# Patient Record
Sex: Female | Born: 1950
Health system: Southern US, Community
[De-identification: ages and names within clinical notes are randomized; demographics above are authoritative.]

## PROBLEM LIST (undated history)

## (undated) DIAGNOSIS — E785 Hyperlipidemia, unspecified: Secondary | ICD-10-CM

## (undated) DIAGNOSIS — E119 Type 2 diabetes mellitus without complications: Secondary | ICD-10-CM

## (undated) DIAGNOSIS — I639 Cerebral infarction, unspecified: Secondary | ICD-10-CM

## (undated) DIAGNOSIS — I8290 Acute embolism and thrombosis of unspecified vein: Secondary | ICD-10-CM

## (undated) DIAGNOSIS — D649 Anemia, unspecified: Secondary | ICD-10-CM

## (undated) DIAGNOSIS — N179 Acute kidney failure, unspecified: Secondary | ICD-10-CM

## (undated) DIAGNOSIS — I10 Essential (primary) hypertension: Secondary | ICD-10-CM

## (undated) DIAGNOSIS — Z905 Acquired absence of kidney: Secondary | ICD-10-CM

## (undated) DIAGNOSIS — J449 Chronic obstructive pulmonary disease, unspecified: Secondary | ICD-10-CM

## (undated) DIAGNOSIS — M1990 Unspecified osteoarthritis, unspecified site: Secondary | ICD-10-CM

## (undated) DIAGNOSIS — Z720 Tobacco use: Secondary | ICD-10-CM

## (undated) DIAGNOSIS — J189 Pneumonia, unspecified organism: Secondary | ICD-10-CM

## (undated) DIAGNOSIS — N289 Disorder of kidney and ureter, unspecified: Secondary | ICD-10-CM

## (undated) DIAGNOSIS — K219 Gastro-esophageal reflux disease without esophagitis: Secondary | ICD-10-CM

## (undated) HISTORY — PX: OTHER SURGICAL HISTORY: SHX169

## (undated) HISTORY — PX: CHOLECYSTECTOMY: SHX55

## (undated) HISTORY — DX: Hyperlipidemia, unspecified: E78.5

## (undated) HISTORY — PX: NEPHRECTOMY RECIPIENT: SUR879

## (undated) HISTORY — PX: ABDOMINAL HYSTERECTOMY: SHX81

## (undated) HISTORY — PX: COLONOSCOPY: SHX174

---

## 1999-09-05 ENCOUNTER — Encounter: Payer: Self-pay | Admitting: Emergency Medicine

## 1999-09-05 ENCOUNTER — Emergency Department (HOSPITAL_COMMUNITY): Admission: EM | Admit: 1999-09-05 | Discharge: 1999-09-05 | Payer: Self-pay | Admitting: Emergency Medicine

## 1999-09-09 ENCOUNTER — Other Ambulatory Visit: Admission: RE | Admit: 1999-09-09 | Discharge: 1999-09-09 | Payer: Self-pay | Admitting: Obstetrics

## 1999-09-10 ENCOUNTER — Other Ambulatory Visit: Admission: RE | Admit: 1999-09-10 | Discharge: 1999-09-10 | Payer: Self-pay | Admitting: Obstetrics

## 1999-10-19 ENCOUNTER — Inpatient Hospital Stay (HOSPITAL_COMMUNITY): Admission: RE | Admit: 1999-10-19 | Discharge: 1999-10-21 | Payer: Self-pay | Admitting: Obstetrics

## 1999-10-25 ENCOUNTER — Inpatient Hospital Stay (HOSPITAL_COMMUNITY): Admission: AD | Admit: 1999-10-25 | Discharge: 1999-10-25 | Payer: Self-pay | Admitting: Obstetrics

## 1999-10-25 ENCOUNTER — Encounter: Payer: Self-pay | Admitting: Obstetrics

## 2002-08-27 ENCOUNTER — Encounter: Admission: RE | Admit: 2002-08-27 | Discharge: 2002-11-25 | Payer: Self-pay | Admitting: Internal Medicine

## 2003-05-29 ENCOUNTER — Ambulatory Visit (HOSPITAL_COMMUNITY): Admission: RE | Admit: 2003-05-29 | Discharge: 2003-05-29 | Payer: Self-pay | Admitting: Orthopedic Surgery

## 2003-05-29 ENCOUNTER — Ambulatory Visit (HOSPITAL_BASED_OUTPATIENT_CLINIC_OR_DEPARTMENT_OTHER): Admission: RE | Admit: 2003-05-29 | Discharge: 2003-05-29 | Payer: Self-pay | Admitting: Orthopedic Surgery

## 2003-07-31 ENCOUNTER — Ambulatory Visit (HOSPITAL_COMMUNITY): Admission: RE | Admit: 2003-07-31 | Discharge: 2003-07-31 | Payer: Self-pay | Admitting: Orthopedic Surgery

## 2003-07-31 ENCOUNTER — Ambulatory Visit (HOSPITAL_BASED_OUTPATIENT_CLINIC_OR_DEPARTMENT_OTHER): Admission: RE | Admit: 2003-07-31 | Discharge: 2003-07-31 | Payer: Self-pay | Admitting: Orthopedic Surgery

## 2003-11-05 ENCOUNTER — Other Ambulatory Visit: Admission: RE | Admit: 2003-11-05 | Discharge: 2003-11-05 | Payer: Self-pay | Admitting: Family Medicine

## 2004-01-16 ENCOUNTER — Encounter: Admission: RE | Admit: 2004-01-16 | Discharge: 2004-01-16 | Payer: Self-pay | Admitting: Gastroenterology

## 2004-04-02 ENCOUNTER — Ambulatory Visit (HOSPITAL_COMMUNITY): Admission: RE | Admit: 2004-04-02 | Discharge: 2004-04-03 | Payer: Self-pay | Admitting: General Surgery

## 2014-03-10 ENCOUNTER — Emergency Department (HOSPITAL_COMMUNITY)
Admission: EM | Admit: 2014-03-10 | Discharge: 2014-03-10 | Disposition: A | Discharge status: Home / Self Care | Payer: Medicare Other | Attending: Emergency Medicine | Admitting: Emergency Medicine

## 2014-03-10 ENCOUNTER — Encounter (HOSPITAL_COMMUNITY): Payer: Self-pay | Admitting: Emergency Medicine

## 2014-03-10 DIAGNOSIS — R944 Abnormal results of kidney function studies: Secondary | ICD-10-CM | POA: Diagnosis not present

## 2014-03-10 DIAGNOSIS — Z862 Personal history of diseases of the blood and blood-forming organs and certain disorders involving the immune mechanism: Secondary | ICD-10-CM | POA: Diagnosis not present

## 2014-03-10 DIAGNOSIS — Z905 Acquired absence of kidney: Secondary | ICD-10-CM | POA: Insufficient documentation

## 2014-03-10 DIAGNOSIS — I129 Hypertensive chronic kidney disease with stage 1 through stage 4 chronic kidney disease, or unspecified chronic kidney disease: Secondary | ICD-10-CM | POA: Insufficient documentation

## 2014-03-10 DIAGNOSIS — R899 Unspecified abnormal finding in specimens from other organs, systems and tissues: Secondary | ICD-10-CM

## 2014-03-10 DIAGNOSIS — Z720 Tobacco use: Secondary | ICD-10-CM | POA: Insufficient documentation

## 2014-03-10 DIAGNOSIS — N189 Chronic kidney disease, unspecified: Secondary | ICD-10-CM | POA: Insufficient documentation

## 2014-03-10 DIAGNOSIS — E119 Type 2 diabetes mellitus without complications: Secondary | ICD-10-CM | POA: Diagnosis not present

## 2014-03-10 DIAGNOSIS — Z8739 Personal history of other diseases of the musculoskeletal system and connective tissue: Secondary | ICD-10-CM | POA: Insufficient documentation

## 2014-03-10 HISTORY — DX: Disorder of kidney and ureter, unspecified: N28.9

## 2014-03-10 HISTORY — DX: Essential (primary) hypertension: I10

## 2014-03-10 HISTORY — DX: Unspecified osteoarthritis, unspecified site: M19.90

## 2014-03-10 HISTORY — DX: Acute kidney failure, unspecified: N17.9

## 2014-03-10 HISTORY — DX: Acute embolism and thrombosis of unspecified vein: I82.90

## 2014-03-10 HISTORY — DX: Acquired absence of kidney: Z90.5

## 2014-03-10 HISTORY — DX: Type 2 diabetes mellitus without complications: E11.9

## 2014-03-10 HISTORY — DX: Hypercalcemia: E83.52

## 2014-03-10 LAB — CBC WITH DIFFERENTIAL/PLATELET
Basophils Absolute: 0 10*3/uL (ref 0.0–0.1)
Basophils Relative: 0 % (ref 0–1)
Eosinophils Absolute: 0.2 10*3/uL (ref 0.0–0.7)
Eosinophils Relative: 2 % (ref 0–5)
HCT: 30.3 % — ABNORMAL LOW (ref 36.0–46.0)
Hemoglobin: 9 g/dL — ABNORMAL LOW (ref 12.0–15.0)
Lymphocytes Relative: 9 % — ABNORMAL LOW (ref 12–46)
Lymphs Abs: 1.1 10*3/uL (ref 0.7–4.0)
MCH: 24.2 pg — ABNORMAL LOW (ref 26.0–34.0)
MCHC: 29.7 g/dL — ABNORMAL LOW (ref 30.0–36.0)
MCV: 81.5 fL (ref 78.0–100.0)
Monocytes Absolute: 0.8 10*3/uL (ref 0.1–1.0)
Monocytes Relative: 7 % (ref 3–12)
Neutro Abs: 10 10*3/uL — ABNORMAL HIGH (ref 1.7–7.7)
Neutrophils Relative %: 82 % — ABNORMAL HIGH (ref 43–77)
Platelets: 326 10*3/uL (ref 150–400)
RBC: 3.72 MIL/uL — ABNORMAL LOW (ref 3.87–5.11)
RDW: 18.4 % — ABNORMAL HIGH (ref 11.5–15.5)
WBC: 12.1 10*3/uL — ABNORMAL HIGH (ref 4.0–10.5)

## 2014-03-10 LAB — BASIC METABOLIC PANEL
Anion gap: 16 — ABNORMAL HIGH (ref 5–15)
BUN: 17 mg/dL (ref 6–23)
CO2: 20 mEq/L (ref 19–32)
Calcium: 10.1 mg/dL (ref 8.4–10.5)
Chloride: 104 mEq/L (ref 96–112)
Creatinine, Ser: 1.11 mg/dL — ABNORMAL HIGH (ref 0.50–1.10)
GFR calc Af Amer: 59 mL/min — ABNORMAL LOW (ref >90)
GFR calc non Af Amer: 51 mL/min — ABNORMAL LOW (ref >90)
Glucose, Bld: 170 mg/dL — ABNORMAL HIGH (ref 70–99)
Potassium: 4.6 mEq/L (ref 3.7–5.3)
Sodium: 140 mEq/L (ref 137–147)

## 2014-03-10 MED ORDER — SODIUM CHLORIDE 0.9 % IV SOLN
INTRAVENOUS | Status: DC
  Administered 2014-03-10: 10 mL/h via INTRAVENOUS

## 2014-03-10 MED ORDER — HYDROCODONE-ACETAMINOPHEN 5-325 MG PO TABS
1.0000 | ORAL_TABLET | Freq: Once | ORAL | Status: AC
  Administered 2014-03-10: 1 via ORAL
  Filled 2014-03-10: qty 1

## 2014-03-10 NOTE — ED Notes (Signed)
Pt's sister called doctor's office and found that potassium was 6.9 and Ionized calcium was 1.38

## 2014-03-10 NOTE — ED Notes (Signed)
Pt states that she is from [place].  Has one kidney, CKD, and htn.  States that her doctor in [place] called Friday and left a message, told her that she needed to go immediately to the ER d/t hyperkalemia.  Pt states that she just got the message this morning.

## 2014-03-10 NOTE — ED Provider Notes (Signed)
CSN: [id number]     Arrival date & time 03/10/14  1026 History   First MD Initiated Contact with Patient 03/10/14 1104     Chief Complaint  Patient presents with  . Abnormal Lab     (Consider location/radiation/quality/duration/timing/severity/associated sxs/prior Treatment) HPI Comments: Patient here complaining of having an abnormal laboratory tests. Was noted to have an elevated potassium at 6.9 according to her. She does have a history of chronic kidney disease. He denies any dyspnea. No chest pain or cough or shortness of breath. Denies any chest pain or abdominal pain. Feels at her baseline at this time.  The history is provided by a relative and the patient.    Past Medical History  Diagnosis Date  . Hypertension   . Renal disorder   . Arthritis   . Single kidney   . Hereditary protein C deficiency   . Hypercalcemia   . Acute renal failure   . Diabetes mellitus without complication   . Thrombosis    Past Surgical History  Procedure Laterality Date  . Nephrectomy recipient    . Abdominal hysterectomy    . Cholecystectomy     History reviewed. No pertinent family history. History  Substance Use Topics  . Smoking status: Current Every Day Smoker  . Smokeless tobacco: Not on file  . Alcohol Use: No   OB History   Grav Para Term Preterm Abortions TAB SAB Ect Mult Living                 Review of Systems  All other systems reviewed and are negative.     Allergies  Review of patient's allergies indicates no known allergies.  Home Medications   Prior to Admission medications   Not on File   BP 186/70  Pulse 81  Temp(Src) 99 F (37.2 C) (Oral)  Resp 18  SpO2 100% Physical Exam  Nursing note and vitals reviewed. Constitutional: She is oriented to person, place, and time. She appears well-developed and well-nourished.  Non-toxic appearance. No distress.  HENT:  Head: Normocephalic and atraumatic.  Eyes: Conjunctivae, EOM and lids are normal. Pupils  are equal, round, and reactive to light.  Neck: Normal range of motion. Neck supple. No tracheal deviation present. No mass present.  Cardiovascular: Normal rate, regular rhythm and normal heart sounds.  Exam reveals no gallop.   No murmur heard. Pulmonary/Chest: Effort normal and breath sounds normal. No stridor. No respiratory distress. She has no decreased breath sounds. She has no wheezes. She has no rhonchi. She has no rales.  Abdominal: Soft. Normal appearance and bowel sounds are normal. She exhibits no distension. There is no tenderness. There is no rebound and no CVA tenderness.  Musculoskeletal: Normal range of motion. She exhibits no edema and no tenderness.  Neurological: She is alert and oriented to person, place, and time. She has normal strength. No cranial nerve deficit or sensory deficit. GCS eye subscore is 4. GCS verbal subscore is 5. GCS motor subscore is 6.  Skin: Skin is warm and dry. No abrasion and no rash noted.  Psychiatric: She has a normal mood and affect. Her speech is normal and behavior is normal.    ED Course  Procedures (including critical care time) Labs Review Labs Reviewed  CBC WITH DIFFERENTIAL  BASIC METABOLIC PANEL    Imaging Review No results found.   EKG Interpretation   Date/Time:  Monday March 10 2014 11:24:21 EDT Ventricular Rate:  75 PR Interval:  114 QRS Duration: 82  QT Interval:  418 QTC Calculation: 467 R Axis:   49 Text Interpretation:  Sinus rhythm Borderline short PR interval Probable  anteroseptal infarct, recent No significant change since last tracing  Confirmed by Arney Mayabb  MD, Zosia Lucchese ([id number]) on 03/10/2014 11:35:46 AM      MDM   Final diagnoses:  None   Patient's potassium here is normal. Creatinine is slightly elevated at 1.11. She is asymptomatic at this time. Stable for discharge     Leota Jacobsen, MD 03/10/14 1418

## 2014-03-10 NOTE — Discharge Instructions (Signed)
Followup with your doctor as needed

## 2015-06-02 DIAGNOSIS — M461 Sacroiliitis, not elsewhere classified: Secondary | ICD-10-CM | POA: Diagnosis not present

## 2015-06-02 DIAGNOSIS — Z79891 Long term (current) use of opiate analgesic: Secondary | ICD-10-CM | POA: Diagnosis not present

## 2015-06-02 DIAGNOSIS — E7800 Pure hypercholesterolemia, unspecified: Secondary | ICD-10-CM | POA: Diagnosis not present

## 2015-06-02 DIAGNOSIS — Z8679 Personal history of other diseases of the circulatory system: Secondary | ICD-10-CM | POA: Diagnosis not present

## 2015-06-02 DIAGNOSIS — I5189 Other ill-defined heart diseases: Secondary | ICD-10-CM | POA: Diagnosis not present

## 2015-06-03 DIAGNOSIS — Z87891 Personal history of nicotine dependence: Secondary | ICD-10-CM | POA: Diagnosis not present

## 2015-06-03 DIAGNOSIS — D509 Iron deficiency anemia, unspecified: Secondary | ICD-10-CM | POA: Diagnosis not present

## 2015-06-03 DIAGNOSIS — D6859 Other primary thrombophilia: Secondary | ICD-10-CM | POA: Diagnosis not present

## 2015-06-05 DIAGNOSIS — D6859 Other primary thrombophilia: Secondary | ICD-10-CM | POA: Diagnosis not present

## 2015-06-05 DIAGNOSIS — N183 Chronic kidney disease, stage 3 (moderate): Secondary | ICD-10-CM | POA: Diagnosis not present

## 2015-06-05 DIAGNOSIS — D649 Anemia, unspecified: Secondary | ICD-10-CM | POA: Diagnosis not present

## 2015-06-05 DIAGNOSIS — I129 Hypertensive chronic kidney disease with stage 1 through stage 4 chronic kidney disease, or unspecified chronic kidney disease: Secondary | ICD-10-CM | POA: Diagnosis not present

## 2015-06-05 DIAGNOSIS — Z86718 Personal history of other venous thrombosis and embolism: Secondary | ICD-10-CM | POA: Diagnosis not present

## 2015-06-05 DIAGNOSIS — E1129 Type 2 diabetes mellitus with other diabetic kidney complication: Secondary | ICD-10-CM | POA: Diagnosis not present

## 2015-06-19 DIAGNOSIS — E119 Type 2 diabetes mellitus without complications: Secondary | ICD-10-CM | POA: Diagnosis not present

## 2015-06-19 DIAGNOSIS — Z7984 Long term (current) use of oral hypoglycemic drugs: Secondary | ICD-10-CM | POA: Diagnosis not present

## 2015-06-19 DIAGNOSIS — H26493 Other secondary cataract, bilateral: Secondary | ICD-10-CM | POA: Diagnosis not present

## 2015-06-19 DIAGNOSIS — Z961 Presence of intraocular lens: Secondary | ICD-10-CM | POA: Diagnosis not present

## 2015-07-01 DIAGNOSIS — E0789 Other specified disorders of thyroid: Secondary | ICD-10-CM | POA: Diagnosis not present

## 2015-07-01 DIAGNOSIS — E7800 Pure hypercholesterolemia, unspecified: Secondary | ICD-10-CM | POA: Diagnosis not present

## 2015-07-01 DIAGNOSIS — I5189 Other ill-defined heart diseases: Secondary | ICD-10-CM | POA: Diagnosis not present

## 2015-07-01 DIAGNOSIS — Z79891 Long term (current) use of opiate analgesic: Secondary | ICD-10-CM | POA: Diagnosis not present

## 2015-07-01 DIAGNOSIS — M461 Sacroiliitis, not elsewhere classified: Secondary | ICD-10-CM | POA: Diagnosis not present

## 2015-07-22 DIAGNOSIS — I70213 Atherosclerosis of native arteries of extremities with intermittent claudication, bilateral legs: Secondary | ICD-10-CM | POA: Diagnosis not present

## 2015-07-22 DIAGNOSIS — I70209 Unspecified atherosclerosis of native arteries of extremities, unspecified extremity: Secondary | ICD-10-CM | POA: Diagnosis not present

## 2015-07-22 DIAGNOSIS — I70203 Unspecified atherosclerosis of native arteries of extremities, bilateral legs: Secondary | ICD-10-CM | POA: Diagnosis not present

## 2015-07-29 DIAGNOSIS — M545 Low back pain: Secondary | ICD-10-CM | POA: Diagnosis not present

## 2015-07-29 DIAGNOSIS — M47816 Spondylosis without myelopathy or radiculopathy, lumbar region: Secondary | ICD-10-CM | POA: Diagnosis not present

## 2015-07-29 DIAGNOSIS — Z79891 Long term (current) use of opiate analgesic: Secondary | ICD-10-CM | POA: Diagnosis not present

## 2015-08-05 DIAGNOSIS — E1149 Type 2 diabetes mellitus with other diabetic neurological complication: Secondary | ICD-10-CM | POA: Diagnosis not present

## 2015-08-05 DIAGNOSIS — Z713 Dietary counseling and surveillance: Secondary | ICD-10-CM | POA: Diagnosis not present

## 2015-08-05 DIAGNOSIS — E782 Mixed hyperlipidemia: Secondary | ICD-10-CM | POA: Diagnosis not present

## 2015-08-05 DIAGNOSIS — Z6831 Body mass index (BMI) 31.0-31.9, adult: Secondary | ICD-10-CM | POA: Diagnosis not present

## 2015-08-26 DIAGNOSIS — M47817 Spondylosis without myelopathy or radiculopathy, lumbosacral region: Secondary | ICD-10-CM | POA: Diagnosis not present

## 2015-08-26 DIAGNOSIS — I5189 Other ill-defined heart diseases: Secondary | ICD-10-CM | POA: Diagnosis not present

## 2015-08-26 DIAGNOSIS — E0789 Other specified disorders of thyroid: Secondary | ICD-10-CM | POA: Diagnosis not present

## 2015-08-26 DIAGNOSIS — E118 Type 2 diabetes mellitus with unspecified complications: Secondary | ICD-10-CM | POA: Diagnosis not present

## 2015-08-26 DIAGNOSIS — M545 Low back pain: Secondary | ICD-10-CM | POA: Diagnosis not present

## 2015-08-26 DIAGNOSIS — M47816 Spondylosis without myelopathy or radiculopathy, lumbar region: Secondary | ICD-10-CM | POA: Diagnosis not present

## 2015-08-26 DIAGNOSIS — E7800 Pure hypercholesterolemia, unspecified: Secondary | ICD-10-CM | POA: Diagnosis not present

## 2015-10-28 DIAGNOSIS — Z79891 Long term (current) use of opiate analgesic: Secondary | ICD-10-CM | POA: Diagnosis not present

## 2015-10-28 DIAGNOSIS — M545 Low back pain: Secondary | ICD-10-CM | POA: Diagnosis not present

## 2015-10-28 DIAGNOSIS — M47897 Other spondylosis, lumbosacral region: Secondary | ICD-10-CM | POA: Diagnosis not present

## 2015-10-28 DIAGNOSIS — M461 Sacroiliitis, not elsewhere classified: Secondary | ICD-10-CM | POA: Diagnosis not present

## 2015-10-29 DIAGNOSIS — I701 Atherosclerosis of renal artery: Secondary | ICD-10-CM | POA: Diagnosis not present

## 2015-10-29 DIAGNOSIS — N280 Ischemia and infarction of kidney: Secondary | ICD-10-CM | POA: Diagnosis not present

## 2015-10-29 DIAGNOSIS — I70203 Unspecified atherosclerosis of native arteries of extremities, bilateral legs: Secondary | ICD-10-CM | POA: Diagnosis not present

## 2015-10-30 DIAGNOSIS — E1122 Type 2 diabetes mellitus with diabetic chronic kidney disease: Secondary | ICD-10-CM | POA: Diagnosis present

## 2015-10-30 DIAGNOSIS — R0789 Other chest pain: Secondary | ICD-10-CM | POA: Diagnosis not present

## 2015-10-30 DIAGNOSIS — Z716 Tobacco abuse counseling: Secondary | ICD-10-CM | POA: Diagnosis not present

## 2015-10-30 DIAGNOSIS — F17210 Nicotine dependence, cigarettes, uncomplicated: Secondary | ICD-10-CM | POA: Diagnosis present

## 2015-10-30 DIAGNOSIS — R209 Unspecified disturbances of skin sensation: Secondary | ICD-10-CM | POA: Diagnosis not present

## 2015-10-30 DIAGNOSIS — D6859 Other primary thrombophilia: Secondary | ICD-10-CM | POA: Diagnosis present

## 2015-10-30 DIAGNOSIS — R200 Anesthesia of skin: Secondary | ICD-10-CM | POA: Diagnosis present

## 2015-10-30 DIAGNOSIS — E782 Mixed hyperlipidemia: Secondary | ICD-10-CM | POA: Diagnosis present

## 2015-10-30 DIAGNOSIS — N183 Chronic kidney disease, stage 3 (moderate): Secondary | ICD-10-CM | POA: Diagnosis present

## 2015-10-30 DIAGNOSIS — Z794 Long term (current) use of insulin: Secondary | ICD-10-CM | POA: Diagnosis not present

## 2015-10-30 DIAGNOSIS — R51 Headache: Secondary | ICD-10-CM | POA: Diagnosis not present

## 2015-10-30 DIAGNOSIS — R9431 Abnormal electrocardiogram [ECG] [EKG]: Secondary | ICD-10-CM | POA: Diagnosis not present

## 2015-10-30 DIAGNOSIS — I361 Nonrheumatic tricuspid (valve) insufficiency: Secondary | ICD-10-CM | POA: Diagnosis not present

## 2015-10-30 DIAGNOSIS — M79602 Pain in left arm: Secondary | ICD-10-CM | POA: Diagnosis present

## 2015-10-30 DIAGNOSIS — I10 Essential (primary) hypertension: Secondary | ICD-10-CM | POA: Diagnosis not present

## 2015-10-30 DIAGNOSIS — M4802 Spinal stenosis, cervical region: Secondary | ICD-10-CM | POA: Diagnosis not present

## 2015-10-30 DIAGNOSIS — R079 Chest pain, unspecified: Secondary | ICD-10-CM | POA: Diagnosis not present

## 2015-10-30 DIAGNOSIS — M545 Low back pain: Secondary | ICD-10-CM | POA: Diagnosis present

## 2015-10-30 DIAGNOSIS — M50321 Other cervical disc degeneration at C4-C5 level: Secondary | ICD-10-CM | POA: Diagnosis not present

## 2015-10-30 DIAGNOSIS — F329 Major depressive disorder, single episode, unspecified: Secondary | ICD-10-CM | POA: Diagnosis present

## 2015-10-30 DIAGNOSIS — Z0000 Encounter for general adult medical examination without abnormal findings: Secondary | ICD-10-CM | POA: Diagnosis not present

## 2015-10-30 DIAGNOSIS — G8929 Other chronic pain: Secondary | ICD-10-CM | POA: Diagnosis present

## 2015-10-30 DIAGNOSIS — Z79899 Other long term (current) drug therapy: Secondary | ICD-10-CM | POA: Diagnosis not present

## 2015-10-30 DIAGNOSIS — E1140 Type 2 diabetes mellitus with diabetic neuropathy, unspecified: Secondary | ICD-10-CM | POA: Diagnosis not present

## 2015-10-30 DIAGNOSIS — I129 Hypertensive chronic kidney disease with stage 1 through stage 4 chronic kidney disease, or unspecified chronic kidney disease: Secondary | ICD-10-CM | POA: Diagnosis present

## 2015-10-31 DIAGNOSIS — R51 Headache: Secondary | ICD-10-CM | POA: Diagnosis not present

## 2015-10-31 DIAGNOSIS — F329 Major depressive disorder, single episode, unspecified: Secondary | ICD-10-CM | POA: Diagnosis not present

## 2015-10-31 DIAGNOSIS — E1122 Type 2 diabetes mellitus with diabetic chronic kidney disease: Secondary | ICD-10-CM | POA: Diagnosis not present

## 2015-10-31 DIAGNOSIS — R0789 Other chest pain: Secondary | ICD-10-CM | POA: Diagnosis not present

## 2015-10-31 DIAGNOSIS — E1140 Type 2 diabetes mellitus with diabetic neuropathy, unspecified: Secondary | ICD-10-CM | POA: Diagnosis not present

## 2015-10-31 DIAGNOSIS — N183 Chronic kidney disease, stage 3 (moderate): Secondary | ICD-10-CM | POA: Diagnosis not present

## 2015-10-31 DIAGNOSIS — D6859 Other primary thrombophilia: Secondary | ICD-10-CM | POA: Diagnosis not present

## 2015-11-04 DIAGNOSIS — D649 Anemia, unspecified: Secondary | ICD-10-CM | POA: Diagnosis not present

## 2015-11-04 DIAGNOSIS — D500 Iron deficiency anemia secondary to blood loss (chronic): Secondary | ICD-10-CM | POA: Diagnosis not present

## 2015-11-04 DIAGNOSIS — I129 Hypertensive chronic kidney disease with stage 1 through stage 4 chronic kidney disease, or unspecified chronic kidney disease: Secondary | ICD-10-CM | POA: Diagnosis not present

## 2015-11-04 DIAGNOSIS — D6859 Other primary thrombophilia: Secondary | ICD-10-CM | POA: Diagnosis not present

## 2015-11-04 DIAGNOSIS — R200 Anesthesia of skin: Secondary | ICD-10-CM | POA: Diagnosis not present

## 2015-11-04 DIAGNOSIS — N183 Chronic kidney disease, stage 3 (moderate): Secondary | ICD-10-CM | POA: Diagnosis not present

## 2015-11-04 DIAGNOSIS — I10 Essential (primary) hypertension: Secondary | ICD-10-CM | POA: Diagnosis not present

## 2015-11-04 DIAGNOSIS — R0789 Other chest pain: Secondary | ICD-10-CM | POA: Diagnosis not present

## 2015-11-04 DIAGNOSIS — E1129 Type 2 diabetes mellitus with other diabetic kidney complication: Secondary | ICD-10-CM | POA: Diagnosis not present

## 2015-11-04 DIAGNOSIS — R079 Chest pain, unspecified: Secondary | ICD-10-CM | POA: Diagnosis not present

## 2015-11-04 DIAGNOSIS — Z1231 Encounter for screening mammogram for malignant neoplasm of breast: Secondary | ICD-10-CM | POA: Diagnosis not present

## 2015-11-04 DIAGNOSIS — Z86718 Personal history of other venous thrombosis and embolism: Secondary | ICD-10-CM | POA: Diagnosis not present

## 2015-11-12 DIAGNOSIS — K2961 Other gastritis with bleeding: Secondary | ICD-10-CM | POA: Diagnosis not present

## 2015-11-12 DIAGNOSIS — I890 Lymphedema, not elsewhere classified: Secondary | ICD-10-CM | POA: Diagnosis not present

## 2015-11-12 DIAGNOSIS — D1330 Benign neoplasm of unspecified part of small intestine: Secondary | ICD-10-CM | POA: Diagnosis not present

## 2015-11-12 DIAGNOSIS — Q2733 Arteriovenous malformation of digestive system vessel: Secondary | ICD-10-CM | POA: Diagnosis not present

## 2015-11-12 DIAGNOSIS — D500 Iron deficiency anemia secondary to blood loss (chronic): Secondary | ICD-10-CM | POA: Diagnosis not present

## 2015-11-12 DIAGNOSIS — R300 Dysuria: Secondary | ICD-10-CM | POA: Diagnosis not present

## 2015-11-18 DIAGNOSIS — Z1231 Encounter for screening mammogram for malignant neoplasm of breast: Secondary | ICD-10-CM | POA: Diagnosis not present

## 2015-11-18 DIAGNOSIS — Z87891 Personal history of nicotine dependence: Secondary | ICD-10-CM | POA: Diagnosis not present

## 2015-11-18 DIAGNOSIS — D509 Iron deficiency anemia, unspecified: Secondary | ICD-10-CM | POA: Diagnosis not present

## 2015-11-18 DIAGNOSIS — D6859 Other primary thrombophilia: Secondary | ICD-10-CM | POA: Diagnosis not present

## 2015-11-25 DIAGNOSIS — D509 Iron deficiency anemia, unspecified: Secondary | ICD-10-CM | POA: Diagnosis not present

## 2015-11-26 DIAGNOSIS — K6389 Other specified diseases of intestine: Secondary | ICD-10-CM | POA: Diagnosis not present

## 2015-11-26 DIAGNOSIS — D500 Iron deficiency anemia secondary to blood loss (chronic): Secondary | ICD-10-CM | POA: Diagnosis not present

## 2015-12-09 DIAGNOSIS — E1149 Type 2 diabetes mellitus with other diabetic neurological complication: Secondary | ICD-10-CM | POA: Diagnosis not present

## 2015-12-09 DIAGNOSIS — E782 Mixed hyperlipidemia: Secondary | ICD-10-CM | POA: Diagnosis not present

## 2015-12-09 DIAGNOSIS — Z713 Dietary counseling and surveillance: Secondary | ICD-10-CM | POA: Diagnosis not present

## 2016-01-04 DIAGNOSIS — M461 Sacroiliitis, not elsewhere classified: Secondary | ICD-10-CM | POA: Diagnosis not present

## 2016-01-04 DIAGNOSIS — M47897 Other spondylosis, lumbosacral region: Secondary | ICD-10-CM | POA: Diagnosis not present

## 2016-01-04 DIAGNOSIS — M5416 Radiculopathy, lumbar region: Secondary | ICD-10-CM | POA: Diagnosis not present

## 2016-01-04 DIAGNOSIS — Z79891 Long term (current) use of opiate analgesic: Secondary | ICD-10-CM | POA: Diagnosis not present

## 2016-01-14 DIAGNOSIS — Z23 Encounter for immunization: Secondary | ICD-10-CM | POA: Diagnosis not present

## 2016-01-14 DIAGNOSIS — M5116 Intervertebral disc disorders with radiculopathy, lumbar region: Secondary | ICD-10-CM | POA: Diagnosis not present

## 2016-01-14 DIAGNOSIS — M4726 Other spondylosis with radiculopathy, lumbar region: Secondary | ICD-10-CM | POA: Diagnosis not present

## 2016-01-14 DIAGNOSIS — M5416 Radiculopathy, lumbar region: Secondary | ICD-10-CM | POA: Diagnosis not present

## 2016-01-26 DIAGNOSIS — N183 Chronic kidney disease, stage 3 (moderate): Secondary | ICD-10-CM | POA: Diagnosis not present

## 2016-01-27 DIAGNOSIS — M5136 Other intervertebral disc degeneration, lumbar region: Secondary | ICD-10-CM | POA: Diagnosis not present

## 2016-01-27 DIAGNOSIS — M4806 Spinal stenosis, lumbar region: Secondary | ICD-10-CM | POA: Diagnosis not present

## 2016-01-27 DIAGNOSIS — I70213 Atherosclerosis of native arteries of extremities with intermittent claudication, bilateral legs: Secondary | ICD-10-CM | POA: Diagnosis not present

## 2016-01-27 DIAGNOSIS — M5416 Radiculopathy, lumbar region: Secondary | ICD-10-CM | POA: Diagnosis not present

## 2016-01-27 DIAGNOSIS — Z79891 Long term (current) use of opiate analgesic: Secondary | ICD-10-CM | POA: Diagnosis not present

## 2016-01-29 DIAGNOSIS — N183 Chronic kidney disease, stage 3 (moderate): Secondary | ICD-10-CM | POA: Diagnosis not present

## 2016-01-29 DIAGNOSIS — I129 Hypertensive chronic kidney disease with stage 1 through stage 4 chronic kidney disease, or unspecified chronic kidney disease: Secondary | ICD-10-CM | POA: Diagnosis not present

## 2016-01-29 DIAGNOSIS — D6859 Other primary thrombophilia: Secondary | ICD-10-CM | POA: Diagnosis not present

## 2016-01-29 DIAGNOSIS — E1129 Type 2 diabetes mellitus with other diabetic kidney complication: Secondary | ICD-10-CM | POA: Diagnosis not present

## 2016-01-29 DIAGNOSIS — D649 Anemia, unspecified: Secondary | ICD-10-CM | POA: Diagnosis not present

## 2016-01-29 DIAGNOSIS — Z86718 Personal history of other venous thrombosis and embolism: Secondary | ICD-10-CM | POA: Diagnosis not present

## 2016-02-09 DIAGNOSIS — K219 Gastro-esophageal reflux disease without esophagitis: Secondary | ICD-10-CM | POA: Diagnosis not present

## 2016-02-09 DIAGNOSIS — Z905 Acquired absence of kidney: Secondary | ICD-10-CM | POA: Diagnosis not present

## 2016-02-09 DIAGNOSIS — G8929 Other chronic pain: Secondary | ICD-10-CM | POA: Diagnosis not present

## 2016-02-09 DIAGNOSIS — Z79891 Long term (current) use of opiate analgesic: Secondary | ICD-10-CM | POA: Diagnosis not present

## 2016-02-09 DIAGNOSIS — K31819 Angiodysplasia of stomach and duodenum without bleeding: Secondary | ICD-10-CM | POA: Diagnosis not present

## 2016-02-09 DIAGNOSIS — I129 Hypertensive chronic kidney disease with stage 1 through stage 4 chronic kidney disease, or unspecified chronic kidney disease: Secondary | ICD-10-CM | POA: Diagnosis not present

## 2016-02-09 DIAGNOSIS — Z79899 Other long term (current) drug therapy: Secondary | ICD-10-CM | POA: Diagnosis not present

## 2016-02-09 DIAGNOSIS — Z6831 Body mass index (BMI) 31.0-31.9, adult: Secondary | ICD-10-CM | POA: Diagnosis not present

## 2016-02-09 DIAGNOSIS — E1142 Type 2 diabetes mellitus with diabetic polyneuropathy: Secondary | ICD-10-CM | POA: Diagnosis not present

## 2016-02-09 DIAGNOSIS — N189 Chronic kidney disease, unspecified: Secondary | ICD-10-CM | POA: Diagnosis not present

## 2016-02-09 DIAGNOSIS — Z794 Long term (current) use of insulin: Secondary | ICD-10-CM | POA: Diagnosis not present

## 2016-02-09 DIAGNOSIS — Z91041 Radiographic dye allergy status: Secondary | ICD-10-CM | POA: Diagnosis not present

## 2016-02-09 DIAGNOSIS — Q2733 Arteriovenous malformation of digestive system vessel: Secondary | ICD-10-CM | POA: Diagnosis not present

## 2016-02-09 DIAGNOSIS — Z8679 Personal history of other diseases of the circulatory system: Secondary | ICD-10-CM | POA: Diagnosis not present

## 2016-02-09 DIAGNOSIS — D649 Anemia, unspecified: Secondary | ICD-10-CM | POA: Diagnosis not present

## 2016-02-09 DIAGNOSIS — I739 Peripheral vascular disease, unspecified: Secondary | ICD-10-CM | POA: Diagnosis not present

## 2016-02-09 DIAGNOSIS — M4800 Spinal stenosis, site unspecified: Secondary | ICD-10-CM | POA: Diagnosis not present

## 2016-02-09 DIAGNOSIS — E1122 Type 2 diabetes mellitus with diabetic chronic kidney disease: Secondary | ICD-10-CM | POA: Diagnosis not present

## 2016-02-09 DIAGNOSIS — D509 Iron deficiency anemia, unspecified: Secondary | ICD-10-CM | POA: Diagnosis not present

## 2016-02-09 DIAGNOSIS — D6859 Other primary thrombophilia: Secondary | ICD-10-CM | POA: Diagnosis not present

## 2016-03-22 DIAGNOSIS — M545 Low back pain: Secondary | ICD-10-CM | POA: Diagnosis not present

## 2016-03-22 DIAGNOSIS — M5416 Radiculopathy, lumbar region: Secondary | ICD-10-CM | POA: Diagnosis not present

## 2016-03-22 DIAGNOSIS — Z79891 Long term (current) use of opiate analgesic: Secondary | ICD-10-CM | POA: Diagnosis not present

## 2016-03-22 DIAGNOSIS — M5417 Radiculopathy, lumbosacral region: Secondary | ICD-10-CM | POA: Diagnosis not present

## 2016-04-26 DIAGNOSIS — I70213 Atherosclerosis of native arteries of extremities with intermittent claudication, bilateral legs: Secondary | ICD-10-CM | POA: Diagnosis not present

## 2016-05-04 DIAGNOSIS — D649 Anemia, unspecified: Secondary | ICD-10-CM | POA: Diagnosis not present

## 2016-05-04 DIAGNOSIS — Z87891 Personal history of nicotine dependence: Secondary | ICD-10-CM | POA: Diagnosis not present

## 2016-05-04 DIAGNOSIS — D6859 Other primary thrombophilia: Secondary | ICD-10-CM | POA: Diagnosis not present

## 2016-05-04 DIAGNOSIS — D509 Iron deficiency anemia, unspecified: Secondary | ICD-10-CM | POA: Diagnosis not present

## 2016-05-13 DIAGNOSIS — M545 Low back pain: Secondary | ICD-10-CM | POA: Diagnosis not present

## 2016-05-13 DIAGNOSIS — Z79891 Long term (current) use of opiate analgesic: Secondary | ICD-10-CM | POA: Diagnosis not present

## 2016-05-13 DIAGNOSIS — M5417 Radiculopathy, lumbosacral region: Secondary | ICD-10-CM | POA: Diagnosis not present

## 2016-05-19 DIAGNOSIS — D509 Iron deficiency anemia, unspecified: Secondary | ICD-10-CM | POA: Diagnosis not present

## 2016-06-07 DIAGNOSIS — N183 Chronic kidney disease, stage 3 (moderate): Secondary | ICD-10-CM | POA: Diagnosis not present

## 2016-06-10 DIAGNOSIS — I129 Hypertensive chronic kidney disease with stage 1 through stage 4 chronic kidney disease, or unspecified chronic kidney disease: Secondary | ICD-10-CM | POA: Diagnosis not present

## 2016-06-10 DIAGNOSIS — N183 Chronic kidney disease, stage 3 (moderate): Secondary | ICD-10-CM | POA: Diagnosis not present

## 2016-06-10 DIAGNOSIS — D649 Anemia, unspecified: Secondary | ICD-10-CM | POA: Diagnosis not present

## 2016-06-10 DIAGNOSIS — E1129 Type 2 diabetes mellitus with other diabetic kidney complication: Secondary | ICD-10-CM | POA: Diagnosis not present

## 2016-06-10 DIAGNOSIS — D6859 Other primary thrombophilia: Secondary | ICD-10-CM | POA: Diagnosis not present

## 2016-06-13 DIAGNOSIS — Z79891 Long term (current) use of opiate analgesic: Secondary | ICD-10-CM | POA: Diagnosis not present

## 2016-06-13 DIAGNOSIS — M5417 Radiculopathy, lumbosacral region: Secondary | ICD-10-CM | POA: Diagnosis not present

## 2016-06-13 DIAGNOSIS — M47897 Other spondylosis, lumbosacral region: Secondary | ICD-10-CM | POA: Diagnosis not present

## 2016-06-30 DIAGNOSIS — D6859 Other primary thrombophilia: Secondary | ICD-10-CM | POA: Diagnosis not present

## 2016-06-30 DIAGNOSIS — D649 Anemia, unspecified: Secondary | ICD-10-CM | POA: Diagnosis not present

## 2016-06-30 DIAGNOSIS — Z87891 Personal history of nicotine dependence: Secondary | ICD-10-CM | POA: Diagnosis not present

## 2016-06-30 DIAGNOSIS — D509 Iron deficiency anemia, unspecified: Secondary | ICD-10-CM | POA: Diagnosis not present

## 2016-07-11 DIAGNOSIS — M47897 Other spondylosis, lumbosacral region: Secondary | ICD-10-CM | POA: Diagnosis not present

## 2016-07-11 DIAGNOSIS — M5417 Radiculopathy, lumbosacral region: Secondary | ICD-10-CM | POA: Diagnosis not present

## 2016-07-11 DIAGNOSIS — Z79891 Long term (current) use of opiate analgesic: Secondary | ICD-10-CM | POA: Diagnosis not present

## 2016-08-02 DIAGNOSIS — N183 Chronic kidney disease, stage 3 (moderate): Secondary | ICD-10-CM | POA: Diagnosis not present

## 2016-08-05 DIAGNOSIS — D649 Anemia, unspecified: Secondary | ICD-10-CM | POA: Diagnosis not present

## 2016-08-05 DIAGNOSIS — N179 Acute kidney failure, unspecified: Secondary | ICD-10-CM | POA: Diagnosis not present

## 2016-08-05 DIAGNOSIS — E1129 Type 2 diabetes mellitus with other diabetic kidney complication: Secondary | ICD-10-CM | POA: Diagnosis not present

## 2016-08-05 DIAGNOSIS — I129 Hypertensive chronic kidney disease with stage 1 through stage 4 chronic kidney disease, or unspecified chronic kidney disease: Secondary | ICD-10-CM | POA: Diagnosis not present

## 2016-08-05 DIAGNOSIS — D6859 Other primary thrombophilia: Secondary | ICD-10-CM | POA: Diagnosis not present

## 2016-08-05 DIAGNOSIS — N183 Chronic kidney disease, stage 3 (moderate): Secondary | ICD-10-CM | POA: Diagnosis not present

## 2016-08-08 DIAGNOSIS — Z79891 Long term (current) use of opiate analgesic: Secondary | ICD-10-CM | POA: Diagnosis not present

## 2016-08-08 DIAGNOSIS — M47897 Other spondylosis, lumbosacral region: Secondary | ICD-10-CM | POA: Diagnosis not present

## 2016-08-17 DIAGNOSIS — Z713 Dietary counseling and surveillance: Secondary | ICD-10-CM | POA: Diagnosis not present

## 2016-08-17 DIAGNOSIS — E782 Mixed hyperlipidemia: Secondary | ICD-10-CM | POA: Diagnosis not present

## 2016-08-17 DIAGNOSIS — E1149 Type 2 diabetes mellitus with other diabetic neurological complication: Secondary | ICD-10-CM | POA: Diagnosis not present

## 2016-09-19 DIAGNOSIS — E1140 Type 2 diabetes mellitus with diabetic neuropathy, unspecified: Secondary | ICD-10-CM | POA: Diagnosis not present

## 2016-09-19 DIAGNOSIS — M47897 Other spondylosis, lumbosacral region: Secondary | ICD-10-CM | POA: Diagnosis not present

## 2016-09-28 DIAGNOSIS — Z79891 Long term (current) use of opiate analgesic: Secondary | ICD-10-CM | POA: Diagnosis not present

## 2016-09-28 DIAGNOSIS — M47816 Spondylosis without myelopathy or radiculopathy, lumbar region: Secondary | ICD-10-CM | POA: Diagnosis not present

## 2016-09-28 DIAGNOSIS — F17218 Nicotine dependence, cigarettes, with other nicotine-induced disorders: Secondary | ICD-10-CM | POA: Diagnosis not present

## 2016-09-28 DIAGNOSIS — M5136 Other intervertebral disc degeneration, lumbar region: Secondary | ICD-10-CM | POA: Diagnosis not present

## 2016-09-28 DIAGNOSIS — M47817 Spondylosis without myelopathy or radiculopathy, lumbosacral region: Secondary | ICD-10-CM | POA: Diagnosis not present

## 2016-09-28 DIAGNOSIS — E118 Type 2 diabetes mellitus with unspecified complications: Secondary | ICD-10-CM | POA: Diagnosis not present

## 2016-09-28 DIAGNOSIS — M47897 Other spondylosis, lumbosacral region: Secondary | ICD-10-CM | POA: Diagnosis not present

## 2016-09-28 DIAGNOSIS — I10 Essential (primary) hypertension: Secondary | ICD-10-CM | POA: Diagnosis not present

## 2016-09-28 DIAGNOSIS — K219 Gastro-esophageal reflux disease without esophagitis: Secondary | ICD-10-CM | POA: Diagnosis not present

## 2016-10-05 DIAGNOSIS — D508 Other iron deficiency anemias: Secondary | ICD-10-CM | POA: Diagnosis not present

## 2016-10-05 DIAGNOSIS — Z86010 Personal history of colonic polyps: Secondary | ICD-10-CM

## 2016-10-05 DIAGNOSIS — E1149 Type 2 diabetes mellitus with other diabetic neurological complication: Secondary | ICD-10-CM | POA: Diagnosis not present

## 2016-10-05 DIAGNOSIS — Z0000 Encounter for general adult medical examination without abnormal findings: Secondary | ICD-10-CM | POA: Diagnosis not present

## 2016-10-05 DIAGNOSIS — Z1231 Encounter for screening mammogram for malignant neoplasm of breast: Secondary | ICD-10-CM | POA: Diagnosis not present

## 2016-10-05 DIAGNOSIS — I10 Essential (primary) hypertension: Secondary | ICD-10-CM | POA: Diagnosis not present

## 2016-10-05 DIAGNOSIS — N183 Chronic kidney disease, stage 3 (moderate): Secondary | ICD-10-CM | POA: Diagnosis not present

## 2016-10-05 DIAGNOSIS — E782 Mixed hyperlipidemia: Secondary | ICD-10-CM | POA: Diagnosis not present

## 2016-10-19 DIAGNOSIS — M47817 Spondylosis without myelopathy or radiculopathy, lumbosacral region: Secondary | ICD-10-CM | POA: Diagnosis not present

## 2016-10-19 DIAGNOSIS — M47816 Spondylosis without myelopathy or radiculopathy, lumbar region: Secondary | ICD-10-CM | POA: Diagnosis not present

## 2016-10-31 DIAGNOSIS — D509 Iron deficiency anemia, unspecified: Secondary | ICD-10-CM | POA: Diagnosis not present

## 2016-10-31 DIAGNOSIS — D6859 Other primary thrombophilia: Secondary | ICD-10-CM | POA: Diagnosis not present

## 2016-11-03 DIAGNOSIS — R946 Abnormal results of thyroid function studies: Secondary | ICD-10-CM | POA: Diagnosis not present

## 2016-11-18 DIAGNOSIS — H26493 Other secondary cataract, bilateral: Secondary | ICD-10-CM | POA: Diagnosis not present

## 2016-11-18 DIAGNOSIS — Z7984 Long term (current) use of oral hypoglycemic drugs: Secondary | ICD-10-CM | POA: Diagnosis not present

## 2016-11-18 DIAGNOSIS — E119 Type 2 diabetes mellitus without complications: Secondary | ICD-10-CM | POA: Diagnosis not present

## 2016-11-18 DIAGNOSIS — Z961 Presence of intraocular lens: Secondary | ICD-10-CM | POA: Diagnosis not present

## 2016-11-18 DIAGNOSIS — H524 Presbyopia: Secondary | ICD-10-CM | POA: Diagnosis not present

## 2016-11-21 DIAGNOSIS — E042 Nontoxic multinodular goiter: Secondary | ICD-10-CM | POA: Diagnosis not present

## 2016-11-21 DIAGNOSIS — Z1231 Encounter for screening mammogram for malignant neoplasm of breast: Secondary | ICD-10-CM | POA: Diagnosis not present

## 2016-11-21 DIAGNOSIS — E039 Hypothyroidism, unspecified: Secondary | ICD-10-CM | POA: Diagnosis not present

## 2016-11-22 DIAGNOSIS — D509 Iron deficiency anemia, unspecified: Secondary | ICD-10-CM | POA: Diagnosis not present

## 2016-11-23 DIAGNOSIS — M47817 Spondylosis without myelopathy or radiculopathy, lumbosacral region: Secondary | ICD-10-CM | POA: Diagnosis not present

## 2016-11-23 DIAGNOSIS — Z79891 Long term (current) use of opiate analgesic: Secondary | ICD-10-CM | POA: Diagnosis not present

## 2016-11-23 DIAGNOSIS — M47816 Spondylosis without myelopathy or radiculopathy, lumbar region: Secondary | ICD-10-CM | POA: Diagnosis not present

## 2016-11-29 DIAGNOSIS — D509 Iron deficiency anemia, unspecified: Secondary | ICD-10-CM | POA: Diagnosis not present

## 2016-12-02 DIAGNOSIS — H26491 Other secondary cataract, right eye: Secondary | ICD-10-CM | POA: Diagnosis not present

## 2016-12-02 DIAGNOSIS — Z961 Presence of intraocular lens: Secondary | ICD-10-CM | POA: Diagnosis not present

## 2016-12-05 DIAGNOSIS — Z87891 Personal history of nicotine dependence: Secondary | ICD-10-CM | POA: Diagnosis not present

## 2016-12-05 DIAGNOSIS — D6859 Other primary thrombophilia: Secondary | ICD-10-CM | POA: Diagnosis not present

## 2016-12-05 DIAGNOSIS — D509 Iron deficiency anemia, unspecified: Secondary | ICD-10-CM | POA: Diagnosis not present

## 2016-12-05 DIAGNOSIS — D649 Anemia, unspecified: Secondary | ICD-10-CM | POA: Diagnosis not present

## 2016-12-07 DIAGNOSIS — E039 Hypothyroidism, unspecified: Secondary | ICD-10-CM | POA: Diagnosis not present

## 2016-12-07 DIAGNOSIS — E042 Nontoxic multinodular goiter: Secondary | ICD-10-CM | POA: Diagnosis not present

## 2016-12-09 DIAGNOSIS — Z961 Presence of intraocular lens: Secondary | ICD-10-CM | POA: Diagnosis not present

## 2016-12-09 DIAGNOSIS — H26492 Other secondary cataract, left eye: Secondary | ICD-10-CM | POA: Diagnosis not present

## 2016-12-19 DIAGNOSIS — M545 Low back pain: Secondary | ICD-10-CM | POA: Diagnosis not present

## 2016-12-19 DIAGNOSIS — Z79891 Long term (current) use of opiate analgesic: Secondary | ICD-10-CM | POA: Diagnosis not present

## 2017-03-01 DIAGNOSIS — Z794 Long term (current) use of insulin: Secondary | ICD-10-CM | POA: Diagnosis not present

## 2017-03-01 DIAGNOSIS — I739 Peripheral vascular disease, unspecified: Secondary | ICD-10-CM | POA: Diagnosis not present

## 2017-03-01 DIAGNOSIS — E039 Hypothyroidism, unspecified: Secondary | ICD-10-CM | POA: Diagnosis not present

## 2017-03-01 DIAGNOSIS — F17210 Nicotine dependence, cigarettes, uncomplicated: Secondary | ICD-10-CM | POA: Diagnosis not present

## 2017-03-01 DIAGNOSIS — I8290 Acute embolism and thrombosis of unspecified vein: Secondary | ICD-10-CM | POA: Diagnosis not present

## 2017-03-01 DIAGNOSIS — Q2733 Arteriovenous malformation of digestive system vessel: Secondary | ICD-10-CM | POA: Diagnosis not present

## 2017-03-01 DIAGNOSIS — Z23 Encounter for immunization: Secondary | ICD-10-CM | POA: Diagnosis not present

## 2017-03-01 DIAGNOSIS — I10 Essential (primary) hypertension: Secondary | ICD-10-CM | POA: Diagnosis not present

## 2017-03-01 DIAGNOSIS — E1140 Type 2 diabetes mellitus with diabetic neuropathy, unspecified: Secondary | ICD-10-CM | POA: Diagnosis not present

## 2017-03-01 DIAGNOSIS — G8929 Other chronic pain: Secondary | ICD-10-CM | POA: Diagnosis not present

## 2017-03-01 DIAGNOSIS — Z1389 Encounter for screening for other disorder: Secondary | ICD-10-CM | POA: Diagnosis not present

## 2017-03-01 DIAGNOSIS — D649 Anemia, unspecified: Secondary | ICD-10-CM | POA: Diagnosis not present

## 2017-03-01 DIAGNOSIS — K219 Gastro-esophageal reflux disease without esophagitis: Secondary | ICD-10-CM | POA: Diagnosis not present

## 2017-03-03 ENCOUNTER — Telehealth: Payer: Self-pay | Admitting: Hematology and Oncology

## 2017-03-03 NOTE — Telephone Encounter (Signed)
Appt has been scheduled for the pt to see Dr. Lebron Conners on 10/23 at 2pm. Pt aware to arrive 30 minutes early.

## 2017-03-07 ENCOUNTER — Ambulatory Visit (HOSPITAL_BASED_OUTPATIENT_CLINIC_OR_DEPARTMENT_OTHER): Payer: Medicare Other

## 2017-03-07 ENCOUNTER — Ambulatory Visit (HOSPITAL_BASED_OUTPATIENT_CLINIC_OR_DEPARTMENT_OTHER): Payer: Medicare Other | Admitting: Hematology and Oncology

## 2017-03-07 ENCOUNTER — Telehealth: Payer: Self-pay | Admitting: Hematology and Oncology

## 2017-03-07 VITALS — BP 164/63 | HR 84 | Temp 98.9°F | Resp 18 | Ht 65.5 in | Wt 192.3 lb

## 2017-03-07 DIAGNOSIS — Q2733 Arteriovenous malformation of digestive system vessel: Secondary | ICD-10-CM

## 2017-03-07 DIAGNOSIS — D500 Iron deficiency anemia secondary to blood loss (chronic): Secondary | ICD-10-CM | POA: Diagnosis not present

## 2017-03-07 DIAGNOSIS — K5520 Angiodysplasia of colon without hemorrhage: Secondary | ICD-10-CM | POA: Insufficient documentation

## 2017-03-07 DIAGNOSIS — D688 Other specified coagulation defects: Secondary | ICD-10-CM

## 2017-03-07 DIAGNOSIS — D6851 Activated protein C resistance: Secondary | ICD-10-CM

## 2017-03-07 LAB — CBC & DIFF AND RETIC
BASO%: 0.4 % (ref 0.0–2.0)
Basophils Absolute: 0.1 10*3/uL (ref 0.0–0.1)
EOS%: 1.9 % (ref 0.0–7.0)
Eosinophils Absolute: 0.2 10*3/uL (ref 0.0–0.5)
HCT: 29 % — ABNORMAL LOW (ref 34.8–46.6)
HGB: 8.2 g/dL — ABNORMAL LOW (ref 11.6–15.9)
Immature Retic Fract: 30.8 % — ABNORMAL HIGH (ref 1.60–10.00)
LYMPH%: 9.6 % — ABNORMAL LOW (ref 14.0–49.7)
MCH: 21 pg — ABNORMAL LOW (ref 25.1–34.0)
MCHC: 28.3 g/dL — ABNORMAL LOW (ref 31.5–36.0)
MCV: 74.4 fL — ABNORMAL LOW (ref 79.5–101.0)
MONO#: 0.8 10*3/uL (ref 0.1–0.9)
MONO%: 6.6 % (ref 0.0–14.0)
NEUT#: 9.7 10*3/uL — ABNORMAL HIGH (ref 1.5–6.5)
NEUT%: 81.5 % — ABNORMAL HIGH (ref 38.4–76.8)
Platelets: 254 10*3/uL (ref 145–400)
RBC: 3.9 10*6/uL (ref 3.70–5.45)
RDW: 17.4 % — ABNORMAL HIGH (ref 11.2–14.5)
Retic %: 3.59 % — ABNORMAL HIGH (ref 0.70–2.10)
Retic Ct Abs: 140.01 10*3/uL — ABNORMAL HIGH (ref 33.70–90.70)
WBC: 11.9 10*3/uL — ABNORMAL HIGH (ref 3.9–10.3)
lymph#: 1.1 10*3/uL (ref 0.9–3.3)

## 2017-03-07 LAB — MORPHOLOGY: PLT EST: 240

## 2017-03-07 LAB — COMPREHENSIVE METABOLIC PANEL
ALT: 12 U/L (ref 0–55)
AST: 13 U/L (ref 5–34)
Albumin: 3.8 g/dL (ref 3.5–5.0)
Alkaline Phosphatase: 146 U/L (ref 40–150)
Anion Gap: 12 mEq/L — ABNORMAL HIGH (ref 3–11)
BUN: 16.7 mg/dL (ref 7.0–26.0)
CO2: 23 mEq/L (ref 22–29)
Calcium: 10 mg/dL (ref 8.4–10.4)
Chloride: 104 mEq/L (ref 98–109)
Creatinine: 1.3 mg/dL — ABNORMAL HIGH (ref 0.6–1.1)
EGFR: 48 mL/min/{1.73_m2} — ABNORMAL LOW (ref >60)
Glucose: 218 mg/dl — ABNORMAL HIGH (ref 70–140)
Potassium: 3.9 mEq/L (ref 3.5–5.1)
Sodium: 139 mEq/L (ref 136–145)
Total Bilirubin: 0.25 mg/dL (ref 0.20–1.20)
Total Protein: 7.3 g/dL (ref 6.4–8.3)

## 2017-03-07 NOTE — Telephone Encounter (Signed)
Gave avs and calendar for October  °

## 2017-03-08 LAB — LUPUS ANTICOAGULANT PANEL
PTT-LA: 27.6 s (ref 0.0–51.9)
dRVVT: 32.8 s (ref 0.0–47.0)

## 2017-03-08 LAB — IRON AND TIBC
%SAT: 4 % — ABNORMAL LOW (ref 21–57)
Iron: 18 ug/dL — ABNORMAL LOW (ref 41–142)
TIBC: 453 ug/dL — ABNORMAL HIGH (ref 236–444)
UIBC: 435 ug/dL — ABNORMAL HIGH (ref 120–384)

## 2017-03-08 LAB — FERRITIN: Ferritin: 15 ng/ml (ref 9–269)

## 2017-03-08 LAB — ANTITHROMBIN III: Antithrombin Activity: 127 % (ref 75–135)

## 2017-03-08 LAB — PROTEIN S, TOTAL: Protein S, Total: 92 % (ref 60–150)

## 2017-03-08 LAB — PROTEIN S ACTIVITY: Protein S-Functional: 101 % (ref 63–140)

## 2017-03-08 LAB — PROTEIN S, ANTIGEN, FREE: Protein S, Free: 129 % (ref 57–157)

## 2017-03-08 LAB — VITAMIN B12: Vitamin B12: 783 pg/mL (ref 232–1245)

## 2017-03-08 LAB — PROTEIN C ACTIVITY: Protein C-Functional: 119 % (ref 73–180)

## 2017-03-08 LAB — ANTITHROMBIN III ANTIGEN: AT III AG PPP IMM-ACNC: 106 % (ref 72–124)

## 2017-03-08 LAB — LACTATE DEHYDROGENASE: LDH: 147 U/L (ref 125–245)

## 2017-03-08 LAB — FOLATE: Folate: 9.3 ng/mL (ref >3.0)

## 2017-03-09 LAB — BETA-2-GLYCOPROTEIN I ABS, IGG/M/A
Beta-2 Glyco 1 IgA: <9 GPI IgA units (ref 0–25)
Beta-2 Glyco 1 IgM: <9 GPI IgM units (ref 0–32)
Beta-2 Glycoprotein I Ab, IgG: <9 GPI IgG units (ref 0–20)

## 2017-03-09 LAB — CARDIOLIPIN ANTIBODIES, IGG, IGM, IGA
Anticardiolipin Ab,IgA,Qn: <9 APL U/mL (ref 0–11)
Anticardiolipin Ab,IgG,Qn: <9 GPL U/mL (ref 0–14)
Anticardiolipin Ab,IgM,Qn: <9 MPL U/mL (ref 0–12)

## 2017-03-09 LAB — PROTEIN C, TOTAL: Protein C Antigen: 99 % (ref 60–150)

## 2017-03-13 ENCOUNTER — Encounter (HOSPITAL_COMMUNITY): Payer: Self-pay | Admitting: Emergency Medicine

## 2017-03-13 ENCOUNTER — Inpatient Hospital Stay (HOSPITAL_COMMUNITY)
Admission: EM | Admit: 2017-03-13 | Discharge: 2017-03-17 | DRG: 193 | Disposition: A | Discharge status: Home / Self Care | Payer: Medicare Other | Attending: Internal Medicine | Admitting: Internal Medicine

## 2017-03-13 ENCOUNTER — Emergency Department (HOSPITAL_COMMUNITY): Payer: Medicare Other

## 2017-03-13 ENCOUNTER — Telehealth: Payer: Self-pay

## 2017-03-13 ENCOUNTER — Encounter: Payer: Self-pay | Admitting: Hematology and Oncology

## 2017-03-13 DIAGNOSIS — Z833 Family history of diabetes mellitus: Secondary | ICD-10-CM | POA: Diagnosis not present

## 2017-03-13 DIAGNOSIS — J189 Pneumonia, unspecified organism: Principal | ICD-10-CM

## 2017-03-13 DIAGNOSIS — F329 Major depressive disorder, single episode, unspecified: Secondary | ICD-10-CM | POA: Diagnosis present

## 2017-03-13 DIAGNOSIS — I10 Essential (primary) hypertension: Secondary | ICD-10-CM | POA: Diagnosis present

## 2017-03-13 DIAGNOSIS — J441 Chronic obstructive pulmonary disease with (acute) exacerbation: Secondary | ICD-10-CM | POA: Diagnosis not present

## 2017-03-13 DIAGNOSIS — Z8249 Family history of ischemic heart disease and other diseases of the circulatory system: Secondary | ICD-10-CM | POA: Diagnosis not present

## 2017-03-13 DIAGNOSIS — Z79890 Hormone replacement therapy (postmenopausal): Secondary | ICD-10-CM

## 2017-03-13 DIAGNOSIS — E1149 Type 2 diabetes mellitus with other diabetic neurological complication: Secondary | ICD-10-CM | POA: Diagnosis present

## 2017-03-13 DIAGNOSIS — E782 Mixed hyperlipidemia: Secondary | ICD-10-CM | POA: Diagnosis present

## 2017-03-13 DIAGNOSIS — E1122 Type 2 diabetes mellitus with diabetic chronic kidney disease: Secondary | ICD-10-CM | POA: Diagnosis present

## 2017-03-13 DIAGNOSIS — Z79899 Other long term (current) drug therapy: Secondary | ICD-10-CM | POA: Diagnosis not present

## 2017-03-13 DIAGNOSIS — J449 Chronic obstructive pulmonary disease, unspecified: Secondary | ICD-10-CM

## 2017-03-13 DIAGNOSIS — Q2733 Arteriovenous malformation of digestive system vessel: Secondary | ICD-10-CM | POA: Diagnosis not present

## 2017-03-13 DIAGNOSIS — E039 Hypothyroidism, unspecified: Secondary | ICD-10-CM | POA: Diagnosis present

## 2017-03-13 DIAGNOSIS — Z90710 Acquired absence of both cervix and uterus: Secondary | ICD-10-CM | POA: Diagnosis not present

## 2017-03-13 DIAGNOSIS — Z905 Acquired absence of kidney: Secondary | ICD-10-CM

## 2017-03-13 DIAGNOSIS — G8929 Other chronic pain: Secondary | ICD-10-CM | POA: Diagnosis present

## 2017-03-13 DIAGNOSIS — D500 Iron deficiency anemia secondary to blood loss (chronic): Secondary | ICD-10-CM | POA: Diagnosis present

## 2017-03-13 DIAGNOSIS — D6859 Other primary thrombophilia: Secondary | ICD-10-CM | POA: Diagnosis present

## 2017-03-13 DIAGNOSIS — K5520 Angiodysplasia of colon without hemorrhage: Secondary | ICD-10-CM | POA: Diagnosis present

## 2017-03-13 DIAGNOSIS — R519 Headache, unspecified: Secondary | ICD-10-CM | POA: Diagnosis present

## 2017-03-13 DIAGNOSIS — Z86718 Personal history of other venous thrombosis and embolism: Secondary | ICD-10-CM | POA: Diagnosis not present

## 2017-03-13 DIAGNOSIS — D649 Anemia, unspecified: Secondary | ICD-10-CM

## 2017-03-13 DIAGNOSIS — Z9049 Acquired absence of other specified parts of digestive tract: Secondary | ICD-10-CM

## 2017-03-13 DIAGNOSIS — R05 Cough: Secondary | ICD-10-CM | POA: Diagnosis not present

## 2017-03-13 DIAGNOSIS — M7989 Other specified soft tissue disorders: Secondary | ICD-10-CM | POA: Diagnosis not present

## 2017-03-13 DIAGNOSIS — E118 Type 2 diabetes mellitus with unspecified complications: Secondary | ICD-10-CM | POA: Diagnosis not present

## 2017-03-13 DIAGNOSIS — F17200 Nicotine dependence, unspecified, uncomplicated: Secondary | ICD-10-CM | POA: Diagnosis present

## 2017-03-13 DIAGNOSIS — D72829 Elevated white blood cell count, unspecified: Secondary | ICD-10-CM | POA: Diagnosis not present

## 2017-03-13 DIAGNOSIS — Z794 Long term (current) use of insulin: Secondary | ICD-10-CM

## 2017-03-13 DIAGNOSIS — R0602 Shortness of breath: Secondary | ICD-10-CM | POA: Diagnosis not present

## 2017-03-13 DIAGNOSIS — I129 Hypertensive chronic kidney disease with stage 1 through stage 4 chronic kidney disease, or unspecified chronic kidney disease: Secondary | ICD-10-CM | POA: Diagnosis present

## 2017-03-13 DIAGNOSIS — J9601 Acute respiratory failure with hypoxia: Secondary | ICD-10-CM | POA: Diagnosis present

## 2017-03-13 DIAGNOSIS — J440 Chronic obstructive pulmonary disease with acute lower respiratory infection: Secondary | ICD-10-CM | POA: Diagnosis present

## 2017-03-13 DIAGNOSIS — K219 Gastro-esophageal reflux disease without esophagitis: Secondary | ICD-10-CM | POA: Diagnosis present

## 2017-03-13 DIAGNOSIS — Z91041 Radiographic dye allergy status: Secondary | ICD-10-CM

## 2017-03-13 DIAGNOSIS — N183 Chronic kidney disease, stage 3 unspecified: Secondary | ICD-10-CM | POA: Diagnosis present

## 2017-03-13 DIAGNOSIS — Z86010 Personal history of colonic polyps: Secondary | ICD-10-CM

## 2017-03-13 DIAGNOSIS — R51 Headache: Secondary | ICD-10-CM

## 2017-03-13 DIAGNOSIS — J208 Acute bronchitis due to other specified organisms: Secondary | ICD-10-CM | POA: Diagnosis not present

## 2017-03-13 DIAGNOSIS — T380X5A Adverse effect of glucocorticoids and synthetic analogues, initial encounter: Secondary | ICD-10-CM | POA: Diagnosis not present

## 2017-03-13 DIAGNOSIS — E669 Obesity, unspecified: Secondary | ICD-10-CM | POA: Diagnosis present

## 2017-03-13 DIAGNOSIS — Z6831 Body mass index (BMI) 31.0-31.9, adult: Secondary | ICD-10-CM

## 2017-03-13 DIAGNOSIS — F32A Depression, unspecified: Secondary | ICD-10-CM | POA: Diagnosis present

## 2017-03-13 LAB — COMPREHENSIVE METABOLIC PANEL
ALT: 17 U/L (ref 14–54)
AST: 22 U/L (ref 15–41)
Albumin: 3.4 g/dL — ABNORMAL LOW (ref 3.5–5.0)
Alkaline Phosphatase: 122 U/L (ref 38–126)
Anion gap: 12 (ref 5–15)
BUN: 12 mg/dL (ref 6–20)
CO2: 23 mmol/L (ref 22–32)
Calcium: 9.5 mg/dL (ref 8.9–10.3)
Chloride: 102 mmol/L (ref 101–111)
Creatinine, Ser: 1 mg/dL (ref 0.44–1.00)
GFR calc Af Amer: >60 mL/min (ref >60)
GFR calc non Af Amer: 57 mL/min — ABNORMAL LOW (ref >60)
Glucose, Bld: 179 mg/dL — ABNORMAL HIGH (ref 65–99)
Potassium: 4.1 mmol/L (ref 3.5–5.1)
Sodium: 137 mmol/L (ref 135–145)
Total Bilirubin: 0.4 mg/dL (ref 0.3–1.2)
Total Protein: 6.9 g/dL (ref 6.5–8.1)

## 2017-03-13 LAB — CBC
HCT: 22.9 % — ABNORMAL LOW (ref 36.0–46.0)
Hemoglobin: 6.5 g/dL — CL (ref 12.0–15.0)
MCH: 20.3 pg — ABNORMAL LOW (ref 26.0–34.0)
MCHC: 28.4 g/dL — ABNORMAL LOW (ref 30.0–36.0)
MCV: 71.6 fL — ABNORMAL LOW (ref 78.0–100.0)
Platelets: 279 10*3/uL (ref 150–400)
RBC: 3.2 MIL/uL — ABNORMAL LOW (ref 3.87–5.11)
RDW: 17.8 % — ABNORMAL HIGH (ref 11.5–15.5)
WBC: 9.9 10*3/uL (ref 4.0–10.5)

## 2017-03-13 LAB — CBC WITH DIFFERENTIAL/PLATELET
Basophils Absolute: 0 10*3/uL (ref 0.0–0.1)
Basophils Relative: 0 %
Eosinophils Absolute: 0.4 10*3/uL (ref 0.0–0.7)
Eosinophils Relative: 3 %
HCT: 25.1 % — ABNORMAL LOW (ref 36.0–46.0)
Hemoglobin: 7 g/dL — ABNORMAL LOW (ref 12.0–15.0)
Lymphocytes Relative: 9 %
Lymphs Abs: 1.1 10*3/uL (ref 0.7–4.0)
MCH: 20.1 pg — ABNORMAL LOW (ref 26.0–34.0)
MCHC: 27.9 g/dL — ABNORMAL LOW (ref 30.0–36.0)
MCV: 71.9 fL — ABNORMAL LOW (ref 78.0–100.0)
Monocytes Absolute: 0.9 10*3/uL (ref 0.1–1.0)
Monocytes Relative: 8 %
Neutro Abs: 9.3 10*3/uL — ABNORMAL HIGH (ref 1.7–7.7)
Neutrophils Relative %: 80 %
Platelets: 265 10*3/uL (ref 150–400)
RBC: 3.49 MIL/uL — ABNORMAL LOW (ref 3.87–5.11)
RDW: 17.2 % — ABNORMAL HIGH (ref 11.5–15.5)
WBC: 11.7 10*3/uL — ABNORMAL HIGH (ref 4.0–10.5)

## 2017-03-13 LAB — HEMOGLOBIN A1C
Hgb A1c MFr Bld: 6.6 % — ABNORMAL HIGH (ref 4.8–5.6)
Mean Plasma Glucose: 142.72 mg/dL

## 2017-03-13 LAB — EXPECTORATED SPUTUM ASSESSMENT W GRAM STAIN, RFLX TO RESP C

## 2017-03-13 LAB — CBG MONITORING, ED: Glucose-Capillary: 204 mg/dL — ABNORMAL HIGH (ref 65–99)

## 2017-03-13 LAB — ABO/RH: ABO/RH(D): B POS

## 2017-03-13 LAB — PROCALCITONIN: Procalcitonin: 0.1 ng/mL

## 2017-03-13 LAB — GLUCOSE, CAPILLARY: Glucose-Capillary: 337 mg/dL — ABNORMAL HIGH (ref 65–99)

## 2017-03-13 LAB — TROPONIN I: Troponin I: <0.03 ng/mL (ref <0.03)

## 2017-03-13 LAB — STREP PNEUMONIAE URINARY ANTIGEN: Strep Pneumo Urinary Antigen: NEGATIVE

## 2017-03-13 LAB — LACTIC ACID, PLASMA: Lactic Acid, Venous: 1.7 mmol/L (ref 0.5–1.9)

## 2017-03-13 LAB — PREPARE RBC (CROSSMATCH)

## 2017-03-13 LAB — BRAIN NATRIURETIC PEPTIDE: B Natriuretic Peptide: 211.5 pg/mL — ABNORMAL HIGH (ref 0.0–100.0)

## 2017-03-13 LAB — INFLUENZA PANEL BY PCR (TYPE A & B)
Influenza A By PCR: NEGATIVE
Influenza B By PCR: NEGATIVE

## 2017-03-13 LAB — TSH: TSH: 2.25 u[IU]/mL (ref 0.350–4.500)

## 2017-03-13 LAB — D-DIMER, QUANTITATIVE: D-Dimer, Quant: 0.59 ug/mL-FEU — ABNORMAL HIGH (ref 0.00–0.50)

## 2017-03-13 MED ORDER — INSULIN ASPART 100 UNIT/ML ~~LOC~~ SOLN
0.0000 [IU] | Freq: Every day | SUBCUTANEOUS | Status: DC
  Administered 2017-03-13: 5 [IU] via SUBCUTANEOUS

## 2017-03-13 MED ORDER — INSULIN GLARGINE 100 UNIT/ML ~~LOC~~ SOLN
54.0000 [IU] | Freq: Every day | SUBCUTANEOUS | Status: DC
  Administered 2017-03-13 – 2017-03-14 (×2): 54 [IU] via SUBCUTANEOUS
  Filled 2017-03-13 (×3): qty 0.54

## 2017-03-13 MED ORDER — ALBUTEROL (5 MG/ML) CONTINUOUS INHALATION SOLN
10.0000 mg/h | INHALATION_SOLUTION | Freq: Once | RESPIRATORY_TRACT | Status: AC
  Administered 2017-03-13: 10 mg/h via RESPIRATORY_TRACT
  Filled 2017-03-13: qty 0.5

## 2017-03-13 MED ORDER — BISACODYL 10 MG RE SUPP: 10.0000 mg | Freq: Every day | RECTAL | Status: DC | PRN

## 2017-03-13 MED ORDER — PANTOPRAZOLE SODIUM 40 MG PO TBEC
40.0000 mg | DELAYED_RELEASE_TABLET | Freq: Every day | ORAL | Status: DC
  Administered 2017-03-13 – 2017-03-17 (×5): 40 mg via ORAL
  Filled 2017-03-13 (×5): qty 1

## 2017-03-13 MED ORDER — ONDANSETRON HCL 4 MG PO TABS: 4.0000 mg | ORAL_TABLET | Freq: Four times a day (QID) | ORAL | Status: DC | PRN

## 2017-03-13 MED ORDER — SODIUM CHLORIDE 0.9 % IV SOLN: 10.0000 mL/h | Freq: Once | INTRAVENOUS | Status: DC

## 2017-03-13 MED ORDER — ONDANSETRON HCL 4 MG/2ML IJ SOLN: 4.0000 mg | Freq: Four times a day (QID) | INTRAMUSCULAR | Status: DC | PRN

## 2017-03-13 MED ORDER — DEXTROSE 5 % IV SOLN
1.0000 g | INTRAVENOUS | Status: DC
  Administered 2017-03-13 – 2017-03-16 (×4): 1 g via INTRAVENOUS
  Filled 2017-03-13 (×4): qty 10

## 2017-03-13 MED ORDER — ACETAMINOPHEN 325 MG PO TABS: 650.0000 mg | ORAL_TABLET | Freq: Four times a day (QID) | ORAL | Status: DC | PRN

## 2017-03-13 MED ORDER — HYDROCOD POLST-CPM POLST ER 10-8 MG/5ML PO SUER
5.0000 mL | Freq: Once | ORAL | Status: AC
  Administered 2017-03-13: 5 mL via ORAL
  Filled 2017-03-13: qty 5

## 2017-03-13 MED ORDER — IPRATROPIUM-ALBUTEROL 0.5-2.5 (3) MG/3ML IN SOLN
3.0000 mL | Freq: Two times a day (BID) | RESPIRATORY_TRACT | Status: DC
Start: 2017-03-14 — End: 2017-03-14
  Administered 2017-03-14: 3 mL via RESPIRATORY_TRACT
  Filled 2017-03-13: qty 3

## 2017-03-13 MED ORDER — METHYLPREDNISOLONE SODIUM SUCC 125 MG IJ SOLR
125.0000 mg | Freq: Once | INTRAMUSCULAR | Status: AC
  Administered 2017-03-13: 125 mg via INTRAVENOUS
  Filled 2017-03-13: qty 2

## 2017-03-13 MED ORDER — ACETAMINOPHEN 650 MG RE SUPP: 650.0000 mg | Freq: Four times a day (QID) | RECTAL | Status: DC | PRN

## 2017-03-13 MED ORDER — IPRATROPIUM BROMIDE 0.02 % IN SOLN
0.5000 mg | Freq: Once | RESPIRATORY_TRACT | Status: AC
  Administered 2017-03-13: 0.5 mg via RESPIRATORY_TRACT
  Filled 2017-03-13: qty 2.5

## 2017-03-13 MED ORDER — PREDNISONE 50 MG PO TABS
60.0000 mg | ORAL_TABLET | Freq: Every day | ORAL | Status: DC
  Administered 2017-03-14: 60 mg via ORAL
  Filled 2017-03-13: qty 1

## 2017-03-13 MED ORDER — AZITHROMYCIN 250 MG PO TABS
500.0000 mg | ORAL_TABLET | Freq: Once | ORAL | Status: AC
  Administered 2017-03-13: 500 mg via ORAL
  Filled 2017-03-13: qty 2

## 2017-03-13 MED ORDER — CYCLOBENZAPRINE HCL 10 MG PO TABS
10.0000 mg | ORAL_TABLET | Freq: Three times a day (TID) | ORAL | Status: DC
  Administered 2017-03-13 – 2017-03-17 (×11): 10 mg via ORAL
  Filled 2017-03-13 (×11): qty 1

## 2017-03-13 MED ORDER — LEVOTHYROXINE SODIUM 25 MCG PO TABS
12.5000 ug | ORAL_TABLET | Freq: Every day | ORAL | Status: DC
  Administered 2017-03-14 – 2017-03-17 (×4): 12.5 ug via ORAL
  Filled 2017-03-13 (×4): qty 1

## 2017-03-13 MED ORDER — ENOXAPARIN SODIUM 40 MG/0.4ML ~~LOC~~ SOLN: 40.0000 mg | SUBCUTANEOUS | Status: DC

## 2017-03-13 MED ORDER — HYDROCODONE-ACETAMINOPHEN 10-325 MG PO TABS
1.0000 | ORAL_TABLET | Freq: Four times a day (QID) | ORAL | Status: DC | PRN
  Administered 2017-03-13 – 2017-03-17 (×10): 1 via ORAL
  Filled 2017-03-13 (×10): qty 1

## 2017-03-13 MED ORDER — ALBUTEROL SULFATE (2.5 MG/3ML) 0.083% IN NEBU: 5.0000 mg | INHALATION_SOLUTION | RESPIRATORY_TRACT | Status: DC | PRN

## 2017-03-13 MED ORDER — DEXTROSE 5 % IV SOLN
500.0000 mg | INTRAVENOUS | Status: DC
  Administered 2017-03-14: 500 mg via INTRAVENOUS
  Filled 2017-03-13 (×2): qty 500

## 2017-03-13 MED ORDER — HYDRALAZINE HCL 25 MG PO TABS
25.0000 mg | ORAL_TABLET | Freq: Three times a day (TID) | ORAL | Status: DC
  Administered 2017-03-13 – 2017-03-17 (×12): 25 mg via ORAL
  Filled 2017-03-13 (×12): qty 1

## 2017-03-13 MED ORDER — METOPROLOL TARTRATE 50 MG PO TABS
50.0000 mg | ORAL_TABLET | Freq: Two times a day (BID) | ORAL | Status: DC
  Administered 2017-03-13 – 2017-03-17 (×8): 50 mg via ORAL
  Filled 2017-03-13 (×8): qty 1

## 2017-03-13 MED ORDER — SENNOSIDES-DOCUSATE SODIUM 8.6-50 MG PO TABS: 1.0000 | ORAL_TABLET | Freq: Every evening | ORAL | Status: DC | PRN

## 2017-03-13 MED ORDER — GUAIFENESIN ER 600 MG PO TB12: 600.0000 mg | ORAL_TABLET | Freq: Two times a day (BID) | ORAL | Status: DC | PRN

## 2017-03-13 MED ORDER — IPRATROPIUM-ALBUTEROL 0.5-2.5 (3) MG/3ML IN SOLN
3.0000 mL | Freq: Once | RESPIRATORY_TRACT | Status: DC
Start: 2017-03-13 — End: 2017-03-13

## 2017-03-13 MED ORDER — ALBUTEROL SULFATE (2.5 MG/3ML) 0.083% IN NEBU
INHALATION_SOLUTION | RESPIRATORY_TRACT | Status: AC
  Filled 2017-03-13: qty 6

## 2017-03-13 MED ORDER — GABAPENTIN 600 MG PO TABS
600.0000 mg | ORAL_TABLET | Freq: Two times a day (BID) | ORAL | Status: DC
  Administered 2017-03-13 – 2017-03-17 (×8): 600 mg via ORAL
  Filled 2017-03-13 (×8): qty 1

## 2017-03-13 MED ORDER — VITAMIN D 1000 UNITS PO TABS
1000.0000 [IU] | ORAL_TABLET | Freq: Every day | ORAL | Status: DC
  Administered 2017-03-14 – 2017-03-17 (×4): 1000 [IU] via ORAL
  Filled 2017-03-13 (×4): qty 1

## 2017-03-13 MED ORDER — ALBUTEROL SULFATE (2.5 MG/3ML) 0.083% IN NEBU
5.0000 mg | INHALATION_SOLUTION | Freq: Once | RESPIRATORY_TRACT | Status: AC
  Administered 2017-03-13: 5 mg via RESPIRATORY_TRACT

## 2017-03-13 MED ORDER — IPRATROPIUM-ALBUTEROL 0.5-2.5 (3) MG/3ML IN SOLN
3.0000 mL | RESPIRATORY_TRACT | Status: DC
  Administered 2017-03-13: 3 mL via RESPIRATORY_TRACT
  Filled 2017-03-13: qty 3

## 2017-03-13 MED ORDER — INSULIN ASPART 100 UNIT/ML ~~LOC~~ SOLN
0.0000 [IU] | Freq: Three times a day (TID) | SUBCUTANEOUS | Status: DC
  Administered 2017-03-14: 7 [IU] via SUBCUTANEOUS

## 2017-03-13 MED ORDER — DEXTROSE 5 % IV SOLN
1.0000 g | Freq: Once | INTRAVENOUS | Status: DC
  Filled 2017-03-13: qty 10

## 2017-03-13 MED ORDER — NICOTINE POLACRILEX 2 MG MT GUM
2.0000 mg | CHEWING_GUM | OROMUCOSAL | Status: DC | PRN
  Filled 2017-03-13: qty 1

## 2017-03-13 NOTE — Assessment & Plan Note (Addendum)
66 y.o. female with intestinal AVM history with associated chronic GI blood loss resulting in iron deficiency and blood loss anemia. Patient has been managed with parenteral iron supplementation blood transfusions as the AVMs were to locate. History has been complicated by arterial thrombosis event for which patient does not currently receiving therapeutic anticoagulation due to history of bleeding.  Plan: --Will update lab work with blood counts, iron panel, B12 and folate on account of likely increased demand for those vitamins due to higher demand on erythropoiesis. --Blood support plan:  --QWk labs for now  --If Hgb <= 8.0 (based on patient becoming symptomatic from her anemia) -- transfuse 2 units pRBC and re-check Hgb. If not rising appropriately, admit to the hospital for additional transfusions  --If Hgb 8.1-9.0 -- transfuse 1 unit of pRBC and re-check Hgb  --If Hgb =>9.1 -- schedule monthly Venofer infusions to keep Ferritin >100 (CKD, chronic inflammatory state likely elevate the baseline).

## 2017-03-13 NOTE — Progress Notes (Signed)
[name] New Visit:  Assessment: Iron deficiency anemia secondary to blood loss (chronic) 66 y.o. female with intestinal AVM history with associated chronic GI blood loss resulting in iron deficiency and blood loss anemia. Patient has been managed with parenteral iron supplementation blood transfusions as the AVMs were to locate. History has been complicated by arterial thrombosis event for which patient does not currently receiving therapeutic anticoagulation due to history of bleeding.  Plan: --Will update lab work with blood counts, iron panel, B12 and folate on account of likely increased demand for those vitamins due to higher demand on erythropoiesis. --Blood support plan:  --QWk labs for now  --If Hgb <= 8.0 (based on patient becoming symptomatic from her anemia) -- transfuse 2 units pRBC and re-check Hgb. If not rising appropriately, admit to the hospital for additional transfusions  --If Hgb 8.1-9.0 -- transfuse 1 unit of pRBC and re-check Hgb  --If Hgb =>9.1 -- schedule monthly Venofer infusions to keep Ferritin >100 (CKD, chronic inflammatory state likely elevate the baseline).   Hereditary protein C deficiency ([place]) Diagnosis was made shortly after patient presented with thrombotic event and protein C was likely depleted due to the thrombosis. In this situation, I questions a diagnosis. I believe repeating thrombophilia panel including protein C, protein S, antithrombin III, antiphospholipid antibody syndrome labs is indicated  Plan: --Labs as above --Review and return to clinic  Return to clinic in 1 week you lab work and possibly undergo blood transfusions  Voice recognition software was used and creation of this note. Despite my best effort at editing the text, some misspelling/errors may have occurred.  Orders Placed This Encounter  Procedures  . CBC & Diff and Retic    Standing Status:   Future    Number of Occurrences:   1    Standing  Expiration Date:   03/07/2018  . Morphology    Standing Status:   Future    Number of Occurrences:   1    Standing Expiration Date:   03/07/2018  . Lactate dehydrogenase (LDH)    Standing Status:   Future    Number of Occurrences:   1    Standing Expiration Date:   03/07/2018  . Comprehensive metabolic panel    Standing Status:   Future    Number of Occurrences:   1    Standing Expiration Date:   03/07/2018  . Ferritin    Standing Status:   Future    Number of Occurrences:   1    Standing Expiration Date:   03/07/2018  . Iron and TIBC    Standing Status:   Future    Number of Occurrences:   1    Standing Expiration Date:   03/07/2018  . Folate, Serum    Standing Status:   Future    Number of Occurrences:   1    Standing Expiration Date:   03/07/2018  . Vitamin B12    Standing Status:   Future    Number of Occurrences:   1    Standing Expiration Date:   03/07/2018  . Antithrombin III*    Standing Status:   Future    Number of Occurrences:   1    Standing Expiration Date:   03/07/2018  . Antithrombin III antigen    Standing Status:   Future    Number of Occurrences:   1    Standing Expiration Date:   03/07/2018  . Beta-2-glycoprotein i abs, IgG/M/A  Standing Status:   Future    Number of Occurrences:   1    Standing Expiration Date:   03/07/2018  . Cardiolipin antibodies, IgG, IgM, IgA*    Standing Status:   Future    Number of Occurrences:   1    Standing Expiration Date:   03/07/2018  . Lupus anticoagulant panel*    Standing Status:   Future    Number of Occurrences:   1    Standing Expiration Date:   03/07/2018  . Protein C activity*    Standing Status:   Future    Number of Occurrences:   1    Standing Expiration Date:   03/07/2018  . Protein C, total*    Standing Status:   Future    Number of Occurrences:   1    Standing Expiration Date:   03/07/2018  . Protein S activity*    Standing Status:   Future    Number of Occurrences:   1    Standing  Expiration Date:   03/07/2018  . Protein S, Antigen, Free*    Standing Status:   Future    Number of Occurrences:   1    Standing Expiration Date:   03/07/2018  . Protein S, total    Standing Status:   Future    Number of Occurrences:   1    Standing Expiration Date:   03/07/2018    All questions were answered.  . The patient knows to call the clinic with any problems, questions or concerns.  This note was electronically signed.    History of Presenting Illness Bethany [name] 66 y.o. [facility] in management of chronic anemia due to running blood loss via AVMs in the GI tract. 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 Patient is not on therapeutic anticoagulation due to recurrent gastrointestinal bleeding. Additional past medical history includes insulin-dependent diabetes mellitus, hypertension, GERD, hypothyroidism, coronary artery disease. Patient also was diagnosed with protein C deficiency in the context of arterial clots.  At the present time, patient continues to smoke 0.5 packs per day which she has done over the past 40 years. She does not consume alcohol. Patient reports low energy, generalized slowing of the activities of daily living, easy fatigability. Also reports cramping in the lower extremities with ambulation. Denies fevers, chills, night sweats. No active chest pain or shortness of breath at rest. No significant abdominal pain, diarrhea, or constipation. No recurrent melena.  Patient has been receiving support with parenteral iron supplementation and blood transfusions for management of her chronic anemia.  Oncological/hematological History: --Labs, 03/01/17: Hgb 8.8, MCV 73.3, MCH 22.0, RDW 18.4, Plt 318; tProt 7.1, Alb 4.2, Ca 10.4, Cr 1.2, AP 125; TSH 4.74  Medical History: Past Medical History:  Diagnosis Date  . Acute renal failure (HCC)   . Arthritis   . Diabetes mellitus  without complication (HCC)   . Hereditary protein C deficiency (HCC)   . Hypercalcemia   . Hypertension   . Renal disorder   . Single kidney   . Thrombosis     Surgical History: Past Surgical History:  Procedure Laterality Date  . ABDOMINAL HYSTERECTOMY    . CHOLECYSTECTOMY    . NEPHRECTOMY RECIPIENT      Family History: Family History  Problem Relation Age of Onset  . Anemia Mother   . Diabetes Father   . Hypertension Brother       Social History: Social History   Social History  . Marital status: Single    Spouse name: N/A  . Number of children: N/A  . Years of education: N/A   Occupational History  . retired     Social History Main Topics  . Smoking status: Current Every Day Smoker    Types: Cigarettes  . Smokeless tobacco: Never Used  . Alcohol use No  . Drug use: No  . Sexual activity: Not on file   Other Topics Concern  . Not on file   Social History Narrative   Lives with daughter    Moved from [place]     Allergies: Allergies  Allergen Reactions  . Contrast Media [Iodinated Diagnostic Agents]     Renal hypertension per physician    Medications:  No current facility-administered medications for this visit.    Current Outpatient Prescriptions  Medication Sig Dispense Refill  . hydrALAZINE (APRESOLINE) 25 MG tablet Take 25 mg by mouth 3 (three) times daily.    . levothyroxine (SYNTHROID, LEVOTHROID) 25 MCG tablet Take 12.5 mcg by mouth daily before breakfast.    . metoprolol (LOPRESSOR) 50 MG tablet Take 50 mg by mouth 2 (two) times daily.     . Multiple Vitamins-Minerals (EQ MULTIVITAMINS ADULT GUMMY) CHEW Chew 1 tablet by mouth daily.    . pantoprazole (PROTONIX) 40 MG tablet Take 40 mg by mouth daily.    . tiZANidine (ZANAFLEX) 2 MG tablet Take 2 mg by mouth at bedtime.    . Cholecalciferol (VITAMIN D PO) Take 4,000 capsules by mouth daily.    . cyclobenzaprine (FLEXERIL) 10 MG tablet Take 10 mg by mouth 3 (three) times daily.    . Evolocumab  (REPATHA SURECLICK) 140 MG/ML SOAJ Inject 75 mg into the skin every 14 (fourteen) days.    . Gabapentin, Once-Daily, 600 MG TABS Take 600 mg by mouth 2 (two) times daily.    . HYDROcodone-acetaminophen (NORCO) 10-325 MG tablet Take 1 tablet by mouth every 6 (six) hours as needed for moderate pain.    . Insulin Glargine (TOUJEO SOLOSTAR) 300 UNIT/ML SOPN Inject 54 Units into the skin daily.    . insulin lispro (HUMALOG KWIKPEN) 100 UNIT/ML KiwkPen Inject 20 Units into the skin 3 (three) times daily. Inject 20 units with breakfast, 24 units with lunch, 20 units with dinner     Facility-Administered Medications Ordered in Other Visits  Medication Dose Route Frequency Provider Last Rate Last Dose  . 0.9 %  sodium chloride infusion  10 mL/hr Intravenous Once Kirichenko, Tatyana, PA-C      . acetaminophen (TYLENOL) tablet 650 mg  650 mg Oral Q6H PRN Wertman, Sara E, PA-C       Or  . acetaminophen (TYLENOL) suppository 650 mg  650 mg Rectal Q6H PRN Wertman, Sara E, PA-C      . albuterol (PROVENTIL) (2.5 MG/3ML) 0.083% nebulizer solution 5 mg  5 mg Nebulization Q2H PRN Wertman, Sara E, PA-C      . [START ON 03/14/2017] azithromycin (ZITHROMAX) 500 mg in dextrose 5 % 250 mL IVPB  500 mg Intravenous Q24H Wertman, Sara E, PA-C      . bisacodyl (DULCOLAX) suppository 10 mg  10 mg Rectal Daily PRN Wertman, Sara E, PA-C      . cefTRIAXone (ROCEPHIN) 1 g in dextrose 5 % 50 mL IVPB  1 g Intravenous Q24H Wertman, Sara E, PA-C 100 mL/hr at 03/13/17 1712 1 g at 03/13/17 1712  . [START ON 03/14/2017] cholecalciferol (  VITAMIN D) tablet 1,000 Units  1,000 Units Oral Daily Wertman, Sara E, PA-C      . cyclobenzaprine (FLEXERIL) tablet 10 mg  10 mg Oral TID Wertman, Sara E, PA-C      . Gabapentin (Once-Daily) TABS 600 mg  600 mg Oral BID Wertman, Sara E, PA-C      . guaiFENesin (MUCINEX) 12 hr tablet 600 mg  600 mg Oral BID PRN Wertman, Sara E, PA-C      . hydrALAZINE (APRESOLINE) tablet 25 mg  25 mg Oral TID Wertman,  Sara E, PA-C      . HYDROcodone-acetaminophen (NORCO) 10-325 MG per tablet 1 tablet  1 tablet Oral Q6H PRN Wertman, Sara E, PA-C      . Insulin Glargine SOPN 54 Units  54 Units Subcutaneous Daily Wertman, Sara E, PA-C      . ipratropium-albuterol (DUONEB) 0.5-2.5 (3) MG/3ML nebulizer solution 3 mL  3 mL Nebulization Q4H Wertman, Sara E, PA-C      . [START ON 03/14/2017] levothyroxine (SYNTHROID, LEVOTHROID) tablet 12.5 mcg  12.5 mcg Oral QAC breakfast Wertman, Sara E, PA-C      . metoprolol tartrate (LOPRESSOR) tablet 50 mg  50 mg Oral BID Wertman, Sara E, PA-C      . nicotine polacrilex (NICORETTE) gum 2 mg  2 mg Oral PRN Wertman, Sara E, PA-C      . ondansetron (ZOFRAN) tablet 4 mg  4 mg Oral Q6H PRN Wertman, Sara E, PA-C       Or  . ondansetron (ZOFRAN) injection 4 mg  4 mg Intravenous Q6H PRN Wertman, Sara E, PA-C      . pantoprazole (PROTONIX) EC tablet 40 mg  40 mg Oral Daily Wertman, Sara E, PA-C      . [START ON 03/14/2017] predniSONE (DELTASONE) tablet 60 mg  60 mg Oral Q breakfast Wertman, Sara E, PA-C        Review of Systems: Review of Systems  Constitutional: Positive for fatigue.  Musculoskeletal: Positive for myalgias.  All other systems reviewed and are negative.    PHYSICAL EXAMINATION Blood pressure (!) 164/63, pulse 84, temperature 98.9 F (37.2 C), temperature source Oral, resp. rate 18, height 5' 5.5" (1.664 m), weight 192 lb 4.8 oz (87.2 kg), SpO2 100 %.  ECOG PERFORMANCE STATUS: 2 - Symptomatic, <50% confined to bed  Physical Exam  Constitutional: She is oriented to person, place, and time and well-developed, well-nourished, and in no distress. No distress.  HENT:  Head: Normocephalic and atraumatic.  Mouth/Throat: Oropharynx is clear and moist. No oropharyngeal exudate.  Eyes: Pupils are equal, round, and reactive to light. Conjunctivae and EOM are normal. No scleral icterus.  Neck: No thyromegaly present.  Cardiovascular: Normal rate, regular rhythm, normal  heart sounds and intact distal pulses.  Exam reveals no gallop.   No murmur heard. Pulmonary/Chest: Effort normal and breath sounds normal. No respiratory distress. She has no wheezes. She has no rales. She exhibits no tenderness.  Abdominal: Soft. Bowel sounds are normal. She exhibits no distension and no mass. There is no tenderness. There is no rebound and no guarding.  Musculoskeletal: Normal range of motion. She exhibits no edema.  Lymphadenopathy:    She has no cervical adenopathy.  Neurological: She is alert and oriented to person, place, and time. She has normal reflexes. No cranial nerve deficit. Coordination normal.  Skin: No rash noted. She is not diaphoretic. No erythema. There is pallor.     LABORATORY DATA: I have personally   reviewed the data as listed: Appointment on 03/07/2017  Component Date Value Ref Range Status  . WBC 03/07/2017 11.9* 3.9 - 10.3 10e3/uL Final  . NEUT# 03/07/2017 9.7* 1.5 - 6.5 10e3/uL Final  . HGB 03/07/2017 8.2* 11.6 - 15.9 g/dL Final  . HCT 03/07/2017 29.0* 34.8 - 46.6 % Final  . Platelets 03/07/2017 254  145 - 400 10e3/uL Final  . MCV 03/07/2017 74.4* 79.5 - 101.0 fL Final  . MCH 03/07/2017 21.0* 25.1 - 34.0 pg Final  . MCHC 03/07/2017 28.3* 31.5 - 36.0 g/dL Final  . RBC 03/07/2017 3.90  3.70 - 5.45 10e6/uL Final  . RDW 03/07/2017 17.4* 11.2 - 14.5 % Final  . lymph# 03/07/2017 1.1  0.9 - 3.3 10e3/uL Final  . MONO# 03/07/2017 0.8  0.1 - 0.9 10e3/uL Final  . Eosinophils Absolute 03/07/2017 0.2  0.0 - 0.5 10e3/uL Final  . Basophils Absolute 03/07/2017 0.1  0.0 - 0.1 10e3/uL Final  . NEUT% 03/07/2017 81.5* 38.4 - 76.8 % Final  . LYMPH% 03/07/2017 9.6* 14.0 - 49.7 % Final  . MONO% 03/07/2017 6.6  0.0 - 14.0 % Final  . EOS% 03/07/2017 1.9  0.0 - 7.0 % Final  . BASO% 03/07/2017 0.4  0.0 - 2.0 % Final  . Retic % 03/07/2017 3.59* 0.70 - 2.10 % Final  . Retic Ct Abs 03/07/2017 140.01* 33.70 - 90.70 10e3/uL Final  . Immature Retic Fract 03/07/2017  30.80* 1.60 - 10.00 % Final  . Polychromasia 03/07/2017 occ  Slight Final  . Tear Drop Cells 03/07/2017 Few  Negative Final  . Ovalocytes 03/07/2017 occ  Negative Final  . Target Cells 03/07/2017 occ  Negative Final  . White Cell Comments 03/07/2017 C/W auto diff   Final  . PLT EST 03/07/2017 240  Adequate Final  . Platelet Morphology 03/07/2017  few Large Platelets  Within Normal Limits Final  . LDH 03/07/2017 147  125 - 245 U/L Final  . Sodium 03/07/2017 139  136 - 145 mEq/L Final  . Potassium 03/07/2017 3.9  3.5 - 5.1 mEq/L Final  . Chloride 03/07/2017 104  98 - 109 mEq/L Final  . CO2 03/07/2017 23  22 - 29 mEq/L Final  . Glucose 03/07/2017 218* 70 - 140 mg/dl Final   Glucose reference range is for nonfasting patients. Fasting glucose reference range is 70- 100.  Marland Kitchen BUN 03/07/2017 16.7  7.0 - 26.0 mg/dL Final  . Creatinine 03/07/2017 1.3* 0.6 - 1.1 mg/dL Final  . Total Bilirubin 03/07/2017 0.25  0.20 - 1.20 mg/dL Final  . Alkaline Phosphatase 03/07/2017 146  40 - 150 U/L Final  . AST 03/07/2017 13  5 - 34 U/L Final  . ALT 03/07/2017 12  0 - 55 U/L Final  . Total Protein 03/07/2017 7.3  6.4 - 8.3 g/dL Final  . Albumin 03/07/2017 3.8  3.5 - 5.0 g/dL Final  . Calcium 03/07/2017 10.0  8.4 - 10.4 mg/dL Final  . Anion Gap 03/07/2017 12* 3 - 11 mEq/L Final  . EGFR 03/07/2017 48* >60 ml/min/1.73 m2 Final   eGFR is calculated using the CKD-EPI Creatinine Equation (2009)  . Ferritin 03/07/2017 15  9 - 269 ng/ml Final  . Iron 03/07/2017 18* 41 - 142 ug/dL Final  . TIBC 03/07/2017 453* 236 - 444 ug/dL Final  . UIBC 03/07/2017 435* 120 - 384 ug/dL Final  . %SAT 03/07/2017 4* 21 - 57 % Final  . Folate 03/07/2017 9.3  >3.0 ng/mL Final   Comment: A serum  folate concentration of less than 3.1 ng/mL is considered to represent clinical deficiency.   . Vitamin B12 03/07/2017 783  232 - 1,245 pg/mL Final  . Antithrombin Activity 03/07/2017 127  75 - 135 % Final   Comment: Direct Xa inhibitor  anticoagulants such as rivaroxaban, apixaban and edoxaban will lead to spuriously elevated antithrombin activity levels possibly masking a deficiency.   . AT III AG PPP IMM-ACNC 03/07/2017 106  72 - 124 % Final   Comment: This test was developed and its performance characteristics determined by [facility]. It has not been cleared or approved by the Food and Drug Administration.   . Beta-2 Glycoprotein I Ab, IgG 03/07/2017 <9  0 - 20 GPI IgG units Final   Comment: The reference interval reflects a 3SD or 99th percentile interval, which is thought to represent a potentially clinically significant result in accordance with the International Consensus Statement on the classification criteria for definitive antiphospholipid syndrome (APS). J Thromb Haem 2006;4:295-306.   . Beta-2 Glyco 1 IgA 03/07/2017 <9  0 - 25 GPI IgA units Final   Comment: The reference interval reflects a 3SD or 99th percentile interval, which is thought to represent a potentially clinically significant result in accordance with the International Consensus Statement on the classification criteria for definitive antiphospholipid syndrome (APS). J Thromb Haem 2006;4:295-306.   . Beta-2 Glyco 1 IgM 03/07/2017 <9  0 - 32 GPI IgM units Final   Comment: The reference interval reflects a 3SD or 99th percentile interval, which is thought to represent a potentially clinically significant result in accordance with the International Consensus Statement on the classification criteria for definitive antiphospholipid syndrome (APS). J Thromb Haem 2006;4:295-306.   Marland Kitchen Anticardiolipin Ab,IgG,Qn 03/07/2017 <9  0 - 14 GPL U/mL Final   Comment:                                          Negative:              <15                                          Indeterminate:     15 - 20                                          Low-Med Positive: >20 - 80                                          High Positive:         >80   . Anticardiolipin  Ab,IgM,Qn 03/07/2017 <9  0 - 12 MPL U/mL Final   Comment:                                          Negative:              <13  Indeterminate:     13 - 20                                          Low-Med Positive: >20 - 80                                          High Positive:         >80   . Anticardiolipin Ab,IgA,Qn 03/07/2017 <9  0 - 11 APL U/mL Final   Comment:                                          Negative:              <12                                          Indeterminate:     12 - 20                                          Low-Med Positive: >20 - 80                                          High Positive:         >80   . PTT-LA 03/07/2017 27.6  0.0 - 51.9 sec Final  . dRVVT 03/07/2017 32.8  0.0 - 47.0 sec Final  . Lupus Reflex Interpretation 03/07/2017 Comment:   Final   No lupus anticoagulant was detected.  . Protein C-Functional 03/07/2017 119  73 - 180 % Final  . Protein C Antigen 03/07/2017 99  60 - 150 % Final  . Protein S-Functional 03/07/2017 101  63 - 140 % Final   Comment: Protein S activity may be falsely increased (masking an abnormal, low result) in patients receiving direct Xa inhibitor (e.g., rivaroxaban, apixaban, edoxaban) or a direct thrombin inhibitor (e.g., dabigatran) anticoagulant treatment due to assay interference by these drugs.   . Protein S, Free 03/07/2017 129  57 - 157 % Final   Comment: This test was developed and its performance characteristics determined by [facility]. It has not been cleared or approved by the Food and Drug Administration.   . Protein S, Total 03/07/2017 92  60 - 150 % Final   Comment: This test was developed and its performance characteristics determined by [facility]. It has not been cleared or approved by the Food and Drug Administration.          Ardath Sax, MD

## 2017-03-13 NOTE — Assessment & Plan Note (Signed)
Diagnosis was made shortly after patient presented with thrombotic event and protein C was likely depleted due to the thrombosis. In this situation, I questions a diagnosis. I believe repeating thrombophilia panel including protein C, protein S, antithrombin III, antiphospholipid antibody syndrome labs is indicated  Plan: --Labs as above --Review and return to clinic

## 2017-03-13 NOTE — ED Notes (Signed)
Patient given bag meal, CBG checked.

## 2017-03-13 NOTE — ED Triage Notes (Signed)
Pt to ER for 4 days of progressively worsening productive cough with shortness of breath. States sputum is green. Patient presents with audibile wheezing. Able to speak in complete sentences. VSS.

## 2017-03-13 NOTE — ED Notes (Signed)
Patients oxygen dropped from 98% RA to 92% RA when ambulated.

## 2017-03-13 NOTE — ED Provider Notes (Signed)
[facility] EMERGENCY DEPARTMENT Provider Note   CSN: [id number] Arrival date & time: 03/13/17  9381     History   Chief Complaint Chief Complaint  Patient presents with  . Shortness of Breath  . Cough    HPI Bethany [name] is a 66 y.o. female.  HPI Bethany [name] is a 66 y.o. female with hx of DM, HTN, AVMs in GI tract, protein deficiency, presents to ED with 4 days of cough, SOB. Pt reports productive cough with green sputum. Tried neb treatments with no improvements. Denies nausea, vomiting, diarrhea. Denies abdominal pain. No chest pain.  Patient is from [place], recently relocated.  Reports history of chronic anemia which is thought to be due to GI AVMs as well as iron deficiency.  Patient used to have to get iron infusion and blood transfusion periodically.  She is currently established care with Dr. Lebron Conners who she is supposed to see in 2 days for blood transfusion.     Past Medical History:  Diagnosis Date  . Acute renal failure ([place])   . Arthritis   . Diabetes mellitus without complication ([place])   . Hereditary protein C deficiency ([place])   . Hypercalcemia   . Hypertension   . Renal disorder   . Single kidney   . Thrombosis     Patient Active Problem List   Diagnosis Date Noted  . Iron deficiency anemia secondary to blood loss (chronic) 03/07/2017  . GI AVM (gastrointestinal arteriovenous vascular malformation) 03/07/2017  . Hereditary protein C deficiency ([place]) 03/07/2017    Past Surgical History:  Procedure Laterality Date  . ABDOMINAL HYSTERECTOMY    . CHOLECYSTECTOMY    . NEPHRECTOMY RECIPIENT      OB History    No data available       Home Medications    Prior to Admission medications   Medication Sig Start Date End Date Taking? Authorizing Provider  calcium-vitamin D (OSCAL WITH D) 500-200 MG-UNIT tablet Take 1 tablet by mouth.    [provider]  gabapentin (NEURONTIN) 300 MG capsule Take 600 mg by mouth 3 (three)  times daily.  01/27/14   [provider]  hydrALAZINE (APRESOLINE) 25 MG tablet Take 25 mg by mouth 3 (three) times daily.    [provider]  HYDROcodone-acetaminophen (NORCO) 7.5-325 MG per tablet Take 1 tablet by mouth 3 (three) times daily as needed for moderate pain.  02/26/14   [provider]  insulin glargine (LANTUS) 100 UNIT/ML injection Inject 52 Units into the skin at bedtime.    [provider]  insulin glulisine (APIDRA) 100 UNIT/ML injection Inject 24 Units into the skin 3 (three) times daily before meals.    [provider]  levothyroxine (SYNTHROID, LEVOTHROID) 25 MCG tablet Take 12.5 mcg by mouth daily before breakfast.    [provider]  metoprolol (LOPRESSOR) 50 MG tablet Take 50 mg by mouth 2 (two) times daily.  03/06/14   [provider]  Multiple Vitamins-Minerals (EQ MULTIVITAMINS ADULT GUMMY) CHEW Chew 1 tablet by mouth daily.    [provider]  pantoprazole (PROTONIX) 40 MG tablet Take 40 mg by mouth daily.    [provider]  pravastatin (PRAVACHOL) 40 MG tablet Take 40 mg by mouth daily. 01/27/14   [provider]  tiZANidine (ZANAFLEX) 2 MG tablet Take 2 mg by mouth at bedtime.    [provider]    Family History History reviewed. No pertinent family history.  Social  History Social History  Substance Use Topics  . Smoking status: Current Every Day Smoker  . Smokeless tobacco: Never Used  . Alcohol use No     Allergies   Patient has no known allergies.   Review of Systems Review of Systems  Constitutional: Negative for chills and fever.  HENT: Positive for congestion.   Respiratory: Positive for cough, shortness of breath and wheezing. Negative for chest tightness.   Cardiovascular: Negative for chest pain, palpitations and leg swelling.  Gastrointestinal: Negative for abdominal pain, diarrhea, nausea and vomiting.       Black stools  Genitourinary:  Negative for dysuria, flank pain and pelvic pain.  Musculoskeletal: Negative for arthralgias, myalgias, neck pain and neck stiffness.  Skin: Negative for rash.  Neurological: Negative for dizziness, weakness and headaches.  All other systems reviewed and are negative.    Physical Exam Updated Vital Signs BP (!) 153/61 (BP Location: Left Arm)   Pulse 64   Temp 98.4 F (36.9 C) (Oral)   Resp (!) 22   SpO2 96%   Physical Exam  Constitutional: She appears well-developed and well-nourished. No distress.  HENT:  Head: Normocephalic.  Eyes: Conjunctivae are normal.  Neck: Neck supple.  Cardiovascular: Normal rate, regular rhythm and normal heart sounds.   Pulmonary/Chest: Effort normal. She has wheezes. She has no rales.  Inspiratory and expiratory wheezes bilaterally, decreased air movement.  Patient is coughing constantly.  Abdominal: Soft. Bowel sounds are normal. She exhibits no distension. There is no tenderness. There is no rebound.  Musculoskeletal: She exhibits no edema.  Neurological: She is alert.  Skin: Skin is warm and dry.  Psychiatric: She has a normal mood and affect. Her behavior is normal.  Nursing note and vitals reviewed.    ED Treatments / Results  Labs (all labs ordered are listed, but only abnormal results are displayed) Labs Reviewed  COMPREHENSIVE METABOLIC PANEL - Abnormal; Notable for the following:       Result Value   Glucose, Bld 179 (*)    Albumin 3.4 (*)    GFR calc non Af Amer 57 (*)    All other components within normal limits  CBC WITH DIFFERENTIAL/PLATELET - Abnormal; Notable for the following:    WBC 11.7 (*)    RBC 3.49 (*)    Hemoglobin 7.0 (*)    HCT 25.1 (*)    MCV 71.9 (*)    MCH 20.1 (*)    MCHC 27.9 (*)    RDW 17.2 (*)    Neutro Abs 9.3 (*)    All other components within normal limits    EKG  EKG Interpretation None       Radiology Dg Chest 2 View  Result Date: 03/13/2017 CLINICAL DATA:  Productive cough, fever,  left side chest tightness EXAM: CHEST  2 VIEW COMPARISON:  04/01/2004 FINDINGS: Mild cardiomegaly. Peribronchial thickening and interstitial prominence in the lungs. No confluent opacities or effusions. No acute bony abnormality. IMPRESSION: Cardiomegaly. Peribronchial thickening and interstitial prominence. Differential considerations would include early interstitial edema, bronchitis, or atypical pneumonia. Electronically Signed   By: Rolm Baptise M.D.   On: 03/13/2017 11:32    Procedures Procedures (including critical care time)  Medications Ordered in ED Medications  albuterol (PROVENTIL) (2.5 MG/3ML) 0.083% nebulizer solution 5 mg (5 mg Nebulization Given 03/13/17 0956)     Initial Impression / Assessment and Plan / ED Course  I have reviewed the triage vital signs and the nursing notes.  Pertinent labs & imaging  results that were available during my care of the patient were reviewed by me and considered in my medical decision making (see chart for details).    Patient with wheezing, cough, URI symptoms.  Will try breathing treatment, reassess.  Chest x-ray labs ordered.   Hemoglobin today 7, this is a significant drop just from 2 days ago of over 1 g.  Patient is supposed to get transfusion in 2 days.  Patient has not improved after breathing treatment, lungs continue to sound wheezy.  Will get an hour-long treatment done.   Patient has not improved after lateral lung treatment.  Patient also contacted her hematologist Dr. Lebron Conners who believes the patient may need a transfusion at this time.  Recommended admitting patient.  Given no improvement in breathing, also ambulated with dropping oxygen saturation to about 92% and significant discomfort or shortness of breath, will admit patient.  Ordered type and screen and transfusion of 2 units of packed red blood cells.  Spoke with hospitalist, they will admit.  Final Clinical Impressions(s) / ED Diagnoses   Final diagnoses:  COPD  exacerbation ([place])  Anemia, unspecified type    New Prescriptions New Prescriptions   No medications on file     Jeannett Senior, Hershal Coria 03/14/17 [place], Kevin, MD 03/15/17 [phone]

## 2017-03-13 NOTE — Telephone Encounter (Signed)
Pt is in [facility] ER, she has URI, she has infiltrate in lung. They put her on zithromycin. Her Hg is 7 today, ER is not planning on giving her a transfusion today b/c they know she has appt on Wednesday. ER will send her home so long as her o2 sats hold up. She is due for iron on Wednesday.   Please call sister Cleda Clarks [phone]  S/w Dr Lebron Conners and instructed Rosalio Loud to have ER transfuse blood before they let her go home. Have ER MD call Dr Lebron Conners is necessary.

## 2017-03-13 NOTE — ED Notes (Signed)
Pt. Ambulated in hallway at 15:14. O2 was observed to drop from 98% to 92%, but fluctuated between 94% and 92%. Pt breathed heavily and coughed up a lot of phlegm while ambulating.

## 2017-03-13 NOTE — H&P (Signed)
History and Physical    JASMA SEEVERS DTO:[id number] DOB: 01-29-1951 DOA: 03/13/2017   PCP: Wenda Low, MD   Patient coming from:  Home    Chief Complaint: Shortness of breath and cough   HPI: Bethany [name] is a 66 y.o. female with medical history significant for diabetes, hypertension, history of  protein C deficiency, tobacco abuse, COPD, CKD stage III,h/o chronic AVM, chronic iron deficiency anemia, requiring transfusion every 4 months, last in July of this year, recently moving to [place] to be with her sister, and trying to establish care with different physicians, including hematologists, GI and primary. The patient noted to have a 4 day history of progressive productive green sputum with cough, increasing shortness of breath, wheezing, for which she presented to the emergency department for further evaluation. The patient reports sick contacting her daughter who had similar symptoms. Of note, she had been instructed to present to the ED as well for evaluation of severe anemia. On 03/07/2017 her hemoglobin was 8.2, and today was 7, for which hematology is recommended that she be seen at the ED. In view of these 2 main issues, she sought medical attention. She denies any fever, has intermittent chills. She denies any night sweats. She has not tried any over-the-counter products for the treatment of her symptoms. She denies any cardiac complaints. She does complain of chest wall pain area on the left. She denies any nausea or vomiting. No diarrhea. NO changes in her diet but admits to eat excessive amount of ice chips, pica. She reports right lower extremity swelling, with some tenderness in her calf. This has been going on for about 1 week as well. She denies any recent travel rather than when moving 4 months ago. She does not take aspirin daily. She is on no anticoagulants due to her history of AVM. She continues to smoke. She denies any alcohol or recreational drug use. She is not on  hormonal products. She is very sedentary. She has been vaccinated against pneumonia, but she was due on November 18 to receive flu vaccine.   ED Course:  BP (!) 164/52   Pulse 75   Temp 98.4 F (36.9 C) (Oral)   Resp (!) 23   SpO2 98%    CXR today suggests atypical pneumonia  WBC normal  Lactic acid pending   Bicarb 23 received nebs, Solu-Medrol 125 mg IV 1, and was placed on Zithromax and Rocephin. Cultures were drawn prior to the initiation of antibiotics.    Hemoglobin on admission was 7, with MCV of 71.9. One week prior, the patient's hemoglobin was 8. Hemoccult was not done, as the patient has chronic AVM. Repeat CBC is pending. Likely, the patient will be receiving 2 units of blood here. Last iron was 18, with ferritin of 15.  WBC 11.7, Cr 1 Glu 179  Review of Systems:  As per HPI otherwise all other systems reviewed and are negative  Past Medical History:  Diagnosis Date  . Acute renal failure ([place])   . Arthritis   . Diabetes mellitus without complication ([place])   . Hereditary protein C deficiency ([place])   . Hypercalcemia   . Hypertension   . Renal disorder   . Single kidney   . Thrombosis     Past Surgical History:  Procedure Laterality Date  . ABDOMINAL HYSTERECTOMY    . CHOLECYSTECTOMY    . NEPHRECTOMY RECIPIENT      Social History Social History   Social History  . Marital  status: Single    Spouse name: N/A  . Number of children: N/A  . Years of education: N/A   Occupational History  . retired     Social History Main Topics  . Smoking status: Current Every Day Smoker    Types: Cigarettes  . Smokeless tobacco: Never Used  . Alcohol use No  . Drug use: No  . Sexual activity: Not on file   Other Topics Concern  . Not on file   Social History Narrative   Lives with daughter    Dorie Rank from [place]      Allergies  Allergen Reactions  . Contrast Media [Iodinated Diagnostic Agents]     Renal hypertension per physician    Family History  Problem  Relation Age of Onset  . Anemia Mother   . Diabetes Father   . Hypertension Brother       Prior to Admission medications   Medication Sig Start Date End Date Taking? Authorizing Provider  Cholecalciferol (VITAMIN D PO) Take 4,000 capsules by mouth daily.   Yes [provider]  cyclobenzaprine (FLEXERIL) 10 MG tablet Take 10 mg by mouth 3 (three) times daily.   Yes [provider]  Evolocumab (REPATHA SURECLICK) 454 MG/ML SOAJ Inject 75 mg into the skin every 14 (fourteen) days.   Yes [provider]  Gabapentin, Once-Daily, 600 MG TABS Take 600 mg by mouth 2 (two) times daily.   Yes [provider]  hydrALAZINE (APRESOLINE) 25 MG tablet Take 25 mg by mouth 3 (three) times daily.   Yes [provider]  HYDROcodone-acetaminophen (NORCO) 10-325 MG tablet Take 1 tablet by mouth every 6 (six) hours as needed for moderate pain.   Yes [provider]  Insulin Glargine (TOUJEO SOLOSTAR) 300 UNIT/ML SOPN Inject 54 Units into the skin daily.   Yes [provider]  insulin lispro (HUMALOG KWIKPEN) 100 UNIT/ML KiwkPen Inject 20 Units into the skin 3 (three) times daily. Inject 20 units with breakfast, 24 units with lunch, 20 units with dinner 09/19/16  Yes [provider]  levothyroxine (SYNTHROID, LEVOTHROID) 25 MCG tablet Take 12.5 mcg by mouth daily before breakfast.   Yes [provider]  metoprolol (LOPRESSOR) 50 MG tablet Take 50 mg by mouth 2 (two) times daily.  03/06/14  Yes [provider]  Multiple Vitamins-Minerals (EQ MULTIVITAMINS ADULT GUMMY) CHEW Chew 1 tablet by mouth daily.   Yes [provider]  pantoprazole (PROTONIX) 40 MG tablet Take 40 mg by mouth daily.   Yes [provider]  tiZANidine (ZANAFLEX) 2 MG tablet Take 2 mg by mouth at bedtime.   Yes [provider]    Physical Exam:  Vitals:   03/13/17 1225 03/13/17 1300 03/13/17 1309 03/13/17 1400  BP: (!) 153/61 (!)  157/64  (!) 164/52  Pulse: 64 66  75  Resp: (!) 22 (!) 28  (!) 23  Temp:      TempSrc:      SpO2: 96% 97% 96% 98%   Constitutional: NAD, calm, uncomfortable due to cough  Eyes: PERRL, lids and conjunctivae normal ENMT: Mucous membranes are moist, without exudate or lesions  Neck: normal, supple, no masses, no thyromegaly Respiratory:  Diffuse  no wheezing and rhonchi, no ralesSome respiratory effort noted  Cardiovascular: Regular rate and rhythm,  murmur, rubs or gallops. No extremity edema. 2+ pedal pulses. No carotid bruits.  Abdomen: Soft, obese, non tender, No hepatosplenomegaly. Bowel sounds positive.  Musculoskeletal: no clubbing / cyanosis. Moves all  extremities Skin: no jaundice, No lesions.  Neurologic: Sensation intact  Strength equal in all extremities Psychiatric:   Alert and oriented x 3. Normal mood.     Labs on Admission: I have personally reviewed following labs and imaging studies  CBC:  Recent Labs Lab 03/07/17 1533 03/13/17 0955  WBC 11.9* 11.7*  NEUTROABS 9.7* 9.3*  HGB 8.2* 7.0*  HCT 29.0* 25.1*  MCV 74.4* 71.9*  PLT 254 417    Basic Metabolic Panel:  Recent Labs Lab 03/07/17 1533 03/13/17 0955  NA 139 137  K 3.9 4.1  CL  --  102  CO2 23 23  GLUCOSE 218* 179*  BUN 16.7 12  CREATININE 1.3* 1.00  CALCIUM 10.0 9.5    GFR: Estimated Creatinine Clearance: 60.2 mL/min (by C-G formula based on SCr of 1 mg/dL).  Liver Function Tests:  Recent Labs Lab 03/07/17 1533 03/13/17 0955  AST 13 22  ALT 12 17  ALKPHOS 146 122  BILITOT 0.25 0.4  PROT 7.3 6.9  ALBUMIN 3.8 3.4*   No results for input(s): LIPASE, AMYLASE in the last 168 hours. No results for input(s): AMMONIA in the last 168 hours.  Coagulation Profile: No results for input(s): INR, PROTIME in the last 168 hours.  Cardiac Enzymes: No results for input(s): CKTOTAL, CKMB, CKMBINDEX, TROPONINI in the last 168 hours.  BNP (last 3 results) No results for input(s): PROBNP in the  last 8760 hours.  HbA1C: No results for input(s): HGBA1C in the last 72 hours.  CBG:  Recent Labs Lab 03/13/17 1527  GLUCAP 204*    Lipid Profile: No results for input(s): CHOL, HDL, LDLCALC, TRIG, CHOLHDL, LDLDIRECT in the last 72 hours.  Thyroid Function Tests: No results for input(s): TSH, T4TOTAL, FREET4, T3FREE, THYROIDAB in the last 72 hours.  Anemia Panel: No results for input(s): VITAMINB12, FOLATE, FERRITIN, TIBC, IRON, RETICCTPCT in the last 72 hours.  Urine analysis: No results found for: COLORURINE, APPEARANCEUR, LABSPEC, PHURINE, GLUCOSEU, HGBUR, BILIRUBINUR, KETONESUR, PROTEINUR, UROBILINOGEN, NITRITE, LEUKOCYTESUR  Sepsis Labs: @LABRCNTIP (procalcitonin:4,lacticidven:4) )No results found for this or any previous visit (from the past 240 hour(s)).   Radiological Exams on Admission: Dg Chest 2 View  Result Date: 03/13/2017 CLINICAL DATA:  Productive cough, fever, left side chest tightness EXAM: CHEST  2 VIEW COMPARISON:  04/01/2004 FINDINGS: Mild cardiomegaly. Peribronchial thickening and interstitial prominence in the lungs. No confluent opacities or effusions. No acute bony abnormality. IMPRESSION: Cardiomegaly. Peribronchial thickening and interstitial prominence. Differential considerations would include early interstitial edema, bronchitis, or atypical pneumonia. Electronically Signed   By: Rolm Baptise M.D.   On: 03/13/2017 11:32    EKG: Independently reviewed.  Assessment/Plan Active Problems:   Iron deficiency anemia secondary to blood loss (chronic)   Community acquired pneumonia   COPD exacerbation (HCC)   GI AVM (gastrointestinal arteriovenous vascular malformation)   Hereditary protein C deficiency (HCC)   Chronic kidney disease (CKD), stage III (moderate) (HCC)   Chronic headaches   Depression   Diabetes mellitus type 2 with neurological manifestations (HCC)   History of thrombosis   Hyperlipemia, mixed   History of colonic polyps    Pneumonia   Acute Respiratory distress likely due to  CAP and underlying, undiagnosed COPD exacerbation and to certain extent symptomatic anemia( see below).Presenting now with ongoing / progressive symptoms including non  productive cough, shortness of breath  CXR today suggests atypical pneumonia  WBC normal  Lactic acid pending   Bicarb 23 received nebs, to send next, Solu-Medrol 125 mg  IV 1, and was placed on Zithromax and Rocephin. Cultures were drawn prior to the initiation of antibiotics. Continues to be symptomatic, with significant wheezing and rhonchi. Of note, the patient continues to smoke. Admit to Tele IP  Oxygen   sputum cultures  IV antibiotics with per protocol with Zithromax and Rocephin. Procalcitonin,Strep pneumo urine antigen, Legionella urine antigen Nebulizers as needed, with Duoneb q 4 and Albuterol q 2 h prn Prednisone 60 mg daily  Influenza by PCR Lactic acid 1 Mucinex prn  Repeat CBC in am CXR in am  Nicotine gum prn. Tobacco cessation counseled   Cardiomegaly per CXR, no prior diagnosis of CHF. EKG NSR . Tn 0.4 in view O2 demand due to above  Check BNP  CXR  Serial Tn  Right leg swelling, patient has noticed right calf pain. Patient has a history of thrombosis. Not on Anticoagulation due to AVM history O2 sats are normal. She is not requiring oxygen at this time. History of protein C deficiency. Check DDmer and  Lower extremity doppler   Symptomatic Anemia in a patient  with a history of vitamin deficiency anemia, and AVM. The patient has been followed in [place], with last transfusion last July. She usually receives 2 units of blood every 4 months. She has been seen by a hematologist here, who has noted that the patient dropped 1 g of hemoglobin in less than 1 week.  Hemoglobin on admission was 7, with MCV of 71.9. One week prior, the patient's hemoglobin was 8. Hemoccult was not done, as the patient has chronic AVM. Repeat CBC is pending. Likely, the  patient will be receiving 2 units of blood here. Last iron was 18, with ferritin of 15. Repeat CBC in am  Will likely receive transfusion as the patient is symptomatic. 2 units are in process, pending on the results of the CBC. Check smear. If counts continue to drop will obtain consultation with GI (patient is to see Dr. Cristina Gong) and Hematology to help in the management of anemia, including Iron infusion   Leukocytosis, likely reactive. WBC 11.7, Cx pending . Afebrile   Repeat CBC in AM Await for cultures    Chronic kidney disease stage 3    baseline creatinine 1   Current Cr 1.Single kidney after thrombotic event in renal artery  Lab Results  Component Value Date   CREATININE 1.00 03/13/2017   CREATININE 1.3 (H) 03/07/2017   CREATININE 1.11 (H) 03/10/2014  Repeat CMET in am    Type II Diabetes Current blood sugar level is 179 No results found for: HGBA1C Hgb A1C Hold home oral diabetic medications.  Continue Glargine, SSI Continue Neurontin    Hypertension BP   164/52   Pulse 75  Continue home anti-hypertensive medications with oral hydralazine   Hypothyroidism: Continue home Synthroid   GERD, no acute symptoms Continue PPI  Hyperlipidemia Continue Repatha q 14 d as OP     DVT prophylaxis:  SCDs due to chronic AVM. Code Status:   Full code  Family Communication:  Discussed with patient Disposition Plan: Expect patient to be discharged to home after condition improves Consults called:    None Admission status: Inpatient telemetry   [place], PA-C [facility]   03/13/2017, 4:54 PM

## 2017-03-14 ENCOUNTER — Inpatient Hospital Stay (HOSPITAL_COMMUNITY): Payer: Medicare Other

## 2017-03-14 DIAGNOSIS — M7989 Other specified soft tissue disorders: Secondary | ICD-10-CM

## 2017-03-14 DIAGNOSIS — D649 Anemia, unspecified: Secondary | ICD-10-CM

## 2017-03-14 LAB — CBC
HCT: 29.4 % — ABNORMAL LOW (ref 36.0–46.0)
Hemoglobin: 9.1 g/dL — ABNORMAL LOW (ref 12.0–15.0)
MCH: 23 pg — ABNORMAL LOW (ref 26.0–34.0)
MCHC: 31 g/dL (ref 30.0–36.0)
MCV: 74.4 fL — ABNORMAL LOW (ref 78.0–100.0)
Platelets: 272 10*3/uL (ref 150–400)
RBC: 3.95 MIL/uL (ref 3.87–5.11)
RDW: 18.3 % — ABNORMAL HIGH (ref 11.5–15.5)
WBC: 14.2 10*3/uL — ABNORMAL HIGH (ref 4.0–10.5)

## 2017-03-14 LAB — RESPIRATORY PANEL BY PCR

## 2017-03-14 LAB — PROTIME-INR
INR: 1
Prothrombin Time: 13.1 seconds (ref 11.4–15.2)

## 2017-03-14 LAB — HIV ANTIBODY (ROUTINE TESTING W REFLEX): HIV Screen 4th Generation wRfx: NONREACTIVE

## 2017-03-14 LAB — GLUCOSE, CAPILLARY
Glucose-Capillary: 310 mg/dL — ABNORMAL HIGH (ref 65–99)
Glucose-Capillary: 341 mg/dL — ABNORMAL HIGH (ref 65–99)
Glucose-Capillary: 385 mg/dL — ABNORMAL HIGH (ref 65–99)
Glucose-Capillary: 327 mg/dL — ABNORMAL HIGH (ref 65–99)

## 2017-03-14 LAB — TROPONIN I: Troponin I: <0.03 ng/mL (ref <0.03)

## 2017-03-14 MED ORDER — IPRATROPIUM-ALBUTEROL 0.5-2.5 (3) MG/3ML IN SOLN
3.0000 mL | Freq: Four times a day (QID) | RESPIRATORY_TRACT | Status: DC
  Administered 2017-03-14 – 2017-03-17 (×12): 3 mL via RESPIRATORY_TRACT
  Filled 2017-03-14 (×13): qty 3

## 2017-03-14 MED ORDER — METHYLPREDNISOLONE SODIUM SUCC 40 MG IJ SOLR
40.0000 mg | Freq: Four times a day (QID) | INTRAMUSCULAR | Status: DC
  Administered 2017-03-14 – 2017-03-15 (×4): 40 mg via INTRAVENOUS
  Filled 2017-03-14 (×5): qty 1

## 2017-03-14 MED ORDER — GUAIFENESIN-DM 100-10 MG/5ML PO SYRP
5.0000 mL | ORAL_SOLUTION | ORAL | Status: DC | PRN
  Administered 2017-03-14: 5 mL via ORAL
  Filled 2017-03-14: qty 5

## 2017-03-14 MED ORDER — INSULIN ASPART 100 UNIT/ML ~~LOC~~ SOLN
0.0000 [IU] | Freq: Every day | SUBCUTANEOUS | Status: DC
Start: 2017-03-14 — End: 2017-03-17
  Administered 2017-03-15: 2 [IU] via SUBCUTANEOUS
  Administered 2017-03-15 – 2017-03-16 (×2): 4 [IU] via SUBCUTANEOUS

## 2017-03-14 MED ORDER — SODIUM CHLORIDE 0.9 % IV SOLN
510.0000 mg | Freq: Once | INTRAVENOUS | Status: AC
  Administered 2017-03-14: 510 mg via INTRAVENOUS
  Filled 2017-03-14: qty 17

## 2017-03-14 MED ORDER — INSULIN ASPART 100 UNIT/ML ~~LOC~~ SOLN
0.0000 [IU] | Freq: Three times a day (TID) | SUBCUTANEOUS | Status: DC
  Administered 2017-03-14: 20 [IU] via SUBCUTANEOUS
  Administered 2017-03-14 – 2017-03-15 (×2): 15 [IU] via SUBCUTANEOUS
  Administered 2017-03-15: 7 [IU] via SUBCUTANEOUS
  Administered 2017-03-15: 20 [IU] via SUBCUTANEOUS
  Administered 2017-03-16: 11 [IU] via SUBCUTANEOUS
  Administered 2017-03-16: 15 [IU] via SUBCUTANEOUS
  Administered 2017-03-16 – 2017-03-17 (×2): 7 [IU] via SUBCUTANEOUS
  Administered 2017-03-17: 11 [IU] via SUBCUTANEOUS

## 2017-03-14 MED ORDER — NICOTINE 21 MG/24HR TD PT24
21.0000 mg | MEDICATED_PATCH | Freq: Every day | TRANSDERMAL | Status: DC
  Administered 2017-03-14 – 2017-03-17 (×4): 21 mg via TRANSDERMAL
  Filled 2017-03-14 (×4): qty 1

## 2017-03-14 MED ORDER — GUAIFENESIN ER 600 MG PO TB12
600.0000 mg | ORAL_TABLET | Freq: Two times a day (BID) | ORAL | Status: DC
  Administered 2017-03-14 – 2017-03-17 (×7): 600 mg via ORAL
  Filled 2017-03-14 (×7): qty 1

## 2017-03-14 MED ORDER — AMLODIPINE BESYLATE 5 MG PO TABS
5.0000 mg | ORAL_TABLET | Freq: Every day | ORAL | Status: DC
  Administered 2017-03-14 – 2017-03-17 (×4): 5 mg via ORAL
  Filled 2017-03-14 (×4): qty 1

## 2017-03-14 NOTE — Progress Notes (Signed)
PROGRESS NOTE    Bethany [name]  DGL:[id number] DOB: 01-Sep-1950 DOA: 03/13/2017 PCP: Wenda Low, MD    Brief Narrative:  Bethany [name] is a 66 y.o. female with medical history significant for diabetes, hypertension, history of  protein C deficiency, tobacco abuse, COPD, CKD stage III,h/o chronic AVM, chronic iron deficiency anemia, requiring transfusion every 4 months, last in July of this year, recently moving to [place] to be with her sister, and trying to establish care with different physicians, including hematologists, GI and primary. The patient noted to have a 4 day history of progressive productive green sputum with cough, increasing shortness of breath, wheezing, for which she presented to the emergency department for further evaluation. The patient reports sick contacting her daughter who had similar symptoms. Of note, she had been instructed to present to the ED as well for evaluation of severe anemia. On 03/07/2017 her hemoglobin was 8.2, and today was 7, for which hematology is recommended that she be seen at the ED. In view of these 2 main issues, she sought medical attention.  She does complain of chest wall pain area on the left.  She reports right lower extremity swelling, with some tenderness in her calf. This has been going on for about 1 week as well. She denies any recent travel rather than when moving 4 months ago. She does not take aspirin daily. She is on no anticoagulants due to her history of AVM.   ED Course:  BP (!) 164/52   Pulse 75   Temp 98.4 F (36.9 C) (Oral)   Resp (!) 23   SpO2 98%    CXR today suggests atypical pneumonia  WBC normal  Lactic acid pending   Bicarb 23 received nebs, Solu-Medrol 125 mg IV 1, and was placed on Zithromax and Rocephin. Cultures were drawn prior to the initiation of antibiotics.    Hemoglobin on admission was 7, with MCV of 71.9. One week prior, the patient's hemoglobin was 8. Hemoccult was not done, as the patient has chronic  AVM. Repeat CBC is pending. Likely, the patient will be receiving 2 units of blood here. Last iron was 18, with ferritin of 15.  Assessment & Plan:   Active Problems:   Iron deficiency anemia secondary to blood loss (chronic)   GI AVM (gastrointestinal arteriovenous vascular malformation)   Hereditary protein C deficiency (HCC)   Chronic kidney disease (CKD), stage III (moderate) (HCC)   Chronic headaches   Depression   Diabetes mellitus type 2 with neurological manifestations (HCC)   History of thrombosis   Hyperlipemia, mixed   History of colonic polyps   Community acquired pneumonia   COPD exacerbation (HCC)   Pneumonia  1-Acute COPD exacerbation; (undiagniosed but  35 pack year smoker) Continue with nebulizer. Start IV solumedrol.  Treating PNA.  Schedule nebulizer Q 6 hours.  Smoking cessation counseling   2-PNA; Continue with Ceftriaxone and hydralazine.   3-Anemia; history of AVM, iron deficiency;  S/P 2 units PRBC>  Iron 18, ferritin 15.  Will give IV iron.    4-Mildly elevated D dimer; Lower extremity edema; Doppler negative.   5-DM;  Continue with Lantus and SSI.   History Hereditary protein C deficiency. 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   HTN; Continue with hydralazine, coreg.  Will start Norvasc.   Hypothyroidism;  Continue with synthroid.  GERD; Protonix.   Hyperlipidemia Continue Repatha q 14 d as OP     DVT prophylaxis: SCD Code Status: Full code.  Family Communication: care discussed with patient.  Disposition Plan: home at time discharge.   Consultants:      Procedures:      Antimicrobials:   Ceftriaxone and azithromycin.    Subjective: She is still having SOB. She got upset when I was talking to her about smoking and COPD>    Objective: Vitals:   03/14/17 0200 03/14/17 0344  03/14/17 0608 03/14/17 0725  BP: (!) 167/67 (!) 188/65 (!) 166/87 (!) 176/70  Pulse: 75 74 87 83  Resp: (!) 22 20 18    Temp: 98.6 F (37 C) 98.8 F (37.1 C) 98 F (36.7 C)   TempSrc: Oral Oral Oral   SpO2: 93% 93% 96% 96%  Weight:   86.7 kg (191 lb 3.2 oz)     Intake/Output Summary (Last 24 hours) at 03/14/17 0734 Last data filed at 03/14/17 0346  Gross per 24 hour  Intake              940 ml  Output             1500 ml  Net             -560 ml   Filed Weights   03/14/17 0630  Weight: 86.7 kg (191 lb 3.2 oz)    Examination:  General exam: Appears calm and comfortable  Respiratory system: Bilateral expiratory wheezing.  Cardiovascular system: S1 & S2 heard, RRR. No JVD, murmurs, rubs, gallops or clicks. No pedal edema. Gastrointestinal system: Abdomen is nondistended, soft and nontender. No organomegaly or masses felt. Normal bowel sounds heard. Central nervous system: Alert and oriented. No focal neurological deficits. Extremities: Symmetric 5 x 5 power. Skin: No rashes, lesions or ulcers Psychiatry: Judgement and insight appear normal. Mood & affect appropriate.     Data Reviewed: I have personally reviewed following labs and imaging studies  CBC:  Recent Labs Lab 03/07/17 1533 03/13/17 0955 03/13/17 1555 03/14/17 0544  WBC 11.9* 11.7* 9.9 14.2*  NEUTROABS 9.7* 9.3*  --   --   HGB 8.2* 7.0* 6.5* 9.1*  HCT 29.0* 25.1* 22.9* 29.4*  MCV 74.4* 71.9* 71.6* 74.4*  PLT 254 265 279 160   Basic Metabolic Panel:  Recent Labs Lab 03/07/17 1533 03/13/17 0955  NA 139 137  K 3.9 4.1  CL  --  102  CO2 23 23  GLUCOSE 218* 179*  BUN 16.7 12  CREATININE 1.3* 1.00  CALCIUM 10.0 9.5   GFR: Estimated Creatinine Clearance: 60 mL/min (by C-G formula based on SCr of 1 mg/dL). Liver Function Tests:  Recent Labs Lab 03/07/17 1533 03/13/17 0955  AST 13 22  ALT 12 17  ALKPHOS 146 122  BILITOT 0.25 0.4  PROT 7.3 6.9  ALBUMIN 3.8 3.4*   No results for input(s):  LIPASE, AMYLASE in the last 168 hours. No results for input(s): AMMONIA in the last 168 hours. Coagulation Profile:  Recent Labs Lab 03/14/17 0544  INR 1.00   Cardiac Enzymes:  Recent Labs Lab 03/13/17 1849 03/14/17 0544  TROPONINI <0.03 <0.03   BNP (last 3 results) No results for input(s): PROBNP in the last 8760 hours. HbA1C:  Recent Labs  03/13/17 1859  HGBA1C 6.6*   CBG:  Recent Labs Lab 03/13/17 1527 03/13/17 2114 03/14/17 0724  GLUCAP 204* 337* 310*   Lipid Profile: No results for input(s): CHOL, HDL,  LDLCALC, TRIG, CHOLHDL, LDLDIRECT in the last 72 hours. Thyroid Function Tests:  Recent Labs  03/13/17 1658  TSH 2.250   Anemia Panel: No results for input(s): VITAMINB12, FOLATE, FERRITIN, TIBC, IRON, RETICCTPCT in the last 72 hours. Sepsis Labs:  Recent Labs Lab 03/13/17 1658 03/13/17 1858  PROCALCITON 0.10  --   LATICACIDVEN  --  1.7    Recent Results (from the past 240 hour(s))  Culture, sputum-assessment     Status: None   Collection Time: 03/13/17  6:19 PM  Result Value Ref Range Status   Specimen Description EXPECTORATED SPUTUM  Final   Special Requests NONE  Final   Sputum evaluation THIS SPECIMEN IS ACCEPTABLE FOR SPUTUM CULTURE  Final   Report Status 03/13/2017 FINAL  Final  Culture, respiratory (NON-Expectorated)     Status: None (Preliminary result)   Collection Time: 03/13/17  6:19 PM  Result Value Ref Range Status   Specimen Description EXPECTORATED SPUTUM  Final   Special Requests NONE Reflexed from [id number]  Final   Gram Stain   Final    ABUNDANT WBC PRESENT,BOTH PMN AND MONONUCLEAR FEW SQUAMOUS EPITHELIAL CELLS PRESENT FEW GRAM POSITIVE COCCI FEW GRAM POSITIVE RODS RARE YEAST RARE GRAM NEGATIVE RODS    Culture PENDING  Incomplete   Report Status PENDING  Incomplete         Radiology Studies: Dg Chest 2 View  Result Date: 03/13/2017 CLINICAL DATA:  Productive cough, fever, left side chest tightness EXAM: CHEST   2 VIEW COMPARISON:  04/01/2004 FINDINGS: Mild cardiomegaly. Peribronchial thickening and interstitial prominence in the lungs. No confluent opacities or effusions. No acute bony abnormality. IMPRESSION: Cardiomegaly. Peribronchial thickening and interstitial prominence. Differential considerations would include early interstitial edema, bronchitis, or atypical pneumonia. Electronically Signed   By: Rolm Baptise M.D.   On: 03/13/2017 11:32        Scheduled Meds: . cholecalciferol  1,000 Units Oral Daily  . cyclobenzaprine  10 mg Oral TID  . gabapentin  600 mg Oral BID  . hydrALAZINE  25 mg Oral TID  . insulin aspart  0-5 Units Subcutaneous QHS  . insulin aspart  0-9 Units Subcutaneous TID WC  . insulin glargine  54 Units Subcutaneous Daily  . ipratropium-albuterol  3 mL Nebulization BID  . levothyroxine  12.5 mcg Oral QAC breakfast  . metoprolol tartrate  50 mg Oral BID  . pantoprazole  40 mg Oral Daily  . predniSONE  60 mg Oral Q breakfast   Continuous Infusions: . sodium chloride    . azithromycin    . cefTRIAXone (ROCEPHIN)  IV Stopped (03/13/17 1803)     LOS: 1 day    Time spent: 35 minutes.     Elmarie Shiley, MD [facility] Pager [phone]  If 7PM-7AM, please contact night-coverage [email or web address] Password [email or web address] 03/14/2017, 7:34 AM

## 2017-03-14 NOTE — Progress Notes (Signed)
Inpatient Diabetes Program Recommendations  AACE/ADA: New Consensus Statement on Inpatient Glycemic Control (2015)  Target Ranges:  Prepandial:   less than 140 mg/dL      Peak postprandial:   less than 180 mg/dL (1-2 hours)      Critically ill patients:  140 - 180 mg/dL   Lab Results  Component Value Date   GLUCAP 310 (H) 03/14/2017   HGBA1C 6.6 (H) 03/13/2017   Review of Glycemic Control  Diabetes history: DM 2 Outpatient Diabetes medications: Toujeo 54 units, Humalog 20 units tid Current orders for Inpatient glycemic control: Lantus 54 untis, Nov Resistant 0-20 units tid  Inpatient Diabetes Program Recommendations:   Noted Novolog increased to Resistant Correction scale tid due to steroid hyperglycemia. Patient takes a large amount of meal coverage at home. Please also consider Novolog 5 units tid meal coverage in addition to correction scale if patient consumes at least 50% of meals.  A1c 6.6% on 10/29    Thanks,  Tama Headings RN, MSN, [facility] Inpatient Diabetes Coordinator Team Pager [phone] (8a-5p)

## 2017-03-14 NOTE — Progress Notes (Signed)
LE venous duplex has been completed. No DVT.  Preliminary results can be found: Chart review -> CV Proc  Ebunoluwa Gernert Eunice, RDMS, RVT   

## 2017-03-15 ENCOUNTER — Other Ambulatory Visit: Payer: Medicare Other

## 2017-03-15 ENCOUNTER — Ambulatory Visit: Payer: Medicare Other

## 2017-03-15 ENCOUNTER — Telehealth: Payer: Self-pay | Admitting: *Deleted

## 2017-03-15 ENCOUNTER — Ambulatory Visit: Payer: Medicare Other | Admitting: Hematology and Oncology

## 2017-03-15 DIAGNOSIS — J189 Pneumonia, unspecified organism: Principal | ICD-10-CM

## 2017-03-15 DIAGNOSIS — J441 Chronic obstructive pulmonary disease with (acute) exacerbation: Secondary | ICD-10-CM

## 2017-03-15 DIAGNOSIS — D500 Iron deficiency anemia secondary to blood loss (chronic): Secondary | ICD-10-CM

## 2017-03-15 LAB — GLUCOSE, CAPILLARY
Glucose-Capillary: 381 mg/dL — ABNORMAL HIGH (ref 65–99)
Glucose-Capillary: 341 mg/dL — ABNORMAL HIGH (ref 65–99)
Glucose-Capillary: 238 mg/dL — ABNORMAL HIGH (ref 65–99)
Glucose-Capillary: 246 mg/dL — ABNORMAL HIGH (ref 65–99)

## 2017-03-15 LAB — BASIC METABOLIC PANEL
Anion gap: 9 (ref 5–15)
BUN: 19 mg/dL (ref 6–20)
CO2: 26 mmol/L (ref 22–32)
Calcium: 10 mg/dL (ref 8.9–10.3)
Chloride: 98 mmol/L — ABNORMAL LOW (ref 101–111)
Creatinine, Ser: 1.15 mg/dL — ABNORMAL HIGH (ref 0.44–1.00)
GFR calc Af Amer: 56 mL/min — ABNORMAL LOW (ref >60)
GFR calc non Af Amer: 48 mL/min — ABNORMAL LOW (ref >60)
Glucose, Bld: 409 mg/dL — ABNORMAL HIGH (ref 65–99)
Potassium: 4.9 mmol/L (ref 3.5–5.1)
Sodium: 133 mmol/L — ABNORMAL LOW (ref 135–145)

## 2017-03-15 LAB — TYPE AND SCREEN
ABO/RH(D): B POS
Antibody Screen: NEGATIVE
Unit division: 0
Unit division: 0

## 2017-03-15 LAB — CBC
HCT: 27.9 % — ABNORMAL LOW (ref 36.0–46.0)
Hemoglobin: 8.2 g/dL — ABNORMAL LOW (ref 12.0–15.0)
MCH: 21.6 pg — ABNORMAL LOW (ref 26.0–34.0)
MCHC: 29.4 g/dL — ABNORMAL LOW (ref 30.0–36.0)
MCV: 73.4 fL — ABNORMAL LOW (ref 78.0–100.0)
Platelets: 305 10*3/uL (ref 150–400)
RBC: 3.8 MIL/uL — ABNORMAL LOW (ref 3.87–5.11)
RDW: 18.1 % — ABNORMAL HIGH (ref 11.5–15.5)
WBC: 23 10*3/uL — ABNORMAL HIGH (ref 4.0–10.5)

## 2017-03-15 LAB — CULTURE, RESPIRATORY W GRAM STAIN: Culture: NORMAL

## 2017-03-15 LAB — BPAM RBC
Blood Product Expiration Date: 201811042359
Blood Product Expiration Date: 201811082359
ISSUE DATE / TIME: 201810292011
ISSUE DATE / TIME: 201810300127
Unit Type and Rh: 7300
Unit Type and Rh: 7300

## 2017-03-15 LAB — LEGIONELLA PNEUMOPHILA SEROGP 1 UR AG: L. pneumophila Serogp 1 Ur Ag: NEGATIVE

## 2017-03-15 MED ORDER — ALBUTEROL SULFATE (2.5 MG/3ML) 0.083% IN NEBU: 2.5000 mg | INHALATION_SOLUTION | RESPIRATORY_TRACT | Status: DC | PRN

## 2017-03-15 MED ORDER — SENNA 8.6 MG PO TABS
2.0000 | ORAL_TABLET | Freq: Every day | ORAL | Status: DC
  Administered 2017-03-15 – 2017-03-17 (×3): 17.2 mg via ORAL
  Filled 2017-03-15 (×3): qty 2

## 2017-03-15 MED ORDER — INSULIN GLARGINE 100 UNIT/ML ~~LOC~~ SOLN: 60.0000 [IU] | Freq: Every day | SUBCUTANEOUS | Status: DC

## 2017-03-15 MED ORDER — INSULIN ASPART 100 UNIT/ML ~~LOC~~ SOLN
10.0000 [IU] | Freq: Three times a day (TID) | SUBCUTANEOUS | Status: DC
  Administered 2017-03-15 – 2017-03-16 (×3): 10 [IU] via SUBCUTANEOUS

## 2017-03-15 MED ORDER — INSULIN GLARGINE 100 UNIT/ML ~~LOC~~ SOLN: 10.0000 [IU] | Freq: Once | SUBCUTANEOUS | Status: DC

## 2017-03-15 MED ORDER — AZITHROMYCIN 500 MG PO TABS
500.0000 mg | ORAL_TABLET | Freq: Every day | ORAL | Status: AC
  Administered 2017-03-15 – 2017-03-17 (×3): 500 mg via ORAL
  Filled 2017-03-15 (×3): qty 1

## 2017-03-15 MED ORDER — POLYETHYLENE GLYCOL 3350 17 G PO PACK
17.0000 g | PACK | Freq: Every day | ORAL | Status: DC
  Administered 2017-03-15 – 2017-03-17 (×3): 17 g via ORAL
  Filled 2017-03-15 (×3): qty 1

## 2017-03-15 MED ORDER — METHYLPREDNISOLONE SODIUM SUCC 40 MG IJ SOLR
40.0000 mg | Freq: Two times a day (BID) | INTRAMUSCULAR | Status: DC
  Administered 2017-03-15 – 2017-03-16 (×3): 40 mg via INTRAVENOUS
  Filled 2017-03-15 (×3): qty 1

## 2017-03-15 MED ORDER — INSULIN GLARGINE 100 UNIT/ML ~~LOC~~ SOLN
64.0000 [IU] | Freq: Every day | SUBCUTANEOUS | Status: DC
  Administered 2017-03-15 – 2017-03-17 (×3): 64 [IU] via SUBCUTANEOUS
  Filled 2017-03-15 (×3): qty 0.64

## 2017-03-15 NOTE — Progress Notes (Signed)
Inpatient Diabetes Program Recommendations  AACE/ADA: New Consensus Statement on Inpatient Glycemic Control (2015)  Target Ranges:  Prepandial:   less than 140 mg/dL      Peak postprandial:   less than 180 mg/dL (1-2 hours)      Critically ill patients:  140 - 180 mg/dL   Results for [name], [name] (MRN [id number]) as of 03/15/2017 08:38  Ref. Range 03/14/2017 07:24 03/14/2017 11:39 03/14/2017 16:28 03/14/2017 21:24  Glucose-Capillary Latest Ref Range: 65 - 99 mg/dL 310 (H) 341 (H) 385 (H) 327 (H)   Results for Bethany, [name] (MRN [id number]) as of 03/15/2017 08:38  Ref. Range 03/15/2017 07:35  Glucose-Capillary Latest Ref Range: 65 - 99 mg/dL 381 (H)    Home DM Meds: Toujeo 54 units daily       Humalog 20 units Breakfast/ 24 units Lunch/ 20 units Dinner   Current Insulin Orders: Lantus 54 units daily      Novolog Resistant Correction Scale/ SSI (0-20 units) TID AC + HS       MD- Note patient getting Solumedrol 40 mg Q6 hours.  CBGs elevated >300 mg/dl.  Please consider the following in-hospital insulin adjustments:  1.  Increase Lantus to 65 units daily (20% increase)  2. Start Novolog Meal Coverage: Novolog 10 units TID with meals (50% home dose)     --Will follow patient during hospitalization--  Wyn Quaker RN, MSN, CDE Diabetes Coordinator Inpatient Glycemic Control Team Team Pager: [phone] (8a-5p)

## 2017-03-15 NOTE — Progress Notes (Signed)
Paged MD regarding pt pain medication. Pt states that "pain medication here is not being given like at home. At home I take it scheduled. Here no one is giving it to me." This RN informed pt that her pain medication was scheduled as  PRN medication and that she must ask for the medication, that it will not be given to her unless she requests it.

## 2017-03-15 NOTE — Progress Notes (Signed)
PROGRESS NOTE   Bethany [name]  XBM:[id number]    DOB: 25-Nov-1950    DOA: 03/13/2017  PCP: Wenda Low, MD   I have briefly reviewed patients previous medical records in [facility].  Brief Narrative:  66 year old female with PMH of DM 2/IDDM, HTN, protein C deficiency, ongoing tobacco abuse, denies COPD/asthma diagnosis (reports negative testing end of 2017), chronic iron deficiency anemia secondary to chronic AVM, requires blood transfusions every 4 months, recently moved from [place] to [place], follows with hematology (Dr. Beatriz Chancellor) and has an upcoming appointment with [facility] GI, presented with productive cough, worsening dyspnea and wheezing. She also had symptomatic anemia with hemoglobin down to 7. Admitted for presumed COPD exacerbation and symptomatic anemia. Improving.   Assessment & Plan:   Active Problems:   Iron deficiency anemia secondary to blood loss (chronic)   GI AVM (gastrointestinal arteriovenous vascular malformation)   Hereditary protein C deficiency (HCC)   Chronic kidney disease (CKD), stage III (moderate) (HCC)   Chronic headaches   Depression   Diabetes mellitus type 2 with neurological manifestations (HCC)   History of thrombosis   Hyperlipemia, mixed   History of colonic polyps   Community acquired pneumonia   COPD exacerbation (HCC)   Pneumonia   1. Presumed COPD exacerbation/acute viral bronchitis (rhinovirus +): Patient reports that she was tested for COPD/asthma and of 2017 and told that she did not have them. She however has long-standing history of tobacco abuse.? Asthmatic bronchitis. Had sick contact at home. Treated with IV Solu-Medrol, bronchodilator nebulizations and antibiotics. Improving. Reduced IV Solu-Medrol especially given marked hyperglycemia. Sputum culture showed normal OP flora. 2. Acute respiratory failure with hypoxia: Secondary to COPD exacerbation and pneumonia. Management of underlying etiology and wean oxygen as  possible. 3. Community-acquired pneumonia: Continue ceftriaxone and azithromycin. Follow-up chest x-ray in 4-6 weeks to ensure resolution of pneumonia findings. 4. Tobacco abuse: Cessation counseled. 5. Chronic iron deficiency anemia: Reportedly due to chronic blood loss related to AVM. Status post 2 units PRBC. Hemoglobin had gone up from 6.5-9.1 but is down to 8.2. Reports chronic intermittent dark stools. Not on iron supplements at home. Received IV iron. Ferritin was 15. Follow CBC in a.m. and transfuse if hemoglobin less than 8 g per DL. 6. Mildly elevated d-dimer: Unclear significance. Lower extremity venous Doppler negative. 7. Uncontrolled type II DM/IDDM: Now complicated by steroid use. Diabetes coordinator input appreciated. Increased Lantus dose to 64 units daily, added mealtime NovoLog 10 units and continue SSI. Should start to improve with tapering dose of steroids. 8. Protein C deficiency: Follows with outpatient hematology. 9. Stage II chronic kidney disease: Reports left nephrectomy after thrombotic event from protein C deficiency. Creatinine 1.15. 10. Essential hypertension: Mildly uncontrolled. Continue hydralazine and metoprolol. Amlodipine added. 11. Hypothyroid: Continue Synthroid. 12. GERD: PPI 13. Hyperlipidemia: Continue home medications. 14. Chronic pain: Follows with outpatient pain management. Judicious use of pain medications.   DVT prophylaxis: SCDs Code Status: Full Family Communication: Discussed in detail with patient's sister at bedside. Disposition: DC home when medically improved, possibly in 2-3 days.   Consultants:  None   Procedures:  None  Antimicrobials:  Ceftriaxone and azithromycin    Subjective: Patient reports that she is feeling better. Improved cough, dyspnea and wheezing. Some anterior chest wall pain on coughing. Cough is still mildly productive. No fever or chills. Chronic low back pain.   ROS: As above  Objective:  Vitals:    03/15/17 1049 03/15/17 1051 03/15/17 1100 03/15/17 1446  BP:   Marland Kitchen)  176/70   Pulse:   78   Resp:   20   Temp:   97.9 F (36.6 C)   TempSrc:   Oral   SpO2: (!) 87% 94% 95% 99%  Weight:        Examination:  General exam: Pleasant middle-aged female, moderately built and overweight, lying comfortably propped up in bed without distress. Respiratory system: Slightly harsh breath sounds bilaterally but no wheezing, rhonchi. Occasional basal crackles. Respiratory effort normal. Cardiovascular system: S1 & S2 heard, RRR. No JVD, murmurs, rubs, gallops or clicks. No pedal edema. Telemetry:  sinus rhythm. Gastrointestinal system: Abdomen is nondistended, soft and nontender. No organomegaly or masses felt. Normal bowel sounds heard. Central nervous system: Alert and oriented. No focal neurological deficits. Extremities: Symmetric 5 x 5 power. Skin: No rashes, lesions or ulcers Psychiatry: Judgement and insight appear normal. Mood & affect appropriate.     Data Reviewed: I have personally reviewed following labs and imaging studies  CBC:  Recent Labs Lab 03/13/17 0955 03/13/17 1555 03/14/17 0544 03/15/17 0405  WBC 11.7* 9.9 14.2* 23.0*  NEUTROABS 9.3*  --   --   --   HGB 7.0* 6.5* 9.1* 8.2*  HCT 25.1* 22.9* 29.4* 27.9*  MCV 71.9* 71.6* 74.4* 73.4*  PLT 265 279 272 956   Basic Metabolic Panel:  Recent Labs Lab 03/13/17 0955 03/15/17 0405  NA 137 133*  K 4.1 4.9  CL 102 98*  CO2 23 26  GLUCOSE 179* 409*  BUN 12 19  CREATININE 1.00 1.15*  CALCIUM 9.5 10.0   Liver Function Tests:  Recent Labs Lab 03/13/17 0955  AST 22  ALT 17  ALKPHOS 122  BILITOT 0.4  PROT 6.9  ALBUMIN 3.4*   Coagulation Profile:  Recent Labs Lab 03/14/17 0544  INR 1.00   Cardiac Enzymes:  Recent Labs Lab 03/13/17 1849 03/14/17 0544  TROPONINI <0.03 <0.03   HbA1C:  Recent Labs  03/13/17 1859  HGBA1C 6.6*   CBG:  Recent Labs Lab 03/14/17 1628 03/14/17 2124 03/15/17 0735  03/15/17 1141 03/15/17 1704  GLUCAP 385* 327* 381* 341* 238*    Recent Results (from the past 240 hour(s))  Respiratory Panel by PCR     Status: Abnormal   Collection Time: 03/13/17  1:21 PM  Result Value Ref Range Status   Adenovirus NOT DETECTED NOT DETECTED Final   Coronavirus 229E NOT DETECTED NOT DETECTED Final   Coronavirus HKU1 NOT DETECTED NOT DETECTED Final   Coronavirus NL63 NOT DETECTED NOT DETECTED Final   Coronavirus OC43 NOT DETECTED NOT DETECTED Final   Metapneumovirus NOT DETECTED NOT DETECTED Final   Rhinovirus / Enterovirus DETECTED (A) NOT DETECTED Final   Influenza A NOT DETECTED NOT DETECTED Final   Influenza B NOT DETECTED NOT DETECTED Final   Parainfluenza Virus 1 NOT DETECTED NOT DETECTED Final   Parainfluenza Virus 2 NOT DETECTED NOT DETECTED Final   Parainfluenza Virus 3 NOT DETECTED NOT DETECTED Final   Parainfluenza Virus 4 NOT DETECTED NOT DETECTED Final   Respiratory Syncytial Virus NOT DETECTED NOT DETECTED Final   Bordetella pertussis NOT DETECTED NOT DETECTED Final   Chlamydophila pneumoniae NOT DETECTED NOT DETECTED Final   Mycoplasma pneumoniae NOT DETECTED NOT DETECTED Final  Blood culture (routine x 2)     Status: None (Preliminary result)   Collection Time: 03/13/17  4:23 PM  Result Value Ref Range Status   Specimen Description BLOOD LEFT HAND  Final   Special Requests   Final  BOTTLES DRAWN AEROBIC ONLY Blood Culture adequate volume   Culture NO GROWTH 2 DAYS  Final   Report Status PENDING  Incomplete  Blood culture (routine x 2)     Status: None (Preliminary result)   Collection Time: 03/13/17  4:46 PM  Result Value Ref Range Status   Specimen Description BLOOD LEFT ANTECUBITAL  Final   Special Requests   Final    BOTTLES DRAWN AEROBIC AND ANAEROBIC Blood Culture adequate volume   Culture NO GROWTH 2 DAYS  Final   Report Status PENDING  Incomplete  Culture, sputum-assessment     Status: None   Collection Time: 03/13/17  6:19 PM   Result Value Ref Range Status   Specimen Description EXPECTORATED SPUTUM  Final   Special Requests NONE  Final   Sputum evaluation THIS SPECIMEN IS ACCEPTABLE FOR SPUTUM CULTURE  Final   Report Status 03/13/2017 FINAL  Final  Culture, respiratory (NON-Expectorated)     Status: None   Collection Time: 03/13/17  6:19 PM  Result Value Ref Range Status   Specimen Description EXPECTORATED SPUTUM  Final   Special Requests NONE Reflexed from [id number]  Final   Gram Stain   Final    ABUNDANT WBC PRESENT,BOTH PMN AND MONONUCLEAR FEW SQUAMOUS EPITHELIAL CELLS PRESENT FEW GRAM POSITIVE COCCI FEW GRAM POSITIVE RODS RARE YEAST RARE GRAM NEGATIVE RODS    Culture Consistent with normal respiratory flora.  Final   Report Status 03/15/2017 FINAL  Final         Radiology Studies: No results found.      Scheduled Meds: . amLODipine  5 mg Oral Daily  . azithromycin  500 mg Oral Daily  . cholecalciferol  1,000 Units Oral Daily  . cyclobenzaprine  10 mg Oral TID  . gabapentin  600 mg Oral BID  . guaiFENesin  600 mg Oral BID  . hydrALAZINE  25 mg Oral TID  . insulin aspart  0-20 Units Subcutaneous TID WC  . insulin aspart  0-5 Units Subcutaneous QHS  . insulin aspart  10 Units Subcutaneous TID WC  . insulin glargine  64 Units Subcutaneous Daily  . ipratropium-albuterol  3 mL Nebulization Q6H  . levothyroxine  12.5 mcg Oral QAC breakfast  . methylPREDNISolone (SOLU-MEDROL) injection  40 mg Intravenous Q12H  . metoprolol tartrate  50 mg Oral BID  . nicotine  21 mg Transdermal Daily  . pantoprazole  40 mg Oral Daily  . polyethylene glycol  17 g Oral Daily  . senna  2 tablet Oral Daily   Continuous Infusions: . sodium chloride    . cefTRIAXone (ROCEPHIN)  IV Stopped (03/15/17 1730)     LOS: 2 days     HONGALGI,ANAND, MD, FACP, FHM. [facility] Pager [phone]  If 7PM-7AM, please contact night-coverage [email or web address] Password [facility] 03/15/2017, 6:49 PM

## 2017-03-15 NOTE — Telephone Encounter (Signed)
Pt's sister, Cyndra Numbers called stating pt is still hospitalized, unable to make 10/31 apts.  Reviewed with Dr. Lebron Conners. Pt to be rescheduled in approximately 1 week for lab/MD/possible feraheme infusion.  Scheduling message sent to contact pt's sister to set up apts.

## 2017-03-16 ENCOUNTER — Inpatient Hospital Stay (HOSPITAL_COMMUNITY): Payer: Medicare Other

## 2017-03-16 DIAGNOSIS — J9601 Acute respiratory failure with hypoxia: Secondary | ICD-10-CM

## 2017-03-16 DIAGNOSIS — J208 Acute bronchitis due to other specified organisms: Secondary | ICD-10-CM

## 2017-03-16 LAB — GLUCOSE, CAPILLARY
Glucose-Capillary: 249 mg/dL — ABNORMAL HIGH (ref 65–99)
Glucose-Capillary: 314 mg/dL — ABNORMAL HIGH (ref 65–99)
Glucose-Capillary: 274 mg/dL — ABNORMAL HIGH (ref 65–99)
Glucose-Capillary: 318 mg/dL — ABNORMAL HIGH (ref 65–99)

## 2017-03-16 LAB — CBC
HCT: 30.4 % — ABNORMAL LOW (ref 36.0–46.0)
Hemoglobin: 8.7 g/dL — ABNORMAL LOW (ref 12.0–15.0)
MCH: 21.6 pg — ABNORMAL LOW (ref 26.0–34.0)
MCHC: 28.6 g/dL — ABNORMAL LOW (ref 30.0–36.0)
MCV: 75.4 fL — ABNORMAL LOW (ref 78.0–100.0)
Platelets: 321 10*3/uL (ref 150–400)
RBC: 4.03 MIL/uL (ref 3.87–5.11)
RDW: 18.6 % — ABNORMAL HIGH (ref 11.5–15.5)
WBC: 25.5 10*3/uL — ABNORMAL HIGH (ref 4.0–10.5)

## 2017-03-16 LAB — BASIC METABOLIC PANEL
Anion gap: 9 (ref 5–15)
BUN: 22 mg/dL — ABNORMAL HIGH (ref 6–20)
CO2: 27 mmol/L (ref 22–32)
Calcium: 10.1 mg/dL (ref 8.9–10.3)
Chloride: 99 mmol/L — ABNORMAL LOW (ref 101–111)
Creatinine, Ser: 1.14 mg/dL — ABNORMAL HIGH (ref 0.44–1.00)
GFR calc Af Amer: 56 mL/min — ABNORMAL LOW (ref >60)
GFR calc non Af Amer: 49 mL/min — ABNORMAL LOW (ref >60)
Glucose, Bld: 230 mg/dL — ABNORMAL HIGH (ref 65–99)
Potassium: 5.1 mmol/L (ref 3.5–5.1)
Sodium: 135 mmol/L (ref 135–145)

## 2017-03-16 MED ORDER — INSULIN ASPART 100 UNIT/ML ~~LOC~~ SOLN
14.0000 [IU] | Freq: Three times a day (TID) | SUBCUTANEOUS | Status: DC
  Administered 2017-03-16 – 2017-03-17 (×4): 14 [IU] via SUBCUTANEOUS

## 2017-03-16 MED ORDER — BENZONATATE 100 MG PO CAPS
200.0000 mg | ORAL_CAPSULE | Freq: Three times a day (TID) | ORAL | Status: DC
  Administered 2017-03-16 – 2017-03-17 (×4): 200 mg via ORAL
  Filled 2017-03-16 (×4): qty 2

## 2017-03-16 NOTE — Progress Notes (Signed)
PROGRESS NOTE   Bethany [name]  HYI:[id number]    DOB: [birth year]    DOA: 03/13/2017  PCP: Wenda Low, MD   I have briefly reviewed patients previous medical records in [facility].  Brief Narrative:  66 year old female with PMH of DM 2/IDDM, HTN, protein C deficiency, ongoing tobacco abuse, denies COPD/asthma diagnosis (reports negative testing end of 2017), chronic iron deficiency anemia secondary to chronic AVM, requires blood transfusions every 4 months, recently moved from [place] to [place], follows with hematology (Dr. Beatriz Chancellor) and has an upcoming appointment with [facility], presented with productive cough, worsening dyspnea and wheezing. She also had symptomatic anemia with hemoglobin down to 7. Admitted for presumed COPD exacerbation and symptomatic anemia. Improving.   Assessment & Plan:   Active Problems:   Iron deficiency anemia secondary to blood loss (chronic)   GI AVM (gastrointestinal arteriovenous vascular malformation)   Hereditary protein C deficiency (HCC)   Chronic kidney disease (CKD), stage III (moderate) (HCC)   Chronic headaches   Depression   Diabetes mellitus type 2 with neurological manifestations (HCC)   History of thrombosis   Hyperlipemia, mixed   History of colonic polyps   Community acquired pneumonia   COPD exacerbation (HCC)   Pneumonia   1. Presumed COPD exacerbation/acute viral bronchitis (rhinovirus +): Patient reports that she was tested for COPD/asthma end of 2017 and told that she did not have them. She however has long-standing history of tobacco abuse.? Asthmatic bronchitis. Had sick contact at home. Treated with IV Solu-Medrol, bronchodilator nebulizations and antibiotics. Improving. Reduced IV Solu-Medrol especially given marked hyperglycemia. Sputum culture showed normal OP flora.Continue IV steroids through today and then transitioned to oral prednisone taper at discharge possibly in the morning. Reviewed repeat chest x-ray:  Improving peribronchial thickening and interstitial prominence. She had not yet gotten the flutter valve, discussed with patient and RN. Advised patient and family that due to the viral illness, she may have gradual resolution of cough over several days or even a couple of weeks. Added Tessalon. 2. Acute respiratory failure with hypoxia: Secondary to COPD exacerbation and pneumonia. Management of underlying etiology and wean oxygen as possible. Saturating normally on room air at rest. Reassess prior to discharge regarding home oxygen requirement. 3. Community-acquired pneumonia: Continue ceftriaxone and azithromycin. Follow-up chest x-ray in 4-6 weeks to ensure resolution of pneumonia findings. Repeat chest x-ray shows no pneumonia findings. 4. Tobacco abuse: Cessation counseled. 5. Chronic iron deficiency anemia: Reportedly due to chronic blood loss related to AVM. Status post 2 units PRBC. Hemoglobin had gone up from 6.5-9.1 but is down to 8.2. Reports chronic intermittent dark stools. Not on iron supplements at home. Received IV iron. Ferritin was 15. Follow CBC in a.m. and transfuse if hemoglobin less than 8 g per DL. Hemoglobin up to 8.7 today. 6. Mildly elevated d-dimer: Unclear significance. Lower extremity venous Doppler negative. 7. Uncontrolled type II DM/IDDM: Now complicated by steroid use. Diabetes coordinator input appreciated. Increased Lantus dose to 64 units daily, added mealtime NovoLog 10 units and continue SSI. Better but still elevated. Increased mealtime NovoLog to 14 units. Showed improve after transitioning to oral prednisone taper. 8. Protein C deficiency: Follows with outpatient hematology. 9. Stage II chronic kidney disease: Reports left nephrectomy after thrombotic event from protein C deficiency. Creatinine 1.15. 10. Essential hypertension: Mildly uncontrolled. Continue hydralazine and metoprolol. Amlodipine added. 11. Hypothyroid: Continue Synthroid. 12. GERD:  PPI 13. Hyperlipidemia: Continue home medications. 14. Chronic pain: Follows with outpatient pain management. Judicious use  of pain medications. 15. Leukocytosis: Secondary to steroids.   DVT prophylaxis: SCDs Code Status: Full Family Communication: Discussed in detail with patient's sister at bedside. Disposition: DC home when medically improved, possibly 11/2   Consultants:  None   Procedures:  None  Antimicrobials:  Ceftriaxone and azithromycin    Subjective: Overall feels better. Ongoing mostly dry cough and unable to bring up sputum. Constipation but does not wish to try suppository. Dyspnea improving slowly.  ROS: As above  Objective:  Vitals:   03/16/17 0930 03/16/17 1100 03/16/17 1200 03/16/17 1507  BP: (!) 162/56  (!) 154/57   Pulse: 77  76   Resp:   18   Temp:   98.4 F (36.9 C)   TempSrc:   Oral   SpO2:  98% 98% 99%  Weight:        Examination:  General exam: Pleasant middle-aged female, moderately built and overweight, sitting up comfortably in bed this morning. Not on oxygen. Looks much improved compared to yesterday which is affirmed by her sister at bedside. Respiratory system: Improved breath sounds. Still slightly harsh. Occasional rhonchi posteriorly. No wheezing or stridor. No crackles. No increased work of breathing. Cardiovascular system: S1 & S2 heard, RRR. No JVD, murmurs, rubs, gallops or clicks. No pedal edema. Telemetry personally reviewed: Sinus rhythm. Gastrointestinal system: Abdomen is nondistended, soft and nontender. No organomegaly or masses felt. Normal bowel sounds heard. Stable. Central nervous system: Alert and oriented. No focal neurological deficits. Extremities: Symmetric 5 x 5 power. Skin: No rashes, lesions or ulcers Psychiatry: Judgement and insight appear normal. Mood & affect appropriate.     Data Reviewed: I have personally reviewed following labs and imaging studies  CBC:  Recent Labs Lab 03/13/17 0955  03/13/17 1555 03/14/17 0544 03/15/17 0405 03/16/17 0516  WBC 11.7* 9.9 14.2* 23.0* 25.5*  NEUTROABS 9.3*  --   --   --   --   HGB 7.0* 6.5* 9.1* 8.2* 8.7*  HCT 25.1* 22.9* 29.4* 27.9* 30.4*  MCV 71.9* 71.6* 74.4* 73.4* 75.4*  PLT 265 279 272 305 599   Basic Metabolic Panel:  Recent Labs Lab 03/13/17 0955 03/15/17 0405 03/16/17 0516  NA 137 133* 135  K 4.1 4.9 5.1  CL 102 98* 99*  CO2 23 26 27   GLUCOSE 179* 409* 230*  BUN 12 19 22*  CREATININE 1.00 1.15* 1.14*  CALCIUM 9.5 10.0 10.1   Liver Function Tests:  Recent Labs Lab 03/13/17 0955  AST 22  ALT 17  ALKPHOS 122  BILITOT 0.4  PROT 6.9  ALBUMIN 3.4*   Coagulation Profile:  Recent Labs Lab 03/14/17 0544  INR 1.00   Cardiac Enzymes:  Recent Labs Lab 03/13/17 1849 03/14/17 0544  TROPONINI <0.03 <0.03   HbA1C:  Recent Labs  03/13/17 1859  HGBA1C 6.6*   CBG:  Recent Labs Lab 03/15/17 1704 03/15/17 2204 03/16/17 0745 03/16/17 1110 03/16/17 1651  GLUCAP 238* 246* 249* 314* 274*    Recent Results (from the past 240 hour(s))  Respiratory Panel by PCR     Status: Abnormal   Collection Time: 03/13/17  1:21 PM  Result Value Ref Range Status   Adenovirus NOT DETECTED NOT DETECTED Final   Coronavirus 229E NOT DETECTED NOT DETECTED Final   Coronavirus HKU1 NOT DETECTED NOT DETECTED Final   Coronavirus NL63 NOT DETECTED NOT DETECTED Final   Coronavirus OC43 NOT DETECTED NOT DETECTED Final   Metapneumovirus NOT DETECTED NOT DETECTED Final   Rhinovirus / Enterovirus DETECTED (A)  NOT DETECTED Final   Influenza A NOT DETECTED NOT DETECTED Final   Influenza B NOT DETECTED NOT DETECTED Final   Parainfluenza Virus 1 NOT DETECTED NOT DETECTED Final   Parainfluenza Virus 2 NOT DETECTED NOT DETECTED Final   Parainfluenza Virus 3 NOT DETECTED NOT DETECTED Final   Parainfluenza Virus 4 NOT DETECTED NOT DETECTED Final   Respiratory Syncytial Virus NOT DETECTED NOT DETECTED Final   Bordetella pertussis  NOT DETECTED NOT DETECTED Final   Chlamydophila pneumoniae NOT DETECTED NOT DETECTED Final   Mycoplasma pneumoniae NOT DETECTED NOT DETECTED Final  Blood culture (routine x 2)     Status: None (Preliminary result)   Collection Time: 03/13/17  4:23 PM  Result Value Ref Range Status   Specimen Description BLOOD LEFT HAND  Final   Special Requests   Final    BOTTLES DRAWN AEROBIC ONLY Blood Culture adequate volume   Culture NO GROWTH 3 DAYS  Final   Report Status PENDING  Incomplete  Blood culture (routine x 2)     Status: None (Preliminary result)   Collection Time: 03/13/17  4:46 PM  Result Value Ref Range Status   Specimen Description BLOOD LEFT ANTECUBITAL  Final   Special Requests   Final    BOTTLES DRAWN AEROBIC AND ANAEROBIC Blood Culture adequate volume   Culture NO GROWTH 3 DAYS  Final   Report Status PENDING  Incomplete  Culture, sputum-assessment     Status: None   Collection Time: 03/13/17  6:19 PM  Result Value Ref Range Status   Specimen Description EXPECTORATED SPUTUM  Final   Special Requests NONE  Final   Sputum evaluation THIS SPECIMEN IS ACCEPTABLE FOR SPUTUM CULTURE  Final   Report Status 03/13/2017 FINAL  Final  Culture, respiratory (NON-Expectorated)     Status: None   Collection Time: 03/13/17  6:19 PM  Result Value Ref Range Status   Specimen Description EXPECTORATED SPUTUM  Final   Special Requests NONE Reflexed from [id number]  Final   Gram Stain   Final    ABUNDANT WBC PRESENT,BOTH PMN AND MONONUCLEAR FEW SQUAMOUS EPITHELIAL CELLS PRESENT FEW GRAM POSITIVE COCCI FEW GRAM POSITIVE RODS RARE YEAST RARE GRAM NEGATIVE RODS    Culture Consistent with normal respiratory flora.  Final   Report Status 03/15/2017 FINAL  Final         Radiology Studies: Dg Chest 2 View  Result Date: 03/16/2017 CLINICAL DATA:  Productive cough for 5 months. Shortness of breath, fever, chills EXAM: CHEST  2 VIEW COMPARISON:  03/13/2017 FINDINGS: Cardiomegaly. Improving  peribronchial thickening and interstitial prominence. No confluent opacities or effusions. No acute bony abnormality. IMPRESSION: Improving peribronchial thickening and interstitial prominence. Stable cardiomegaly. Electronically Signed   By: Rolm Baptise M.D.   On: 03/16/2017 12:29        Scheduled Meds: . amLODipine  5 mg Oral Daily  . azithromycin  500 mg Oral Daily  . benzonatate  200 mg Oral TID  . cholecalciferol  1,000 Units Oral Daily  . cyclobenzaprine  10 mg Oral TID  . gabapentin  600 mg Oral BID  . guaiFENesin  600 mg Oral BID  . hydrALAZINE  25 mg Oral TID  . insulin aspart  0-20 Units Subcutaneous TID WC  . insulin aspart  0-5 Units Subcutaneous QHS  . insulin aspart  14 Units Subcutaneous TID WC  . insulin glargine  64 Units Subcutaneous Daily  . ipratropium-albuterol  3 mL Nebulization Q6H  . levothyroxine  12.5  mcg Oral QAC breakfast  . methylPREDNISolone (SOLU-MEDROL) injection  40 mg Intravenous Q12H  . metoprolol tartrate  50 mg Oral BID  . nicotine  21 mg Transdermal Daily  . pantoprazole  40 mg Oral Daily  . polyethylene glycol  17 g Oral Daily  . senna  2 tablet Oral Daily   Continuous Infusions: . sodium chloride    . cefTRIAXone (ROCEPHIN)  IV Stopped (03/15/17 1730)     LOS: 3 days     Garnet Chatmon, MD, FACP, FHM. [facility] Pager [phone]  If 7PM-7AM, please contact night-coverage [email or web address] Password [email or web address] 03/16/2017, 5:01 PM

## 2017-03-16 NOTE — Progress Notes (Signed)
Inpatient Diabetes Program Recommendations  AACE/ADA: New Consensus Statement on Inpatient Glycemic Control (2015)  Target Ranges:  Prepandial:   less than 140 mg/dL      Peak postprandial:   less than 180 mg/dL (1-2 hours)      Critically ill patients:  140 - 180 mg/dL   Results for Bethany [name], Bethany [name] (MRN [id number]) as of 03/16/2017 07:53  Ref. Range 03/15/2017 07:35 03/15/2017 11:41 03/15/2017 17:04 03/15/2017 22:04  Glucose-Capillary Latest Ref Range: 65 - 99 mg/dL 381 (H) 341 (H) 238 (H) 246 (H)   Results for Bethany [name], Bethany [name] (MRN [id number]) as of 03/16/2017 07:53  Ref. Range 03/16/2017 07:45  Glucose-Capillary Latest Ref Range: 65 - 99 mg/dL 249 (H)   Home DM Meds: Toujeo 54 units daily                             Humalog 20 units Breakfast/ 24 units Lunch/ 20 units Dinner   Current Insulin Orders: Lantus 64 units daily                                       Novolog Resistant Correction Scale/ SSI (0-20 units) TID AC + HS        Novolog 10 units TID      MD- Note Lantus increased yesterday AM and Novolog 10 units TID started yesterday at 12pm.  Note that Solumedrol reduced to 40 mg BID last PM as well.  If CBGs continue to run high and IV steroids continue, may consider the following adjustments:  1. Increase Lantus slightly to 70 units daily  2. Increase Novolog Meal Coverage to: Novolog 12 units TID with meals (hold if pt eats <50% of meal)      --Will follow patient during hospitalization--  Wyn Quaker RN, MSN, CDE Diabetes Coordinator Inpatient Glycemic Control Team Team Pager: [phone] (8a-5p)

## 2017-03-17 LAB — CBC
HCT: 30.1 % — ABNORMAL LOW (ref 36.0–46.0)
Hemoglobin: 8.5 g/dL — ABNORMAL LOW (ref 12.0–15.0)
MCH: 21.3 pg — ABNORMAL LOW (ref 26.0–34.0)
MCHC: 28.2 g/dL — ABNORMAL LOW (ref 30.0–36.0)
MCV: 75.4 fL — ABNORMAL LOW (ref 78.0–100.0)
Platelets: 369 10*3/uL (ref 150–400)
RBC: 3.99 MIL/uL (ref 3.87–5.11)
RDW: 19.2 % — ABNORMAL HIGH (ref 11.5–15.5)
WBC: 25.6 10*3/uL — ABNORMAL HIGH (ref 4.0–10.5)

## 2017-03-17 LAB — GLUCOSE, CAPILLARY
Glucose-Capillary: 240 mg/dL — ABNORMAL HIGH (ref 65–99)
Glucose-Capillary: 225 mg/dL — ABNORMAL HIGH (ref 65–99)
Glucose-Capillary: 270 mg/dL — ABNORMAL HIGH (ref 65–99)

## 2017-03-17 MED ORDER — GUAIFENESIN-DM 100-10 MG/5ML PO SYRP: 5.0000 mL | ORAL_SOLUTION | ORAL | 0 refills | Status: DC | PRN

## 2017-03-17 MED ORDER — VITAMIN D 1000 UNITS PO TABS: 1000.0000 [IU] | ORAL_TABLET | ORAL | Status: DC

## 2017-03-17 MED ORDER — AMLODIPINE BESYLATE 5 MG PO TABS
5.0000 mg | ORAL_TABLET | Freq: Every day | ORAL | 0 refills | Status: DC
Start: 2017-03-18 — End: 2019-11-20

## 2017-03-17 MED ORDER — IPRATROPIUM-ALBUTEROL 20-100 MCG/ACT IN AERS: 1.0000 | INHALATION_SPRAY | Freq: Four times a day (QID) | RESPIRATORY_TRACT | 0 refills | Status: AC | PRN

## 2017-03-17 MED ORDER — POLYETHYLENE GLYCOL 3350 17 G PO PACK: 17.0000 g | PACK | Freq: Every day | ORAL | 0 refills | Status: DC

## 2017-03-17 MED ORDER — SENNA 8.6 MG PO TABS: 2.0000 | ORAL_TABLET | Freq: Every day | ORAL | 0 refills | Status: DC

## 2017-03-17 MED ORDER — GUAIFENESIN ER 600 MG PO TB12: 600.0000 mg | ORAL_TABLET | Freq: Two times a day (BID) | ORAL | 0 refills | Status: DC

## 2017-03-17 MED ORDER — PREDNISONE 10 MG PO TABS: ORAL_TABLET | ORAL | 0 refills | Status: DC

## 2017-03-17 MED ORDER — NICOTINE 21 MG/24HR TD PT24: 21.0000 mg | MEDICATED_PATCH | Freq: Every day | TRANSDERMAL | 0 refills | Status: DC

## 2017-03-17 MED ORDER — PREDNISONE 20 MG PO TABS
40.0000 mg | ORAL_TABLET | Freq: Every day | ORAL | Status: DC
  Administered 2017-03-17: 40 mg via ORAL
  Filled 2017-03-17: qty 2

## 2017-03-17 MED ORDER — BENZONATATE 200 MG PO CAPS: 200.0000 mg | ORAL_CAPSULE | Freq: Three times a day (TID) | ORAL | 0 refills | Status: DC

## 2017-03-17 NOTE — Discharge Instructions (Signed)

## 2017-03-17 NOTE — Progress Notes (Signed)
Was called by patient in the room after discharge instructions has been done.Patient was upset because apparently she is taking Gabapentin  600mg  3x a day ,(patient showed  the medication bottle stated the same)   and we are only giving it  2x a day. Verbalized "you are giving it to me wrong this is an incompetent hospital,". This Rn checked the home medication  reconcillation by pharmacy from admission and it is only 2x a day. I was trying to explain it to her but patient  Was very upset.

## 2017-03-17 NOTE — Progress Notes (Signed)
SATURATION QUALIFICATIONS: (This note is used to comply with regulatory documentation for home oxygen)  Patient Saturations on Room Air at Rest = 97%  Patient Saturations on Room Air while Ambulating = 89%   

## 2017-03-17 NOTE — Progress Notes (Signed)
Gave Discharge instruction to patient.  Discussed follow up appointments and medication.  Patient was upset due to difference in some of the medication not being correct and not being able to call in other medication.    Explained to patient the hospital is unable to prescribe long term pain medication.  Would have to talk to PCP.  Also patient need refill of medication from oncologist- would need to contact them.  Patient was not happy.  Discharge home with sister.

## 2017-03-17 NOTE — Discharge Summary (Signed)
Physician Discharge Summary  Bethany [name] OZD:[id number] DOB: 1951/04/18  PCP: Wenda Low, MD  Admit date: 03/13/2017 Discharge date: 03/17/2017  Recommendations for Outpatient Follow-up:  1. Dr. Wenda Low, PCP on 03/24/2017 at 11.45 am with repeat labs (CBC & BMP). 2. Dr. Grace Isaac, Hematology on 03/21/2017 at 9:40 am.  Home Health: None Equipment/Devices: None    Discharge Condition: Improved and stable  CODE STATUS: Full  Diet recommendation: Heart healthy & diabetic diet.  Discharge Diagnoses:  Active Problems:   Iron deficiency anemia secondary to blood loss (chronic)   GI AVM (gastrointestinal arteriovenous vascular malformation)   Hereditary protein C deficiency (HCC)   Chronic kidney disease (CKD), stage III (moderate) (HCC)   Chronic headaches   Depression   Diabetes mellitus type 2 with neurological manifestations (HCC)   History of thrombosis   Hyperlipemia, mixed   History of colonic polyps   Community acquired pneumonia   COPD exacerbation ([place])   Pneumonia   Brief Summary: 66 year old female with PMH of DM 2/IDDM, HTN, protein C deficiency, ongoing tobacco abuse, denies COPD/asthma diagnosis (reports negative testing end of 2017), chronic iron deficiency anemia secondary to chronic AVM, requires blood transfusions every 4 months, recently moved from [place] to [place], follows with Hematology (Dr. Beatriz Chancellor) and has an upcoming appointment with [facility], who presented with productive cough, worsening dyspnea and wheezing. She also had symptomatic anemia with hemoglobin down to 7. Admitted for presumed COPD exacerbation and symptomatic anemia.    Assessment & Plan:   1. Presumed COPD exacerbation/acute viral bronchitis (rhinovirus +): Patient reports that she was tested for COPD/asthma end of 2017 and told that she did not have them. She however has long-standing history of tobacco abuse.? Asthmatic bronchitis. Had sick contact at home. Treated with  IV Solu-Medrol, bronchodilator nebulizations and antibiotics. Sputum culture showed normal OP flora. Reviewed repeat chest x-ray: Improving peribronchial thickening and interstitial prominence. She gradually improved. IV steroids were tapered. Flutter valve provided. On day of discharge, patient was not hypoxic even with activity. She completed course of antibiotics. She was transitioned to oral prednisone taper at discharge and when necessary Combivent inhaler. Advised patient and family that due to the viral illness, she may have gradual resolution of cough over several days or even a couple of weeks. Added Tessalon which helped with the cough. She and family were instructed that she should see a pulmonologist as an outpatient after her current acute illness has completely resolved for further evaluation regarding asthma/COPD. They verbalized understanding. 2. Acute respiratory failure with hypoxia: Secondary to COPD exacerbation and pneumonia. Resolved. 3. Community-acquired pneumonia: Treated with IV ceftriaxone and oral azithromycin. She completed 5 days course of antibiotics and no antibiotics at discharge. Follow-up chest x-ray in 4-6 weeks to ensure resolution of pneumonia findings. Repeat chest x-ray shows no pneumonia findings. Blood cultures 2: Negative, final report. 4. Tobacco abuse: Cessation counseled. Continue nicotine patch. 5. Chronic iron deficiency anemia: Reportedly due to chronic blood loss related to AVM. Status post 2 units PRBC. Reports chronic intermittent dark stools. Not on iron supplements at home. Received IV iron. Ferritin was 15. Hemoglobin remained stable in the mid 8 g range. She has upcoming follow-up appointment with Hematology as well as [facility]. 6. Mildly elevated d-dimer: Unclear significance. Lower extremity venous Doppler negative. 7. Uncontrolled type II DM/IDDM: Now complicated by steroid use. Diabetes coordinator input appreciated. Insulin's including Lantus and  NovoLog were adjusted in the hospital with improved but not optimal control. She will  continue prior home dose of insulin's and with rapid tapering and discontinuation of her prednisone, her glycemic control should improve. This was discussed with patient and she verbalized understanding. 8. Protein C deficiency: Follows with outpatient hematology. 9. Stage II chronic kidney disease: Reports left nephrectomy after thrombotic event from protein C deficiency. Creatinine 1.15. Periodically follow BMP as outpatient. 10. Essential hypertension: Mildly uncontrolled. Continue hydralazine and metoprolol. Amlodipine added. 11. Hypothyroid: Continue Synthroid. 12. GERD: PPI 13. Hyperlipidemia: Continue home medications. 14. Chronic pain: Follows with outpatient pain management. Judicious use of pain medications. 15. Leukocytosis: Secondary to steroids. Follow CBCs as outpatient. 16. Obesity/Body mass index is 31.05 kg/m.     Consultants:  None   Procedures:  None    Discharge Instructions  Discharge Instructions    Call MD for:  difficulty breathing, headache or visual disturbances    Complete by:  As directed    Call MD for:  extreme fatigue    Complete by:  As directed    Call MD for:  persistant dizziness or light-headedness    Complete by:  As directed    Call MD for:  severe uncontrolled pain    Complete by:  As directed    Call MD for:  temperature >100.4    Complete by:  As directed    Diet - low sodium heart healthy    Complete by:  As directed    Diet Carb Modified    Complete by:  As directed    Increase activity slowly    Complete by:  As directed        Medication List    STOP taking these medications   cyclobenzaprine 10 MG tablet Commonly known as:  FLEXERIL     TAKE these medications   amLODipine 5 MG tablet Commonly known as:  NORVASC Take 1 tablet (5 mg total) by mouth daily.   benzonatate 200 MG capsule Commonly known as:  TESSALON Take 1 capsule  (200 mg total) by mouth 3 (three) times daily.   cholecalciferol 1000 units tablet Commonly known as:  VITAMIN D Take 1 tablet (1,000 Units total) by mouth once a week. What changed:  medication strength  how much to take  when to take this   EQ MULTIVITAMINS ADULT GUMMY Chew Chew 1 tablet by mouth daily.   Gabapentin (Once-Daily) 600 MG Tabs Take 600 mg by mouth 2 (two) times daily.   guaiFENesin 600 MG 12 hr tablet Commonly known as:  MUCINEX Take 1 tablet (600 mg total) by mouth 2 (two) times daily.   guaiFENesin-dextromethorphan 100-10 MG/5ML syrup Commonly known as:  ROBITUSSIN DM Take 5 mLs by mouth every 4 (four) hours as needed for cough.   HUMALOG KWIKPEN 100 UNIT/ML KiwkPen Generic drug:  insulin lispro Inject 20 Units into the skin 3 (three) times daily. Inject 20 units with breakfast, 24 units with lunch, 20 units with dinner   hydrALAZINE 25 MG tablet Commonly known as:  APRESOLINE Take 25 mg by mouth 3 (three) times daily.   HYDROcodone-acetaminophen 10-325 MG tablet Commonly known as:  NORCO Take 1 tablet by mouth every 6 (six) hours as needed for moderate pain.   Ipratropium-Albuterol 20-100 MCG/ACT Aers respimat Commonly known as:  COMBIVENT Inhale 1 puff into the lungs every 6 (six) hours as needed for wheezing or shortness of breath.   levothyroxine 25 MCG tablet Commonly known as:  SYNTHROID, LEVOTHROID Take 12.5 mcg by mouth daily before breakfast.   metoprolol tartrate 50 MG  tablet Commonly known as:  LOPRESSOR Take 50 mg by mouth 2 (two) times daily.   nicotine 21 mg/24hr patch Commonly known as:  NICODERM CQ - dosed in mg/24 hours Place 1 patch (21 mg total) onto the skin daily.   pantoprazole 40 MG tablet Commonly known as:  PROTONIX Take 40 mg by mouth daily.   polyethylene glycol packet Commonly known as:  MIRALAX / GLYCOLAX Take 17 g by mouth daily.   predniSONE 10 MG tablet Commonly known as:  DELTASONE Start 03/18/17: 4  tabs daily for 2 days, then 3 tabs daily for 2 days, then 2 tabs daily for 2 days, then 1 tab daily for 2 days, then stop.   REPATHA SURECLICK 169 MG/ML Soaj Generic drug:  Evolocumab Inject 75 mg into the skin every 14 (fourteen) days.   senna 8.6 MG Tabs tablet Commonly known as:  SENOKOT Take 2 tablets (17.2 mg total) by mouth daily.   tiZANidine 2 MG tablet Commonly known as:  ZANAFLEX Take 2 mg by mouth at bedtime.   TOUJEO SOLOSTAR 300 UNIT/ML Sopn Generic drug:  Insulin Glargine Inject 54 Units into the skin daily.      Follow-up Information    Ardath Sax, MD Follow up.   Specialty:  Hematology and Oncology Contact information: [place] [address] [phone]        Wenda Low, MD. Schedule an appointment as soon as possible for a visit in 5 day(s).   Specialty:  Internal Medicine Why:  To be seen with repeat labs (CBC & BMP). Contact information: [address] [place] [address] [phone]          Allergies  Allergen Reactions  . Contrast Media [Iodinated Diagnostic Agents]     Renal hypertension per physician      Procedures/Studies: Dg Chest 2 View  Result Date: 03/16/2017 CLINICAL DATA:  Productive cough for 5 months. Shortness of breath, fever, chills EXAM: CHEST  2 VIEW COMPARISON:  03/13/2017 FINDINGS: Cardiomegaly. Improving peribronchial thickening and interstitial prominence. No confluent opacities or effusions. No acute bony abnormality. IMPRESSION: Improving peribronchial thickening and interstitial prominence. Stable cardiomegaly. Electronically Signed   By: Rolm Baptise M.D.   On: 03/16/2017 12:29   Dg Chest 2 View  Result Date: 03/13/2017 CLINICAL DATA:  Productive cough, fever, left side chest tightness EXAM: CHEST  2 VIEW COMPARISON:  04/01/2004 FINDINGS: Mild cardiomegaly. Peribronchial thickening and interstitial prominence in the lungs. No confluent opacities or effusions. No acute  bony abnormality. IMPRESSION: Cardiomegaly. Peribronchial thickening and interstitial prominence. Differential considerations would include early interstitial edema, bronchitis, or atypical pneumonia. Electronically Signed   By: Rolm Baptise M.D.   On: 03/13/2017 11:32      Subjective: Patient continues to feel better. Cough improved after addition of Tessalon. Dyspnea significantly improved. Able to ambulate in the hall without oxygen and oxygen saturation stayed 89% and above. No chest pain reported. Had BM. Feels comfortable going home.  Discharge Exam:  Vitals:   03/17/17 0141 03/17/17 0622 03/17/17 0823 03/17/17 1027  BP:  (!) 167/65  (!) 179/66  Pulse: 76 62  71  Resp: 16 18    Temp:  98 F (36.7 C)    TempSrc:  Oral    SpO2: 92% 98% 98%   Weight:  86 kg (189 lb 8 oz)      General exam: Pleasant middle-aged female, moderately built and overweight, sitting up comfortably in bed this morning. Does  not appear in any distress. Respiratory system:  overall much improved compared to a couple days ago. Good breath sounds bilaterally. Occasional rhonchi posteriorly. No crackles. No increased work of breathing. Able to speak in full sentences without difficulty. Cardiovascular system: S1 & S2 heard, RRR. No JVD, murmurs, rubs, gallops or clicks. No pedal edema. Telemetry personally reviewed: Sinus rhythm. Gastrointestinal system: Abdomen is nondistended, soft and nontender. No organomegaly or masses felt. Normal bowel sounds heard.  Central nervous system: Alert and oriented. No focal neurological deficits. Extremities: Symmetric 5 x 5 power. Skin: No rashes, lesions or ulcers Psychiatry: Judgement and insight appear normal. Mood & affect appropriate.       The results of significant diagnostics from this hospitalization (including imaging, microbiology, ancillary and laboratory) are listed below for reference.     Microbiology: Recent Results (from the past 240 hour(s))   Respiratory Panel by PCR     Status: Abnormal   Collection Time: 03/13/17  1:21 PM  Result Value Ref Range Status   Adenovirus NOT DETECTED NOT DETECTED Final   Coronavirus 229E NOT DETECTED NOT DETECTED Final   Coronavirus HKU1 NOT DETECTED NOT DETECTED Final   Coronavirus NL63 NOT DETECTED NOT DETECTED Final   Coronavirus OC43 NOT DETECTED NOT DETECTED Final   Metapneumovirus NOT DETECTED NOT DETECTED Final   Rhinovirus / Enterovirus DETECTED (A) NOT DETECTED Final   Influenza A NOT DETECTED NOT DETECTED Final   Influenza B NOT DETECTED NOT DETECTED Final   Parainfluenza Virus 1 NOT DETECTED NOT DETECTED Final   Parainfluenza Virus 2 NOT DETECTED NOT DETECTED Final   Parainfluenza Virus 3 NOT DETECTED NOT DETECTED Final   Parainfluenza Virus 4 NOT DETECTED NOT DETECTED Final   Respiratory Syncytial Virus NOT DETECTED NOT DETECTED Final   Bordetella pertussis NOT DETECTED NOT DETECTED Final   Chlamydophila pneumoniae NOT DETECTED NOT DETECTED Final   Mycoplasma pneumoniae NOT DETECTED NOT DETECTED Final  Blood culture (routine x 2)     Status: None (Preliminary result)   Collection Time: 03/13/17  4:23 PM  Result Value Ref Range Status   Specimen Description BLOOD LEFT HAND  Final   Special Requests   Final    BOTTLES DRAWN AEROBIC ONLY Blood Culture adequate volume   Culture NO GROWTH 4 DAYS  Final   Report Status PENDING  Incomplete  Blood culture (routine x 2)     Status: None (Preliminary result)   Collection Time: 03/13/17  4:46 PM  Result Value Ref Range Status   Specimen Description BLOOD LEFT ANTECUBITAL  Final   Special Requests   Final    BOTTLES DRAWN AEROBIC AND ANAEROBIC Blood Culture adequate volume   Culture NO GROWTH 4 DAYS  Final   Report Status PENDING  Incomplete  Culture, sputum-assessment     Status: None   Collection Time: 03/13/17  6:19 PM  Result Value Ref Range Status   Specimen Description EXPECTORATED SPUTUM  Final   Special Requests NONE  Final    Sputum evaluation THIS SPECIMEN IS ACCEPTABLE FOR SPUTUM CULTURE  Final   Report Status 03/13/2017 FINAL  Final  Culture, respiratory (NON-Expectorated)     Status: None   Collection Time: 03/13/17  6:19 PM  Result Value Ref Range Status   Specimen Description EXPECTORATED SPUTUM  Final   Special Requests NONE Reflexed from [id number]  Final   Gram Stain   Final    ABUNDANT WBC PRESENT,BOTH PMN AND MONONUCLEAR FEW SQUAMOUS EPITHELIAL CELLS PRESENT FEW GRAM POSITIVE  COCCI FEW GRAM POSITIVE RODS RARE YEAST RARE GRAM NEGATIVE RODS    Culture Consistent with normal respiratory flora.  Final   Report Status 03/15/2017 FINAL  Final     Labs: CBC:  Recent Labs Lab 03/13/17 0955 03/13/17 1555 03/14/17 0544 03/15/17 0405 03/16/17 0516 03/17/17 0325  WBC 11.7* 9.9 14.2* 23.0* 25.5* 25.6*  NEUTROABS 9.3*  --   --   --   --   --   HGB 7.0* 6.5* 9.1* 8.2* 8.7* 8.5*  HCT 25.1* 22.9* 29.4* 27.9* 30.4* 30.1*  MCV 71.9* 71.6* 74.4* 73.4* 75.4* 75.4*  PLT 265 279 272 305 321 676   Basic Metabolic Panel:  Recent Labs Lab 03/13/17 0955 03/15/17 0405 03/16/17 0516  NA 137 133* 135  K 4.1 4.9 5.1  CL 102 98* 99*  CO2 23 26 27   GLUCOSE 179* 409* 230*  BUN 12 19 22*  CREATININE 1.00 1.15* 1.14*  CALCIUM 9.5 10.0 10.1   Liver Function Tests:  Recent Labs Lab 03/13/17 0955  AST 22  ALT 17  ALKPHOS 122  BILITOT 0.4  PROT 6.9  ALBUMIN 3.4*   BNP (last 3 results)  Recent Labs  03/13/17 1658  BNP 211.5*   Cardiac Enzymes:  Recent Labs Lab 03/13/17 1849 03/14/17 0544  TROPONINI <0.03 <0.03   CBG:  Recent Labs Lab 03/16/17 1651 03/16/17 2057 03/17/17 0638 03/17/17 0747 03/17/17 1131  GLUCAP 274* 318* 240* 225* 270*      Time coordinating discharge: Over 30 minutes  SIGNED:  Vernell Leep, MD, FACP, FHM. [facility] Pager [phone]  If 7PM-7AM, please contact night-coverage [email or web address] Password [email or web address] 03/17/2017, 2:25 PM

## 2017-03-17 NOTE — Care Management Important Message (Signed)
Important Message  Patient Details  Name: Bethany [name] MRN: [id number] Date of Birth: 12/01/1950   Medicare Important Message Given:  Yes    Orbie Pyo 03/17/2017, 11:37 AM

## 2017-03-18 LAB — CULTURE, BLOOD (ROUTINE X 2)
Culture: NO GROWTH
Culture: NO GROWTH
Special Requests: ADEQUATE
Special Requests: ADEQUATE

## 2017-03-20 ENCOUNTER — Telehealth: Payer: Self-pay | Admitting: Hematology and Oncology

## 2017-03-20 NOTE — Telephone Encounter (Signed)
Called patient regarding appointments

## 2017-03-21 ENCOUNTER — Ambulatory Visit (HOSPITAL_COMMUNITY)
Admission: RE | Admit: 2017-03-21 | Discharge: 2017-03-21 | Disposition: A | Discharge status: Home / Self Care | Payer: Medicare Other | Source: Ambulatory Visit | Attending: Hematology and Oncology | Admitting: Hematology and Oncology

## 2017-03-21 ENCOUNTER — Other Ambulatory Visit (HOSPITAL_BASED_OUTPATIENT_CLINIC_OR_DEPARTMENT_OTHER): Payer: Medicare Other

## 2017-03-21 ENCOUNTER — Other Ambulatory Visit: Payer: Self-pay

## 2017-03-21 ENCOUNTER — Telehealth: Payer: Self-pay

## 2017-03-21 ENCOUNTER — Encounter: Payer: Self-pay | Admitting: Hematology and Oncology

## 2017-03-21 ENCOUNTER — Ambulatory Visit (HOSPITAL_BASED_OUTPATIENT_CLINIC_OR_DEPARTMENT_OTHER): Payer: Medicare Other | Admitting: Hematology and Oncology

## 2017-03-21 VITALS — BP 158/78 | HR 76 | Temp 99.2°F | Resp 20 | Ht 65.5 in | Wt 190.6 lb

## 2017-03-21 DIAGNOSIS — D500 Iron deficiency anemia secondary to blood loss (chronic): Secondary | ICD-10-CM | POA: Diagnosis not present

## 2017-03-21 DIAGNOSIS — Q2733 Arteriovenous malformation of digestive system vessel: Secondary | ICD-10-CM

## 2017-03-21 DIAGNOSIS — K5520 Angiodysplasia of colon without hemorrhage: Secondary | ICD-10-CM

## 2017-03-21 LAB — CBC WITH DIFFERENTIAL/PLATELET
BASO%: 0.3 % (ref 0.0–2.0)
Basophils Absolute: 0.1 10*3/uL (ref 0.0–0.1)
EOS%: 0.3 % (ref 0.0–7.0)
Eosinophils Absolute: 0.1 10*3/uL (ref 0.0–0.5)
HCT: 31.7 % — ABNORMAL LOW (ref 34.8–46.6)
HGB: 9.5 g/dL — ABNORMAL LOW (ref 11.6–15.9)
LYMPH%: 5.1 % — ABNORMAL LOW (ref 14.0–49.7)
MCH: 22.8 pg — ABNORMAL LOW (ref 25.1–34.0)
MCHC: 30 g/dL — ABNORMAL LOW (ref 31.5–36.0)
MCV: 76.1 fL — ABNORMAL LOW (ref 79.5–101.0)
MONO#: 0.5 10*3/uL (ref 0.1–0.9)
MONO%: 2.4 % (ref 0.0–14.0)
NEUT#: 18.4 10*3/uL — ABNORMAL HIGH (ref 1.5–6.5)
NEUT%: 91.9 % — ABNORMAL HIGH (ref 38.4–76.8)
Platelets: 399 10*3/uL (ref 145–400)
RBC: 4.17 10*6/uL (ref 3.70–5.45)
RDW: 24.3 % — ABNORMAL HIGH (ref 11.2–14.5)
WBC: 19.9 10*3/uL — ABNORMAL HIGH (ref 3.9–10.3)
lymph#: 1 10*3/uL (ref 0.9–3.3)

## 2017-03-21 LAB — TECHNOLOGIST REVIEW: Technologist Review: 1

## 2017-03-21 NOTE — Telephone Encounter (Signed)
Printed avs and calender for upcoming appointment. Per 11/6 los 

## 2017-03-22 ENCOUNTER — Telehealth: Payer: Self-pay

## 2017-03-22 ENCOUNTER — Encounter (HOSPITAL_COMMUNITY): Payer: Medicare Other

## 2017-03-22 NOTE — Telephone Encounter (Signed)
Called and spoke with patient concerning her next upcoming appointnment was 11/19 and not the 28th. Per 11/7 sch message

## 2017-03-24 DIAGNOSIS — J441 Chronic obstructive pulmonary disease with (acute) exacerbation: Secondary | ICD-10-CM | POA: Diagnosis not present

## 2017-03-24 DIAGNOSIS — G8929 Other chronic pain: Secondary | ICD-10-CM | POA: Diagnosis not present

## 2017-03-24 DIAGNOSIS — J189 Pneumonia, unspecified organism: Secondary | ICD-10-CM | POA: Diagnosis not present

## 2017-03-24 DIAGNOSIS — I10 Essential (primary) hypertension: Secondary | ICD-10-CM | POA: Diagnosis not present

## 2017-03-24 DIAGNOSIS — E039 Hypothyroidism, unspecified: Secondary | ICD-10-CM | POA: Diagnosis not present

## 2017-03-24 DIAGNOSIS — Q2733 Arteriovenous malformation of digestive system vessel: Secondary | ICD-10-CM | POA: Diagnosis not present

## 2017-03-30 ENCOUNTER — Encounter: Payer: Self-pay | Admitting: Vascular Surgery

## 2017-03-30 ENCOUNTER — Other Ambulatory Visit: Payer: Self-pay | Admitting: Gastroenterology

## 2017-03-30 ENCOUNTER — Ambulatory Visit (INDEPENDENT_AMBULATORY_CARE_PROVIDER_SITE_OTHER): Payer: Medicare Other | Admitting: Vascular Surgery

## 2017-03-30 ENCOUNTER — Other Ambulatory Visit: Payer: Self-pay

## 2017-03-30 VITALS — BP 149/71 | HR 77 | Temp 99.1°F | Resp 16 | Ht 65.0 in | Wt 193.0 lb

## 2017-03-30 DIAGNOSIS — K922 Gastrointestinal hemorrhage, unspecified: Secondary | ICD-10-CM | POA: Diagnosis not present

## 2017-03-30 DIAGNOSIS — I700 Atherosclerosis of aorta: Secondary | ICD-10-CM | POA: Diagnosis not present

## 2017-03-30 DIAGNOSIS — I739 Peripheral vascular disease, unspecified: Secondary | ICD-10-CM | POA: Diagnosis not present

## 2017-03-30 DIAGNOSIS — D509 Iron deficiency anemia, unspecified: Secondary | ICD-10-CM | POA: Diagnosis not present

## 2017-03-30 DIAGNOSIS — Q2733 Arteriovenous malformation of digestive system vessel: Secondary | ICD-10-CM | POA: Diagnosis not present

## 2017-03-30 NOTE — Progress Notes (Signed)
[facility] Follow-up Visit:  Assessment: Iron deficiency anemia secondary to blood loss (chronic) 66 y.o. female with intestinal AVM history with associated chronic GI blood loss resulting in iron deficiency and blood loss anemia. Patient has been managed with parenteral iron supplementation blood transfusions as the AVMs were to locate. History has been complicated by arterial thrombosis event for which patient does not currently receiving therapeutic anticoagulation due to history of bleeding.  Shortly after our visit in the clinic, patient was admitted to the hospital with exacerbation of her anemia likely due to concurrent gastrointestinal bleeding and possible community-acquired pneumonia.  Patient was treated at the hospital and recovered after 2 units of packed red blood cells transfusion.  At this time, patient is back to her baseline and will return to all previous planned strategy.  "protein C deficiency": At the previous to the clinic, I had suspicion that the documented protein C deficiency in reality was the depletion of the protein C when the level was checked shortly after extensive thrombotic event.  Indeed, when rechecked, protein C values are completely normal both in the antigen quantity and and function.  The protein C deficiency problem removed from history and problem list.  Plan: --Based on the previous value, patient needs Venofer injection. --Blood support plan:  --QWk labs for now  --If Hgb <= 8.0 (based on patient becoming symptomatic from her anemia) -- transfuse 2 units pRBC and re-check Hgb. If not rising appropriately, admit to the hospital for additional transfusions  --If Hgb 8.1-9.0 -- transfuse 1 unit of pRBC and re-check Hgb  --If Hgb =>9.1 -- schedule monthly Venofer infusions to keep Ferritin >100 (CKD, chronic inflammatory state likely elevate the baseline). --R return to my clinic in 2 weeks with labs 2-3 weeks prior to assess iron levels  subsequently will check iron at monthly intervals until a more stable level is established  Voice recognition software was used and creation of this note. Despite my best effort at editing the text, some misspelling/errors may have occurred.  Orders Placed This Encounter  Procedures  . CBC with Differential    Standing Status:   Future    Standing Expiration Date:   03/21/2018  . Comprehensive metabolic panel    Standing Status:   Future    Standing Expiration Date:   03/21/2018  . Ferritin    Standing Status:   Future    Standing Expiration Date:   03/21/2018  . Iron and TIBC    Standing Status:   Future    Standing Expiration Date:   03/21/2018    Cancer Staging No matching staging information was found for the patient.  All questions were answered.  . The patient knows to call the clinic with any problems, questions or concerns.  This note was electronically signed.    History of Presenting Illness Bethany [name] is a 66 y.o. female followed in the [place] for assistance in management of chronic anemia due to running blood loss via AVMs in the GI tract. 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 Patient is not on therapeutic anticoagulation due to recurrent gastrointestinal bleeding. Additional past medical history includes insulin-dependent diabetes mellitus, hypertension, GERD, hypothyroidism, coronary artery disease. Patient also was diagnosed with protein C deficiency in the context of arterial clots.  At the present time, patient continues to smoke 0.5 packs per day which she has done over the past 40 years. She does not consume  alcohol. Patient reports low energy, generalized slowing of the activities of daily living, easy fatigability. Also reports cramping in the lower extremities with ambulation. Denies fevers, chills, night sweats. No active chest pain or shortness of breath at rest. No significant abdominal pain,  diarrhea, or constipation. No recurrent melena. Patient has been receiving support with parenteral iron supplementation and blood transfusions for management of her chronic anemia.  Following the last visit to the clinic, patient has presented with progressive anemia and was admitted to the hospital from 03/13/17 to 03/17/17 for transfusion support.  Present time, patient is feeling significantly better.  Chest discomfort and shortness of breath that were some of the presenting symptoms have resolved completely.  Oncological/hematological History: --Labs, 03/01/17: Hgb 8.8, MCV 73.3, MCH 22.0, RDW 18.4, Plt 318; tProt 7.1, Alb 4.2, Ca 10.4, Cr 1.2, AP 125; TSH 4.74 --Labs, 03/07/17: Hgb 8.2, MCV 74.4, MCH 21.0, RWD 17.4, Plt 254; Fe 18, FeSat 4%, TIBC 453, Ferritin 15, Folate 9.3, Vit B12 783 --Admission, 10/29-11/02/18: Discharge diagnoses  --community-acquired pneumonia, COPD exacerbation, anemia due to acute blood loss and chronic anemia due to chronic gastrointestinal bleeding.    --Labs:     Hgb 6.5, MCV 71.6, MCH 20.3, RDW 17.8, Plt 279;    --Treatment: 2U pRBC, prednisone   --Labs, 03/21/17: Hgb 9.5, MCV 76.1, MCH 22.8, RDW 24.3, Plt 399;    Medical History: Past Medical History:  Diagnosis Date  . Acute renal failure ([place])   . Arthritis   . Diabetes mellitus without complication ([place])   . Hypercalcemia   . Hypertension   . Renal disorder   . Single kidney   . Thrombosis     Surgical History: Past Surgical History:  Procedure Laterality Date  . ABDOMINAL HYSTERECTOMY    . CHOLECYSTECTOMY    . NEPHRECTOMY RECIPIENT      Family History: Family History  Problem Relation Age of Onset  . Anemia Mother   . Diabetes Father   . Hypertension Brother     Social History: Social History   Socioeconomic History  . Marital status: Single    Spouse name: Not on file  . Number of children: Not on file  . Years of education: Not on file  . Highest education level: Not on file   Social Needs  . Financial resource strain: Not on file  . Food insecurity - worry: Not on file  . Food insecurity - inability: Not on file  . Transportation needs - medical: Not on file  . Transportation needs - non-medical: Not on file  Occupational History  . Occupation: retired   Tobacco Use  . Smoking status: Current Every Day Smoker    Types: Cigarettes  . Smokeless tobacco: Never Used  Substance and Sexual Activity  . Alcohol use: No  . Drug use: No  . Sexual activity: Not on file  Other Topics Concern  . Not on file  Social History Narrative   Lives with daughter    Dorie Rank from [place]     Allergies: Allergies  Allergen Reactions  . Contrast Media [Iodinated Diagnostic Agents]     Renal hypertension per physician    Medications:  Current Outpatient Medications  Medication Sig Dispense Refill  . amLODipine (NORVASC) 5 MG tablet Take 1 tablet (5 mg total) by mouth daily. 30 tablet 0  . benzonatate (TESSALON) 200 MG capsule Take 1 capsule (200 mg total) by mouth 3 (three) times daily. 20 capsule 0  . cholecalciferol (VITAMIN D) 1000 units  tablet Take 1 tablet (1,000 Units total) by mouth once a week.    . Evolocumab (REPATHA SURECLICK) 510 MG/ML SOAJ Inject 75 mg into the skin every 14 (fourteen) days.    . Gabapentin, Once-Daily, 600 MG TABS Take 600 mg by mouth 2 (two) times daily.    Marland Kitchen guaiFENesin (MUCINEX) 600 MG 12 hr tablet Take 1 tablet (600 mg total) by mouth 2 (two) times daily. 15 tablet 0  . guaiFENesin-dextromethorphan (ROBITUSSIN DM) 100-10 MG/5ML syrup Take 5 mLs by mouth every 4 (four) hours as needed for cough. 118 mL 0  . hydrALAZINE (APRESOLINE) 25 MG tablet Take 25 mg by mouth 3 (three) times daily.    Marland Kitchen HYDROcodone-acetaminophen (NORCO) 10-325 MG tablet Take 1 tablet by mouth every 6 (six) hours as needed for moderate pain.    . Insulin Glargine (TOUJEO SOLOSTAR) 300 UNIT/ML SOPN Inject 54 Units into the skin daily.    . insulin lispro (HUMALOG KWIKPEN)  100 UNIT/ML KiwkPen Inject 20 Units into the skin 3 (three) times daily. Inject 20 units with breakfast, 24 units with lunch, 20 units with dinner    . Ipratropium-Albuterol (COMBIVENT) 20-100 MCG/ACT AERS respimat Inhale 1 puff into the lungs every 6 (six) hours as needed for wheezing or shortness of breath. 1 Inhaler 0  . levothyroxine (SYNTHROID, LEVOTHROID) 25 MCG tablet Take 12.5 mcg by mouth daily before breakfast.    . metoprolol (LOPRESSOR) 50 MG tablet Take 50 mg by mouth 2 (two) times daily.     . Multiple Vitamins-Minerals (EQ MULTIVITAMINS ADULT GUMMY) CHEW Chew 1 tablet by mouth daily.    . nicotine (NICODERM CQ - DOSED IN MG/24 HOURS) 21 mg/24hr patch Place 1 patch (21 mg total) onto the skin daily. 28 patch 0  . pantoprazole (PROTONIX) 40 MG tablet Take 40 mg by mouth daily.    . polyethylene glycol (MIRALAX / GLYCOLAX) packet Take 17 g by mouth daily. 14 each 0  . predniSONE (DELTASONE) 10 MG tablet Start 03/18/17: 4 tabs daily for 2 days, then 3 tabs daily for 2 days, then 2 tabs daily for 2 days, then 1 tab daily for 2 days, then stop. 20 tablet 0  . senna (SENOKOT) 8.6 MG TABS tablet Take 2 tablets (17.2 mg total) by mouth daily. 30 each 0  . tiZANidine (ZANAFLEX) 2 MG tablet Take 2 mg by mouth at bedtime.     No current facility-administered medications for this visit.     Review of Systems: Review of Systems  Constitutional: Positive for fatigue.  Musculoskeletal: Positive for myalgias.  All other systems reviewed and are negative.    PHYSICAL EXAMINATION Blood pressure (!) 158/78, pulse 76, temperature 99.2 F (37.3 C), temperature source Oral, resp. rate 20, height 5' 5.5" (1.664 m), weight 190 lb 9.6 oz (86.5 kg), SpO2 99 %.  ECOG PERFORMANCE STATUS: 2 - Symptomatic, <50% confined to bed  Physical Exam  Constitutional: She is oriented to person, place, and time and well-developed, well-nourished, and in no distress. No distress.  HENT:  Head: Normocephalic and  atraumatic.  Mouth/Throat: Oropharynx is clear and moist. No oropharyngeal exudate.  Eyes: Conjunctivae and EOM are normal. Pupils are equal, round, and reactive to light. No scleral icterus.  Neck: No thyromegaly present.  Cardiovascular: Normal rate, regular rhythm, normal heart sounds and intact distal pulses. Exam reveals no gallop.  No murmur heard. Pulmonary/Chest: Effort normal and breath sounds normal. No respiratory distress. She has no wheezes. She has no rales. She  exhibits no tenderness.  Abdominal: Soft. Bowel sounds are normal. She exhibits no distension and no mass. There is no tenderness. There is no rebound and no guarding.  Musculoskeletal: Normal range of motion. She exhibits no edema.  Lymphadenopathy:    She has no cervical adenopathy.  Neurological: She is alert and oriented to person, place, and time. She has normal reflexes. No cranial nerve deficit. Coordination normal.  Skin: No rash noted. She is not diaphoretic. No erythema. There is pallor.     LABORATORY DATA: I have personally reviewed the data as listed: Appointment on 03/21/2017  Component Date Value Ref Range Status  . WBC 03/21/2017 19.9* 3.9 - 10.3 10e3/uL Final  . NEUT# 03/21/2017 18.4* 1.5 - 6.5 10e3/uL Final  . HGB 03/21/2017 9.5* 11.6 - 15.9 g/dL Final  . HCT 03/21/2017 31.7* 34.8 - 46.6 % Final  . Platelets 03/21/2017 399  145 - 400 10e3/uL Final  . MCV 03/21/2017 76.1* 79.5 - 101.0 fL Final  . MCH 03/21/2017 22.8* 25.1 - 34.0 pg Final  . MCHC 03/21/2017 30.0* 31.5 - 36.0 g/dL Final  . RBC 03/21/2017 4.17  3.70 - 5.45 10e6/uL Final  . RDW 03/21/2017 24.3* 11.2 - 14.5 % Final  . lymph# 03/21/2017 1.0  0.9 - 3.3 10e3/uL Final  . MONO# 03/21/2017 0.5  0.1 - 0.9 10e3/uL Final  . Eosinophils Absolute 03/21/2017 0.1  0.0 - 0.5 10e3/uL Final  . Basophils Absolute 03/21/2017 0.1  0.0 - 0.1 10e3/uL Final  . NEUT% 03/21/2017 91.9* 38.4 - 76.8 % Final  . LYMPH% 03/21/2017 5.1* 14.0 - 49.7 % Final  .  MONO% 03/21/2017 2.4  0.0 - 14.0 % Final  . EOS% 03/21/2017 0.3  0.0 - 7.0 % Final  . BASO% 03/21/2017 0.3  0.0 - 2.0 % Final  . Technologist Review 03/21/2017 1 nrbc seen   Final  Admission on 03/13/2017, Discharged on 03/17/2017  No results displayed because visit has over 200 results.         Ardath Sax, MD

## 2017-03-30 NOTE — Progress Notes (Signed)
Patient name: Bethany [name] MRN: [id number] DOB: May 17, 1950 Sex: female   REASON FOR CONSULT:    To establish care locally.  The consult is requested by Dr. Lysle Rubens.   HPI:   Bethany [name] is a pleasant 66 y.o. female, who just moved here from [place].  Apparently, 2013 she was found to have a clot in her descending thoracic aorta which embolized to her left kidney and her spleen.  She required a left nephrectomy.  I do not have any of these reports or x-rays.  She last had a scan in March of this year in [place].  Again we do not have these records.  Her only complaint is some pain in both calves when she walks and this is more significant on the right side.  It is she does not have any thigh or hip claudication.  She does have some pain in the right hip which sounds arthritic as this occurs with standing.  She does smoke half a pack per day of cigarettes and is been smoking for 40 years.  She is not on aspirin or any other blood thinners as she has a history of an AVM.  She has a history of some mild chronic kidney disease.   I have reviewed the records that were sent with the patient.  The patient was seen by Dr. Deforest Hoyles on 03/01/2017.  The patient recently moved back to [place] from [place] and has multiple medical issues.  The patient reportedly has a history of "thrombosis" and therefore was in need of establishing vascular follow-up.  Past Medical History:  Diagnosis Date  . Acute renal failure ([place])   . Arthritis   . Diabetes mellitus without complication ([place])   . Hypercalcemia   . Hypertension   . Renal disorder   . Single kidney   . Thrombosis     Family History  Problem Relation Age of Onset  . Anemia Mother   . Diabetes Father   . Hypertension Brother     SOCIAL HISTORY: Social History   Socioeconomic History  . Marital status: Single    Spouse name: Not on file  . Number of children: Not on file  . Years of education: Not on file  . Highest education  level: Not on file  Social Needs  . Financial resource strain: Not on file  . Food insecurity - worry: Not on file  . Food insecurity - inability: Not on file  . Transportation needs - medical: Not on file  . Transportation needs - non-medical: Not on file  Occupational History  . Occupation: retired   Tobacco Use  . Smoking status: Current Every Day Smoker    Types: Cigarettes  . Smokeless tobacco: Never Used  Substance and Sexual Activity  . Alcohol use: No  . Drug use: No  . Sexual activity: Not on file  Other Topics Concern  . Not on file  Social History Narrative   Lives with daughter    Dorie Rank from [place]     Allergies  Allergen Reactions  . Contrast Media [Iodinated Diagnostic Agents]     Renal hypertension per physician    Current Outpatient Medications  Medication Sig Dispense Refill  . amLODipine (NORVASC) 5 MG tablet Take 1 tablet (5 mg total) by mouth daily. 30 tablet 0  . benzonatate (TESSALON) 200 MG capsule Take 1 capsule (200 mg total) by mouth 3 (three) times daily. 20 capsule 0  . cholecalciferol (VITAMIN D) 1000 units tablet  Take 1 tablet (1,000 Units total) by mouth once a week.    . Evolocumab (REPATHA SURECLICK) 161 MG/ML SOAJ Inject 75 mg into the skin every 14 (fourteen) days.    . Gabapentin, Once-Daily, 600 MG TABS Take 600 mg by mouth 2 (two) times daily.    Marland Kitchen guaiFENesin (MUCINEX) 600 MG 12 hr tablet Take 1 tablet (600 mg total) by mouth 2 (two) times daily. 15 tablet 0  . guaiFENesin-dextromethorphan (ROBITUSSIN DM) 100-10 MG/5ML syrup Take 5 mLs by mouth every 4 (four) hours as needed for cough. 118 mL 0  . hydrALAZINE (APRESOLINE) 25 MG tablet Take 25 mg by mouth 3 (three) times daily.    Marland Kitchen HYDROcodone-acetaminophen (NORCO) 10-325 MG tablet Take 1 tablet by mouth every 6 (six) hours as needed for moderate pain.    . Insulin Glargine (TOUJEO SOLOSTAR) 300 UNIT/ML SOPN Inject 54 Units into the skin daily.    . insulin lispro (HUMALOG KWIKPEN) 100  UNIT/ML KiwkPen Inject 20 Units into the skin 3 (three) times daily. Inject 20 units with breakfast, 24 units with lunch, 20 units with dinner    . Ipratropium-Albuterol (COMBIVENT) 20-100 MCG/ACT AERS respimat Inhale 1 puff into the lungs every 6 (six) hours as needed for wheezing or shortness of breath. 1 Inhaler 0  . levothyroxine (SYNTHROID, LEVOTHROID) 25 MCG tablet Take 12.5 mcg by mouth daily before breakfast.    . metoprolol (LOPRESSOR) 50 MG tablet Take 50 mg by mouth 2 (two) times daily.     . Multiple Vitamins-Minerals (EQ MULTIVITAMINS ADULT GUMMY) CHEW Chew 1 tablet by mouth daily.    . nicotine (NICODERM CQ - DOSED IN MG/24 HOURS) 21 mg/24hr patch Place 1 patch (21 mg total) onto the skin daily. 28 patch 0  . pantoprazole (PROTONIX) 40 MG tablet Take 40 mg by mouth daily.    . polyethylene glycol (MIRALAX / GLYCOLAX) packet Take 17 g by mouth daily. 14 each 0  . predniSONE (DELTASONE) 10 MG tablet Start 03/18/17: 4 tabs daily for 2 days, then 3 tabs daily for 2 days, then 2 tabs daily for 2 days, then 1 tab daily for 2 days, then stop. 20 tablet 0  . senna (SENOKOT) 8.6 MG TABS tablet Take 2 tablets (17.2 mg total) by mouth daily. 30 each 0  . tiZANidine (ZANAFLEX) 2 MG tablet Take 2 mg by mouth at bedtime.     No current facility-administered medications for this visit.     REVIEW OF SYSTEMS:  [X]  denotes positive finding, [ ]  denotes negative finding Cardiac  Comments:  Chest pain or chest pressure:    Shortness of breath upon exertion:    Short of breath when lying flat:    Irregular heart rhythm:        Vascular    Pain in calf, thigh, or hip brought on by ambulation: X   Pain in feet at night that wakes you up from your sleep:  X   Blood clot in your veins:    Leg swelling:         Pulmonary    Oxygen at home:    Productive cough:     Wheezing:         Neurologic    Sudden weakness in arms or legs:     Sudden numbness in arms or legs:     Sudden onset of  difficulty speaking or slurred speech:    Temporary loss of vision in one eye:     Problems  with dizziness:         Gastrointestinal    Blood in stool:  X   Vomited blood:         Genitourinary    Burning when urinating:     Blood in urine:        Psychiatric    Major depression:         Hematologic    Bleeding problems:    Problems with blood clotting too easily:        Skin    Rashes or ulcers:        Constitutional    Fever or chills:     PHYSICAL EXAM:   Vitals:   03/30/17 1401 03/30/17 1405  BP: (!) 157/71 (!) 149/71  Pulse: 79 77  Resp: 16   Temp: 99.1 F (37.3 C)   TempSrc: Oral   SpO2: 98%   Weight: 193 lb (87.5 kg)   Height: 5\' 5"  (1.651 m)     GENERAL: The patient is a well-nourished female, in no acute distress. The vital signs are documented above. CARDIAC: There is a regular rate and rhythm.  VASCULAR: I do not detect carotid bruits. On the right side she has a palpable femoral, popliteal pulse.  She has a weakly palpable dorsalis pedis and posterior tibial pulse.  She has a biphasic posterior tibial signal with the Doppler and a monophasic dorsalis pedis signal on the right with the Doppler. On the left side, she has a palpable femoral pulse.  I cannot palpate the popliteal or pedal pulses. She has no significant lower extremity swelling. PULMONARY: There is good air exchange bilaterally without wheezing or rales. ABDOMEN: Soft and non-tender with normal pitched bowel sounds.  I do not palpate an abdominal aortic aneurysm. MUSCULOSKELETAL: There are no major deformities or cyanosis. NEUROLOGIC: No focal weakness or paresthesias are detected. SKIN: I do not see any evidence of atheroembolic disease. PSYCHIATRIC: The patient has a normal affect.  DATA:    VENOUS DUPLEX: I did review the venous duplex scan that was done on 03/14/2017.  There was no evidence of DVT in either lower extremity.  There was a cystic structure noted in the popliteal fossa on  the right which is likely a Baker's cyst.  LABS: I reviewed the labs that were obtained on 03/02/2017.  Patient has a GFR of 53.  Her cholesterol is 246 with an LDL of 158.  MEDICAL ISSUES:   HISTORY OF ATHEROSCLEROSIS OF THE DESCENDING THORACIC AORTA: I do not have any her of her previous x-rays to review.  I have recommended that we proceed with a CT scan of the chest abdomen and pelvis with IV contrast to evaluate her atherosclerosis of the descending thoracic aorta and also her infrarenal aorta and iliac arteries.  She does have evidence of some infrainguinal arterial occlusive disease bilaterally but I am not convinced that her symptoms are related to this.  Her symptoms are worse on the right side yet her circulation is better on the right.  She does have some back issues and certainly some of this pain could be related to degenerative disc disease of her back.  She is a smoker and we have discussed the importance of tobacco cessation.  When she returns I will obtain baseline ABIs.  Once we have this information we can decide on the most appropriate interval for follow-up.  Deitra Mayo [facility] [employer] [phone]

## 2017-03-30 NOTE — Assessment & Plan Note (Addendum)
66 y.o. female with intestinal AVM history with associated chronic GI blood loss resulting in iron deficiency and blood loss anemia. Patient has been managed with parenteral iron supplementation blood transfusions as the AVMs were to locate. History has been complicated by arterial thrombosis event for which patient does not currently receiving therapeutic anticoagulation due to history of bleeding.  Shortly after our visit in the clinic, patient was admitted to the hospital with exacerbation of her anemia likely due to concurrent gastrointestinal bleeding and possible community-acquired pneumonia.  Patient was treated at the hospital and recovered after 2 units of packed red blood cells transfusion.  At this time, patient is back to her baseline and will return to all previous planned strategy.  "protein C deficiency": At the previous to the clinic, I had suspicion that the documented protein C deficiency in reality was the depletion of the protein C when the level was checked shortly after extensive thrombotic event.  Indeed, when rechecked, protein C values are completely normal both in the antigen quantity and and function.  The protein C deficiency problem removed from history and problem list.  Plan: --Based on the previous value, patient needs Venofer injection. --Blood support plan:  --QWk labs for now  --If Hgb <= 8.0 (based on patient becoming symptomatic from her anemia) -- transfuse 2 units pRBC and re-check Hgb. If not rising appropriately, admit to the hospital for additional transfusions  --If Hgb 8.1-9.0 -- transfuse 1 unit of pRBC and re-check Hgb  --If Hgb =>9.1 -- schedule monthly Venofer infusions to keep Ferritin >100 (CKD, chronic inflammatory state likely elevate the baseline). --R return to my clinic in 2 weeks with labs 2-3 weeks prior to assess iron levels subsequently will check iron at monthly intervals until a more stable level is established

## 2017-03-31 ENCOUNTER — Other Ambulatory Visit: Payer: Self-pay

## 2017-03-31 ENCOUNTER — Encounter (HOSPITAL_COMMUNITY): Payer: Self-pay | Admitting: *Deleted

## 2017-04-03 ENCOUNTER — Other Ambulatory Visit (HOSPITAL_BASED_OUTPATIENT_CLINIC_OR_DEPARTMENT_OTHER): Payer: Medicare Other

## 2017-04-03 DIAGNOSIS — K5520 Angiodysplasia of colon without hemorrhage: Secondary | ICD-10-CM

## 2017-04-03 DIAGNOSIS — Q2733 Arteriovenous malformation of digestive system vessel: Secondary | ICD-10-CM | POA: Diagnosis present

## 2017-04-03 DIAGNOSIS — D500 Iron deficiency anemia secondary to blood loss (chronic): Secondary | ICD-10-CM | POA: Diagnosis not present

## 2017-04-03 LAB — CBC WITH DIFFERENTIAL/PLATELET
BASO%: 0.9 % (ref 0.0–2.0)
Basophils Absolute: 0.1 10*3/uL (ref 0.0–0.1)
EOS%: 3.1 % (ref 0.0–7.0)
Eosinophils Absolute: 0.3 10*3/uL (ref 0.0–0.5)
HCT: 30.6 % — ABNORMAL LOW (ref 34.8–46.6)
HGB: 9.2 g/dL — ABNORMAL LOW (ref 11.6–15.9)
LYMPH%: 8 % — ABNORMAL LOW (ref 14.0–49.7)
MCH: 23.1 pg — ABNORMAL LOW (ref 25.1–34.0)
MCHC: 30 g/dL — ABNORMAL LOW (ref 31.5–36.0)
MCV: 77.2 fL — ABNORMAL LOW (ref 79.5–101.0)
MONO#: 0.6 10*3/uL (ref 0.1–0.9)
MONO%: 6 % (ref 0.0–14.0)
NEUT#: 8.1 10*3/uL — ABNORMAL HIGH (ref 1.5–6.5)
NEUT%: 82 % — ABNORMAL HIGH (ref 38.4–76.8)
Platelets: 476 10*3/uL — ABNORMAL HIGH (ref 145–400)
RBC: 3.96 10*6/uL (ref 3.70–5.45)
RDW: 26.1 % — ABNORMAL HIGH (ref 11.2–14.5)
WBC: 9.9 10*3/uL (ref 3.9–10.3)
lymph#: 0.8 10*3/uL — ABNORMAL LOW (ref 0.9–3.3)

## 2017-04-03 LAB — COMPREHENSIVE METABOLIC PANEL
ALT: 87 U/L — ABNORMAL HIGH (ref 0–55)
AST: 44 U/L — ABNORMAL HIGH (ref 5–34)
Albumin: 3.5 g/dL (ref 3.5–5.0)
Alkaline Phosphatase: 210 U/L — ABNORMAL HIGH (ref 40–150)
Anion Gap: 11 mEq/L (ref 3–11)
BUN: 19.8 mg/dL (ref 7.0–26.0)
CO2: 23 mEq/L (ref 22–29)
Calcium: 10.1 mg/dL (ref 8.4–10.4)
Chloride: 103 mEq/L (ref 98–109)
Creatinine: 1.5 mg/dL — ABNORMAL HIGH (ref 0.6–1.1)
EGFR: 42 mL/min/{1.73_m2} — ABNORMAL LOW (ref >60)
Glucose: 254 mg/dl — ABNORMAL HIGH (ref 70–140)
Potassium: 4.8 mEq/L (ref 3.5–5.1)
Sodium: 137 mEq/L (ref 136–145)
Total Bilirubin: 0.23 mg/dL (ref 0.20–1.20)
Total Protein: 7.1 g/dL (ref 6.4–8.3)

## 2017-04-03 LAB — IRON AND TIBC
%SAT: 11 % — ABNORMAL LOW (ref 21–57)
Iron: 39 ug/dL — ABNORMAL LOW (ref 41–142)
TIBC: 349 ug/dL (ref 236–444)
UIBC: 309 ug/dL (ref 120–384)

## 2017-04-03 LAB — FERRITIN: Ferritin: 79 ng/ml (ref 9–269)

## 2017-04-03 NOTE — Addendum Note (Signed)
Addended by: Lianne Cure A on: 04/03/2017 10:13 AM   Modules accepted: Orders

## 2017-04-04 ENCOUNTER — Telehealth: Payer: Self-pay | Admitting: Hematology and Oncology

## 2017-04-04 ENCOUNTER — Ambulatory Visit (HOSPITAL_BASED_OUTPATIENT_CLINIC_OR_DEPARTMENT_OTHER): Payer: Medicare Other | Admitting: Hematology and Oncology

## 2017-04-04 ENCOUNTER — Encounter: Payer: Self-pay | Admitting: Hematology and Oncology

## 2017-04-04 VITALS — BP 143/52 | HR 66 | Temp 99.1°F | Resp 18 | Ht 65.0 in | Wt 189.1 lb

## 2017-04-04 DIAGNOSIS — D500 Iron deficiency anemia secondary to blood loss (chronic): Secondary | ICD-10-CM

## 2017-04-04 DIAGNOSIS — Q2733 Arteriovenous malformation of digestive system vessel: Secondary | ICD-10-CM

## 2017-04-04 DIAGNOSIS — K5520 Angiodysplasia of colon without hemorrhage: Secondary | ICD-10-CM

## 2017-04-04 NOTE — Telephone Encounter (Signed)
Gave avs and calendar for December  °

## 2017-04-11 ENCOUNTER — Other Ambulatory Visit: Payer: Medicare Other

## 2017-04-12 ENCOUNTER — Ambulatory Visit: Payer: Medicare Other

## 2017-04-12 ENCOUNTER — Ambulatory Visit: Payer: Medicare Other | Admitting: Hematology and Oncology

## 2017-04-13 ENCOUNTER — Other Ambulatory Visit: Payer: Self-pay | Admitting: Gastroenterology

## 2017-04-17 ENCOUNTER — Ambulatory Visit (HOSPITAL_COMMUNITY): Payer: Medicare Other | Admitting: Registered Nurse

## 2017-04-17 ENCOUNTER — Other Ambulatory Visit: Payer: Self-pay

## 2017-04-17 ENCOUNTER — Ambulatory Visit (HOSPITAL_COMMUNITY)
Admission: RE | Admit: 2017-04-17 | Discharge: 2017-04-17 | Disposition: A | Discharge status: Home / Self Care | Payer: Medicare Other | Source: Ambulatory Visit | Attending: Hematology and Oncology | Admitting: Hematology and Oncology

## 2017-04-17 ENCOUNTER — Encounter (HOSPITAL_COMMUNITY): Payer: Self-pay

## 2017-04-17 ENCOUNTER — Encounter (HOSPITAL_COMMUNITY)
Admission: RE | Disposition: A | Discharge status: Home / Self Care | Payer: Self-pay | Source: Ambulatory Visit | Attending: Gastroenterology

## 2017-04-17 ENCOUNTER — Ambulatory Visit (HOSPITAL_COMMUNITY)
Admission: RE | Admit: 2017-04-17 | Discharge: 2017-04-17 | Disposition: A | Discharge status: Home / Self Care | Payer: Medicare Other | Source: Ambulatory Visit | Attending: Gastroenterology | Admitting: Gastroenterology

## 2017-04-17 DIAGNOSIS — D120 Benign neoplasm of cecum: Secondary | ICD-10-CM | POA: Diagnosis not present

## 2017-04-17 DIAGNOSIS — K219 Gastro-esophageal reflux disease without esophagitis: Secondary | ICD-10-CM | POA: Diagnosis not present

## 2017-04-17 DIAGNOSIS — D509 Iron deficiency anemia, unspecified: Secondary | ICD-10-CM | POA: Diagnosis not present

## 2017-04-17 DIAGNOSIS — Z91041 Radiographic dye allergy status: Secondary | ICD-10-CM | POA: Insufficient documentation

## 2017-04-17 DIAGNOSIS — I2510 Atherosclerotic heart disease of native coronary artery without angina pectoris: Secondary | ICD-10-CM | POA: Diagnosis not present

## 2017-04-17 DIAGNOSIS — M1990 Unspecified osteoarthritis, unspecified site: Secondary | ICD-10-CM | POA: Diagnosis not present

## 2017-04-17 DIAGNOSIS — E1122 Type 2 diabetes mellitus with diabetic chronic kidney disease: Secondary | ICD-10-CM | POA: Diagnosis not present

## 2017-04-17 DIAGNOSIS — N183 Chronic kidney disease, stage 3 (moderate): Secondary | ICD-10-CM | POA: Insufficient documentation

## 2017-04-17 DIAGNOSIS — E669 Obesity, unspecified: Secondary | ICD-10-CM | POA: Insufficient documentation

## 2017-04-17 DIAGNOSIS — Z6831 Body mass index (BMI) 31.0-31.9, adult: Secondary | ICD-10-CM | POA: Diagnosis not present

## 2017-04-17 DIAGNOSIS — E1140 Type 2 diabetes mellitus with diabetic neuropathy, unspecified: Secondary | ICD-10-CM | POA: Diagnosis not present

## 2017-04-17 DIAGNOSIS — I129 Hypertensive chronic kidney disease with stage 1 through stage 4 chronic kidney disease, or unspecified chronic kidney disease: Secondary | ICD-10-CM | POA: Insufficient documentation

## 2017-04-17 DIAGNOSIS — E7800 Pure hypercholesterolemia, unspecified: Secondary | ICD-10-CM | POA: Insufficient documentation

## 2017-04-17 DIAGNOSIS — E039 Hypothyroidism, unspecified: Secondary | ICD-10-CM | POA: Insufficient documentation

## 2017-04-17 DIAGNOSIS — J449 Chronic obstructive pulmonary disease, unspecified: Secondary | ICD-10-CM | POA: Insufficient documentation

## 2017-04-17 DIAGNOSIS — I781 Nevus, non-neoplastic: Secondary | ICD-10-CM | POA: Diagnosis not present

## 2017-04-17 DIAGNOSIS — K5730 Diverticulosis of large intestine without perforation or abscess without bleeding: Secondary | ICD-10-CM | POA: Diagnosis not present

## 2017-04-17 DIAGNOSIS — K2970 Gastritis, unspecified, without bleeding: Secondary | ICD-10-CM | POA: Diagnosis not present

## 2017-04-17 DIAGNOSIS — F329 Major depressive disorder, single episode, unspecified: Secondary | ICD-10-CM | POA: Diagnosis not present

## 2017-04-17 DIAGNOSIS — K640 First degree hemorrhoids: Secondary | ICD-10-CM | POA: Insufficient documentation

## 2017-04-17 HISTORY — PX: ESOPHAGOGASTRODUODENOSCOPY (EGD) WITH PROPOFOL: SHX5813

## 2017-04-17 HISTORY — PX: COLONOSCOPY WITH PROPOFOL: SHX5780

## 2017-04-17 LAB — GLUCOSE, CAPILLARY: Glucose-Capillary: 163 mg/dL — ABNORMAL HIGH (ref 65–99)

## 2017-04-17 SURGERY — COLONOSCOPY WITH PROPOFOL
Anesthesia: Monitor Anesthesia Care

## 2017-04-17 MED ORDER — PROPOFOL 10 MG/ML IV BOLUS
INTRAVENOUS | Status: AC
  Filled 2017-04-17: qty 20

## 2017-04-17 MED ORDER — SODIUM CHLORIDE 0.9 % IV SOLN
INTRAVENOUS | Status: DC
  Administered 2017-04-17: 09:00:00 via INTRAVENOUS

## 2017-04-17 MED ORDER — PROPOFOL 500 MG/50ML IV EMUL
INTRAVENOUS | Status: DC | PRN
  Administered 2017-04-17: 140 ug/kg/min via INTRAVENOUS

## 2017-04-17 MED ORDER — LIDOCAINE 2% (20 MG/ML) 5 ML SYRINGE
INTRAMUSCULAR | Status: DC | PRN
  Administered 2017-04-17: 100 mg via INTRAVENOUS

## 2017-04-17 MED ORDER — PROPOFOL 10 MG/ML IV BOLUS
INTRAVENOUS | Status: DC | PRN
  Administered 2017-04-17 (×7): 20 mg via INTRAVENOUS

## 2017-04-17 MED ORDER — PROPOFOL 10 MG/ML IV BOLUS
INTRAVENOUS | Status: AC
  Filled 2017-04-17: qty 40

## 2017-04-17 MED ORDER — SODIUM CHLORIDE 0.9 % IV SOLN: INTRAVENOUS | Status: DC

## 2017-04-17 SURGICAL SUPPLY — 25 items

## 2017-04-17 NOTE — Op Note (Signed)
[facility] Patient Name: Bethany [name] Procedure Date: 04/17/2017 MRN: [id number] Attending MD: Lear Ng , MD Date of Birth: 04/17/1951 CSN: [id number] Age: 66 Admit Type: Outpatient Procedure:                Upper GI endoscopy Indications:              Iron deficiency anemia Providers:                Lear Ng, MD, Laverta Baltimore RN, RN,                            William Dalton, Technician Referring MD:              Medicines:                Propofol per Anesthesia, Monitored Anesthesia Care Complications:            No immediate complications. Estimated Blood Loss:     Estimated blood loss was minimal. Procedure:                Pre-Anesthesia Assessment:                           - Prior to the procedure, a History and Physical                            was performed, and patient medications and                            allergies were reviewed. [name] tolerance of                            previous anesthesia was also reviewed. The risks                            and benefits of the procedure and the sedation                            options and risks were discussed with the patient.                            All questions were answered, and informed consent                            was obtained. Prior Anticoagulants: The patient has                            taken no previous anticoagulant or antiplatelet                            agents. ASA Grade Assessment: III - A patient with                            severe systemic disease. After reviewing the risks  and benefits, the patient was deemed in                            satisfactory condition to undergo the procedure.                           After obtaining informed consent, the endoscope was                            passed under direct vision. Throughout the                            procedure, the patient's blood pressure, pulse, and                          oxygen saturations were monitored continuously. The                            [id number] ([id number]) scope was introduced through the                            mouth, and advanced to the second part of duodenum.                            The upper GI endoscopy was accomplished without                            difficulty. The patient tolerated the procedure                            well. Scope In: Scope Out: Findings:      The examined esophagus was normal.      The Z-line was regular and was found 42 cm from the incisors.      Patchy mild inflammation characterized by congestion (edema) and       erythema was found in the prepyloric region of the stomach.      The cardia and gastric fundus were normal on retroflexion.      The examined duodenum was normal. Biopsies for histology were taken with       a cold forceps for evaluation of celiac disease. Estimated blood loss       was minimal. Impression:               - Normal esophagus.                           - Z-line regular, 42 cm from the incisors.                           - Gastritis.                           - Normal examined duodenum. Biopsied. Moderate Sedation:      N/A- Per Anesthesia Care Recommendation:           - Patient has a contact number available for  emergencies. The signs and symptoms of potential                            delayed complications were discussed with the                            patient. Return to normal activities tomorrow.                            Written discharge instructions were provided to the                            patient.                           - Resume previous diet.                           - Await pathology results.                           - Continue present medications. Procedure Code(s):        --- Professional ---                           [phone], Esophagogastroduodenoscopy, flexible,                             transoral; with biopsy, single or multiple Diagnosis Code(s):        --- Professional ---                           D50.9, Iron deficiency anemia, unspecified                           K29.70, Gastritis, unspecified, without bleeding CPT copyright 2016 [employer]. All rights reserved. The codes documented in this report are preliminary and upon coder review may  be revised to meet current compliance requirements. Lear Ng, MD 04/17/2017 10:29:12 AM This report has been signed electronically. Number of Addenda: 0

## 2017-04-17 NOTE — Interval H&P Note (Signed)
History and Physical Interval Note:  04/17/2017 9:42 AM  Bethany [name]  has presented today for surgery, with the diagnosis of Iron deficiency anemia  The various methods of treatment have been discussed with the patient and family. After consideration of risks, benefits and other options for treatment, the patient has consented to  Procedure(s): COLONOSCOPY WITH PROPOFOL (N/A) ESOPHAGOGASTRODUODENOSCOPY (EGD) WITH PROPOFOL (N/A) as a surgical intervention .  [name] history has been reviewed, patient examined, no change in status, stable for surgery.  I have reviewed the patient's chart and labs.  Questions were answered to the patient's satisfaction.     Benton C.

## 2017-04-17 NOTE — Anesthesia Postprocedure Evaluation (Signed)
Anesthesia Post Note  Patient: Bethany [name]  Procedure(s) Performed: COLONOSCOPY WITH PROPOFOL (N/A ) ESOPHAGOGASTRODUODENOSCOPY (EGD) WITH PROPOFOL (N/A )     Patient location during evaluation: PACU Anesthesia Type: MAC Level of consciousness: awake and alert Pain management: pain level controlled Vital Signs Assessment: post-procedure vital signs reviewed and stable Respiratory status: spontaneous breathing, nonlabored ventilation, respiratory function stable and patient connected to nasal cannula oxygen Cardiovascular status: stable and blood pressure returned to baseline Postop Assessment: no apparent nausea or vomiting Anesthetic complications: no    Last Vitals:  Vitals:   04/17/17 1040 04/17/17 1050  BP: (!) 180/67 (!) 181/112  Pulse: 80 84  Resp: 15 18  Temp:    SpO2: 100% 100%    Last Pain:  Vitals:   04/17/17 1031  TempSrc: Oral                 [name] S

## 2017-04-17 NOTE — Transfer of Care (Signed)
Immediate Anesthesia Transfer of Care Note  Patient: Bethany [name]  Procedure(s) Performed: COLONOSCOPY WITH PROPOFOL (N/A ) ESOPHAGOGASTRODUODENOSCOPY (EGD) WITH PROPOFOL (N/A )  Patient Location: PACU and Endoscopy Unit  Anesthesia Type:MAC  Level of Consciousness: awake, alert , oriented and patient cooperative  Airway & Oxygen Therapy: Patient Spontanous Breathing and Patient connected to nasal cannula oxygen  Post-op Assessment: Report given to RN, Post -op Vital signs reviewed and stable and Patient moving all extremities  Post vital signs: Reviewed and stable  Last Vitals:  Vitals:   04/17/17 0905  BP: (!) 174/66  Pulse: 93  Resp: 14  Temp: 36.7 C  SpO2: 98%    Last Pain:  Vitals:   04/17/17 0905  TempSrc: Oral         Complications: No apparent anesthesia complications

## 2017-04-17 NOTE — H&P (Signed)
Date of Initial H&P: 03/30/17  History reviewed, patient examined, no change in status, stable for surgery.

## 2017-04-17 NOTE — Anesthesia Preprocedure Evaluation (Signed)
Anesthesia Evaluation  Patient identified by MRN, date of birth, ID band Patient awake    Reviewed: Allergy & Precautions, NPO status , Patient's Chart, lab work & pertinent test results, reviewed documented beta blocker date and time   Airway Mallampati: II  TM Distance: >3 FB Neck ROM: Full    Dental  (+) Edentulous Upper, Edentulous Lower   Pulmonary COPD, Current Smoker,    breath sounds clear to auscultation       Cardiovascular hypertension, Pt. on medications and Pt. on home beta blockers  Rhythm:Regular Rate:Normal     Neuro/Psych  Headaches, PSYCHIATRIC DISORDERS Depression    GI/Hepatic Neg liver ROS, GERD  Medicated and Controlled,  Endo/Other  diabetes, Type 2, Insulin DependentHypothyroidism   Renal/GU Renal disease     Musculoskeletal  (+) Arthritis ,   Abdominal (+) + obese,   Peds  Hematology   Anesthesia Other Findings   Reproductive/Obstetrics negative OB ROS                             Lab Results  Component Value Date   WBC 9.9 04/03/2017   HGB 9.2 (L) 04/03/2017   HCT 30.6 (L) 04/03/2017   MCV 77.2 (L) 04/03/2017   PLT 476 (H) 04/03/2017   EKG: normal sinus rhythm.   Anesthesia Physical Anesthesia Plan  ASA: III  Anesthesia Plan: MAC   Post-op Pain Management:    Induction: Intravenous  PONV Risk Score and Plan: 0 and Propofol infusion  Airway Management Planned: Natural Airway and Nasal Cannula  Additional Equipment:   Intra-op Plan:   Post-operative Plan:   Informed Consent: I have reviewed the patients History and Physical, chart, labs and discussed the procedure including the risks, benefits and alternatives for the proposed anesthesia with the patient or authorized representative who has indicated his/her understanding and acceptance.     Plan Discussed with: CRNA  Anesthesia Plan Comments:         Anesthesia Quick Evaluation

## 2017-04-17 NOTE — Discharge Instructions (Signed)

## 2017-04-17 NOTE — Op Note (Signed)
[facility] Patient Name: Bethany [name] Procedure Date: 04/17/2017 MRN: [id number] Attending MD: Lear Ng , MD Date of Birth: [birth year] CSN: [id number] Age: 66 Admit Type: Outpatient Procedure:                Colonoscopy Indications:              Last colonoscopy: 2016, Iron deficiency anemia Providers:                Lear Ng, MD, Laverta Baltimore RN, RN,                            William Dalton, Technician Referring MD:              Medicines:                Propofol per Anesthesia, Monitored Anesthesia Care Complications:            No immediate complications. Estimated Blood Loss:     Estimated blood loss: none. Procedure:                Pre-Anesthesia Assessment:                           - Prior to the procedure, a History and Physical                            was performed, and patient medications and                            allergies were reviewed. [name] tolerance of                            previous anesthesia was also reviewed. The risks                            and benefits of the procedure and the sedation                            options and risks were discussed with the patient.                            All questions were answered, and informed consent                            was obtained. Prior Anticoagulants: The patient has                            taken no previous anticoagulant or antiplatelet                            agents. ASA Grade Assessment: III - A patient with                            severe systemic disease. After reviewing the risks  and benefits, the patient was deemed in                            satisfactory condition to undergo the procedure.                           - Prior to the procedure, a History and Physical                            was performed, and patient medications and                            allergies were reviewed. [name] tolerance  of                            previous anesthesia was also reviewed. The risks                            and benefits of the procedure and the sedation                            options and risks were discussed with the patient.                            All questions were answered, and informed consent                            was obtained. Prior Anticoagulants: The patient has                            taken no previous anticoagulant or antiplatelet                            agents. ASA Grade Assessment: III - A patient with                            severe systemic disease. After reviewing the risks                            and benefits, the patient was deemed in                            satisfactory condition to undergo the procedure.                           After obtaining informed consent, the colonoscope                            was passed under direct vision. Throughout the                            procedure, the patient's blood pressure, pulse, and  oxygen saturations were monitored continuously. The                            [id number] ([id number]) scope was introduced through                            the anus and advanced to the the cecum, identified                            by appendiceal orifice and ileocecal valve. The                            colonoscopy was somewhat difficult due to a                            tortuous colon. Successful completion of the                            procedure was aided by straightening and shortening                            the scope to obtain bowel loop reduction. The                            patient tolerated the procedure well. The quality                            of the bowel preparation was adequate and good. The                            terminal ileum, ileocecal valve, appendiceal                            orifice, and rectum were photographed. Scope In: 10:07:26 AM Scope Out:  10:23:05 AM Scope Withdrawal Time: 0 hours 8 minutes 35 seconds  Total Procedure Duration: 0 hours 15 minutes 39 seconds  Findings:      The perianal and digital rectal examinations were normal.      A 6 mm polyp was found in the cecum. The polyp was sessile. The polyp       was removed with a hot snare. Resection and retrieval were complete.       Estimated blood loss: none.      Multiple small and large-mouthed diverticula were found in the entire       colon.      Internal hemorrhoids were found during retroflexion. The hemorrhoids       were medium-sized and Grade I (internal hemorrhoids that do not       prolapse).      A nonbleeding telangiectasia was found in the rectum. Impression:               - One 6 mm polyp in the cecum, removed with a hot                            snare. Resected and retrieved.                           -  Diverticulosis in the entire examined colon.                           - Internal hemorrhoids.                           - Telangiectasia mucosa in the rectum. Moderate Sedation:      N/A- Per Anesthesia Care Recommendation:           - Patient has a contact number available for                            emergencies. The signs and symptoms of potential                            delayed complications were discussed with the                            patient. Return to normal activities tomorrow.                            Written discharge instructions were provided to the                            patient.                           - High fiber diet.                           - Await pathology results.                           - Repeat colonoscopy for surveillance based on                            pathology results.                           - No aspirin, ibuprofen, naproxen, or other                            non-steroidal anti-inflammatory drugs for 1 week. Procedure Code(s):        --- Professional ---                           [phone],  Colonoscopy, flexible; with removal of                            tumor(s), polyp(s), or other lesion(s) by snare                            technique Diagnosis Code(s):        --- Professional ---                           D50.9, Iron deficiency anemia, unspecified  D12.0, Benign neoplasm of cecum                           K64.0, First degree hemorrhoids                           K57.30, Diverticulosis of large intestine without                            perforation or abscess without bleeding CPT copyright 2016 American Medical Association. All rights reserved. The codes documented in this report are preliminary and upon coder review may  be revised to meet current compliance requirements. Lear Ng, MD 04/17/2017 10:39:10 AM This report has been signed electronically. Number of Addenda: 0

## 2017-04-18 ENCOUNTER — Encounter (HOSPITAL_COMMUNITY): Payer: Self-pay | Admitting: Gastroenterology

## 2017-04-19 ENCOUNTER — Ambulatory Visit (HOSPITAL_BASED_OUTPATIENT_CLINIC_OR_DEPARTMENT_OTHER): Payer: Medicare Other | Admitting: Hematology and Oncology

## 2017-04-19 ENCOUNTER — Encounter: Payer: Self-pay | Admitting: Hematology and Oncology

## 2017-04-19 ENCOUNTER — Other Ambulatory Visit (HOSPITAL_BASED_OUTPATIENT_CLINIC_OR_DEPARTMENT_OTHER): Payer: Medicare Other

## 2017-04-19 VITALS — BP 142/56 | HR 71 | Temp 98.2°F | Resp 18 | Ht 65.0 in | Wt 191.4 lb

## 2017-04-19 DIAGNOSIS — Q2733 Arteriovenous malformation of digestive system vessel: Secondary | ICD-10-CM | POA: Diagnosis not present

## 2017-04-19 DIAGNOSIS — K5520 Angiodysplasia of colon without hemorrhage: Secondary | ICD-10-CM

## 2017-04-19 DIAGNOSIS — D500 Iron deficiency anemia secondary to blood loss (chronic): Secondary | ICD-10-CM

## 2017-04-19 LAB — CBC WITH DIFFERENTIAL/PLATELET
BASO%: 1 % (ref 0.0–2.0)
Basophils Absolute: 0.1 10*3/uL (ref 0.0–0.1)
EOS%: 4.5 % (ref 0.0–7.0)
Eosinophils Absolute: 0.3 10*3/uL (ref 0.0–0.5)
HCT: 34 % — ABNORMAL LOW (ref 34.8–46.6)
HGB: 10.1 g/dL — ABNORMAL LOW (ref 11.6–15.9)
LYMPH%: 15 % (ref 14.0–49.7)
MCH: 21.9 pg — ABNORMAL LOW (ref 25.1–34.0)
MCHC: 29.6 g/dL — ABNORMAL LOW (ref 31.5–36.0)
MCV: 73.9 fL — ABNORMAL LOW (ref 79.5–101.0)
MONO#: 0.4 10*3/uL (ref 0.1–0.9)
MONO%: 5.7 % (ref 0.0–14.0)
NEUT#: 5.2 10*3/uL (ref 1.5–6.5)
NEUT%: 73.8 % (ref 38.4–76.8)
Platelets: 288 10*3/uL (ref 145–400)
RBC: 4.6 10*6/uL (ref 3.70–5.45)
RDW: 24.3 % — ABNORMAL HIGH (ref 11.2–14.5)
WBC: 7 10*3/uL (ref 3.9–10.3)
lymph#: 1 10*3/uL (ref 0.9–3.3)

## 2017-04-22 NOTE — Assessment & Plan Note (Signed)
66 y.o. female with intestinal AVM history with associated chronic GI blood loss resulting in iron deficiency and blood loss anemia. Patient has been managed with parenteral iron supplementation blood transfusions as the AVMs were to locate. History has been complicated by arterial thrombosis event for which patient does not currently receiving therapeutic anticoagulation due to history of bleeding. Shortly after our initial visit in the clinic, patient was admitted to the hospital with exacerbation of her anemia likely due to concurrent gastrointestinal bleeding and possible community-acquired pneumonia.  Patient was treated at the hospital and recovered after 2 units of packed red blood cells transfusion.    Recurrent bleeding since last visit to the clinic.  Hemoglobin remains reasonably stable.  Iron level improved likely due to transfusions that the patient has received during her last hospitalization.  Plan: --Based on the previous value, patient needs Venofer injection. --Blood support plan:  --QWk labs for now  --If Hgb <= 8.0 (based on patient becoming symptomatic from her anemia) -- transfuse 2 units pRBC and re-check Hgb. If not rising appropriately, admit to the hospital for additional transfusions  --If Hgb 8.1-9.0 -- transfuse 1 unit of pRBC and re-check Hgb  --If Hgb =>9.1 -- schedule monthly Venofer infusions to keep Ferritin >100 (CKD, chronic inflammatory state likely elevate the baseline). --RTC in 2 weeks with labs & possible transfusion

## 2017-04-22 NOTE — Progress Notes (Signed)
[facility] Follow-up Visit:  Assessment: No problem-specific Assessment & Plan notes found for this encounter. Voice recognition software was used and creation of this note. Despite my best effort at editing the text, some misspelling/errors may have occurred.  Orders Placed This Encounter  Procedures  . CBC with Differential    Standing Status:   Future    Number of Occurrences:   1    Standing Expiration Date:   04/04/2018  . Hold Tube, Blood Bank    Standing Status:   Future    Number of Occurrences:   1    Standing Expiration Date:   04/04/2018   All questions were answered.  . The patient knows to call the clinic with any problems, questions or concerns.  This note was electronically signed.    History of Presenting Illness Bethany [name] is a 66 y.o. female followed in the [facility] for assistance in management of chronic anemia due to running blood loss via AVMs in the GI tract. 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 Patient is not on therapeutic anticoagulation due to recurrent gastrointestinal bleeding. Additional past medical history includes insulin-dependent diabetes mellitus, hypertension, GERD, hypothyroidism, coronary artery disease. Patient also was diagnosed with protein C deficiency in the context of arterial clots.  At the present time, patient continues to smoke 0.5 packs per day which she has done over the past 40 years. She does not consume alcohol. Patient reports low energy, generalized slowing of the activities of daily living, easy fatigability. Also reports cramping in the lower extremities with ambulation. Denies fevers, chills, night sweats. No active chest pain or shortness of breath at rest. No significant abdominal pain, diarrhea, or constipation. No recurrent melena. Patient has been receiving support with parenteral iron supplementation and blood transfusions for management of her  chronic anemia.  Returns to the clinic for continued monitoring of her anemia.  Patient has had no new episodes of symptomatic anemia since last visit to the clinic.  No recurrent GI bleeding.  Patient is scheduled for colonoscopy on 04/17/17.  She does acknowledge having some alcohol over the past weekend.  Denies any other active symptoms.  Oncological/hematological History: --Labs, 03/01/17: Hgb 8.8, MCV 73.3, MCH 22.0, RDW 18.4, Plt 318; tProt 7.1, Alb 4.2, Ca 10.4, Cr 1.2, AP 125; TSH 4.74 --Labs, 03/07/17: Hgb 8.2, MCV 74.4, MCH 21.0, RWD 17.4, Plt 254; Fe 18, FeSat   4%, TIBC 453, Ferritin 15, Folate 9.3, Vit B12 783 --Admission, 10/29-11/02/18: Discharge diagnoses  --community-acquired pneumonia, COPD exacerbation, anemia due to acute blood loss and chronic anemia due to chronic gastrointestinal bleeding.    --Labs:     Hgb 6.5, MCV 71.6, MCH 20.3, RDW 17.8, Plt 279;    --Treatment: 2U pRBC, prednisone   --Labs, 04/03/17: Hgb 9.2, MCV 77.2, MCH 23.1, RDW 26.1, Plt 476; Fe 39, FeSat 11%, TIBC 349, Ferritin 79; AP 210, AST 44, ALT 87   Medical History: Past Medical History:  Diagnosis Date  . Acute renal failure ([place])   . Arthritis   . Diabetes mellitus without complication ([place])    type II   . Hypercalcemia   . Hypertension   . Renal disorder   . Single kidney   . Thrombosis     Surgical History: Past Surgical History:  Procedure Laterality Date  . ABDOMINAL HYSTERECTOMY    . blood clots removed from descending aorta     . CHOLECYSTECTOMY    .  COLONOSCOPY    . COLONOSCOPY WITH PROPOFOL N/A 04/17/2017   Procedure: COLONOSCOPY WITH PROPOFOL;  Surgeon: Wilford Corner, MD;  Location: [facility];  Service: Endoscopy;  Laterality: N/A;  . ESOPHAGOGASTRODUODENOSCOPY (EGD) WITH PROPOFOL N/A 04/17/2017   Procedure: ESOPHAGOGASTRODUODENOSCOPY (EGD) WITH PROPOFOL;  Surgeon: Wilford Corner, MD;  Location: [facility];  Service: Endoscopy;  Laterality: N/A;  . NEPHRECTOMY  RECIPIENT      Family History: Family History  Problem Relation Age of Onset  . Anemia Mother   . Diabetes Father   . Hypertension Brother     Social History: Social History   Socioeconomic History  . Marital status: Single    Spouse name: Not on file  . Number of children: Not on file  . Years of education: Not on file  . Highest education level: Not on file  Social Needs  . Financial resource strain: Not on file  . Food insecurity - worry: Not on file  . Food insecurity - inability: Not on file  . Transportation needs - medical: Not on file  . Transportation needs - non-medical: Not on file  Occupational History  . Occupation: retired   Tobacco Use  . Smoking status: Current Every Day Smoker    Types: Cigarettes  . Smokeless tobacco: Never Used  Substance and Sexual Activity  . Alcohol use: No  . Drug use: No  . Sexual activity: Not on file  Other Topics Concern  . Not on file  Social History Narrative   Lives with daughter    Dorie Rank from [place]     Allergies: Allergies  Allergen Reactions  . Contrast Media [Iodinated Diagnostic Agents]     Renal hypertension per physician    Medications:  Current Outpatient Medications  Medication Sig Dispense Refill  . amLODipine (NORVASC) 5 MG tablet Take 1 tablet (5 mg total) by mouth daily. 30 tablet 0  . cholecalciferol (VITAMIN D) 1000 units tablet Take 1 tablet (1,000 Units total) by mouth once a week.    . hydrALAZINE (APRESOLINE) 25 MG tablet Take 25 mg by mouth 3 (three) times daily.    Marland Kitchen HYDROcodone-acetaminophen (NORCO) 10-325 MG tablet Take 1 tablet by mouth every 6 (six) hours as needed for moderate pain.    . Insulin Glargine (TOUJEO SOLOSTAR) 300 UNIT/ML SOPN Inject 54 Units into the skin at bedtime.     . insulin lispro (HUMALOG KWIKPEN) 100 UNIT/ML KiwkPen Inject 22-24 Units into the skin See admin instructions. Inject 24 units with breakfast, 22 units with lunch, and 24 units with dinner    .  Ipratropium-Albuterol (COMBIVENT) 20-100 MCG/ACT AERS respimat Inhale 1 puff into the lungs every 6 (six) hours as needed for wheezing or shortness of breath. 1 Inhaler 0  . levothyroxine (SYNTHROID, LEVOTHROID) 25 MCG tablet Take 12.5 mcg by mouth daily before breakfast.    . metoprolol (LOPRESSOR) 50 MG tablet Take 50 mg by mouth 2 (two) times daily.     . Multiple Vitamins-Minerals (EQ MULTIVITAMINS ADULT GUMMY) CHEW Chew 1 tablet by mouth daily.    . nicotine (NICODERM CQ - DOSED IN MG/24 HOURS) 21 mg/24hr patch Place 1 patch (21 mg total) onto the skin daily. 28 patch 0  . pantoprazole (PROTONIX) 40 MG tablet Take 40 mg by mouth daily.    . polyethylene glycol (MIRALAX / GLYCOLAX) packet Take 17 g by mouth daily. (Patient not taking: Reported on 04/19/2017) 14 each 0  . tiZANidine (ZANAFLEX) 2 MG tablet Take 2 mg by mouth at  bedtime as needed for muscle spasms.     . B Complex Vitamins (B COMPLEX PO) Take 1 capsule by mouth daily.    Marland Kitchen gabapentin (NEURONTIN) 600 MG tablet Take 600 mg by mouth 3 (three) times daily.     Marland Kitchen PRALUENT 75 MG/ML SOPN Inject 75 mg as directed every 14 (fourteen) days.  4   No current facility-administered medications for this visit.     Review of Systems: Review of Systems  Constitutional: Positive for fatigue.  Musculoskeletal: Positive for myalgias.  All other systems reviewed and are negative.    PHYSICAL EXAMINATION Blood pressure (!) 143/52, pulse 66, temperature 99.1 F (37.3 C), temperature source Oral, resp. rate 18, height '5\' 5"'  (1.651 m), weight 189 lb 1.6 oz (85.8 kg), SpO2 98 %.  ECOG PERFORMANCE STATUS: 2 - Symptomatic, <50% confined to bed  Physical Exam  Constitutional: She is oriented to person, place, and time and well-developed, well-nourished, and in no distress. No distress.  HENT:  Head: Normocephalic and atraumatic.  Mouth/Throat: Oropharynx is clear and moist. No oropharyngeal exudate.  Eyes: Conjunctivae and EOM are normal.  Pupils are equal, round, and reactive to light. No scleral icterus.  Neck: No thyromegaly present.  Cardiovascular: Normal rate, regular rhythm, normal heart sounds and intact distal pulses. Exam reveals no gallop.  No murmur heard. Pulmonary/Chest: Effort normal and breath sounds normal. No respiratory distress. She has no wheezes. She has no rales. She exhibits no tenderness.  Abdominal: Soft. Bowel sounds are normal. She exhibits no distension and no mass. There is no tenderness. There is no rebound and no guarding.  Musculoskeletal: Normal range of motion. She exhibits no edema.  Lymphadenopathy:    She has no cervical adenopathy.  Neurological: She is alert and oriented to person, place, and time. She has normal reflexes. No cranial nerve deficit. Coordination normal.  Skin: No rash noted. She is not diaphoretic. No erythema. There is pallor.     LABORATORY DATA: I have personally reviewed the data as listed: Appointment on 04/03/2017  Component Date Value Ref Range Status  . WBC 04/03/2017 9.9  3.9 - 10.3 10e3/uL Final  . NEUT# 04/03/2017 8.1* 1.5 - 6.5 10e3/uL Final  . HGB 04/03/2017 9.2* 11.6 - 15.9 g/dL Final  . HCT 04/03/2017 30.6* 34.8 - 46.6 % Final  . Platelets 04/03/2017 476* 145 - 400 10e3/uL Final  . MCV 04/03/2017 77.2* 79.5 - 101.0 fL Final  . MCH 04/03/2017 23.1* 25.1 - 34.0 pg Final  . MCHC 04/03/2017 30.0* 31.5 - 36.0 g/dL Final  . RBC 04/03/2017 3.96  3.70 - 5.45 10e6/uL Final  . RDW 04/03/2017 26.1* 11.2 - 14.5 % Final  . lymph# 04/03/2017 0.8* 0.9 - 3.3 10e3/uL Final  . MONO# 04/03/2017 0.6  0.1 - 0.9 10e3/uL Final  . Eosinophils Absolute 04/03/2017 0.3  0.0 - 0.5 10e3/uL Final  . Basophils Absolute 04/03/2017 0.1  0.0 - 0.1 10e3/uL Final  . NEUT% 04/03/2017 82.0* 38.4 - 76.8 % Final  . LYMPH% 04/03/2017 8.0* 14.0 - 49.7 % Final  . MONO% 04/03/2017 6.0  0.0 - 14.0 % Final  . EOS% 04/03/2017 3.1  0.0 - 7.0 % Final  . BASO% 04/03/2017 0.9  0.0 - 2.0 % Final   . Sodium 04/03/2017 137  136 - 145 mEq/L Final  . Potassium 04/03/2017 4.8  3.5 - 5.1 mEq/L Final  . Chloride 04/03/2017 103  98 - 109 mEq/L Final  . CO2 04/03/2017 23  22 - 29  mEq/L Final  . Glucose 04/03/2017 254* 70 - 140 mg/dl Final   Glucose reference range is for nonfasting patients. Fasting glucose reference range is 70- 100.  Marland Kitchen BUN 04/03/2017 19.8  7.0 - 26.0 mg/dL Final  . Creatinine 04/03/2017 1.5* 0.6 - 1.1 mg/dL Final  . Total Bilirubin 04/03/2017 0.23  0.20 - 1.20 mg/dL Final  . Alkaline Phosphatase 04/03/2017 210* 40 - 150 U/L Final  . AST 04/03/2017 44* 5 - 34 U/L Final  . ALT 04/03/2017 87* 0 - 55 U/L Final  . Total Protein 04/03/2017 7.1  6.4 - 8.3 g/dL Final  . Albumin 04/03/2017 3.5  3.5 - 5.0 g/dL Final  . Calcium 04/03/2017 10.1  8.4 - 10.4 mg/dL Final  . Anion Gap 04/03/2017 11  3 - 11 mEq/L Final  . EGFR 04/03/2017 42* >60 ml/min/1.73 m2 Final   eGFR is calculated using the CKD-EPI Creatinine Equation (2009)  . Ferritin 04/03/2017 79  9 - 269 ng/ml Final  . Iron 04/03/2017 39* 41 - 142 ug/dL Final  . TIBC 04/03/2017 349  236 - 444 ug/dL Final  . UIBC 04/03/2017 309  120 - 384 ug/dL Final  . %SAT 04/03/2017 11* 21 - 57 % Final       Ardath Sax, MD

## 2017-04-26 ENCOUNTER — Ambulatory Visit (HOSPITAL_BASED_OUTPATIENT_CLINIC_OR_DEPARTMENT_OTHER): Payer: Medicare Other

## 2017-04-26 VITALS — BP 159/58 | HR 68 | Temp 98.4°F | Resp 18

## 2017-04-26 DIAGNOSIS — D500 Iron deficiency anemia secondary to blood loss (chronic): Secondary | ICD-10-CM | POA: Diagnosis not present

## 2017-04-26 DIAGNOSIS — Q2733 Arteriovenous malformation of digestive system vessel: Secondary | ICD-10-CM | POA: Diagnosis present

## 2017-04-26 DIAGNOSIS — K5520 Angiodysplasia of colon without hemorrhage: Secondary | ICD-10-CM

## 2017-04-26 MED ORDER — SODIUM CHLORIDE 0.9 % IV SOLN
Freq: Once | INTRAVENOUS | Status: AC
Start: 2017-04-26 — End: 2017-04-26
  Administered 2017-04-26: 11:00:00 via INTRAVENOUS

## 2017-04-26 MED ORDER — SODIUM CHLORIDE 0.9 % IV SOLN
200.0000 mg | Freq: Once | INTRAVENOUS | Status: AC
  Administered 2017-04-26: 200 mg via INTRAVENOUS
  Filled 2017-04-26: qty 10

## 2017-04-26 NOTE — Patient Instructions (Signed)
Iron Sucrose injection What is this medicine? IRON SUCROSE (AHY ern SOO krohs) is an iron complex. Iron is used to make healthy red blood cells, which carry oxygen and nutrients throughout the body. This medicine is used to treat iron deficiency anemia in people with chronic kidney disease. This medicine may be used for other purposes; ask your health care provider or pharmacist if you have questions. COMMON BRAND NAME(S): Venofer What should I tell my health care provider before I take this medicine? They need to know if you have any of these conditions: -anemia not caused by low iron levels -heart disease -high levels of iron in the blood -kidney disease -liver disease -an unusual or allergic reaction to iron, other medicines, foods, dyes, or preservatives -pregnant or trying to get pregnant -breast-feeding How should I use this medicine? This medicine is for infusion into a vein. It is given by a health care professional in a hospital or clinic setting. Talk to your pediatrician regarding the use of this medicine in children. While this drug may be prescribed for children as young as 2 years for selected conditions, precautions do apply. Overdosage: If you think you have taken too much of this medicine contact a poison control center or emergency room at once. NOTE: This medicine is only for you. Do not share this medicine with others. What if I miss a dose? It is important not to miss your dose. Call your doctor or health care professional if you are unable to keep an appointment. What may interact with this medicine? Do not take this medicine with any of the following medications: -deferoxamine -dimercaprol -other iron products This medicine may also interact with the following medications: -chloramphenicol -deferasirox This list may not describe all possible interactions. Give your health care provider a list of all the medicines, herbs, non-prescription drugs, or dietary  supplements you use. Also tell them if you smoke, drink alcohol, or use illegal drugs. Some items may interact with your medicine. What should I watch for while using this medicine? Visit your doctor or healthcare professional regularly. Tell your doctor or healthcare professional if your symptoms do not start to get better or if they get worse. You may need blood work done while you are taking this medicine. You may need to follow a special diet. Talk to your doctor. Foods that contain iron include: whole grains/cereals, dried fruits, beans, or peas, leafy green vegetables, and organ meats (liver, kidney). What side effects may I notice from receiving this medicine? Side effects that you should report to your doctor or health care professional as soon as possible: -allergic reactions like skin rash, itching or hives, swelling of the face, lips, or tongue -breathing problems -changes in blood pressure -cough -fast, irregular heartbeat -feeling faint or lightheaded, falls -fever or chills -flushing, sweating, or hot feelings -joint or muscle aches/pains -seizures -swelling of the ankles or feet -unusually weak or tired Side effects that usually do not require medical attention (report to your doctor or health care professional if they continue or are bothersome): -diarrhea -feeling achy -headache -irritation at site where injected -nausea, vomiting -stomach upset -tiredness This list may not describe all possible side effects. Call your doctor for medical advice about side effects. You may report side effects to FDA at 1-800-FDA-1088. Where should I keep my medicine? This drug is given in a hospital or clinic and will not be stored at home. NOTE: This sheet is a summary. It may not cover all possible information. If   you have questions about this medicine, talk to your doctor, pharmacist, or health care provider.  2018 Elsevier/Gold Standard (2011-02-10 17:14:35)  

## 2017-05-01 ENCOUNTER — Ambulatory Visit (HOSPITAL_COMMUNITY)
Admission: RE | Admit: 2017-05-01 | Discharge: 2017-05-01 | Disposition: A | Discharge status: Home / Self Care | Payer: Medicare Other | Source: Ambulatory Visit | Attending: Surgery | Admitting: Surgery

## 2017-05-01 ENCOUNTER — Ambulatory Visit
Admission: RE | Admit: 2017-05-01 | Discharge: 2017-05-01 | Disposition: A | Discharge status: Home / Self Care | Payer: Medicare Other | Source: Ambulatory Visit | Attending: Vascular Surgery | Admitting: Vascular Surgery

## 2017-05-01 DIAGNOSIS — I712 Thoracic aortic aneurysm, without rupture: Secondary | ICD-10-CM | POA: Diagnosis not present

## 2017-05-01 DIAGNOSIS — I714 Abdominal aortic aneurysm, without rupture: Secondary | ICD-10-CM | POA: Diagnosis not present

## 2017-05-01 DIAGNOSIS — I739 Peripheral vascular disease, unspecified: Secondary | ICD-10-CM | POA: Diagnosis not present

## 2017-05-01 DIAGNOSIS — I700 Atherosclerosis of aorta: Secondary | ICD-10-CM

## 2017-05-01 MED ORDER — IOPAMIDOL (ISOVUE-370) INJECTION 76%
75.0000 mL | Freq: Once | INTRAVENOUS | Status: AC | PRN
  Administered 2017-05-01: 75 mL via INTRAVENOUS

## 2017-05-03 ENCOUNTER — Other Ambulatory Visit (HOSPITAL_BASED_OUTPATIENT_CLINIC_OR_DEPARTMENT_OTHER): Payer: Medicare Other

## 2017-05-03 ENCOUNTER — Ambulatory Visit: Payer: Medicare Other

## 2017-05-03 ENCOUNTER — Encounter: Payer: Self-pay | Admitting: Vascular Surgery

## 2017-05-03 ENCOUNTER — Other Ambulatory Visit: Payer: Self-pay

## 2017-05-03 ENCOUNTER — Ambulatory Visit (INDEPENDENT_AMBULATORY_CARE_PROVIDER_SITE_OTHER): Payer: Medicare Other | Admitting: Vascular Surgery

## 2017-05-03 VITALS — BP 146/68 | HR 63 | Temp 98.9°F | Resp 16 | Ht 65.0 in | Wt 189.0 lb

## 2017-05-03 DIAGNOSIS — I739 Peripheral vascular disease, unspecified: Secondary | ICD-10-CM | POA: Diagnosis not present

## 2017-05-03 DIAGNOSIS — Q2733 Arteriovenous malformation of digestive system vessel: Secondary | ICD-10-CM

## 2017-05-03 DIAGNOSIS — D500 Iron deficiency anemia secondary to blood loss (chronic): Secondary | ICD-10-CM

## 2017-05-03 DIAGNOSIS — K5520 Angiodysplasia of colon without hemorrhage: Secondary | ICD-10-CM

## 2017-05-03 LAB — CBC WITH DIFFERENTIAL/PLATELET
BASO%: 0.7 % (ref 0.0–2.0)
Basophils Absolute: 0.1 10*3/uL (ref 0.0–0.1)
EOS%: 2.2 % (ref 0.0–7.0)
Eosinophils Absolute: 0.2 10*3/uL (ref 0.0–0.5)
HCT: 32.6 % — ABNORMAL LOW (ref 34.8–46.6)
HGB: 9.8 g/dL — ABNORMAL LOW (ref 11.6–15.9)
LYMPH%: 11.6 % — ABNORMAL LOW (ref 14.0–49.7)
MCH: 21.9 pg — ABNORMAL LOW (ref 25.1–34.0)
MCHC: 30 g/dL — ABNORMAL LOW (ref 31.5–36.0)
MCV: 73.1 fL — ABNORMAL LOW (ref 79.5–101.0)
MONO#: 0.5 10*3/uL (ref 0.1–0.9)
MONO%: 5.2 % (ref 0.0–14.0)
NEUT#: 8 10*3/uL — ABNORMAL HIGH (ref 1.5–6.5)
NEUT%: 80.3 % — ABNORMAL HIGH (ref 38.4–76.8)
Platelets: 431 10*3/uL — ABNORMAL HIGH (ref 145–400)
RBC: 4.47 10*6/uL (ref 3.70–5.45)
RDW: 25.5 % — ABNORMAL HIGH (ref 11.2–14.5)
WBC: 10 10*3/uL (ref 3.9–10.3)
lymph#: 1.2 10*3/uL (ref 0.9–3.3)

## 2017-05-03 NOTE — Progress Notes (Signed)
Patient name: Bethany [name] MRN: [id number] DOB: [birth year] Sex: female  REASON FOR VISIT:   Follow-up after CT angiogram.  HPI:   Bethany [name] is a pleasant 66 y.o. female who I saw in consultation on 03/30/2017 to establish local care.  The patient had moved from [place].  He has a complicated history in that she had a clot in her descending thoracic aorta in 2013 which embolized to her left kidney and spleen.  She required a left nephrectomy.  In order to establish a baseline, I recommended that we proceed with a CT scan of the chest abdomen and pelvis to evaluate her atherosclerosis of the descending thoracic aorta and also the infrarenal aorta.  This would then help [place] determine the best interval for follow-up.  She comes in to discuss the results of her CT angiogram.  She has no new complaints.  Current Outpatient Medications  Medication Sig Dispense Refill  . amLODipine (NORVASC) 5 MG tablet Take 1 tablet (5 mg total) by mouth daily. 30 tablet 0  . B Complex Vitamins (B COMPLEX PO) Take 1 capsule by mouth daily.    . cholecalciferol (VITAMIN D) 1000 units tablet Take 1 tablet (1,000 Units total) by mouth once a week.    . gabapentin (NEURONTIN) 600 MG tablet Take 600 mg by mouth 3 (three) times daily.     . hydrALAZINE (APRESOLINE) 25 MG tablet Take 25 mg by mouth 3 (three) times daily.    Marland Kitchen HYDROcodone-acetaminophen (NORCO) 10-325 MG tablet Take 1 tablet by mouth every 6 (six) hours as needed for moderate pain.    . Insulin Glargine (TOUJEO SOLOSTAR) 300 UNIT/ML SOPN Inject 54 Units into the skin at bedtime.     . insulin lispro (HUMALOG KWIKPEN) 100 UNIT/ML KiwkPen Inject 22-24 Units into the skin See admin instructions. Inject 24 units with breakfast, 22 units with lunch, and 24 units with dinner    . Ipratropium-Albuterol (COMBIVENT) 20-100 MCG/ACT AERS respimat Inhale 1 puff into the lungs every 6 (six) hours as needed for wheezing or shortness of breath. 1 Inhaler 0  .  levothyroxine (SYNTHROID, LEVOTHROID) 25 MCG tablet Take 12.5 mcg by mouth daily before breakfast.    . metoprolol (LOPRESSOR) 50 MG tablet Take 50 mg by mouth 2 (two) times daily.     . Multiple Vitamins-Minerals (EQ MULTIVITAMINS ADULT GUMMY) CHEW Chew 1 tablet by mouth daily.    . nicotine (NICODERM CQ - DOSED IN MG/24 HOURS) 21 mg/24hr patch Place 1 patch (21 mg total) onto the skin daily. 28 patch 0  . pantoprazole (PROTONIX) 40 MG tablet Take 40 mg by mouth daily.    . polyethylene glycol (MIRALAX / GLYCOLAX) packet Take 17 g by mouth daily. 14 each 0  . PRALUENT 75 MG/ML SOPN Inject 75 mg as directed every 14 (fourteen) days.  4  . tiZANidine (ZANAFLEX) 2 MG tablet Take 2 mg by mouth at bedtime as needed for muscle spasms.      No current facility-administered medications for this visit.     REVIEW OF SYSTEMS:  [X]  denotes positive finding, [ ]  denotes negative finding Cardiac  Comments:  Chest pain or chest pressure:    Shortness of breath upon exertion:    Short of breath when lying flat:    Irregular heart rhythm:    Constitutional    Fever or chills:     PHYSICAL EXAM:   Vitals:   05/03/17 1003  BP: (!) 146/68  Pulse: 63  Resp: 16  Temp: 98.9 F (37.2 C)  TempSrc: Oral  SpO2: 99%  Weight: 189 lb (85.7 kg)  Height: 5\' 5"  (1.651 m)    GENERAL: The patient is a well-nourished female, in no acute distress. The vital signs are documented above. CARDIOVASCULAR: There is a regular rate and rhythm. PULMONARY: There is good air exchange bilaterally without wheezing or rales. VASCULAR: The patient has diminished but palpable pedal pulses on the right.  I cannot palpate pedal pulses on the left.  DATA:   CT ANGIOGRAM CHEST ABDOMEN PELVIS: The patient does have atherosclerosis of the thoracic and abdominal aorta but no evidence of aneurysm or dissection.   An incidental finding was a 2.8 cm solid density in the posterior right breast.  There was also a 15 mm nodular right  breast nodule.  In addition there was a 13 mm enhancing abnormality seen in the right hepatic lobe with a 3.3 cm enhancing abnormality in the left hepatic lobe.  MRI was recommended to rule out neoplasm.  MEDICAL ISSUES:   PERIPHERAL VASCULAR DISEASE: Her CT angiogram is fairly unremarkable from a vascular standpoint.  I think we can continue her vascular follow-up with yearly ABIs.  I have scheduled this in 1 year and we will see her back at that time.  She knows to call sooner if she has problems.  NEW RIGHT BREAST MASS AND LIVER MASSES: She was incidentally found to have a right breast mass and 2 liver masses.  She is scheduled to see her oncologist today who was following her anemia.  I will defer further workup Dr. Lebron Conners.   Bethany [name] [facility] [phone]

## 2017-05-03 NOTE — Progress Notes (Signed)
Pt's Hgb 9.8--per Dr. Clydene Laming note pt does not need to be transfused. Explained to pt who verbalized understanding and agreement. Pt D/C'd without issue

## 2017-05-17 ENCOUNTER — Other Ambulatory Visit (HOSPITAL_BASED_OUTPATIENT_CLINIC_OR_DEPARTMENT_OTHER): Payer: Medicare Other

## 2017-05-17 ENCOUNTER — Ambulatory Visit (HOSPITAL_COMMUNITY)
Admission: RE | Admit: 2017-05-17 | Discharge: 2017-05-17 | Disposition: A | Discharge status: Home / Self Care | Payer: Medicare Other | Source: Ambulatory Visit | Attending: Hematology and Oncology | Admitting: Hematology and Oncology

## 2017-05-17 ENCOUNTER — Ambulatory Visit (HOSPITAL_BASED_OUTPATIENT_CLINIC_OR_DEPARTMENT_OTHER): Payer: Self-pay

## 2017-05-17 DIAGNOSIS — D500 Iron deficiency anemia secondary to blood loss (chronic): Secondary | ICD-10-CM

## 2017-05-17 DIAGNOSIS — K5520 Angiodysplasia of colon without hemorrhage: Secondary | ICD-10-CM

## 2017-05-17 DIAGNOSIS — Q2733 Arteriovenous malformation of digestive system vessel: Secondary | ICD-10-CM

## 2017-05-17 LAB — IRON AND TIBC
%SAT: 8 % — ABNORMAL LOW (ref 21–57)
Iron: 31 ug/dL — ABNORMAL LOW (ref 41–142)
TIBC: 389 ug/dL (ref 236–444)
UIBC: 358 ug/dL (ref 120–384)

## 2017-05-17 LAB — COMPREHENSIVE METABOLIC PANEL
ALT: 16 U/L (ref 0–55)
AST: 15 U/L (ref 5–34)
Albumin: 3.6 g/dL (ref 3.5–5.0)
Alkaline Phosphatase: 121 U/L (ref 40–150)
Anion Gap: 10 mEq/L (ref 3–11)
BUN: 18.9 mg/dL (ref 7.0–26.0)
CO2: 24 mEq/L (ref 22–29)
Calcium: 10 mg/dL (ref 8.4–10.4)
Chloride: 105 mEq/L (ref 98–109)
Creatinine: 1.2 mg/dL — ABNORMAL HIGH (ref 0.6–1.1)
EGFR: 54 mL/min/{1.73_m2} — ABNORMAL LOW (ref >60)
Glucose: 153 mg/dl — ABNORMAL HIGH (ref 70–140)
Potassium: 4 mEq/L (ref 3.5–5.1)
Sodium: 140 mEq/L (ref 136–145)
Total Bilirubin: <0.22 mg/dL (ref 0.20–1.20)
Total Protein: 6.9 g/dL (ref 6.4–8.3)

## 2017-05-17 LAB — CBC & DIFF AND RETIC
BASO%: 1.8 % (ref 0.0–2.0)
Basophils Absolute: 0.1 10*3/uL (ref 0.0–0.1)
EOS%: 3.5 % (ref 0.0–7.0)
Eosinophils Absolute: 0.3 10*3/uL (ref 0.0–0.5)
HCT: 32.8 % — ABNORMAL LOW (ref 34.8–46.6)
HGB: 9.9 g/dL — ABNORMAL LOW (ref 11.6–15.9)
Immature Retic Fract: 27 % — ABNORMAL HIGH (ref 1.60–10.00)
LYMPH%: 16.3 % (ref 14.0–49.7)
MCH: 21.3 pg — ABNORMAL LOW (ref 25.1–34.0)
MCHC: 30.2 g/dL — ABNORMAL LOW (ref 31.5–36.0)
MCV: 70.5 fL — ABNORMAL LOW (ref 79.5–101.0)
MONO#: 0.5 10*3/uL (ref 0.1–0.9)
MONO%: 6.5 % (ref 0.0–14.0)
NEUT#: 5.2 10*3/uL (ref 1.5–6.5)
NEUT%: 71.9 % (ref 38.4–76.8)
Platelets: 357 10*3/uL (ref 145–400)
RBC: 4.64 10*6/uL (ref 3.70–5.45)
RDW: 24.3 % — ABNORMAL HIGH (ref 11.2–14.5)
Retic %: 1.96 % (ref 0.70–2.10)
Retic Ct Abs: 90.94 10*3/uL — ABNORMAL HIGH (ref 33.70–90.70)
WBC: 7.3 10*3/uL (ref 3.9–10.3)
lymph#: 1.2 10*3/uL (ref 0.9–3.3)

## 2017-05-17 LAB — LACTATE DEHYDROGENASE: LDH: 149 U/L (ref 125–245)

## 2017-05-17 LAB — FERRITIN: Ferritin: 29 ng/ml (ref 9–269)

## 2017-05-17 NOTE — Progress Notes (Signed)
Based on treatment parameters per MD Perlov's notes the pt will not receive any blood or iron today.  Pt is asymptomatic, A&Oxx4, and ambulatory.

## 2017-05-18 ENCOUNTER — Telehealth: Payer: Self-pay | Admitting: Hematology and Oncology

## 2017-05-18 ENCOUNTER — Telehealth: Payer: Self-pay

## 2017-05-18 NOTE — Telephone Encounter (Signed)
A high priority message was sent to get patient an appointment sooner than 06/01/2017 per patient request and Dr. Lebron Conners approval.

## 2017-05-18 NOTE — Telephone Encounter (Signed)
Scheduled appointment per 1/3 sch msg. Spoke with patient regarding appts.

## 2017-05-20 NOTE — Progress Notes (Signed)
[facility] Follow-up Visit:  Assessment: Iron deficiency anemia secondary to blood loss (chronic) 67 y.o. female with intestinal AVM history with associated chronic GI blood loss resulting in iron deficiency and blood loss anemia. Patient has been managed with parenteral iron supplementation blood transfusions as the AVMs were to locate. History has been complicated by arterial thrombosis event for which patient does not currently receiving therapeutic anticoagulation due to history of bleeding. Shortly after our initial visit in the clinic, patient was admitted to the hospital with exacerbation of her anemia likely due to concurrent gastrointestinal bleeding and possible community-acquired pneumonia.  Patient was treated at the hospital and recovered after 2 units of packed red blood cells transfusion.    Patient recently had repeat endoscopies and no active bleeding is detected.  Hemoglobin is gradually rising.  Plan: --Based on the previous value, patient needs Venofer injection -we will plan on delivering want her next week- . --Blood support plan:  --QWk labs for now  --If Hgb <= 8.0 (based on patient becoming symptomatic from her anemia) -- transfuse 2 units pRBC and re-check Hgb. If not rising appropriately, admit to the hospital for additional transfusions  --If Hgb 8.1-9.0 -- transfuse 1 unit of pRBC and re-check Hgb --RTC in 6 weeks with labs & possible transfusion/iron infusion depending on the lab work results  Voice recognition software was used and creation of this note. Despite my best effort at editing the text, some misspelling/errors may have occurred.  Orders Placed This Encounter  Procedures  . CBC & Diff and Retic    Standing Status:   Future    Number of Occurrences:   1    Standing Expiration Date:   04/19/2018  . Comprehensive metabolic panel    Standing Status:   Future    Number of Occurrences:   1    Standing Expiration Date:   04/19/2018  .  Lactate dehydrogenase (LDH)    Standing Status:   Future    Number of Occurrences:   1    Standing Expiration Date:   04/19/2018  . Iron and TIBC    Standing Status:   Future    Number of Occurrences:   1    Standing Expiration Date:   04/19/2018  . Ferritin    Standing Status:   Future    Number of Occurrences:   1    Standing Expiration Date:   04/19/2018  . CBC with Differential    Standing Status:   Standing    Number of Occurrences:   10    Standing Expiration Date:   04/19/2018  . Hold Tube, Blood Bank    Standing Status:   Standing    Number of Occurrences:   10    Standing Expiration Date:   04/19/2018   All questions were answered.  . The patient knows to call the clinic with any problems, questions or concerns.  This note was electronically signed.    History of Presenting Illness Bethany [name] is a 67 y.o. female followed in the [facility] for assistance in management of chronic anemia due to running blood loss via AVMs in the GI tract. 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 Patient is not on therapeutic anticoagulation due to recurrent gastrointestinal bleeding. Additional past medical history includes insulin-dependent diabetes mellitus, hypertension, GERD, hypothyroidism, coronary artery disease. Patient also was diagnosed with protein C deficiency in the context of arterial clots.  At the present time, patient continues to smoke 0.5 packs per day which she has done over the past 40 years. She does not consume alcohol. Patient reports low energy, generalized slowing of the activities of daily living, easy fatigability. Also reports cramping in the lower extremities with ambulation. Denies fevers, chills, night sweats. No active chest pain or shortness of breath at rest. No significant abdominal pain, diarrhea, or constipation. No recurrent melena. Patient has been receiving support with parenteral iron supplementation and  blood transfusions for management of her chronic anemia.  Returns to the clinic for continued monitoring of her anemia.  Patient has had no new episodes of symptomatic anemia since last visit to the clinic.  No recurrent GI bleeding.  Patient is scheduled for colonoscopy on 04/17/17.  She does acknowledge having some alcohol over the past weekend.  Denies any other active symptoms.  Oncological/hematological History: --Labs, 03/01/17: Hgb   8.8, MCV 73.3, MCH 22.0, RDW 18.4, Plt 318; tProt 7.1, Alb 4.2, Ca 10.4, Cr 1.2, AP 125; TSH 4.74 --Labs, 03/07/17: Hgb   8.2, MCV 74.4, MCH 21.0, RWD 17.4, Plt 254; Fe 18, FeSat   4%, TIBC 453, Ferritin 15, Folate 9.3, Vit B12 783 --Admission, 10/29-11/02/18: Discharge diagnoses  --community-acquired pneumonia, COPD exacerbation, anemia due to acute blood loss and chronic anemia due to chronic gastrointestinal bleeding.    --Labs:     Hgb   6.5, MCV 71.6, MCH 20.3, RDW 17.8, Plt 279;    --Treatment: 2U pRBC, prednisone   --Labs, 04/03/17: Hgb   9.2, MCV 77.2, MCH 23.1, RDW 26.1, Plt 476; Fe 39, FeSat 11%, TIBC 349, Ferritin 79; AP 210, AST 44, ALT 87 --EGD/Colonoscopy, 04/17/17: Endoscopy demonstrates prepyloric patchy mild inflammatory changes.  No ulcers, no malignancy, no source of bleeding.  Colonoscopy significant for 0.6 cm cecal polyp, diverticulosis coli, grade 1 internal hemorrhoids and nonbleeding telangiectasia. --Labs, 04/19/17: Hgb 10.1, MCV 73.9, MCH 21.9, RDW 21.3, Plt 288;   Medical History: Past Medical History:  Diagnosis Date  . Acute renal failure ([place])   . Arthritis   . Diabetes mellitus without complication ([place])    type II   . Hypercalcemia   . Hypertension   . Renal disorder   . Single kidney   . Thrombosis     Surgical History: Past Surgical History:  Procedure Laterality Date  . ABDOMINAL HYSTERECTOMY    . blood clots removed from descending aorta     . CHOLECYSTECTOMY    . COLONOSCOPY    . COLONOSCOPY WITH PROPOFOL  N/A 04/17/2017   Procedure: COLONOSCOPY WITH PROPOFOL;  Surgeon: Wilford Corner, MD;  Location: [facility];  Service: Endoscopy;  Laterality: N/A;  . ESOPHAGOGASTRODUODENOSCOPY (EGD) WITH PROPOFOL N/A 04/17/2017   Procedure: ESOPHAGOGASTRODUODENOSCOPY (EGD) WITH PROPOFOL;  Surgeon: Wilford Corner, MD;  Location: [facility];  Service: Endoscopy;  Laterality: N/A;  . NEPHRECTOMY RECIPIENT      Family History: Family History  Problem Relation Age of Onset  . Anemia Mother   . Diabetes Father   . Hypertension Brother     Social History: Social History   Socioeconomic History  . Marital status: Single    Spouse name: Not on file  . Number of children: Not on file  . Years of education: Not on file  . Highest education level: Not on file  Social Needs  . Financial resource strain: Not on file  . Food insecurity - worry: Not on file  . Food insecurity - inability: Not on file  .  Transportation needs - medical: Not on file  . Transportation needs - non-medical: Not on file  Occupational History  . Occupation: retired   Tobacco Use  . Smoking status: Current Every Day Smoker    Types: Cigarettes  . Smokeless tobacco: Never Used  Substance and Sexual Activity  . Alcohol use: No  . Drug use: No  . Sexual activity: Not on file  Other Topics Concern  . Not on file  Social History Narrative   Lives with daughter    Dorie Rank from [place]     Allergies: Allergies  Allergen Reactions  . Contrast Media [Iodinated Diagnostic Agents]     Renal hypertension per physician    Medications:  Current Outpatient Medications  Medication Sig Dispense Refill  . amLODipine (NORVASC) 5 MG tablet Take 1 tablet (5 mg total) by mouth daily. 30 tablet 0  . B Complex Vitamins (B COMPLEX PO) Take 1 capsule by mouth daily.    . cholecalciferol (VITAMIN D) 1000 units tablet Take 1 tablet (1,000 Units total) by mouth once a week.    . gabapentin (NEURONTIN) 600 MG tablet Take 600 mg by mouth 3  (three) times daily.     . hydrALAZINE (APRESOLINE) 25 MG tablet Take 25 mg by mouth 3 (three) times daily.    Marland Kitchen HYDROcodone-acetaminophen (NORCO) 10-325 MG tablet Take 1 tablet by mouth every 6 (six) hours as needed for moderate pain.    . Insulin Glargine (TOUJEO SOLOSTAR) 300 UNIT/ML SOPN Inject 54 Units into the skin at bedtime.     . insulin lispro (HUMALOG KWIKPEN) 100 UNIT/ML KiwkPen Inject 22-24 Units into the skin See admin instructions. Inject 24 units with breakfast, 22 units with lunch, and 24 units with dinner    . Ipratropium-Albuterol (COMBIVENT) 20-100 MCG/ACT AERS respimat Inhale 1 puff into the lungs every 6 (six) hours as needed for wheezing or shortness of breath. 1 Inhaler 0  . levothyroxine (SYNTHROID, LEVOTHROID) 25 MCG tablet Take 12.5 mcg by mouth daily before breakfast.    . metoprolol (LOPRESSOR) 50 MG tablet Take 50 mg by mouth 2 (two) times daily.     . Multiple Vitamins-Minerals (EQ MULTIVITAMINS ADULT GUMMY) CHEW Chew 1 tablet by mouth daily.    . nicotine (NICODERM CQ - DOSED IN MG/24 HOURS) 21 mg/24hr patch Place 1 patch (21 mg total) onto the skin daily. 28 patch 0  . pantoprazole (PROTONIX) 40 MG tablet Take 40 mg by mouth daily.    Marland Kitchen PRALUENT 75 MG/ML SOPN Inject 75 mg as directed every 14 (fourteen) days.  4  . tiZANidine (ZANAFLEX) 2 MG tablet Take 2 mg by mouth at bedtime as needed for muscle spasms.     . polyethylene glycol (MIRALAX / GLYCOLAX) packet Take 17 g by mouth daily. 14 each 0   No current facility-administered medications for this visit.     Review of Systems: Review of Systems  Constitutional: Positive for fatigue.  Musculoskeletal: Positive for myalgias.  All other systems reviewed and are negative.    PHYSICAL EXAMINATION Blood pressure (!) 142/56, pulse 71, temperature 98.2 F (36.8 C), temperature source Oral, resp. rate 18, height 5\' 5"  (1.651 m), weight 191 lb 6.4 oz (86.8 kg), SpO2 100 %.  ECOG PERFORMANCE STATUS: 2 -  Symptomatic, <50% confined to bed  Physical Exam  Constitutional: She is oriented to person, place, and time and well-developed, well-nourished, and in no distress. No distress.  HENT:  Head: Normocephalic and atraumatic.  Mouth/Throat: Oropharynx is clear  and moist. No oropharyngeal exudate.  Eyes: Conjunctivae and EOM are normal. Pupils are equal, round, and reactive to light. No scleral icterus.  Neck: No thyromegaly present.  Cardiovascular: Normal rate, regular rhythm, normal heart sounds and intact distal pulses. Exam reveals no gallop.  No murmur heard. Pulmonary/Chest: Effort normal and breath sounds normal. No respiratory distress. She has no wheezes. She has no rales. She exhibits no tenderness.  Abdominal: Soft. Bowel sounds are normal. She exhibits no distension and no mass. There is no tenderness. There is no rebound and no guarding.  Musculoskeletal: Normal range of motion. She exhibits no edema.  Lymphadenopathy:    She has no cervical adenopathy.  Neurological: She is alert and oriented to person, place, and time. She has normal reflexes. No cranial nerve deficit. Coordination normal.  Skin: No rash noted. She is not diaphoretic. No erythema. There is pallor.     LABORATORY DATA: I have personally reviewed the data as listed: Appointment on 04/19/2017  Component Date Value Ref Range Status  . WBC 04/19/2017 7.0  3.9 - 10.3 10e3/uL Final  . NEUT# 04/19/2017 5.2  1.5 - 6.5 10e3/uL Final  . HGB 04/19/2017 10.1* 11.6 - 15.9 g/dL Final  . HCT 04/19/2017 34.0* 34.8 - 46.6 % Final  . Platelets 04/19/2017 288  145 - 400 10e3/uL Final  . MCV 04/19/2017 73.9* 79.5 - 101.0 fL Final  . MCH 04/19/2017 21.9* 25.1 - 34.0 pg Final  . MCHC 04/19/2017 29.6* 31.5 - 36.0 g/dL Final  . RBC 04/19/2017 4.60  3.70 - 5.45 10e6/uL Final  . RDW 04/19/2017 24.3* 11.2 - 14.5 % Final  . lymph# 04/19/2017 1.0  0.9 - 3.3 10e3/uL Final  . MONO# 04/19/2017 0.4  0.1 - 0.9 10e3/uL Final  .  Eosinophils Absolute 04/19/2017 0.3  0.0 - 0.5 10e3/uL Final  . Basophils Absolute 04/19/2017 0.1  0.0 - 0.1 10e3/uL Final  . NEUT% 04/19/2017 73.8  38.4 - 76.8 % Final  . LYMPH% 04/19/2017 15.0  14.0 - 49.7 % Final  . MONO% 04/19/2017 5.7  0.0 - 14.0 % Final  . EOS% 04/19/2017 4.5  0.0 - 7.0 % Final  . BASO% 04/19/2017 1.0  0.0 - 2.0 % Final  Admission on 04/17/2017, Discharged on 04/17/2017  Component Date Value Ref Range Status  . Glucose-Capillary 04/17/2017 163* 65 - 99 mg/dL Final       Ardath Sax, MD

## 2017-05-20 NOTE — Assessment & Plan Note (Signed)
67 y.o. female with intestinal AVM history with associated chronic GI blood loss resulting in iron deficiency and blood loss anemia. Patient has been managed with parenteral iron supplementation blood transfusions as the AVMs were to locate. History has been complicated by arterial thrombosis event for which patient does not currently receiving therapeutic anticoagulation due to history of bleeding. Shortly after our initial visit in the clinic, patient was admitted to the hospital with exacerbation of her anemia likely due to concurrent gastrointestinal bleeding and possible community-acquired pneumonia.  Patient was treated at the hospital and recovered after 2 units of packed red blood cells transfusion.    Patient recently had repeat endoscopies and no active bleeding is detected.  Hemoglobin is gradually rising.  Plan: --Based on the previous value, patient needs Venofer injection -we will plan on delivering want her next week- . --Blood support plan:  --QWk labs for now  --If Hgb <= 8.0 (based on patient becoming symptomatic from her anemia) -- transfuse 2 units pRBC and re-check Hgb. If not rising appropriately, admit to the hospital for additional transfusions  --If Hgb 8.1-9.0 -- transfuse 1 unit of pRBC and re-check Hgb --RTC in 6 weeks with labs & possible transfusion/iron infusion depending on the lab work results

## 2017-05-25 ENCOUNTER — Other Ambulatory Visit: Payer: Self-pay | Admitting: Hematology and Oncology

## 2017-05-25 ENCOUNTER — Telehealth: Payer: Self-pay

## 2017-05-25 ENCOUNTER — Encounter: Payer: Self-pay | Admitting: Hematology and Oncology

## 2017-05-25 ENCOUNTER — Telehealth: Payer: Self-pay | Admitting: Hematology and Oncology

## 2017-05-25 ENCOUNTER — Inpatient Hospital Stay: Payer: Medicare Other | Attending: Hematology and Oncology | Admitting: Hematology and Oncology

## 2017-05-25 VITALS — BP 144/52 | HR 71 | Temp 99.0°F | Resp 17 | Wt 192.8 lb

## 2017-05-25 DIAGNOSIS — Q2733 Arteriovenous malformation of digestive system vessel: Secondary | ICD-10-CM | POA: Diagnosis not present

## 2017-05-25 DIAGNOSIS — N6310 Unspecified lump in the right breast, unspecified quadrant: Secondary | ICD-10-CM

## 2017-05-25 DIAGNOSIS — E039 Hypothyroidism, unspecified: Secondary | ICD-10-CM

## 2017-05-25 DIAGNOSIS — K219 Gastro-esophageal reflux disease without esophagitis: Secondary | ICD-10-CM | POA: Diagnosis not present

## 2017-05-25 DIAGNOSIS — I2510 Atherosclerotic heart disease of native coronary artery without angina pectoris: Secondary | ICD-10-CM

## 2017-05-25 DIAGNOSIS — F17210 Nicotine dependence, cigarettes, uncomplicated: Secondary | ICD-10-CM

## 2017-05-25 DIAGNOSIS — D500 Iron deficiency anemia secondary to blood loss (chronic): Secondary | ICD-10-CM

## 2017-05-25 DIAGNOSIS — K769 Liver disease, unspecified: Secondary | ICD-10-CM

## 2017-05-25 DIAGNOSIS — I10 Essential (primary) hypertension: Secondary | ICD-10-CM

## 2017-05-25 DIAGNOSIS — R160 Hepatomegaly, not elsewhere classified: Secondary | ICD-10-CM

## 2017-05-25 DIAGNOSIS — E119 Type 2 diabetes mellitus without complications: Secondary | ICD-10-CM

## 2017-05-25 NOTE — Telephone Encounter (Signed)
Patient aware of 10:30 appointment on June 13, 2017 at [facility]. Patient aware to arrive at 10:15. Mammography release form signed by patient and faxed to [phone]- [facility]. Per Kenney Houseman at the [facility] patient instructed to call the [facility] and check to see if records have been received and she may be able to get an appointment sooner.

## 2017-05-25 NOTE — Telephone Encounter (Signed)
Gave avs and calendar for January  °

## 2017-05-30 ENCOUNTER — Other Ambulatory Visit: Payer: Medicare Other

## 2017-05-31 ENCOUNTER — Ambulatory Visit (HOSPITAL_COMMUNITY)
Admission: RE | Admit: 2017-05-31 | Discharge: 2017-05-31 | Disposition: A | Discharge status: Home / Self Care | Payer: Medicare Other | Source: Ambulatory Visit | Attending: Hematology and Oncology | Admitting: Hematology and Oncology

## 2017-05-31 DIAGNOSIS — N6313 Unspecified lump in the right breast, lower outer quadrant: Secondary | ICD-10-CM | POA: Insufficient documentation

## 2017-05-31 DIAGNOSIS — D3502 Benign neoplasm of left adrenal gland: Secondary | ICD-10-CM | POA: Diagnosis not present

## 2017-05-31 DIAGNOSIS — K769 Liver disease, unspecified: Secondary | ICD-10-CM

## 2017-05-31 MED ORDER — GADOBENATE DIMEGLUMINE 529 MG/ML IV SOLN
20.0000 mL | Freq: Once | INTRAVENOUS | Status: AC | PRN
  Administered 2017-05-31: 19 mL via INTRAVENOUS

## 2017-06-01 ENCOUNTER — Inpatient Hospital Stay: Payer: Medicare Other

## 2017-06-01 ENCOUNTER — Inpatient Hospital Stay (HOSPITAL_BASED_OUTPATIENT_CLINIC_OR_DEPARTMENT_OTHER): Payer: Medicare Other | Admitting: Hematology and Oncology

## 2017-06-01 ENCOUNTER — Ambulatory Visit: Payer: Medicare Other | Admitting: Hematology and Oncology

## 2017-06-01 ENCOUNTER — Telehealth: Payer: Self-pay | Admitting: *Deleted

## 2017-06-01 ENCOUNTER — Other Ambulatory Visit: Payer: Self-pay | Admitting: Hematology and Oncology

## 2017-06-01 DIAGNOSIS — Q2733 Arteriovenous malformation of digestive system vessel: Secondary | ICD-10-CM | POA: Diagnosis not present

## 2017-06-01 DIAGNOSIS — K5520 Angiodysplasia of colon without hemorrhage: Secondary | ICD-10-CM

## 2017-06-01 DIAGNOSIS — N6310 Unspecified lump in the right breast, unspecified quadrant: Secondary | ICD-10-CM | POA: Diagnosis not present

## 2017-06-01 DIAGNOSIS — D500 Iron deficiency anemia secondary to blood loss (chronic): Secondary | ICD-10-CM | POA: Diagnosis not present

## 2017-06-01 DIAGNOSIS — K769 Liver disease, unspecified: Secondary | ICD-10-CM

## 2017-06-01 DIAGNOSIS — R160 Hepatomegaly, not elsewhere classified: Secondary | ICD-10-CM

## 2017-06-01 LAB — SAMPLE TO BLOOD BANK

## 2017-06-01 LAB — HEMOGLOBIN AND HEMATOCRIT (CANCER CENTER ONLY)
HCT: 35.8 % (ref 34.8–46.6)
Hemoglobin: 10.4 g/dL — ABNORMAL LOW (ref 11.6–15.9)

## 2017-06-01 NOTE — Telephone Encounter (Signed)
"  Sieria calling to confirm patient test is in [facility] for MRI liver and to make sure he's aware of impressions."  Verified impression read by [place].  Routing call information to collaborative nurse and provider for review.  Further patient communication through collaborative nurse.

## 2017-06-02 ENCOUNTER — Telehealth: Payer: Self-pay | Admitting: Hematology and Oncology

## 2017-06-02 NOTE — Telephone Encounter (Signed)
Scheduled appt per 1/17 sch msg - spoke with patient regarding appts.

## 2017-06-05 ENCOUNTER — Inpatient Hospital Stay: Payer: Medicare Other

## 2017-06-05 VITALS — BP 157/50 | HR 53 | Temp 97.7°F | Resp 16

## 2017-06-05 DIAGNOSIS — Q2733 Arteriovenous malformation of digestive system vessel: Secondary | ICD-10-CM | POA: Diagnosis not present

## 2017-06-05 DIAGNOSIS — D500 Iron deficiency anemia secondary to blood loss (chronic): Secondary | ICD-10-CM

## 2017-06-05 DIAGNOSIS — K5520 Angiodysplasia of colon without hemorrhage: Secondary | ICD-10-CM

## 2017-06-05 MED ORDER — SODIUM CHLORIDE 0.9 % IV SOLN
200.0000 mg | Freq: Once | INTRAVENOUS | Status: AC
  Administered 2017-06-05: 200 mg via INTRAVENOUS
  Filled 2017-06-05: qty 10

## 2017-06-05 MED ORDER — SODIUM CHLORIDE 0.9 % IV SOLN
Freq: Once | INTRAVENOUS | Status: AC
  Administered 2017-06-05: 15:00:00 via INTRAVENOUS

## 2017-06-05 NOTE — Patient Instructions (Addendum)
Iron Sucrose injection What is this medicine? IRON SUCROSE (AHY ern SOO krohs) is an iron complex. Iron is used to make healthy red blood cells, which carry oxygen and nutrients throughout the body. This medicine is used to treat iron deficiency anemia in people with chronic kidney disease. This medicine may be used for other purposes; ask your health care provider or pharmacist if you have questions. COMMON BRAND NAME(S): Venofer What should I tell my health care provider before I take this medicine? They need to know if you have any of these conditions: -anemia not caused by low iron levels -heart disease -high levels of iron in the blood -kidney disease -liver disease -an unusual or allergic reaction to iron, other medicines, foods, dyes, or preservatives -pregnant or trying to get pregnant -breast-feeding How should I use this medicine? This medicine is for infusion into a vein. It is given by a health care professional in a hospital or clinic setting. Talk to your pediatrician regarding the use of this medicine in children. While this drug may be prescribed for children as young as 2 years for selected conditions, precautions do apply. Overdosage: If you think you have taken too much of this medicine contact a poison control center or emergency room at once. NOTE: This medicine is only for you. Do not share this medicine with others. What if I miss a dose? It is important not to miss your dose. Call your doctor or health care professional if you are unable to keep an appointment. What may interact with this medicine? Do not take this medicine with any of the following medications: -deferoxamine -dimercaprol -other iron products This medicine may also interact with the following medications: -chloramphenicol -deferasirox This list may not describe all possible interactions. Give your health care provider a list of all the medicines, herbs, non-prescription drugs, or dietary  supplements you use. Also tell them if you smoke, drink alcohol, or use illegal drugs. Some items may interact with your medicine. What should I watch for while using this medicine? Visit your doctor or healthcare professional regularly. Tell your doctor or healthcare professional if your symptoms do not start to get better or if they get worse. You may need blood work done while you are taking this medicine. You may need to follow a special diet. Talk to your doctor. Foods that contain iron include: whole grains/cereals, dried fruits, beans, or peas, leafy green vegetables, and organ meats (liver, kidney). What side effects may I notice from receiving this medicine? Side effects that you should report to your doctor or health care professional as soon as possible: -allergic reactions like skin rash, itching or hives, swelling of the face, lips, or tongue -breathing problems -changes in blood pressure -cough -fast, irregular heartbeat -feeling faint or lightheaded, falls -fever or chills -flushing, sweating, or hot feelings -joint or muscle aches/pains -seizures -swelling of the ankles or feet -unusually weak or tired Side effects that usually do not require medical attention (report to your doctor or health care professional if they continue or are bothersome): -diarrhea -feeling achy -headache -irritation at site where injected -nausea, vomiting -stomach upset -tiredness This list may not describe all possible side effects. Call your doctor for medical advice about side effects. You may report side effects to FDA at 1-800-FDA-1088. Where should I keep my medicine? This drug is given in a hospital or clinic and will not be stored at home. NOTE: This sheet is a summary. It may not cover all possible information. If   you have questions about this medicine, talk to your doctor, pharmacist, or health care provider.  2018 Elsevier/Gold Standard (2011-02-10 17:14:35)   Anemia Anemia is a  condition in which you do not have enough red blood cells or hemoglobin. Hemoglobin is a substance in red blood cells that carries oxygen. When you do not have enough red blood cells or hemoglobin (are anemic), your body cannot get enough oxygen and your organs may not work properly. As a result, you may feel very tired or have other problems. What are the causes? Common causes of anemia include:  Excessive bleeding. Anemia can be caused by excessive bleeding inside or outside the body, including bleeding from the intestine or from periods in women.  Poor nutrition.  Long-lasting (chronic) kidney, thyroid, and liver disease.  Bone marrow disorders.  Cancer and treatments for cancer.  HIV (human immunodeficiency virus) and AIDS (acquired immunodeficiency syndrome).  Treatments for HIV and AIDS.  Spleen problems.  Blood disorders.  Infections, medicines, and autoimmune disorders that destroy red blood cells.  What are the signs or symptoms? Symptoms of this condition include:  Minor weakness.  Dizziness.  Headache.  Feeling heartbeats that are irregular or faster than normal (palpitations).  Shortness of breath, especially with exercise.  Paleness.  Cold sensitivity.  Indigestion.  Nausea.  Difficulty sleeping.  Difficulty concentrating.  Symptoms may occur suddenly or develop slowly. If your anemia is mild, you may not have symptoms. How is this diagnosed? This condition is diagnosed based on:  Blood tests.  Your medical history.  A physical exam.  Bone marrow biopsy.  Your health care provider may also check your stool (feces) for blood and may do additional testing to look for the cause of your bleeding. You may also have other tests, including:  Imaging tests, such as a CT scan or MRI.  Endoscopy.  Colonoscopy.  How is this treated? Treatment for this condition depends on the cause. If you continue to lose a lot of blood, you may need to be  treated at a hospital. Treatment may include:  Taking supplements of iron, vitamin F63, or folic acid.  Taking a hormone medicine (erythropoietin) that can help to stimulate red blood cell growth.  Having a blood transfusion. This may be needed if you lose a lot of blood.  Making changes to your diet.  Having surgery to remove your spleen.  Follow these instructions at home:  Take over-the-counter and prescription medicines only as told by your health care provider.  Take supplements only as told by your health care provider.  Follow any diet instructions that you were given.  Keep all follow-up visits as told by your health care provider. This is important. Contact a health care provider if:  You develop new bleeding anywhere in the body. Get help right away if:  You are very weak.  You are short of breath.  You have pain in your abdomen or chest.  You are dizzy or feel faint.  You have trouble concentrating.  You have bloody or black, tarry stools.  You vomit repeatedly or you vomit up blood. Summary  Anemia is a condition in which you do not have enough red blood cells or enough of a substance in your red blood cells that carries oxygen (hemoglobin).  Symptoms may occur suddenly or develop slowly.  If your anemia is mild, you may not have symptoms.  This condition is diagnosed with blood tests as well as a medical history and physical exam. Other  tests may be needed.  Treatment for this condition depends on the cause of the anemia. This information is not intended to replace advice given to you by your health care provider. Make sure you discuss any questions you have with your health care provider. Document Released: 06/09/2004 Document Revised: 06/03/2016 Document Reviewed: 06/03/2016 Elsevier Interactive Patient Education  [employer].

## 2017-06-06 DIAGNOSIS — Z79891 Long term (current) use of opiate analgesic: Secondary | ICD-10-CM | POA: Diagnosis not present

## 2017-06-06 DIAGNOSIS — G894 Chronic pain syndrome: Secondary | ICD-10-CM | POA: Diagnosis not present

## 2017-06-08 DIAGNOSIS — N6310 Unspecified lump in the right breast, unspecified quadrant: Secondary | ICD-10-CM | POA: Insufficient documentation

## 2017-06-08 DIAGNOSIS — R160 Hepatomegaly, not elsewhere classified: Secondary | ICD-10-CM | POA: Insufficient documentation

## 2017-06-08 NOTE — Progress Notes (Signed)
[facility] Follow-up Visit:  Assessment: Iron deficiency anemia secondary to blood loss (chronic) 67 y.o. female with intestinal AVM history with associated chronic GI blood loss resulting in iron deficiency and blood loss anemia. Patient has been managed with parenteral iron supplementation blood transfusions as the AVMs were to locate. History has been complicated by arterial thrombosis event for which patient does not currently receiving therapeutic anticoagulation due to history of bleeding.   Interval drop in hemoglobin level with increasing microcytosis and hypochromia consistent with ongoing blood loss and iron depletion supported by the results of the iron panel from today.  Plan: --Based on the previous value, patient needs Venofer injection --Blood support plan:  --If Hgb <= 8.0 (based on patient becoming symptomatic from her anemia) -- transfuse 2 units pRBC and re-check Hgb. If not rising appropriately, admit to the hospital for additional transfusions  --If Hgb 8.1-9.0 -- transfuse 1 unit of pRBC and re-check Hgb  --If Hgb >9.0 --start iron pending on the day.  Ferritin levels.  Goal is to keep ferritin over 50 at all times and possibly even at 100 if anemia still persists at the lower value.   Breast mass, right Patient has previous history of breast mass that was discovered in [place] and reportedly biopsied with benign results.  Our recent imaging demonstrates persistent sizable breast mass with a satellite lesion.  Additional evaluation is clearly indicated.  Plan: -Obtain records from [place] -Bilateral diagnostic mammograms -Right breast ultrasound and ultrasound-guided biopsy if indicated will refer to breast cancer center for assessment  Liver mass 2 enhancing lesions in the liver based on CT scan which is a suboptimal study to assess there etiology.  In context of breast mass and this is a concerning finding, although patient could also have AVMs  in the liver considering her previous history of gastrointestinal malformations.  Plan: -MRI of the liver with and without contrast to further assess the lesions  Return to clinic in 1 month with lab work to assess for anemia and iron state or sooner if significant abnormalities are found on our studies.  Voice recognition software was used and creation of this note. Despite my best effort at editing the text, some misspelling/errors may have occurred.  Orders Placed This Encounter  Procedures  . MR LIVER W WO CONTRAST    Standing Status:   Future    Number of Occurrences:   1    Standing Expiration Date:   07/24/2018    Order Specific Question:   If indicated for the ordered procedure, I authorize the administration of contrast media per Radiology protocol    Answer:   Yes    Order Specific Question:   What is the patient's sedation requirement?    Answer:   No Sedation    Order Specific Question:   Does the patient have a pacemaker or implanted devices?    Answer:   No    Order Specific Question:   Radiology Contrast Protocol - do NOT remove file path    Answer:   [email or web address]    Order Specific Question:   Preferred imaging location?    Answer:   [facility] (table limit-350 lbs)  . MM DIAG BREAST TOMO BILATERAL    MC/PF 3 YRS AGO [facility], [place], NATALIE @ OFFICE IS HAVING PT FILL OUT RELEASE AND WILL FAX TO MED REC/NO PERSONAL HX, PREV BX OF RT BREAST, NO IMPLANTS/NO NEEDS/TA/NATALIE W/EPIC ORDER COSIGN REQUESTED  Standing Status:   Future    Standing Expiration Date:   07/24/2018    Order Specific Question:   Reason for Exam (SYMPTOM  OR DIAGNOSIS REQUIRED)    Answer:   2 mass lesions in the right breast by recent CT, please evaluate    Order Specific Question:   Preferred imaging location?    Answer:   [facility]  . US BREAST LTD UNI RIGHT INC AXILLA    MC/PF 3 YRS AGO [facility] [place], [place]ALIE @ OFFICE IS HAVING PT FILL OUT RELEASE AND WILL FAX TO MED REC/NO PERSONAL HX, PREV BX OF RT BREAST, NO IMPLANTS/NO NEEDS/TA/NATALIE W/EPIC ORDER COSIGN REQUESTED **POSSIBLE BX PER OFFICE    Standing Status:   Future    Standing Expiration Date:   07/24/2018    Order Specific Question:   Reason for Exam (SYMPTOM  OR DIAGNOSIS REQUIRED)    Answer:   2 mass lesions in the right breast by recent CT, please evaluate    Order Specific Question:   Preferred imaging location?    Answer:   [facility]  . [place] RT BREAST BX W LOC DEV 1ST LESION IMG BX SPEC US GUIDE    MC/PF 3 YRS AGO [facility] [place], [place]ALIE @ OFFICE IS HAVING PT FILL OUT RELEASE AND WILL FAX TO MED REC/NO PERSONAL HX, PREV BX OF RT BREAST, NO IMPLANTS/NO NEEDS/TA/NATALIE W/EPIC ORDER COSIGN REQUESTED **POSSIBLE BX PER OFFICE    Standing Status:   Future    Standing Expiration Date:   07/24/2018    Order Specific Question:   Reason for Exam (SYMPTOM  OR DIAGNOSIS REQUIRED)    Answer:   2 mass lesions in the right breast by recent CT, please evaluate    Order Specific Question:   Preferred imaging location?    Answer:   [facility]   All questions were answered.  . The patient knows to call the clinic with any problems, questions or concerns.  This note was electronically signed.    History of Presenting Illness Bethany [name] is a 67 y.o. female followed in the [place] for assistance in management of chronic anemia due to running blood loss via AVMs in the GI tract. 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 Patient is not on therapeutic anticoagulation due to recurrent gastrointestinal bleeding. Additional past medical history includes insulin-dependent diabetes mellitus, hypertension, GERD, hypothyroidism, coronary artery disease. Patient also was diagnosed with protein C deficiency in the context of arterial clots.  At the present  time, patient continues to smoke 0.5 packs per day which she has done over the past 40 years. She does not consume alcohol. Patient reports low energy, generalized slowing of the activities of daily living, easy fatigability. Also reports cramping in the lower extremities with ambulation. Denies fevers, chills, night sweats. No active chest pain or shortness of breath at rest. No significant abdominal pain, diarrhea, or constipation. No recurrent melena. Patient has been receiving support with parenteral iron supplementation and blood transfusions for management of her chronic anemia.  Returns to the clinic for continued monitoring of her anemia as well as to review new findings of breast and liver masses on imaging.  Report will be reviewed in the history below.  Of note, patient does have a history of irregularity in the breast and had a biopsy in [place] about 3 years ago.  Patient reports occasional nosebleeds, but  otherwise feeling reasonably well without any new complaints.   Oncological/hematological History: --Labs, 03/01/17: Hgb   8.8, MCV 73.3, MCH 22.0, Plt 318; tProt 7.1, Alb 4.2, Ca 10.4, Cr 1.2, AP 125; TSH 4.74 --Labs, 03/07/17: Hgb   8.2, MCV 74.4, MCH 21.0, Plt 254; Fe 18, FeSat   4%, TIBC 453, Ferritin 15, Folate 9.3, Vit B12 783 --Admission, 10/29-11/02/18: Discharge diagnoses  --community-acquired pneumonia, COPD exacerbation, anemia due to acute blood loss and chronic anemia due to chronic gastrointestinal bleeding.    --Labs:     Hgb   6.5, MCV 71.6, MCH 20.3, Plt 279;    --Treatment: 2U pRBC, prednisone   --Labs, 04/03/17: Hgb   9.2, MCV 77.2, MCH 23.1, Plt 476; Fe 39, FeSat 11%, TIBC 349, Ferritin 79; AP 210, AST 44, ALT 87 --EGD/Colonoscopy, 04/17/17: Endoscopy demonstrates prepyloric patchy mild inflammatory changes.  No ulcers, no malignancy, no source of bleeding.  Colonoscopy significant for 0.6 cm cecal polyp, diverticulosis coli, grade 1 internal hemorrhoids and nonbleeding  telangiectasia. --Labs, 04/19/17: Hgb 10.1, MCV 73.9, MCH 21.9, Plt 288; --CT C/A/P, 05/01/17: 2.8 cm density in the posterior right breast as well as a 1.5 cm additional nodule in the same breast.  1.3 cm enhancing abnormality in the right hepatic lobe as well as a 3.3 cm abnormality in the left lobe of the liver. --Labs, 05/17/17: Hgb   9.9, MCV 70.5, MCH 21.3, Plt 357; Fe 31, FeSat   8%, TIBC 389, Ferritin 29;  Medical History: Past Medical History:  Diagnosis Date  . Acute renal failure ([place])   . Arthritis   . Diabetes mellitus without complication ([place])    type II   . Hypercalcemia   . Hypertension   . Renal disorder   . Single kidney   . Thrombosis     Surgical History: Past Surgical History:  Procedure Laterality Date  . ABDOMINAL HYSTERECTOMY    . blood clots removed from descending aorta     . CHOLECYSTECTOMY    . COLONOSCOPY    . COLONOSCOPY WITH PROPOFOL N/A 04/17/2017   Procedure: COLONOSCOPY WITH PROPOFOL;  Surgeon: Wilford Corner, MD;  Location: [facility];  Service: Endoscopy;  Laterality: N/A;  . ESOPHAGOGASTRODUODENOSCOPY (EGD) WITH PROPOFOL N/A 04/17/2017   Procedure: ESOPHAGOGASTRODUODENOSCOPY (EGD) WITH PROPOFOL;  Surgeon: Wilford Corner, MD;  Location: [facility];  Service: Endoscopy;  Laterality: N/A;  . NEPHRECTOMY RECIPIENT      Family History: Family History  Problem Relation Age of Onset  . Anemia Mother   . Diabetes Father   . Hypertension Brother     Social History: Social History   Socioeconomic History  . Marital status: Single    Spouse name: Not on file  . Number of children: Not on file  . Years of education: Not on file  . Highest education level: Not on file  Social Needs  . Financial resource strain: Not on file  . Food insecurity - worry: Not on file  . Food insecurity - inability: Not on file  . Transportation needs - medical: Not on file  . Transportation needs - non-medical: Not on file  Occupational History  .  Occupation: retired   Tobacco Use  . Smoking status: Current Every Day Smoker    Types: Cigarettes  . Smokeless tobacco: Never Used  Substance and Sexual Activity  . Alcohol use: No  . Drug use: No  . Sexual activity: Not on file  Other Topics Concern  . Not on file  Social History  Narrative   Lives with daughter    Dorie Rank from [place]     Allergies: Allergies  Allergen Reactions  . Contrast Media [Iodinated Diagnostic Agents]     Renal hypertension per physician    Medications:  Current Outpatient Medications  Medication Sig Dispense Refill  . amLODipine (NORVASC) 5 MG tablet Take 1 tablet (5 mg total) by mouth daily. 30 tablet 0  . B Complex Vitamins (B COMPLEX PO) Take 1 capsule by mouth daily.    . cholecalciferol (VITAMIN D) 1000 units tablet Take 1 tablet (1,000 Units total) by mouth once a week.    . gabapentin (NEURONTIN) 600 MG tablet Take 600 mg by mouth 3 (three) times daily.     . hydrALAZINE (APRESOLINE) 25 MG tablet Take 25 mg by mouth 3 (three) times daily.    Marland Kitchen HYDROcodone-acetaminophen (NORCO) 10-325 MG tablet Take 1 tablet by mouth every 6 (six) hours as needed for moderate pain.    . Insulin Glargine (TOUJEO SOLOSTAR) 300 UNIT/ML SOPN Inject 54 Units into the skin at bedtime.     . insulin lispro (HUMALOG KWIKPEN) 100 UNIT/ML KiwkPen Inject 22-24 Units into the skin See admin instructions. Inject 24 units with breakfast, 22 units with lunch, and 24 units with dinner    . Ipratropium-Albuterol (COMBIVENT) 20-100 MCG/ACT AERS respimat Inhale 1 puff into the lungs every 6 (six) hours as needed for wheezing or shortness of breath. 1 Inhaler 0  . levothyroxine (SYNTHROID, LEVOTHROID) 25 MCG tablet Take 12.5 mcg by mouth daily before breakfast.    . metoprolol (LOPRESSOR) 50 MG tablet Take 50 mg by mouth 2 (two) times daily.     . Multiple Vitamins-Minerals (EQ MULTIVITAMINS ADULT GUMMY) CHEW Chew 1 tablet by mouth daily.    . nicotine (NICODERM CQ - DOSED IN MG/24 HOURS)  21 mg/24hr patch Place 1 patch (21 mg total) onto the skin daily. 28 patch 0  . pantoprazole (PROTONIX) 40 MG tablet Take 40 mg by mouth daily.    . polyethylene glycol (MIRALAX / GLYCOLAX) packet Take 17 g by mouth daily. 14 each 0  . PRALUENT 75 MG/ML SOPN Inject 75 mg as directed every 14 (fourteen) days.  4  . tiZANidine (ZANAFLEX) 2 MG tablet Take 2 mg by mouth at bedtime as needed for muscle spasms.      No current facility-administered medications for this visit.     Review of Systems: Review of Systems  Constitutional: Positive for fatigue.  Musculoskeletal: Positive for myalgias.  All other systems reviewed and are negative.    PHYSICAL EXAMINATION Blood pressure (!) 144/52, pulse 71, temperature 99 F (37.2 C), temperature source Oral, resp. rate 17, weight 192 lb 12.8 oz (87.5 kg), SpO2 96 %.  ECOG PERFORMANCE STATUS: 2 - Symptomatic, <50% confined to bed  Physical Exam  Constitutional: She is oriented to person, place, and time and well-developed, well-nourished, and in no distress. No distress.  HENT:  Head: Normocephalic and atraumatic.  Mouth/Throat: Oropharynx is clear and moist. No oropharyngeal exudate.  Eyes: Conjunctivae and EOM are normal. Pupils are equal, round, and reactive to light. No scleral icterus.  Neck: No thyromegaly present.  Cardiovascular: Normal rate, regular rhythm, normal heart sounds and intact distal pulses. Exam reveals no gallop.  No murmur heard. Pulmonary/Chest: Effort normal and breath sounds normal. No respiratory distress. She has no wheezes. She has no rales. She exhibits no tenderness.  Abdominal: Soft. Bowel sounds are normal. She exhibits no distension and no mass. There  is no tenderness. There is no rebound and no guarding.  Musculoskeletal: Normal range of motion. She exhibits no edema.  Lymphadenopathy:    She has no cervical adenopathy.  Neurological: She is alert and oriented to person, place, and time. She has normal  reflexes. No cranial nerve deficit. Coordination normal.  Skin: No rash noted. She is not diaphoretic. No erythema. There is pallor.     LABORATORY DATA: I have personally reviewed the data as listed: No visits with results within 1 Week(s) from this visit.  Latest known visit with results is:  Appointment on 05/17/2017  Component Date Value Ref Range Status  . WBC 05/17/2017 7.3  3.9 - 10.3 10e3/uL Final  . NEUT# 05/17/2017 5.2  1.5 - 6.5 10e3/uL Final  . HGB 05/17/2017 9.9* 11.6 - 15.9 g/dL Final  . HCT 05/17/2017 32.8* 34.8 - 46.6 % Final  . Platelets 05/17/2017 357  145 - 400 10e3/uL Final  . MCV 05/17/2017 70.5* 79.5 - 101.0 fL Final  . MCH 05/17/2017 21.3* 25.1 - 34.0 pg Final  . MCHC 05/17/2017 30.2* 31.5 - 36.0 g/dL Final  . RBC 05/17/2017 4.64  3.70 - 5.45 10e6/uL Final  . RDW 05/17/2017 24.3* 11.2 - 14.5 % Final  . lymph# 05/17/2017 1.2  0.9 - 3.3 10e3/uL Final  . MONO# 05/17/2017 0.5  0.1 - 0.9 10e3/uL Final  . Eosinophils Absolute 05/17/2017 0.3  0.0 - 0.5 10e3/uL Final  . Basophils Absolute 05/17/2017 0.1  0.0 - 0.1 10e3/uL Final  . NEUT% 05/17/2017 71.9  38.4 - 76.8 % Final  . LYMPH% 05/17/2017 16.3  14.0 - 49.7 % Final  . MONO% 05/17/2017 6.5  0.0 - 14.0 % Final  . EOS% 05/17/2017 3.5  0.0 - 7.0 % Final  . BASO% 05/17/2017 1.8  0.0 - 2.0 % Final  . Retic % 05/17/2017 1.96  0.70 - 2.10 % Final  . Retic Ct Abs 05/17/2017 90.94* 33.70 - 90.70 10e3/uL Final  . Immature Retic Fract 05/17/2017 27.00* 1.60 - 10.00 % Final  . Sodium 05/17/2017 140  136 - 145 mEq/L Final  . Potassium 05/17/2017 4.0  3.5 - 5.1 mEq/L Final  . Chloride 05/17/2017 105  98 - 109 mEq/L Final  . CO2 05/17/2017 24  22 - 29 mEq/L Final  . Glucose 05/17/2017 153* 70 - 140 mg/dl Final   Glucose reference range is for nonfasting patients. Fasting glucose reference range is 70- 100.  Marland Kitchen BUN 05/17/2017 18.9  7.0 - 26.0 mg/dL Final  . Creatinine 05/17/2017 1.2* 0.6 - 1.1 mg/dL Final  . Total Bilirubin  05/17/2017 <0.22  0.20 - 1.20 mg/dL Final  . Alkaline Phosphatase 05/17/2017 121  40 - 150 U/L Final  . AST 05/17/2017 15  5 - 34 U/L Final  . ALT 05/17/2017 16  0 - 55 U/L Final  . Total Protein 05/17/2017 6.9  6.4 - 8.3 g/dL Final  . Albumin 05/17/2017 3.6  3.5 - 5.0 g/dL Final  . Calcium 05/17/2017 10.0  8.4 - 10.4 mg/dL Final  . Anion Gap 05/17/2017 10  3 - 11 mEq/L Final  . EGFR 05/17/2017 54* >60 ml/min/1.73 m2 Final   eGFR is calculated using the CKD-EPI Creatinine Equation (2009)  . LDH 05/17/2017 149  125 - 245 U/L Final  . Iron 05/17/2017 31* 41 - 142 ug/dL Final  . TIBC 05/17/2017 389  236 - 444 ug/dL Final  . UIBC 05/17/2017 358  120 - 384 ug/dL Final  . %SAT  05/17/2017 8* 21 - 57 % Final  . Ferritin 05/17/2017 29  9 - 269 ng/ml Final       Ardath Sax, MD

## 2017-06-08 NOTE — Assessment & Plan Note (Addendum)
Patient has previous history of breast mass that was discovered in [place] and reportedly biopsied with benign results.  Our recent imaging demonstrates persistent sizable breast mass with a satellite lesion.  Additional evaluation is clearly indicated.  Plan: -Obtain records from [place] -Bilateral diagnostic mammograms -Right breast ultrasound and ultrasound-guided biopsy if indicated will refer to breast cancer center for assessment

## 2017-06-08 NOTE — Assessment & Plan Note (Signed)
67 y.o. female with intestinal AVM history with associated chronic GI blood loss resulting in iron deficiency and blood loss anemia. Patient has been managed with parenteral iron supplementation blood transfusions as the AVMs were to locate. History has been complicated by arterial thrombosis event for which patient does not currently receiving therapeutic anticoagulation due to history of bleeding.   Interval drop in hemoglobin level with increasing microcytosis and hypochromia consistent with ongoing blood loss and iron depletion supported by the results of the iron panel from today.  Plan: --Based on the previous value, patient needs Venofer injection --Blood support plan:  --If Hgb <= 8.0 (based on patient becoming symptomatic from her anemia) -- transfuse 2 units pRBC and re-check Hgb. If not rising appropriately, admit to the hospital for additional transfusions  --If Hgb 8.1-9.0 -- transfuse 1 unit of pRBC and re-check Hgb  --If Hgb >9.0 --start iron pending on the day.  Ferritin levels.  Goal is to keep ferritin over 50 at all times and possibly even at 100 if anemia still persists at the lower value.

## 2017-06-08 NOTE — Assessment & Plan Note (Signed)
2 enhancing lesions in the liver based on CT scan which is a suboptimal study to assess there etiology.  In context of breast mass and this is a concerning finding, although patient could also have AVMs in the liver considering her previous history of gastrointestinal malformations.  Plan: -MRI of the liver with and without contrast to further assess the lesions

## 2017-06-13 ENCOUNTER — Other Ambulatory Visit: Payer: Self-pay | Admitting: Hematology and Oncology

## 2017-06-13 ENCOUNTER — Ambulatory Visit
Admission: RE | Admit: 2017-06-13 | Discharge: 2017-06-13 | Disposition: A | Discharge status: Home / Self Care | Payer: Medicare Other | Source: Ambulatory Visit | Attending: Hematology and Oncology | Admitting: Hematology and Oncology

## 2017-06-13 ENCOUNTER — Ambulatory Visit: Admission: RE | Admit: 2017-06-13 | Payer: Medicare Other | Source: Ambulatory Visit

## 2017-06-13 DIAGNOSIS — N6310 Unspecified lump in the right breast, unspecified quadrant: Secondary | ICD-10-CM

## 2017-06-20 ENCOUNTER — Inpatient Hospital Stay: Payer: Medicare Other

## 2017-06-20 ENCOUNTER — Inpatient Hospital Stay: Payer: Medicare Other | Attending: Hematology and Oncology | Admitting: Hematology and Oncology

## 2017-06-20 ENCOUNTER — Telehealth: Payer: Self-pay | Admitting: Hematology and Oncology

## 2017-06-20 ENCOUNTER — Other Ambulatory Visit: Payer: Self-pay

## 2017-06-20 ENCOUNTER — Encounter: Payer: Self-pay | Admitting: Hematology and Oncology

## 2017-06-20 VITALS — BP 151/56 | HR 66 | Temp 98.4°F | Resp 16 | Ht 65.0 in | Wt 194.3 lb

## 2017-06-20 DIAGNOSIS — Q2733 Arteriovenous malformation of digestive system vessel: Secondary | ICD-10-CM | POA: Diagnosis not present

## 2017-06-20 DIAGNOSIS — K922 Gastrointestinal hemorrhage, unspecified: Secondary | ICD-10-CM

## 2017-06-20 DIAGNOSIS — I2510 Atherosclerotic heart disease of native coronary artery without angina pectoris: Secondary | ICD-10-CM

## 2017-06-20 DIAGNOSIS — R160 Hepatomegaly, not elsewhere classified: Secondary | ICD-10-CM

## 2017-06-20 DIAGNOSIS — E039 Hypothyroidism, unspecified: Secondary | ICD-10-CM

## 2017-06-20 DIAGNOSIS — D500 Iron deficiency anemia secondary to blood loss (chronic): Secondary | ICD-10-CM

## 2017-06-20 DIAGNOSIS — K5520 Angiodysplasia of colon without hemorrhage: Secondary | ICD-10-CM

## 2017-06-20 DIAGNOSIS — K219 Gastro-esophageal reflux disease without esophagitis: Secondary | ICD-10-CM | POA: Diagnosis not present

## 2017-06-20 DIAGNOSIS — N6310 Unspecified lump in the right breast, unspecified quadrant: Secondary | ICD-10-CM

## 2017-06-20 LAB — CBC WITH DIFFERENTIAL (CANCER CENTER ONLY)
Basophils Absolute: 0.1 10*3/uL (ref 0.0–0.1)
Basophils Relative: 2 %
Eosinophils Absolute: 0.2 10*3/uL (ref 0.0–0.5)
Eosinophils Relative: 3 %
HCT: 31 % — ABNORMAL LOW (ref 34.8–46.6)
Hemoglobin: 9.2 g/dL — ABNORMAL LOW (ref 11.6–15.9)
Lymphocytes Relative: 15 %
Lymphs Abs: 1 10*3/uL (ref 0.9–3.3)
MCH: 20.7 pg — ABNORMAL LOW (ref 25.1–34.0)
MCHC: 29.8 g/dL — ABNORMAL LOW (ref 31.5–36.0)
MCV: 69.4 fL — ABNORMAL LOW (ref 79.5–101.0)
Monocytes Absolute: 0.5 10*3/uL (ref 0.1–0.9)
Monocytes Relative: 7 %
Neutro Abs: 4.8 10*3/uL (ref 1.5–6.5)
Neutrophils Relative %: 73 %
Platelet Count: 395 10*3/uL (ref 145–400)
RBC: 4.47 MIL/uL (ref 3.70–5.45)
RDW: 23.2 % — ABNORMAL HIGH (ref 11.2–14.5)
WBC Count: 6.5 10*3/uL (ref 3.9–10.3)

## 2017-06-20 LAB — FERRITIN: Ferritin: 33 ng/mL (ref 9–269)

## 2017-06-20 LAB — CMP (CANCER CENTER ONLY)
ALT: 18 U/L (ref 0–55)
AST: 12 U/L (ref 5–34)
Albumin: 3.6 g/dL (ref 3.5–5.0)
Alkaline Phosphatase: 140 U/L (ref 40–150)
Anion gap: 11 (ref 3–11)
BUN: 17 mg/dL (ref 7–26)
CO2: 24 mmol/L (ref 22–29)
Calcium: 10.2 mg/dL (ref 8.4–10.4)
Chloride: 105 mmol/L (ref 98–109)
Creatinine: 1.25 mg/dL — ABNORMAL HIGH (ref 0.60–1.10)
GFR, Est AFR Am: 50 mL/min — ABNORMAL LOW (ref >60)
GFR, Estimated: 43 mL/min — ABNORMAL LOW (ref >60)
Glucose, Bld: 191 mg/dL — ABNORMAL HIGH (ref 70–140)
Potassium: 3.7 mmol/L (ref 3.5–5.1)
Sodium: 140 mmol/L (ref 136–145)
Total Bilirubin: <0.2 mg/dL — ABNORMAL LOW (ref 0.2–1.2)
Total Protein: 6.8 g/dL (ref 6.4–8.3)

## 2017-06-20 LAB — SAMPLE TO BLOOD BANK

## 2017-06-20 LAB — IRON AND TIBC
Iron: 21 ug/dL — ABNORMAL LOW (ref 41–142)
Saturation Ratios: 6 % — ABNORMAL LOW (ref 21–57)
TIBC: 376 ug/dL (ref 236–444)
UIBC: 355 ug/dL

## 2017-06-20 NOTE — Assessment & Plan Note (Signed)
Patient has previous history of breast mass that was discovered in [place] and reportedly biopsied with benign results.  Our recent imaging demonstrates persistent sizable breast mass with a satellite lesion.  Additional evaluation is clearly indicated.  Plan: -Obtain records from [place] -Bilateral diagnostic mammograms -Right breast ultrasound and ultrasound-guided biopsy if indicated will refer to breast cancer center for assessment

## 2017-06-20 NOTE — Assessment & Plan Note (Signed)
2 enhancing lesions in the liver based on CT scan which is a suboptimal study to assess there etiology.  Appearance by MRI is consistent with nodular hyperplasia, but continued follow-up is recommended considering metastatic disease is not fully excluded.  Plan: -Repeat MRI of the liver in 2-3 months.

## 2017-06-20 NOTE — Progress Notes (Signed)
[facility] Follow-up Visit:  Assessment: Iron deficiency anemia secondary to blood loss (chronic) 67 y.o. female with intestinal AVM history with associated chronic GI blood loss resulting in iron deficiency and blood loss anemia. Patient has been managed with parenteral iron supplementation blood transfusions as the AVMs were to locate. History has been complicated by arterial thrombosis event for which patient does not currently receiving therapeutic anticoagulation due to history of bleeding. Shortly after our initial visit in the clinic, patient was admitted to the hospital with exacerbation of her anemia likely due to concurrent gastrointestinal bleeding and possible community-acquired pneumonia.  Patient was treated at the hospital and recovered after 2 units of packed red blood cells transfusion.    Patient recently had repeat endoscopies and no active bleeding is detected.  Hemoglobin is gradually rising.  No need for blood transfusion today.  Plan: --Based on the previous value, patient needs Venofer injection - we will plan on delivering want her next week --Blood support plan:  --If Hgb <= 8.0 (based on patient becoming symptomatic from her anemia) -- transfuse 2 units pRBC and re-check Hgb. If not rising appropriately, admit to the hospital for additional transfusions  --If Hgb 8.1-9.0 -- transfuse 1 unit of pRBC and re-check Hgb   Liver mass 2 enhancing lesions in the liver based on CT scan which is a suboptimal study to assess there etiology.  Appearance by MRI is consistent with nodular hyperplasia, but continued follow-up is recommended considering metastatic disease is not fully excluded.  Plan: -Repeat MRI of the liver in 2-3 months.  Breast mass, right Patient has previous history of breast mass that was discovered in [place] and reportedly biopsied with benign results.  Our recent imaging demonstrates persistent sizable breast mass with a satellite  lesion.  Additional evaluation is clearly indicated.  Plan: -Obtain records from [place] -Bilateral diagnostic mammograms -Right breast ultrasound and ultrasound-guided biopsy if indicated will refer to breast cancer center for assessment  Return to clinic after undergoing breast evaluation scheduled for 29 January.  Voice recognition software was used and creation of this note. Despite my best effort at editing the text, some misspelling/errors may have occurred.  Orders Placed This Encounter  Procedures  . CBC with Differential (Cancer Center Only)    Standing Status:   Future    Number of Occurrences:   1    Standing Expiration Date:   06/01/2018  . CMP ([facility] only)    Standing Status:   Future    Number of Occurrences:   1    Standing Expiration Date:   06/01/2018  . AFP tumor marker    Standing Status:   Future    Number of Occurrences:   1    Standing Expiration Date:   06/01/2018  . CA 19.9    Standing Status:   Future    Number of Occurrences:   1    Standing Expiration Date:   06/01/2018  . Iron and TIBC    Standing Status:   Future    Number of Occurrences:   1    Standing Expiration Date:   06/01/2018  . Ferritin    Standing Status:   Future    Number of Occurrences:   1    Standing Expiration Date:   06/01/2018   All questions were answered.  . The patient knows to call the clinic with any problems, questions or concerns.  This note was electronically signed.    History  of Presenting Illness Bethany [name] is a 67 y.o. female followed in the [facility] for assistance in management of chronic anemia due to running blood loss via AVMs in the GI tract. 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 Patient is not on therapeutic anticoagulation due to recurrent gastrointestinal bleeding. Additional past medical history includes insulin-dependent diabetes mellitus, hypertension, GERD, hypothyroidism, coronary artery  disease. Patient also was diagnosed with protein C deficiency in the context of arterial clots.  At the present time, patient continues to smoke 0.5 packs per day which she has done over the past 40 years. She does not consume alcohol. Patient reports low energy, generalized slowing of the activities of daily living, easy fatigability. Also reports cramping in the lower extremities with ambulation. Denies fevers, chills, night sweats. No active chest pain or shortness of breath at rest. No significant abdominal pain, diarrhea, or constipation. No recurrent melena. Patient has been receiving support with parenteral iron supplementation and blood transfusions for management of her chronic anemia.  Returns to the clinic for continued monitoring of her anemia as well as to review new findings of breast and liver masses on imaging.  Reports some episodes of black tarry stools.  Denies any new symptoms.  Oncological/hematological History: --Labs, 03/01/17: Hgb   8.8, MCV 73.3, MCH 22.0, Plt 318; tProt 7.1, Alb 4.2, Ca 10.4, Cr 1.2, AP 125; TSH 4.74 --Labs, 03/07/17: Hgb   8.2, MCV 74.4, MCH 21.0, Plt 254; Fe 18, FeSat   4%, TIBC 453, Ferritin 15, Folate 9.3, Vit B12 783 --Admission, 10/29-11/02/18: Discharge diagnoses  --community-acquired pneumonia, COPD exacerbation, anemia due to acute blood loss and chronic anemia due to chronic gastrointestinal bleeding.    --Labs:     Hgb   6.5, MCV 71.6, MCH 20.3, Plt 279;    --Treatment: 2U pRBC, prednisone   --Labs, 04/03/17: Hgb   9.2, MCV 77.2, MCH 23.1, Plt 476; Fe 39, FeSat 11%, TIBC 349, Ferritin 79; AP 210, AST 44, ALT 87 --EGD/Colonoscopy, 04/17/17: Endoscopy demonstrates prepyloric patchy mild inflammatory changes.  No ulcers, no malignancy, no source of bleeding.  Colonoscopy significant for 0.6 cm cecal polyp, diverticulosis coli, grade 1 internal hemorrhoids and nonbleeding telangiectasia. --Labs, 04/19/17: Hgb 10.1, MCV 73.9, MCH 21.9, Plt 288; --CT  C/A/P, 05/01/17: 2.8 cm density in the posterior right breast as well as a 1.5 cm additional nodule in the same breast.  1.3 cm enhancing abnormality in the right hepatic lobe as well as a 3.3 cm abnormality in the left lobe of the liver. --Labs, 05/17/17: Hgb   9.9, MCV 70.5, MCH 21.3, Plt 357; Fe 31, FeSat   8%, TIBC 389, Ferritin 29; --MRI Liver, 05/31/17: Focal nodular hyperplasia versus metastatic disease, hepatocellular carcinoma less likely.  Medical History: Past Medical History:  Diagnosis Date  . Acute renal failure ([facility])   . Arthritis   . Diabetes mellitus without complication ([place])    type II   . Hypercalcemia   . Hypertension   . Renal disorder   . Single kidney   . Thrombosis     Surgical History: Past Surgical History:  Procedure Laterality Date  . ABDOMINAL HYSTERECTOMY    . blood clots removed from descending aorta     . CHOLECYSTECTOMY    . COLONOSCOPY    . COLONOSCOPY WITH PROPOFOL N/A 04/17/2017   Procedure: COLONOSCOPY WITH PROPOFOL;  Surgeon: Wilford Corner, MD;  Location: [facility] ENDOSCOPY;  Service: Endoscopy;  Laterality: N/A;  .  ESOPHAGOGASTRODUODENOSCOPY (EGD) WITH PROPOFOL N/A 04/17/2017   Procedure: ESOPHAGOGASTRODUODENOSCOPY (EGD) WITH PROPOFOL;  Surgeon: Wilford Corner, MD;  Location: [facility] ENDOSCOPY;  Service: Endoscopy;  Laterality: N/A;  . NEPHRECTOMY RECIPIENT      Family History: Family History  Problem Relation Age of Onset  . Anemia Mother   . Diabetes Father   . Hypertension Brother   . Breast cancer Maternal Aunt        60s    Social History: Social History   Socioeconomic History  . Marital status: Single    Spouse name: Not on file  . Number of children: Not on file  . Years of education: Not on file  . Highest education level: Not on file  Social Needs  . Financial resource strain: Not on file  . Food insecurity - worry: Not on file  . Food insecurity - inability: Not on file  . Transportation needs - medical: Not on file   . Transportation needs - non-medical: Not on file  Occupational History  . Occupation: retired   Tobacco Use  . Smoking status: Current Every Day Smoker    Types: Cigarettes  . Smokeless tobacco: Never Used  Substance and Sexual Activity  . Alcohol use: No  . Drug use: No  . Sexual activity: Not on file  Other Topics Concern  . Not on file  Social History Narrative   Lives with daughter    Dorie Rank from [place]     Allergies: Allergies  Allergen Reactions  . Contrast Media [Iodinated Diagnostic Agents]     Renal hypertension per physician    Medications:  Current Outpatient Medications  Medication Sig Dispense Refill  . amLODipine (NORVASC) 5 MG tablet Take 1 tablet (5 mg total) by mouth daily. 30 tablet 0  . B Complex Vitamins (B COMPLEX PO) Take 1 capsule by mouth daily.    . cholecalciferol (VITAMIN D) 1000 units tablet Take 1 tablet (1,000 Units total) by mouth once a week.    . gabapentin (NEURONTIN) 600 MG tablet Take 600 mg by mouth 3 (three) times daily.     . hydrALAZINE (APRESOLINE) 25 MG tablet Take 25 mg by mouth 3 (three) times daily.    Marland Kitchen HYDROcodone-acetaminophen (NORCO) 10-325 MG tablet Take 1 tablet by mouth every 6 (six) hours as needed for moderate pain.    . Insulin Glargine (TOUJEO SOLOSTAR) 300 UNIT/ML SOPN Inject 54 Units into the skin at bedtime.     . insulin lispro (HUMALOG KWIKPEN) 100 UNIT/ML KiwkPen Inject 22-24 Units into the skin See admin instructions. Inject 24 units with breakfast, 22 units with lunch, and 24 units with dinner    . Ipratropium-Albuterol (COMBIVENT) 20-100 MCG/ACT AERS respimat Inhale 1 puff into the lungs every 6 (six) hours as needed for wheezing or shortness of breath. 1 Inhaler 0  . levothyroxine (SYNTHROID, LEVOTHROID) 25 MCG tablet Take 12.5 mcg by mouth daily before breakfast.    . metoprolol (LOPRESSOR) 50 MG tablet Take 50 mg by mouth 2 (two) times daily.     . Multiple Vitamins-Minerals (EQ MULTIVITAMINS ADULT GUMMY) CHEW  Chew 1 tablet by mouth daily.    . nicotine (NICODERM CQ - DOSED IN MG/24 HOURS) 21 mg/24hr patch Place 1 patch (21 mg total) onto the skin daily. 28 patch 0  . pantoprazole (PROTONIX) 40 MG tablet Take 40 mg by mouth daily.    . polyethylene glycol (MIRALAX / GLYCOLAX) packet Take 17 g by mouth daily. 14 each 0  . Bath  75 MG/ML SOPN Inject 75 mg as directed every 14 (fourteen) days.  4  . tiZANidine (ZANAFLEX) 2 MG tablet Take 2 mg by mouth at bedtime as needed for muscle spasms.      No current facility-administered medications for this visit.     Review of Systems: Review of Systems  Constitutional: Positive for fatigue.  Musculoskeletal: Positive for myalgias.  All other systems reviewed and are negative.    PHYSICAL EXAMINATION There were no vitals taken for this visit.  ECOG PERFORMANCE STATUS: 2 - Symptomatic, <50% confined to bed  Physical Exam  Constitutional: She is oriented to person, place, and time and well-developed, well-nourished, and in no distress. No distress.  HENT:  Head: Normocephalic and atraumatic.  Mouth/Throat: Oropharynx is clear and moist. No oropharyngeal exudate.  Eyes: Conjunctivae and EOM are normal. Pupils are equal, round, and reactive to light. No scleral icterus.  Neck: No thyromegaly present.  Cardiovascular: Normal rate, regular rhythm, normal heart sounds and intact distal pulses. Exam reveals no gallop.  No murmur heard. Pulmonary/Chest: Effort normal and breath sounds normal. No respiratory distress. She has no wheezes. She has no rales. She exhibits no tenderness.  Abdominal: Soft. Bowel sounds are normal. She exhibits no distension and no mass. There is no tenderness. There is no rebound and no guarding.  Musculoskeletal: Normal range of motion. She exhibits no edema.  Lymphadenopathy:    She has no cervical adenopathy.  Neurological: She is alert and oriented to person, place, and time. She has normal reflexes. No cranial nerve  deficit. Coordination normal.  Skin: No rash noted. She is not diaphoretic. No erythema. There is pallor.     LABORATORY DATA: I have personally reviewed the data as listed: Appointment on 06/01/2017  Component Date Value Ref Range Status  . Blood Bank Specimen 06/01/2017 SAMPLE AVAILABLE FOR TESTING   Final  . Sample Expiration 06/01/2017    Final                   Value:06/04/2017 Performed at [facility], [place] [address], [place], [place] [address]   . Hemoglobin 06/01/2017 10.4* 11.6 - 15.9 g/dL Final  . HCT 06/01/2017 35.8  34.8 - 46.6 % Final   Performed at [facility], [place] [address] [place], [place] [address]       Ardath Sax, MD

## 2017-06-20 NOTE — Assessment & Plan Note (Signed)
67 y.o. female with intestinal AVM history with associated chronic GI blood loss resulting in iron deficiency and blood loss anemia. Patient has been managed with parenteral iron supplementation blood transfusions as the AVMs were to locate. History has been complicated by arterial thrombosis event for which patient does not currently receiving therapeutic anticoagulation due to history of bleeding. Shortly after our initial visit in the clinic, patient was admitted to the hospital with exacerbation of her anemia likely due to concurrent gastrointestinal bleeding and possible community-acquired pneumonia.  Patient was treated at the hospital and recovered after 2 units of packed red blood cells transfusion.    Patient recently had repeat endoscopies and no active bleeding is detected.  Hemoglobin is gradually rising.  No need for blood transfusion today.  Plan: --Based on the previous value, patient needs Venofer injection - we will plan on delivering want her next week --Blood support plan:  --If Hgb <= 8.0 (based on patient becoming symptomatic from her anemia) -- transfuse 2 units pRBC and re-check Hgb. If not rising appropriately, admit to the hospital for additional transfusions  --If Hgb 8.1-9.0 -- transfuse 1 unit of pRBC and re-check Hgb

## 2017-06-20 NOTE — Telephone Encounter (Signed)
Gave avs and calendar for February and march °

## 2017-06-21 ENCOUNTER — Other Ambulatory Visit (HOSPITAL_BASED_OUTPATIENT_CLINIC_OR_DEPARTMENT_OTHER): Payer: Self-pay

## 2017-06-21 DIAGNOSIS — R5383 Other fatigue: Secondary | ICD-10-CM

## 2017-06-21 DIAGNOSIS — G4710 Hypersomnia, unspecified: Secondary | ICD-10-CM

## 2017-06-21 DIAGNOSIS — R0683 Snoring: Secondary | ICD-10-CM

## 2017-06-21 LAB — AFP TUMOR MARKER: AFP, Serum, Tumor Marker: 2.9 ng/mL (ref 0.0–8.3)

## 2017-06-21 LAB — CANCER ANTIGEN 19-9: CA 19-9: 18 U/mL (ref 0–35)

## 2017-06-27 ENCOUNTER — Inpatient Hospital Stay: Payer: Medicare Other

## 2017-06-27 VITALS — BP 157/63 | HR 64 | Temp 98.7°F | Resp 18

## 2017-06-27 DIAGNOSIS — D500 Iron deficiency anemia secondary to blood loss (chronic): Secondary | ICD-10-CM

## 2017-06-27 DIAGNOSIS — K5520 Angiodysplasia of colon without hemorrhage: Secondary | ICD-10-CM

## 2017-06-27 DIAGNOSIS — Q2733 Arteriovenous malformation of digestive system vessel: Secondary | ICD-10-CM | POA: Diagnosis not present

## 2017-06-27 MED ORDER — SODIUM CHLORIDE 0.9 % IV SOLN
125.0000 mg | Freq: Once | INTRAVENOUS | Status: AC
  Administered 2017-06-27: 125 mg via INTRAVENOUS
  Filled 2017-06-27: qty 10

## 2017-06-27 MED ORDER — SODIUM CHLORIDE 0.9 % IV SOLN
Freq: Once | INTRAVENOUS | Status: AC
  Administered 2017-06-27: 09:00:00 via INTRAVENOUS

## 2017-06-27 NOTE — Patient Instructions (Signed)
Sodium Ferric Gluconate Complex injection What is this medicine? SODIUM FERRIC GLUCONATE COMPLEX (SOE dee um FER ik GLOO koe nate KOM pleks) is an iron replacement. It is used with epoetin therapy to treat low iron levels in patients who are receiving hemodialysis. This medicine may be used for other purposes; ask your health care provider or pharmacist if you have questions. COMMON BRAND NAME(S): Ferrlecit, Nulecit What should I tell my health care provider before I take this medicine? They need to know if you have any of the following conditions: -anemia that is not from iron deficiency -high levels of iron in the body -an unusual or allergic reaction to iron, benzyl alcohol, other medicines, foods, dyes, or preservatives -pregnant or are trying to become pregnant -breast-feeding How should I use this medicine? This medicine is for infusion into a vein. It is given by a health care professional in a hospital or clinic setting. Talk to your pediatrician regarding the use of this medicine in children. While this drug may be prescribed for children as young as 6 years old for selected conditions, precautions do apply. Overdosage: If you think you have taken too much of this medicine contact a poison control center or emergency room at once. NOTE: This medicine is only for you. Do not share this medicine with others. What if I miss a dose? It is important not to miss your dose. Call your doctor or health care professional if you are unable to keep an appointment. What may interact with this medicine? Do not take this medicine with any of the following medications: -deferoxamine -dimercaprol -other iron products This medicine may also interact with the following medications: -chloramphenicol -deferasirox -medicine for blood pressure like enalapril This list may not describe all possible interactions. Give your health care provider a list of all the medicines, herbs, non-prescription drugs,  or dietary supplements you use. Also tell them if you smoke, drink alcohol, or use illegal drugs. Some items may interact with your medicine. What should I watch for while using this medicine? Your condition will be monitored carefully while you are receiving this medicine. Visit your doctor for check-ups as directed. What side effects may I notice from receiving this medicine? Side effects that you should report to your doctor or health care professional as soon as possible: -allergic reactions like skin rash, itching or hives, swelling of the face, lips, or tongue -breathing problems -changes in hearing -changes in vision -chills, flushing, or sweating -fast, irregular heartbeat -feeling faint or lightheaded, falls -fever, flu-like symptoms -high or low blood pressure -pain, tingling, numbness in the hands or feet -severe pain in the chest, back, flanks, or groin -swelling of the ankles, feet, hands -trouble passing urine or change in the amount of urine -unusually weak or tired Side effects that usually do not require medical attention (report to your doctor or health care professional if they continue or are bothersome): -cramps -dark colored stools -diarrhea -headache -nausea, vomiting -stomach upset This list may not describe all possible side effects. Call your doctor for medical advice about side effects. You may report side effects to FDA at 1-800-FDA-1088. Where should I keep my medicine? This drug is given in a hospital or clinic and will not be stored at home. NOTE: This sheet is a summary. It may not cover all possible information. If you have questions about this medicine, talk to your doctor, pharmacist, or health care provider.  2018 Elsevier/Gold Standard (2008-01-02 15:58:57)  

## 2017-07-03 ENCOUNTER — Ambulatory Visit (HOSPITAL_BASED_OUTPATIENT_CLINIC_OR_DEPARTMENT_OTHER): Payer: Medicare Other

## 2017-07-03 ENCOUNTER — Telehealth: Payer: Self-pay | Admitting: Medical Oncology

## 2017-07-03 NOTE — Telephone Encounter (Signed)
Confirmed appt for tomorrow.

## 2017-07-04 ENCOUNTER — Inpatient Hospital Stay: Payer: Medicare Other

## 2017-07-04 VITALS — BP 152/67 | HR 65 | Temp 98.6°F | Resp 18

## 2017-07-04 DIAGNOSIS — D500 Iron deficiency anemia secondary to blood loss (chronic): Secondary | ICD-10-CM

## 2017-07-04 DIAGNOSIS — Q2733 Arteriovenous malformation of digestive system vessel: Secondary | ICD-10-CM | POA: Diagnosis not present

## 2017-07-04 DIAGNOSIS — K5520 Angiodysplasia of colon without hemorrhage: Secondary | ICD-10-CM

## 2017-07-04 MED ORDER — NA FERRIC GLUC CPLX IN SUCROSE 12.5 MG/ML IV SOLN
125.0000 mg | Freq: Once | INTRAVENOUS | Status: AC
  Administered 2017-07-04: 125 mg via INTRAVENOUS
  Filled 2017-07-04: qty 10

## 2017-07-04 MED ORDER — SODIUM CHLORIDE 0.9 % IV SOLN
Freq: Once | INTRAVENOUS | Status: AC
  Administered 2017-07-04: 13:00:00 via INTRAVENOUS

## 2017-07-04 NOTE — Patient Instructions (Signed)
Sodium Ferric Gluconate Complex injection What is this medicine? SODIUM FERRIC GLUCONATE COMPLEX (SOE dee um FER ik GLOO koe nate KOM pleks) is an iron replacement. It is used with epoetin therapy to treat low iron levels in patients who are receiving hemodialysis. This medicine may be used for other purposes; ask your health care provider or pharmacist if you have questions. COMMON BRAND NAME(S): Ferrlecit, Nulecit What should I tell my health care provider before I take this medicine? They need to know if you have any of the following conditions: -anemia that is not from iron deficiency -high levels of iron in the body -an unusual or allergic reaction to iron, benzyl alcohol, other medicines, foods, dyes, or preservatives -pregnant or are trying to become pregnant -breast-feeding How should I use this medicine? This medicine is for infusion into a vein. It is given by a health care professional in a hospital or clinic setting. Talk to your pediatrician regarding the use of this medicine in children. While this drug may be prescribed for children as young as 6 years old for selected conditions, precautions do apply. Overdosage: If you think you have taken too much of this medicine contact a poison control center or emergency room at once. NOTE: This medicine is only for you. Do not share this medicine with others. What if I miss a dose? It is important not to miss your dose. Call your doctor or health care professional if you are unable to keep an appointment. What may interact with this medicine? Do not take this medicine with any of the following medications: -deferoxamine -dimercaprol -other iron products This medicine may also interact with the following medications: -chloramphenicol -deferasirox -medicine for blood pressure like enalapril This list may not describe all possible interactions. Give your health care provider a list of all the medicines, herbs, non-prescription drugs,  or dietary supplements you use. Also tell them if you smoke, drink alcohol, or use illegal drugs. Some items may interact with your medicine. What should I watch for while using this medicine? Your condition will be monitored carefully while you are receiving this medicine. Visit your doctor for check-ups as directed. What side effects may I notice from receiving this medicine? Side effects that you should report to your doctor or health care professional as soon as possible: -allergic reactions like skin rash, itching or hives, swelling of the face, lips, or tongue -breathing problems -changes in hearing -changes in vision -chills, flushing, or sweating -fast, irregular heartbeat -feeling faint or lightheaded, falls -fever, flu-like symptoms -high or low blood pressure -pain, tingling, numbness in the hands or feet -severe pain in the chest, back, flanks, or groin -swelling of the ankles, feet, hands -trouble passing urine or change in the amount of urine -unusually weak or tired Side effects that usually do not require medical attention (report to your doctor or health care professional if they continue or are bothersome): -cramps -dark colored stools -diarrhea -headache -nausea, vomiting -stomach upset This list may not describe all possible side effects. Call your doctor for medical advice about side effects. You may report side effects to FDA at 1-800-FDA-1088. Where should I keep my medicine? This drug is given in a hospital or clinic and will not be stored at home. NOTE: This sheet is a summary. It may not cover all possible information. If you have questions about this medicine, talk to your doctor, pharmacist, or health care provider.  2018 Elsevier/Gold Standard (2008-01-02 15:58:57)  

## 2017-07-11 ENCOUNTER — Inpatient Hospital Stay: Payer: Medicare Other

## 2017-07-11 VITALS — BP 133/49 | HR 64 | Temp 98.3°F | Resp 18

## 2017-07-11 DIAGNOSIS — K5520 Angiodysplasia of colon without hemorrhage: Secondary | ICD-10-CM

## 2017-07-11 DIAGNOSIS — Q2733 Arteriovenous malformation of digestive system vessel: Secondary | ICD-10-CM | POA: Diagnosis not present

## 2017-07-11 DIAGNOSIS — D500 Iron deficiency anemia secondary to blood loss (chronic): Secondary | ICD-10-CM

## 2017-07-11 MED ORDER — SODIUM CHLORIDE 0.9 % IV SOLN
125.0000 mg | Freq: Once | INTRAVENOUS | Status: AC
  Administered 2017-07-11: 125 mg via INTRAVENOUS
  Filled 2017-07-11: qty 10

## 2017-07-11 MED ORDER — SODIUM CHLORIDE 0.9 % IV SOLN
Freq: Once | INTRAVENOUS | Status: AC
  Administered 2017-07-11: 09:00:00 via INTRAVENOUS

## 2017-07-11 NOTE — Progress Notes (Signed)
PT tolerated iron infusion well and stayed entire 30 min post infusion observation period.  VS stable.  A&Ox4 and ambulatory.

## 2017-07-11 NOTE — Patient Instructions (Signed)
Sodium Ferric Gluconate Complex injection What is this medicine? SODIUM FERRIC GLUCONATE COMPLEX (SOE dee um FER ik GLOO koe nate KOM pleks) is an iron replacement. It is used with epoetin therapy to treat low iron levels in patients who are receiving hemodialysis. This medicine may be used for other purposes; ask your health care provider or pharmacist if you have questions. COMMON BRAND NAME(S): Ferrlecit, Nulecit What should I tell my health care provider before I take this medicine? They need to know if you have any of the following conditions: -anemia that is not from iron deficiency -high levels of iron in the body -an unusual or allergic reaction to iron, benzyl alcohol, other medicines, foods, dyes, or preservatives -pregnant or are trying to become pregnant -breast-feeding How should I use this medicine? This medicine is for infusion into a vein. It is given by a health care professional in a hospital or clinic setting. Talk to your pediatrician regarding the use of this medicine in children. While this drug may be prescribed for children as young as 6 years old for selected conditions, precautions do apply. Overdosage: If you think you have taken too much of this medicine contact a poison control center or emergency room at once. NOTE: This medicine is only for you. Do not share this medicine with others. What if I miss a dose? It is important not to miss your dose. Call your doctor or health care professional if you are unable to keep an appointment. What may interact with this medicine? Do not take this medicine with any of the following medications: -deferoxamine -dimercaprol -other iron products This medicine may also interact with the following medications: -chloramphenicol -deferasirox -medicine for blood pressure like enalapril This list may not describe all possible interactions. Give your health care provider a list of all the medicines, herbs, non-prescription drugs,  or dietary supplements you use. Also tell them if you smoke, drink alcohol, or use illegal drugs. Some items may interact with your medicine. What should I watch for while using this medicine? Your condition will be monitored carefully while you are receiving this medicine. Visit your doctor for check-ups as directed. What side effects may I notice from receiving this medicine? Side effects that you should report to your doctor or health care professional as soon as possible: -allergic reactions like skin rash, itching or hives, swelling of the face, lips, or tongue -breathing problems -changes in hearing -changes in vision -chills, flushing, or sweating -fast, irregular heartbeat -feeling faint or lightheaded, falls -fever, flu-like symptoms -high or low blood pressure -pain, tingling, numbness in the hands or feet -severe pain in the chest, back, flanks, or groin -swelling of the ankles, feet, hands -trouble passing urine or change in the amount of urine -unusually weak or tired Side effects that usually do not require medical attention (report to your doctor or health care professional if they continue or are bothersome): -cramps -dark colored stools -diarrhea -headache -nausea, vomiting -stomach upset This list may not describe all possible side effects. Call your doctor for medical advice about side effects. You may report side effects to FDA at 1-800-FDA-1088. Where should I keep my medicine? This drug is given in a hospital or clinic and will not be stored at home. NOTE: This sheet is a summary. It may not cover all possible information. If you have questions about this medicine, talk to your doctor, pharmacist, or health care provider.  2018 Elsevier/Gold Standard (2008-01-02 15:58:57)  

## 2017-07-14 ENCOUNTER — Inpatient Hospital Stay: Payer: Medicare Other | Attending: Hematology and Oncology

## 2017-07-14 DIAGNOSIS — D500 Iron deficiency anemia secondary to blood loss (chronic): Secondary | ICD-10-CM | POA: Diagnosis present

## 2017-07-14 DIAGNOSIS — E039 Hypothyroidism, unspecified: Secondary | ICD-10-CM | POA: Diagnosis not present

## 2017-07-14 DIAGNOSIS — K5520 Angiodysplasia of colon without hemorrhage: Secondary | ICD-10-CM

## 2017-07-14 DIAGNOSIS — I10 Essential (primary) hypertension: Secondary | ICD-10-CM | POA: Insufficient documentation

## 2017-07-14 DIAGNOSIS — Q2733 Arteriovenous malformation of digestive system vessel: Secondary | ICD-10-CM | POA: Diagnosis present

## 2017-07-14 DIAGNOSIS — E119 Type 2 diabetes mellitus without complications: Secondary | ICD-10-CM | POA: Diagnosis not present

## 2017-07-14 DIAGNOSIS — K219 Gastro-esophageal reflux disease without esophagitis: Secondary | ICD-10-CM | POA: Insufficient documentation

## 2017-07-14 DIAGNOSIS — I2510 Atherosclerotic heart disease of native coronary artery without angina pectoris: Secondary | ICD-10-CM | POA: Insufficient documentation

## 2017-07-14 LAB — CBC WITH DIFFERENTIAL (CANCER CENTER ONLY)
Basophils Absolute: 0.2 10*3/uL — ABNORMAL HIGH (ref 0.0–0.1)
Basophils Relative: 2 %
Eosinophils Absolute: 0.1 10*3/uL (ref 0.0–0.5)
Eosinophils Relative: 2 %
HCT: 31 % — ABNORMAL LOW (ref 34.8–46.6)
Hemoglobin: 9.2 g/dL — ABNORMAL LOW (ref 11.6–15.9)
Lymphocytes Relative: 19 %
Lymphs Abs: 1.6 10*3/uL (ref 0.9–3.3)
MCH: 20.7 pg — ABNORMAL LOW (ref 25.1–34.0)
MCHC: 29.5 g/dL — ABNORMAL LOW (ref 31.5–36.0)
MCV: 70.1 fL — ABNORMAL LOW (ref 79.5–101.0)
Monocytes Absolute: 0.5 10*3/uL (ref 0.1–0.9)
Monocytes Relative: 5 %
Neutro Abs: 6 10*3/uL (ref 1.5–6.5)
Neutrophils Relative %: 72 %
Platelet Count: 274 10*3/uL (ref 145–400)
RBC: 4.43 MIL/uL (ref 3.70–5.45)
RDW: 24.4 % — ABNORMAL HIGH (ref 11.2–14.5)
WBC Count: 8.4 10*3/uL (ref 3.9–10.3)

## 2017-07-14 LAB — IRON AND TIBC
Iron: 31 ug/dL — ABNORMAL LOW (ref 41–142)
Saturation Ratios: 8 % — ABNORMAL LOW (ref 21–57)
TIBC: 371 ug/dL (ref 236–444)
UIBC: 340 ug/dL

## 2017-07-14 LAB — CMP (CANCER CENTER ONLY)
ALT: 27 U/L (ref 0–55)
AST: 20 U/L (ref 5–34)
Albumin: 3.7 g/dL (ref 3.5–5.0)
Alkaline Phosphatase: 150 U/L (ref 40–150)
Anion gap: 10 (ref 3–11)
BUN: 20 mg/dL (ref 7–26)
CO2: 25 mmol/L (ref 22–29)
Calcium: 10.5 mg/dL — ABNORMAL HIGH (ref 8.4–10.4)
Chloride: 106 mmol/L (ref 98–109)
Creatinine: 1.33 mg/dL — ABNORMAL HIGH (ref 0.60–1.10)
GFR, Est AFR Am: 47 mL/min — ABNORMAL LOW (ref >60)
GFR, Estimated: 40 mL/min — ABNORMAL LOW (ref >60)
Glucose, Bld: 148 mg/dL — ABNORMAL HIGH (ref 70–140)
Potassium: 4.1 mmol/L (ref 3.5–5.1)
Sodium: 141 mmol/L (ref 136–145)
Total Bilirubin: 0.2 mg/dL (ref 0.2–1.2)
Total Protein: 7 g/dL (ref 6.4–8.3)

## 2017-07-14 LAB — SAMPLE TO BLOOD BANK

## 2017-07-14 LAB — FERRITIN: Ferritin: 106 ng/mL (ref 9–269)

## 2017-07-16 NOTE — Assessment & Plan Note (Signed)
2 enhancing lesions in the liver based on CT scan which is a suboptimal study to assess there etiology.  Appearance by MRI is consistent with nodular hyperplasia, but continued follow-up is recommended considering metastatic disease is not fully excluded.  Plan: -Repeat MRI of the liver in 2 months.

## 2017-07-16 NOTE — Assessment & Plan Note (Signed)
Patient has previous history of breast mass that was discovered in [place] and reportedly biopsied with benign results.  Our recent imaging demonstrates persistent sizable breast mass with a satellite lesion.  Additional evaluation with imaging demonstrated findings consistent with hamartoma and fibroadenoma respectively.  Both lesions appear to be benign based on imaging.  Plan: -Repeat mammogram in 1 year.

## 2017-07-16 NOTE — Assessment & Plan Note (Signed)
67 y.o. female with intestinal AVM history with associated chronic GI blood loss resulting in iron deficiency and blood loss anemia. Patient has been managed with parenteral iron supplementation blood transfusions as the AVMs were to locate. History has been complicated by arterial thrombosis event for which patient does not currently receiving therapeutic anticoagulation due to history of bleeding. Shortly after our initial visit in the clinic, patient was admitted to the hospital with exacerbation of her anemia likely due to concurrent gastrointestinal bleeding and possible community-acquired pneumonia.  Patient was treated at the hospital and recovered after 2 units of packed red blood cells transfusion.    Patient recently had repeat endoscopies and no active bleeding is detected.  Hemoglobin is gradually rising.  No need for blood transfusion today.  Plan: --Based on the previous value, patient needs Venofer injection - we will plan on delivering 4 treatments 1 week apart. -Repeat lab work 2-3 days prior to return to the clinic. --Blood support plan:  --If Hgb <= 8.0 (based on patient becoming symptomatic from her anemia) -- transfuse 2 units pRBC and re-check Hgb. If not rising appropriately, admit to the hospital for additional transfusions  --If Hgb 8.1-9.0 -- transfuse 1 unit of pRBC and re-check Hgb

## 2017-07-16 NOTE — Progress Notes (Signed)
[facility] Follow-up Visit:  Assessment: Iron deficiency anemia secondary to blood loss (chronic) 67 y.o. female with intestinal AVM history with associated chronic GI blood loss resulting in iron deficiency and blood loss anemia. Patient has been managed with parenteral iron supplementation blood transfusions as the AVMs were to locate. History has been complicated by arterial thrombosis event for which patient does not currently receiving therapeutic anticoagulation due to history of bleeding. Shortly after our initial visit in the clinic, patient was admitted to the hospital with exacerbation of her anemia likely due to concurrent gastrointestinal bleeding and possible community-acquired pneumonia.  Patient was treated at the hospital and recovered after 2 units of packed red blood cells transfusion.    Patient recently had repeat endoscopies and no active bleeding is detected.  Hemoglobin is gradually rising.  No need for blood transfusion today.  Plan: --Based on the previous value, patient needs Venofer injection - we will plan on delivering 4 treatments 1 week apart. -Repeat lab work 2-3 days prior to return to the clinic. --Blood support plan:  --If Hgb <= 8.0 (based on patient becoming symptomatic from her anemia) -- transfuse 2 units pRBC and re-check Hgb. If not rising appropriately, admit to the hospital for additional transfusions  --If Hgb 8.1-9.0 -- transfuse 1 unit of pRBC and re-check Hgb   Breast mass, right Patient has previous history of breast mass that was discovered in [place] and reportedly biopsied with benign results.  Our recent imaging demonstrates persistent sizable breast mass with a satellite lesion.  Additional evaluation with imaging demonstrated findings consistent with hamartoma and fibroadenoma respectively.  Both lesions appear to be benign based on imaging.  Plan: -Repeat mammogram in 1 year.  Liver mass 2 enhancing lesions in the  liver based on CT scan which is a suboptimal study to assess there etiology.  Appearance by MRI is consistent with nodular hyperplasia, but continued follow-up is recommended considering metastatic disease is not fully excluded.  Plan: -Repeat MRI of the liver in 2 months.  Return to clinic in 1 month with clinic visit, and possible intravenous iron administration.  Voice recognition software was used and creation of this note. Despite my best effort at editing the text, some misspelling/errors may have occurred.  Orders Placed This Encounter  Procedures  . CBC with Differential (Cancer Center Only)    Standing Status:   Future    Number of Occurrences:   1    Standing Expiration Date:   06/20/2018  . CMP ([facility] only)    Standing Status:   Future    Number of Occurrences:   1    Standing Expiration Date:   06/20/2018  . Iron and TIBC    Standing Status:   Future    Number of Occurrences:   1    Standing Expiration Date:   06/20/2018  . Ferritin    Standing Status:   Future    Number of Occurrences:   1    Standing Expiration Date:   06/20/2018   All questions were answered.  . The patient knows to call the clinic with any problems, questions or concerns.  This note was electronically signed.    History of Presenting Illness Bethany [name] is a 67 y.o. female followed in the [facility] for assistance in management of chronic anemia due to running blood loss via AVMs in the GI tract. 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 Patient is not on therapeutic anticoagulation due to recurrent gastrointestinal bleeding. Additional past medical history includes insulin-dependent diabetes mellitus, hypertension, GERD, hypothyroidism, coronary artery disease. Patient also was diagnosed with protein C deficiency in the context of arterial clots.  At the present time, patient continues to smoke 0.5 packs per day which she has done over the  past 40 years. She does not consume alcohol. Patient reports low energy, generalized slowing of the activities of daily living, easy fatigability. Also reports cramping in the lower extremities with ambulation. Denies fevers, chills, night sweats. No active chest pain or shortness of breath at rest. No significant abdominal pain, diarrhea, or constipation. No recurrent melena. Patient has been receiving support with parenteral iron supplementation and blood transfusions for management of her chronic anemia.  Returns to the clinic for continued monitoring of her anemia as well as to review new findings of breast and liver masses on imaging.  No interval episodes of melena.  Evaluation of previously noted breast abnormalities were consistent with benign hamartoma and fibroadenoma..  Oncological/hematological History: --Labs, 03/01/17: Hgb   8.8, MCV 73.3, MCH 22.0, Plt 318; tProt 7.1, Alb 4.2, Ca 10.4, Cr 1.2, AP 125; TSH 4.74 --Labs, 03/07/17: Hgb   8.2, MCV 74.4, MCH 21.0, Plt 254; Fe 18, FeSat   4%, TIBC 453, Ferritin 15, Folate 9.3, Vit B12 783 --Admission, 10/29-11/02/18: Discharge diagnoses  --community-acquired pneumonia, COPD exacerbation, anemia due to acute blood loss and chronic anemia due to chronic gastrointestinal bleeding.    --Labs:     Hgb   6.5, MCV 71.6, MCH 20.3, Plt 279;    --Treatment: 2U pRBC, prednisone   --Labs, 04/03/17: Hgb   9.2, MCV 77.2, MCH 23.1, Plt 476; Fe 39, FeSat 11%, TIBC 349, Ferritin 79; AP 210, AST 44, ALT 87 --EGD/Colonoscopy, 04/17/17: Endoscopy demonstrates prepyloric patchy mild inflammatory changes.  No ulcers, no malignancy, no source of bleeding.  Colonoscopy significant for 0.6 cm cecal polyp, diverticulosis coli, grade 1 internal hemorrhoids and nonbleeding telangiectasia. --Labs, 04/19/17: Hgb 10.1, MCV 73.9, MCH 21.9, Plt 288; --CT C/A/P, 05/01/17: 2.8 cm density in the posterior right breast as well as a 1.5 cm additional nodule in the same breast.  1.3  cm enhancing abnormality in the right hepatic lobe as well as a 3.3 cm abnormality in the left lobe of the liver. --Labs, 05/17/17: Hgb   9.9, MCV 70.5, MCH 21.3, Plt 357; Fe 31, FeSat   8%, TIBC 389, Ferritin 29; --MRI Liver, 05/31/17: Focal nodular hyperplasia versus metastatic disease, hepatocellular carcinoma less likely.  Medical History: Past Medical History:  Diagnosis Date  . Acute renal failure ([place])   . Arthritis   . Diabetes mellitus without complication ([place])    type II   . Hypercalcemia   . Hypertension   . Renal disorder   . Single kidney   . Thrombosis     Surgical History: Past Surgical History:  Procedure Laterality Date  . ABDOMINAL HYSTERECTOMY    . blood clots removed from descending aorta     . CHOLECYSTECTOMY    . COLONOSCOPY    . COLONOSCOPY WITH PROPOFOL N/A 04/17/2017   Procedure: COLONOSCOPY WITH PROPOFOL;  Surgeon: Wilford Corner, MD;  Location: [facility];  Service: Endoscopy;  Laterality: N/A;  . ESOPHAGOGASTRODUODENOSCOPY (EGD) WITH PROPOFOL N/A 04/17/2017   Procedure: ESOPHAGOGASTRODUODENOSCOPY (EGD) WITH PROPOFOL;  Surgeon: Wilford Corner, MD;  Location: [facility];  Service: Endoscopy;  Laterality: N/A;  . NEPHRECTOMY RECIPIENT      Family History:  Family History  Problem Relation Age of Onset  . Anemia Mother   . Diabetes Father   . Hypertension Brother   . Breast cancer Maternal Aunt        60s    Social History: Social History   Socioeconomic History  . Marital status: Single    Spouse name: Not on file  . Number of children: Not on file  . Years of education: Not on file  . Highest education level: Not on file  Social Needs  . Financial resource strain: Not on file  . Food insecurity - worry: Not on file  . Food insecurity - inability: Not on file  . Transportation needs - medical: Not on file  . Transportation needs - non-medical: Not on file  Occupational History  . Occupation: retired   Tobacco Use  . Smoking  status: Current Every Day Smoker    Types: Cigarettes  . Smokeless tobacco: Never Used  Substance and Sexual Activity  . Alcohol use: No  . Drug use: No  . Sexual activity: Not on file  Other Topics Concern  . Not on file  Social History Narrative   Lives with daughter    Dorie Rank from [place]     Allergies: Allergies  Allergen Reactions  . Contrast Media [Iodinated Diagnostic Agents]     Renal hypertension per physician    Medications:  Current Outpatient Medications  Medication Sig Dispense Refill  . amLODipine (NORVASC) 5 MG tablet Take 1 tablet (5 mg total) by mouth daily. 30 tablet 0  . B Complex Vitamins (B COMPLEX PO) Take 1 capsule by mouth daily.    . cholecalciferol (VITAMIN D) 1000 units tablet Take 1 tablet (1,000 Units total) by mouth once a week.    . gabapentin (NEURONTIN) 600 MG tablet Take 600 mg by mouth 3 (three) times daily.     . hydrALAZINE (APRESOLINE) 25 MG tablet Take 25 mg by mouth 3 (three) times daily.    Marland Kitchen HYDROcodone-acetaminophen (NORCO) 10-325 MG tablet Take 1 tablet by mouth every 6 (six) hours as needed for moderate pain.    . Insulin Glargine (TOUJEO SOLOSTAR) 300 UNIT/ML SOPN Inject 54 Units into the skin at bedtime.     . insulin lispro (HUMALOG KWIKPEN) 100 UNIT/ML KiwkPen Inject 22-24 Units into the skin See admin instructions. Inject 24 units with breakfast, 22 units with lunch, and 24 units with dinner    . Ipratropium-Albuterol (COMBIVENT) 20-100 MCG/ACT AERS respimat Inhale 1 puff into the lungs every 6 (six) hours as needed for wheezing or shortness of breath. 1 Inhaler 0  . levothyroxine (SYNTHROID, LEVOTHROID) 25 MCG tablet Take 12.5 mcg by mouth daily before breakfast.    . metoprolol (LOPRESSOR) 50 MG tablet Take 50 mg by mouth 2 (two) times daily.     . Multiple Vitamins-Minerals (EQ MULTIVITAMINS ADULT GUMMY) CHEW Chew 1 tablet by mouth daily.    . nicotine (NICODERM CQ - DOSED IN MG/24 HOURS) 21 mg/24hr patch Place 1 patch (21 mg total)  onto the skin daily. 28 patch 0  . pantoprazole (PROTONIX) 40 MG tablet Take 40 mg by mouth daily.    . polyethylene glycol (MIRALAX / GLYCOLAX) packet Take 17 g by mouth daily. 14 each 0  . PRALUENT 75 MG/ML SOPN Inject 75 mg as directed every 14 (fourteen) days.  4  . tiZANidine (ZANAFLEX) 2 MG tablet Take 2 mg by mouth at bedtime as needed for muscle spasms.      No  current facility-administered medications for this visit.     Review of Systems: Review of Systems  Constitutional: Positive for fatigue.  Musculoskeletal: Positive for myalgias.  All other systems reviewed and are negative.    PHYSICAL EXAMINATION Blood pressure (!) 151/56, pulse 66, temperature 98.4 F (36.9 C), temperature source Oral, resp. rate 16, height '5\' 5"'  (1.651 m), weight 194 lb 4.8 oz (88.1 kg), SpO2 98 %.  ECOG PERFORMANCE STATUS: 2 - Symptomatic, <50% confined to bed  Physical Exam  Constitutional: She is oriented to person, place, and time and well-developed, well-nourished, and in no distress. No distress.  HENT:  Head: Normocephalic and atraumatic.  Mouth/Throat: Oropharynx is clear and moist. No oropharyngeal exudate.  Eyes: Conjunctivae and EOM are normal. Pupils are equal, round, and reactive to light. No scleral icterus.  Neck: No thyromegaly present.  Cardiovascular: Normal rate, regular rhythm, normal heart sounds and intact distal pulses. Exam reveals no gallop.  No murmur heard. Pulmonary/Chest: Effort normal and breath sounds normal. No respiratory distress. She has no wheezes. She has no rales. She exhibits no tenderness.  Abdominal: Soft. Bowel sounds are normal. She exhibits no distension and no mass. There is no tenderness. There is no rebound and no guarding.  Musculoskeletal: Normal range of motion. She exhibits no edema.  Lymphadenopathy:    She has no cervical adenopathy.  Neurological: She is alert and oriented to person, place, and time. She has normal reflexes. No cranial nerve  deficit. Coordination normal.  Skin: No rash noted. She is not diaphoretic. No erythema. There is pallor.     LABORATORY DATA: I have personally reviewed the data as listed: Appointment on 06/20/2017  Component Date Value Ref Range Status  . Ferritin 06/20/2017 33  9 - 269 ng/mL Final   Performed at [facility], [place] [address], [place], [place] [address]  . Iron 06/20/2017 21* 41 - 142 ug/dL Final  . TIBC 06/20/2017 376  236 - 444 ug/dL Final  . Saturation Ratios 06/20/2017 6* 21 - 57 % Final  . UIBC 06/20/2017 355  ug/dL Final   Performed at [facility], [place] [address] [place], [place] [address]  . CA 19-9 06/20/2017 18  0 - 35 U/mL Final   Comment: (NOTE) Roche Diagnostics Electrochemiluminescence Immunoassay (ECLIA) Values obtained with different assay methods or kits cannot be used interchangeably.  Results cannot be interpreted as absolute evidence of the presence or absence of malignant disease. Performed At: [facility] [place], [place] [id number] Rush Farmer MD [id number] Performed at [facility], [place] [address] [place], [place] [address]   . AFP, Serum, Tumor Marker 06/20/2017 2.9  0.0 - 8.3 ng/mL Final   Comment: (NOTE) Roche Diagnostics Electrochemiluminescence Immunoassay (ECLIA) Values obtained with different assay methods or kits cannot be used interchangeably.  Results cannot be interpreted as absolute evidence of the presence or absence of malignant disease. This test is not interpretable in pregnant females. Performed At: [facility] [place], [place] [id number] Rush Farmer MD XY:[id number] Performed at [facility], [place] [address], [place], [place] [address]   . Sodium 06/20/2017 140  136 - 145 mmol/L Final  . Potassium 06/20/2017 3.7  3.5 - 5.1 mmol/L Final  . Chloride 06/20/2017 105  98 - 109  mmol/L Final  . CO2 06/20/2017 24  22 - 29 mmol/L Final  . Glucose, Bld 06/20/2017 191* 70 - 140 mg/dL Final  .  BUN 06/20/2017 17  7 - 26 mg/dL Final  . Creatinine 06/20/2017 1.25* 0.60 - 1.10 mg/dL Final  . Calcium 06/20/2017 10.2  8.4 - 10.4 mg/dL Final  . Total Protein 06/20/2017 6.8  6.4 - 8.3 g/dL Final  . Albumin 06/20/2017 3.6  3.5 - 5.0 g/dL Final  . AST 06/20/2017 12  5 - 34 U/L Final  . ALT 06/20/2017 18  0 - 55 U/L Final  . Alkaline Phosphatase 06/20/2017 140  40 - 150 U/L Final  . Total Bilirubin 06/20/2017 <0.2* 0.2 - 1.2 mg/dL Final  . GFR, Est Non Af Am 06/20/2017 43* >60 mL/min Final  . GFR, Est AFR Am 06/20/2017 50* >60 mL/min Final   Comment: (NOTE) The eGFR has been calculated using the CKD EPI equation. This calculation has not been validated in all clinical situations. eGFR's persistently <60 mL/min signify possible Chronic Kidney Disease.   Georgiann Hahn gap 06/20/2017 11  3 - 11 Final   Performed at [facility], [place] [address], [place], [place] [address]  . WBC Count 06/20/2017 6.5  3.9 - 10.3 K/uL Final  . RBC 06/20/2017 4.47  3.70 - 5.45 MIL/uL Final  . Hemoglobin 06/20/2017 9.2* 11.6 - 15.9 g/dL Final  . HCT 06/20/2017 31.0* 34.8 - 46.6 % Final  . MCV 06/20/2017 69.4* 79.5 - 101.0 fL Final  . MCH 06/20/2017 20.7* 25.1 - 34.0 pg Final  . MCHC 06/20/2017 29.8* 31.5 - 36.0 g/dL Final  . RDW 06/20/2017 23.2* 11.2 - 14.5 % Final  . Platelet Count 06/20/2017 395  145 - 400 K/uL Final  . Neutrophils Relative % 06/20/2017 73  % Final  . Neutro Abs 06/20/2017 4.8  1.5 - 6.5 K/uL Final  . Lymphocytes Relative 06/20/2017 15  % Final  . Lymphs Abs 06/20/2017 1.0  0.9 - 3.3 K/uL Final  . Monocytes Relative 06/20/2017 7  % Final  . Monocytes Absolute 06/20/2017 0.5  0.1 - 0.9 K/uL Final  . Eosinophils Relative 06/20/2017 3  % Final  . Eosinophils Absolute 06/20/2017 0.2  0.0 - 0.5 K/uL Final  . Basophils Relative 06/20/2017 2  % Final  .  Basophils Absolute 06/20/2017 0.1  0.0 - 0.1 K/uL Final   Performed at [facility], [place] [address] [place], [place] [address]  . Blood Bank Specimen 06/20/2017 SAMPLE AVAILABLE FOR TESTING   Final  . Sample Expiration 06/20/2017    Final                   Value:06/23/2017 Performed at [facility], [place] [address] [place], [place] [address]        Ardath Sax, MD

## 2017-07-18 ENCOUNTER — Inpatient Hospital Stay (HOSPITAL_BASED_OUTPATIENT_CLINIC_OR_DEPARTMENT_OTHER): Payer: Medicare Other | Admitting: Hematology and Oncology

## 2017-07-18 ENCOUNTER — Telehealth: Payer: Self-pay

## 2017-07-18 ENCOUNTER — Encounter: Payer: Self-pay | Admitting: Hematology and Oncology

## 2017-07-18 ENCOUNTER — Inpatient Hospital Stay: Payer: Medicare Other

## 2017-07-18 VITALS — BP 157/56 | HR 82 | Temp 99.0°F | Resp 20 | Ht 65.0 in | Wt 198.5 lb

## 2017-07-18 VITALS — BP 160/50 | HR 72

## 2017-07-18 DIAGNOSIS — D500 Iron deficiency anemia secondary to blood loss (chronic): Secondary | ICD-10-CM

## 2017-07-18 DIAGNOSIS — R160 Hepatomegaly, not elsewhere classified: Secondary | ICD-10-CM

## 2017-07-18 DIAGNOSIS — K5520 Angiodysplasia of colon without hemorrhage: Secondary | ICD-10-CM

## 2017-07-18 DIAGNOSIS — K219 Gastro-esophageal reflux disease without esophagitis: Secondary | ICD-10-CM

## 2017-07-18 DIAGNOSIS — I10 Essential (primary) hypertension: Secondary | ICD-10-CM | POA: Diagnosis not present

## 2017-07-18 DIAGNOSIS — E039 Hypothyroidism, unspecified: Secondary | ICD-10-CM

## 2017-07-18 DIAGNOSIS — E119 Type 2 diabetes mellitus without complications: Secondary | ICD-10-CM

## 2017-07-18 DIAGNOSIS — I2510 Atherosclerotic heart disease of native coronary artery without angina pectoris: Secondary | ICD-10-CM | POA: Diagnosis not present

## 2017-07-18 DIAGNOSIS — Q2733 Arteriovenous malformation of digestive system vessel: Secondary | ICD-10-CM | POA: Diagnosis not present

## 2017-07-18 MED ORDER — SODIUM CHLORIDE 0.9 % IV SOLN
125.0000 mg | Freq: Once | INTRAVENOUS | Status: AC
  Administered 2017-07-18: 125 mg via INTRAVENOUS
  Filled 2017-07-18: qty 10

## 2017-07-18 MED ORDER — SODIUM CHLORIDE 0.9 % IV SOLN
Freq: Once | INTRAVENOUS | Status: AC
  Administered 2017-07-18: 12:00:00 via INTRAVENOUS

## 2017-07-18 NOTE — Progress Notes (Signed)
Pt stayed 30 min post admin period.  Tolerated well.  VS stable.

## 2017-07-18 NOTE — Patient Instructions (Signed)
Sodium Ferric Gluconate Complex injection What is this medicine? SODIUM FERRIC GLUCONATE COMPLEX (SOE dee um FER ik GLOO koe nate KOM pleks) is an iron replacement. It is used with epoetin therapy to treat low iron levels in patients who are receiving hemodialysis. This medicine may be used for other purposes; ask your health care provider or pharmacist if you have questions. COMMON BRAND NAME(S): Ferrlecit, Nulecit What should I tell my health care provider before I take this medicine? They need to know if you have any of the following conditions: -anemia that is not from iron deficiency -high levels of iron in the body -an unusual or allergic reaction to iron, benzyl alcohol, other medicines, foods, dyes, or preservatives -pregnant or are trying to become pregnant -breast-feeding How should I use this medicine? This medicine is for infusion into a vein. It is given by a health care professional in a hospital or clinic setting. Talk to your pediatrician regarding the use of this medicine in children. While this drug may be prescribed for children as young as 6 years old for selected conditions, precautions do apply. Overdosage: If you think you have taken too much of this medicine contact a poison control center or emergency room at once. NOTE: This medicine is only for you. Do not share this medicine with others. What if I miss a dose? It is important not to miss your dose. Call your doctor or health care professional if you are unable to keep an appointment. What may interact with this medicine? Do not take this medicine with any of the following medications: -deferoxamine -dimercaprol -other iron products This medicine may also interact with the following medications: -chloramphenicol -deferasirox -medicine for blood pressure like enalapril This list may not describe all possible interactions. Give your health care provider a list of all the medicines, herbs, non-prescription drugs,  or dietary supplements you use. Also tell them if you smoke, drink alcohol, or use illegal drugs. Some items may interact with your medicine. What should I watch for while using this medicine? Your condition will be monitored carefully while you are receiving this medicine. Visit your doctor for check-ups as directed. What side effects may I notice from receiving this medicine? Side effects that you should report to your doctor or health care professional as soon as possible: -allergic reactions like skin rash, itching or hives, swelling of the face, lips, or tongue -breathing problems -changes in hearing -changes in vision -chills, flushing, or sweating -fast, irregular heartbeat -feeling faint or lightheaded, falls -fever, flu-like symptoms -high or low blood pressure -pain, tingling, numbness in the hands or feet -severe pain in the chest, back, flanks, or groin -swelling of the ankles, feet, hands -trouble passing urine or change in the amount of urine -unusually weak or tired Side effects that usually do not require medical attention (report to your doctor or health care professional if they continue or are bothersome): -cramps -dark colored stools -diarrhea -headache -nausea, vomiting -stomach upset This list may not describe all possible side effects. Call your doctor for medical advice about side effects. You may report side effects to FDA at 1-800-FDA-1088. Where should I keep my medicine? This drug is given in a hospital or clinic and will not be stored at home. NOTE: This sheet is a summary. It may not cover all possible information. If you have questions about this medicine, talk to your doctor, pharmacist, or health care provider.  2018 Elsevier/Gold Standard (2008-01-02 15:58:57)  

## 2017-07-18 NOTE — Assessment & Plan Note (Signed)
67 y.o. female with intestinal AVM history with associated chronic GI blood loss resulting in iron deficiency and blood loss anemia. Patient has been managed with parenteral iron supplementation blood transfusions as the AVMs were to locate. History has been complicated by arterial thrombosis event for which patient does not currently receiving therapeutic anticoagulation due to history of bleeding. Shortly after our initial visit in the clinic, patient was admitted to the hospital with exacerbation of her anemia likely due to concurrent gastrointestinal bleeding and possible community-acquired pneumonia.  Patient was treated at the hospital and recovered after 2 units of packed red blood cells transfusion.    Patient recently had repeat endoscopies and no active bleeding is detected.  Hemoglobin is stable to slightly lower than the previous visit despite supplemental iron.  Symptomatically, patient reports dramatic improvement in her well-being attributable to the iron infusions.  No need for blood transfusion today.  Plan: --Patient is scheduled to receive her fourth and final loading dose of Venofer today. -Repeat lab work 2-3 days prior to return to the clinic. - Repeat RI of the liver to assess status of the hepatic lesions prior to return to the clinic -Return to clinic in 1 month for continued hematological monitoring. --Blood support plan:  --If Hgb <= 8.0 (based on patient becoming symptomatic from her anemia) -- transfuse 2 units pRBC and re-check Hgb. If not rising appropriately, admit to the hospital for additional transfusions  --If Hgb 8.1-9.0 -- transfuse 1 unit of pRBC and re-check Hgb

## 2017-07-18 NOTE — Progress Notes (Signed)
[facility] Follow-up Visit:  Assessment: Iron deficiency anemia secondary to blood loss (chronic) 67 y.o. female with intestinal AVM history with associated chronic GI blood loss resulting in iron deficiency and blood loss anemia. Patient has been managed with parenteral iron supplementation blood transfusions as the AVMs were to locate. History has been complicated by arterial thrombosis event for which patient does not currently receiving therapeutic anticoagulation due to history of bleeding. Shortly after our initial visit in the clinic, patient was admitted to the hospital with exacerbation of her anemia likely due to concurrent gastrointestinal bleeding and possible community-acquired pneumonia.  Patient was treated at the hospital and recovered after 2 units of packed red blood cells transfusion.    Patient recently had repeat endoscopies and no active bleeding is detected.  Hemoglobin is stable to slightly lower than the previous visit despite supplemental iron.  Symptomatically, patient reports dramatic improvement in her well-being attributable to the iron infusions.  No need for blood transfusion today.  Plan: --Patient is scheduled to receive her fourth and final loading dose of Venofer today. -Repeat lab work 2-3 days prior to return to the clinic. - Repeat RI of the liver to assess status of the hepatic lesions prior to return to the clinic -Return to clinic in 1 month for continued hematological monitoring. --Blood support plan:  --If Hgb <= 8.0 (based on patient becoming symptomatic from her anemia) -- transfuse 2 units pRBC and re-check Hgb. If not rising appropriately, admit to the hospital for additional transfusions  --If Hgb 8.1-9.0 -- transfuse 1 unit of pRBC and re-check Hgb   Voice recognition software was used and creation of this note. Despite my best effort at editing the text, some misspelling/errors may have occurred.  Orders Placed This  Encounter  Procedures  . MR Abdomen W Wo Contrast    Standing Status:   Future    Standing Expiration Date:   09/18/2018    Order Specific Question:   If indicated for the ordered procedure, I authorize the administration of contrast media per Radiology protocol    Answer:   Yes    Order Specific Question:   What is the patient's sedation requirement?    Answer:   No Sedation    Order Specific Question:   Does the patient have a pacemaker or implanted devices?    Answer:   No    Order Specific Question:   Radiology Contrast Protocol - do NOT remove file path    Answer:   [email or web address]    Order Specific Question:   Reason for Exam additional comments    Answer:   Previously noted abnormal enhancement areas on the liver -- please eval interval change    Order Specific Question:   Preferred imaging location?    Answer:   [facility] (table limit-350 lbs)  . CBC with Differential (Cancer Center Only)    Standing Status:   Future    Standing Expiration Date:   07/19/2018  . CMP ([place] only)    Standing Status:   Future    Standing Expiration Date:   07/19/2018  . Iron and TIBC    Standing Status:   Future    Standing Expiration Date:   07/19/2018  . Ferritin    Standing Status:   Future    Standing Expiration Date:   07/19/2018   All questions were answered.  . The patient knows to call the clinic with any problems, questions or concerns.  This note was electronically signed.    History of Presenting Illness Bethany [name] is a 67 y.o. female followed in the [place] for assistance in management of chronic anemia due to running blood loss via AVMs in the GI tract. 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 Patient is not on therapeutic anticoagulation due to recurrent gastrointestinal bleeding. Additional past medical history includes insulin-dependent diabetes mellitus, hypertension, GERD,  hypothyroidism, coronary artery disease. Patient also was diagnosed with protein C deficiency in the context of arterial clots.  At the present time, patient continues to smoke 0.5 packs per day which she has done over the past 40 years. She does not consume alcohol. Patient reports low energy, generalized slowing of the activities of daily living, easy fatigability. Also reports cramping in the lower extremities with ambulation. Denies fevers, chills, night sweats. No active chest pain or shortness of breath at rest. No significant abdominal pain, diarrhea, or constipation. No recurrent melena. Patient has been receiving support with parenteral iron supplementation and blood transfusions for management of her chronic anemia.  Returns to the clinic for continued monitoring of her anemia as well as to review new findings of breast and liver masses on imaging.  No interval episodes of melena.  Patient reports significant symptomatic improvement since initiating parenteral iron.  Reports improved energy, improved activities of daily living, no recurrent melena.  Oncological/hematological History: --Labs, 03/01/17: Hgb   8.8, MCV 73.3, MCH 22.0, Plt 318; tProt 7.1, Alb 4.2, Ca 10.4, Cr 1.2, AP 125; TSH 4.74 --Labs, 03/07/17: Hgb   8.2, MCV 74.4, MCH 21.0, Plt 254; Fe 18, FeSat   4%, TIBC 453, Ferritin 15, Folate 9.3, Vit B12 783 --Admission, 10/29-11/02/18: Discharge diagnoses  --community-acquired pneumonia, COPD exacerbation, anemia due to acute blood loss and chronic anemia due to chronic gastrointestinal bleeding.    --Labs:     Hgb   6.5, MCV 71.6, MCH 20.3, Plt 279;    --Treatment: 2U pRBC, prednisone   --Labs, 04/03/17: Hgb   9.2, MCV 77.2, MCH 23.1,      Plt 476; Fe 39, FeSat 11%, TIBC 349, Ferritin   79; AP 210, AST 44, ALT 87 --EGD/Colonoscopy, 04/17/17: Endoscopy demonstrates prepyloric patchy mild inflammatory changes.  No ulcers, no malignancy, no source of bleeding.  Colonoscopy significant  for 0.6 cm cecal polyp, diverticulosis coli, grade 1 internal hemorrhoids and nonbleeding telangiectasia. --Labs, 04/19/17: Hgb 10.1, MCV 73.9, MCH 21.9, Plt 288; --CT C/A/P, 05/01/17: 2.8 cm density in the posterior right breast as well as a 1.5 cm additional nodule in the same breast.  1.3 cm enhancing abnormality in the right hepatic lobe as well as a 3.3 cm abnormality in the left lobe of the liver. --Labs, 05/17/17: Hgb   9.9, MCV 70.5, MCH 21.3,      Plt 357; Fe 31, FeSat   8%, TIBC 389, Ferritin   29; --MRI Liver, 05/31/17: Focal nodular hyperplasia versus metastatic disease, hepatocellular carcinoma less likely.    --Treatment: Venofer x4 -- 02/02, 02/19, 02/26, 07/18/17 --Labs, 07/14/17: Hgb   9.2, MCV 70.1, MCH 20.7, MCHC 29.5, RDW 24.4, Plt 274; Fe 31, FeSat   8%, TIBC 371, Ferritin 106;  Medical History: Past Medical History:  Diagnosis Date  . Acute renal failure ([place])   . Arthritis   . Diabetes mellitus without complication ([place])    type II   . Hypercalcemia   . Hypertension   . Renal disorder   .  Single kidney   . Thrombosis     Surgical History: Past Surgical History:  Procedure Laterality Date  . ABDOMINAL HYSTERECTOMY    . blood clots removed from descending aorta     . CHOLECYSTECTOMY    . COLONOSCOPY    . COLONOSCOPY WITH PROPOFOL N/A 04/17/2017   Procedure: COLONOSCOPY WITH PROPOFOL;  Surgeon: Wilford Corner, MD;  Location: [facility]COPY;  Service: Endoscopy;  Laterality: N/A;  . ESOPHAGOGASTRODUODENOSCOPY (EGD) WITH PROPOFOL N/A 04/17/2017   Procedure: ESOPHAGOGASTRODUODENOSCOPY (EGD) WITH PROPOFOL;  Surgeon: Wilford Corner, MD;  Location: [facility] END[facility]COPY;  Service: Endoscopy;  Laterality: N/A;  . NEPHRECTOMY RECIPIENT      Family History: Family History  Problem Relation Age of Onset  . Anemia Mother   . Diabetes Father   . Hypertension Brother   . Breast cancer Maternal Aunt        60s    Social History: Social History   Socioeconomic History   . Marital status: Single    Spouse name: Not on file  . Number of children: Not on file  . Years of education: Not on file  . Highest education level: Not on file  Social Needs  . Financial resource strain: Not on file  . Food insecurity - worry: Not on file  . Food insecurity - inability: Not on file  . Transportation needs - medical: Not on file  . Transportation needs - non-medical: Not on file  Occupational History  . Occupation: retired   Tobacco Use  . Smoking status: Current Every Day Smoker    Types: Cigarettes  . Smokeless tobacco: Never Used  Substance and Sexual Activity  . Alcohol use: No  . Drug use: No  . Sexual activity: Not on file  Other Topics Concern  . Not on file  Social History Narrative   Lives with daughter    Dorie Rank from [place]     Allergies: Allergies  Allergen Reactions  . Contrast Media [Iodinated Diagnostic Agents]     Renal hypertension per physician    Medications:  Current Outpatient Medications  Medication Sig Dispense Refill  . amLODipine (NORVASC) 5 MG tablet Take 1 tablet (5 mg total) by mouth daily. 30 tablet 0  . B Complex Vitamins (B COMPLEX PO) Take 1 capsule by mouth daily.    . cholecalciferol (VITAMIN D) 1000 units tablet Take 1 tablet (1,000 Units total) by mouth once a week.    . gabapentin (NEURONTIN) 600 MG tablet Take 600 mg by mouth 3 (three) times daily.     . hydrALAZINE (APRESOLINE) 25 MG tablet Take 25 mg by mouth 3 (three) times daily.    Marland Kitchen HYDROcodone-acetaminophen (NORCO) 10-325 MG tablet Take 1 tablet by mouth every 6 (six) hours as needed for moderate pain.    . Insulin Glargine (TOUJEO SOLOSTAR) 300 UNIT/ML SOPN Inject 54 Units into the skin at bedtime.     . insulin lispro (HUMALOG KWIKPEN) 100 UNIT/ML KiwkPen Inject 22-24 Units into the skin See admin instructions. Inject 24 units with breakfast, 22 units with lunch, and 24 units with dinner    . Ipratropium-Albuterol (COMBIVENT) 20-100 MCG/ACT AERS respimat  Inhale 1 puff into the lungs every 6 (six) hours as needed for wheezing or shortness of breath. 1 Inhaler 0  . levothyroxine (SYNTHROID, LEVOTHROID) 25 MCG tablet Take 12.5 mcg by mouth daily before breakfast.    . metoprolol (LOPRESSOR) 50 MG tablet Take 50 mg by mouth 2 (two) times daily.     Marland Kitchen  Multiple Vitamins-Minerals (EQ MULTIVITAMINS ADULT GUMMY) CHEW Chew 1 tablet by mouth daily.    . nicotine (NICODERM CQ - DOSED IN MG/24 HOURS) 21 mg/24hr patch Place 1 patch (21 mg total) onto the skin daily. 28 patch 0  . pantoprazole (PROTONIX) 40 MG tablet Take 40 mg by mouth daily.    . polyethylene glycol (MIRALAX / GLYCOLAX) packet Take 17 g by mouth daily. 14 each 0  . PRALUENT 75 MG/ML SOPN Inject 75 mg as directed every 14 (fourteen) days.  4  . tiZANidine (ZANAFLEX) 2 MG tablet Take 2 mg by mouth at bedtime as needed for muscle spasms.      No current facility-administered medications for this visit.     Review of Systems: Review of Systems  Constitutional: Positive for fatigue.  Musculoskeletal: Positive for myalgias.  All other systems reviewed and are negative.    PHYSICAL EXAMINATION Blood pressure (!) 157/56, pulse 82, temperature 99 F (37.2 C), temperature source Oral, resp. rate 20, height '5\' 5"'  (1.651 m), weight 198 lb 8 oz (90 kg), SpO2 100 %.  ECOG PERFORMANCE STATUS: 2 - Symptomatic, <50% confined to bed  Physical Exam  Constitutional: She is oriented to person, place, and time and well-developed, well-nourished, and in no distress. No distress.  HENT:  Head: Normocephalic and atraumatic.  Mouth/Throat: Oropharynx is clear and moist. No oropharyngeal exudate.  Eyes: Conjunctivae and EOM are normal. Pupils are equal, round, and reactive to light. No scleral icterus.  Neck: No thyromegaly present.  Cardiovascular: Normal rate, regular rhythm, normal heart sounds and intact distal pulses. Exam reveals no gallop.  No murmur heard. Pulmonary/Chest: Effort normal and  breath sounds normal. No respiratory distress. She has no wheezes. She has no rales. She exhibits no tenderness.  Abdominal: Soft. Bowel sounds are normal. She exhibits no distension and no mass. There is no tenderness. There is no rebound and no guarding.  Musculoskeletal: Normal range of motion. She exhibits no edema.  Lymphadenopathy:    She has no cervical adenopathy.  Neurological: She is alert and oriented to person, place, and time. She has normal reflexes. No cranial nerve deficit. Coordination normal.  Skin: No rash noted. She is not diaphoretic. No erythema. There is pallor.     LABORATORY DATA: I have personally reviewed the data as listed: Appointment on 07/14/2017  Component Date Value Ref Range Status  . Ferritin 07/14/2017 106  9 - 269 ng/mL Final   Performed at [facility], [place] [address] [place], [place] [address]  . Iron 07/14/2017 31* 41 - 142 ug/dL Final  . TIBC 07/14/2017 371  236 - 444 ug/dL Final  . Saturation Ratios 07/14/2017 8* 21 - 57 % Final  . UIBC 07/14/2017 340  ug/dL Final   Performed at [facility], [place] [address], [place], [place] [address]  . Sodium 07/14/2017 141  136 - 145 mmol/L Final  . Potassium 07/14/2017 4.1  3.5 - 5.1 mmol/L Final  . Chloride 07/14/2017 106  98 - 109 mmol/L Final  . CO2 07/14/2017 25  22 - 29 mmol/L Final  . Glucose, Bld 07/14/2017 148* 70 - 140 mg/dL Final  . BUN 07/14/2017 20  7 - 26 mg/dL Final  . Creatinine 07/14/2017 1.33* 0.60 - 1.10 mg/dL Final  . Calcium 07/14/2017 10.5* 8.4 - 10.4 mg/dL Final  . Total Protein 07/14/2017 7.0  6.4 - 8.3 g/dL Final  . Albumin 07/14/2017 3.7  3.5 - 5.0 g/dL Final  .  AST 07/14/2017 20  5 - 34 U/L Final  . ALT 07/14/2017 27  0 - 55 U/L Final  . Alkaline Phosphatase 07/14/2017 150  40 - 150 U/L Final  . Total Bilirubin 07/14/2017 0.2  0.2 - 1.2 mg/dL Final  . GFR, Est Non Af Am 07/14/2017 40* >60 mL/min Final  . GFR, Est AFR Am  07/14/2017 47* >60 mL/min Final   Comment: (NOTE) The eGFR has been calculated using the CKD EPI equation. This calculation has not been validated in all clinical situations. eGFR's persistently <60 mL/min signify possible Chronic Kidney Disease.   Georgiann Hahn gap 07/14/2017 10  3 - 11 Final   Performed at [facility], [place] [address] [place], [place] [address]  . WBC Count 07/14/2017 8.4  3.9 - 10.3 K/uL Final  . RBC 07/14/2017 4.43  3.70 - 5.45 MIL/uL Final  . Hemoglobin 07/14/2017 9.2* 11.6 - 15.9 g/dL Final  . HCT 07/14/2017 31.0* 34.8 - 46.6 % Final  . MCV 07/14/2017 70.1* 79.5 - 101.0 fL Final  . MCH 07/14/2017 20.7* 25.1 - 34.0 pg Final  . MCHC 07/14/2017 29.5* 31.5 - 36.0 g/dL Final  . RDW 07/14/2017 24.4* 11.2 - 14.5 % Final  . Platelet Count 07/14/2017 274  145 - 400 K/uL Final  . Neutrophils Relative % 07/14/2017 72  % Final  . Neutro Abs 07/14/2017 6.0  1.5 - 6.5 K/uL Final  . Lymphocytes Relative 07/14/2017 19  % Final  . Lymphs Abs 07/14/2017 1.6  0.9 - 3.3 K/uL Final  . Monocytes Relative 07/14/2017 5  % Final  . Monocytes Absolute 07/14/2017 0.5  0.1 - 0.9 K/uL Final  . Eosinophils Relative 07/14/2017 2  % Final  . Eosinophils Absolute 07/14/2017 0.1  0.0 - 0.5 K/uL Final  . Basophils Relative 07/14/2017 2  % Final  . Basophils Absolute 07/14/2017 0.2* 0.0 - 0.1 K/uL Final   Performed at [facility], [place] [address], [place], [place] [address]  . Blood Bank Specimen 07/14/2017 SAMPLE AVAILABLE FOR TESTING   Final  . Sample Expiration 07/14/2017    Final                   Value:07/17/2017 Performed at [facility], [place] [address], [place], [place] [address]        Ardath Sax, MD

## 2017-07-18 NOTE — Telephone Encounter (Signed)
Patient made aware of appointment scheduled for MRI on 07/24/17 at 10:00 am. Patient aware to arrive at 9:45 am and nothing to eat or drink 4 hours prior to MRI. Patient verbalized understanding.

## 2017-07-18 NOTE — Telephone Encounter (Signed)
Printed avs and calender of upcoming appointment.  Per 3/5 los 

## 2017-07-24 ENCOUNTER — Ambulatory Visit (HOSPITAL_COMMUNITY)
Admission: RE | Admit: 2017-07-24 | Discharge: 2017-07-24 | Disposition: A | Discharge status: Home / Self Care | Payer: Medicare Other | Source: Ambulatory Visit | Attending: Hematology and Oncology | Admitting: Hematology and Oncology

## 2017-07-24 DIAGNOSIS — R160 Hepatomegaly, not elsewhere classified: Secondary | ICD-10-CM

## 2017-07-24 MED ORDER — GADOBENATE DIMEGLUMINE 529 MG/ML IV SOLN: 20.0000 mL | Freq: Once | INTRAVENOUS | Status: DC | PRN

## 2017-07-27 ENCOUNTER — Ambulatory Visit (HOSPITAL_COMMUNITY)
Admission: RE | Admit: 2017-07-27 | Discharge: 2017-07-27 | Disposition: A | Discharge status: Home / Self Care | Payer: Medicare Other | Source: Ambulatory Visit | Attending: Hematology and Oncology | Admitting: Hematology and Oncology

## 2017-07-27 DIAGNOSIS — R160 Hepatomegaly, not elsewhere classified: Secondary | ICD-10-CM | POA: Insufficient documentation

## 2017-07-27 DIAGNOSIS — D3502 Benign neoplasm of left adrenal gland: Secondary | ICD-10-CM | POA: Insufficient documentation

## 2017-07-27 MED ORDER — GADOXETATE DISODIUM 0.25 MMOL/ML IV SOLN
10.0000 mL | Freq: Once | INTRAVENOUS | Status: AC | PRN
  Administered 2017-07-27: 10 mL via INTRAVENOUS

## 2017-08-11 ENCOUNTER — Inpatient Hospital Stay: Payer: Medicare Other

## 2017-08-11 DIAGNOSIS — K5520 Angiodysplasia of colon without hemorrhage: Secondary | ICD-10-CM

## 2017-08-11 DIAGNOSIS — Q2733 Arteriovenous malformation of digestive system vessel: Secondary | ICD-10-CM | POA: Diagnosis not present

## 2017-08-11 DIAGNOSIS — D500 Iron deficiency anemia secondary to blood loss (chronic): Secondary | ICD-10-CM

## 2017-08-11 LAB — CBC WITH DIFFERENTIAL (CANCER CENTER ONLY)
Basophils Absolute: 0.1 10*3/uL (ref 0.0–0.1)
Basophils Relative: 1 %
Eosinophils Absolute: 0.2 10*3/uL (ref 0.0–0.5)
Eosinophils Relative: 3 %
HCT: 30.1 % — ABNORMAL LOW (ref 34.8–46.6)
Hemoglobin: 8.8 g/dL — ABNORMAL LOW (ref 11.6–15.9)
Lymphocytes Relative: 12 %
Lymphs Abs: 1 10*3/uL (ref 0.9–3.3)
MCH: 20.3 pg — ABNORMAL LOW (ref 25.1–34.0)
MCHC: 29.1 g/dL — ABNORMAL LOW (ref 31.5–36.0)
MCV: 69.8 fL — ABNORMAL LOW (ref 79.5–101.0)
Monocytes Absolute: 0.5 10*3/uL (ref 0.1–0.9)
Monocytes Relative: 6 %
Neutro Abs: 6.4 10*3/uL (ref 1.5–6.5)
Neutrophils Relative %: 78 %
Platelet Count: 229 10*3/uL (ref 145–400)
RBC: 4.31 MIL/uL (ref 3.70–5.45)
RDW: 22.5 % — ABNORMAL HIGH (ref 11.2–14.5)
WBC Count: 8.1 10*3/uL (ref 3.9–10.3)

## 2017-08-11 LAB — CMP (CANCER CENTER ONLY)
ALT: 11 U/L (ref 0–55)
AST: 13 U/L (ref 5–34)
Albumin: 3.4 g/dL — ABNORMAL LOW (ref 3.5–5.0)
Alkaline Phosphatase: 129 U/L (ref 40–150)
Anion gap: 8 (ref 3–11)
BUN: 15 mg/dL (ref 7–26)
CO2: 28 mmol/L (ref 22–29)
Calcium: 10.2 mg/dL (ref 8.4–10.4)
Chloride: 104 mmol/L (ref 98–109)
Creatinine: 1.34 mg/dL — ABNORMAL HIGH (ref 0.60–1.10)
GFR, Est AFR Am: 46 mL/min — ABNORMAL LOW (ref >60)
GFR, Estimated: 40 mL/min — ABNORMAL LOW (ref >60)
Glucose, Bld: 152 mg/dL — ABNORMAL HIGH (ref 70–140)
Potassium: 4.7 mmol/L (ref 3.5–5.1)
Sodium: 140 mmol/L (ref 136–145)
Total Bilirubin: 0.3 mg/dL (ref 0.2–1.2)
Total Protein: 6.7 g/dL (ref 6.4–8.3)

## 2017-08-11 LAB — IRON AND TIBC
Iron: 19 ug/dL — ABNORMAL LOW (ref 41–142)
Saturation Ratios: 5 % — ABNORMAL LOW (ref 21–57)
TIBC: 386 ug/dL (ref 236–444)
UIBC: 367 ug/dL

## 2017-08-11 LAB — FERRITIN: Ferritin: 25 ng/mL (ref 9–269)

## 2017-08-11 LAB — SAMPLE TO BLOOD BANK

## 2017-08-14 ENCOUNTER — Telehealth: Payer: Self-pay | Admitting: Podiatry

## 2017-08-14 ENCOUNTER — Telehealth: Payer: Self-pay | Admitting: *Deleted

## 2017-08-14 NOTE — Telephone Encounter (Signed)
"  I am scheduled to have an appointment tomorrow.  I have a toenail that needs to be removed.  Will I schedule another appointment to have the procedure done?"  If you have a toenail that needs to be removed it can be done the day of your appointment.  "Will I be able to drive?"  Yes, you will be able to drive.  They will only numb up the toe that has the problem.  "Will I be able to feel it when it is taken off?"  Your toe will be numbed up.  "So, I will be able to drive myself after I have it done?"  Yes, you will be able to drive.

## 2017-08-14 NOTE — Telephone Encounter (Signed)
I'm calling to see if I'm having a procedure done tomorrow, Tuesday 02 April at 3:30 pm. I need to know if I need to have a driver. Please give me a call back at [phone]. Thank you.

## 2017-08-14 NOTE — Telephone Encounter (Signed)
Left message informing pt that she would not need a driver after the procedure, but may benefit from a looser fitting shoe to the appt.

## 2017-08-15 ENCOUNTER — Encounter: Payer: Self-pay | Admitting: Hematology and Oncology

## 2017-08-15 ENCOUNTER — Encounter: Payer: Self-pay | Admitting: Podiatry

## 2017-08-15 ENCOUNTER — Ambulatory Visit (INDEPENDENT_AMBULATORY_CARE_PROVIDER_SITE_OTHER): Payer: Medicare Other

## 2017-08-15 ENCOUNTER — Telehealth: Payer: Self-pay | Admitting: Hematology and Oncology

## 2017-08-15 ENCOUNTER — Inpatient Hospital Stay: Payer: Medicare Other

## 2017-08-15 ENCOUNTER — Ambulatory Visit: Payer: Medicare Other | Admitting: Podiatry

## 2017-08-15 ENCOUNTER — Inpatient Hospital Stay: Payer: Medicare Other | Attending: Hematology and Oncology | Admitting: Hematology and Oncology

## 2017-08-15 VITALS — BP 145/57 | HR 73 | Temp 98.0°F | Resp 17

## 2017-08-15 VITALS — BP 140/55 | HR 66 | Temp 99.1°F | Resp 20 | Ht 65.0 in | Wt 195.7 lb

## 2017-08-15 DIAGNOSIS — K5520 Angiodysplasia of colon without hemorrhage: Secondary | ICD-10-CM

## 2017-08-15 DIAGNOSIS — M79674 Pain in right toe(s): Secondary | ICD-10-CM

## 2017-08-15 DIAGNOSIS — Q2733 Arteriovenous malformation of digestive system vessel: Secondary | ICD-10-CM | POA: Diagnosis not present

## 2017-08-15 DIAGNOSIS — R609 Edema, unspecified: Secondary | ICD-10-CM | POA: Diagnosis not present

## 2017-08-15 DIAGNOSIS — B351 Tinea unguium: Secondary | ICD-10-CM

## 2017-08-15 DIAGNOSIS — I10 Essential (primary) hypertension: Secondary | ICD-10-CM | POA: Diagnosis not present

## 2017-08-15 DIAGNOSIS — M79675 Pain in left toe(s): Secondary | ICD-10-CM

## 2017-08-15 DIAGNOSIS — M84374A Stress fracture, right foot, initial encounter for fracture: Secondary | ICD-10-CM

## 2017-08-15 DIAGNOSIS — E039 Hypothyroidism, unspecified: Secondary | ICD-10-CM | POA: Insufficient documentation

## 2017-08-15 DIAGNOSIS — E119 Type 2 diabetes mellitus without complications: Secondary | ICD-10-CM | POA: Diagnosis not present

## 2017-08-15 DIAGNOSIS — D500 Iron deficiency anemia secondary to blood loss (chronic): Secondary | ICD-10-CM | POA: Diagnosis not present

## 2017-08-15 MED ORDER — FERUMOXYTOL INJECTION 510 MG/17 ML
510.0000 mg | Freq: Once | INTRAVENOUS | Status: AC
  Administered 2017-08-15: 510 mg via INTRAVENOUS
  Filled 2017-08-15: qty 17

## 2017-08-15 MED ORDER — SODIUM CHLORIDE 0.9 % IV SOLN
Freq: Once | INTRAVENOUS | Status: AC
  Administered 2017-08-15: 12:00:00 via INTRAVENOUS

## 2017-08-15 NOTE — Telephone Encounter (Signed)
Scheduled appt per 4/2 los - Gave patient AVS and calender per los.  

## 2017-08-15 NOTE — Patient Instructions (Signed)

## 2017-08-16 ENCOUNTER — Telehealth: Payer: Self-pay | Admitting: *Deleted

## 2017-08-16 MED ORDER — CICLOPIROX 8 % EX SOLN: Freq: Every day | CUTANEOUS | 11 refills | Status: DC

## 2017-08-16 NOTE — Telephone Encounter (Signed)
Penlac for the nail fungus. Can you please fax this over?? Thanks.

## 2017-08-16 NOTE — Telephone Encounter (Signed)
Pt states she was to get a cream for her toe and she wanted to make sure it was called to her pharmacy so she could pick it up.

## 2017-08-16 NOTE — Progress Notes (Signed)
Subjective:   Patient ID: Bethany [name], female   DOB: 67 y.o.   MRN: [id number]   HPI Ms. Dwight presents the office today for concerns of her nails becoming thick and discolored most notably her right big toenail.  She says occasionally gets uncomfortable denies any drainage or pus.  Her other concern is that she is been having some swelling to the right foot which is been ongoing for the last couple of weeks and she states that it hurts and burns to the top of her foot and she has numbness into her toes.  She does have neuropathy.  She is currently taking gabapentin for this.  She denies any claudication symptoms.  She has no other concerns today.   Review of Systems  All other systems reviewed and are negative.  Past Medical History:  Diagnosis Date  . Acute renal failure ([place])   . Arthritis   . Diabetes mellitus without complication ([place])    type II   . Hypercalcemia   . Hypertension   . Renal disorder   . Single kidney   . Thrombosis     Past Surgical History:  Procedure Laterality Date  . ABDOMINAL HYSTERECTOMY    . blood clots removed from descending aorta     . CHOLECYSTECTOMY    . COLONOSCOPY    . COLONOSCOPY WITH PROPOFOL N/A 04/17/2017   Procedure: COLONOSCOPY WITH PROPOFOL;  Surgeon: Wilford Corner, MD;  Location: [facility];  Service: Endoscopy;  Laterality: N/A;  . ESOPHAGOGASTRODUODENOSCOPY (EGD) WITH PROPOFOL N/A 04/17/2017   Procedure: ESOPHAGOGASTRODUODENOSCOPY (EGD) WITH PROPOFOL;  Surgeon: Wilford Corner, MD;  Location: [facility];  Service: Endoscopy;  Laterality: N/A;  . NEPHRECTOMY RECIPIENT       Current Outpatient Medications:  .  amLODipine (NORVASC) 5 MG tablet, Take 1 tablet (5 mg total) by mouth daily., Disp: 30 tablet, Rfl: 0 .  cholecalciferol (VITAMIN D) 1000 units tablet, Take 1 tablet (1,000 Units total) by mouth once a week., Disp: , Rfl:  .  Dulaglutide (TRULICITY [place]), Inject into the skin., Disp: , Rfl:  .  gabapentin (NEURONTIN)  600 MG tablet, Take 600 mg by mouth 4 (four) times daily. , Disp: , Rfl:  .  hydrALAZINE (APRESOLINE) 25 MG tablet, Take 25 mg by mouth 3 (three) times daily., Disp: , Rfl:  .  HYDROcodone-acetaminophen (NORCO) 10-325 MG tablet, Take 1 tablet by mouth every 6 (six) hours as needed for moderate pain., Disp: , Rfl:  .  Insulin Glargine (TOUJEO SOLOSTAR) 300 UNIT/ML SOPN, Inject 54 Units into the skin at bedtime. , Disp: , Rfl:  .  insulin lispro (HUMALOG KWIKPEN) 100 UNIT/ML KiwkPen, Inject 22-24 Units into the skin See admin instructions. Inject 24 units with breakfast, 22 units with lunch, and 24 units with dinner, Disp: , Rfl:  .  Ipratropium-Albuterol (COMBIVENT) 20-100 MCG/ACT AERS respimat, Inhale 1 puff into the lungs every 6 (six) hours as needed for wheezing or shortness of breath., Disp: 1 Inhaler, Rfl: 0 .  levothyroxine (SYNTHROID, LEVOTHROID) 25 MCG tablet, Take 12.5 mcg by mouth daily before breakfast., Disp: , Rfl:  .  metoprolol (LOPRESSOR) 50 MG tablet, Take 50 mg by mouth 2 (two) times daily. , Disp: , Rfl:  .  Multiple Vitamins-Minerals (EQ MULTIVITAMINS ADULT GUMMY) CHEW, Chew 1 tablet by mouth daily., Disp: , Rfl:  .  nicotine (NICODERM CQ - DOSED IN MG/24 HOURS) 21 mg/24hr patch, Place 1 patch (21 mg total) onto the skin daily., Disp: 28 patch,  Rfl: 0 .  pantoprazole (PROTONIX) 40 MG tablet, Take 40 mg by mouth daily., Disp: , Rfl:  .  polyethylene glycol (MIRALAX / GLYCOLAX) packet, Take 17 g by mouth daily., Disp: 14 each, Rfl: 0 .  PRALUENT 75 MG/ML SOPN, Inject 75 mg as directed every 14 (fourteen) days., Disp: , Rfl: 4  Allergies  Allergen Reactions  . Contrast Media [Iodinated Diagnostic Agents]     Renal hypertension per physician    Social History   Socioeconomic History  . Marital status: Single    Spouse name: Not on file  . Number of children: Not on file  . Years of education: Not on file  . Highest education level: Not on file  Occupational History  .  Occupation: retired   Scientific laboratory technician  . Financial resource strain: Not on file  . Food insecurity:    Worry: Not on file    Inability: Not on file  . Transportation needs:    Medical: Not on file    Non-medical: Not on file  Tobacco Use  . Smoking status: Current Every Day Smoker    Types: Cigarettes  . Smokeless tobacco: Never Used  Substance and Sexual Activity  . Alcohol use: No  . Drug use: No  . Sexual activity: Not on file  Lifestyle  . Physical activity:    Days per week: Not on file    Minutes per session: Not on file  . Stress: Not on file  Relationships  . Social connections:    Talks on phone: Not on file    Gets together: Not on file    Attends religious service: Not on file    Active member of club or organization: Not on file    Attends meetings of clubs or organizations: Not on file    Relationship status: Not on file  . Intimate partner violence:    Fear of current or ex partner: Not on file    Emotionally abused: Not on file    Physically abused: Not on file    Forced sexual activity: Not on file  Other Topics Concern  . Not on file  Social History Narrative   Lives with daughter    Dorie Rank from [place]          Objective:  Physical Exam  General: AAO x3, NAD  Dermatological: Nails appear to be hypertrophic, dystrophic, discolored with yellow-brown discoloration with some of the bilateral hallux toenails.  The right hallux continues to worse.  Upon debridement there is no drainage or pus.  There is no surrounding erythema, ascending cellulitis.  No fluctuation or crepitation.  No malodor.  No open lesions.  Vascular: Dorsalis Pedis artery and Posterior Tibial artery pedal pulses are 2/4 bilateral with immedate capillary fill time. P There is no pain with calf compression, swelling, warmth, erythema.   Neruologic: Sensation decreased with Derrel Nip monofilament  Musculoskeletal: There is swelling to the right foot there is no erythema or increase in  warmth.  There is tenderness palpation of the third and fourth metatarsal diaphysis significant point tenderness to this area.  There is no other area of tenderness identified.  Muscular strength 5/5 in all groups tested bilateral.  Gait: Unassisted, Nonantalgic.       Assessment:   Onychomycosis bilaterally; concern for stress fracture given pinpoint pain and swelling     Plan:  -Treatment options discussed including all alternatives, risks, and complications -Etiology of symptoms were discussed -X-rays were obtained and reviewed with the  patient.  No definitive evidence of acute fracture identified. -Given the pinpoint pain and swelling concern for possible stress fracture of the right foot.  Because of this I mobilize him in surgical shoe.  Continue elevation. -I discussed the toenail removal.  We also discussed debridement and conservative treatment.  She wishes to go and proceed with conservative debridement.  I was able to debride the nail so that any complications or bleeding.  Discussed treatment options for nail fungus in the range of [place] for fungus discussed success rates of this. -Follow-up in 2 weeks or sooner if needed.  Trula Slade DPM

## 2017-08-16 NOTE — Telephone Encounter (Signed)
I informed pt the nail medication Dr. Jacqualyn Posey was prescribing could be called to the pharmacy of her choice. Pt requested rx to be sent to [facility]. Faxed to [place].

## 2017-08-25 ENCOUNTER — Other Ambulatory Visit: Payer: Self-pay | Admitting: Hematology and Oncology

## 2017-08-25 ENCOUNTER — Inpatient Hospital Stay: Payer: Medicare Other

## 2017-08-25 VITALS — BP 159/58 | HR 80 | Temp 98.2°F | Resp 16

## 2017-08-25 DIAGNOSIS — K5520 Angiodysplasia of colon without hemorrhage: Secondary | ICD-10-CM

## 2017-08-25 DIAGNOSIS — D500 Iron deficiency anemia secondary to blood loss (chronic): Secondary | ICD-10-CM

## 2017-08-25 DIAGNOSIS — Q2733 Arteriovenous malformation of digestive system vessel: Secondary | ICD-10-CM | POA: Diagnosis not present

## 2017-08-25 LAB — CBC WITH DIFFERENTIAL/PLATELET
Basophils Absolute: 0 10*3/uL (ref 0.0–0.1)
Basophils Relative: 0 %
Eosinophils Absolute: 0.3 10*3/uL (ref 0.0–0.5)
Eosinophils Relative: 3 %
HCT: 34.9 % (ref 34.8–46.6)
Hemoglobin: 9.9 g/dL — ABNORMAL LOW (ref 11.6–15.9)
Lymphocytes Relative: 20 %
Lymphs Abs: 1.8 10*3/uL (ref 0.9–3.3)
MCH: 21.8 pg — ABNORMAL LOW (ref 25.1–34.0)
MCHC: 28.4 g/dL — ABNORMAL LOW (ref 31.5–36.0)
MCV: 76.9 fL — ABNORMAL LOW (ref 79.5–101.0)
Monocytes Absolute: 0.5 10*3/uL (ref 0.1–0.9)
Monocytes Relative: 5 %
Neutro Abs: 6.8 10*3/uL — ABNORMAL HIGH (ref 1.5–6.5)
Neutrophils Relative %: 72 %
Platelets: 357 10*3/uL (ref 145–400)
RBC: 4.54 MIL/uL (ref 3.70–5.45)
RDW: 25.3 % — ABNORMAL HIGH (ref 11.2–14.5)
WBC: 9.3 10*3/uL (ref 3.9–10.3)
nRBC: 1 /100 WBC — ABNORMAL HIGH

## 2017-08-25 LAB — SAMPLE TO BLOOD BANK

## 2017-08-25 MED ORDER — SODIUM CHLORIDE 0.9 % IV SOLN
Freq: Once | INTRAVENOUS | Status: AC
  Administered 2017-08-25: 11:00:00 via INTRAVENOUS

## 2017-08-25 MED ORDER — SODIUM CHLORIDE 0.9 % IV SOLN
510.0000 mg | Freq: Once | INTRAVENOUS | Status: AC
  Administered 2017-08-25: 510 mg via INTRAVENOUS
  Filled 2017-08-25: qty 17

## 2017-08-25 NOTE — Patient Instructions (Signed)

## 2017-08-25 NOTE — Progress Notes (Signed)
Attempted IV sticks x 2 unsuccessful.  Catheters d/c intact, sites unremarkable.  Pt voiced no complaints.  Second nurse D. Bell to attempt for IV stick.

## 2017-08-28 NOTE — Progress Notes (Signed)
[facility] Follow-up Visit:  Assessment: Iron deficiency anemia secondary to blood loss (chronic) 67 y.o. female with intestinal AVM history with associated chronic GI blood loss resulting in iron deficiency and blood loss anemia. Patient has been managed with parenteral iron supplementation blood transfusions as the AVMs were to locate. History has been complicated by arterial thrombosis event for which patient does not currently receiving therapeutic anticoagulation due to history of bleeding. Patient recently had repeat endoscopies and no active bleeding is detected.    Patient underwent informed of plan of care between February and March 2019.  Initially, had stabilization of the hematological profile and improvement in the iron values, but in the interim had further drop in hemoglobin and progressive iron depletion consistent with ongoing iron loss with bleeding which now is also contributed to by recurrent epistaxis.  Intensification of iron replacement is likely an order. Additionally, patient was found to have abnormalities of her liver which turned out to be hypervascular hepatic nodules consistent with focal nodular hyperplasia without significant progression over 3 months between 2 MRI studies.  Plan: -Return replacement therapy to [facility] and administer 2 doses of treatment. -Return to clinic in 4 weeks with labs obtained 2-3 days prior and possible IV iron administration -No need to repeat MRI of the liver at this time unless patient develops new laboratory abnormalities or symptoms. --Blood support plan:  --If Hgb <= 8.0 (based on patient becoming symptomatic from her anemia) -- transfuse 2 units pRBC and re-check Hgb. If not rising appropriately, admit to the hospital for additional transfusions  --If Hgb 8.1-9.0 -- transfuse 1 unit of pRBC and re-check Hgb   Voice recognition software was used and creation of this note. Despite my best effort at editing the  text, some misspelling/errors may have occurred.  Orders Placed This Encounter  Procedures  . CBC with Differential (Cancer Center Only)    Standing Status:   Future    Standing Expiration Date:   08/16/2018  . CMP ([facility] only)    Standing Status:   Future    Standing Expiration Date:   08/16/2018  . Iron and TIBC    Standing Status:   Future    Standing Expiration Date:   08/16/2018  . Ferritin    Standing Status:   Future    Standing Expiration Date:   08/16/2018   All questions were answered.  . The patient knows to call the clinic with any problems, questions or concerns.  This note was electronically signed.    History of Presenting Illness Bethany [name] is a 67 y.o. female followed in the [facility] for assistance in management of chronic anemia due to running blood loss via AVMs in the GI tract. 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 Patient is not on therapeutic anticoagulation due to recurrent gastrointestinal bleeding. Additional past medical history includes insulin-dependent diabetes mellitus, hypertension, GERD, hypothyroidism, coronary artery disease. Patient also was diagnosed with protein C deficiency in the context of arterial clots.  At the present time, patient continues to smoke 0.5 packs per day which she has done over the past 40 years. She does not consume alcohol. Patient reports low energy, generalized slowing of the activities of daily living, easy fatigability. Also reports cramping in the lower extremities with ambulation. Denies fevers, chills, night sweats. No active chest pain or shortness of breath at rest. No significant abdominal pain, diarrhea, or constipation. No recurrent melena.  Patient has been receiving support with parenteral iron supplementation and blood transfusions for management of her chronic anemia.  Patient returns to the clinic for continued hematological monitoring.  In the interim,  patient reports increased fatigue, recurrent and frequent self-contained nosebleeds.  She also reports initiating new medication -- dulaglutide (Trulicity) and having developed bandlike pressure-like headaches since the medication was started.  No interval nausea, vomiting, melena, or hematochezia.  Oncological/hematological History: --Labs, 03/01/17: Hgb   8.8, MCV 73.3, MCH 22.0, Plt 318; tProt 7.1, Alb 4.2, Ca 10.4, Cr 1.2, AP 125; TSH 4.74 --Labs, 03/07/17: Hgb   8.2, MCV 74.4, MCH 21.0, Plt 254; Fe 18, FeSat   4%, TIBC 453, Ferritin 15, Folate 9.3, Vit B12 783 --Admission, 10/29-11/02/18: Discharge diagnoses  --community-acquired pneumonia, COPD exacerbation, anemia due to acute blood loss and chronic anemia due to chronic gastrointestinal bleeding.    --Labs:     Hgb   6.5, MCV 71.6, MCH 20.3, Plt 279;    --Treatment: 2U pRBC, prednisone   --Labs, 04/03/17: Hgb   9.2, MCV 77.2, MCH 23.1,      Plt 476; Fe 39, FeSat 11%, TIBC 349, Ferritin   79; AP 210, AST 44, ALT 87 --EGD/Colonoscopy, 04/17/17: Endoscopy demonstrates prepyloric patchy mild inflammatory changes.  No ulcers, no malignancy, no source of bleeding.  Colonoscopy significant for 0.6 cm cecal polyp, diverticulosis coli, grade 1 internal hemorrhoids and nonbleeding telangiectasia. --Labs, 04/19/17: Hgb 10.1, MCV 73.9, MCH 21.9, Plt 288; --CT C/A/P, 05/01/17: 2.8 cm density in the posterior right breast as well as a 1.5 cm additional nodule in the same breast.  1.3 cm enhancing abnormality in the right hepatic lobe as well as a 3.3 cm abnormality in the left lobe of the liver. --Labs, 05/17/17: Hgb   9.9, MCV 70.5, MCH 21.3,      Plt 357; Fe 31, FeSat   8%, TIBC 389, Ferritin   29; --MRI Liver, 05/31/17: Focal nodular hyperplasia versus metastatic disease, hepatocellular carcinoma less likely.    --Treatment: Venofer x4 -- 02/02, 02/19, 02/26, 07/18/17 --Labs, 07/14/17: Hgb   9.2, MCV 70.1, MCH 20.7, MCHC 29.5, RDW 24.4, Plt 274; Fe 31,  FeSat   8%, TIBC 371, Ferritin 106; --MRI Abdomen, 07/27/17: Two small hypervascular hepatic masses remain stable, with characteristics consistent with benign focal nodular hyperplasia --Labs, 08/11/17: Hgb   8.8, MCV 69.8, MCH 20.3, MCHC 29.1, RDW 22.5, Plt 229; Fe 19, FeSat   5%, TIBC 386, Ferritin   25;  Medical History: Past Medical History:  Diagnosis Date  . Acute renal failure ([place])   . Arthritis   . Diabetes mellitus without complication ([place])    type II   . Hypercalcemia   . Hypertension   . Renal disorder   . Single kidney   . Thrombosis     Surgical History: Past Surgical History:  Procedure Laterality Date  . ABDOMINAL HYSTERECTOMY    . blood clots removed from descending aorta     . CHOLECYSTECTOMY    . COLONOSCOPY    . COLONOSCOPY WITH PROPOFOL N/A 04/17/2017   Procedure: COLONOSCOPY WITH PROPOFOL;  Surgeon: Wilford Corner, MD;  [facility];  Service: Endoscopy;  Laterality: N/A;  . ESOPHAGOGASTRODUODENOSCOPY (EGD) WITH PROPOFOL N/A 04/17/2017   Procedure: ESOPHAGOGASTRODUODENOSCOPY (EGD) WITH PROPOFOL;  Surgeon: Wilford Corner, MD;  Location[facility];  Service: Endoscopy;  Laterality: N/A;  . NEPHRECTOMY RECIPIENT      Family History: Family History  Problem Relation Age of Onset  . Anemia  Mother   . Diabetes Father   . Hypertension Brother   . Breast cancer Maternal Aunt        60s    Social History: Social History   Socioeconomic History  . Marital status: Single    Spouse name: Not on file  . Number of children: Not on file  . Years of education: Not on file  . Highest education level: Not on file  Occupational History  . Occupation: retired   Scientific laboratory technician  . Financial resource strain: Not on file  . Food insecurity:    Worry: Not on file    Inability: Not on file  . Transportation needs:    Medical: Not on file    Non-medical: Not on file  Tobacco Use  . Smoking status: Current Every Day Smoker    Types: Cigarettes   . Smokeless tobacco: Never Used  Substance and Sexual Activity  . Alcohol use: No  . Drug use: No  . Sexual activity: Not on file  Lifestyle  . Physical activity:    Days per week: Not on file    Minutes per session: Not on file  . Stress: Not on file  Relationships  . Social connections:    Talks on phone: Not on file    Gets together: Not on file    Attends religious service: Not on file    Active member of club or organization: Not on file    Attends meetings of clubs or organizations: Not on file    Relationship status: Not on file  . Intimate partner violence:    Fear of current or ex partner: Not on file    Emotionally abused: Not on file    Physically abused: Not on file    Forced sexual activity: Not on file  Other Topics Concern  . Not on file  Social History Narrative   Lives with daughter    Dorie Rank from [place]     Allergies: Allergies  Allergen Reactions  . Contrast Media [Iodinated Diagnostic Agents]     Renal hypertension per physician    Medications:  Current Outpatient Medications  Medication Sig Dispense Refill  . Dulaglutide (TRULICITY ) Inject into the skin.    Marland Kitchen amLODipine (NORVASC) 5 MG tablet Take 1 tablet (5 mg total) by mouth daily. 30 tablet 0  . cholecalciferol (VITAMIN D) 1000 units tablet Take 1 tablet (1,000 Units total) by mouth once a week.    . ciclopirox (PENLAC) 8 % solution Apply topically at bedtime. Apply over nail and surrounding skin. Apply daily over previous coat. After seven (7) days, may remove with alcohol and continue cycle. 6.6 mL 11  . gabapentin (NEURONTIN) 600 MG tablet Take 600 mg by mouth 4 (four) times daily.     . hydrALAZINE (APRESOLINE) 25 MG tablet Take 25 mg by mouth 3 (three) times daily.    Marland Kitchen HYDROcodone-acetaminophen (NORCO) 10-325 MG tablet Take 1 tablet by mouth every 6 (six) hours as needed for moderate pain.    . Insulin Glargine (TOUJEO SOLOSTAR) 300 UNIT/ML SOPN Inject 54 Units into the skin at bedtime.      . insulin lispro (HUMALOG KWIKPEN) 100 UNIT/ML KiwkPen Inject 22-24 Units into the skin See admin instructions. Inject 24 units with breakfast, 22 units with lunch, and 24 units with dinner    . Ipratropium-Albuterol (COMBIVENT) 20-100 MCG/ACT AERS respimat Inhale 1 puff into the lungs every 6 (six) hours as needed for wheezing or shortness of breath. 1  Inhaler 0  . levothyroxine (SYNTHROID, LEVOTHROID) 25 MCG tablet Take 12.5 mcg by mouth daily before breakfast.    . metoprolol (LOPRESSOR) 50 MG tablet Take 50 mg by mouth 2 (two) times daily.     . Multiple Vitamins-Minerals (EQ MULTIVITAMINS ADULT GUMMY) CHEW Chew 1 tablet by mouth daily.    . nicotine (NICODERM CQ - DOSED IN MG/24 HOURS) 21 mg/24hr patch Place 1 patch (21 mg total) onto the skin daily. 28 patch 0  . pantoprazole (PROTONIX) 40 MG tablet Take 40 mg by mouth daily.    . polyethylene glycol (MIRALAX / GLYCOLAX) packet Take 17 g by mouth daily. 14 each 0  . PRALUENT 75 MG/ML SOPN Inject 75 mg as directed every 14 (fourteen) days.  4   No current facility-administered medications for this visit.     Review of Systems: Review of Systems  Constitutional: Positive for fatigue.  Musculoskeletal: Positive for myalgias.  All other systems reviewed and are negative.    PHYSICAL EXAMINATION Blood pressure (!) 140/55, pulse 66, temperature 99.1 F (37.3 C), temperature source Oral, resp. rate 20, height '5\' 5"'$  (1.651 m), weight 195 lb 11.2 oz (88.8 kg), SpO2 97 %.  ECOG PERFORMANCE STATUS: 2 - Symptomatic, <50% confined to bed  Physical Exam  Constitutional: She is oriented to person, place, and time and well-developed, well-nourished, and in no distress. No distress.  HENT:  Head: Normocephalic and atraumatic.  Mouth/Throat: Oropharynx is clear and moist. No oropharyngeal exudate.  Eyes: Pupils are equal, round, and reactive to light. Conjunctivae and EOM are normal. No scleral icterus.  Neck: No thyromegaly present.   Cardiovascular: Normal rate, regular rhythm, normal heart sounds and intact distal pulses. Exam reveals no gallop.  No murmur heard. Pulmonary/Chest: Effort normal and breath sounds normal. No respiratory distress. She has no wheezes. She has no rales. She exhibits no tenderness.  Abdominal: Soft. Bowel sounds are normal. She exhibits no distension and no mass. There is no tenderness. There is no rebound and no guarding.  Musculoskeletal: Normal range of motion. She exhibits no edema.  Lymphadenopathy:    She has no cervical adenopathy.  Neurological: She is alert and oriented to person, place, and time. She has normal reflexes. No cranial nerve deficit. Coordination normal.  Skin: No rash noted. She is not diaphoretic. No erythema. There is pallor.     LABORATORY DATA: I have personally reviewed the data as listed: Appointment on 08/11/2017  Component Date Value Ref Range Status  . Ferritin 08/11/2017 25  9 - 269 ng/mL Final   Performed at [facility], [place] [address] [place], [place] [address]  . Iron 08/11/2017 19* 41 - 142 ug/dL Final  . TIBC 08/11/2017 386  236 - 444 ug/dL Final  . Saturation Ratios 08/11/2017 5* 21 - 57 % Final  . UIBC 08/11/2017 367  ug/dL Final   Performed at [facility], [place] [address] [place], [place] [address]  . Sodium 08/11/2017 140  136 - 145 mmol/L Final  . Potassium 08/11/2017 4.7  3.5 - 5.1 mmol/L Final  . Chloride 08/11/2017 104  98 - 109 mmol/L Final  . CO2 08/11/2017 28  22 - 29 mmol/L Final  . Glucose, Bld 08/11/2017 152* 70 - 140 mg/dL Final  . BUN 08/11/2017 15  7 - 26 mg/dL Final  . Creatinine 08/11/2017 1.34* 0.60 - 1.10 mg/dL Final  . Calcium 08/11/2017 10.2  8.4 - 10.4 mg/dL Final  . Total  Protein 08/11/2017 6.7  6.4 - 8.3 g/dL Final  . Albumin 08/11/2017 3.4* 3.5 - 5.0 g/dL Final  . AST 08/11/2017 13  5 - 34 U/L Final  . ALT 08/11/2017 11  0 - 55 U/L Final  . Alkaline Phosphatase  08/11/2017 129  40 - 150 U/L Final  . Total Bilirubin 08/11/2017 0.3  0.2 - 1.2 mg/dL Final  . GFR, Est Non Af Am 08/11/2017 40* >60 mL/min Final  . GFR, Est AFR Am 08/11/2017 46* >60 mL/min Final   Comment: (NOTE) The eGFR has been calculated using the CKD EPI equation. This calculation has not been validated in all clinical situations. eGFR's persistently <60 mL/min signify possible Chronic Kidney Disease.   Georgiann Hahn gap 08/11/2017 8  3 - 11 Final   Performed at [facility], [place] [address], [place], [place] [address]  . WBC Count 08/11/2017 8.1  3.9 - 10.3 K/uL Final  . RBC 08/11/2017 4.31  3.70 - 5.45 MIL/uL Final  . Hemoglobin 08/11/2017 8.8* 11.6 - 15.9 g/dL Final  . HCT 08/11/2017 30.1* 34.8 - 46.6 % Final  . MCV 08/11/2017 69.8* 79.5 - 101.0 fL Final  . MCH 08/11/2017 20.3* 25.1 - 34.0 pg Final  . MCHC 08/11/2017 29.1* 31.5 - 36.0 g/dL Final  . RDW 08/11/2017 22.5* 11.2 - 14.5 % Final  . Platelet Count 08/11/2017 229  145 - 400 K/uL Final  . Neutrophils Relative % 08/11/2017 78  % Final  . Neutro Abs 08/11/2017 6.4  1.5 - 6.5 K/uL Final  . Lymphocytes Relative 08/11/2017 12  % Final  . Lymphs Abs 08/11/2017 1.0  0.9 - 3.3 K/uL Final  . Monocytes Relative 08/11/2017 6  % Final  . Monocytes Absolute 08/11/2017 0.5  0.1 - 0.9 K/uL Final  . Eosinophils Relative 08/11/2017 3  % Final  . Eosinophils Absolute 08/11/2017 0.2  0.0 - 0.5 K/uL Final  . Basophils Relative 08/11/2017 1  % Final  . Basophils Absolute 08/11/2017 0.1  0.0 - 0.1 K/uL Final   Performed at [facility], [place] [address], [place], [place] [address]  . Blood Bank Specimen 08/11/2017 SAMPLE AVAILABLE FOR TESTING   Final  . Sample Expiration 08/11/2017    Final                   Value:08/14/2017 Performed at [facility], [place] [address], [place], [place] [address]        Ardath Sax, MD

## 2017-08-28 NOTE — Assessment & Plan Note (Signed)
67 y.o. female with intestinal AVM history with associated chronic GI blood loss resulting in iron deficiency and blood loss anemia. Patient has been managed with parenteral iron supplementation blood transfusions as the AVMs were to locate. History has been complicated by arterial thrombosis event for which patient does not currently receiving therapeutic anticoagulation due to history of bleeding. Patient recently had repeat endoscopies and no active bleeding is detected.    Patient underwent informed of plan of care between February and March 2019.  Initially, had stabilization of the hematological profile and improvement in the iron values, but in the interim had further drop in hemoglobin and progressive iron depletion consistent with ongoing iron loss with bleeding which now is also contributed to by recurrent epistaxis.  Intensification of iron replacement is likely an order. Additionally, patient was found to have abnormalities of her liver which turned out to be hypervascular hepatic nodules consistent with focal nodular hyperplasia without significant progression over 3 months between 2 MRI studies.  Plan: -Return replacement therapy to [facility] and administer 2 doses of treatment. -Return to clinic in 4 weeks with labs obtained 2-3 days prior and possible IV iron administration -No need to repeat MRI of the liver at this time unless patient develops new laboratory abnormalities or symptoms. --Blood support plan:  --If Hgb <= 8.0 (based on patient becoming symptomatic from her anemia) -- transfuse 2 units pRBC and re-check Hgb. If not rising appropriately, admit to the hospital for additional transfusions  --If Hgb 8.1-9.0 -- transfuse 1 unit of pRBC and re-check Hgb

## 2017-08-29 ENCOUNTER — Encounter: Payer: Self-pay | Admitting: Podiatry

## 2017-08-29 ENCOUNTER — Ambulatory Visit: Payer: Medicare Other | Admitting: Podiatry

## 2017-08-29 ENCOUNTER — Ambulatory Visit (INDEPENDENT_AMBULATORY_CARE_PROVIDER_SITE_OTHER): Payer: Medicare Other

## 2017-08-29 DIAGNOSIS — M84374D Stress fracture, right foot, subsequent encounter for fracture with routine healing: Secondary | ICD-10-CM | POA: Diagnosis not present

## 2017-08-29 DIAGNOSIS — M84374A Stress fracture, right foot, initial encounter for fracture: Secondary | ICD-10-CM

## 2017-08-29 DIAGNOSIS — R609 Edema, unspecified: Secondary | ICD-10-CM

## 2017-08-30 NOTE — Progress Notes (Signed)
Subjective: 67 year old female presents the office today for follow-up evaluation of the right foot pain, stress fracture.  She states that overall she is doing much better.  She has been occasional swelling however this is much improved.  She is having no pain that she reports.  No recent injury or changes since the last saw her.  She has no other concerns. Denies any systemic complaints such as fevers, chills, nausea, vomiting. No acute changes since last appointment, and no other complaints at this time.   Objective: AAO x3, NAD DP/PT pulses palpable bilaterally, CRT less than 3 seconds At this time there is no area pinpoint bony tenderness or pain to vibratory sensation on the right foot.  There is very minimal edema and overall this appears to be much improved.  On clinical exam her symptoms have mostly resolved.  There is no other area of tenderness identified bilaterally.   No open lesions or pre-ulcerative lesions.  No pain with calf compression, swelling, warmth, erythema  Assessment: 67 year old female with improved right foot pain, concern for stress fracture  Plan: -All treatment options discussed with the patient including all alternatives, risks, complications.  -Repeat x-rays were obtained and reviewed.  No definitive evidence of acute fracture or stress fracture identified today.  -Given that her symptoms have resolved and is probably no pain and there is very minimal swelling at this point she can slowly transition back into regular shoe.  Discussed with her that if she has any recurrence of pain she is to go back into the surgical shoe and to let me know.  She agrees with this plan. -Follow-up in 9 weeks for routine care or sooner if any issues are to arise. -Patient encouraged to call the office with any questions, concerns, change in symptoms.   Trula Slade DPM

## 2017-09-01 ENCOUNTER — Other Ambulatory Visit: Payer: Self-pay | Admitting: Hematology and Oncology

## 2017-09-01 ENCOUNTER — Inpatient Hospital Stay: Payer: Medicare Other

## 2017-09-01 VITALS — BP 130/47 | HR 62 | Temp 99.0°F | Resp 16

## 2017-09-01 DIAGNOSIS — K5520 Angiodysplasia of colon without hemorrhage: Secondary | ICD-10-CM

## 2017-09-01 DIAGNOSIS — Q2733 Arteriovenous malformation of digestive system vessel: Secondary | ICD-10-CM | POA: Diagnosis not present

## 2017-09-01 DIAGNOSIS — D500 Iron deficiency anemia secondary to blood loss (chronic): Secondary | ICD-10-CM

## 2017-09-01 LAB — CBC WITH DIFFERENTIAL/PLATELET
Basophils Absolute: 0.1 10*3/uL (ref 0.0–0.1)
Basophils Relative: 2 %
Eosinophils Absolute: 0.2 10*3/uL (ref 0.0–0.5)
Eosinophils Relative: 2 %
HCT: 36.3 % (ref 34.8–46.6)
Hemoglobin: 10.8 g/dL — ABNORMAL LOW (ref 11.6–15.9)
Lymphocytes Relative: 12 %
Lymphs Abs: 0.9 10*3/uL (ref 0.9–3.3)
MCH: 22.6 pg — ABNORMAL LOW (ref 25.1–34.0)
MCHC: 29.7 g/dL — ABNORMAL LOW (ref 31.5–36.0)
MCV: 75.9 fL — ABNORMAL LOW (ref 79.5–101.0)
Monocytes Absolute: 0.5 10*3/uL (ref 0.1–0.9)
Monocytes Relative: 7 %
Neutro Abs: 5.4 10*3/uL (ref 1.5–6.5)
Neutrophils Relative %: 77 %
Platelets: 279 10*3/uL (ref 145–400)
RBC: 4.78 MIL/uL (ref 3.70–5.45)
RDW: 28.8 % — ABNORMAL HIGH (ref 11.2–14.5)
WBC: 7 10*3/uL (ref 3.9–10.3)

## 2017-09-01 LAB — SAMPLE TO BLOOD BANK

## 2017-09-01 MED ORDER — SODIUM CHLORIDE 0.9 % IV SOLN
510.0000 mg | Freq: Once | INTRAVENOUS | Status: AC
  Administered 2017-09-01: 510 mg via INTRAVENOUS
  Filled 2017-09-01: qty 17

## 2017-09-01 MED ORDER — SODIUM CHLORIDE 0.9 % IV SOLN
Freq: Once | INTRAVENOUS | Status: AC
  Administered 2017-09-01: 11:00:00 via INTRAVENOUS

## 2017-09-01 NOTE — Progress Notes (Signed)
Verbal orders from Dr. Lebron Conners to continue iron therapy for 09/01/2017.

## 2017-09-01 NOTE — Patient Instructions (Signed)

## 2017-09-01 NOTE — Progress Notes (Signed)
Patient declined 30 minute post observation for iron infusion.  Patient is no distress upon discharge.

## 2017-09-08 ENCOUNTER — Inpatient Hospital Stay: Payer: Medicare Other

## 2017-09-08 ENCOUNTER — Other Ambulatory Visit: Payer: Self-pay | Admitting: Hematology and Oncology

## 2017-09-08 DIAGNOSIS — D500 Iron deficiency anemia secondary to blood loss (chronic): Secondary | ICD-10-CM

## 2017-09-08 DIAGNOSIS — Q2733 Arteriovenous malformation of digestive system vessel: Secondary | ICD-10-CM | POA: Diagnosis not present

## 2017-09-08 LAB — CMP (CANCER CENTER ONLY)
ALT: 19 U/L (ref 0–55)
AST: 13 U/L (ref 5–34)
Albumin: 3.9 g/dL (ref 3.5–5.0)
Alkaline Phosphatase: 125 U/L (ref 40–150)
Anion gap: 9 (ref 3–11)
BUN: 29 mg/dL — ABNORMAL HIGH (ref 7–26)
CO2: 23 mmol/L (ref 22–29)
Calcium: 11 mg/dL — ABNORMAL HIGH (ref 8.4–10.4)
Chloride: 104 mmol/L (ref 98–109)
Creatinine: 1.39 mg/dL — ABNORMAL HIGH (ref 0.60–1.10)
GFR, Est AFR Am: 44 mL/min — ABNORMAL LOW (ref >60)
GFR, Estimated: 38 mL/min — ABNORMAL LOW (ref >60)
Glucose, Bld: 334 mg/dL — ABNORMAL HIGH (ref 70–140)
Potassium: 4.9 mmol/L (ref 3.5–5.1)
Sodium: 136 mmol/L (ref 136–145)
Total Bilirubin: <0.2 mg/dL — ABNORMAL LOW (ref 0.2–1.2)
Total Protein: 7.3 g/dL (ref 6.4–8.3)

## 2017-09-08 LAB — IRON AND TIBC
Iron: 66 ug/dL (ref 41–142)
Saturation Ratios: 20 % — ABNORMAL LOW (ref 21–57)
TIBC: 334 ug/dL (ref 236–444)
UIBC: 268 ug/dL

## 2017-09-08 LAB — CBC WITH DIFFERENTIAL (CANCER CENTER ONLY)
Basophils Absolute: 0 10*3/uL (ref 0.0–0.1)
Basophils Relative: 0 %
Eosinophils Absolute: 0 10*3/uL (ref 0.0–0.5)
Eosinophils Relative: 0 %
HCT: 36.3 % (ref 34.8–46.6)
Hemoglobin: 10.8 g/dL — ABNORMAL LOW (ref 11.6–15.9)
Lymphocytes Relative: 6 %
Lymphs Abs: 0.7 10*3/uL — ABNORMAL LOW (ref 0.9–3.3)
MCH: 22.9 pg — ABNORMAL LOW (ref 25.1–34.0)
MCHC: 29.7 g/dL — ABNORMAL LOW (ref 31.5–36.0)
MCV: 77.2 fL — ABNORMAL LOW (ref 79.5–101.0)
Monocytes Absolute: 0.3 10*3/uL (ref 0.1–0.9)
Monocytes Relative: 3 %
Neutro Abs: 10.8 10*3/uL — ABNORMAL HIGH (ref 1.5–6.5)
Neutrophils Relative %: 91 %
Platelet Count: 304 10*3/uL (ref 145–400)
RBC: 4.71 MIL/uL (ref 3.70–5.45)
RDW: 26.4 % — ABNORMAL HIGH (ref 11.2–14.5)
WBC Count: 11.8 10*3/uL — ABNORMAL HIGH (ref 3.9–10.3)

## 2017-09-08 LAB — FERRITIN: Ferritin: 482 ng/mL — ABNORMAL HIGH (ref 9–269)

## 2017-09-08 LAB — SAMPLE TO BLOOD BANK

## 2017-09-13 ENCOUNTER — Other Ambulatory Visit: Payer: Medicare Other

## 2017-09-15 ENCOUNTER — Inpatient Hospital Stay: Payer: Medicare Other | Attending: Hematology and Oncology | Admitting: Hematology and Oncology

## 2017-09-15 ENCOUNTER — Telehealth: Payer: Self-pay | Admitting: Hematology and Oncology

## 2017-09-15 ENCOUNTER — Inpatient Hospital Stay: Payer: Medicare Other

## 2017-09-15 VITALS — BP 145/61 | HR 63 | Temp 98.4°F | Resp 17 | Ht 65.0 in | Wt 193.2 lb

## 2017-09-15 DIAGNOSIS — K5520 Angiodysplasia of colon without hemorrhage: Secondary | ICD-10-CM

## 2017-09-15 DIAGNOSIS — Q2733 Arteriovenous malformation of digestive system vessel: Secondary | ICD-10-CM

## 2017-09-15 DIAGNOSIS — D500 Iron deficiency anemia secondary to blood loss (chronic): Secondary | ICD-10-CM | POA: Diagnosis present

## 2017-09-15 NOTE — Telephone Encounter (Signed)
Scheduled appt per 5/3 los - Gave patient AVS and calender per los.  

## 2017-09-22 ENCOUNTER — Ambulatory Visit: Payer: Medicare Other

## 2017-09-22 ENCOUNTER — Other Ambulatory Visit: Payer: Medicare Other

## 2017-09-29 ENCOUNTER — Inpatient Hospital Stay: Payer: Medicare Other

## 2017-09-29 VITALS — BP 130/48 | HR 76 | Temp 98.5°F | Resp 18

## 2017-09-29 DIAGNOSIS — Q2733 Arteriovenous malformation of digestive system vessel: Secondary | ICD-10-CM | POA: Diagnosis not present

## 2017-09-29 DIAGNOSIS — K5520 Angiodysplasia of colon without hemorrhage: Secondary | ICD-10-CM

## 2017-09-29 DIAGNOSIS — D500 Iron deficiency anemia secondary to blood loss (chronic): Secondary | ICD-10-CM

## 2017-09-29 LAB — CBC WITH DIFFERENTIAL (CANCER CENTER ONLY)
Basophils Absolute: 0 10*3/uL (ref 0.0–0.1)
Basophils Relative: 0 %
Eosinophils Absolute: 0.2 10*3/uL (ref 0.0–0.5)
Eosinophils Relative: 3 %
HCT: 37.6 % (ref 34.8–46.6)
Hemoglobin: 11.1 g/dL — ABNORMAL LOW (ref 11.6–15.9)
Lymphocytes Relative: 13 %
Lymphs Abs: 1.2 10*3/uL (ref 0.9–3.3)
MCH: 23.4 pg — ABNORMAL LOW (ref 25.1–34.0)
MCHC: 29.5 g/dL — ABNORMAL LOW (ref 31.5–36.0)
MCV: 79.2 fL — ABNORMAL LOW (ref 79.5–101.0)
Monocytes Absolute: 0.8 10*3/uL (ref 0.1–0.9)
Monocytes Relative: 9 %
Neutro Abs: 7 10*3/uL — ABNORMAL HIGH (ref 1.5–6.5)
Neutrophils Relative %: 75 %
Platelet Count: 302 10*3/uL (ref 145–400)
RBC: 4.75 MIL/uL (ref 3.70–5.45)
RDW: 20.1 % — ABNORMAL HIGH (ref 11.2–14.5)
WBC Count: 9.2 10*3/uL (ref 3.9–10.3)

## 2017-09-29 LAB — SAMPLE TO BLOOD BANK

## 2017-09-29 MED ORDER — SODIUM CHLORIDE 0.9 % IV SOLN
510.0000 mg | Freq: Once | INTRAVENOUS | Status: AC
  Administered 2017-09-29: 510 mg via INTRAVENOUS
  Filled 2017-09-29: qty 17

## 2017-09-29 MED ORDER — SODIUM CHLORIDE 0.9 % IV SOLN
Freq: Once | INTRAVENOUS | Status: AC
  Administered 2017-09-29: 11:00:00 via INTRAVENOUS

## 2017-09-29 NOTE — Progress Notes (Signed)
Pt declined to stay for 35min observation. Pt and vital signs stable upon discharge.

## 2017-09-29 NOTE — Patient Instructions (Signed)

## 2017-10-06 ENCOUNTER — Ambulatory Visit: Payer: Medicare Other

## 2017-10-06 ENCOUNTER — Other Ambulatory Visit: Payer: Medicare Other

## 2017-10-06 NOTE — Progress Notes (Signed)
[facility] Follow-up Visit:  Assessment: Iron deficiency anemia secondary to blood loss (chronic) 67 y.o. female with intestinal AVM history with associated chronic GI blood loss resulting in iron deficiency and blood loss anemia. Patient has been managed with parenteral iron supplementation blood transfusions as the AVMs were to locate. History has been complicated by arterial thrombosis event for which patient does not currently receiving therapeutic anticoagulation due to history of bleeding. Patient recently had repeat endoscopies and no active bleeding is detected.    Patient underwent additional infusions of iron with Feraheme with excellent response based on iron panel and hemoglobin.  Tolerated infusion without significant difficulties.  Plan: -No need for additional Feraheme at this point in time -Continue every other week lab work to monitor for recurrence of anemia -Return to clinic in 1 month with labs obtained 2-3 days prior and possible IV iron administration -No need to repeat MRI of the liver at this time unless patient develops new laboratory abnormalities or symptoms. --Blood support plan:  --If Hgb <= 8.0 (based on patient becoming symptomatic from her anemia) -- transfuse 2 units pRBC and re-check Hgb. If not rising appropriately, admit to the hospital for additional transfusions  --If Hgb 8.1-9.0 -- transfuse 1 unit of pRBC and re-check Hgb   Voice recognition software was used and creation of this note. Despite my best effort at editing the text, some misspelling/errors may have occurred.  Orders Placed This Encounter  Procedures  . CBC with Differential (Cancer Center Only)    Standing Status:   Standing    Number of Occurrences:   8    Standing Expiration Date:   09/16/2018  . CMP ([facility] only)    Standing Status:   Future    Standing Expiration Date:   09/16/2018  . Lactate dehydrogenase (LDH)    Standing Status:   Future    Standing  Expiration Date:   09/15/2018  . Iron and TIBC    Standing Status:   Future    Standing Expiration Date:   09/16/2018  . Ferritin    Standing Status:   Future    Standing Expiration Date:   09/16/2018  . Sample to Blood Bank    Standing Status:   Standing    Number of Occurrences:   8    Standing Expiration Date:   09/16/2018   All questions were answered.  . The patient knows to call the clinic with any problems, questions or concerns.  This note was electronically signed.    History of Presenting Illness Bethany [name] is a 67 y.o. female followed in the [place] for assistance in management of chronic anemia due to running blood loss via AVMs in the GI tract. 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 Patient is not on therapeutic anticoagulation due to recurrent gastrointestinal bleeding. Additional past medical history includes insulin-dependent diabetes mellitus, hypertension, GERD, hypothyroidism, coronary artery disease. Patient also was diagnosed with protein C deficiency in the context of arterial clots.  At the present time, patient continues to smoke 0.5 packs per day which she has done over the past 40 years. She does not consume alcohol. Patient reports low energy, generalized slowing of the activities of daily living, easy fatigability. Also reports cramping in the lower extremities with ambulation. Denies fevers, chills, night sweats. No active chest pain or shortness of breath at rest. No significant abdominal pain, diarrhea, or constipation. No recurrent melena.  Patient has been receiving support with parenteral iron supplementation and blood transfusions for management of her chronic anemia.  Patient returns to the clinic for continued hematological monitoring.  Patient complains of new occipital headaches over the past 3 weeks with gradual progression in intensity.  Otherwise, denies any new symptoms.  No interval nausea,  vomiting, melena, or hematochezia.  In interim, patient was found to have elevated uric acid and was started on allopurinol.  In addition, she started Trulicity.  Oncological/hematological History: --Labs, 03/01/17: Hgb   8.8, MCV 73.3, MCH 22.0, Plt 318; tProt 7.1, Alb 4.2, Ca 10.4, Cr 1.2, AP 125; TSH 4.74 --Labs, 03/07/17: Hgb   8.2, MCV 74.4, MCH 21.0, Plt 254; Fe 18, FeSat   4%, TIBC 453, Ferritin 15, Folate 9.3, Vit B12 783 --Admission, 10/29-11/02/18: Discharge diagnoses  --community-acquired pneumonia, COPD exacerbation, anemia due to acute blood loss and chronic anemia due to chronic gastrointestinal bleeding.    --Labs:     Hgb   6.5, MCV 71.6, MCH 20.3, Plt 279;    --Treatment: 2U pRBC, prednisone   --Labs, 04/03/17: Hgb   9.2, MCV 77.2, MCH 23.1,      Plt 476; Fe 39, FeSat 11%, TIBC 349, Ferritin   79; AP 210, AST 44, ALT 87 --EGD/Colonoscopy, 04/17/17: Endoscopy demonstrates prepyloric patchy mild inflammatory changes.  No ulcers, no malignancy, no source of bleeding.  Colonoscopy significant for 0.6 cm cecal polyp, diverticulosis coli, grade 1 internal hemorrhoids and nonbleeding telangiectasia. --Labs, 04/19/17: Hgb 10.1, MCV 73.9, MCH 21.9, Plt 288; --CT C/A/P, 05/01/17: 2.8 cm density in the posterior right breast as well as a 1.5 cm additional nodule in the same breast.  1.3 cm enhancing abnormality in the right hepatic lobe as well as a 3.3 cm abnormality in the left lobe of the liver. --Labs, 05/17/17: Hgb   9.9, MCV 70.5, MCH 21.3,      Plt 357; Fe 31, FeSat   8%, TIBC 389, Ferritin   29; --MRI Liver, 05/31/17: Focal nodular hyperplasia versus metastatic disease, hepatocellular carcinoma less likely.    --Treatment: Venofer x4 -- 02/02, 02/19, 02/26, 07/18/17 --Labs, 07/14/17: Hgb   9.2, MCV 70.1, MCH 20.7, MCHC 29.5, RDW 24.4, Plt 274; Fe 31, FeSat   8%, TIBC 371, Ferritin 106; --MRI Abdomen, 07/27/17: Two small hypervascular hepatic masses remain stable, with characteristics  consistent with benign focal nodular hyperplasia --Labs, 08/11/17: Hgb   8.8, MCV 69.8, MCH 20.3, MCHC 29.1, RDW 22.5, Plt 229; Fe 19, FeSat   5%, TIBC 386, Ferritin   25;   --Treatment: Ferumoxytol 574m IV, 08/25/17 & 09/01/17 --Labs, 09/08/17: Hgb 10.8, MCV 77.2, MCH 22.9, MCHC 29.7, RDW 26.4, Plt 304; Fe 66, FeSat 20%, TIBC 334, Ferritin 482;  Medical History: Past Medical History:  Diagnosis Date  . [place] renal failure ([facility]   . Arthritis   . Diabetes mellitus without complication ([place]    type II   . Hypercalcemia   . Hypertension   . Renal disorder   . Single kidney   . Thrombosis     Surgical History: Past Surgical History:  Procedure Laterality Date  . ABDOMINAL HYSTERECTOMY    . blood clots removed from descending aorta     . CHOLECYSTECTOMY    . COLONOSCOPY    . COLONOSCOPY WITH PROPOFOL N/A 04/17/2017   Procedure: COLONOSCOPY WITH PROPOFOL;  Surgeon: SWilford Corner MD;  Location: [facility];  Service: Endoscopy;  Laterality: N/A;  . ESOPHAGOGASTRODUODENOSCOPY (EGD) WITH PROPOFOL N/A 04/17/2017  Procedure: ESOPHAGOGASTRODUODENOSCOPY (EGD) WITH PROPOFOL;  Surgeon: Wilford Corner, MD;  Location: [facility];  Service: Endoscopy;  Laterality: N/A;  . NEPHRECTOMY RECIPIENT      Family History: Family History  Problem Relation Age of Onset  . Anemia Mother   . Diabetes Father   . Hypertension Brother   . Breast cancer Maternal Aunt        60s    Social History: Social History   Socioeconomic History  . Marital status: Single    Spouse name: Not on file  . Number of children: Not on file  . Years of education: Not on file  . Highest education level: Not on file  Occupational History  . Occupation: retired   Scientific laboratory technician  . Financial resource strain: Not on file  . Food insecurity:    Worry: Not on file    Inability: Not on file  . Transportation needs:    Medical: Not on file    Non-medical: Not on file  Tobacco Use  . Smoking status:  Current Every Day Smoker    Types: Cigarettes  . Smokeless tobacco: Never Used  Substance and Sexual Activity  . Alcohol use: No  . Drug use: No  . Sexual activity: Not on file  Lifestyle  . Physical activity:    Days per week: Not on file    Minutes per session: Not on file  . Stress: Not on file  Relationships  . Social connections:    Talks on phone: Not on file    Gets together: Not on file    Attends religious service: Not on file    Active member of club or organization: Not on file    Attends meetings of clubs or organizations: Not on file    Relationship status: Not on file  . Intimate partner violence:    Fear of current or ex partner: Not on file    Emotionally abused: Not on file    Physically abused: Not on file    Forced sexual activity: Not on file  Other Topics Concern  . Not on file  Social History Narrative   Lives with daughter    Dorie Rank from [place]     Allergies: Allergies  Allergen Reactions  . Contrast Media [Iodinated Diagnostic Agents]     Renal hypertension per physician    Medications:  Current Outpatient Medications  Medication Sig Dispense Refill  . amLODipine (NORVASC) 5 MG tablet Take 1 tablet (5 mg total) by mouth daily. 30 tablet 0  . cholecalciferol (VITAMIN D) 1000 units tablet Take 1 tablet (1,000 Units total) by mouth once a week.    . ciclopirox (PENLAC) 8 % solution Apply topically at bedtime. Apply over nail and surrounding skin. Apply daily over previous coat. After seven (7) days, may remove with alcohol and continue cycle. 6.6 mL 11  . Dulaglutide (TRULICITY [place]) Inject into the skin.    Marland Kitchen gabapentin (NEURONTIN) 600 MG tablet Take 600 mg by mouth 4 (four) times daily.     . hydrALAZINE (APRESOLINE) 25 MG tablet Take 25 mg by mouth 3 (three) times daily.    Marland Kitchen HYDROcodone-acetaminophen (NORCO) 10-325 MG tablet Take 1 tablet by mouth every 6 (six) hours as needed for moderate pain.    . Insulin Glargine (TOUJEO SOLOSTAR) 300 UNIT/ML  SOPN Inject 54 Units into the skin at bedtime.     . insulin lispro (HUMALOG KWIKPEN) 100 UNIT/ML KiwkPen Inject 22-24 Units into the skin See admin instructions. Inject  24 units with breakfast, 22 units with lunch, and 24 units with dinner    . Ipratropium-Albuterol (COMBIVENT) 20-100 MCG/ACT AERS respimat Inhale 1 puff into the lungs every 6 (six) hours as needed for wheezing or shortness of breath. 1 Inhaler 0  . levothyroxine (SYNTHROID, LEVOTHROID) 25 MCG tablet Take 12.5 mcg by mouth daily before breakfast.    . metoprolol (LOPRESSOR) 50 MG tablet Take 50 mg by mouth 2 (two) times daily.     . Multiple Vitamins-Minerals (EQ MULTIVITAMINS ADULT GUMMY) CHEW Chew 1 tablet by mouth daily.    . nicotine (NICODERM CQ - DOSED IN MG/24 HOURS) 21 mg/24hr patch Place 1 patch (21 mg total) onto the skin daily. 28 patch 0  . pantoprazole (PROTONIX) 40 MG tablet Take 40 mg by mouth daily.    . polyethylene glycol (MIRALAX / GLYCOLAX) packet Take 17 g by mouth daily. 14 each 0  . PRALUENT 75 MG/ML SOPN Inject 75 mg as directed every 14 (fourteen) days.  4   No current facility-administered medications for this visit.     Review of Systems: Review of Systems  Constitutional: Positive for fatigue.  Musculoskeletal: Positive for myalgias.  All other systems reviewed and are negative.    PHYSICAL EXAMINATION Blood pressure (!) 145/61, pulse 63, temperature 98.4 F (36.9 C), temperature source Oral, resp. rate 17, height _0  (1.651 m), weight 193 lb 3.2 oz (87.6 kg), SpO2 99 %.  ECOG PERFORMANCE STATUS: 2 - Symptomatic, <50% confined to bed  Physical Exam  Constitutional: She is oriented to person, place, and time and well-developed, well-nourished, and in no distress. No distress.  HENT:  Head: Normocephalic and atraumatic.  Mouth/Throat: Oropharynx is clear and moist. No oropharyngeal exudate.  Eyes: Pupils are equal, round, and reactive to light. Conjunctivae and EOM are normal. No scleral  icterus.  Neck: No thyromegaly present.  Cardiovascular: Normal rate, regular rhythm, normal heart sounds and intact distal pulses. Exam reveals no gallop.  No murmur heard. Pulmonary/Chest: Effort normal and breath sounds normal. No respiratory distress. She has no wheezes. She has no rales. She exhibits no tenderness.  Abdominal: Soft. Bowel sounds are normal. She exhibits no distension and no mass. There is no tenderness. There is no rebound and no guarding.  Musculoskeletal: Normal range of motion. She exhibits no edema.  Lymphadenopathy:    She has no cervical adenopathy.  Neurological: She is alert and oriented to person, place, and time. She has normal reflexes. No cranial nerve deficit. Coordination normal.  Skin: No rash noted. She is not diaphoretic. No erythema. There is pallor.     LABORATORY DATA: I have personally reviewed the data as listed: No visits with results within 1 Week(s) from this visit.  Latest known visit with results is:  Appointment on 09/08/2017  Component Date Value Ref Range Status  . Ferritin 09/08/2017 482* 9 - 269 ng/mL Final   Performed at [facility], [place] [address], [place], [place] [address]  . Iron 09/08/2017 66  41 - 142 ug/dL Final  . TIBC 09/08/2017 334  236 - 444 ug/dL Final  . Saturation Ratios 09/08/2017 20* 21 - 57 % Final  . UIBC 09/08/2017 268  ug/dL Final   Performed at [facility], [place] [address] [place], [place] [address]  . Sodium 09/08/2017 136  136 - 145 mmol/L Final  . Potassium 09/08/2017 4.9  3.5 - 5.1 mmol/L Final  . Chloride 09/08/2017 104  98 -  109 mmol/L Final  . CO2 09/08/2017 23  22 - 29 mmol/L Final  . Glucose, Bld 09/08/2017 334* 70 - 140 mg/dL Final  . BUN 09/08/2017 29* 7 - 26 mg/dL Final  . Creatinine 09/08/2017 1.39* 0.60 - 1.10 mg/dL Final  . Calcium 09/08/2017 11.0* 8.4 - 10.4 mg/dL Final  . Total Protein 09/08/2017 7.3  6.4 - 8.3 g/dL Final  . Albumin  09/08/2017 3.9  3.5 - 5.0 g/dL Final  . AST 09/08/2017 13  5 - 34 U/L Final  . ALT 09/08/2017 19  0 - 55 U/L Final  . Alkaline Phosphatase 09/08/2017 125  40 - 150 U/L Final  . Total Bilirubin 09/08/2017 <0.2* 0.2 - 1.2 mg/dL Final  . GFR, Est Non Af Am 09/08/2017 38* >60 mL/min Final  . GFR, Est AFR Am 09/08/2017 44* >60 mL/min Final   Comment: (NOTE) The eGFR has been calculated using the CKD EPI equation. This calculation has not been validated in all clinical situations. eGFR's persistently <60 mL/min signify possible Chronic Kidney Disease.   Georgiann Hahn gap 09/08/2017 9  3 - 11 Final   Performed at [facility], [place] [address], [place], [place] [address]  . WBC Count 09/08/2017 11.8* 3.9 - 10.3 K/uL Final  . RBC 09/08/2017 4.71  3.70 - 5.45 MIL/uL Final  . Hemoglobin 09/08/2017 10.8* 11.6 - 15.9 g/dL Final  . HCT 09/08/2017 36.3  34.8 - 46.6 % Final  . MCV 09/08/2017 77.2* 79.5 - 101.0 fL Final  . MCH 09/08/2017 22.9* 25.1 - 34.0 pg Final  . MCHC 09/08/2017 29.7* 31.5 - 36.0 g/dL Final  . RDW 09/08/2017 26.4* 11.2 - 14.5 % Final  . Platelet Count 09/08/2017 304  145 - 400 K/uL Final  . Neutrophils Relative % 09/08/2017 91  % Final  . Neutro Abs 09/08/2017 10.8* 1.5 - 6.5 K/uL Final  . Lymphocytes Relative 09/08/2017 6  % Final  . Lymphs Abs 09/08/2017 0.7* 0.9 - 3.3 K/uL Final  . Monocytes Relative 09/08/2017 3  % Final  . Monocytes Absolute 09/08/2017 0.3  0.1 - 0.9 K/uL Final  . Eosinophils Relative 09/08/2017 0  % Final  . Eosinophils Absolute 09/08/2017 0.0  0.0 - 0.5 K/uL Final  . Basophils Relative 09/08/2017 0  % Final  . Basophils Absolute 09/08/2017 0.0  0.0 - 0.1 K/uL Final   Performed at [facility], [place] [address], [place], [place] [address]  . Blood Bank Specimen 09/08/2017 SAMPLE AVAILABLE FOR TESTING   Final  . Sample Expiration 09/08/2017    Final                   Value:09/11/2017 Performed at [facility], [place] [address] [place], [place] [address]        Ardath Sax, MD

## 2017-10-06 NOTE — Assessment & Plan Note (Signed)
67 y.o. female with intestinal AVM history with associated chronic GI blood loss resulting in iron deficiency and blood loss anemia. Patient has been managed with parenteral iron supplementation blood transfusions as the AVMs were to locate. History has been complicated by arterial thrombosis event for which patient does not currently receiving therapeutic anticoagulation due to history of bleeding. Patient recently had repeat endoscopies and no active bleeding is detected.    Patient underwent additional infusions of iron with Feraheme with excellent response based on iron panel and hemoglobin.  Tolerated infusion without significant difficulties.  Plan: -No need for additional Feraheme at this point in time -Continue every other week lab work to monitor for recurrence of anemia -Return to clinic in 1 month with labs obtained 2-3 days prior and possible IV iron administration -No need to repeat MRI of the liver at this time unless patient develops new laboratory abnormalities or symptoms. --Blood support plan:  --If Hgb <= 8.0 (based on patient becoming symptomatic from her anemia) -- transfuse 2 units pRBC and re-check Hgb. If not rising appropriately, admit to the hospital for additional transfusions  --If Hgb 8.1-9.0 -- transfuse 1 unit of pRBC and re-check Hgb

## 2017-10-13 ENCOUNTER — Encounter: Payer: Self-pay | Admitting: Hematology and Oncology

## 2017-10-13 ENCOUNTER — Inpatient Hospital Stay: Payer: Medicare Other

## 2017-10-13 ENCOUNTER — Inpatient Hospital Stay (HOSPITAL_BASED_OUTPATIENT_CLINIC_OR_DEPARTMENT_OTHER): Payer: Medicare Other | Admitting: Hematology and Oncology

## 2017-10-13 ENCOUNTER — Telehealth: Payer: Self-pay | Admitting: Hematology and Oncology

## 2017-10-13 VITALS — BP 146/57 | HR 74 | Temp 98.5°F | Resp 18 | Ht 65.0 in | Wt 194.7 lb

## 2017-10-13 DIAGNOSIS — Q2733 Arteriovenous malformation of digestive system vessel: Secondary | ICD-10-CM | POA: Diagnosis not present

## 2017-10-13 DIAGNOSIS — D500 Iron deficiency anemia secondary to blood loss (chronic): Secondary | ICD-10-CM

## 2017-10-13 DIAGNOSIS — K5520 Angiodysplasia of colon without hemorrhage: Secondary | ICD-10-CM

## 2017-10-13 LAB — CBC WITH DIFFERENTIAL (CANCER CENTER ONLY)
Basophils Absolute: 0 10*3/uL (ref 0.0–0.1)
Basophils Relative: 0 %
Eosinophils Absolute: 0.2 10*3/uL (ref 0.0–0.5)
Eosinophils Relative: 3 %
HCT: 42.3 % (ref 34.8–46.6)
Hemoglobin: 12.7 g/dL (ref 11.6–15.9)
Lymphocytes Relative: 20 %
Lymphs Abs: 1.3 10*3/uL (ref 0.9–3.3)
MCH: 24.2 pg — ABNORMAL LOW (ref 25.1–34.0)
MCHC: 30 g/dL — ABNORMAL LOW (ref 31.5–36.0)
MCV: 80.7 fL (ref 79.5–101.0)
Monocytes Absolute: 0.3 10*3/uL (ref 0.1–0.9)
Monocytes Relative: 5 %
Neutro Abs: 5 10*3/uL (ref 1.5–6.5)
Neutrophils Relative %: 72 %
Platelet Count: 258 10*3/uL (ref 145–400)
RBC: 5.24 MIL/uL (ref 3.70–5.45)
RDW: 19.8 % — ABNORMAL HIGH (ref 11.2–14.5)
WBC Count: 6.8 10*3/uL (ref 3.9–10.3)

## 2017-10-13 LAB — CMP (CANCER CENTER ONLY)
ALT: 69 U/L — ABNORMAL HIGH (ref 0–55)
AST: 54 U/L — ABNORMAL HIGH (ref 5–34)
Albumin: 4.1 g/dL (ref 3.5–5.0)
Alkaline Phosphatase: 158 U/L — ABNORMAL HIGH (ref 40–150)
Anion gap: 8 (ref 3–11)
BUN: 23 mg/dL (ref 7–26)
CO2: 26 mmol/L (ref 22–29)
Calcium: 10.7 mg/dL — ABNORMAL HIGH (ref 8.4–10.4)
Chloride: 103 mmol/L (ref 98–109)
Creatinine: 1.35 mg/dL — ABNORMAL HIGH (ref 0.60–1.10)
GFR, Est AFR Am: 46 mL/min — ABNORMAL LOW (ref >60)
GFR, Estimated: 40 mL/min — ABNORMAL LOW (ref >60)
Glucose, Bld: 143 mg/dL — ABNORMAL HIGH (ref 70–140)
Potassium: 4.6 mmol/L (ref 3.5–5.1)
Sodium: 137 mmol/L (ref 136–145)
Total Bilirubin: 0.3 mg/dL (ref 0.2–1.2)
Total Protein: 7.5 g/dL (ref 6.4–8.3)

## 2017-10-13 LAB — SAMPLE TO BLOOD BANK

## 2017-10-13 LAB — IRON AND TIBC
Iron: 35 ug/dL — ABNORMAL LOW (ref 41–142)
Saturation Ratios: 10 % — ABNORMAL LOW (ref 21–57)
TIBC: 338 ug/dL (ref 236–444)
UIBC: 303 ug/dL

## 2017-10-13 LAB — FERRITIN: Ferritin: 250 ng/mL (ref 9–269)

## 2017-10-13 LAB — LACTATE DEHYDROGENASE: LDH: 219 U/L (ref 125–245)

## 2017-10-13 NOTE — Telephone Encounter (Signed)
Appointments scheduled AVS/Calendar printed per 5/31 los °

## 2017-10-16 NOTE — Assessment & Plan Note (Signed)
67 y.o. female with intestinal AVM history with associated chronic GI blood loss resulting in iron deficiency and blood loss anemia. Patient has been managed with parenteral iron supplementation blood transfusions as the AVMs were to locate. History has been complicated by arterial thrombosis event for which patient does not currently receiving therapeutic anticoagulation due to history of bleeding. Patient recently had repeat endoscopies and no active bleeding is detected.    Patient underwent additional infusions of iron with Feraheme with excellent response based on iron panel and hemoglobin.  Tolerated infusion without significant difficulties.  Plan: -No need for additional Feraheme at this point in time -Return to clinic in 1 month with labs obtained 2-3 days prior and possible IV iron administration -No need to repeat MRI of the liver at this time unless patient develops new laboratory abnormalities or symptoms. --Blood support plan:  --If Hgb <= 8.0 (based on patient becoming symptomatic from her anemia) -- transfuse 2 units pRBC and re-check Hgb. If not rising appropriately, admit to the hospital for additional transfusions  --If Hgb 8.1-9.0 -- transfuse 1 unit of pRBC and re-check Hgb

## 2017-10-16 NOTE — Progress Notes (Signed)
[facility] Follow-up Visit:  Assessment: Iron deficiency anemia secondary to blood loss (chronic) 67 y.o. female with intestinal AVM history with associated chronic GI blood loss resulting in iron deficiency and blood loss anemia. Patient has been managed with parenteral iron supplementation blood transfusions as the AVMs were to locate. History has been complicated by arterial thrombosis event for which patient does not currently receiving therapeutic anticoagulation due to history of bleeding. Patient recently had repeat endoscopies and no active bleeding is detected.    Patient underwent additional infusions of iron with Feraheme with excellent response based on iron panel and hemoglobin.  Tolerated infusion without significant difficulties.  Plan: -No need for additional Feraheme at this point in time -Return to clinic in 1 month with labs obtained 2-3 days prior and possible IV iron administration -No need to repeat MRI of the liver at this time unless patient develops new laboratory abnormalities or symptoms. --Blood support plan:  --If Hgb <= 8.0 (based on patient becoming symptomatic from her anemia) -- transfuse 2 units pRBC and re-check Hgb. If not rising appropriately, admit to the hospital for additional transfusions  --If Hgb 8.1-9.0 -- transfuse 1 unit of pRBC and re-check Hgb   Voice recognition software was used and creation of this note. Despite my best effort at editing the text, some misspelling/errors may have occurred.  Orders Placed This Encounter  Procedures  . CBC with Differential (Cancer Center Only)    Standing Status:   Future    Standing Expiration Date:   10/14/2018  . Iron and TIBC    Standing Status:   Future    Standing Expiration Date:   10/14/2018  . Ferritin    Standing Status:   Future    Standing Expiration Date:   10/14/2018  . CMP ([facility] only)    Standing Status:   Future    Standing Expiration Date:   10/14/2018    All questions were answered.  . The patient knows to call the clinic with any problems, questions or concerns.  This note was electronically signed.    History of Presenting Illness Bethany [name] is a 67 y.o. female followed in the [facility] for assistance in management of chronic anemia due to running blood loss via AVMs in the GI tract. 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 Patient is not on therapeutic anticoagulation due to recurrent gastrointestinal bleeding. Additional past medical history includes insulin-dependent diabetes mellitus, hypertension, GERD, hypothyroidism, coronary artery disease. Patient also was diagnosed with protein C deficiency in the context of arterial clots.  At the present time, patient continues to smoke 0.5 packs per day which she has done over the past 40 years. She does not consume alcohol. Patient reports low energy, generalized slowing of the activities of daily living, easy fatigability. Also reports cramping in the lower extremities with ambulation. Denies fevers, chills, night sweats. No active chest pain or shortness of breath at rest. No significant abdominal pain, diarrhea, or constipation. No recurrent melena. Patient has been receiving support with parenteral iron supplementation and blood transfusions for management of her chronic anemia.  Patient returns to the clinic for continued hematological monitoring.  In the interim, patient denies any new symptoms.  Reports feeling as well as she has in a long while with better energy levels  and appetite.  No recurrent diarrhea, melena, or hematochezia.  Oncological/hematological History: --Labs, 03/01/17: Hgb   8.8, MCV 73.3, MCH  22.0, Plt 318; tProt 7.1, Alb 4.2, Ca 10.4, Cr 1.2, AP 125; TSH 4.74 --Labs, 03/07/17: Hgb   8.2, MCV 74.4, MCH 21.0, Plt 254; Fe 18, FeSat   4%, TIBC 453, Ferritin 15, Folate 9.3, Vit B12 783 --Admission, 10/29-11/02/18:  Discharge diagnoses  --community-acquired pneumonia, COPD exacerbation, anemia due to acute blood loss and chronic anemia due to chronic gastrointestinal bleeding.    --Labs:     Hgb   6.5, MCV 71.6, MCH 20.3, Plt 279;    --Treatment: 2U pRBC, prednisone   --Labs, 04/03/17: Hgb   9.2, MCV 77.2, MCH 23.1,      Plt 476; Fe 39, FeSat 11%, TIBC 349, Ferritin   79; AP 210, AST 44, ALT 87 --EGD/Colonoscopy, 04/17/17: Endoscopy demonstrates prepyloric patchy mild inflammatory changes.  No ulcers, no malignancy, no source of bleeding.  Colonoscopy significant for 0.6 cm cecal polyp, diverticulosis coli, grade 1 internal hemorrhoids and nonbleeding telangiectasia. --Labs, 04/19/17: Hgb 10.1, MCV 73.9, MCH 21.9, Plt 288; --CT C/A/P, 05/01/17: 2.8 cm density in the posterior right breast as well as a 1.5 cm additional nodule in the same breast.  1.3 cm enhancing abnormality in the right hepatic lobe as well as a 3.3 cm abnormality in the left lobe of the liver. --Labs, 05/17/17: Hgb   9.9, MCV 70.5, MCH 21.3,      Plt 357; Fe 31, FeSat   8%, TIBC 389, Ferritin   29; --MRI Liver, 05/31/17: Focal nodular hyperplasia versus metastatic disease, hepatocellular carcinoma less likely.    --Treatment: Venofer x4 -- 02/02, 02/19, 02/26, 07/18/17 --Labs, 07/14/17: Hgb   9.2, MCV 70.1, MCH 20.7, MCHC 29.5, RDW 24.4, Plt 274; Fe 31, FeSat   8%, TIBC 371, Ferritin 106; --MRI Abdomen, 07/27/17: Two small hypervascular hepatic masses remain stable, with characteristics consistent with benign focal nodular hyperplasia --Labs, 08/11/17: Hgb   8.8, MCV 69.8, MCH 20.3, MCHC 29.1, RDW 22.5, Plt 229; Fe 19, FeSat   5%, TIBC 386, Ferritin   25;   --Treatment: Ferumoxytol 556m IV, 08/25/17 & 09/01/17 --Labs, 09/08/17: Hgb 10.8, MCV 77.2, MCH 22.9, MCHC 29.7, RDW 26.4, Plt 304; Fe 66, FeSat 20%, TIBC 334, Ferritin 482;   --Feraheme 5116mIV x1, 09/29/17 --Labs, 10/13/17: Hgb 12.7, MCV 80.7, MCH 24.2, MCHC 30.0, RDW 19.8, Plt 258; Fe  35, FeSat 10%, TIBC 338, Ferritin 250;  Medical History: Past Medical History:  Diagnosis Date  . Acute renal failure ([facility]  . Arthritis   . Diabetes mellitus without complication ([facility]   type II   . Hypercalcemia   . Hypertension   . Renal disorder   . Single kidney   . Thrombosis     Surgical History: Past Surgical History:  Procedure Laterality Date  . ABDOMINAL HYSTERECTOMY    . blood clots removed from descending aorta     . CHOLECYSTECTOMY    . COLONOSCOPY    . COLONOSCOPY WITH PROPOFOL N/A 04/17/2017   Procedure: COLONOSCOPY WITH PROPOFOL;  Surgeon: ScWilford CornerMD;  Location: [facility];  Service: Endoscopy;  Laterality: N/A;  . ESOPHAGOGASTRODUODENOSCOPY (EGD) WITH PROPOFOL N/A 04/17/2017   Procedure: ESOPHAGOGASTRODUODENOSCOPY (EGD) WITH PROPOFOL;  Surgeon: ScWilford CornerMD;  Location: [facility];  Service: Endoscopy;  Laterality: N/A;  . NEPHRECTOMY RECIPIENT      Family History: Family History  Problem Relation Age of Onset  . Anemia Mother   . Diabetes Father   . Hypertension Brother   . Breast cancer Maternal Aunt  60s    Social History: Social History   Socioeconomic History  . Marital status: Single    Spouse name: Not on file  . Number of children: Not on file  . Years of education: Not on file  . Highest education level: Not on file  Occupational History  . Occupation: retired   Scientific laboratory technician  . Financial resource strain: Not on file  . Food insecurity:    Worry: Not on file    Inability: Not on file  . Transportation needs:    Medical: Not on file    Non-medical: Not on file  Tobacco Use  . Smoking status: Current Every Day Smoker    Types: Cigarettes  . Smokeless tobacco: Never Used  Substance and Sexual Activity  . Alcohol use: No  . Drug use: No  . Sexual activity: Not on file  Lifestyle  . Physical activity:    Days per week: Not on file    Minutes per session: Not on file  . Stress: Not on file   Relationships  . Social connections:    Talks on phone: Not on file    Gets together: Not on file    Attends religious service: Not on file    Active member of club or organization: Not on file    Attends meetings of clubs or organizations: Not on file    Relationship status: Not on file  . Intimate partner violence:    Fear of current or ex partner: Not on file    Emotionally abused: Not on file    Physically abused: Not on file    Forced sexual activity: Not on file  Other Topics Concern  . Not on file  Social History Narrative   Lives with daughter    Dorie Rank from [place]     Allergies: Allergies  Allergen Reactions  . Contrast Media [Iodinated Diagnostic Agents]     Renal hypertension per physician    Medications:  Current Outpatient Medications  Medication Sig Dispense Refill  . amLODipine (NORVASC) 5 MG tablet Take 1 tablet (5 mg total) by mouth daily. 30 tablet 0  . cholecalciferol (VITAMIN D) 1000 units tablet Take 1 tablet (1,000 Units total) by mouth once a week.    . ciclopirox (PENLAC) 8 % solution Apply topically at bedtime. Apply over nail and surrounding skin. Apply daily over previous coat. After seven (7) days, may remove with alcohol and continue cycle. 6.6 mL 11  . Dulaglutide (TRULICITY [place]) Inject into the skin.    Marland Kitchen gabapentin (NEURONTIN) 600 MG tablet Take 600 mg by mouth 4 (four) times daily.     . hydrALAZINE (APRESOLINE) 25 MG tablet Take 25 mg by mouth 3 (three) times daily.    Marland Kitchen HYDROcodone-acetaminophen (NORCO) 10-325 MG tablet Take 1 tablet by mouth every 6 (six) hours as needed for moderate pain.    . Insulin Glargine (TOUJEO SOLOSTAR) 300 UNIT/ML SOPN Inject 54 Units into the skin at bedtime.     . insulin lispro (HUMALOG KWIKPEN) 100 UNIT/ML KiwkPen Inject 22-24 Units into the skin See admin instructions. Inject 24 units with breakfast, 22 units with lunch, and 24 units with dinner    . Ipratropium-Albuterol (COMBIVENT) 20-100 MCG/ACT AERS respimat  Inhale 1 puff into the lungs every 6 (six) hours as needed for wheezing or shortness of breath. 1 Inhaler 0  . levothyroxine (SYNTHROID, LEVOTHROID) 25 MCG tablet Take 12.5 mcg by mouth daily before breakfast.    . metoprolol (LOPRESSOR) 50  MG tablet Take 50 mg by mouth 2 (two) times daily.     . Multiple Vitamins-Minerals (EQ MULTIVITAMINS ADULT GUMMY) CHEW Chew 1 tablet by mouth daily.    . nicotine (NICODERM CQ - DOSED IN MG/24 HOURS) 21 mg/24hr patch Place 1 patch (21 mg total) onto the skin daily. 28 patch 0  . pantoprazole (PROTONIX) 40 MG tablet Take 40 mg by mouth daily.    . polyethylene glycol (MIRALAX / GLYCOLAX) packet Take 17 g by mouth daily. 14 each 0  . PRALUENT 75 MG/ML SOPN Inject 75 mg as directed every 14 (fourteen) days.  4   No current facility-administered medications for this visit.     Review of Systems: Review of Systems  Constitutional: Positive for fatigue.  Musculoskeletal: Positive for myalgias.  All other systems reviewed and are negative.    PHYSICAL EXAMINATION Blood pressure (!) 146/57, pulse 74, temperature 98.5 F (36.9 C), temperature source Oral, resp. rate 18, height '5\' 5"'  (1.651 m), weight 194 lb 11.2 oz (88.3 kg), SpO2 93 %.  ECOG PERFORMANCE STATUS: 2 - Symptomatic, <50% confined to bed  Physical Exam  Constitutional: She is oriented to person, place, and time and well-developed, well-nourished, and in no distress. No distress.  HENT:  Head: Normocephalic and atraumatic.  Mouth/Throat: Oropharynx is clear and moist. No oropharyngeal exudate.  Eyes: Pupils are equal, round, and reactive to light. Conjunctivae and EOM are normal. No scleral icterus.  Neck: No thyromegaly present.  Cardiovascular: Normal rate, regular rhythm, normal heart sounds and intact distal pulses. Exam reveals no gallop.  No murmur heard. Pulmonary/Chest: Effort normal and breath sounds normal. No respiratory distress. She has no wheezes. She has no rales. She exhibits  no tenderness.  Abdominal: Soft. Bowel sounds are normal. She exhibits no distension and no mass. There is no tenderness. There is no rebound and no guarding.  Musculoskeletal: Normal range of motion. She exhibits no edema.  Lymphadenopathy:    She has no cervical adenopathy.  Neurological: She is alert and oriented to person, place, and time. She has normal reflexes. No cranial nerve deficit. Coordination normal.  Skin: No rash noted. She is not diaphoretic. No erythema. There is pallor.     LABORATORY DATA: I have personally reviewed the data as listed: Appointment on 10/13/2017  Component Date Value Ref Range Status  . Ferritin 10/13/2017 250  9 - 269 ng/mL Final   Performed at [facility], [place] [address] [place], [place] [address]  . Iron 10/13/2017 35* 41 - 142 ug/dL Final  . TIBC 10/13/2017 338  236 - 444 ug/dL Final  . Saturation Ratios 10/13/2017 10* 21 - 57 % Final  . UIBC 10/13/2017 303  ug/dL Final   Performed at [facility], [place] [address] [place], [place] [address]  . LDH 10/13/2017 219  125 - 245 U/L Final   Performed at [facility], [place] [address] [place], [place] [address]  . Sodium 10/13/2017 137  136 - 145 mmol/L Final  . Potassium 10/13/2017 4.6  3.5 - 5.1 mmol/L Final  . Chloride 10/13/2017 103  98 - 109 mmol/L Final  . CO2 10/13/2017 26  22 - 29 mmol/L Final  . Glucose, Bld 10/13/2017 143* 70 - 140 mg/dL Final  . BUN 10/13/2017 23  7 - 26 mg/dL Final  . Creatinine 10/13/2017 1.35* 0.60 - 1.10 mg/dL Final  . Calcium 10/13/2017 10.7* 8.4 - 10.4 mg/dL Final  .  Total Protein 10/13/2017 7.5  6.4 - 8.3 g/dL Final  . Albumin 10/13/2017 4.1  3.5 - 5.0 g/dL Final  . AST 10/13/2017 54* 5 - 34 U/L Final  . ALT 10/13/2017 69* 0 - 55 U/L Final  . Alkaline Phosphatase 10/13/2017 158* 40 - 150 U/L Final  . Total Bilirubin 10/13/2017 0.3  0.2 - 1.2 mg/dL Final  . GFR, Est Non Af Am 10/13/2017 40*  >60 mL/min Final  . GFR, Est AFR Am 10/13/2017 46* >60 mL/min Final   Comment: (NOTE) The eGFR has been calculated using the CKD EPI equation. This calculation has not been validated in all clinical situations. eGFR's persistently <60 mL/min signify possible Chronic Kidney Disease.   Georgiann Hahn gap 10/13/2017 8  3 - 11 Final   Performed at [facility], [place] [address] [place], [place] [address]  . Blood Bank Specimen 10/13/2017 SAMPLE AVAILABLE FOR TESTING   Final  . Sample Expiration 10/13/2017    Final                   Value:10/16/2017 Performed at [facility], [place] [address], [place], [place] [address]   . WBC Count 10/13/2017 6.8  3.9 - 10.3 K/uL Final  . RBC 10/13/2017 5.24  3.70 - 5.45 MIL/uL Final  . Hemoglobin 10/13/2017 12.7  11.6 - 15.9 g/dL Final  . HCT 10/13/2017 42.3  34.8 - 46.6 % Final  . MCV 10/13/2017 80.7  79.5 - 101.0 fL Final  . MCH 10/13/2017 24.2* 25.1 - 34.0 pg Final  . MCHC 10/13/2017 30.0* 31.5 - 36.0 g/dL Final  . RDW 10/13/2017 19.8* 11.2 - 14.5 % Final  . Platelet Count 10/13/2017 258  145 - 400 K/uL Final   LARGE AND GIANT PLATELETS NOTED  . Neutrophils Relative % 10/13/2017 72  % Final  . Neutro Abs 10/13/2017 5.0  1.5 - 6.5 K/uL Final  . Lymphocytes Relative 10/13/2017 20  % Final  . Lymphs Abs 10/13/2017 1.3  0.9 - 3.3 K/uL Final  . Monocytes Relative 10/13/2017 5  % Final  . Monocytes Absolute 10/13/2017 0.3  0.1 - 0.9 K/uL Final  . Eosinophils Relative 10/13/2017 3  % Final  . Eosinophils Absolute 10/13/2017 0.2  0.0 - 0.5 K/uL Final  . Basophils Relative 10/13/2017 0  % Final  . Basophils Absolute 10/13/2017 0.0  0.0 - 0.1 K/uL Final   Performed at [facility], [place] [address] [place],  [address]       Ardath Sax, MD

## 2017-10-31 ENCOUNTER — Ambulatory Visit: Payer: Medicare Other | Admitting: Podiatry

## 2017-11-07 ENCOUNTER — Inpatient Hospital Stay: Payer: Medicare Other | Attending: Hematology and Oncology

## 2017-11-07 DIAGNOSIS — D500 Iron deficiency anemia secondary to blood loss (chronic): Secondary | ICD-10-CM | POA: Diagnosis present

## 2017-11-07 DIAGNOSIS — K5900 Constipation, unspecified: Secondary | ICD-10-CM | POA: Diagnosis not present

## 2017-11-07 DIAGNOSIS — J449 Chronic obstructive pulmonary disease, unspecified: Secondary | ICD-10-CM | POA: Diagnosis not present

## 2017-11-07 DIAGNOSIS — N183 Chronic kidney disease, stage 3 (moderate): Secondary | ICD-10-CM | POA: Insufficient documentation

## 2017-11-07 DIAGNOSIS — G8929 Other chronic pain: Secondary | ICD-10-CM | POA: Insufficient documentation

## 2017-11-07 DIAGNOSIS — M1990 Unspecified osteoarthritis, unspecified site: Secondary | ICD-10-CM | POA: Diagnosis not present

## 2017-11-07 DIAGNOSIS — I129 Hypertensive chronic kidney disease with stage 1 through stage 4 chronic kidney disease, or unspecified chronic kidney disease: Secondary | ICD-10-CM | POA: Diagnosis not present

## 2017-11-07 DIAGNOSIS — Q2733 Arteriovenous malformation of digestive system vessel: Secondary | ICD-10-CM | POA: Diagnosis present

## 2017-11-07 DIAGNOSIS — E1122 Type 2 diabetes mellitus with diabetic chronic kidney disease: Secondary | ICD-10-CM | POA: Diagnosis not present

## 2017-11-07 LAB — CMP (CANCER CENTER ONLY)
ALT: 28 U/L (ref 0–44)
AST: 27 U/L (ref 15–41)
Albumin: 4 g/dL (ref 3.5–5.0)
Alkaline Phosphatase: 121 U/L (ref 38–126)
Anion gap: 11 (ref 5–15)
BUN: 21 mg/dL (ref 8–23)
CO2: 24 mmol/L (ref 22–32)
Calcium: 10.6 mg/dL — ABNORMAL HIGH (ref 8.9–10.3)
Chloride: 104 mmol/L (ref 98–111)
Creatinine: 1.18 mg/dL — ABNORMAL HIGH (ref 0.44–1.00)
GFR, Est AFR Am: 54 mL/min — ABNORMAL LOW (ref >60)
GFR, Estimated: 47 mL/min — ABNORMAL LOW (ref >60)
Glucose, Bld: 130 mg/dL — ABNORMAL HIGH (ref 70–99)
Potassium: 4.9 mmol/L (ref 3.5–5.1)
Sodium: 139 mmol/L (ref 135–145)
Total Bilirubin: 0.2 mg/dL — ABNORMAL LOW (ref 0.3–1.2)
Total Protein: 7.1 g/dL (ref 6.5–8.1)

## 2017-11-07 LAB — CBC WITH DIFFERENTIAL (CANCER CENTER ONLY)
Basophils Absolute: 0.1 10*3/uL (ref 0.0–0.1)
Basophils Relative: 1 %
Eosinophils Absolute: 0.2 10*3/uL (ref 0.0–0.5)
Eosinophils Relative: 3 %
HCT: 39.4 % (ref 34.8–46.6)
Hemoglobin: 12.1 g/dL (ref 11.6–15.9)
Lymphocytes Relative: 20 %
Lymphs Abs: 1.3 10*3/uL (ref 0.9–3.3)
MCH: 24.2 pg — ABNORMAL LOW (ref 25.1–34.0)
MCHC: 30.7 g/dL — ABNORMAL LOW (ref 31.5–36.0)
MCV: 78.9 fL — ABNORMAL LOW (ref 79.5–101.0)
Monocytes Absolute: 0.5 10*3/uL (ref 0.1–0.9)
Monocytes Relative: 7 %
Neutro Abs: 4.5 10*3/uL (ref 1.5–6.5)
Neutrophils Relative %: 69 %
Platelet Count: 247 10*3/uL (ref 145–400)
RBC: 5 MIL/uL (ref 3.70–5.45)
RDW: 18 % — ABNORMAL HIGH (ref 11.2–14.5)
WBC Count: 6.5 10*3/uL (ref 3.9–10.3)

## 2017-11-07 LAB — IRON AND TIBC
Iron: 47 ug/dL (ref 41–142)
Saturation Ratios: 14 % — ABNORMAL LOW (ref 21–57)
TIBC: 346 ug/dL (ref 236–444)
UIBC: 299 ug/dL

## 2017-11-07 LAB — FERRITIN: Ferritin: 110 ng/mL (ref 11–307)

## 2017-11-07 NOTE — Progress Notes (Signed)
[place]  Telephone:([phone] [phone]) [phone]  Clinic Follow up Note   Patient Care Team: Wenda Low, MD as PCP - General (Internal Medicine) Truitt Merle, MD as Consulting Physician (Hematology) Wilford Corner, MD as Consulting Physician (Gastroenterology) Angelia Mould, MD as Consulting Physician (Vascular Surgery)   Date of Service:  11/10/2017  History of Presenting Illness non 03/07/18 by Dr. Lebron Conners "Bethany [name] 67 y.o. presenting to the [facility] for assistance in management of chronic anemia due to running blood loss via AVMs in the GI tract. 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 Patient is not on therapeutic anticoagulation due to recurrent gastrointestinal bleeding. Additional past medical history includes insulin-dependent diabetes mellitus, hypertension, GERD, hypothyroidism, coronary artery disease. Patient also was diagnosed with protein C deficiency in the context of arterial clots.  At the present time, patient continues to smoke 0.5 packs per day which she has done over the past 40 years. She does not consume alcohol. Patient reports low energy, generalized slowing of the activities of daily living, easy fatigability. Also reports cramping in the lower extremities with ambulation. Denies fevers, chills, night sweats. No active chest pain or shortness of breath at rest. No significant abdominal pain, diarrhea, or constipation. No recurrent melena.  Patient has been receiving support with parenteral iron supplementation and blood transfusions for management of her chronic anemia."  CURRENT THERAPY: iv Feraheme as needed (if ferritin <100)  INTERVAL HISTORY:  Bethany [name] is here for a follow up her iron deficient anemia. She was previously under the care of Dr. Lebron Conners and has now transferred her care to me. She was last seen by him on 10/16/17. Today is my first visit with her. Her  last IV Feraheme was 09/29/17.   Today she presents to the clinic by herself. She notes she has had anemia all her life and started having iron infusion in 06/2011 given by oncologist in [place]. She moved to [facility] in 12/2016 because her family lives here. She had blood transfusion in 2013, about 15 times and last in 02/2017. She notes still having GI bleeding but much improved with more frequent IV iron. Since 2013 she has been hospitalized for GI bleeding.   Socially is she is retired and lives by herself. She has been smoking for 40 years an is trying to quit, currently at 1/4 pack a day.  Dr. Doren Custard is her vascular surgeon for history blood clots in her ascending aorta. Due to this she had a kidney removed. She notes back pain from sciatic nerve which she takes pain medication for. This managed by pain specialist who she sees monthly.   On review of symptoms, pt notes intermittent epistaxis and blood in stool from AVM. She notes constipation which she takes miralax for. She notes back pain from sciatic nerve.     REVIEW OF SYSTEMS:   Constitutional: Denies fevers, chills or abnormal weight loss Eyes: Denies blurriness of vision Ears, nose, mouth, throat, and face: Denies mucositis or sore throat (+) intermittent epistaxis  Respiratory: Denies cough, dyspnea or wheezes Cardiovascular: Denies palpitation, chest discomfort or lower extremity swelling Gastrointestinal:  Denies nausea, heartburn or (+) constipation controlled with Miralax (+) occasional blood in stool from intestinal AVM Skin: Denies abnormal skin rashes MSK: (+) Chronic back pain from sciatic nerve Lymphatics: Denies new lymphadenopathy or easy bruising Neurological:Denies numbness, tingling or new weaknesses Behavioral/Psych: Mood is stable, no new changes  All other systems  were reviewed with the patient and are negative.  MEDICAL HISTORY:  Past Medical History:  Diagnosis Date  . Acute renal failure ([place])   .  Arthritis   . Diabetes mellitus without complication ([place])    type II   . Hypercalcemia   . Hypertension   . Renal disorder   . Single kidney   . Thrombosis     SURGICAL HISTORY: Past Surgical History:  Procedure Laterality Date  . ABDOMINAL HYSTERECTOMY    . blood clots removed from descending aorta     . CHOLECYSTECTOMY    . COLONOSCOPY    . COLONOSCOPY WITH PROPOFOL N/A 04/17/2017   Procedure: COLONOSCOPY WITH PROPOFOL;  Surgeon: Wilford Corner, MD;  Location: [facility];  Service: Endoscopy;  Laterality: N/A;  . ESOPHAGOGASTRODUODENOSCOPY (EGD) WITH PROPOFOL N/A 04/17/2017   Procedure: ESOPHAGOGASTRODUODENOSCOPY (EGD) WITH PROPOFOL;  Surgeon: Wilford Corner, MD;  Location: [facility];  Service: Endoscopy;  Laterality: N/A;  . NEPHRECTOMY RECIPIENT      I have reviewed the social history and family history with the patient and they are unchanged from previous note.  ALLERGIES:  is allergic to contrast media [iodinated diagnostic agents].  MEDICATIONS:  Current Outpatient Medications  Medication Sig Dispense Refill  . amLODipine (NORVASC) 5 MG tablet Take 1 tablet (5 mg total) by mouth daily. 30 tablet 0  . cholecalciferol (VITAMIN D) 1000 units tablet Take 1 tablet (1,000 Units total) by mouth once a week.    . ciclopirox (PENLAC) 8 % solution Apply topically at bedtime. Apply over nail and surrounding skin. Apply daily over previous coat. After seven (7) days, may remove with alcohol and continue cycle. 6.6 mL 11  . Dulaglutide (TRULICITY [place]) Inject into the skin.    Marland Kitchen gabapentin (NEURONTIN) 600 MG tablet Take 600 mg by mouth 4 (four) times daily.     . hydrALAZINE (APRESOLINE) 25 MG tablet Take 25 mg by mouth 3 (three) times daily.    Marland Kitchen HYDROcodone-acetaminophen (NORCO) 10-325 MG tablet Take 1 tablet by mouth every 6 (six) hours as needed for moderate pain.    . Insulin Glargine (TOUJEO SOLOSTAR) 300 UNIT/ML SOPN Inject 54 Units into the skin at bedtime.     .  insulin lispro (HUMALOG KWIKPEN) 100 UNIT/ML KiwkPen Inject 22-24 Units into the skin See admin instructions. Inject 24 units with breakfast, 22 units with lunch, and 24 units with dinner    . Ipratropium-Albuterol (COMBIVENT) 20-100 MCG/ACT AERS respimat Inhale 1 puff into the lungs every 6 (six) hours as needed for wheezing or shortness of breath. 1 Inhaler 0  . levothyroxine (SYNTHROID, LEVOTHROID) 25 MCG tablet Take 50 mcg by mouth daily before breakfast.     . metoprolol (LOPRESSOR) 50 MG tablet Take 50 mg by mouth 2 (two) times daily.     . nicotine (NICODERM CQ - DOSED IN MG/24 HOURS) 21 mg/24hr patch Place 1 patch (21 mg total) onto the skin daily. 28 patch 0  . pantoprazole (PROTONIX) 40 MG tablet Take 40 mg by mouth daily.    . polyethylene glycol (MIRALAX / GLYCOLAX) packet Take 17 g by mouth daily. 30 each 3  . PRALUENT 75 MG/ML SOPN Inject 75 mg as directed every 14 (fourteen) days.  4   No current facility-administered medications for this visit.     PHYSICAL EXAMINATION: ECOG PERFORMANCE STATUS: 1 - Symptomatic but completely ambulatory  Vitals:   11/10/17 0923  BP: (!) 150/51  Pulse: (!) 55  Temp: 98.8 F (37.1 C)  SpO2: 98%   Filed Weights   11/10/17 0923  Weight: 196 lb 3.2 oz (89 kg)    GENERAL:alert, no distress and comfortable SKIN: skin color, texture, turgor are normal, no rashes or significant lesions, no telangiectasia EYES: normal, Conjunctiva are pink and non-injected, sclera clear OROPHARYNX:no exudate, no erythema and lips, buccal mucosa, and tongue normal   NECK: supple, thyroid normal size, non-tender, without nodularity LYMPH:  no palpable lymphadenopathy in the cervical, axillary or inguinal LUNGS: clear to auscultation and percussion with normal breathing effort HEART: regular rate & rhythm and no murmurs and no lower extremity edema ABDOMEN:abdomen soft, non-tender and normal bowel sounds Musculoskeletal:no cyanosis of digits and no clubbing    NEURO: alert & oriented x 3 with fluent speech, no focal motor/sensory deficits  LABORATORY DATA:  I have reviewed the data as listed CBC Latest Ref Rng & Units 11/07/2017 10/13/2017 09/29/2017  WBC 3.9 - 10.3 K/uL 6.5 6.8 9.2  Hemoglobin 11.6 - 15.9 g/dL 12.1 12.7 11.1(L)  Hematocrit 34.8 - 46.6 % 39.4 42.3 37.6  Platelets 145 - 400 K/uL 247 258 302     CMP Latest Ref Rng & Units 11/07/2017 10/13/2017 09/08/2017  Glucose 70 - 99 mg/dL 130(H) 143(H) 334(H)  BUN 8 - 23 mg/dL 21 23 29(H)  Creatinine 0.44 - 1.00 mg/dL 1.18(H) 1.35(H) 1.39(H)  Sodium 135 - 145 mmol/L 139 137 136  Potassium 3.5 - 5.1 mmol/L 4.9 4.6 4.9  Chloride 98 - 111 mmol/L 104 103 104  CO2 22 - 32 mmol/L 24 26 23   Calcium 8.9 - 10.3 mg/dL 10.6(H) 10.7(H) 11.0(H)  Total Protein 6.5 - 8.1 g/dL 7.1 7.5 7.3  Total Bilirubin 0.3 - 1.2 mg/dL 0.2(L) 0.3 <0.2(L)  Alkaline Phos 38 - 126 U/L 121 158(H) 125  AST 15 - 41 U/L 27 54(H) 13  ALT 0 - 44 U/L 28 69(H) 19   Results for JOLAN, MEALOR (MRN [id number]) as of 11/10/2017 22:09  Ref. Range 08/11/2017 10:34 09/08/2017 10:13 10/13/2017 10:31 11/07/2017 10:38  Iron Latest Ref Range: 41 - 142 ug/dL 19 (L) 66 35 (L) 47  UIBC Latest Units: ug/dL 367 268 303 299  TIBC Latest Ref Range: 236 - 444 ug/dL 386 334 338 346  Saturation Ratios Latest Ref Range: 21 - 57 % 5 (L) 20 (L) 10 (L) 14 (L)  Ferritin Latest Ref Range: 11 - 307 ng/mL 25 482 (H) 250 110    PROCEDURES  Upper Endoscopy by Dr. Michail Sermon 04/17/17 IMPRESSION - Normal esophagus. - Z-line regular, 42 cm from the incisors. - Gastritis. - Normal examined duodenum. Biopsied. BENIGN  Colonoscopy by Dr. Michail Sermon 04/17/17 IMPRESSION - One 6 mm polyp in the cecum, removed with a hot snare. Resected and retrieved. - Diverticulosis in the entire examined colon. - Internal hemorrhoids. - Telangiectasia mucosa in the rectum.   RADIOGRAPHIC STUDIES: I have personally reviewed the radiological images as listed and agreed with  the findings in the report. No results found.   ASSESSMENT & PLAN:  Bethany [name] is a 67 y.o. African-American female with a history of CKD, Arthritis, DM, Hypercalcemia, HTN, and chronic back pain.    1. 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 She has required blood transfusions in the past in 2013 and in 2018 and frequent hospital admission for anemia. -she has intermittent epistaxis, but no skin  or mucosa telangiectasia, no significant family history of bleeding disorder, she does not meet the diagnosis criteria for hereditary hemorrhagic telangiectasia (HHT) -I discussed there is treatment available for AVM, such as tamoxifen, however she had a significant history of arterial thrombosis, not a candidate for tamoxifen.   -Spondylitis to IV iron very well, edema has resolved.  She tolerates IV iron well. -I discussed continuing IV Iron treatment to prevent significant blood loss with goal of Ferritin 100-200 range. Will monitor with monthly labs and IV iron as needed. If she develops interim bleeding she should come in for lab and IV iron if needed.  -I recommend she restart oral iron to help. She is agreeable.  -F/u in 3 months   2. Chronic GI Bleeding, intermittent epistaxis -Has intestinal AVM history  -Had colonoscopy/EGD by Dr. Michail Sermon on 04/17/17 which showed gastritis, diverticulosis and internal hemorrhoids, all benign with no obvious signs of bleeding at that time.  -she has intermittent epistaxis, but no skin or mucosa telangiectasia, no significant family history of bleeding disorder, she does not meet the diagnosis criteria for hereditary hemorrhagic telangiectasia (HHT)   3. H/o of arterial thrombosis -S/p unilateral nephrectomy due to damage from thrombosis -Managed by vascular physician, Dr. Doren Custard   -patient does not currently  receive therapeutic anticoagulation due to history of severe GI bleeding. -Previous work-up for lupus anticoagulant was negative.  4. Smoking Cessation -Currently smokes 1/4 pack a day, she has been smoking for 40 years.  -I discussed smoking cessation as this can increase her risk of blood clots and cancer. I encouraged her to continue to try to quit completely, she understand and is willing to try.   5. Liver FNH -Pt has 2 enhancing lesions in the liver based on CT scan which is a suboptimal study to assess there etiology.   -Follow up MRI Abdomen in 07/27/17 shows stable mass, with characteristics consistent with benign focal nodular hyperplasia as well as stable small benign left adrenal adenoma. -No need to repeat MRI of the liver at this time unless patient develops new laboratory abnormalities or symptoms.  6. Cancer screening  -Patient has previous history of breast mass that was discovered in [place] and reportedly biopsied with benign results. Her 05/2017 MRI liver showed 3cm mass in right breast. Follow up Mammogram on 06/13/17 showed stable mass consistent with benign hamartoma and biopsy-proven fibroadenoma. No mammographic evidence for malignancy -She had a complete hysterectomy, no need for pap smear screenings.  -Will monitor with mammogram in 1 year. I encouraged her to maintain age appropriate cancer screenings.   7. Chronic back pain, constipation  -She has chronic back pain, on hydrocodone and gabapentin, managed by her pain specialist -She has a chronic severe constipation, likely related to narcotics.  She takes magnesium citrate as needed -Strongly encouraged her to try MiraLAX or Senokot on a daily basis for her constipation  8. DM, COPD, HTN, CKD stage III -f/u with PCP   PLAN:  -I refilled Miralax for her today  Lab monthly X3, will call her with results, and set up iv iron if ferritin<100 or significant low iron level  F/u and iv Feraheme in 3 months (a few days  after her lab in 3 months)    No orders of the defined types were placed in this encounter.  All questions were answered. The patient knows to call the clinic with any problems, questions or concerns. No barriers to learning was detected.  I spent 30 minutes counseling the  patient face to face. The total time spent in the appointment was 40 minutes and more than 50% was on counseling and review of test results     Truitt Merle, MD 11/10/2017 10:07 PM   I, Joslyn Devon, am acting as scribe for Truitt Merle, MD.   I have reviewed the above documentation for accuracy and completeness, and I agree with the above.

## 2017-11-10 ENCOUNTER — Other Ambulatory Visit: Payer: Medicare Other

## 2017-11-10 ENCOUNTER — Inpatient Hospital Stay: Payer: Medicare Other

## 2017-11-10 ENCOUNTER — Telehealth: Payer: Self-pay | Admitting: Hematology

## 2017-11-10 ENCOUNTER — Encounter: Payer: Self-pay | Admitting: Hematology

## 2017-11-10 ENCOUNTER — Inpatient Hospital Stay (HOSPITAL_BASED_OUTPATIENT_CLINIC_OR_DEPARTMENT_OTHER): Payer: Medicare Other | Admitting: Hematology

## 2017-11-10 VITALS — BP 150/51 | HR 55 | Temp 98.8°F | Ht 65.0 in | Wt 196.2 lb

## 2017-11-10 DIAGNOSIS — M1990 Unspecified osteoarthritis, unspecified site: Secondary | ICD-10-CM

## 2017-11-10 DIAGNOSIS — I129 Hypertensive chronic kidney disease with stage 1 through stage 4 chronic kidney disease, or unspecified chronic kidney disease: Secondary | ICD-10-CM

## 2017-11-10 DIAGNOSIS — Q2733 Arteriovenous malformation of digestive system vessel: Secondary | ICD-10-CM

## 2017-11-10 DIAGNOSIS — G8929 Other chronic pain: Secondary | ICD-10-CM | POA: Diagnosis not present

## 2017-11-10 DIAGNOSIS — J449 Chronic obstructive pulmonary disease, unspecified: Secondary | ICD-10-CM | POA: Diagnosis not present

## 2017-11-10 DIAGNOSIS — K5900 Constipation, unspecified: Secondary | ICD-10-CM | POA: Diagnosis not present

## 2017-11-10 DIAGNOSIS — N183 Chronic kidney disease, stage 3 (moderate): Secondary | ICD-10-CM

## 2017-11-10 DIAGNOSIS — E1122 Type 2 diabetes mellitus with diabetic chronic kidney disease: Secondary | ICD-10-CM | POA: Diagnosis not present

## 2017-11-10 DIAGNOSIS — D500 Iron deficiency anemia secondary to blood loss (chronic): Secondary | ICD-10-CM | POA: Diagnosis not present

## 2017-11-10 MED ORDER — POLYETHYLENE GLYCOL 3350 17 G PO PACK: 17.0000 g | PACK | Freq: Every day | ORAL | 3 refills | Status: DC

## 2017-11-10 NOTE — Telephone Encounter (Signed)
Appointments scheduled AVS/Calendar printed per 6/28 los

## 2017-11-14 ENCOUNTER — Ambulatory Visit: Payer: Medicare Other | Admitting: Hematology and Oncology

## 2017-11-14 ENCOUNTER — Ambulatory Visit: Payer: Medicare Other

## 2017-12-11 ENCOUNTER — Inpatient Hospital Stay: Payer: Medicare Other | Attending: Hematology and Oncology

## 2017-12-11 DIAGNOSIS — D500 Iron deficiency anemia secondary to blood loss (chronic): Secondary | ICD-10-CM | POA: Diagnosis present

## 2017-12-11 DIAGNOSIS — Q2733 Arteriovenous malformation of digestive system vessel: Secondary | ICD-10-CM | POA: Insufficient documentation

## 2017-12-11 LAB — CBC WITH DIFFERENTIAL (CANCER CENTER ONLY)
Basophils Absolute: 0.1 10*3/uL (ref 0.0–0.1)
Basophils Relative: 1 %
Eosinophils Absolute: 0.2 10*3/uL (ref 0.0–0.5)
Eosinophils Relative: 3 %
HCT: 36.4 % (ref 34.8–46.6)
Hemoglobin: 11.2 g/dL — ABNORMAL LOW (ref 11.6–15.9)
Lymphocytes Relative: 17 %
Lymphs Abs: 1.3 10*3/uL (ref 0.9–3.3)
MCH: 24.3 pg — ABNORMAL LOW (ref 25.1–34.0)
MCHC: 30.9 g/dL — ABNORMAL LOW (ref 31.5–36.0)
MCV: 78.7 fL — ABNORMAL LOW (ref 79.5–101.0)
Monocytes Absolute: 0.5 10*3/uL (ref 0.1–0.9)
Monocytes Relative: 6 %
Neutro Abs: 5.6 10*3/uL (ref 1.5–6.5)
Neutrophils Relative %: 73 %
Platelet Count: 253 10*3/uL (ref 145–400)
RBC: 4.63 MIL/uL (ref 3.70–5.45)
RDW: 16 % — ABNORMAL HIGH (ref 11.2–14.5)
WBC Count: 7.7 10*3/uL (ref 3.9–10.3)

## 2017-12-11 LAB — IRON AND TIBC
Iron: 155 ug/dL — ABNORMAL HIGH (ref 41–142)
Saturation Ratios: 41 % (ref 21–57)
TIBC: 378 ug/dL (ref 236–444)
UIBC: 222 ug/dL

## 2017-12-11 LAB — FERRITIN: Ferritin: 36 ng/mL (ref 11–307)

## 2017-12-18 ENCOUNTER — Telehealth: Payer: Self-pay

## 2017-12-18 NOTE — Telephone Encounter (Signed)
-----   Message from Truitt Merle, MD sent at 12/17/2017  5:49 PM EDT ----- Please let pt know the lab results, she has mild anemia and low iron level, I will set up iv iron for her, thanks   Truitt Merle  12/17/2017

## 2017-12-18 NOTE — Telephone Encounter (Signed)
Left voice message for patient, per Dr. Burr Medico, lab results show mild anemia and low iron level, setting her up for IV iron, told her to expect a call from scheduling.

## 2017-12-20 ENCOUNTER — Telehealth: Payer: Self-pay | Admitting: Hematology

## 2017-12-20 NOTE — Telephone Encounter (Signed)
Spoke with patient regarding appointments added to her schedule per 8/4 sch msg

## 2017-12-27 ENCOUNTER — Inpatient Hospital Stay: Payer: Medicare Other | Attending: Hematology and Oncology

## 2017-12-27 ENCOUNTER — Ambulatory Visit: Payer: Medicare Other

## 2017-12-27 VITALS — BP 126/54 | HR 75 | Temp 98.8°F | Resp 16

## 2017-12-27 DIAGNOSIS — K5520 Angiodysplasia of colon without hemorrhage: Secondary | ICD-10-CM

## 2017-12-27 DIAGNOSIS — D500 Iron deficiency anemia secondary to blood loss (chronic): Secondary | ICD-10-CM | POA: Diagnosis present

## 2017-12-27 DIAGNOSIS — Q2733 Arteriovenous malformation of digestive system vessel: Secondary | ICD-10-CM | POA: Diagnosis not present

## 2017-12-27 MED ORDER — SODIUM CHLORIDE 0.9 % IV SOLN
Freq: Once | INTRAVENOUS | Status: AC
  Administered 2017-12-27: 11:00:00 via INTRAVENOUS
  Filled 2017-12-27: qty 250

## 2017-12-27 MED ORDER — SODIUM CHLORIDE 0.9 % IV SOLN
510.0000 mg | Freq: Once | INTRAVENOUS | Status: AC
  Administered 2017-12-27: 510 mg via INTRAVENOUS
  Filled 2017-12-27: qty 17

## 2017-12-27 NOTE — Patient Instructions (Signed)

## 2018-01-03 ENCOUNTER — Inpatient Hospital Stay: Payer: Medicare Other

## 2018-01-03 VITALS — BP 140/70 | HR 71 | Temp 99.1°F | Resp 18 | Ht 65.0 in | Wt 196.6 lb

## 2018-01-03 DIAGNOSIS — K5520 Angiodysplasia of colon without hemorrhage: Secondary | ICD-10-CM

## 2018-01-03 DIAGNOSIS — D500 Iron deficiency anemia secondary to blood loss (chronic): Secondary | ICD-10-CM

## 2018-01-03 DIAGNOSIS — Q2733 Arteriovenous malformation of digestive system vessel: Secondary | ICD-10-CM | POA: Diagnosis not present

## 2018-01-03 MED ORDER — SODIUM CHLORIDE 0.9 % IV SOLN
Freq: Once | INTRAVENOUS | Status: AC
  Administered 2018-01-03: 14:00:00 via INTRAVENOUS
  Filled 2018-01-03: qty 250

## 2018-01-03 MED ORDER — SODIUM CHLORIDE 0.9 % IV SOLN
510.0000 mg | Freq: Once | INTRAVENOUS | Status: AC
  Administered 2018-01-03: 510 mg via INTRAVENOUS
  Filled 2018-01-03: qty 17

## 2018-01-03 NOTE — Progress Notes (Signed)
Pt refused to stay for 30 min post observation period.  A&Ox4.  Ambulatory w/steady gait.

## 2018-01-03 NOTE — Patient Instructions (Signed)

## 2018-01-10 ENCOUNTER — Ambulatory Visit: Payer: Medicare Other

## 2018-01-11 ENCOUNTER — Inpatient Hospital Stay: Payer: Medicare Other

## 2018-01-11 DIAGNOSIS — D500 Iron deficiency anemia secondary to blood loss (chronic): Secondary | ICD-10-CM

## 2018-01-11 DIAGNOSIS — Q2733 Arteriovenous malformation of digestive system vessel: Secondary | ICD-10-CM | POA: Diagnosis not present

## 2018-01-11 LAB — CBC WITH DIFFERENTIAL (CANCER CENTER ONLY)
Basophils Absolute: 0 10*3/uL (ref 0.0–0.1)
Basophils Relative: 0 %
Eosinophils Absolute: 0.2 10*3/uL (ref 0.0–0.5)
Eosinophils Relative: 2 %
HCT: 41.1 % (ref 34.8–46.6)
Hemoglobin: 12.4 g/dL (ref 11.6–15.9)
Lymphocytes Relative: 16 %
Lymphs Abs: 1 10*3/uL (ref 0.9–3.3)
MCH: 24.5 pg — ABNORMAL LOW (ref 25.1–34.0)
MCHC: 30.2 g/dL — ABNORMAL LOW (ref 31.5–36.0)
MCV: 81.2 fL (ref 79.5–101.0)
Monocytes Absolute: 0.5 10*3/uL (ref 0.1–0.9)
Monocytes Relative: 7 %
Neutro Abs: 5 10*3/uL (ref 1.5–6.5)
Neutrophils Relative %: 75 %
Platelet Count: 213 10*3/uL (ref 145–400)
RBC: 5.06 MIL/uL (ref 3.70–5.45)
RDW: 17.1 % — ABNORMAL HIGH (ref 11.2–14.5)
WBC Count: 6.6 10*3/uL (ref 3.9–10.3)

## 2018-01-11 LAB — IRON AND TIBC
Iron: 54 ug/dL (ref 41–142)
Saturation Ratios: 16 % — ABNORMAL LOW (ref 21–57)
TIBC: 345 ug/dL (ref 236–444)
UIBC: 292 ug/dL

## 2018-01-11 LAB — FERRITIN: Ferritin: 538 ng/mL — ABNORMAL HIGH (ref 11–307)

## 2018-01-16 ENCOUNTER — Telehealth: Payer: Self-pay

## 2018-01-16 NOTE — Telephone Encounter (Signed)
-----   Message from Truitt Merle, MD sent at 01/13/2018  2:34 PM EDT ----- Please let pt know the lab results, anemia resolved, iron level adequate, no concerns, thanks  Truitt Merle  01/13/2018

## 2018-01-16 NOTE — Telephone Encounter (Signed)
Left voice message with lab results, anemia has resolve, iron level is adequate, no concerns.

## 2018-02-12 ENCOUNTER — Inpatient Hospital Stay: Payer: Medicare Other | Attending: Hematology and Oncology

## 2018-02-12 DIAGNOSIS — D500 Iron deficiency anemia secondary to blood loss (chronic): Secondary | ICD-10-CM | POA: Diagnosis present

## 2018-02-12 DIAGNOSIS — Q2733 Arteriovenous malformation of digestive system vessel: Secondary | ICD-10-CM | POA: Diagnosis present

## 2018-02-12 LAB — IRON AND TIBC
Iron: 58 ug/dL (ref 41–142)
Saturation Ratios: 16 % — ABNORMAL LOW (ref 21–57)
TIBC: 354 ug/dL (ref 236–444)
UIBC: 295 ug/dL

## 2018-02-12 LAB — CBC WITH DIFFERENTIAL (CANCER CENTER ONLY)
Basophils Absolute: 0.1 10*3/uL (ref 0.0–0.1)
Basophils Relative: 1 %
Eosinophils Absolute: 0.2 10*3/uL (ref 0.0–0.5)
Eosinophils Relative: 2 %
HCT: 38.2 % (ref 34.8–46.6)
Hemoglobin: 11.8 g/dL (ref 11.6–15.9)
Lymphocytes Relative: 13 %
Lymphs Abs: 1.1 10*3/uL (ref 0.9–3.3)
MCH: 24.6 pg — ABNORMAL LOW (ref 25.1–34.0)
MCHC: 30.8 g/dL — ABNORMAL LOW (ref 31.5–36.0)
MCV: 79.9 fL (ref 79.5–101.0)
Monocytes Absolute: 0.5 10*3/uL (ref 0.1–0.9)
Monocytes Relative: 6 %
Neutro Abs: 6.7 10*3/uL — ABNORMAL HIGH (ref 1.5–6.5)
Neutrophils Relative %: 78 %
Platelet Count: 237 10*3/uL (ref 145–400)
RBC: 4.78 MIL/uL (ref 3.70–5.45)
RDW: 17 % — ABNORMAL HIGH (ref 11.2–14.5)
WBC Count: 8.6 10*3/uL (ref 3.9–10.3)

## 2018-02-12 LAB — FERRITIN: Ferritin: 99 ng/mL (ref 11–307)

## 2018-02-13 NOTE — Progress Notes (Signed)
[facility]  [phone] [phone]  Clinic Follow up Note   Patient Care Team: Wenda Low, MD as PCP - General (Internal Medicine) Truitt Merle, MD as Consulting Physician (Hematology) Wilford Corner, MD as Consulting Physician (Gastroenterology) Angelia Mould, MD as Consulting Physician (Vascular Surgery)   Date of Service:  02/15/2018   Chief complains: f/u anemia   History of Presenting Illness non 03/07/18 by Dr. Lebron Conners "Grayland Ormond Guidice 67 y.o. presenting to the [facility] for assistance in management of chronic anemia due to running blood loss via AVMs in the GI tract. 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 Patient is not on therapeutic anticoagulation due to recurrent gastrointestinal bleeding. Additional past medical history includes insulin-dependent diabetes mellitus, hypertension, GERD, hypothyroidism, coronary artery disease. Patient also was diagnosed with protein C deficiency in the context of arterial clots.  At the present time, patient continues to smoke 0.5 packs per day which she has done over the past 40 years. She does not consume alcohol. Patient reports low energy, generalized slowing of the activities of daily living, easy fatigability. Also reports cramping in the lower extremities with ambulation. Denies fevers, chills, night sweats. No active chest pain or shortness of breath at rest. No significant abdominal pain, diarrhea, or constipation. No recurrent melena.  Patient has been receiving support with parenteral iron supplementation and blood transfusions for management of her chronic anemia."  CURRENT THERAPY: iv Feraheme as needed (if ferritin <100)  INTERVAL HISTORY:  Bethany [name] is here for a follow up her iron deficient anemia. Since our last visit in June, she had 2 Feraheme infusions. Today, she is here alone at the clinic. She feels that her energy  level fluctuates, and she states that she is too tired to work most of the time. She uses an inhaler at home. She states that she sees black stool, but she's not sure if there is blood. Her black stools are there most of the time. Her BMs are sometimes "log like" and sometimes come in pieces. She denies taking iron pills.   She sometimes experiences episodes of nausea, headaches, and blurry vision that has been going on for 2 months now. The episodes are short and resolve spontaneously.  She has intermittent constipation that is relived by Miralax.  REVIEW OF SYSTEMS:   Constitutional: Denies fevers, chills or abnormal weight loss (+) fatigue (+) episodes of headaches Eyes: (+) episodes of blurry vision Ears, nose, mouth, throat, and face: Denies mucositis or sore throat Respiratory: Denies cough, dyspnea or wheezes Cardiovascular: Denies palpitation, chest discomfort or lower extremity swelling Gastrointestinal:  Denies heartburn or (+) block stool (+) episodes of nausea (+) intermittent constipation Skin: Denies abnormal skin rashes MSK: (+) Chronic back pain from sciatic nerve Lymphatics: Denies new lymphadenopathy or easy bruising Neurological:Denies numbness, tingling or new weaknesses Behavioral/Psych: Mood is stable, no new changes  All other systems were reviewed with the patient and are negative.  MEDICAL HISTORY:  Past Medical History:  Diagnosis Date  . Acute renal failure ([place])   . Arthritis   . Diabetes mellitus without complication ([place])    type II   . Hypercalcemia   . Hypertension   . Renal disorder   . Single kidney   . Thrombosis     SURGICAL HISTORY: Past Surgical History:  Procedure Laterality Date  . ABDOMINAL HYSTERECTOMY    . blood clots removed from descending aorta     .  CHOLECYSTECTOMY    . COLONOSCOPY    . COLONOSCOPY WITH PROPOFOL N/A 04/17/2017   Procedure: COLONOSCOPY WITH PROPOFOL;  Surgeon: Wilford Corner, MD;  Location: [facility];   Service: Endoscopy;  Laterality: N/A;  . ESOPHAGOGASTRODUODENOSCOPY (EGD) WITH PROPOFOL N/A 04/17/2017   Procedure: ESOPHAGOGASTRODUODENOSCOPY (EGD) WITH PROPOFOL;  Surgeon: Wilford Corner, MD;  Location: [facility];  Service: Endoscopy;  Laterality: N/A;  . NEPHRECTOMY RECIPIENT      I have reviewed the social history and family history with the patient and they are unchanged from previous note.  ALLERGIES:  is allergic to contrast media [iodinated diagnostic agents].  MEDICATIONS:  Current Outpatient Medications  Medication Sig Dispense Refill  . amLODipine (NORVASC) 5 MG tablet Take 1 tablet (5 mg total) by mouth daily. 30 tablet 0  . cholecalciferol (VITAMIN D) 1000 units tablet Take 1 tablet (1,000 Units total) by mouth once a week.    . ciclopirox (PENLAC) 8 % solution Apply topically at bedtime. Apply over nail and surrounding skin. Apply daily over previous coat. After seven (7) days, may remove with alcohol and continue cycle. 6.6 mL 11  . Dulaglutide (TRULICITY [place]) Inject into the skin.    Marland Kitchen gabapentin (NEURONTIN) 600 MG tablet Take 600 mg by mouth 4 (four) times daily.     . hydrALAZINE (APRESOLINE) 25 MG tablet Take 25 mg by mouth 3 (three) times daily.    . Insulin Glargine (TOUJEO SOLOSTAR) 300 UNIT/ML SOPN Inject 54 Units into the skin at bedtime.     . insulin lispro (HUMALOG KWIKPEN) 100 UNIT/ML KiwkPen Inject 22-24 Units into the skin See admin instructions. Inject 24 units with breakfast, 22 units with lunch, and 24 units with dinner    . Ipratropium-Albuterol (COMBIVENT) 20-100 MCG/ACT AERS respimat Inhale 1 puff into the lungs every 6 (six) hours as needed for wheezing or shortness of breath. 1 Inhaler 0  . levothyroxine (SYNTHROID, LEVOTHROID) 25 MCG tablet Take 50 mcg by mouth daily before breakfast.     . metoprolol (LOPRESSOR) 50 MG tablet Take 50 mg by mouth 2 (two) times daily.     . nicotine (NICODERM CQ - DOSED IN MG/24 HOURS) 21 mg/24hr patch Place 1 patch  (21 mg total) onto the skin daily. 28 patch 0  . oxyCODONE-acetaminophen (PERCOCET) 10-325 MG tablet Take 1 tablet by mouth every 6 (six) hours as needed for pain.    . pantoprazole (PROTONIX) 40 MG tablet Take 40 mg by mouth daily.    . polyethylene glycol (MIRALAX / GLYCOLAX) packet Take 17 g by mouth daily. 30 each 3  . PRALUENT 75 MG/ML SOPN Inject 75 mg as directed every 14 (fourteen) days.  4   No current facility-administered medications for this visit.     PHYSICAL EXAMINATION: ECOG PERFORMANCE STATUS: 1 - Symptomatic but completely ambulatory  Vitals:   02/15/18 1026  BP: (!) 130/56  Pulse: 70  Resp: 17  Temp: 99 F (37.2 C)  SpO2: 98%   Filed Weights   02/15/18 1026  Weight: 195 lb 9.6 oz (88.7 kg)    GENERAL:alert, no distress and comfortable SKIN: skin color, texture, turgor are normal, no rashes or significant lesions, no telangiectasia EYES: normal, Conjunctiva are pink and non-injected, sclera clear OROPHARYNX:no exudate, no erythema and lips, buccal mucosa, and tongue normal   NECK: supple, thyroid normal size, non-tender, without nodularity LYMPH:  no palpable lymphadenopathy in the cervical, axillary or inguinal LUNGS: clear to auscultation and percussion with normal breathing effort HEART: regular  rate & rhythm and no murmurs and no lower extremity edema ABDOMEN:abdomen soft, non-tender and normal bowel sounds Musculoskeletal:no cyanosis of digits and no clubbing  NEURO: alert & oriented x 3 with fluent speech, no focal motor/sensory deficits  LABORATORY DATA:  I have reviewed the data as listed CBC Latest Ref Rng & Units 02/12/2018 01/11/2018 12/11/2017  WBC 3.9 - 10.3 K/uL 8.6 6.6 7.7  Hemoglobin 11.6 - 15.9 g/dL 11.8 12.4 11.2(L)  Hematocrit 34.8 - 46.6 % 38.2 41.1 36.4  Platelets 145 - 400 K/uL 237 213 253     CMP Latest Ref Rng & Units 11/07/2017 10/13/2017 09/08/2017  Glucose 70 - 99 mg/dL 130(H) 143(H) 334(H)  BUN 8 - 23 mg/dL 21 23 29(H)    Creatinine 0.44 - 1.00 mg/dL 1.18(H) 1.35(H) 1.39(H)  Sodium 135 - 145 mmol/L 139 137 136  Potassium 3.5 - 5.1 mmol/L 4.9 4.6 4.9  Chloride 98 - 111 mmol/L 104 103 104  CO2 22 - 32 mmol/L 24 26 23   Calcium 8.9 - 10.3 mg/dL 10.6(H) 10.7(H) 11.0(H)  Total Protein 6.5 - 8.1 g/dL 7.1 7.5 7.3  Total Bilirubin 0.3 - 1.2 mg/dL 0.2(L) 0.3 <0.2(L)  Alkaline Phos 38 - 126 U/L 121 158(H) 125  AST 15 - 41 U/L 27 54(H) 13  ALT 0 - 44 U/L 28 69(H) 19   Results for KEYANI, RIGDON (MRN [id number]) as of 02/13/2018 12:26  Ref. Range 12/11/2017 09:59 01/11/2018 13:39 02/12/2018 11:11  Iron Latest Ref Range: 41 - 142 ug/dL 155 (H) 54 58  UIBC Latest Units: ug/dL 222 292 295  TIBC Latest Ref Range: 236 - 444 ug/dL 378 345 354  Saturation Ratios Latest Ref Range: 21 - 57 % 41 16 (L) 16 (L)  Ferritin Latest Ref Range: 11 - 307 ng/mL 36 538 (H) 99    PROCEDURES  Upper Endoscopy by Dr. Michail Sermon 04/17/17 IMPRESSION - Normal esophagus. - Z-line regular, 42 cm from the incisors. - Gastritis. - Normal examined duodenum. Biopsied. BENIGN  Colonoscopy by Dr. Michail Sermon 04/17/17 IMPRESSION - One 6 mm polyp in the cecum, removed with a hot snare. Resected and retrieved. - Diverticulosis in the entire examined colon. - Internal hemorrhoids. - Telangiectasia mucosa in the rectum.   RADIOGRAPHIC STUDIES: I have personally reviewed the radiological images as listed and agreed with the findings in the report. No results found.   ASSESSMENT & PLAN:  BREEZY HERTENSTEIN is a 67 y.o. African-American female with a history of CKD, Arthritis, DM, Hypercalcemia, HTN, and chronic back pain.    1. 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 She has required blood transfusions in the past in 2013 and in 2018 and frequent hospital admission for anemia. -she has intermittent epistaxis,  but no skin or mucosa telangiectasia, no significant family history of bleeding disorder, she does not meet the diagnosis criteria for hereditary hemorrhagic telangiectasia (HHT) -I discussed there is treatment available for AVM, such as tamoxifen, however she had a significant history of arterial thrombosis, not a candidate for tamoxifen.   -Spondylitis to IV iron very well, edema has resolved.  She tolerates IV iron well. -I discussed continuing IV Iron treatment to prevent significant blood loss with goal of Ferritin 100-200 range. Will monitor with monthly labs and IV iron as needed. If she develops interim bleeding she should come in for lab and  IV iron if needed.  -she is not on oral iron, due to the lack of benefit  -She needed 2 Feraheme infusions in August.  -She states that she is not currently taking iron pills. I discussed that AVM could be the cause of her black stool, and I educated her about possibility of heavy bleeding from the AVM. I informed her to call if she felt tired, or had changes in BM. She also knows to call Dr. Michail Sermon if her bleeding worsens.I explained that her black stools unlikely cancer related. -lab reviewed, hemoglobin 11.8, ferritin 99, slightly low transferrin saturation.  I will proceed with IV Feraheme today -Continue lab monthly, and I will set up IV iron if needed. -F/u in 3 months   2. Chronic GI Bleeding, intermittent epistaxis -Has intestinal AVM history  -Had colonoscopy/EGD by Dr. Michail Sermon on 04/17/17 which showed gastritis, diverticulosis and internal hemorrhoids, all benign with no obvious signs of bleeding at that time.  -she has intermittent epistaxis, but no skin or mucosa telangiectasia, no significant family history of bleeding disorder, she does not meet the diagnosis criteria for hereditary hemorrhagic telangiectasia (HHT) -I discussed that AVM could be the cause of her black stool, and I educated her about possibility of heavy bleeding from the  AVM. I informed her to call if she felt tired, or had changes in BM. She also knows to call Dr. Michail Sermon if her bleeding worsens.I explained that her black stools unlikely cancer related.   3. H/o of arterial thrombosis -S/p unilateral nephrectomy due to damage from thrombosis -Managed by vascular physician, Dr. Doren Custard   -patient does not currently receive therapeutic anticoagulation due to history of severe GI bleeding. -Previous work-up for lupus anticoagulant was negative.  4. Smoking Cessation -Currently smokes 1/4 pack a day, she has been smoking for 40 years.  -I discussed smoking cessation as this can increase her risk of blood clots and cancer. I encouraged her to continue to try to quit completely, she understand and is willing to try.   5. Liver FNH -Pt has 2 enhancing lesions in the liver based on CT scan which is a suboptimal study to assess there etiology.   -Follow up MRI Abdomen in 07/27/17 shows stable mass, with characteristics consistent with benign focal nodular hyperplasia as well as stable small benign left adrenal adenoma. -No need to repeat MRI of the liver at this time unless patient develops new laboratory abnormalities or symptoms.  6. Cancer screening  -Patient has previous history of breast mass that was discovered in [place] and reportedly biopsied with benign results. Her 05/2017 MRI liver showed 3cm mass in right breast. Follow up Mammogram on 06/13/17 showed stable mass consistent with benign hamartoma and biopsy-proven fibroadenoma. No mammographic evidence for malignancy -She had a complete hysterectomy, no need for pap smear screenings.  -Will monitor with mammogram in 1 year. I encouraged her to maintain age appropriate cancer screenings.   7. Chronic back pain, constipation  -She has chronic back pain, on hydrocodone and gabapentin, managed by her pain specialist -She has a chronic severe constipation, likely related to narcotics.  She takes magnesium citrate  as needed -Strongly encouraged her to try MiraLAX or Senokot on a daily basis for her constipation  8. DM, COPD, HTN, CKD stage III -f/u with PCP   PLAN:  -Lab reviewed, will proceed Feraheme today  -I advised her to use Colace is needed for constipation -Lab monthly X3, will set up IV Feraheme if ferritin less than  100 or significant low serum iron -Lab, f/u and infusion in 3 months    No orders of the defined types were placed in this encounter.  All questions were answered. The patient knows to call the clinic with any problems, questions or concerns. No barriers to learning was detected.  I spent 15 minutes counseling the patient face to face. The total time spent in the appointment was 20 minutes and more than 50% was on counseling and review of test results  I, Noor Dweik am acting as scribe for Dr. Truitt Merle.  I have reviewed the above documentation for accuracy and completeness, and I agree with the above.      Truitt Merle, MD 02/15/2018 11:20 AM

## 2018-02-15 ENCOUNTER — Inpatient Hospital Stay: Payer: Medicare Other | Attending: Hematology and Oncology | Admitting: Hematology

## 2018-02-15 ENCOUNTER — Encounter: Payer: Self-pay | Admitting: Hematology

## 2018-02-15 ENCOUNTER — Inpatient Hospital Stay: Payer: Medicare Other

## 2018-02-15 ENCOUNTER — Telehealth: Payer: Self-pay | Admitting: Hematology

## 2018-02-15 VITALS — BP 130/56 | HR 70 | Temp 99.0°F | Resp 17 | Ht 65.0 in | Wt 195.6 lb

## 2018-02-15 DIAGNOSIS — N183 Chronic kidney disease, stage 3 (moderate): Secondary | ICD-10-CM

## 2018-02-15 DIAGNOSIS — Z86718 Personal history of other venous thrombosis and embolism: Secondary | ICD-10-CM

## 2018-02-15 DIAGNOSIS — D500 Iron deficiency anemia secondary to blood loss (chronic): Secondary | ICD-10-CM

## 2018-02-15 DIAGNOSIS — I129 Hypertensive chronic kidney disease with stage 1 through stage 4 chronic kidney disease, or unspecified chronic kidney disease: Secondary | ICD-10-CM

## 2018-02-15 DIAGNOSIS — E1122 Type 2 diabetes mellitus with diabetic chronic kidney disease: Secondary | ICD-10-CM | POA: Insufficient documentation

## 2018-02-15 DIAGNOSIS — Q2733 Arteriovenous malformation of digestive system vessel: Secondary | ICD-10-CM | POA: Diagnosis present

## 2018-02-15 DIAGNOSIS — K5520 Angiodysplasia of colon without hemorrhage: Secondary | ICD-10-CM

## 2018-02-15 MED ORDER — SODIUM CHLORIDE 0.9 % IV SOLN
Freq: Once | INTRAVENOUS | Status: AC
  Administered 2018-02-15: 12:00:00 via INTRAVENOUS
  Filled 2018-02-15: qty 250

## 2018-02-15 MED ORDER — SODIUM CHLORIDE 0.9 % IV SOLN
510.0000 mg | Freq: Once | INTRAVENOUS | Status: AC
  Administered 2018-02-15: 510 mg via INTRAVENOUS
  Filled 2018-02-15: qty 17

## 2018-02-15 NOTE — Telephone Encounter (Signed)
Gave pt avs and calendar  °

## 2018-03-19 ENCOUNTER — Inpatient Hospital Stay: Payer: Medicare Other | Attending: Hematology and Oncology

## 2018-03-19 DIAGNOSIS — D500 Iron deficiency anemia secondary to blood loss (chronic): Secondary | ICD-10-CM | POA: Diagnosis present

## 2018-03-19 DIAGNOSIS — Q2733 Arteriovenous malformation of digestive system vessel: Secondary | ICD-10-CM | POA: Insufficient documentation

## 2018-03-19 LAB — CBC WITH DIFFERENTIAL (CANCER CENTER ONLY)
Abs Immature Granulocytes: 0.02 10*3/uL (ref 0.00–0.07)
Basophils Absolute: 0 10*3/uL (ref 0.0–0.1)
Basophils Relative: 1 %
Eosinophils Absolute: 0.3 10*3/uL (ref 0.0–0.5)
Eosinophils Relative: 3 %
HCT: 40.8 % (ref 36.0–46.0)
Hemoglobin: 12.3 g/dL (ref 12.0–15.0)
Immature Granulocytes: 0 %
Lymphocytes Relative: 15 %
Lymphs Abs: 1.3 10*3/uL (ref 0.7–4.0)
MCH: 24.4 pg — ABNORMAL LOW (ref 26.0–34.0)
MCHC: 30.1 g/dL (ref 30.0–36.0)
MCV: 80.8 fL (ref 80.0–100.0)
Monocytes Absolute: 0.4 10*3/uL (ref 0.1–1.0)
Monocytes Relative: 5 %
Neutro Abs: 6.4 10*3/uL (ref 1.7–7.7)
Neutrophils Relative %: 76 %
Platelet Count: 291 10*3/uL (ref 150–400)
RBC: 5.05 MIL/uL (ref 3.87–5.11)
RDW: 15.2 % (ref 11.5–15.5)
WBC Count: 8.4 10*3/uL (ref 4.0–10.5)
nRBC: 0 % (ref 0.0–0.2)

## 2018-03-19 LAB — IRON AND TIBC
Iron: 61 ug/dL (ref 41–142)
Saturation Ratios: 17 % — ABNORMAL LOW (ref 21–57)
TIBC: 354 ug/dL (ref 236–444)
UIBC: 293 ug/dL (ref 120–384)

## 2018-03-19 LAB — FERRITIN: Ferritin: 117 ng/mL (ref 11–307)

## 2018-03-21 ENCOUNTER — Telehealth: Payer: Self-pay

## 2018-03-21 NOTE — Telephone Encounter (Signed)
-----   Message from Truitt Merle, MD sent at 03/20/2018  8:10 AM EST ----- Please let pt know her lab results, iron level good, no anemia, no need for iv iron now, continue monitoring, thanks   Truitt Merle  03/20/2018

## 2018-03-21 NOTE — Telephone Encounter (Signed)
Left voice message for patient per Dr. Burr Medico with lab results, no anemia, iron level is good, no need for IV iron now, will continue to monitor.

## 2018-04-17 ENCOUNTER — Inpatient Hospital Stay: Payer: Medicare Other | Attending: Hematology and Oncology

## 2018-04-17 DIAGNOSIS — F17210 Nicotine dependence, cigarettes, uncomplicated: Secondary | ICD-10-CM | POA: Insufficient documentation

## 2018-04-17 DIAGNOSIS — N183 Chronic kidney disease, stage 3 (moderate): Secondary | ICD-10-CM | POA: Insufficient documentation

## 2018-04-17 DIAGNOSIS — J449 Chronic obstructive pulmonary disease, unspecified: Secondary | ICD-10-CM | POA: Diagnosis not present

## 2018-04-17 DIAGNOSIS — I129 Hypertensive chronic kidney disease with stage 1 through stage 4 chronic kidney disease, or unspecified chronic kidney disease: Secondary | ICD-10-CM | POA: Diagnosis not present

## 2018-04-17 DIAGNOSIS — D500 Iron deficiency anemia secondary to blood loss (chronic): Secondary | ICD-10-CM | POA: Insufficient documentation

## 2018-04-17 DIAGNOSIS — E1122 Type 2 diabetes mellitus with diabetic chronic kidney disease: Secondary | ICD-10-CM | POA: Insufficient documentation

## 2018-04-17 DIAGNOSIS — Q2733 Arteriovenous malformation of digestive system vessel: Secondary | ICD-10-CM | POA: Diagnosis present

## 2018-04-17 LAB — CBC WITH DIFFERENTIAL (CANCER CENTER ONLY)
Abs Immature Granulocytes: 0.07 10*3/uL (ref 0.00–0.07)
Basophils Absolute: 0 10*3/uL (ref 0.0–0.1)
Basophils Relative: 1 %
Eosinophils Absolute: 0.2 10*3/uL (ref 0.0–0.5)
Eosinophils Relative: 2 %
HCT: 37.5 % (ref 36.0–46.0)
Hemoglobin: 11 g/dL — ABNORMAL LOW (ref 12.0–15.0)
Immature Granulocytes: 1 %
Lymphocytes Relative: 19 %
Lymphs Abs: 1.4 10*3/uL (ref 0.7–4.0)
MCH: 23.7 pg — ABNORMAL LOW (ref 26.0–34.0)
MCHC: 29.3 g/dL — ABNORMAL LOW (ref 30.0–36.0)
MCV: 80.8 fL (ref 80.0–100.0)
Monocytes Absolute: 0.5 10*3/uL (ref 0.1–1.0)
Monocytes Relative: 7 %
Neutro Abs: 5.2 10*3/uL (ref 1.7–7.7)
Neutrophils Relative %: 70 %
Platelet Count: 304 10*3/uL (ref 150–400)
RBC: 4.64 MIL/uL (ref 3.87–5.11)
RDW: 16.1 % — ABNORMAL HIGH (ref 11.5–15.5)
WBC Count: 7.3 10*3/uL (ref 4.0–10.5)
nRBC: 0 % (ref 0.0–0.2)

## 2018-04-17 LAB — IRON AND TIBC
Iron: 53 ug/dL (ref 41–142)
Saturation Ratios: 14 % — ABNORMAL LOW (ref 21–57)
TIBC: 376 ug/dL (ref 236–444)
UIBC: 323 ug/dL (ref 120–384)

## 2018-04-17 LAB — FERRITIN: Ferritin: 64 ng/mL (ref 11–307)

## 2018-04-25 ENCOUNTER — Telehealth: Payer: Self-pay

## 2018-04-25 ENCOUNTER — Telehealth: Payer: Self-pay | Admitting: Hematology

## 2018-04-25 ENCOUNTER — Other Ambulatory Visit: Payer: Self-pay | Admitting: Hematology

## 2018-04-25 NOTE — Telephone Encounter (Signed)
-----   Message from Truitt Merle, MD sent at 04/25/2018  8:03 AM EST ----- Please let pt know her iron level was slightly low, but her anemia got slightly worse also, I will set up iv iron once for next week, thanks  Truitt Merle  04/25/2018

## 2018-04-25 NOTE — Telephone Encounter (Signed)
Scheduled appt per 12/11 sch message - pt is aware of appt date and time   

## 2018-04-25 NOTE — Telephone Encounter (Signed)
Spoke with patient per Dr. Burr Medico notified her lab results showed iron level was slightly low and anemia has gotten a little worse, Dr. Burr Medico recommends IV iron once next week.  The patient verbalized an understanding and is in agreement with the plan.  Scheduling message was sent.

## 2018-05-04 ENCOUNTER — Inpatient Hospital Stay: Payer: Medicare Other

## 2018-05-04 VITALS — BP 160/61 | HR 67 | Temp 98.1°F | Resp 14

## 2018-05-04 DIAGNOSIS — Q2733 Arteriovenous malformation of digestive system vessel: Secondary | ICD-10-CM | POA: Diagnosis not present

## 2018-05-04 DIAGNOSIS — D500 Iron deficiency anemia secondary to blood loss (chronic): Secondary | ICD-10-CM

## 2018-05-04 DIAGNOSIS — K5520 Angiodysplasia of colon without hemorrhage: Secondary | ICD-10-CM

## 2018-05-04 MED ORDER — SODIUM CHLORIDE 0.9 % IV SOLN
Freq: Once | INTRAVENOUS | Status: AC
  Administered 2018-05-04: 09:00:00 via INTRAVENOUS
  Filled 2018-05-04: qty 250

## 2018-05-04 MED ORDER — SODIUM CHLORIDE 0.9 % IV SOLN
510.0000 mg | Freq: Once | INTRAVENOUS | Status: AC
  Administered 2018-05-04: 510 mg via INTRAVENOUS
  Filled 2018-05-04: qty 17

## 2018-05-04 NOTE — Patient Instructions (Signed)

## 2018-05-10 NOTE — Progress Notes (Signed)
[place]   Telephone:(336) [phone] Fax:(336) [phone]   Clinic Follow up Note   Patient Care Team: Wenda Low, MD as PCP - General (Internal Medicine) Truitt Merle, MD as Consulting Physician (Hematology) Wilford Corner, MD as Consulting Physician (Gastroenterology) Angelia Mould, MD as Consulting Physician (Vascular Surgery)  Date of Service:  05/11/2018  CHIEF COMPLAINT: F/u anemia    CURRENT THERAPY:  iv Feraheme as needed (if ferritin <100)  INTERVAL HISTORY:  Bethany [name] is here for a follow up of anemia She presents in the infusion room by herself. She notes after her infusion she felt really sleepy even when driving. She denies SOB when being active. She notes black stool every stool which is daily since about 1 months ago. She has not been on oral iron. Dr. Michail Sermon is her GI. Her last colonoscopy was 1 year ago.    REVIEW OF SYSTEMS:   Constitutional: Denies fevers, chills or abnormal weight loss (+) sleepy Eyes: Denies blurriness of vision Ears, nose, mouth, throat, and face: Denies mucositis or sore throat Respiratory: Denies cough, dyspnea or wheezes Cardiovascular: Denies palpitation, chest discomfort or lower extremity swelling Gastrointestinal:  Denies nausea, heartburn or change in bowel habits (+)black loose stool Skin: Denies abnormal skin rashes Lymphatics: Denies new lymphadenopathy or easy bruising Neurological:Denies numbness, tingling or new weaknesses Behavioral/Psych: Mood is stable, no new changes  All other systems were reviewed with the patient and are negative.  MEDICAL HISTORY:  Past Medical History:  Diagnosis Date  . Acute renal failure ([place])   . Arthritis   . Diabetes mellitus without complication ([place])    type II   . Hypercalcemia   . Hypertension   . Renal disorder   . Single kidney   . Thrombosis     SURGICAL HISTORY: Past Surgical History:  Procedure Laterality Date  . ABDOMINAL HYSTERECTOMY      . blood clots removed from descending aorta     . CHOLECYSTECTOMY    . COLONOSCOPY    . COLONOSCOPY WITH PROPOFOL N/A 04/17/2017   Procedure: COLONOSCOPY WITH PROPOFOL;  Surgeon: Wilford Corner, MD;  Location: [facility];  Service: Endoscopy;  Laterality: N/A;  . ESOPHAGOGASTRODUODENOSCOPY (EGD) WITH PROPOFOL N/A 04/17/2017   Procedure: ESOPHAGOGASTRODUODENOSCOPY (EGD) WITH PROPOFOL;  Surgeon: Wilford Corner, MD;  Location: [facility];  Service: Endoscopy;  Laterality: N/A;  . NEPHRECTOMY RECIPIENT      I have reviewed the social history and family history with the patient and they are unchanged from previous note.  ALLERGIES:  is allergic to contrast media [iodinated diagnostic agents].  MEDICATIONS:  Current Outpatient Medications  Medication Sig Dispense Refill  . amLODipine (NORVASC) 5 MG tablet Take 1 tablet (5 mg total) by mouth daily. 30 tablet 0  . cholecalciferol (VITAMIN D) 1000 units tablet Take 1 tablet (1,000 Units total) by mouth once a week.    . ciclopirox (PENLAC) 8 % solution Apply topically at bedtime. Apply over nail and surrounding skin. Apply daily over previous coat. After seven (7) days, may remove with alcohol and continue cycle. 6.6 mL 11  . Dulaglutide (TRULICITY [place]) Inject into the skin.    Marland Kitchen gabapentin (NEURONTIN) 600 MG tablet Take 600 mg by mouth 4 (four) times daily.     . hydrALAZINE (APRESOLINE) 25 MG tablet Take 25 mg by mouth 3 (three) times daily.    . Insulin Glargine (TOUJEO SOLOSTAR) 300 UNIT/ML SOPN Inject 54 Units into the skin at bedtime.     Marland Kitchen  insulin lispro (HUMALOG KWIKPEN) 100 UNIT/ML KiwkPen Inject 22-24 Units into the skin See admin instructions. Inject 24 units with breakfast, 22 units with lunch, and 24 units with dinner    . Ipratropium-Albuterol (COMBIVENT) 20-100 MCG/ACT AERS respimat Inhale 1 puff into the lungs every 6 (six) hours as needed for wheezing or shortness of breath. 1 Inhaler 0  . levothyroxine (SYNTHROID,  LEVOTHROID) 25 MCG tablet Take 50 mcg by mouth daily before breakfast.     . metoprolol (LOPRESSOR) 50 MG tablet Take 50 mg by mouth 2 (two) times daily.     . nicotine (NICODERM CQ - DOSED IN MG/24 HOURS) 21 mg/24hr patch Place 1 patch (21 mg total) onto the skin daily. 28 patch 0  . oxyCODONE-acetaminophen (PERCOCET) 10-325 MG tablet Take 1 tablet by mouth every 6 (six) hours as needed for pain.    . pantoprazole (PROTONIX) 40 MG tablet Take 40 mg by mouth daily.    . polyethylene glycol (MIRALAX / GLYCOLAX) packet Take 17 g by mouth daily. 30 each 3  . PRALUENT 75 MG/ML SOPN Inject 75 mg as directed every 14 (fourteen) days.  4   No current facility-administered medications for this visit.     PHYSICAL EXAMINATION: ECOG PERFORMANCE STATUS: 1 - Symptomatic but completely ambulatory Blood pressure of 129/50, heart rate 60, respirate 18, temperature 36.9, pulse ox 96% on room air GENERAL:alert, no distress and comfortable SKIN: skin color, texture, turgor are normal, no rashes or significant lesions EYES: normal, Conjunctiva are pink and non-injected, sclera clear OROPHARYNX:no exudate, no erythema and lips, buccal mucosa, and tongue normal  NECK: supple, thyroid normal size, non-tender, without nodularity LYMPH:  no palpable lymphadenopathy in the cervical, axillary or inguinal LUNGS: clear to auscultation and percussion with normal breathing effort HEART: regular rate & rhythm and no murmurs and no lower extremity edema ABDOMEN:abdomen soft, non-tender and normal bowel sounds Musculoskeletal:no cyanosis of digits and no clubbing  NEURO: alert & oriented x 3 with fluent speech, no focal motor/sensory deficits  LABORATORY DATA:  I have reviewed the data as listed CBC Latest Ref Rng & Units 05/11/2018 04/17/2018 03/19/2018  WBC 4.0 - 10.5 K/uL 9.9 7.3 8.4  Hemoglobin 12.0 - 15.0 g/dL 11.3(L) 11.0(L) 12.3  Hematocrit 36.0 - 46.0 % 39.0 37.5 40.8  Platelets 150 - 400 K/uL 276 304 291      CMP Latest Ref Rng & Units 11/07/2017 10/13/2017 09/08/2017  Glucose 70 - 99 mg/dL 130(H) 143(H) 334(H)  BUN 8 - 23 mg/dL 21 23 29(H)  Creatinine 0.44 - 1.00 mg/dL 1.18(H) 1.35(H) 1.39(H)  Sodium 135 - 145 mmol/L 139 137 136  Potassium 3.5 - 5.1 mmol/L 4.9 4.6 4.9  Chloride 98 - 111 mmol/L 104 103 104  CO2 22 - 32 mmol/L 24 26 23   Calcium 8.9 - 10.3 mg/dL 10.6(H) 10.7(H) 11.0(H)  Total Protein 6.5 - 8.1 g/dL 7.1 7.5 7.3  Total Bilirubin 0.3 - 1.2 mg/dL 0.2(L) 0.3 <0.2(L)  Alkaline Phos 38 - 126 U/L 121 158(H) 125  AST 15 - 41 U/L 27 54(H) 13  ALT 0 - 44 U/L 28 69(H) 19      RADIOGRAPHIC STUDIES: I have personally reviewed the radiological images as listed and agreed with the findings in the report. No results found.   ASSESSMENT & PLAN:  Bethany [name] is a 67 y.o. female with   1. Iron deficient anemia, secondary to chronic blood loss from GI AVM  -Pt has intestinal AVM history with associated  chronic GI blood loss resulting in iron deficiency and blood loss anemia. She has required blood transfusions in the past in 2013 and in 2018 and frequent hospital admission for anemia. -she has intermittent epistaxis, but no skin or mucosa telangiectasia, no significant family history of bleeding disorder, she does not meet the diagnosis criteria for hereditary hemorrhagic telangiectasia (HHT) -I recommend IV Iron treatment to prevent significant anemia with goal of Ferritin 100-200 range. Will monitor with monthly labs and IV iron as needed. If she develops interim bleeding she should come in for lab and IV iron if needed.  -she is not on oral iron, due to the lack of benefit  -Her Black stool has returned, will refer her to Dr. Michail Sermon to have another colonoscopy. Her last colonoscopy was a year ago  -lab reviewed, hemoglobin 11.3, stable. Last ferritin was 64 so she will proceed with IV Feraheme today -F/u in 2 months   2. Chronic GI Bleeding, intermittent epistaxis -Has intestinal  AVM history  -Had colonoscopy/EGD by Dr. Michail Sermon on 04/17/17 which showed gastritis, diverticulosis and internal hemorrhoids, all benign with no obvious signs of bleeding at that time.  -She does not meet the diagnosis criteria for hereditary hemorrhagic telangiectasia (HHT) -Her black stool has returned, I will refer her to Dr. Michail Sermon for another colonoscopy and workup.    3. H/o of arterial thrombosis -S/p unilateral nephrectomy due to damage from thrombosis -Managed by vascular physician, Dr. Doren Custard   -patient does not currently receive therapeutic anticoagulation due to history of severe GI bleeding. -Previous work-up for lupus anticoagulant was negative.  4. Smoking Cessation -Currently smokes 1/4 pack a day, she has been smoking for 40 years.  -I encouraged her to continue to try to quit completely, she understand and is willing to try.     5. DM, COPD, HTN, CKD stage III -f/u with PCP   PLAN:  -Lab reviewed, will proceed Feraheme today  -Lab in 2 and 4 weeks  -lab and f/u in 2 months   No problem-specific Assessment & Plan notes found for this encounter.   No orders of the defined types were placed in this encounter.  All questions were answered. The patient knows to call the clinic with any problems, questions or concerns. No barriers to learning was detected.  I spent 15 minutes counseling the patient face to face. The total time spent in the appointment was 20 minutes and more than 50% was on counseling and review of test results     Truitt Merle, MD 05/11/2018   I, Joslyn Devon, am acting as scribe for Truitt Merle, MD.   I have reviewed the above documentation for accuracy and completeness, and I agree with the above.

## 2018-05-11 ENCOUNTER — Inpatient Hospital Stay (HOSPITAL_BASED_OUTPATIENT_CLINIC_OR_DEPARTMENT_OTHER): Payer: Medicare Other | Admitting: Hematology

## 2018-05-11 ENCOUNTER — Inpatient Hospital Stay: Payer: Medicare Other

## 2018-05-11 VITALS — BP 129/50 | HR 60 | Temp 98.5°F | Resp 18

## 2018-05-11 DIAGNOSIS — N183 Chronic kidney disease, stage 3 (moderate): Secondary | ICD-10-CM | POA: Diagnosis not present

## 2018-05-11 DIAGNOSIS — I129 Hypertensive chronic kidney disease with stage 1 through stage 4 chronic kidney disease, or unspecified chronic kidney disease: Secondary | ICD-10-CM

## 2018-05-11 DIAGNOSIS — D500 Iron deficiency anemia secondary to blood loss (chronic): Secondary | ICD-10-CM | POA: Diagnosis not present

## 2018-05-11 DIAGNOSIS — F17210 Nicotine dependence, cigarettes, uncomplicated: Secondary | ICD-10-CM

## 2018-05-11 DIAGNOSIS — K5520 Angiodysplasia of colon without hemorrhage: Secondary | ICD-10-CM

## 2018-05-11 DIAGNOSIS — Q2733 Arteriovenous malformation of digestive system vessel: Secondary | ICD-10-CM | POA: Diagnosis not present

## 2018-05-11 DIAGNOSIS — E1122 Type 2 diabetes mellitus with diabetic chronic kidney disease: Secondary | ICD-10-CM

## 2018-05-11 DIAGNOSIS — J449 Chronic obstructive pulmonary disease, unspecified: Secondary | ICD-10-CM

## 2018-05-11 LAB — CBC WITH DIFFERENTIAL (CANCER CENTER ONLY)
Abs Immature Granulocytes: 0.11 10*3/uL — ABNORMAL HIGH (ref 0.00–0.07)
Basophils Absolute: 0 10*3/uL (ref 0.0–0.1)
Basophils Relative: 0 %
Eosinophils Absolute: 0.2 10*3/uL (ref 0.0–0.5)
Eosinophils Relative: 2 %
HCT: 39 % (ref 36.0–46.0)
Hemoglobin: 11.3 g/dL — ABNORMAL LOW (ref 12.0–15.0)
Immature Granulocytes: 1 %
Lymphocytes Relative: 10 %
Lymphs Abs: 1 10*3/uL (ref 0.7–4.0)
MCH: 24.1 pg — ABNORMAL LOW (ref 26.0–34.0)
MCHC: 29 g/dL — ABNORMAL LOW (ref 30.0–36.0)
MCV: 83.3 fL (ref 80.0–100.0)
Monocytes Absolute: 0.4 10*3/uL (ref 0.1–1.0)
Monocytes Relative: 4 %
Neutro Abs: 8.2 10*3/uL — ABNORMAL HIGH (ref 1.7–7.7)
Neutrophils Relative %: 83 %
Platelet Count: 276 10*3/uL (ref 150–400)
RBC: 4.68 MIL/uL (ref 3.87–5.11)
RDW: 19.4 % — ABNORMAL HIGH (ref 11.5–15.5)
WBC Count: 9.9 10*3/uL (ref 4.0–10.5)
nRBC: 0.7 % — ABNORMAL HIGH (ref 0.0–0.2)

## 2018-05-11 LAB — FERRITIN: Ferritin: 252 ng/mL (ref 11–307)

## 2018-05-11 LAB — IRON AND TIBC
Iron: 58 ug/dL (ref 41–142)
Saturation Ratios: 16 % — ABNORMAL LOW (ref 21–57)
TIBC: 360 ug/dL (ref 236–444)
UIBC: 302 ug/dL (ref 120–384)

## 2018-05-11 MED ORDER — SODIUM CHLORIDE 0.9 % IV SOLN
Freq: Once | INTRAVENOUS | Status: AC
  Administered 2018-05-11: 12:00:00 via INTRAVENOUS
  Filled 2018-05-11: qty 250

## 2018-05-11 MED ORDER — SODIUM CHLORIDE 0.9 % IV SOLN
510.0000 mg | Freq: Once | INTRAVENOUS | Status: AC
  Administered 2018-05-11: 510 mg via INTRAVENOUS
  Filled 2018-05-11: qty 17

## 2018-05-11 NOTE — Patient Instructions (Signed)

## 2018-05-11 NOTE — Progress Notes (Signed)
Pt refused to stay for 30 minute wait post feraheme. VS stable and pt asymptomatic upon d/c.

## 2018-05-12 ENCOUNTER — Encounter: Payer: Self-pay | Admitting: Hematology

## 2018-05-14 ENCOUNTER — Telehealth: Payer: Self-pay | Admitting: Hematology

## 2018-05-14 NOTE — Telephone Encounter (Signed)
Spoke with patient about scheduled appointments per 12/27 los.

## 2018-05-25 ENCOUNTER — Inpatient Hospital Stay: Payer: Medicare Other | Attending: Hematology and Oncology

## 2018-05-25 DIAGNOSIS — Q2733 Arteriovenous malformation of digestive system vessel: Secondary | ICD-10-CM | POA: Diagnosis not present

## 2018-05-25 DIAGNOSIS — D500 Iron deficiency anemia secondary to blood loss (chronic): Secondary | ICD-10-CM

## 2018-05-25 LAB — IRON AND TIBC
Iron: 72 ug/dL (ref 41–142)
Saturation Ratios: 20 % — ABNORMAL LOW (ref 21–57)
TIBC: 360 ug/dL (ref 236–444)
UIBC: 288 ug/dL (ref 120–384)

## 2018-05-25 LAB — CBC WITH DIFFERENTIAL (CANCER CENTER ONLY)
Abs Immature Granulocytes: 0.04 10*3/uL (ref 0.00–0.07)
Basophils Absolute: 0 10*3/uL (ref 0.0–0.1)
Basophils Relative: 0 %
Eosinophils Absolute: 0.1 10*3/uL (ref 0.0–0.5)
Eosinophils Relative: 2 %
HCT: 41.5 % (ref 36.0–46.0)
Hemoglobin: 12.4 g/dL (ref 12.0–15.0)
Immature Granulocytes: 1 %
Lymphocytes Relative: 12 %
Lymphs Abs: 1 10*3/uL (ref 0.7–4.0)
MCH: 24.3 pg — ABNORMAL LOW (ref 26.0–34.0)
MCHC: 29.9 g/dL — ABNORMAL LOW (ref 30.0–36.0)
MCV: 81.4 fL (ref 80.0–100.0)
Monocytes Absolute: 0.4 10*3/uL (ref 0.1–1.0)
Monocytes Relative: 5 %
Neutro Abs: 6.7 10*3/uL (ref 1.7–7.7)
Neutrophils Relative %: 80 %
Platelet Count: 313 10*3/uL (ref 150–400)
RBC: 5.1 MIL/uL (ref 3.87–5.11)
RDW: 18.5 % — ABNORMAL HIGH (ref 11.5–15.5)
WBC Count: 8.4 10*3/uL (ref 4.0–10.5)
nRBC: 0 % (ref 0.0–0.2)

## 2018-05-25 LAB — FERRITIN: Ferritin: 393 ng/mL — ABNORMAL HIGH (ref 11–307)

## 2018-05-29 ENCOUNTER — Telehealth: Payer: Self-pay

## 2018-05-29 NOTE — Telephone Encounter (Signed)
-----   Message from Truitt Merle, MD sent at 05/27/2018  8:40 PM EST ----- Please let pt know her lab results, anemia resolved, iron level good, she responded to iv iron well, thanks   Truitt Merle  05/27/2018

## 2018-05-29 NOTE — Telephone Encounter (Signed)
Left voice message for patient regarding lab results.  Per Dr. Burr Medico anemia has resolved, iron level is good, responded well to IV iron.  No concerns.

## 2018-06-08 ENCOUNTER — Inpatient Hospital Stay: Payer: Medicare Other

## 2018-06-08 DIAGNOSIS — Q2733 Arteriovenous malformation of digestive system vessel: Secondary | ICD-10-CM | POA: Diagnosis not present

## 2018-06-08 DIAGNOSIS — D500 Iron deficiency anemia secondary to blood loss (chronic): Secondary | ICD-10-CM

## 2018-06-08 LAB — CBC WITH DIFFERENTIAL (CANCER CENTER ONLY)
Abs Immature Granulocytes: 0.04 10*3/uL (ref 0.00–0.07)
Basophils Absolute: 0 10*3/uL (ref 0.0–0.1)
Basophils Relative: 1 %
Eosinophils Absolute: 0.2 10*3/uL (ref 0.0–0.5)
Eosinophils Relative: 2 %
HCT: 38 % (ref 36.0–46.0)
Hemoglobin: 11.2 g/dL — ABNORMAL LOW (ref 12.0–15.0)
Immature Granulocytes: 1 %
Lymphocytes Relative: 13 %
Lymphs Abs: 1.1 10*3/uL (ref 0.7–4.0)
MCH: 24.2 pg — ABNORMAL LOW (ref 26.0–34.0)
MCHC: 29.5 g/dL — ABNORMAL LOW (ref 30.0–36.0)
MCV: 82.1 fL (ref 80.0–100.0)
Monocytes Absolute: 0.5 10*3/uL (ref 0.1–1.0)
Monocytes Relative: 6 %
Neutro Abs: 6.4 10*3/uL (ref 1.7–7.7)
Neutrophils Relative %: 77 %
Platelet Count: 262 10*3/uL (ref 150–400)
RBC: 4.63 MIL/uL (ref 3.87–5.11)
RDW: 17.1 % — ABNORMAL HIGH (ref 11.5–15.5)
WBC Count: 8.2 10*3/uL (ref 4.0–10.5)
nRBC: 0 % (ref 0.0–0.2)

## 2018-06-08 LAB — FERRITIN: Ferritin: 108 ng/mL (ref 11–307)

## 2018-06-08 LAB — IRON AND TIBC
Iron: 39 ug/dL — ABNORMAL LOW (ref 41–142)
Saturation Ratios: 11 % — ABNORMAL LOW (ref 21–57)
TIBC: 338 ug/dL (ref 236–444)
UIBC: 299 ug/dL (ref 120–384)

## 2018-06-11 ENCOUNTER — Telehealth: Payer: Self-pay | Admitting: Hematology

## 2018-06-11 ENCOUNTER — Telehealth: Payer: Self-pay | Admitting: *Deleted

## 2018-06-11 NOTE — Telephone Encounter (Signed)
Scheduled appt per 1/24 sch message - pt is aware of appt date and time   

## 2018-06-11 NOTE — Telephone Encounter (Signed)
-----   Message from Truitt Merle, MD sent at 06/08/2018  9:06 PM EST ----- Please let her know the lab results. She received iv feraheme 4 weeks ago, responded well, but she has mild anemia and iron deficiency again, I will set up iv feraheme one more dose in a week, thanks   [employer]  06/08/2018

## 2018-06-11 NOTE — Telephone Encounter (Signed)
TCT patient regarding labs from last week.  Spoke with patient and informed her that she is still mildly anemic and with iron deficiency.  She is scheduled for an additional iron infusion this Thursday @ 8am.  Pt voiced understanding. No further questions or concerns.

## 2018-06-14 ENCOUNTER — Inpatient Hospital Stay: Payer: Medicare Other

## 2018-06-14 VITALS — BP 153/63 | HR 66 | Temp 98.1°F | Resp 18

## 2018-06-14 DIAGNOSIS — Q2733 Arteriovenous malformation of digestive system vessel: Secondary | ICD-10-CM | POA: Diagnosis not present

## 2018-06-14 DIAGNOSIS — K5520 Angiodysplasia of colon without hemorrhage: Secondary | ICD-10-CM

## 2018-06-14 DIAGNOSIS — D500 Iron deficiency anemia secondary to blood loss (chronic): Secondary | ICD-10-CM

## 2018-06-14 MED ORDER — SODIUM CHLORIDE 0.9 % IV SOLN
Freq: Once | INTRAVENOUS | Status: AC
  Administered 2018-06-14: 09:00:00 via INTRAVENOUS
  Filled 2018-06-14: qty 250

## 2018-06-14 MED ORDER — SODIUM CHLORIDE 0.9 % IV SOLN
510.0000 mg | Freq: Once | INTRAVENOUS | Status: AC
  Administered 2018-06-14: 510 mg via INTRAVENOUS
  Filled 2018-06-14: qty 17

## 2018-06-14 NOTE — Patient Instructions (Signed)

## 2018-06-18 ENCOUNTER — Other Ambulatory Visit: Payer: Self-pay | Admitting: Internal Medicine

## 2018-06-18 DIAGNOSIS — Z1231 Encounter for screening mammogram for malignant neoplasm of breast: Secondary | ICD-10-CM

## 2018-07-04 NOTE — Progress Notes (Signed)
[place]   Telephone:(336) [phone] Fax:(336) [phone]   Clinic Follow up Note   Patient Care Team: Wenda Low, MD as PCP - General (Internal Medicine) Truitt Merle, MD as Consulting Physician (Hematology) Wilford Corner, MD as Consulting Physician (Gastroenterology) Angelia Mould, MD as Consulting Physician (Vascular Surgery)  Date of Service:  07/06/2018  CHIEF COMPLAINT: F/u of Anemia   CURRENT THERAPY:  iv Feraheme as needed (if ferritin <100)  INTERVAL HISTORY:  Bethany [name] is here for a follow up of anemia. She presents to the clinic today by herself. She notes upper left leg pain. After seeing Dr. Deforest Hoyles she was told her neuropathy has gotten worse form her DM. She notes she had tried topical cream that she didn't realized had aspirin and had a bruise on her upper inner left leg and mild blood in her stool. This has resolved. She is on Gabapentin 600mg  4 times a day. She is on insulin 40 units a day and Humalog now once daily depending on her BG level.  She notes she is active with a trainer a few times a week.  She notes she has intermittent epistaxis. She plans to follow up with Dr. Michail Sermon next week. She notes she is still smoking 1.5 pack every 2 days.     REVIEW OF SYSTEMS:   Constitutional: Denies fevers, chills or abnormal weight loss Eyes: Denies blurriness of vision Ears, nose, mouth, throat, and face: Denies mucositis or sore throat (+) Occasional epistaxis.  Respiratory: Denies cough, dyspnea or wheezes Cardiovascular: Denies palpitation, chest discomfort or lower extremity swelling Gastrointestinal:  Denies nausea, heartburn or change in bowel habits Skin: Denies abnormal skin rashes Lymphatics: Denies new lymphadenopathy or easy bruising Neurological:Denies new weaknesses (+) Neuropathy worsened, upper left leg pain  Behavioral/Psych: Mood is stable, no new changes  All other systems were reviewed with the patient and are  negative.  MEDICAL HISTORY:  Past Medical History:  Diagnosis Date  . Acute renal failure ([place])   . Arthritis   . Diabetes mellitus without complication ([place])    type II   . Hypercalcemia   . Hypertension   . Renal disorder   . Single kidney   . Thrombosis     SURGICAL HISTORY: Past Surgical History:  Procedure Laterality Date  . ABDOMINAL HYSTERECTOMY    . blood clots removed from descending aorta     . CHOLECYSTECTOMY    . COLONOSCOPY    . COLONOSCOPY WITH PROPOFOL N/A 04/17/2017   Procedure: COLONOSCOPY WITH PROPOFOL;  Surgeon: Wilford Corner, MD;  Location: [facility];  Service: Endoscopy;  Laterality: N/A;  . ESOPHAGOGASTRODUODENOSCOPY (EGD) WITH PROPOFOL N/A 04/17/2017   Procedure: ESOPHAGOGASTRODUODENOSCOPY (EGD) WITH PROPOFOL;  Surgeon: Wilford Corner, MD;  Location: [facility];  Service: Endoscopy;  Laterality: N/A;  . NEPHRECTOMY RECIPIENT      I have reviewed the social history and family history with the patient and they are unchanged from previous note.  ALLERGIES:  is allergic to contrast media [iodinated diagnostic agents].  MEDICATIONS:  Current Outpatient Medications  Medication Sig Dispense Refill  . amLODipine (NORVASC) 5 MG tablet Take 1 tablet (5 mg total) by mouth daily. 30 tablet 0  . cholecalciferol (VITAMIN D) 1000 units tablet Take 1 tablet (1,000 Units total) by mouth once a week.    . ciclopirox (PENLAC) 8 % solution Apply topically at bedtime. Apply over nail and surrounding skin. Apply daily over previous coat. After seven (7) days, may remove with alcohol  and continue cycle. 6.6 mL 11  . Dulaglutide (TRULICITY [place]) Inject into the skin.    Marland Kitchen gabapentin (NEURONTIN) 600 MG tablet Take 600 mg by mouth 4 (four) times daily.     . hydrALAZINE (APRESOLINE) 25 MG tablet Take 25 mg by mouth 3 (three) times daily.    . Insulin Glargine (TOUJEO SOLOSTAR) 300 UNIT/ML SOPN Inject 40 Units into the skin at bedtime.     . insulin lispro (HUMALOG  KWIKPEN) 100 UNIT/ML KiwkPen Inject 22-24 Units into the skin daily as needed. Inject 24 units with breakfast, 22 units with lunch, and 24 units with dinner    . Ipratropium-Albuterol (COMBIVENT) 20-100 MCG/ACT AERS respimat Inhale 1 puff into the lungs every 6 (six) hours as needed for wheezing or shortness of breath. 1 Inhaler 0  . levothyroxine (SYNTHROID, LEVOTHROID) 25 MCG tablet Take 75 mcg by mouth daily before breakfast.     . metoprolol (LOPRESSOR) 50 MG tablet Take 50 mg by mouth 2 (two) times daily.     . nicotine (NICODERM CQ - DOSED IN MG/24 HOURS) 21 mg/24hr patch Place 1 patch (21 mg total) onto the skin daily. 28 patch 0  . oxyCODONE (XTAMPZA ER PO) Take by mouth. Unsure of dose    . oxyCODONE-acetaminophen (PERCOCET) 10-325 MG tablet Take 1 tablet by mouth every 6 (six) hours as needed for pain.    . pantoprazole (PROTONIX) 40 MG tablet Take 40 mg by mouth daily.    . polyethylene glycol (MIRALAX / GLYCOLAX) packet Take 17 g by mouth daily. 30 each 3  . PRALUENT 75 MG/ML SOPN Inject 75 mg as directed every 14 (fourteen) days.  4   No current facility-administered medications for this visit.     PHYSICAL EXAMINATION: ECOG PERFORMANCE STATUS: 1 - Symptomatic but completely ambulatory  Vitals:   07/06/18 1458 07/06/18 1521  BP: (!) 151/62 (!) 141/57  Pulse: 74   Resp: 18   Temp: 98.3 F (36.8 C)   SpO2: 96%    Filed Weights   07/06/18 1458  Weight: 193 lb (87.5 kg)    GENERAL:alert, no distress and comfortable SKIN: skin color, texture, turgor are normal, no rashes or significant lesions EYES: normal, Conjunctiva are pink and non-injected, sclera clear OROPHARYNX:no exudate, no erythema and lips, buccal mucosa, and tongue normal  NECK: supple, thyroid normal size, non-tender, without nodularity LYMPH:  no palpable lymphadenopathy in the cervical, axillary or inguinal LUNGS: clear to auscultation and percussion with normal breathing effort HEART: regular rate &  rhythm and no murmurs and no lower extremity edema ABDOMEN:abdomen soft, non-tender and normal bowel sounds Musculoskeletal:no cyanosis of digits and no clubbing  NEURO: alert & oriented x 3 with fluent speech, no focal motor/sensory deficits  LABORATORY DATA:  I have reviewed the data as listed CBC Latest Ref Rng & Units 07/06/2018 06/08/2018 05/25/2018  WBC 4.0 - 10.5 K/uL 7.3 8.2 8.4  Hemoglobin 12.0 - 15.0 g/dL 11.5(L) 11.2(L) 12.4  Hematocrit 36.0 - 46.0 % 38.3 38.0 41.5  Platelets 150 - 400 K/uL 316 262 313     CMP Latest Ref Rng & Units 11/07/2017 10/13/2017 09/08/2017  Glucose 70 - 99 mg/dL 130(H) 143(H) 334(H)  BUN 8 - 23 mg/dL 21 23 29(H)  Creatinine 0.44 - 1.00 mg/dL 1.18(H) 1.35(H) 1.39(H)  Sodium 135 - 145 mmol/L 139 137 136  Potassium 3.5 - 5.1 mmol/L 4.9 4.6 4.9  Chloride 98 - 111 mmol/L 104 103 104  CO2 22 - 32 mmol/L 24  26 23  Calcium 8.9 - 10.3 mg/dL 10.6(H) 10.7(H) 11.0(H)  Total Protein 6.5 - 8.1 g/dL 7.1 7.5 7.3  Total Bilirubin 0.3 - 1.2 mg/dL 0.2(L) 0.3 <0.2(L)  Alkaline Phos 38 - 126 U/L 121 158(H) 125  AST 15 - 41 U/L 27 54(H) 13  ALT 0 - 44 U/L 28 69(H) 19      RADIOGRAPHIC STUDIES: I have personally reviewed the radiological images as listed and agreed with the findings in the report. No results found.   ASSESSMENT & PLAN:  TAHNI PORCHIA is a 68 y.o. [name] with   1. 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 She has required blood transfusions in the past in 2013 and in 2018 and frequent hospital admission for anemia. -She has intermittent epistaxis, but no skin or mucosa telangiectasia, no significant family history of bleeding disorder, she does not meet the diagnosis criteria for hereditary hemorrhagic telangiectasia (HHT).   -She is not on oral iron, due to the lack of benefit.  -Currently being treated  with IV Iron to prevent significant anemia with goal of Ferritin 100-200 range. Will monitor with monthly labs and IV iron as needed. If she develops interim bleeding she should come in for lab and IV iron or blood if needed. -Labs reviewed, CBC WNL except mild anemia with Hg at 11.5. Iron panel still pending.  -She has mild anemia both her time, even with normal iron, she may have component of anemia of chronic disease, secondary to CKD and diabetes -I discussed increasing iron in diet.  -Continue labs every 4 weeks and F/u in 16 weeks.   2. Chronic GI Bleeding, intermittent epistaxis -Has intestinal AVM history  -Had colonoscopy/EGD by Dr. Michail Sermon on 04/17/17 which showed gastritis, diverticulosis and internal hemorrhoids, all benign with no obvious signs of bleeding at that time.  -She does not meet the diagnosis criteria for hereditary hemorrhagic telangiectasia (HHT)  3. H/o of arterial thrombosis -S/p unilateral nephrectomy due to damage from thrombosis -Managed by vascular physician, Dr. Doren Custard  -patient does not currently receive therapeutic anticoagulation due to history of severe GI bleeding. -Previous work-up for lupus anticoagulant and antiphospholipid syndrome were negative.  4. Smoking Cessation -Currently smokes 1.5 packs over 2 days currently. She has been smoking for 40 years.  -I again encouraged her to continue to try to quit completely, she understand and is willing to try.   5. COPD, HTN, CKD stage III -Only has 1 kidney. Will check her kidney function every 6 months  -f/u with PCP  -BP at 151/62 today (07/06/18), I encourage her to monitor at home   6. DM with Peripheral Neuropathy  -She notes her neuropathy has gotten worse and has been experienced upper left leg pain from this.  -She is currently on 600mg  Gabapentin 4 times a day.  -After recent visit with Dr. Deforest Hoyles she has Hb A1c of 4.8. Her insulin has been reduced. I disucssed she should watch for  hypoglycemia.   PLAN:  Iv feraheme in one week Lab every 4 weeksX4 F/u in 16 weeks  Will set up iv feraheme if ferritin<100 or low serum iron/saturation with anemia    No problem-specific Assessment & Plan notes found for this encounter.   No orders of the defined types were placed in this encounter.  All questions were answered. The patient knows to  call the clinic with any problems, questions or concerns. No barriers to learning was detected. I spent 20 minutes counseling the patient face to face. The total time spent in the appointment was 25 minutes and more than 50% was on counseling and review of test results     Truitt Merle, MD 07/06/2018   I, Joslyn Devon, am acting as scribe for Truitt Merle, MD.   I have reviewed the above documentation for accuracy and completeness, and I agree with the above.

## 2018-07-06 ENCOUNTER — Encounter: Payer: Self-pay | Admitting: Hematology

## 2018-07-06 ENCOUNTER — Inpatient Hospital Stay (HOSPITAL_BASED_OUTPATIENT_CLINIC_OR_DEPARTMENT_OTHER): Payer: Medicare Other | Admitting: Hematology

## 2018-07-06 ENCOUNTER — Telehealth: Payer: Self-pay | Admitting: Hematology

## 2018-07-06 ENCOUNTER — Inpatient Hospital Stay: Payer: Medicare Other | Attending: Hematology and Oncology

## 2018-07-06 VITALS — BP 141/57 | HR 74 | Temp 98.3°F | Resp 18 | Ht 65.0 in | Wt 193.0 lb

## 2018-07-06 DIAGNOSIS — E1122 Type 2 diabetes mellitus with diabetic chronic kidney disease: Secondary | ICD-10-CM | POA: Diagnosis not present

## 2018-07-06 DIAGNOSIS — Q2733 Arteriovenous malformation of digestive system vessel: Secondary | ICD-10-CM | POA: Diagnosis present

## 2018-07-06 DIAGNOSIS — N183 Chronic kidney disease, stage 3 (moderate): Secondary | ICD-10-CM | POA: Diagnosis not present

## 2018-07-06 DIAGNOSIS — D500 Iron deficiency anemia secondary to blood loss (chronic): Secondary | ICD-10-CM | POA: Diagnosis not present

## 2018-07-06 DIAGNOSIS — I129 Hypertensive chronic kidney disease with stage 1 through stage 4 chronic kidney disease, or unspecified chronic kidney disease: Secondary | ICD-10-CM | POA: Diagnosis not present

## 2018-07-06 DIAGNOSIS — J449 Chronic obstructive pulmonary disease, unspecified: Secondary | ICD-10-CM | POA: Insufficient documentation

## 2018-07-06 LAB — CBC WITH DIFFERENTIAL (CANCER CENTER ONLY)
Abs Immature Granulocytes: 0.03 10*3/uL (ref 0.00–0.07)
Basophils Absolute: 0 10*3/uL (ref 0.0–0.1)
Basophils Relative: 1 %
Eosinophils Absolute: 0.2 10*3/uL (ref 0.0–0.5)
Eosinophils Relative: 2 %
HCT: 38.3 % (ref 36.0–46.0)
Hemoglobin: 11.5 g/dL — ABNORMAL LOW (ref 12.0–15.0)
Immature Granulocytes: 0 %
Lymphocytes Relative: 17 %
Lymphs Abs: 1.2 10*3/uL (ref 0.7–4.0)
MCH: 24.5 pg — ABNORMAL LOW (ref 26.0–34.0)
MCHC: 30 g/dL (ref 30.0–36.0)
MCV: 81.7 fL (ref 80.0–100.0)
Monocytes Absolute: 0.5 10*3/uL (ref 0.1–1.0)
Monocytes Relative: 7 %
Neutro Abs: 5.4 10*3/uL (ref 1.7–7.7)
Neutrophils Relative %: 73 %
Platelet Count: 316 10*3/uL (ref 150–400)
RBC: 4.69 MIL/uL (ref 3.87–5.11)
RDW: 16.4 % — ABNORMAL HIGH (ref 11.5–15.5)
WBC Count: 7.3 10*3/uL (ref 4.0–10.5)
nRBC: 0 % (ref 0.0–0.2)

## 2018-07-06 LAB — IRON AND TIBC
Iron: 46 ug/dL (ref 41–142)
Saturation Ratios: 14 % — ABNORMAL LOW (ref 21–57)
TIBC: 335 ug/dL (ref 236–444)
UIBC: 289 ug/dL (ref 120–384)

## 2018-07-06 LAB — FERRITIN: Ferritin: 141 ng/mL (ref 11–307)

## 2018-07-06 NOTE — Addendum Note (Signed)
Addended by: Truitt Merle on: 07/06/2018 06:15 PM   Modules accepted: Orders

## 2018-07-06 NOTE — Telephone Encounter (Signed)
Gave avs and calendar ° °

## 2018-07-10 ENCOUNTER — Ambulatory Visit (HOSPITAL_COMMUNITY)
Admission: RE | Admit: 2018-07-10 | Discharge: 2018-07-10 | Disposition: A | Discharge status: Home / Self Care | Payer: Medicare Other | Source: Ambulatory Visit | Attending: Hematology | Admitting: Hematology

## 2018-07-10 DIAGNOSIS — D509 Iron deficiency anemia, unspecified: Secondary | ICD-10-CM | POA: Diagnosis not present

## 2018-07-10 MED ORDER — SODIUM CHLORIDE 0.9 % IV SOLN
510.0000 mg | Freq: Once | INTRAVENOUS | Status: AC
  Administered 2018-07-10: 510 mg via INTRAVENOUS
  Filled 2018-07-10: qty 17

## 2018-07-10 MED ORDER — SODIUM CHLORIDE 0.9 % IV SOLN
Freq: Once | INTRAVENOUS | Status: AC
  Administered 2018-07-10: 11:00:00 via INTRAVENOUS

## 2018-07-10 NOTE — Discharge Instructions (Signed)

## 2018-07-10 NOTE — Progress Notes (Signed)
PATIENT CARE CENTER NOTE  Diagnosis: Iron Deficiency Anemia   Provider: Truitt Merle, MD   Procedure: IV Feraheme    Note: Patient received Feraheme infusion. Tolerated well with no adverse reaction. Observed patient for 30 minutes post-infusion. Vital signs WNL for patient. Discharge instructions given. Patient to come back next week for second Feraheme infusion. Alert, oriented and ambulatory at discharge.

## 2018-07-17 ENCOUNTER — Ambulatory Visit (HOSPITAL_COMMUNITY)
Admission: RE | Admit: 2018-07-17 | Discharge: 2018-07-17 | Disposition: A | Discharge status: Home / Self Care | Payer: Medicare Other | Source: Ambulatory Visit | Attending: Hematology | Admitting: Hematology

## 2018-07-17 DIAGNOSIS — D509 Iron deficiency anemia, unspecified: Secondary | ICD-10-CM | POA: Diagnosis present

## 2018-07-17 MED ORDER — SODIUM CHLORIDE 0.9 % IV SOLN
510.0000 mg | Freq: Once | INTRAVENOUS | Status: AC
  Administered 2018-07-17: 510 mg via INTRAVENOUS
  Filled 2018-07-17: qty 17

## 2018-07-17 MED ORDER — SODIUM CHLORIDE 0.9 % IV SOLN
Freq: Once | INTRAVENOUS | Status: AC
  Administered 2018-07-17: 10:00:00 via INTRAVENOUS

## 2018-07-17 NOTE — Progress Notes (Addendum)
PATIENT CARE CENTER NOTE  Diagnosis: Iron Deficiency Anemia   Provider: Truitt Merle, MD   Procedure: IV Feraheme    Note: Patient received Feraheme infusion. Tolerated well with no adverse reaction. Observed patient for 30 minutes post-infusion. Blood pressure elevated at discharge. Patient had just taken prescribed BP medications and patient advised to continue monitoring BP at home. Discharge instructions given.  Alert, oriented and ambulatory at discharge.

## 2018-07-17 NOTE — Discharge Instructions (Signed)

## 2018-07-19 ENCOUNTER — Ambulatory Visit
Admission: RE | Admit: 2018-07-19 | Discharge: 2018-07-19 | Disposition: A | Discharge status: Home / Self Care | Payer: Medicare Other | Source: Ambulatory Visit | Attending: Internal Medicine | Admitting: Internal Medicine

## 2018-07-19 DIAGNOSIS — Z1231 Encounter for screening mammogram for malignant neoplasm of breast: Secondary | ICD-10-CM

## 2018-08-03 ENCOUNTER — Other Ambulatory Visit: Payer: Self-pay

## 2018-08-03 ENCOUNTER — Inpatient Hospital Stay: Payer: Medicare Other | Attending: Hematology and Oncology

## 2018-08-03 DIAGNOSIS — D500 Iron deficiency anemia secondary to blood loss (chronic): Secondary | ICD-10-CM | POA: Diagnosis present

## 2018-08-03 DIAGNOSIS — Q2733 Arteriovenous malformation of digestive system vessel: Secondary | ICD-10-CM | POA: Diagnosis present

## 2018-08-03 LAB — IRON AND TIBC
Iron: 95 ug/dL (ref 41–142)
Saturation Ratios: 32 % (ref 21–57)
TIBC: 300 ug/dL (ref 236–444)
UIBC: 205 ug/dL (ref 120–384)

## 2018-08-03 LAB — CBC WITH DIFFERENTIAL (CANCER CENTER ONLY)
Abs Immature Granulocytes: 0.09 10*3/uL — ABNORMAL HIGH (ref 0.00–0.07)
Basophils Absolute: 0 10*3/uL (ref 0.0–0.1)
Basophils Relative: 1 %
Eosinophils Absolute: 0.2 10*3/uL (ref 0.0–0.5)
Eosinophils Relative: 2 %
HCT: 37.4 % (ref 36.0–46.0)
Hemoglobin: 11.2 g/dL — ABNORMAL LOW (ref 12.0–15.0)
Immature Granulocytes: 1 %
Lymphocytes Relative: 12 %
Lymphs Abs: 1 10*3/uL (ref 0.7–4.0)
MCH: 24.7 pg — ABNORMAL LOW (ref 26.0–34.0)
MCHC: 29.9 g/dL — ABNORMAL LOW (ref 30.0–36.0)
MCV: 82.4 fL (ref 80.0–100.0)
Monocytes Absolute: 0.6 10*3/uL (ref 0.1–1.0)
Monocytes Relative: 6 %
Neutro Abs: 6.7 10*3/uL (ref 1.7–7.7)
Neutrophils Relative %: 78 %
Platelet Count: 291 10*3/uL (ref 150–400)
RBC: 4.54 MIL/uL (ref 3.87–5.11)
RDW: 16.7 % — ABNORMAL HIGH (ref 11.5–15.5)
WBC Count: 8.6 10*3/uL (ref 4.0–10.5)
nRBC: 0.2 % (ref 0.0–0.2)

## 2018-08-03 LAB — CMP (CANCER CENTER ONLY)
ALT: 36 U/L (ref 0–44)
AST: 23 U/L (ref 15–41)
Albumin: 3.7 g/dL (ref 3.5–5.0)
Alkaline Phosphatase: 166 U/L — ABNORMAL HIGH (ref 38–126)
Anion gap: 11 (ref 5–15)
BUN: 16 mg/dL (ref 8–23)
CO2: 23 mmol/L (ref 22–32)
Calcium: 9.9 mg/dL (ref 8.9–10.3)
Chloride: 103 mmol/L (ref 98–111)
Creatinine: 1.27 mg/dL — ABNORMAL HIGH (ref 0.44–1.00)
GFR, Est AFR Am: 50 mL/min — ABNORMAL LOW (ref >60)
GFR, Estimated: 43 mL/min — ABNORMAL LOW (ref >60)
Glucose, Bld: 161 mg/dL — ABNORMAL HIGH (ref 70–99)
Potassium: 4 mmol/L (ref 3.5–5.1)
Sodium: 137 mmol/L (ref 135–145)
Total Bilirubin: 0.2 mg/dL — ABNORMAL LOW (ref 0.3–1.2)
Total Protein: 7 g/dL (ref 6.5–8.1)

## 2018-08-03 LAB — FERRITIN: Ferritin: 319 ng/mL — ABNORMAL HIGH (ref 11–307)

## 2018-08-06 ENCOUNTER — Telehealth: Payer: Self-pay

## 2018-08-06 NOTE — Telephone Encounter (Signed)
Spoke with patient regarding lab results, per Dr. Burr Medico iron level is adequate, no need for Iv iron right now, patient verbalized an understanding and was appreciative of the call.

## 2018-08-06 NOTE — Telephone Encounter (Signed)
-----   Message from Truitt Merle, MD sent at 08/03/2018  4:01 PM EDT ----- Please let pt know her lab results, iron level adequate, no need iv iron, thanks   Truitt Merle

## 2018-08-31 ENCOUNTER — Other Ambulatory Visit: Payer: Self-pay

## 2018-08-31 ENCOUNTER — Inpatient Hospital Stay: Payer: Medicare Other | Attending: Hematology and Oncology

## 2018-08-31 DIAGNOSIS — D631 Anemia in chronic kidney disease: Secondary | ICD-10-CM | POA: Diagnosis present

## 2018-08-31 DIAGNOSIS — N183 Chronic kidney disease, stage 3 (moderate): Secondary | ICD-10-CM | POA: Diagnosis present

## 2018-08-31 DIAGNOSIS — D500 Iron deficiency anemia secondary to blood loss (chronic): Secondary | ICD-10-CM

## 2018-08-31 DIAGNOSIS — E611 Iron deficiency: Secondary | ICD-10-CM | POA: Diagnosis present

## 2018-08-31 LAB — CBC WITH DIFFERENTIAL (CANCER CENTER ONLY)
Abs Immature Granulocytes: 0.05 10*3/uL (ref 0.00–0.07)
Basophils Absolute: 0 10*3/uL (ref 0.0–0.1)
Basophils Relative: 0 %
Eosinophils Absolute: 0.1 10*3/uL (ref 0.0–0.5)
Eosinophils Relative: 2 %
HCT: 37.7 % (ref 36.0–46.0)
Hemoglobin: 11.2 g/dL — ABNORMAL LOW (ref 12.0–15.0)
Immature Granulocytes: 1 %
Lymphocytes Relative: 15 %
Lymphs Abs: 1.2 10*3/uL (ref 0.7–4.0)
MCH: 24.7 pg — ABNORMAL LOW (ref 26.0–34.0)
MCHC: 29.7 g/dL — ABNORMAL LOW (ref 30.0–36.0)
MCV: 83 fL (ref 80.0–100.0)
Monocytes Absolute: 0.4 10*3/uL (ref 0.1–1.0)
Monocytes Relative: 5 %
Neutro Abs: 6.1 10*3/uL (ref 1.7–7.7)
Neutrophils Relative %: 77 %
Platelet Count: 303 10*3/uL (ref 150–400)
RBC: 4.54 MIL/uL (ref 3.87–5.11)
RDW: 16.2 % — ABNORMAL HIGH (ref 11.5–15.5)
WBC Count: 8 10*3/uL (ref 4.0–10.5)
nRBC: 0.4 % — ABNORMAL HIGH (ref 0.0–0.2)

## 2018-08-31 LAB — IRON AND TIBC
Iron: 40 ug/dL — ABNORMAL LOW (ref 41–142)
Saturation Ratios: 12 % — ABNORMAL LOW (ref 21–57)
TIBC: 340 ug/dL (ref 236–444)
UIBC: 300 ug/dL (ref 120–384)

## 2018-08-31 LAB — FERRITIN: Ferritin: 90 ng/mL (ref 11–307)

## 2018-09-04 ENCOUNTER — Telehealth: Payer: Self-pay | Admitting: Hematology

## 2018-09-04 ENCOUNTER — Telehealth: Payer: Self-pay | Admitting: *Deleted

## 2018-09-04 NOTE — Telephone Encounter (Signed)
-----   Message from Truitt Merle, MD sent at 09/04/2018 11:43 AM EDT ----- Please let pt know her lab results, iron level mildly low, I will set up iv iron once for her, thanks   Truitt Merle  09/04/2018

## 2018-09-04 NOTE — Telephone Encounter (Signed)
Called pt and informed her of lab iron level midly low.  Dr. Burr Medico will set up IV iron once for pt.  Pt understood she will be contacted for appt date and time.

## 2018-09-04 NOTE — Telephone Encounter (Signed)
Scheduled iv iron per 4/21 sch message- unable to reach patient . Left message with appt date and time

## 2018-09-12 ENCOUNTER — Inpatient Hospital Stay: Payer: Medicare Other

## 2018-09-12 ENCOUNTER — Other Ambulatory Visit: Payer: Self-pay | Admitting: Nurse Practitioner

## 2018-09-12 ENCOUNTER — Other Ambulatory Visit: Payer: Self-pay

## 2018-09-12 VITALS — BP 145/58 | HR 79 | Temp 98.8°F | Resp 16

## 2018-09-12 DIAGNOSIS — K5520 Angiodysplasia of colon without hemorrhage: Secondary | ICD-10-CM

## 2018-09-12 DIAGNOSIS — N183 Chronic kidney disease, stage 3 (moderate): Secondary | ICD-10-CM | POA: Diagnosis not present

## 2018-09-12 DIAGNOSIS — D500 Iron deficiency anemia secondary to blood loss (chronic): Secondary | ICD-10-CM

## 2018-09-12 MED ORDER — SODIUM CHLORIDE 0.9 % IV SOLN
Freq: Once | INTRAVENOUS | Status: AC
  Administered 2018-09-12: 16:00:00 via INTRAVENOUS
  Filled 2018-09-12: qty 250

## 2018-09-12 MED ORDER — SODIUM CHLORIDE 0.9 % IV SOLN
510.0000 mg | Freq: Once | INTRAVENOUS | Status: AC
  Administered 2018-09-12: 510 mg via INTRAVENOUS
  Filled 2018-09-12: qty 17

## 2018-09-12 NOTE — Progress Notes (Signed)
Pt refused to stay for 30 min observation post infusion.  VSS.

## 2018-09-12 NOTE — Patient Instructions (Signed)

## 2018-09-28 ENCOUNTER — Inpatient Hospital Stay: Payer: Medicare Other | Attending: Hematology

## 2018-09-28 ENCOUNTER — Other Ambulatory Visit: Payer: Self-pay

## 2018-09-28 DIAGNOSIS — E1142 Type 2 diabetes mellitus with diabetic polyneuropathy: Secondary | ICD-10-CM | POA: Diagnosis not present

## 2018-09-28 DIAGNOSIS — Q2733 Arteriovenous malformation of digestive system vessel: Secondary | ICD-10-CM | POA: Diagnosis present

## 2018-09-28 DIAGNOSIS — J449 Chronic obstructive pulmonary disease, unspecified: Secondary | ICD-10-CM | POA: Diagnosis not present

## 2018-09-28 DIAGNOSIS — D500 Iron deficiency anemia secondary to blood loss (chronic): Secondary | ICD-10-CM | POA: Diagnosis present

## 2018-09-28 DIAGNOSIS — Z79899 Other long term (current) drug therapy: Secondary | ICD-10-CM | POA: Insufficient documentation

## 2018-09-28 DIAGNOSIS — I10 Essential (primary) hypertension: Secondary | ICD-10-CM | POA: Diagnosis not present

## 2018-09-28 DIAGNOSIS — Z794 Long term (current) use of insulin: Secondary | ICD-10-CM | POA: Insufficient documentation

## 2018-09-28 DIAGNOSIS — Z7951 Long term (current) use of inhaled steroids: Secondary | ICD-10-CM | POA: Insufficient documentation

## 2018-09-28 DIAGNOSIS — N183 Chronic kidney disease, stage 3 (moderate): Secondary | ICD-10-CM | POA: Diagnosis not present

## 2018-09-28 LAB — IRON AND TIBC
Iron: 43 ug/dL (ref 41–142)
Saturation Ratios: 13 % — ABNORMAL LOW (ref 21–57)
TIBC: 346 ug/dL (ref 236–444)
UIBC: 303 ug/dL (ref 120–384)

## 2018-09-28 LAB — CBC WITH DIFFERENTIAL (CANCER CENTER ONLY)
Abs Immature Granulocytes: 0.09 10*3/uL — ABNORMAL HIGH (ref 0.00–0.07)
Basophils Absolute: 0.1 10*3/uL (ref 0.0–0.1)
Basophils Relative: 1 %
Eosinophils Absolute: 0.2 10*3/uL (ref 0.0–0.5)
Eosinophils Relative: 2 %
HCT: 31.2 % — ABNORMAL LOW (ref 36.0–46.0)
Hemoglobin: 8.9 g/dL — ABNORMAL LOW (ref 12.0–15.0)
Immature Granulocytes: 1 %
Lymphocytes Relative: 10 %
Lymphs Abs: 1.1 10*3/uL (ref 0.7–4.0)
MCH: 24.6 pg — ABNORMAL LOW (ref 26.0–34.0)
MCHC: 28.5 g/dL — ABNORMAL LOW (ref 30.0–36.0)
MCV: 86.2 fL (ref 80.0–100.0)
Monocytes Absolute: 0.6 10*3/uL (ref 0.1–1.0)
Monocytes Relative: 5 %
Neutro Abs: 8.7 10*3/uL — ABNORMAL HIGH (ref 1.7–7.7)
Neutrophils Relative %: 81 %
Platelet Count: 338 10*3/uL (ref 150–400)
RBC: 3.62 MIL/uL — ABNORMAL LOW (ref 3.87–5.11)
RDW: 18 % — ABNORMAL HIGH (ref 11.5–15.5)
WBC Count: 10.6 10*3/uL — ABNORMAL HIGH (ref 4.0–10.5)
nRBC: 0.4 % — ABNORMAL HIGH (ref 0.0–0.2)

## 2018-09-28 LAB — FERRITIN: Ferritin: 171 ng/mL (ref 11–307)

## 2018-10-01 ENCOUNTER — Telehealth: Payer: Self-pay | Admitting: *Deleted

## 2018-10-01 ENCOUNTER — Telehealth: Payer: Self-pay | Admitting: Hematology

## 2018-10-01 NOTE — Telephone Encounter (Signed)
TCT patient regarding her lab results from 09/28/18. Spoke with patient. Informed her of lower HGB but stable iron levels. Inquired of patient any recent change in bowel habits, blood in stool, urine or elsewhere.  Pt states she is having black tarry stools lately. She states she had a colonoscopy and endoscopy in Nov or Dec of 2019, that were 'normal'.   She sees Dr. Michail Sermon of [facility].  Informed patient that Dr. Burr Medico has ordered one dose of IV iron and that a scheduler will call this week with date and time.. Pt has f/u appt with Dr. Burr Medico on 10/26/18

## 2018-10-01 NOTE — Telephone Encounter (Signed)
Beth, thanks much for taking care of this.   Truitt Merle MD

## 2018-10-01 NOTE — Telephone Encounter (Signed)
Please let pt know he needs to see Dr. Michail Sermon due to her GI bleeding (anemia and black tarry stools), make sure her iv iron is schedule this week, and move up her lab and f/u to next week 5/26, thanks   Truitt Merle MD

## 2018-10-01 NOTE — Telephone Encounter (Signed)
Returned call to patient. Informed that Dr. Burr Medico is requesting pt follow up with Dr. Michail Sermon re: black, tarry stools. Also pt is to receive IV fereheme this week and move up her appt for labs and Dr. Burr Medico from 10/26/18 to 10/09/18  Scheduling request sent. Labs forwarded to Dr. Michail Sermon with request for an appt as soon as possible.

## 2018-10-01 NOTE — Telephone Encounter (Signed)
Scheduled appt per 5/18 sch message - unable to reach patient . Left message with appt date and time

## 2018-10-01 NOTE — Telephone Encounter (Signed)
-----   Message from Truitt Merle, MD sent at 09/29/2018  6:44 PM EDT ----- Please let pt know her anemia is slightly worse yesterday, check if she has overt bleeding. Iron level not bad, but given her worsening anemia, I will set up one dose iv iron, thanks  Truitt Merle  09/29/2018

## 2018-10-05 ENCOUNTER — Other Ambulatory Visit: Payer: Self-pay

## 2018-10-05 ENCOUNTER — Encounter: Payer: Self-pay | Admitting: Hematology and Oncology

## 2018-10-05 ENCOUNTER — Other Ambulatory Visit: Payer: Self-pay | Admitting: Internal Medicine

## 2018-10-05 ENCOUNTER — Inpatient Hospital Stay: Payer: Medicare Other

## 2018-10-05 VITALS — BP 118/58 | HR 60 | Temp 99.1°F | Resp 16

## 2018-10-05 DIAGNOSIS — D500 Iron deficiency anemia secondary to blood loss (chronic): Secondary | ICD-10-CM

## 2018-10-05 DIAGNOSIS — K5520 Angiodysplasia of colon without hemorrhage: Secondary | ICD-10-CM

## 2018-10-05 MED ORDER — SODIUM CHLORIDE 0.9% FLUSH
3.0000 mL | Freq: Once | INTRAVENOUS | Status: DC | PRN
  Filled 2018-10-05: qty 10

## 2018-10-05 MED ORDER — SODIUM CHLORIDE 0.9 % IV SOLN
510.0000 mg | Freq: Once | INTRAVENOUS | Status: AC
  Administered 2018-10-05: 510 mg via INTRAVENOUS
  Filled 2018-10-05: qty 17

## 2018-10-05 MED ORDER — HEPARIN SOD (PORK) LOCK FLUSH 100 UNIT/ML IV SOLN
500.0000 [IU] | Freq: Once | INTRAVENOUS | Status: DC | PRN
  Filled 2018-10-05: qty 5

## 2018-10-05 MED ORDER — ALTEPLASE 2 MG IJ SOLR
2.0000 mg | Freq: Once | INTRAMUSCULAR | Status: DC | PRN
  Filled 2018-10-05: qty 2

## 2018-10-05 MED ORDER — SODIUM CHLORIDE 0.9 % IV SOLN
Freq: Once | INTRAVENOUS | Status: AC
  Administered 2018-10-05: 15:00:00 via INTRAVENOUS
  Filled 2018-10-05: qty 250

## 2018-10-05 MED ORDER — SODIUM CHLORIDE 0.9% FLUSH
10.0000 mL | Freq: Once | INTRAVENOUS | Status: DC | PRN
  Filled 2018-10-05: qty 10

## 2018-10-05 MED ORDER — HEPARIN SOD (PORK) LOCK FLUSH 100 UNIT/ML IV SOLN
250.0000 [IU] | Freq: Once | INTRAVENOUS | Status: DC | PRN
  Filled 2018-10-05: qty 5

## 2018-10-05 NOTE — Patient Instructions (Signed)

## 2018-10-05 NOTE — Progress Notes (Signed)
[facility]   Telephone:([phone]) [phone] [phone]) [phone]   Clinic Follow up Note   Patient Care Team: Wenda Low, MD as PCP - General (Internal Medicine) Truitt Merle, MD as Consulting Physician (Hematology) Wilford Corner, MD as Consulting Physician (Gastroenterology) Angelia Mould, MD as Consulting Physician (Vascular Surgery)  Date of Service:  10/09/2018  CHIEF COMPLAINT: F/u of Anemia    CURRENT THERAPY:  iv Feraheme as needed (if ferritin <100)  INTERVAL HISTORY:  Bethany [name] is here for a follow up of anemia. She is here alone. She notes she is fair. She notes she has black or dark brown stool occasionally but does not think it is GI bleeding. She has mostly solid stool with her BMs. Sometimes it will be looser formed. She denies seeing blood in her stool. Sometimes she will have BM several times a day. Last episode was 3 days ago.  She notes she has been more fatigue recently. She notes she saw Dr Michail Sermon recently. He did not do rectal exam.  She wonders is she fine to attend a family baby shower.     REVIEW OF SYSTEMS:   Constitutional: Denies fevers, chills or abnormal weight loss (+) fatigue  Eyes: Denies blurriness of vision Ears, nose, mouth, throat, and face: Denies mucositis or sore throat Respiratory: Denies cough, dyspnea or wheezes Cardiovascular: Denies palpitation, chest discomfort or lower extremity swelling Gastrointestinal:  Denies nausea, heartburn (+) Intermittent black or dark stool, occasionally frequent stools Skin: Denies abnormal skin rashes Lymphatics: Denies new lymphadenopathy or easy bruising Neurological:Denies numbness, tingling or new weaknesses Behavioral/Psych: Mood is stable, no new changes  All other systems were reviewed with the patient and are negative.  MEDICAL HISTORY:  Past Medical History:  Diagnosis Date  . Acute renal failure ([place])   . Arthritis   . Diabetes mellitus without complication ([place])     type II   . Hypercalcemia   . Hypertension   . Renal disorder   . Single kidney   . Thrombosis     SURGICAL HISTORY: Past Surgical History:  Procedure Laterality Date  . ABDOMINAL HYSTERECTOMY    . blood clots removed from descending aorta     . CHOLECYSTECTOMY    . COLONOSCOPY    . COLONOSCOPY WITH PROPOFOL N/A 04/17/2017   Procedure: COLONOSCOPY WITH PROPOFOL;  Surgeon: Wilford Corner, MD;  Location: [facility];  Service: Endoscopy;  Laterality: N/A;  . ESOPHAGOGASTRODUODENOSCOPY (EGD) WITH PROPOFOL N/A 04/17/2017   Procedure: ESOPHAGOGASTRODUODENOSCOPY (EGD) WITH PROPOFOL;  Surgeon: Wilford Corner, MD;  Location: [facility];  Service: Endoscopy;  Laterality: N/A;  . NEPHRECTOMY RECIPIENT      I have reviewed the social history and family history with the patient and they are unchanged from previous note.  ALLERGIES:  is allergic to contrast media [iodinated diagnostic agents].  MEDICATIONS:  Current Outpatient Medications  Medication Sig Dispense Refill  . amLODipine (NORVASC) 5 MG tablet Take 1 tablet (5 mg total) by mouth daily. 30 tablet 0  . cholecalciferol (VITAMIN D) 1000 units tablet Take 1 tablet (1,000 Units total) by mouth once a week.    . ciclopirox (PENLAC) 8 % solution Apply topically at bedtime. Apply over nail and surrounding skin. Apply daily over previous coat. After seven (7) days, may remove with alcohol and continue cycle. 6.6 mL 11  . Dulaglutide (TRULICITY [place]) Inject into the skin.    Marland Kitchen gabapentin (NEURONTIN) 600 MG tablet Take 600 mg by mouth 4 (four) times daily.     Marland Kitchen  hydrALAZINE (APRESOLINE) 25 MG tablet Take 25 mg by mouth 3 (three) times daily.    . Insulin Glargine (TOUJEO SOLOSTAR) 300 UNIT/ML SOPN Inject 40 Units into the skin at bedtime.     . insulin lispro (HUMALOG KWIKPEN) 100 UNIT/ML KiwkPen Inject 22-24 Units into the skin daily as needed. Inject 24 units with breakfast, 22 units with lunch, and 24 units with dinner    .  Ipratropium-Albuterol (COMBIVENT) 20-100 MCG/ACT AERS respimat Inhale 1 puff into the lungs every 6 (six) hours as needed for wheezing or shortness of breath. 1 Inhaler 0  . levothyroxine (SYNTHROID, LEVOTHROID) 25 MCG tablet Take 75 mcg by mouth daily before breakfast.     . metoprolol (LOPRESSOR) 50 MG tablet Take 50 mg by mouth 2 (two) times daily.     . nicotine (NICODERM CQ - DOSED IN MG/24 HOURS) 21 mg/24hr patch Place 1 patch (21 mg total) onto the skin daily. 28 patch 0  . oxyCODONE (XTAMPZA ER PO) Take by mouth. Unsure of dose    . oxyCODONE-acetaminophen (PERCOCET) 10-325 MG tablet Take 1 tablet by mouth every 6 (six) hours as needed for pain.    . pantoprazole (PROTONIX) 40 MG tablet Take 40 mg by mouth daily.    . polyethylene glycol (MIRALAX / GLYCOLAX) packet Take 17 g by mouth daily. 30 each 3  . PRALUENT 75 MG/ML SOPN Inject 75 mg as directed every 14 (fourteen) days.  4   No current facility-administered medications for this visit.     PHYSICAL EXAMINATION: ECOG PERFORMANCE STATUS: 1 - Symptomatic but completely ambulatory  Vitals:   10/09/18 1138 10/09/18 1140  BP: (!) 124/48 (!) 131/57  Pulse: 76   Resp: 18   Temp: 98.3 F (36.8 C)   SpO2: 100%    Filed Weights   10/09/18 1138  Weight: 190 lb 6.4 oz (86.4 kg)    GENERAL:alert, no distress and comfortable SKIN: skin color, texture, turgor are normal, no rashes or significant lesions EYES: normal, Conjunctiva are pink and non-injected, sclera clear NECK: supple, thyroid normal size, non-tender, without nodularity LYMPH:  no palpable lymphadenopathy in the cervical, axillary  LUNGS: clear to auscultation and percussion with normal breathing effort HEART: regular rate & rhythm and no murmurs and no lower extremity edema ABDOMEN:abdomen soft, non-tender and normal bowel sounds Musculoskeletal:no cyanosis of digits and no clubbing  NEURO: alert & oriented x 3 with fluent speech, no focal motor/sensory deficits   LABORATORY DATA:  I have reviewed the data as listed CBC Latest Ref Rng & Units 10/09/2018 09/28/2018 08/31/2018  WBC 4.0 - 10.5 K/uL 11.5(H) 10.6(H) 8.0  Hemoglobin 12.0 - 15.0 g/dL 9.4(L) 8.9(L) 11.2(L)  Hematocrit 36.0 - 46.0 % 33.2(L) 31.2(L) 37.7  Platelets 150 - 400 K/uL 438(H) 338 303     CMP Latest Ref Rng & Units 08/03/2018 11/07/2017 10/13/2017  Glucose 70 - 99 mg/dL 161(H) 130(H) 143(H)  BUN 8 - 23 mg/dL 16 21 23   Creatinine 0.44 - 1.00 mg/dL 1.27(H) 1.18(H) 1.35(H)  Sodium 135 - 145 mmol/L 137 139 137  Potassium 3.5 - 5.1 mmol/L 4.0 4.9 4.6  Chloride 98 - 111 mmol/L 103 104 103  CO2 22 - 32 mmol/L 23 24 26   Calcium 8.9 - 10.3 mg/dL 9.9 10.6(H) 10.7(H)  Total Protein 6.5 - 8.1 g/dL 7.0 7.1 7.5  Total Bilirubin 0.3 - 1.2 mg/dL 0.2(L) 0.2(L) 0.3  Alkaline Phos 38 - 126 U/L 166(H) 121 158(H)  AST 15 - 41 U/L 23 27  54(H)  ALT 0 - 44 U/L 36 28 69(H)      RADIOGRAPHIC STUDIES: I have personally reviewed the radiological images as listed and agreed with the findings in the report. No results found.   ASSESSMENT & PLAN:  Bethany [name] is a 68 y.o. female with   1. 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 She has required blood transfusions in the past in 2013 and in 2018 and frequent hospital admission for anemia. -She has intermittent epistaxis, but no skin or mucosa telangiectasia, no significant family history of bleeding disorder, she does not meet the diagnosis criteria for hereditary hemorrhagic telangiectasia (HHT).   -She is not on oral iron, due to the lack of benefit.  -Currently being treated with IV Iron to prevent significant anemiawith goal of Ferritin 100-200 range. Will monitor with monthly labs and IV iron as needed. If she develops interim bleeding she should come in for lab and IV iron or blood more frequently if  needed. -Hg 2 weeks ago dropped to 8.9 along with one day history of recent black/dark stool 3 weeks ago and fatigue. IV Feraheme was given last week. Her BM has been solid and normal except occasional dark stool lately  -Labs reviewed today, CBC WNL except WBC 11.5, Hg 9.4, PLT 438K, ANC 8.6. Iron panel is still pending. -she did see GI Dr. Michail Sermon but repeated endoscopy was not recommended -I again encourage her to monitor her BM at home, and contact me if she has multiple dark stool or rectal bleeding, or go to ED if severe bleeding  -I encouraged her to continue COVID-19 precautions and social distancing. I suggest if she must attend baby shower she should go early or towards the end.  -Will monitor her closer. Continue labs every 2 weeks and F/u in 2 months   2. Chronic GI Bleeding, intermittent epistaxis -Has intestinal AVM history  -Had colonoscopy/EGD by Dr. Michail Sermon on 04/17/17 which showed gastritis, diverticulosis and internal hemorrhoids, all benign with no obvious signs of bleeding at that time. -She does not meet the diagnosis criteria for hereditary hemorrhagic telangiectasia (HHT) -She has been having intermittent black stool, frequent BMs. I recommend she monitor her stool and energy level. If she develops significant bleeding she should go to ED. If moderate she should contact Dr Michail Sermon for scope.   3. H/o of arterial thrombosis -S/p unilateral nephrectomy due to damage from thrombosis -Managed by vascular physician, Dr. Doren Custard  -patient does not currently receive therapeutic anticoagulation due to history of severe GI bleeding. -Previous work-up for lupus anticoagulant and antiphospholipid syndrome were negative.  4. Smoking Cessation  -Currently smokes 1.5 packs over 2 days currently. She has been smoking for 40 years. -I again encouraged her to continue to try to quit completely, she understand and is willing to try.   5. COPD, HTN, CKD stage III -Only has 1  kidney. Will check her kidney function every 6 months  -f/u with PCP  -BP at 124/48 today (10/09/18), I encourage her to monitor at home   6. DM with Peripheral Neuropathy  -She notes her neuropathy has gotten worse and has been experienced upper left leg pain from this.  -She is currently on 600mg  Gabapentin 4 times a day.  -After recent visit with Dr. Deforest Hoyles she has Hb A1c of 4.8. Her insulin has been reduced.  I discussed she should watch for hypoglycemia.   PLAN:  Today's ferritin was 406, iron and TIBC normal, no need second dose feraheme for now  Lab every 2 weeks X4  F/u in 8 weeks    No problem-specific Assessment & Plan notes found for this encounter.   No orders of the defined types were placed in this encounter.  All questions were answered. The patient knows to call the clinic with any problems, questions or concerns. No barriers to learning was detected. I spent 15 minutes counseling the patient face to face. The total time spent in the appointment was 20 minutes and more than 50% was on counseling and review of test results     Truitt Merle, MD 10/09/2018   I, Joslyn Devon, am acting as scribe for Truitt Merle, MD.   I have reviewed the above documentation for accuracy and completeness, and I agree with the above.

## 2018-10-05 NOTE — Progress Notes (Signed)
Pt. Elected not to stay for 36min observation period.

## 2018-10-08 ENCOUNTER — Other Ambulatory Visit: Payer: Self-pay | Admitting: Hematology

## 2018-10-09 ENCOUNTER — Inpatient Hospital Stay (HOSPITAL_BASED_OUTPATIENT_CLINIC_OR_DEPARTMENT_OTHER): Payer: Medicare Other | Admitting: Hematology

## 2018-10-09 ENCOUNTER — Inpatient Hospital Stay: Payer: Medicare Other

## 2018-10-09 ENCOUNTER — Encounter: Payer: Self-pay | Admitting: Hematology

## 2018-10-09 ENCOUNTER — Other Ambulatory Visit: Payer: Self-pay

## 2018-10-09 VITALS — BP 131/57 | HR 76 | Temp 98.3°F | Resp 18 | Ht 65.0 in | Wt 190.4 lb

## 2018-10-09 DIAGNOSIS — Z794 Long term (current) use of insulin: Secondary | ICD-10-CM

## 2018-10-09 DIAGNOSIS — E1142 Type 2 diabetes mellitus with diabetic polyneuropathy: Secondary | ICD-10-CM

## 2018-10-09 DIAGNOSIS — Z79899 Other long term (current) drug therapy: Secondary | ICD-10-CM

## 2018-10-09 DIAGNOSIS — J449 Chronic obstructive pulmonary disease, unspecified: Secondary | ICD-10-CM

## 2018-10-09 DIAGNOSIS — Q2733 Arteriovenous malformation of digestive system vessel: Secondary | ICD-10-CM | POA: Diagnosis not present

## 2018-10-09 DIAGNOSIS — Z7951 Long term (current) use of inhaled steroids: Secondary | ICD-10-CM

## 2018-10-09 DIAGNOSIS — D500 Iron deficiency anemia secondary to blood loss (chronic): Secondary | ICD-10-CM

## 2018-10-09 DIAGNOSIS — N183 Chronic kidney disease, stage 3 (moderate): Secondary | ICD-10-CM

## 2018-10-09 DIAGNOSIS — I10 Essential (primary) hypertension: Secondary | ICD-10-CM

## 2018-10-09 LAB — CBC WITH DIFFERENTIAL (CANCER CENTER ONLY)
Abs Immature Granulocytes: 0.18 10*3/uL — ABNORMAL HIGH (ref 0.00–0.07)
Basophils Absolute: 0.1 10*3/uL (ref 0.0–0.1)
Basophils Relative: 0 %
Eosinophils Absolute: 0.2 10*3/uL (ref 0.0–0.5)
Eosinophils Relative: 2 %
HCT: 33.2 % — ABNORMAL LOW (ref 36.0–46.0)
Hemoglobin: 9.4 g/dL — ABNORMAL LOW (ref 12.0–15.0)
Immature Granulocytes: 2 %
Lymphocytes Relative: 14 %
Lymphs Abs: 1.6 10*3/uL (ref 0.7–4.0)
MCH: 24.5 pg — ABNORMAL LOW (ref 26.0–34.0)
MCHC: 28.3 g/dL — ABNORMAL LOW (ref 30.0–36.0)
MCV: 86.5 fL (ref 80.0–100.0)
Monocytes Absolute: 0.9 10*3/uL (ref 0.1–1.0)
Monocytes Relative: 8 %
Neutro Abs: 8.6 10*3/uL — ABNORMAL HIGH (ref 1.7–7.7)
Neutrophils Relative %: 74 %
Platelet Count: 438 10*3/uL — ABNORMAL HIGH (ref 150–400)
RBC: 3.84 MIL/uL — ABNORMAL LOW (ref 3.87–5.11)
RDW: 17.6 % — ABNORMAL HIGH (ref 11.5–15.5)
WBC Count: 11.5 10*3/uL — ABNORMAL HIGH (ref 4.0–10.5)
nRBC: 1.8 % — ABNORMAL HIGH (ref 0.0–0.2)

## 2018-10-09 LAB — IRON AND TIBC
Iron: 110 ug/dL (ref 41–142)
Saturation Ratios: 36 % (ref 21–57)
TIBC: 305 ug/dL (ref 236–444)
UIBC: 195 ug/dL (ref 120–384)

## 2018-10-09 LAB — FERRITIN: Ferritin: 406 ng/mL — ABNORMAL HIGH (ref 11–307)

## 2018-10-10 ENCOUNTER — Telehealth: Payer: Self-pay | Admitting: Hematology

## 2018-10-10 NOTE — Telephone Encounter (Signed)
Scheduled appt per 5/26 los.  Called patient and patient aware of her appt date and times.

## 2018-10-23 ENCOUNTER — Other Ambulatory Visit: Payer: Self-pay

## 2018-10-23 ENCOUNTER — Inpatient Hospital Stay: Payer: Medicare Other | Attending: Hematology

## 2018-10-23 DIAGNOSIS — Q2733 Arteriovenous malformation of digestive system vessel: Secondary | ICD-10-CM | POA: Insufficient documentation

## 2018-10-23 DIAGNOSIS — D500 Iron deficiency anemia secondary to blood loss (chronic): Secondary | ICD-10-CM | POA: Insufficient documentation

## 2018-10-23 LAB — IRON AND TIBC
Iron: 34 ug/dL — ABNORMAL LOW (ref 41–142)
Saturation Ratios: 11 % — ABNORMAL LOW (ref 21–57)
TIBC: 323 ug/dL (ref 236–444)
UIBC: 289 ug/dL (ref 120–384)

## 2018-10-23 LAB — CBC WITH DIFFERENTIAL (CANCER CENTER ONLY)
Abs Immature Granulocytes: 0.03 10*3/uL (ref 0.00–0.07)
Basophils Absolute: 0 10*3/uL (ref 0.0–0.1)
Basophils Relative: 0 %
Eosinophils Absolute: 0.2 10*3/uL (ref 0.0–0.5)
Eosinophils Relative: 2 %
HCT: 36.2 % (ref 36.0–46.0)
Hemoglobin: 10.2 g/dL — ABNORMAL LOW (ref 12.0–15.0)
Immature Granulocytes: 0 %
Lymphocytes Relative: 19 %
Lymphs Abs: 1.6 10*3/uL (ref 0.7–4.0)
MCH: 24 pg — ABNORMAL LOW (ref 26.0–34.0)
MCHC: 28.2 g/dL — ABNORMAL LOW (ref 30.0–36.0)
MCV: 85.2 fL (ref 80.0–100.0)
Monocytes Absolute: 0.5 10*3/uL (ref 0.1–1.0)
Monocytes Relative: 6 %
Neutro Abs: 6.3 10*3/uL (ref 1.7–7.7)
Neutrophils Relative %: 73 %
Platelet Count: 335 10*3/uL (ref 150–400)
RBC: 4.25 MIL/uL (ref 3.87–5.11)
RDW: 18.3 % — ABNORMAL HIGH (ref 11.5–15.5)
WBC Count: 8.8 10*3/uL (ref 4.0–10.5)
nRBC: 0 % (ref 0.0–0.2)

## 2018-10-23 LAB — FERRITIN: Ferritin: 106 ng/mL (ref 11–307)

## 2018-10-25 ENCOUNTER — Telehealth: Payer: Self-pay | Admitting: Hematology

## 2018-10-25 ENCOUNTER — Telehealth: Payer: Self-pay | Admitting: *Deleted

## 2018-10-25 NOTE — Telephone Encounter (Signed)
Called pt per 6/11 sch message - no answer. Left message with appt date and time

## 2018-10-25 NOTE — Telephone Encounter (Signed)
-----   Message from Truitt Merle, MD sent at 10/25/2018  7:53 AM EDT ----- Please let pt know her lab results, iron level slightly low, please schedule iv iron once for her, thanks   Truitt Merle  10/25/2018

## 2018-10-25 NOTE — Telephone Encounter (Signed)
Spoke with Bethany [name] and informed Bethany [name] of iron level slightly low.  Bethany [name] understood that she will be contacted with appt for IV Iron as per Dr. Ernestina Penna instructions.

## 2018-10-26 ENCOUNTER — Other Ambulatory Visit: Payer: Medicare Other

## 2018-10-26 ENCOUNTER — Ambulatory Visit: Payer: Medicare Other | Admitting: Hematology

## 2018-11-02 ENCOUNTER — Inpatient Hospital Stay: Payer: Medicare Other

## 2018-11-02 ENCOUNTER — Other Ambulatory Visit: Payer: Self-pay

## 2018-11-02 VITALS — BP 109/51 | HR 61 | Temp 98.7°F | Resp 18

## 2018-11-02 DIAGNOSIS — K5520 Angiodysplasia of colon without hemorrhage: Secondary | ICD-10-CM

## 2018-11-02 DIAGNOSIS — Q2733 Arteriovenous malformation of digestive system vessel: Secondary | ICD-10-CM | POA: Diagnosis not present

## 2018-11-02 DIAGNOSIS — D500 Iron deficiency anemia secondary to blood loss (chronic): Secondary | ICD-10-CM

## 2018-11-02 MED ORDER — SODIUM CHLORIDE 0.9 % IV SOLN
510.0000 mg | Freq: Once | INTRAVENOUS | Status: AC
  Administered 2018-11-02: 510 mg via INTRAVENOUS
  Filled 2018-11-02: qty 510

## 2018-11-02 MED ORDER — SODIUM CHLORIDE 0.9 % IV SOLN
Freq: Once | INTRAVENOUS | Status: AC
  Administered 2018-11-02: 16:00:00 via INTRAVENOUS
  Filled 2018-11-02: qty 250

## 2018-11-02 NOTE — Patient Instructions (Signed)
Coronavirus (COVID-19) Are you at risk?  Are you at risk for the Coronavirus (COVID-19)?  To be considered HIGH RISK for Coronavirus (COVID-19), you have to meet the following criteria:  . Traveled to [place], [place], [place], [place] or [place]; or in the [place] to [place], [place], [place], or [place]; and have fever, cough, and shortness of breath within the last 2 weeks of travel OR . Been in close contact with a person diagnosed with COVID-19 within the last 2 weeks and have fever, cough, and shortness of breath . IF YOU DO NOT MEET THESE CRITERIA, YOU ARE CONSIDERED LOW RISK FOR COVID-19.  What to do if you are HIGH RISK for COVID-19?  . If you are having a medical emergency, call 911. . Seek medical care right away. Before you go to a doctor's office, urgent care or emergency department, call ahead and tell them about your recent travel, contact with someone diagnosed with COVID-19, and your symptoms. You should receive instructions from your physician's office regarding next steps of care.  . When you arrive at healthcare provider, tell the healthcare staff immediately you have returned from visiting [place], [place], [place], [place] or [place]; or traveled in the [place] to [place], [place], [place], or [place]; in the last two weeks or you have been in close contact with a person diagnosed with COVID-19 in the last 2 weeks.   . Tell the health care staff about your symptoms: fever, cough and shortness of breath. . After you have been seen by a medical provider, you will be either: o Tested for (COVID-19) and discharged home on quarantine except to seek medical care if symptoms worsen, and asked to  - Stay home and avoid contact with others until you get your results (4-5 days)  - Avoid travel on public transportation if possible (such as bus, train, or airplane) or o Sent to the Emergency Department by EMS for evaluation, COVID-19 testing, and possible  admission depending on your condition and test results.  What to do if you are LOW RISK for COVID-19?  Reduce your risk of any infection by using the same precautions used for avoiding the common cold or flu:  . Wash your hands often with soap and warm water for at least 20 seconds.  If soap and water are not readily available, use an alcohol-based hand sanitizer with at least 60% alcohol.  . If coughing or sneezing, cover your mouth and nose by coughing or sneezing into the elbow areas of your shirt or coat, into a tissue or into your sleeve (not your hands). . Avoid shaking hands with others and consider head nods or verbal greetings only. . Avoid touching your eyes, nose, or mouth with unwashed hands.  . Avoid close contact with people who are sick. . Avoid places or events with large numbers of people in one location, like concerts or sporting events. . Carefully consider travel plans you have or are making. . If you are planning any travel outside or inside the [place], visit the CDC's Travelers' Health webpage for the latest health notices. . If you have some symptoms but not all symptoms, continue to monitor at home and seek medical attention if your symptoms worsen. . If you are having a medical emergency, call 911.  ADDITIONAL HEALTHCARE OPTIONS FOR PATIENTS  [facility] / e-Visit: [email or web address]         [facility]: [phone]  [facility]: [phone]                   [facility]: [phone]   Ferumoxytol injection What is this medicine? FERUMOXYTOL is an iron complex. Iron is used to make healthy red blood cells, which carry oxygen and nutrients throughout the body. This medicine is used to treat iron deficiency anemia. This medicine may be used for other purposes; ask your health care provider or pharmacist if you have questions. COMMON BRAND NAME(S): Feraheme What should I  tell my health care provider before I take this medicine? They need to know if you have any of these conditions: -anemia not caused by low iron levels -high levels of iron in the blood -magnetic resonance imaging (MRI) test scheduled -an unusual or allergic reaction to iron, other medicines, foods, dyes, or preservatives -pregnant or trying to get pregnant -breast-feeding How should I use this medicine? This medicine is for injection into a vein. It is given by a health care professional in a hospital or clinic setting. Talk to your pediatrician regarding the use of this medicine in children. Special care may be needed. Overdosage: If you think you have taken too much of this medicine contact a poison control center or emergency room at once. NOTE: This medicine is only for you. Do not share this medicine with others. What if I miss a dose? It is important not to miss your dose. Call your doctor or health care professional if you are unable to keep an appointment. What may interact with this medicine? This medicine may interact with the following medications: -other iron products This list may not describe all possible interactions. Give your health care provider a list of all the medicines, herbs, non-prescription drugs, or dietary supplements you use. Also tell them if you smoke, drink alcohol, or use illegal drugs. Some items may interact with your medicine. What should I watch for while using this medicine? Visit your doctor or healthcare professional regularly. Tell your doctor or healthcare professional if your symptoms do not start to get better or if they get worse. You may need blood work done while you are taking this medicine. You may need to follow a special diet. Talk to your doctor. Foods that contain iron include: whole grains/cereals, dried fruits, beans, or peas, leafy green vegetables, and organ meats (liver, kidney). What side effects may I notice from receiving this  medicine? Side effects that you should report to your doctor or health care professional as soon as possible: -allergic reactions like skin rash, itching or hives, swelling of the face, lips, or tongue -breathing problems -changes in blood pressure -feeling faint or lightheaded, falls -fever or chills -flushing, sweating, or hot feelings -swelling of the ankles or feet Side effects that usually do not require medical attention (report to your doctor or health care professional if they continue or are bothersome): -diarrhea -headache -nausea, vomiting -stomach pain This list may not describe all possible side effects. Call your doctor for medical advice about side effects. You may report side effects to FDA at 1-800-FDA-1088. Where should I keep my medicine? This drug is given in a hospital or clinic and will not be stored at home. NOTE: This sheet is a summary. It may not cover all possible information. If you have questions about this medicine, talk to your doctor, pharmacist, or health care provider.  2019 Elsevier/Gold Standard (2016-06-20 20:21:10)  

## 2018-11-02 NOTE — Progress Notes (Signed)
Patient declined to stay for 30-minute observation period post ferheme infusion.

## 2018-11-06 ENCOUNTER — Other Ambulatory Visit: Payer: Self-pay

## 2018-11-06 ENCOUNTER — Inpatient Hospital Stay: Payer: Medicare Other

## 2018-11-06 DIAGNOSIS — D500 Iron deficiency anemia secondary to blood loss (chronic): Secondary | ICD-10-CM

## 2018-11-06 DIAGNOSIS — Q2733 Arteriovenous malformation of digestive system vessel: Secondary | ICD-10-CM | POA: Diagnosis not present

## 2018-11-06 LAB — CBC WITH DIFFERENTIAL (CANCER CENTER ONLY)
Abs Immature Granulocytes: 0.07 10*3/uL (ref 0.00–0.07)
Basophils Absolute: 0 10*3/uL (ref 0.0–0.1)
Basophils Relative: 0 %
Eosinophils Absolute: 0.2 10*3/uL (ref 0.0–0.5)
Eosinophils Relative: 2 %
HCT: 37.4 % (ref 36.0–46.0)
Hemoglobin: 10.8 g/dL — ABNORMAL LOW (ref 12.0–15.0)
Immature Granulocytes: 1 %
Lymphocytes Relative: 15 %
Lymphs Abs: 1.5 10*3/uL (ref 0.7–4.0)
MCH: 24.1 pg — ABNORMAL LOW (ref 26.0–34.0)
MCHC: 28.9 g/dL — ABNORMAL LOW (ref 30.0–36.0)
MCV: 83.3 fL (ref 80.0–100.0)
Monocytes Absolute: 0.5 10*3/uL (ref 0.1–1.0)
Monocytes Relative: 6 %
Neutro Abs: 7.4 10*3/uL (ref 1.7–7.7)
Neutrophils Relative %: 76 %
Platelet Count: 290 10*3/uL (ref 150–400)
RBC: 4.49 MIL/uL (ref 3.87–5.11)
RDW: 17.7 % — ABNORMAL HIGH (ref 11.5–15.5)
WBC Count: 9.7 10*3/uL (ref 4.0–10.5)
nRBC: 0.3 % — ABNORMAL HIGH (ref 0.0–0.2)

## 2018-11-06 LAB — IRON AND TIBC
Iron: 109 ug/dL (ref 41–142)
Saturation Ratios: 32 % (ref 21–57)
TIBC: 338 ug/dL (ref 236–444)
UIBC: 228 ug/dL (ref 120–384)

## 2018-11-06 LAB — FERRITIN: Ferritin: 514 ng/mL — ABNORMAL HIGH (ref 11–307)

## 2018-11-09 ENCOUNTER — Telehealth: Payer: Self-pay | Admitting: *Deleted

## 2018-11-09 NOTE — Telephone Encounter (Signed)
TCT patient regarding lab results from 11/06/18.  Spoke with patient and informed her that her anemia is improving and that her iron levels are now normal. Pt received IV iron 1 week ago. Pt pleased with results. She is aware of upcoming appts in July 2020 No questions or concerns.

## 2018-11-20 ENCOUNTER — Other Ambulatory Visit: Payer: Self-pay

## 2018-11-20 ENCOUNTER — Inpatient Hospital Stay: Payer: Medicare Other | Attending: Hematology

## 2018-11-20 DIAGNOSIS — E1142 Type 2 diabetes mellitus with diabetic polyneuropathy: Secondary | ICD-10-CM | POA: Diagnosis not present

## 2018-11-20 DIAGNOSIS — N183 Chronic kidney disease, stage 3 (moderate): Secondary | ICD-10-CM | POA: Diagnosis not present

## 2018-11-20 DIAGNOSIS — E1122 Type 2 diabetes mellitus with diabetic chronic kidney disease: Secondary | ICD-10-CM | POA: Insufficient documentation

## 2018-11-20 DIAGNOSIS — R040 Epistaxis: Secondary | ICD-10-CM | POA: Insufficient documentation

## 2018-11-20 DIAGNOSIS — J449 Chronic obstructive pulmonary disease, unspecified: Secondary | ICD-10-CM | POA: Insufficient documentation

## 2018-11-20 DIAGNOSIS — I129 Hypertensive chronic kidney disease with stage 1 through stage 4 chronic kidney disease, or unspecified chronic kidney disease: Secondary | ICD-10-CM | POA: Diagnosis not present

## 2018-11-20 DIAGNOSIS — Q2733 Arteriovenous malformation of digestive system vessel: Secondary | ICD-10-CM | POA: Diagnosis present

## 2018-11-20 DIAGNOSIS — D500 Iron deficiency anemia secondary to blood loss (chronic): Secondary | ICD-10-CM | POA: Insufficient documentation

## 2018-11-20 LAB — CBC WITH DIFFERENTIAL (CANCER CENTER ONLY)
Abs Immature Granulocytes: 0.07 10*3/uL (ref 0.00–0.07)
Basophils Absolute: 0 10*3/uL (ref 0.0–0.1)
Basophils Relative: 0 %
Eosinophils Absolute: 0.2 10*3/uL (ref 0.0–0.5)
Eosinophils Relative: 2 %
HCT: 35.8 % — ABNORMAL LOW (ref 36.0–46.0)
Hemoglobin: 10.5 g/dL — ABNORMAL LOW (ref 12.0–15.0)
Immature Granulocytes: 1 %
Lymphocytes Relative: 12 %
Lymphs Abs: 1.1 10*3/uL (ref 0.7–4.0)
MCH: 24.1 pg — ABNORMAL LOW (ref 26.0–34.0)
MCHC: 29.3 g/dL — ABNORMAL LOW (ref 30.0–36.0)
MCV: 82.1 fL (ref 80.0–100.0)
Monocytes Absolute: 0.6 10*3/uL (ref 0.1–1.0)
Monocytes Relative: 7 %
Neutro Abs: 7.1 10*3/uL (ref 1.7–7.7)
Neutrophils Relative %: 78 %
Platelet Count: 287 10*3/uL (ref 150–400)
RBC: 4.36 MIL/uL (ref 3.87–5.11)
RDW: 17.8 % — ABNORMAL HIGH (ref 11.5–15.5)
WBC Count: 9.2 10*3/uL (ref 4.0–10.5)
nRBC: 0.2 % (ref 0.0–0.2)

## 2018-11-20 LAB — IRON AND TIBC
Iron: 72 ug/dL (ref 41–142)
Saturation Ratios: 21 % (ref 21–57)
TIBC: 336 ug/dL (ref 236–444)
UIBC: 264 ug/dL (ref 120–384)

## 2018-11-20 LAB — FERRITIN: Ferritin: 144 ng/mL (ref 11–307)

## 2018-11-21 ENCOUNTER — Telehealth: Payer: Self-pay

## 2018-11-21 NOTE — Telephone Encounter (Signed)
-----   Message from Truitt Merle, MD sent at 11/21/2018  7:08 AM EDT ----- Please let pt know her lab results, iron level good, anemia stable, no concerns, thanks   Truitt Merle  11/21/2018

## 2018-11-21 NOTE — Telephone Encounter (Signed)
Left voice message for this patient regarding lab results.  Per Dr. Burr Medico iron level is good and anemia is stable, no concerns.

## 2018-11-28 NOTE — Progress Notes (Addendum)
[facility]   Telephone:([phone]) [phone] Fax:(336) [phone]   Clinic Follow up Note   Patient Care Team: Wenda Low, MD as PCP - General (Internal Medicine) Truitt Merle, MD as Consulting Physician (Hematology) Wilford Corner, MD as Consulting Physician (Gastroenterology) Angelia Mould, MD as Consulting Physician (Vascular Surgery)  Date of Service:  12/05/2018  CHIEF COMPLAINT: F/u of Anemia  CURRENT THERAPY:  iv Feraheme as needed (if ferritin <100)  INTERVAL HISTORY:  Bethany [name] is here for a follow up anemia. She presents to the clinic alone. She notes she has been up and down, overall fair. She still has mid-right lower back pain. She saw and orthopedic and will have spine MRI next Friday. This has been since 2013 after surgery and complications. She notes her stool is not dark but notes her energy level is still low.  She notes she spoke with Dr. Kathline Magic NP last month.    REVIEW OF SYSTEMS:   Constitutional: Denies fevers, chills or abnormal weight loss (+) Low energy  Eyes: Denies blurriness of vision Ears, nose, mouth, throat, and face: Denies mucositis or sore throat Respiratory: Denies cough, dyspnea or wheezes Cardiovascular: Denies palpitation, chest discomfort or lower extremity swelling Gastrointestinal:  Denies nausea, heartburn or change in bowel habits Skin: Denies abnormal skin rashes MSK: (+) Mid-right lower back pain  Lymphatics: Denies new lymphadenopathy or easy bruising Neurological:Denies numbness, tingling or new weaknesses Behavioral/Psych: Mood is stable, no new changes  All other systems were reviewed with the patient and are negative.  MEDICAL HISTORY:  Past Medical History:  Diagnosis Date  . Acute renal failure ([place])   . Arthritis   . Diabetes mellitus without complication ([place])    type II   . Hypercalcemia   . Hypertension   . Renal disorder   . Single kidney   . Thrombosis     SURGICAL HISTORY: Past  Surgical History:  Procedure Laterality Date  . ABDOMINAL HYSTERECTOMY    . blood clots removed from descending aorta     . CHOLECYSTECTOMY    . COLONOSCOPY    . COLONOSCOPY WITH PROPOFOL N/A 04/17/2017   Procedure: COLONOSCOPY WITH PROPOFOL;  Surgeon: Wilford Corner, MD;  Location: [facility];  Service: Endoscopy;  Laterality: N/A;  . ESOPHAGOGASTRODUODENOSCOPY (EGD) WITH PROPOFOL N/A 04/17/2017   Procedure: ESOPHAGOGASTRODUODENOSCOPY (EGD) WITH PROPOFOL;  Surgeon: Wilford Corner, MD;  Location: [facility];  Service: Endoscopy;  Laterality: N/A;  . NEPHRECTOMY RECIPIENT      I have reviewed the social history and family history with the patient and they are unchanged from previous note.  ALLERGIES:  is allergic to contrast media [iodinated diagnostic agents].  MEDICATIONS:  Current Outpatient Medications  Medication Sig Dispense Refill  . amLODipine (NORVASC) 5 MG tablet Take 1 tablet (5 mg total) by mouth daily. 30 tablet 0  . cholecalciferol (VITAMIN D) 1000 units tablet Take 1 tablet (1,000 Units total) by mouth once a week.    . ciclopirox (PENLAC) 8 % solution Apply topically at bedtime. Apply over nail and surrounding skin. Apply daily over previous coat. After seven (7) days, may remove with alcohol and continue cycle. 6.6 mL 11  . Dulaglutide (TRULICITY [place]) Inject into the skin.    [place] gabapentin (NEURONTIN) 600 MG tablet Take 600 mg by mouth 4 (four) times daily.     . hydrALAZINE (APRESOLINE) 25 MG tablet Take 25 mg by mouth 3 (three) times daily.    . Insulin Glargine (TOUJEO SOLOSTAR) 300 UNIT/ML  SOPN Inject 40 Units into the skin at bedtime.     . insulin lispro (HUMALOG KWIKPEN) 100 UNIT/ML KiwkPen Inject 22-24 Units into the skin daily as needed. Inject 24 units with breakfast, 22 units with lunch, and 24 units with dinner    . Ipratropium-Albuterol (COMBIVENT) 20-100 MCG/ACT AERS respimat Inhale 1 puff into the lungs every 6 (six) hours as needed for wheezing or  shortness of breath. 1 Inhaler 0  . levothyroxine (SYNTHROID, LEVOTHROID) 25 MCG tablet Take 75 mcg by mouth daily before breakfast.     . metoprolol (LOPRESSOR) 50 MG tablet Take 50 mg by mouth 2 (two) times daily.     . nicotine (NICODERM CQ - DOSED IN MG/24 HOURS) 21 mg/24hr patch Place 1 patch (21 mg total) onto the skin daily. 28 patch 0  . oxyCODONE (XTAMPZA ER PO) Take by mouth. Unsure of dose    . oxyCODONE-acetaminophen (PERCOCET) 10-325 MG tablet Take 1 tablet by mouth every 6 (six) hours as needed for pain.    . pantoprazole (PROTONIX) 40 MG tablet Take 40 mg by mouth daily.    . polyethylene glycol (MIRALAX / GLYCOLAX) packet Take 17 g by mouth daily. 30 each 3  . PRALUENT 75 MG/ML SOPN Inject 75 mg as directed every 14 (fourteen) days.  4   No current facility-administered medications for this visit.     PHYSICAL EXAMINATION: ECOG PERFORMANCE STATUS: 1 - Symptomatic but completely ambulatory  Vitals:   12/05/18 1019  BP: (!) 144/53  Pulse: 70  Resp: 17  Temp: 98.3 F (36.8 C)  SpO2: 93%   Filed Weights   12/05/18 1019  Weight: 190 lb 12.8 oz (86.5 kg)    GENERAL:alert, no distress and comfortable SKIN: skin color, texture, turgor are normal, no rashes or significant lesions EYES: normal, Conjunctiva are pink and non-injected, sclera clear  NECK: supple, thyroid normal size, non-tender, without nodularity LYMPH:  no palpable lymphadenopathy in the cervical, axillary  LUNGS: clear to auscultation and percussion with normal breathing effort HEART: regular rate & rhythm and no murmurs and no lower extremity edema ABDOMEN:abdomen soft, non-tender and normal bowel sounds Musculoskeletal:no cyanosis of digits and no clubbing  NEURO: alert & oriented x 3 with fluent speech, no focal motor/sensory deficits  LABORATORY DATA:  I have reviewed the data as listed CBC Latest Ref Rng & Units 12/05/2018 11/20/2018 11/06/2018  WBC 4.0 - 10.5 K/uL 10.1 9.2 9.7  Hemoglobin 12.0 -  15.0 g/dL 10.2(L) 10.5(L) 10.8(L)  Hematocrit 36.0 - 46.0 % 34.2(L) 35.8(L) 37.4  Platelets 150 - 400 K/uL 280 287 290     CMP Latest Ref Rng & Units 08/03/2018 11/07/2017 10/13/2017  Glucose 70 - 99 mg/dL 161(H) 130(H) 143(H)  BUN 8 - 23 mg/dL 16 21 23   Creatinine 0.44 - 1.00 mg/dL 1.27(H) 1.18(H) 1.35(H)  Sodium 135 - 145 mmol/L 137 139 137  Potassium 3.5 - 5.1 mmol/L 4.0 4.9 4.6  Chloride 98 - 111 mmol/L 103 104 103  CO2 22 - 32 mmol/L 23 24 26   Calcium 8.9 - 10.3 mg/dL 9.9 10.6(H) 10.7(H)  Total Protein 6.5 - 8.1 g/dL 7.0 7.1 7.5  Total Bilirubin 0.3 - 1.2 mg/dL 0.2(L) 0.2(L) 0.3  Alkaline Phos 38 - 126 U/L 166(H) 121 158(H)  AST 15 - 41 U/L 23 27 54(H)  ALT 0 - 44 U/L 36 28 69(H)      RADIOGRAPHIC STUDIES: I have personally reviewed the radiological images as listed and agreed with the findings  in the report. No results found.   ASSESSMENT & PLAN:  CHLOE MIYOSHI is a 68 y.o. female with   1. 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 She has required blood transfusions in the past in 2013 and in 2018 and frequent hospital admission for anemia. -She has intermittent epistaxis, but no skin or mucosa telangiectasia, no significant family history of bleeding disorder, she does not meet the diagnosis criteria for hereditary hemorrhagic telangiectasia (HHT). -She is not on oral iron, due to the lack of benefit. -Currently being treated withIV Iron to prevent significant anemiawith goal of Ferritin 100-200 range.  -she did see GI Dr. Michail Sermon but repeated endoscopy was not recommended -Last IV Feraheme on 11/02/18. Although her labs since have been adequate she has continued to be fatigued. She denies blood in stool.  -Labs reviewed, CBC WNL except Hg 10.2, ANC 8.1. Iron 29, iron sat 9, Ferritin 66, iron saturation 9% . Will give IV  Feraheme tomorrow -continue close follow up labs and monthly IV iron. She may return to GI to possibly cauterize her bleeding blood vessels.  -F/u in 3 months   2. Chronic GI Bleeding, intermittent epistaxis -Has intestinal AVM history  -Had colonoscopy/EGD by Dr. Michail Sermon on 04/17/17 which showed gastritis, diverticulosis and internal hemorrhoids, all benign with no obvious signs of bleeding at that time. -She does not meet the diagnosis criteria for hereditary hemorrhagic telangiectasia (HHT) -She has been having intermittent black stool, frequent BMs. I recommend she monitor her stool and energy level. If she develops significant bleeding she should go to ED. If moderate she should contact Dr Michail Sermon for scope.  -No blood in stool, no dark stool currently.   3. H/o of arterial thrombosis -S/p unilateral nephrectomy due to damage from thrombosis -Managed by vascular physician, Dr. Doren Custard  -patient does not currently receive therapeutic anticoagulation due to history of severe GI bleeding. -Previous work-up for lupus anticoagulantand antiphospholipid syndromewerenegative.  4. Smoking Cessation  -Currently smokes 1.5packs over 2 days currently. She has been smoking for 40 years. -Iagainencouraged her to continue to try to quit completely, she understand and is willing to try.   5. COPD, HTN, CKD stage III -Only has 1 kidney. Will check her kidney function every 6 months  -f/u with PCP -BP at 144/53 today (12/05/18), I encourage her to monitor at home  6. DM with Peripheral Neuropathy  -She notes her neuropathy has gotten worse and has been experienced upper left leg pain from this.  -She is currently on 600mg  Gabapentin 4 times a day.  -After recent visit with Dr. Deforest Hoyles she has Hb A1c of 4.8. Her insulin has been reduced. I discussed she should watch for hypoglycemia.  PLAN: Today's iron 29, ferritin 66will proceed with IV feraheme tomorrow Lab and IV Feraheme  every 4 weeks as needed. Iv iron if ferritin<100 or low iron saturation on last lab F/u in 3 months    No problem-specific Assessment & Plan notes found for this encounter.   No orders of the defined types were placed in this encounter.  All questions were answered. The patient knows to call the clinic with any problems, questions or concerns. No barriers to learning was detected. I spent 10 minutes counseling the patient face to face. The total time spent in the appointment was 15 minutes and more than 50% was  on counseling and review of test results     Truitt Merle, MD 12/05/2018   I, Joslyn Devon, am acting as scribe for Truitt Merle, MD.   I have reviewed the above documentation for accuracy and completeness, and I agree with the above.

## 2018-12-03 ENCOUNTER — Ambulatory Visit: Payer: Medicare Other | Admitting: Hematology

## 2018-12-03 ENCOUNTER — Other Ambulatory Visit: Payer: Medicare Other

## 2018-12-05 ENCOUNTER — Encounter: Payer: Self-pay | Admitting: Hematology

## 2018-12-05 ENCOUNTER — Inpatient Hospital Stay (HOSPITAL_BASED_OUTPATIENT_CLINIC_OR_DEPARTMENT_OTHER): Payer: Medicare Other | Admitting: Hematology

## 2018-12-05 ENCOUNTER — Telehealth: Payer: Self-pay | Admitting: Hematology

## 2018-12-05 ENCOUNTER — Inpatient Hospital Stay: Payer: Medicare Other

## 2018-12-05 ENCOUNTER — Other Ambulatory Visit: Payer: Self-pay

## 2018-12-05 VITALS — BP 144/53 | HR 70 | Temp 98.3°F | Resp 17 | Ht 65.0 in | Wt 190.8 lb

## 2018-12-05 DIAGNOSIS — J449 Chronic obstructive pulmonary disease, unspecified: Secondary | ICD-10-CM

## 2018-12-05 DIAGNOSIS — Q2733 Arteriovenous malformation of digestive system vessel: Secondary | ICD-10-CM | POA: Diagnosis not present

## 2018-12-05 DIAGNOSIS — D500 Iron deficiency anemia secondary to blood loss (chronic): Secondary | ICD-10-CM

## 2018-12-05 DIAGNOSIS — R040 Epistaxis: Secondary | ICD-10-CM

## 2018-12-05 DIAGNOSIS — I129 Hypertensive chronic kidney disease with stage 1 through stage 4 chronic kidney disease, or unspecified chronic kidney disease: Secondary | ICD-10-CM

## 2018-12-05 DIAGNOSIS — N183 Chronic kidney disease, stage 3 (moderate): Secondary | ICD-10-CM

## 2018-12-05 DIAGNOSIS — E1142 Type 2 diabetes mellitus with diabetic polyneuropathy: Secondary | ICD-10-CM

## 2018-12-05 DIAGNOSIS — E1122 Type 2 diabetes mellitus with diabetic chronic kidney disease: Secondary | ICD-10-CM

## 2018-12-05 LAB — CBC WITH DIFFERENTIAL (CANCER CENTER ONLY)
Abs Immature Granulocytes: 0.05 10*3/uL (ref 0.00–0.07)
Basophils Absolute: 0 10*3/uL (ref 0.0–0.1)
Basophils Relative: 0 %
Eosinophils Absolute: 0.2 10*3/uL (ref 0.0–0.5)
Eosinophils Relative: 2 %
HCT: 34.2 % — ABNORMAL LOW (ref 36.0–46.0)
Hemoglobin: 10.2 g/dL — ABNORMAL LOW (ref 12.0–15.0)
Immature Granulocytes: 1 %
Lymphocytes Relative: 10 %
Lymphs Abs: 1 10*3/uL (ref 0.7–4.0)
MCH: 24.2 pg — ABNORMAL LOW (ref 26.0–34.0)
MCHC: 29.8 g/dL — ABNORMAL LOW (ref 30.0–36.0)
MCV: 81.2 fL (ref 80.0–100.0)
Monocytes Absolute: 0.6 10*3/uL (ref 0.1–1.0)
Monocytes Relative: 6 %
Neutro Abs: 8.1 10*3/uL — ABNORMAL HIGH (ref 1.7–7.7)
Neutrophils Relative %: 81 %
Platelet Count: 280 10*3/uL (ref 150–400)
RBC: 4.21 MIL/uL (ref 3.87–5.11)
RDW: 18.1 % — ABNORMAL HIGH (ref 11.5–15.5)
WBC Count: 10.1 10*3/uL (ref 4.0–10.5)
nRBC: 0.2 % (ref 0.0–0.2)

## 2018-12-05 LAB — IRON AND TIBC
Iron: 29 ug/dL — ABNORMAL LOW (ref 41–142)
Saturation Ratios: 9 % — ABNORMAL LOW (ref 21–57)
TIBC: 330 ug/dL (ref 236–444)
UIBC: 301 ug/dL (ref 120–384)

## 2018-12-05 LAB — FERRITIN: Ferritin: 66 ng/mL (ref 11–307)

## 2018-12-05 NOTE — Telephone Encounter (Signed)
Scheduled appt per 7/22 los.  Spoke with patient and she is aware of her appt date and times.

## 2018-12-06 ENCOUNTER — Other Ambulatory Visit: Payer: Self-pay

## 2018-12-06 ENCOUNTER — Inpatient Hospital Stay: Payer: Medicare Other

## 2018-12-06 VITALS — BP 165/62 | HR 79 | Temp 98.7°F | Resp 18

## 2018-12-06 DIAGNOSIS — K5520 Angiodysplasia of colon without hemorrhage: Secondary | ICD-10-CM

## 2018-12-06 DIAGNOSIS — D500 Iron deficiency anemia secondary to blood loss (chronic): Secondary | ICD-10-CM

## 2018-12-06 DIAGNOSIS — Q2733 Arteriovenous malformation of digestive system vessel: Secondary | ICD-10-CM | POA: Diagnosis not present

## 2018-12-06 MED ORDER — SODIUM CHLORIDE 0.9 % IV SOLN
INTRAVENOUS | Status: DC
  Administered 2018-12-06: 16:00:00 via INTRAVENOUS
  Filled 2018-12-06: qty 250

## 2018-12-06 MED ORDER — SODIUM CHLORIDE 0.9 % IV SOLN
510.0000 mg | Freq: Once | INTRAVENOUS | Status: AC
  Administered 2018-12-06: 510 mg via INTRAVENOUS
  Filled 2018-12-06: qty 17

## 2018-12-06 NOTE — Patient Instructions (Signed)
Coronavirus (COVID-19) Are you at risk?  Are you at risk for the Coronavirus (COVID-19)?  To be considered HIGH RISK for Coronavirus (COVID-19), you have to meet the following criteria:  . Traveled to [place], [place], [place], [place] or [place]; or in the [place] to [place], [place], [place], or [place]; and have fever, cough, and shortness of breath within the last 2 weeks of travel OR . Been in close contact with a person diagnosed with COVID-19 within the last 2 weeks and have fever, cough, and shortness of breath . IF YOU DO NOT MEET THESE CRITERIA, YOU ARE CONSIDERED LOW RISK FOR COVID-19.  What to do if you are HIGH RISK for COVID-19?  . If you are having a medical emergency, call 911. . Seek medical care right away. Before you go to a doctor's office, urgent care or emergency department, call ahead and tell them about your recent travel, contact with someone diagnosed with COVID-19, and your symptoms. You should receive instructions from your physician's office regarding next steps of care.  . When you arrive at healthcare provider, tell the healthcare staff immediately you have returned from visiting [place], [place], [place], [place] or [place]; or traveled in the [place] to [place], [place], [place], or [place]; in the last two weeks or you have been in close contact with a person diagnosed with COVID-19 in the last 2 weeks.   . Tell the health care staff about your symptoms: fever, cough and shortness of breath. . After you have been seen by a medical provider, you will be either: o Tested for (COVID-19) and discharged home on quarantine except to seek medical care if symptoms worsen, and asked to  - Stay home and avoid contact with others until you get your results (4-5 days)  - Avoid travel on public transportation if possible (such as bus, train, or airplane) or o Sent to the Emergency Department by EMS for evaluation, COVID-19 testing, and possible  admission depending on your condition and test results.  What to do if you are LOW RISK for COVID-19?  Reduce your risk of any infection by using the same precautions used for avoiding the common cold or flu:  . Wash your hands often with soap and warm water for at least 20 seconds.  If soap and water are not readily available, use an alcohol-based hand sanitizer with at least 60% alcohol.  . If coughing or sneezing, cover your mouth and nose by coughing or sneezing into the elbow areas of your shirt or coat, into a tissue or into your sleeve (not your hands). . Avoid shaking hands with others and consider head nods or verbal greetings only. . Avoid touching your eyes, nose, or mouth with unwashed hands.  . Avoid close contact with people who are sick. . Avoid places or events with large numbers of people in one location, like concerts or sporting events. . Carefully consider travel plans you have or are making. . If you are planning any travel outside or inside the [place], visit the CDC's Travelers' Health webpage for the latest health notices. . If you have some symptoms but not all symptoms, continue to monitor at home and seek medical attention if your symptoms worsen. . If you are having a medical emergency, call 911.  ADDITIONAL HEALTHCARE OPTIONS FOR PATIENTS  [facility] / e-Visit: [email or web address]         [facility]: [phone]  [facility]: [phone]                   [facility]: [phone]   Ferumoxytol injection What is this medicine? FERUMOXYTOL is an iron complex. Iron is used to make healthy red blood cells, which carry oxygen and nutrients throughout the body. This medicine is used to treat iron deficiency anemia. This medicine may be used for other purposes; ask your health care provider or pharmacist if you have questions. COMMON BRAND NAME(S): Feraheme What should I  tell my health care provider before I take this medicine? They need to know if you have any of these conditions: -anemia not caused by low iron levels -high levels of iron in the blood -magnetic resonance imaging (MRI) test scheduled -an unusual or allergic reaction to iron, other medicines, foods, dyes, or preservatives -pregnant or trying to get pregnant -breast-feeding How should I use this medicine? This medicine is for injection into a vein. It is given by a health care professional in a hospital or clinic setting. Talk to your pediatrician regarding the use of this medicine in children. Special care may be needed. Overdosage: If you think you have taken too much of this medicine contact a poison control center or emergency room at once. NOTE: This medicine is only for you. Do not share this medicine with others. What if I miss a dose? It is important not to miss your dose. Call your doctor or health care professional if you are unable to keep an appointment. What may interact with this medicine? This medicine may interact with the following medications: -other iron products This list may not describe all possible interactions. Give your health care provider a list of all the medicines, herbs, non-prescription drugs, or dietary supplements you use. Also tell them if you smoke, drink alcohol, or use illegal drugs. Some items may interact with your medicine. What should I watch for while using this medicine? Visit your doctor or healthcare professional regularly. Tell your doctor or healthcare professional if your symptoms do not start to get better or if they get worse. You may need blood work done while you are taking this medicine. You may need to follow a special diet. Talk to your doctor. Foods that contain iron include: whole grains/cereals, dried fruits, beans, or peas, leafy green vegetables, and organ meats (liver, kidney). What side effects may I notice from receiving this  medicine? Side effects that you should report to your doctor or health care professional as soon as possible: -allergic reactions like skin rash, itching or hives, swelling of the face, lips, or tongue -breathing problems -changes in blood pressure -feeling faint or lightheaded, falls -fever or chills -flushing, sweating, or hot feelings -swelling of the ankles or feet Side effects that usually do not require medical attention (report to your doctor or health care professional if they continue or are bothersome): -diarrhea -headache -nausea, vomiting -stomach pain This list may not describe all possible side effects. Call your doctor for medical advice about side effects. You may report side effects to FDA at 1-800-FDA-1088. Where should I keep my medicine? This drug is given in a hospital or clinic and will not be stored at home. NOTE: This sheet is a summary. It may not cover all possible information. If you have questions about this medicine, talk to your doctor, pharmacist, or health care provider.  2019 Elsevier/Gold Standard (2016-06-20 20:21:10)  

## 2018-12-28 ENCOUNTER — Telehealth: Payer: Self-pay | Admitting: Hematology

## 2018-12-28 ENCOUNTER — Other Ambulatory Visit: Payer: Self-pay | Admitting: Hematology

## 2018-12-28 NOTE — Telephone Encounter (Signed)
Added f/u to 9/16 lab/infusion. Start time remains the same.

## 2019-01-02 ENCOUNTER — Inpatient Hospital Stay: Payer: Medicare Other | Attending: Hematology

## 2019-01-02 ENCOUNTER — Ambulatory Visit: Payer: Medicare Other

## 2019-01-02 ENCOUNTER — Inpatient Hospital Stay (HOSPITAL_BASED_OUTPATIENT_CLINIC_OR_DEPARTMENT_OTHER): Payer: Medicare Other | Admitting: Medical

## 2019-01-02 ENCOUNTER — Other Ambulatory Visit: Payer: Self-pay

## 2019-01-02 ENCOUNTER — Inpatient Hospital Stay: Payer: Medicare Other

## 2019-01-02 ENCOUNTER — Other Ambulatory Visit: Payer: Medicare Other

## 2019-01-02 VITALS — BP 152/75 | HR 62 | Temp 98.1°F | Resp 18

## 2019-01-02 DIAGNOSIS — D500 Iron deficiency anemia secondary to blood loss (chronic): Secondary | ICD-10-CM | POA: Diagnosis present

## 2019-01-02 DIAGNOSIS — Q2733 Arteriovenous malformation of digestive system vessel: Secondary | ICD-10-CM | POA: Insufficient documentation

## 2019-01-02 DIAGNOSIS — T80818A Extravasation of other vesicant agent, initial encounter: Secondary | ICD-10-CM

## 2019-01-02 DIAGNOSIS — K5520 Angiodysplasia of colon without hemorrhage: Secondary | ICD-10-CM

## 2019-01-02 LAB — FERRITIN: Ferritin: 84 ng/mL (ref 11–307)

## 2019-01-02 LAB — CBC WITH DIFFERENTIAL (CANCER CENTER ONLY)
Abs Immature Granulocytes: 0.06 10*3/uL (ref 0.00–0.07)
Basophils Absolute: 0.1 10*3/uL (ref 0.0–0.1)
Basophils Relative: 1 %
Eosinophils Absolute: 0.2 10*3/uL (ref 0.0–0.5)
Eosinophils Relative: 2 %
HCT: 35.1 % — ABNORMAL LOW (ref 36.0–46.0)
Hemoglobin: 10.4 g/dL — ABNORMAL LOW (ref 12.0–15.0)
Immature Granulocytes: 1 %
Lymphocytes Relative: 15 %
Lymphs Abs: 1.4 10*3/uL (ref 0.7–4.0)
MCH: 24.1 pg — ABNORMAL LOW (ref 26.0–34.0)
MCHC: 29.6 g/dL — ABNORMAL LOW (ref 30.0–36.0)
MCV: 81.4 fL (ref 80.0–100.0)
Monocytes Absolute: 0.7 10*3/uL (ref 0.1–1.0)
Monocytes Relative: 7 %
Neutro Abs: 7.1 10*3/uL (ref 1.7–7.7)
Neutrophils Relative %: 74 %
Platelet Count: 348 10*3/uL (ref 150–400)
RBC: 4.31 MIL/uL (ref 3.87–5.11)
RDW: 16.8 % — ABNORMAL HIGH (ref 11.5–15.5)
WBC Count: 9.5 10*3/uL (ref 4.0–10.5)
nRBC: 0.4 % — ABNORMAL HIGH (ref 0.0–0.2)

## 2019-01-02 LAB — IRON AND TIBC
Iron: 39 ug/dL — ABNORMAL LOW (ref 41–142)
Saturation Ratios: 13 % — ABNORMAL LOW (ref 21–57)
TIBC: 301 ug/dL (ref 236–444)
UIBC: 262 ug/dL (ref 120–384)

## 2019-01-02 MED ORDER — SODIUM CHLORIDE 0.9 % IV SOLN
200.0000 mg | Freq: Once | INTRAVENOUS | Status: AC
  Administered 2019-01-02: 200 mg via INTRAVENOUS
  Filled 2019-01-02: qty 10

## 2019-01-02 MED ORDER — SODIUM CHLORIDE 0.9 % IV SOLN
Freq: Once | INTRAVENOUS | Status: AC
  Administered 2019-01-02: 09:00:00 via INTRAVENOUS
  Filled 2019-01-02: qty 250

## 2019-01-02 NOTE — Progress Notes (Signed)
Patient's IV infiltrated at the end of infusion. Was noticed at 27, with minor swelling around the sight. Infusion was stopped and Sandi Mealy PA was called to chairside. Instructions were given to place ice on the site 4x over the next 24 hours, for 15 minutes. Patient verbalized understanding.

## 2019-01-02 NOTE — Patient Instructions (Signed)
Coronavirus (COVID-19) Are you at risk?  Are you at risk for the Coronavirus (COVID-19)?  To be considered HIGH RISK for Coronavirus (COVID-19), you have to meet the following criteria:  . Traveled to [place], [place], [place], [place] or [place]; or in the [place] to [place], [place], [place], or [place]; and have fever, cough, and shortness of breath within the last 2 weeks of travel OR . Been in close contact with a person diagnosed with COVID-19 within the last 2 weeks and have fever, cough, and shortness of breath . IF YOU DO NOT MEET THESE CRITERIA, YOU ARE CONSIDERED LOW RISK FOR COVID-19.  What to do if you are HIGH RISK for COVID-19?  . If you are having a medical emergency, call 911. . Seek medical care right away. Before you go to a doctor's office, urgent care or emergency department, call ahead and tell them about your recent travel, contact with someone diagnosed with COVID-19, and your symptoms. You should receive instructions from your physician's office regarding next steps of care.  . When you arrive at healthcare provider, tell the healthcare staff immediately you have returned from visiting [place], [place], [place], [place] or [place]; or traveled in the [place] to [place], [place], [place], or [place]; in the last two weeks or you have been in close contact with a person diagnosed with COVID-19 in the last 2 weeks.   . Tell the health care staff about your symptoms: fever, cough and shortness of breath. . After you have been seen by a medical provider, you will be either: o Tested for (COVID-19) and discharged home on quarantine except to seek medical care if symptoms worsen, and asked to  - Stay home and avoid contact with others until you get your results (4-5 days)  - Avoid travel on public transportation if possible (such as bus, train, or airplane) or o Sent to the Emergency Department by EMS for evaluation, COVID-19 testing, and possible  admission depending on your condition and test results.  What to do if you are LOW RISK for COVID-19?  Reduce your risk of any infection by using the same precautions used for avoiding the common cold or flu:  . Wash your hands often with soap and warm water for at least 20 seconds.  If soap and water are not readily available, use an alcohol-based hand sanitizer with at least 60% alcohol.  . If coughing or sneezing, cover your mouth and nose by coughing or sneezing into the elbow areas of your shirt or coat, into a tissue or into your sleeve (not your hands). . Avoid shaking hands with others and consider head nods or verbal greetings only. . Avoid touching your eyes, nose, or mouth with unwashed hands.  . Avoid close contact with people who are sick. . Avoid places or events with large numbers of people in one location, like concerts or sporting events. . Carefully consider travel plans you have or are making. . If you are planning any travel outside or inside the [place], visit the CDC's Travelers' Health webpage for the latest health notices. . If you have some symptoms but not all symptoms, continue to monitor at home and seek medical attention if your symptoms worsen. . If you are having a medical emergency, call 911.  ADDITIONAL HEALTHCARE OPTIONS FOR PATIENTS  [facility] / e-Visit: [email or web address]         [facility]: [phone]  [facility]: [phone]                   [facility]: [phone]   Ferumoxytol injection What is this medicine? FERUMOXYTOL is an iron complex. Iron is used to make healthy red blood cells, which carry oxygen and nutrients throughout the body. This medicine is used to treat iron deficiency anemia. This medicine may be used for other purposes; ask your health care provider or pharmacist if you have questions. COMMON BRAND NAME(S): Feraheme What should I  tell my health care provider before I take this medicine? They need to know if you have any of these conditions: -anemia not caused by low iron levels -high levels of iron in the blood -magnetic resonance imaging (MRI) test scheduled -an unusual or allergic reaction to iron, other medicines, foods, dyes, or preservatives -pregnant or trying to get pregnant -breast-feeding How should I use this medicine? This medicine is for injection into a vein. It is given by a health care professional in a hospital or clinic setting. Talk to your pediatrician regarding the use of this medicine in children. Special care may be needed. Overdosage: If you think you have taken too much of this medicine contact a poison control center or emergency room at once. NOTE: This medicine is only for you. Do not share this medicine with others. What if I miss a dose? It is important not to miss your dose. Call your doctor or health care professional if you are unable to keep an appointment. What may interact with this medicine? This medicine may interact with the following medications: -other iron products This list may not describe all possible interactions. Give your health care provider a list of all the medicines, herbs, non-prescription drugs, or dietary supplements you use. Also tell them if you smoke, drink alcohol, or use illegal drugs. Some items may interact with your medicine. What should I watch for while using this medicine? Visit your doctor or healthcare professional regularly. Tell your doctor or healthcare professional if your symptoms do not start to get better or if they get worse. You may need blood work done while you are taking this medicine. You may need to follow a special diet. Talk to your doctor. Foods that contain iron include: whole grains/cereals, dried fruits, beans, or peas, leafy green vegetables, and organ meats (liver, kidney). What side effects may I notice from receiving this  medicine? Side effects that you should report to your doctor or health care professional as soon as possible: -allergic reactions like skin rash, itching or hives, swelling of the face, lips, or tongue -breathing problems -changes in blood pressure -feeling faint or lightheaded, falls -fever or chills -flushing, sweating, or hot feelings -swelling of the ankles or feet Side effects that usually do not require medical attention (report to your doctor or health care professional if they continue or are bothersome): -diarrhea -headache -nausea, vomiting -stomach pain This list may not describe all possible side effects. Call your doctor for medical advice about side effects. You may report side effects to FDA at 1-800-FDA-1088. Where should I keep my medicine? This drug is given in a hospital or clinic and will not be stored at home. NOTE: This sheet is a summary. It may not cover all possible information. If you have questions about this medicine, talk to your doctor, pharmacist, or health care provider.  2019 Elsevier/Gold Standard (2016-06-20 20:21:10)  

## 2019-01-02 NOTE — Progress Notes (Signed)
   DATE: 01/02/2019      IV EXTRAVASATION (NEUTRAL)  MD:  Dr. Truitt Merle  AGENT RECEIVED AT TIME OF EXTRAVASATION:  Venofer  IV SITE LOCATION: Right forearm  INTERVENTION:  1) IV stopped  2) 0 ml liquid/blood aspirated from IV site.  3) Patient Teaching Instruction Sheet reviewed with Bethany [name].  A) Apply cold to area for 15 to 20 minutes 4 times daily for the next 48 hours B) Elevate the affected site for the next 48 hours. C) Coban dressing applied to area.   D) Bethany [name] was instructed to call [phone] if she has questions or notes acute changes to the area.  Review of Systems  Constitutional: Negative for chills, diaphoresis and fever.  HENT: Negative for trouble swallowing and voice change.   Respiratory: Negative for cough, chest tightness, shortness of breath and wheezing.   Cardiovascular: Negative for chest pain and palpitations.  Gastrointestinal: Negative for abdominal pain, constipation, diarrhea, nausea and vomiting.  Musculoskeletal: Negative for back pain and myalgias.  Neurological: Negative for dizziness, light-headedness and headaches.    Physical Exam Constitutional:      General: She is not in acute distress.    Appearance: She is not diaphoretic.  HENT:     Head: Normocephalic and atraumatic.  Cardiovascular:     Rate and Rhythm: Normal rate and regular rhythm.     Heart sounds: Normal heart sounds. No murmur. No friction rub. No gallop.   Pulmonary:     Effort: Pulmonary effort is normal. No respiratory distress.     Breath sounds: Normal breath sounds. No wheezing or rales.  Skin:    General: Skin is warm and dry.     Findings: No erythema or rash.     Comments: There is a raised soft mass measuring 3 x 2.5 cm in the right anterior mid forearm.  Neurological:     Mental Status: She is alert.  Psychiatric:        Mood and Affect: Mood normal.        Behavior: Behavior normal.        Thought Content: Thought content normal.      Judgment: Judgment normal.     Sandi Mealy, MHS, PA-C

## 2019-01-04 ENCOUNTER — Telehealth: Payer: Self-pay | Admitting: Hematology

## 2019-01-04 NOTE — Telephone Encounter (Signed)
[facility] 9/16 f/u moved from [place] to [place]. Start time remains the same.

## 2019-01-07 ENCOUNTER — Telehealth: Payer: Self-pay

## 2019-01-07 NOTE — Telephone Encounter (Signed)
Contacted patient and left message with return number and to call back with any questions or concerns.

## 2019-01-07 NOTE — Telephone Encounter (Signed)
-----   Message from Truitt Merle, MD sent at 01/06/2019  9:50 PM EDT ----- Please let pt know her lab results, anemia is stable, iron level slightly better than last month. She had iv extravasation last week, I will see her next time in Sep before infusion, thanks   Truitt Merle  01/06/2019

## 2019-01-16 ENCOUNTER — Other Ambulatory Visit: Payer: Self-pay | Admitting: Internal Medicine

## 2019-01-16 DIAGNOSIS — R7989 Other specified abnormal findings of blood chemistry: Secondary | ICD-10-CM

## 2019-01-23 ENCOUNTER — Ambulatory Visit
Admission: RE | Admit: 2019-01-23 | Discharge: 2019-01-23 | Disposition: A | Discharge status: Home / Self Care | Payer: Medicare Other | Source: Ambulatory Visit | Attending: Internal Medicine | Admitting: Internal Medicine

## 2019-01-23 DIAGNOSIS — R7989 Other specified abnormal findings of blood chemistry: Secondary | ICD-10-CM

## 2019-01-29 NOTE — Progress Notes (Signed)
[facility]   Telephone:(336) [phone] Fax:(336) [phone]   Clinic Follow up Note   Patient Care Team: Wenda Low, MD as PCP - General (Internal Medicine) Truitt Merle, MD as Consulting Physician (Hematology) Wilford Corner, MD as Consulting Physician (Gastroenterology) Angelia Mould, MD as Consulting Physician (Vascular Surgery) Date of service: 01/30/2019   CHIEF COMPLAINT: f/u anemia   CURRENT THERAPY: IV Feraheme PRN (if ferritin <100)  INTERVAL HISTORY: Bethany [name] returns for f/u as scheduled. She was last seen by Dr. Burr Medico on 12/05/18. She continues iron treatment PRN, received Venofer on 01/02/19. She experienced IV extravasation that resolved. She notes mild intermittent discomfort on right hand from a previous IV. She tolerated treatment well otherwise. Fatigue fluctuates in general, at baseline today. Appetite is normal for her. She has 9 lbs unintentional weight loss since 11/2018. She has intermittent but increasing nausea that occurs "out of the blue." She vomits occasionally. Also has increasing gas. She is still able to eat and drink. Denies GERD. Stools are black last 2 days with an episode of blood in stool last month. This occurs periodically since 2018. Denies recent epistaxis. She has colonoscopy scheduled with Dr. Alan Ripper on 10/21, also has a fibroscan on 10/8 to f/u on a recent liver US per her PCP. She has intermittent RLQ pain that she attributes to drinking soda. Her usual back and leg pains are stable. She denies recent fever but does report drenching sweats that have progressed over months. She wonders if this is related to her pain level. Denies adenopathy. Denies recent infection, sore throat, cough, chest pain, dyspnea.     MEDICAL HISTORY:  Past Medical History:  Diagnosis Date  . Acute renal failure ([place])   . Arthritis   . Diabetes mellitus without complication ([place])    type II   . Hypercalcemia   . Hypertension   . Renal disorder   .  Single kidney   . Thrombosis     SURGICAL HISTORY: Past Surgical History:  Procedure Laterality Date  . ABDOMINAL HYSTERECTOMY    . blood clots removed from descending aorta     . CHOLECYSTECTOMY    . COLONOSCOPY    . COLONOSCOPY WITH PROPOFOL N/A 04/17/2017   Procedure: COLONOSCOPY WITH PROPOFOL;  Surgeon: Wilford Corner, MD;  Location: [facility] END[facility]COPY;  Service: Endoscopy;  Laterality: N/A;  . ESOPHAGOGASTRODUODENOSCOPY (EGD) WITH PROPOFOL N/A 04/17/2017   Procedure: ESOPHAGOGASTRODUODENOSCOPY (EGD) WITH PROPOFOL;  Surgeon: Wilford Corner, MD;  Location: [facility];  Service: Endoscopy;  Laterality: N/A;  . NEPHRECTOMY RECIPIENT      I have reviewed the social history and family history with the patient and they are unchanged from previous note.  ALLERGIES:  is allergic to contrast media [iodinated diagnostic agents].  MEDICATIONS:  Current Outpatient Medications  Medication Sig Dispense Refill  . amLODipine (NORVASC) 5 MG tablet Take 1 tablet (5 mg total) by mouth daily. 30 tablet 0  . cholecalciferol (VITAMIN D) 1000 units tablet Take 1 tablet (1,000 Units total) by mouth once a week.    . ciclopirox (PENLAC) 8 % solution Apply topically at bedtime. Apply over nail and surrounding skin. Apply daily over previous coat. After seven (7) days, may remove with alcohol and continue cycle. 6.6 mL 11  . Dulaglutide (TRULICITY Deltana) Inject into the skin.    Marland Kitchen gabapentin (NEURONTIN) 600 MG tablet Take 600 mg by mouth 4 (four) times daily.     . hydrALAZINE (APRESOLINE) 25 MG tablet Take 25 mg by  mouth 3 (three) times daily.    . Insulin Glargine (TOUJEO SOLOSTAR) 300 UNIT/ML SOPN Inject 40 Units into the skin at bedtime.     . insulin lispro (HUMALOG KWIKPEN) 100 UNIT/ML KiwkPen Inject 22-24 Units into the skin daily as needed. Inject 24 units with breakfast, 22 units with lunch, and 24 units with dinner    . Ipratropium-Albuterol (COMBIVENT) 20-100 MCG/ACT AERS respimat Inhale 1 puff  into the lungs every 6 (six) hours as needed for wheezing or shortness of breath. 1 Inhaler 0  . levothyroxine (SYNTHROID, LEVOTHROID) 25 MCG tablet Take 75 mcg by mouth daily before breakfast.     . metoprolol (LOPRESSOR) 50 MG tablet Take 50 mg by mouth 2 (two) times daily.     . nicotine (NICODERM CQ - DOSED IN MG/24 HOURS) 21 mg/24hr patch Place 1 patch (21 mg total) onto the skin daily. 28 patch 0  . oxyCODONE (XTAMPZA ER PO) Take by mouth. Unsure of dose    . oxyCODONE-acetaminophen (PERCOCET) 10-325 MG tablet Take 1 tablet by mouth every 6 (six) hours as needed for pain.    . pantoprazole (PROTONIX) 40 MG tablet Take 40 mg by mouth daily.    . polyethylene glycol (MIRALAX / GLYCOLAX) packet Take 17 g by mouth daily. 30 each 3  . PRALUENT 75 MG/ML SOPN Inject 75 mg as directed every 14 (fourteen) days.  4  . pregabalin (LYRICA) 25 MG capsule Take 25 mg by mouth daily.     No current facility-administered medications for this visit.     PHYSICAL EXAMINATION: ECOG PERFORMANCE STATUS: 1 - Symptomatic but completely ambulatory  Vitals:   01/30/19 1146  BP: (!) 165/60  Pulse: 69  Resp: 18  Temp: 98.2 F (36.8 C)  SpO2: 100%   Filed Weights   01/30/19 1146  Weight: 181 lb 14.4 oz (82.5 kg)    GENERAL:alert, no distress and comfortable SKIN: no rash  EYES:  sclera clear LYMPH:  no palpable cervical, supraclavicular, axillary, or inguinal lymphadenopathy LUNGS: clear with normal breathing effort HEART: regular rate & rhythm, no lower extremity edema ABDOMEN: abdomen soft, non-tender and normal bowel sounds. No palpable mass or adenopathy in the groin  Musculoskeletal:no cyanosis of digits  NEURO: alert & oriented x 3 with fluent speech  LABORATORY DATA:  I have reviewed the data as listed CBC Latest Ref Rng & Units 01/30/2019 01/02/2019 12/05/2018  WBC 4.0 - 10.5 K/uL 12.0(H) 9.5 10.1  Hemoglobin 12.0 - 15.0 g/dL 11.1(L) 10.4(L) 10.2(L)  Hematocrit 36.0 - 46.0 % 37.5 35.1(L)  34.2(L)  Platelets 150 - 400 K/uL 301 348 280     CMP Latest Ref Rng & Units 01/30/2019 08/03/2018 11/07/2017  Glucose 70 - 99 mg/dL 85 161(H) 130(H)  BUN 8 - 23 mg/dL 25(H) 16 21  Creatinine 0.44 - 1.00 mg/dL 1.19(H) 1.27(H) 1.18(H)  Sodium 135 - 145 mmol/L 141 137 139  Potassium 3.5 - 5.1 mmol/L 4.3 4.0 4.9  Chloride 98 - 111 mmol/L 106 103 104  CO2 22 - 32 mmol/L '26 23 24  ' Calcium 8.9 - 10.3 mg/dL 10.5(H) 9.9 10.6(H)  Total Protein 6.5 - 8.1 g/dL 7.2 7.0 7.1  Total Bilirubin 0.3 - 1.2 mg/dL 0.3 0.2(L) 0.2(L)  Alkaline Phos 38 - 126 U/L 185(H) 166(H) 121  AST 15 - 41 U/L 45(H) 23 27  ALT 0 - 44 U/L 41 36 28      RADIOGRAPHIC STUDIES: I have personally reviewed the radiological images as listed and agreed  with the findings in the report. No results found.   ASSESSMENT & PLAN: ABCDE ONEIL is a 68 y.o. female with   1. Iron deficient anemia, secondary to chronic blood loss from GI AVM  -Pt has h/o intestinal AVM with associated chronic GI blood loss resulting in iron deficiency and blood loss anemia. She has required blood transfusions in the past in 2013 and in 2018 and frequent hospital admission for anemia. -She had intermittent epistaxis, but no skin or mucosa telangiectasia, no significant family history of bleeding disorder, she does not meet the diagnosis criteria for hereditary hemorrhagic telangiectasia (HHT). -She is not on oral iron, due to the lack of benefit. -Currently being treated withIV Ferahem PRN to prevent significant anemiawith goal of Ferritin 100-200 range; recently changed to IV Venofer due to insurance preferance  -Ms. Lachapelle appears stable today. She continues IV iron replacement. Ferritin today is 50, last infusion 01/02/19. -She recently changed GI to Dr. Alan Ripper. Has a colonoscopy coming up next month. No plans to endoscopy at this time. Given she reports recent bloody and black stool, will send my note to determine if she also needs EGD for h/o AVM  -She will proceed with IV Venofer today -continue close follow up labs and monthly IV iron if Ferritin <100 -She has poor venous access, requires frequent IV sticks. After today's visit I tried calling her to discuss PAC but she was not reachable. Will f/u on this at office visit next month.  2. Chronic GI Bleeding r/t AVM, intermittent epistaxis -Had colonoscopy/EGD by Dr. Michail Sermon on 04/17/17, no signs of bleeding -She has been having intermittent black stool, frequent BMs. I recommend she monitor her stool and energy -she does report bloody and black stool recently, she changed GI to Dr. Alan Ripper at [facility] and has colonoscopy next month. Currently no plans for EGD per patient. I will fax my note to her to see if she also needs EGD.  3. H/o of arterial thrombosis -S/p unilateral nephrectomy due to damage from thrombosis; managed by vascular physician, Dr. Doren Custard  -patient does not currently receive therapeutic anticoagulation due to history of severe GI bleeding.  4. COPD, HTN, CKD stage III -Only has 1 kidney. Will check her kidney function every 6 months  -f/u with PCP -BP stable today  6. DM with Peripheral Neuropathy  -stable  7. Unintentional weight loss, night sweats  -increasing over a period of months. She does report intermittent nausea which may account for reduced po intake.  -WBC 12.0, with mild neutrophilia. She has had periodic increases to [place] in the past. She denies recent infection. No adenopathy on exam -patient suggests sweats could be related to her fluctuating pain level -will monitor closely  8. Liver lesion -a 3 cm hypoechoic lesion noted in left lobe on recent ABD [place] on 01/23/19.  -AST and alk phos are mildly elevated. Normal Tbili -this is being worked up by PCP, will follow results   PLAN -Labs reviewed, ferritin 50 today (goal ferritin >100) -Proceed with IV Venofer today -Return for lab, f/u, and infusion next month -Discuss PAC at  next visit  -Colonoscopy per Dr. Alan Ripper, GI on 10/21; will fax my note to determine if EGD is also needed  -Continue liver work up per PCP  All questions were answered. The patient knows to call the clinic with any problems, questions or concerns. No barriers to learning was detected. I spent 20 minutes counseling the patient face to face. The total  time spent in the appointment was 25 minutes and more than 50% was on counseling and review of test results     Alla Feeling, NP 02/04/19

## 2019-01-30 ENCOUNTER — Other Ambulatory Visit: Payer: Self-pay

## 2019-01-30 ENCOUNTER — Other Ambulatory Visit: Payer: Medicare Other

## 2019-01-30 ENCOUNTER — Inpatient Hospital Stay (HOSPITAL_BASED_OUTPATIENT_CLINIC_OR_DEPARTMENT_OTHER): Payer: Medicare Other | Admitting: Medical

## 2019-01-30 ENCOUNTER — Other Ambulatory Visit: Payer: Self-pay | Admitting: Medical

## 2019-01-30 ENCOUNTER — Inpatient Hospital Stay (HOSPITAL_BASED_OUTPATIENT_CLINIC_OR_DEPARTMENT_OTHER): Payer: Medicare Other | Admitting: Nurse Practitioner

## 2019-01-30 ENCOUNTER — Inpatient Hospital Stay: Payer: Medicare Other | Attending: Hematology

## 2019-01-30 ENCOUNTER — Ambulatory Visit: Payer: Medicare Other

## 2019-01-30 ENCOUNTER — Inpatient Hospital Stay: Payer: Medicare Other

## 2019-01-30 VITALS — BP 154/60 | HR 57 | Temp 98.7°F | Resp 20

## 2019-01-30 VITALS — BP 165/60 | HR 69 | Temp 98.2°F | Resp 18 | Ht 65.0 in | Wt 181.9 lb

## 2019-01-30 DIAGNOSIS — M545 Low back pain, unspecified: Secondary | ICD-10-CM

## 2019-01-30 DIAGNOSIS — D500 Iron deficiency anemia secondary to blood loss (chronic): Secondary | ICD-10-CM

## 2019-01-30 DIAGNOSIS — N183 Chronic kidney disease, stage 3 (moderate): Secondary | ICD-10-CM | POA: Diagnosis not present

## 2019-01-30 DIAGNOSIS — Q2733 Arteriovenous malformation of digestive system vessel: Secondary | ICD-10-CM | POA: Diagnosis not present

## 2019-01-30 DIAGNOSIS — I129 Hypertensive chronic kidney disease with stage 1 through stage 4 chronic kidney disease, or unspecified chronic kidney disease: Secondary | ICD-10-CM | POA: Insufficient documentation

## 2019-01-30 DIAGNOSIS — J449 Chronic obstructive pulmonary disease, unspecified: Secondary | ICD-10-CM | POA: Diagnosis not present

## 2019-01-30 DIAGNOSIS — G8929 Other chronic pain: Secondary | ICD-10-CM

## 2019-01-30 DIAGNOSIS — K5520 Angiodysplasia of colon without hemorrhage: Secondary | ICD-10-CM

## 2019-01-30 DIAGNOSIS — E1142 Type 2 diabetes mellitus with diabetic polyneuropathy: Secondary | ICD-10-CM | POA: Insufficient documentation

## 2019-01-30 LAB — CBC WITH DIFFERENTIAL (CANCER CENTER ONLY)
Abs Immature Granulocytes: 0.09 10*3/uL — ABNORMAL HIGH (ref 0.00–0.07)
Basophils Absolute: 0 10*3/uL (ref 0.0–0.1)
Basophils Relative: 0 %
Eosinophils Absolute: 0.1 10*3/uL (ref 0.0–0.5)
Eosinophils Relative: 1 %
HCT: 37.5 % (ref 36.0–46.0)
Hemoglobin: 11.1 g/dL — ABNORMAL LOW (ref 12.0–15.0)
Immature Granulocytes: 1 %
Lymphocytes Relative: 11 %
Lymphs Abs: 1.3 10*3/uL (ref 0.7–4.0)
MCH: 23.8 pg — ABNORMAL LOW (ref 26.0–34.0)
MCHC: 29.6 g/dL — ABNORMAL LOW (ref 30.0–36.0)
MCV: 80.3 fL (ref 80.0–100.0)
Monocytes Absolute: 0.7 10*3/uL (ref 0.1–1.0)
Monocytes Relative: 5 %
Neutro Abs: 9.9 10*3/uL — ABNORMAL HIGH (ref 1.7–7.7)
Neutrophils Relative %: 82 %
Platelet Count: 301 10*3/uL (ref 150–400)
RBC: 4.67 MIL/uL (ref 3.87–5.11)
RDW: 17.5 % — ABNORMAL HIGH (ref 11.5–15.5)
WBC Count: 12 10*3/uL — ABNORMAL HIGH (ref 4.0–10.5)
nRBC: 0 % (ref 0.0–0.2)

## 2019-01-30 LAB — CMP (CANCER CENTER ONLY)
ALT: 41 U/L (ref 0–44)
AST: 45 U/L — ABNORMAL HIGH (ref 15–41)
Albumin: 4.1 g/dL (ref 3.5–5.0)
Alkaline Phosphatase: 185 U/L — ABNORMAL HIGH (ref 38–126)
Anion gap: 9 (ref 5–15)
BUN: 25 mg/dL — ABNORMAL HIGH (ref 8–23)
CO2: 26 mmol/L (ref 22–32)
Calcium: 10.5 mg/dL — ABNORMAL HIGH (ref 8.9–10.3)
Chloride: 106 mmol/L (ref 98–111)
Creatinine: 1.19 mg/dL — ABNORMAL HIGH (ref 0.44–1.00)
GFR, Est AFR Am: 54 mL/min — ABNORMAL LOW (ref >60)
GFR, Estimated: 47 mL/min — ABNORMAL LOW (ref >60)
Glucose, Bld: 85 mg/dL (ref 70–99)
Potassium: 4.3 mmol/L (ref 3.5–5.1)
Sodium: 141 mmol/L (ref 135–145)
Total Bilirubin: 0.3 mg/dL (ref 0.3–1.2)
Total Protein: 7.2 g/dL (ref 6.5–8.1)

## 2019-01-30 LAB — FERRITIN: Ferritin: 50 ng/mL (ref 11–307)

## 2019-01-30 LAB — IRON AND TIBC
Iron: 34 ug/dL — ABNORMAL LOW (ref 41–142)
Saturation Ratios: 10 % — ABNORMAL LOW (ref 21–57)
TIBC: 358 ug/dL (ref 236–444)
UIBC: 323 ug/dL (ref 120–384)

## 2019-01-30 MED ORDER — OXYCODONE-ACETAMINOPHEN 5-325 MG PO TABS
ORAL_TABLET | ORAL | Status: AC
  Filled 2019-01-30: qty 1

## 2019-01-30 MED ORDER — OXYCODONE-ACETAMINOPHEN 5-325 MG PO TABS
2.0000 | ORAL_TABLET | Freq: Once | ORAL | Status: AC
  Administered 2019-01-30: 2 via ORAL

## 2019-01-30 MED ORDER — SODIUM CHLORIDE 0.9 % IV SOLN
200.0000 mg | Freq: Once | INTRAVENOUS | Status: AC
  Administered 2019-01-30: 200 mg via INTRAVENOUS
  Filled 2019-01-30: qty 10

## 2019-01-30 MED ORDER — SODIUM CHLORIDE 0.9 % IV SOLN
Freq: Once | INTRAVENOUS | Status: AC
  Administered 2019-01-30: 13:00:00 via INTRAVENOUS
  Filled 2019-01-30: qty 250

## 2019-01-30 NOTE — Progress Notes (Signed)
Pt has hx of chronic back pain, rates her pain as a 10, states she has been here a very long time today without her medication, takes oxycodone at home, is requesting something for pain here.  Shelia Media PA informed, is at bedside.  Pt C/O pain @ IV site, site appears WNL, no swelling or redness noted.  Venofer infusion stopped, saline bolus given.Marland Kitchen  Pt [place] home in stable condition, VS WNL, back pain is a 6 after pain meds received.

## 2019-01-30 NOTE — Patient Instructions (Signed)

## 2019-01-31 ENCOUNTER — Telehealth: Payer: Self-pay | Admitting: Nurse Practitioner

## 2019-01-31 NOTE — Telephone Encounter (Signed)
No los per 9/16.

## 2019-01-31 NOTE — Progress Notes (Signed)
The patient was seen in the infusion room today as she was receiving IV iron.  She reported that she was had a increased back pain.  She had left her pain medications at home.  She also reported having significant pain at the site of her IV in her right dorsal hand.  She had received greater than two thirds of her IV iron infusion.  She reported that the pain was quite significant.  Examination of the site did not show any evidence of an infusion extravasation.  Based on this the less than one third iron reports remaining was discontinued with her IV cold.  She was given 2 Percocet p.o. x1.  Sandi Mealy, MHS, PA-C Physician Assistant

## 2019-02-04 ENCOUNTER — Encounter: Payer: Self-pay | Admitting: Nurse Practitioner

## 2019-02-05 ENCOUNTER — Telehealth: Payer: Self-pay | Admitting: *Deleted

## 2019-02-05 NOTE — Telephone Encounter (Signed)
Per Cira Rue, NP, faxed last note to Dr. Koleen Distance at [facility]. Transmission log was received.

## 2019-02-05 NOTE — Telephone Encounter (Signed)
-----   Message from Alla Feeling, NP sent at 02/04/2019 11:53 AM EDT ----- Please fax my note to new GI Dr. Koleen Distance at [facility]. She has periodic bloody/black stool with h/o GI AVM. She receives monthly IV Iron. She may also need EGD when she has colonoscopy next month, but will defer to GI.  Thanks, Regan Rakers

## 2019-02-22 NOTE — Progress Notes (Signed)
[place]   [phone] Fax:(336) [phone]   Clinic Follow up Note   Patient Care Team: Wenda Low, MD as PCP - General (Internal Medicine) Truitt Merle, MD as Consulting Physician (Hematology) Angelia Mould, MD as Consulting Physician (Vascular Surgery) Koleen Distance, MD as Referring Physician (Gastroenterology)  Date of Service:  02/27/2019  CHIEF COMPLAINT: F/u of Anemia  CURRENT THERAPY:  iv Feraheme as needed (if ferritin <100). Changed to IV Venofer on 01/02/19  INTERVAL HISTORY:  Bethany [name] is here for a follow up and anemia. She presents to the clinic alone. She has not significant acid reflux for the past month. She has been on Protonix in the morning. She notes she has been on this for 10 years given by her GI when [place]. She does see GI Dr Alan Ripper in [facility]. She also notes she has been having a lot of gas. She has not mentioned this to her GI. She notes she had fibroscan which showed Fatty liver from access fat. She plans to have colonoscopy on 03/06/19.  She notes she sees PT for her chronic back pain. She still goes to [facility]. She notes she has dark stool intermittently, last time was 2 days ago. Same days she had frequent BMs and has not had another BM since then. These episodes occur about once a month. She notes only bowel pain with BMs but then will resolve. She is fine to do exercise as she would like to restart it.    REVIEW OF SYSTEMS:   Constitutional: Denies fevers, chills or abnormal weight loss Eyes: Denies blurriness of vision Ears, nose, mouth, throat, and face: Denies mucositis or sore throat Respiratory: Denies cough, dyspnea or wheezes Cardiovascular: Denies palpitation, chest discomfort or lower extremity swelling Gastrointestinal: (+) Acid reflux and gas (+) Intermittent GI bleeding and diarrhea Skin: Denies abnormal skin rashes MSK: (+) Chronic back pain Lymphatics:  Denies new lymphadenopathy or easy bruising Neurological:Denies numbness, tingling or new weaknesses Behavioral/Psych: Mood is stable, no new changes  All other systems were reviewed with the patient and are negative.  MEDICAL HISTORY:  Past Medical History:  Diagnosis Date  . Acute renal failure ([place])   . Arthritis   . Diabetes mellitus without complication ([place])    type II   . Hypercalcemia   . Hypertension   . Renal disorder   . Single kidney   . Thrombosis     SURGICAL HISTORY: Past Surgical History:  Procedure Laterality Date  . ABDOMINAL HYSTERECTOMY    . blood clots removed from descending aorta     . CHOLECYSTECTOMY    . COLONOSCOPY    . COLONOSCOPY WITH PROPOFOL N/A 04/17/2017   Procedure: COLONOSCOPY WITH PROPOFOL;  Surgeon: Wilford Corner, MD;  Location: [facility];  Service: Endoscopy;  Laterality: N/A;  . ESOPHAGOGASTRODUODENOSCOPY (EGD) WITH PROPOFOL N/A 04/17/2017   Procedure: ESOPHAGOGASTRODUODENOSCOPY (EGD) WITH PROPOFOL;  Surgeon: Wilford Corner, MD;  Location: [facility];  Service: Endoscopy;  Laterality: N/A;  . NEPHRECTOMY RECIPIENT      I have reviewed the social history and family history with the patient and they are unchanged from previous note.  ALLERGIES:  is allergic to contrast media [iodinated diagnostic agents].  MEDICATIONS:  Current Outpatient Medications  Medication Sig Dispense Refill  . amLODipine (NORVASC) 5 MG tablet Take 1 tablet (5 mg total) by mouth daily. 30 tablet 0  . Dulaglutide (TRULICITY [place]) Inject into the skin.    Marland Kitchen  ergocalciferol (VITAMIN D2) 1.25 MG (50000 UT) capsule Take 50,000 Units by mouth once a week.    . gabapentin (NEURONTIN) 600 MG tablet Take 600 mg by mouth 4 (four) times daily.     . hydrALAZINE (APRESOLINE) 25 MG tablet Take 25 mg by mouth 3 (three) times daily.    . Insulin Glargine (TOUJEO SOLOSTAR) 300 UNIT/ML SOPN Inject 40 Units into the skin at bedtime.     . insulin lispro (HUMALOG KWIKPEN)  100 UNIT/ML KiwkPen Inject 22-24 Units into the skin daily as needed. Inject 24 units with breakfast, 22 units with lunch, and 24 units with dinner    . Ipratropium-Albuterol (COMBIVENT) 20-100 MCG/ACT AERS respimat Inhale 1 puff into the lungs every 6 (six) hours as needed for wheezing or shortness of breath. 1 Inhaler 0  . levothyroxine (SYNTHROID, LEVOTHROID) 25 MCG tablet Take 75 mcg by mouth daily before breakfast.     . magnesium 30 MG tablet Take 30 mg by mouth 2 (two) times daily.    . metoprolol (LOPRESSOR) 50 MG tablet Take 50 mg by mouth 2 (two) times daily.     Marland Kitchen oxyCODONE-acetaminophen (PERCOCET) 10-325 MG tablet Take 1 tablet by mouth every 6 (six) hours as needed for pain.    . pantoprazole (PROTONIX) 40 MG tablet Take 40 mg by mouth daily.    Marland Kitchen PRALUENT 75 MG/ML SOPN Inject 75 mg as directed every 14 (fourteen) days.  4  . pregabalin (LYRICA) 25 MG capsule Take 25 mg by mouth daily.     No current facility-administered medications for this visit.     PHYSICAL EXAMINATION: ECOG PERFORMANCE STATUS: 1 - Symptomatic but completely ambulatory  Vitals:   02/27/19 1036  BP: (!) 132/54  Pulse: 66  Resp: 17  Temp: 99.1 F (37.3 C)  SpO2: 100%   Filed Weights   02/27/19 1036  Weight: 183 lb 4.8 oz (83.1 kg)    GENERAL:alert, no distress and comfortable SKIN: skin color, texture, turgor are normal, no rashes or significant lesions EYES: normal, Conjunctiva are pink and non-injected, sclera clear  NECK: supple, thyroid normal size, non-tender, without nodularity LYMPH:  no palpable lymphadenopathy in the cervical, axillary  LUNGS: clear to auscultation and percussion with normal breathing effort HEART: regular rate & rhythm and no murmurs and no lower extremity edema ABDOMEN:abdomen soft, non-tender and normal bowel sounds Musculoskeletal:no cyanosis of digits and no clubbing  NEURO: alert & oriented x 3 with fluent speech, no focal motor/sensory deficits  LABORATORY  DATA:  I have reviewed the data as listed CBC Latest Ref Rng & Units 02/27/2019 01/30/2019 01/02/2019  WBC 4.0 - 10.5 K/uL 8.5 12.0(H) 9.5  Hemoglobin 12.0 - 15.0 g/dL 8.5(L) 11.1(L) 10.4(L)  Hematocrit 36.0 - 46.0 % 29.9(L) 37.5 35.1(L)  Platelets 150 - 400 K/uL 341 301 348     CMP Latest Ref Rng & Units 01/30/2019 08/03/2018 11/07/2017  Glucose 70 - 99 mg/dL 85 161(H) 130(H)  BUN 8 - 23 mg/dL 25(H) 16 21  Creatinine 0.44 - 1.00 mg/dL 1.19(H) 1.27(H) 1.18(H)  Sodium 135 - 145 mmol/L 141 137 139  Potassium 3.5 - 5.1 mmol/L 4.3 4.0 4.9  Chloride 98 - 111 mmol/L 106 103 104  CO2 22 - 32 mmol/L 26 23 24   Calcium 8.9 - 10.3 mg/dL 10.5(H) 9.9 10.6(H)  Total Protein 6.5 - 8.1 g/dL 7.2 7.0 7.1  Total Bilirubin 0.3 - 1.2 mg/dL 0.3 0.2(L) 0.2(L)  Alkaline Phos 38 - 126 U/L 185(H) 166(H) 121  AST 15 - 41 U/L 45(H) 23 27  ALT 0 - 44 U/L 41 36 28      RADIOGRAPHIC STUDIES: I have personally reviewed the radiological images as listed and agreed with the findings in the report. No results found.   ASSESSMENT & PLAN:  Bethany [name] is a 68 y.o. female with   1. 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 She has required blood transfusions in the past in 2013 and in 2018 and frequent hospital admission for anemia. -She has intermittent epistaxis, but no skin or mucosa telangiectasia, no significant family history of bleeding disorder, she does not meet the diagnosis criteria for hereditary hemorrhagic telangiectasia (HHT). -She is not on oral iron, due to the lack of benefit. -Currently being treated withIV Iron to prevent significant anemiawith goal of Ferritin 100-200 range.  -Last colonoscopy and endoscopy in 04/2017.  -Last IV Feraheme on 11/02/18.She was switched to IV Venofer on 01/02/19 due to her insurance coverage. Last dose 01/30/19.  -Labs  reviewed, CBC shows Hg 8.5. Iron panel still pending.  -Given intermittent GI bleeding, last time 2 days ago, and worsening anemia, she will need more iv iron. I discussed increasing IV Venofer to weekly for the next 5 weeks starting today. She is agreeable.  -She notes she has been having issues accessing had veins lately. I discussed the option of PAC placement given she will have more frequent IV iron. She will think about it.  -Monitor labs every 2 weeks, f/u in 3 weeks   2. Chronic GI Bleeding, intermittent epistaxis -Has intestinal AVM history  -Had colonoscopy/EGD by Dr. Michail Sermon on 04/17/17 which showed gastritis, diverticulosis and internal hemorrhoids, all benign with no obvious signs of bleeding at that time. -She does not meet the diagnosis criteria for hereditary hemorrhagic telangiectasia (HHT) -She has been having intermittent black stool, frequent BMs about once a month.  -Last episode was 2 days ago. She has not had a BM since. I advised her to take Miralax to help her have Bowel movement.  -I advised her to contact our clinic when this happens as this indicates GI bleeding and may need IV Iron.  -I also recommend she follow up with her GI Dr. Alan Ripper for upper endoscopy. She does plan to have colonoscopy on 03/06/19.    3. H/o of arterial thrombosis -S/p unilateral nephrectomy due to damage from thrombosis -Managed by vascular physician, Dr. Doren Custard  -patient does not currently receive therapeutic anticoagulation due to history of severe GI bleeding. -Previous work-up for lupus anticoagulantand antiphospholipid syndromewerenegative.  4. Smoking Cessation  -Currently smokes 1.5packs over 2 days currently. She has been smoking for 40 years. -Irepeatedly encouraged her to continue to try to quit completely, she understand and is willing to try.   5. COPD, HTN, CKD stage III -Only has 1 kidney. Will check her kidney function every 6 months  -f/u with PCP -BP  at132/54today (02/27/19), I encourage her to monitor at home  6. DM with Peripheral Neuropathy  -She notes her neuropathy has gotten worse and has been experienced upper left leg pain from this.  -She is currently on 600mg  Gabapentin 4 times a day.   7. Chronic Back Pain  -She is being seen by [facility]  -She is currently doing PT  8. Acid Reflux, Gas  -Has been significant for  the past month.  -She has been on Protonix for 10 years but not helping lately.  -I encouraged her to f/u with her GI about changing medication. She may need Upper Endoscopy soon  -She plans to have colonoscopy on 03/06/19.    PLAN: -IV Venofer today  -IV Venofer weekly X4 -Lab in 1, 3 weeks  -F/u in 3 weeks    No problem-specific Assessment & Plan notes found for this encounter.   No orders of the defined types were placed in this encounter.  All questions were answered. The patient knows to call the clinic with any problems, questions or concerns. No barriers to learning was detected. I spent 15 minutes counseling the patient face to face. The total time spent in the appointment was 20 minutes and more than 50% was on counseling and review of test results     Truitt Merle, MD 02/27/2019   I, Joslyn Devon, am acting as scribe for Truitt Merle, MD.   I have reviewed the above documentation for accuracy and completeness, and I agree with the above.

## 2019-02-27 ENCOUNTER — Ambulatory Visit: Payer: Medicare Other | Admitting: Hematology

## 2019-02-27 ENCOUNTER — Ambulatory Visit: Payer: Medicare Other

## 2019-02-27 ENCOUNTER — Inpatient Hospital Stay (HOSPITAL_BASED_OUTPATIENT_CLINIC_OR_DEPARTMENT_OTHER): Payer: Medicare Other | Admitting: Hematology

## 2019-02-27 ENCOUNTER — Inpatient Hospital Stay: Payer: Medicare Other | Attending: Hematology

## 2019-02-27 ENCOUNTER — Encounter: Payer: Self-pay | Admitting: Hematology

## 2019-02-27 ENCOUNTER — Other Ambulatory Visit: Payer: Medicare Other

## 2019-02-27 ENCOUNTER — Inpatient Hospital Stay: Payer: Medicare Other

## 2019-02-27 ENCOUNTER — Other Ambulatory Visit: Payer: Self-pay

## 2019-02-27 VITALS — BP 144/51 | HR 80 | Temp 99.1°F | Resp 16

## 2019-02-27 VITALS — BP 132/54 | HR 66 | Temp 99.1°F | Resp 17 | Ht 65.0 in | Wt 183.3 lb

## 2019-02-27 DIAGNOSIS — D500 Iron deficiency anemia secondary to blood loss (chronic): Secondary | ICD-10-CM | POA: Diagnosis present

## 2019-02-27 DIAGNOSIS — K5520 Angiodysplasia of colon without hemorrhage: Secondary | ICD-10-CM

## 2019-02-27 DIAGNOSIS — Q2733 Arteriovenous malformation of digestive system vessel: Secondary | ICD-10-CM | POA: Insufficient documentation

## 2019-02-27 LAB — IRON AND TIBC
Iron: 15 ug/dL — ABNORMAL LOW (ref 41–142)
Saturation Ratios: 4 % — ABNORMAL LOW (ref 21–57)
TIBC: 351 ug/dL (ref 236–444)
UIBC: 336 ug/dL (ref 120–384)

## 2019-02-27 LAB — CBC WITH DIFFERENTIAL (CANCER CENTER ONLY)
Abs Immature Granulocytes: 0.04 10*3/uL (ref 0.00–0.07)
Basophils Absolute: 0.1 10*3/uL (ref 0.0–0.1)
Basophils Relative: 1 %
Eosinophils Absolute: 0.2 10*3/uL (ref 0.0–0.5)
Eosinophils Relative: 3 %
HCT: 29.9 % — ABNORMAL LOW (ref 36.0–46.0)
Hemoglobin: 8.5 g/dL — ABNORMAL LOW (ref 12.0–15.0)
Immature Granulocytes: 1 %
Lymphocytes Relative: 16 %
Lymphs Abs: 1.4 10*3/uL (ref 0.7–4.0)
MCH: 22 pg — ABNORMAL LOW (ref 26.0–34.0)
MCHC: 28.4 g/dL — ABNORMAL LOW (ref 30.0–36.0)
MCV: 77.5 fL — ABNORMAL LOW (ref 80.0–100.0)
Monocytes Absolute: 0.7 10*3/uL (ref 0.1–1.0)
Monocytes Relative: 8 %
Neutro Abs: 6.2 10*3/uL (ref 1.7–7.7)
Neutrophils Relative %: 71 %
Platelet Count: 341 10*3/uL (ref 150–400)
RBC: 3.86 MIL/uL — ABNORMAL LOW (ref 3.87–5.11)
RDW: 19 % — ABNORMAL HIGH (ref 11.5–15.5)
WBC Count: 8.5 10*3/uL (ref 4.0–10.5)
nRBC: 0 % (ref 0.0–0.2)

## 2019-02-27 LAB — FERRITIN: Ferritin: 40 ng/mL (ref 11–307)

## 2019-02-27 MED ORDER — SODIUM CHLORIDE 0.9 % IV SOLN
Freq: Once | INTRAVENOUS | Status: AC
  Administered 2019-02-27: 11:00:00 via INTRAVENOUS
  Filled 2019-02-27: qty 250

## 2019-02-27 MED ORDER — SODIUM CHLORIDE 0.9 % IV SOLN
200.0000 mg | Freq: Once | INTRAVENOUS | Status: AC
  Administered 2019-02-27: 200 mg via INTRAVENOUS
  Filled 2019-02-27: qty 10

## 2019-02-28 ENCOUNTER — Telehealth: Payer: Self-pay | Admitting: Hematology

## 2019-02-28 NOTE — Telephone Encounter (Signed)
Scheduled appts with pt per 10/14 los.  Spoke with pt and she is aware of her appt dates and time.

## 2019-03-05 ENCOUNTER — Telehealth: Payer: Self-pay | Admitting: Hematology

## 2019-03-05 ENCOUNTER — Other Ambulatory Visit: Payer: Medicare Other

## 2019-03-05 ENCOUNTER — Ambulatory Visit: Payer: Medicare Other

## 2019-03-05 NOTE — Telephone Encounter (Signed)
Returned patient's phone call regarding rescheduling 10/20 appointments, patient will continue with 10/28 and a treatment was added per los.

## 2019-03-05 NOTE — Telephone Encounter (Signed)
Returned patient's phone call regarding rescheduling an appointment, informed patient they will get a call back once I get further instructions from providers nurse.

## 2019-03-06 ENCOUNTER — Other Ambulatory Visit: Payer: Medicare Other

## 2019-03-13 ENCOUNTER — Other Ambulatory Visit: Payer: Self-pay

## 2019-03-13 ENCOUNTER — Inpatient Hospital Stay: Payer: Medicare Other

## 2019-03-13 VITALS — BP 121/56 | HR 72 | Temp 98.7°F | Resp 16

## 2019-03-13 DIAGNOSIS — K5520 Angiodysplasia of colon without hemorrhage: Secondary | ICD-10-CM

## 2019-03-13 DIAGNOSIS — D500 Iron deficiency anemia secondary to blood loss (chronic): Secondary | ICD-10-CM | POA: Diagnosis not present

## 2019-03-13 LAB — IRON AND TIBC
Iron: 20 ug/dL — ABNORMAL LOW (ref 41–142)
Saturation Ratios: 6 % — ABNORMAL LOW (ref 21–57)
TIBC: 330 ug/dL (ref 236–444)
UIBC: 310 ug/dL (ref 120–384)

## 2019-03-13 LAB — CBC WITH DIFFERENTIAL (CANCER CENTER ONLY)
Abs Immature Granulocytes: 0.09 10*3/uL — ABNORMAL HIGH (ref 0.00–0.07)
Basophils Absolute: 0.1 10*3/uL (ref 0.0–0.1)
Basophils Relative: 1 %
Eosinophils Absolute: 0.2 10*3/uL (ref 0.0–0.5)
Eosinophils Relative: 2 %
HCT: 29.7 % — ABNORMAL LOW (ref 36.0–46.0)
Hemoglobin: 8.3 g/dL — ABNORMAL LOW (ref 12.0–15.0)
Immature Granulocytes: 1 %
Lymphocytes Relative: 18 %
Lymphs Abs: 1.8 10*3/uL (ref 0.7–4.0)
MCH: 20.6 pg — ABNORMAL LOW (ref 26.0–34.0)
MCHC: 27.9 g/dL — ABNORMAL LOW (ref 30.0–36.0)
MCV: 73.9 fL — ABNORMAL LOW (ref 80.0–100.0)
Monocytes Absolute: 0.7 10*3/uL (ref 0.1–1.0)
Monocytes Relative: 7 %
Neutro Abs: 7.4 10*3/uL (ref 1.7–7.7)
Neutrophils Relative %: 71 %
Platelet Count: 481 10*3/uL — ABNORMAL HIGH (ref 150–400)
RBC: 4.02 MIL/uL (ref 3.87–5.11)
RDW: 20.6 % — ABNORMAL HIGH (ref 11.5–15.5)
WBC Count: 10.3 10*3/uL (ref 4.0–10.5)
nRBC: 1.2 % — ABNORMAL HIGH (ref 0.0–0.2)

## 2019-03-13 LAB — FERRITIN: Ferritin: 84 ng/mL (ref 11–307)

## 2019-03-13 MED ORDER — SODIUM CHLORIDE 0.9 % IV SOLN
Freq: Once | INTRAVENOUS | Status: AC
  Administered 2019-03-13: 14:00:00 via INTRAVENOUS
  Filled 2019-03-13: qty 250

## 2019-03-13 MED ORDER — SODIUM CHLORIDE 0.9 % IV SOLN
200.0000 mg | Freq: Once | INTRAVENOUS | Status: AC
  Administered 2019-03-13: 200 mg via INTRAVENOUS
  Filled 2019-03-13: qty 10

## 2019-03-13 NOTE — Progress Notes (Signed)
Pt declines to stay for 30 minute post infusion observation. Pt discharged to home A/O, VSS

## 2019-03-14 ENCOUNTER — Other Ambulatory Visit: Payer: Self-pay | Admitting: Hematology

## 2019-03-14 DIAGNOSIS — D500 Iron deficiency anemia secondary to blood loss (chronic): Secondary | ICD-10-CM

## 2019-03-19 ENCOUNTER — Telehealth: Payer: Self-pay | Admitting: Hematology

## 2019-03-19 NOTE — Telephone Encounter (Signed)
Returned patient's phone call regarding rescheduling missed 11/03 appointment, per patient's request appointment has moved to 11/04.

## 2019-03-20 ENCOUNTER — Telehealth: Payer: Self-pay

## 2019-03-20 ENCOUNTER — Ambulatory Visit: Payer: Medicare Other

## 2019-03-20 ENCOUNTER — Ambulatory Visit: Payer: Medicare Other | Admitting: Hematology

## 2019-03-20 ENCOUNTER — Other Ambulatory Visit: Payer: Medicare Other

## 2019-03-20 NOTE — Telephone Encounter (Signed)
Received phone call from [place] in IR that patient does not want to get a port right now.  They have put the order on hold.

## 2019-03-21 ENCOUNTER — Inpatient Hospital Stay: Payer: Medicare Other | Attending: Hematology

## 2019-03-21 ENCOUNTER — Other Ambulatory Visit: Payer: Self-pay

## 2019-03-21 VITALS — BP 152/57 | HR 63 | Temp 97.9°F | Resp 18

## 2019-03-21 DIAGNOSIS — D500 Iron deficiency anemia secondary to blood loss (chronic): Secondary | ICD-10-CM

## 2019-03-21 DIAGNOSIS — Q2733 Arteriovenous malformation of digestive system vessel: Secondary | ICD-10-CM | POA: Insufficient documentation

## 2019-03-21 DIAGNOSIS — K5520 Angiodysplasia of colon without hemorrhage: Secondary | ICD-10-CM

## 2019-03-21 MED ORDER — SODIUM CHLORIDE 0.9 % IV SOLN
200.0000 mg | Freq: Once | INTRAVENOUS | Status: DC
  Filled 2019-03-21: qty 10

## 2019-03-21 MED ORDER — SODIUM CHLORIDE 0.9 % IV SOLN
INTRAVENOUS | Status: DC
  Administered 2019-03-21: 10:00:00 via INTRAVENOUS
  Filled 2019-03-21: qty 250

## 2019-03-21 MED ORDER — SODIUM CHLORIDE 0.9 % IV SOLN
200.0000 mg | Freq: Once | INTRAVENOUS | Status: AC
  Administered 2019-03-21: 10:00:00 200 mg via INTRAVENOUS
  Filled 2019-03-21: qty 10

## 2019-03-21 NOTE — Patient Instructions (Signed)

## 2019-03-21 NOTE — Progress Notes (Signed)
Pt declined to stay for post 30 minute observation.  DC'd in stable condition.

## 2019-03-26 NOTE — Progress Notes (Deleted)
[place]   Telephone:(336) [phone] Fax:(336) [phone]   Clinic Follow up Note   Patient Care Team: Bethany Low, MD as PCP - General (Internal Medicine) Bethany Merle, MD as Consulting Physician (Hematology) Bethany Mould, MD as Consulting Physician (Vascular Surgery) Bethany Distance, MD as Referring Physician (Gastroenterology) 03/26/2019  CHIEF COMPLAINT: F/u anemia   CURRENT THERAPY: iv Feraheme as needed (if ferritin <100). Changed to IV Venofer on 01/02/19  INTERVAL HISTORY: Bethany [name] returns for f/u as scheduled. He was last seen 02/27/19. She received IV Venofer 10/14, 10/28, and 11/5. She was scheduled for a port but did not proceed.    REVIEW OF SYSTEMS:   Constitutional: Denies fevers, chills or abnormal weight loss Eyes: Denies blurriness of vision Ears, nose, mouth, throat, and face: Denies mucositis or sore throat Respiratory: Denies cough, dyspnea or wheezes Cardiovascular: Denies palpitation, chest discomfort or lower extremity swelling Gastrointestinal:  Denies nausea, heartburn or change in bowel habits Skin: Denies abnormal skin rashes Lymphatics: Denies new lymphadenopathy or easy bruising Neurological:Denies numbness, tingling or new weaknesses Behavioral/Psych: Mood is stable, no new changes  All other systems were reviewed with the patient and are negative.  MEDICAL HISTORY:  Past Medical History:  Diagnosis Date  . Acute renal failure ([place])   . Arthritis   . Diabetes mellitus without complication ([place])    type II   . Hypercalcemia   . Hypertension   . Renal disorder   . Single kidney   . Thrombosis     SURGICAL HISTORY: Past Surgical History:  Procedure Laterality Date  . ABDOMINAL HYSTERECTOMY    . blood clots removed from descending aorta     . CHOLECYSTECTOMY    . COLONOSCOPY    . COLONOSCOPY WITH PROPOFOL N/A 04/17/2017   Procedure: COLONOSCOPY WITH PROPOFOL;  Surgeon: Bethany Corner, MD;  Location: [facility]  END[facility];  Service: Endoscopy;  Laterality: N/A;  . ESOPHAGOGASTRODUODENOSCOPY (EGD) WITH PROPOFOL N/A 04/17/2017   Procedure: ESOPHAGOGASTRODUODENOSCOPY (EGD) WITH PROPOFOL;  Surgeon: Bethany Corner, MD;  Location: [facility];  Service: Endoscopy;  Laterality: N/A;  . NEPHRECTOMY RECIPIENT      I have reviewed the social history and family history with the patient and they are unchanged from previous note.  ALLERGIES:  is allergic to contrast media [iodinated diagnostic agents].  MEDICATIONS:  Current Outpatient Medications  Medication Sig Dispense Refill  . amLODipine (NORVASC) 5 MG tablet Take 1 tablet (5 mg total) by mouth daily. 30 tablet 0  . Dulaglutide (TRULICITY [place]) Inject into the skin.    Marland Kitchen ergocalciferol (VITAMIN D2) 1.25 MG (50000 UT) capsule Take 50,000 Units by mouth once a week.    . gabapentin (NEURONTIN) 600 MG tablet Take 600 mg by mouth 4 (four) times daily.     . hydrALAZINE (APRESOLINE) 25 MG tablet Take 25 mg by mouth 3 (three) times daily.    . Insulin Glargine (TOUJEO SOLOSTAR) 300 UNIT/ML SOPN Inject 40 Units into the skin at bedtime.     . insulin lispro (HUMALOG KWIKPEN) 100 UNIT/ML KiwkPen Inject 22-24 Units into the skin daily as needed. Inject 24 units with breakfast, 22 units with lunch, and 24 units with dinner    . Ipratropium-Albuterol (COMBIVENT) 20-100 MCG/ACT AERS respimat Inhale 1 puff into the lungs every 6 (six) hours as needed for wheezing or shortness of breath. 1 Inhaler 0  . levothyroxine (SYNTHROID, LEVOTHROID) 25 MCG tablet Take 75 mcg by mouth daily before breakfast.     . magnesium 30  MG tablet Take 30 mg by mouth 2 (two) times daily.    . metoprolol (LOPRESSOR) 50 MG tablet Take 50 mg by mouth 2 (two) times daily.     Marland Kitchen oxyCODONE-acetaminophen (PERCOCET) 10-325 MG tablet Take 1 tablet by mouth every 6 (six) hours as needed for pain.    . pantoprazole (PROTONIX) 40 MG tablet Take 40 mg by mouth daily.    Marland Kitchen PRALUENT 75 MG/ML SOPN Inject  75 mg as directed every 14 (fourteen) days.  4  . pregabalin (LYRICA) 25 MG capsule Take 25 mg by mouth daily.     No current facility-administered medications for this visit.     PHYSICAL EXAMINATION: ECOG PERFORMANCE STATUS: {CHL ONC ECOG PS:[phone]}  There were no vitals filed for this visit. There were no vitals filed for this visit.  GENERAL:alert, no distress and comfortable SKIN: skin color, texture, turgor are normal, no rashes or significant lesions EYES: normal, Conjunctiva are pink and non-injected, sclera clear OROPHARYNX:no exudate, no erythema and lips, buccal mucosa, and tongue normal  NECK: supple, thyroid normal size, non-tender, without nodularity LYMPH:  no palpable lymphadenopathy in the cervical, axillary or inguinal LUNGS: clear to auscultation and percussion with normal breathing effort HEART: regular rate & rhythm and no murmurs and no lower extremity edema ABDOMEN:abdomen soft, non-tender and normal bowel sounds Musculoskeletal:no cyanosis of digits and no clubbing  NEURO: alert & oriented x 3 with fluent speech, no focal motor/sensory deficits  LABORATORY DATA:  I have reviewed the data as listed CBC Latest Ref Rng & Units 03/13/2019 02/27/2019 01/30/2019  WBC 4.0 - 10.5 K/uL 10.3 8.5 12.0(H)  Hemoglobin 12.0 - 15.0 g/dL 8.3(L) 8.5(L) 11.1(L)  Hematocrit 36.0 - 46.0 % 29.7(L) 29.9(L) 37.5  Platelets 150 - 400 K/uL 481(H) 341 301     CMP Latest Ref Rng & Units 01/30/2019 08/03/2018 11/07/2017  Glucose 70 - 99 mg/dL 85 161(H) 130(H)  BUN 8 - 23 mg/dL 25(H) 16 21  Creatinine 0.44 - 1.00 mg/dL 1.19(H) 1.27(H) 1.18(H)  Sodium 135 - 145 mmol/L 141 137 139  Potassium 3.5 - 5.1 mmol/L 4.3 4.0 4.9  Chloride 98 - 111 mmol/L 106 103 104  CO2 22 - 32 mmol/L 26 23 24   Calcium 8.9 - 10.3 mg/dL 10.5(H) 9.9 10.6(H)  Total Protein 6.5 - 8.1 g/dL 7.2 7.0 7.1  Total Bilirubin 0.3 - 1.2 mg/dL 0.3 0.2(L) 0.2(L)  Alkaline Phos 38 - 126 U/L 185(H) 166(H) 121  AST 15 -  41 U/L 45(H) 23 27  ALT 0 - 44 U/L 41 36 28      RADIOGRAPHIC STUDIES: I have personally reviewed the radiological images as listed and agreed with the findings in the report. No results found.   ASSESSMENT & PLAN:  No problem-specific Assessment & Plan notes found for this encounter.   No orders of the defined types were placed in this encounter.  All questions were answered. The patient knows to call the clinic with any problems, questions or concerns. No barriers to learning was detected. I spent {CHL ONC TIME VISIT - [id number] counseling the patient face to face. The total time spent in the appointment was {CHL ONC TIME VISIT - [id number] and more than 50% was on counseling and review of test results     Bethany Feeling, Bethany [name] 03/26/19

## 2019-03-27 ENCOUNTER — Other Ambulatory Visit: Payer: Self-pay

## 2019-03-27 ENCOUNTER — Ambulatory Visit: Payer: Medicare Other | Admitting: Nurse Practitioner

## 2019-03-27 ENCOUNTER — Other Ambulatory Visit: Payer: Medicare Other

## 2019-03-27 ENCOUNTER — Emergency Department (HOSPITAL_COMMUNITY): Payer: Medicare Other

## 2019-03-27 ENCOUNTER — Ambulatory Visit: Payer: Medicare Other

## 2019-03-27 ENCOUNTER — Inpatient Hospital Stay (HOSPITAL_COMMUNITY): Payer: Medicare Other

## 2019-03-27 ENCOUNTER — Telehealth: Payer: Self-pay | Admitting: Hematology

## 2019-03-27 ENCOUNTER — Encounter (HOSPITAL_COMMUNITY): Payer: Self-pay | Admitting: Emergency Medicine

## 2019-03-27 ENCOUNTER — Inpatient Hospital Stay (HOSPITAL_COMMUNITY)
Admission: EM | Admit: 2019-03-27 | Discharge: 2019-03-30 | DRG: 291 | Disposition: A | Discharge status: Home / Self Care | Payer: Medicare Other | Attending: Internal Medicine | Admitting: Internal Medicine

## 2019-03-27 DIAGNOSIS — J9601 Acute respiratory failure with hypoxia: Secondary | ICD-10-CM | POA: Diagnosis present

## 2019-03-27 DIAGNOSIS — D6859 Other primary thrombophilia: Secondary | ICD-10-CM | POA: Diagnosis present

## 2019-03-27 DIAGNOSIS — I5031 Acute diastolic (congestive) heart failure: Secondary | ICD-10-CM | POA: Diagnosis present

## 2019-03-27 DIAGNOSIS — N1831 Chronic kidney disease, stage 3a: Secondary | ICD-10-CM | POA: Diagnosis present

## 2019-03-27 DIAGNOSIS — R509 Fever, unspecified: Secondary | ICD-10-CM

## 2019-03-27 DIAGNOSIS — E1149 Type 2 diabetes mellitus with other diabetic neurological complication: Secondary | ICD-10-CM | POA: Diagnosis present

## 2019-03-27 DIAGNOSIS — J449 Chronic obstructive pulmonary disease, unspecified: Secondary | ICD-10-CM | POA: Diagnosis present

## 2019-03-27 DIAGNOSIS — N1832 Chronic kidney disease, stage 3b: Secondary | ICD-10-CM | POA: Diagnosis present

## 2019-03-27 DIAGNOSIS — I5033 Acute on chronic diastolic (congestive) heart failure: Secondary | ICD-10-CM | POA: Diagnosis not present

## 2019-03-27 DIAGNOSIS — M109 Gout, unspecified: Secondary | ICD-10-CM | POA: Diagnosis present

## 2019-03-27 DIAGNOSIS — Z800 Family history of malignant neoplasm of digestive organs: Secondary | ICD-10-CM | POA: Diagnosis not present

## 2019-03-27 DIAGNOSIS — F17210 Nicotine dependence, cigarettes, uncomplicated: Secondary | ICD-10-CM | POA: Diagnosis present

## 2019-03-27 DIAGNOSIS — Z8249 Family history of ischemic heart disease and other diseases of the circulatory system: Secondary | ICD-10-CM | POA: Diagnosis not present

## 2019-03-27 DIAGNOSIS — R0602 Shortness of breath: Secondary | ICD-10-CM | POA: Diagnosis present

## 2019-03-27 DIAGNOSIS — Z20822 Contact with and (suspected) exposure to covid-19: Secondary | ICD-10-CM

## 2019-03-27 DIAGNOSIS — I5032 Chronic diastolic (congestive) heart failure: Secondary | ICD-10-CM

## 2019-03-27 DIAGNOSIS — J9600 Acute respiratory failure, unspecified whether with hypoxia or hypercapnia: Secondary | ICD-10-CM | POA: Diagnosis present

## 2019-03-27 DIAGNOSIS — Z794 Long term (current) use of insulin: Secondary | ICD-10-CM | POA: Diagnosis not present

## 2019-03-27 DIAGNOSIS — K922 Gastrointestinal hemorrhage, unspecified: Secondary | ICD-10-CM | POA: Diagnosis not present

## 2019-03-27 DIAGNOSIS — I130 Hypertensive heart and chronic kidney disease with heart failure and stage 1 through stage 4 chronic kidney disease, or unspecified chronic kidney disease: Principal | ICD-10-CM | POA: Diagnosis present

## 2019-03-27 DIAGNOSIS — N183 Chronic kidney disease, stage 3 unspecified: Secondary | ICD-10-CM | POA: Diagnosis present

## 2019-03-27 DIAGNOSIS — J90 Pleural effusion, not elsewhere classified: Secondary | ICD-10-CM | POA: Diagnosis present

## 2019-03-27 DIAGNOSIS — M1990 Unspecified osteoarthritis, unspecified site: Secondary | ICD-10-CM | POA: Diagnosis present

## 2019-03-27 DIAGNOSIS — N179 Acute kidney failure, unspecified: Secondary | ICD-10-CM | POA: Diagnosis present

## 2019-03-27 DIAGNOSIS — Z833 Family history of diabetes mellitus: Secondary | ICD-10-CM | POA: Diagnosis not present

## 2019-03-27 DIAGNOSIS — D500 Iron deficiency anemia secondary to blood loss (chronic): Secondary | ICD-10-CM | POA: Diagnosis not present

## 2019-03-27 DIAGNOSIS — Z905 Acquired absence of kidney: Secondary | ICD-10-CM

## 2019-03-27 DIAGNOSIS — G8929 Other chronic pain: Secondary | ICD-10-CM | POA: Diagnosis present

## 2019-03-27 DIAGNOSIS — E782 Mixed hyperlipidemia: Secondary | ICD-10-CM | POA: Diagnosis present

## 2019-03-27 DIAGNOSIS — Z803 Family history of malignant neoplasm of breast: Secondary | ICD-10-CM

## 2019-03-27 DIAGNOSIS — Z79890 Hormone replacement therapy (postmenopausal): Secondary | ICD-10-CM

## 2019-03-27 DIAGNOSIS — E1142 Type 2 diabetes mellitus with diabetic polyneuropathy: Secondary | ICD-10-CM | POA: Diagnosis present

## 2019-03-27 DIAGNOSIS — E039 Hypothyroidism, unspecified: Secondary | ICD-10-CM | POA: Diagnosis present

## 2019-03-27 DIAGNOSIS — Z66 Do not resuscitate: Secondary | ICD-10-CM | POA: Diagnosis present

## 2019-03-27 DIAGNOSIS — K219 Gastro-esophageal reflux disease without esophagitis: Secondary | ICD-10-CM | POA: Diagnosis present

## 2019-03-27 DIAGNOSIS — Z91041 Radiographic dye allergy status: Secondary | ICD-10-CM

## 2019-03-27 DIAGNOSIS — Z86718 Personal history of other venous thrombosis and embolism: Secondary | ICD-10-CM

## 2019-03-27 DIAGNOSIS — Z20828 Contact with and (suspected) exposure to other viral communicable diseases: Secondary | ICD-10-CM | POA: Diagnosis present

## 2019-03-27 DIAGNOSIS — I10 Essential (primary) hypertension: Secondary | ICD-10-CM | POA: Diagnosis not present

## 2019-03-27 DIAGNOSIS — I509 Heart failure, unspecified: Secondary | ICD-10-CM | POA: Diagnosis not present

## 2019-03-27 LAB — SARS CORONAVIRUS 2 (TAT 6-24 HRS): SARS Coronavirus 2: NEGATIVE

## 2019-03-27 LAB — COMPREHENSIVE METABOLIC PANEL
ALT: 11 U/L (ref 0–44)
AST: 15 U/L (ref 15–41)
Albumin: 3.1 g/dL — ABNORMAL LOW (ref 3.5–5.0)
Alkaline Phosphatase: 150 U/L — ABNORMAL HIGH (ref 38–126)
Anion gap: 11 (ref 5–15)
BUN: 16 mg/dL (ref 8–23)
CO2: 23 mmol/L (ref 22–32)
Calcium: 9.7 mg/dL (ref 8.9–10.3)
Chloride: 102 mmol/L (ref 98–111)
Creatinine, Ser: 1.4 mg/dL — ABNORMAL HIGH (ref 0.44–1.00)
GFR calc Af Amer: 44 mL/min — ABNORMAL LOW (ref >60)
GFR calc non Af Amer: 38 mL/min — ABNORMAL LOW (ref >60)
Glucose, Bld: 107 mg/dL — ABNORMAL HIGH (ref 70–99)
Potassium: 4.7 mmol/L (ref 3.5–5.1)
Sodium: 136 mmol/L (ref 135–145)
Total Bilirubin: 0.6 mg/dL (ref 0.3–1.2)
Total Protein: 6.5 g/dL (ref 6.5–8.1)

## 2019-03-27 LAB — CBC
HCT: 32.2 % — ABNORMAL LOW (ref 36.0–46.0)
Hemoglobin: 9 g/dL — ABNORMAL LOW (ref 12.0–15.0)
MCH: 21.1 pg — ABNORMAL LOW (ref 26.0–34.0)
MCHC: 28 g/dL — ABNORMAL LOW (ref 30.0–36.0)
MCV: 75.4 fL — ABNORMAL LOW (ref 80.0–100.0)
Platelets: 306 10*3/uL (ref 150–400)
RBC: 4.27 MIL/uL (ref 3.87–5.11)
RDW: 23.8 % — ABNORMAL HIGH (ref 11.5–15.5)
WBC: 11.3 10*3/uL — ABNORMAL HIGH (ref 4.0–10.5)
nRBC: 0.3 % — ABNORMAL HIGH (ref 0.0–0.2)

## 2019-03-27 LAB — LACTIC ACID, PLASMA
Lactic Acid, Venous: 1 mmol/L (ref 0.5–1.9)
Lactic Acid, Venous: 0.9 mmol/L (ref 0.5–1.9)

## 2019-03-27 LAB — CBC WITH DIFFERENTIAL/PLATELET
Abs Immature Granulocytes: 0 10*3/uL (ref 0.00–0.07)
Basophils Absolute: 0 10*3/uL (ref 0.0–0.1)
Basophils Relative: 0 %
Eosinophils Absolute: 0.3 10*3/uL (ref 0.0–0.5)
Eosinophils Relative: 3 %
HCT: 33 % — ABNORMAL LOW (ref 36.0–46.0)
Hemoglobin: 9.1 g/dL — ABNORMAL LOW (ref 12.0–15.0)
Lymphocytes Relative: 17 %
Lymphs Abs: 1.9 10*3/uL (ref 0.7–4.0)
MCH: 21.3 pg — ABNORMAL LOW (ref 26.0–34.0)
MCHC: 27.6 g/dL — ABNORMAL LOW (ref 30.0–36.0)
MCV: 77.1 fL — ABNORMAL LOW (ref 80.0–100.0)
Monocytes Absolute: 0.4 10*3/uL (ref 0.1–1.0)
Monocytes Relative: 4 %
Neutro Abs: 8.3 10*3/uL — ABNORMAL HIGH (ref 1.7–7.7)
Neutrophils Relative %: 76 %
Platelets: 346 10*3/uL (ref 150–400)
RBC: 4.28 MIL/uL (ref 3.87–5.11)
RDW: 24.3 % — ABNORMAL HIGH (ref 11.5–15.5)
WBC: 10.9 10*3/uL — ABNORMAL HIGH (ref 4.0–10.5)
nRBC: 0 /100 WBC
nRBC: 0.3 % — ABNORMAL HIGH (ref 0.0–0.2)

## 2019-03-27 LAB — TRIGLYCERIDES: Triglycerides: 132 mg/dL (ref <150)

## 2019-03-27 LAB — GLUCOSE, CAPILLARY: Glucose-Capillary: 154 mg/dL — ABNORMAL HIGH (ref 70–99)

## 2019-03-27 LAB — LACTATE DEHYDROGENASE: LDH: 120 U/L (ref 98–192)

## 2019-03-27 LAB — HEMOGLOBIN A1C
Hgb A1c MFr Bld: 5.4 % (ref 4.8–5.6)
Mean Plasma Glucose: 108.28 mg/dL

## 2019-03-27 LAB — PROCALCITONIN: Procalcitonin: <0.1 ng/mL

## 2019-03-27 LAB — HIV ANTIBODY (ROUTINE TESTING W REFLEX): HIV Screen 4th Generation wRfx: NONREACTIVE

## 2019-03-27 LAB — D-DIMER, QUANTITATIVE: D-Dimer, Quant: 0.98 ug/mL-FEU — ABNORMAL HIGH (ref 0.00–0.50)

## 2019-03-27 LAB — BRAIN NATRIURETIC PEPTIDE: B Natriuretic Peptide: 232.2 pg/mL — ABNORMAL HIGH (ref 0.0–100.0)

## 2019-03-27 LAB — FERRITIN: Ferritin: 74 ng/mL (ref 11–307)

## 2019-03-27 LAB — FIBRINOGEN: Fibrinogen: 600 mg/dL — ABNORMAL HIGH (ref 210–475)

## 2019-03-27 LAB — C-REACTIVE PROTEIN: CRP: 4.6 mg/dL — ABNORMAL HIGH (ref <1.0)

## 2019-03-27 LAB — TSH: TSH: 1.126 u[IU]/mL (ref 0.350–4.500)

## 2019-03-27 MED ORDER — IPRATROPIUM-ALBUTEROL 20-100 MCG/ACT IN AERS
1.0000 | INHALATION_SPRAY | Freq: Four times a day (QID) | RESPIRATORY_TRACT | Status: DC | PRN
  Filled 2019-03-27: qty 4

## 2019-03-27 MED ORDER — INSULIN GLARGINE 100 UNIT/ML ~~LOC~~ SOLN
40.0000 [IU] | Freq: Every day | SUBCUTANEOUS | Status: DC
  Administered 2019-03-27 – 2019-03-29 (×3): 40 [IU] via SUBCUTANEOUS
  Filled 2019-03-27 (×4): qty 0.4

## 2019-03-27 MED ORDER — HYDRALAZINE HCL 25 MG PO TABS
25.0000 mg | ORAL_TABLET | Freq: Three times a day (TID) | ORAL | Status: DC
  Administered 2019-03-27 – 2019-03-30 (×8): 25 mg via ORAL
  Filled 2019-03-27 (×8): qty 1

## 2019-03-27 MED ORDER — SODIUM CHLORIDE 0.9 % IV SOLN
1.0000 g | INTRAVENOUS | Status: DC
  Administered 2019-03-27: 1 g via INTRAVENOUS
  Filled 2019-03-27 (×2): qty 10

## 2019-03-27 MED ORDER — ONDANSETRON HCL 4 MG/2ML IJ SOLN: 4.0000 mg | Freq: Four times a day (QID) | INTRAMUSCULAR | Status: DC | PRN

## 2019-03-27 MED ORDER — GUAIFENESIN ER 600 MG PO TB12
600.0000 mg | ORAL_TABLET | Freq: Two times a day (BID) | ORAL | Status: DC
  Administered 2019-03-27 – 2019-03-28 (×2): 600 mg via ORAL
  Filled 2019-03-27 (×2): qty 1

## 2019-03-27 MED ORDER — OXYCODONE-ACETAMINOPHEN 5-325 MG PO TABS
1.0000 | ORAL_TABLET | Freq: Four times a day (QID) | ORAL | Status: DC | PRN
  Administered 2019-03-27 – 2019-03-30 (×5): 1 via ORAL
  Filled 2019-03-27 (×6): qty 1

## 2019-03-27 MED ORDER — PREGABALIN 25 MG PO CAPS
25.0000 mg | ORAL_CAPSULE | Freq: Every day | ORAL | Status: DC
  Administered 2019-03-27 – 2019-03-30 (×4): 25 mg via ORAL
  Filled 2019-03-27 (×4): qty 1

## 2019-03-27 MED ORDER — SENNOSIDES-DOCUSATE SODIUM 8.6-50 MG PO TABS: 1.0000 | ORAL_TABLET | Freq: Every evening | ORAL | Status: DC | PRN

## 2019-03-27 MED ORDER — ONDANSETRON HCL 4 MG PO TABS: 4.0000 mg | ORAL_TABLET | Freq: Four times a day (QID) | ORAL | Status: DC | PRN

## 2019-03-27 MED ORDER — ACETAMINOPHEN 650 MG RE SUPP: 650.0000 mg | Freq: Four times a day (QID) | RECTAL | Status: DC | PRN

## 2019-03-27 MED ORDER — HEPARIN SODIUM (PORCINE) 5000 UNIT/ML IJ SOLN
5000.0000 [IU] | Freq: Three times a day (TID) | INTRAMUSCULAR | Status: DC
  Administered 2019-03-27 – 2019-03-29 (×5): 5000 [IU] via SUBCUTANEOUS
  Filled 2019-03-27 (×5): qty 1

## 2019-03-27 MED ORDER — AMLODIPINE BESYLATE 5 MG PO TABS
5.0000 mg | ORAL_TABLET | Freq: Every day | ORAL | Status: DC
  Administered 2019-03-27 – 2019-03-30 (×4): 5 mg via ORAL
  Filled 2019-03-27 (×4): qty 1

## 2019-03-27 MED ORDER — ENSURE ENLIVE PO LIQD
237.0000 mL | Freq: Two times a day (BID) | ORAL | Status: DC
  Administered 2019-03-28 – 2019-03-30 (×6): 237 mL via ORAL

## 2019-03-27 MED ORDER — METOPROLOL TARTRATE 50 MG PO TABS
50.0000 mg | ORAL_TABLET | Freq: Two times a day (BID) | ORAL | Status: DC
  Administered 2019-03-27 – 2019-03-30 (×6): 50 mg via ORAL
  Filled 2019-03-27 (×6): qty 1

## 2019-03-27 MED ORDER — ATORVASTATIN CALCIUM 10 MG PO TABS
20.0000 mg | ORAL_TABLET | Freq: Every day | ORAL | Status: DC
  Administered 2019-03-27 – 2019-03-30 (×4): 20 mg via ORAL
  Filled 2019-03-27 (×4): qty 2

## 2019-03-27 MED ORDER — GABAPENTIN 600 MG PO TABS
600.0000 mg | ORAL_TABLET | Freq: Four times a day (QID) | ORAL | Status: DC
  Administered 2019-03-27 – 2019-03-30 (×11): 600 mg via ORAL
  Filled 2019-03-27 (×12): qty 1

## 2019-03-27 MED ORDER — LEVOTHYROXINE SODIUM 75 MCG PO TABS
75.0000 ug | ORAL_TABLET | Freq: Every day | ORAL | Status: DC
  Administered 2019-03-28 – 2019-03-30 (×3): 75 ug via ORAL
  Filled 2019-03-27 (×3): qty 1

## 2019-03-27 MED ORDER — OXYCODONE-ACETAMINOPHEN 10-325 MG PO TABS: 1.0000 | ORAL_TABLET | Freq: Four times a day (QID) | ORAL | Status: DC | PRN

## 2019-03-27 MED ORDER — TECHNETIUM TO 99M ALBUMIN AGGREGATED
1.5000 | Freq: Once | INTRAVENOUS | Status: AC | PRN
  Administered 2019-03-27: 1.5 via INTRAVENOUS

## 2019-03-27 MED ORDER — SODIUM CHLORIDE 0.9 % IV SOLN
500.0000 mg | INTRAVENOUS | Status: DC
  Filled 2019-03-27 (×2): qty 500

## 2019-03-27 MED ORDER — OXYCODONE HCL 5 MG PO TABS
5.0000 mg | ORAL_TABLET | Freq: Four times a day (QID) | ORAL | Status: DC | PRN
  Administered 2019-03-28 – 2019-03-29 (×2): 5 mg via ORAL
  Filled 2019-03-27 (×2): qty 1

## 2019-03-27 MED ORDER — PANTOPRAZOLE SODIUM 40 MG PO TBEC
40.0000 mg | DELAYED_RELEASE_TABLET | Freq: Every day | ORAL | Status: DC
  Administered 2019-03-28 – 2019-03-30 (×3): 40 mg via ORAL
  Filled 2019-03-27 (×3): qty 1

## 2019-03-27 MED ORDER — ACETAMINOPHEN 325 MG PO TABS: 650.0000 mg | ORAL_TABLET | Freq: Four times a day (QID) | ORAL | Status: DC | PRN

## 2019-03-27 NOTE — ED Provider Notes (Signed)
Patient placed in Quick Look pathway, seen and evaluated   Chief Complaint: SOB, Fever, Bodyaches, fatigue  HPI:  Hx COPD, HTN, DM. Sunday 03/24/2019 began feeling tired, generalized aches, and low grade fever. Describes shortness of breath worse with exertion. Intermittent minimally productive cough. Symptoms worsening x 4 days. Febrile with EMS to 101.18F. At triage hypoxic at 87% room air, placed on 2L [place].    ROS: Denies vision changes, neck pain/stiffness, chest pain, abdominal pain, n/v/d, extremity color change/swelling or pain.   Physical Exam:   Gen: No distress  Neuro: Awake and Alert  Skin: Warm    Focused Exam: Coarse breath sounds bilaterally, HRRR, Abdomen soft nontender without peritoneal signs. NVI x 4 without signs of dvt. No meningismus or neuro deficit.  Plan: High suspicion for viral covid infection. Order set used. No hx of chest pain or pleurisy.  Initiation of care has begun. The patient has been counseled on the process, plan, and necessity for staying for the completion/evaluation, and the remainder of the medical screening examination   Deliah Boston, PA-C 03/27/19 [place], [place], MD 03/27/19 [phone]

## 2019-03-27 NOTE — ED Notes (Signed)
Unable to start antibiotics, pt requesting a new IV

## 2019-03-27 NOTE — ED Triage Notes (Signed)
Pt arrives via EMS from home with fever to 101.6, bodyaches, generalized mailase. Denies pain, cough, n/v. Alert, oriented x4, ambulatory on scene. 120/58, hr 80, 20rr, 98% 3L. 12 lead NSR. 1000mg  tylenol PTA.

## 2019-03-27 NOTE — ED Notes (Signed)
IV team at bedside 

## 2019-03-27 NOTE — ED Notes (Signed)
Report given to Arthor Captain RN

## 2019-03-27 NOTE — ED Provider Notes (Signed)
[facility] EMERGENCY DEPARTMENT Provider Note   CSN: [id number] Arrival date & time: 03/27/19  [id number]     History   Chief Complaint Chief Complaint  Patient presents with  . Fever    HPI Bethany [name] is a 68 y.o. female with a past medical history of diabetes, hypertension COPD presenting to the ED with a chief complaint of shortness of breath.  States that about 4 days ago, she began feeling short of breath and "feeling like I was having an asthma attack."  Reports chills, generalized fatigue and body aches.  States that her shortness of breath is worse with movement.  Reports intermittent dry cough.  No sick contacts with similar symptoms, no known exposures to COVID-19.  She denies any chest pain, abdominal pain, nausea or vomiting.  She has not tried any medications to help with her symptoms.  She does not wear supplemental oxygen at home.     HPI  Past Medical History:  Diagnosis Date  . Acute renal failure ([place])   . Arthritis   . Diabetes mellitus without complication ([place])    type II   . Hypercalcemia   . Hypertension   . Renal disorder   . Single kidney   . Thrombosis     Patient Active Problem List   Diagnosis Date Noted  . Acute respiratory failure with hypoxia ([place]) 03/27/2019  . Breast mass, right 06/08/2017  . Liver mass 06/08/2017  . Chronic kidney disease (CKD), stage III (moderate) 03/13/2017  . Chronic headaches 03/13/2017  . Depression 03/13/2017  . Diabetes mellitus type 2 with neurological manifestations ([place]) 03/13/2017  . History of thrombosis 03/13/2017  . Hyperlipemia, mixed 03/13/2017  . Community acquired pneumonia 03/13/2017  . COPD exacerbation ([place]) 03/13/2017  . Pneumonia 03/13/2017  . Iron deficiency anemia secondary to blood loss (chronic) 03/07/2017  . GI AVM (gastrointestinal arteriovenous vascular malformation) 03/07/2017  . History of colonic polyps 10/05/2016    Past Surgical History:  Procedure Laterality  Date  . ABDOMINAL HYSTERECTOMY    . blood clots removed from descending aorta     . CHOLECYSTECTOMY    . COLONOSCOPY    . COLONOSCOPY WITH PROPOFOL N/A 04/17/2017   Procedure: COLONOSCOPY WITH PROPOFOL;  Surgeon: Wilford Corner, MD;  Location: [facility] END[facility];  Service: Endoscopy;  Laterality: N/A;  . ESOPHAGOGASTRODUODENOSCOPY (EGD) WITH PROPOFOL N/A 04/17/2017   Procedure: ESOPHAGOGASTRODUODENOSCOPY (EGD) WITH PROPOFOL;  Surgeon: Wilford Corner, MD;  Location: [facility] END[facility];  Service: Endoscopy;  Laterality: N/A;  . NEPHRECTOMY RECIPIENT       OB History   No obstetric history on file.      Home Medications    Prior to Admission medications   Medication Sig Start Date End Date Taking? Authorizing Provider  amLODipine (NORVASC) 5 MG tablet Take 1 tablet (5 mg total) by mouth daily. 03/18/17  Yes Hongalgi, Lenis Dickinson, MD  atorvastatin (LIPITOR) 20 MG tablet Take 20 mg by mouth daily.   Yes [provider]  Dulaglutide (TRULICITY) 1.5 0000000 SOPN Inject 1.5 mg into the skin once a week. Mondays   Yes [provider]  ergocalciferol (VITAMIN D2) 1.25 MG (50000 UT) capsule Take 50,000 Units by mouth once a week. Mondays   Yes [provider]  gabapentin (NEURONTIN) 600 MG tablet Take 600 mg by mouth 4 (four) times daily.    Yes [provider]  hydrALAZINE (APRESOLINE) 25 MG tablet Take 25 mg by mouth 3 (three) times daily.   Yes  [provider]  Insulin Glargine (TOUJEO SOLOSTAR) 300 UNIT/ML SOPN Inject 40 Units into the skin at bedtime.    Yes [provider]  insulin lispro (HUMALOG KWIKPEN) 100 UNIT/ML KiwkPen Inject 24 Units into the skin daily with supper.  09/19/16  Yes [provider]  Ipratropium-Albuterol (COMBIVENT) 20-100 MCG/ACT AERS respimat Inhale 1 puff into the lungs every 6 (six) hours as needed for wheezing or shortness of breath. 03/17/17  Yes Hongalgi, Lenis Dickinson, MD  levothyroxine (SYNTHROID) 75 MCG tablet Take  75 mcg by mouth daily. 02/28/19  Yes [provider]  magnesium 30 MG tablet Take 30 mg by mouth daily.    Yes [provider]  metoprolol (LOPRESSOR) 50 MG tablet Take 50 mg by mouth 2 (two) times daily.  03/06/14  Yes [provider]  oxyCODONE-acetaminophen (PERCOCET) 10-325 MG tablet Take 1 tablet by mouth every 6 (six) hours as needed for pain.   Yes [provider]  pantoprazole (PROTONIX) 40 MG tablet Take 40 mg by mouth daily.   Yes [provider]  pregabalin (LYRICA) 25 MG capsule Take 25 mg by mouth daily. 01/24/19  Yes [provider]  XTAMPZA ER 27 MG C12A Take 1 capsule by mouth 2 (two) times daily. 02/27/19  Yes [provider]    Family History Family History  Problem Relation Age of Onset  . Anemia Mother   . Diabetes Father   . Hypertension Brother   . Breast cancer Maternal Aunt        23s  . Breast cancer Maternal Aunt 75       colon cancer    Social History Social History   Tobacco Use  . Smoking status: Current Every Day Smoker    Packs/day: 0.25    Years: 40.00    Pack years: 10.00    Types: Cigarettes  . Smokeless tobacco: Never Used  Substance Use Topics  . Alcohol use: No  . Drug use: No     Allergies   Contrast media [iodinated diagnostic agents]   Review of Systems Review of Systems  Constitutional: Positive for chills. Negative for appetite change and fever.  HENT: Negative for ear pain, rhinorrhea, sneezing and sore throat.   Eyes: Negative for photophobia and visual disturbance.  Respiratory: Positive for cough and shortness of breath. Negative for chest tightness and wheezing.   Cardiovascular: Negative for chest pain and palpitations.  Gastrointestinal: Negative for abdominal pain, blood in stool, constipation, diarrhea, nausea and vomiting.  Genitourinary: Negative for dysuria, hematuria and urgency.  Musculoskeletal: Positive for myalgias.  Skin: Negative for rash.   Neurological: Negative for dizziness, weakness and light-headedness.     Physical Exam Updated Vital Signs BP (!) 137/56   Pulse 80   Temp 99.3 F (37.4 C) (Oral)   Resp 15   Ht 5\' 5"  (1.651 m)   Wt 84.4 kg   SpO2 100%   BMI 30.95 kg/m   Physical Exam Vitals signs and nursing note reviewed.  Constitutional:      General: She is not in acute distress.    Appearance: She is well-developed.  HENT:     Head: Normocephalic and atraumatic.     Nose: Nose normal.  Eyes:     General: No scleral icterus.       Left eye: No discharge.     Conjunctiva/sclera: Conjunctivae normal.  Neck:     Musculoskeletal: Normal range of motion and neck supple.  Cardiovascular:  Rate and Rhythm: Normal rate and regular rhythm.     Heart sounds: Normal heart sounds. No murmur. No friction rub. No gallop.   Pulmonary:     Effort: Pulmonary effort is normal. No respiratory distress.     Breath sounds: Normal breath sounds.  Abdominal:     General: Bowel sounds are normal. There is no distension.     Palpations: Abdomen is soft.     Tenderness: There is no abdominal tenderness. There is no guarding.  Musculoskeletal: Normal range of motion.  Skin:    General: Skin is warm and dry.     Findings: No rash.  Neurological:     Mental Status: She is alert.     Motor: No abnormal muscle tone.     Coordination: Coordination normal.      ED Treatments / Results  Labs (all labs ordered are listed, but only abnormal results are displayed) Labs Reviewed  CBC WITH DIFFERENTIAL/PLATELET - Abnormal; Notable for the following components:      Result Value   WBC 10.9 (*)    Hemoglobin 9.1 (*)    HCT 33.0 (*)    MCV 77.1 (*)    MCH 21.3 (*)    MCHC 27.6 (*)    RDW 24.3 (*)    nRBC 0.3 (*)    Neutro Abs 8.3 (*)    All other components within normal limits  COMPREHENSIVE METABOLIC PANEL - Abnormal; Notable for the following components:   Glucose, Bld 107 (*)    Creatinine, Ser 1.40 (*)     Albumin 3.1 (*)    Alkaline Phosphatase 150 (*)    GFR calc non Af Amer 38 (*)    GFR calc Af Amer 44 (*)    All other components within normal limits  D-DIMER, QUANTITATIVE (NOT AT [facility]) - Abnormal; Notable for the following components:   D-Dimer, Quant 0.98 (*)    All other components within normal limits  FIBRINOGEN - Abnormal; Notable for the following components:   Fibrinogen 600 (*)    All other components within normal limits  C-REACTIVE PROTEIN - Abnormal; Notable for the following components:   CRP 4.6 (*)    All other components within normal limits  CULTURE, BLOOD (ROUTINE X 2)  CULTURE, BLOOD (ROUTINE X 2)  SARS CORONAVIRUS 2 (TAT 6-24 HRS)  LACTIC ACID, PLASMA  PROCALCITONIN  LACTATE DEHYDROGENASE  FERRITIN  TRIGLYCERIDES  LACTIC ACID, PLASMA    EKG None  Radiology Dg Chest Port 1 View  Result Date: 03/27/2019 CLINICAL DATA:  Cough, shortness of breath. EXAM: PORTABLE CHEST 1 VIEW COMPARISON:  01/23/2019 FINDINGS: Moderate cardiac enlargement. Small to moderate right pleural effusion noted. Asymmetric opacity within the right base is identified. Mild diffuse increased interstitial markings noted. IMPRESSION: 1. Small to moderate right pleural effusion. 2. Asymmetric opacity within the right base may represent atelectasis or infiltrate. 3. Suspect mild CHF. 4. Cardiac enlargement. Electronically Signed   By: Kerby Moors M.D.   On: 03/27/2019 09:06    Procedures .Critical Care Performed by: Delia Heady, PA-C Authorized by: Delia Heady, PA-C   Critical care provider statement:    Critical care time (minutes):  35   Critical care time was exclusive of:  Separately billable procedures and treating other patients   Critical care was necessary to treat or prevent imminent or life-threatening deterioration of the following conditions:  Cardiac failure, respiratory failure and sepsis   Critical care was time spent personally by me on the following  activities:   Development of treatment plan with patient or surrogate, discussions with consultants, evaluation of patient's response to treatment, examination of patient, pulse oximetry, re-evaluation of patient's condition, review of old charts, ordering and review of radiographic studies and ordering and review of laboratory studies   I assumed direction of critical care for this patient from another provider in my specialty: no     (including critical care time)  Medications Ordered in ED Medications - No data to display   Initial Impression / Assessment and Plan / ED Course  I have reviewed the triage vital signs and the nursing notes.  Pertinent labs & imaging results that were available during my care of the patient were reviewed by me and considered in my medical decision making (see chart for details).        Bethany [name] was evaluated in Emergency Department on 03/27/19  for the symptoms described in the history of present illness. He/she was evaluated in the context of the global COVID-19 pandemic, which necessitated consideration that the patient might be at risk for infection with the SARS-CoV-2 virus that causes COVID-19. Institutional protocols and algorithms that pertain to the evaluation of patients at risk for COVID-19 are in a state of rapid change based on information released by regulatory bodies including the CDC and federal and state organizations. These policies and algorithms were followed during the patient's care in the ED.  68 year old female with a past medical history of COPD, hypertension, diabetes presenting to the ED with a chief complaint of shortness of breath.  Reports chills, body aches, intermittent dry cough for the past 4 days. Patient febrile with EMS, given antipyretics.  Oxygen saturations 83 to 85% on room air, patient placed on 3 L of oxygen via nasal cannula. Coarse breath sounds throughout.  No leg swelling, calf tenderness of concern me for DVT.  Covid labs  including Covid test are pending.  Chest x-ray with possible infiltrate versus effusion.  Patient will need to be admitted to hospitalist service for possible COVID infection and hypoxia.  Final Clinical Impressions(s) / ED Diagnoses   Final diagnoses:  Person under investigation for COVID-19    ED Discharge Orders    None     Portions of this note were generated with Dragon dictation software. Dictation errors may occur despite best attempts at proofreading.    Delia Heady, PA-C 03/27/19 [place], MD 03/29/19 [id number]

## 2019-03-27 NOTE — Telephone Encounter (Signed)
Returned patient's phone call regarding rescheduling an appointment, left a voicemail. 

## 2019-03-27 NOTE — H&P (Signed)
[facility] History and Physical    Bethany [name], is a 68 y.o. female  MRN: [id number]   DOB - 09-13-1950  Admit Date - 03/27/2019  Outpatient Primary MD for the patient is Wenda Low, MD  Outpatient Specialists: Dr. Burr Medico ( Hematology)   Patient coming from: home  Chief Complaint: Shortness of breath  HPI: Bethany [name] is a 68 y.o. female with medical history significant for chronic anemia secondary to chronic GI bleeding, HTN, DM who presents on 03/27/2019 with shortness of breath. Started initially Sunday after waking up, it worsened with minimal exertion, she used her albuterol inhaler x 3 times with resolution of symptoms. During physical therapy (leg exercises) on Tuesday she again noticed shortness of breath with less improvement with inhaler use. Denies any rhinorrhea.  ED triage note mentions generalized malaise and body aches, however patient denies having any of these prior to presentation during my interview.  Her  daughter called ambulance because she noted her mother's breathing was worsening.  EMS noted fever of 101 at home prior to coming to ED  Chronic iron deficient anemia. Last Thursday had iron infusion. Followed by Dr. Burr Medico.  Gets weekly outpatient IV Venofer infusions  Chronic GI bleeding. 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  Reports family history of clot disease.  Denies any history of DVT/PE.  Not on anticoagulation due to history of severe GI bleeding.  Previous work-up for lupus anticoagulant and antiphospholipid syndrome were negative.  COPD with persistent tobacco abuse.  Smokes half a pack per day.  Uses inhalers as needed   HTN.  Adherent with blood pressure regimen    ED Course:  T-max 99.3, respiratory rate 23, SPO2 87% on room air and started on 2 L O2.  Blood pressure normotensive.Covid test pending.  Labs notable for WBC of 10.9, hemoglobin  9.1, creatinine 1.4, D-dimer 0.98, CRP 4.6. Chest x-ray showed small/moderate right-sided pleural effusion and asymmetric right-sided opacity concerning for possible atelectasis or infiltrate, as well as cardiomegaly and concern for possible CHF Tried hospitalist service was called for further management and admission  Review of Systems:   In addition to the HPI above,  Constitutional:  No weight loss, night sweats, Fevers, chills, fatigue.  HEENT:  No headaches, Difficulty swallowing,Tooth/dental problems,Sore throat,  No sneezing, itching, ear ache, nasal congestion, post nasal drip,  Cardio-vascular:  No chest pain, Orthopnea, PND, swelling in lower extremities, anasarca, dizziness, palpitations  GI:  No heartburn, indigestion, abdominal pain, nausea, vomiting, diarrhea, change in bowel habits, loss of appetite  Resp:   No excess mucus, no productive cough, No non-productive cough, No coughing up of blood.No change in color of mucus.No wheezing.No chest wall deformity  Skin:  no rash or lesions.  GU:  no dysuria, change in color of urine, no urgency or frequency. No flank pain.  Musculoskeletal:  No joint pain or swelling. No decreased range of motion. No back pain.  Psych:  No change in mood or affect. No depression or anxiety. No memory loss.   A full 10 point Review of Systems was done, except as stated above, all other Review of Systems were negative.  Past Medical History:  Diagnosis Date  . Acute renal failure ([place])   . Arthritis   . Diabetes mellitus without complication ([place])    type II   . Hypercalcemia   . Hypertension   . Renal disorder   .  Single kidney   . Thrombosis    Past Surgical History:  Procedure Laterality Date  . ABDOMINAL HYSTERECTOMY    . blood clots removed from descending aorta     . CHOLECYSTECTOMY    . COLONOSCOPY    . COLONOSCOPY WITH PROPOFOL N/A 04/17/2017   Procedure: COLONOSCOPY WITH PROPOFOL;  Surgeon: Wilford Corner, MD;   Location: [facility];  Service: Endoscopy;  Laterality: N/A;  . ESOPHAGOGASTRODUODENOSCOPY (EGD) WITH PROPOFOL N/A 04/17/2017   Procedure: ESOPHAGOGASTRODUODENOSCOPY (EGD) WITH PROPOFOL;  Surgeon: Wilford Corner, MD;  Location: [facility];  Service: Endoscopy;  Laterality: N/A;  . NEPHRECTOMY RECIPIENT     Social History:  reports that she has been smoking cigarettes. She has a 10.00 pack-year smoking history. She has never used smokeless tobacco. She reports that she does not drink alcohol or use drugs.  Allergies  Allergen Reactions  . Contrast Media [Iodinated Diagnostic Agents]     Renal hypertension per physician    Family History  Problem Relation Age of Onset  . Anemia Mother   . Diabetes Father   . Hypertension Brother   . Breast cancer Maternal Aunt        46s  . Breast cancer Maternal Aunt 75       colon cancer      Prior to Admission medications   Medication Sig Start Date End Date Taking? Authorizing Provider  amLODipine (NORVASC) 5 MG tablet Take 1 tablet (5 mg total) by mouth daily. 03/18/17  Yes Hongalgi, Lenis Dickinson, MD  Dulaglutide (TRULICITY) 1.5 0000000 SOPN Inject 1.5 mg into the skin once a week. Mondays   Yes [provider]  ergocalciferol (VITAMIN D2) 1.25 MG (50000 UT) capsule Take 50,000 Units by mouth once a week. Mondays   Yes [provider]  gabapentin (NEURONTIN) 600 MG tablet Take 600 mg by mouth 4 (four) times daily.    Yes [provider]  hydrALAZINE (APRESOLINE) 25 MG tablet Take 25 mg by mouth 3 (three) times daily.   Yes [provider]  Insulin Glargine (TOUJEO SOLOSTAR) 300 UNIT/ML SOPN Inject 40 Units into the skin at bedtime.    Yes [provider]  insulin lispro (HUMALOG KWIKPEN) 100 UNIT/ML KiwkPen Inject 24 Units into the skin daily with supper.  09/19/16  Yes [provider]  Ipratropium-Albuterol (COMBIVENT) 20-100 MCG/ACT AERS respimat Inhale 1 puff into the lungs every 6 (six)  hours as needed for wheezing or shortness of breath. 03/17/17  Yes Hongalgi, Lenis Dickinson, MD  levothyroxine (SYNTHROID) 75 MCG tablet Take 75 mcg by mouth daily. 02/28/19  Yes [provider]  magnesium 30 MG tablet Take 30 mg by mouth daily.    Yes [provider]  metoprolol (LOPRESSOR) 50 MG tablet Take 50 mg by mouth 2 (two) times daily.  03/06/14  Yes [provider]  oxyCODONE-acetaminophen (PERCOCET) 10-325 MG tablet Take 1 tablet by mouth every 6 (six) hours as needed for pain.   Yes [provider]  pantoprazole (PROTONIX) 40 MG tablet Take 40 mg by mouth daily.   Yes [provider]  pregabalin (LYRICA) 25 MG capsule Take 25 mg by mouth daily. 01/24/19  Yes [provider]  XTAMPZA ER 27 MG C12A Take 1 capsule by mouth 2 (two) times daily. 02/27/19  Yes [provider]  PRALUENT 75 MG/ML SOPN Inject 75 mg as directed every 14 (fourteen) days. 03/15/17   [provider]   Physical Exam: Vitals:   03/27/19 1055 03/27/19  1342 03/27/19 1600 03/27/19 1615  BP: 139/63 (!) 145/51 (!) 137/56 138/67  Pulse: 72 68 80 84  Resp: 18 16 15  (!) 23  Temp:      TempSrc:      SpO2: 100% 95% 100% 100%  Weight:      Height:        Wt Readings from Last 3 Encounters:  03/27/19 84.4 kg  02/27/19 83.1 kg  01/30/19 82.5 kg    Constitutional normal appearing, lying comfortably in bed Eyes: EOMI, anicteric, normal conjunctivae ENMT: Oropharynx with moist mucous membranes, normal dentition Neck: FROM, no JVD appreciated Cardiovascular: RRR no MRGs, with no peripheral edema Respiratory: Normal respiratory effort on 3 L, diminished breath sounds at right base, slight crackles bilaterally, no wheezing Abdomen: Soft,non-tender,  Musculoskeletal: no clubbing / cyanosis. No joint deformity upper and lower extremities. Good ROM, no contractures. Normal muscle tone. Skin: No rash ulcers, or lesions. Without skin tenting  Neurologic:  Grossly no focal neuro deficit. Psychiatric:Appropriate affect, and mood. Mental status AAOx3          Labs on Admission:  Basic Metabolic Panel: Recent Labs  Lab 03/27/19 0907  NA 136  K 4.7  CL 102  CO2 23  GLUCOSE 107*  BUN 16  CREATININE 1.40*  CALCIUM 9.7   Liver Function Tests: Recent Labs  Lab 03/27/19 0907  AST 15  ALT 11  ALKPHOS 150*  BILITOT 0.6  PROT 6.5  ALBUMIN 3.1*   No results for input(s): LIPASE, AMYLASE in the last 168 hours. No results for input(s): AMMONIA in the last 168 hours. CBC: Recent Labs  Lab 03/27/19 0907  WBC 10.9*  NEUTROABS 8.3*  HGB 9.1*  HCT 33.0*  MCV 77.1*  PLT 346   Cardiac Enzymes: No results for input(s): CKTOTAL, CKMB, CKMBINDEX, TROPONINI in the last 168 hours.  BNP (last 3 results) No results for input(s): BNP in the last 8760 hours.  ProBNP (last 3 results) No results for input(s): PROBNP in the last 8760 hours.  CBG: No results for input(s): GLUCAP in the last 168 hours.  Radiological Exams on Admission: Dg Chest Port 1 View  Result Date: 03/27/2019 CLINICAL DATA:  Cough, shortness of breath. EXAM: PORTABLE CHEST 1 VIEW COMPARISON:  01/23/2019 FINDINGS: Moderate cardiac enlargement. Small to moderate right pleural effusion noted. Asymmetric opacity within the right base is identified. Mild diffuse increased interstitial markings noted. IMPRESSION: 1. Small to moderate right pleural effusion. 2. Asymmetric opacity within the right base may represent atelectasis or infiltrate. 3. Suspect mild CHF. 4. Cardiac enlargement. Electronically Signed   By: Kerby Moors M.D.   On: 03/27/2019 09:06    EKG: pending  Assessment/Plan  Active Problems:   Iron deficiency anemia secondary to blood loss (chronic)   Chronic kidney disease (CKD), stage III (moderate)   Diabetes mellitus type 2 with neurological manifestations (HCC)   History of thrombosis   Acute respiratory failure with hypoxia (HCC)   Fever    Shortness of breath    1.   Acute hypoxic respiratory failure.  Highest concern for Covid given fever, shortness of breath.  Covid test pending.  Also check for flu/RVP.  Empiric ceftriaxone, azithromycin to cover for potential bacterial pneumonia given opacity on chest x-ray, monitor blood cultures.  Noninfectious differential includes PE given history of arterial thrombosis and elevated D-dimer, obtain venous duplex and VQ scan (CTA contraindicated given CKD stage III, one kidney), COPD flare no active wheezing, new CHF given cardiomegaly/pleural effusion, checking  TTE and BNP, symptomatic anemia, hemoglobin stable with no signs of bleeding.  2. Moderate sized right pleural effusion.  Given cardiomegaly some concern for CHF, check TTE, BNP.  Given fever could also represent potential parapneumonic effusion, closely monitor.  3. AKI on CKD stage III, status post bilateral nephrectomy, suspect prerenal.  Baseline creatinine 1.1-1.2.  Currently 1.4.  Avoid nephrotoxins.  Repeat BMP in a.m., monitor output  4. History of arterial thrombosis status post unilateral nephrectomy to damage from thrombosis has family history of clot disease.  Not on anticoagulation due to history of GI bleed.  Currently evaluating for potential clots as mentioned above.  5. Chronic GI bleed history with chronic iron deficiency anemia.  Receives IV Venofer via Dr. Annamaria Boots as outpatient.  Previous Ingal baseline around 10, stable around 8 for the past month, denies any melena or BRBPR, monitor CBC  6. COPD.  No active wheezing.  Is requiring O2 but I think this is elated to some other etiologies as mentioned above.  Closely monitor.  Home inhaler as needed  7. Type 2 diabetes with peripheral neuropathy, A1c 6.6( 2018).  Hold home Trulicity, continue Lantus 40 units, sliding scale as needed.  Continue gabapentin 600 mg 4 times daily and Lyrica 25 mg daily, check A1c  8. Hypertension, stable.  Continue home hydralazine 25 mg 3  times daily, Lopressor 50 mg twice daily, amlodipine 5 mg daily.  9. Hypothyroidism, stable.  Check TSH.  Continue Synthroid 25 mcg daily  10. GERD, stable.  Continue home PPI      DVT Prophylaxis Heparin   AM Labs Ordered, also please review Full Orders  Family Communication: Admission, patients condition and plan of care including tests being ordered have been discussed with the patient and no family at bedside  Code Status DNR  Likely DC to home  Consults called: None  Admission status: Inpatient, currently requiring 2 L, needs comprehensive work-up Covid pending, TTE, venous duplex, VQ scan, blood cultures, currently requiring IV antibiotics  Time spent in minutes : 30 minutes   Desiree Hane M.D on 03/27/2019 at 5:19 PM  To page go to [email or web address] - password [email or web address]   If 7PM-7AM, please contact night-coverage [email or web address] Password [facility]  03/27/2019, 5:19 PM

## 2019-03-27 NOTE — ED Notes (Signed)
Pt transported to [facility].  

## 2019-03-28 ENCOUNTER — Inpatient Hospital Stay (HOSPITAL_COMMUNITY): Payer: Medicare Other

## 2019-03-28 ENCOUNTER — Other Ambulatory Visit: Payer: Self-pay

## 2019-03-28 DIAGNOSIS — J9601 Acute respiratory failure with hypoxia: Secondary | ICD-10-CM

## 2019-03-28 DIAGNOSIS — D500 Iron deficiency anemia secondary to blood loss (chronic): Secondary | ICD-10-CM

## 2019-03-28 DIAGNOSIS — I509 Heart failure, unspecified: Secondary | ICD-10-CM

## 2019-03-28 DIAGNOSIS — E1149 Type 2 diabetes mellitus with other diabetic neurological complication: Secondary | ICD-10-CM

## 2019-03-28 DIAGNOSIS — R0602 Shortness of breath: Secondary | ICD-10-CM

## 2019-03-28 DIAGNOSIS — N1832 Chronic kidney disease, stage 3b: Secondary | ICD-10-CM

## 2019-03-28 LAB — COMPREHENSIVE METABOLIC PANEL
ALT: 9 U/L (ref 0–44)
AST: 13 U/L — ABNORMAL LOW (ref 15–41)
Albumin: 2.7 g/dL — ABNORMAL LOW (ref 3.5–5.0)
Alkaline Phosphatase: 144 U/L — ABNORMAL HIGH (ref 38–126)
Anion gap: 7 (ref 5–15)
BUN: 16 mg/dL (ref 8–23)
CO2: 26 mmol/L (ref 22–32)
Calcium: 9.7 mg/dL (ref 8.9–10.3)
Chloride: 106 mmol/L (ref 98–111)
Creatinine, Ser: 1.25 mg/dL — ABNORMAL HIGH (ref 0.44–1.00)
GFR calc Af Amer: 51 mL/min — ABNORMAL LOW (ref >60)
GFR calc non Af Amer: 44 mL/min — ABNORMAL LOW (ref >60)
Glucose, Bld: 115 mg/dL — ABNORMAL HIGH (ref 70–99)
Potassium: 4.4 mmol/L (ref 3.5–5.1)
Sodium: 139 mmol/L (ref 135–145)
Total Bilirubin: 0.5 mg/dL (ref 0.3–1.2)
Total Protein: 6.1 g/dL — ABNORMAL LOW (ref 6.5–8.1)

## 2019-03-28 LAB — CBC
HCT: 31.4 % — ABNORMAL LOW (ref 36.0–46.0)
Hemoglobin: 8.6 g/dL — ABNORMAL LOW (ref 12.0–15.0)
MCH: 20.9 pg — ABNORMAL LOW (ref 26.0–34.0)
MCHC: 27.4 g/dL — ABNORMAL LOW (ref 30.0–36.0)
MCV: 76.2 fL — ABNORMAL LOW (ref 80.0–100.0)
Platelets: 312 10*3/uL (ref 150–400)
RBC: 4.12 MIL/uL (ref 3.87–5.11)
RDW: 23.7 % — ABNORMAL HIGH (ref 11.5–15.5)
WBC: 9.6 10*3/uL (ref 4.0–10.5)
nRBC: 0 % (ref 0.0–0.2)

## 2019-03-28 LAB — GLUCOSE, CAPILLARY
Glucose-Capillary: 139 mg/dL — ABNORMAL HIGH (ref 70–99)
Glucose-Capillary: 171 mg/dL — ABNORMAL HIGH (ref 70–99)
Glucose-Capillary: 145 mg/dL — ABNORMAL HIGH (ref 70–99)
Glucose-Capillary: 174 mg/dL — ABNORMAL HIGH (ref 70–99)

## 2019-03-28 LAB — ECHOCARDIOGRAM COMPLETE
Height: 65 in
Weight: 2860.69 [oz_av]

## 2019-03-28 LAB — PROCALCITONIN: Procalcitonin: <0.1 ng/mL

## 2019-03-28 MED ORDER — ALBUTEROL SULFATE (2.5 MG/3ML) 0.083% IN NEBU: 2.5000 mg | INHALATION_SOLUTION | RESPIRATORY_TRACT | Status: DC | PRN

## 2019-03-28 MED ORDER — ORAL CARE MOUTH RINSE
15.0000 mL | Freq: Two times a day (BID) | OROMUCOSAL | Status: DC
  Administered 2019-03-28 – 2019-03-30 (×5): 15 mL via OROMUCOSAL

## 2019-03-28 MED ORDER — OXYCODONE HCL ER 15 MG PO T12A
30.0000 mg | EXTENDED_RELEASE_TABLET | Freq: Two times a day (BID) | ORAL | Status: DC
  Administered 2019-03-28 – 2019-03-30 (×4): 30 mg via ORAL
  Filled 2019-03-28 (×4): qty 2

## 2019-03-28 MED ORDER — SODIUM CHLORIDE 0.9 % IV SOLN
510.0000 mg | Freq: Once | INTRAVENOUS | Status: AC
  Administered 2019-03-28: 510 mg via INTRAVENOUS
  Filled 2019-03-28: qty 17

## 2019-03-28 MED ORDER — FUROSEMIDE 10 MG/ML IJ SOLN
40.0000 mg | Freq: Two times a day (BID) | INTRAMUSCULAR | Status: AC
  Administered 2019-03-29 (×2): 40 mg via INTRAVENOUS
  Filled 2019-03-28 (×2): qty 4

## 2019-03-28 MED ORDER — INSULIN ASPART 100 UNIT/ML ~~LOC~~ SOLN
0.0000 [IU] | Freq: Three times a day (TID) | SUBCUTANEOUS | Status: DC
  Administered 2019-03-28: 2 [IU] via SUBCUTANEOUS
  Administered 2019-03-28: 3 [IU] via SUBCUTANEOUS
  Administered 2019-03-29 (×2): 2 [IU] via SUBCUTANEOUS

## 2019-03-28 MED ORDER — OXYCODONE ER 27 MG PO C12A: 1.0000 | EXTENDED_RELEASE_CAPSULE | Freq: Two times a day (BID) | ORAL | Status: DC

## 2019-03-28 MED ORDER — INSULIN ASPART 100 UNIT/ML ~~LOC~~ SOLN: 0.0000 [IU] | Freq: Every day | SUBCUTANEOUS | Status: DC

## 2019-03-28 MED ORDER — FUROSEMIDE 10 MG/ML IJ SOLN
40.0000 mg | Freq: Three times a day (TID) | INTRAMUSCULAR | Status: AC
  Administered 2019-03-28 (×2): 40 mg via INTRAVENOUS
  Filled 2019-03-28 (×2): qty 4

## 2019-03-28 NOTE — Progress Notes (Addendum)
PROGRESS NOTE    Bethany [name]  [id number] DOB: [birth year] DOA: 03/27/2019 PCP: Bethany Low, MD      Brief Narrative:  Bethany [name] is a 68 y.o. F with HTN, DM, hypothyroidism, iron deficiency anemia, s/p nephrectomy for renal artery thrombosis, CKD 3a, depression, COPD not on home O2 and chronic pain who presented with 1 to 2 days of shortness of breath, not relieved with albuterol.  In the ER, patchy R>L opacity, right effusion, cardiomegaly.  90 9.58F, SPO2 87% on room air.  Skin negative for PE.   Assessment & Plan:  Acute hypoxic respiratory failure Cardiomegaly, elevated BNP, bilateral infiltrates suggest CHF.  VQ scan rules out PE.  Covid negative.  Normal procalcitonin and WBC, absence of fever militate against infection.   Right pleural effusion Suspected acute CHF EF unknown. 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  Hemoglobin 8.6, stable relative to baseline.  Discussed with Bethany [name], she will order Venofer while in house.  Asthma/COPD No wheezing, doubt bronchospasm. -Albuterol as needed  Diabetes Glucoses controlled -Continue Lantus -Start sliding scale correction insulin -Continue atorvastatin -Hold home Trulicity  Hypothyroidism -Continue levothyroxine  Hypertension Blood pressure controlled -Continue amlodipine, hydralazine, metoprolol  Chronic pain -Continue Lyrica, gabapentin -Continue home oxycodone ER and IR  GERD Continue pantoprazole      MDM and disposition: The below labs and imaging reports were reviewed and  summarized above.  Medication management as above.  Severe exacerbation of heart failure.  The patient was admitted with acute hypoxic respiratory failure.  Covid is been ruled out, PE has been ruled out.  Her laboratory work and vitals noted against infection.  We will start diuresis, obtain echocardiogram, she will likely need several days of diuresis.  New onset heart failure.       DVT prophylaxis: Heparin Code Status: DO NOT RESUSCITATE Family Communication:     Consultants:     Procedures:   11/12 US doppler legs -- NO DVT  11/11 VQ scan -- [name] risk for PE  11/12 Echo -00 pending  Antimicrobials:   Ceftriaxone x1 11/11  Azithromycin x1 11/11    Subjective: She is very out of breath just walking back from the bathroom.  She has had some orthopnea recently.  No leg swelling, no fever, no sputum production.  Appetite okay.  Back pain stable relative to baseline.    Objective: Vitals:   03/27/19 2132 03/27/19 2232 03/28/19 0329 03/28/19 0733  BP: (!) 166/59   (!) 129/56  Pulse: 97   (!) 55  Resp: 17     Temp: 98.9 F (37.2 C)   98.2 F (36.8 C)  TempSrc: Oral   Oral  SpO2: (!) 85% 95%  97%  Weight:   81.1 kg   Height:        Intake/Output Summary (Last 24 hours) at 03/28/2019 1223 Last data filed at 03/28/2019 1200 Gross per 24 hour  Intake 100 ml  Output -  Net 100 ml   Filed Weights   03/27/19 0825 03/28/19 0329  Weight: 84.4 kg 81.1 kg    Examination:  General appearance: well-nourished adult female, alert and in mild distress from dyspnea, fatigue.   HEENT: Anicteric, conjunctiva pink, lids and lashes normal. No nasal deformity, discharge, epistaxis.  Lips moist, dentition normal, oropharynx moist, no oral lesions, hearing normal.   Skin: Warm and dry.   No suspicious rashes or lesions. Cardiac: RRR, nl S1-S2, no murmurs appreciated.  Capillary refill is brisk.  JVP elevated above clavicle.  Trace LE edema.  Radial pulses 2+ and symmetric.  Respiratory: Tachypneic.  Rales bilaterally at the bases, diminished in the right base. Abdomen: Abdomen soft.  No TTP or guarding. No ascites, distension, hepatosplenomegaly.   MSK: No deformities or effusions.  Normal muscle bulk and tone. Neuro: Awake and alert.  EOMI, moves all extremities with global weakness, normal coordination. Speech fluent.    Psych: Sensorium intact and responding to questions, attention normal. Affect normal.  Judgment and insight appear normal.    Data Reviewed: I have personally reviewed following labs and imaging studies:  CBC: Recent Labs  Lab 03/27/19 0907 03/27/19 2157 03/28/19 0351  WBC 10.9* 11.3* 9.6  NEUTROABS 8.3*  --   --   HGB 9.1* 9.0* 8.6*  HCT 33.0* 32.2* 31.4*  MCV 77.1* 75.4* 76.2*  PLT 346 306 [id number]   Basic Metabolic Panel: Recent Labs  Lab 03/27/19 0907 03/28/19 0351  NA 136 139  K 4.7 4.4  CL 102 106  CO2 23 26  GLUCOSE 107* 115*  BUN 16 16  CREATININE 1.40* 1.25*  CALCIUM 9.7 9.7   GFR: Estimated Creatinine Clearance: 44.7 mL/min (A) (by C-G formula based on SCr of 1.25 mg/dL (H)). Liver Function Tests: Recent Labs  Lab 03/27/19 0907 03/28/19 0351  AST 15 13*  ALT 11 9  ALKPHOS 150* 144*  BILITOT 0.6 0.5  PROT 6.5 6.1*  ALBUMIN 3.1* 2.7*   No results for input(s): LIPASE, AMYLASE in the last 168 hours. No results for input(s): AMMONIA in the last 168 hours. Coagulation Profile: No results for input(s): INR, PROTIME in the last 168 hours. Cardiac Enzymes: No results for input(s): CKTOTAL, CKMB, CKMBINDEX, TROPONINI in the last 168 hours. BNP (last 3 results) No results for input(s): PROBNP in the last 8760 hours. HbA1C: Recent Labs    03/27/19 2157  HGBA1C 5.4   CBG: Recent Labs  Lab 03/27/19 2134 03/28/19 0735 03/28/19 1154  GLUCAP 154* 139* 171*   Lipid Profile: Recent Labs    03/27/19 0907  TRIG 132   Thyroid Function Tests: Recent Labs    03/27/19 2157  TSH 1.126   Anemia Panel:  Recent Labs    03/27/19 0907  FERRITIN 74   Urine analysis: No results found for: COLORURINE, APPEARANCEUR, LABSPEC, PHURINE, GLUCOSEU, HGBUR, BILIRUBINUR, KETONESUR, PROTEINUR, UROBILINOGEN, NITRITE, LEUKOCYTESUR Sepsis Labs: @LABRCNTIP (procalcitonin:4,lacticacidven:4)  ) Recent Results (from the past 240 hour(s))  Blood Culture (routine x 2)     Status: None (Preliminary result)   Collection Time: 03/27/19  9:42 AM   Specimen: BLOOD RIGHT HAND  Result Value Ref Range Status   Specimen Description BLOOD RIGHT HAND  Final   Special Requests   Final    BOTTLES DRAWN AEROBIC AND ANAEROBIC Blood Culture adequate volume   Culture   Final    NO GROWTH < 24 HOURS Performed at [facility], [place] [address] [place], [place] [address]    Report Status PENDING  Incomplete  Blood Culture (routine x 2)     Status: None (Preliminary result)   Collection Time: 03/27/19  9:42 AM   Specimen: BLOOD LEFT HAND  Result Value Ref Range Status   Specimen Description BLOOD LEFT HAND  Final   Special Requests   Final    BOTTLES DRAWN AEROBIC AND ANAEROBIC Blood Culture results may not be optimal due to an inadequate volume of blood received in culture bottles   Culture   Final    NO GROWTH < 24 HOURS Performed at [place] [address] [place], [place] [address]    Report Status PENDING  Incomplete  SARS CORONAVIRUS 2 (TAT 6-24 HRS) Nasopharyngeal Nasopharyngeal Swab     Status: None   Collection Time: 03/27/19  1:57 PM   Specimen: Nasopharyngeal Swab  Result Value Ref Range Status   SARS Coronavirus 2 NEGATIVE NEGATIVE Final    Comment: (NOTE) SARS-CoV-2 target nucleic acids are NOT DETECTED. The SARS-CoV-2 RNA is generally detectable in upper and lower respiratory specimens during the acute phase of infection. Negative results do not preclude SARS-CoV-2 infection, do not rule out co-infections with other pathogens, and should not be used as the sole basis for treatment or  other patient management decisions. Negative results must be combined with clinical observations, patient history, and epidemiological information. The expected result is Negative. Fact Sheet for Patients: SugarRoll.be Fact Sheet for Healthcare Providers: https://www.woods-mathews.com/ This test is not yet approved or cleared by the [place] FDA and  has been authorized for detection and/or diagnosis of SARS-CoV-2 by FDA under an Emergency Use Authorization (EUA). This EUA will remain  in effect (meaning this test can be used) for the duration of the COVID-19 declaration under Section 56 4(b)(1) of the Act, 21 U.S.C. section 360bbb-3(b)(1), unless the authorization is terminated or revoked sooner. Performed at [facility], [place] [address] [place], [place] [address]          Radiology Studies: Nm Pulmonary Perfusion  Result Date: 03/27/2019 CLINICAL DATA:  Respiratory failure.  COPD. EXAM: NUCLEAR MEDICINE PERFUSION LUNG SCAN TECHNIQUE: Perfusion images were obtained in multiple projections after intravenous injection of radiopharmaceutical. Ventilation scans intentionally deferred if perfusion scan and chest x-ray adequate for interpretation during COVID 19 epidemic. RADIOPHARMACEUTICALS:  1.5 mCi Tc-36m MAA IV COMPARISON:  Chest radiograph of earlier today. FINDINGS: No wedge-shaped perfusion defects to suggest pulmonary embolism. IMPRESSION: No evidence of pulmonary embolism. Electronically Signed   By: Abigail Miyamoto M.D.   On: 03/27/2019 18:39   Dg Chest Port 1 View  Result Date: 03/28/2019 CLINICAL DATA:  Acute respiratory failure with hypoxia. EXAM: PORTABLE CHEST 1 VIEW COMPARISON:  March 27, 2019. FINDINGS: Stable cardiomediastinal silhouette. No pneumothorax is noted. Left lung is clear. Increased right basilar opacity is noted concerning for atelectasis or infiltrate. Small right pleural effusion may be present. Bony  thorax is unremarkable. IMPRESSION: Increased right basilar opacity as described above. Electronically Signed   By: Marijo Conception M.D.   On: 03/28/2019 07:12   Dg Chest Port 1 View  Result Date: 03/27/2019 CLINICAL DATA:  Cough, shortness of breath. EXAM: PORTABLE CHEST 1 VIEW COMPARISON:  01/23/2019 FINDINGS: Moderate cardiac enlargement. Small to moderate right pleural effusion noted. Asymmetric opacity within the right base is identified. Mild diffuse increased interstitial markings noted. IMPRESSION: 1. Small to moderate right pleural effusion. 2. Asymmetric opacity within the right base may represent atelectasis or infiltrate. 3. Suspect mild CHF. 4. Cardiac enlargement. Electronically Signed   By: Kerby Moors M.D.   On: 03/27/2019 09:06   Vas [place] Lower Extremity Venous (dvt)  Result Date: 03/28/2019  Lower Venous Study Indications: SOB.  Risk Factors: None identified. Limitations: Poor ultrasound/tissue interface. Comparison Study: No prior studies. Performing Technologist: Oliver Hum RVT  Examination Guidelines: A complete evaluation includes B-mode imaging, spectral Doppler, color Doppler, and power Doppler as needed of all accessible portions of each vessel. Bilateral testing is considered an integral part of a complete examination. Limited examinations for reoccurring indications may be performed as noted.  +---------+---------------+---------+-----------+----------+--------------+ RIGHT    CompressibilityPhasicitySpontaneityPropertiesThrombus Aging +---------+---------------+---------+-----------+----------+--------------+ CFV      Full           Yes      Yes                                 +---------+---------------+---------+-----------+----------+--------------+ SFJ      Full                                                        +---------+---------------+---------+-----------+----------+--------------+ FV Prox  Full                                                         +---------+---------------+---------+-----------+----------+--------------+ FV Mid   Full                                                        +---------+---------------+---------+-----------+----------+--------------+ FV DistalFull                                                        +---------+---------------+---------+-----------+----------+--------------+ PFV      Full                                                        +---------+---------------+---------+-----------+----------+--------------+ POP      Full           Yes      Yes                                 +---------+---------------+---------+-----------+----------+--------------+ PTV      Full                                                        +---------+---------------+---------+-----------+----------+--------------+ PERO     Full                                                        +---------+---------------+---------+-----------+----------+--------------+   +---------+---------------+---------+-----------+----------+--------------+  LEFT     CompressibilityPhasicitySpontaneityPropertiesThrombus Aging +---------+---------------+---------+-----------+----------+--------------+ CFV      Full           Yes      Yes                                 +---------+---------------+---------+-----------+----------+--------------+ SFJ      Full                                                        +---------+---------------+---------+-----------+----------+--------------+ FV Prox  Full                                                        +---------+---------------+---------+-----------+----------+--------------+ FV Mid   Full                                                        +---------+---------------+---------+-----------+----------+--------------+ FV DistalFull                                                         +---------+---------------+---------+-----------+----------+--------------+ PFV      Full                                                        +---------+---------------+---------+-----------+----------+--------------+ POP      Full           Yes      Yes                                 +---------+---------------+---------+-----------+----------+--------------+ PTV      Full                                                        +---------+---------------+---------+-----------+----------+--------------+ PERO     Full                                                        +---------+---------------+---------+-----------+----------+--------------+     Summary: Right: There is no evidence of deep vein thrombosis in the lower extremity. No cystic structure found in the popliteal fossa. Left: There is no evidence of deep vein thrombosis in the lower extremity. No cystic structure found in the popliteal fossa.  *  See table(s) above for measurements and observations.    Preliminary         Scheduled Meds: . amLODipine  5 mg Oral Daily  . atorvastatin  20 mg Oral Daily  . feeding supplement (ENSURE ENLIVE)  237 mL Oral BID BM  . furosemide  40 mg Intravenous Q8H   Followed by  . [START ON 03/29/2019] furosemide  40 mg Intravenous BID  . gabapentin  600 mg Oral QID  . heparin  5,000 Units Subcutaneous Q8H  . hydrALAZINE  25 mg Oral TID  . insulin aspart  0-15 Units Subcutaneous TID WC  . insulin aspart  0-5 Units Subcutaneous QHS  . insulin glargine  40 Units Subcutaneous QHS  . levothyroxine  75 mcg Oral Q0600  . mouth rinse  15 mL Mouth Rinse BID  . metoprolol tartrate  50 mg Oral BID  . oxyCODONE ER  1 capsule Oral BID  . pantoprazole  40 mg Oral Daily  . pregabalin  25 mg Oral Daily   Continuous Infusions:    LOS: 1 day    Time spent: 35 minutes    Edwin Dada, MD [facility] 03/28/2019, 12:23 PM     Please page though [place] or Epic  secure chat:  For [employer], Adult nurse

## 2019-03-28 NOTE — Progress Notes (Signed)
Bilateral lower extremity venous duplex has been completed. Preliminary results can be found in CV Proc through chart review.   03/28/19 9:02 AM Carlos Levering RVT

## 2019-03-28 NOTE — Progress Notes (Signed)
Initial Nutrition Assessment  INTERVENTION:   -Ensure Enlive po BID, each supplement provides 350 kcal and 20 grams of protein  NUTRITION DIAGNOSIS:   Increased nutrient needs related to chronic illness as evidenced by estimated needs.  GOAL:   Patient will meet greater than or equal to 90% of their needs  MONITOR:   PO intake, Supplement acceptance, Labs, Weight trends, I & O's  REASON FOR ASSESSMENT:   Malnutrition Screening Tool    ASSESSMENT:   68 y.o. F with HTN, DM, hypothyroidism, iron deficiency anemia, s/p nephrectomy for renal artery thrombosis, CKD 3a, depression, COPD not on home O2 and chronic pain who presented with 1 to 2 days of shortness of breath, not relieved with albuterol.  **RD working remotely**  Patient has had SOB for 4 days PTA. COVID-19 test was negative.  Pt reports adequate appetite. No PO documented of meals yet. Pt is drinking Ensure supplements.   Per weight records, pt with insignificant weight loss but weights have been trending down since February 2020.  I/Os: -76 ml since admit UOP: 650 ml x 24 hrs  Medications: IV Lasix Labs reviewed:  CBGs: 139-171  NUTRITION - FOCUSED PHYSICAL EXAM:  Working remotely.  Diet Order:   Diet Order            Diet Carb Modified Fluid consistency: Thin; Room service appropriate? Yes  Diet effective now              EDUCATION NEEDS:   No education needs have been identified at this time  Skin:  Skin Assessment: Reviewed RN Assessment  Last BM:  11/9  Height:   Ht Readings from Last 1 Encounters:  03/27/19 5\' 5"  (1.651 m)    Weight:   Wt Readings from Last 1 Encounters:  03/28/19 81.1 kg    Ideal Body Weight:  56.8 kg  BMI:  Body mass index is 29.75 kg/m.  Estimated Nutritional Needs:   Kcal:  1600-1800  Protein:  70-80g  Fluid:  1.6L/day  Clayton Bibles, MS, RD, LDN Inpatient Clinical Dietitian Pager: [phone] After Hours Pager: [phone]

## 2019-03-28 NOTE — Progress Notes (Signed)
HEMATOLOGY-ONCOLOGY PROGRESS NOTE  SUBJECTIVE: Bethany [name] was admitted for shortness of breath thought to be due to CHF.  Reports that she received her IV iron last week and did not notice any improvement in her fatigue.  She states that she usually feels better after receiving her IV iron.  Gets more short of breath with ambulation.  She has not noticed any bleeding.  REVIEW OF SYSTEMS:   As noted in the HPI.  I have reviewed the past medical history, past surgical history, social history and family history with the patient and they are unchanged from previous note.   PHYSICAL EXAMINATION:  Vitals:   03/27/19 2232 03/28/19 0733  BP:  (!) 129/56  Pulse:  (!) 55  Resp:    Temp:  98.2 F (36.8 C)  SpO2: 95% 97%   Filed Weights   03/27/19 0825 03/28/19 0329  Weight: 186 lb (84.4 kg) 178 lb 12.7 oz (81.1 kg)    Intake/Output from previous day: No intake/output data recorded.  GENERAL:alert, no distress and comfortable SKIN: skin color, texture, turgor are normal, no rashes or significant lesions EYES: normal, Conjunctiva are pink and non-injected, sclera clear OROPHARYNX:no exudate, no erythema and lips, buccal mucosa, and tongue normal  NECK: supple, thyroid normal size, non-tender, without nodularity LYMPH:  no palpable lymphadenopathy in the cervical, axillary or inguinal LUNGS: Rales at the bilateral bases HEART: regular rate & rhythm and no murmurs and no lower extremity edema ABDOMEN:abdomen soft, non-tender and normal bowel sounds Musculoskeletal:no cyanosis of digits and no clubbing  NEURO: alert & oriented x 3 with fluent speech, no focal motor/sensory deficits  LABORATORY DATA:  I have reviewed the data as listed CMP Latest Ref Rng & Units 03/28/2019 03/27/2019 01/30/2019  Glucose 70 - 99 mg/dL 115(H) 107(H) 85  BUN 8 - 23 mg/dL 16 16 25(H)  Creatinine 0.44 - 1.00 mg/dL 1.25(H) 1.40(H) 1.19(H)  Sodium 135 - 145 mmol/L 139 136 141  Potassium 3.5 - 5.1 mmol/L 4.4 4.7  4.3  Chloride 98 - 111 mmol/L 106 102 106  CO2 22 - 32 mmol/L 26 23 26   Calcium 8.9 - 10.3 mg/dL 9.7 9.7 10.5(H)  Total Protein 6.5 - 8.1 g/dL 6.1(L) 6.5 7.2  Total Bilirubin 0.3 - 1.2 mg/dL 0.5 0.6 0.3  Alkaline Phos 38 - 126 U/L 144(H) 150(H) 185(H)  AST 15 - 41 U/L 13(L) 15 45(H)  ALT 0 - 44 U/L 9 11 41    Lab Results  Component Value Date   WBC 9.6 03/28/2019   HGB 8.6 (L) 03/28/2019   HCT 31.4 (L) 03/28/2019   MCV 76.2 (L) 03/28/2019   PLT 312 03/28/2019   NEUTROABS 8.3 (H) 03/27/2019    Nm Pulmonary Perfusion  Result Date: 03/27/2019 CLINICAL DATA:  Respiratory failure.  COPD. EXAM: NUCLEAR MEDICINE PERFUSION LUNG SCAN TECHNIQUE: Perfusion images were obtained in multiple projections after intravenous injection of radiopharmaceutical. Ventilation scans intentionally deferred if perfusion scan and chest x-ray adequate for interpretation during COVID 19 epidemic. RADIOPHARMACEUTICALS:  1.5 mCi Tc-78m MAA IV COMPARISON:  Chest radiograph of earlier today. FINDINGS: No wedge-shaped perfusion defects to suggest pulmonary embolism. IMPRESSION: No evidence of pulmonary embolism. Electronically Signed   By: Abigail Miyamoto M.D.   On: 03/27/2019 18:39   Dg Chest Port 1 View  Result Date: 03/28/2019 CLINICAL DATA:  Acute respiratory failure with hypoxia. EXAM: PORTABLE CHEST 1 VIEW COMPARISON:  March 27, 2019. FINDINGS: Stable cardiomediastinal silhouette. No pneumothorax is noted. Left lung is clear. Increased  right basilar opacity is noted concerning for atelectasis or infiltrate. Small right pleural effusion may be present. Bony thorax is unremarkable. IMPRESSION: Increased right basilar opacity as described above. Electronically Signed   By: Marijo Conception M.D.   On: 03/28/2019 07:12   Dg Chest Port 1 View  Result Date: 03/27/2019 CLINICAL DATA:  Cough, shortness of breath. EXAM: PORTABLE CHEST 1 VIEW COMPARISON:  01/23/2019 FINDINGS: Moderate cardiac enlargement. Small to moderate  right pleural effusion noted. Asymmetric opacity within the right base is identified. Mild diffuse increased interstitial markings noted. IMPRESSION: 1. Small to moderate right pleural effusion. 2. Asymmetric opacity within the right base may represent atelectasis or infiltrate. 3. Suspect mild CHF. 4. Cardiac enlargement. Electronically Signed   By: Kerby Moors M.D.   On: 03/27/2019 09:06   Vas [place] Lower Extremity Venous (dvt)  Result Date: 03/28/2019  Lower Venous Study Indications: SOB.  Risk Factors: None identified. Limitations: Poor ultrasound/tissue interface. Comparison Study: No prior studies. Performing Technologist: Oliver Hum RVT  Examination Guidelines: A complete evaluation includes B-mode imaging, spectral Doppler, color Doppler, and power Doppler as needed of all accessible portions of each vessel. Bilateral testing is considered an integral part of a complete examination. Limited examinations for reoccurring indications may be performed as noted.  +---------+---------------+---------+-----------+----------+--------------+ RIGHT    CompressibilityPhasicitySpontaneityPropertiesThrombus Aging +---------+---------------+---------+-----------+----------+--------------+ CFV      Full           Yes      Yes                                 +---------+---------------+---------+-----------+----------+--------------+ SFJ      Full                                                        +---------+---------------+---------+-----------+----------+--------------+ FV Prox  Full                                                        +---------+---------------+---------+-----------+----------+--------------+ FV Mid   Full                                                        +---------+---------------+---------+-----------+----------+--------------+ FV DistalFull                                                         +---------+---------------+---------+-----------+----------+--------------+ PFV      Full                                                        +---------+---------------+---------+-----------+----------+--------------+ POP      Full  Yes      Yes                                 +---------+---------------+---------+-----------+----------+--------------+ PTV      Full                                                        +---------+---------------+---------+-----------+----------+--------------+ PERO     Full                                                        +---------+---------------+---------+-----------+----------+--------------+   +---------+---------------+---------+-----------+----------+--------------+ LEFT     CompressibilityPhasicitySpontaneityPropertiesThrombus Aging +---------+---------------+---------+-----------+----------+--------------+ CFV      Full           Yes      Yes                                 +---------+---------------+---------+-----------+----------+--------------+ SFJ      Full                                                        +---------+---------------+---------+-----------+----------+--------------+ FV Prox  Full                                                        +---------+---------------+---------+-----------+----------+--------------+ FV Mid   Full                                                        +---------+---------------+---------+-----------+----------+--------------+ FV DistalFull                                                        +---------+---------------+---------+-----------+----------+--------------+ PFV      Full                                                        +---------+---------------+---------+-----------+----------+--------------+ POP      Full           Yes      Yes                                  +---------+---------------+---------+-----------+----------+--------------+ PTV  Full                                                        +---------+---------------+---------+-----------+----------+--------------+ PERO     Full                                                        +---------+---------------+---------+-----------+----------+--------------+     Summary: Right: There is no evidence of deep vein thrombosis in the lower extremity. No cystic structure found in the popliteal fossa. Left: There is no evidence of deep vein thrombosis in the lower extremity. No cystic structure found in the popliteal fossa.  *See table(s) above for measurements and observations. Electronically signed by Servando Snare MD on 03/28/2019 at 3:29:15 PM.    Final     ASSESSMENT AND PLAN: 1.  CHF 2.  Iron deficiency anemia secondary to chronic GI blood loss 3.  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  CKD 5.  Right pleural effusion secondary to CHF  -The patient has been receiving weekly been of her 200 mg IV in our office.  She is scheduled for a dose today but is currently hospitalized.  The patient would like to proceed with her IV iron as planned.  Iron sucrose is not currently on formulary.  Discussed with pharmacist and will order Feraheme 510 mg IV x1 dose today.  The patient has received this before and has tolerated it well.  Adverse effects of been reviewed with the patient and she is agreeable to proceed. -Continue medical management of her other conditions per hospitalist.    LOS: 1 day   Mikey Bussing, DNP, AGPCNP-BC, AOCNP 03/28/19

## 2019-03-28 NOTE — Progress Notes (Signed)
  Echocardiogram 2D Echocardiogram has been performed.  Bethany [name] 03/28/2019, 3:31 PM

## 2019-03-29 ENCOUNTER — Telehealth: Payer: Self-pay

## 2019-03-29 ENCOUNTER — Telehealth: Payer: Self-pay | Admitting: *Deleted

## 2019-03-29 DIAGNOSIS — I5031 Acute diastolic (congestive) heart failure: Secondary | ICD-10-CM

## 2019-03-29 LAB — CBC
HCT: 32.8 % — ABNORMAL LOW (ref 36.0–46.0)
Hemoglobin: 9.1 g/dL — ABNORMAL LOW (ref 12.0–15.0)
MCH: 21 pg — ABNORMAL LOW (ref 26.0–34.0)
MCHC: 27.7 g/dL — ABNORMAL LOW (ref 30.0–36.0)
MCV: 75.6 fL — ABNORMAL LOW (ref 80.0–100.0)
Platelets: 322 10*3/uL (ref 150–400)
RBC: 4.34 MIL/uL (ref 3.87–5.11)
RDW: 23.6 % — ABNORMAL HIGH (ref 11.5–15.5)
WBC: 6.3 10*3/uL (ref 4.0–10.5)
nRBC: 0.3 % — ABNORMAL HIGH (ref 0.0–0.2)

## 2019-03-29 LAB — GLUCOSE, CAPILLARY
Glucose-Capillary: 121 mg/dL — ABNORMAL HIGH (ref 70–99)
Glucose-Capillary: 114 mg/dL — ABNORMAL HIGH (ref 70–99)
Glucose-Capillary: 133 mg/dL — ABNORMAL HIGH (ref 70–99)
Glucose-Capillary: 145 mg/dL — ABNORMAL HIGH (ref 70–99)

## 2019-03-29 LAB — BASIC METABOLIC PANEL
Anion gap: 12 (ref 5–15)
BUN: 15 mg/dL (ref 8–23)
CO2: 31 mmol/L (ref 22–32)
Calcium: 10.4 mg/dL — ABNORMAL HIGH (ref 8.9–10.3)
Chloride: 98 mmol/L (ref 98–111)
Creatinine, Ser: 1.13 mg/dL — ABNORMAL HIGH (ref 0.44–1.00)
GFR calc Af Amer: 57 mL/min — ABNORMAL LOW (ref >60)
GFR calc non Af Amer: 50 mL/min — ABNORMAL LOW (ref >60)
Glucose, Bld: 141 mg/dL — ABNORMAL HIGH (ref 70–99)
Potassium: 4 mmol/L (ref 3.5–5.1)
Sodium: 141 mmol/L (ref 135–145)

## 2019-03-29 LAB — PROCALCITONIN: Procalcitonin: <0.1 ng/mL

## 2019-03-29 MED ORDER — ENOXAPARIN SODIUM 40 MG/0.4ML ~~LOC~~ SOLN
40.0000 mg | SUBCUTANEOUS | Status: DC
  Administered 2019-03-29: 40 mg via SUBCUTANEOUS
  Filled 2019-03-29: qty 0.4

## 2019-03-29 MED ORDER — ALLOPURINOL 300 MG PO TABS
300.0000 mg | ORAL_TABLET | Freq: Every day | ORAL | Status: DC
  Administered 2019-03-29 – 2019-03-30 (×2): 300 mg via ORAL
  Filled 2019-03-29 (×2): qty 1

## 2019-03-29 NOTE — Care Management Important Message (Signed)
Important Message  Patient Details  Name: Bethany [name] MRN: [id number] Date of Birth: August 04, 1950   Medicare Important Message Given:  Yes     Orbie Pyo 03/29/2019, 2:23 PM

## 2019-03-29 NOTE — Progress Notes (Signed)
Pt states she takes allopurinol for gout at home; would like to know if she can continue taking it here during hospital stay

## 2019-03-29 NOTE — Telephone Encounter (Signed)
Called reporting she is inpatient at [facility] and will be there three more days. She is ready to have a port placed now and asking if it can be done while she is inpatient? Forwarded message to collaborative nurse.

## 2019-03-29 NOTE — Telephone Encounter (Signed)
Patient called asking if she could get a port placed while in the hospital.  Per Dr. Burr Medico I called patient back to let her know that it will have to be scheduled outpatient after she is discharged from the hospital.  She verbalized an understanding.

## 2019-03-29 NOTE — Progress Notes (Signed)
PROGRESS NOTE    Bethany [name]  [id number] DOB: 1951-03-26 DOA: 03/27/2019 PCP: Wenda Low, MD      Brief Narrative:  Mrs. Bethany [name] is a 68 y.o. F with HTN, DM, hypothyroidism, iron deficiency anemia, s/p nephrectomy for renal artery thrombosis, CKD 3a, depression, COPD not on home O2 and chronic pain who presented with 1 to 2 days of shortness of breath, not relieved with albuterol.  In the ER, patchy R>L opacity, right effusion, cardiomegaly.  90 9.65F, SPO2 87% on room air.  Skin negative for PE.        Assessment & Plan:  Acute hypoxic respiratory failure Cardiomegaly, elevated BNP, bilateral infiltrates suggest CHF.  VQ scan ruled out PE.  Covid negative.  Normal procalcitonin and WBC, absence of fever militated against infection.   Echo with grade III DD, responding now to diuresis   Right pleural effusion Acute diastolic CHF Patient admitted and started on diuretics.  Echocardiogram obtained, showed normal EF, grade 3 DD.  Net -2.9 L yesterday, creatinine improved.  Potassium stable.  Symptoms improving but still dyspneic with ambulation, still On 2L O2. -Continue Lasix 40 mg IV twice a day  -Hold a.m. Lasix, given her single kidney, will need daily dosing of Lasix -K supplement -Strict I/Os, daily weights, telemetry  -Daily monitoring renal function -Repeat CXR in 2 days to reevaluate pleural effusion    Hypercalcemia Mild, asymptomatic. 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 Burr Medico.  Hemoglobin 8.6, stable relative to baseline.  Discussed with Dr. Burr Medico -Hematology ordered [facility] today  Asthma/COPD No wheezing, doubt bronchospasm. -Albuterol as needed  Diabetes  Glucoses well controlled here -Continue Lantus -Continue sliding scale correction insulin -Continue atorvastatin -Hold home Trulicity  Hypothyroidism -Continue levothyroxine  Hypertension Blood pressure well controlled here -Continue amlodipine, hydralazine, metoprolol  Chronic pain -Continue Lyrica, gabapentin -Continue home oxycodone ER and IR  GERD -Continue pantoprazole  Gout No active flare -Resume allopurinol        MDM and disposition: The below labs and imaging reports reviewed and summarized above.  Medication management as above. Severe flare of the heart failure, still on supplemental oxygen.  The patient was admitted with acute hypoxic respiratory failure.  Infection and PE were ruled out.  This appears to be new onset acute diastolic CHF.  She is responding somewhat to diuresis but is still on oxygen.  Given her unilateral kidney, we need to be very cautious with diuresis.  I actually suspect she will need another day of IV Lasix tomorrow, but we will assess her kidney function and a repeat chest x-ray (to see her pleural effusion) tomorrow morning, and can decide about transitioning to oral Lasix or continuing IV Lasix after those are done in the morning.      DVT prophylaxis: Lovenox Code Status: DO NOT RESUSCITATE Family Communication:     Consultants:     Procedures:   11/12 US doppler legs -- NO DVT  11/11 VQ scan -- Low risk for PE  11/12 Echo -normal EF, grade 3 diastolic dysfunction  Antimicrobials:   Ceftriaxone x1 11/11  Azithromycin x1 11/11    Subjective: Still out of breath with exertion and with lying flat, but no leg swelling, no fever, no sputum, no  vomiting, no confusion.      Objective: Vitals:   03/28/19 2349 03/29/19 0500 03/29/19 0828 03/29/19 0914  BP: (!) 132/45  (!) 138/57   Pulse: (!) 57  (!) 56 65  Resp: 18  16   Temp: 98.4 F (36.9 C)  98.6 F (37 C)   TempSrc: Oral  Oral   SpO2: 97%  99%    Weight:  79.2 kg    Height:        Intake/Output Summary (Last 24 hours) at 03/29/2019 1010 Last data filed at 03/29/2019 0600 Gross per 24 hour  Intake 814 ml  Output 3750 ml  Net -2936 ml   Filed Weights   03/27/19 0825 03/28/19 0329 03/29/19 0500  Weight: 84.4 kg 81.1 kg 79.2 kg    Examination: General appearance: Well-nourished adult female, alert and in no acute distress.  Appears tired, sitting in bed. HEENT: Anicteric, conjunctiva pink, lids and lashes normal. No nasal deformity, discharge, epistaxis.  Lips moist, teeth normal. OP normal, no oral lesions.  Hearing normal. Skin: Warm and dry.  No suspicious rashes or lesions. Cardiac: RRR, no murmurs appreciated.  JVP elevated, trace LE edema.    Respiratory: On supplemental oxygen.  No wheezing, crackles at bilateral bases, improved from yesterday. Abdomen: Abdomen soft.  No TTP or guarding. No ascites, distension, hepatosplenomegaly.   MSK: No deformities or effusions of the large joints of the upper or lower extremities bilaterally. Neuro: Awake and alert. Naming is grossly intact, and the patient's recall, recent and remote, as well as general fund of knowledge seem within normal limits.  Muscle tone normal, without fasciculations.  Moves all extremities equally and with normal coordination.  Marland Kitchen Speech fluent.    Psych: Sensorium intact and responding to questions, attention normal. Affect normal.  Judgment and insight appear normal.     Data Reviewed: I have personally reviewed following labs and imaging studies:  CBC: Recent Labs  Lab 03/27/19 0907 03/27/19 2157 03/28/19 0351 03/29/19 0329  WBC 10.9* 11.3* 9.6 6.3  NEUTROABS 8.3*  --   --   --   HGB 9.1* 9.0* 8.6* 9.1*  HCT 33.0* 32.2* 31.4* 32.8*  MCV 77.1* 75.4* 76.2* 75.6*  PLT 346 306 312 [id number]   Basic Metabolic Panel: Recent Labs  Lab 03/27/19 0907 03/28/19 0351 03/29/19 0329  NA 136 139 141  K 4.7 4.4 4.0  CL 102 106 98  CO2 23 26 31   GLUCOSE  107* 115* 141*  BUN 16 16 15   CREATININE 1.40* 1.25* 1.13*  CALCIUM 9.7 9.7 10.4*   GFR: Estimated Creatinine Clearance: 48.9 mL/min (A) (by C-G formula based on SCr of 1.13 mg/dL (H)). Liver Function Tests: Recent Labs  Lab 03/27/19 0907 03/28/19 0351  AST 15 13*  ALT 11 9  ALKPHOS 150* 144*  BILITOT 0.6 0.5  PROT 6.5 6.1*  ALBUMIN 3.1* 2.7*   No results for input(s): LIPASE, AMYLASE in the last 168 hours. No results for input(s): AMMONIA in the last 168 hours. Coagulation Profile: No results for input(s): INR, PROTIME in the last 168 hours. Cardiac Enzymes: No results for input(s): CKTOTAL, CKMB, CKMBINDEX, TROPONINI in the last 168 hours. BNP (last 3 results) No results for input(s): PROBNP in the last 8760 hours. HbA1C: Recent Labs    03/27/19 2157  HGBA1C 5.4   CBG: Recent Labs  Lab 03/28/19 0735 03/28/19 1154 03/28/19 1636 03/28/19 2140 03/29/19 0826  GLUCAP 139* 171* 145* 174* 121*   Lipid Profile: Recent Labs  03/27/19 0907  TRIG 132   Thyroid Function Tests: Recent Labs    03/27/19 2157  TSH 1.126   Anemia Panel: Recent Labs    03/27/19 0907  FERRITIN 74   Urine analysis: No results found for: COLORURINE, APPEARANCEUR, LABSPEC, PHURINE, GLUCOSEU, HGBUR, BILIRUBINUR, KETONESUR, PROTEINUR, UROBILINOGEN, NITRITE, LEUKOCYTESUR Sepsis Labs: @LABRCNTIP (procalcitonin:4,lacticacidven:4)  ) Recent Results (from the past 240 hour(s))  Blood Culture (routine x 2)     Status: None (Preliminary result)   Collection Time: 03/27/19  9:42 AM   Specimen: BLOOD RIGHT HAND  Result Value Ref Range Status   Specimen Description BLOOD RIGHT HAND  Final   Special Requests   Final    BOTTLES DRAWN AEROBIC AND ANAEROBIC Blood Culture adequate volume   Culture   Final    NO GROWTH < 24 HOURS Performed at [facility], [place] [address], [place], [place] [address]    Report Status PENDING  Incomplete  Blood Culture (routine x 2)     Status: None  (Preliminary result)   Collection Time: 03/27/19  9:42 AM   Specimen: BLOOD LEFT HAND  Result Value Ref Range Status   Specimen Description BLOOD LEFT HAND  Final   Special Requests   Final    BOTTLES DRAWN AEROBIC AND ANAEROBIC Blood Culture results may not be optimal due to an inadequate volume of blood received in culture bottles   Culture   Final    NO GROWTH < 24 HOURS Performed at [facility], [place] [address], [place], [place] [address]    Report Status PENDING  Incomplete  SARS CORONAVIRUS 2 (TAT 6-24 HRS) Nasopharyngeal Nasopharyngeal Swab     Status: None   Collection Time: 03/27/19  1:57 PM   Specimen: Nasopharyngeal Swab  Result Value Ref Range Status   SARS Coronavirus 2 NEGATIVE NEGATIVE Final    Comment: (NOTE) SARS-CoV-2 target nucleic acids are NOT DETECTED. The SARS-CoV-2 RNA is generally detectable in upper and lower respiratory specimens during the acute phase of infection. Negative results do not preclude SARS-CoV-2 infection, do not rule out co-infections with other pathogens, and should not be used as the sole basis for treatment or other patient management decisions. Negative results must be combined with clinical observations, patient history, and epidemiological information. The expected result is Negative. Fact Sheet for Patients: SugarRoll.be Fact Sheet for Healthcare Providers: https://www.woods-mathews.com/ This test is not yet approved or cleared by the [place] FDA and  has been authorized for detection and/or diagnosis of SARS-CoV-2 by FDA under an Emergency Use Authorization (EUA). This EUA will remain  in effect (meaning this test can be used) for the duration of the COVID-19 declaration under Section 56 4(b)(1) of the Act, 21 U.S.C. section 360bbb-3(b)(1), unless the authorization is terminated or revoked sooner. Performed at [facility], [place] [address], [place], [place] [address]           Radiology Studies: Nm Pulmonary Perfusion  Result Date: 03/27/2019 CLINICAL DATA:  Respiratory failure.  COPD. EXAM: NUCLEAR MEDICINE PERFUSION LUNG SCAN TECHNIQUE: Perfusion images were obtained in multiple projections after intravenous injection of radiopharmaceutical. Ventilation scans intentionally deferred if perfusion scan and chest x-ray adequate for interpretation during COVID 19 epidemic. RADIOPHARMACEUTICALS:  1.5 mCi Tc-76m MAA IV COMPARISON:  Chest radiograph of earlier today. FINDINGS: No wedge-shaped perfusion defects to suggest pulmonary embolism. IMPRESSION: No evidence of pulmonary embolism. Electronically Signed   By: Abigail Miyamoto M.D.   On: 03/27/2019 18:39   Dg  Chest Port 1 View  Result Date: 03/28/2019 CLINICAL DATA:  Acute respiratory failure with hypoxia. EXAM: PORTABLE CHEST 1 VIEW COMPARISON:  March 27, 2019. FINDINGS: Stable cardiomediastinal silhouette. No pneumothorax is noted. Left lung is clear. Increased right basilar opacity is noted concerning for atelectasis or infiltrate. Small right pleural effusion may be present. Bony thorax is unremarkable. IMPRESSION: Increased right basilar opacity as described above. Electronically Signed   By: Marijo Conception M.D.   On: 03/28/2019 07:12   Vas [place] Lower Extremity Venous (dvt)  Result Date: 03/28/2019  Lower Venous Study Indications: SOB.  Risk Factors: None identified. Limitations: Poor ultrasound/tissue interface. Comparison Study: No prior studies. Performing Technologist: Oliver Hum RVT  Examination Guidelines: A complete evaluation includes B-mode imaging, spectral Doppler, color Doppler, and power Doppler as needed of all accessible portions of each vessel. Bilateral testing is considered an integral part of a complete examination. Limited examinations for reoccurring indications may be performed as noted.  +---------+---------------+---------+-----------+----------+--------------+ RIGHT     CompressibilityPhasicitySpontaneityPropertiesThrombus Aging +---------+---------------+---------+-----------+----------+--------------+ CFV      Full           Yes      Yes                                 +---------+---------------+---------+-----------+----------+--------------+ SFJ      Full                                                        +---------+---------------+---------+-----------+----------+--------------+ FV Prox  Full                                                        +---------+---------------+---------+-----------+----------+--------------+ FV Mid   Full                                                        +---------+---------------+---------+-----------+----------+--------------+ FV DistalFull                                                        +---------+---------------+---------+-----------+----------+--------------+ PFV      Full                                                        +---------+---------------+---------+-----------+----------+--------------+ POP      Full           Yes      Yes                                 +---------+---------------+---------+-----------+----------+--------------+ PTV  Full                                                        +---------+---------------+---------+-----------+----------+--------------+ PERO     Full                                                        +---------+---------------+---------+-----------+----------+--------------+   +---------+---------------+---------+-----------+----------+--------------+ LEFT     CompressibilityPhasicitySpontaneityPropertiesThrombus Aging +---------+---------------+---------+-----------+----------+--------------+ CFV      Full           Yes      Yes                                 +---------+---------------+---------+-----------+----------+--------------+ SFJ      Full                                                         +---------+---------------+---------+-----------+----------+--------------+ FV Prox  Full                                                        +---------+---------------+---------+-----------+----------+--------------+ FV Mid   Full                                                        +---------+---------------+---------+-----------+----------+--------------+ FV DistalFull                                                        +---------+---------------+---------+-----------+----------+--------------+ PFV      Full                                                        +---------+---------------+---------+-----------+----------+--------------+ POP      Full           Yes      Yes                                 +---------+---------------+---------+-----------+----------+--------------+ PTV      Full                                                        +---------+---------------+---------+-----------+----------+--------------+  PERO     Full                                                        +---------+---------------+---------+-----------+----------+--------------+     Summary: Right: There is no evidence of deep vein thrombosis in the lower extremity. No cystic structure found in the popliteal fossa. Left: There is no evidence of deep vein thrombosis in the lower extremity. No cystic structure found in the popliteal fossa.  *See table(s) above for measurements and observations. Electronically signed by Servando Snare MD on 03/28/2019 at 3:29:15 PM.    Final         Scheduled Meds: . amLODipine  5 mg Oral Daily  . atorvastatin  20 mg Oral Daily  . feeding supplement (ENSURE ENLIVE)  237 mL Oral BID BM  . furosemide  40 mg Intravenous BID  . gabapentin  600 mg Oral QID  . heparin  5,000 Units Subcutaneous Q8H  . hydrALAZINE  25 mg Oral TID  . insulin aspart  0-15 Units Subcutaneous TID WC  . insulin aspart  0-5 Units  Subcutaneous QHS  . insulin glargine  40 Units Subcutaneous QHS  . levothyroxine  75 mcg Oral Q0600  . mouth rinse  15 mL Mouth Rinse BID  . metoprolol tartrate  50 mg Oral BID  . oxyCODONE  30 mg Oral Q12H  . pantoprazole  40 mg Oral Daily  . pregabalin  25 mg Oral Daily   Continuous Infusions:    LOS: 2 days    Time spent: 25 minutes    Edwin Dada, MD [facility] 03/29/2019, 10:10 AM     Please page though [place] or Epic secure chat:  For [employer], Adult nurse

## 2019-03-30 ENCOUNTER — Inpatient Hospital Stay (HOSPITAL_COMMUNITY): Payer: Medicare Other

## 2019-03-30 DIAGNOSIS — I10 Essential (primary) hypertension: Secondary | ICD-10-CM

## 2019-03-30 DIAGNOSIS — I5033 Acute on chronic diastolic (congestive) heart failure: Secondary | ICD-10-CM

## 2019-03-30 DIAGNOSIS — I5032 Chronic diastolic (congestive) heart failure: Secondary | ICD-10-CM

## 2019-03-30 LAB — BASIC METABOLIC PANEL
Anion gap: 12 (ref 5–15)
BUN: 20 mg/dL (ref 8–23)
CO2: 32 mmol/L (ref 22–32)
Calcium: 10.3 mg/dL (ref 8.9–10.3)
Chloride: 94 mmol/L — ABNORMAL LOW (ref 98–111)
Creatinine, Ser: 1.32 mg/dL — ABNORMAL HIGH (ref 0.44–1.00)
GFR calc Af Amer: 48 mL/min — ABNORMAL LOW (ref >60)
GFR calc non Af Amer: 41 mL/min — ABNORMAL LOW (ref >60)
Glucose, Bld: 135 mg/dL — ABNORMAL HIGH (ref 70–99)
Potassium: 4 mmol/L (ref 3.5–5.1)
Sodium: 138 mmol/L (ref 135–145)

## 2019-03-30 LAB — CBC
HCT: 33 % — ABNORMAL LOW (ref 36.0–46.0)
Hemoglobin: 9.2 g/dL — ABNORMAL LOW (ref 12.0–15.0)
MCH: 21.1 pg — ABNORMAL LOW (ref 26.0–34.0)
MCHC: 27.9 g/dL — ABNORMAL LOW (ref 30.0–36.0)
MCV: 75.5 fL — ABNORMAL LOW (ref 80.0–100.0)
Platelets: 319 10*3/uL (ref 150–400)
RBC: 4.37 MIL/uL (ref 3.87–5.11)
RDW: 23.3 % — ABNORMAL HIGH (ref 11.5–15.5)
WBC: 5.9 10*3/uL (ref 4.0–10.5)
nRBC: 0.3 % — ABNORMAL HIGH (ref 0.0–0.2)

## 2019-03-30 LAB — GLUCOSE, CAPILLARY
Glucose-Capillary: 101 mg/dL — ABNORMAL HIGH (ref 70–99)
Glucose-Capillary: 108 mg/dL — ABNORMAL HIGH (ref 70–99)

## 2019-03-30 MED ORDER — ALLOPURINOL 300 MG PO TABS: 300.0000 mg | ORAL_TABLET | Freq: Every day | ORAL | 0 refills | Status: DC

## 2019-03-30 MED ORDER — SENNOSIDES-DOCUSATE SODIUM 8.6-50 MG PO TABS: 1.0000 | ORAL_TABLET | Freq: Every evening | ORAL | 0 refills | Status: AC | PRN

## 2019-03-30 MED ORDER — FUROSEMIDE 40 MG PO TABS: 40.0000 mg | ORAL_TABLET | Freq: Every day | ORAL | 11 refills | Status: DC

## 2019-03-30 MED ORDER — LIVING BETTER WITH HEART FAILURE BOOK
Freq: Once | Status: AC
  Administered 2019-03-30: 14:00:00

## 2019-03-30 MED ORDER — ENSURE ENLIVE PO LIQD: 237.0000 mL | Freq: Two times a day (BID) | ORAL | 12 refills | Status: DC

## 2019-03-30 MED ORDER — LIVING BETTER WITH HEART FAILURE BOOK: 1.0000 | Freq: Once | 0 refills | Status: AC

## 2019-03-30 NOTE — Discharge Summary (Signed)
Physician Discharge Summary  Patient ID: NIKOLA RENCK MRN: [id number] DOB/AGE: 69-Jan-1952 69 y.o.  Admit date: 03/27/2019 Discharge date: 03/30/2019  Admission Diagnoses:  Discharge Diagnoses:  Active Problems:   Iron deficiency anemia secondary to blood loss (chronic)   Chronic kidney disease (CKD), stage III (moderate)   Diabetes mellitus type 2 with neurological manifestations (HCC)   History of thrombosis   Acute respiratory failure with hypoxia (HCC)   Fever   Shortness of breath   Hypothyroidism   Chronic GI bleeding   Pleural effusion on right   Acute renal failure superimposed on stage 3b chronic kidney disease (HCC)   Essential hypertension   GERD (gastroesophageal reflux disease)   Acute respiratory failure ([place])   Discharged Condition: stable  Hospital Course:  Mrs. Menne is a 69 y.o. F with HTN, DM, hypothyroidism, iron deficiency anemia, s/p nephrectomy for renal artery thrombosis, CKD 3a, depression, COPD not on home O2 and chronic pain who presented with 1 to 2 days of shortness of breath, not relieved with albuterol.  In the ER, patchy R>L opacity, right effusion, cardiomegaly.  90 9.37F, SPO2 87% on room air.  Skin negative for PE.  Acute hypoxic respiratory failure Cardiomegaly, elevated BNP, bilateral infiltrates suggest CHF.  VQ scan ruled out PE.  Covid negative.  Normal procalcitonin and WBC, absence of fever militated against infection.   Echo with grade III DD, responding now to diuresis   Right pleural effusion Acute diastolic CHF Patient admitted and started on diuretics.  Echocardiogram obtained, showed normal EF, grade 3 DD.  Net -2.9 L yesterday, creatinine improved.  Potassium stable.  Symptoms improving but still dyspneic with ambulation, still On 2L O2. -Continue Lasix 40 mg IV twice a day  -Hold a.m. Lasix, given her single kidney, will need daily dosing of Lasix -K supplement -Strict I/Os, daily weights, telemetry  -Daily monitoring  renal function -Repeat CXR in 2 days to reevaluate pleural effusion    Hypercalcemia Mild, asymptomatic. 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 Burr Medico.  Hemoglobin 8.6, stable relative to baseline.  Discussed with Dr. Burr Medico -Hematology ordered [facility] today  Asthma/COPD No wheezing, doubt bronchospasm. -Albuterol as needed  Diabetes Glucoses well controlled here -Continue Lantus -Continue sliding scale correction insulin -Continue atorvastatin -Hold home Trulicity  Hypothyroidism -Continue levothyroxine  Hypertension Blood pressure well controlled here -Continue amlodipine, hydralazine, metoprolol  Chronic pain -Continue Lyrica, gabapentin -Continue home oxycodone ER and IR  GERD -Continue pantoprazole  Gout No active flare -Resume allopurinol  Consults: None  Significant Diagnostic Studies:   Discharge Exam: Blood pressure (!) 131/58, pulse 63, temperature 98.7 F (37.1 C), temperature source Oral, resp. rate 18, height 5\' 5"  (1.651 m), weight 75 kg, SpO2 93 %.  Disposition: Discharge disposition: 01-Home or Self Care   Discharge Instructions    Diet - low sodium heart healthy   Complete by: As directed    Diet Carb Modified   Complete by: As directed    Increase activity slowly   Complete by: As directed      Allergies as of 03/30/2019      Reactions   Contrast Media [iodinated Diagnostic Agents]    Renal hypertension per physician      Medication List  STOP taking these medications   magnesium 30 MG tablet     TAKE these medications   allopurinol 300 MG tablet Commonly known as: ZYLOPRIM Take 1 tablet (300 mg total) by mouth daily. Start  taking on: March 31, 2019   amLODipine 5 MG tablet Commonly known as: NORVASC Take 1 tablet (5 mg total) by mouth daily.   atorvastatin 20 MG tablet Commonly known as: LIPITOR Take 20 mg by mouth daily.   ergocalciferol 1.25 MG (50000 UT) capsule Commonly known as: VITAMIN D2 Take 50,000 Units by mouth once a week. Mondays   feeding supplement (ENSURE ENLIVE) Liqd Take 237 mLs by mouth 2 (two) times daily between meals.   furosemide 40 MG tablet Commonly known as: Lasix Take 1 tablet (40 mg total) by mouth daily.   gabapentin 600 MG tablet Commonly known as: NEURONTIN Take 600 mg by mouth 4 (four) times daily.   HumaLOG KwikPen 100 UNIT/ML KiwkPen Generic drug: insulin lispro Inject 24 Units into the skin daily with supper.   hydrALAZINE 25 MG tablet Commonly known as: APRESOLINE Take 25 mg by mouth 3 (three) times daily.   Ipratropium-Albuterol 20-100 MCG/ACT Aers respimat Commonly known as: COMBIVENT Inhale 1 puff into the lungs every 6 (six) hours as needed for wheezing or shortness of breath.   levothyroxine 75 MCG tablet Commonly known as: SYNTHROID Take 75 mcg by mouth daily.   Living Better with Heart Failure Book Misc 1 each by Does not apply route once for 1 dose.   metoprolol tartrate 50 MG tablet Commonly known as: LOPRESSOR Take 50 mg by mouth 2 (two) times daily.   oxyCODONE-acetaminophen 10-325 MG tablet Commonly known as: PERCOCET Take 1 tablet by mouth every 6 (six) hours as needed for pain.   pantoprazole 40 MG tablet Commonly known as: PROTONIX Take 40 mg by mouth daily.   pregabalin 25 MG capsule Commonly known as: LYRICA Take 25 mg by mouth daily.   senna-docusate 8.6-50 MG tablet Commonly known as: Senokot-S Take 1 tablet by mouth at bedtime as needed for mild constipation.   Toujeo SoloStar 300 UNIT/ML Sopn Generic drug: Insulin Glargine Inject 40 Units into the skin at bedtime.   Trulicity 1.5 0000000 Sopn Generic drug:  Dulaglutide Inject 1.5 mg into the skin once a week. Mondays   Xtampza ER 27 MG C12a Generic drug: oxyCODONE ER Take 1 capsule by mouth 2 (two) times daily.        SignedBonnell Public 03/30/2019, 11:29 AM

## 2019-03-30 NOTE — Progress Notes (Signed)
Patient states that she no longer wants to be a DNR but instead she wants to be a full code.

## 2019-04-01 LAB — CULTURE, BLOOD (ROUTINE X 2)
Culture: NO GROWTH
Culture: NO GROWTH
Special Requests: ADEQUATE

## 2019-04-03 ENCOUNTER — Other Ambulatory Visit: Payer: Self-pay

## 2019-04-03 ENCOUNTER — Other Ambulatory Visit: Payer: Medicare Other

## 2019-04-03 ENCOUNTER — Inpatient Hospital Stay: Payer: Medicare Other

## 2019-04-03 ENCOUNTER — Other Ambulatory Visit: Payer: Self-pay | Admitting: Hematology

## 2019-04-03 VITALS — BP 120/43 | HR 65 | Temp 98.9°F | Resp 22

## 2019-04-03 DIAGNOSIS — K5520 Angiodysplasia of colon without hemorrhage: Secondary | ICD-10-CM

## 2019-04-03 DIAGNOSIS — D500 Iron deficiency anemia secondary to blood loss (chronic): Secondary | ICD-10-CM | POA: Diagnosis present

## 2019-04-03 DIAGNOSIS — Q2733 Arteriovenous malformation of digestive system vessel: Secondary | ICD-10-CM | POA: Diagnosis not present

## 2019-04-03 MED ORDER — SODIUM CHLORIDE 0.9 % IV SOLN
200.0000 mg | Freq: Once | INTRAVENOUS | Status: AC
  Administered 2019-04-03: 200 mg via INTRAVENOUS
  Filled 2019-04-03: qty 10

## 2019-04-03 MED ORDER — SODIUM CHLORIDE 0.9 % IV SOLN
INTRAVENOUS | Status: DC
  Administered 2019-04-03: 16:00:00 via INTRAVENOUS
  Filled 2019-04-03: qty 250

## 2019-04-03 NOTE — Patient Instructions (Signed)

## 2019-04-04 ENCOUNTER — Telehealth: Payer: Self-pay | Admitting: Nurse Practitioner

## 2019-04-04 NOTE — Telephone Encounter (Signed)
Scheduled appt per 11/18 sch message - unable to reach pt . Left message with appt date and time

## 2019-04-18 ENCOUNTER — Telehealth: Payer: Self-pay | Admitting: *Deleted

## 2019-04-18 NOTE — Telephone Encounter (Signed)
A message was left, re: her new patient appointment. 

## 2019-04-18 NOTE — Progress Notes (Signed)
[place]   [phone] [phone]   Clinic Follow up Note   Patient Care Team: Wenda Low, MD as PCP - General (Internal Medicine) Truitt Merle, MD as Consulting Physician (Hematology) Angelia Mould, MD as Consulting Physician (Vascular Surgery) Koleen Distance, MD as Referring Physician (Gastroenterology)  Date of Service:  04/19/2019  CHIEF COMPLAINT:  F/u of Anemia  CURRENT THERAPY:  iv Feraheme as needed (if ferritin <100). Changed to IV Venofer on 01/02/19  INTERVAL HISTORY:  Bethany [name] is here for a follow up and anemia. She presents to the clinic alone. She notes her daughter found her unresponsive and was taken to ED. She was found she could not breath. Work up showed is was from PNA and heart failure. Her symptoms just came on suddenly. She notes she lives alone. She notes she feels better since discharge. She notes she continues to randomly getting sick or falling ill to something.  She has been taking stool softener and her BM are easy. She denies GI bleeding recently.     REVIEW OF SYSTEMS:   Constitutional: Denies fevers, chills or abnormal weight loss Eyes: Denies blurriness of vision Ears, nose, mouth, throat, and face: Denies mucositis or sore throat Respiratory: Denies cough, dyspnea or wheezes Cardiovascular: Denies palpitation, chest discomfort or lower extremity swelling Gastrointestinal:  Denies nausea, heartburn or change in bowel habits Skin: Denies abnormal skin rashes Lymphatics: Denies new lymphadenopathy or easy bruising Neurological:Denies numbness, tingling or new weaknesses Behavioral/Psych: Mood is stable, no new changes  All other systems were reviewed with the patient and are negative.  MEDICAL HISTORY:  Past Medical History:  Diagnosis Date  . Acute renal failure ([place])   . Arthritis   . Diabetes mellitus without complication ([place])    type II   . Hypercalcemia   . Hypertension   . Renal  disorder   . Single kidney   . Thrombosis     SURGICAL HISTORY: Past Surgical History:  Procedure Laterality Date  . ABDOMINAL HYSTERECTOMY    . blood clots removed from descending aorta     . CHOLECYSTECTOMY    . COLONOSCOPY    . COLONOSCOPY WITH PROPOFOL N/A 04/17/2017   Procedure: COLONOSCOPY WITH PROPOFOL;  Surgeon: Wilford Corner, MD;  Location: [facility];  Service: Endoscopy;  Laterality: N/A;  . ESOPHAGOGASTRODUODENOSCOPY (EGD) WITH PROPOFOL N/A 04/17/2017   Procedure: ESOPHAGOGASTRODUODENOSCOPY (EGD) WITH PROPOFOL;  Surgeon: Wilford Corner, MD;  Location: [facility];  Service: Endoscopy;  Laterality: N/A;  . NEPHRECTOMY RECIPIENT      I have reviewed the social history and family history with the patient and they are unchanged from previous note.  ALLERGIES:  is allergic to contrast media [iodinated diagnostic agents].  MEDICATIONS:  Current Outpatient Medications  Medication Sig Dispense Refill  . allopurinol (ZYLOPRIM) 300 MG tablet Take 1 tablet (300 mg total) by mouth daily. 30 tablet 0  . amLODipine (NORVASC) 5 MG tablet Take 1 tablet (5 mg total) by mouth daily. 30 tablet 0  . atorvastatin (LIPITOR) 20 MG tablet Take 20 mg by mouth daily.    . Dulaglutide (TRULICITY) 1.5 0000000 SOPN Inject 1.5 mg into the skin once a week. Mondays    . ergocalciferol (VITAMIN D2) 1.25 MG (50000 UT) capsule Take 50,000 Units by mouth once a week. Mondays    . feeding supplement, ENSURE ENLIVE, (ENSURE ENLIVE) LIQD Take 237 mLs by mouth 2 (two) times daily between meals. 237 mL 12  . furosemide (LASIX)  40 MG tablet Take 1 tablet (40 mg total) by mouth daily. 30 tablet 11  . gabapentin (NEURONTIN) 600 MG tablet Take 600 mg by mouth 4 (four) times daily.     . hydrALAZINE (APRESOLINE) 25 MG tablet Take 25 mg by mouth 3 (three) times daily.    . Insulin Glargine (TOUJEO SOLOSTAR) 300 UNIT/ML SOPN Inject 40 Units into the skin at bedtime.     . insulin lispro (HUMALOG KWIKPEN)  100 UNIT/ML KiwkPen Inject 24 Units into the skin daily with supper.     . Ipratropium-Albuterol (COMBIVENT) 20-100 MCG/ACT AERS respimat Inhale 1 puff into the lungs every 6 (six) hours as needed for wheezing or shortness of breath. 1 Inhaler 0  . levothyroxine (SYNTHROID) 75 MCG tablet Take 75 mcg by mouth daily.    . metoprolol (LOPRESSOR) 50 MG tablet Take 50 mg by mouth 2 (two) times daily.     Marland Kitchen oxyCODONE-acetaminophen (PERCOCET) 10-325 MG tablet Take 1 tablet by mouth every 6 (six) hours as needed for pain.    . pantoprazole (PROTONIX) 40 MG tablet Take 40 mg by mouth daily.    . pregabalin (LYRICA) 25 MG capsule Take 25 mg by mouth daily.    Marland Kitchen senna-docusate (SENOKOT-S) 8.6-50 MG tablet Take 1 tablet by mouth at bedtime as needed for mild constipation. 30 tablet 0  . XTAMPZA ER 27 MG C12A Take 1 capsule by mouth 2 (two) times daily.     No current facility-administered medications for this visit.     PHYSICAL EXAMINATION: ECOG PERFORMANCE STATUS: 1 - Symptomatic but completely ambulatory  Vitals:   04/19/19 1059  BP: (!) 157/58  Pulse: 74  Resp: 20  Temp: 97.9 F (36.6 C)  SpO2: 98%   Filed Weights   04/19/19 1059  Weight: 172 lb 14.4 oz (78.4 kg)    GENERAL:alert, no distress and comfortable SKIN: skin color, texture, turgor are normal, no rashes or significant lesions EYES: normal, Conjunctiva are pink and non-injected, sclera clear  NECK: supple, thyroid normal size, non-tender, without nodularity LYMPH:  no palpable lymphadenopathy in the cervical, axillary  LUNGS: clear to auscultation and percussion with normal breathing effort HEART: regular rate & rhythm and no murmurs and no lower extremity edema ABDOMEN:abdomen soft, non-tender and normal bowel sounds Musculoskeletal:no cyanosis of digits and no clubbing  NEURO: alert & oriented x 3 with fluent speech, no focal motor/sensory deficits  LABORATORY DATA:  I have reviewed the data as listed CBC Latest Ref Rng  & Units 04/19/2019 03/30/2019 03/29/2019  WBC 4.0 - 10.5 K/uL 6.1 5.9 6.3  Hemoglobin 12.0 - 15.0 g/dL 11.5(L) 9.2(L) 9.1(L)  Hematocrit 36.0 - 46.0 % 40.6 33.0(L) 32.8(L)  Platelets 150 - 400 K/uL 324 319 322     CMP Latest Ref Rng & Units 03/30/2019 03/29/2019 03/28/2019  Glucose 70 - 99 mg/dL 135(H) 141(H) 115(H)  BUN 8 - 23 mg/dL 20 15 16   Creatinine 0.44 - 1.00 mg/dL 1.32(H) 1.13(H) 1.25(H)  Sodium 135 - 145 mmol/L 138 141 139  Potassium 3.5 - 5.1 mmol/L 4.0 4.0 4.4  Chloride 98 - 111 mmol/L 94(L) 98 106  CO2 22 - 32 mmol/L 32 31 26  Calcium 8.9 - 10.3 mg/dL 10.3 10.4(H) 9.7  Total Protein 6.5 - 8.1 g/dL - - 6.1(L)  Total Bilirubin 0.3 - 1.2 mg/dL - - 0.5  Alkaline Phos 38 - 126 U/L - - 144(H)  AST 15 - 41 U/L - - 13(L)  ALT 0 - 44 U/L - -  9      RADIOGRAPHIC STUDIES: I have personally reviewed the radiological images as listed and agreed with the findings in the report. No results found.   ASSESSMENT & PLAN:  ANALIZE LIZ is a 68 y.o. female with   1. 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 She has required blood transfusions in the past in 2013 and in 2018 and frequent hospital admission for anemia. -She has intermittent epistaxis, but no skin or mucosa telangiectasia, no significant family history of bleeding disorder, she does not meet the diagnosis criteria for hereditary hemorrhagic telangiectasia (HHT). -Her last colonoscopy/endoscopy was 04/2017.  -She is not on oral iron, due to the lack of benefit. -Currently being treated withIV Iron to prevent significant anemiawith goal of Ferritin 100-200 range. She was switched to IV Venofer on 01/02/19 due to her insurance coverage. Last dose 04/03/19.  She was recently hospitalized for congestive heart failure, and received 1 dose of Feraheme in the hospital, anemia  significantly improved. -She is clinically doing better from a anemia standpoint. Labs reviewed, Hg improved to 11.5. Iron panel still pending. I discussed increasing frequency of Venofer weekly for 2-4 weeks if her iron level is low. Will continue to monitor with monthly labs. -She is considering PAC placement with increased frequency of iron and poor IV access. I reviewed risks of PAC with her.  She decided to proceed -Will proceed with 1 dose IV Venofer today  -Given her frequent need for IV iron, I will arrange IV monthly (unless ferritin>300 on last lab) -F/u in 3 months   2. Chronic GI Bleeding, intermittent epistaxis -Has intestinal AVM history  -Last colonoscopy/EGD by Dr. Michail Sermon on 04/17/17 showed gastritis, diverticulosis and internal hemorrhoids, all benign with no obvious signs of bleeding at that time. -She does not meet the diagnosis criteria for hereditary hemorrhagic telangiectasia (HHT) -She denies recent GI bleeding. Continue to monitor as she may need IV iron.  -I previously recommend she follow up with her GI Dr. Alan Ripper for upper endoscopy/colonoscopy   3. H/o of arterial thrombosis -S/p unilateral nephrectomy due to damage from thrombosis -Managed by vascular physician, Dr. Doren Custard  -patient does not currently receive therapeutic anticoagulation due to history of severe GI bleeding. -Previous work-up for lupus anticoagulantand antiphospholipid syndromewerenegative, normal protein  S and C level.  4. Smoking Cessation  -Currently smokes 1.5packs over 2 days currently. She has been smoking for 40 years. -Irepeatedly encouraged her to continue to try to quit completely, she understand and is willing to try.   5. COPD, HTN, CKD stage III -Only has 1 kidney. Will check her kidney function every 6 months -f/u with PCP  6. DM with Peripheral Neuropathy  -She notes her neuropathy has gotten worse and has been experienced upper left leg pain from this.  -She  is currently on 600mg  Gabapentin 4 times a day.   7. Chronic Back Pain  -She is being [facility] by [facility]  -She is currently doing PT  8. Acid Reflux, Gas  -Has been significant for the past month.  -She has been on Protonix for 10 years but not helping lately.  -I encouraged her to f/u with her GI about changing medication. She may need Upper Endoscopy soon   PLAN: -IV Venofer today  -Lab and IV Venofer monthly X3 (hold Venofer if last ferritin>300) -  IR port placement  -F/u in 3 months     No problem-specific Assessment & Plan notes found for this encounter.   Orders Placed This Encounter  Procedures  . IR IMAGING GUIDED PORT INSERTION    Standing Status:   Future    Standing Expiration Date:   06/20/2020    Order Specific Question:   Reason for Exam (SYMPTOM  OR DIAGNOSIS REQUIRED)    Answer:   iv access    Order Specific Question:   Preferred Imaging Location?    Answer:   [facility]   All questions were answered. The patient knows to call the clinic with any problems, questions or concerns. No barriers to learning was detected. I spent 15 minutes counseling the patient face to face. The total time spent in the appointment was 20 minutes and more than 50% was on counseling and review of test results     Truitt Merle, MD 04/19/2019   I, Joslyn Devon, am acting as scribe for Truitt Merle, MD.   I have reviewed the above documentation for accuracy and completeness, and I agree with the above.

## 2019-04-19 ENCOUNTER — Inpatient Hospital Stay: Payer: Medicare Other | Attending: Hematology

## 2019-04-19 ENCOUNTER — Encounter: Payer: Self-pay | Admitting: Hematology

## 2019-04-19 ENCOUNTER — Inpatient Hospital Stay (HOSPITAL_BASED_OUTPATIENT_CLINIC_OR_DEPARTMENT_OTHER): Payer: Medicare Other | Admitting: Hematology

## 2019-04-19 ENCOUNTER — Telehealth: Payer: Self-pay | Admitting: Hematology

## 2019-04-19 ENCOUNTER — Inpatient Hospital Stay: Payer: Medicare Other

## 2019-04-19 ENCOUNTER — Other Ambulatory Visit: Payer: Self-pay

## 2019-04-19 VITALS — BP 157/58 | HR 74 | Temp 97.9°F | Resp 20 | Ht 65.0 in | Wt 172.9 lb

## 2019-04-19 DIAGNOSIS — E1142 Type 2 diabetes mellitus with diabetic polyneuropathy: Secondary | ICD-10-CM | POA: Insufficient documentation

## 2019-04-19 DIAGNOSIS — R040 Epistaxis: Secondary | ICD-10-CM | POA: Insufficient documentation

## 2019-04-19 DIAGNOSIS — N183 Chronic kidney disease, stage 3 unspecified: Secondary | ICD-10-CM | POA: Insufficient documentation

## 2019-04-19 DIAGNOSIS — E1122 Type 2 diabetes mellitus with diabetic chronic kidney disease: Secondary | ICD-10-CM | POA: Insufficient documentation

## 2019-04-19 DIAGNOSIS — M549 Dorsalgia, unspecified: Secondary | ICD-10-CM | POA: Diagnosis not present

## 2019-04-19 DIAGNOSIS — K219 Gastro-esophageal reflux disease without esophagitis: Secondary | ICD-10-CM | POA: Diagnosis not present

## 2019-04-19 DIAGNOSIS — Q2733 Arteriovenous malformation of digestive system vessel: Secondary | ICD-10-CM | POA: Diagnosis not present

## 2019-04-19 DIAGNOSIS — D500 Iron deficiency anemia secondary to blood loss (chronic): Secondary | ICD-10-CM

## 2019-04-19 DIAGNOSIS — I130 Hypertensive heart and chronic kidney disease with heart failure and stage 1 through stage 4 chronic kidney disease, or unspecified chronic kidney disease: Secondary | ICD-10-CM | POA: Insufficient documentation

## 2019-04-19 DIAGNOSIS — G8929 Other chronic pain: Secondary | ICD-10-CM | POA: Diagnosis not present

## 2019-04-19 DIAGNOSIS — J449 Chronic obstructive pulmonary disease, unspecified: Secondary | ICD-10-CM | POA: Diagnosis not present

## 2019-04-19 LAB — CBC WITH DIFFERENTIAL (CANCER CENTER ONLY)
Abs Immature Granulocytes: 0 10*3/uL (ref 0.00–0.07)
Basophils Absolute: 0 10*3/uL (ref 0.0–0.1)
Basophils Relative: 0 %
Eosinophils Absolute: 0.2 10*3/uL (ref 0.0–0.5)
Eosinophils Relative: 3 %
HCT: 40.6 % (ref 36.0–46.0)
Hemoglobin: 11.5 g/dL — ABNORMAL LOW (ref 12.0–15.0)
Lymphocytes Relative: 37 %
Lymphs Abs: 2.3 10*3/uL (ref 0.7–4.0)
MCH: 22 pg — ABNORMAL LOW (ref 26.0–34.0)
MCHC: 28.3 g/dL — ABNORMAL LOW (ref 30.0–36.0)
MCV: 77.6 fL — ABNORMAL LOW (ref 80.0–100.0)
Monocytes Absolute: 0.4 10*3/uL (ref 0.1–1.0)
Monocytes Relative: 6 %
Neutro Abs: 3.3 10*3/uL (ref 1.7–7.7)
Neutrophils Relative %: 54 %
Platelet Count: 324 10*3/uL (ref 150–400)
RBC: 5.23 MIL/uL — ABNORMAL HIGH (ref 3.87–5.11)
RDW: 23.9 % — ABNORMAL HIGH (ref 11.5–15.5)
WBC Count: 6.1 10*3/uL (ref 4.0–10.5)
nRBC: 0 % (ref 0.0–0.2)

## 2019-04-19 LAB — IRON AND TIBC
Iron: 48 ug/dL (ref 28–170)
Saturation Ratios: 14 % (ref 10.4–31.8)
TIBC: 353 ug/dL (ref 250–450)
UIBC: 305 ug/dL

## 2019-04-19 LAB — FERRITIN: Ferritin: 90 ng/mL (ref 11–307)

## 2019-04-19 NOTE — Progress Notes (Signed)
Unable to get IV access.  Pt states she is unable to wait for IV team. Per Dr Burr Medico OK for pt to leave and reschedule for iron infusion one day next week.

## 2019-04-19 NOTE — Telephone Encounter (Signed)
Left message re 12/11 visit. Schedule mailed.

## 2019-04-20 ENCOUNTER — Encounter: Payer: Self-pay | Admitting: Hematology

## 2019-04-22 ENCOUNTER — Telehealth: Payer: Self-pay | Admitting: Hematology

## 2019-04-22 NOTE — Telephone Encounter (Signed)
Scheduled appt per 12/4 los.  Spoke with pt and she is aware of hr appt date and time.

## 2019-04-23 ENCOUNTER — Telehealth: Payer: Self-pay | Admitting: Hematology

## 2019-04-23 NOTE — Telephone Encounter (Signed)
R/s appt per 12/8 sch message - pt aware of new appt date and time

## 2019-04-26 ENCOUNTER — Ambulatory Visit: Payer: Medicare Other

## 2019-04-30 ENCOUNTER — Other Ambulatory Visit: Payer: Self-pay | Admitting: Physician Assistant

## 2019-05-01 ENCOUNTER — Ambulatory Visit (HOSPITAL_COMMUNITY)
Admission: RE | Admit: 2019-05-01 | Discharge: 2019-05-01 | Disposition: A | Discharge status: Home / Self Care | Payer: Medicare Other | Source: Ambulatory Visit

## 2019-05-01 ENCOUNTER — Encounter (HOSPITAL_COMMUNITY): Payer: Self-pay

## 2019-05-01 ENCOUNTER — Ambulatory Visit (HOSPITAL_COMMUNITY)
Admission: RE | Admit: 2019-05-01 | Discharge: 2019-05-01 | Disposition: A | Discharge status: Home / Self Care | Payer: Medicare Other | Source: Ambulatory Visit | Attending: Hematology | Admitting: Hematology

## 2019-05-01 ENCOUNTER — Other Ambulatory Visit: Payer: Self-pay

## 2019-05-01 DIAGNOSIS — Z79890 Hormone replacement therapy (postmenopausal): Secondary | ICD-10-CM | POA: Diagnosis not present

## 2019-05-01 DIAGNOSIS — D500 Iron deficiency anemia secondary to blood loss (chronic): Secondary | ICD-10-CM | POA: Insufficient documentation

## 2019-05-01 DIAGNOSIS — E119 Type 2 diabetes mellitus without complications: Secondary | ICD-10-CM | POA: Diagnosis not present

## 2019-05-01 DIAGNOSIS — M1990 Unspecified osteoarthritis, unspecified site: Secondary | ICD-10-CM | POA: Insufficient documentation

## 2019-05-01 DIAGNOSIS — Z794 Long term (current) use of insulin: Secondary | ICD-10-CM | POA: Insufficient documentation

## 2019-05-01 DIAGNOSIS — I10 Essential (primary) hypertension: Secondary | ICD-10-CM | POA: Insufficient documentation

## 2019-05-01 DIAGNOSIS — F17210 Nicotine dependence, cigarettes, uncomplicated: Secondary | ICD-10-CM | POA: Diagnosis not present

## 2019-05-01 DIAGNOSIS — Z79899 Other long term (current) drug therapy: Secondary | ICD-10-CM | POA: Diagnosis not present

## 2019-05-01 HISTORY — PX: IR IMAGING GUIDED PORT INSERTION: IMG5740

## 2019-05-01 LAB — CBC
HCT: 41.1 % (ref 36.0–46.0)
Hemoglobin: 11.8 g/dL — ABNORMAL LOW (ref 12.0–15.0)
MCH: 22.5 pg — ABNORMAL LOW (ref 26.0–34.0)
MCHC: 28.7 g/dL — ABNORMAL LOW (ref 30.0–36.0)
MCV: 78.3 fL — ABNORMAL LOW (ref 80.0–100.0)
Platelets: 267 10*3/uL (ref 150–400)
RBC: 5.25 MIL/uL — ABNORMAL HIGH (ref 3.87–5.11)
RDW: 22 % — ABNORMAL HIGH (ref 11.5–15.5)
WBC: 8.3 10*3/uL (ref 4.0–10.5)
nRBC: 0 % (ref 0.0–0.2)

## 2019-05-01 LAB — PROTIME-INR
INR: 0.8 (ref 0.8–1.2)
Prothrombin Time: 11.1 seconds — ABNORMAL LOW (ref 11.4–15.2)

## 2019-05-01 LAB — GLUCOSE, CAPILLARY: Glucose-Capillary: 101 mg/dL — ABNORMAL HIGH (ref 70–99)

## 2019-05-01 MED ORDER — LIDOCAINE-EPINEPHRINE (PF) 1 %-1:200000 IJ SOLN
INTRAMUSCULAR | Status: DC | PRN
  Administered 2019-05-01: 10 mL

## 2019-05-01 MED ORDER — LIDOCAINE-EPINEPHRINE (PF) 1 %-1:200000 IJ SOLN
INTRAMUSCULAR | Status: DC | PRN
  Administered 2019-05-01: 5 mL

## 2019-05-01 MED ORDER — FENTANYL CITRATE (PF) 100 MCG/2ML IJ SOLN
INTRAMUSCULAR | Status: DC | PRN
  Administered 2019-05-01 (×2): 50 ug via INTRAVENOUS

## 2019-05-01 MED ORDER — HEPARIN SOD (PORK) LOCK FLUSH 100 UNIT/ML IV SOLN
INTRAVENOUS | Status: DC | PRN
  Administered 2019-05-01: 500 [IU] via INTRAVENOUS

## 2019-05-01 MED ORDER — FENTANYL CITRATE (PF) 100 MCG/2ML IJ SOLN
INTRAMUSCULAR | Status: AC
  Filled 2019-05-01: qty 2

## 2019-05-01 MED ORDER — HEPARIN SOD (PORK) LOCK FLUSH 100 UNIT/ML IV SOLN
INTRAVENOUS | Status: AC
  Filled 2019-05-01: qty 5

## 2019-05-01 MED ORDER — MIDAZOLAM HCL 2 MG/2ML IJ SOLN
INTRAMUSCULAR | Status: DC | PRN
  Administered 2019-05-01 (×4): 1 mg via INTRAVENOUS

## 2019-05-01 MED ORDER — MIDAZOLAM HCL 2 MG/2ML IJ SOLN
INTRAMUSCULAR | Status: AC
  Filled 2019-05-01: qty 4

## 2019-05-01 MED ORDER — CEFAZOLIN SODIUM-DEXTROSE 2-4 GM/100ML-% IV SOLN
INTRAVENOUS | Status: AC
  Administered 2019-05-01: 2 g via INTRAVENOUS
  Filled 2019-05-01: qty 100

## 2019-05-01 MED ORDER — LIDOCAINE HCL 1 % IJ SOLN
INTRAMUSCULAR | Status: AC
  Filled 2019-05-01: qty 20

## 2019-05-01 MED ORDER — LIDOCAINE-EPINEPHRINE 1 %-1:100000 IJ SOLN
INTRAMUSCULAR | Status: AC
  Filled 2019-05-01: qty 1

## 2019-05-01 MED ORDER — SODIUM CHLORIDE 0.9 % IV SOLN: INTRAVENOUS | Status: DC

## 2019-05-01 MED ORDER — CEFAZOLIN SODIUM-DEXTROSE 2-4 GM/100ML-% IV SOLN: 2.0000 g | Freq: Once | INTRAVENOUS | Status: AC

## 2019-05-01 NOTE — Discharge Instructions (Signed)
Do not use EMLA cream on the skin glue of your new port until your port has healed. The petroleum in the EMLA cream will dissolve the skin glue. The skin over your new port will come apart resulting in an infection of your new port. Use ice in a zip lock bag for 2-3 minutes over your new port before the nurses access your new port.   Implanted Port Insertion, Care After This sheet gives you information about how to care for yourself after your procedure. Your health care provider may also give you more specific instructions. If you have problems or questions, contact your health care provider. What can I expect after the procedure? After the procedure, it is common to have:  Discomfort at the port insertion site.  Bruising on the skin over the port. This should improve over 3-4 days. Follow these instructions at home: [facility] care  After your port is placed, you will get a manufacturer's information card. The card has information about your port. Keep this card with you at all times.  Take care of the port as told by your health care provider. Ask your health care provider if you or a family member can get training for taking care of the port at home. A home health care nurse may also take care of the port.  Make sure to remember what type of port you have. Incision care      Follow instructions from your health care provider about how to take care of your port insertion site. Make sure you: ? Wash your hands with soap and water before and after you change your bandage (dressing). If soap and water are not available, use hand sanitizer. ? Change your dressing as told by your health care provider.  Leave  skin glue in place. These skin closures may need to stay in place for 2 weeks or longer.  Check your port insertion site every day for signs of infection. Check for: ? Redness, swelling, or pain. ? Fluid or blood. ? Warmth. ? Pus or a bad smell. Activity  Return to your normal  activities as told by your health care provider. Ask your health care provider what activities are safe for you.  Do not lift anything that is heavier than 10 lb (4.5 kg), or the limit that you are told, until your health care provider says that it is safe. General instructions  Take over-the-counter and prescription medicines only as told by your health care provider.  Do not take baths, swim, or use a hot tub until your health care provider approves. Ask your health care provider if you may take showers. You may only be allowed to take sponge baths.  Do not drive for 24 hours if you were given a sedative during your procedure.  Wear a medical alert bracelet in case of an emergency. This will tell any health care providers that you have a port.  Keep all follow-up visits as told by your health care provider. This is important. Contact a health care provider if:  You cannot flush your port with saline as directed, or you cannot draw blood from the port.  You have a fever or chills.  You have redness, swelling, or pain around your port insertion site.  You have fluid or blood coming from your port insertion site.  Your port insertion site feels warm to the touch.  You have pus or a bad smell coming from the port insertion site. Get help right away  if:  You have chest pain or shortness of breath.  You have bleeding from your port that you cannot control. Summary  Take care of the port as told by your health care provider. Keep the manufacturer's information card with you at all times.  Change your dressing as told by your health care provider.  Contact a health care provider if you have a fever or chills or if you have redness, swelling, or pain around your port insertion site.  Keep all follow-up visits as told by your health care provider. This information is not intended to replace advice given to you by your health care provider. Make sure you discuss any questions you have  with your health care provider. Document Released: 02/20/2013 Document Revised: 11/28/2017 Document Reviewed: 11/28/2017 Elsevier Patient Education  [place].       Moderate Conscious Sedation, Adult, Care After These instructions provide you with information about caring for yourself after your procedure. Your health care provider may also give you more specific instructions. Your treatment has been planned according to current medical practices, but problems sometimes occur. Call your health care provider if you have any problems or questions after your procedure. What can I expect after the procedure? After your procedure, it is common:  To feel sleepy for several hours.  To feel clumsy and have poor balance for several hours.  To have poor judgment for several hours.  To vomit if you eat too soon. Follow these instructions at home: For at least 24 hours after the procedure:   Do not: ? Participate in activities where you could fall or become injured. ? Drive. ? Use heavy machinery. ? Drink alcohol. ? Take sleeping pills or medicines that cause drowsiness. ? Make important decisions or sign legal documents. ? Take care of children on your own.  Rest. Eating and drinking  Follow the diet recommended by your health care provider.  If you vomit: ? Drink water, juice, or soup when you can drink without vomiting. ? Make sure you have little or no nausea before eating solid foods. General instructions  Have a responsible adult stay with you until you are awake and alert.  Take over-the-counter and prescription medicines only as told by your health care provider.  If you smoke, do not smoke without supervision.  Keep all follow-up visits as told by your health care provider. This is important. Contact a health care provider if:  You keep feeling nauseous or you keep vomiting.  You feel light-headed.  You develop a rash.  You have a fever. Get help  right away if:  You have trouble breathing. This information is not intended to replace advice given to you by your health care provider. Make sure you discuss any questions you have with your health care provider. Document Released: 02/20/2013 Document Revised: 04/14/2017 Document Reviewed: 08/22/2015 Elsevier Patient Education  2020 [employer].

## 2019-05-01 NOTE — H&P (Signed)
Chief Complaint: Poor venous access  Referring Physician(s): Feng,Yan  Supervising Physician: Arne Cleveland  Patient Status: [facility] - Out-pt  History of Present Illness: Bethany [name] is a 68 y.o. female with iron deficiency anemia secondary to chronic blood loss from a GI AVM.   She is currently treated with IV Iron monthly and her venous access is becoming poor.  She is here today for placement of a Port A Cath.  No nausea/vomiting. No Fever/chills. ROS negative.   Past Medical History:  Diagnosis Date  . Acute renal failure ([place])   . Arthritis   . Diabetes mellitus without complication ([place])    type II   . Hypercalcemia   . Hypertension   . Renal disorder   . Single kidney   . Thrombosis     Past Surgical History:  Procedure Laterality Date  . ABDOMINAL HYSTERECTOMY    . blood clots removed from descending aorta     . CHOLECYSTECTOMY    . COLONOSCOPY    . COLONOSCOPY WITH PROPOFOL N/A 04/17/2017   Procedure: COLONOSCOPY WITH PROPOFOL;  Surgeon: Wilford Corner, MD;  Location: [facility];  Service: Endoscopy;  Laterality: N/A;  . ESOPHAGOGASTRODUODENOSCOPY (EGD) WITH PROPOFOL N/A 04/17/2017   Procedure: ESOPHAGOGASTRODUODENOSCOPY (EGD) WITH PROPOFOL;  Surgeon: Wilford Corner, MD;  Location: [facility];  Service: Endoscopy;  Laterality: N/A;  . NEPHRECTOMY RECIPIENT      Allergies: Contrast media [iodinated diagnostic agents]  Medications: Prior to Admission medications   Medication Sig Start Date End Date Taking? Authorizing Provider  allopurinol (ZYLOPRIM) 300 MG tablet Take 1 tablet (300 mg total) by mouth daily. 03/31/19   Dana Allan I, MD  amLODipine (NORVASC) 5 MG tablet Take 1 tablet (5 mg total) by mouth daily. 03/18/17   Hongalgi, Lenis Dickinson, MD  atorvastatin (LIPITOR) 20 MG tablet Take 20 mg by mouth daily.    [provider]  Dulaglutide (TRULICITY) 1.5 0000000 SOPN Inject 1.5 mg into the skin once a week. Mondays     [provider]  ergocalciferol (VITAMIN D2) 1.25 MG (50000 UT) capsule Take 50,000 Units by mouth once a week. Mondays    [provider]  feeding supplement, ENSURE ENLIVE, (ENSURE ENLIVE) LIQD Take 237 mLs by mouth 2 (two) times daily between meals. 03/30/19   Bonnell Public, MD  furosemide (LASIX) 40 MG tablet Take 1 tablet (40 mg total) by mouth daily. 03/30/19 03/29/20  Dana Allan I, MD  gabapentin (NEURONTIN) 600 MG tablet Take 600 mg by mouth 4 (four) times daily.     [provider]  hydrALAZINE (APRESOLINE) 25 MG tablet Take 25 mg by mouth 3 (three) times daily.    [provider]  Insulin Glargine (TOUJEO SOLOSTAR) 300 UNIT/ML SOPN Inject 40 Units into the skin at bedtime.     [provider]  insulin lispro (HUMALOG KWIKPEN) 100 UNIT/ML KiwkPen Inject 24 Units into the skin daily with supper.  09/19/16   [provider]  Ipratropium-Albuterol (COMBIVENT) 20-100 MCG/ACT AERS respimat Inhale 1 puff into the lungs every 6 (six) hours as needed for wheezing or shortness of breath. 03/17/17   Hongalgi, Lenis Dickinson, MD  levothyroxine (SYNTHROID) 75 MCG tablet Take 75 mcg by mouth daily. 02/28/19   [provider]  metoprolol (LOPRESSOR) 50 MG tablet Take 50 mg by mouth 2 (two) times daily.  03/06/14   [provider]  oxyCODONE-acetaminophen (PERCOCET) 10-325 MG tablet Take 1 tablet by mouth every 6 (six) hours  as needed for pain.    [provider]  pantoprazole (PROTONIX) 40 MG tablet Take 40 mg by mouth daily.    [provider]  pregabalin (LYRICA) 25 MG capsule Take 25 mg by mouth daily. 01/24/19   [provider]  XTAMPZA ER 27 MG C12A Take 1 capsule by mouth 2 (two) times daily. 02/27/19   [provider]     Family History  Problem Relation Age of Onset  . Anemia Mother   . Diabetes Father   . Hypertension Brother   . Breast cancer Maternal Aunt        79s  . Breast  cancer Maternal Aunt 75       colon cancer    Social History   Socioeconomic History  . Marital status: Single    Spouse name: Not on file  . Number of children: Not on file  . Years of education: Not on file  . Highest education level: Not on file  Occupational History  . Occupation: retired   Tobacco Use  . Smoking status: Current Every Day Smoker    Packs/day: 0.25    Years: 40.00    Pack years: 10.00    Types: Cigarettes  . Smokeless tobacco: Never Used  Substance and Sexual Activity  . Alcohol use: No  . Drug use: No  . Sexual activity: Not on file  Other Topics Concern  . Not on file  Social History Narrative   Lives with daughter    Dorie Rank from [place]    Social Determinants of Health   Financial Resource Strain:   . Difficulty of Paying Living Expenses: Not on file  Food Insecurity:   . Worried About Charity fundraiser in the Last Year: Not on file  . Ran Out of Food in the Last Year: Not on file  Transportation Needs:   . Lack of Transportation (Medical): Not on file  . Lack of Transportation (Non-Medical): Not on file  Physical Activity:   . Days of Exercise per Week: Not on file  . Minutes of Exercise per Session: Not on file  Stress:   . Feeling of Stress : Not on file  Social Connections:   . Frequency of Communication with Friends and Family: Not on file  . Frequency of Social Gatherings with Friends and Family: Not on file  . Attends Religious Services: Not on file  . Active Member of Clubs or Organizations: Not on file  . Attends Archivist Meetings: Not on file  . Marital Status: Not on file     Review of Systems: A 12 point ROS discussed and pertinent positives are indicated in the HPI above.  All other systems are negative.  Review of Systems  Vital Signs: BP (!) 153/54   Pulse 70   Temp 98.4 F (36.9 C) (Oral)   Resp 18   Ht 5' 5.5" (1.664 m)   Wt 77.6 kg   SpO2 99%   BMI 28.02 kg/m   Physical Exam Vitals reviewed.    Constitutional:      Appearance: Normal appearance.  HENT:     Head: Normocephalic and atraumatic.  Eyes:     Extraocular Movements: Extraocular movements intact.  Cardiovascular:     Rate and Rhythm: Normal rate and regular rhythm.  Pulmonary:     Effort: Pulmonary effort is normal. No respiratory distress.     Breath sounds: Normal breath sounds.  Abdominal:     General: There is no  distension.     Palpations: Abdomen is soft.     Tenderness: There is no abdominal tenderness.  Musculoskeletal:        General: Normal range of motion.  Skin:    General: Skin is warm and dry.  Neurological:     General: No focal deficit present.     Mental Status: She is alert and oriented to person, place, and time.  Psychiatric:        Mood and Affect: Mood normal.        Behavior: Behavior normal.        Thought Content: Thought content normal.        Judgment: Judgment normal.     Imaging: No results found.  Labs:  CBC: Recent Labs    03/29/19 0329 03/30/19 0355 04/19/19 1014 05/01/19 1330  WBC 6.3 5.9 6.1 8.3  HGB 9.1* 9.2* 11.5* 11.8*  HCT 32.8* 33.0* 40.6 41.1  PLT 322 319 324 267    COAGS: Recent Labs    05/01/19 1330  INR 0.8    BMP: Recent Labs    03/27/19 0907 03/28/19 0351 03/29/19 0329 03/30/19 0355  NA 136 139 141 138  K 4.7 4.4 4.0 4.0  CL 102 106 98 94*  CO2 23 26 31  32  GLUCOSE 107* 115* 141* 135*  BUN 16 16 15 20   CALCIUM 9.7 9.7 10.4* 10.3  CREATININE 1.40* 1.25* 1.13* 1.32*  GFRNONAA 38* 44* 50* 41*  GFRAA 44* 51* 57* 48*    LIVER FUNCTION TESTS: Recent Labs    08/03/18 1040 01/30/19 1116 03/27/19 0907 03/28/19 0351  BILITOT 0.2* 0.3 0.6 0.5  AST 23 45* 15 13*  ALT 36 41 11 9  ALKPHOS 166* 185* 150* 144*  PROT 7.0 7.2 6.5 6.1*  ALBUMIN 3.7 4.1 3.1* 2.7*    TUMOR MARKERS: No results for input(s): AFPTM, CEA, CA199, CHROMGRNA in the last 8760 hours.  Assessment and Plan:  Iron deficiency anemia secondary to chronic blood  loss from a GI AVM.   She is currently treated with IV Iron monthly and her venous access is becoming poor.  Will proceed with placement of a tunneled catheter with port today by Dr. Vernard Gambles.  Risks and benefits of image guided port-a-catheter placement was discussed with the patient including, but not limited to bleeding, infection, pneumothorax, or fibrin sheath development and need for additional procedures.  All of the patient's questions were answered, patient is agreeable to proceed. Consent signed and in chart.  Thank you for this interesting consult.  I greatly enjoyed meeting ZARIN RINGEL and look forward to participating in their care.  A copy of this report was sent to the requesting provider on this date.  Electronically Signed: Murrell Redden, PA-C   05/01/2019, 2:04 PM      I spent a total of  30 Minutes  in face to face in clinical consultation, greater than 50% of which was counseling/coordinating care for [facility] A Cath.

## 2019-05-01 NOTE — Procedures (Signed)
  Procedure: R IJ Port placement   EBL:   minimal Complications:  none immediate  See full dictation in [facility] PACS.  D. Jamyrah Saur MD Main # [phone] Pager  [phone]    

## 2019-05-03 ENCOUNTER — Other Ambulatory Visit: Payer: Self-pay

## 2019-05-03 ENCOUNTER — Inpatient Hospital Stay: Payer: Medicare Other

## 2019-05-03 VITALS — BP 124/50 | HR 66 | Temp 98.2°F | Resp 17

## 2019-05-03 DIAGNOSIS — E559 Vitamin D deficiency, unspecified: Secondary | ICD-10-CM

## 2019-05-03 DIAGNOSIS — K5520 Angiodysplasia of colon without hemorrhage: Secondary | ICD-10-CM

## 2019-05-03 DIAGNOSIS — Z79899 Other long term (current) drug therapy: Secondary | ICD-10-CM

## 2019-05-03 DIAGNOSIS — D500 Iron deficiency anemia secondary to blood loss (chronic): Secondary | ICD-10-CM

## 2019-05-03 DIAGNOSIS — Z95828 Presence of other vascular implants and grafts: Secondary | ICD-10-CM

## 2019-05-03 DIAGNOSIS — Q2733 Arteriovenous malformation of digestive system vessel: Secondary | ICD-10-CM | POA: Diagnosis not present

## 2019-05-03 DIAGNOSIS — M129 Arthropathy, unspecified: Secondary | ICD-10-CM

## 2019-05-03 MED ORDER — HEPARIN SOD (PORK) LOCK FLUSH 100 UNIT/ML IV SOLN
500.0000 [IU] | Freq: Once | INTRAVENOUS | Status: AC | PRN
  Administered 2019-05-03: 500 [IU]
  Filled 2019-05-03: qty 5

## 2019-05-03 MED ORDER — SODIUM CHLORIDE 0.9 % IV SOLN
200.0000 mg | Freq: Once | INTRAVENOUS | Status: AC
  Administered 2019-05-03: 200 mg via INTRAVENOUS
  Filled 2019-05-03: qty 10

## 2019-05-03 MED ORDER — SODIUM CHLORIDE 0.9% FLUSH
10.0000 mL | Freq: Once | INTRAVENOUS | Status: AC | PRN
  Administered 2019-05-03: 10 mL
  Filled 2019-05-03: qty 10

## 2019-05-03 MED ORDER — SODIUM CHLORIDE 0.9% FLUSH
20.0000 mL | Freq: Once | INTRAVENOUS | Status: DC
  Filled 2019-05-03: qty 20

## 2019-05-03 MED ORDER — LIDOCAINE-PRILOCAINE 2.5-2.5 % EX CREA: 1.0000 "application " | TOPICAL_CREAM | CUTANEOUS | 1 refills | Status: DC | PRN

## 2019-05-17 NOTE — Progress Notes (Signed)
Cardiology Office Note:   Date:  05/20/2019  NAME:  Bethany [name]    MRN: [id number] DOB:  10/10/1950   PCP:  Wenda Low, MD  Cardiologist:  Evalina Field, MD   Referring MD: Wenda Low, MD   Chief Complaint  Patient presents with  . Congestive Heart Failure   History of Present Illness:   Bethany [name] is a 69 y.o. female with a hx of diabetes, hypertension, CKD, renal arterial thrombosis (negative hypercoag work-up), IDA  who is being seen today for the evaluation of heart failure at the request of Wenda Low, MD.  She is admitted to the hospital in November 2020 and diagnosed with heart failure with preserved ejection fraction.  She underwent diuretic therapy and did well.  She is left the hospital and done quite well.  She does physical therapy 2 days a week for 1 hour sessions which includes aerobic activity as well as weight lifting.  She has no chest pain or trouble breathing with this.  She has no lower extremity edema reported.  Her risk factors for heart failure with preserved ejection fraction include CKD, hypertension, diabetes.  Her diabetes well controlled with an A1c of 5.4.  Her lipid profile shows an LDL of 129, not at goal.  She does smoke daily.  Despite this, her blood pressure is well controlled today.  She takes amlodipine, Lasix, hydralazine, metoprolol.  She is never had a history of a heart attack or stroke.  She reports overall she is doing quite well.  The diagnosis of heart failure was new to her.  Apparently there was some issues with altered mentation and I do wonder about excessive narcotic use.  She does take chronic narcotics for back pain.  1. HFpEF, 60-65%, Grade 3 DD 03/2019 2. HTN 3. DM 4. CKD 5. Renal artery thrombosis s/p L nephrectomy (2013; negative hypercoag work-up, no AC due to IDA and bleeding) 6. CKD  Past Medical History: Past Medical History:  Diagnosis Date  . Acute renal failure ([place])   . Arthritis   . Diabetes mellitus  without complication ([place])    type II   . Hypercalcemia   . Hyperlipidemia   . Hypertension   . Renal disorder   . Single kidney   . Thrombosis     Past Surgical History: Past Surgical History:  Procedure Laterality Date  . ABDOMINAL HYSTERECTOMY    . blood clots removed from descending aorta     . CHOLECYSTECTOMY    . COLONOSCOPY    . COLONOSCOPY WITH PROPOFOL N/A 04/17/2017   Procedure: COLONOSCOPY WITH PROPOFOL;  Surgeon: Wilford Corner, MD;  [facility];  Service: Endoscopy;  Laterality: N/A;  . ESOPHAGOGASTRODUODENOSCOPY (EGD) WITH PROPOFOL N/A 04/17/2017   Procedure: ESOPHAGOGASTRODUODENOSCOPY (EGD) WITH PROPOFOL;  Surgeon: Wilford Corner, MD;  [facility];  Service: Endoscopy;  Laterality: N/A;  . IR IMAGING GUIDED PORT INSERTION  05/01/2019  . NEPHRECTOMY RECIPIENT      Current Medications: No outpatient medications have been marked as taking for the 05/20/19 encounter (Office Visit) with Geralynn Rile, MD.     Allergies:    Contrast media [iodinated diagnostic agents]   Social History: Social History   Socioeconomic History  . Marital status: Single    Spouse name: Not on file  . Number of children: 3  . Years of education: Not on file  . Highest education level: Not on file  Occupational History  . Occupation: retired   Tobacco  Use  . Smoking status: Current Every Day Smoker    Packs/day: 0.25    Years: 40.00    Pack years: 10.00    Types: Cigarettes  . Smokeless tobacco: Never Used  Substance and Sexual Activity  . Alcohol use: Yes  . Drug use: No  . Sexual activity: Not on file  Other Topics Concern  . Not on file  Social History Narrative   Lives with daughter    Dorie Rank from [place]    Social Determinants of Health   Financial Resource Strain:   . Difficulty of Paying Living Expenses: Not on file  Food Insecurity:   . Worried About Charity fundraiser in the Last Year: Not on file  . Ran Out of Food in the Last  Year: Not on file  Transportation Needs:   . Lack of Transportation (Medical): Not on file  . Lack of Transportation (Non-Medical): Not on file  Physical Activity:   . Days of Exercise per Week: Not on file  . Minutes of Exercise per Session: Not on file  Stress:   . Feeling of Stress : Not on file  Social Connections:   . Frequency of Communication with Friends and Family: Not on file  . Frequency of Social Gatherings with Friends and Family: Not on file  . Attends Religious Services: Not on file  . Active Member of Clubs or Organizations: Not on file  . Attends Archivist Meetings: Not on file  . Marital Status: Not on file     Family History: [name] family history includes Breast cancer in her maternal aunt; Breast cancer (age of onset: 18) in her maternal aunt; Diabetes in her father; Hypertension in her brother and mother.  ROS:   All other ROS reviewed and negative. Pertinent positives noted in the HPI.     EKGs/Labs/Other Studies Reviewed:   The following studies were personally reviewed by me today:  LDL 129, HDL 46, total cholesterol 211, triglycerides 132, A1c 5.4, serum creatinine 1.41, TSH 1.1  EKG:  EKG is ordered today.  The ekg ordered today demonstrates sinus bradycardia for, prior anteroseptal infarct, no acute ischemic changes, and was personally reviewed by me.   TTE 03/28/2019  1. Left ventricular ejection fraction, by visual estimation, is 60 to 65%. The left ventricle has normal function. There is mildly increased left ventricular hypertrophy.  2. Elevated left atrial pressure.  3. Left ventricular diastolic parameters are consistent with Grade III diastolic dysfunction (restrictive).  4. The left ventricle has no regional wall motion abnormalities.  5. Global right ventricle has normal systolic function.The right ventricular size is normal. No increase in right ventricular wall thickness.  6. Left atrial size was mildly dilated.  7. Right  atrial size was normal.  8. Presence of pericardial fat pad.  9. Trivial pericardial effusion is present. 10. Mild mitral annular calcification. 11. The mitral valve is grossly normal. No evidence of mitral valve regurgitation. 12. The tricuspid valve is grossly normal. Tricuspid valve regurgitation is not demonstrated. 13. The aortic valve is tricuspid. Aortic valve regurgitation is not visualized. Mild aortic valve sclerosis without stenosis. 14. The pulmonic valve was grossly normal. Pulmonic valve regurgitation is not visualized. 15. TR signal is inadequate for assessing pulmonary artery systolic pressure. 16. The inferior vena cava is dilated in size with >50% respiratory variability, suggesting right atrial pressure of 8 mmHg.  Recent Labs: 03/27/2019: B Natriuretic Peptide 232.2; TSH 1.126 03/28/2019: ALT 9 03/30/2019: BUN 20; Creatinine,  Ser 1.32; Potassium 4.0; Sodium 138 05/01/2019: Hemoglobin 11.8; Platelets 267   Recent Lipid Panel    Component Value Date/Time   TRIG 132 03/27/2019 0907    Physical Exam:   VS:  BP 122/69 (BP Location: Left Arm, Patient Position: Sitting, Cuff Size: Normal)   Ht 5\' 5"  (1.651 m)   Wt 174 lb 6.4 oz (79.1 kg)   SpO2 100%   BMI 29.02 kg/m    Wt Readings from Last 3 Encounters:  05/20/19 174 lb 6.4 oz (79.1 kg)  05/01/19 171 lb (77.6 kg)  04/19/19 172 lb 14.4 oz (78.4 kg)    General: Well nourished, well developed, in no acute distress Heart: Atraumatic, normal size  Eyes: PEERLA, EOMI  Neck: Supple, no JVD Endocrine: No thryomegaly Cardiac: Normal S1, S2; RRR; no murmurs, rubs, or gallops Lungs: Clear to auscultation bilaterally, no wheezing, rhonchi or rales  Abd: Soft, nontender, no hepatomegaly  Ext: No edema, pulses 2+ Musculoskeletal: No deformities, BUE and BLE strength normal and equal Skin: Warm and dry, no rashes   Neuro: Alert and oriented to person, place, time, and situation, CNII-XII grossly intact, no focal deficits   Psych: Normal mood and affect   ASSESSMENT:   Bethany [name] is a 69 y.o. female who presents for the following: 1. Chronic heart failure with preserved ejection fraction ([place])   2. Essential hypertension   3. Stage 3a chronic kidney disease   4. Mixed hyperlipidemia   5. Tobacco abuse     PLAN:   1. Chronic heart failure with preserved ejection fraction [facility]) -Diagnosed November 2020.  No increased wall thickness to suggest amyloid.  Risk factors include CKD, diabetes, hypertension. -She is euvolemic on exam her blood pressure stable -Diabetes is well controlled -We will change her Lasix to 40 mg as needed for lower extremity edema -Continue amlodipine and metoprolol.  Stop hydralazine. -We will see her back in 6 months to reassess symptoms.  She will let us know if she has any change in symptoms. -She is quite active and able to exercise twice weekly for 1 hour physical therapy and has no limitations of chest pain or shortness of breath.  2. Essential hypertension -Continue current blood pressure medications  3. Stage 3a chronic kidney disease -Stable  4. Mixed hyperlipidemia -Her LDL cholesterol should be less than 70.  We will recheck profile as well as a BMP in the next week or so.  I will follow with her by phone of the results of this.  5. Tobacco abuse -Smoking cessation counseling provided today.   Disposition: Return in about 6 months (around 11/17/2019).  Medication Adjustments/Labs and Tests Ordered: Current medicines are reviewed at length with the patient today.  Concerns regarding medicines are outlined above.  Orders Placed This Encounter  Procedures  . Lipid panel  . Basic metabolic panel  . EKG 12-Lead   Meds ordered this encounter  Medications  . furosemide (LASIX) 40 MG tablet    Sig: Take 1 tablet (40 mg total) by mouth daily as needed.    Dispense:  45 tablet    Refill:  3    D/c previous rX    Patient Instructions  Medication Instructions:   Start taking your Lasix as needed for leg swelling. Stop taking Hydralazine.  If you need a refill on your cardiac medications before your next appointment, please call your pharmacy.   Lab work: Fasting BMET and Lipid panel If you have labs (blood work) drawn today  and your tests are completely normal, you will receive your results only by: MyChart Message (if you have MyChart) OR A paper copy in the mail If you have any lab test that is abnormal or we need to change your treatment, we will call you to review the results.  Testing/Procedures: NONE  Follow-Up: At [facility], you and your health needs are our priority.  As part of our continuing mission to provide you with exceptional heart care, we have created designated Provider Care Teams.  These Care Teams include your primary Cardiologist (physician) and Advanced Practice Providers (APPs -  Physician Assistants and Nurse Practitioners) who all work together to provide you with the care you need, when you need it. You may see Evalina Field, MD or one of the following Advanced Practice Providers on your designated Care Team:    Almyra Deforest, PA-C  Fabian Sharp, [place] or   Roby Lofts, [place] Your physician wants you to follow-up in: 6 months in the office.          Signed, Addison Naegeli. Audie Box, [place]  [address] [place], [place] [address] [phone]  05/20/2019 3:11 PM

## 2019-05-20 ENCOUNTER — Other Ambulatory Visit: Payer: Self-pay

## 2019-05-20 ENCOUNTER — Ambulatory Visit (INDEPENDENT_AMBULATORY_CARE_PROVIDER_SITE_OTHER): Payer: Medicare Other | Admitting: Cardiovascular Disease

## 2019-05-20 ENCOUNTER — Encounter: Payer: Self-pay | Admitting: Cardiovascular Disease

## 2019-05-20 VITALS — BP 122/69 | Ht 65.0 in | Wt 174.4 lb

## 2019-05-20 DIAGNOSIS — Z720 Tobacco use: Secondary | ICD-10-CM

## 2019-05-20 DIAGNOSIS — I10 Essential (primary) hypertension: Secondary | ICD-10-CM | POA: Diagnosis not present

## 2019-05-20 DIAGNOSIS — N1831 Chronic kidney disease, stage 3a: Secondary | ICD-10-CM | POA: Diagnosis not present

## 2019-05-20 DIAGNOSIS — I5032 Chronic diastolic (congestive) heart failure: Secondary | ICD-10-CM | POA: Diagnosis not present

## 2019-05-20 DIAGNOSIS — E782 Mixed hyperlipidemia: Secondary | ICD-10-CM

## 2019-05-20 MED ORDER — FUROSEMIDE 40 MG PO TABS: 40.0000 mg | ORAL_TABLET | Freq: Every day | ORAL | 3 refills | Status: DC | PRN

## 2019-05-20 NOTE — Patient Instructions (Signed)
Medication Instructions:  Start taking your Lasix as needed for leg swelling. Stop taking Hydralazine.  If you need a refill on your cardiac medications before your next appointment, please call your pharmacy.   Lab work: Fasting BMET and Lipid panel If you have labs (blood work) drawn today and your tests are completely normal, you will receive your results only by: MyChart Message (if you have MyChart) OR A paper copy in the mail If you have any lab test that is abnormal or we need to change your treatment, we will call you to review the results.  Testing/Procedures: NONE  Follow-Up: At [facility], you and your health needs are our priority.  As part of our continuing mission to provide you with exceptional heart care, we have created designated Provider Care Teams.  These Care Teams include your primary Cardiologist (physician) and Advanced Practice Providers (APPs -  Physician Assistants and Nurse Practitioners) who all work together to provide you with the care you need, when you need it. You may see Evalina Field, MD or one of the following Advanced Practice Providers on your designated Care Team:    Almyra Deforest, PA-C  Fabian Sharp, [place] or   Roby Lofts, [place] Your physician wants you to follow-up in: 6 months in the office.

## 2019-05-24 ENCOUNTER — Telehealth: Payer: Self-pay | Admitting: Hematology

## 2019-05-24 ENCOUNTER — Ambulatory Visit: Payer: Medicare Other

## 2019-05-24 ENCOUNTER — Other Ambulatory Visit: Payer: Medicare Other

## 2019-05-24 NOTE — Telephone Encounter (Signed)
Returned patient's phone call regarding rescheduling an appointment, left a voicemail. 

## 2019-06-21 ENCOUNTER — Ambulatory Visit: Payer: Medicare Other | Admitting: Hematology

## 2019-06-21 ENCOUNTER — Inpatient Hospital Stay: Payer: Medicare Other | Attending: Hematology

## 2019-06-21 ENCOUNTER — Inpatient Hospital Stay: Payer: Medicare Other

## 2019-06-21 ENCOUNTER — Other Ambulatory Visit: Payer: Self-pay

## 2019-06-21 VITALS — BP 143/58 | HR 56 | Temp 98.6°F | Resp 18

## 2019-06-21 DIAGNOSIS — Q2733 Arteriovenous malformation of digestive system vessel: Secondary | ICD-10-CM | POA: Insufficient documentation

## 2019-06-21 DIAGNOSIS — G8929 Other chronic pain: Secondary | ICD-10-CM

## 2019-06-21 DIAGNOSIS — E559 Vitamin D deficiency, unspecified: Secondary | ICD-10-CM

## 2019-06-21 DIAGNOSIS — Z95828 Presence of other vascular implants and grafts: Secondary | ICD-10-CM

## 2019-06-21 DIAGNOSIS — D500 Iron deficiency anemia secondary to blood loss (chronic): Secondary | ICD-10-CM | POA: Diagnosis present

## 2019-06-21 DIAGNOSIS — K5520 Angiodysplasia of colon without hemorrhage: Secondary | ICD-10-CM

## 2019-06-21 LAB — CBC WITH DIFFERENTIAL (CANCER CENTER ONLY)
Abs Immature Granulocytes: 0.02 10*3/uL (ref 0.00–0.07)
Basophils Absolute: 0 10*3/uL (ref 0.0–0.1)
Basophils Relative: 0 %
Eosinophils Absolute: 0.2 10*3/uL (ref 0.0–0.5)
Eosinophils Relative: 2 %
HCT: 32.4 % — ABNORMAL LOW (ref 36.0–46.0)
Hemoglobin: 9.5 g/dL — ABNORMAL LOW (ref 12.0–15.0)
Immature Granulocytes: 0 %
Lymphocytes Relative: 22 %
Lymphs Abs: 1.7 10*3/uL (ref 0.7–4.0)
MCH: 22.8 pg — ABNORMAL LOW (ref 26.0–34.0)
MCHC: 29.3 g/dL — ABNORMAL LOW (ref 30.0–36.0)
MCV: 77.9 fL — ABNORMAL LOW (ref 80.0–100.0)
Monocytes Absolute: 0.6 10*3/uL (ref 0.1–1.0)
Monocytes Relative: 7 %
Neutro Abs: 5.4 10*3/uL (ref 1.7–7.7)
Neutrophils Relative %: 69 %
Platelet Count: 259 10*3/uL (ref 150–400)
RBC: 4.16 MIL/uL (ref 3.87–5.11)
RDW: 17.8 % — ABNORMAL HIGH (ref 11.5–15.5)
WBC Count: 7.9 10*3/uL (ref 4.0–10.5)
nRBC: 0 % (ref 0.0–0.2)

## 2019-06-21 LAB — IRON AND TIBC
Iron: 26 ug/dL — ABNORMAL LOW (ref 41–142)
Saturation Ratios: 7 % — ABNORMAL LOW (ref 21–57)
TIBC: 376 ug/dL (ref 236–444)
UIBC: 351 ug/dL (ref 120–384)

## 2019-06-21 LAB — FERRITIN: Ferritin: 26 ng/mL (ref 11–307)

## 2019-06-21 LAB — VITAMIN D 25 HYDROXY (VIT D DEFICIENCY, FRACTURES): Vit D, 25-Hydroxy: 35.28 ng/mL (ref 30–100)

## 2019-06-21 MED ORDER — SODIUM CHLORIDE 0.9% FLUSH
10.0000 mL | INTRAVENOUS | Status: DC | PRN
  Administered 2019-06-21: 10 mL via INTRAVENOUS
  Filled 2019-06-21: qty 10

## 2019-06-21 MED ORDER — SODIUM CHLORIDE 0.9 % IV SOLN
INTRAVENOUS | Status: DC
  Filled 2019-06-21: qty 250

## 2019-06-21 MED ORDER — HEPARIN SOD (PORK) LOCK FLUSH 100 UNIT/ML IV SOLN
500.0000 [IU] | Freq: Once | INTRAVENOUS | Status: AC | PRN
  Administered 2019-06-21: 15:00:00 500 [IU]
  Filled 2019-06-21: qty 5

## 2019-06-21 MED ORDER — SODIUM CHLORIDE 0.9 % IV SOLN
200.0000 mg | Freq: Once | INTRAVENOUS | Status: AC
  Administered 2019-06-21: 200 mg via INTRAVENOUS
  Filled 2019-06-21: qty 200

## 2019-06-21 MED ORDER — ACETAMINOPHEN 325 MG PO TABS
650.0000 mg | ORAL_TABLET | Freq: Once | ORAL | Status: AC
  Administered 2019-06-21: 14:00:00 650 mg via ORAL

## 2019-06-21 MED ORDER — ACETAMINOPHEN 325 MG PO TABS
ORAL_TABLET | ORAL | Status: AC
  Filled 2019-06-21: qty 2

## 2019-06-21 MED ORDER — SODIUM CHLORIDE 0.9% FLUSH
10.0000 mL | Freq: Once | INTRAVENOUS | Status: AC | PRN
  Administered 2019-06-21: 15:00:00 10 mL
  Filled 2019-06-21: qty 10

## 2019-06-24 ENCOUNTER — Telehealth: Payer: Self-pay

## 2019-06-24 ENCOUNTER — Telehealth: Payer: Self-pay | Admitting: Hematology

## 2019-06-24 NOTE — Telephone Encounter (Signed)
Scheduled appt per 2/6 sch message - pt aware of appt dates and times

## 2019-06-24 NOTE — Telephone Encounter (Signed)
-----   Message from Truitt Merle, MD sent at 06/22/2019 12:11 PM EST ----- Please let pt know her anemia has gotten worse last week, and I have sent a high priority schedule message for iv venofer twice week for 2 weeks, thanks  Bethany [name]

## 2019-06-24 NOTE — Telephone Encounter (Signed)
I spoke with Bethany [name].  I let her know that her iron level was low and Dr. Burr Medico wants her to have iron infusions.  I told her our scheduling dept will call her with those appt times.  She verbalized understanding.

## 2019-06-25 ENCOUNTER — Other Ambulatory Visit: Payer: Self-pay | Admitting: Internal Medicine

## 2019-06-25 DIAGNOSIS — R911 Solitary pulmonary nodule: Secondary | ICD-10-CM

## 2019-06-26 ENCOUNTER — Other Ambulatory Visit: Payer: Self-pay

## 2019-06-26 ENCOUNTER — Inpatient Hospital Stay: Payer: Medicare Other

## 2019-06-26 VITALS — BP 151/68 | HR 66 | Temp 98.8°F | Resp 20

## 2019-06-26 DIAGNOSIS — Q2733 Arteriovenous malformation of digestive system vessel: Secondary | ICD-10-CM | POA: Diagnosis not present

## 2019-06-26 DIAGNOSIS — D500 Iron deficiency anemia secondary to blood loss (chronic): Secondary | ICD-10-CM

## 2019-06-26 DIAGNOSIS — K5520 Angiodysplasia of colon without hemorrhage: Secondary | ICD-10-CM

## 2019-06-26 MED ORDER — SODIUM CHLORIDE 0.9 % IV SOLN
INTRAVENOUS | Status: DC
  Filled 2019-06-26: qty 250

## 2019-06-26 MED ORDER — SODIUM CHLORIDE 0.9% FLUSH
10.0000 mL | Freq: Once | INTRAVENOUS | Status: AC | PRN
  Administered 2019-06-26: 16:00:00 10 mL
  Filled 2019-06-26: qty 10

## 2019-06-26 MED ORDER — SODIUM CHLORIDE 0.9 % IV SOLN
200.0000 mg | Freq: Once | INTRAVENOUS | Status: AC
  Administered 2019-06-26: 200 mg via INTRAVENOUS
  Filled 2019-06-26: qty 200

## 2019-06-26 MED ORDER — HEPARIN SOD (PORK) LOCK FLUSH 100 UNIT/ML IV SOLN
500.0000 [IU] | Freq: Once | INTRAVENOUS | Status: AC | PRN
  Administered 2019-06-26: 500 [IU]
  Filled 2019-06-26: qty 5

## 2019-06-26 NOTE — Patient Instructions (Signed)

## 2019-06-28 ENCOUNTER — Inpatient Hospital Stay: Payer: Medicare Other

## 2019-06-28 ENCOUNTER — Other Ambulatory Visit: Payer: Self-pay

## 2019-06-28 VITALS — BP 135/51 | HR 51 | Temp 98.1°F | Resp 18

## 2019-06-28 DIAGNOSIS — Q2733 Arteriovenous malformation of digestive system vessel: Secondary | ICD-10-CM | POA: Diagnosis not present

## 2019-06-28 DIAGNOSIS — D500 Iron deficiency anemia secondary to blood loss (chronic): Secondary | ICD-10-CM

## 2019-06-28 DIAGNOSIS — K5520 Angiodysplasia of colon without hemorrhage: Secondary | ICD-10-CM

## 2019-06-28 MED ORDER — HEPARIN SOD (PORK) LOCK FLUSH 100 UNIT/ML IV SOLN
500.0000 [IU] | Freq: Once | INTRAVENOUS | Status: AC | PRN
  Administered 2019-06-28: 500 [IU]
  Filled 2019-06-28: qty 5

## 2019-06-28 MED ORDER — SODIUM CHLORIDE 0.9% FLUSH
10.0000 mL | Freq: Once | INTRAVENOUS | Status: AC | PRN
  Administered 2019-06-28: 10 mL
  Filled 2019-06-28: qty 10

## 2019-06-28 MED ORDER — SODIUM CHLORIDE 0.9 % IV SOLN
INTRAVENOUS | Status: DC
  Filled 2019-06-28: qty 250

## 2019-06-28 MED ORDER — SODIUM CHLORIDE 0.9 % IV SOLN
200.0000 mg | Freq: Once | INTRAVENOUS | Status: AC
  Administered 2019-06-28: 200 mg via INTRAVENOUS
  Filled 2019-06-28: qty 200

## 2019-06-28 NOTE — Patient Instructions (Signed)

## 2019-07-03 ENCOUNTER — Inpatient Hospital Stay: Payer: Medicare Other

## 2019-07-03 ENCOUNTER — Other Ambulatory Visit: Payer: Self-pay

## 2019-07-03 VITALS — BP 148/69 | HR 63 | Temp 98.3°F | Resp 20

## 2019-07-03 DIAGNOSIS — G8929 Other chronic pain: Secondary | ICD-10-CM

## 2019-07-03 DIAGNOSIS — Z95828 Presence of other vascular implants and grafts: Secondary | ICD-10-CM

## 2019-07-03 DIAGNOSIS — K5520 Angiodysplasia of colon without hemorrhage: Secondary | ICD-10-CM

## 2019-07-03 DIAGNOSIS — Q2733 Arteriovenous malformation of digestive system vessel: Secondary | ICD-10-CM | POA: Diagnosis not present

## 2019-07-03 DIAGNOSIS — M549 Dorsalgia, unspecified: Secondary | ICD-10-CM

## 2019-07-03 DIAGNOSIS — D500 Iron deficiency anemia secondary to blood loss (chronic): Secondary | ICD-10-CM

## 2019-07-03 DIAGNOSIS — M5489 Other dorsalgia: Secondary | ICD-10-CM

## 2019-07-03 MED ORDER — ACETAMINOPHEN 325 MG PO TABS
650.0000 mg | ORAL_TABLET | Freq: Four times a day (QID) | ORAL | Status: DC | PRN
  Administered 2019-07-03: 650 mg via ORAL

## 2019-07-03 MED ORDER — SODIUM CHLORIDE 0.9 % IV SOLN
INTRAVENOUS | Status: DC
  Filled 2019-07-03: qty 250

## 2019-07-03 MED ORDER — SODIUM CHLORIDE 0.9 % IV SOLN
200.0000 mg | Freq: Once | INTRAVENOUS | Status: DC
  Filled 2019-07-03: qty 10

## 2019-07-03 MED ORDER — ACETAMINOPHEN 325 MG PO TABS
ORAL_TABLET | ORAL | Status: AC
  Filled 2019-07-03: qty 2

## 2019-07-03 MED ORDER — LIDOCAINE-PRILOCAINE 2.5-2.5 % EX CREA: 1.0000 "application " | TOPICAL_CREAM | CUTANEOUS | 1 refills | Status: DC | PRN

## 2019-07-03 MED ORDER — HEPARIN SOD (PORK) LOCK FLUSH 100 UNIT/ML IV SOLN
500.0000 [IU] | Freq: Once | INTRAVENOUS | Status: AC
  Administered 2019-07-03: 500 [IU] via INTRAVENOUS
  Filled 2019-07-03: qty 5

## 2019-07-03 MED ORDER — SODIUM CHLORIDE 0.9% FLUSH
10.0000 mL | INTRAVENOUS | Status: DC | PRN
  Administered 2019-07-03: 10 mL via INTRAVENOUS
  Filled 2019-07-03: qty 10

## 2019-07-03 NOTE — Telephone Encounter (Signed)
Notified by patient's infusion nurse that patient needs a refill on emla cream prescription and that her pain management provider Apolonio Schneiders McCoy-NP with [facility]) has not received lab work from our office.   E-scribed emla cream prescription to [facility] to see what labs provider needed. Representative I spoke with listed a lengthy list of desired lab results. Informed her that Dr. Burr Medico only ordered a few of those tests and that I could send over a copy of those results, but we would not be able to provide documentation on all desired results. Representative said that was fine and gave fax number as [phone]. Labs printed and faxed, and received confirmation that fax went through successfully.

## 2019-07-03 NOTE — Patient Instructions (Signed)

## 2019-07-04 ENCOUNTER — Inpatient Hospital Stay: Admission: RE | Admit: 2019-07-04 | Payer: Medicare Other | Source: Ambulatory Visit

## 2019-07-05 ENCOUNTER — Inpatient Hospital Stay: Payer: Medicare Other

## 2019-07-05 ENCOUNTER — Other Ambulatory Visit: Payer: Self-pay

## 2019-07-05 VITALS — BP 149/51 | HR 61 | Temp 98.3°F | Resp 18

## 2019-07-05 DIAGNOSIS — D500 Iron deficiency anemia secondary to blood loss (chronic): Secondary | ICD-10-CM

## 2019-07-05 DIAGNOSIS — Q2733 Arteriovenous malformation of digestive system vessel: Secondary | ICD-10-CM | POA: Diagnosis not present

## 2019-07-05 DIAGNOSIS — K5520 Angiodysplasia of colon without hemorrhage: Secondary | ICD-10-CM

## 2019-07-05 MED ORDER — HEPARIN SOD (PORK) LOCK FLUSH 100 UNIT/ML IV SOLN
250.0000 [IU] | Freq: Once | INTRAVENOUS | Status: AC | PRN
  Administered 2019-07-05: 500 [IU]
  Filled 2019-07-05: qty 5

## 2019-07-05 MED ORDER — SODIUM CHLORIDE 0.9% FLUSH
3.0000 mL | Freq: Once | INTRAVENOUS | Status: AC | PRN
  Administered 2019-07-05: 10 mL
  Filled 2019-07-05: qty 10

## 2019-07-05 MED ORDER — SODIUM CHLORIDE 0.9 % IV SOLN
INTRAVENOUS | Status: DC
  Filled 2019-07-05: qty 250

## 2019-07-05 MED ORDER — SODIUM CHLORIDE 0.9 % IV SOLN
200.0000 mg | Freq: Once | INTRAVENOUS | Status: AC
  Administered 2019-07-05: 200 mg via INTRAVENOUS
  Filled 2019-07-05: qty 200

## 2019-07-05 NOTE — Patient Instructions (Signed)

## 2019-07-05 NOTE — Progress Notes (Signed)
She did not want to stay for 30 minutes post infusion. Vital signs obtained and discharged home.

## 2019-07-12 ENCOUNTER — Ambulatory Visit
Admission: RE | Admit: 2019-07-12 | Discharge: 2019-07-12 | Disposition: A | Discharge status: Home / Self Care | Payer: Medicare Other | Source: Ambulatory Visit | Attending: Internal Medicine | Admitting: Internal Medicine

## 2019-07-12 DIAGNOSIS — R911 Solitary pulmonary nodule: Secondary | ICD-10-CM

## 2019-07-15 NOTE — Progress Notes (Signed)
[place]   [phone] [phone]   Clinic Follow up Note   Patient Care Team: Wenda Low, MD as PCP - General (Internal Medicine) O'Neal, Cassie Freer, MD as PCP - Cardiology (Cardiology) Truitt Merle, MD as Consulting Physician (Hematology) Angelia Mould, MD as Consulting Physician (Vascular Surgery) Koleen Distance, MD as Referring Physician (Gastroenterology)  Date of Service:  07/19/2019  CHIEF COMPLAINT: F/u of Anemia   CURRENT THERAPY:  iv Feraheme as needed (if ferritin <100). Changed to IV Venofer on 01/02/19  INTERVAL HISTORY:  Bethany [name] is here for a follow up of anemia. She presents to the clinic alone. She notes she has been more fatigued last few days. She notes with more frequent Venofer she doe snot feel much different. She notes last week she noticed having black stool. She note she has BM every 1-3 days. She denies any diarrhea. She is not on oral iron. She notes she is doing PT currently for her chronic body pain. She notes she has cut back on smoking, but after seeing lung nodules on CT chest she will try to stop altogether.   REVIEW OF SYSTEMS:   Constitutional: Denies fevers, chills or abnormal weight loss Eyes: Denies blurriness of vision Ears, nose, mouth, throat, and face: Denies mucositis or sore throat Respiratory: Denies cough, dyspnea or wheezes Cardiovascular: Denies palpitation, chest discomfort or lower extremity swelling Gastrointestinal:  Denies nausea, heartburn or change in bowel habits Skin: Denies abnormal skin rashes Lymphatics: Denies new lymphadenopathy or easy bruising Neurological:Denies numbness, tingling or new weaknesses Behavioral/Psych: Mood is stable, no new changes  All other systems were reviewed with the patient and are negative.  MEDICAL HISTORY:  Past Medical History:  Diagnosis Date  . Acute renal failure ([place])   . Arthritis   . Diabetes mellitus without complication  ([place])    type II   . Hypercalcemia   . Hyperlipidemia   . Hypertension   . Renal disorder   . Single kidney   . Thrombosis     SURGICAL HISTORY: Past Surgical History:  Procedure Laterality Date  . ABDOMINAL HYSTERECTOMY    . blood clots removed from descending aorta     . CHOLECYSTECTOMY    . COLONOSCOPY    . COLONOSCOPY WITH PROPOFOL N/A 04/17/2017   Procedure: COLONOSCOPY WITH PROPOFOL;  Surgeon: Wilford Corner, MD;  Location: [facility];  Service: Endoscopy;  Laterality: N/A;  . ESOPHAGOGASTRODUODENOSCOPY (EGD) WITH PROPOFOL N/A 04/17/2017   Procedure: ESOPHAGOGASTRODUODENOSCOPY (EGD) WITH PROPOFOL;  Surgeon: Wilford Corner, MD;  Location: [facility];  Service: Endoscopy;  Laterality: N/A;  . IR IMAGING GUIDED PORT INSERTION  05/01/2019  . NEPHRECTOMY RECIPIENT      I have reviewed the social history and family history with the patient and they are unchanged from previous note.  ALLERGIES:  is allergic to contrast media [iodinated diagnostic agents].  MEDICATIONS:  Current Outpatient Medications  Medication Sig Dispense Refill  . allopurinol (ZYLOPRIM) 300 MG tablet Take 1 tablet (300 mg total) by mouth daily. 30 tablet 0  . amLODipine (NORVASC) 5 MG tablet Take 1 tablet (5 mg total) by mouth daily. 30 tablet 0  . atorvastatin (LIPITOR) 20 MG tablet Take 20 mg by mouth daily.    . Dulaglutide (TRULICITY) 1.5 0000000 SOPN Inject 1.5 mg into the skin once a week. Mondays    . feeding supplement, ENSURE ENLIVE, (ENSURE ENLIVE) LIQD Take 237 mLs by mouth 2 (two) times daily between meals. 237 mL  12  . furosemide (LASIX) 40 MG tablet Take 1 tablet (40 mg total) by mouth daily as needed. 45 tablet 3  . gabapentin (NEURONTIN) 600 MG tablet Take 600 mg by mouth 4 (four) times daily.     . Insulin Glargine (TOUJEO SOLOSTAR) 300 UNIT/ML SOPN Inject 40 Units into the skin at bedtime.     . insulin lispro (HUMALOG KWIKPEN) 100 UNIT/ML KiwkPen Inject 24 Units into the skin  daily with supper.     . Ipratropium-Albuterol (COMBIVENT) 20-100 MCG/ACT AERS respimat Inhale 1 puff into the lungs every 6 (six) hours as needed for wheezing or shortness of breath. 1 Inhaler 0  . levothyroxine (SYNTHROID) 75 MCG tablet Take 75 mcg by mouth daily.    Marland Kitchen lidocaine-prilocaine (EMLA) cream Apply 1 application topically as needed. 30 g 1  . metoprolol (LOPRESSOR) 50 MG tablet Take 50 mg by mouth 2 (two) times daily.     Marland Kitchen oxyCODONE-acetaminophen (PERCOCET) 10-325 MG tablet Take 1 tablet by mouth every 6 (six) hours as needed for pain.    . pantoprazole (PROTONIX) 40 MG tablet Take 40 mg by mouth daily.    . pregabalin (LYRICA) 25 MG capsule Take 25 mg by mouth daily.    Marland Kitchen XTAMPZA ER 27 MG C12A Take 1 capsule by mouth 2 (two) times daily.     No current facility-administered medications for this visit.    PHYSICAL EXAMINATION: ECOG PERFORMANCE STATUS: 1 - Symptomatic but completely ambulatory  Vitals with BMI 07/19/2019  Height 5'5"  Weight 174 lbs 14 oz  BMI   Systolic 0000000  Diastolic 70  Pulse 75    Due to COVID19 we will limit examination to appearance. Patient had no complaints.  GENERAL:alert, no distress and comfortable SKIN: skin color normal, no rashes or significant lesions EYES: normal, Conjunctiva are pink and non-injected, sclera clear  NEURO: alert & oriented x 3 with fluent speech   LABORATORY DATA:  I have reviewed the data as listed CBC Latest Ref Rng & Units 07/19/2019 06/21/2019 05/01/2019  WBC 4.0 - 10.5 K/uL 6.9 7.9 8.3  Hemoglobin 12.0 - 15.0 g/dL 11.6(L) 9.5(L) 11.8(L)  Hematocrit 36.0 - 46.0 % 38.8 32.4(L) 41.1  Platelets 150 - 400 K/uL 271 259 267     CMP Latest Ref Rng & Units 07/19/2019 03/30/2019 03/29/2019  Glucose 70 - 99 mg/dL 77 135(H) 141(H)  BUN 8 - 23 mg/dL 18 20 15   Creatinine 0.44 - 1.00 mg/dL 1.03(H) 1.32(H) 1.13(H)  Sodium 135 - 145 mmol/L 142 138 141  Potassium 3.5 - 5.1 mmol/L 3.9 4.0 4.0  Chloride 98 - 111 mmol/L 107 94(L)  98  CO2 22 - 32 mmol/L 23 32 31  Calcium 8.9 - 10.3 mg/dL 10.3 10.3 10.4(H)  Total Protein 6.5 - 8.1 g/dL 7.4 - -  Total Bilirubin 0.3 - 1.2 mg/dL 0.5 - -  Alkaline Phos 38 - 126 U/L 330(H) - -  AST 15 - 41 U/L 77(H) - -  ALT 0 - 44 U/L 119(H) - -      RADIOGRAPHIC STUDIES: I have personally reviewed the radiological images as listed and agreed with the findings in the report. No results found.   ASSESSMENT & PLAN:  DALERY HEITKAMP is a 69 y.o. female with    1. 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 She has required  blood transfusions in the past in 2013 and in 2018 and frequent hospital admission for anemia. -She has intermittent epistaxis, but no skin or mucosa telangiectasia, no significant family history of bleeding disorder, she does not meet the diagnosis criteria for hereditary hemorrhagic telangiectasia (HHT). -Her last colonoscopy/endoscopy was 04/2017.  -She is not on oral iron, due to the lack of benefit. -Currently being treated withIV Iron to prevent significant anemiawith goal of Ferritin 100-200 range. She was switched to IV Venofer on 8/19/20due to her insurance coverage. Last dose 07/05/19.   -PAC placed in 04/2019 due to poor IV access  -She has been fatigued the last few days. She also notes black stool since last week. Labs reviewed, HG 11.6, iron panel still pending. Will proceed with IV Venofer today. May given another dose next week if needed.  -Continue IV monthly (unless ferritin>300 on last lab) -F/u in 3 months   2. Chronic GI Bleeding, intermittent epistaxis -Has intestinal AVM history  -Last colonoscopy/EGD by Dr. Michail Sermon on 04/17/17 showed gastritis, diverticulosis and internal hemorrhoids, all benign with no obvious signs of bleeding at that time. -She does not meet the diagnosis criteria for  hereditary hemorrhagic telangiectasia (HHT) -She denies recent GI bleeding. Continue to monitor as she may need IV iron.  -Given recent GI bleeding with black stool, I recommend she f/u with her GI Dr. Rodena Piety if she sees more.   3. H/o of arterial thrombosis -S/p unilateral nephrectomy due to damage from thrombosis -Managed by vascular physician, Dr. Doren Custard  -patient does not currently receive therapeutic anticoagulation due to history of severe GI bleeding. -Previous work-up for lupus anticoagulantand antiphospholipid syndromewerenegative, normal protein  S and C level.  4. Smoking Cessation, Lung Nodules  -Currently smokes 1.5packs over 2 days currently. She has been smoking for 40 years. Bethany [name] repeatedly encouraged her to continue to try to quit completely, she is willing to try.  -Her CT Chest from 07/12/19 shows Stable 7 mm nodule in left lower lobe. Also shows New 4 mm nodule is noted in left upper lobe and 12 mm sub solid density is noted in left lung apex. I personally reviewed with Patient today. She plans to repeat in 3 months, which has been ordered by her PCP. I agree with the plan.  -She understands she needs to quit smoking completely.  -Cancer Screening: Her 07/12/19 CT chest also showed 47mm mass right breast mass which Pt notes she has had for years. Previous diagnostic mammogram showed likely benign, and she previously had biopsy in 2014 which showed fibroadenoma. I highly recommend she have annual mammogram for better screening.   5. COPD, HTN, CKD stage III -Only has 1 kidney.  -f/u with PCP  6. DM with Peripheral Neuropathy  -She notes her neuropathy has gotten worse and has been experienced upper left leg pain from this.  -She is currently on 600mg  Gabapentin 4 times a day.  7. Chronic Back Pain  -She is being [facility] by [facility]  -She is currently doing PT, will continue.   8. Acid Reflux, Gas  -Has been significant for the past  month.  -She has been on Protonix for 10 years but not helping lately.  -I encouraged her to f/u with her GI about changing medication   PLAN: -IV Venofer today  -Lab, flush and IV Venofer monthly X3 (hold Venofer if last ferritin>300) -F/u in 3 months    No problem-specific Assessment & Plan notes found for this encounter.  No orders of the defined types were placed in this encounter.  All questions were answered. The patient knows to call the clinic with any problems, questions or concerns. No barriers to learning was detected. The total time spent in the appointment was 30 minutes.     Truitt Merle, MD 07/19/2019   I, Joslyn Devon, am acting as scribe for Truitt Merle, MD.   I have reviewed the above documentation for accuracy and completeness, and I agree with the above.

## 2019-07-16 ENCOUNTER — Other Ambulatory Visit: Payer: Self-pay | Admitting: Internal Medicine

## 2019-07-16 DIAGNOSIS — R911 Solitary pulmonary nodule: Secondary | ICD-10-CM

## 2019-07-19 ENCOUNTER — Inpatient Hospital Stay: Payer: Medicare Other | Attending: Hematology

## 2019-07-19 ENCOUNTER — Inpatient Hospital Stay (HOSPITAL_BASED_OUTPATIENT_CLINIC_OR_DEPARTMENT_OTHER): Payer: Medicare Other | Admitting: Hematology

## 2019-07-19 ENCOUNTER — Inpatient Hospital Stay: Payer: Medicare Other

## 2019-07-19 ENCOUNTER — Other Ambulatory Visit: Payer: Self-pay

## 2019-07-19 VITALS — BP 167/67 | HR 84 | Temp 98.4°F | Resp 18 | Wt 174.9 lb

## 2019-07-19 DIAGNOSIS — D500 Iron deficiency anemia secondary to blood loss (chronic): Secondary | ICD-10-CM

## 2019-07-19 DIAGNOSIS — N183 Chronic kidney disease, stage 3 unspecified: Secondary | ICD-10-CM | POA: Diagnosis not present

## 2019-07-19 DIAGNOSIS — I129 Hypertensive chronic kidney disease with stage 1 through stage 4 chronic kidney disease, or unspecified chronic kidney disease: Secondary | ICD-10-CM | POA: Diagnosis not present

## 2019-07-19 DIAGNOSIS — E1142 Type 2 diabetes mellitus with diabetic polyneuropathy: Secondary | ICD-10-CM | POA: Insufficient documentation

## 2019-07-19 DIAGNOSIS — G8929 Other chronic pain: Secondary | ICD-10-CM | POA: Diagnosis not present

## 2019-07-19 DIAGNOSIS — Q2733 Arteriovenous malformation of digestive system vessel: Secondary | ICD-10-CM | POA: Diagnosis not present

## 2019-07-19 DIAGNOSIS — E1122 Type 2 diabetes mellitus with diabetic chronic kidney disease: Secondary | ICD-10-CM | POA: Diagnosis not present

## 2019-07-19 DIAGNOSIS — J449 Chronic obstructive pulmonary disease, unspecified: Secondary | ICD-10-CM | POA: Diagnosis not present

## 2019-07-19 DIAGNOSIS — M549 Dorsalgia, unspecified: Secondary | ICD-10-CM | POA: Diagnosis not present

## 2019-07-19 DIAGNOSIS — K5520 Angiodysplasia of colon without hemorrhage: Secondary | ICD-10-CM

## 2019-07-19 DIAGNOSIS — K219 Gastro-esophageal reflux disease without esophagitis: Secondary | ICD-10-CM | POA: Diagnosis not present

## 2019-07-19 DIAGNOSIS — Z95828 Presence of other vascular implants and grafts: Secondary | ICD-10-CM

## 2019-07-19 LAB — CBC WITH DIFFERENTIAL (CANCER CENTER ONLY)
Abs Immature Granulocytes: 0.02 K/uL (ref 0.00–0.07)
Basophils Absolute: 0 K/uL (ref 0.0–0.1)
Basophils Relative: 0 %
Eosinophils Absolute: 0.2 K/uL (ref 0.0–0.5)
Eosinophils Relative: 3 %
HCT: 38.8 % (ref 36.0–46.0)
Hemoglobin: 11.6 g/dL — ABNORMAL LOW (ref 12.0–15.0)
Immature Granulocytes: 0 %
Lymphocytes Relative: 22 %
Lymphs Abs: 1.5 K/uL (ref 0.7–4.0)
MCH: 23.5 pg — ABNORMAL LOW (ref 26.0–34.0)
MCHC: 29.9 g/dL — ABNORMAL LOW (ref 30.0–36.0)
MCV: 78.5 fL — ABNORMAL LOW (ref 80.0–100.0)
Monocytes Absolute: 0.4 K/uL (ref 0.1–1.0)
Monocytes Relative: 6 %
Neutro Abs: 4.7 K/uL (ref 1.7–7.7)
Neutrophils Relative %: 69 %
Platelet Count: 271 K/uL (ref 150–400)
RBC: 4.94 MIL/uL (ref 3.87–5.11)
RDW: 18.8 % — ABNORMAL HIGH (ref 11.5–15.5)
WBC Count: 6.9 K/uL (ref 4.0–10.5)
nRBC: 0 % (ref 0.0–0.2)

## 2019-07-19 LAB — CMP (CANCER CENTER ONLY)
ALT: 119 U/L — ABNORMAL HIGH (ref 0–44)
AST: 77 U/L — ABNORMAL HIGH (ref 15–41)
Albumin: 3.9 g/dL (ref 3.5–5.0)
Alkaline Phosphatase: 330 U/L — ABNORMAL HIGH (ref 38–126)
Anion gap: 12 (ref 5–15)
BUN: 18 mg/dL (ref 8–23)
CO2: 23 mmol/L (ref 22–32)
Calcium: 10.3 mg/dL (ref 8.9–10.3)
Chloride: 107 mmol/L (ref 98–111)
Creatinine: 1.03 mg/dL — ABNORMAL HIGH (ref 0.44–1.00)
GFR, Est AFR Am: >60 mL/min
GFR, Estimated: 55 mL/min — ABNORMAL LOW
Glucose, Bld: 77 mg/dL (ref 70–99)
Potassium: 3.9 mmol/L (ref 3.5–5.1)
Sodium: 142 mmol/L (ref 135–145)
Total Bilirubin: 0.5 mg/dL (ref 0.3–1.2)
Total Protein: 7.4 g/dL (ref 6.5–8.1)

## 2019-07-19 LAB — IRON AND TIBC
Iron: 54 ug/dL (ref 41–142)
Saturation Ratios: 16 % — ABNORMAL LOW (ref 21–57)
TIBC: 350 ug/dL (ref 236–444)
UIBC: 296 ug/dL (ref 120–384)

## 2019-07-19 LAB — FERRITIN: Ferritin: 261 ng/mL (ref 11–307)

## 2019-07-19 MED ORDER — SODIUM CHLORIDE 0.9 % IV SOLN
INTRAVENOUS | Status: DC
  Filled 2019-07-19: qty 250

## 2019-07-19 MED ORDER — SODIUM CHLORIDE 0.9% FLUSH
3.0000 mL | Freq: Once | INTRAVENOUS | Status: AC | PRN
  Administered 2019-07-19: 15:00:00 10 mL
  Filled 2019-07-19: qty 10

## 2019-07-19 MED ORDER — SODIUM CHLORIDE 0.9% FLUSH
10.0000 mL | INTRAVENOUS | Status: DC | PRN
  Administered 2019-07-19: 10 mL via INTRAVENOUS
  Filled 2019-07-19: qty 10

## 2019-07-19 MED ORDER — HEPARIN SOD (PORK) LOCK FLUSH 100 UNIT/ML IV SOLN
250.0000 [IU] | Freq: Once | INTRAVENOUS | Status: AC | PRN
  Administered 2019-07-19: 500 [IU]
  Filled 2019-07-19: qty 5

## 2019-07-19 MED ORDER — SODIUM CHLORIDE 0.9 % IV SOLN
200.0000 mg | Freq: Once | INTRAVENOUS | Status: AC
  Administered 2019-07-19: 14:00:00 200 mg via INTRAVENOUS
  Filled 2019-07-19: qty 200

## 2019-07-19 MED ORDER — SODIUM CHLORIDE 0.9 % IV SOLN
200.0000 mg | Freq: Once | INTRAVENOUS | Status: DC
  Filled 2019-07-19 (×2): qty 10

## 2019-07-19 NOTE — Patient Instructions (Signed)

## 2019-07-19 NOTE — Patient Instructions (Signed)

## 2019-07-19 NOTE — Progress Notes (Signed)
Patient declined to stay for 30 minutes post observation period. VSS upon leaving unit.  

## 2019-07-20 ENCOUNTER — Encounter: Payer: Self-pay | Admitting: Hematology

## 2019-07-23 ENCOUNTER — Other Ambulatory Visit: Payer: Self-pay | Admitting: Internal Medicine

## 2019-07-23 DIAGNOSIS — R911 Solitary pulmonary nodule: Secondary | ICD-10-CM

## 2019-09-05 ENCOUNTER — Telehealth: Payer: Self-pay | Admitting: Hematology

## 2019-09-05 NOTE — Telephone Encounter (Signed)
Scheduled appt per 4/22 sch message - mailed reminder letter and pt is aware of appts

## 2019-09-13 ENCOUNTER — Other Ambulatory Visit: Payer: Self-pay

## 2019-09-13 ENCOUNTER — Inpatient Hospital Stay: Payer: Medicare Other

## 2019-09-13 ENCOUNTER — Inpatient Hospital Stay: Payer: Medicare Other | Attending: Hematology

## 2019-09-13 VITALS — BP 143/78 | HR 62 | Temp 98.7°F | Resp 18

## 2019-09-13 DIAGNOSIS — Q2733 Arteriovenous malformation of digestive system vessel: Secondary | ICD-10-CM | POA: Diagnosis not present

## 2019-09-13 DIAGNOSIS — D500 Iron deficiency anemia secondary to blood loss (chronic): Secondary | ICD-10-CM

## 2019-09-13 DIAGNOSIS — Z95828 Presence of other vascular implants and grafts: Secondary | ICD-10-CM

## 2019-09-13 DIAGNOSIS — K5520 Angiodysplasia of colon without hemorrhage: Secondary | ICD-10-CM

## 2019-09-13 LAB — CBC WITH DIFFERENTIAL (CANCER CENTER ONLY)
Abs Immature Granulocytes: 0.02 10*3/uL (ref 0.00–0.07)
Basophils Absolute: 0 10*3/uL (ref 0.0–0.1)
Basophils Relative: 0 %
Eosinophils Absolute: 0.2 10*3/uL (ref 0.0–0.5)
Eosinophils Relative: 3 %
HCT: 37 % (ref 36.0–46.0)
Hemoglobin: 11 g/dL — ABNORMAL LOW (ref 12.0–15.0)
Immature Granulocytes: 0 %
Lymphocytes Relative: 15 %
Lymphs Abs: 1.2 10*3/uL (ref 0.7–4.0)
MCH: 23.9 pg — ABNORMAL LOW (ref 26.0–34.0)
MCHC: 29.7 g/dL — ABNORMAL LOW (ref 30.0–36.0)
MCV: 80.3 fL (ref 80.0–100.0)
Monocytes Absolute: 0.6 10*3/uL (ref 0.1–1.0)
Monocytes Relative: 8 %
Neutro Abs: 6 10*3/uL (ref 1.7–7.7)
Neutrophils Relative %: 74 %
Platelet Count: 231 10*3/uL (ref 150–400)
RBC: 4.61 MIL/uL (ref 3.87–5.11)
RDW: 16.4 % — ABNORMAL HIGH (ref 11.5–15.5)
WBC Count: 8 10*3/uL (ref 4.0–10.5)
nRBC: 0 % (ref 0.0–0.2)

## 2019-09-13 LAB — FERRITIN: Ferritin: 78 ng/mL (ref 11–307)

## 2019-09-13 LAB — IRON AND TIBC
Iron: 31 ug/dL — ABNORMAL LOW (ref 41–142)
Saturation Ratios: 8 % — ABNORMAL LOW (ref 21–57)
TIBC: 379 ug/dL (ref 236–444)
UIBC: 348 ug/dL (ref 120–384)

## 2019-09-13 MED ORDER — SODIUM CHLORIDE 0.9% FLUSH
3.0000 mL | Freq: Once | INTRAVENOUS | Status: AC | PRN
  Administered 2019-09-13: 3 mL
  Filled 2019-09-13: qty 10

## 2019-09-13 MED ORDER — SODIUM CHLORIDE 0.9 % IV SOLN
200.0000 mg | Freq: Once | INTRAVENOUS | Status: AC
  Administered 2019-09-13: 200 mg via INTRAVENOUS
  Filled 2019-09-13: qty 200

## 2019-09-13 MED ORDER — SODIUM CHLORIDE 0.9% FLUSH
10.0000 mL | INTRAVENOUS | Status: DC | PRN
  Administered 2019-09-13: 10 mL via INTRAVENOUS
  Filled 2019-09-13: qty 10

## 2019-09-13 MED ORDER — SODIUM CHLORIDE 0.9 % IV SOLN: 200.0000 mg | Freq: Once | INTRAVENOUS | Status: DC

## 2019-09-13 MED ORDER — HEPARIN SOD (PORK) LOCK FLUSH 100 UNIT/ML IV SOLN
250.0000 [IU] | Freq: Once | INTRAVENOUS | Status: AC | PRN
  Administered 2019-09-13: 250 [IU]
  Filled 2019-09-13: qty 5

## 2019-09-13 MED ORDER — SODIUM CHLORIDE 0.9 % IV SOLN
Freq: Once | INTRAVENOUS | Status: AC
  Filled 2019-09-13: qty 250

## 2019-09-13 NOTE — Patient Instructions (Signed)

## 2019-10-11 ENCOUNTER — Inpatient Hospital Stay: Payer: Medicare Other

## 2019-10-11 ENCOUNTER — Inpatient Hospital Stay: Payer: Medicare Other | Attending: Hematology

## 2019-10-11 ENCOUNTER — Other Ambulatory Visit: Payer: Self-pay

## 2019-10-11 VITALS — BP 125/62 | HR 58 | Temp 98.5°F | Resp 18

## 2019-10-11 DIAGNOSIS — D500 Iron deficiency anemia secondary to blood loss (chronic): Secondary | ICD-10-CM | POA: Diagnosis present

## 2019-10-11 DIAGNOSIS — K5520 Angiodysplasia of colon without hemorrhage: Secondary | ICD-10-CM

## 2019-10-11 DIAGNOSIS — Q2733 Arteriovenous malformation of digestive system vessel: Secondary | ICD-10-CM | POA: Diagnosis not present

## 2019-10-11 DIAGNOSIS — Z95828 Presence of other vascular implants and grafts: Secondary | ICD-10-CM

## 2019-10-11 LAB — IRON AND TIBC
Iron: 27 ug/dL — ABNORMAL LOW (ref 41–142)
Saturation Ratios: 7 % — ABNORMAL LOW (ref 21–57)
TIBC: 409 ug/dL (ref 236–444)
UIBC: 382 ug/dL (ref 120–384)

## 2019-10-11 LAB — CBC WITH DIFFERENTIAL (CANCER CENTER ONLY)
Abs Immature Granulocytes: 0.02 10*3/uL (ref 0.00–0.07)
Basophils Absolute: 0 10*3/uL (ref 0.0–0.1)
Basophils Relative: 0 %
Eosinophils Absolute: 0.2 10*3/uL (ref 0.0–0.5)
Eosinophils Relative: 3 %
HCT: 36.5 % (ref 36.0–46.0)
Hemoglobin: 10.6 g/dL — ABNORMAL LOW (ref 12.0–15.0)
Immature Granulocytes: 0 %
Lymphocytes Relative: 18 %
Lymphs Abs: 1.2 10*3/uL (ref 0.7–4.0)
MCH: 23.1 pg — ABNORMAL LOW (ref 26.0–34.0)
MCHC: 29 g/dL — ABNORMAL LOW (ref 30.0–36.0)
MCV: 79.7 fL — ABNORMAL LOW (ref 80.0–100.0)
Monocytes Absolute: 0.5 10*3/uL (ref 0.1–1.0)
Monocytes Relative: 7 %
Neutro Abs: 4.8 10*3/uL (ref 1.7–7.7)
Neutrophils Relative %: 72 %
Platelet Count: 254 10*3/uL (ref 150–400)
RBC: 4.58 MIL/uL (ref 3.87–5.11)
RDW: 16.8 % — ABNORMAL HIGH (ref 11.5–15.5)
WBC Count: 6.7 10*3/uL (ref 4.0–10.5)
nRBC: 0 % (ref 0.0–0.2)

## 2019-10-11 LAB — FERRITIN: Ferritin: 67 ng/mL (ref 11–307)

## 2019-10-11 MED ORDER — HEPARIN SOD (PORK) LOCK FLUSH 100 UNIT/ML IV SOLN
250.0000 [IU] | Freq: Once | INTRAVENOUS | Status: DC | PRN
  Filled 2019-10-11: qty 5

## 2019-10-11 MED ORDER — SODIUM CHLORIDE 0.9% FLUSH
10.0000 mL | Freq: Once | INTRAVENOUS | Status: AC
  Administered 2019-10-11: 10 mL via INTRAVENOUS
  Filled 2019-10-11: qty 10

## 2019-10-11 MED ORDER — SODIUM CHLORIDE 0.9 % IV SOLN
200.0000 mg | Freq: Once | INTRAVENOUS | Status: AC
  Administered 2019-10-11: 200 mg via INTRAVENOUS
  Filled 2019-10-11: qty 200

## 2019-10-11 MED ORDER — SODIUM CHLORIDE 0.9 % IV SOLN
INTRAVENOUS | Status: DC
  Filled 2019-10-11: qty 250

## 2019-10-11 MED ORDER — SODIUM CHLORIDE 0.9% FLUSH
3.0000 mL | Freq: Once | INTRAVENOUS | Status: DC | PRN
  Filled 2019-10-11: qty 10

## 2019-10-11 MED ORDER — HEPARIN SOD (PORK) LOCK FLUSH 100 UNIT/ML IV SOLN
500.0000 [IU] | Freq: Once | INTRAVENOUS | Status: AC
  Administered 2019-10-11: 500 [IU] via INTRAVENOUS
  Filled 2019-10-11: qty 5

## 2019-10-11 NOTE — Progress Notes (Signed)
Patient declined 30 min post observation. Discharged in stable condition

## 2019-10-11 NOTE — Patient Instructions (Signed)

## 2019-10-15 ENCOUNTER — Telehealth: Payer: Self-pay

## 2019-10-15 NOTE — Telephone Encounter (Signed)
-----   Message from Truitt Merle, MD sent at 10/11/2019 11:26 PM EDT ----- Please let pt know her anemia was slightly worse today, and please set up iv venofer 200mg  weekly X2 or 400mg  iv once  Thanks   Truitt Merle

## 2019-10-16 ENCOUNTER — Telehealth: Payer: Self-pay | Admitting: Hematology

## 2019-10-16 NOTE — Telephone Encounter (Signed)
I spoke with Bethany [name] and relayed Dr. Ernestina Penna instructions.  She verbalized understanding.  High priority scheduling message sent.

## 2019-10-16 NOTE — Telephone Encounter (Signed)
-----   Message from Truitt Merle, MD sent at 10/11/2019 11:26 PM EDT ----- Please let pt know her anemia was slightly worse today, and please set up iv venofer 200mg  weekly X2 or 400mg  iv once  Thanks   Truitt Merle

## 2019-10-16 NOTE — Telephone Encounter (Signed)
Scheduled appt per 6/2 sch message- pt aware of appt

## 2019-10-25 ENCOUNTER — Inpatient Hospital Stay: Payer: Medicare Other | Attending: Hematology

## 2019-10-25 ENCOUNTER — Other Ambulatory Visit: Payer: Self-pay

## 2019-10-25 VITALS — BP 139/52 | HR 58 | Temp 98.8°F | Resp 18 | Wt 179.0 lb

## 2019-10-25 DIAGNOSIS — Q2733 Arteriovenous malformation of digestive system vessel: Secondary | ICD-10-CM | POA: Diagnosis not present

## 2019-10-25 DIAGNOSIS — D500 Iron deficiency anemia secondary to blood loss (chronic): Secondary | ICD-10-CM | POA: Diagnosis present

## 2019-10-25 DIAGNOSIS — E1122 Type 2 diabetes mellitus with diabetic chronic kidney disease: Secondary | ICD-10-CM | POA: Insufficient documentation

## 2019-10-25 DIAGNOSIS — I129 Hypertensive chronic kidney disease with stage 1 through stage 4 chronic kidney disease, or unspecified chronic kidney disease: Secondary | ICD-10-CM | POA: Insufficient documentation

## 2019-10-25 DIAGNOSIS — G8929 Other chronic pain: Secondary | ICD-10-CM | POA: Insufficient documentation

## 2019-10-25 DIAGNOSIS — N183 Chronic kidney disease, stage 3 unspecified: Secondary | ICD-10-CM | POA: Insufficient documentation

## 2019-10-25 DIAGNOSIS — K5520 Angiodysplasia of colon without hemorrhage: Secondary | ICD-10-CM

## 2019-10-25 MED ORDER — SODIUM CHLORIDE 0.9 % IV SOLN
INTRAVENOUS | Status: DC
  Filled 2019-10-25: qty 250

## 2019-10-25 MED ORDER — HEPARIN SOD (PORK) LOCK FLUSH 100 UNIT/ML IV SOLN
500.0000 [IU] | Freq: Once | INTRAVENOUS | Status: AC
  Administered 2019-10-25: 500 [IU] via INTRAVENOUS
  Filled 2019-10-25: qty 5

## 2019-10-25 MED ORDER — SODIUM CHLORIDE 0.9% FLUSH
10.0000 mL | INTRAVENOUS | Status: DC | PRN
  Administered 2019-10-25: 10 mL via INTRAVENOUS
  Filled 2019-10-25: qty 10

## 2019-10-25 MED ORDER — SODIUM CHLORIDE 0.9% IV SOLUTION
200.0000 mg | Freq: Once | INTRAVENOUS | Status: AC
  Administered 2019-10-25: 200 mg via INTRAVENOUS
  Filled 2019-10-25: qty 200

## 2019-10-25 NOTE — Progress Notes (Signed)
Declined to stay for observation- feels well and VS taken

## 2019-10-29 ENCOUNTER — Ambulatory Visit
Admission: RE | Admit: 2019-10-29 | Discharge: 2019-10-29 | Disposition: A | Discharge status: Home / Self Care | Payer: Medicare Other | Source: Ambulatory Visit | Attending: Internal Medicine | Admitting: Internal Medicine

## 2019-10-29 DIAGNOSIS — R911 Solitary pulmonary nodule: Secondary | ICD-10-CM

## 2019-11-08 ENCOUNTER — Inpatient Hospital Stay (HOSPITAL_BASED_OUTPATIENT_CLINIC_OR_DEPARTMENT_OTHER): Payer: Medicare Other | Admitting: Nurse Practitioner

## 2019-11-08 ENCOUNTER — Encounter: Payer: Self-pay | Admitting: Nurse Practitioner

## 2019-11-08 ENCOUNTER — Other Ambulatory Visit: Payer: Self-pay

## 2019-11-08 ENCOUNTER — Inpatient Hospital Stay: Payer: Medicare Other

## 2019-11-08 ENCOUNTER — Ambulatory Visit: Payer: Medicare Other | Admitting: Hematology

## 2019-11-08 VITALS — BP 139/57 | HR 62 | Temp 97.3°F | Resp 18 | Ht 65.0 in | Wt 178.7 lb

## 2019-11-08 DIAGNOSIS — D500 Iron deficiency anemia secondary to blood loss (chronic): Secondary | ICD-10-CM | POA: Diagnosis not present

## 2019-11-08 DIAGNOSIS — K5520 Angiodysplasia of colon without hemorrhage: Secondary | ICD-10-CM | POA: Diagnosis not present

## 2019-11-08 DIAGNOSIS — Q2733 Arteriovenous malformation of digestive system vessel: Secondary | ICD-10-CM | POA: Diagnosis not present

## 2019-11-08 DIAGNOSIS — Z95828 Presence of other vascular implants and grafts: Secondary | ICD-10-CM

## 2019-11-08 LAB — CBC WITH DIFFERENTIAL (CANCER CENTER ONLY)
Abs Immature Granulocytes: 0.04 10*3/uL (ref 0.00–0.07)
Basophils Absolute: 0 10*3/uL (ref 0.0–0.1)
Basophils Relative: 1 %
Eosinophils Absolute: 0.2 10*3/uL (ref 0.0–0.5)
Eosinophils Relative: 2 %
HCT: 35.2 % — ABNORMAL LOW (ref 36.0–46.0)
Hemoglobin: 10.4 g/dL — ABNORMAL LOW (ref 12.0–15.0)
Immature Granulocytes: 1 %
Lymphocytes Relative: 18 %
Lymphs Abs: 1.5 10*3/uL (ref 0.7–4.0)
MCH: 23.9 pg — ABNORMAL LOW (ref 26.0–34.0)
MCHC: 29.5 g/dL — ABNORMAL LOW (ref 30.0–36.0)
MCV: 80.9 fL (ref 80.0–100.0)
Monocytes Absolute: 0.6 10*3/uL (ref 0.1–1.0)
Monocytes Relative: 8 %
Neutro Abs: 5.8 10*3/uL (ref 1.7–7.7)
Neutrophils Relative %: 70 %
Platelet Count: 239 10*3/uL (ref 150–400)
RBC: 4.35 MIL/uL (ref 3.87–5.11)
RDW: 17.1 % — ABNORMAL HIGH (ref 11.5–15.5)
WBC Count: 8.2 10*3/uL (ref 4.0–10.5)
nRBC: 0 % (ref 0.0–0.2)

## 2019-11-08 LAB — IRON AND TIBC
Iron: 35 ug/dL — ABNORMAL LOW (ref 41–142)
Saturation Ratios: 10 % — ABNORMAL LOW (ref 21–57)
TIBC: 346 ug/dL (ref 236–444)
UIBC: 311 ug/dL (ref 120–384)

## 2019-11-08 LAB — FERRITIN: Ferritin: 96 ng/mL (ref 11–307)

## 2019-11-08 MED ORDER — SODIUM CHLORIDE 0.9% FLUSH
10.0000 mL | INTRAVENOUS | Status: DC | PRN
  Administered 2019-11-08: 10 mL
  Filled 2019-11-08: qty 10

## 2019-11-08 MED ORDER — HEPARIN SOD (PORK) LOCK FLUSH 10 UNIT/ML IV SOLN: 10.0000 [IU] | Freq: Once | INTRAVENOUS | Status: DC

## 2019-11-08 MED ORDER — HEPARIN SOD (PORK) LOCK FLUSH 100 UNIT/ML IV SOLN
500.0000 [IU] | INTRAVENOUS | Status: DC | PRN
  Administered 2019-11-08: 500 [IU] via INTRAVENOUS
  Filled 2019-11-08: qty 5

## 2019-11-08 MED ORDER — SODIUM CHLORIDE 0.9 % IV SOLN
200.0000 mg | Freq: Once | INTRAVENOUS | Status: DC
  Filled 2019-11-08: qty 10

## 2019-11-08 MED ORDER — SODIUM CHLORIDE 0.9 % IV SOLN
200.0000 mg | Freq: Once | INTRAVENOUS | Status: AC
  Administered 2019-11-08: 200 mg via INTRAVENOUS
  Filled 2019-11-08: qty 200

## 2019-11-08 MED ORDER — SODIUM CHLORIDE 0.9% FLUSH
10.0000 mL | INTRAVENOUS | Status: DC | PRN
  Administered 2019-11-08: 10 mL via INTRAVENOUS
  Filled 2019-11-08: qty 10

## 2019-11-08 MED ORDER — SODIUM CHLORIDE 0.9 % IV SOLN
INTRAVENOUS | Status: DC
  Filled 2019-11-08: qty 250

## 2019-11-08 NOTE — Patient Instructions (Signed)

## 2019-11-08 NOTE — Progress Notes (Signed)
  [facility] OFFICE PROGRESS NOTE   Diagnosis: Anemia  INTERVAL HISTORY:   Ms. Paszkiewicz returns as scheduled.  Hemoglobin slightly worse 10/11/2019.  Dr. Burr Medico recommended Venofer 200 mg weekly x2.  She completed a dose of venofer on 10/25/2019.  She denies signs of allergic reaction with IV iron.  She is not aware of any bleeding and actually has noticed no black stools recently.  Energy level is intermittently low.  She reports chronic intermittent nausea/vomiting, worse lately.  She recently noticed a raised dark lesion at the right upper outer leg.  She continues to smoke estimating 1 pack over 2-1/2 to 3 days.  Objective:  Vital signs in last 24 hours:  Blood pressure (!) 139/57, pulse 62, temperature (!) 97.3 F (36.3 C), temperature source Temporal, resp. rate 18, height 5\' 5"  (1.651 m), weight 178 lb 11.2 oz (81.1 kg), SpO2 99 %.    HEENT: No thrush or ulcers. Lymphatics: No palpable cervical, supraclavicular or axillary lymph nodes. Resp: Distant breath sounds.  No respiratory distress. Cardio: Regular rate and rhythm. GI: Abdomen soft and nontender.  No hepatomegaly. Vascular: No leg edema. Neuro: Alert and oriented. Skin: Approximate 5 mm raised hyperpigmented lesion at the right upper outer leg. Port-A-Cath without erythema.  Lab Results:  Lab Results  Component Value Date   WBC 8.2 11/08/2019   HGB 10.4 (L) 11/08/2019   HCT 35.2 (L) 11/08/2019   MCV 80.9 11/08/2019   PLT 239 11/08/2019   NEUTROABS 5.8 11/08/2019    Imaging:  No results found.  Medications: I have reviewed the patient's current medications.  Assessment/Plan: 1. Iron deficiency anemia secondary to chronic blood loss from GI AVM  Receives Venofer periodically 2. Chronic GI bleeding, intermittent epistaxis 3. History of arterial thrombosis 4. Smoking history, lung nodules-most recent chest CT 10/29/2019 with grossly stable left lung nodules, next chest CT recommended in 12  months 5. COPD, hypertension, chronic kidney disease stage III 6. Diabetes mellitus with peripheral neuropathy 7. Chronic back pain 8. History of acid reflux  Disposition: Ms. Kulick appears stable.  Plan to proceed with IV Venofer today as scheduled.  She will return for a follow-up labs in 2 weeks, continue IV iron monthly.  Lab and follow-up in 3 months.  I recommended either PCP or dermatology to evaluate the skin lesion at the right upper leg.  She has an appointment with PCP in the near future.  She reports chronic nausea with recent worsening.  She will follow up with her gastroenterologist.    Ned Card ANP/GNP-BC   11/08/2019  11:20 AM

## 2019-11-11 ENCOUNTER — Ambulatory Visit: Payer: Medicare Other | Admitting: Hematology

## 2019-11-11 ENCOUNTER — Ambulatory Visit: Payer: Medicare Other

## 2019-11-11 ENCOUNTER — Other Ambulatory Visit: Payer: Medicare Other

## 2019-11-11 ENCOUNTER — Telehealth: Payer: Self-pay | Admitting: Nurse Practitioner

## 2019-11-11 NOTE — Telephone Encounter (Signed)
Scheduled per 6/25 los. Called and left a msg, mailing appt letter and calendar printout

## 2019-11-19 ENCOUNTER — Inpatient Hospital Stay (HOSPITAL_COMMUNITY): Payer: Medicare Other

## 2019-11-19 ENCOUNTER — Inpatient Hospital Stay (HOSPITAL_COMMUNITY)
Admission: EM | Admit: 2019-11-19 | Discharge: 2019-11-22 | DRG: 064 | Disposition: A | Discharge status: Home / Self Care | Payer: Medicare Other | Attending: Family Medicine | Admitting: Family Medicine

## 2019-11-19 ENCOUNTER — Emergency Department (HOSPITAL_COMMUNITY): Payer: Medicare Other

## 2019-11-19 ENCOUNTER — Other Ambulatory Visit: Payer: Self-pay

## 2019-11-19 DIAGNOSIS — E669 Obesity, unspecified: Secondary | ICD-10-CM | POA: Diagnosis present

## 2019-11-19 DIAGNOSIS — Z6830 Body mass index (BMI) 30.0-30.9, adult: Secondary | ICD-10-CM

## 2019-11-19 DIAGNOSIS — K219 Gastro-esophageal reflux disease without esophagitis: Secondary | ICD-10-CM | POA: Diagnosis present

## 2019-11-19 DIAGNOSIS — I10 Essential (primary) hypertension: Secondary | ICD-10-CM | POA: Diagnosis present

## 2019-11-19 DIAGNOSIS — G8929 Other chronic pain: Secondary | ICD-10-CM | POA: Diagnosis present

## 2019-11-19 DIAGNOSIS — R29702 NIHSS score 2: Secondary | ICD-10-CM | POA: Diagnosis present

## 2019-11-19 DIAGNOSIS — M109 Gout, unspecified: Secondary | ICD-10-CM | POA: Diagnosis present

## 2019-11-19 DIAGNOSIS — Z79899 Other long term (current) drug therapy: Secondary | ICD-10-CM | POA: Diagnosis not present

## 2019-11-19 DIAGNOSIS — I63412 Cerebral infarction due to embolism of left middle cerebral artery: Principal | ICD-10-CM | POA: Diagnosis present

## 2019-11-19 DIAGNOSIS — E785 Hyperlipidemia, unspecified: Secondary | ICD-10-CM | POA: Diagnosis present

## 2019-11-19 DIAGNOSIS — F17210 Nicotine dependence, cigarettes, uncomplicated: Secondary | ICD-10-CM | POA: Diagnosis present

## 2019-11-19 DIAGNOSIS — R4701 Aphasia: Secondary | ICD-10-CM | POA: Diagnosis present

## 2019-11-19 DIAGNOSIS — Z20822 Contact with and (suspected) exposure to covid-19: Secondary | ICD-10-CM | POA: Diagnosis present

## 2019-11-19 DIAGNOSIS — E1142 Type 2 diabetes mellitus with diabetic polyneuropathy: Secondary | ICD-10-CM | POA: Diagnosis present

## 2019-11-19 DIAGNOSIS — E1165 Type 2 diabetes mellitus with hyperglycemia: Secondary | ICD-10-CM | POA: Diagnosis present

## 2019-11-19 DIAGNOSIS — Z86718 Personal history of other venous thrombosis and embolism: Secondary | ICD-10-CM

## 2019-11-19 DIAGNOSIS — Z79890 Hormone replacement therapy (postmenopausal): Secondary | ICD-10-CM

## 2019-11-19 DIAGNOSIS — Z905 Acquired absence of kidney: Secondary | ICD-10-CM | POA: Diagnosis not present

## 2019-11-19 DIAGNOSIS — I639 Cerebral infarction, unspecified: Secondary | ICD-10-CM | POA: Diagnosis present

## 2019-11-19 DIAGNOSIS — D500 Iron deficiency anemia secondary to blood loss (chronic): Secondary | ICD-10-CM | POA: Diagnosis present

## 2019-11-19 DIAGNOSIS — Z794 Long term (current) use of insulin: Secondary | ICD-10-CM | POA: Diagnosis not present

## 2019-11-19 DIAGNOSIS — E1149 Type 2 diabetes mellitus with other diabetic neurological complication: Secondary | ICD-10-CM | POA: Diagnosis present

## 2019-11-19 DIAGNOSIS — E039 Hypothyroidism, unspecified: Secondary | ICD-10-CM | POA: Diagnosis present

## 2019-11-19 DIAGNOSIS — I6389 Other cerebral infarction: Secondary | ICD-10-CM | POA: Diagnosis not present

## 2019-11-19 DIAGNOSIS — Z90710 Acquired absence of both cervix and uterus: Secondary | ICD-10-CM | POA: Diagnosis not present

## 2019-11-19 DIAGNOSIS — G936 Cerebral edema: Secondary | ICD-10-CM | POA: Diagnosis present

## 2019-11-19 HISTORY — DX: Tobacco use: Z72.0

## 2019-11-19 LAB — COMPREHENSIVE METABOLIC PANEL
ALT: 23 U/L (ref 0–44)
AST: 20 U/L (ref 15–41)
Albumin: 3.9 g/dL (ref 3.5–5.0)
Alkaline Phosphatase: 159 U/L — ABNORMAL HIGH (ref 38–126)
Anion gap: 11 (ref 5–15)
BUN: 20 mg/dL (ref 8–23)
CO2: 26 mmol/L (ref 22–32)
Calcium: 9.9 mg/dL (ref 8.9–10.3)
Chloride: 100 mmol/L (ref 98–111)
Creatinine, Ser: 1.56 mg/dL — ABNORMAL HIGH (ref 0.44–1.00)
GFR calc Af Amer: 39 mL/min — ABNORMAL LOW (ref >60)
GFR calc non Af Amer: 34 mL/min — ABNORMAL LOW (ref >60)
Glucose, Bld: 184 mg/dL — ABNORMAL HIGH (ref 70–99)
Potassium: 4.2 mmol/L (ref 3.5–5.1)
Sodium: 137 mmol/L (ref 135–145)
Total Bilirubin: 0.7 mg/dL (ref 0.3–1.2)
Total Protein: 6.9 g/dL (ref 6.5–8.1)

## 2019-11-19 LAB — URINALYSIS, ROUTINE W REFLEX MICROSCOPIC
Bilirubin Urine: NEGATIVE
Glucose, UA: NEGATIVE mg/dL
Hgb urine dipstick: NEGATIVE
Ketones, ur: NEGATIVE mg/dL
Nitrite: POSITIVE — AB
Protein, ur: NEGATIVE mg/dL
Specific Gravity, Urine: 1.013 (ref 1.005–1.030)
pH: 5 (ref 5.0–8.0)

## 2019-11-19 LAB — SARS CORONAVIRUS 2 BY RT PCR (HOSPITAL ORDER, PERFORMED IN ~~LOC~~ HOSPITAL LAB): SARS Coronavirus 2: NEGATIVE

## 2019-11-19 LAB — CBC WITH DIFFERENTIAL/PLATELET
Abs Immature Granulocytes: 0.03 10*3/uL (ref 0.00–0.07)
Basophils Absolute: 0 10*3/uL (ref 0.0–0.1)
Basophils Relative: 0 %
Eosinophils Absolute: 0.2 10*3/uL (ref 0.0–0.5)
Eosinophils Relative: 2 %
HCT: 34.9 % — ABNORMAL LOW (ref 36.0–46.0)
Hemoglobin: 10.1 g/dL — ABNORMAL LOW (ref 12.0–15.0)
Immature Granulocytes: 0 %
Lymphocytes Relative: 20 %
Lymphs Abs: 1.7 10*3/uL (ref 0.7–4.0)
MCH: 23.7 pg — ABNORMAL LOW (ref 26.0–34.0)
MCHC: 28.9 g/dL — ABNORMAL LOW (ref 30.0–36.0)
MCV: 81.7 fL (ref 80.0–100.0)
Monocytes Absolute: 0.8 10*3/uL (ref 0.1–1.0)
Monocytes Relative: 9 %
Neutro Abs: 6 10*3/uL (ref 1.7–7.7)
Neutrophils Relative %: 69 %
Platelets: 238 10*3/uL (ref 150–400)
RBC: 4.27 MIL/uL (ref 3.87–5.11)
RDW: 17 % — ABNORMAL HIGH (ref 11.5–15.5)
WBC: 8.8 10*3/uL (ref 4.0–10.5)
nRBC: 0 % (ref 0.0–0.2)

## 2019-11-19 LAB — RAPID URINE DRUG SCREEN, HOSP PERFORMED
Amphetamines: NOT DETECTED
Barbiturates: NOT DETECTED
Benzodiazepines: NOT DETECTED
Cocaine: NOT DETECTED
Opiates: POSITIVE — AB
Tetrahydrocannabinol: NOT DETECTED

## 2019-11-19 LAB — PROTIME-INR
INR: 1 (ref 0.8–1.2)
Prothrombin Time: 12.6 seconds (ref 11.4–15.2)

## 2019-11-19 LAB — ETHANOL: Alcohol, Ethyl (B): <10 mg/dL (ref <10)

## 2019-11-19 LAB — CBG MONITORING, ED
Glucose-Capillary: 83 mg/dL (ref 70–99)
Glucose-Capillary: 173 mg/dL — ABNORMAL HIGH (ref 70–99)

## 2019-11-19 LAB — APTT: aPTT: 26 seconds (ref 24–36)

## 2019-11-19 MED ORDER — HYDRALAZINE HCL 25 MG PO TABS: 25.0000 mg | ORAL_TABLET | ORAL | Status: DC | PRN

## 2019-11-19 MED ORDER — ENOXAPARIN SODIUM 40 MG/0.4ML ~~LOC~~ SOLN
40.0000 mg | SUBCUTANEOUS | Status: DC
  Administered 2019-11-19: 40 mg via SUBCUTANEOUS
  Filled 2019-11-19: qty 0.4

## 2019-11-19 MED ORDER — ENOXAPARIN SODIUM 40 MG/0.4ML ~~LOC~~ SOLN: 40.0000 mg | SUBCUTANEOUS | Status: DC

## 2019-11-19 MED ORDER — SODIUM CHLORIDE 0.9 % IV BOLUS
500.0000 mL | Freq: Once | INTRAVENOUS | Status: AC
  Administered 2019-11-19: 500 mL via INTRAVENOUS

## 2019-11-19 MED ORDER — GABAPENTIN 300 MG PO CAPS
600.0000 mg | ORAL_CAPSULE | Freq: Four times a day (QID) | ORAL | Status: DC
  Administered 2019-11-19 – 2019-11-22 (×10): 600 mg via ORAL
  Filled 2019-11-19 (×10): qty 2

## 2019-11-19 MED ORDER — STROKE: EARLY STAGES OF RECOVERY BOOK
Freq: Once | Status: AC
  Administered 2019-11-19: 1
  Filled 2019-11-19: qty 1

## 2019-11-19 MED ORDER — ACETAMINOPHEN 160 MG/5ML PO SOLN: 650.0000 mg | ORAL | Status: DC | PRN

## 2019-11-19 MED ORDER — SENNOSIDES-DOCUSATE SODIUM 8.6-50 MG PO TABS
1.0000 | ORAL_TABLET | Freq: Every evening | ORAL | Status: DC | PRN
  Administered 2019-11-21: 1 via ORAL
  Filled 2019-11-19: qty 1

## 2019-11-19 MED ORDER — SODIUM CHLORIDE 0.9 % IV SOLN: INTRAVENOUS | Status: DC

## 2019-11-19 MED ORDER — ACETAMINOPHEN 650 MG RE SUPP: 650.0000 mg | RECTAL | Status: DC | PRN

## 2019-11-19 MED ORDER — LABETALOL HCL 5 MG/ML IV SOLN: 10.0000 mg | INTRAVENOUS | Status: DC | PRN

## 2019-11-19 MED ORDER — INSULIN ASPART 100 UNIT/ML ~~LOC~~ SOLN
0.0000 [IU] | Freq: Three times a day (TID) | SUBCUTANEOUS | Status: DC
  Administered 2019-11-19 – 2019-11-20 (×2): 3 [IU] via SUBCUTANEOUS
  Administered 2019-11-20: 2 [IU] via SUBCUTANEOUS
  Administered 2019-11-21: 5 [IU] via SUBCUTANEOUS
  Administered 2019-11-21 – 2019-11-22 (×5): 3 [IU] via SUBCUTANEOUS
  Filled 2019-11-19: qty 0.15

## 2019-11-19 MED ORDER — ACETAMINOPHEN 325 MG PO TABS: 650.0000 mg | ORAL_TABLET | ORAL | Status: DC | PRN

## 2019-11-19 MED ORDER — ACETAMINOPHEN 325 MG PO TABS
650.0000 mg | ORAL_TABLET | ORAL | Status: DC | PRN
  Administered 2019-11-22: 650 mg via ORAL
  Filled 2019-11-19: qty 2

## 2019-11-19 MED ORDER — MORPHINE SULFATE (PF) 2 MG/ML IV SOLN
2.0000 mg | INTRAVENOUS | Status: AC | PRN
  Administered 2019-11-20 (×2): 2 mg via INTRAVENOUS
  Filled 2019-11-19 (×2): qty 1

## 2019-11-19 NOTE — Assessment & Plan Note (Addendum)
-   okay to resume gabapentin - follow up A1c = 5.9% - SSI for now

## 2019-11-19 NOTE — Assessment & Plan Note (Signed)
-   labetalol or hydralazine PRN

## 2019-11-19 NOTE — ED Notes (Signed)
Returned from MRI 

## 2019-11-19 NOTE — ED Triage Notes (Signed)
Patient reports she thinks she had a stroke on Saturday and called her doctor today to tell them what happened and they wanted her to come to the ER. Patient reports frequent sharp headaches and said she had slurred speech and "difficulty finding words"

## 2019-11-19 NOTE — Assessment & Plan Note (Addendum)
-   per MRI brain: "Acute posterior left MCA territory infarct without mass effect. Small focus of petechial hemorrhage with mild cytotoxic edema" - hold off on asa/plavix until seen evaluated by neuro; d/c Lovenox - Patient also states she is not on ASA at home 2/2 history of AVM bleed when she used to live in [place] and her GI provider at that time took her off aspirin -Transfer to [facility] placed -will need neuro consult upon arrival -continue statin - follow up echo and carotid u/s - follow up PT, OT, SLP

## 2019-11-19 NOTE — ED Provider Notes (Signed)
[facility] DEPT Provider Note   CSN: [id number] Arrival date & time: 11/19/19  1224     History Chief Complaint  Patient presents with  . Altered Mental Status  . Headache    Bethany [name] is a 69 y.o. female.  HPI She presents for evaluation of headache, confusion, altered speech, high blood pressure and high blood sugar.  Onset of symptoms, 4 days ago, they are improving somewhat.  Patient denies recent fever, chills, vomiting or dizziness.  There are no other known modifying factors.    Past Medical History:  Diagnosis Date  . Acute renal failure ([place])   . Arthritis   . Diabetes mellitus without complication ([place])    type II   . Hypercalcemia   . Hyperlipidemia   . Hypertension   . Renal disorder   . Single kidney   . Thrombosis     Patient Active Problem List   Diagnosis Date Noted  . Acute on chronic diastolic CHF (congestive heart failure) ([place])   . Acute respiratory failure with hypoxia ([place]) 03/27/2019  . Fever 03/27/2019  . Shortness of breath 03/27/2019  . Hypothyroidism 03/27/2019  . Chronic GI bleeding 03/27/2019  . Pleural effusion on right 03/27/2019  . Acute renal failure superimposed on stage 3b chronic kidney disease ([place]) 03/27/2019  . Essential hypertension 03/27/2019  . GERD (gastroesophageal reflux disease) 03/27/2019  . Acute respiratory failure ([place]) 03/27/2019  . Breast mass, right 06/08/2017  . Liver mass 06/08/2017  . Chronic kidney disease (CKD), stage III (moderate) 03/13/2017  . Chronic headaches 03/13/2017  . Depression 03/13/2017  . Diabetes mellitus type 2 with neurological manifestations ([place]) 03/13/2017  . History of thrombosis 03/13/2017  . Hyperlipemia, mixed 03/13/2017  . Community acquired pneumonia 03/13/2017  . COPD exacerbation ([place]) 03/13/2017  . Pneumonia 03/13/2017  . Iron deficiency anemia secondary to blood loss (chronic) 03/07/2017  . GI AVM (gastrointestinal arteriovenous vascular  malformation) 03/07/2017  . History of colonic polyps 10/05/2016    Past Surgical History:  Procedure Laterality Date  . ABDOMINAL HYSTERECTOMY    . blood clots removed from descending aorta     . CHOLECYSTECTOMY    . COLONOSCOPY    . COLONOSCOPY WITH PROPOFOL N/A 04/17/2017   Procedure: COLONOSCOPY WITH PROPOFOL;  Surgeon: Wilford Corner, MD;  Location: [facility];  Service: Endoscopy;  Laterality: N/A;  . ESOPHAGOGASTRODUODENOSCOPY (EGD) WITH PROPOFOL N/A 04/17/2017   Procedure: ESOPHAGOGASTRODUODENOSCOPY (EGD) WITH PROPOFOL;  Surgeon: Wilford Corner, MD;  Location: [facility];  Service: Endoscopy;  Laterality: N/A;  . IR IMAGING GUIDED PORT INSERTION  05/01/2019  . NEPHRECTOMY RECIPIENT       OB History   No obstetric history on file.     Family History  Problem Relation Age of Onset  . Hypertension Mother   . Diabetes Father   . Hypertension Brother   . Breast cancer Maternal Aunt        53s  . Breast cancer Maternal Aunt 75       colon cancer    Social History   Tobacco Use  . Smoking status: Current Every Day Smoker    Packs/day: 0.25    Years: 40.00    Pack years: 10.00    Types: Cigarettes  . Smokeless tobacco: Never Used  Vaping Use  . Vaping Use: Never used  Substance Use Topics  . Alcohol use: Yes  . Drug use: No    Home Medications Prior to Admission medications   Medication  Sig Start Date End Date Taking? Authorizing Provider  allopurinol (ZYLOPRIM) 300 MG tablet Take 1 tablet (300 mg total) by mouth daily. 03/31/19   Dana Allan I, MD  amLODipine (NORVASC) 5 MG tablet Take 1 tablet (5 mg total) by mouth daily. 03/18/17   Hongalgi, Lenis Dickinson, MD  atorvastatin (LIPITOR) 20 MG tablet Take 20 mg by mouth daily.    [provider]  Dulaglutide (TRULICITY) 1.5 UE/2.8MK SOPN Inject 1.5 mg into the skin once a week. Mondays    [provider]  feeding supplement, ENSURE ENLIVE, (ENSURE ENLIVE) LIQD Take 237 mLs by mouth 2  (two) times daily between meals. 03/30/19   Bonnell Public, MD  furosemide (LASIX) 40 MG tablet Take 1 tablet (40 mg total) by mouth daily as needed. 05/20/19 05/19/20  O'NealCassie Freer, MD  gabapentin (NEURONTIN) 600 MG tablet Take 600 mg by mouth 4 (four) times daily.     [provider]  Insulin Glargine (TOUJEO SOLOSTAR) 300 UNIT/ML SOPN Inject 40 Units into the skin at bedtime.     [provider]  insulin lispro (HUMALOG KWIKPEN) 100 UNIT/ML KiwkPen Inject 24 Units into the skin daily with supper.  09/19/16   [provider]  Ipratropium-Albuterol (COMBIVENT) 20-100 MCG/ACT AERS respimat Inhale 1 puff into the lungs every 6 (six) hours as needed for wheezing or shortness of breath. 03/17/17   Hongalgi, Lenis Dickinson, MD  levothyroxine (SYNTHROID) 75 MCG tablet Take 75 mcg by mouth daily. 02/28/19   [provider]  lidocaine-prilocaine (EMLA) cream Apply 1 application topically as needed. 07/03/19   Truitt Merle, MD  metoprolol (LOPRESSOR) 50 MG tablet Take 50 mg by mouth 2 (two) times daily.  03/06/14   [provider]  oxyCODONE-acetaminophen (PERCOCET) 10-325 MG tablet Take 1 tablet by mouth every 6 (six) hours as needed for pain.    [provider]  pantoprazole (PROTONIX) 40 MG tablet Take 40 mg by mouth daily.    [provider]  pregabalin (LYRICA) 25 MG capsule Take 25 mg by mouth daily. 01/24/19   [provider]  XTAMPZA ER 27 MG C12A Take 1 capsule by mouth 2 (two) times daily. 02/27/19   [provider]    Allergies    Contrast media [iodinated diagnostic agents]  Review of Systems   Review of Systems  All other systems reviewed and are negative.   Physical Exam Updated Vital Signs BP 124/62   Pulse (!) 52   Temp 98.8 F (37.1 C) (Oral)   Resp 18   SpO2 97%   Physical Exam Vitals and nursing note reviewed.  Constitutional:      General: She is not in acute distress.    Appearance: She is  well-developed. She is obese. She is not ill-appearing or diaphoretic.  HENT:     Head: Normocephalic and atraumatic.     Right Ear: External ear normal.     Left Ear: External ear normal.  Eyes:     General: No visual field deficit.    Conjunctiva/sclera: Conjunctivae normal.     Pupils: Pupils are equal, round, and reactive to light.  Neck:     Trachea: Phonation normal.  Cardiovascular:     Rate and Rhythm: Normal rate and regular rhythm.     Heart sounds: Normal heart sounds.  Pulmonary:     Effort: Pulmonary effort is normal.     Breath sounds: Normal breath sounds.  Abdominal:     Palpations: Abdomen is  soft.     Tenderness: There is no abdominal tenderness.  Musculoskeletal:        General: Normal range of motion.     Cervical back: Normal range of motion and neck supple.  Skin:    General: Skin is warm and dry.  Neurological:     Mental Status: She is alert and oriented to person, place, and time.     Cranial Nerves: Dysarthria present. No cranial nerve deficit or facial asymmetry.     Sensory: No sensory deficit.     Motor: No weakness or abnormal muscle tone.     Coordination: Coordination normal.  Psychiatric:        Mood and Affect: Mood normal.        Behavior: Behavior normal.     ED Results / Procedures / Treatments   Labs (all labs ordered are listed, but only abnormal results are displayed) Labs Reviewed  COMPREHENSIVE METABOLIC PANEL - Abnormal; Notable for the following components:      Result Value   Glucose, Bld 184 (*)    Creatinine, Ser 1.56 (*)    Alkaline Phosphatase 159 (*)    GFR calc non Af Amer 34 (*)    GFR calc Af Amer 39 (*)    All other components within normal limits  CBC WITH DIFFERENTIAL/PLATELET - Abnormal; Notable for the following components:   Hemoglobin 10.1 (*)    HCT 34.9 (*)    MCH 23.7 (*)    MCHC 28.9 (*)    RDW 17.0 (*)    All other components within normal limits  SARS CORONAVIRUS 2 BY RT PCR (HOSPITAL ORDER,  [facility])  ETHANOL  PROTIME-INR  APTT  URINALYSIS, ROUTINE W REFLEX MICROSCOPIC  RAPID URINE DRUG SCREEN, HOSP PERFORMED    EKG None  Radiology CT Head Wo Contrast  Result Date: 11/19/2019 CLINICAL DATA:  Headache, aphasia, left-sided weakness EXAM: CT HEAD WITHOUT CONTRAST TECHNIQUE: Contiguous axial images were obtained from the base of the skull through the vertex without intravenous contrast. COMPARISON:  None. FINDINGS: Brain: There is hypoattenuation in the posterior left temporal region with loss of gray-white differentiation. There is mild regional mass effect. No acute intracranial hemorrhage. Ventricles and sulci are normal in size and configuration. There is no extra-axial fluid collection. Vascular: No hyperdense vessel. There is mild intracranial atherosclerotic calcification at the skull base. Skull: Unremarkable. Sinuses/Orbits: Aerated.  No significant orbital abnormality. Other: Inferior right mastoid opacification. IMPRESSION: Edema centered within the posterior left temporal region likely reflecting acute to subacute infarction. MRI is recommended for further evaluation. No acute intracranial hemorrhage or significant mass effect. Electronically Signed   By: Macy Mis M.D.   On: 11/19/2019 13:29    Procedures .Critical Care Performed by: Daleen Bo, MD Authorized by: Daleen Bo, MD   Critical care provider statement:    Critical care time (minutes):  35   Critical care start time:  11/19/2019 12:45 PM   Critical care end time:  11/19/2019 3:02 PM   Critical care time was exclusive of:  Separately billable procedures and treating other patients   Critical care was necessary to treat or prevent imminent or life-threatening deterioration of the following conditions:  CNS failure or compromise   Critical care was time spent personally by me on the following activities:  Blood draw for specimens, development of treatment plan with patient  or surrogate, discussions with consultants, evaluation of patient's response to treatment, examination of patient, obtaining history  from patient or surrogate, ordering and performing treatments and interventions, ordering and review of laboratory studies, pulse oximetry, re-evaluation of patient's condition, review of old charts and ordering and review of radiographic studies   (including critical care time)  Medications Ordered in ED Medications  0.9 %  sodium chloride infusion ( Intravenous New Bag/Given 11/19/19 1329)  sodium chloride 0.9 % bolus 500 mL (500 mLs Intravenous New Bag/Given 11/19/19 1329)    ED Course  I have reviewed the triage vital signs and the nursing notes.  Pertinent labs & imaging results that were available during my care of the patient were reviewed by me and considered in my medical decision making (see chart for details).  Clinical Course as of Nov 18 1457  Tue Nov 19, 2019  1425 Normal except hemoglobin low, RBC indices low with increased RDW  CBC with Differential(!) [EW]  1425 Normal except glucose high, creatinine high, alk phos tests high, GFR low  Comprehensive metabolic panel(!) [EW]  1438 CT consistent with subacute stroke, MRI ordered for definitive diagnosis.  CT Head Wo Contrast [EW]  1455 Case discussed with neuro hospitalist, who accepts patient as consult and will see her when she gets to [facility]   [EW]  1458 RDW(!): 17.0 [EW]    Clinical Course User Index [EW] Daleen Bo, MD   MDM Rules/Calculators/A&P                           Patient Vitals for the past 24 hrs:  BP Temp Temp src Pulse Resp SpO2  11/19/19 1331 124/62 - - (!) 52 18 97 %  11/19/19 1229 (!) 159/65 98.8 F (37.1 C) Oral 61 18 100 %    2:57 PM Reevaluation with update and discussion. After initial assessment and treatment, an updated evaluation reveals no change in clinical status, she is agreeable to hospitalization for further evaluation treatment.  Findings  discussed and questions answered. Daleen Bo   Medical Decision Making:  This patient is presenting for evaluation of confusion and dysarthria, which does require a range of treatment options, and is a complaint that involves a high risk of morbidity and mortality. The differential diagnoses include stroke, metabolic induced encephalopathy, serious bacterial infection. I decided to review old records, and in summary elderly female, with ongoing altered mental status and improving confusion associated with altered speech..  I obtain additional historical information from sister at bedside.  Clinical Laboratory Tests Ordered, included CBC, Metabolic panel, Urinalysis and Urine drug screen, alcohol level, INR. Review indicates initial findings normal except presence of anemia. Radiologic Tests Ordered, included head CT.  I independently Visualized: Radiographic images, which show hypoattenuation, left posterior temporal region.  Cardiac Monitor Tracing which shows normal sinus rhythm    Critical Interventions-clinical evaluation, laboratory testing, CT imaging, observation and reevaluation.  Initial findings consistent with CVA, left brain.  Advanced imaging ordered, consultation with neurology consult for recommendations.  Plans made for admission to [facility].  After These Interventions, the Patient was reevaluated and was found to require admission for evaluation of a subacute stroke, occurring 4 days ago.  CRITICAL CARE-yes Performed by: Daleen Bo  Nursing Notes Reviewed/ Care Coordinated Applicable Imaging Reviewed Interpretation of Laboratory Data incorporated into ED treatment  3:03 PM-Consult complete with hospitalist. Patient case explained and discussed. Hospitalist agrees to admit patient for further evaluation and treatment. Call ended at 4:45 PM     Final Clinical Impression(s) / ED Diagnoses Final diagnoses:  Cerebrovascular accident (CVA), unspecified  mechanism ([place])    Rx / DC Orders ED Discharge Orders    None       Daleen Bo, MD 11/19/19 1655

## 2019-11-19 NOTE — H&P (Signed)
History and Physical    Bethany [name]  YJE:[id number]  DOB: 1950/07/19  DOA: 11/19/2019  PCP: Wenda Low, MD Patient coming from: home  Chief Complaint: difficulty with speech  HPI:  Bethany [name] is a 69 yo AA female with PMH DMII with neuropathy, hypertension, hyperlipidemia who presented to the ER with difficulty with speech.  Information is provided by her sister, Bethany [name] via the phone as the patient has difficulty getting her words out during interview.  The patient started having trouble finding her words on Saturday, but other family reported noticing her symptoms even a couple days prior.  Beginning on Saturday, her symptoms started to progressively worsen and family convinced her to come to the ER for further evaluation. The patient also endorses changes in sensation on the right side of her body.  She does have noticeable expressive aphasia on interview.  CT head obtained in the ER revealed findings in the left temporal region suggestive of ischemic stroke.  Neurology recommended transfer to [facility] for further evaluation.  I have personally briefly reviewed patient's old medical records in [place]  Assessment/Plan: CVA (cerebral vascular accident) ([place]) - per CT head; "Edema centered within the posterior left temporal region likely reflecting acute to subacute infarction".  Deficit appears to be expressive aphasia as well as right-sided paresthesias -Transfer [facility] placed -will need neuro consult upon arrival -follow up A1c, TSH, lipid -Symptoms started over 24 hours ago, no permissive hypertension needed further -Follow-up MRI brain, echo, carotid ultrasound - further decision regarding DAPT deferred to neuro  Diabetes mellitus type 2 with neurological manifestations ([place]) - okay to resume gabapentin - follow up A1c - SSI for now; resume basal insulin if indicated  Essential hypertension - labetalol or hydralazine PRN   Code Status: Full DVT  Prophylaxis:enoxaparin (Lovenox) 40mg  SQ 2 hours prior to surgery then every day Anticipated disposition is to home  History: Past Medical History:  Diagnosis Date  . Acute renal failure ([place])   . Arthritis   . Diabetes mellitus without complication ([place])    type II   . Hypercalcemia   . Hyperlipidemia   . Hypertension   . Renal disorder   . Single kidney   . Thrombosis     Past Surgical History:  Procedure Laterality Date  . ABDOMINAL HYSTERECTOMY    . blood clots removed from descending aorta     . CHOLECYSTECTOMY    . COLONOSCOPY    . COLONOSCOPY WITH PROPOFOL N/A 04/17/2017   Procedure: COLONOSCOPY WITH PROPOFOL;  Surgeon: Wilford Corner, MD;  [facility]: [facility];  Service: Endoscopy;  Laterality: N/A;  . ESOPHAGOGASTRODUODENOSCOPY (EGD) WITH PROPOFOL N/A 04/17/2017   Procedure: ESOPHAGOGASTRODUODENOSCOPY (EGD) WITH PROPOFOL;  Surgeon: Wilford Corner, MD;  [facility];  Service: Endoscopy;  Laterality: N/A;  . IR IMAGING GUIDED PORT INSERTION  05/01/2019  . NEPHRECTOMY RECIPIENT       reports that she has been smoking cigarettes. She has a 10.00 pack-year smoking history. She has never used smokeless tobacco. She reports current alcohol use. She reports that she does not use drugs.  Allergies  Allergen Reactions  . Contrast Media [Iodinated Diagnostic Agents]     Renal hypertension per physician    Family History  Problem Relation Age of Onset  . Hypertension Mother   . Diabetes Father   . Hypertension Brother   . Breast cancer Maternal Aunt        69s  . Breast cancer Maternal Aunt 43  colon cancer    Home Medications: Prior to Admission medications   Medication Sig Start Date End Date Taking? Authorizing Provider  allopurinol (ZYLOPRIM) 300 MG tablet Take 1 tablet (300 mg total) by mouth daily. 03/31/19   Dana Allan I, MD  amLODipine (NORVASC) 5 MG tablet Take 1 tablet (5 mg total) by mouth daily. 03/18/17   Hongalgi, Lenis Dickinson,  MD  atorvastatin (LIPITOR) 20 MG tablet Take 20 mg by mouth daily.    [provider]  Dulaglutide (TRULICITY) 1.5 AC/1.6SA SOPN Inject 1.5 mg into the skin once a week. Mondays    [provider]  feeding supplement, ENSURE ENLIVE, (ENSURE ENLIVE) LIQD Take 237 mLs by mouth 2 (two) times daily between meals. 03/30/19   Bonnell Public, MD  furosemide (LASIX) 40 MG tablet Take 1 tablet (40 mg total) by mouth daily as needed. 05/20/19 05/19/20  O'NealCassie Freer, MD  gabapentin (NEURONTIN) 600 MG tablet Take 600 mg by mouth 4 (four) times daily.     [provider]  Insulin Glargine (TOUJEO SOLOSTAR) 300 UNIT/ML SOPN Inject 40 Units into the skin at bedtime.     [provider]  insulin lispro (HUMALOG KWIKPEN) 100 UNIT/ML KiwkPen Inject 24 Units into the skin daily with supper.  09/19/16   [provider]  Ipratropium-Albuterol (COMBIVENT) 20-100 MCG/ACT AERS respimat Inhale 1 puff into the lungs every 6 (six) hours as needed for wheezing or shortness of breath. 03/17/17   Hongalgi, Lenis Dickinson, MD  levothyroxine (SYNTHROID) 75 MCG tablet Take 75 mcg by mouth daily. 02/28/19   [provider]  lidocaine-prilocaine (EMLA) cream Apply 1 application topically as needed. 07/03/19   Truitt Merle, MD  metoprolol (LOPRESSOR) 50 MG tablet Take 50 mg by mouth 2 (two) times daily.  03/06/14   [provider]  oxyCODONE-acetaminophen (PERCOCET) 10-325 MG tablet Take 1 tablet by mouth every 6 (six) hours as needed for pain.    [provider]  pantoprazole (PROTONIX) 40 MG tablet Take 40 mg by mouth daily.    [provider]  pregabalin (LYRICA) 25 MG capsule Take 25 mg by mouth daily. 01/24/19   [provider]  XTAMPZA ER 27 MG C12A Take 1 capsule by mouth 2 (two) times daily. 02/27/19   [provider]    Review of Systems:  Pertinent items are noted in HPI.  Physical Exam: Vitals:   11/19/19 1331 11/19/19 1500  11/19/19 1600 11/19/19 1700  BP: 124/62 (!) 153/63 (!) 130/97 (!) 160/68  Pulse: (!) 52 (!) 52 (!) 59 70  Resp: 18 15 18 17   Temp:      TempSrc:      SpO2: 97% 97% 93% 94%   General appearance: Pleasant appearing adult woman resting in bed in no distress Head: Normocephalic, without obvious abnormality Eyes: PERRL, EOMI Lungs: clear to auscultation bilaterally Heart: regular rate and rhythm and S1, S2 normal Abdomen: normal findings: bowel sounds normal and soft, non-tender Extremities: No edema Skin: mobility and turgor normal Neurologic: Paresthesia appreciated in right upper and lower extremity.  Paresthesia also appreciated in V1 distribution.  No obvious dysmetria.  Expressive aphasia appreciated.  Strength is 5/5 throughout  Labs on Admission:  I have personally reviewed following labs and imaging studies Results for orders placed or performed during the hospital encounter of 11/19/19 (from the past 24 hour(s))  Comprehensive metabolic panel     Status: Abnormal   Collection Time: 11/19/19  1:17 PM  Result Value  Ref Range   Sodium 137 135 - 145 mmol/L   Potassium 4.2 3.5 - 5.1 mmol/L   Chloride 100 98 - 111 mmol/L   CO2 26 22 - 32 mmol/L   Glucose, Bld 184 (H) 70 - 99 mg/dL   BUN 20 8 - 23 mg/dL   Creatinine, Ser 1.56 (H) 0.44 - 1.00 mg/dL   Calcium 9.9 8.9 - 10.3 mg/dL   Total Protein 6.9 6.5 - 8.1 g/dL   Albumin 3.9 3.5 - 5.0 g/dL   AST 20 15 - 41 U/L   ALT 23 0 - 44 U/L   Alkaline Phosphatase 159 (H) 38 - 126 U/L   Total Bilirubin 0.7 0.3 - 1.2 mg/dL   GFR calc non Af Amer 34 (L) >60 mL/min   GFR calc Af Amer 39 (L) >60 mL/min   Anion gap 11 5 - 15  CBC with Differential     Status: Abnormal   Collection Time: 11/19/19  1:17 PM  Result Value Ref Range   WBC 8.8 4.0 - 10.5 K/uL   RBC 4.27 3.87 - 5.11 MIL/uL   Hemoglobin 10.1 (L) 12.0 - 15.0 g/dL   HCT 34.9 (L) 36 - 46 %   MCV 81.7 80.0 - 100.0 fL   MCH 23.7 (L) 26.0 - 34.0 pg   MCHC 28.9 (L) 30.0 - 36.0 g/dL     RDW 17.0 (H) 11.5 - 15.5 %   Platelets 238 150 - 400 K/uL   nRBC 0.0 0.0 - 0.2 %   Neutrophils Relative % 69 %   Neutro Abs 6.0 1.7 - 7.7 K/uL   Lymphocytes Relative 20 %   Lymphs Abs 1.7 0.7 - 4.0 K/uL   Monocytes Relative 9 %   Monocytes Absolute 0.8 0 - 1 K/uL   Eosinophils Relative 2 %   Eosinophils Absolute 0.2 0 - 0 K/uL   Basophils Relative 0 %   Basophils Absolute 0.0 0 - 0 K/uL   Immature Granulocytes 0 %   Abs Immature Granulocytes 0.03 0.00 - 0.07 K/uL  Ethanol     Status: None   Collection Time: 11/19/19  1:17 PM  Result Value Ref Range   Alcohol, Ethyl (B) <10 <10 mg/dL  Protime-INR     Status: None   Collection Time: 11/19/19  1:17 PM  Result Value Ref Range   Prothrombin Time 12.6 11.4 - 15.2 seconds   INR 1.0 0.8 - 1.2  APTT     Status: None   Collection Time: 11/19/19  1:17 PM  Result Value Ref Range   aPTT 26 24 - 36 seconds  SARS Coronavirus 2 by RT PCR (hospital order, performed in [place] hospital lab) Nasopharyngeal Nasopharyngeal Swab     Status: None   Collection Time: 11/19/19  3:06 PM   Specimen: Nasopharyngeal Swab  Result Value Ref Range   SARS Coronavirus 2 NEGATIVE NEGATIVE     Radiological Exams on Admission: CT Head Wo Contrast  Result Date: 11/19/2019 CLINICAL DATA:  Headache, aphasia, left-sided weakness EXAM: CT HEAD WITHOUT CONTRAST TECHNIQUE: Contiguous axial images were obtained from the base of the skull through the vertex without intravenous contrast. COMPARISON:  None. FINDINGS: Brain: There is hypoattenuation in the posterior left temporal region with loss of gray-white differentiation. There is mild regional mass effect. No acute intracranial hemorrhage. Ventricles and sulci are normal in size and configuration. There is no extra-axial fluid collection. Vascular: No hyperdense vessel. There is mild intracranial atherosclerotic calcification at the skull  base. Skull: Unremarkable. Sinuses/Orbits: Aerated.  No significant orbital  abnormality. Other: Inferior right mastoid opacification. IMPRESSION: Edema centered within the posterior left temporal region likely reflecting acute to subacute infarction. MRI is recommended for further evaluation. No acute intracranial hemorrhage or significant mass effect. Electronically Signed   By: Macy Mis M.D.   On: 11/19/2019 13:29   CT Head Wo Contrast  Final Result    MR BRAIN WO CONTRAST    (Results Pending)    Consults called:  Neurology to be consulted on arrival to [facility]  EKG: Independently reviewed. Normal sinus rhythm   Dwyane Dee, MD [facility] Pager: Secure chat  If 7PM-7AM, please contact night-coverage [email or web address] Use universal [place] password for that web site. If you do not have the password, please call the hospital operator.  11/19/2019, 5:41 PM

## 2019-11-20 ENCOUNTER — Encounter (HOSPITAL_COMMUNITY): Payer: Medicare Other

## 2019-11-20 ENCOUNTER — Other Ambulatory Visit: Payer: Self-pay

## 2019-11-20 ENCOUNTER — Inpatient Hospital Stay (HOSPITAL_COMMUNITY): Payer: Medicare Other

## 2019-11-20 ENCOUNTER — Encounter (HOSPITAL_COMMUNITY): Payer: Self-pay | Admitting: Internal Medicine

## 2019-11-20 DIAGNOSIS — I10 Essential (primary) hypertension: Secondary | ICD-10-CM

## 2019-11-20 DIAGNOSIS — I6389 Other cerebral infarction: Secondary | ICD-10-CM

## 2019-11-20 DIAGNOSIS — I639 Cerebral infarction, unspecified: Secondary | ICD-10-CM

## 2019-11-20 LAB — CREATININE, SERUM
Creatinine, Ser: 1.04 mg/dL — ABNORMAL HIGH (ref 0.44–1.00)
GFR calc Af Amer: >60 mL/min (ref >60)
GFR calc non Af Amer: 55 mL/min — ABNORMAL LOW (ref >60)

## 2019-11-20 LAB — CBG MONITORING, ED
Glucose-Capillary: 114 mg/dL — ABNORMAL HIGH (ref 70–99)
Glucose-Capillary: 155 mg/dL — ABNORMAL HIGH (ref 70–99)
Glucose-Capillary: 130 mg/dL — ABNORMAL HIGH (ref 70–99)

## 2019-11-20 LAB — GLUCOSE, CAPILLARY: Glucose-Capillary: 171 mg/dL — ABNORMAL HIGH (ref 70–99)

## 2019-11-20 LAB — HEMOGLOBIN A1C
Hgb A1c MFr Bld: 5.9 % — ABNORMAL HIGH (ref 4.8–5.6)
Hgb A1c MFr Bld: 5.7 % — ABNORMAL HIGH (ref 4.8–5.6)
Mean Plasma Glucose: 122.63 mg/dL
Mean Plasma Glucose: 116.89 mg/dL

## 2019-11-20 LAB — BASIC METABOLIC PANEL
Anion gap: 10 (ref 5–15)
BUN: 16 mg/dL (ref 8–23)
CO2: 26 mmol/L (ref 22–32)
Calcium: 9.9 mg/dL (ref 8.9–10.3)
Chloride: 103 mmol/L (ref 98–111)
Creatinine, Ser: 1.01 mg/dL — ABNORMAL HIGH (ref 0.44–1.00)
GFR calc Af Amer: >60 mL/min (ref >60)
GFR calc non Af Amer: 57 mL/min — ABNORMAL LOW (ref >60)
Glucose, Bld: 131 mg/dL — ABNORMAL HIGH (ref 70–99)
Potassium: 4.2 mmol/L (ref 3.5–5.1)
Sodium: 139 mmol/L (ref 135–145)

## 2019-11-20 LAB — CBC WITH DIFFERENTIAL/PLATELET
Abs Immature Granulocytes: 0.01 10*3/uL (ref 0.00–0.07)
Basophils Absolute: 0 10*3/uL (ref 0.0–0.1)
Basophils Relative: 0 %
Eosinophils Absolute: 0.2 10*3/uL (ref 0.0–0.5)
Eosinophils Relative: 2 %
HCT: 33.4 % — ABNORMAL LOW (ref 36.0–46.0)
Hemoglobin: 9.9 g/dL — ABNORMAL LOW (ref 12.0–15.0)
Immature Granulocytes: 0 %
Lymphocytes Relative: 21 %
Lymphs Abs: 1.5 10*3/uL (ref 0.7–4.0)
MCH: 24.4 pg — ABNORMAL LOW (ref 26.0–34.0)
MCHC: 29.6 g/dL — ABNORMAL LOW (ref 30.0–36.0)
MCV: 82.5 fL (ref 80.0–100.0)
Monocytes Absolute: 0.5 10*3/uL (ref 0.1–1.0)
Monocytes Relative: 7 %
Neutro Abs: 4.9 10*3/uL (ref 1.7–7.7)
Neutrophils Relative %: 70 %
Platelets: 208 10*3/uL (ref 150–400)
RBC: 4.05 MIL/uL (ref 3.87–5.11)
RDW: 16.9 % — ABNORMAL HIGH (ref 11.5–15.5)
WBC: 7 10*3/uL (ref 4.0–10.5)
nRBC: 0 % (ref 0.0–0.2)

## 2019-11-20 LAB — LIPID PANEL
Cholesterol: 179 mg/dL (ref 0–200)
HDL: 35 mg/dL — ABNORMAL LOW (ref >40)
LDL Cholesterol: 91 mg/dL (ref 0–99)
Total CHOL/HDL Ratio: 5.1 RATIO
Triglycerides: 267 mg/dL — ABNORMAL HIGH (ref <150)
VLDL: 53 mg/dL — ABNORMAL HIGH (ref 0–40)

## 2019-11-20 LAB — MAGNESIUM: Magnesium: 1.8 mg/dL (ref 1.7–2.4)

## 2019-11-20 LAB — TSH: TSH: 3.079 u[IU]/mL (ref 0.350–4.500)

## 2019-11-20 LAB — ECHOCARDIOGRAM COMPLETE

## 2019-11-20 MED ORDER — OXYCODONE HCL 5 MG PO TABS
5.0000 mg | ORAL_TABLET | Freq: Four times a day (QID) | ORAL | Status: DC | PRN
  Administered 2019-11-20 – 2019-11-22 (×2): 5 mg via ORAL
  Filled 2019-11-20 (×2): qty 1

## 2019-11-20 MED ORDER — OXYCODONE ER 27 MG PO C12A: 1.0000 | EXTENDED_RELEASE_CAPSULE | Freq: Two times a day (BID) | ORAL | Status: DC

## 2019-11-20 MED ORDER — GABAPENTIN 600 MG PO TABS: 600.0000 mg | ORAL_TABLET | Freq: Four times a day (QID) | ORAL | Status: DC

## 2019-11-20 MED ORDER — PANTOPRAZOLE SODIUM 40 MG PO TBEC
40.0000 mg | DELAYED_RELEASE_TABLET | Freq: Every day | ORAL | Status: DC
  Administered 2019-11-20 – 2019-11-22 (×3): 40 mg via ORAL
  Filled 2019-11-20 (×3): qty 1

## 2019-11-20 MED ORDER — OXYCODONE HCL ER 10 MG PO T12A
20.0000 mg | EXTENDED_RELEASE_TABLET | Freq: Two times a day (BID) | ORAL | Status: DC
  Administered 2019-11-20 – 2019-11-22 (×5): 20 mg via ORAL
  Filled 2019-11-20 (×5): qty 2

## 2019-11-20 MED ORDER — ATORVASTATIN CALCIUM 80 MG PO TABS
80.0000 mg | ORAL_TABLET | Freq: Every day | ORAL | Status: DC
  Administered 2019-11-21 – 2019-11-22 (×2): 80 mg via ORAL
  Filled 2019-11-20 (×2): qty 1

## 2019-11-20 MED ORDER — PREGABALIN 25 MG PO CAPS
25.0000 mg | ORAL_CAPSULE | Freq: Two times a day (BID) | ORAL | Status: DC
  Administered 2019-11-20 – 2019-11-22 (×4): 25 mg via ORAL
  Filled 2019-11-20 (×4): qty 1

## 2019-11-20 MED ORDER — ALLOPURINOL 100 MG PO TABS
300.0000 mg | ORAL_TABLET | Freq: Every day | ORAL | Status: DC
  Administered 2019-11-20 – 2019-11-21 (×2): 300 mg via ORAL
  Filled 2019-11-20: qty 1
  Filled 2019-11-20: qty 3

## 2019-11-20 MED ORDER — BUPRENORPHINE 20 MCG/HR TD PTWK: 1.0000 | MEDICATED_PATCH | TRANSDERMAL | Status: DC

## 2019-11-20 MED ORDER — CHLORHEXIDINE GLUCONATE CLOTH 2 % EX PADS
6.0000 | MEDICATED_PAD | Freq: Every day | CUTANEOUS | Status: DC
  Administered 2019-11-20 – 2019-11-21 (×2): 6 via TOPICAL

## 2019-11-20 MED ORDER — ATORVASTATIN CALCIUM 40 MG PO TABS
40.0000 mg | ORAL_TABLET | Freq: Every day | ORAL | Status: DC
  Administered 2019-11-20: 40 mg via ORAL

## 2019-11-20 MED ORDER — OXYCODONE-ACETAMINOPHEN 5-325 MG PO TABS
1.0000 | ORAL_TABLET | Freq: Four times a day (QID) | ORAL | Status: DC | PRN
  Administered 2019-11-20: 1 via ORAL
  Filled 2019-11-20: qty 1

## 2019-11-20 MED ORDER — LEVOTHYROXINE SODIUM 75 MCG PO TABS
75.0000 ug | ORAL_TABLET | Freq: Every day | ORAL | Status: DC
  Administered 2019-11-21 – 2019-11-22 (×2): 75 ug via ORAL
  Filled 2019-11-20 (×2): qty 1

## 2019-11-20 MED ORDER — BACLOFEN 10 MG PO TABS: 10.0000 mg | ORAL_TABLET | Freq: Three times a day (TID) | ORAL | Status: DC | PRN

## 2019-11-20 MED ORDER — ASPIRIN EC 325 MG PO TBEC
325.0000 mg | DELAYED_RELEASE_TABLET | Freq: Every day | ORAL | Status: DC
  Administered 2019-11-21: 325 mg via ORAL
  Filled 2019-11-20 (×2): qty 1

## 2019-11-20 MED ORDER — OXYCODONE-ACETAMINOPHEN 10-325 MG PO TABS: 1.0000 | ORAL_TABLET | Freq: Four times a day (QID) | ORAL | Status: DC | PRN

## 2019-11-20 NOTE — Progress Notes (Signed)
OT Cancellation Note  Patient Details Name: Bethany [name] MRN: [id number] DOB: 25-Jan-1951   Cancelled Treatment:    Reason Eval/Treat Not Completed: Other (comment) Per RN, patient to transfer to [facility]. Will f/u as able if patient not transferred.  Brock Mokry L Bentli Llorente 11/20/2019, 3:23 PM

## 2019-11-20 NOTE — ED Notes (Signed)
[facility] called for transport to [id number]

## 2019-11-20 NOTE — ED Notes (Signed)
Report given to [facility]. States they will arrive in 5 - 10 minutes to transport the pt.

## 2019-11-20 NOTE — ED Notes (Signed)
Pt transported to MRI 

## 2019-11-20 NOTE — Progress Notes (Signed)
  Echocardiogram 2D Echocardiogram has been performed.  Bethany [name] 11/20/2019, 1:37 PM

## 2019-11-20 NOTE — Consult Note (Addendum)
Neurology Consultation  Reason for Consult: Dr. Loleta Books Referring Physician: Stroke  CC: Expressive aphasia  History is obtained from: Patient  HPI: Bethany [name] is a 69 y.o. female with history of thrombosis, hypertension, hyperlipidemia, hypercalcemia, diabetes.  Patient states that on Saturday at approximately 6 PM she was sitting with her sister and talking on the phone with her daughter.  At that point she felt that she had a sudden onset of a headache which was followed by significant difficulty getting her words out the way she desired to.  Due to symptoms not improving patient was brought to the emergency department.  At that point time MRI confirmed that she did suffer from a stroke.  Currently patient continues to have headache and difficulty with expressing herself.  She is talking with broken speech and at times cannot find the words she needs to articulate correctly.  She admits to not taking aspirin daily and does smoke approximately half a pack a day.  While in the hospital she also reports that at 1 point in time while going to the bathroom, her legs buckled on her which is never occurred before.  In addition she will become very diaphoretic at times and also appears to be uncomfortable.  ED course  Relevant labs include -hemoglobin A1c 5.7, TSH 3.079, LDL 91, urinalysis positive for nitrite-leukocytes-many bacteria, creatinine 1.56.  CT head shows-edema centered within the posterior left temporal region likely reflecting acute to subacute infarction.  MRI brain-acute posterior left MCA territory infarct without mass-effect.  Small focus of petechial hemorrhage with mild cytotoxic edema.  Chronic ischemic microangiopathy.  LKW: 11/16/2019 at 6 PM tpa given?: no, out of window Premorbid modified Rankin scale (mRS): 0 NIH stroke scale of 2  Past Medical History:  Diagnosis Date  . Acute renal failure ([place])   . Arthritis   . Diabetes mellitus without complication ([place])     type II   . Hypercalcemia   . Hyperlipidemia   . Hypertension   . Renal disorder   . Single kidney   . Thrombosis   . Tobacco abuse     Family History  Problem Relation Age of Onset  . Hypertension Mother   . Diabetes Father   . Hypertension Brother   . Breast cancer Maternal Aunt        77s  . Breast cancer Maternal Aunt 75       colon cancer   Social History:   reports that she has been smoking cigarettes. She has a 10.00 pack-year smoking history. She has never used smokeless tobacco. She reports current alcohol use. She reports that she does not use drugs.  Medications  Current Facility-Administered Medications:  .  0.9 %  sodium chloride infusion, , Intravenous, Continuous, Dwyane Dee, MD, Stopped at 11/20/19 0018 .  acetaminophen (TYLENOL) tablet 650 mg, 650 mg, Oral, Q4H PRN **OR** acetaminophen (TYLENOL) 160 MG/5ML solution 650 mg, 650 mg, Per Tube, Q4H PRN **OR** acetaminophen (TYLENOL) suppository 650 mg, 650 mg, Rectal, Q4H PRN, Dwyane Dee, MD .  allopurinol (ZYLOPRIM) tablet 300 mg, 300 mg, Oral, Daily, Girguis, David, MD .  atorvastatin (LIPITOR) tablet 40 mg, 40 mg, Oral, Daily, Girguis, David, MD .  baclofen (LIORESAL) tablet 10 mg, 10 mg, Oral, TID PRN, Dwyane Dee, MD .  gabapentin (NEURONTIN) capsule 600 mg, 600 mg, Oral, QID, Dwyane Dee, MD, 600 mg at 11/19/19 2300 .  hydrALAZINE (APRESOLINE) tablet 25 mg, 25 mg, Oral, Q4H PRN, Dwyane Dee, MD .  insulin  aspart (novoLOG) injection 0-15 Units, 0-15 Units, Subcutaneous, TID AC & HS, Dwyane Dee, MD, 3 Units at 11/19/19 2300 .  labetalol (NORMODYNE) injection 10 mg, 10 mg, Intravenous, Q4H PRN, Dwyane Dee, MD .  levothyroxine (SYNTHROID) tablet 75 mcg, 75 mcg, Oral, Daily, Girguis, David, MD .  oxyCODONE ER C12A 1 capsule, 1 capsule, Oral, BID, Girguis, David, MD .  pantoprazole (PROTONIX) EC tablet 40 mg, 40 mg, Oral, Daily, Girguis, David, MD .  senna-docusate (Senokot-S) tablet 1  tablet, 1 tablet, Oral, QHS PRN, Dwyane Dee, MD  Current Outpatient Medications:  .  allopurinol (ZYLOPRIM) 300 MG tablet, Take 1 tablet (300 mg total) by mouth daily., Disp: 30 tablet, Rfl: 0 .  amLODipine (NORVASC) 10 MG tablet, Take 10 mg by mouth daily., Disp: , Rfl:  .  APPLE CIDER VINEGAR PO, Take 1 tablet by mouth daily., Disp: , Rfl:  .  atorvastatin (LIPITOR) 40 MG tablet, Take 40 mg by mouth daily., Disp: , Rfl:  .  baclofen (LIORESAL) 10 MG tablet, Take 10 mg by mouth 3 (three) times daily as needed for muscle spasms., Disp: , Rfl:  .  buprenorphine (BUTRANS) 20 MCG/HR PTWK, Place 1 patch onto the skin once a week. , Disp: , Rfl:  .  Dulaglutide (TRULICITY) 1.5 FA/2.1HY SOPN, Inject 1.5 mg into the skin once a week. Mondays, Disp: , Rfl:  .  ezetimibe (ZETIA) 10 MG tablet, Take 10 mg by mouth daily., Disp: , Rfl:  .  feeding supplement, ENSURE ENLIVE, (ENSURE ENLIVE) LIQD, Take 237 mLs by mouth 2 (two) times daily between meals., Disp: 237 mL, Rfl: 12 .  gabapentin (NEURONTIN) 600 MG tablet, Take 600 mg by mouth 4 (four) times daily. , Disp: , Rfl:  .  Insulin Glargine (TOUJEO SOLOSTAR) 300 UNIT/ML SOPN, Inject 34 Units into the skin at bedtime. , Disp: , Rfl:  .  insulin lispro (HUMALOG KWIKPEN) 100 UNIT/ML KiwkPen, Inject 24 Units into the skin daily with supper. , Disp: , Rfl:  .  Ipratropium-Albuterol (COMBIVENT) 20-100 MCG/ACT AERS respimat, Inhale 1 puff into the lungs every 6 (six) hours as needed for wheezing or shortness of breath., Disp: 1 Inhaler, Rfl: 0 .  levothyroxine (SYNTHROID) 75 MCG tablet, Take 75 mcg by mouth daily., Disp: , Rfl:  .  lidocaine-prilocaine (EMLA) cream, Apply 1 application topically as needed., Disp: 30 g, Rfl: 1 .  metoprolol (LOPRESSOR) 50 MG tablet, Take 50 mg by mouth 2 (two) times daily. , Disp: , Rfl:  .  oxyCODONE-acetaminophen (PERCOCET) 10-325 MG tablet, Take 1 tablet by mouth every 6 (six) hours as needed for pain., Disp: , Rfl:  .   pantoprazole (PROTONIX) 40 MG tablet, Take 40 mg by mouth daily., Disp: , Rfl:  .  pregabalin (LYRICA) 25 MG capsule, Take 25 mg by mouth 2 (two) times daily. , Disp: , Rfl:  .  senna (SENOKOT) 8.6 MG TABS tablet, Take 1 tablet by mouth., Disp: , Rfl:  .  Vitamin D, Ergocalciferol, (DRISDOL) 1.25 MG (50000 UNIT) CAPS capsule, Take 50,000 Units by mouth every 7 (seven) days., Disp: , Rfl:  .  XTAMPZA ER 27 MG C12A, Take 1 capsule by mouth 2 (two) times daily., Disp: , Rfl:  .  furosemide (LASIX) 40 MG tablet, Take 1 tablet (40 mg total) by mouth daily as needed., Disp: 45 tablet, Rfl: 3  ROS:     General ROS: negative for - chills, fatigue, fever, night sweats, weight gain or weight loss Psychological  ROS: negative for - behavioral disorder, hallucinations, memory difficulties, mood swings or suicidal ideation Ophthalmic ROS: negative for - blurry vision, double vision, eye pain or loss of vision ENT ROS: negative for - epistaxis, nasal discharge, oral lesions, sore throat, tinnitus or vertigo Endocrine ROS: negative for - galactorrhea, hair pattern changes, polydipsia/polyuria or temperature intolerance Respiratory ROS: negative for - cough, hemoptysis, shortness of breath or wheezing Cardiovascular ROS: negative for - chest pain, dyspnea on exertion, edema or irregular heartbeat Gastrointestinal ROS: negative for - abdominal pain, diarrhea, hematemesis, nausea/vomiting or stool incontinence Genito-Urinary ROS: negative for - dysuria, hematuria, incontinence or urinary frequency/urgency Musculoskeletal ROS: Positive for -  muscular weakness Neurological ROS: as noted in HPI Dermatological ROS: negative for rash and skin lesion changes  Exam: Current vital signs: BP (!) 159/61   Pulse (!) 58   Temp 97.9 F (36.6 C) (Oral)   Resp 17   SpO2 95%  Vital signs in last 24 hours: Temp:  [97.9 F (36.6 C)-98.8 F (37.1 C)] 97.9 F (36.6 C) (07/07 0803) Pulse Rate:  [52-76] 58 (07/07  0830) Resp:  [14-20] 17 (07/07 0830) BP: (121-179)/(48-97) 159/61 (07/07 0830) SpO2:  [90 %-100 %] 95 % (07/07 0830)   Constitutional: Appears well-developed and well-nourished.  Eyes: No scleral injection HENT: No OP obstrucion Head: Normocephalic.  Cardiovascular: Normal rate and regular rhythm.  Respiratory: Effort normal, non-labored breathing GI: Soft.  No distension. There is no tenderness.  Skin: WDI  Neuro: Mental Status: Patient is awake, alert, oriented to person, place, month, year, and situation. Speech-is nonfluent, has difficulty expressing herself, naming is intact repeating is intact.  Comprehension is intact and able to follow commands  Cranial Nerves: II: Visual Fields are full.  III,IV, VI: EOMI without ptosis or diploplia. Pupils equal, round and reactive to light V: Facial sensation is symmetric to temperature VII: Facial movement is symmetric.  VIII: hearing is intact to voice X: Palat elevates symmetrically XI: Shoulder shrug is symmetric. XII: tongue is midline without atrophy or fasciculations.  Motor: Tone is normal. Bulk is normal. 5/5 strength was present in bilateral upper extremities however it is noted that the right tricep extension is a 4/5.  Straight leg rise is able to lift off the bed but not significantly high.  With hip flexion she does have 5/5 strength. [place] Sensory: Sensation is symmetric to light touch and temperature in the arms and legs. DSS intact Deep Tendon Reflexes: 2+ and symmetric in the biceps and patellae.  Plantars: Toes are downgoing bilaterally.  Cerebellar: FNF intact bilaterally and HKS are intact bilaterally however limited and range of motion  Labs I have reviewed labs in epic and the results pertinent to this consultation are:   CBC    Component Value Date/Time   WBC 7.0 11/20/2019 0433   RBC 4.05 11/20/2019 0433   HGB 9.9 (L) 11/20/2019 0433   HGB 10.4 (L) 11/08/2019 1045   HGB 9.9 (L) 05/17/2017 0855    HCT 33.4 (L) 11/20/2019 0433   HCT 32.8 (L) 05/17/2017 0855   PLT 208 11/20/2019 0433   PLT 239 11/08/2019 1045   PLT 357 05/17/2017 0855   MCV 82.5 11/20/2019 0433   MCV 70.5 (L) 05/17/2017 0855   MCH 24.4 (L) 11/20/2019 0433   MCHC 29.6 (L) 11/20/2019 0433   RDW 16.9 (H) 11/20/2019 0433   RDW 24.3 (H) 05/17/2017 0855   LYMPHSABS 1.5 11/20/2019 0433   LYMPHSABS 1.2 05/17/2017 0855   MONOABS 0.5 11/20/2019 0433  MONOABS 0.5 05/17/2017 0855   EOSABS 0.2 11/20/2019 0433   EOSABS 0.3 05/17/2017 0855   BASOSABS 0.0 11/20/2019 0433   BASOSABS 0.1 05/17/2017 0855    CMP     Component Value Date/Time   NA 139 11/20/2019 0433   NA 140 05/17/2017 0855   K 4.2 11/20/2019 0433   K 4.0 05/17/2017 0855   CL 103 11/20/2019 0433   CO2 26 11/20/2019 0433   CO2 24 05/17/2017 0855   GLUCOSE 131 (H) 11/20/2019 0433   GLUCOSE 153 (H) 05/17/2017 0855   BUN 16 11/20/2019 0433   BUN 18.9 05/17/2017 0855   CREATININE 1.01 (H) 11/20/2019 0433   CREATININE 1.04 (H) 11/20/2019 0433   CREATININE 1.03 (H) 07/19/2019 1340   CREATININE 1.2 (H) 05/17/2017 0855   CALCIUM 9.9 11/20/2019 0433   CALCIUM 10.0 05/17/2017 0855   PROT 6.9 11/19/2019 1317   PROT 6.9 05/17/2017 0855   ALBUMIN 3.9 11/19/2019 1317   ALBUMIN 3.6 05/17/2017 0855   AST 20 11/19/2019 1317   AST 77 (H) 07/19/2019 1340   AST 15 05/17/2017 0855   ALT 23 11/19/2019 1317   ALT 119 (H) 07/19/2019 1340   ALT 16 05/17/2017 0855   ALKPHOS 159 (H) 11/19/2019 1317   ALKPHOS 121 05/17/2017 0855   BILITOT 0.7 11/19/2019 1317   BILITOT 0.5 07/19/2019 1340   BILITOT <0.22 05/17/2017 0855   GFRNONAA 57 (L) 11/20/2019 0433   GFRNONAA 55 (L) 11/20/2019 0433   GFRNONAA 55 (L) 07/19/2019 1340   GFRAA >60 11/20/2019 0433   GFRAA >60 11/20/2019 0433   GFRAA >60 07/19/2019 1340    Lipid Panel     Component Value Date/Time   CHOL 179 11/20/2019 0400   TRIG 267 (H) 11/20/2019 0400   HDL 35 (L) 11/20/2019 0400   CHOLHDL 5.1  11/20/2019 0400   VLDL 53 (H) 11/20/2019 0400   LDLCALC 91 11/20/2019 0400     Imaging I have reviewed the images obtained:  CT head shows-edema centered within the posterior left temporal region likely reflecting acute to subacute infarction.  MRI brain-acute posterior left MCA territory infarct without mass-effect.  Small focus of petechial hemorrhage with mild cytotoxic edema.  Chronic ischemic microangiopathy.  Etta Quill PA-C [facility] [phone]  M-F  (9:00 am- 5:00 PM)  11/20/2019, 9:17 AM   I have seen the patient and reviewed the note.   Assessment:  This is a 69 year old female who presented to the hospital with headache and difficulty expressing herself.  MRI brain does confirm a posterior left MCA territory infarct.  On exam she continues to have difficulty with word finding and nonfluent speech.  Impression: -Tobacco abuse -Word finding difficulties -Stroke  Recommend -MRA Head, carotid doppler  -Transthoracic Echo,  -Start patient on ASA 325mg  daily  -Increase atorvastatin 80 mg/other high intensity statin -BP goal: At this point patient may be normotensive with systolic <751 -Telemetry monitoring -Frequent neuro checks -NPO until passes stroke swallow screen -PT/OT # please page stroke NP  Or  PA  Or MD from 8am -4 pm  as this patient from this time will be  followed by the stroke.   You can look them up on [email or web address]  Password [email or web address]  Roland Rack, MD [facility] [phone]  If 7pm- 7am, please page neurology on call as listed in [place].

## 2019-11-20 NOTE — Progress Notes (Signed)
PT Cancellation Note  Patient Details Name: TARSHA BLANDO MRN: [id number] DOB: Feb 15, 1951   Cancelled Treatment:    Reason Eval/Treat Not Completed: Other (comment)per RN, possible transfer [facility]. Will check back later as schedule permits for evaluation.    Claretha Cooper 11/20/2019, 10:35 AM [place] Pager [phone] Office [phone]

## 2019-11-20 NOTE — Evaluation (Signed)
Clinical/Bedside Swallow Evaluation Patient Details  Name: Bethany [name] MRN: [id number] Date of Birth: 05/25/1950  Today's Date: 11/20/2019 Time: SLP Start Time (ACUTE ONLY): 48 SLP Stop Time (ACUTE ONLY): 1520 SLP Time Calculation (min) (ACUTE ONLY): 16 min  Past Medical History:  Past Medical History:  Diagnosis Date  . Acute renal failure ([place])   . Arthritis   . Diabetes mellitus without complication ([place])    type II   . Hypercalcemia   . Hyperlipidemia   . Hypertension   . Renal disorder   . Single kidney   . Thrombosis   . Tobacco abuse    Past Surgical History:  Past Surgical History:  Procedure Laterality Date  . ABDOMINAL HYSTERECTOMY    . blood clots removed from descending aorta     . CHOLECYSTECTOMY    . COLONOSCOPY    . COLONOSCOPY WITH PROPOFOL N/A 04/17/2017   Procedure: COLONOSCOPY WITH PROPOFOL;  Surgeon: Wilford Corner, MD;  Location: [facility] ENDOSCOPY;  Service: Endoscopy;  Laterality: N/A;  . ESOPHAGOGASTRODUODENOSCOPY (EGD) WITH PROPOFOL N/A 04/17/2017   Procedure: ESOPHAGOGASTRODUODENOSCOPY (EGD) WITH PROPOFOL;  Surgeon: Wilford Corner, MD;  Location: [facility] ENDOSCOPY;  Service: Endoscopy;  Laterality: N/A;  . IR IMAGING GUIDED PORT INSERTION  05/01/2019  . NEPHRECTOMY RECIPIENT     HPI:  Bethany [name] is a 69 y.o. female with history of thrombosis, hypertension, hyperlipidemia, hypercalcemia, diabetes and tobacco abuse admitted with sudden headache followed by significant difficulty getting her words out. MRI brain-acute posterior left MCA territory infarct without mass-effect.  Small focus of petechial hemorrhage with mild cytotoxic edema.  Chronic ischemic microangiopathy.   Assessment / Plan / Recommendation Clinical Impression  Pt seen in [facility] ED for swallow assessment. Pleasant pt with noteable aphasia with increased utterances/ conversation with appropriate questions re: stroke, language and prognosis. Slight right sided facial asymmetry noted.  Swallow integrity intact across thin via cup/straw, puree and solid consistency with one mild cough following straw presentation. No respiratory factors present to increase risk. Therapist will order regular texture, thin, straws allowed, pills with thin (whole in puree if difficulty present). Potential for dysphagia with acute stroke and ST will plan to follow up once more for safety with recommendations and reiteration of strategies. Speech-language-cognitive evaluation will be completed as well.     SLP Visit Diagnosis: Dysphagia, unspecified (R13.10)    Aspiration Risk  Mild aspiration risk    Diet Recommendation Regular;Thin liquid   Liquid Administration via: Cup;Straw Medication Administration: Whole meds with liquid Supervision: Patient able to self feed Compensations: Slow rate;Small sips/bites Postural Changes: Seated upright at 90 degrees    Other  Recommendations Oral Care Recommendations: Oral care BID   Follow up Recommendations None      Frequency and Duration min 2x/week  2 weeks       Prognosis Prognosis for Safe Diet Advancement: Good      Swallow Study   General HPI: Bethany [name] is a 69 y.o. female with history of thrombosis, hypertension, hyperlipidemia, hypercalcemia, diabetes and tobacco abuse admitted with sudden headache followed by significant difficulty getting her words out. MRI brain-acute posterior left MCA territory infarct without mass-effect.  Small focus of petechial hemorrhage with mild cytotoxic edema.  Chronic ischemic microangiopathy. Type of Study: Bedside Swallow Evaluation Previous Swallow Assessment:  (none found) Diet Prior to this Study: NPO Temperature Spikes Noted: No Respiratory Status: Room air History of Recent Intubation: No Behavior/Cognition: Alert;Cooperative;Pleasant mood Oral Cavity Assessment: Within Functional Limits Oral Care Completed  by SLP: No Oral Cavity - Dentition: Dentures, bottom;Dentures, top Vision:  Functional for self-feeding Self-Feeding Abilities: Able to feed self Patient Positioning: Upright in bed Baseline Vocal Quality: Normal Volitional Cough: Strong Volitional Swallow: Able to elicit    Oral/Motor/Sensory Function Overall Oral Motor/Sensory Function: Mild impairment Facial ROM: Reduced right;Suspected CN VII (facial) dysfunction (slight) Facial Symmetry: Within Functional Limits;Suspected CN VII (facial) dysfunction   Ice Chips Ice chips: Not tested   Thin Liquid Thin Liquid: Impaired Presentation: Cup;Straw Oral Phase Impairments:  (WFL) Pharyngeal  Phase Impairments: Cough - Immediate    Nectar Thick Nectar Thick Liquid: Not tested   Honey Thick Honey Thick Liquid: Not tested   Puree Puree: Within functional limits   Solid     Solid: Within functional limits      Houston Siren 11/20/2019,3:33 PM  Orbie Pyo Colvin Caroli.Ed Risk analyst [phone] Office [phone]

## 2019-11-20 NOTE — Progress Notes (Signed)
PROGRESS NOTE    Bethany [name]   ONG:[id number]  DOB: [birth year]  DOA: 11/19/2019     1  PCP: Wenda Low, MD  CC: difficulty with speech  Hospital Course: Bethany [name] is a 69 yo AA female with PMH DMII with neuropathy, hypertension, hyperlipidemia who presented to the ER with difficulty with speech.  The patient started having trouble finding her words on Saturday, but other family reported noticing her symptoms even a couple days prior.  Beginning on Saturday, her symptoms started to progressively worsen and family convinced her to come to the ER for further evaluation. The patient also endorses changes in sensation on the right side of her body.  She does have noticeable expressive aphasia which slowly showed improvement since ER admission.   CT head obtained in the ER revealed findings in the left temporal region suggestive of ischemic stroke. MRI brain also obtained which confirmed: "Acute posterior left MCA territory infarct without mass effect. Small focus of petechial hemorrhage with mild cytotoxic edema.   Interval History:  No events overnight. Her expressive aphasia is still present but improved some this am. She does note that her right side now feels a little weaker than yesterday and she had difficulty ambulating with assistance last night. She was "leaning to the right". Paresthesias are still present.   Old records reviewed in assessment of this patient  ROS: Constitutional: negative for chills, fatigue and fevers, Eyes: negative for visual disturbance, Respiratory: negative for cough, Cardiovascular: negative for chest pain and Gastrointestinal: negative for abdominal pain  Assessment & Plan: CVA (cerebral vascular accident) ([place]) - per MRI brain: "Acute posterior left MCA territory infarct without mass effect. Small focus of petechial hemorrhage with mild cytotoxic edema" - hold off on asa/plavix until seen evaluated by neuro; d/c Lovenox - Patient also states she is  not on ASA at home 2/2 history of AVM bleed when she used to live in [place] and her GI provider at that time took her off aspirin -[facility] placed -will need neuro consult upon arrival -continue statin - follow up echo and carotid u/s - follow up PT, OT, SLP  Diabetes mellitus type 2 with neurological manifestations ([place]) - okay to resume gabapentin - follow up A1c = 5.9% - SSI for now  Essential hypertension - labetalol or hydralazine PRN  Antimicrobials: n/a  DVT prophylaxis: Discontinue for now Code Status: Full Family Communication: Spoke with family on phone in ER Disposition Plan:  . Patient came from: home        . Barriers to d/c OR conditions which need to be met to effect a safe d/c: TBD . The current disposition plan is discharge to: Home pending PT eval   Objective: Vitals:   11/20/19 0600 11/20/19 0630 11/20/19 0700 11/20/19 0803  BP: (!) 134/54 (!) 132/57 (!) 129/54 (!) 173/75  Pulse: 60 60 (!) 58 62  Resp: '18 18 18 18  ' Temp:    97.9 F (36.6 C)  TempSrc:    Oral  SpO2: 94% 93% 93% 98%    Intake/Output Summary (Last 24 hours) at 11/20/2019 0825 Last data filed at 11/20/2019 0018 Gross per 24 hour  Intake 1198.85 ml  Output --  Net 1198.85 ml   There were no vitals filed for this visit.  Examination: General appearance: Pleasant appearing adult woman resting in bed in no distress Head: Normocephalic, without obvious abnormality Eyes: PERRL, EOMI Lungs: clear to auscultation bilaterally Heart: regular rate and rhythm and S1,  S2 normal Abdomen: normal findings: bowel sounds normal and soft, non-tender Extremities: No edema Skin: mobility and turgor normal Neurologic: Paresthesia appreciated in right upper (imrpoved from admission) and lower extremity. Paresthesia also appreciated in right V1 and V2 distribution.  No obvious dysmetria.  Expressive aphasia appreciated still but is improved from 7/6 exam.  Strength is 5/5 throughout  Consultants:    Needs neuro on transfer to [facility]  Procedures:   n/a  Data Reviewed: I have personally reviewed following labs and imaging studies Results for orders placed or performed during the hospital encounter of 11/19/19 (from the past 72 hour(s))  Comprehensive metabolic panel     Status: Abnormal   Collection Time: 11/19/19  1:17 PM  Result Value Ref Range   Sodium 137 135 - 145 mmol/L   Potassium 4.2 3.5 - 5.1 mmol/L   Chloride 100 98 - 111 mmol/L   CO2 26 22 - 32 mmol/L   Glucose, Bld 184 (H) 70 - 99 mg/dL    Comment: Glucose reference range applies only to samples taken after fasting for at least 8 hours.   BUN 20 8 - 23 mg/dL   Creatinine, Ser 1.56 (H) 0.44 - 1.00 mg/dL   Calcium 9.9 8.9 - 10.3 mg/dL   Total Protein 6.9 6.5 - 8.1 g/dL   Albumin 3.9 3.5 - 5.0 g/dL   AST 20 15 - 41 U/L   ALT 23 0 - 44 U/L   Alkaline Phosphatase 159 (H) 38 - 126 U/L   Total Bilirubin 0.7 0.3 - 1.2 mg/dL   GFR calc non Af Amer 34 (L) >60 mL/min   GFR calc Af Amer 39 (L) >60 mL/min   Anion gap 11 5 - 15    Comment: Performed at [facility], [place] [address], [place], [place] [address]  CBC with Differential     Status: Abnormal   Collection Time: 11/19/19  1:17 PM  Result Value Ref Range   WBC 8.8 4.0 - 10.5 K/uL   RBC 4.27 3.87 - 5.11 MIL/uL   Hemoglobin 10.1 (L) 12.0 - 15.0 g/dL   HCT 34.9 (L) 36 - 46 %   MCV 81.7 80.0 - 100.0 fL   MCH 23.7 (L) 26.0 - 34.0 pg   MCHC 28.9 (L) 30.0 - 36.0 g/dL   RDW 17.0 (H) 11.5 - 15.5 %   Platelets 238 150 - 400 K/uL   nRBC 0.0 0.0 - 0.2 %   Neutrophils Relative % 69 %   Neutro Abs 6.0 1.7 - 7.7 K/uL   Lymphocytes Relative 20 %   Lymphs Abs 1.7 0.7 - 4.0 K/uL   Monocytes Relative 9 %   Monocytes Absolute 0.8 0 - 1 K/uL   Eosinophils Relative 2 %   Eosinophils Absolute 0.2 0 - 0 K/uL   Basophils Relative 0 %   Basophils Absolute 0.0 0 - 0 K/uL   Immature Granulocytes 0 %   Abs Immature Granulocytes 0.03 0.00 - 0.07 K/uL    Comment:  Performed at [facility], [place] [address] [place], [place] [address]  Ethanol     Status: None   Collection Time: 11/19/19  1:17 PM  Result Value Ref Range   Alcohol, Ethyl (B) <10 <10 mg/dL    Comment: (NOTE) Lowest detectable limit for serum alcohol is 10 mg/dL.  For medical purposes only. Performed at [facility], [place] [address] [place], [place] [address]   Protime-INR     Status:  None   Collection Time: 11/19/19  1:17 PM  Result Value Ref Range   Prothrombin Time 12.6 11.4 - 15.2 seconds   INR 1.0 0.8 - 1.2    Comment: (NOTE) INR goal varies based on device and disease states. Performed at [facility], [place] [address], [place], [place] [address]   APTT     Status: None   Collection Time: 11/19/19  1:17 PM  Result Value Ref Range   aPTT 26 24 - 36 seconds    Comment: Performed at [facility], [place] [address] [place], [place] [address]  SARS Coronavirus 2 by RT PCR (hospital order, performed in [facility] hospital lab) Nasopharyngeal Nasopharyngeal Swab     Status: None   Collection Time: 11/19/19  3:06 PM   Specimen: Nasopharyngeal Swab  Result Value Ref Range   SARS Coronavirus 2 NEGATIVE NEGATIVE    Comment: (NOTE) SARS-CoV-2 target nucleic acids are NOT DETECTED.  The SARS-CoV-2 RNA is generally detectable in upper and lower respiratory specimens during the acute phase of infection. The lowest concentration of SARS-CoV-2 viral copies this assay can detect is 250 copies / mL. A negative result does not preclude SARS-CoV-2 infection and should not be used as the sole basis for treatment or other patient management decisions.  A negative result may occur with improper specimen collection / handling, submission of specimen other than nasopharyngeal swab, presence of viral mutation(s) within the areas targeted by this assay, and inadequate number of viral copies (<250 copies / mL). A negative  result must be combined with clinical observations, patient history, and epidemiological information.  Fact Sheet for Patients:   StrictlyIdeas.no  Fact Sheet for Healthcare Providers: BankingDealers.co.za  This test is not yet approved or  cleared by the [place] FDA and has been authorized for detection and/or diagnosis of SARS-CoV-2 by FDA under an Emergency Use Authorization (EUA).  This EUA will remain in effect (meaning this test can be used) for the duration of the COVID-19 declaration under Section 564(b)(1) of the Act, 21 U.S.C. section 360bbb-3(b)(1), unless the authorization is terminated or revoked sooner.  Performed at [facility], [place] [address], [place], [place] [address]   Hemoglobin A1c     Status: Abnormal   Collection Time: 11/19/19  5:39 PM  Result Value Ref Range   Hgb A1c MFr Bld 5.9 (H) 4.8 - 5.6 %    Comment: (NOTE) Pre diabetes:          5.7%-6.4%  Diabetes:              >6.4%  Glycemic control for   <7.0% adults with diabetes    Mean Plasma Glucose 122.63 mg/dL    Comment: Performed at [place] [address], [place], [place] [address]  CBG monitoring, ED     Status: None   Collection Time: 11/19/19  5:55 PM  Result Value Ref Range   Glucose-Capillary 83 70 - 99 mg/dL    Comment: Glucose reference range applies only to samples taken after fasting for at least 8 hours.  Urinalysis, Routine w reflex microscopic     Status: Abnormal   Collection Time: 11/19/19  8:30 PM  Result Value Ref Range   Color, Urine YELLOW YELLOW   APPearance CLEAR CLEAR   Specific Gravity, Urine 1.013 1.005 - 1.030   pH 5.0 5.0 - 8.0   Glucose, UA NEGATIVE NEGATIVE mg/dL   Hgb urine dipstick NEGATIVE NEGATIVE  Bilirubin Urine NEGATIVE NEGATIVE   Ketones, ur NEGATIVE NEGATIVE mg/dL   Protein, ur NEGATIVE NEGATIVE mg/dL   Nitrite POSITIVE (A) NEGATIVE   Leukocytes,Ua TRACE (A) NEGATIVE     RBC / HPF 0-5 0 - 5 RBC/hpf   WBC, UA 0-5 0 - 5 WBC/hpf   Bacteria, UA MANY (A) NONE SEEN   Squamous Epithelial / LPF 0-5 0 - 5   Mucus PRESENT    Hyaline Casts, UA PRESENT     Comment: Performed at [facility], [place] [address] [place], [place] [address]  Urine rapid drug screen (hosp performed)     Status: Abnormal   Collection Time: 11/19/19  8:30 PM  Result Value Ref Range   Opiates POSITIVE (A) NONE DETECTED   Cocaine NONE DETECTED NONE DETECTED   Benzodiazepines NONE DETECTED NONE DETECTED   Amphetamines NONE DETECTED NONE DETECTED   Tetrahydrocannabinol NONE DETECTED NONE DETECTED   Barbiturates NONE DETECTED NONE DETECTED    Comment: (NOTE) DRUG SCREEN FOR MEDICAL PURPOSES ONLY.  IF CONFIRMATION IS NEEDED FOR ANY PURPOSE, NOTIFY LAB WITHIN 5 DAYS.  LOWEST DETECTABLE LIMITS FOR URINE DRUG SCREEN Drug Class                     Cutoff (ng/mL) Amphetamine and metabolites    1000 Barbiturate and metabolites    200 Benzodiazepine                 276 Tricyclics and metabolites     300 Opiates and metabolites        300 Cocaine and metabolites        300 THC                            50 Performed at [facility], [place] [address], [place],  [address]   CBG monitoring, ED     Status: Abnormal   Collection Time: 11/19/19 10:38 PM  Result Value Ref Range   Glucose-Capillary 173 (H) 70 - 99 mg/dL    Comment: Glucose reference range applies only to samples taken after fasting for at least 8 hours.  Lipid panel     Status: Abnormal   Collection Time: 11/20/19  4:00 AM  Result Value Ref Range   Cholesterol 179 0 - 200 mg/dL   Triglycerides 267 (H) <150 mg/dL   HDL 35 (L) >40 mg/dL   Total CHOL/HDL Ratio 5.1 RATIO   VLDL 53 (H) 0 - 40 mg/dL   LDL Cholesterol 91 0 - 99 mg/dL    Comment:        Total Cholesterol/HDL:CHD Risk Coronary Heart Disease Risk Table                     Men   Women  1/2 Average Risk   3.4   3.3   Average Risk       5.0   4.4  2 X Average Risk   9.6   7.1  3 X Average Risk  23.4   11.0        Use the calculated Patient Ratio above and the CHD Risk Table to determine the patient's CHD Risk.        ATP III CLASSIFICATION (LDL):  <100     mg/dL   Optimal  100-129  mg/dL   Near or Above  Optimal  130-159  mg/dL   Borderline  160-189  mg/dL   High  >190     mg/dL   Very High Performed at [place] [address] [place], [place] [address]   TSH     Status: None   Collection Time: 11/20/19  4:33 AM  Result Value Ref Range   TSH 3.079 0.350 - 4.500 uIU/mL    Comment: Performed by a 3rd Generation assay with a functional sensitivity of <=0.01 uIU/mL. Performed at [facility], [place] [address] [place], [place] [address]   Basic metabolic panel     Status: Abnormal   Collection Time: 11/20/19  4:33 AM  Result Value Ref Range   Sodium 139 135 - 145 mmol/L   Potassium 4.2 3.5 - 5.1 mmol/L   Chloride 103 98 - 111 mmol/L   CO2 26 22 - 32 mmol/L   Glucose, Bld 131 (H) 70 - 99 mg/dL    Comment: Glucose reference range applies only to samples taken after fasting for at least 8 hours.   BUN 16 8 - 23 mg/dL   Creatinine, Ser 1.01 (H) 0.44 - 1.00 mg/dL   Calcium 9.9 8.9 - 10.3 mg/dL   GFR calc non Af Amer 57 (L) >60 mL/min   GFR calc Af Amer >60 >60 mL/min   Anion gap 10 5 - 15    Comment: Performed at [facility], [place] [address] [place], [place] [address]  CBC with Differential/Platelet     Status: Abnormal   Collection Time: 11/20/19  4:33 AM  Result Value Ref Range   WBC 7.0 4.0 - 10.5 K/uL   RBC 4.05 3.87 - 5.11 MIL/uL   Hemoglobin 9.9 (L) 12.0 - 15.0 g/dL   HCT 33.4 (L) 36 - 46 %   MCV 82.5 80.0 - 100.0 fL   MCH 24.4 (L) 26.0 - 34.0 pg   MCHC 29.6 (L) 30.0 - 36.0 g/dL   RDW 16.9 (H) 11.5 - 15.5 %   Platelets 208 150 - 400 K/uL   nRBC 0.0 0.0 - 0.2 %   Neutrophils Relative % 70 %   Neutro  Abs 4.9 1.7 - 7.7 K/uL   Lymphocytes Relative 21 %   Lymphs Abs 1.5 0.7 - 4.0 K/uL   Monocytes Relative 7 %   Monocytes Absolute 0.5 0 - 1 K/uL   Eosinophils Relative 2 %   Eosinophils Absolute 0.2 0 - 0 K/uL   Basophils Relative 0 %   Basophils Absolute 0.0 0 - 0 K/uL   Immature Granulocytes 0 %   Abs Immature Granulocytes 0.01 0.00 - 0.07 K/uL    Comment: Performed at [facility], [place] [address], [place], [place] [address]  Magnesium     Status: None   Collection Time: 11/20/19  4:33 AM  Result Value Ref Range   Magnesium 1.8 1.7 - 2.4 mg/dL    Comment: Performed at [facility], [place] [address] [place], [place] [address]  Creatinine, serum     Status: Abnormal   Collection Time: 11/20/19  4:33 AM  Result Value Ref Range   Creatinine, Ser 1.04 (H) 0.44 - 1.00 mg/dL   GFR calc non Af Amer 55 (L) >60 mL/min   GFR calc Af Amer >60 >60 mL/min    Comment: Performed at [facility], [place] [address], [place],  [address]  CBG monitoring, ED     Status: Abnormal  Collection Time: 11/20/19  8:01 AM  Result Value Ref Range   Glucose-Capillary 114 (H) 70 - 99 mg/dL    Comment: Glucose reference range applies only to samples taken after fasting for at least 8 hours.     Recent Results (from the past 240 hour(s))  SARS Coronavirus 2 by RT PCR (hospital order, performed in [facility] hospital lab) Nasopharyngeal Nasopharyngeal Swab     Status: None   Collection Time: 11/19/19  3:06 PM   Specimen: Nasopharyngeal Swab  Result Value Ref Range Status   SARS Coronavirus 2 NEGATIVE NEGATIVE Final    Comment: (NOTE) SARS-CoV-2 target nucleic acids are NOT DETECTED.  The SARS-CoV-2 RNA is generally detectable in upper and lower respiratory specimens during the acute phase of infection. The lowest concentration of SARS-CoV-2 viral copies this assay can detect is 250 copies / mL. A negative result does not preclude SARS-CoV-2  infection and should not be used as the sole basis for treatment or other patient management decisions.  A negative result may occur with improper specimen collection / handling, submission of specimen other than nasopharyngeal swab, presence of viral mutation(s) within the areas targeted by this assay, and inadequate number of viral copies (<250 copies / mL). A negative result must be combined with clinical observations, patient history, and epidemiological information.  Fact Sheet for Patients:   StrictlyIdeas.no  Fact Sheet for Healthcare Providers: BankingDealers.co.za  This test is not yet approved or  cleared by the [place] FDA and has been authorized for detection and/or diagnosis of SARS-CoV-2 by FDA under an Emergency Use Authorization (EUA).  This EUA will remain in effect (meaning this test can be used) for the duration of the COVID-19 declaration under Section 564(b)(1) of the Act, 21 U.S.C. section 360bbb-3(b)(1), unless the authorization is terminated or revoked sooner.  Performed at [facility], [place] [address], [place], [place] [address]      Radiology Studies: CT Head Wo Contrast  Result Date: 11/19/2019 CLINICAL DATA:  Headache, aphasia, left-sided weakness EXAM: CT HEAD WITHOUT CONTRAST TECHNIQUE: Contiguous axial images were obtained from the base of the skull through the vertex without intravenous contrast. COMPARISON:  None. FINDINGS: Brain: There is hypoattenuation in the posterior left temporal region with loss of gray-white differentiation. There is mild regional mass effect. No acute intracranial hemorrhage. Ventricles and sulci are normal in size and configuration. There is no extra-axial fluid collection. Vascular: No hyperdense vessel. There is mild intracranial atherosclerotic calcification at the skull base. Skull: Unremarkable. Sinuses/Orbits: Aerated.  No significant orbital  abnormality. Other: Inferior right mastoid opacification. IMPRESSION: Edema centered within the posterior left temporal region likely reflecting acute to subacute infarction. MRI is recommended for further evaluation. No acute intracranial hemorrhage or significant mass effect. Electronically Signed   By: Macy Mis M.D.   On: 11/19/2019 13:29   MR BRAIN WO CONTRAST  Result Date: 11/19/2019 CLINICAL DATA:  Subacute infarct.  Slurred speech and headaches. EXAM: MRI HEAD WITHOUT CONTRAST TECHNIQUE: Multiplanar, multiecho pulse sequences of the brain and surrounding structures were obtained without intravenous contrast. COMPARISON:  Head CT 11/19/2019 FINDINGS: Brain: There is abnormal diffusion restriction within the posterior left MCA territory, involving the posterior temporal lobe. There is mild cytotoxic edema. Small focus of petechial hemorrhage at the superior extent of the infarct. Multifocal white matter hyperintensity, most commonly due to chronic ischemic microangiopathy. Normal volume of CSF spaces. No chronic microhemorrhage. Normal midline structures. Vascular: Normal flow voids. Skull and upper cervical spine:  Normal marrow signal. Sinuses/Orbits: Right mastoid effusion.  Normal orbits. Other: None IMPRESSION: 1. Acute posterior left MCA territory infarct without mass effect. Small focus of petechial hemorrhage with mild cytotoxic edema. 2. Chronic ischemic microangiopathy. 3. Right mastoid effusion. Electronically Signed   By: Ulyses Jarred M.D.   On: 11/19/2019 19:14   MR BRAIN WO CONTRAST  Final Result    CT Head Wo Contrast  Final Result    VAS US CAROTID (at [facility] and [facility] only)    (Results Pending)     Scheduled Meds: . allopurinol  300 mg Oral Daily  . atorvastatin  40 mg Oral Daily  . gabapentin  600 mg Oral QID  . insulin aspart  0-15 Units Subcutaneous TID AC & HS  . levothyroxine  75 mcg Oral Daily  . oxyCODONE ER  1 capsule Oral BID  . pantoprazole  40 mg Oral Daily    PRN Meds: acetaminophen **OR** acetaminophen (TYLENOL) oral liquid 160 mg/5 mL **OR** acetaminophen, baclofen, hydrALAZINE, labetalol, senna-docusate Continuous Infusions: . sodium chloride Stopped (11/20/19 0018)      LOS: 1 day  Time spent: Greater than 50% of the 35 minute visit was spent in counseling/coordination of care for the patient as laid out in the A&P.   Dwyane Dee, MD [facility] 11/20/2019, 8:25 AM   Contact via secure chat.  To contact the attending provider between 7A-7P or the covering provider during after hours 7P-7A, please log into the web site [email or web address] and access using universal [place] password for that web site. If you do not have the password, please call the hospital operator.

## 2019-11-20 NOTE — ED Notes (Signed)
ED TO INPATIENT HANDOFF REPORT  Name/Age/Gender Bethany [name] 69 y.o. female  Code Status    Code Status Orders  (From admission, onward)         Start     Ordered   11/19/19 1747  Full code  Continuous        11/19/19 1746        Code Status History    Date Active Date Inactive Code Status Order ID Comments User Context   03/27/2019 1710 03/30/2019 1843 DNR [id number]  Desiree Hane, MD ED   03/13/2017 1632 03/17/2017 2006 Full Code [id number]  Elease Hashimoto ED   Advance Care Planning Activity    Advance Directive Documentation     Most Recent Value  Type of Advance Directive Healthcare Power of Attorney, Living will  Pre-existing out of facility DNR order (yellow form or pink MOST form) --  "MOST" Form in Place? --      Home/SNF/Other Home  Chief Complaint CVA (cerebral vascular accident) ([place]) [I63.9]  Level of Care/Admitting Diagnosis ED Disposition    ED Disposition Condition [place]: [place] [[id number]] Level of Care: Telemetry Medical [104] May admit patient to [facility] or [facility] if equivalent level of care is available:: No Covid Evaluation: COVID negative Diagnosis: CVA (cerebral vascula r accident) [facility]) [[id number]] Admitting Physician: Dwyane Dee [place].Hoar Attending Physician: Dwyane Dee [phone] Estimated length of stay: past midnight tomorrow Certification:: I certify this patient will need inpatient services for at least 2 midni ghts       Medical History Past Medical History:  Diagnosis Date  . Acute renal failure ([place])   . Arthritis   . Diabetes mellitus without complication ([place])    type II   . Hypercalcemia   . Hyperlipidemia   . Hypertension   . Renal disorder   . Single kidney   . Thrombosis   . Tobacco abuse     Allergies Allergies  Allergen Reactions  . Contrast Media [Iodinated Diagnostic Agents]     Renal hypertension per physician    IV  Location/Drains/Wounds Patient Lines/Drains/Airways Status    Active Line/Drains/Airways    Name Placement date Placement time Site Days   Implanted Port 05/01/19 Right Chest 05/01/19  1430  Chest  203          Labs/Imaging Results for orders placed or performed during the hospital encounter of 11/19/19 (from the past 48 hour(s))  Comprehensive metabolic panel     Status: Abnormal   Collection Time: 11/19/19  1:17 PM  Result Value Ref Range   Sodium 137 135 - 145 mmol/L   Potassium 4.2 3.5 - 5.1 mmol/L   Chloride 100 98 - 111 mmol/L   CO2 26 22 - 32 mmol/L   Glucose, Bld 184 (H) 70 - 99 mg/dL    Comment: Glucose reference range applies only to samples taken after fasting for at least 8 hours.   BUN 20 8 - 23 mg/dL   Creatinine, Ser 1.56 (H) 0.44 - 1.00 mg/dL   Calcium 9.9 8.9 - 10.3 mg/dL   Total Protein 6.9 6.5 - 8.1 g/dL   Albumin 3.9 3.5 - 5.0 g/dL   AST 20 15 - 41 U/L   ALT 23 0 - 44 U/L   Alkaline Phosphatase 159 (H) 38 - 126 U/L   Total Bilirubin 0.7 0.3 - 1.2 mg/dL   GFR calc non Af Amer 34 (L) >60 mL/min  GFR calc Af Amer 39 (L) >60 mL/min   Anion gap 11 5 - 15    Comment: Performed at [facility], [place] [address] [place], [place] [address]  CBC with Differential     Status: Abnormal   Collection Time: 11/19/19  1:17 PM  Result Value Ref Range   WBC 8.8 4.0 - 10.5 K/uL   RBC 4.27 3.87 - 5.11 MIL/uL   Hemoglobin 10.1 (L) 12.0 - 15.0 g/dL   HCT 34.9 (L) 36 - 46 %   MCV 81.7 80.0 - 100.0 fL   MCH 23.7 (L) 26.0 - 34.0 pg   MCHC 28.9 (L) 30.0 - 36.0 g/dL   RDW 17.0 (H) 11.5 - 15.5 %   Platelets 238 150 - 400 K/uL   nRBC 0.0 0.0 - 0.2 %   Neutrophils Relative % 69 %   Neutro Abs 6.0 1.7 - 7.7 K/uL   Lymphocytes Relative 20 %   Lymphs Abs 1.7 0.7 - 4.0 K/uL   Monocytes Relative 9 %   Monocytes Absolute 0.8 0 - 1 K/uL   Eosinophils Relative 2 %   Eosinophils Absolute 0.2 0 - 0 K/uL   Basophils Relative 0 %   Basophils Absolute 0.0 0 - 0  K/uL   Immature Granulocytes 0 %   Abs Immature Granulocytes 0.03 0.00 - 0.07 K/uL    Comment: Performed at [facility], [place] [address] [place], [place] [address]  Ethanol     Status: None   Collection Time: 11/19/19  1:17 PM  Result Value Ref Range   Alcohol, Ethyl (B) <10 <10 mg/dL    Comment: (NOTE) Lowest detectable limit for serum alcohol is 10 mg/dL.  For medical purposes only. Performed at [facility], [place] [address] [place], [place] [address]   Protime-INR     Status: None   Collection Time: 11/19/19  1:17 PM  Result Value Ref Range   Prothrombin Time 12.6 11.4 - 15.2 seconds   INR 1.0 0.8 - 1.2    Comment: (NOTE) INR goal varies based on device and disease states. Performed at [facility], [place] [address], [place], [place] [address]   APTT     Status: None   Collection Time: 11/19/19  1:17 PM  Result Value Ref Range   aPTT 26 24 - 36 seconds    Comment: Performed at [facility], [place] [address] [place],  [address]  SARS Coronavirus 2 by RT PCR (hospital order, performed in [facility] hospital lab) Nasopharyngeal Nasopharyngeal Swab     Status: None   Collection Time: 11/19/19  3:06 PM   Specimen: Nasopharyngeal Swab  Result Value Ref Range   SARS Coronavirus 2 NEGATIVE NEGATIVE    Comment: (NOTE) SARS-CoV-2 target nucleic acids are NOT DETECTED.  The SARS-CoV-2 RNA is generally detectable in upper and lower respiratory specimens during the acute phase of infection. The lowest concentration of SARS-CoV-2 viral copies this assay can detect is 250 copies / mL. A negative result does not preclude SARS-CoV-2 infection and should not be used as the sole basis for treatment or other patient management decisions.  A negative result may occur with improper specimen collection / handling, submission of specimen other than nasopharyngeal swab, presence of viral mutation(s) within  the areas targeted by this assay, and inadequate number of viral copies (<250 copies / mL). A negative result must be combined with clinical observations, patient history, and epidemiological information.  Fact Sheet for Patients:   StrictlyIdeas.no  Fact Sheet for Healthcare Providers: BankingDealers.co.za  This test is not yet approved or  cleared by the [place] FDA and has been authorized for detection and/or diagnosis of SARS-CoV-2 by FDA under an Emergency Use Authorization (EUA).  This EUA will remain in effect (meaning this test can be used) for the duration of the COVID-19 declaration under Section 564(b)(1) of the Act, 21 U.S.C. section 360bbb-3(b)(1), unless the authorization is terminated or revoked sooner.  Performed at [facility], [place] [address] [place], [place] [address]   Hemoglobin A1c     Status: Abnormal   Collection Time: 11/19/19  5:39 PM  Result Value Ref Range   Hgb A1c MFr Bld 5.9 (H) 4.8 - 5.6 %    Comment: (NOTE) Pre diabetes:          5.7%-6.4%  Diabetes:              >6.4%  Glycemic control for   <7.0% adults with diabetes    Mean Plasma Glucose 122.63 mg/dL    Comment: Performed at [place] [address], [place], [place] [address]  CBG monitoring, ED     Status: None   Collection Time: 11/19/19  5:55 PM  Result Value Ref Range   Glucose-Capillary 83 70 - 99 mg/dL    Comment: Glucose reference range applies only to samples taken after fasting for at least 8 hours.  Urinalysis, Routine w reflex microscopic     Status: Abnormal   Collection Time: 11/19/19  8:30 PM  Result Value Ref Range   Color, Urine YELLOW YELLOW   APPearance CLEAR CLEAR   Specific Gravity, Urine 1.013 1.005 - 1.030   pH 5.0 5.0 - 8.0   Glucose, UA NEGATIVE NEGATIVE mg/dL   Hgb urine dipstick NEGATIVE NEGATIVE   Bilirubin Urine NEGATIVE NEGATIVE   Ketones, ur NEGATIVE NEGATIVE mg/dL    Protein, ur NEGATIVE NEGATIVE mg/dL   Nitrite POSITIVE (A) NEGATIVE   Leukocytes,Ua TRACE (A) NEGATIVE   RBC / HPF 0-5 0 - 5 RBC/hpf   WBC, UA 0-5 0 - 5 WBC/hpf   Bacteria, UA MANY (A) NONE SEEN   Squamous Epithelial / LPF 0-5 0 - 5   Mucus PRESENT    Hyaline Casts, UA PRESENT     Comment: Performed at [facility], [place] [address], [place], [place] [address]  Urine rapid drug screen (hosp performed)     Status: Abnormal   Collection Time: 11/19/19  8:30 PM  Result Value Ref Range   Opiates POSITIVE (A) NONE DETECTED   Cocaine NONE DETECTED NONE DETECTED   Benzodiazepines NONE DETECTED NONE DETECTED   Amphetamines NONE DETECTED NONE DETECTED   Tetrahydrocannabinol NONE DETECTED NONE DETECTED   Barbiturates NONE DETECTED NONE DETECTED    Comment: (NOTE) DRUG SCREEN FOR MEDICAL PURPOSES ONLY.  IF CONFIRMATION IS NEEDED FOR ANY PURPOSE, NOTIFY LAB WITHIN 5 DAYS.  LOWEST DETECTABLE LIMITS FOR URINE DRUG SCREEN Drug Class                     Cutoff (ng/mL) Amphetamine and metabolites    1000 Barbiturate and metabolites    200 Benzodiazepine                 742 Tricyclics and metabolites     300 Opiates and metabolites        300 Cocaine and metabolites  300 THC                            50 Performed at [facility], [place] [address] [place], [place] [address]   CBG monitoring, ED     Status: Abnormal   Collection Time: 11/19/19 10:38 PM  Result Value Ref Range   Glucose-Capillary 173 (H) 70 - 99 mg/dL    Comment: Glucose reference range applies only to samples taken after fasting for at least 8 hours.  Lipid panel     Status: Abnormal   Collection Time: 11/20/19  4:00 AM  Result Value Ref Range   Cholesterol 179 0 - 200 mg/dL   Triglycerides 267 (H) <150 mg/dL   HDL 35 (L) >40 mg/dL   Total CHOL/HDL Ratio 5.1 RATIO   VLDL 53 (H) 0 - 40 mg/dL   LDL Cholesterol 91 0 - 99 mg/dL    Comment:        Total Cholesterol/HDL:CHD  Risk Coronary Heart Disease Risk Table                     Men   Women  1/2 Average Risk   3.4   3.3  Average Risk       5.0   4.4  2 X Average Risk   9.6   7.1  3 X Average Risk  23.4   11.0        Use the calculated Patient Ratio above and the CHD Risk Table to determine the patient's CHD Risk.        ATP III CLASSIFICATION (LDL):  <100     mg/dL   Optimal  100-129  mg/dL   Near or Above                    Optimal  130-159  mg/dL   Borderline  160-189  mg/dL   High  >190     mg/dL   Very High Performed at [place] [address], [place], [place] [address]   TSH     Status: None   Collection Time: 11/20/19  4:33 AM  Result Value Ref Range   TSH 3.079 0.350 - 4.500 uIU/mL    Comment: Performed by a 3rd Generation assay with a functional sensitivity of <=0.01 uIU/mL. Performed at [facility], [place] [address], [place], [place] [address]   Basic metabolic panel     Status: Abnormal   Collection Time: 11/20/19  4:33 AM  Result Value Ref Range   Sodium 139 135 - 145 mmol/L   Potassium 4.2 3.5 - 5.1 mmol/L   Chloride 103 98 - 111 mmol/L   CO2 26 22 - 32 mmol/L   Glucose, Bld 131 (H) 70 - 99 mg/dL    Comment: Glucose reference range applies only to samples taken after fasting for at least 8 hours.   BUN 16 8 - 23 mg/dL   Creatinine, Ser 1.01 (H) 0.44 - 1.00 mg/dL   Calcium 9.9 8.9 - 10.3 mg/dL   GFR calc non Af Amer 57 (L) >60 mL/min   GFR calc Af Amer >60 >60 mL/min   Anion gap 10 5 - 15    Comment: Performed at [facility], [place] [address], [place], [place] [address]  CBC with Differential/Platelet     Status: Abnormal   Collection Time: 11/20/19  4:33 AM  Result Value  Ref Range   WBC 7.0 4.0 - 10.5 K/uL   RBC 4.05 3.87 - 5.11 MIL/uL   Hemoglobin 9.9 (L) 12.0 - 15.0 g/dL   HCT 33.4 (L) 36 - 46 %   MCV 82.5 80.0 - 100.0 fL   MCH 24.4 (L) 26.0 - 34.0 pg   MCHC 29.6 (L) 30.0 - 36.0 g/dL   RDW 16.9 (H) 11.5 -  15.5 %   Platelets 208 150 - 400 K/uL   nRBC 0.0 0.0 - 0.2 %   Neutrophils Relative % 70 %   Neutro Abs 4.9 1.7 - 7.7 K/uL   Lymphocytes Relative 21 %   Lymphs Abs 1.5 0.7 - 4.0 K/uL   Monocytes Relative 7 %   Monocytes Absolute 0.5 0 - 1 K/uL   Eosinophils Relative 2 %   Eosinophils Absolute 0.2 0 - 0 K/uL   Basophils Relative 0 %   Basophils Absolute 0.0 0 - 0 K/uL   Immature Granulocytes 0 %   Abs Immature Granulocytes 0.01 0.00 - 0.07 K/uL    Comment: Performed at [facility], [place] [address] [place], [place] [address]  Magnesium     Status: None   Collection Time: 11/20/19  4:33 AM  Result Value Ref Range   Magnesium 1.8 1.7 - 2.4 mg/dL    Comment: Performed at [facility], [place] [address] [place], [place] [address]  Hemoglobin A1c     Status: Abnormal   Collection Time: 11/20/19  4:33 AM  Result Value Ref Range   Hgb A1c MFr Bld 5.7 (H) 4.8 - 5.6 %    Comment: (NOTE) Pre diabetes:          5.7%-6.4%  Diabetes:              >6.4%  Glycemic control for   <7.0% adults with diabetes    Mean Plasma Glucose 116.89 mg/dL    Comment: Performed at [place] [address], [place], [place] [address]  Creatinine, serum     Status: Abnormal   Collection Time: 11/20/19  4:33 AM  Result Value Ref Range   Creatinine, Ser 1.04 (H) 0.44 - 1.00 mg/dL   GFR calc non Af Amer 55 (L) >60 mL/min   GFR calc Af Amer >60 >60 mL/min    Comment: Performed at [facility], [place] [address] [place], [place] [address]  CBG monitoring, ED     Status: Abnormal   Collection Time: 11/20/19  8:01 AM  Result Value Ref Range   Glucose-Capillary 114 (H) 70 - 99 mg/dL    Comment: Glucose reference range applies only to samples taken after fasting for at least 8 hours.  CBG monitoring, ED     Status: Abnormal   Collection Time: 11/20/19 12:02 PM  Result Value Ref Range   Glucose-Capillary 155 (H) 70 - 99 mg/dL    Comment: Glucose  reference range applies only to samples taken after fasting for at least 8 hours.  CBG monitoring, ED     Status: Abnormal   Collection Time: 11/20/19  5:24 PM  Result Value Ref Range   Glucose-Capillary 130 (H) 70 - 99 mg/dL    Comment: Glucose reference range applies only to samples taken after fasting for at least 8 hours.   CT Head Wo Contrast  Result Date: 11/19/2019 CLINICAL DATA:  Headache, aphasia, left-sided weakness EXAM: CT HEAD WITHOUT CONTRAST TECHNIQUE: Contiguous axial images were obtained from the base of the  skull through the vertex without intravenous contrast. COMPARISON:  None. FINDINGS: Brain: There is hypoattenuation in the posterior left temporal region with loss of gray-white differentiation. There is mild regional mass effect. No acute intracranial hemorrhage. Ventricles and sulci are normal in size and configuration. There is no extra-axial fluid collection. Vascular: No hyperdense vessel. There is mild intracranial atherosclerotic calcification at the skull base. Skull: Unremarkable. Sinuses/Orbits: Aerated.  No significant orbital abnormality. Other: Inferior right mastoid opacification. IMPRESSION: Edema centered within the posterior left temporal region likely reflecting acute to subacute infarction. MRI is recommended for further evaluation. No acute intracranial hemorrhage or significant mass effect. Electronically Signed   By: Macy Mis M.D.   On: 11/19/2019 13:29   MR ANGIO HEAD WO CONTRAST  Result Date: 11/20/2019 CLINICAL DATA:  Stroke, follow-up. EXAM: MRA HEAD WITHOUT CONTRAST TECHNIQUE: Angiographic images of the Circle of Willis were obtained using MRA technique without intravenous contrast. COMPARISON:  Brain MRI performed one day prior 11/19/2019. FINDINGS: The intracranial internal carotid arteries are patent without significant stenosis. The M1 middle cerebral arteries are patent without significant stenosis. No M2 proximal branch occlusion is identified.  There is a high-grade stenosis within a distal M2/proximal M3 left MCA branch vessel in the region of a known acute/early subacute infarct. The anterior cerebral arteries are patent without significant stenosis. There is a 5-6 mm superiorly projecting aneurysm arising from the left MCA bifurcation (series 8, image 81). 1-2 mm inferiorly projecting vascular protrusion arising from the supraclinoid left internal carotid artery which may reflect a tiny aneurysm or infundibulum (series 8, image 92). The intracranial vertebral arteries are patent without significant stenosis, as is the basilar artery. The posterior cerebral arteries are patent bilaterally without significant proximal stenosis. There are apparent moderate/severe stenoses within P3 posterior cerebral artery branches bilaterally (series 102, image 15) (series 102, image four). Right mastoid effusion. IMPRESSION: No intracranial large vessel occlusion. There is a high-grade stenosis within a distal M2/proximal M3 left MCA branch vessel in the region of a known acute/early subacute infarct affecting the left temporoparietal lobes. Apparent moderate/severe stenoses within P3 posterior cerebral artery branches bilaterally. 5-6 MM SUPERIORLY PROJECTING ANEURYSM ARISING FROM THE LEFT MCA BIFURCATION. NEUROSURGICAL/NEUROINTERVENTIONAL CONSULTATION IS RECOMMENDED. 1-2 mm inferiorly projecting vascular protrusion arising from the supraclinoid left ICA, which may reflect a tiny aneurysm or infundibulum. Right mastoid effusion. Electronically Signed   By: Kellie Simmering DO   On: 11/20/2019 17:46   MR BRAIN WO CONTRAST  Result Date: 11/19/2019 CLINICAL DATA:  Subacute infarct.  Slurred speech and headaches. EXAM: MRI HEAD WITHOUT CONTRAST TECHNIQUE: Multiplanar, multiecho pulse sequences of the brain and surrounding structures were obtained without intravenous contrast. COMPARISON:  Head CT 11/19/2019 FINDINGS: Brain: There is abnormal diffusion restriction within the  posterior left MCA territory, involving the posterior temporal lobe. There is mild cytotoxic edema. Small focus of petechial hemorrhage at the superior extent of the infarct. Multifocal white matter hyperintensity, most commonly due to chronic ischemic microangiopathy. Normal volume of CSF spaces. No chronic microhemorrhage. Normal midline structures. Vascular: Normal flow voids. Skull and upper cervical spine: Normal marrow signal. Sinuses/Orbits: Right mastoid effusion.  Normal orbits. Other: None IMPRESSION: 1. Acute posterior left MCA territory infarct without mass effect. Small focus of petechial hemorrhage with mild cytotoxic edema. 2. Chronic ischemic microangiopathy. 3. Right mastoid effusion. Electronically Signed   By: Ulyses Jarred M.D.   On: 11/19/2019 19:14   ECHOCARDIOGRAM COMPLETE  Result Date: 11/20/2019    ECHOCARDIOGRAM REPORT   Patient  Name:   JAVAYAH MAGAW Date of Exam: 11/20/2019 Medical Rec #:  [id number]     Height:       65.0 in Accession #:    [id number]    Weight:       178.7 lb Date of Birth:  08-18-1950     BSA:          1.886 m Patient Age:    42 years      BP:           159/54 mmHg Patient Gender: F             HR:           66 bpm. Exam Location:  Inpatient Procedure: 2D Echo Indications:    Stroke  History:        Patient has prior history of Echocardiogram examinations, most                 recent 03/28/2019. Arrythmias:PAC; Risk Factors:Hypertension,                 Diabetes, Dyslipidemia and Current Smoker.  Sonographer:    Mikki Santee RDCS (AE) Referring Phys: [place]  1. Left ventricular ejection fraction, by estimation, is 60 to 65%. The left ventricle has normal function. The left ventricle has no regional wall motion abnormalities. There is mild left ventricular hypertrophy. Left ventricular diastolic parameters are consistent with Grade II diastolic dysfunction (pseudonormalization). Elevated left ventricular end-diastolic pressure.  2. Right  ventricular systolic function is normal. The right ventricular size is normal.  3. The mitral valve is normal in structure. Trivial mitral valve regurgitation. No evidence of mitral stenosis.  4. The aortic valve is tricuspid. Aortic valve regurgitation is not visualized. Mild aortic valve sclerosis is present, with no evidence of aortic valve stenosis.  5. The inferior vena cava is normal in size with greater than 50% respiratory variability, suggesting right atrial pressure of 3 mmHg. FINDINGS  Left Ventricle: Left ventricular ejection fraction, by estimation, is 60 to 65%. The left ventricle has normal function. The left ventricle has no regional wall motion abnormalities. The left ventricular internal cavity size was normal in size. There is  mild left ventricular hypertrophy. Left ventricular diastolic parameters are consistent with Grade II diastolic dysfunction (pseudonormalization). Elevated left ventricular end-diastolic pressure. Right Ventricle: The right ventricular size is normal. No increase in right ventricular wall thickness. Right ventricular systolic function is normal. Left Atrium: Left atrial size was normal in size. Right Atrium: Right atrial size was normal in size. Pericardium: There is no evidence of pericardial effusion. Mitral Valve: The mitral valve is normal in structure. Normal mobility of the mitral valve leaflets. Trivial mitral valve regurgitation. No evidence of mitral valve stenosis. Tricuspid Valve: The tricuspid valve is normal in structure. Tricuspid valve regurgitation is not demonstrated. No evidence of tricuspid stenosis. Aortic Valve: The aortic valve is tricuspid. Aortic valve regurgitation is not visualized. Mild aortic valve sclerosis is present, with no evidence of aortic valve stenosis. Pulmonic Valve: The pulmonic valve was normal in structure. Pulmonic valve regurgitation is not visualized. No evidence of pulmonic stenosis. Aorta: The aortic root is normal in size and  structure. Venous: The inferior vena cava is normal in size with greater than 50% respiratory variability, suggesting right atrial pressure of 3 mmHg. IAS/Shunts: No atrial level shunt detected by color flow Doppler.  LEFT VENTRICLE PLAX 2D LVIDd:         4.90 cm  Diastology LVIDs:         2.60 cm  LV e' lateral:   6.20 cm/s LV PW:         1.20 cm  LV E/e' lateral: 19.7 LV IVS:        1.20 cm  LV e' medial:    5.66 cm/s LVOT diam:     2.00 cm  LV E/e' medial:  21.6 LV SV:         110 LV SV Index:   58 LVOT Area:     3.14 cm  RIGHT VENTRICLE RV S prime:     11.00 cm/s TAPSE (M-mode): 2.2 cm LEFT ATRIUM             Index       RIGHT ATRIUM           Index LA diam:        3.40 cm 1.80 cm/m  RA Area:     12.00 cm LA Vol (A2C):   46.6 ml 24.71 ml/m RA Volume:   22.40 ml  11.88 ml/m LA Vol (A4C):   96.6 ml 51.23 ml/m LA Biplane Vol: 71.1 ml 37.70 ml/m  AORTIC VALVE LVOT Vmax:   161.00 cm/s LVOT Vmean:  96.700 cm/s LVOT VTI:    0.350 m  AORTA Ao Root diam: 3.10 cm MITRAL VALVE MV Area (PHT): 2.95 cm     SHUNTS MV Decel Time: 257 msec     Systemic VTI:  0.35 m MV E velocity: 122.00 cm/s  Systemic Diam: 2.00 cm MV A velocity: 96.60 cm/s MV E/A ratio:  1.26 Jenkins Rouge MD Electronically signed by Jenkins Rouge MD Signature Date/Time: 11/20/2019/1:41:45 PM    Final     Pending Labs Unresulted Labs (From admission, onward) Comment          Start     Ordered   11/26/19 0500  Creatinine, serum  (enoxaparin (LOVENOX)    CrCl >/= 30 ml/min)  Weekly,   R     Comments: while on enoxaparin therapy    11/19/19 1746   11/20/19 8416  Basic metabolic panel  Daily,   R      11/19/19 1746   11/20/19 0500  CBC with Differential/Platelet  Daily,   R      11/19/19 1746   11/20/19 0500  Magnesium  Daily,   R      11/19/19 1746          Vitals/Pain Today's Vitals   11/20/19 1500 11/20/19 1501 11/20/19 1530 11/20/19 1630  BP: (!) 153/53  (!) 143/89 (!) 143/78  Pulse: 63  63 64  Resp: 18  15 18   Temp:       TempSrc:      SpO2: 95% 90% 93% 97%  PainSc:        Isolation Precautions No active isolations  Medications Medications  0.9 %  sodium chloride infusion ( Intravenous Stopped 11/20/19 0018)  acetaminophen (TYLENOL) tablet 650 mg (has no administration in time range)    Or  acetaminophen (TYLENOL) 160 MG/5ML solution 650 mg (has no administration in time range)    Or  acetaminophen (TYLENOL) suppository 650 mg (has no administration in time range)  senna-docusate (Senokot-S) tablet 1 tablet (has no administration in time range)  labetalol (NORMODYNE) injection 10 mg (has no administration in time range)  hydrALAZINE (APRESOLINE) tablet 25 mg (has no administration in time range)  gabapentin (NEURONTIN) capsule 600 mg (600 mg Oral Given 11/20/19 1827)  insulin aspart (novoLOG) injection 0-15 Units (2 Units Subcutaneous Given 11/20/19 1735)  allopurinol (ZYLOPRIM) tablet 300 mg (300 mg Oral Given 11/20/19 1312)  atorvastatin (LIPITOR) tablet 40 mg (40 mg Oral Given 11/20/19 1312)  baclofen (LIORESAL) tablet 10 mg (has no administration in time range)  levothyroxine (SYNTHROID) tablet 75 mcg (has no administration in time range)  pantoprazole (PROTONIX) EC tablet 40 mg (40 mg Oral Given 11/20/19 1120)  oxyCODONE (OXYCONTIN) 12 hr tablet 20 mg (20 mg Oral Given 11/20/19 1120)  sodium chloride 0.9 % bolus 500 mL (0 mLs Intravenous Stopped 11/19/19 1515)   stroke: mapping our early stages of recovery book (1 each Does not apply Given 11/19/19 1831)  morphine 2 MG/ML injection 2 mg (2 mg Intravenous Given 11/20/19 0442)    Mobility walks

## 2019-11-21 ENCOUNTER — Inpatient Hospital Stay (HOSPITAL_COMMUNITY): Payer: Medicare Other

## 2019-11-21 ENCOUNTER — Encounter (HOSPITAL_COMMUNITY): Payer: Self-pay | Admitting: Internal Medicine

## 2019-11-21 DIAGNOSIS — I639 Cerebral infarction, unspecified: Secondary | ICD-10-CM

## 2019-11-21 LAB — GLUCOSE, CAPILLARY
Glucose-Capillary: 192 mg/dL — ABNORMAL HIGH (ref 70–99)
Glucose-Capillary: 181 mg/dL — ABNORMAL HIGH (ref 70–99)
Glucose-Capillary: 161 mg/dL — ABNORMAL HIGH (ref 70–99)
Glucose-Capillary: 203 mg/dL — ABNORMAL HIGH (ref 70–99)

## 2019-11-21 MED ORDER — POLYETHYLENE GLYCOL 3350 17 G PO PACK
17.0000 g | PACK | Freq: Once | ORAL | Status: AC
  Administered 2019-11-21: 17 g via ORAL
  Filled 2019-11-21: qty 1

## 2019-11-21 MED ORDER — METOPROLOL TARTRATE 50 MG PO TABS
50.0000 mg | ORAL_TABLET | Freq: Two times a day (BID) | ORAL | Status: DC
  Administered 2019-11-21 – 2019-11-22 (×3): 50 mg via ORAL
  Filled 2019-11-21 (×4): qty 1

## 2019-11-21 MED ORDER — INSULIN GLARGINE 100 UNIT/ML ~~LOC~~ SOLN
10.0000 [IU] | Freq: Every day | SUBCUTANEOUS | Status: DC
  Administered 2019-11-21: 10 [IU] via SUBCUTANEOUS
  Filled 2019-11-21 (×2): qty 0.1

## 2019-11-21 MED ORDER — CLOPIDOGREL BISULFATE 75 MG PO TABS
75.0000 mg | ORAL_TABLET | Freq: Every day | ORAL | Status: DC
  Administered 2019-11-22: 75 mg via ORAL
  Filled 2019-11-21: qty 1

## 2019-11-21 MED ORDER — EZETIMIBE 10 MG PO TABS
10.0000 mg | ORAL_TABLET | Freq: Every day | ORAL | Status: DC
  Administered 2019-11-21 – 2019-11-22 (×2): 10 mg via ORAL
  Filled 2019-11-21 (×2): qty 1

## 2019-11-21 MED ORDER — AMLODIPINE BESYLATE 10 MG PO TABS
10.0000 mg | ORAL_TABLET | Freq: Every day | ORAL | Status: DC
  Administered 2019-11-21 – 2019-11-22 (×2): 10 mg via ORAL
  Filled 2019-11-21 (×2): qty 1

## 2019-11-21 MED ORDER — ASPIRIN EC 81 MG PO TBEC
81.0000 mg | DELAYED_RELEASE_TABLET | Freq: Every day | ORAL | Status: DC
  Administered 2019-11-22: 81 mg via ORAL
  Filled 2019-11-21: qty 1

## 2019-11-21 MED ORDER — NALOXONE HCL 0.4 MG/ML IJ SOLN: 0.4000 mg | INTRAMUSCULAR | Status: DC | PRN

## 2019-11-21 MED ORDER — SENNOSIDES-DOCUSATE SODIUM 8.6-50 MG PO TABS
1.0000 | ORAL_TABLET | Freq: Two times a day (BID) | ORAL | Status: DC
  Administered 2019-11-21 – 2019-11-22 (×2): 1 via ORAL
  Filled 2019-11-21 (×2): qty 1

## 2019-11-21 NOTE — Progress Notes (Signed)
Per PT, patient became unresponsive when ambulating in the hallway. Upon arrival, patient was having trouble keeping her eyes open. Patient wheeled to bed and VS taken. Paged Dr. Loleta Books, order for orthostatic VS. Patient returned to baseline once she returned to bed.

## 2019-11-21 NOTE — Consult Note (Signed)
Physical Medicine and Rehabilitation Consult  Reason for Consult: Stroke with functional deficits.  Referring Physician: Dr. Loleta Books.    HPI: Bethany [name] is a 69 y.o. female with history of HTN, T2DM, chronic pain/neuropathy, lung nodule, solitary kidney who was admitted on 11/19/19 with reports of sudden onset of HA on 07/03 followed by progressive difficulty speaking.   MRI brain done revealing acute posterior L-MCA territory infarct without mass effect and small focus of petechial hemorrhage with mild cytotoxic edema. MRA brain showed high grade stenosis distal M2/proximal M3 L-MCA branch in region of acute infarct, 5-6 mm aneursym rising form L-MCA bifurcation, 1-2 mm supraclinoid L-MCA vascular protrusion, moderate to severe stenosis in B-PCA P3 branches. 2D echo showed EF 60-65% with  Grade 2 DD. Stroke work up underway. Therapy evaluations completed today revealing BLE weakness with instability--limited by syncopal episode. ST evaluation revealed expressive type aphasia with dysfluency and mild difficulty with higher level cognitive tasks. CIR recommended due to functional decline.    Review of Systems  All other systems reviewed and are negative.   Past Medical History:  Diagnosis Date  . Acute renal failure ([place])   . Arthritis   . Diabetes mellitus without complication ([place])    type II   . Hypercalcemia   . Hyperlipidemia   . Hypertension   . Renal disorder   . Single kidney   . Thrombosis   . Tobacco abuse     Past Surgical History:  Procedure Laterality Date  . ABDOMINAL HYSTERECTOMY    . blood clots removed from descending aorta     . CHOLECYSTECTOMY    . COLONOSCOPY    . COLONOSCOPY WITH PROPOFOL N/A 04/17/2017   Procedure: COLONOSCOPY WITH PROPOFOL;  Surgeon: Wilford Corner, MD;  Location: [facility];  Service: Endoscopy;  Laterality: N/A;  . ESOPHAGOGASTRODUODENOSCOPY (EGD) WITH PROPOFOL N/A 04/17/2017   Procedure: ESOPHAGOGASTRODUODENOSCOPY (EGD)  WITH PROPOFOL;  Surgeon: Wilford Corner, MD;  Location: [facility];  Service: Endoscopy;  Laterality: N/A;  . IR IMAGING GUIDED PORT INSERTION  05/01/2019  . NEPHRECTOMY RECIPIENT      Family History  Problem Relation Age of Onset  . Hypertension Mother   . Diabetes Father   . Hypertension Brother   . Breast cancer Maternal Aunt        45s  . Breast cancer Maternal Aunt 75       colon cancer    Social History:  Lives with family. Independent PTA. Per reports that she has been smoking cigarettes. She has a 10.00 pack-year smoking history. She has never used smokeless tobacco. She reports current alcohol use. She reports that she does not use drugs.   Allergies  Allergen Reactions  . Contrast Media [Iodinated Diagnostic Agents]     Renal hypertension per physician    Medications Prior to Admission  Medication Sig Dispense Refill  . allopurinol (ZYLOPRIM) 300 MG tablet Take 1 tablet (300 mg total) by mouth daily. 30 tablet 0  . amLODipine (NORVASC) 10 MG tablet Take 10 mg by mouth daily.    . APPLE CIDER VINEGAR PO Take 1 tablet by mouth daily.    Marland Kitchen atorvastatin (LIPITOR) 40 MG tablet Take 40 mg by mouth daily.    . baclofen (LIORESAL) 10 MG tablet Take 10 mg by mouth 3 (three) times daily as needed for muscle spasms.    . buprenorphine (BUTRANS) 20 MCG/HR PTWK Place 1 patch onto the skin once a week.     Marland Kitchen  Dulaglutide (TRULICITY) 1.5 DV/7.6HY SOPN Inject 1.5 mg into the skin once a week. Mondays    . ezetimibe (ZETIA) 10 MG tablet Take 10 mg by mouth daily.    . feeding supplement, ENSURE ENLIVE, (ENSURE ENLIVE) LIQD Take 237 mLs by mouth 2 (two) times daily between meals. 237 mL 12  . gabapentin (NEURONTIN) 600 MG tablet Take 600 mg by mouth 4 (four) times daily.     . Insulin Glargine (TOUJEO SOLOSTAR) 300 UNIT/ML SOPN Inject 34 Units into the skin at bedtime.     . insulin lispro (HUMALOG KWIKPEN) 100 UNIT/ML KiwkPen Inject 24 Units into the skin daily with supper.     .  Ipratropium-Albuterol (COMBIVENT) 20-100 MCG/ACT AERS respimat Inhale 1 puff into the lungs every 6 (six) hours as needed for wheezing or shortness of breath. 1 Inhaler 0  . levothyroxine (SYNTHROID) 75 MCG tablet Take 75 mcg by mouth daily.    Marland Kitchen lidocaine-prilocaine (EMLA) cream Apply 1 application topically as needed. 30 g 1  . metoprolol (LOPRESSOR) 50 MG tablet Take 50 mg by mouth 2 (two) times daily.     Marland Kitchen oxyCODONE-acetaminophen (PERCOCET) 10-325 MG tablet Take 1 tablet by mouth every 6 (six) hours as needed for pain.    . pantoprazole (PROTONIX) 40 MG tablet Take 40 mg by mouth daily.    . pregabalin (LYRICA) 25 MG capsule Take 25 mg by mouth 2 (two) times daily.     Marland Kitchen senna (SENOKOT) 8.6 MG TABS tablet Take 1 tablet by mouth.    . Vitamin D, Ergocalciferol, (DRISDOL) 1.25 MG (50000 UNIT) CAPS capsule Take 50,000 Units by mouth every 7 (seven) days.    Marland Kitchen XTAMPZA ER 27 MG C12A Take 1 capsule by mouth 2 (two) times daily.    . furosemide (LASIX) 40 MG tablet Take 1 tablet (40 mg total) by mouth daily as needed. 45 tablet 3    Home: Home Living Family/patient expects to be discharged to:: Private residence Living Arrangements: Alone, Other (Comment) (daughter lives across the hall from patient) Available Help at Discharge: Family Type of Home: Apartment Home Access: Stairs to enter Technical brewer of Steps: 3 Entrance Stairs-Rails: None Home Layout: One level Bathroom Shower/Tub: Chiropodist: [place]: Radio producer - single point  Lives With: Alone (dtr lives across the apt breezeway)  Functional History: Prior Function Level of Independence: Independent Comments: got a cane when diagnosed with neuropathy but has not really used it. Functional Status:  Mobility: Bed Mobility Overal bed mobility: Needs Assistance Bed Mobility: Supine to Sit Supine to sit: Supervision General bed mobility comments: used rail, HOB up, supervision for  safety Transfers Overall transfer level: Needs assistance Equipment used: Rolling walker (2 wheeled) Transfers: Sit to/from Stand Sit to Stand: Mod assist, From elevated surface, +2 safety/equipment General transfer comment: assist to power up, +2 safety due to LEs buckling in ED yesterday, VCs hand placement Ambulation/Gait Ambulation/Gait assistance: +2 safety/equipment, Min guard Gait Distance (Feet): 50 Feet Assistive device: Rolling walker (2 wheeled) Gait Pattern/deviations: Step-through pattern, Decreased step length - right, Decreased step length - left General Gait Details: BLEs tremulous and buckling, pt relied heavily on BUE support with RW, after 50' pt became unresponsive, had posterior lean with head a trunk and had to be assisted to a chair then to bed, RN called to room, BP supine 202/78, SaO2 100%, HR 70s, pt became responsive once in bed Gait velocity: decr    ADL:    Cognition: Cognition  Overall Cognitive Status: Within Functional Limits for tasks assessed Orientation Level: Oriented X4 Cognition Arousal/Alertness: Awake/alert Behavior During Therapy: WFL for tasks assessed/performed Overall Cognitive Status: Within Functional Limits for tasks assessed   Blood pressure (!) 174/69, pulse 63, temperature 98.5 F (36.9 C), temperature source Oral, resp. rate 17, height 5\' 5"  (1.651 m), weight 84 kg, SpO2 100 %. Physical Exam  General: Alert and oriented x 3, No apparent distress. Sister at bedside. Ambulated to and from bathroom with unsteady gait HEENT: Head is normocephalic, atraumatic, PERRLA, EOMI, sclera anicteric, oral mucosa pink and moist, dentition intact, ext ear canals clear,  Neck: Supple without JVD or lymphadenopathy Heart: Reg rate and rhythm. No murmurs rubs or gallops Chest: CTA bilaterally without wheezes, rales, or rhonchi; no distress Abdomen: Soft, non-tender, non-distended, bowel sounds positive. Extremities: No clubbing, cyanosis, or edema.  Pulses are 2+ Skin: Clean and intact without signs of breakdown Neuro: Alert and oriented x3. +dysarthria. Word finding difficulties. BUE 4+/5 strength. RLE is 4/5 strength, LLE 4-/5 strength. Psych: Pt's affect is appropriate. Pt is cooperative  Results for orders placed or performed during the hospital encounter of 11/19/19 (from the past 24 hour(s))  CBG monitoring, ED     Status: Abnormal   Collection Time: 11/20/19 12:02 PM  Result Value Ref Range   Glucose-Capillary 155 (H) 70 - 99 mg/dL  CBG monitoring, ED     Status: Abnormal   Collection Time: 11/20/19  5:24 PM  Result Value Ref Range   Glucose-Capillary 130 (H) 70 - 99 mg/dL  Glucose, capillary     Status: Abnormal   Collection Time: 11/20/19  8:46 PM  Result Value Ref Range   Glucose-Capillary 171 (H) 70 - 99 mg/dL   Comment 1 Notify RN    Comment 2 Document in Chart   Glucose, capillary     Status: Abnormal   Collection Time: 11/21/19  5:59 AM  Result Value Ref Range   Glucose-Capillary 192 (H) 70 - 99 mg/dL   Comment 1 Notify RN    Comment 2 Document in Chart   Glucose, capillary     Status: Abnormal   Collection Time: 11/21/19 11:08 AM  Result Value Ref Range   Glucose-Capillary 181 (H) 70 - 99 mg/dL   CT Head Wo Contrast  Result Date: 11/19/2019 CLINICAL DATA:  Headache, aphasia, left-sided weakness EXAM: CT HEAD WITHOUT CONTRAST TECHNIQUE: Contiguous axial images were obtained from the base of the skull through the vertex without intravenous contrast. COMPARISON:  None. FINDINGS: Brain: There is hypoattenuation in the posterior left temporal region with loss of gray-white differentiation. There is mild regional mass effect. No acute intracranial hemorrhage. Ventricles and sulci are normal in size and configuration. There is no extra-axial fluid collection. Vascular: No hyperdense vessel. There is mild intracranial atherosclerotic calcification at the skull base. Skull: Unremarkable. Sinuses/Orbits: Aerated.  No  significant orbital abnormality. Other: Inferior right mastoid opacification. IMPRESSION: Edema centered within the posterior left temporal region likely reflecting acute to subacute infarction. MRI is recommended for further evaluation. No acute intracranial hemorrhage or significant mass effect. Electronically Signed   By: Macy Mis M.D.   On: 11/19/2019 13:29   MR ANGIO HEAD WO CONTRAST  Result Date: 11/20/2019 CLINICAL DATA:  Stroke, follow-up. EXAM: MRA HEAD WITHOUT CONTRAST TECHNIQUE: Angiographic images of the Circle of Willis were obtained using MRA technique without intravenous contrast. COMPARISON:  Brain MRI performed one day prior 11/19/2019. FINDINGS: The intracranial internal carotid arteries are patent without significant stenosis.  The M1 middle cerebral arteries are patent without significant stenosis. No M2 proximal branch occlusion is identified. There is a high-grade stenosis within a distal M2/proximal M3 left MCA branch vessel in the region of a known acute/early subacute infarct. The anterior cerebral arteries are patent without significant stenosis. There is a 5-6 mm superiorly projecting aneurysm arising from the left MCA bifurcation (series 8, image 81). 1-2 mm inferiorly projecting vascular protrusion arising from the supraclinoid left internal carotid artery which may reflect a tiny aneurysm or infundibulum (series 8, image 92). The intracranial vertebral arteries are patent without significant stenosis, as is the basilar artery. The posterior cerebral arteries are patent bilaterally without significant proximal stenosis. There are apparent moderate/severe stenoses within P3 posterior cerebral artery branches bilaterally (series 102, image 15) (series 102, image four). Right mastoid effusion. IMPRESSION: No intracranial large vessel occlusion. There is a high-grade stenosis within a distal M2/proximal M3 left MCA branch vessel in the region of a known acute/early subacute infarct  affecting the left temporoparietal lobes. Apparent moderate/severe stenoses within P3 posterior cerebral artery branches bilaterally. 5-6 MM SUPERIORLY PROJECTING ANEURYSM ARISING FROM THE LEFT MCA BIFURCATION. NEUROSURGICAL/NEUROINTERVENTIONAL CONSULTATION IS RECOMMENDED. 1-2 mm inferiorly projecting vascular protrusion arising from the supraclinoid left ICA, which may reflect a tiny aneurysm or infundibulum. Right mastoid effusion. Electronically Signed   By: Kellie Simmering DO   On: 11/20/2019 17:46   MR BRAIN WO CONTRAST  Result Date: 11/19/2019 CLINICAL DATA:  Subacute infarct.  Slurred speech and headaches. EXAM: MRI HEAD WITHOUT CONTRAST TECHNIQUE: Multiplanar, multiecho pulse sequences of the brain and surrounding structures were obtained without intravenous contrast. COMPARISON:  Head CT 11/19/2019 FINDINGS: Brain: There is abnormal diffusion restriction within the posterior left MCA territory, involving the posterior temporal lobe. There is mild cytotoxic edema. Small focus of petechial hemorrhage at the superior extent of the infarct. Multifocal white matter hyperintensity, most commonly due to chronic ischemic microangiopathy. Normal volume of CSF spaces. No chronic microhemorrhage. Normal midline structures. Vascular: Normal flow voids. Skull and upper cervical spine: Normal marrow signal. Sinuses/Orbits: Right mastoid effusion.  Normal orbits. Other: None IMPRESSION: 1. Acute posterior left MCA territory infarct without mass effect. Small focus of petechial hemorrhage with mild cytotoxic edema. 2. Chronic ischemic microangiopathy. 3. Right mastoid effusion. Electronically Signed   By: Ulyses Jarred M.D.   On: 11/19/2019 19:14   ECHOCARDIOGRAM COMPLETE  Result Date: 11/20/2019    ECHOCARDIOGRAM REPORT   Patient Name:   Bethany [name] Date of Exam: 11/20/2019 Medical Rec #:  [id number]     Height:       65.0 in Accession #:    [id number]    Weight:       178.7 lb Date of Birth:  12/17/1950     BSA:           1.886 m Patient Age:    49 years      BP:           159/54 mmHg Patient Gender: F             HR:           66 bpm. Exam Location:  Inpatient Procedure: 2D Echo Indications:    Stroke  History:        Patient has prior history of Echocardiogram examinations, most                 recent 03/28/2019. Arrythmias:PAC; Risk Factors:Hypertension,  Diabetes, Dyslipidemia and Current Smoker.  Sonographer:    Mikki Santee RDCS (AE) Referring Phys: [place]  1. Left ventricular ejection fraction, by estimation, is 60 to 65%. The left ventricle has normal function. The left ventricle has no regional wall motion abnormalities. There is mild left ventricular hypertrophy. Left ventricular diastolic parameters are consistent with Grade II diastolic dysfunction (pseudonormalization). Elevated left ventricular end-diastolic pressure.  2. Right ventricular systolic function is normal. The right ventricular size is normal.  3. The mitral valve is normal in structure. Trivial mitral valve regurgitation. No evidence of mitral stenosis.  4. The aortic valve is tricuspid. Aortic valve regurgitation is not visualized. Mild aortic valve sclerosis is present, with no evidence of aortic valve stenosis.  5. The inferior vena cava is normal in size with greater than 50% respiratory variability, suggesting right atrial pressure of 3 mmHg. FINDINGS  Left Ventricle: Left ventricular ejection fraction, by estimation, is 60 to 65%. The left ventricle has normal function. The left ventricle has no regional wall motion abnormalities. The left ventricular internal cavity size was normal in size. There is  mild left ventricular hypertrophy. Left ventricular diastolic parameters are consistent with Grade II diastolic dysfunction (pseudonormalization). Elevated left ventricular end-diastolic pressure. Right Ventricle: The right ventricular size is normal. No increase in right ventricular wall thickness. Right  ventricular systolic function is normal. Left Atrium: Left atrial size was normal in size. Right Atrium: Right atrial size was normal in size. Pericardium: There is no evidence of pericardial effusion. Mitral Valve: The mitral valve is normal in structure. Normal mobility of the mitral valve leaflets. Trivial mitral valve regurgitation. No evidence of mitral valve stenosis. Tricuspid Valve: The tricuspid valve is normal in structure. Tricuspid valve regurgitation is not demonstrated. No evidence of tricuspid stenosis. Aortic Valve: The aortic valve is tricuspid. Aortic valve regurgitation is not visualized. Mild aortic valve sclerosis is present, with no evidence of aortic valve stenosis. Pulmonic Valve: The pulmonic valve was normal in structure. Pulmonic valve regurgitation is not visualized. No evidence of pulmonic stenosis. Aorta: The aortic root is normal in size and structure. Venous: The inferior vena cava is normal in size with greater than 50% respiratory variability, suggesting right atrial pressure of 3 mmHg. IAS/Shunts: No atrial level shunt detected by color flow Doppler.  LEFT VENTRICLE PLAX 2D LVIDd:         4.90 cm  Diastology LVIDs:         2.60 cm  LV e' lateral:   6.20 cm/s LV PW:         1.20 cm  LV E/e' lateral: 19.7 LV IVS:        1.20 cm  LV e' medial:    5.66 cm/s LVOT diam:     2.00 cm  LV E/e' medial:  21.6 LV SV:         110 LV SV Index:   58 LVOT Area:     3.14 cm  RIGHT VENTRICLE RV S prime:     11.00 cm/s TAPSE (M-mode): 2.2 cm LEFT ATRIUM             Index       RIGHT ATRIUM           Index LA diam:        3.40 cm 1.80 cm/m  RA Area:     12.00 cm LA Vol (A2C):   46.6 ml 24.71 ml/m RA Volume:   22.40 ml  11.88 ml/m LA  Vol (A4C):   96.6 ml 51.23 ml/m LA Biplane Vol: 71.1 ml 37.70 ml/m  AORTIC VALVE LVOT Vmax:   161.00 cm/s LVOT Vmean:  96.700 cm/s LVOT VTI:    0.350 m  AORTA Ao Root diam: 3.10 cm MITRAL VALVE MV Area (PHT): 2.95 cm     SHUNTS MV Decel Time: 257 msec     Systemic  VTI:  0.35 m MV E velocity: 122.00 cm/s  Systemic Diam: 2.00 cm MV A velocity: 96.60 cm/s MV E/A ratio:  1.26 Jenkins Rouge MD Electronically signed by Jenkins Rouge MD Signature Date/Time: 11/20/2019/1:41:45 PM    Final      Assessment/Plan: Diagnosis: L MCA stroke 1. Does the need for close, 24 hr/day medical supervision in concert with the patient's rehab needs make it unreasonable for this patient to be served in a less intensive setting? Yes 2. Co-Morbidities requiring supervision/potential complications: Diabetes Type 2 with neurological manifestations, essential hypertension, iron deficiency anemia, GI AVM, CKD stage 3, chronic headaches, neuropathy, COPD 3. Due to bladder management, bowel management, safety, skin/wound care, disease management, medication administration, pain management and patient education, does the patient require 24 hr/day rehab nursing? Yes 4. Does the patient require coordinated care of a physician, rehab nurse, therapy disciplines of PT, OT, SLP to address physical and functional deficits in the context of the above medical diagnosis(es)? Yes Addressing deficits in the following areas: balance, endurance, locomotion, strength, transferring, bowel/bladder control, bathing, dressing, feeding, grooming, toileting, cognition, speech, language and psychosocial support 5. Can the patient actively participate in an intensive therapy program of at least 3 hrs of therapy per day at least 5 days per week? Yes 6. The potential for patient to make measurable gains while on inpatient rehab is excellent 7. Anticipated functional outcomes upon discharge from inpatient rehab are modified independent  with PT, modified independent with OT, modified independent with SLP. 8. Estimated rehab length of stay to reach the above functional goals is: 8-10 days 9. Anticipated discharge destination: Home 10. Overall Rehab/Functional Prognosis: excellent  RECOMMENDATIONS: This patient's condition  is appropriate for continued rehabilitative care in the following setting: CIR Patient has agreed to participate in recommended program. Yes Note that insurance prior authorization may be required for reimbursement for recommended care.  Comment: Mrs. Coale would be an excellent CIR candidate. She had high level of function at baseline. She lives opposite from her daughter and also has the support of her sister.   Bary Leriche, PA-C 11/21/2019   I have personally performed a face to face diagnostic evaluation, including, but not limited to relevant history and physical exam findings, of this patient and developed relevant assessment and plan.  Additionally, I have reviewed and concur with the physician assistant's documentation above.  Leeroy Cha, MD

## 2019-11-21 NOTE — Progress Notes (Signed)
PROGRESS NOTE    Bethany [name]  VXB:[id number] DOB: 15-Aug-1950 DOA: 11/19/2019 PCP: Bethany Low, MD      Brief Narrative:  Bethany [name] is a 69 y.o. F with chronic pain on Butrans, long-acting oxycodone, history renal vein thrombosis and nephrectomy, complicated by recurrent bleeding AVM, recurrent trsnfusion and chronic iron deficiency anemia, as well as DM, and HTN who presented with acute aphasia.  Patient was at home a few days prior to admission when she started to notice word finding difficulties and paresthesias on the right side of her body.  Eventually family recommended she come to the ER.  In the ER, CT showed findings suggestive of ischemic stroke.  MRI brain confirmed acute posterior left MCA territory infarct.       Assessment & Plan:  Acute embolic LEFT MCA stroke  -Echocardiogram showed no cardiogenic source of embolism -Carotid imaging unremarkable   -Lipids ordered: Increase dose to atorvastatin 80 mg nightly, Zetia -Aspirin ordered at admission --> continue aspirin and Plavix -Atrial fibrillation: Not mild present on monitoring, no monitoring necessary after hospital stay because she is not a anticoagulation candidate -tPA not given because outside the window -Dysphagia screen ordered in ER -PT eval ordered: recommended CIR -Smoking cessation: Non-smoker   Left MCA bifurcation aneurysm Agree with Dr. Leonie Man, will treat conservatively given history of GI bleeding and chronic blood loss, Dr. Leonie Man will arrange follow up with neuro interventionalist as an outpatient  Chronic iron deficiency anemia due to chronic blood loss Hgb stable relative to baseline. Due for feraheme tomorrow -Feraheme once tomorrow  Diabetes, type II, with polyneuropathy Well controlled here -Hold Trulicity -Continue Lantus -Continue sliding scale corrections -Continue gabapentin, Lyrica  Hypertension Blood pressure elevated. -Resume amlodipine, metoprolol  Gout -Continue  allopurinol  Hypothyroidism -Continue levothyroxine  Chronic pain due to arthritis -Continue home Butrans, long-acting oxycodone -Continue home Percocet -Continue baclofen  Vagal-like episode Orthostatics negative.  BP and HR normal on return to bed.  Mentation back to normal on return to bed.  Cardiac monitoring unremarkable.  Unclear nature of episode, but no reason to suspect it was stroke related, and monitoring ruled out urgent cardiac cause. -No further work up           Disposition: Status is: Inpatient  Remains inpatient appropriate because:Unsafe d/c plan   Dispo: The patient is from: Home              Anticipated d/c is to: CIR              Anticipated d/c date is: 1 day              Patient currently is medically stable to d/c.              MDM: The below labs and imaging reports were reviewed and summarized above.  Medication management as above.   DVT prophylaxis: SCDs  Code Status: Full code Family Communication: Sister at the bedside    Consultants:   Neurology  Procedures:   7/6 MRI brain  7/7 MRA head  7/8 echocardiogram  7/8 carotid ultrasound  Antimicrobials:      Culture data:              Subjective: Patient still feeling generalized weakness.  No more paresthesias.  She had a syncope-like episode while working with PT today.  Her legs buckled.  She has some mild aphasia persistent.  No focal weakness.  No delirium.  No fever.  Objective: Vitals:  11/21/19 0734 11/21/19 1051 11/21/19 1058 11/21/19 1205  BP: (!) 150/68 (!) 197/62 (!) 174/69 (!) 182/62  Pulse: 60 69 63 63  Resp: 18 17  16   Temp: 98.5 F (36.9 C) 98.5 F (36.9 C)  98.5 F (36.9 C)  TempSrc: Oral Oral  Oral  SpO2: 100% 100%  99%  Weight:      Height:       No intake or output data in the 24 hours ending 11/21/19 1318 Filed Weights   11/20/19 2210  Weight: 84 kg    Examination: General appearance:  adult female, alert and in  no acute distress.  Lying in bed. HEENT: Anicteric, conjunctiva pink, lids and lashes normal. No nasal deformity, discharge, epistaxis.  Lips moist.   Skin: Warm and dry.   No suspicious rashes or lesions. Cardiac: RRR, nl S1-S2, no murmurs appreciated.  Capillary refill is brisk.  JVP normal.  No LE edema.  Radial  pulses 2+ and symmetric. Respiratory: Normal respiratory rate and rhythm.  CTAB without rales or wheezes. Abdomen: Abdomen soft.  No TTP or guarding. No ascites, distension, hepatosplenomegaly.   MSK: No deformities or effusions. Neuro: Awake and alert.  EOMI, moves all extremities with generalized weakness.  I did not appreciate any weakness on the right side.  No paresthesias on the right side. Speech fluent except she has occasional word finding difficulties, mild.    Psych: Sensorium intact and responding to questions, attention normal. Affect normal.  Judgment and insight appear normal.    Data Reviewed: I have personally reviewed following labs and imaging studies:  CBC: Recent Labs  Lab 11/19/19 1317 11/20/19 0433  WBC 8.8 7.0  NEUTROABS 6.0 4.9  HGB 10.1* 9.9*  HCT 34.9* 33.4*  MCV 81.7 82.5  PLT 238 448   Basic Metabolic Panel: Recent Labs  Lab 11/19/19 1317 11/20/19 0433  NA 137 139  K 4.2 4.2  CL 100 103  CO2 26 26  GLUCOSE 184* 131*  BUN 20 16  CREATININE 1.56* 1.01*  1.04*  CALCIUM 9.9 9.9  MG  --  1.8   GFR: Estimated Creatinine Clearance: 56.3 mL/min (A) (by C-G formula based on SCr of 1.01 mg/dL (H)). Liver Function Tests: Recent Labs  Lab 11/19/19 1317  AST 20  ALT 23  ALKPHOS 159*  BILITOT 0.7  PROT 6.9  ALBUMIN 3.9   No results for input(s): LIPASE, AMYLASE in the last 168 hours. No results for input(s): AMMONIA in the last 168 hours. Coagulation Profile: Recent Labs  Lab 11/19/19 1317  INR 1.0   Cardiac Enzymes: No results for input(s): CKTOTAL, CKMB, CKMBINDEX, TROPONINI in the last 168 hours. BNP (last 3 results) No  results for input(s): PROBNP in the last 8760 hours. HbA1C: Recent Labs    11/19/19 1739 11/20/19 0433  HGBA1C 5.9* 5.7*   CBG: Recent Labs  Lab 11/20/19 1202 11/20/19 1724 11/20/19 2046 11/21/19 0559 11/21/19 1108  GLUCAP 155* 130* 171* 192* 181*   Lipid Profile: Recent Labs    11/20/19 0400  CHOL 179  HDL 35*  LDLCALC 91  TRIG 267*  CHOLHDL 5.1   Thyroid Function Tests: Recent Labs    11/20/19 0433  TSH 3.079   Anemia Panel: No results for input(s): VITAMINB12, FOLATE, FERRITIN, TIBC, IRON, RETICCTPCT in the last 72 hours. Urine analysis:    Component Value Date/Time   COLORURINE YELLOW 11/19/2019 2030   [place] 11/19/2019 2030   LABSPEC 1.013 11/19/2019 2030   PHURINE 5.0  11/19/2019 2030   [place] 11/19/2019 2030   [place] NEGATIVE 11/19/2019 2030   [place] NEGATIVE 11/19/2019 2030   [place] 11/19/2019 2030   PROTEINUR NEGATIVE 11/19/2019 2030   NITRITE POSITIVE (A) 11/19/2019 2030   LEUKOCYTESUR TRACE (A) 11/19/2019 2030   Sepsis Labs: @LABRCNTIP (procalcitonin:4,lacticacidven:4)  ) Recent Results (from the past 240 hour(s))  SARS Coronavirus 2 by RT PCR (hospital order, performed in [facility] hospital lab) Nasopharyngeal Nasopharyngeal Swab     Status: None   Collection Time: 11/19/19  3:06 PM   Specimen: Nasopharyngeal Swab  Result Value Ref Range Status   SARS Coronavirus 2 NEGATIVE NEGATIVE Final    Comment: (NOTE) SARS-CoV-2 target nucleic acids are NOT DETECTED.  The SARS-CoV-2 RNA is generally detectable in upper and lower respiratory specimens during the acute phase of infection. The lowest concentration of SARS-CoV-2 viral copies this assay can detect is 250 copies / mL. A negative result does not preclude SARS-CoV-2 infection and should not be used as the sole basis for treatment or other patient management decisions.  A negative result may occur with improper specimen collection / handling,  submission of specimen other than nasopharyngeal swab, presence of viral mutation(s) within the areas targeted by this assay, and inadequate number of viral copies (<250 copies / mL). A negative result must be combined with clinical observations, patient history, and epidemiological information.  Fact Sheet for Patients:   StrictlyIdeas.no  Fact Sheet for Healthcare Providers: BankingDealers.co.za  This test is not yet approved or  cleared by the [place] FDA and has been authorized for detection and/or diagnosis of SARS-CoV-2 by FDA under an Emergency Use Authorization (EUA).  This EUA will remain in effect (meaning this test can be used) for the duration of the COVID-19 declaration under Section 564(b)(1) of the Act, 21 U.S.C. section 360bbb-3(b)(1), unless the authorization is terminated or revoked sooner.  Performed at [facility], [place] [address], [place], [place] [address]          Radiology Studies: MR ANGIO HEAD WO CONTRAST  Result Date: 11/20/2019 CLINICAL DATA:  Stroke, follow-up. EXAM: MRA HEAD WITHOUT CONTRAST TECHNIQUE: Angiographic images of the Circle of Willis were obtained using MRA technique without intravenous contrast. COMPARISON:  Brain MRI performed one day prior 11/19/2019. FINDINGS: The intracranial internal carotid arteries are patent without significant stenosis. The M1 middle cerebral arteries are patent without significant stenosis. No M2 proximal branch occlusion is identified. There is a high-grade stenosis within a distal M2/proximal M3 left MCA branch vessel in the region of a known acute/early subacute infarct. The anterior cerebral arteries are patent without significant stenosis. There is a 5-6 mm superiorly projecting aneurysm arising from the left MCA bifurcation (series 8, image 81). 1-2 mm inferiorly projecting vascular protrusion arising from the supraclinoid left internal  carotid artery which may reflect a tiny aneurysm or infundibulum (series 8, image 92). The intracranial vertebral arteries are patent without significant stenosis, as is the basilar artery. The posterior cerebral arteries are patent bilaterally without significant proximal stenosis. There are apparent moderate/severe stenoses within P3 posterior cerebral artery branches bilaterally (series 102, image 15) (series 102, image four). Right mastoid effusion. IMPRESSION: No intracranial large vessel occlusion. There is a high-grade stenosis within a distal M2/proximal M3 left MCA branch vessel in the region of a known acute/early subacute infarct affecting the left temporoparietal lobes. Apparent moderate/severe stenoses within P3 posterior cerebral artery branches bilaterally. 5-6 MM SUPERIORLY PROJECTING ANEURYSM ARISING FROM THE LEFT MCA BIFURCATION.  NEUROSURGICAL/NEUROINTERVENTIONAL CONSULTATION IS RECOMMENDED. 1-2 mm inferiorly projecting vascular protrusion arising from the supraclinoid left ICA, which may reflect a tiny aneurysm or infundibulum. Right mastoid effusion. Electronically Signed   By: Kellie Simmering DO   On: 11/20/2019 17:46   MR BRAIN WO CONTRAST  Result Date: 11/19/2019 CLINICAL DATA:  Subacute infarct.  Slurred speech and headaches. EXAM: MRI HEAD WITHOUT CONTRAST TECHNIQUE: Multiplanar, multiecho pulse sequences of the brain and surrounding structures were obtained without intravenous contrast. COMPARISON:  Head CT 11/19/2019 FINDINGS: Brain: There is abnormal diffusion restriction within the posterior left MCA territory, involving the posterior temporal lobe. There is mild cytotoxic edema. Small focus of petechial hemorrhage at the superior extent of the infarct. Multifocal white matter hyperintensity, most commonly due to chronic ischemic microangiopathy. Normal volume of CSF spaces. No chronic microhemorrhage. Normal midline structures. Vascular: Normal flow voids. Skull and upper cervical  spine: Normal marrow signal. Sinuses/Orbits: Right mastoid effusion.  Normal orbits. Other: None IMPRESSION: 1. Acute posterior left MCA territory infarct without mass effect. Small focus of petechial hemorrhage with mild cytotoxic edema. 2. Chronic ischemic microangiopathy. 3. Right mastoid effusion. Electronically Signed   By: Ulyses Jarred M.D.   On: 11/19/2019 19:14   ECHOCARDIOGRAM COMPLETE  Result Date: 11/20/2019    ECHOCARDIOGRAM REPORT   Patient Name:   ANAROSA KUBISIAK Date of Exam: 11/20/2019 Medical Rec #:  [id number]     Height:       65.0 in Accession #:    [id number]    Weight:       178.7 lb Date of Birth:  [birth year]     BSA:          1.886 m Patient Age:    26 years      BP:           159/54 mmHg Patient Gender: F             HR:           66 bpm. Exam Location:  Inpatient Procedure: 2D Echo Indications:    Stroke  History:        Patient has prior history of Echocardiogram examinations, most                 recent 03/28/2019. Arrythmias:PAC; Risk Factors:Hypertension,                 Diabetes, Dyslipidemia and Current Smoker.  Sonographer:    Mikki Santee RDCS (AE) Referring Phys: [place]  1. Left ventricular ejection fraction, by estimation, is 60 to 65%. The left ventricle has normal function. The left ventricle has no regional wall motion abnormalities. There is mild left ventricular hypertrophy. Left ventricular diastolic parameters are consistent with Grade II diastolic dysfunction (pseudonormalization). Elevated left ventricular end-diastolic pressure.  2. Right ventricular systolic function is normal. The right ventricular size is normal.  3. The mitral valve is normal in structure. Trivial mitral valve regurgitation. No evidence of mitral stenosis.  4. The aortic valve is tricuspid. Aortic valve regurgitation is not visualized. Mild aortic valve sclerosis is present, with no evidence of aortic valve stenosis.  5. The inferior vena cava is normal in size with  greater than 50% respiratory variability, suggesting right atrial pressure of 3 mmHg. FINDINGS  Left Ventricle: Left ventricular ejection fraction, by estimation, is 60 to 65%. The left ventricle has normal function. The left ventricle has no regional wall motion abnormalities. The left ventricular internal cavity size was normal in  size. There is  mild left ventricular hypertrophy. Left ventricular diastolic parameters are consistent with Grade II diastolic dysfunction (pseudonormalization). Elevated left ventricular end-diastolic pressure. Right Ventricle: The right ventricular size is normal. No increase in right ventricular wall thickness. Right ventricular systolic function is normal. Left Atrium: Left atrial size was normal in size. Right Atrium: Right atrial size was normal in size. Pericardium: There is no evidence of pericardial effusion. Mitral Valve: The mitral valve is normal in structure. Normal mobility of the mitral valve leaflets. Trivial mitral valve regurgitation. No evidence of mitral valve stenosis. Tricuspid Valve: The tricuspid valve is normal in structure. Tricuspid valve regurgitation is not demonstrated. No evidence of tricuspid stenosis. Aortic Valve: The aortic valve is tricuspid. Aortic valve regurgitation is not visualized. Mild aortic valve sclerosis is present, with no evidence of aortic valve stenosis. Pulmonic Valve: The pulmonic valve was normal in structure. Pulmonic valve regurgitation is not visualized. No evidence of pulmonic stenosis. Aorta: The aortic root is normal in size and structure. Venous: The inferior vena cava is normal in size with greater than 50% respiratory variability, suggesting right atrial pressure of 3 mmHg. IAS/Shunts: No atrial level shunt detected by color flow Doppler.  LEFT VENTRICLE PLAX 2D LVIDd:         4.90 cm  Diastology LVIDs:         2.60 cm  LV e' lateral:   6.20 cm/s LV PW:         1.20 cm  LV E/e' lateral: 19.7 LV IVS:        1.20 cm  LV e'  medial:    5.66 cm/s LVOT diam:     2.00 cm  LV E/e' medial:  21.6 LV SV:         110 LV SV Index:   58 LVOT Area:     3.14 cm  RIGHT VENTRICLE RV S prime:     11.00 cm/s TAPSE (M-mode): 2.2 cm LEFT ATRIUM             Index       RIGHT ATRIUM           Index LA diam:        3.40 cm 1.80 cm/m  RA Area:     12.00 cm LA Vol (A2C):   46.6 ml 24.71 ml/m RA Volume:   22.40 ml  11.88 ml/m LA Vol (A4C):   96.6 ml 51.23 ml/m LA Biplane Vol: 71.1 ml 37.70 ml/m  AORTIC VALVE LVOT Vmax:   161.00 cm/s LVOT Vmean:  96.700 cm/s LVOT VTI:    0.350 m  AORTA Ao Root diam: 3.10 cm MITRAL VALVE MV Area (PHT): 2.95 cm     SHUNTS MV Decel Time: 257 msec     Systemic VTI:  0.35 m MV E velocity: 122.00 cm/s  Systemic Diam: 2.00 cm MV A velocity: 96.60 cm/s MV E/A ratio:  1.26 Jenkins Rouge MD Electronically signed by Jenkins Rouge MD Signature Date/Time: 11/20/2019/1:41:45 PM    Final         Scheduled Meds: . allopurinol  300 mg Oral Daily  . amLODipine  10 mg Oral Daily  . aspirin EC  325 mg Oral Daily  . atorvastatin  80 mg Oral Daily  . [START ON 11/26/2019] buprenorphine  1 patch Transdermal Weekly  . Chlorhexidine Gluconate Cloth  6 each Topical Daily  . ezetimibe  10 mg Oral Daily  . gabapentin  600 mg Oral QID  . insulin aspart  0-15 Units Subcutaneous TID AC & HS  . insulin glargine  10 Units Subcutaneous QHS  . levothyroxine  75 mcg Oral QAC breakfast  . metoprolol tartrate  50 mg Oral BID  . oxyCODONE  20 mg Oral Q12H  . pantoprazole  40 mg Oral Daily  . pregabalin  25 mg Oral BID  . senna-docusate  1 tablet Oral BID   Continuous Infusions: . sodium chloride 75 mL/hr at 11/21/19 0726     LOS: 2 days    Time spent: 35 minutes    Edwin Dada, MD [facility] 11/21/2019, 1:18 PM     Please page though Shea Evans or Epic secure chat:  For [employer], Adult nurse

## 2019-11-21 NOTE — Progress Notes (Signed)
OT Cancellation Note  Patient Details Name: Bethany [name] MRN: [id number] DOB: Jul 31, 1950   Cancelled Treatment:    Reason Eval/Treat Not Completed: Patient at procedure or test/ unavailable;Other (comment) (with speech therapist). Plan to reattempt.   Tyrone Schimke, OT Acute Rehabilitation Services Pager: [phone] Office: [phone]  11/21/2019, 9:30 AM

## 2019-11-21 NOTE — Evaluation (Addendum)
Occupational Therapy Evaluation Patient Details Name: RAYLI WIEDERHOLD MRN: [id number] DOB: [birth year] Today's Date: 11/21/2019    History of Present Illness 69 y.o. female with history of thrombosis, hypertension, hyperlipidemia, hypercalcemia, diabetes admitted with headache and difficulty with speech, onset was 11/16/19. Imaging showed acute posterior L MCA territory infarct.   Clinical Impression   Pt admitted with the above diagnoses and presents with below problem list. Pt will benefit from continued acute OT to address the below listed deficits and maximize independence with basic ADLs prior to d/c to venue below. PTA pt was independent with ADLs. Pt is currently mod A +2 for safety with LB ADLs and transfers, min guard +2 for safety to walk into hallway. Pt with significant episode of LOC accompanied by posterior arching of back and neck while ambulating in the hall with PT/OT. Pt guided into chair, wheeled back to room and transfered back to bed. Nursing staff immediately present, assessing vitals and continuing to monitor pt at end of session. Pt's bp noted to be high on initial reading once back in bed (202/78). Pt's sister present throughout session and reports pt had LOC episode 3 weeks prior while standing at a funeral.      Follow Up Recommendations  CIR;Supervision/Assistance - 24 hour    Equipment Recommendations  Other (comment) (TBD)    Recommendations for Other Services Rehab consult     Precautions / Restrictions Precautions Precautions: Fall Precaution Comments: pt reports LEs buckled in ED, pt became unresponsive while walking today; pt denies h/o prior falls, pt's sister stated pt passed out at a funeral 3 weeks ago Restrictions Weight Bearing Restrictions: No      Mobility Bed Mobility Overal bed mobility: Needs Assistance Bed Mobility: Supine to Sit     Supine to sit: Supervision     General bed mobility comments: used rail, HOB up, supervision for  safety  Transfers Overall transfer level: Needs assistance Equipment used: Rolling walker (2 wheeled) Transfers: Sit to/from Stand Sit to Stand: Mod assist;From elevated surface;+2 safety/equipment         General transfer comment: assist to power up, +2 safety due to LEs buckling in ED yesterday, VCs hand placement    Balance Overall balance assessment: History of Falls;Needs assistance Sitting-balance support: Feet supported Sitting balance-Leahy Scale: Fair     Standing balance support: Bilateral upper extremity supported Standing balance-Leahy Scale: Poor Standing balance comment: heavy reliance upon BUE support 2* BLEs buckling                           ADL either performed or assessed with clinical judgement   ADL Overall ADL's : Needs assistance/impaired Eating/Feeding: Set up;Sitting   Grooming: Set up;Sitting;Min guard   Upper Body Bathing: Set up;Sitting;Min guard   Lower Body Bathing: Moderate assistance;Sit to/from stand;+2 for safety/equipment   Upper Body Dressing : Set up;Sitting;Min guard   Lower Body Dressing: Moderate assistance;Sit to/from stand;+2 for safety/equipment   Toilet Transfer: Moderate assistance;Ambulation;RW;Comfort height toilet;Grab bars   Toileting- Clothing Manipulation and Hygiene: Moderate assistance;Sit to/from stand;+2 for safety/equipment   Tub/ Shower Transfer: Moderate assistance;+2 for safety/equipment;Ambulation;3 in 1;Rolling walker   Functional mobility during ADLs: Min guard;+2 for safety/equipment;Rolling walker General ADL Comments: Pt completed bed mobility, sat EOB a few minutes then ambulated into the hall where she had significant episode of LOC along with posterior arching of back and neck. Chair brought to pt, nursing notified and immediately came to assess/aid. max +  2 assist to squat pivot back to bed.      Vision         Perception     Praxis      Pertinent Vitals/Pain Pain Assessment:  0-10 Pain Score: 10-Worst pain ever Faces Pain Scale: Hurts even more Pain Location: back Pain Descriptors / Indicators: Sore Pain Intervention(s): Limited activity within patient's tolerance;Monitored during session;Premedicated before session     Hand Dominance Right   Extremity/Trunk Assessment Upper Extremity Assessment Upper Extremity Assessment: Generalized weakness;RUE deficits/detail;LUE deficits/detail RUE Deficits / Details: pt endorses decreased sensation throughout RUE LUE Deficits / Details: Pt reports episode this morning of holding cell in R hand and "all of the sudden it felt so heavy."   Lower Extremity Assessment Lower Extremity Assessment: Defer to PT evaluation RLE Deficits / Details: ankle/knee/hip strength +4/5; but BLEs tremulous and buckling with walking requiring max BUE support on RW RLE Sensation: history of peripheral neuropathy LLE Deficits / Details: ankle/knee/hip strength +4/5; but BLEs tremulous and buckling with walking requiring max BUE support on RW LLE Sensation: history of peripheral neuropathy   Cervical / Trunk Assessment Cervical / Trunk Assessment: Normal   Communication Communication Communication: Expressive difficulties (increased time to get words out, some word finding difficult)   Cognition Arousal/Alertness: Awake/alert Behavior During Therapy: WFL for tasks assessed/performed Overall Cognitive Status: Within Functional Limits for tasks assessed                                 General Comments: initially WNL then episode of posterior arching of back and loss of consciousness while ambulating in the hall with PT/OT.   General Comments       Exercises     Shoulder Instructions      Home Living Family/patient expects to be discharged to:: Private residence Living Arrangements: Alone;Other (Comment) (daughter lives across the hall from patient) Available Help at Discharge: Family Type of Home: Apartment Home  Access: Stairs to enter Technical brewer of Steps: 3 Entrance Stairs-Rails: None Home Layout: One level     Bathroom Shower/Tub: Teacher, early years/pre: Standard     Home Equipment: Radio producer - single point      Lives With: Alone (dtr lives across the apt [address])    Prior Functioning/Environment Level of Independence: Independent        Comments: got a cane when diagnosed with neuropathy but has not really used it.        OT Problem List: Decreased strength;Decreased activity tolerance;Impaired balance (sitting and/or standing);Decreased safety awareness;Decreased knowledge of use of DME or AE;Decreased knowledge of precautions;Cardiopulmonary status limiting activity;Impaired UE functional use;Pain      OT Treatment/Interventions: Self-care/ADL training;Therapeutic exercise;Neuromuscular education;DME and/or AE instruction;Therapeutic activities;Patient/family education;Balance training;Energy conservation    OT Goals(Current goals can be found in the care plan section) Acute Rehab OT Goals Patient Stated Goal: return to independence OT Goal Formulation: With patient Time For Goal Achievement: 12/05/19 Potential to Achieve Goals: Good ADL Goals Pt Will Perform Grooming: with modified independence;standing Pt Will Perform Upper Body Bathing: with modified independence;sitting Pt Will Perform Lower Body Bathing: with modified independence;sit to/from stand Pt Will Transfer to Toilet: with modified independence;ambulating Pt Will Perform Toileting - Clothing Manipulation and hygiene: with modified independence;sit to/from stand  OT Frequency: Min 2X/week   Barriers to D/C:            Co-evaluation PT/OT/SLP Co-Evaluation/Treatment: Yes Reason for Co-Treatment:  For patient/therapist safety;To address functional/ADL transfers   OT goals addressed during session: ADL's and self-care      AM-PAC OT "6 Clicks" Daily Activity     Outcome Measure Help  from another person eating meals?: A Little Help from another person taking care of personal grooming?: A Little Help from another person toileting, which includes using toliet, bedpan, or urinal?: A Lot Help from another person bathing (including washing, rinsing, drying)?: A Lot Help from another person to put on and taking off regular upper body clothing?: A Little Help from another person to put on and taking off regular lower body clothing?: A Lot 6 Click Score: 15   End of Session Equipment Utilized During Treatment: Gait belt;Rolling walker Nurse Communication: Other (comment);Mobility status (LOC and arching of neck/back while walking)  Activity Tolerance: Other (comment) (pt with LOC and posterior arching of back and neck ambulatin) Patient left: in bed;with call bell/phone within reach;with family/visitor present;Other (comment) (nursing staff present and continuing to assess/monitor pt)  OT Visit Diagnosis: Unsteadiness on feet (R26.81);Other abnormalities of gait and mobility (R26.89);Muscle weakness (generalized) (M62.81);Other symptoms and signs involving the nervous system (R29.898);Cognitive communication deficit (R41.841);Pain                Time: 4259-5638 OT Time Calculation (min): 31 min Charges:  OT General Charges $OT Visit: 1 Visit OT Evaluation $OT Eval Moderate Complexity: [place], OT Acute Rehabilitation Services Pager: [phone] Office: [phone]   Hortencia Pilar 11/21/2019, 12:46 PM

## 2019-11-21 NOTE — Plan of Care (Signed)
  Problem: Education: Goal: Knowledge of General Education information will improve Description: Including pain rating scale, medication(s)/side effects and non-pharmacologic comfort measures Outcome: Progressing   Problem: Health Behavior/Discharge Planning: Goal: Ability to manage health-related needs will improve Outcome: Progressing   Problem: Clinical Measurements: Goal: Respiratory complications will improve Outcome: Progressing Goal: Cardiovascular complication will be avoided Outcome: Progressing   Problem: Activity: Goal: Risk for activity intolerance will decrease Outcome: Progressing   Problem: Nutrition: Goal: Adequate nutrition will be maintained Outcome: Progressing   Problem: Coping: Goal: Level of anxiety will decrease Outcome: Progressing   Problem: Elimination: Goal: Will not experience complications related to urinary retention Outcome: Progressing   Problem: Pain Managment: Goal: General experience of comfort will improve Outcome: Progressing   

## 2019-11-21 NOTE — Evaluation (Signed)
Speech Language Pathology Evaluation Patient Details Name: Bethany [name] MRN: [id number] DOB: May 04, 1951 Today's Date: 11/21/2019 Time: 0925-1009 SLP Time Calculation (min) (ACUTE ONLY): 44 min  Problem List:  Patient Active Problem List   Diagnosis Date Noted  . CVA (cerebral vascular accident) ([place]) 11/19/2019  . Acute on chronic diastolic CHF (congestive heart failure) ([place])   . Acute respiratory failure with hypoxia ([place]) 03/27/2019  . Fever 03/27/2019  . Shortness of breath 03/27/2019  . Hypothyroidism 03/27/2019  . Chronic GI bleeding 03/27/2019  . Pleural effusion on right 03/27/2019  . Acute renal failure superimposed on stage 3b chronic kidney disease ([place]) 03/27/2019  . Essential hypertension 03/27/2019  . GERD (gastroesophageal reflux disease) 03/27/2019  . Acute respiratory failure ([place]) 03/27/2019  . Breast mass, right 06/08/2017  . Liver mass 06/08/2017  . Chronic kidney disease (CKD), stage III (moderate) 03/13/2017  . Chronic headaches 03/13/2017  . Depression 03/13/2017  . Diabetes mellitus type 2 with neurological manifestations ([place]) 03/13/2017  . History of thrombosis 03/13/2017  . Hyperlipemia, mixed 03/13/2017  . Community acquired pneumonia 03/13/2017  . COPD exacerbation ([place]) 03/13/2017  . Pneumonia 03/13/2017  . Iron deficiency anemia secondary to blood loss (chronic) 03/07/2017  . GI AVM (gastrointestinal arteriovenous vascular malformation) 03/07/2017  . History of colonic polyps 10/05/2016   Past Medical History:  Past Medical History:  Diagnosis Date  . Acute renal failure ([place])   . Arthritis   . Diabetes mellitus without complication ([place])    type II   . Hypercalcemia   . Hyperlipidemia   . Hypertension   . Renal disorder   . Single kidney   . Thrombosis   . Tobacco abuse    Past Surgical History:  Past Surgical History:  Procedure Laterality Date  . ABDOMINAL HYSTERECTOMY    . blood clots removed from descending aorta     .  CHOLECYSTECTOMY    . COLONOSCOPY    . COLONOSCOPY WITH PROPOFOL N/A 04/17/2017   Procedure: COLONOSCOPY WITH PROPOFOL;  Surgeon: Wilford Corner, MD;  Location: [facility]COPY;  Service: Endoscopy;  Laterality: N/A;  . ESOPHAGOGASTRODUODENOSCOPY (EGD) WITH PROPOFOL N/A 04/17/2017   Procedure: ESOPHAGOGASTRODUODENOSCOPY (EGD) WITH PROPOFOL;  Surgeon: Wilford Corner, MD;  Location: [facility];  Service: Endoscopy;  Laterality: N/A;  . IR IMAGING GUIDED PORT INSERTION  05/01/2019  . NEPHRECTOMY RECIPIENT     HPI:  MARJON DOXTATER is a 69 y.o. female with history of thrombosis, hypertension, hyperlipidemia, hypercalcemia, diabetes and tobacco abuse admitted with sudden headache followed by significant difficulty getting her words out. MRI brain-acute posterior left MCA territory infarct without mass-effect.  Small focus of petechial hemorrhage with mild cytotoxic edema.  Chronic ischemic microangiopathy.   Assessment / Plan / Recommendation Clinical Impression  Pt presents with an expressive-type aphasia, with borderline dysfluency, notable for slowed, deliberate output with anomic errors and occasional phonemic paraphasias with recognition.  Syntax is appropriate with inclusion of all forms of speech.  There are hesitancies and initial sound repetitions as pt searches for correct word.  Repetition is impaired at phrase level.  Comprehension is marked by good yes/no reliability; she follows two and three-step commands; mild difficulty with paragraph comprehension. Pt has a lot of family support.  Will benefit from SLP f/u to address the aforementioned deficits; recommend CIR consult.      SLP Assessment  SLP Recommendation/Assessment: Patient needs continued Speech Lanaguage Pathology Services SLP Visit Diagnosis: Aphasia (R47.01)    Follow Up Recommendations  Inpatient Rehab  Frequency and Duration min 3x week  2 weeks      SLP Evaluation Cognition  Overall Cognitive Status: Within  Functional Limits for tasks assessed       Comprehension  Auditory Comprehension Yes/No Questions: Within Functional Limits Commands: Within Functional Limits Conversation: Simple Visual Recognition/Discrimination Discrimination: Within Function Limits Reading Comprehension Reading Status: Impaired Paragraph Level: Impaired    Expression Verbal Expression Overall Verbal Expression: Impaired Initiation: No impairment Automatic Speech: Day of week Repetition: Impaired Level of Impairment: Phrase level Naming: Impairment Responsive: 76-100% accurate Confrontation: Impaired Convergent: 75-100% accurate Divergent: 25-49% accurate Verbal Errors: Phonemic paraphasias;Aware of errors Pragmatics: No impairment Written Expression Dominant Hand: Right Written Expression: Not tested   Oral / Motor  Oral Motor/Sensory Function Overall Oral Motor/Sensory Function: Within functional limits Motor Speech Overall Motor Speech: Appears within functional limits for tasks assessed   GO                    Juan Quam Laurice 11/21/2019, 10:18 AM  Estill Bamberg L. Tivis Ringer, [place] Office number [phone] Pager [phone]

## 2019-11-21 NOTE — Evaluation (Addendum)
Physical Therapy Evaluation Patient Details Name: Bethany [name] MRN: [id number] DOB: 15-May-1951 Today's Date: 11/21/2019   History of Present Illness  69 y.o. female with history of thrombosis, hypertension, hyperlipidemia, hypercalcemia, diabetes admitted with headache and difficulty with speech, onset was 11/16/19. Imaging showed acute posterior L MCA territory infarct.  Clinical Impression  Pt admitted with above diagnosis. Mod assist for sit to stand. In standing with RW pt had some buckling of BLEs, she was able to maintain standing with heavy reliance upon UE support on RW. Pt ambulated 67' with RW and then became unresponsive, with head tilting back posteriorly. Assisted pt to recliner, then to bed where she became responsive. BP supine 202/78, HR 78, SaO2 100% on room air.  Pt's sister reports pt "passed out" at a funeral 3 weeks ago, pt reports BLEs buckled in ED yesterday, otherwise she denies prior falls in the past year. Pt is independent with mobility and ADLs at baseline.  Pt currently with functional limitations due to the deficits listed below (see PT Problem List). Pt will benefit from skilled PT to increase their independence and safety with mobility to allow discharge to the venue listed below.       Follow Up Recommendations CIR;Supervision for mobility/OOB;Supervision/Assistance - 24 hour    Equipment Recommendations  Rolling walker with 5" wheels    Recommendations for Other Services       Precautions / Restrictions Precautions Precautions: Fall Precaution Comments: pt reports LEs buckled in ED, pt became unresponsive while walking today; pt denies h/o prior falls, pt's sister stated pt passed out at a funeral 3 weeks ago Restrictions Weight Bearing Restrictions: No      Mobility  Bed Mobility Overal bed mobility: Needs Assistance Bed Mobility: Supine to Sit     Supine to sit: Supervision     General bed mobility comments: used rail, HOB up, supervision for  safety  Transfers Overall transfer level: Needs assistance Equipment used: Rolling walker (2 wheeled) Transfers: Sit to/from Stand Sit to Stand: Mod assist;From elevated surface;+2 safety/equipment         General transfer comment: assist to power up, +2 safety due to LEs buckling in ED yesterday, VCs hand placement  Ambulation/Gait Ambulation/Gait assistance: +2 safety/equipment;Min guard Gait Distance (Feet): 50 Feet Assistive device: Rolling walker (2 wheeled) Gait Pattern/deviations: Step-through pattern;Decreased step length - right;Decreased step length - left Gait velocity: decr   General Gait Details: BLEs tremulous and buckling, pt relied heavily on BUE support with RW, after 50' pt became unresponsive, had posterior lean with head a trunk and had to be assisted to a chair then to bed, RN called to room, BP supine 202/78, SaO2 100%, HR 70s, pt became responsive once in bed  Stairs            Wheelchair Mobility    Modified Rankin (Stroke Patients Only)       Balance Overall balance assessment: History of Falls;Needs assistance Sitting-balance support: Feet supported Sitting balance-Leahy Scale: Fair     Standing balance support: Bilateral upper extremity supported Standing balance-Leahy Scale: Poor Standing balance comment: heavy reliance upon BUE support 2* BLEs buckling                             Pertinent Vitals/Pain Pain Assessment: 0-10 Pain Score: 10-Worst pain ever Faces Pain Scale: Hurts even more Pain Location: back Pain Descriptors / Indicators: Sore Pain Intervention(s): Limited activity within patient's tolerance;Monitored during session;Premedicated  before session    Home Living Family/patient expects to be discharged to:: Private residence Living Arrangements: Alone;Other (Comment) (daughter lives across the hall from patient) Available Help at Discharge: Family Type of Home: Apartment Home Access: Stairs to  enter Entrance Stairs-Rails: None Entrance Stairs-Number of Steps: 3 Home Layout: One level Home Equipment: Cane - single point      Prior Function Level of Independence: Independent         Comments: got a cane when diagnosed with neuropathy but has not really used it.     Hand Dominance   Dominant Hand: Right    Extremity/Trunk Assessment   Upper Extremity Assessment Upper Extremity Assessment: Defer to OT evaluation    Lower Extremity Assessment Lower Extremity Assessment: RLE deficits/detail;LLE deficits/detail RLE Deficits / Details: ankle/knee/hip strength +4/5; but BLEs tremulous and buckling with walking requiring max BUE support on RW RLE Sensation: history of peripheral neuropathy LLE Deficits / Details: ankle/knee/hip strength +4/5; but BLEs tremulous and buckling with walking requiring max BUE support on RW LLE Sensation: history of peripheral neuropathy    Cervical / Trunk Assessment Cervical / Trunk Assessment: Normal  Communication   Communication: Expressive difficulties (increased time to get words out, some word finding difficulty)  Cognition Arousal/Alertness: Awake/alert Behavior During Therapy: WFL for tasks assessed/performed Overall Cognitive Status: Within Functional Limits for tasks assessed                                        General Comments      Exercises     Assessment/Plan    PT Assessment Patient needs continued PT services  PT Problem List Decreased strength;Decreased activity tolerance;Decreased balance;Decreased mobility;Pain;Decreased knowledge of use of DME       PT Treatment Interventions Therapeutic activities;Therapeutic exercise;Gait training;Functional mobility training;Balance training;Stair training;Patient/family education    PT Goals (Current goals can be found in the Care Plan section)  Acute Rehab PT Goals Patient Stated Goal: return to independence PT Goal Formulation: With patient Time  For Goal Achievement: 12/05/19 Potential to Achieve Goals: Good    Frequency Min 4X/week   Barriers to discharge        Co-evaluation               AM-PAC PT "6 Clicks" Mobility  Outcome Measure Help needed turning from your back to your side while in a flat bed without using bedrails?: A Little Help needed moving from lying on your back to sitting on the side of a flat bed without using bedrails?: A Little Help needed moving to and from a bed to a chair (including a wheelchair)?: A Lot Help needed standing up from a chair using your arms (e.g., wheelchair or bedside chair)?: A Lot Help needed to walk in hospital room?: A Lot Help needed climbing 3-5 steps with a railing? : Total 6 Click Score: 13    End of Session Equipment Utilized During Treatment: Gait belt Activity Tolerance: Treatment limited secondary to medical complications (Comment) (pt became unresponsive) Patient left: in bed;with nursing/sitter in room;with call bell/phone within reach Nurse Communication: Mobility status PT Visit Diagnosis: Difficulty in walking, not elsewhere classified (R26.2)    Time: 1610-9604 PT Time Calculation (min) (ACUTE ONLY): 31 min   Charges:   PT Evaluation $PT Eval Moderate Complexity: 1 Mod         Blondell Reveal Kistler PT 11/21/2019  Acute Rehabilitation Services  Pager [phone] Office [phone]

## 2019-11-21 NOTE — Progress Notes (Signed)
STROKE TEAM PROGRESS NOTE   INTERVAL HISTORY I personally reviewed history of presenting illness with the patient, electronic medical records and imaging films in PACS.  She presented with expressive speech and language difficulties and MRI scan shows left temporal MCA branch infarct with MRA showing distal left M2 /M3 stenosis.Marland Kitchen  MRI also shows 5 to 6 mm left MCA bifurcation and.  LDL cholesterol 91 mg percent.  Hemoglobin A1c is 5.7.  Echocardiogram shows normal ejection fraction without cardiac source of embolism.  Vitals:   11/21/19 0734 11/21/19 1051 11/21/19 1058 11/21/19 1205  BP: (!) 150/68 (!) 197/62 (!) 174/69 (!) 182/62  Pulse: 60 69 63 63  Resp: 18 17  16   Temp: 98.5 F (36.9 C) 98.5 F (36.9 C)  98.5 F (36.9 C)  TempSrc: Oral Oral  Oral  SpO2: 100% 100%  99%  Weight:      Height:       CBC:  Recent Labs  Lab 11/19/19 1317 11/20/19 0433  WBC 8.8 7.0  NEUTROABS 6.0 4.9  HGB 10.1* 9.9*  HCT 34.9* 33.4*  MCV 81.7 82.5  PLT 238 932   Basic Metabolic Panel:  Recent Labs  Lab 11/19/19 1317 11/20/19 0433  NA 137 139  K 4.2 4.2  CL 100 103  CO2 26 26  GLUCOSE 184* 131*  BUN 20 16  CREATININE 1.56* 1.01*  1.04*  CALCIUM 9.9 9.9  MG  --  1.8   Lipid Panel:  Recent Labs  Lab 11/20/19 0400  CHOL 179  TRIG 267*  HDL 35*  CHOLHDL 5.1  VLDL 53*  LDLCALC 91   HgbA1c:  Recent Labs  Lab 11/20/19 0433  HGBA1C 5.7*   Urine Drug Screen:  Recent Labs  Lab 11/19/19 2030  LABOPIA POSITIVE*  COCAINSCRNUR NONE DETECTED  LABBENZ NONE DETECTED  AMPHETMU NONE DETECTED  THCU NONE DETECTED  LABBARB NONE DETECTED    Alcohol Level  Recent Labs  Lab 11/19/19 1317  ETH <10    IMAGING past 24 hours MR ANGIO HEAD WO CONTRAST  Result Date: 11/20/2019 CLINICAL DATA:  Stroke, follow-up. EXAM: MRA HEAD WITHOUT CONTRAST TECHNIQUE: Angiographic images of the Circle of Willis were obtained using MRA technique without intravenous contrast. COMPARISON:  Brain MRI  performed one day prior 11/19/2019. FINDINGS: The intracranial internal carotid arteries are patent without significant stenosis. The M1 middle cerebral arteries are patent without significant stenosis. No M2 proximal branch occlusion is identified. There is a high-grade stenosis within a distal M2/proximal M3 left MCA branch vessel in the region of a known acute/early subacute infarct. The anterior cerebral arteries are patent without significant stenosis. There is a 5-6 mm superiorly projecting aneurysm arising from the left MCA bifurcation (series 8, image 81). 1-2 mm inferiorly projecting vascular protrusion arising from the supraclinoid left internal carotid artery which may reflect a tiny aneurysm or infundibulum (series 8, image 92). The intracranial vertebral arteries are patent without significant stenosis, as is the basilar artery. The posterior cerebral arteries are patent bilaterally without significant proximal stenosis. There are apparent moderate/severe stenoses within P3 posterior cerebral artery branches bilaterally (series 102, image 15) (series 102, image four). Right mastoid effusion. IMPRESSION: No intracranial large vessel occlusion. There is a high-grade stenosis within a distal M2/proximal M3 left MCA branch vessel in the region of a known acute/early subacute infarct affecting the left temporoparietal lobes. Apparent moderate/severe stenoses within P3 posterior cerebral artery branches bilaterally. 5-6 MM SUPERIORLY PROJECTING ANEURYSM ARISING FROM THE LEFT MCA BIFURCATION.  NEUROSURGICAL/NEUROINTERVENTIONAL CONSULTATION IS RECOMMENDED. 1-2 mm inferiorly projecting vascular protrusion arising from the supraclinoid left ICA, which may reflect a tiny aneurysm or infundibulum. Right mastoid effusion. Electronically Signed   By: Kellie Simmering DO   On: 11/20/2019 17:46   ECHOCARDIOGRAM COMPLETE  Result Date: 11/20/2019    ECHOCARDIOGRAM REPORT   Patient Name:   Bethany [name] Date of Exam:  11/20/2019 Medical Rec #:  [id number]     Height:       65.0 in Accession #:    [id number]    Weight:       178.7 lb Date of Birth:  [birth year]     BSA:          1.886 m Patient Age:    69 years      BP:           159/54 mmHg Patient Gender: F             HR:           66 bpm. Exam Location:  Inpatient Procedure: 2D Echo Indications:    Stroke  History:        Patient has prior history of Echocardiogram examinations, most                 recent 03/28/2019. Arrythmias:PAC; Risk Factors:Hypertension,                 Diabetes, Dyslipidemia and Current Smoker.  Sonographer:    Mikki Santee RDCS (AE) Referring Phys: [place]  1. Left ventricular ejection fraction, by estimation, is 60 to 65%. The left ventricle has normal function. The left ventricle has no regional wall motion abnormalities. There is mild left ventricular hypertrophy. Left ventricular diastolic parameters are consistent with Grade II diastolic dysfunction (pseudonormalization). Elevated left ventricular end-diastolic pressure.  2. Right ventricular systolic function is normal. The right ventricular size is normal.  3. The mitral valve is normal in structure. Trivial mitral valve regurgitation. No evidence of mitral stenosis.  4. The aortic valve is tricuspid. Aortic valve regurgitation is not visualized. Mild aortic valve sclerosis is present, with no evidence of aortic valve stenosis.  5. The inferior vena cava is normal in size with greater than 50% respiratory variability, suggesting right atrial pressure of 3 mmHg. FINDINGS  Left Ventricle: Left ventricular ejection fraction, by estimation, is 60 to 65%. The left ventricle has normal function. The left ventricle has no regional wall motion abnormalities. The left ventricular internal cavity size was normal in size. There is  mild left ventricular hypertrophy. Left ventricular diastolic parameters are consistent with Grade II diastolic dysfunction (pseudonormalization).  Elevated left ventricular end-diastolic pressure. Right Ventricle: The right ventricular size is normal. No increase in right ventricular wall thickness. Right ventricular systolic function is normal. Left Atrium: Left atrial size was normal in size. Right Atrium: Right atrial size was normal in size. Pericardium: There is no evidence of pericardial effusion. Mitral Valve: The mitral valve is normal in structure. Normal mobility of the mitral valve leaflets. Trivial mitral valve regurgitation. No evidence of mitral valve stenosis. Tricuspid Valve: The tricuspid valve is normal in structure. Tricuspid valve regurgitation is not demonstrated. No evidence of tricuspid stenosis. Aortic Valve: The aortic valve is tricuspid. Aortic valve regurgitation is not visualized. Mild aortic valve sclerosis is present, with no evidence of aortic valve stenosis. Pulmonic Valve: The pulmonic valve was normal in structure. Pulmonic valve regurgitation is not visualized. No evidence of pulmonic stenosis. Aorta: The  aortic root is normal in size and structure. Venous: The inferior vena cava is normal in size with greater than 50% respiratory variability, suggesting right atrial pressure of 3 mmHg. IAS/Shunts: No atrial level shunt detected by color flow Doppler.  LEFT VENTRICLE PLAX 2D LVIDd:         4.90 cm  Diastology LVIDs:         2.60 cm  LV e' lateral:   6.20 cm/s LV PW:         1.20 cm  LV E/e' lateral: 19.7 LV IVS:        1.20 cm  LV e' medial:    5.66 cm/s LVOT diam:     2.00 cm  LV E/e' medial:  21.6 LV SV:         110 LV SV Index:   58 LVOT Area:     3.14 cm  RIGHT VENTRICLE RV S prime:     11.00 cm/s TAPSE (M-mode): 2.2 cm LEFT ATRIUM             Index       RIGHT ATRIUM           Index LA diam:        3.40 cm 1.80 cm/m  RA Area:     12.00 cm LA Vol (A2C):   46.6 ml 24.71 ml/m RA Volume:   22.40 ml  11.88 ml/m LA Vol (A4C):   96.6 ml 51.23 ml/m LA Biplane Vol: 71.1 ml 37.70 ml/m  AORTIC VALVE LVOT Vmax:   161.00 cm/s  LVOT Vmean:  96.700 cm/s LVOT VTI:    0.350 m  AORTA Ao Root diam: 3.10 cm MITRAL VALVE MV Area (PHT): 2.95 cm     SHUNTS MV Decel Time: 257 msec     Systemic VTI:  0.35 m MV E velocity: 122.00 cm/s  Systemic Diam: 2.00 cm MV A velocity: 96.60 cm/s MV E/A ratio:  1.26 Jenkins Rouge MD Electronically signed by Jenkins Rouge MD Signature Date/Time: 11/20/2019/1:41:45 PM    Final     PHYSICAL EXAM Pleasant obese middle-aged African-American lady not in distress. . Afebrile. Head is nontraumatic. Neck is supple without bruit.    Cardiac exam no murmur or gallop. Lungs are clear to auscultation. Distal pulses are well felt. Neurological Exam ;  Awake  Alert oriented x 3.  Mild expressive aphasia with hesitant nonfluent speech and l word finding difficulties.  Good comprehension naming and repetition.Marland [place] movements full without nystagmus.fundi were not visualized. Vision acuity and fields appear normal. Hearing is normal. Palatal movements are normal. Face symmetric. Tongue midline. Normal strength, tone, reflexes and coordination. Normal sensation. Gait deferred.  ASSESSMENT/PLAN Bethany [name] is a 69 y.o. female with history of  thrombosis, hypertension, hyperlipidemia, hypercalcemia, diabetes presenting with sudden onset HA and getting her words out on  7/3 (5 days prior). MRI confirmed infarct.   Stroke:   L MCA infarct w/ petechial hemorrhage, embolic secondary to unclear source.  Left MCA bifurcation 5 to 6 mm with aneurysm likely incidental and asymptomatic  CT head likely subacute L temporal lobe infarct   MRI  Acute posterior L MCA infarct w/ small petechial hemorrhage w/ mild edema. Small vessel disease. R mastoid effusion  MRA  No LVO. L M2/M3 high-grade stenosis.  B P3 moderate / severe stenosis. 5-41mm L MCA bifurcation aneurysm. 1-55mm supraclinoid L ICA aneurysm vs infundibulum  Carotid Doppler  B ICA 1-39% stenosis, VAs antegrade   2D Echo EF  60-56%. No source of embolus   Considered  loop placement to look for AF, however, in setting of anemia and 5-59mm aneurysm, pt not a good long-term AC candidate. Will not place loop.  LDL 91  HgbA1c 5.7  VTE prophylaxis - lovenox 1 dose given then stopped. Added SCDs   No antithrombotic prior to admission, now on aspirin 325 mg daily. Will decrease aspirin to 81 and add plavix. Continue DAPT x 3 MONTHS given intracranial atherosclerosism then as aspirin alone.  Therapy recommendations:  CIR  Disposition:  pending   Hypertension  Stable . Permissive hypertension (OK if < 220/120) but gradually normalize in 5-7 days . Long-term BP goal normotensive  Hyperlipidemia  Home meds:  lipitor 40 + zetia 10  Now on lipitor 80 + zetia 10  LDL 91, goal < 70  Continue statin at discharge  Other Stroke Risk Factors  Advanced age  Cigarette smoker, advised to stop smoking  ETOH use, alcohol level <10, advised to drink no more than 1 drink(s) a day  Obesity, Body mass index is 30.82 kg/m., recommend weight loss, diet and exercise as appropriate   Other Active Problems  Chronic anemia Hgb 9.9  Single kidney, Cre 1.56->1.01  Hospital day # 2 Patient presented with aphasia secondary to left MCA branch infarct.  Etiology unclear but probably related to intracranial atherosclerosis.  Patient has chronic anemia and gets periodic iron infusion and may not be the best candidate for long-term anticoagulation hence will not consider placing in the loop recorder for paroxysmal A. fib.  She also has a 5 to 6 mm left MCA bifurcation aneurysm which is likely asymptomatic and can be managed conservatively for now.  We will address this on outpatient office visits and consider referral to endovascular interventional radiology/neurosurgery later.  Recommend aspirin and Plavix for 3 months followed by aspirin alone and aggressive risk factor modification.  Discussed with patient and Dr. Loleta Books.  Greater than 50% time during this 35-minute visit  was spent in counseling and coordination of care about her stroke, intracranial stenosis and left MCA bifurcation aneurysm and answering questions.  Follow-up as an outpatient stroke clinic in 6 weeks Antony Contras, MD To contact Stroke Continuity provider, please refer to [email or web address] After hours, contact General Neurology

## 2019-11-21 NOTE — Progress Notes (Signed)
Carotid artery duplex has been completed. Preliminary results can be found in CV Proc through chart review.   11/21/19 1:34 PM Bethany [name] RVT

## 2019-11-22 ENCOUNTER — Inpatient Hospital Stay: Payer: Medicare Other

## 2019-11-22 ENCOUNTER — Inpatient Hospital Stay: Payer: Medicare Other | Attending: Hematology

## 2019-11-22 DIAGNOSIS — E1122 Type 2 diabetes mellitus with diabetic chronic kidney disease: Secondary | ICD-10-CM | POA: Insufficient documentation

## 2019-11-22 DIAGNOSIS — I129 Hypertensive chronic kidney disease with stage 1 through stage 4 chronic kidney disease, or unspecified chronic kidney disease: Secondary | ICD-10-CM | POA: Insufficient documentation

## 2019-11-22 DIAGNOSIS — N183 Chronic kidney disease, stage 3 unspecified: Secondary | ICD-10-CM | POA: Insufficient documentation

## 2019-11-22 DIAGNOSIS — E1142 Type 2 diabetes mellitus with diabetic polyneuropathy: Secondary | ICD-10-CM | POA: Insufficient documentation

## 2019-11-22 DIAGNOSIS — J449 Chronic obstructive pulmonary disease, unspecified: Secondary | ICD-10-CM | POA: Insufficient documentation

## 2019-11-22 DIAGNOSIS — D500 Iron deficiency anemia secondary to blood loss (chronic): Secondary | ICD-10-CM | POA: Insufficient documentation

## 2019-11-22 DIAGNOSIS — Q2733 Arteriovenous malformation of digestive system vessel: Secondary | ICD-10-CM | POA: Insufficient documentation

## 2019-11-22 LAB — CBC WITH DIFFERENTIAL/PLATELET
Abs Immature Granulocytes: 0.02 10*3/uL (ref 0.00–0.07)
Basophils Absolute: 0 10*3/uL (ref 0.0–0.1)
Basophils Relative: 0 %
Eosinophils Absolute: 0.2 10*3/uL (ref 0.0–0.5)
Eosinophils Relative: 4 %
HCT: 34.8 % — ABNORMAL LOW (ref 36.0–46.0)
Hemoglobin: 10.1 g/dL — ABNORMAL LOW (ref 12.0–15.0)
Immature Granulocytes: 0 %
Lymphocytes Relative: 19 %
Lymphs Abs: 1 10*3/uL (ref 0.7–4.0)
MCH: 23.3 pg — ABNORMAL LOW (ref 26.0–34.0)
MCHC: 29 g/dL — ABNORMAL LOW (ref 30.0–36.0)
MCV: 80.2 fL (ref 80.0–100.0)
Monocytes Absolute: 0.3 10*3/uL (ref 0.1–1.0)
Monocytes Relative: 6 %
Neutro Abs: 3.9 10*3/uL (ref 1.7–7.7)
Neutrophils Relative %: 71 %
Platelets: 196 10*3/uL (ref 150–400)
RBC: 4.34 MIL/uL (ref 3.87–5.11)
RDW: 16.2 % — ABNORMAL HIGH (ref 11.5–15.5)
WBC: 5.5 10*3/uL (ref 4.0–10.5)
nRBC: 0 % (ref 0.0–0.2)

## 2019-11-22 LAB — BASIC METABOLIC PANEL
Anion gap: 8 (ref 5–15)
BUN: 12 mg/dL (ref 8–23)
CO2: 25 mmol/L (ref 22–32)
Calcium: 9.8 mg/dL (ref 8.9–10.3)
Chloride: 103 mmol/L (ref 98–111)
Creatinine, Ser: 1.03 mg/dL — ABNORMAL HIGH (ref 0.44–1.00)
GFR calc Af Amer: >60 mL/min (ref >60)
GFR calc non Af Amer: 55 mL/min — ABNORMAL LOW (ref >60)
Glucose, Bld: 169 mg/dL — ABNORMAL HIGH (ref 70–99)
Potassium: 4.2 mmol/L (ref 3.5–5.1)
Sodium: 136 mmol/L (ref 135–145)

## 2019-11-22 LAB — GLUCOSE, CAPILLARY
Glucose-Capillary: 155 mg/dL — ABNORMAL HIGH (ref 70–99)
Glucose-Capillary: 184 mg/dL — ABNORMAL HIGH (ref 70–99)

## 2019-11-22 LAB — MAGNESIUM: Magnesium: 1.8 mg/dL (ref 1.7–2.4)

## 2019-11-22 MED ORDER — LOSARTAN POTASSIUM 25 MG PO TABS
25.0000 mg | ORAL_TABLET | Freq: Every day | ORAL | 11 refills | Status: AC
Start: 2019-11-22 — End: 2023-03-01

## 2019-11-22 MED ORDER — SODIUM CHLORIDE 0.9 % IV SOLN
510.0000 mg | Freq: Once | INTRAVENOUS | Status: AC
  Administered 2019-11-22: 510 mg via INTRAVENOUS
  Filled 2019-11-22: qty 17

## 2019-11-22 MED ORDER — CLOPIDOGREL BISULFATE 75 MG PO TABS: 75.0000 mg | ORAL_TABLET | Freq: Every day | ORAL | 2 refills | Status: DC

## 2019-11-22 MED ORDER — ASPIRIN 81 MG PO TBEC: 81.0000 mg | DELAYED_RELEASE_TABLET | Freq: Every day | ORAL | 11 refills | Status: DC

## 2019-11-22 MED ORDER — ATORVASTATIN CALCIUM 80 MG PO TABS: 80.0000 mg | ORAL_TABLET | Freq: Every day | ORAL | 11 refills | Status: DC

## 2019-11-22 MED ORDER — HEPARIN SOD (PORK) LOCK FLUSH 100 UNIT/ML IV SOLN
500.0000 [IU] | INTRAVENOUS | Status: AC | PRN
  Administered 2019-11-22: 500 [IU]
  Filled 2019-11-22: qty 5

## 2019-11-22 NOTE — Plan of Care (Signed)
  Problem: Education: Goal: Knowledge of General Education information will improve Description: Including pain rating scale, medication(s)/side effects and non-pharmacologic comfort measures Outcome: Adequate for Discharge   Problem: Health Behavior/Discharge Planning: Goal: Ability to manage health-related needs will improve Outcome: Adequate for Discharge  Reviewed d/c plan for outpatient rehab and aspirin/plavix extensively.  Problem: Activity: Goal: Risk for activity intolerance will decrease Outcome: Adequate for Discharge  Ambulates to bathroom, denies SOB.  Problem: Nutrition: Goal: Adequate nutrition will be maintained Outcome: Adequate for Discharge Feeds self.  Problem: Coping: Goal: Level of anxiety will decrease Outcome: Adequate for Discharge Patient verbalizes strategies that help her to alleviate anxiety when trying to communicate.  Problem: Pain Managment: Goal: General experience of comfort will improve Outcome: Adequate for Discharge  Patient states pain controlled with scheduled and PRN meds.

## 2019-11-22 NOTE — Discharge Summary (Signed)
Physician Discharge Summary  Bethany [name] OHY:[id number] DOB: 1950/11/02 DOA: 11/19/2019  PCP: Wenda Low, MD  Admit date: 11/19/2019 Discharge date: 11/22/2019  Admitted From: Home  Disposition:  Home   Recommendations for Outpatient Follow-up:  1. Follow up with PCP Dr. Lysle Rubens in 1 week 2. Follow up with Dr. Burr Medico, Hematology  3. Dr. Burr Medico or Dr. Lysle Rubens: Please obtain weekly CBC to monitor Hgb on new aspirin/Plavix for next couple weeks 4. Dr. Lysle Rubens: Please check BMP on new losartan in 1 week and adjust BP meds as needed to achieve BP target post-stroke 5. Follow up with Neurology, Dr Leonie Man in 6 weeks 6. Dr. Leonie Man will arrange Neurointerventional follow up in 6 weeks for incidentally noted aneurysm        Home Health: None  Equipment/Devices: None  Discharge Condition: Fair  CODE STATUS: FULL Diet recommendation: Diabetic, cardiac  Brief/Interim Summary: Bethany [name] is a 69 y.o. F with chronic pain on Butrans, long-acting oxycodone, history renal vein thrombosis and nephrectomy, complicated by recurrent bleeding AVM, recurrent trsnfusion and chronic iron deficiency anemia, as well as DM, and HTN who presented with acute aphasia.  Patient was at home a few days prior to admission when she started to notice word finding difficulties and paresthesias on the right side of her body.  Eventually family recommended she come to the ER.  In the ER, CT showed findings suggestive of ischemic stroke.  MRI brain confirmed acute posterior left MCA territory infarct.     PRINCIPAL HOSPITAL DIAGNOSIS: Stroke    Discharge Diagnoses:   Acute ischemic LEFT MCA stroke Patient admitted, MRI showed posterior temporal lobe left MCA territory stroke.  Outside the stroke window.  Dysphagia screen ordered in the ER.  Secondary causes of stroke work-up: Echocardiogram showed no cardiogenic source of embolism, carotid imaging unremarkable.  No atrial fibrillation noted on telemetry.  Given  patient's ongoing chronic blood loss anemia, she is not a candidate for anticoagulation so further cardiac monitoring was deferred.  Stroke risk reduction: Hemoglobin A1c well controlled.  Atorvastatin increased to 80 and Zetia continued to achieve goal LDL less than 70.  Blood pressure elevated, so losartan was added to metoprolol and amlodipine. Neurology recommended aspirin and Plavix for 3 months, followed by aspirin alone indefinitely.  Smoking cessation recommended.     Left MCA bifurcation aneurysm This was an incidental finding  Agree with Dr. Leonie Man, will treat conservatively given history of GI bleeding and chronic blood loss, Dr. Leonie Man will arrange follow up with neuro interventionalist as an outpatient  Chronic iron deficiency anemia due to chronic blood loss Hemoglobin stable relative to baseline, given new aspirin and Plavix, we will treat with here Shirlean Kelly (this was discussed with Dr. Burr Medico by phone).  She has close follow-up for serial hemoglobin checks.    Diabetes, type II, with polyneuropathy Continue Trulicity, Lantus  Hypertension Continue amlodipine, metoprolol.  Add losartan.  Gout  Hypothyroidism  Chronic pain due to arthritis [name] OME is >100 indicating higher risk for accidental death due to opiate overdose.             Discharge Instructions  Discharge Instructions    Ambulatory referral to Neurology   Complete by: As directed    An appointment is requested in approximately: 4 weeks   Diet - low sodium heart healthy   Complete by: As directed    Discharge instructions   Complete by: As directed    From Dr. Loleta Books: You were admitted for a  stroke We suspect that the cause of the stroke was a bit of debris from the heart followed up to the brain.  This is called "embolism".  To prevent another stroke, we recommend you take aspirin and Plavix Take aspirin 81 mg daily Take clopidogrel/Plavix 75 mg once daily for 3  months  For the next 3 weeks, have your blood level checked weekly to watch for bleeding Try to keep both aspirin and Plavix going for AT LEAST the first 3 weeks, preferably 3 months to prevent stroke  For your cholesterol (these are important for stroke prevention): Continue Zetia INCREASE atorvastatin/Lipitor to 80 mg nightly   For your blood pressure, continue amlodipine and metoprolol Add losartan 25 mg daily Go see Dr. Deforest Hoyles in 1 week, have him check your blood pressure and adjust as needed Also have them check your labs to check kidney function IN 1 WEEK    For your anemia: Call Dr. Burr Medico to reschedule your iron infusion Have her or Dr. Lysle Rubens check your blood level weekly for a few weeks    Follow-up with Dr. Leonie Man from [facility] neurology (the stroke specialist) in 6 weeks He will refer you to the specialist for your aneurysm   Increase activity slowly   Complete by: As directed      Allergies as of 11/22/2019      Reactions   Contrast Media [iodinated Diagnostic Agents]    Renal hypertension per physician      Medication List    TAKE these medications   allopurinol 300 MG tablet Commonly known as: ZYLOPRIM Take 1 tablet (300 mg total) by mouth daily.   amLODipine 10 MG tablet Commonly known as: NORVASC Take 10 mg by mouth daily.   APPLE CIDER VINEGAR PO Take 1 tablet by mouth daily.   aspirin 81 MG EC tablet Take 1 tablet (81 mg total) by mouth daily. Swallow whole.   atorvastatin 80 MG tablet Commonly known as: LIPITOR Take 1 tablet (80 mg total) by mouth daily. What changed:   medication strength  how much to take   baclofen 10 MG tablet Commonly known as: LIORESAL Take 10 mg by mouth 3 (three) times daily as needed for muscle spasms.   buprenorphine 20 MCG/HR Ptwk Commonly known as: BUTRANS Place 1 patch onto the skin once a week.   clopidogrel 75 MG tablet Commonly known as: PLAVIX Take 1 tablet (75 mg total) by mouth daily.    ezetimibe 10 MG tablet Commonly known as: ZETIA Take 10 mg by mouth daily.   feeding supplement (ENSURE ENLIVE) Liqd Take 237 mLs by mouth 2 (two) times daily between meals.   furosemide 40 MG tablet Commonly known as: Lasix Take 1 tablet (40 mg total) by mouth daily as needed.   gabapentin 600 MG tablet Commonly known as: NEURONTIN Take 600 mg by mouth 4 (four) times daily.   HumaLOG KwikPen 100 UNIT/ML KiwkPen Generic drug: insulin lispro Inject 24 Units into the skin daily with supper.   Ipratropium-Albuterol 20-100 MCG/ACT Aers respimat Commonly known as: COMBIVENT Inhale 1 puff into the lungs every 6 (six) hours as needed for wheezing or shortness of breath.   levothyroxine 75 MCG tablet Commonly known as: SYNTHROID Take 75 mcg by mouth daily.   lidocaine-prilocaine cream Commonly known as: EMLA Apply 1 application topically as needed.   losartan 25 MG tablet Commonly known as: Cozaar Take 1 tablet (25 mg total) by mouth daily.   metoprolol tartrate 50 MG tablet Commonly known  as: LOPRESSOR Take 50 mg by mouth 2 (two) times daily.   oxyCODONE-acetaminophen 10-325 MG tablet Commonly known as: PERCOCET Take 1 tablet by mouth every 6 (six) hours as needed for pain.   pantoprazole 40 MG tablet Commonly known as: PROTONIX Take 40 mg by mouth daily.   pregabalin 25 MG capsule Commonly known as: LYRICA Take 25 mg by mouth 2 (two) times daily.   senna 8.6 MG Tabs tablet Commonly known as: SENOKOT Take 1 tablet by mouth.   Toujeo SoloStar 300 UNIT/ML Sopn Generic drug: Insulin Glargine Inject 34 Units into the skin at bedtime.   Trulicity 1.5 DP/8.2UM Sopn Generic drug: Dulaglutide Inject 1.5 mg into the skin once a week. Mondays   Vitamin D (Ergocalciferol) 1.25 MG (50000 UNIT) Caps capsule Commonly known as: DRISDOL Take 50,000 Units by mouth every 7 (seven) days.   Xtampza ER 27 MG C12a Generic drug: oxyCODONE ER Take 1 capsule by mouth 2 (two)  times daily.       Follow-up Information    Wenda Low, MD. Schedule an appointment as soon as possible for a visit in 1 week(s).   Specialty: Internal Medicine Contact information: [address] [place] [address] [phone]        Garvin Fila, MD. Schedule an appointment as soon as possible for a visit in 1 month(s).   Specialties: Neurology, Radiology Contact information: [address] [place] [address] [phone]        Truitt Merle, MD. Schedule an appointment as soon as possible for a visit in 1 week(s).   Specialties: Hematology, Oncology Contact information: [address] [place] [address] [phone]              Allergies  Allergen Reactions  . Contrast Media [Iodinated Diagnostic Agents]     Renal hypertension per physician    Consultations:  Neurolgoy   Procedures/Studies: CT Head Wo Contrast  Result Date: 11/19/2019 CLINICAL DATA:  Headache, aphasia, left-sided weakness EXAM: CT HEAD WITHOUT CONTRAST TECHNIQUE: Contiguous axial images were obtained from the base of the skull through the vertex without intravenous contrast. COMPARISON:  None. FINDINGS: Brain: There is hypoattenuation in the posterior left temporal region with loss of gray-white differentiation. There is mild regional mass effect. No acute intracranial hemorrhage. Ventricles and sulci are normal in size and configuration. There is no extra-axial fluid collection. Vascular: No hyperdense vessel. There is mild intracranial atherosclerotic calcification at the skull base. Skull: Unremarkable. Sinuses/Orbits: Aerated.  No significant orbital abnormality. Other: Inferior right mastoid opacification. IMPRESSION: Edema centered within the posterior left temporal region likely reflecting acute to subacute infarction. MRI is recommended for further evaluation. No acute intracranial hemorrhage or significant mass effect. Electronically  Signed   By: Macy Mis M.D.   On: 11/19/2019 13:29   CT CHEST WO CONTRAST  Result Date: 10/29/2019 CLINICAL DATA:  Lung nodule. EXAM: CT CHEST WITHOUT CONTRAST TECHNIQUE: Multidetector CT imaging of the chest was performed following the standard protocol without IV contrast. COMPARISON:  July 12, 2019. FINDINGS: Cardiovascular: Atherosclerosis of thoracic aorta is noted without aneurysm formation. Normal cardiac size. No pericardial effusion. Mediastinum/Nodes: No enlarged mediastinal or axillary lymph nodes. Thyroid gland, trachea, and esophagus demonstrate no significant findings. Lungs/Pleura: No pneumothorax or pleural effusion is noted. Grossly stable 11 mm sub solid density is noted in left lung apex best seen on image number 20 of series 8. Stable 4 mm nodule is noted in left upper lobe  best seen on image number 45 of series 8. Grossly stable 6 mm nodule is noted in left lower lobe best seen on image number 80 of series 130. Mild emphysematous disease is noted in both lungs. Upper Abdomen: No acute abnormality. Musculoskeletal: No chest wall mass or suspicious bone lesions identified. IMPRESSION: 1. Grossly stable left lung nodules are noted, the largest being sub solid density measuring 11 mm in the left lung apex. Follow-up unenhanced chest CT in 12 months is recommended to ensure stability and rule out neoplasm. Aortic Atherosclerosis (ICD10-I70.0) and Emphysema (ICD10-J43.9). Electronically Signed   By: Marijo Conception M.D.   On: 10/29/2019 16:09   MR ANGIO HEAD WO CONTRAST  Result Date: 11/20/2019 CLINICAL DATA:  Stroke, follow-up. EXAM: MRA HEAD WITHOUT CONTRAST TECHNIQUE: Angiographic images of the Circle of Willis were obtained using MRA technique without intravenous contrast. COMPARISON:  Brain MRI performed one day prior 11/19/2019. FINDINGS: The intracranial internal carotid arteries are patent without significant stenosis. The M1 middle cerebral arteries are patent without  significant stenosis. No M2 proximal branch occlusion is identified. There is a high-grade stenosis within a distal M2/proximal M3 left MCA branch vessel in the region of a known acute/early subacute infarct. The anterior cerebral arteries are patent without significant stenosis. There is a 5-6 mm superiorly projecting aneurysm arising from the left MCA bifurcation (series 8, image 81). 1-2 mm inferiorly projecting vascular protrusion arising from the supraclinoid left internal carotid artery which may reflect a tiny aneurysm or infundibulum (series 8, image 92). The intracranial vertebral arteries are patent without significant stenosis, as is the basilar artery. The posterior cerebral arteries are patent bilaterally without significant proximal stenosis. There are apparent moderate/severe stenoses within P3 posterior cerebral artery branches bilaterally (series 102, image 15) (series 102, image four). Right mastoid effusion. IMPRESSION: No intracranial large vessel occlusion. There is a high-grade stenosis within a distal M2/proximal M3 left MCA branch vessel in the region of a known acute/early subacute infarct affecting the left temporoparietal lobes. Apparent moderate/severe stenoses within P3 posterior cerebral artery branches bilaterally. 5-6 MM SUPERIORLY PROJECTING ANEURYSM ARISING FROM THE LEFT MCA BIFURCATION. NEUROSURGICAL/NEUROINTERVENTIONAL CONSULTATION IS RECOMMENDED. 1-2 mm inferiorly projecting vascular protrusion arising from the supraclinoid left ICA, which may reflect a tiny aneurysm or infundibulum. Right mastoid effusion. Electronically Signed   By: Kellie Simmering DO   On: 11/20/2019 17:46   MR BRAIN WO CONTRAST  Result Date: 11/19/2019 CLINICAL DATA:  Subacute infarct.  Slurred speech and headaches. EXAM: MRI HEAD WITHOUT CONTRAST TECHNIQUE: Multiplanar, multiecho pulse sequences of the brain and surrounding structures were obtained without intravenous contrast. COMPARISON:  Head CT  11/19/2019 FINDINGS: Brain: There is abnormal diffusion restriction within the posterior left MCA territory, involving the posterior temporal lobe. There is mild cytotoxic edema. Small focus of petechial hemorrhage at the superior extent of the infarct. Multifocal white matter hyperintensity, most commonly due to chronic ischemic microangiopathy. Normal volume of CSF spaces. No chronic microhemorrhage. Normal midline structures. Vascular: Normal flow voids. Skull and upper cervical spine: Normal marrow signal. Sinuses/Orbits: Right mastoid effusion.  Normal orbits. Other: None IMPRESSION: 1. Acute posterior left MCA territory infarct without mass effect. Small focus of petechial hemorrhage with mild cytotoxic edema. 2. Chronic ischemic microangiopathy. 3. Right mastoid effusion. Electronically Signed   By: Ulyses Jarred M.D.   On: 11/19/2019 19:14   ECHOCARDIOGRAM COMPLETE  Result Date: 11/20/2019    ECHOCARDIOGRAM REPORT   Patient Name:   Bethany [name] Date of Exam: 11/20/2019  Medical Rec #:  [id number]     Height:       65.0 in Accession #:    [id number]    Weight:       178.7 lb Date of Birth:  03-Feb-1951     BSA:          1.886 m Patient Age:    69 years      BP:           159/54 mmHg Patient Gender: F             HR:           66 bpm. Exam Location:  Inpatient Procedure: 2D Echo Indications:    Stroke  History:        Patient has prior history of Echocardiogram examinations, most                 recent 03/28/2019. Arrythmias:PAC; Risk Factors:Hypertension,                 Diabetes, Dyslipidemia and Current Smoker.  Sonographer:    Mikki Santee RDCS (AE) Referring Phys: [place]  1. Left ventricular ejection fraction, by estimation, is 60 to 65%. The left ventricle has normal function. The left ventricle has no regional wall motion abnormalities. There is mild left ventricular hypertrophy. Left ventricular diastolic parameters are consistent with Grade II diastolic dysfunction  (pseudonormalization). Elevated left ventricular end-diastolic pressure.  2. Right ventricular systolic function is normal. The right ventricular size is normal.  3. The mitral valve is normal in structure. Trivial mitral valve regurgitation. No evidence of mitral stenosis.  4. The aortic valve is tricuspid. Aortic valve regurgitation is not visualized. Mild aortic valve sclerosis is present, with no evidence of aortic valve stenosis.  5. The inferior vena cava is normal in size with greater than 50% respiratory variability, suggesting right atrial pressure of 3 mmHg. FINDINGS  Left Ventricle: Left ventricular ejection fraction, by estimation, is 60 to 65%. The left ventricle has normal function. The left ventricle has no regional wall motion abnormalities. The left ventricular internal cavity size was normal in size. There is  mild left ventricular hypertrophy. Left ventricular diastolic parameters are consistent with Grade II diastolic dysfunction (pseudonormalization). Elevated left ventricular end-diastolic pressure. Right Ventricle: The right ventricular size is normal. No increase in right ventricular wall thickness. Right ventricular systolic function is normal. Left Atrium: Left atrial size was normal in size. Right Atrium: Right atrial size was normal in size. Pericardium: There is no evidence of pericardial effusion. Mitral Valve: The mitral valve is normal in structure. Normal mobility of the mitral valve leaflets. Trivial mitral valve regurgitation. No evidence of mitral valve stenosis. Tricuspid Valve: The tricuspid valve is normal in structure. Tricuspid valve regurgitation is not demonstrated. No evidence of tricuspid stenosis. Aortic Valve: The aortic valve is tricuspid. Aortic valve regurgitation is not visualized. Mild aortic valve sclerosis is present, with no evidence of aortic valve stenosis. Pulmonic Valve: The pulmonic valve was normal in structure. Pulmonic valve regurgitation is not  visualized. No evidence of pulmonic stenosis. Aorta: The aortic root is normal in size and structure. Venous: The inferior vena cava is normal in size with greater than 50% respiratory variability, suggesting right atrial pressure of 3 mmHg. IAS/Shunts: No atrial level shunt detected by color flow Doppler.  LEFT VENTRICLE PLAX 2D LVIDd:         4.90 cm  Diastology LVIDs:  2.60 cm  LV e' lateral:   6.20 cm/s LV PW:         1.20 cm  LV E/e' lateral: 19.7 LV IVS:        1.20 cm  LV e' medial:    5.66 cm/s LVOT diam:     2.00 cm  LV E/e' medial:  21.6 LV SV:         110 LV SV Index:   58 LVOT Area:     3.14 cm  RIGHT VENTRICLE RV S prime:     11.00 cm/s TAPSE (M-mode): 2.2 cm LEFT ATRIUM             Index       RIGHT ATRIUM           Index LA diam:        3.40 cm 1.80 cm/m  RA Area:     12.00 cm LA Vol (A2C):   46.6 ml 24.71 ml/m RA Volume:   22.40 ml  11.88 ml/m LA Vol (A4C):   96.6 ml 51.23 ml/m LA Biplane Vol: 71.1 ml 37.70 ml/m  AORTIC VALVE LVOT Vmax:   161.00 cm/s LVOT Vmean:  96.700 cm/s LVOT VTI:    0.350 m  AORTA Ao Root diam: 3.10 cm MITRAL VALVE MV Area (PHT): 2.95 cm     SHUNTS MV Decel Time: 257 msec     Systemic VTI:  0.35 m MV E velocity: 122.00 cm/s  Systemic Diam: 2.00 cm MV A velocity: 96.60 cm/s MV E/A ratio:  1.26 Jenkins Rouge MD Electronically signed by Jenkins Rouge MD Signature Date/Time: 11/20/2019/1:41:45 PM    Final    VAS US CAROTID  Result Date: 11/21/2019 Carotid Arterial Duplex Study Indications:  CVA. Risk Factors: Hypertension, Diabetes. Performing Technologist: Oliver Hum RVT  Examination Guidelines: A complete evaluation includes B-mode imaging, spectral Doppler, color Doppler, and power Doppler as needed of all accessible portions of each vessel. Bilateral testing is considered an integral part of a complete examination. Limited examinations for reoccurring indications may be performed as noted.  Right Carotid Findings:  +----------+--------+--------+--------+-----------------------+--------+           PSV cm/sEDV cm/sStenosisPlaque Description     Comments +----------+--------+--------+--------+-----------------------+--------+ CCA Prox  111     14              smooth and heterogenous         +----------+--------+--------+--------+-----------------------+--------+ CCA Distal85      13              smooth and heterogenous         +----------+--------+--------+--------+-----------------------+--------+ ICA Prox  112     20              smooth and heterogenous         +----------+--------+--------+--------+-----------------------+--------+ ICA Distal77      20                                     tortuous +----------+--------+--------+--------+-----------------------+--------+ ECA       99      10                                              +----------+--------+--------+--------+-----------------------+--------+ +----------+--------+-------+--------+-------------------+           PSV  cm/sEDV cmsDescribeArm Pressure (mmHG) +----------+--------+-------+--------+-------------------+ [id number]                                        +----------+--------+-------+--------+-------------------+ +---------+--------+--+--------+--+---------+ VertebralPSV cm/s82EDV cm/s13Antegrade +---------+--------+--+--------+--+---------+  Left Carotid Findings: +----------+--------+--------+--------+-----------------------+--------+           PSV cm/sEDV cm/sStenosisPlaque Description     Comments +----------+--------+--------+--------+-----------------------+--------+ CCA Prox  93      12              smooth and heterogenous         +----------+--------+--------+--------+-----------------------+--------+ CCA Distal92      16              smooth and heterogenous         +----------+--------+--------+--------+-----------------------+--------+ ICA Prox  95      15               smooth and heterogenous         +----------+--------+--------+--------+-----------------------+--------+ ICA Distal91      16                                     tortuous +----------+--------+--------+--------+-----------------------+--------+ ECA       76      9                                               +----------+--------+--------+--------+-----------------------+--------+ +----------+--------+--------+--------+-------------------+           PSV cm/sEDV cm/sDescribeArm Pressure (mmHG) +----------+--------+--------+--------+-------------------+ [id number]                                         +----------+--------+--------+--------+-------------------+ +---------+--------+--+--------+-+---------+ VertebralPSV cm/s33EDV cm/s5Antegrade +---------+--------+--+--------+-+---------+   Summary: Right Carotid: Velocities in the right ICA are consistent with a 1-39% stenosis. Left Carotid: Velocities in the left ICA are consistent with a 1-39% stenosis. Vertebrals: Bilateral vertebral arteries demonstrate antegrade flow. *See table(s) above for measurements and observations.     Preliminary        Subjective: Her paresthesias on the right side are improving.  She is ambulating to the bathroom without assistance.  She still has some occasional aphasia, but this is improving.  No focal weakness.  Discharge Exam: Vitals:   11/22/19 0032 11/22/19 0400  BP: (!) 157/60 (!) 158/57  Pulse: (!) 50 (!) 47  Resp: 16 18  Temp: 97.9 F (36.6 C) 98.2 F (36.8 C)  SpO2: 100% 100%   Vitals:   11/21/19 1617 11/21/19 2044 11/22/19 0032 11/22/19 0400  BP: (!) 157/53 (!) 148/66 (!) 157/60 (!) 158/57  Pulse: (!) 59 (!) 56 (!) 50 (!) 47  Resp: 18 18 16 18   Temp: 98.5 F (36.9 C) 98.7 F (37.1 C) 97.9 F (36.6 C) 98.2 F (36.8 C)  TempSrc: Oral Oral Oral Oral  SpO2: 100% 100% 100% 100%  Weight:      Height:        General: Pt is alert, awake, not in acute  distress Cardiovascular: RRR, nl S1-S2, no murmurs appreciated.   No LE edema.   Respiratory: Normal respiratory rate and rhythm.  CTAB without rales or wheezes. Abdominal:  Abdomen soft and non-tender.  No distension or HSM.   Neuro/Psych: Strength symmetric in upper and lower extremities.  Judgment and insight appear normal.   The results of significant diagnostics from this hospitalization (including imaging, microbiology, ancillary and laboratory) are listed below for reference.     Microbiology: Recent Results (from the past 240 hour(s))  SARS Coronavirus 2 by RT PCR (hospital order, performed in [facility] hospital lab) Nasopharyngeal Nasopharyngeal Swab     Status: None   Collection Time: 11/19/19  3:06 PM   Specimen: Nasopharyngeal Swab  Result Value Ref Range Status   SARS Coronavirus 2 NEGATIVE NEGATIVE Final    Comment: (NOTE) SARS-CoV-2 target nucleic acids are NOT DETECTED.  The SARS-CoV-2 RNA is generally detectable in upper and lower respiratory specimens during the acute phase of infection. The lowest concentration of SARS-CoV-2 viral copies this assay can detect is 250 copies / mL. A negative result does not preclude SARS-CoV-2 infection and should not be used as the sole basis for treatment or other patient management decisions.  A negative result may occur with improper specimen collection / handling, submission of specimen other than nasopharyngeal swab, presence of viral mutation(s) within the areas targeted by this assay, and inadequate number of viral copies (<250 copies / mL). A negative result must be combined with clinical observations, patient history, and epidemiological information.  Fact Sheet for Patients:   StrictlyIdeas.no  Fact Sheet for Healthcare Providers: BankingDealers.co.za  This test is not yet approved or  cleared by the [place] FDA and has been authorized for detection and/or  diagnosis of SARS-CoV-2 by FDA under an Emergency Use Authorization (EUA).  This EUA will remain in effect (meaning this test can be used) for the duration of the COVID-19 declaration under Section 564(b)(1) of the Act, 21 U.S.C. section 360bbb-3(b)(1), unless the authorization is terminated or revoked sooner.  Performed at [facility], [place] [address] [place], [place] [address]      Labs: BNP (last 3 results) Recent Labs    03/27/19 2157  BNP 062.6*   Basic Metabolic Panel: Recent Labs  Lab 11/19/19 1317 11/20/19 0433  NA 137 139  K 4.2 4.2  CL 100 103  CO2 26 26  GLUCOSE 184* 131*  BUN 20 16  CREATININE 1.56* 1.01*  1.04*  CALCIUM 9.9 9.9  MG  --  1.8   Liver Function Tests: Recent Labs  Lab 11/19/19 1317  AST 20  ALT 23  ALKPHOS 159*  BILITOT 0.7  PROT 6.9  ALBUMIN 3.9   No results for input(s): LIPASE, AMYLASE in the last 168 hours. No results for input(s): AMMONIA in the last 168 hours. CBC: Recent Labs  Lab 11/19/19 1317 11/20/19 0433  WBC 8.8 7.0  NEUTROABS 6.0 4.9  HGB 10.1* 9.9*  HCT 34.9* 33.4*  MCV 81.7 82.5  PLT 238 208   Cardiac Enzymes: No results for input(s): CKTOTAL, CKMB, CKMBINDEX, TROPONINI in the last 168 hours. BNP: Invalid input(s): POCBNP CBG: Recent Labs  Lab 11/21/19 0559 11/21/19 1108 11/21/19 1620 11/21/19 2143 11/22/19 0608  GLUCAP 192* 181* 161* 203* 155*   D-Dimer No results for input(s): DDIMER in the last 72 hours. Hgb A1c Recent Labs    11/19/19 1739 11/20/19 0433  HGBA1C 5.9* 5.7*   Lipid Profile Recent Labs    11/20/19 0400  CHOL 179  HDL 35*  LDLCALC 91  TRIG 267*  CHOLHDL 5.1   Thyroid function studies Recent Labs  11/20/19 0433  TSH 3.079   Anemia work up No results for input(s): VITAMINB12, FOLATE, FERRITIN, TIBC, IRON, RETICCTPCT in the last 72 hours. Urinalysis    Component Value Date/Time   COLORURINE YELLOW 11/19/2019 2030   [place]  11/19/2019 2030   [place] 1.013 11/19/2019 2030   [place] 5.0 11/19/2019 2030   GLUCOSEU NEGATIVE 11/19/2019 2030   [place] NEGATIVE 11/19/2019 2030   [place] NEGATIVE 11/19/2019 2030   [place] 11/19/2019 2030   PROTEINUR NEGATIVE 11/19/2019 2030   NITRITE POSITIVE (A) 11/19/2019 2030   LEUKOCYTESUR TRACE (A) 11/19/2019 2030   Sepsis Labs Invalid input(s): PROCALCITONIN,  WBC,  LACTICIDVEN Microbiology Recent Results (from the past 240 hour(s))  SARS Coronavirus 2 by RT PCR (hospital order, performed in [facility] lab) Nasopharyngeal Nasopharyngeal Swab     Status: None   Collection Time: 11/19/19  3:06 PM   Specimen: Nasopharyngeal Swab  Result Value Ref Range Status   SARS Coronavirus 2 NEGATIVE NEGATIVE Final    Comment: (NOTE) SARS-CoV-2 target nucleic acids are NOT DETECTED.  The SARS-CoV-2 RNA is generally detectable in upper and lower respiratory specimens during the acute phase of infection. The lowest concentration of SARS-CoV-2 viral copies this assay can detect is 250 copies / mL. A negative result does not preclude SARS-CoV-2 infection and should not be used as the sole basis for treatment or other patient management decisions.  A negative result may occur with improper specimen collection / handling, submission of specimen other than nasopharyngeal swab, presence of viral mutation(s) within the areas targeted by this assay, and inadequate number of viral copies (<250 copies / mL). A negative result must be combined with clinical observations, patient history, and epidemiological information.  Fact Sheet for Patients:   StrictlyIdeas.no  Fact Sheet for Healthcare Providers: BankingDealers.co.za  This test is not yet approved or  cleared by the [place] FDA and has been authorized for detection and/or diagnosis of SARS-CoV-2 by FDA under an Emergency Use Authorization (EUA).  This EUA will  remain in effect (meaning this test can be used) for the duration of the COVID-19 declaration under Section 564(b)(1) of the Act, 21 U.S.C. section 360bbb-3(b)(1), unless the authorization is terminated or revoked sooner.  Performed at [facility], [place] [address] [place], [place] [address]      Time coordinating discharge: 35 minutes The [place] controlled substances registry was reviewed for this patient prior to filling the <5 days supply controlled substances script.      SIGNED:   Edwin Dada, MD  [facility] 11/22/2019, 8:02 AM

## 2019-11-22 NOTE — Progress Notes (Signed)
Physical Therapy Treatment Patient Details Name: Bethany [name] MRN: [id number] DOB: Oct 02, 1950 Today's Date: 11/22/2019    History of Present Illness 69 y.o. female with history of thrombosis, hypertension, hyperlipidemia, hypercalcemia, diabetes admitted with headache and difficulty with speech, onset was 11/16/19. Imaging showed acute posterior L MCA territory infarct.    PT Comments    Pt was seen for mobility on RW after directly assisting her to stand in practice.  Listing backward, requires min to mod assist with transfers, cues for hand placement every trial and instability at knees related to strength.  Pt is still appropriate for rehab stay, and today did note HA from effort to work on word finding with speech therapy.  Has been given pain meds that have not fully started to help yet.  Lateral L neck is source of pain and pointing to the facets on C-spine.  Follow up with her on all goals of PT and will work toward stairs when able to safely perform them.  Follow Up Recommendations  CIR     Equipment Recommendations  Rolling walker with 5" wheels    Recommendations for Other Services       Precautions / Restrictions Precautions Precautions: Fall Precaution Comments: buckling knees with gait Restrictions Weight Bearing Restrictions: No    Mobility  Bed Mobility Overal bed mobility: Needs Assistance Bed Mobility: Supine to Sit;Sit to Supine     Supine to sit: Min guard Sit to supine: Min assist   General bed mobility comments: min assist to get legs onto bed  Transfers Overall transfer level: Needs assistance Equipment used: Rolling walker (2 wheeled) Transfers: Sit to/from Stand Sit to Stand: Mod assist         General transfer comment: helped to power up then min to maintain balance at side of bed on RW  Ambulation/Gait Ambulation/Gait assistance: Min assist Gait Distance (Feet): 30 Feet (6 x 5) Assistive device: Rolling walker (2 wheeled);1 person hand  held assist (PT assisting to move walker) Gait Pattern/deviations: Step-to pattern;Wide base of support;Decreased stride length Gait velocity: reduced Gait velocity interpretation: <1.8 ft/sec, indicate of risk for recurrent falls General Gait Details: unsteady with knees, requires continual contact and assist to shift walker on side of bed   Stairs             Wheelchair Mobility    Modified Rankin (Stroke Patients Only)       Balance Overall balance assessment: History of Falls;Needs assistance Sitting-balance support: Feet supported Sitting balance-Leahy Scale: Fair     Standing balance support: Bilateral upper extremity supported;During functional activity Standing balance-Leahy Scale: Poor                              Cognition Arousal/Alertness: Awake/alert Behavior During Therapy: WFL for tasks assessed/performed Overall Cognitive Status: Within Functional Limits for tasks assessed                                 General Comments: pt is working to speak clearly      Exercises General Exercises - Lower Extremity Long Arc Quad: Strengthening;AAROM;10 reps Heel Slides: Strengthening;AAROM;10 reps    General Comments General comments (skin integrity, edema, etc.): pt is getting up to side of bed with PT, initially could not motor plan to stand on walker until PT assisted directly.  Listed backward with direct assist, but more controlled with  PT and walker      Pertinent Vitals/Pain Pain Assessment: Faces Faces Pain Scale: Hurts whole lot Pain Location: L side of neck Pain Descriptors / Indicators: Sore Pain Intervention(s): Monitored during session;Repositioned;Limited activity within patient's tolerance    Home Living                      Prior Function            PT Goals (current goals can now be found in the care plan section) Acute Rehab PT Goals Patient Stated Goal: return to independence Progress towards PT  goals: Progressing toward goals    Frequency    Min 4X/week      PT Plan Current plan remains appropriate    Co-evaluation              AM-PAC PT "6 Clicks" Mobility   Outcome Measure  Help needed turning from your back to your side while in a flat bed without using bedrails?: A Little Help needed moving from lying on your back to sitting on the side of a flat bed without using bedrails?: A Little Help needed moving to and from a bed to a chair (including a wheelchair)?: A Lot Help needed standing up from a chair using your arms (e.g., wheelchair or bedside chair)?: A Lot Help needed to walk in hospital room?: A Lot Help needed climbing 3-5 steps with a railing? : Total 6 Click Score: 13    End of Session Equipment Utilized During Treatment: Gait belt Activity Tolerance: Patient limited by fatigue;Patient limited by pain Patient left: in bed;with call bell/phone within reach;with bed alarm set Nurse Communication: Mobility status PT Visit Diagnosis: Difficulty in walking, not elsewhere classified (R26.2)     Time: 0459-9774 PT Time Calculation (min) (ACUTE ONLY): 28 min  Charges:  $Gait Training: 8-22 mins $Therapeutic Exercise: 8-22 mins                   Ramond Dial 11/22/2019, 1:30 PM  Mee Hives, PT MS Acute Rehab Dept. Number: [place]

## 2019-11-22 NOTE — Progress Notes (Signed)
Nsg Discharge Note  Admit Date:  11/19/2019 Discharge date: 11/22/2019   Bethany [name] to be D/C'd Home per MD order.  AVS completed.   Patient/caregiver able to verbalize understanding.  Discharge Medication: Allergies as of 11/22/2019      Reactions   Contrast Media [iodinated Diagnostic Agents]    Renal hypertension per physician      Medication List    TAKE these medications   allopurinol 300 MG tablet Commonly known as: ZYLOPRIM Take 1 tablet (300 mg total) by mouth daily. Notes to patient: Tonight, 11/22/19.   amLODipine 10 MG tablet Commonly known as: NORVASC Take 10 mg by mouth daily. Notes to patient: Tomorrow, 11/23/19.   APPLE CIDER VINEGAR PO Take 1 tablet by mouth daily. Notes to patient: Tomorrow, 11/23/19.   aspirin 81 MG EC tablet Take 1 tablet (81 mg total) by mouth daily. Swallow whole. Notes to patient: Tomorrow, 11/23/19.   atorvastatin 80 MG tablet Commonly known as: LIPITOR Take 1 tablet (80 mg total) by mouth daily. What changed:   medication strength  how much to take Notes to patient: Tomorrow, 11/23/19.   baclofen 10 MG tablet Commonly known as: LIORESAL Take 10 mg by mouth 3 (three) times daily as needed for muscle spasms.   buprenorphine 20 MCG/HR Ptwk Commonly known as: BUTRANS Place 1 patch onto the skin once a week. Notes to patient: Resume home schedule.   clopidogrel 75 MG tablet Commonly known as: PLAVIX Take 1 tablet (75 mg total) by mouth daily. Notes to patient: Tomorrow, 11/23/19.   ezetimibe 10 MG tablet Commonly known as: ZETIA Take 10 mg by mouth daily. Notes to patient: Tomorrow, 11/23/19.   feeding supplement (ENSURE ENLIVE) Liqd Take 237 mLs by mouth 2 (two) times daily between meals. Notes to patient: This afternoon, 11/22/19.   furosemide 40 MG tablet Commonly known as: Lasix Take 1 tablet (40 mg total) by mouth daily as needed.   gabapentin 600 MG tablet Commonly known as: NEURONTIN Take 600 mg by mouth 4 (four)  times daily. Notes to patient: This afternoon, 11/22/19.   HumaLOG KwikPen 100 UNIT/ML KiwkPen Generic drug: insulin lispro Inject 24 Units into the skin daily with supper. Notes to patient: Tonight, 11/22/19.   Ipratropium-Albuterol 20-100 MCG/ACT Aers respimat Commonly known as: COMBIVENT Inhale 1 puff into the lungs every 6 (six) hours as needed for wheezing or shortness of breath.   levothyroxine 75 MCG tablet Commonly known as: SYNTHROID Take 75 mcg by mouth daily. Notes to patient: Tomorrow, 11/23/19.   lidocaine-prilocaine cream Commonly known as: EMLA Apply 1 application topically as needed.   losartan 25 MG tablet Commonly known as: Cozaar Take 1 tablet (25 mg total) by mouth daily.   metoprolol tartrate 50 MG tablet Commonly known as: LOPRESSOR Take 50 mg by mouth 2 (two) times daily. Notes to patient:  Tonight, 11/22/19.   oxyCODONE-acetaminophen 10-325 MG tablet Commonly known as: PERCOCET Take 1 tablet by mouth every 6 (six) hours as needed for pain.   pantoprazole 40 MG tablet Commonly known as: PROTONIX Take 40 mg by mouth daily.   pregabalin 25 MG capsule Commonly known as: LYRICA Take 25 mg by mouth 2 (two) times daily. Notes to patient: Tonight, 11/22/19.   senna 8.6 MG Tabs tablet Commonly known as: SENOKOT Take 1 tablet by mouth. Notes to patient: Tomorrow, 11/23/19.   Toujeo SoloStar 300 UNIT/ML Sopn Generic drug: Insulin Glargine Inject 34 Units into the skin at bedtime.   Trulicity 1.5 EZ/6.6QH  Sopn Generic drug: Dulaglutide Inject 1.5 mg into the skin once a week. Mondays Notes to patient: Resume home schedule.    Vitamin D (Ergocalciferol) 1.25 MG (50000 UNIT) Caps capsule Commonly known as: DRISDOL Take 50,000 Units by mouth every 7 (seven) days. Notes to patient: Resume home schedule.   Xtampza ER 27 MG C12a Generic drug: oxyCODONE ER Take 1 capsule by mouth 2 (two) times daily. Notes to patient: This evening, 11/23/19.             Durable Medical Equipment  (From admission, onward)         Start     Ordered   11/22/19 1124  For home use only DME Walker rolling  Once       Question Answer Comment  Walker: With 5 Inch Wheels   Patient needs a walker to treat with the following condition Stroke ([place])      11/22/19 1123          Discharge Assessment: Vitals:   11/22/19 0400 11/22/19 0939  BP: (!) 158/57 (!) 156/60  Pulse: (!) 47 (!) 54  Resp: 18 16  Temp: 98.2 F (36.8 C) 98.4 F (36.9 C)  SpO2: 100% 100%   Skin clean, dry and intact without evidence of skin break down, no evidence of skin tears noted. IV catheter discontinued intact. Site without signs and symptoms of complications - no redness or edema noted at insertion site, patient denies c/o pain - only slight tenderness at site.  Dressing with slight pressure applied.  D/c Instructions-Education: Discharge instructions given to patient/family with verbalized understanding. D/c education completed with patient/family including follow up instructions, medication list, d/c activities limitations if indicated, with other d/c instructions as indicated by MD - patient able to verbalize understanding, all questions fully answered. Patient instructed to return to ED, call 911, or call MD for any changes in condition.  Patient escorted via [place], and D/C home via private auto.  Joni Reining, RN 11/22/2019 4:05 PM

## 2019-11-22 NOTE — Progress Notes (Signed)
STROKE TEAM PROGRESS NOTE   INTERVAL HISTORY Patient appears to be improving she still has some mild expressive speech difficulties.  Vital signs are stable. Therapist have recommended inpatient rehab but no beds available and Dr. Loleta Books plans to DC patient home with home therapies.. Vitals:   11/21/19 2044 11/22/19 0032 11/22/19 0400 11/22/19 0939  BP: (!) 148/66 (!) 157/60 (!) 158/57 (!) 156/60  Pulse: (!) 56 (!) 50 (!) 47 (!) 54  Resp: 18 16 18 16   Temp: 98.7 F (37.1 C) 97.9 F (36.6 C) 98.2 F (36.8 C) 98.4 F (36.9 C)  TempSrc: Oral Oral Oral Oral  SpO2: 100% 100% 100% 100%  Weight:      Height:       CBC:  Recent Labs  Lab 11/20/19 0433 11/22/19 1139  WBC 7.0 5.5  NEUTROABS 4.9 3.9  HGB 9.9* 10.1*  HCT 33.4* 34.8*  MCV 82.5 80.2  PLT 208 951   Basic Metabolic Panel:  Recent Labs  Lab 11/20/19 0433 11/22/19 1139  NA 139 136  K 4.2 4.2  CL 103 103  CO2 26 25  GLUCOSE 131* 169*  BUN 16 12  CREATININE 1.01*  1.04* 1.03*  CALCIUM 9.9 9.8  MG 1.8 1.8   Lipid Panel:  Recent Labs  Lab 11/20/19 0400  CHOL 179  TRIG 267*  HDL 35*  CHOLHDL 5.1  VLDL 53*  LDLCALC 91   HgbA1c:  Recent Labs  Lab 11/20/19 0433  HGBA1C 5.7*   Urine Drug Screen:  Recent Labs  Lab 11/19/19 2030  LABOPIA POSITIVE*  COCAINSCRNUR NONE DETECTED  LABBENZ NONE DETECTED  AMPHETMU NONE DETECTED  THCU NONE DETECTED  LABBARB NONE DETECTED    Alcohol Level  Recent Labs  Lab 11/19/19 1317  ETH <10    IMAGING past 24 hours No results found.  PHYSICAL EXAM Pleasant obese middle-aged African-American lady not in distress. . Afebrile. Head is nontraumatic. Neck is supple without bruit.    Cardiac exam no murmur or gallop. Lungs are clear to auscultation. Distal pulses are well felt. Neurological Exam ;  Awake  Alert oriented x 3.  Mild expressive aphasia with hesitant nonfluent speech and l word finding difficulties.  Good comprehension naming and repetition.[place]  movements full without nystagmus.fundi were not visualized. Vision acuity and fields appear normal. Hearing is normal. Palatal movements are normal. Face symmetric. Tongue midline. Normal strength, tone, reflexes and coordination. Normal sensation. Gait deferred.  ASSESSMENT/PLAN Ms. Bethany [name] is a 69 y.o. female with history of  thrombosis, hypertension, hyperlipidemia, hypercalcemia, diabetes presenting with sudden onset HA and getting her words out on  7/3 (5 days prior). MRI confirmed infarct.   Stroke:   L MCA infarct w/ petechial hemorrhage, embolic secondary to unclear source.  Left MCA bifurcation 5 to 6 mm with aneurysm likely incidental and asymptomatic  CT head likely subacute L temporal lobe infarct   MRI  Acute posterior L MCA infarct w/ small petechial hemorrhage w/ mild edema. Small vessel disease. R mastoid effusion  MRA  No LVO. L M2/M3 high-grade stenosis.  B P3 moderate / severe stenosis. 5-53mm L MCA bifurcation aneurysm. 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 No source of embolus   Considered loop placement to look for AF, however, in setting of anemia and 5-72mm aneurysm, pt not a good long-term AC candidate. Will not place loop.  LDL 91  HgbA1c  5.7  VTE prophylaxis - lovenox 1 dose given then stopped. Added SCDs   No antithrombotic prior to admission, now on aspirin 325 mg daily. Will decrease aspirin to 81 and add plavix. Continue DAPT x 3 MONTHS given intracranial atherosclerosism then as aspirin alone.  Therapy recommendations:  CIR  Disposition: Home  Hypertension  Stable . Permissive hypertension (OK if < 220/120) but gradually normalize in 5-7 days . Long-term BP goal normotensive  Hyperlipidemia  Home meds:  lipitor 40 + zetia 10  Now on lipitor 80 + zetia 10  LDL 91, goal < 70  Continue statin at discharge  Other Stroke Risk Factors  Advanced  age  Cigarette smoker, advised to stop smoking  ETOH use, alcohol level <10, advised to drink no more than 1 drink(s) a day  Obesity, Body mass index is 30.82 kg/m., recommend weight loss, diet and exercise as appropriate   Other Active Problems  Chronic anemia Hgb 9.9  Single kidney, Cre 1.56->1.01  Hospital day # 3 Patient presented with aphasia secondary to left MCA branch infarct.  Etiology unclear but probably related to intracranial atherosclerosis.  Patient has chronic anemia and gets periodic iron infusion and may not be the best candidate for long-term anticoagulation hence will not consider placing in the loop recorder for paroxysmal A. fib.  She also has a 5 to 6 mm left MCA bifurcation aneurysm which is likely asymptomatic and can be managed conservatively for now.  We will address this on outpatient office visits and consider referral to endovascular interventional radiology/neurosurgery later.  Recommend aspirin and Plavix for 3 months followed by aspirin alone and aggressive risk factor modification.  Discussed with patient and Dr. Loleta Books.  Stroke team will sign off.  Kindly call for questions follow-up as an outpatient stroke clinic in 6 weeks Antony Contras, MD To contact Stroke Continuity provider, please refer to [email or web address] After hours, contact General Neurology

## 2019-11-22 NOTE — Progress Notes (Signed)
Patient at this time still needs medications and possible lab draw. RN Danae Chen to replace the IV team consult to deaccess the port when medications are complete.

## 2019-11-22 NOTE — Plan of Care (Signed)
  Problem: Education: Goal: Knowledge of General Education information will improve Description: Including pain rating scale, medication(s)/side effects and non-pharmacologic comfort measures 11/22/2019 1604 by Joni Reining, RN Outcome: Adequate for Discharge 11/22/2019 1457 by Joni Reining, RN Outcome: Adequate for Discharge   Problem: Health Behavior/Discharge Planning: Goal: Ability to manage health-related needs will improve 11/22/2019 1604 by Joni Reining, RN Outcome: Adequate for Discharge 11/22/2019 1457 by Joni Reining, RN Outcome: Adequate for Discharge   Problem: Clinical Measurements: Goal: Respiratory complications will improve Outcome: Adequate for Discharge Goal: Cardiovascular complication will be avoided Outcome: Adequate for Discharge   Problem: Activity: Goal: Risk for activity intolerance will decrease 11/22/2019 1604 by Joni Reining, RN Outcome: Adequate for Discharge 11/22/2019 1457 by Joni Reining, RN Outcome: Adequate for Discharge   Problem: Nutrition: Goal: Adequate nutrition will be maintained 11/22/2019 1604 by Joni Reining, RN Outcome: Adequate for Discharge 11/22/2019 1457 by Joni Reining, RN Outcome: Adequate for Discharge   Problem: Coping: Goal: Level of anxiety will decrease 11/22/2019 1604 by Joni Reining, RN Outcome: Adequate for Discharge 11/22/2019 1457 by Joni Reining, RN Outcome: Adequate for Discharge   Problem: Elimination: Goal: Will not experience complications related to bowel motility Outcome: Adequate for Discharge Goal: Will not experience complications related to urinary retention Outcome: Adequate for Discharge   Problem: Pain Managment: Goal: General experience of comfort will improve 11/22/2019 1604 by Joni Reining, RN Outcome: Adequate for Discharge 11/22/2019 1457 by Joni Reining, RN Outcome: Adequate for Discharge   Problem: Skin Integrity: Goal: Risk for impaired skin integrity will decrease Outcome:  Adequate for Discharge   Problem: Education: Goal: Knowledge of disease or condition will improve Outcome: Adequate for Discharge Goal: Knowledge of secondary prevention will improve Outcome: Adequate for Discharge Goal: Knowledge of patient specific risk factors addressed and post discharge goals established will improve Outcome: Adequate for Discharge Goal: Individualized Educational Video(s) Outcome: Adequate for Discharge   Problem: Coping: Goal: Will verbalize positive feelings about self Outcome: Adequate for Discharge Goal: Will identify appropriate support needs Outcome: Adequate for Discharge   Problem: Health Behavior/Discharge Planning: Goal: Ability to manage health-related needs will improve Outcome: Adequate for Discharge   Problem: Self-Care: Goal: Ability to participate in self-care as condition permits will improve Outcome: Adequate for Discharge Goal: Verbalization of feelings and concerns over difficulty with self-care will improve Outcome: Adequate for Discharge Goal: Ability to communicate needs accurately will improve Outcome: Adequate for Discharge   Problem: Nutrition: Goal: Risk of aspiration will decrease Outcome: Adequate for Discharge Goal: Dietary intake will improve Outcome: Adequate for Discharge   Problem: Ischemic Stroke/TIA Tissue Perfusion: Goal: Complications of ischemic stroke/TIA will be minimized Outcome: Adequate for Discharge

## 2019-11-22 NOTE — Progress Notes (Signed)
Inpatient Rehab Admissions:  Inpatient Rehab Consult received.  I met with patient at the bedside for rehabilitation assessment and to discuss goals and expectations of an inpatient rehab admission.  She is not interested in pursuing CIR at this time.  Per TOC, sister agreeable to providing assist at home.  Will sign off.    Signed: Shann Medal, PT, DPT Admissions Coordinator [phone] 11/22/19  2:37 PM

## 2019-11-22 NOTE — TOC Transition Note (Signed)
Transition of Care [facility]) - CM/SW Discharge Note   Patient Details  Name: Bethany [name] MRN: [id number] Date of Birth: [birth year]  [facility] CM/SW Contact:  Pollie Friar, RN Phone Number: 11/22/2019, 3:42 PM   Clinical Narrative:    Pt has decided not to do rehab and d/c home with assistance from her family. Pts daughter lives across the hall and she says her son and niece can provided supervision at home.  Pt had walker delivered to the room from [facility]. Pt requesting outpatient therapy and states her family can provide needed assistance. Orders placed for [facility] and information on the AVS. Daughter to provide transport home.    Final next level of care: OP Rehab Barriers to Discharge: No Barriers Identified   Patient Goals and CMS Choice   CMS Medicare.gov Compare Post Acute Care list provided to:: Patient Choice offered to / list presented to : Patient  Discharge Placement                       Discharge Plan and Services                DME Arranged: Walker rolling DME Agency: [employer] Date DME Agency Contacted: 11/22/19   Representative spoke with at DME Agency: [place] ([place] Interventions     Readmission Risk Interventions No flowsheet data found.

## 2019-12-06 ENCOUNTER — Inpatient Hospital Stay: Payer: Medicare Other

## 2019-12-06 ENCOUNTER — Inpatient Hospital Stay (HOSPITAL_BASED_OUTPATIENT_CLINIC_OR_DEPARTMENT_OTHER): Payer: Medicare Other | Admitting: Hematology

## 2019-12-06 ENCOUNTER — Other Ambulatory Visit: Payer: Self-pay

## 2019-12-06 ENCOUNTER — Telehealth: Payer: Self-pay

## 2019-12-06 VITALS — BP 123/49 | HR 74 | Temp 98.2°F | Resp 18

## 2019-12-06 DIAGNOSIS — K5520 Angiodysplasia of colon without hemorrhage: Secondary | ICD-10-CM

## 2019-12-06 DIAGNOSIS — I129 Hypertensive chronic kidney disease with stage 1 through stage 4 chronic kidney disease, or unspecified chronic kidney disease: Secondary | ICD-10-CM | POA: Diagnosis not present

## 2019-12-06 DIAGNOSIS — E1122 Type 2 diabetes mellitus with diabetic chronic kidney disease: Secondary | ICD-10-CM | POA: Diagnosis not present

## 2019-12-06 DIAGNOSIS — E1142 Type 2 diabetes mellitus with diabetic polyneuropathy: Secondary | ICD-10-CM | POA: Diagnosis not present

## 2019-12-06 DIAGNOSIS — D649 Anemia, unspecified: Secondary | ICD-10-CM

## 2019-12-06 DIAGNOSIS — D500 Iron deficiency anemia secondary to blood loss (chronic): Secondary | ICD-10-CM

## 2019-12-06 DIAGNOSIS — Q2733 Arteriovenous malformation of digestive system vessel: Secondary | ICD-10-CM | POA: Diagnosis not present

## 2019-12-06 DIAGNOSIS — Z95828 Presence of other vascular implants and grafts: Secondary | ICD-10-CM

## 2019-12-06 DIAGNOSIS — J449 Chronic obstructive pulmonary disease, unspecified: Secondary | ICD-10-CM | POA: Diagnosis not present

## 2019-12-06 DIAGNOSIS — N183 Chronic kidney disease, stage 3 unspecified: Secondary | ICD-10-CM | POA: Diagnosis not present

## 2019-12-06 LAB — CBC WITH DIFFERENTIAL (CANCER CENTER ONLY)
Abs Immature Granulocytes: 0.1 10*3/uL — ABNORMAL HIGH (ref 0.00–0.07)
Basophils Absolute: 0 10*3/uL (ref 0.0–0.1)
Basophils Relative: 0 %
Eosinophils Absolute: 0.3 10*3/uL (ref 0.0–0.5)
Eosinophils Relative: 2 %
HCT: 23.9 % — ABNORMAL LOW (ref 36.0–46.0)
Hemoglobin: 6.9 g/dL — CL (ref 12.0–15.0)
Immature Granulocytes: 1 %
Lymphocytes Relative: 9 %
Lymphs Abs: 1.3 10*3/uL (ref 0.7–4.0)
MCH: 24.5 pg — ABNORMAL LOW (ref 26.0–34.0)
MCHC: 28.9 g/dL — ABNORMAL LOW (ref 30.0–36.0)
MCV: 84.8 fL (ref 80.0–100.0)
Monocytes Absolute: 0.9 10*3/uL (ref 0.1–1.0)
Monocytes Relative: 6 %
Neutro Abs: 11.1 10*3/uL — ABNORMAL HIGH (ref 1.7–7.7)
Neutrophils Relative %: 82 %
Platelet Count: 242 10*3/uL (ref 150–400)
RBC: 2.82 MIL/uL — ABNORMAL LOW (ref 3.87–5.11)
RDW: 19.1 % — ABNORMAL HIGH (ref 11.5–15.5)
WBC Count: 13.6 10*3/uL — ABNORMAL HIGH (ref 4.0–10.5)
nRBC: 0.7 % — ABNORMAL HIGH (ref 0.0–0.2)

## 2019-12-06 LAB — FERRITIN: Ferritin: 158 ng/mL (ref 11–307)

## 2019-12-06 LAB — IRON AND TIBC
Iron: 30 ug/dL — ABNORMAL LOW (ref 41–142)
Saturation Ratios: 9 % — ABNORMAL LOW (ref 21–57)
TIBC: 320 ug/dL (ref 236–444)
UIBC: 290 ug/dL (ref 120–384)

## 2019-12-06 LAB — PREPARE RBC (CROSSMATCH)

## 2019-12-06 MED ORDER — SODIUM CHLORIDE 0.9% IV SOLUTION
250.0000 mL | Freq: Once | INTRAVENOUS | Status: DC
  Filled 2019-12-06: qty 250

## 2019-12-06 MED ORDER — SODIUM CHLORIDE 0.9 % IV SOLN
Freq: Once | INTRAVENOUS | Status: AC
  Filled 2019-12-06: qty 250

## 2019-12-06 MED ORDER — SODIUM CHLORIDE 0.9% FLUSH
10.0000 mL | INTRAVENOUS | Status: DC | PRN
  Filled 2019-12-06: qty 10

## 2019-12-06 MED ORDER — SODIUM CHLORIDE 0.9 % IV SOLN: 200.0000 mg | Freq: Once | INTRAVENOUS | Status: DC

## 2019-12-06 MED ORDER — SODIUM CHLORIDE 0.9% FLUSH
10.0000 mL | Freq: Once | INTRAVENOUS | Status: AC
  Administered 2019-12-06: 10 mL
  Filled 2019-12-06: qty 10

## 2019-12-06 MED ORDER — SODIUM CHLORIDE 0.9% FLUSH
10.0000 mL | INTRAVENOUS | Status: DC | PRN
  Administered 2019-12-06: 10 mL via INTRAVENOUS
  Filled 2019-12-06: qty 10

## 2019-12-06 MED ORDER — HEPARIN SOD (PORK) LOCK FLUSH 100 UNIT/ML IV SOLN
500.0000 [IU] | Freq: Once | INTRAVENOUS | Status: AC
  Administered 2019-12-06: 500 [IU] via INTRAVENOUS
  Filled 2019-12-06: qty 5

## 2019-12-06 MED ORDER — SODIUM CHLORIDE 0.9 % IV SOLN
200.0000 mg | Freq: Once | INTRAVENOUS | Status: AC
  Administered 2019-12-06: 200 mg via INTRAVENOUS
  Filled 2019-12-06: qty 200

## 2019-12-06 NOTE — Patient Instructions (Signed)

## 2019-12-06 NOTE — Patient Instructions (Addendum)
Iron Sucrose injection What is this medicine? IRON SUCROSE (AHY ern SOO krohs) is an iron complex. Iron is used to make healthy red blood cells, which carry oxygen and nutrients throughout the body. This medicine is used to treat iron deficiency anemia in people with chronic kidney disease. This medicine may be used for other purposes; ask your health care provider or pharmacist if you have questions. COMMON BRAND NAME(S): Venofer What should I tell my health care provider before I take this medicine? They need to know if you have any of these conditions:  anemia not caused by low iron levels  heart disease  high levels of iron in the blood  kidney disease  liver disease  an unusual or allergic reaction to iron, other medicines, foods, dyes, or preservatives  pregnant or trying to get pregnant  breast-feeding How should I use this medicine? This medicine is for infusion into a vein. It is given by a health care professional in a hospital or clinic setting. Talk to your pediatrician regarding the use of this medicine in children. While this drug may be prescribed for children as young as 2 years for selected conditions, precautions do apply. Overdosage: If you think you have taken too much of this medicine contact a poison control center or emergency room at once. NOTE: This medicine is only for you. Do not share this medicine with others. What if I miss a dose? It is important not to miss your dose. Call your doctor or health care professional if you are unable to keep an appointment. What may interact with this medicine? Do not take this medicine with any of the following medications:  deferoxamine  dimercaprol  other iron products This medicine may also interact with the following medications:  chloramphenicol  deferasirox This list may not describe all possible interactions. Give your health care provider a list of all the medicines, herbs, non-prescription drugs, or  dietary supplements you use. Also tell them if you smoke, drink alcohol, or use illegal drugs. Some items may interact with your medicine. What should I watch for while using this medicine? Visit your doctor or healthcare professional regularly. Tell your doctor or healthcare professional if your symptoms do not start to get better or if they get worse. You may need blood work done while you are taking this medicine. You may need to follow a special diet. Talk to your doctor. Foods that contain iron include: whole grains/cereals, dried fruits, beans, or peas, leafy green vegetables, and organ meats (liver, kidney). What side effects may I notice from receiving this medicine? Side effects that you should report to your doctor or health care professional as soon as possible:  allergic reactions like skin rash, itching or hives, swelling of the face, lips, or tongue  breathing problems  changes in blood pressure  cough  fast, irregular heartbeat  feeling faint or lightheaded, falls  fever or chills  flushing, sweating, or hot feelings  joint or muscle aches/pains  seizures  swelling of the ankles or feet  unusually weak or tired Side effects that usually do not require medical attention (report to your doctor or health care professional if they continue or are bothersome):  diarrhea  feeling achy  headache  irritation at site where injected  nausea, vomiting  stomach upset  tiredness This list may not describe all possible side effects. Call your doctor for medical advice about side effects. You may report side effects to FDA at 1-800-FDA-1088. Where should I keep   my medicine? This drug is given in a hospital or clinic and will not be stored at home. NOTE: This sheet is a summary. It may not cover all possible information. If you have questions about this medicine, talk to your doctor, pharmacist, or health care provider.  2020 Elsevier/Gold Standard (2011-02-10  17:14:35) https://www.redcrossblood.org/donate-blood/blood-donation-process/what-happens-to-donated-blood/blood-transfusions/types-of-blood-transfusions.html"> https://www.hematology.org/education/patients/blood-basics/blood-safety-and-matching"> https://www.nhlbi.nih.gov/health-topics/blood-transfusion">  Blood Transfusion, Adult A blood transfusion is a procedure in which you receive blood or a type of blood cell (blood component) through an IV. You may need a blood transfusion when your blood level is low. This may result from a bleeding disorder, illness, injury, or surgery. The blood may come from a donor. You may also be able to donate blood for yourself (autologous blood donation) before a planned surgery. The blood given in a transfusion is made up of different blood components. You may receive:  Red blood cells. These carry oxygen to the cells in the body.  Platelets. These help your blood to clot.  Plasma. This is the liquid part of your blood. It carries proteins and other substances throughout the body.  White blood cells. These help you fight infections. If you have hemophilia or another clotting disorder, you may also receive other types of blood products. Tell a health care provider about:  Any blood disorders you have.  Any previous reactions you have had during a blood transfusion.  Any allergies you have.  All medicines you are taking, including vitamins, herbs, eye drops, creams, and over-the-counter medicines.  Any surgeries you have had.  Any medical conditions you have, including any recent fever or cold symptoms.  Whether you are pregnant or may be pregnant. What are the risks? Generally, this is a safe procedure. However, problems may occur.  The most common problems include: ? A mild allergic reaction, such as red, swollen areas of skin (hives) and itching. ? Fever or chills. This may be the body's response to new blood cells received. This may occur during  or up to 4 hours after the transfusion.  More serious problems may include: ? Transfusion-associated circulatory overload (TACO), or too much fluid in the lungs. This may cause breathing problems. ? A serious allergic reaction, such as difficulty breathing or swelling around the face and lips. ? Transfusion-related acute lung injury (TRALI), which causes breathing difficulty and low oxygen in the blood. This can occur within hours of the transfusion or several days later. ? Iron overload. This can happen after receiving many blood transfusions over a period of time. ? Infection or virus being transmitted. This is rare because donated blood is carefully tested before it is given. ? Hemolytic transfusion reaction. This is rare. It happens when your body's defense system (immune system)tries to attack the new blood cells. Symptoms may include fever, chills, nausea, low blood pressure, and low back or chest pain. ? Transfusion-associated graft-versus-host disease (TAGVHD). This is rare. It happens when donated cells attack your body's healthy tissues. What happens before the procedure? Medicines Ask your health care provider about:  Changing or stopping your regular medicines. This is especially important if you are taking diabetes medicines or blood thinners.  Taking medicines such as aspirin and ibuprofen. These medicines can thin your blood. Do not take these medicines unless your health care provider tells you to take them.  Taking over-the-counter medicines, vitamins, herbs, and supplements. General instructions  Follow instructions from your health care provider about eating and drinking restrictions.  You will have a blood test to determine your blood type. This   is necessary to know what kind of blood your body will accept and to match it to the donor blood.  If you are going to have a planned surgery, you may be able to do an autologous blood donation. This may be done in case you need  to have a transfusion.  You will have your temperature, blood pressure, and pulse monitored before the transfusion.  If you have had an allergic reaction to a transfusion in the past, you may be given medicine to help prevent a reaction. This medicine may be given to you by mouth (orally) or through an IV.  Set aside time for the blood transfusion. This procedure generally takes 1-4 hours to complete. What happens during the procedure?   An IV will be inserted into one of your veins.  The bag of donated blood will be attached to your IV. The blood will then enter through your vein.  Your temperature, blood pressure, and pulse will be monitored regularly during the transfusion. This monitoring is done to detect early signs of a transfusion reaction.  Tell your nurse right away if you have any of these symptoms during the transfusion: ? Shortness of breath or trouble breathing. ? Chest or back pain. ? Fever or chills. ? Hives or itching.  If you have any signs or symptoms of a reaction, your transfusion will be stopped and you may be given medicine.  When the transfusion is complete, your IV will be removed.  Pressure may be applied to the IV site for a few minutes.  A bandage (dressing)will be applied. The procedure may vary among health care providers and hospitals. What happens after the procedure?  Your temperature, blood pressure, pulse, breathing rate, and blood oxygen level will be monitored until you leave the hospital or clinic.  Your blood may be tested to see how you are responding to the transfusion.  You may be warmed with fluids or blankets to maintain a normal body temperature.  If you receive your blood transfusion in an outpatient setting, you will be told whom to contact to report any reactions. Where to find more information For more information on blood transfusions, visit the American Red Cross: redcross.org Summary  A blood transfusion is a procedure in  which you receive blood or a type of blood cell (blood component) through an IV.  The blood you receive may come from a donor or be donated by yourself (autologous blood donation) before a planned surgery.  The blood given in a transfusion is made up of different blood components. You may receive red blood cells, platelets, plasma, or white blood cells depending on the condition treated.  Your temperature, blood pressure, and pulse will be monitored before, during, and after the transfusion.  After the transfusion, your blood may be tested to see how your body has responded. This information is not intended to replace advice given to you by your health care provider. Make sure you discuss any questions you have with your health care provider. Document Revised: 10/25/2018 Document Reviewed: 10/25/2018 Elsevier Patient Education  2020 Elsevier Inc.  

## 2019-12-06 NOTE — Telephone Encounter (Signed)
Critical Value: Hgb 6.9 Dr. Burr Medico notified

## 2019-12-06 NOTE — Progress Notes (Signed)
Per Dr. Burr Medico, Bethany [name] is to stop her plavix.  I reviewed this with Bethany [name].  I told her to continue with the aspirin.  She verbalized understanding.

## 2019-12-07 ENCOUNTER — Encounter: Payer: Self-pay | Admitting: Hematology

## 2019-12-07 NOTE — Progress Notes (Signed)
[place]   [phone] [phone]   Clinic Follow up Note   Patient Care Team: Wenda Low, MD as PCP - General (Internal Medicine) O'Neal, Cassie Freer, MD as PCP - Cardiology (Cardiology) Truitt Merle, MD as Consulting Physician (Hematology) Angelia Mould, MD as Consulting Physician (Vascular Surgery) Koleen Distance, MD as Referring Physician (Gastroenterology)  Date of Service:  12/06/2019  CHIEF COMPLAINT: worsening anemia    CURRENT THERAPY:  iv Feraheme as needed (if ferritin <100). Changed to IV Venofer on 01/02/19  INTERVAL HISTORY:  Bethany [name] is here for lab and IV iron. I was informed that her hemoglobin was critically low, and I saw her in the infusion.  She unfortunately had episode of ischemia stroke on November 19, 2019 and was hospitalized for 3 days and was discharged home. She has residual dysphasia, but denies significant weakness or numbness.  She was put on baby aspirin and Plavix since her stroke.  She has noticed black stool for the past 5-8 days, she had black stool once every a few days.  She also reports intermittent epigastric pain, nausea, and a few episodes of vomiting, no bloody vomits. She has noticed worsening fatigue, no chest pain or other new complaint.  Review of system otherwise negative  MEDICAL HISTORY:  Past Medical History:  Diagnosis Date  . Acute renal failure ([place])   . Arthritis   . Diabetes mellitus without complication ([place])    type II   . Hypercalcemia   . Hyperlipidemia   . Hypertension   . Renal disorder   . Single kidney   . Thrombosis   . Tobacco abuse     SURGICAL HISTORY: Past Surgical History:  Procedure Laterality Date  . ABDOMINAL HYSTERECTOMY    . blood clots removed from descending aorta     . CHOLECYSTECTOMY    . COLONOSCOPY    . COLONOSCOPY WITH PROPOFOL N/A 04/17/2017   Procedure: COLONOSCOPY WITH PROPOFOL;  Surgeon: Wilford Corner, MD;  Location: [facility];   Service: Endoscopy;  Laterality: N/A;  . ESOPHAGOGASTRODUODENOSCOPY (EGD) WITH PROPOFOL N/A 04/17/2017   Procedure: ESOPHAGOGASTRODUODENOSCOPY (EGD) WITH PROPOFOL;  Surgeon: Wilford Corner, MD;  [facility];  Service: Endoscopy;  Laterality: N/A;  . IR IMAGING GUIDED PORT INSERTION  05/01/2019  . NEPHRECTOMY RECIPIENT      I have reviewed the social history and family history with the patient and they are unchanged from previous note.  ALLERGIES:  is allergic to contrast media [iodinated diagnostic agents].  MEDICATIONS:  Current Outpatient Medications  Medication Sig Dispense Refill  . allopurinol (ZYLOPRIM) 300 MG tablet Take 1 tablet (300 mg total) by mouth daily. 30 tablet 0  . amLODipine (NORVASC) 10 MG tablet Take 10 mg by mouth daily.    . APPLE CIDER VINEGAR PO Take 1 tablet by mouth daily.    Marland Kitchen aspirin EC 81 MG EC tablet Take 1 tablet (81 mg total) by mouth daily. Swallow whole. 30 tablet 11  . atorvastatin (LIPITOR) 80 MG tablet Take 1 tablet (80 mg total) by mouth daily. 30 tablet 11  . baclofen (LIORESAL) 10 MG tablet Take 10 mg by mouth 3 (three) times daily as needed for muscle spasms.    . buprenorphine (BUTRANS) 20 MCG/HR PTWK Place 1 patch onto the skin once a week.     . clopidogrel (PLAVIX) 75 MG tablet Take 1 tablet (75 mg total) by mouth daily. 30 tablet 2  . Dulaglutide (TRULICITY) 1.5 AV/4.0JW SOPN  Inject 1.5 mg into the skin once a week. Mondays    . ezetimibe (ZETIA) 10 MG tablet Take 10 mg by mouth daily.    . feeding supplement, ENSURE ENLIVE, (ENSURE ENLIVE) LIQD Take 237 mLs by mouth 2 (two) times daily between meals. 237 mL 12  . furosemide (LASIX) 40 MG tablet Take 1 tablet (40 mg total) by mouth daily as needed. 45 tablet 3  . gabapentin (NEURONTIN) 600 MG tablet Take 600 mg by mouth 4 (four) times daily.     . Insulin Glargine (TOUJEO SOLOSTAR) 300 UNIT/ML SOPN Inject 34 Units into the skin at bedtime.     . insulin lispro (HUMALOG KWIKPEN)  100 UNIT/ML KiwkPen Inject 24 Units into the skin daily with supper.     . Ipratropium-Albuterol (COMBIVENT) 20-100 MCG/ACT AERS respimat Inhale 1 puff into the lungs every 6 (six) hours as needed for wheezing or shortness of breath. 1 Inhaler 0  . levothyroxine (SYNTHROID) 75 MCG tablet Take 75 mcg by mouth daily.    Marland Kitchen lidocaine-prilocaine (EMLA) cream Apply 1 application topically as needed. 30 g 1  . losartan (COZAAR) 25 MG tablet Take 1 tablet (25 mg total) by mouth daily. 30 tablet 11  . metoprolol (LOPRESSOR) 50 MG tablet Take 50 mg by mouth 2 (two) times daily.     Marland Kitchen oxyCODONE-acetaminophen (PERCOCET) 10-325 MG tablet Take 1 tablet by mouth every 6 (six) hours as needed for pain.    . pantoprazole (PROTONIX) 40 MG tablet Take 40 mg by mouth daily.    . pregabalin (LYRICA) 25 MG capsule Take 25 mg by mouth 2 (two) times daily.     Marland Kitchen senna (SENOKOT) 8.6 MG TABS tablet Take 1 tablet by mouth.    . Vitamin D, Ergocalciferol, (DRISDOL) 1.25 MG (50000 UNIT) CAPS capsule Take 50,000 Units by mouth every 7 (seven) days.    Marland Kitchen XTAMPZA ER 27 MG C12A Take 1 capsule by mouth 2 (two) times daily.     No current facility-administered medications for this visit.    PHYSICAL EXAMINATION  Due to Bethany [name] we will limit examination to appearance. GENERAL:alert, no distress and comfortable SKIN: skin color normal, no rashes or significant lesions EYES: normal, Conjunctiva are pink and non-injected, sclera clear  NEURO: alert & oriented x 3 with slurry speech    LABORATORY DATA:  I have reviewed the data as listed CBC Latest Ref Rng & Units 12/06/2019 11/22/2019 11/20/2019  WBC 4.0 - 10.5 K/uL 13.6(H) 5.5 7.0  Hemoglobin 12.0 - 15.0 g/dL 6.9(LL) 10.1(L) 9.9(L)  Hematocrit 36 - 46 % 23.9(L) 34.8(L) 33.4(L)  Platelets 150 - 400 K/uL 242 196 208     CMP Latest Ref Rng & Units 11/22/2019 11/20/2019 11/20/2019  Glucose 70 - 99 mg/dL 169(H) 131(H) -  BUN 8 - 23 mg/dL 12 16 -  Creatinine 0.44 - 1.00 mg/dL 1.03(H)  1.01(H) 1.04(H)  Sodium 135 - 145 mmol/L 136 139 -  Potassium 3.5 - 5.1 mmol/L 4.2 4.2 -  Chloride 98 - 111 mmol/L 103 103 -  CO2 22 - 32 mmol/L 25 26 -  Calcium 8.9 - 10.3 mg/dL 9.8 9.9 -  Total Protein 6.5 - 8.1 g/dL - - -  Total Bilirubin 0.3 - 1.2 mg/dL - - -  Alkaline Phos 38 - 126 U/L - - -  AST 15 - 41 U/L - - -  ALT 0 - 44 U/L - - -      RADIOGRAPHIC STUDIES: I have personally reviewed  the radiological images as listed and agreed with the findings in the report. No results found.   ASSESSMENT & PLAN:  JESSYE IMHOFF is a 68 y.o. female with    1. 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 She has required blood transfusions in the past in 2013 and in 2018 and frequent hospital admission for anemia. -She has intermittent epistaxis, but no skin or mucosa telangiectasia, no significant family history of bleeding disorder, she does not meet the diagnosis criteria for hereditary hemorrhagic telangiectasia (HHT). -Her last colonoscopy/endoscopy was 04/2017.  -She is not on oral iron, due to the lack of benefit. -Currently being treated withIV Iron to prevent significant anemiawith goal of Ferritin 100-200 range. She was switched to IV Venofer on 8/19/20due to her insurance coverage.  -She has been receiving IV Venofer monthly -Her anemia is much worse today with a hemoglobin 6.9, she reports melena for the past week.  This is probably related to her aspirin and Plavix which was started after her recent stroke. -I spoke with her neurologist, he agrees to hold Plavix, will continue aspirin -We will give 1 dose of Venofer today, and 2 units of blood transfusion -Continue Venofer 400mg  weekly for the next 3 weeks -lab every 2 weeks -f/u in 4 weeks   2. Chronic GI Bleeding, intermittent epistaxis -Has intestinal AVM history  -Last  colonoscopy/EGD by Dr. Michail Sermon on 04/17/17 showed gastritis, diverticulosis and internal hemorrhoids, all benign with no obvious signs of bleeding at that time. -She does not meet the diagnosis criteria for hereditary hemorrhagic telangiectasia (HHT) -due to her recent GI bleeding, I recommend her to f/u with GI. I reached out to her GI Dr. Rodena [facility]ety at the [facility], waiting a call back.  3. H/o of arterial thrombosis -S/p unilateral nephrectomy due to damage from thrombosis -Managed by vascular physician, Dr. Doren Custard  -patient does not currently receive therapeutic anticoagulation due to history of severe GI bleeding. -Previous work-up for lupus anticoagulantand antiphospholipid syndromewerenegative, normal protein S and C level.  4. Smoking Cessation, Lung Nodules  -Currently smokes 1.5packs over 2 days currently. She has been smoking for 40 years. Janeal Holmes repeatedly encouraged her to continue to try to quit completely, she is willing to try.  -Her CT Chest from 07/12/19 shows Stable 7 mm nodule in left lower lobe. Also shows New 4 mm nodule is noted in left upper lobe and 12 mm sub solid density is noted in left lung apex. I personally reviewed with Patient today. She plans to repeat in 3 months, which has been ordered by her PCP. I agree with the plan.  -She understands she needs to quit smoking completely.  -Cancer Screening: Her 07/12/19 CT chest also showed 42mm mass right breast mass which Pt notes she has had for years. Previous diagnostic mammogram showed likely benign, and she previously had biopsy in 2014 which showed fibroadenoma. I highly recommend she have annual mammogram for better screening.   5. COPD, HTN, CKD stage III -Only has 1 kidney.  -f/u with PCP  6. DM with Peripheral Neuropathy  -She notes her neuropathy has gotten worse and has been experienced upper left leg pain from this.  -She is currently on 600mg  Gabapentin 4 times a day.  7. Chronic  Back Pain  -She is being [facility] by [facility]  -She is  currently doing PT, will continue.   8.  Ischemic stroke 11/2019 -She was hospitalized for 3 days, with residual dysphasia  -she is scheduled for neuro rehab and follow-up with Dr. Leonie Man -Continue with aspirin 81 mg daily.  Due to her recent GI bleeding, will hold on Plavix.   PLAN: -IV Venofer and 2u blood today, then increase to 400mg  weekly for next 2 weeks -lab in 2 and 4 weeks -f/u and iv iron in 4 weeks -I will call her sister and update her     No problem-specific Assessment & Plan notes found for this encounter.   Orders Placed This Encounter  Procedures  . Informed Consent Details: Physician/Practitioner Attestation; Transcribe to consent form and obtain patient signature    Order Specific Question:   Physician/Practitioner attestation of informed consent for blood and or blood product transfusion    Answer:   I, the physician/practitioner, attest that I have discussed with the patient the benefits, risks, side effects, alternatives, likelihood of achieving goals and potential problems during recovery for the procedure that I have provided informed consent.    Order Specific Question:   Product(s)    Answer:   All Product(s)  . Informed Consent Details: Physician/Practitioner Attestation; Transcribe to consent form and obtain patient signature    Order Specific Question:   Physician/Practitioner attestation of informed consent for blood and or blood product transfusion    Answer:   I, the physician/practitioner, attest that I have discussed with the patient the benefits, risks, side effects, alternatives, likelihood of achieving goals and potential problems during recovery for the procedure that I have provided informed consent.    Order Specific Question:   Product(s)    Answer:   All Product(s)   All questions were answered. The patient knows to call the clinic with any problems, questions or  concerns. No barriers to learning was detected. The total time spent in the appointment was 30 minutes.     Truitt Merle, MD 12/07/2019

## 2019-12-08 LAB — TYPE AND SCREEN
ABO/RH(D): B POS
Antibody Screen: NEGATIVE
Unit division: 0
Unit division: 0

## 2019-12-08 LAB — BPAM RBC
Blood Product Expiration Date: 202108192359
Blood Product Expiration Date: 202108222359
ISSUE DATE / TIME: 202107231137
ISSUE DATE / TIME: 202107231137
Unit Type and Rh: 7300
Unit Type and Rh: 7300

## 2019-12-09 ENCOUNTER — Ambulatory Visit: Payer: Medicare Other | Admitting: Occupational Therapy

## 2019-12-09 ENCOUNTER — Encounter: Payer: Self-pay | Admitting: Speech Pathology

## 2019-12-09 ENCOUNTER — Ambulatory Visit: Payer: Medicare Other | Admitting: Speech Pathology

## 2019-12-09 ENCOUNTER — Encounter: Payer: Self-pay | Admitting: Occupational Therapy

## 2019-12-09 ENCOUNTER — Telehealth: Payer: Self-pay | Admitting: Hematology

## 2019-12-09 ENCOUNTER — Other Ambulatory Visit: Payer: Self-pay

## 2019-12-09 ENCOUNTER — Ambulatory Visit: Payer: Medicare Other | Attending: Family Medicine

## 2019-12-09 VITALS — BP 129/65 | HR 58

## 2019-12-09 DIAGNOSIS — R2681 Unsteadiness on feet: Secondary | ICD-10-CM | POA: Insufficient documentation

## 2019-12-09 DIAGNOSIS — R2689 Other abnormalities of gait and mobility: Secondary | ICD-10-CM | POA: Diagnosis not present

## 2019-12-09 DIAGNOSIS — R4701 Aphasia: Secondary | ICD-10-CM | POA: Diagnosis present

## 2019-12-09 DIAGNOSIS — R209 Unspecified disturbances of skin sensation: Secondary | ICD-10-CM | POA: Insufficient documentation

## 2019-12-09 DIAGNOSIS — R208 Other disturbances of skin sensation: Secondary | ICD-10-CM

## 2019-12-09 NOTE — Therapy (Signed)
[facility] [address] [place], [place], [address] Phone: [phone]   Fax:  [phone]  Occupational Therapy Evaluation  Patient Details  Name: Bethany [name] MRN: [id number] Date of Birth: [birth year] Referring Provider (OT): Dr. Leonie Man   Encounter Date: 12/09/2019   OT End of Session - 12/09/19 0935    Visit Number 1    Number of Visits 1    Date for OT Re-Evaluation --   eval only   Authorization Type [facility] 2021    OT Start Time 1607   arrived late   OT Stop Time 0922    OT Time Calculation (min) 28 min    Activity Tolerance Patient tolerated treatment well    Behavior During Therapy [facility] for tasks assessed/performed           Past Medical History:  Diagnosis Date  . Acute renal failure ([place])   . Arthritis   . Diabetes mellitus without complication ([place])    type II   . Hypercalcemia   . Hyperlipidemia   . Hypertension   . Renal disorder   . Single kidney   . Thrombosis   . Tobacco abuse     Past Surgical History:  Procedure Laterality Date  . ABDOMINAL HYSTERECTOMY    . blood clots removed from descending aorta     . CHOLECYSTECTOMY    . COLONOSCOPY    . COLONOSCOPY WITH PROPOFOL N/A 04/17/2017   Procedure: COLONOSCOPY WITH PROPOFOL;  Surgeon: Wilford Corner, MD;  Location: [facility];  Service: Endoscopy;  Laterality: N/A;  . ESOPHAGOGASTRODUODENOSCOPY (EGD) WITH PROPOFOL N/A 04/17/2017   Procedure: ESOPHAGOGASTRODUODENOSCOPY (EGD) WITH PROPOFOL;  Surgeon: Wilford Corner, MD;  [facility];  Service: Endoscopy;  Laterality: N/A;  . IR IMAGING GUIDED PORT INSERTION  05/01/2019  . NEPHRECTOMY RECIPIENT      There were no vitals filed for this visit.   Subjective Assessment - 12/09/19 0858    Subjective  Pt reports no change in ADLs/IADLs since eval.  Only thing that is different now is speech.    Pertinent History CVA.  PMH:  peripheral neuropathy, only has 1 kidney (CKD  stage III), COPD, DM, chronic back pain, AVM, anemia, HTN    Patient Stated Goals none for OT    Currently in Pain? No/denies   none currently:  already taken pain meds for chronic back pain and LE pain due to neuropathy and stenosis per pt (OT will not address due to location/nature)            [facility] OT Assessment - 12/09/19 0001      Assessment   Medical Diagnosis CVA     Referring Provider (OT) Dr. 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[place], walking outdoor (activities downtown), travel      ADL   ADL comments Pt reports BADLs independently      IADL  Shopping Takes care of all shopping needs independently    Light Housekeeping --   independent   Meal Prep Plans, prepares and serves adequate meals independently    Programmer, applications own vehicle    Medication Management Is responsible for taking medication in correct dosages at correct time    Physiological scientist financial matters independently (budgets, writes checks, pays rent, bills goes to bank), collects and keeps track of income      Mobility   Mobility Status Independent    Mobility Status Comments hx of neuropathy affecting walking prior to CVA, had just finished PT through pain clinic prior to CVA, reports initial RLE weakness resolved and that she feels that she is at baseline      Written Expression   Dominant Hand Right    Handwriting --   occasional difficulty/numbness due to neuropathy     Vision - History   Baseline Vision Wears glasses only for reading     Additional Comments WFL per pt report, no change from CVA      Cognition   Overall Cognitive Status Within Functional Limits for tasks assessed   pt reports aphasia, but no difficulty with ADLs/IADLs     Sensation   Additional Comments Pt reports neuropathy in BLE (R>L) and RUE (prior to CVA)--no change since CVA per pt      Coordination   9 Hole Peg Test Right;Left    Right 9 Hole Peg Test 27.47    Left 9 Hole Peg Test 25.25      ROM / Strength   AROM / PROM / Strength AROM;Strength      AROM   Overall AROM  Within functional limits for tasks performed    Overall AROM Comments BUEs grossly WNL      Strength   Overall Strength Within functional limits for tasks performed    Overall Strength Comments BUE proximal strength grossly 5/5      Hand Function   Right Hand Grip (lbs) 68.5lbs    Left Hand Grip (lbs) 59.5lbs                                       Plan - 12/09/19 0936    Clinical Impression Statement Pt is a 69 y.o. female s/p CVA 11/19/19.  Pt with PMH that includes:  peripheral neuropathy, only has 1 kidney (CKD stage III), COPD, DM, chronic back pain, AVM, anemia, HTN.  Pt reports that she initially had RLE weakness but that this is now resolved.  Pt continues to report aphasia with speech therapy evaluation today.  Pt reports that she has returned to all previous ADLs and IADLs independently at prior functional level.  Pt does have sensation deficits/difficulty walking due to prior neuropathy, but denies changes and has had PT in the past for this.  Therefore, no further skilled occupational therapy needed at this time.    OT Occupational Profile and History Detailed Assessment- Review of Records and additional review of physical, cognitive, psychosocial history related to current functional performance    Occupational performance deficits (Please refer to evaluation for details): Leisure    Body Structure / Function / Physical Skills  Sensation;Mobility    Clinical Decision Making Limited treatment options, no task modification necessary    Comorbidities Affecting Occupational Performance: Presence of comorbidities impacting occupational performance    Comorbidities impacting occupational performance description: neuropathy, chronic back  pain    Modification or Assistance to Complete Evaluation  Min-Moderate modification of tasks or assist with assess necessary to complete eval    OT Frequency One time visit   evaluation only   OT Treatment/Interventions Self-care/ADL training;Patient/family education    Plan no futher occupational therapy needed at this time    Recommended Other Services pt is scheduled for Speech Therapy and Physical Therapy evaluations    Consulted and Agree with Plan of Care Patient           Patient will benefit from skilled therapeutic intervention in order to improve the following deficits and impairments:   Body Structure / Function / Physical Skills: Sensation, Mobility       Visit Diagnosis: Other disturbances of skin sensation  Other abnormalities of gait and mobility    Problem List Patient Active Problem List   Diagnosis Date Noted  . CVA (cerebral vascular accident) ([place]) 11/19/2019  . Acute on chronic diastolic CHF (congestive heart failure) ([place])   . Acute respiratory failure with hypoxia ([place]) 03/27/2019  . Fever 03/27/2019  . Shortness of breath 03/27/2019  . Hypothyroidism 03/27/2019  . Chronic GI bleeding 03/27/2019  . Pleural effusion on right 03/27/2019  . Acute renal failure superimposed on stage 3b chronic kidney disease ([place]) 03/27/2019  . Essential hypertension 03/27/2019  . GERD (gastroesophageal reflux disease) 03/27/2019  . Acute respiratory failure ([place]) 03/27/2019  . Breast mass, right 06/08/2017  . Liver mass 06/08/2017  . Chronic kidney disease (CKD), stage III (moderate) 03/13/2017  . Chronic headaches 03/13/2017  . Depression 03/13/2017  . Diabetes  mellitus type 2 with neurological manifestations ([place]) 03/13/2017  . History of thrombosis 03/13/2017  . Hyperlipemia, mixed 03/13/2017  . Community acquired pneumonia 03/13/2017  . COPD exacerbation ([place]) 03/13/2017  . Pneumonia 03/13/2017  . Iron deficiency anemia secondary to blood loss (chronic) 03/07/2017  . GI AVM (gastrointestinal arteriovenous vascular malformation) 03/07/2017  . History of colonic polyps 10/05/2016    [facility] 12/09/2019, 9:45 AM  [facility] [address] [place], [place], [address] Phone: [phone]   Fax:  [phone]  Name: Bethany [name] MRN: [id number] Date of Birth: 04/22/1951   Vianne Bulls, OTR/L [facility] [address] [place], [place]  [address] [phone] phone [phone] 12/09/19 9:46 AM

## 2019-12-09 NOTE — Patient Instructions (Addendum)
  It's always OK to let people/family know "The doctor says I can only talk for 10 minutes" (or however long you want to talk)  The same with visitors. Feel free to say "the doctor says I can only visit for 20 minutes"  Trying to process and keep up with conversation may drain your energy and make word finding worse  Do the best you can to avoid stressful situations/people  Your body shows no sign of a stroke - it is invisible, so other people may not understand that your brain is healing and you can't keep up with everything and do everything as quickly while you are healing  Because you look good, others may forget you had a stroke or expect you to do what you would have done prior to the stroke. You will get more fatigued as your are healing and building endurance  Headaches/ nausea may be a sign that you need to take a rest. Take breaks in between chores at home

## 2019-12-09 NOTE — Telephone Encounter (Signed)
No 7/23 los

## 2019-12-09 NOTE — Therapy (Signed)
[facility] [address] [place], [place], [address] Phone: [phone]   Fax:  [phone]  Physical Therapy Evaluation  Patient Details  Name: ADILENY DELON MRN: [id number] Date of Birth: 04/08/1951 Referring Provider (PT): Myrene Buddy, MD followed by Garvin Fila, MD   Encounter Date: 12/09/2019   PT End of Session - 12/09/19 1030    Visit Number 1    Number of Visits 1    Date for PT Re-Evaluation --   eval only   Authorization Type UHC Medicare    PT Start Time 1022   patient finishing up Speech Therapy   PT Stop Time 1100    PT Time Calculation (min) 38 min    Equipment Utilized During Treatment Gait belt    Activity Tolerance Patient tolerated treatment well    Behavior During Therapy WFL for tasks assessed/performed           Past Medical History:  Diagnosis Date  . Acute renal failure ([place])   . Arthritis   . Diabetes mellitus without complication ([place])    type II   . Hypercalcemia   . Hyperlipidemia   . Hypertension   . Renal disorder   . Single kidney   . Thrombosis   . Tobacco abuse     Past Surgical History:  Procedure Laterality Date  . ABDOMINAL HYSTERECTOMY    . blood clots removed from descending aorta     . CHOLECYSTECTOMY    . COLONOSCOPY    . COLONOSCOPY WITH PROPOFOL N/A 04/17/2017   Procedure: COLONOSCOPY WITH PROPOFOL;  Surgeon: Wilford Corner, MD;  Location: [facility];  Service: Endoscopy;  Laterality: N/A;  . ESOPHAGOGASTRODUODENOSCOPY (EGD) WITH PROPOFOL N/A 04/17/2017   Procedure: ESOPHAGOGASTRODUODENOSCOPY (EGD) WITH PROPOFOL;  Surgeon: Wilford Corner, MD;  Location: [facility];  Service: Endoscopy;  Laterality: N/A;  . IR IMAGING GUIDED PORT INSERTION  05/01/2019  . NEPHRECTOMY RECIPIENT      Vitals:   12/09/19 1035  BP: (!) 129/65  Pulse: 58  SpO2: 96%     Subjective Assessment - 12/09/19 1025    Subjective Patient reports had extensive  therapy at [facility] for arthritis/sciatica. Patient reports no change in gait/balance since CVA. Patient reports that as long as she takes her medication, she feels fine. Patient reports no difficulty doing activites around the house. Reports that she does not want to recieve PT services.    Pertinent History chroninc pain due to arthritis, hypertension, hyperlipidemia, hypercalcemia, DM Type 2, Gout    Patient Stated Goals None    Currently in Pain? No/denies              [facility] PT Assessment - 12/09/19 1031      Assessment   Medical Diagnosis CVA    Referring Provider (PT) Myrene Buddy, MD followed by Garvin Fila, MD    Onset Date/Surgical Date 11/19/19    Hand Dominance Right    Prior Therapy PT for Arthritis/[place]      Precautions   Precautions None      Restrictions   Weight Bearing Restrictions No      Balance Screen   Has the patient fallen in the past 6 months No    Has the patient had a decrease in activity level because of a fear of falling?  No    Is the patient reluctant to leave their home because of a fear of falling?  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[place], walking outdoor (activities [place]), travel      Cognition   Overall Cognitive Status Within Functional Limits for tasks assessed      Sensation   Light Touch Impaired by gross assessment    Additional Comments Patient reports neuropathy in BLE      Coordination   Gross Motor Movements are Fluid and Coordinated Yes   for BLE's     ROM / Strength   AROM / PROM / Strength AROM;Strength       AROM   Overall AROM  Within functional limits for tasks performed    Overall AROM Comments for BLE"s      Strength   Overall Strength Within functional limits for tasks performed    Overall Strength Comments for BLE's       Transfers   Transfers Sit to Stand;Stand to Sit    Sit to Stand 6: Modified independent (Device/Increase time)    Five time sit to stand comments  14.52 seconds w/ UE support    Stand to Sit 6: Modified independent (Device/Increase time)      Ambulation/Gait   Ambulation/Gait Yes    Ambulation/Gait Assistance 6: Modified independent (Device/Increase time)    Ambulation Distance (Feet) 100 Feet    Assistive device None    Gait Pattern Step-through pattern   mild steppage gait due to neuropathy   Ambulation Surface Level;Indoor    Gait velocity 12.45 secs = 2.63 ft/sec (without AD)                                  PT Education - 12/09/19 1049    Education Details Educated on evaluation findings, no need for PT services at this time    Person(s) Educated Patient    Methods Explanation                      Plan - 12/09/19 1051    Clinical Impression Statement Patient is a 69 y.o. female that was referred to Neuro OPPT services due to L MCA CVA. Patient's PMH is significant for the following: chroninc pain due to arthritis, hypertension, hyperlipidemia, hypercalcemia, DM Type 2, Gout. Upon evaluation and speaking with the patient, patient has no changes in functional mobility, gait, or balance due to CVA. Patient presents with abnormal gait, but reports this is due to neuropathy. [facility] for LBP/[place] and Neuropathy Patient reports that she does not need PT services at this time, with PT agreeing.    Personal Factors and Comorbidities Comorbidity 3+    Comorbidities chroninc pain due to arthritis, hypertension, hyperlipidemia, hypercalcemia, DM Type 2, Gout    Examination-Activity Limitations  Sleep    Examination-Participation Restrictions Community Activity;Yard Work    Stability/Clinical Decision Making Stable/Uncomplicated    Designer, jewellery Low    Recommended Other Services Speech Therapy for [place] and Agree with Plan of Care  Patient           Patient will benefit from skilled therapeutic intervention in order to improve the following deficits and impairments:  Abnormal gait, Impaired sensation, Pain, Decreased strength  Visit Diagnosis: Other abnormalities of gait and mobility  Unsteadiness on feet     Problem List Patient Active Problem List   Diagnosis Date Noted  . CVA (cerebral vascular accident) ([place]) 11/19/2019  . Acute on chronic diastolic CHF (congestive heart failure) ([place])   . Acute respiratory failure with hypoxia ([place]) 03/27/2019  . Fever 03/27/2019  . Shortness of breath 03/27/2019  . Hypothyroidism 03/27/2019  . Chronic GI bleeding 03/27/2019  . Pleural effusion on right 03/27/2019  . Acute renal failure superimposed on stage 3b chronic kidney disease ([place]) 03/27/2019  . Essential hypertension 03/27/2019  . GERD (gastroesophageal reflux disease) 03/27/2019  . Acute respiratory failure ([place]) 03/27/2019  . Breast mass, right 06/08/2017  . Liver mass 06/08/2017  . Chronic kidney disease (CKD), stage III (moderate) 03/13/2017  . Chronic headaches 03/13/2017  . Depression 03/13/2017  . Diabetes mellitus type 2 with neurological manifestations ([place]) 03/13/2017  . History of thrombosis 03/13/2017  . Hyperlipemia, mixed 03/13/2017  . Community acquired pneumonia 03/13/2017  . COPD exacerbation ([place]) 03/13/2017  . Pneumonia 03/13/2017  . Iron deficiency anemia secondary to blood loss (chronic) 03/07/2017  . GI AVM (gastrointestinal arteriovenous vascular malformation) 03/07/2017  . History of colonic polyps 10/05/2016    Jones Bales, PT, DPT 12/09/2019, 12:01 PM  [facility] [address] [place], [place], [address] Phone: [phone]   Fax:  [phone]  Name: THOMAS RHUDE MRN: [id number] Date of Birth: 08/09/1950

## 2019-12-09 NOTE — Therapy (Signed)
[facility] [address] [place], [place], [address] Phone: [phone]   Fax:  [phone]  Speech Language Pathology Evaluation  Patient Details  Name: Bethany [name] MRN: [id number] Date of Birth: Dec 06, 1950 Referring Provider (SLP): Myrene Buddy MD   Encounter Date: 12/09/2019   End of Session - 12/09/19 1052    Visit Number 1    Number of Visits 17    Date for SLP Re-Evaluation 02/03/20    SLP Start Time 0932    SLP Stop Time  1017    SLP Time Calculation (min) 45 min    Activity Tolerance Patient limited by pain;Patient limited by fatigue           Past Medical History:  Diagnosis Date  . Acute renal failure ([place])   . Arthritis   . Diabetes mellitus without complication ([place])    type II   . Hypercalcemia   . Hyperlipidemia   . Hypertension   . Renal disorder   . Single kidney   . Thrombosis   . Tobacco abuse     Past Surgical History:  Procedure Laterality Date  . ABDOMINAL HYSTERECTOMY    . blood clots removed from descending aorta     . CHOLECYSTECTOMY    . COLONOSCOPY    . COLONOSCOPY WITH PROPOFOL N/A 04/17/2017   Procedure: COLONOSCOPY WITH PROPOFOL;  Surgeon: Wilford Corner, MD;  [facility];  Service: Endoscopy;  Laterality: N/A;  . ESOPHAGOGASTRODUODENOSCOPY (EGD) WITH PROPOFOL N/A 04/17/2017   Procedure: ESOPHAGOGASTRODUODENOSCOPY (EGD) WITH PROPOFOL;  Surgeon: Wilford Corner, MD;  [facility];  Service: Endoscopy;  Laterality: N/A;  . IR IMAGING GUIDED PORT INSERTION  05/01/2019  . NEPHRECTOMY RECIPIENT      There were no vitals filed for this visit.   Subjective Assessment - 12/09/19 0930    Subjective "You have the hard job of trying to understand me"              SLP Evaluation [facility] - 12/09/19 0930      SLP Visit Information   SLP Received On 12/09/19    Referring Provider (SLP) Myrene Buddy MD    Onset Date 11/19/19    Medical  Diagnosis Left MCA CVA      Subjective   Patient/Family Stated Goal "To learn how to talk again"      Pain Assessment   Currently in Pain? Yes    Pain Score --   did not rate despite cues   Pain Location Head    Pain Orientation Lateral;Lower   left   Pain Onset Today    Pain Frequency Intermittent    Pain Relieving Factors rest    Effect of Pain on Daily Activities distracting      General Information   HPI Bethany [name] is a 69 y.o. female with history of thrombosis, hypertension, hyperlipidemia, hypercalcemia, diabetes and tobacco abuse admitted with sudden headache followed by significant difficulty getting her words out. MRI brain-acute posterior left MCA territory infarct without mass-effect.  Small focus of petechial hemorrhage with mild cytotoxic edema.  Chronic ischemic microangiopathy.  Hospitalized 11/19/19 to 11/22/19. Prior to CVA, pt independent with IADL's and BADL's. Enjoys NEWS, TV dramas, shopping/decorating and light gardening    Mobility Status walks independently - PT eval today      Prior Functional Status   Cognitive/Linguistic Baseline Within functional limits    Type of Home Apartment     Lives With Alone  Cognition   Overall Cognitive Status Within Functional Limits for tasks assessed      Auditory Comprehension   Overall Auditory Comprehension Impaired    Yes/No Questions Within Functional Limits    Commands Within Functional Limits    Conversation Simple    Interfering Components Processing speed    EffectiveTechniques Extra processing time;Pausing;Repetition;Slowed speech      Reading Comprehension   Reading Status Not tested      Expression   Primary Mode of Expression Verbal      Verbal Expression   Overall Verbal Expression Impaired    Initiation No impairment    Level of Generative/Spontaneous Verbalization Conversation    Repetition Impaired    Level of Impairment Sentence level    Naming Impairment    Responsive 76-100% accurate     Confrontation 75-100% accurate    Convergent 75-100% accurate    Divergent 50-74% accurate    Verbal Errors Phonemic paraphasias;Perseveration;Aware of errors    Pragmatics No impairment    Effective Techniques Phonemic cues      Written Expression   Dominant Hand Right    Written Expression Not tested      Oral Motor/Sensory Function   Overall Oral Motor/Sensory Function Appears within functional limits for tasks assessed      Motor Speech   Overall Motor Speech Appears within functional limits for tasks assessed      Standardized Assessments   Standardized Assessments  Western Aphasia Battery revised    Western Aphasia Battery revised  78.1/100                           SLP Education - 12/09/19 1051    Education Details energy conservation, exacerbations to aphasia,    Person(s) Educated Patient    Methods Explanation;Verbal cues;Handout    Comprehension Verbal cues required;Need further instruction            SLP Short Term Goals - 12/09/19 1216      SLP SHORT TERM GOAL #1   Title Pt will name 10 items in personally relevant categories with rare min A over 2 sessions    Time 4    Period Weeks    Status New      SLP SHORT TERM GOAL #2   Title Pt will utilize compensations for aphasia in structured language tasks with rare min A as needed over 2 sessions    Time 4    Period Weeks    Status New      SLP SHORT TERM GOAL #3   Title Pt will utilize compensations for aphasia in simple conversation re: family and hobbies as needed with occasional min A over 2 sessions    Time 4    Period Weeks    Status New      SLP SHORT TERM GOAL #4   Title Complete reading.writng/text/email assessment as indicated    Time 4    Period Weeks    Status New            SLP Long Term Goals - 12/09/19 1221      SLP LONG TERM GOAL #1   Title Pt will complete structured word finding/sentence generating tasks ( ex: VNeST) with occasional min A 9/10 sentences over  2 sessions    Time 8    Period Weeks    Status New      SLP LONG TERM GOAL #2   Title Pt will utilize compensations  for aphasia to successfully make 2 business calls (MD, hair appt, pharmacy etc) over 2 sessions    Time 8    Period Weeks    Status New      SLP LONG TERM GOAL #3   Title Pt will reports successfull carryover of compensations in moderately complex conversations with family and friends over 3 sessions    Time 8    Period Weeks    Status New          VNeST= Pharmacist, hospital   Plan - 12/09/19 1053    Clinical Impression Statement Wendy Hoback is referred for outpt ST due to aphasia s/p Left MCA CVA 11/19/19. Emmilynn reports that "words get stuck in my mind" and she gets frustrated. [place] states word finding difficulties occur throughout the day and affect communication with her family. Sherelle is aware that as her frustration increases, her word finding gets worse. She also independenlty uses strategy of reduced rate of speech "to help me get it out." Today she presents with mild Broca's type aphasia with phonemic paraphasias, perseverations (mild, not motor planning) and halting speech.  Picture description on WAB revealed telegraphic speech with 7 phonemic paraphasias out of 9 content words. Ex: "Biting, um, uh, boarding bbb uh boating" for [place]. As evaluation progressed, cognitive fatigue evident, and halting, paraphasias and initial sound prolongations increased. Pt reported onset of head ache. BP 144/60, which is normal per pt report. I recommend skilled ST to maximize communication to reduce pt frustration and improve QOL    Speech Therapy Frequency 2x / week    Duration --   8 weeks or 17 visits   Treatment/Interventions Language facilitation;Environmental controls;SLP instruction and feedback;Cueing hierarchy;Compensatory strategies;Functional tasks;Cognitive reorganization;Patient/family education;Multimodal communcation approach;Internal/external  aids;Compensatory techniques    Potential to Achieve Goals Good           Patient will benefit from skilled therapeutic intervention in order to improve the following deficits and impairments:   Aphasia    Problem List Patient Active Problem List   Diagnosis Date Noted  . CVA (cerebral vascular accident) ([place]) 11/19/2019  . Acute on chronic diastolic CHF (congestive heart failure) ([place])   . Acute respiratory failure with hypoxia ([place]) 03/27/2019  . Fever 03/27/2019  . Shortness of breath 03/27/2019  . Hypothyroidism 03/27/2019  . Chronic GI bleeding 03/27/2019  . Pleural effusion on right 03/27/2019  . Acute renal failure superimposed on stage 3b chronic kidney disease ([place]) 03/27/2019  . Essential hypertension 03/27/2019  . GERD (gastroesophageal reflux disease) 03/27/2019  . Acute respiratory failure ([place]) 03/27/2019  . Breast mass, right 06/08/2017  . Liver mass 06/08/2017  . Chronic kidney disease (CKD), stage III (moderate) 03/13/2017  . Chronic headaches 03/13/2017  . Depression 03/13/2017  . Diabetes mellitus type 2 with neurological manifestations ([place]) 03/13/2017  . History of thrombosis 03/13/2017  . Hyperlipemia, mixed 03/13/2017  . Community acquired pneumonia 03/13/2017  . COPD exacerbation ([place]) 03/13/2017  . Pneumonia 03/13/2017  . Iron deficiency anemia secondary to blood loss (chronic) 03/07/2017  . GI AVM (gastrointestinal arteriovenous vascular malformation) 03/07/2017  . History of colonic polyps 10/05/2016    Mancil Pfenning, Annye Rusk MS, CCC-SLP 12/09/2019, 12:24 PM  [facility] [address] [place], [place], [address] Phone: [phone]   Fax:  [phone]  Name: ADIANA SMELCER MRN: [id number] Date of Birth: 08/13/1950

## 2019-12-10 NOTE — Progress Notes (Signed)
Cardiology Office Note:   Date:  12/12/2019  NAME:  Bethany [name]    MRN: [id number] DOB:  1951/04/10   PCP:  Wenda Low, MD  Cardiologist:  Evalina Field, MD  Electrophysiologist:  None   Referring MD: Wenda Low, MD   Chief Complaint  Patient presents with  . Follow-up    History of Present Illness:   Bethany [name] is a 69 y.o. female with a hx of diastolic HF, DM, HTN, CVA, renal artery thrombosis who presents for follow-up. Followed for HFpEF. Recent admission for CVA. No source found. No AC due to IDA that requires transfusion. On ASA/plavix for CVA.  He reports he is doing well since leaving the hospital.  She still struggles with profound anemia.  Her most recent hemoglobin was 6.9.  She is undergoing recurrent iron transfusions.  Her Plavix has been stopped.  She remains on aspirin given her recent stroke.  She reports is not having any major limitations but speech is a big issue for her.  She denies any chest pain or shortness of breath with exercise.  She has no evidence of congestive heart failure on exam.  She denies any lower extremity edema, orthopnea or PND.  Overall she seems to be doing well.  Her most recent LDL cholesterol is 91.  She remains on Lipitor and Zetia.  Her primary care physician will recheck this in a few months.  Blood pressure is well controlled today.  She is without symptoms.  We did discuss that given her anemia she is not a candidate anticoagulation.  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  Given her recurrent anemia that has not been resolved, she is not a candidate for anticoagulation.  I think looking for atrial fibrillation or any other reason to anticoagulate her would be of low yield given her persistent anemia that precludes anticoagulation.  1. HFpEF, 60-65%, Grade 3 DD 03/2019 2. HTN 3. DM -A1c 5.4 -T chol 179, HDL 35, LDL 91, TG 267 4. CKD 5. Renal artery thrombosis  s/p L nephrectomy (2013; negative hypercoag work-up, no AC due to IDA and bleeding) 6. L MCA Aneurysm  7. L MCA CVA 11/2019  Past Medical History: Past Medical History:  Diagnosis Date  . Acute renal failure ([place])   . Arthritis   . Diabetes mellitus without complication ([place])    type II   . Hypercalcemia   . Hyperlipidemia   . Hypertension   . Renal disorder   . Single kidney   . Thrombosis   . Tobacco abuse     Past Surgical History: Past Surgical History:  Procedure Laterality Date  . ABDOMINAL HYSTERECTOMY    . blood clots removed from descending aorta     . CHOLECYSTECTOMY    . COLONOSCOPY    . COLONOSCOPY WITH PROPOFOL N/A 04/17/2017   Procedure: COLONOSCOPY WITH PROPOFOL;  Surgeon: Wilford Corner, MD;  Location: [facility];  Service: Endoscopy;  Laterality: N/A;  . ESOPHAGOGASTRODUODENOSCOPY (EGD) WITH PROPOFOL N/A 04/17/2017   Procedure: ESOPHAGOGASTRODUODENOSCOPY (EGD) WITH PROPOFOL;  Surgeon: Wilford Corner, MD;  Location: [facility];  Service: Endoscopy;  Laterality: N/A;  . IR IMAGING GUIDED PORT INSERTION  05/01/2019  . NEPHRECTOMY RECIPIENT      Current Medications: Current Meds  Medication Sig  . allopurinol (ZYLOPRIM) 300 MG tablet Take 1 tablet (300 mg total) by mouth daily.  Marland Kitchen amLODipine (NORVASC) 10 MG tablet Take 10 mg  by mouth daily.  . APPLE CIDER VINEGAR PO Take 1 tablet by mouth daily.  Marland Kitchen aspirin EC 81 MG EC tablet Take 1 tablet (81 mg total) by mouth daily. Swallow whole.  Marland Kitchen atorvastatin (LIPITOR) 80 MG tablet Take 1 tablet (80 mg total) by mouth daily.  . baclofen (LIORESAL) 10 MG tablet Take 10 mg by mouth 3 (three) times daily as needed for muscle spasms.  . buprenorphine (BUTRANS) 20 MCG/HR PTWK Place 1 patch onto the skin once a week.   . Dulaglutide (TRULICITY) 1.5 NW/2.9FA SOPN Inject 1.5 mg into the skin once a week. Mondays  . ezetimibe (ZETIA) 10 MG tablet Take 10 mg by mouth daily.  . feeding supplement, ENSURE ENLIVE, (ENSURE  ENLIVE) LIQD Take 237 mLs by mouth 2 (two) times daily between meals.  . furosemide (LASIX) 40 MG tablet Take 1 tablet (40 mg total) by mouth daily as needed.  . gabapentin (NEURONTIN) 600 MG tablet Take 600 mg by mouth 4 (four) times daily.   . Insulin Glargine (TOUJEO SOLOSTAR) 300 UNIT/ML SOPN Inject 34 Units into the skin at bedtime.   . insulin lispro (HUMALOG KWIKPEN) 100 UNIT/ML KiwkPen Inject 24 Units into the skin daily with supper.   . Ipratropium-Albuterol (COMBIVENT) 20-100 MCG/ACT AERS respimat Inhale 1 puff into the lungs every 6 (six) hours as needed for wheezing or shortness of breath.  . levothyroxine (SYNTHROID) 75 MCG tablet Take 75 mcg by mouth daily.  Marland Kitchen lidocaine-prilocaine (EMLA) cream Apply 1 application topically as needed.  Marland Kitchen losartan (COZAAR) 25 MG tablet Take 1 tablet (25 mg total) by mouth daily.  . metoprolol (LOPRESSOR) 50 MG tablet Take 50 mg by mouth 2 (two) times daily.   Marland Kitchen oxyCODONE-acetaminophen (PERCOCET) 10-325 MG tablet Take 1 tablet by mouth every 6 (six) hours as needed for pain.  . pantoprazole (PROTONIX) 40 MG tablet Take 40 mg by mouth daily.  . pregabalin (LYRICA) 25 MG capsule Take 25 mg by mouth 2 (two) times daily.   Marland Kitchen senna (SENOKOT) 8.6 MG TABS tablet Take 1 tablet by mouth.  . Vitamin D, Ergocalciferol, (DRISDOL) 1.25 MG (50000 UNIT) CAPS capsule Take 50,000 Units by mouth every 7 (seven) days.  Marland Kitchen XTAMPZA ER 27 MG C12A Take 1 capsule by mouth 2 (two) times daily.     Allergies:    Contrast media [iodinated diagnostic agents]   Social History: Social History   Socioeconomic History  . Marital status: Single    Spouse name: Not on file  . Number of children: 3  . Years of education: Not on file  . Highest education level: Not on file  Occupational History  . Occupation: retired   Tobacco Use  . Smoking status: Current Every Day Smoker    Packs/day: 0.25    Years: 40.00    Pack years: 10.00    Types: Cigarettes  . Smokeless tobacco:  Never Used  Vaping Use  . Vaping Use: Never used  Substance and Sexual Activity  . Alcohol use: Yes  . Drug use: No  . Sexual activity: Not on file  Other Topics Concern  . Not on file  Social History Narrative   Lives with daughter    Dorie Rank from [place]    Social Determinants of Health   Financial Resource Strain:   . Difficulty of Paying Living Expenses:   Food Insecurity:   . Worried About Charity fundraiser in the Last Year:   . [place] in the  Last Year:   Transportation Needs:   . Film/video editor (Medical):   Marland Kitchen Lack of Transportation (Non-Medical):   Physical Activity:   . Days of Exercise per Week:   . Minutes of Exercise per Session:   Stress:   . Feeling of Stress :   Social Connections:   . Frequency of Communication with Friends and Family:   . Frequency of Social Gatherings with Friends and Family:   . Attends Religious Services:   . Active Member of Clubs or Organizations:   . Attends Archivist Meetings:   Marland Kitchen Marital Status:      Family History: [name] family history includes Breast cancer in her maternal aunt; Breast cancer (age of onset: 64) in her maternal aunt; Diabetes in her father; Hypertension in her brother and mother.  ROS:   All other ROS reviewed and negative. Pertinent positives noted in the HPI.     EKGs/Labs/Other Studies Reviewed:   The following studies were personally reviewed by me today:  TTE 11/20/2019 1. Left ventricular ejection fraction, by estimation, is 60 to 65%. The  left ventricle has normal function. The left ventricle has no regional  wall motion abnormalities. There is mild left ventricular hypertrophy.  Left ventricular diastolic parameters  are consistent with Grade II diastolic dysfunction (pseudonormalization).  Elevated left ventricular end-diastolic pressure.  2. Right ventricular systolic function is normal. The right ventricular  size is normal.  3. The mitral valve is normal in  structure. Trivial mitral valve  regurgitation. No evidence of mitral stenosis.  4. The aortic valve is tricuspid. Aortic valve regurgitation is not  visualized. Mild aortic valve sclerosis is present, with no evidence of  aortic valve stenosis.  5. The inferior vena cava is normal in size with greater than 50%  respiratory variability, suggesting right atrial pressure of 3 mmHg.   Recent Labs: 03/27/2019: B Natriuretic Peptide 232.2 11/19/2019: ALT 23 11/20/2019: TSH 3.079 11/22/2019: BUN 12; Creatinine, Ser 1.03; Magnesium 1.8; Potassium 4.2; Sodium 136 12/06/2019: Hemoglobin 6.9; Platelet Count 242   Recent Lipid Panel    Component Value Date/Time   CHOL 179 11/20/2019 0400   TRIG 267 (H) 11/20/2019 0400   HDL 35 (L) 11/20/2019 0400   CHOLHDL 5.1 11/20/2019 0400   VLDL 53 (H) 11/20/2019 0400   LDLCALC 91 11/20/2019 0400    Physical Exam:   VS:  BP (!) 128/58   Pulse 62   Ht 5\' 5"  (1.651 m)   Wt 182 lb (82.6 kg)   BMI 30.29 kg/m    Wt Readings from Last 3 Encounters:  12/12/19 182 lb (82.6 kg)  11/20/19 185 lb 3 oz (84 kg)  11/08/19 178 lb 11.2 oz (81.1 kg)    General: Well nourished, well developed, in no acute distress Heart: Atraumatic, normal size  Eyes: PEERLA, EOMI  Neck: Supple, no JVD Endocrine: No thryomegaly Cardiac: Normal S1, S2; RRR; no murmurs, rubs, or gallops Lungs: Clear to auscultation bilaterally, no wheezing, rhonchi or rales  Abd: Soft, nontender, no hepatomegaly  Ext: No edema, pulses 2+ Musculoskeletal: No deformities, BUE and BLE strength normal and equal Skin: Warm and dry, no rashes   Neuro: Alert and oriented to person, place, time, and situation, CNII-XII grossly intact, no focal deficits  Psych: Normal mood and affect   ASSESSMENT:   TERRILEE DUDZIK is a 69 y.o. female who presents for the following: 1. Chronic heart failure with preserved ejection fraction ([place])   2. Essential hypertension  3. Stage 3a chronic kidney disease   4.  Mixed hyperlipidemia   5. Tobacco abuse     PLAN:   1. Chronic heart failure with preserved ejection fraction ([place]) 2. Essential hypertension -Euvolemic on examination.  No need to change any medications.  Her blood pressure is well controlled.  We are treating her cholesterol with Lipitor and Zetia.  Her diabetes is well controlled as well.  She likely could back off on some of her insulin.  3. Stage 3a chronic kidney disease -Stable  4. Mixed hyperlipidemia -Continue Lipitor and Zetia.  Her goal LDL cholesterol is less than 70.  Given that she is had recurrent stroke we do need to get there sooner than later.  We also discussed that given her persistent anemia she cannot be on anticoagulation.  This was discussed in the past given her renal artery embolic event.  Given her recent stroke I see no reason to look for atrial fibrillation or reason anticoagulate her as she cannot be on anticoagulation therapy.  5. Tobacco abuse -Smoking cessation counseling provided.   Disposition: Return in about 1 year (around 12/11/2020).  Medication Adjustments/Labs and Tests Ordered: Current medicines are reviewed at length with the patient today.  Concerns regarding medicines are outlined above.  No orders of the defined types were placed in this encounter.  No orders of the defined types were placed in this encounter.   Patient Instructions  Medication Instructions:  The current medical regimen is effective;  continue present plan and medications.  *If you need a refill on your cardiac medications before your next appointment, please call your pharmacy*   Follow-Up: At [facility], you and your health needs are our priority.  As part of our continuing mission to provide you with exceptional heart care, we have created designated Provider Care Teams.  These Care Teams include your primary Cardiologist (physician) and Advanced Practice Providers (APPs -  Physician Assistants and Nurse  Practitioners) who all work together to provide you with the care you need, when you need it.  We recommend signing up for the patient portal called "MyChart".  Sign up information is provided on this After Visit Summary.  MyChart is used to connect with patients for Virtual Visits (Telemedicine).  Patients are able to view lab/test results, encounter notes, upcoming appointments, etc.  Non-urgent messages can be sent to your provider as well.   To learn more about what you can do with MyChart, go to [email or web address].    Your next appointment:   12 month(s)  The format for your next appointment:   In Person  Provider:   Eleonore Chiquito, MD        Time Spent with Patient: I have spent a total of 25 minutes with patient reviewing hospital notes, telemetry, EKGs, labs and examining the patient as well as establishing an assessment and plan that was discussed with the patient.  > 50% of time was spent in direct patient care.  Signed, Addison Naegeli. Audie Box, [place]  [address] [place], [place] [address] [phone]  12/12/2019 12:42 PM

## 2019-12-11 ENCOUNTER — Telehealth: Payer: Self-pay | Admitting: Hematology

## 2019-12-11 NOTE — Telephone Encounter (Signed)
Scheduled appt per 7/24 sch msg - pt is aware of appts

## 2019-12-12 ENCOUNTER — Encounter: Payer: Self-pay | Admitting: Cardiovascular Disease

## 2019-12-12 ENCOUNTER — Ambulatory Visit (INDEPENDENT_AMBULATORY_CARE_PROVIDER_SITE_OTHER): Payer: Medicare Other | Admitting: Cardiovascular Disease

## 2019-12-12 ENCOUNTER — Other Ambulatory Visit: Payer: Self-pay

## 2019-12-12 VITALS — BP 128/58 | HR 62 | Ht 65.0 in | Wt 182.0 lb

## 2019-12-12 DIAGNOSIS — I10 Essential (primary) hypertension: Secondary | ICD-10-CM

## 2019-12-12 DIAGNOSIS — I5032 Chronic diastolic (congestive) heart failure: Secondary | ICD-10-CM

## 2019-12-12 DIAGNOSIS — E782 Mixed hyperlipidemia: Secondary | ICD-10-CM | POA: Diagnosis not present

## 2019-12-12 DIAGNOSIS — Z720 Tobacco use: Secondary | ICD-10-CM

## 2019-12-12 DIAGNOSIS — N1831 Chronic kidney disease, stage 3a: Secondary | ICD-10-CM

## 2019-12-12 NOTE — Patient Instructions (Signed)

## 2019-12-16 ENCOUNTER — Other Ambulatory Visit: Payer: Medicare Other

## 2019-12-16 ENCOUNTER — Ambulatory Visit: Payer: Medicare Other

## 2019-12-17 ENCOUNTER — Other Ambulatory Visit: Payer: Self-pay

## 2019-12-17 ENCOUNTER — Inpatient Hospital Stay: Payer: Medicare Other

## 2019-12-17 ENCOUNTER — Inpatient Hospital Stay: Payer: Medicare Other | Attending: Hematology

## 2019-12-17 VITALS — BP 144/62 | HR 51 | Temp 98.1°F | Resp 18

## 2019-12-17 DIAGNOSIS — Q2733 Arteriovenous malformation of digestive system vessel: Secondary | ICD-10-CM | POA: Diagnosis not present

## 2019-12-17 DIAGNOSIS — K5520 Angiodysplasia of colon without hemorrhage: Secondary | ICD-10-CM

## 2019-12-17 DIAGNOSIS — D500 Iron deficiency anemia secondary to blood loss (chronic): Secondary | ICD-10-CM | POA: Diagnosis present

## 2019-12-17 LAB — CBC WITH DIFFERENTIAL (CANCER CENTER ONLY)
Abs Immature Granulocytes: 0.02 10*3/uL (ref 0.00–0.07)
Basophils Absolute: 0.1 10*3/uL (ref 0.0–0.1)
Basophils Relative: 1 %
Eosinophils Absolute: 0.2 10*3/uL (ref 0.0–0.5)
Eosinophils Relative: 3 %
HCT: 36.1 % (ref 36.0–46.0)
Hemoglobin: 10.8 g/dL — ABNORMAL LOW (ref 12.0–15.0)
Immature Granulocytes: 0 %
Lymphocytes Relative: 13 %
Lymphs Abs: 1 10*3/uL (ref 0.7–4.0)
MCH: 25.4 pg — ABNORMAL LOW (ref 26.0–34.0)
MCHC: 29.9 g/dL — ABNORMAL LOW (ref 30.0–36.0)
MCV: 84.7 fL (ref 80.0–100.0)
Monocytes Absolute: 0.5 10*3/uL (ref 0.1–1.0)
Monocytes Relative: 6 %
Neutro Abs: 6.1 10*3/uL (ref 1.7–7.7)
Neutrophils Relative %: 77 %
Platelet Count: 268 10*3/uL (ref 150–400)
RBC: 4.26 MIL/uL (ref 3.87–5.11)
RDW: 17.2 % — ABNORMAL HIGH (ref 11.5–15.5)
WBC Count: 7.9 10*3/uL (ref 4.0–10.5)
nRBC: 0 % (ref 0.0–0.2)

## 2019-12-17 LAB — FERRITIN: Ferritin: 151 ng/mL (ref 11–307)

## 2019-12-17 LAB — IRON AND TIBC
Iron: 36 ug/dL — ABNORMAL LOW (ref 41–142)
Saturation Ratios: 11 % — ABNORMAL LOW (ref 21–57)
TIBC: 343 ug/dL (ref 236–444)
UIBC: 306 ug/dL (ref 120–384)

## 2019-12-17 MED ORDER — HEPARIN SOD (PORK) LOCK FLUSH 100 UNIT/ML IV SOLN
500.0000 [IU] | Freq: Once | INTRAVENOUS | Status: AC
  Administered 2019-12-17: 500 [IU] via INTRAVENOUS
  Filled 2019-12-17: qty 5

## 2019-12-17 MED ORDER — SODIUM CHLORIDE 0.9% FLUSH
10.0000 mL | INTRAVENOUS | Status: DC | PRN
  Administered 2019-12-17: 10 mL
  Filled 2019-12-17: qty 10

## 2019-12-17 MED ORDER — SODIUM CHLORIDE 0.9 % IV SOLN
INTRAVENOUS | Status: DC
  Filled 2019-12-17: qty 250

## 2019-12-17 MED ORDER — SODIUM CHLORIDE 0.9 % IV SOLN
400.0000 mg | Freq: Once | INTRAVENOUS | Status: AC
  Administered 2019-12-17: 400 mg via INTRAVENOUS
  Filled 2019-12-17: qty 20

## 2019-12-17 NOTE — Patient Instructions (Signed)
Iron Sucrose injection What is this medicine? IRON SUCROSE (AHY ern SOO krohs) is an iron complex. Iron is used to make healthy red blood cells, which carry oxygen and nutrients throughout the body. This medicine is used to treat iron deficiency anemia in people with chronic kidney disease. This medicine may be used for other purposes; ask your health care provider or pharmacist if you have questions. COMMON BRAND NAME(S): Venofer What should I tell my health care provider before I take this medicine? They need to know if you have any of these conditions:  anemia not caused by low iron levels  heart disease  high levels of iron in the blood  kidney disease  liver disease  an unusual or allergic reaction to iron, other medicines, foods, dyes, or preservatives  pregnant or trying to get pregnant  breast-feeding How should I use this medicine? This medicine is for infusion into a vein. It is given by a health care professional in a hospital or clinic setting. Talk to your pediatrician regarding the use of this medicine in children. While this drug may be prescribed for children as young as 2 years for selected conditions, precautions do apply. Overdosage: If you think you have taken too much of this medicine contact a poison control center or emergency room at once. NOTE: This medicine is only for you. Do not share this medicine with others. What if I miss a dose? It is important not to miss your dose. Call your doctor or health care professional if you are unable to keep an appointment. What may interact with this medicine? Do not take this medicine with any of the following medications:  deferoxamine  dimercaprol  other iron products This medicine may also interact with the following medications:  chloramphenicol  deferasirox This list may not describe all possible interactions. Give your health care provider a list of all the medicines, herbs, non-prescription drugs, or  dietary supplements you use. Also tell them if you smoke, drink alcohol, or use illegal drugs. Some items may interact with your medicine. What should I watch for while using this medicine? Visit your doctor or healthcare professional regularly. Tell your doctor or healthcare professional if your symptoms do not start to get better or if they get worse. You may need blood work done while you are taking this medicine. You may need to follow a special diet. Talk to your doctor. Foods that contain iron include: whole grains/cereals, dried fruits, beans, or peas, leafy green vegetables, and organ meats (liver, kidney). What side effects may I notice from receiving this medicine? Side effects that you should report to your doctor or health care professional as soon as possible:  allergic reactions like skin rash, itching or hives, swelling of the face, lips, or tongue  breathing problems  changes in blood pressure  cough  fast, irregular heartbeat  feeling faint or lightheaded, falls  fever or chills  flushing, sweating, or hot feelings  joint or muscle aches/pains  seizures  swelling of the ankles or feet  unusually weak or tired Side effects that usually do not require medical attention (report to your doctor or health care professional if they continue or are bothersome):  diarrhea  feeling achy  headache  irritation at site where injected  nausea, vomiting  stomach upset  tiredness This list may not describe all possible side effects. Call your doctor for medical advice about side effects. You may report side effects to FDA at 1-800-FDA-1088. Where should I keep   my medicine? This drug is given in a hospital or clinic and will not be stored at home. NOTE: This sheet is a summary. It may not cover all possible information. If you have questions about this medicine, talk to your doctor, pharmacist, or health care provider.  2020 Elsevier/Gold Standard (2011-02-10  17:14:35) https://www.redcrossblood.org/donate-blood/blood-donation-process/what-happens-to-donated-blood/blood-transfusions/types-of-blood-transfusions.html"> https://www.hematology.org/education/patients/blood-basics/blood-safety-and-matching"> https://www.nhlbi.nih.gov/health-topics/blood-transfusion">  Blood Transfusion, Adult A blood transfusion is a procedure in which you receive blood or a type of blood cell (blood component) through an IV. You may need a blood transfusion when your blood level is low. This may result from a bleeding disorder, illness, injury, or surgery. The blood may come from a donor. You may also be able to donate blood for yourself (autologous blood donation) before a planned surgery. The blood given in a transfusion is made up of different blood components. You may receive:  Red blood cells. These carry oxygen to the cells in the body.  Platelets. These help your blood to clot.  Plasma. This is the liquid part of your blood. It carries proteins and other substances throughout the body.  White blood cells. These help you fight infections. If you have hemophilia or another clotting disorder, you may also receive other types of blood products. Tell a health care provider about:  Any blood disorders you have.  Any previous reactions you have had during a blood transfusion.  Any allergies you have.  All medicines you are taking, including vitamins, herbs, eye drops, creams, and over-the-counter medicines.  Any surgeries you have had.  Any medical conditions you have, including any recent fever or cold symptoms.  Whether you are pregnant or may be pregnant. What are the risks? Generally, this is a safe procedure. However, problems may occur.  The most common problems include: ? A mild allergic reaction, such as red, swollen areas of skin (hives) and itching. ? Fever or chills. This may be the body's response to new blood cells received. This may occur during  or up to 4 hours after the transfusion.  More serious problems may include: ? Transfusion-associated circulatory overload (TACO), or too much fluid in the lungs. This may cause breathing problems. ? A serious allergic reaction, such as difficulty breathing or swelling around the face and lips. ? Transfusion-related acute lung injury (TRALI), which causes breathing difficulty and low oxygen in the blood. This can occur within hours of the transfusion or several days later. ? Iron overload. This can happen after receiving many blood transfusions over a period of time. ? Infection or virus being transmitted. This is rare because donated blood is carefully tested before it is given. ? Hemolytic transfusion reaction. This is rare. It happens when your body's defense system (immune system)tries to attack the new blood cells. Symptoms may include fever, chills, nausea, low blood pressure, and low back or chest pain. ? Transfusion-associated graft-versus-host disease (TAGVHD). This is rare. It happens when donated cells attack your body's healthy tissues. What happens before the procedure? Medicines Ask your health care provider about:  Changing or stopping your regular medicines. This is especially important if you are taking diabetes medicines or blood thinners.  Taking medicines such as aspirin and ibuprofen. These medicines can thin your blood. Do not take these medicines unless your health care provider tells you to take them.  Taking over-the-counter medicines, vitamins, herbs, and supplements. General instructions  Follow instructions from your health care provider about eating and drinking restrictions.  You will have a blood test to determine your blood type. This   is necessary to know what kind of blood your body will accept and to match it to the donor blood.  If you are going to have a planned surgery, you may be able to do an autologous blood donation. This may be done in case you need  to have a transfusion.  You will have your temperature, blood pressure, and pulse monitored before the transfusion.  If you have had an allergic reaction to a transfusion in the past, you may be given medicine to help prevent a reaction. This medicine may be given to you by mouth (orally) or through an IV.  Set aside time for the blood transfusion. This procedure generally takes 1-4 hours to complete. What happens during the procedure?   An IV will be inserted into one of your veins.  The bag of donated blood will be attached to your IV. The blood will then enter through your vein.  Your temperature, blood pressure, and pulse will be monitored regularly during the transfusion. This monitoring is done to detect early signs of a transfusion reaction.  Tell your nurse right away if you have any of these symptoms during the transfusion: ? Shortness of breath or trouble breathing. ? Chest or back pain. ? Fever or chills. ? Hives or itching.  If you have any signs or symptoms of a reaction, your transfusion will be stopped and you may be given medicine.  When the transfusion is complete, your IV will be removed.  Pressure may be applied to the IV site for a few minutes.  A bandage (dressing)will be applied. The procedure may vary among health care providers and hospitals. What happens after the procedure?  Your temperature, blood pressure, pulse, breathing rate, and blood oxygen level will be monitored until you leave the hospital or clinic.  Your blood may be tested to see how you are responding to the transfusion.  You may be warmed with fluids or blankets to maintain a normal body temperature.  If you receive your blood transfusion in an outpatient setting, you will be told whom to contact to report any reactions. Where to find more information For more information on blood transfusions, visit the American Red Cross: redcross.org Summary  A blood transfusion is a procedure in  which you receive blood or a type of blood cell (blood component) through an IV.  The blood you receive may come from a donor or be donated by yourself (autologous blood donation) before a planned surgery.  The blood given in a transfusion is made up of different blood components. You may receive red blood cells, platelets, plasma, or white blood cells depending on the condition treated.  Your temperature, blood pressure, and pulse will be monitored before, during, and after the transfusion.  After the transfusion, your blood may be tested to see how your body has responded. This information is not intended to replace advice given to you by your health care provider. Make sure you discuss any questions you have with your health care provider. Document Revised: 10/25/2018 Document Reviewed: 10/25/2018 Elsevier Patient Education  2020 Elsevier Inc.  

## 2019-12-23 ENCOUNTER — Inpatient Hospital Stay: Payer: Medicare Other

## 2019-12-23 ENCOUNTER — Inpatient Hospital Stay: Payer: Medicare Other | Admitting: Hematology
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
## 2019-12-30 ENCOUNTER — Inpatient Hospital Stay: Payer: Medicare Other

## 2019-12-30 ENCOUNTER — Inpatient Hospital Stay (HOSPITAL_BASED_OUTPATIENT_CLINIC_OR_DEPARTMENT_OTHER): Payer: Medicare Other | Admitting: Physician Assistant

## 2019-12-30 ENCOUNTER — Other Ambulatory Visit: Payer: Self-pay

## 2019-12-30 VITALS — BP 169/53 | HR 69 | Temp 97.8°F | Resp 20 | Ht 65.0 in | Wt 176.5 lb

## 2019-12-30 DIAGNOSIS — K921 Melena: Secondary | ICD-10-CM | POA: Diagnosis not present

## 2019-12-30 DIAGNOSIS — D500 Iron deficiency anemia secondary to blood loss (chronic): Secondary | ICD-10-CM

## 2019-12-30 DIAGNOSIS — Q2733 Arteriovenous malformation of digestive system vessel: Secondary | ICD-10-CM | POA: Diagnosis not present

## 2019-12-30 DIAGNOSIS — K5520 Angiodysplasia of colon without hemorrhage: Secondary | ICD-10-CM

## 2019-12-30 DIAGNOSIS — Z95828 Presence of other vascular implants and grafts: Secondary | ICD-10-CM

## 2019-12-30 LAB — CBC WITH DIFFERENTIAL (CANCER CENTER ONLY)
Abs Immature Granulocytes: 0.08 10*3/uL — ABNORMAL HIGH (ref 0.00–0.07)
Basophils Absolute: 0 10*3/uL (ref 0.0–0.1)
Basophils Relative: 0 %
Eosinophils Absolute: 0.1 10*3/uL (ref 0.0–0.5)
Eosinophils Relative: 1 %
HCT: 36.5 % (ref 36.0–46.0)
Hemoglobin: 10.8 g/dL — ABNORMAL LOW (ref 12.0–15.0)
Immature Granulocytes: 1 %
Lymphocytes Relative: 10 %
Lymphs Abs: 1.1 10*3/uL (ref 0.7–4.0)
MCH: 25.1 pg — ABNORMAL LOW (ref 26.0–34.0)
MCHC: 29.6 g/dL — ABNORMAL LOW (ref 30.0–36.0)
MCV: 84.9 fL (ref 80.0–100.0)
Monocytes Absolute: 0.5 10*3/uL (ref 0.1–1.0)
Monocytes Relative: 4 %
Neutro Abs: 10 10*3/uL — ABNORMAL HIGH (ref 1.7–7.7)
Neutrophils Relative %: 84 %
Platelet Count: 259 10*3/uL (ref 150–400)
RBC: 4.3 MIL/uL (ref 3.87–5.11)
RDW: 16.8 % — ABNORMAL HIGH (ref 11.5–15.5)
WBC Count: 11.8 10*3/uL — ABNORMAL HIGH (ref 4.0–10.5)
nRBC: 0.2 % (ref 0.0–0.2)

## 2019-12-30 LAB — IRON AND TIBC
Iron: 64 ug/dL (ref 41–142)
Saturation Ratios: 18 % — ABNORMAL LOW (ref 21–57)
TIBC: 353 ug/dL (ref 236–444)
UIBC: 289 ug/dL (ref 120–384)

## 2019-12-30 LAB — FERRITIN: Ferritin: 151 ng/mL (ref 11–307)

## 2019-12-30 MED ORDER — HEPARIN SOD (PORK) LOCK FLUSH 100 UNIT/ML IV SOLN
500.0000 [IU] | Freq: Once | INTRAVENOUS | Status: AC
  Administered 2019-12-30: 500 [IU] via INTRAVENOUS
  Filled 2019-12-30: qty 5

## 2019-12-30 MED ORDER — SODIUM CHLORIDE 0.9 % IV SOLN
INTRAVENOUS | Status: DC
  Filled 2019-12-30: qty 250

## 2019-12-30 MED ORDER — SODIUM CHLORIDE 0.9 % IV SOLN
400.0000 mg | Freq: Once | INTRAVENOUS | Status: AC
  Administered 2019-12-30: 400 mg via INTRAVENOUS
  Filled 2019-12-30: qty 20

## 2019-12-30 MED ORDER — SODIUM CHLORIDE 0.9% FLUSH
10.0000 mL | Freq: Once | INTRAVENOUS | Status: AC
  Administered 2019-12-30: 10 mL via INTRAVENOUS
  Filled 2019-12-30: qty 10

## 2019-12-30 MED ORDER — SODIUM CHLORIDE 0.9% FLUSH
10.0000 mL | INTRAVENOUS | Status: DC | PRN
  Administered 2019-12-30: 10 mL via INTRAVENOUS
  Filled 2019-12-30: qty 10

## 2019-12-30 NOTE — Patient Instructions (Signed)

## 2019-12-31 ENCOUNTER — Telehealth: Payer: Self-pay | Admitting: *Deleted

## 2019-12-31 NOTE — Telephone Encounter (Signed)
Called patient and provided appointment information to Dr. Jeanie Cooks for 01/01/2020 @ 1:00pm at [facility].  Patient took down appt info.  Said she would do her best to attend, would have to coordinate transportation to this appointment.    Records faxed to Dr. Harrington Challenger office [phone]

## 2020-01-01 ENCOUNTER — Telehealth: Payer: Self-pay | Admitting: Physician Assistant

## 2020-01-01 NOTE — Telephone Encounter (Signed)
Scheduled per los. Called and spoke with patient. Confirmed appt 

## 2020-01-08 ENCOUNTER — Inpatient Hospital Stay: Payer: Medicare Other | Admitting: Neurology

## 2020-01-08 ENCOUNTER — Encounter: Payer: Self-pay | Admitting: Adult Health

## 2020-01-08 ENCOUNTER — Ambulatory Visit (INDEPENDENT_AMBULATORY_CARE_PROVIDER_SITE_OTHER): Payer: Medicare Other | Admitting: Adult Health

## 2020-01-08 VITALS — BP 104/55 | HR 59 | Ht 65.0 in | Wt 178.6 lb

## 2020-01-08 DIAGNOSIS — E785 Hyperlipidemia, unspecified: Secondary | ICD-10-CM | POA: Diagnosis not present

## 2020-01-08 DIAGNOSIS — I10 Essential (primary) hypertension: Secondary | ICD-10-CM | POA: Diagnosis not present

## 2020-01-08 DIAGNOSIS — E1142 Type 2 diabetes mellitus with diabetic polyneuropathy: Secondary | ICD-10-CM

## 2020-01-08 DIAGNOSIS — Z720 Tobacco use: Secondary | ICD-10-CM

## 2020-01-08 DIAGNOSIS — Z794 Long term (current) use of insulin: Secondary | ICD-10-CM

## 2020-01-08 DIAGNOSIS — I639 Cerebral infarction, unspecified: Secondary | ICD-10-CM

## 2020-01-08 DIAGNOSIS — I671 Cerebral aneurysm, nonruptured: Secondary | ICD-10-CM
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
## 2020-01-08 NOTE — Patient Instructions (Addendum)
Continued participation with outpatient speech therapy for hopeful further improvement of her speech difficulties  Continue aspirin 81 mg daily  and Zetia for secondary stroke prevention  Try to take a vacation from lipitor for a couple days to see if your right leg symptoms improve - if so, please let me know on Monday and will recommend changing medication  If it does not improve, please restart lipitor and follow up with your pain doctor regarding possible adjustment of your Lyrica as this pain could be post stroke type pain  Will place referral to Dr. Estanislado Pandy to follow up on your aneurysm   Continue to follow up with PCP regarding cholesterol and blood pressure management  Maintain strict control of hypertension with blood pressure goal below 130/90, diabetes with hemoglobin A1c goal below 7.0% and cholesterol with LDL cholesterol (bad cholesterol) goal below 70 mg/dL.       Followup in the future with me in 3 months or call earlier if needed       Thank you for coming to see [place] at [facility]. I hope we have been able to provide you high quality care today.  You may receive a patient satisfaction survey over the next few weeks. We would appreciate your feedback and comments so that we may continue to improve ourselves and the health of our patients.

## 2020-01-10 ENCOUNTER — Inpatient Hospital Stay: Payer: Medicare Other

## 2020-01-10 ENCOUNTER — Other Ambulatory Visit: Payer: Self-pay

## 2020-01-10 ENCOUNTER — Ambulatory Visit (HOSPITAL_COMMUNITY): Admission: RE | Admit: 2020-01-10 | Payer: Medicare Other | Source: Ambulatory Visit

## 2020-01-10 ENCOUNTER — Other Ambulatory Visit (HOSPITAL_COMMUNITY): Payer: Self-pay | Admitting: Interventional Radiology

## 2020-01-10 VITALS — BP 134/77 | HR 61 | Temp 98.2°F | Resp 18

## 2020-01-10 DIAGNOSIS — K5520 Angiodysplasia of colon without hemorrhage: Secondary | ICD-10-CM

## 2020-01-10 DIAGNOSIS — Z95828 Presence of other vascular implants and grafts: Secondary | ICD-10-CM

## 2020-01-10 DIAGNOSIS — D500 Iron deficiency anemia secondary to blood loss (chronic): Secondary | ICD-10-CM

## 2020-01-10 DIAGNOSIS — Q2733 Arteriovenous malformation of digestive system vessel: Secondary | ICD-10-CM | POA: Diagnosis not present

## 2020-01-10 DIAGNOSIS — I671 Cerebral aneurysm, nonruptured: Secondary | ICD-10-CM

## 2020-01-10 LAB — CBC WITH DIFFERENTIAL (CANCER CENTER ONLY)
Abs Immature Granulocytes: 0.03 10*3/uL (ref 0.00–0.07)
Basophils Absolute: 0 10*3/uL (ref 0.0–0.1)
Basophils Relative: 0 %
Eosinophils Absolute: 0.2 10*3/uL (ref 0.0–0.5)
Eosinophils Relative: 2 %
HCT: 32.7 % — ABNORMAL LOW (ref 36.0–46.0)
Hemoglobin: 9.6 g/dL — ABNORMAL LOW (ref 12.0–15.0)
Immature Granulocytes: 0 %
Lymphocytes Relative: 16 %
Lymphs Abs: 1.6 10*3/uL (ref 0.7–4.0)
MCH: 25.2 pg — ABNORMAL LOW (ref 26.0–34.0)
MCHC: 29.4 g/dL — ABNORMAL LOW (ref 30.0–36.0)
MCV: 85.8 fL (ref 80.0–100.0)
Monocytes Absolute: 0.8 10*3/uL (ref 0.1–1.0)
Monocytes Relative: 8 %
Neutro Abs: 7.5 10*3/uL (ref 1.7–7.7)
Neutrophils Relative %: 74 %
Platelet Count: 276 10*3/uL (ref 150–400)
RBC: 3.81 MIL/uL — ABNORMAL LOW (ref 3.87–5.11)
RDW: 16.5 % — ABNORMAL HIGH (ref 11.5–15.5)
WBC Count: 10.1 10*3/uL (ref 4.0–10.5)
nRBC: 0 % (ref 0.0–0.2)

## 2020-01-10 LAB — IRON AND TIBC
Iron: 37 ug/dL — ABNORMAL LOW (ref 41–142)
Saturation Ratios: 11 % — ABNORMAL LOW (ref 21–57)
TIBC: 333 ug/dL (ref 236–444)
UIBC: 296 ug/dL (ref 120–384)

## 2020-01-10 LAB — FERRITIN: Ferritin: 183 ng/mL (ref 11–307)

## 2020-01-10 MED ORDER — SODIUM CHLORIDE 0.9 % IV SOLN
200.0000 mg | Freq: Once | INTRAVENOUS | Status: AC
  Administered 2020-01-10: 200 mg via INTRAVENOUS
  Filled 2020-01-10: qty 200

## 2020-01-10 MED ORDER — SODIUM CHLORIDE 0.9 % IV SOLN
INTRAVENOUS | Status: DC
  Filled 2020-01-10: qty 250

## 2020-01-10 MED ORDER — SODIUM CHLORIDE 0.9% FLUSH
10.0000 mL | INTRAVENOUS | Status: DC | PRN
  Administered 2020-01-10: 10 mL via INTRAVENOUS
  Filled 2020-01-10: qty 10

## 2020-01-10 MED ORDER — SODIUM CHLORIDE 0.9 % IV SOLN: 400.0000 mg | Freq: Once | INTRAVENOUS | Status: DC

## 2020-01-10 NOTE — Patient Instructions (Signed)

## 2020-01-10 NOTE — Progress Notes (Signed)
Give venofer 200mg  today for a total replacement of 1000mg .  Ok per Dr Burr Medico

## 2020-01-10 NOTE — Patient Instructions (Signed)

## 2020-01-14 ENCOUNTER — Telehealth (HOSPITAL_COMMUNITY): Payer: Self-pay

## 2020-01-14 ENCOUNTER — Telehealth: Payer: Self-pay

## 2020-01-14 NOTE — Telephone Encounter (Signed)
Returned pt's call to schedule consult, no answer, left vm. AW

## 2020-01-14 NOTE — Telephone Encounter (Signed)
-----   Message from Truitt Merle, MD sent at 01/14/2020  8:57 AM EDT ----- Please let pt know her lab results, ferritin ok, but serum iron low and slightly worse anemia, please schedule one dose venofer this or next week, check if she has any signs of GI bleeding, thanks   Truitt Merle

## 2020-01-14 NOTE — Telephone Encounter (Signed)
Called patient and made her aware of lab results. Patient aware that scheduling department will contact her to set up Venofer infusion this week or next. Patient verbalized understanding. Scheduling message sent.

## 2020-01-14 NOTE — Progress Notes (Signed)
I agree with the above plan 

## 2020-01-22 ENCOUNTER — Inpatient Hospital Stay: Payer: Medicare Other

## 2020-01-24 ENCOUNTER — Inpatient Hospital Stay: Payer: Medicare Other

## 2020-01-24 ENCOUNTER — Other Ambulatory Visit: Payer: Self-pay

## 2020-01-24 ENCOUNTER — Inpatient Hospital Stay: Payer: Medicare Other | Attending: Hematology

## 2020-01-24 VITALS — BP 143/60 | HR 61 | Temp 98.1°F | Resp 16 | Wt 176.8 lb

## 2020-01-24 DIAGNOSIS — Z7982 Long term (current) use of aspirin: Secondary | ICD-10-CM | POA: Insufficient documentation

## 2020-01-24 DIAGNOSIS — J449 Chronic obstructive pulmonary disease, unspecified: Secondary | ICD-10-CM | POA: Diagnosis not present

## 2020-01-24 DIAGNOSIS — N183 Chronic kidney disease, stage 3 unspecified: Secondary | ICD-10-CM | POA: Diagnosis not present

## 2020-01-24 DIAGNOSIS — I129 Hypertensive chronic kidney disease with stage 1 through stage 4 chronic kidney disease, or unspecified chronic kidney disease: Secondary | ICD-10-CM | POA: Insufficient documentation

## 2020-01-24 DIAGNOSIS — Q2733 Arteriovenous malformation of digestive system vessel: Secondary | ICD-10-CM | POA: Insufficient documentation

## 2020-01-24 DIAGNOSIS — G8929 Other chronic pain: Secondary | ICD-10-CM | POA: Diagnosis not present

## 2020-01-24 DIAGNOSIS — E1122 Type 2 diabetes mellitus with diabetic chronic kidney disease: Secondary | ICD-10-CM | POA: Insufficient documentation

## 2020-01-24 DIAGNOSIS — D500 Iron deficiency anemia secondary to blood loss (chronic): Secondary | ICD-10-CM | POA: Diagnosis present

## 2020-01-24 DIAGNOSIS — K5520 Angiodysplasia of colon without hemorrhage: Secondary | ICD-10-CM

## 2020-01-24 DIAGNOSIS — M549 Dorsalgia, unspecified: Secondary | ICD-10-CM | POA: Diagnosis not present

## 2020-01-24 DIAGNOSIS — Z95828 Presence of other vascular implants and grafts: Secondary | ICD-10-CM

## 2020-01-24 LAB — CBC WITH DIFFERENTIAL (CANCER CENTER ONLY)
Abs Immature Granulocytes: 0.04 10*3/uL (ref 0.00–0.07)
Basophils Absolute: 0 10*3/uL (ref 0.0–0.1)
Basophils Relative: 0 %
Eosinophils Absolute: 0.2 10*3/uL (ref 0.0–0.5)
Eosinophils Relative: 2 %
HCT: 36.3 % (ref 36.0–46.0)
Hemoglobin: 10.6 g/dL — ABNORMAL LOW (ref 12.0–15.0)
Immature Granulocytes: 1 %
Lymphocytes Relative: 16 %
Lymphs Abs: 1.5 10*3/uL (ref 0.7–4.0)
MCH: 24.6 pg — ABNORMAL LOW (ref 26.0–34.0)
MCHC: 29.2 g/dL — ABNORMAL LOW (ref 30.0–36.0)
MCV: 84.2 fL (ref 80.0–100.0)
Monocytes Absolute: 0.5 10*3/uL (ref 0.1–1.0)
Monocytes Relative: 5 %
Neutro Abs: 6.7 10*3/uL (ref 1.7–7.7)
Neutrophils Relative %: 76 %
Platelet Count: 247 10*3/uL (ref 150–400)
RBC: 4.31 MIL/uL (ref 3.87–5.11)
RDW: 15.9 % — ABNORMAL HIGH (ref 11.5–15.5)
WBC Count: 8.8 10*3/uL (ref 4.0–10.5)
nRBC: 0 % (ref 0.0–0.2)

## 2020-01-24 LAB — IRON AND TIBC
Iron: 45 ug/dL (ref 41–142)
Saturation Ratios: 12 % — ABNORMAL LOW (ref 21–57)
TIBC: 379 ug/dL (ref 236–444)
UIBC: 333 ug/dL (ref 120–384)

## 2020-01-24 LAB — CMP (CANCER CENTER ONLY)
ALT: 56 U/L — ABNORMAL HIGH (ref 0–44)
AST: 28 U/L (ref 15–41)
Albumin: 3.9 g/dL (ref 3.5–5.0)
Alkaline Phosphatase: 237 U/L — ABNORMAL HIGH (ref 38–126)
Anion gap: 9 (ref 5–15)
BUN: 26 mg/dL — ABNORMAL HIGH (ref 8–23)
CO2: 23 mmol/L (ref 22–32)
Calcium: 10.8 mg/dL — ABNORMAL HIGH (ref 8.9–10.3)
Chloride: 107 mmol/L (ref 98–111)
Creatinine: 1.21 mg/dL — ABNORMAL HIGH (ref 0.44–1.00)
GFR, Est AFR Am: 53 mL/min — ABNORMAL LOW (ref >60)
GFR, Estimated: 46 mL/min — ABNORMAL LOW (ref >60)
Glucose, Bld: 232 mg/dL — ABNORMAL HIGH (ref 70–99)
Potassium: 4.6 mmol/L (ref 3.5–5.1)
Sodium: 139 mmol/L (ref 135–145)
Total Bilirubin: 0.3 mg/dL (ref 0.3–1.2)
Total Protein: 7.2 g/dL (ref 6.5–8.1)

## 2020-01-24 LAB — FERRITIN: Ferritin: 124 ng/mL (ref 11–307)

## 2020-01-24 MED ORDER — SODIUM CHLORIDE 0.9% FLUSH
10.0000 mL | INTRAVENOUS | Status: DC | PRN
  Administered 2020-01-24: 10 mL via INTRAVENOUS
  Filled 2020-01-24: qty 10

## 2020-01-24 MED ORDER — SODIUM CHLORIDE 0.9 % IV SOLN
200.0000 mg | Freq: Once | INTRAVENOUS | Status: AC
  Administered 2020-01-24: 200 mg via INTRAVENOUS
  Filled 2020-01-24: qty 200

## 2020-01-24 MED ORDER — DIPHENHYDRAMINE HCL 50 MG/ML IJ SOLN: 50.0000 mg | Freq: Once | INTRAMUSCULAR | Status: DC | PRN

## 2020-01-24 MED ORDER — SODIUM CHLORIDE 0.9 % IV SOLN
INTRAVENOUS | Status: DC
  Filled 2020-01-24 (×2): qty 250

## 2020-01-24 MED ORDER — EPINEPHRINE 0.3 MG/0.3ML IJ SOAJ: 0.3000 mg | Freq: Once | INTRAMUSCULAR | Status: DC | PRN

## 2020-01-24 MED ORDER — ALTEPLASE 2 MG IJ SOLR
2.0000 mg | Freq: Once | INTRAMUSCULAR | Status: DC | PRN
  Filled 2020-01-24: qty 2

## 2020-01-24 MED ORDER — FAMOTIDINE IN NACL 20-0.9 MG/50ML-% IV SOLN: 20.0000 mg | Freq: Once | INTRAVENOUS | Status: DC | PRN

## 2020-01-24 MED ORDER — ALBUTEROL SULFATE (2.5 MG/3ML) 0.083% IN NEBU
2.5000 mg | INHALATION_SOLUTION | Freq: Once | RESPIRATORY_TRACT | Status: DC | PRN
  Filled 2020-01-24: qty 3

## 2020-01-24 MED ORDER — SODIUM CHLORIDE 0.9 % IV SOLN
Freq: Once | INTRAVENOUS | Status: DC | PRN
  Filled 2020-01-24: qty 250

## 2020-01-24 MED ORDER — SODIUM CHLORIDE 0.9 % IV SOLN: 400.0000 mg | Freq: Once | INTRAVENOUS | Status: DC

## 2020-01-24 MED ORDER — SODIUM CHLORIDE 0.9 % IV SOLN
INTRAVENOUS | Status: DC
  Filled 2020-01-24: qty 250

## 2020-01-24 MED ORDER — METHYLPREDNISOLONE SODIUM SUCC 125 MG IJ SOLR: 125.0000 mg | Freq: Once | INTRAMUSCULAR | Status: DC | PRN

## 2020-01-24 NOTE — Patient Instructions (Signed)

## 2020-01-24 NOTE — Patient Instructions (Signed)

## 2020-01-24 NOTE — Progress Notes (Signed)
Will reduced Venofer to 200 mg IV today per Dr. Burr Medico.  Kennith Center, Pharm.D., CPP 01/24/2020@2 :19 PM

## 2020-01-28 ENCOUNTER — Other Ambulatory Visit: Payer: Self-pay

## 2020-01-28 ENCOUNTER — Ambulatory Visit (HOSPITAL_COMMUNITY)
Admission: RE | Admit: 2020-01-28 | Discharge: 2020-01-28 | Disposition: A | Discharge status: Home / Self Care | Payer: Medicare Other | Source: Ambulatory Visit | Attending: Interventional Radiology | Admitting: Interventional Radiology

## 2020-01-28 DIAGNOSIS — I671 Cerebral aneurysm, nonruptured: Secondary | ICD-10-CM

## 2020-01-28 NOTE — Consult Note (Signed)
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  Referring Physician(s): Dr .Leonie Man Neurology History of Present Illness: Bethany [name] is a 69 y.o. female with a past medical history as below, with pertinent past medical history including thrombosis, hypertension, hyperlipidemia, hypercalcemia, diabetes in the left cerebral hemispheric ischemic stroke on 11/16/2019, who during work-up for stroke was found to have an unruptured left middle cerebral artery aneurysm on MRA examination of the brain, and possibly the supraclinoid left ICA. [name] sequela from the most recent ischemic stroke was aphasia and a right-sided hemiparesis, which has since improved significantly.  I working in the hospital revealed no evidence of an embolic source.  The carotid Doppler was unremarkable with a possible 1 to 39% stenosis.  2D echo demonstrated an ejection fraction of around 60%.  The patient was discharged home on aspirin 75 mg a day to decrease to 81 mg at a time of adding Plavix.  However at the present time patient is only on 81 mg of aspirin.  Neurologically at the present time the patient is totally independent at home constipation.  She lives by herself and is able to manage [place] household chores, and also personal attendance.  Patient reports she is able to drive.  Her other main symptoms are those of a right-sided "" leg pain which she has had for many years.  The exact etiology of this remains unclear at this time for most of her work-up has been in [place].  No medical records are [place] at this time for review.  She denies recent chest pain shortness of breath paroxysmal nocturnal dyspnea or pedal edema.  He denies any cough wheezing, or hemoptysis.  Her appetite is normal.  She has no difficulty swallowing liquids or solids.  Her weight is steady  She denies any abdominal pain  or recent diarrhea.  Denies any history of melena exact etiology of which which is unclear.  She does report wheezing with a gastroenterologist in [place] and having had colonoscopy.   She denies any symptoms of dysuria, hematuria or of polyuria.  . Past Medical History:  Diagnosis Date  . Acute renal failure ([place])   . Arthritis   . Diabetes mellitus without complication ([place])    type II   . Hypercalcemia   . Hyperlipidemia   . Hypertension   . Renal disorder   . Single kidney   . Thrombosis   . Tobacco abuse     Past Surgical History:  Procedure Laterality Date  . ABDOMINAL HYSTERECTOMY    . blood clots removed from descending aorta     . CHOLECYSTECTOMY    . COLONOSCOPY    . COLONOSCOPY WITH PROPOFOL N/A 04/17/2017   Procedure: COLONOSCOPY WITH PROPOFOL;  Surgeon: Wilford Corner, MD;  Location: [facility];  Service: Endoscopy;  Laterality: N/A;  . ESOPHAGOGASTRODUODENOSCOPY (EGD) WITH PROPOFOL N/A 04/17/2017   Procedure: ESOPHAGOGASTRODUODENOSCOPY (EGD) WITH PROPOFOL;  Surgeon: Wilford Corner, MD;  Location: [facility];  Service: Endoscopy;  Laterality: N/A;  . IR IMAGING GUIDED PORT INSERTION  05/01/2019  . NEPHRECTOMY RECIPIENT      Allergies: Contrast media [iodinated diagnostic agents]  Medications: Prior to Admission medications   Medication Sig Start Date End Date Taking? Authorizing Provider  allopurinol (ZYLOPRIM) 300 MG tablet Take 1 tablet (300 mg total) by mouth daily. 03/31/19   Dana Allan I, MD  amLODipine (NORVASC) 10 MG tablet Take 10 mg  by mouth daily.    [provider]  APPLE CIDER VINEGAR PO Take 1 tablet by mouth daily.    [provider]  aspirin EC 81 MG EC tablet Take 1 tablet (81 mg total) by mouth daily. Swallow whole. 11/22/19   Danford, Suann Larry, MD  atorvastatin (LIPITOR) 80 MG tablet Take 1 tablet (80 mg total) by mouth daily. 11/22/19   Danford, Suann Larry, MD  baclofen (LIORESAL) 10 MG tablet Take 10 mg  by mouth 3 (three) times daily as needed for muscle spasms.    [provider]  buprenorphine (BUTRANS) 20 MCG/HR PTWK Place 1 patch onto the skin once a week.  11/15/19   [provider]  Dulaglutide (TRULICITY) 1.5 BM/8.4XL SOPN Inject 1.5 mg into the skin once a week. Mondays    [provider]  ezetimibe (ZETIA) 10 MG tablet Take 10 mg by mouth daily.    [provider]  feeding supplement, ENSURE ENLIVE, (ENSURE ENLIVE) LIQD Take 237 mLs by mouth 2 (two) times daily between meals. 03/30/19   Bonnell Public, MD  furosemide (LASIX) 40 MG tablet Take 1 tablet (40 mg total) by mouth daily as needed. 05/20/19 05/19/20  O'NealCassie Freer, MD  gabapentin (NEURONTIN) 600 MG tablet Take 600 mg by mouth 4 (four) times daily.     [provider]  Insulin Glargine (TOUJEO SOLOSTAR) 300 UNIT/ML SOPN Inject 20 Units into the skin at bedtime.    [provider]  Ipratropium-Albuterol (COMBIVENT) 20-100 MCG/ACT AERS respimat Inhale 1 puff into the lungs every 6 (six) hours as needed for wheezing or shortness of breath. 03/17/17   Hongalgi, Lenis Dickinson, MD  levothyroxine (SYNTHROID) 75 MCG tablet Take 75 mcg by mouth daily. 02/28/19   [provider]  lidocaine-prilocaine (EMLA) cream Apply 1 application topically as needed. 07/03/19   Truitt Merle, MD  losartan (COZAAR) 25 MG tablet Take 1 tablet (25 mg total) by mouth daily. 11/22/19 11/21/20  Danford, Suann Larry, MD  metoprolol (LOPRESSOR) 50 MG tablet Take 50 mg by mouth 2 (two) times daily.  03/06/14   [provider]  oxyCODONE-acetaminophen (PERCOCET) 10-325 MG tablet Take 1 tablet by mouth every 6 (six) hours as needed for pain.    [provider]  pantoprazole (PROTONIX) 40 MG tablet Take 40 mg by mouth daily.    [provider]  pregabalin (LYRICA) 25 MG capsule Take 25 mg by mouth 2 (two) times daily.  01/24/19   [provider]  senna (SENOKOT) 8.6 MG TABS  tablet Take 1 tablet by mouth.    [provider]  Vitamin D, Ergocalciferol, (DRISDOL) 1.25 MG (50000 UNIT) CAPS capsule Take 50,000 Units by mouth every 7 (seven) days.    [provider]  XTAMPZA ER 27 MG C12A Take 1 capsule by mouth 2 (two) times daily. 02/27/19   [provider]     Family History  Problem Relation Age of Onset  . Hypertension Mother   . Diabetes Father   . Hypertension Brother   . Breast cancer Maternal Aunt        47s  . Breast cancer Maternal Aunt 75       colon cancer    Social History   Socioeconomic History  . Marital status: Single    Spouse name: Not on file  . Number of children: 3  . Years of education: Not on file  . Highest education level: Not on file  Occupational History  .  Occupation: retired   Tobacco Use  . Smoking status: Current Every Day Smoker    Packs/day: 0.25    Years: 40.00    Pack years: 10.00    Types: Cigarettes  . Smokeless tobacco: Never Used  Vaping Use  . Vaping Use: Never used  Substance and Sexual Activity  . Alcohol use: Yes  . Drug use: No  . Sexual activity: Not on file  Other Topics Concern  . Not on file  Social History Narrative   Lives with daughter    Dorie Rank from [place]    Social Determinants of Health   Financial Resource Strain:   . Difficulty of Paying Living Expenses: Not on file  Food Insecurity:   . Worried About Charity fundraiser in the Last Year: Not on file  . Ran Out of Food in the Last Year: Not on file  Transportation Needs:   . Lack of Transportation (Medical): Not on file  . Lack of Transportation (Non-Medical): Not on file  Physical Activity:   . Days of Exercise per Week: Not on file  . Minutes of Exercise per Session: Not on file  Stress:   . Feeling of Stress : Not on file  Social Connections:   . Frequency of Communication with Friends and Family: Not on file  . Frequency of Social Gatherings with Friends and Family: Not on file  . Attends  Religious Services: Not on file  . Active Member of Clubs or Organizations: Not on file  . Attends Archivist Meetings: Not on file  . Marital Status: Not on file     Review of Systems: A 12 point ROS discussed and pertinent positives are indicated in the HPI above.  All other systems are negative.  Review of Systems  As above  Vital Signs: There were no vitals taken for this visit.  Physical Exam  Appears in no acute distress.  Affect appropriate.  Normal eye contact.  Alert awake appropriately responsive.  Oriented to time place space.  Speech and comprehension normal.  Intermittent word finding difficulty though able to make complete sentences most of the time.  Alert grossly no lateralizing neurologic findings.  Station and gait intact with good balance during the brief encounter at the time of examination.   Imaging: [name] previous studies were evaluated in the electronic medical records.  A left middle cerebral artery bifurcation aneurysm measuring approximately 5.5 mm x 3.7 mm is noted.  Small focal outpouching from the supraclinoid left ICA measuring proximal 2 mm is also identified.  No growth at maladies seen otherwise of the remaining vessel of the circle of Willis on images provided.   Labs:  CBC: Recent Labs    12/17/19 0836 12/30/19 1118 01/10/20 1022 01/24/20 1212  WBC 7.9 11.8* 10.1 8.8  HGB 10.8* 10.8* 9.6* 10.6*  HCT 36.1 36.5 32.7* 36.3  PLT 268 259 276 247    COAGS: Recent Labs    05/01/19 1330 11/19/19 1317  INR 0.8 1.0  APTT  --  26    BMP: Recent Labs    11/19/19 1317 11/20/19 0433 11/22/19 1139 01/24/20 1212  NA 137 139 136 139  K 4.2 4.2 4.2 4.6  CL 100 103 103 107  CO2 26 26 25 23   GLUCOSE 184* 131* 169* 232*  BUN 20 16 12  26*  CALCIUM 9.9 9.9 9.8 10.8*  CREATININE 1.56* 1.01*  1.04* 1.03* 1.21*  GFRNONAA 34* 57*  55* 55* 46*  GFRAA 39* >  60  >60 >60 53*    LIVER FUNCTION TESTS: Recent Labs     03/28/19 0351 07/19/19 1340 11/19/19 1317 01/24/20 1212  BILITOT 0.5 0.5 0.7 0.3  AST 13* 77* 20 28  ALT 9 119* 23 56*  ALKPHOS 144* 330* 159* 237*  PROT 6.1* 7.4 6.9 7.2  ALBUMIN 2.7* 3.9 3.9 3.9    TUMOR MARKERS: No results for input(s): AFPTM, CEA, CA199, CHROMGRNA in the last 8760 hours.  Assessment and Plan: Patient denies any familial history of brain aneurysms or of abdominal aneurysms.  The natural history of unruptured brain aneurysms was reviewed.  Patient was informed of the incidental finding as being probably not responsible for the patients ischemic stroke recently.  Risk of rupture 1 to 2 %/year with increased risk associated with hypertension, smoking, possible hyperlipidemia and female gender were reviewed.  In the event of rupture, mortality of 50 to 60%, and significant morbidity related to treatment or effect of subarachnoid hemorrhage was reviewed.  Management considerations were levels of continue medical management with close surveillance with MRIs, versus consideration of elimination of the aneurysm from circulation via endovascular means.  I briefly reviewed options of primary coiling versus stent assisted coiling versus use of an intracellular flow disrupter device.  However prior to finalizing, t a formal catheter arteriogram would be needed to accurately evaluate the angio architecture of the aneurysm itself to [facility] the most appropriate endovascular treatment per  The procedure of angiography was reviewed.  Remote risk of failure ischemic stroke 1 in1000, was discussed. The patient has expressed to proceed with a diagnostic arteriogram first.   Questions answered and concerns addressed. Patient conveys understanding and agrees with plan.  She was informed that a scheduler will call to set up the diagnostic arteriogram.  Thank you for this interesting consult.  I greatly enjoyed meeting Bethany [name] and look forward to participating in their care.  A  copy of this report was sent to the requesting provider on this date.  Electronically Signed: Rob Hickman, MD 01/28/2020, 2:38 PM   I spent a total of 40 minutes in face-to-face clinical consultation to discuss the natural history of brain aneurysms, risk factors regarding rupture risk and post rupture management.  Time also spent reviewing patient's medical electronic chart, and imaging findings.

## 2020-01-29 ENCOUNTER — Telehealth (HOSPITAL_COMMUNITY): Payer: Self-pay | Admitting: Radiology

## 2020-01-29 ENCOUNTER — Other Ambulatory Visit (HOSPITAL_COMMUNITY): Payer: Self-pay | Admitting: Interventional Radiology

## 2020-01-29 DIAGNOSIS — I671 Cerebral aneurysm, nonruptured: Secondary | ICD-10-CM

## 2020-01-29 NOTE — Telephone Encounter (Signed)
Called pt, left VM for her to call to schedule cerebral angio with Deveshwar. JM

## 2020-02-04 ENCOUNTER — Telehealth: Payer: Self-pay | Admitting: Student

## 2020-02-04 ENCOUNTER — Other Ambulatory Visit: Payer: Self-pay | Admitting: Radiology

## 2020-02-04 NOTE — Telephone Encounter (Signed)
NIR.   Patient is scheduled for an image-guided diagnostic cerebral arteriogram 02/06/2020. She has an iodine allergy causing hives, requiring pre-medications for procedures with iodine dye.   Faxed filled prescriptions to [facility] [phone]) at 1546: 1- Prednisone 50 mg tablets; take one tablet by mouth 13 hours, 7 hours, and 1 hour prior to procedure on 02/06/2020; dispense 3 tablets with 0 refills. 2- Benadryl 50 mg tablets; take one tablet by mouth 1 hour prior to procedure on 02/06/2020; dispense 1 tablet with 0 refills.  Schedulers to call patient to let know prescriptions sent. Please call NIR with questions/concerns.  Bea Graff Jamala Kohen, PA-C 02/04/2020, 3:57 PM

## 2020-02-05 ENCOUNTER — Other Ambulatory Visit: Payer: Self-pay | Admitting: Radiology

## 2020-02-05 ENCOUNTER — Other Ambulatory Visit: Payer: Self-pay | Admitting: Student

## 2020-02-06 ENCOUNTER — Other Ambulatory Visit (HOSPITAL_COMMUNITY): Payer: Self-pay | Admitting: Interventional Radiology

## 2020-02-06 ENCOUNTER — Telehealth: Payer: Self-pay | Admitting: Hematology

## 2020-02-06 ENCOUNTER — Ambulatory Visit (HOSPITAL_COMMUNITY)
Admission: RE | Admit: 2020-02-06 | Discharge: 2020-02-06 | Disposition: A | Discharge status: Home / Self Care | Payer: Medicare Other | Source: Ambulatory Visit | Attending: Interventional Radiology | Admitting: Interventional Radiology

## 2020-02-06 ENCOUNTER — Other Ambulatory Visit: Payer: Self-pay

## 2020-02-06 DIAGNOSIS — I10 Essential (primary) hypertension: Secondary | ICD-10-CM | POA: Diagnosis not present

## 2020-02-06 DIAGNOSIS — I671 Cerebral aneurysm, nonruptured: Secondary | ICD-10-CM

## 2020-02-06 DIAGNOSIS — Z7982 Long term (current) use of aspirin: Secondary | ICD-10-CM | POA: Diagnosis not present

## 2020-02-06 DIAGNOSIS — E119 Type 2 diabetes mellitus without complications: Secondary | ICD-10-CM | POA: Insufficient documentation

## 2020-02-06 DIAGNOSIS — Z905 Acquired absence of kidney: Secondary | ICD-10-CM | POA: Insufficient documentation

## 2020-02-06 DIAGNOSIS — F17210 Nicotine dependence, cigarettes, uncomplicated: Secondary | ICD-10-CM | POA: Insufficient documentation

## 2020-02-06 DIAGNOSIS — Z794 Long term (current) use of insulin: Secondary | ICD-10-CM | POA: Insufficient documentation

## 2020-02-06 DIAGNOSIS — Z8673 Personal history of transient ischemic attack (TIA), and cerebral infarction without residual deficits: Secondary | ICD-10-CM | POA: Diagnosis not present

## 2020-02-06 DIAGNOSIS — Z8249 Family history of ischemic heart disease and other diseases of the circulatory system: Secondary | ICD-10-CM | POA: Diagnosis not present

## 2020-02-06 DIAGNOSIS — Z86718 Personal history of other venous thrombosis and embolism: Secondary | ICD-10-CM | POA: Insufficient documentation

## 2020-02-06 DIAGNOSIS — N179 Acute kidney failure, unspecified: Secondary | ICD-10-CM | POA: Diagnosis not present

## 2020-02-06 DIAGNOSIS — E785 Hyperlipidemia, unspecified: Secondary | ICD-10-CM | POA: Diagnosis not present

## 2020-02-06 DIAGNOSIS — Z833 Family history of diabetes mellitus: Secondary | ICD-10-CM | POA: Insufficient documentation

## 2020-02-06 DIAGNOSIS — M1990 Unspecified osteoarthritis, unspecified site: Secondary | ICD-10-CM | POA: Insufficient documentation

## 2020-02-06 DIAGNOSIS — Z90710 Acquired absence of both cervix and uterus: Secondary | ICD-10-CM | POA: Diagnosis not present

## 2020-02-06 DIAGNOSIS — Z91041 Radiographic dye allergy status: Secondary | ICD-10-CM | POA: Insufficient documentation

## 2020-02-06 DIAGNOSIS — Z79899 Other long term (current) drug therapy: Secondary | ICD-10-CM | POA: Insufficient documentation

## 2020-02-06 DIAGNOSIS — Z79890 Hormone replacement therapy (postmenopausal): Secondary | ICD-10-CM | POA: Insufficient documentation

## 2020-02-06 LAB — CBC WITH DIFFERENTIAL/PLATELET
Abs Immature Granulocytes: 0.07 10*3/uL (ref 0.00–0.07)
Basophils Absolute: 0 10*3/uL (ref 0.0–0.1)
Basophils Relative: 0 %
Eosinophils Absolute: 0 10*3/uL (ref 0.0–0.5)
Eosinophils Relative: 0 %
HCT: 37.1 % (ref 36.0–46.0)
Hemoglobin: 10.6 g/dL — ABNORMAL LOW (ref 12.0–15.0)
Immature Granulocytes: 1 %
Lymphocytes Relative: 6 %
Lymphs Abs: 0.7 10*3/uL (ref 0.7–4.0)
MCH: 24.3 pg — ABNORMAL LOW (ref 26.0–34.0)
MCHC: 28.6 g/dL — ABNORMAL LOW (ref 30.0–36.0)
MCV: 85.1 fL (ref 80.0–100.0)
Monocytes Absolute: 0.1 10*3/uL (ref 0.1–1.0)
Monocytes Relative: 0 %
Neutro Abs: 11 10*3/uL — ABNORMAL HIGH (ref 1.7–7.7)
Neutrophils Relative %: 93 %
Platelets: 313 10*3/uL (ref 150–400)
RBC: 4.36 MIL/uL (ref 3.87–5.11)
RDW: 15.9 % — ABNORMAL HIGH (ref 11.5–15.5)
WBC: 11.8 10*3/uL — ABNORMAL HIGH (ref 4.0–10.5)
nRBC: 0 % (ref 0.0–0.2)

## 2020-02-06 LAB — POCT I-STAT, CHEM 8
BUN: 31 mg/dL — ABNORMAL HIGH (ref 8–23)
Calcium, Ion: 1.37 mmol/L (ref 1.15–1.40)
Chloride: 108 mmol/L (ref 98–111)
Creatinine, Ser: 1.3 mg/dL — ABNORMAL HIGH (ref 0.44–1.00)
Glucose, Bld: 269 mg/dL — ABNORMAL HIGH (ref 70–99)
HCT: 36 % (ref 36.0–46.0)
Hemoglobin: 12.2 g/dL (ref 12.0–15.0)
Potassium: 5.4 mmol/L — ABNORMAL HIGH (ref 3.5–5.1)
Sodium: 142 mmol/L (ref 135–145)
TCO2: 26 mmol/L (ref 22–32)

## 2020-02-06 LAB — BASIC METABOLIC PANEL
Anion gap: 10 (ref 5–15)
BUN: 28 mg/dL — ABNORMAL HIGH (ref 8–23)
CO2: 24 mmol/L (ref 22–32)
Calcium: 10.4 mg/dL — ABNORMAL HIGH (ref 8.9–10.3)
Chloride: 103 mmol/L (ref 98–111)
Creatinine, Ser: 1.56 mg/dL — ABNORMAL HIGH (ref 0.44–1.00)
GFR calc Af Amer: 39 mL/min — ABNORMAL LOW (ref >60)
GFR calc non Af Amer: 34 mL/min — ABNORMAL LOW (ref >60)
Glucose, Bld: 410 mg/dL — ABNORMAL HIGH (ref 70–99)
Potassium: 6.4 mmol/L (ref 3.5–5.1)
Sodium: 137 mmol/L (ref 135–145)

## 2020-02-06 LAB — PROTIME-INR
INR: 1 (ref 0.8–1.2)
Prothrombin Time: 12.9 seconds (ref 11.4–15.2)

## 2020-02-06 LAB — GLUCOSE, CAPILLARY
Glucose-Capillary: 411 mg/dL — ABNORMAL HIGH (ref 70–99)
Glucose-Capillary: 347 mg/dL — ABNORMAL HIGH (ref 70–99)
Glucose-Capillary: 315 mg/dL — ABNORMAL HIGH (ref 70–99)
Glucose-Capillary: 240 mg/dL — ABNORMAL HIGH (ref 70–99)

## 2020-02-06 MED ORDER — INSULIN ASPART 100 UNIT/ML ~~LOC~~ SOLN
SUBCUTANEOUS | Status: AC
  Filled 2020-02-06: qty 1

## 2020-02-06 MED ORDER — SODIUM CHLORIDE 0.9 % IV BOLUS: 250.0000 mL | Freq: Once | INTRAVENOUS | Status: DC

## 2020-02-06 MED ORDER — VERAPAMIL HCL 2.5 MG/ML IV SOLN: INTRA_ARTERIAL | Status: AC | PRN

## 2020-02-06 MED ORDER — FENTANYL CITRATE (PF) 100 MCG/2ML IJ SOLN
INTRAMUSCULAR | Status: AC
  Filled 2020-02-06: qty 2

## 2020-02-06 MED ORDER — NITROGLYCERIN 1 MG/10 ML FOR IR/CATH LAB
INTRA_ARTERIAL | Status: AC
  Administered 2020-02-06: 200 ug via INTRA_ARTERIAL
  Filled 2020-02-06: qty 10

## 2020-02-06 MED ORDER — INSULIN ASPART 100 UNIT/ML ~~LOC~~ SOLN
10.0000 [IU] | Freq: Once | SUBCUTANEOUS | Status: AC
  Administered 2020-02-06: 10 [IU] via SUBCUTANEOUS

## 2020-02-06 MED ORDER — IOHEXOL 300 MG/ML  SOLN
50.0000 mL | Freq: Once | INTRAMUSCULAR | Status: AC | PRN
  Administered 2020-02-06: 10 mL via INTRA_ARTERIAL

## 2020-02-06 MED ORDER — INSULIN ASPART 100 UNIT/ML ~~LOC~~ SOLN
18.0000 [IU] | Freq: Once | SUBCUTANEOUS | Status: AC
  Administered 2020-02-06: 18 [IU] via SUBCUTANEOUS

## 2020-02-06 MED ORDER — IOHEXOL 300 MG/ML  SOLN
150.0000 mL | Freq: Once | INTRAMUSCULAR | Status: AC | PRN
  Administered 2020-02-06: 80 mL via INTRA_ARTERIAL

## 2020-02-06 MED ORDER — MIDAZOLAM HCL 2 MG/2ML IJ SOLN
INTRAMUSCULAR | Status: AC | PRN
  Administered 2020-02-06: 1 mg via INTRAVENOUS

## 2020-02-06 MED ORDER — LIDOCAINE HCL 1 % IJ SOLN
INTRAMUSCULAR | Status: AC
  Filled 2020-02-06: qty 20

## 2020-02-06 MED ORDER — VERAPAMIL HCL 2.5 MG/ML IV SOLN
INTRAVENOUS | Status: AC
  Filled 2020-02-06: qty 2

## 2020-02-06 MED ORDER — LIDOCAINE HCL (PF) 1 % IJ SOLN
INTRAMUSCULAR | Status: AC | PRN
  Administered 2020-02-06: 10 mL

## 2020-02-06 MED ORDER — HEPARIN SODIUM (PORCINE) 1000 UNIT/ML IJ SOLN
INTRAMUSCULAR | Status: AC
  Filled 2020-02-06: qty 1

## 2020-02-06 MED ORDER — SODIUM CHLORIDE 0.9 % IV SOLN: INTRAVENOUS | Status: AC

## 2020-02-06 MED ORDER — MIDAZOLAM HCL 2 MG/2ML IJ SOLN
INTRAMUSCULAR | Status: AC
  Filled 2020-02-06: qty 2

## 2020-02-06 MED ORDER — SODIUM CHLORIDE 0.9 % IV SOLN: INTRAVENOUS | Status: DC

## 2020-02-06 MED ORDER — FENTANYL CITRATE (PF) 100 MCG/2ML IJ SOLN
INTRAMUSCULAR | Status: AC | PRN
  Administered 2020-02-06: 25 ug via INTRAVENOUS

## 2020-02-06 MED ORDER — INSULIN ASPART 100 UNIT/ML ~~LOC~~ SOLN: 15.0000 [IU] | Freq: Once | SUBCUTANEOUS | Status: DC

## 2020-02-06 NOTE — Telephone Encounter (Signed)
Called and left msg about change in appts on 9/24.

## 2020-02-06 NOTE — Progress Notes (Signed)
Shannon<,Pa called and informed of Cbg of 347.

## 2020-02-06 NOTE — Progress Notes (Addendum)
Pt states that she took her pre meds as directed (benadryl and Prednisone). Shannon,PA called and informed of CBG of 411

## 2020-02-06 NOTE — Procedures (Signed)
S/P 4 vessel cerebral arteriogram RT Rad approach. Findings. 1.Approx 4.71mm x 3.29mm Lt MCA bifurcation aneurysm with a neck of ?2.18mm  2.probable 34mm x 68mm Lt ICA post wall Pcom region aneurysm. Persistent motion artifact S.Samamtha Tiegs MD

## 2020-02-06 NOTE — Progress Notes (Signed)
Dr. Corena Pilgrim notified that pt requested her Brother Bethany [name] be notified of the results of her procedure.

## 2020-02-06 NOTE — H&P (Signed)
 Chief Complaint: Patient was seen in consultation today for an image guided diagnostic cerebral angiogram using moderate sedation.  Referring Physician(s): Dr. Sethi  Supervising Physician: Deveshwar, Sanjeev  Patient Status: [facility] - Out-pt  History of Present Illness: Bethany [name] is a 68 y.o. female with a past medical history significant for arthritis, HLD, HTN, DM and ischemic stroke (11/16/19) who presents today for a diagnostic cerebral angiogram with moderate sedation. Bethany [name] was seen in consultation with Dr. Deveshwar on 01/28/20 - please see this note for full details. 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 After thorough discussion she has decided to proceed with the diagnostic angiogram today in order to plan for possible future NIR intervention.  Bethany [name] reports ongoing right leg pain, mostly behind her right knee which she describes as "feeling like a rubber band is too tight and about to snap." She also has some numbness and tingling in her right foot at times. She denies any swelling, temperature change or color change. She also reports significant numbness in her right hand which causes her to drop objects she is holding. She also feels that this numbness is starting to occur in her left hand. She often has tension in her neck, mostly on her right side, which is sometimes relieved with rest but other times lingers for several days. She denies any headaches, dizziness or vision changes. She understands the procedure today and is agreeable to proceed. I also discussed the procedure today with her brother Alfred per her request.   Past Medical History:  Diagnosis Date  . Acute renal failure (HCC)   . Arthritis   . Diabetes mellitus without complication (HCC)    type II   . Hypercalcemia   .  Hyperlipidemia   . Hypertension   . Renal disorder   . Single kidney   . Thrombosis   . Tobacco abuse     Past Surgical History:  Procedure Laterality Date  . ABDOMINAL HYSTERECTOMY    . blood clots removed from descending aorta     . CHOLECYSTECTOMY    . COLONOSCOPY    . COLONOSCOPY WITH PROPOFOL N/A 04/17/2017   Procedure: COLONOSCOPY WITH PROPOFOL;  Surgeon: Schooler, Vincent, MD;  Location: [facility];  Service: Endoscopy;  Laterality: N/A;  . ESOPHAGOGASTRODUODENOSCOPY (EGD) WITH PROPOFOL N/A 04/17/2017   Procedure: ESOPHAGOGASTRODUODENOSCOPY (EGD) WITH PROPOFOL;  Surgeon: Schooler, Vincent, MD;  Location: [facility];  Service: Endoscopy;  Laterality: N/A;  . IR IMAGING GUIDED PORT INSERTION  05/01/2019  . NEPHRECTOMY RECIPIENT      Allergies: Contrast media [iodinated diagnostic agents]  Medications: Prior to Admission medications   Medication Sig Start Date End Date Taking? Authorizing Provider  allopurinol (ZYLOPRIM) 100 MG tablet Take 100 mg by mouth daily.   Yes [provider]  amLODipine (NORVASC) 10 MG tablet Take 10 mg by mouth daily.   Yes [provider]  APPLE CIDER VINEGAR PO Take 1 tablet by mouth daily.   Yes [provider]  aspirin EC 81 MG EC tablet Take 1 tablet (81 mg total) by mouth daily. Swallow whole. 11/22/19  Yes Danford, Christopher P, MD  atorvastatin (LIPITOR) 80 MG tablet Take 1 tablet (80 mg total) by mouth daily. 11/22/19  Yes Danford, Christopher P, MD  baclofen (LIORESAL) 10 MG tablet Take 10 mg by mouth 3 (three)   times daily as needed for muscle spasms.   Yes [provider]  buprenorphine (BUTRANS) 20 MCG/HR PTWK Place 1 patch onto the skin every Monday.  11/15/19  Yes [provider]  Dulaglutide (TRULICITY) 1.5 ZJ/6.9CV SOPN Inject 1.5 mg into the skin every Monday.    Yes [provider]  ezetimibe (ZETIA) 10 MG tablet Take 10 mg by mouth daily.   Yes [provider]  feeding  supplement, ENSURE ENLIVE, (ENSURE ENLIVE) LIQD Take 237 mLs by mouth 2 (two) times daily between meals. 03/30/19  Yes Dana Allan I, MD  furosemide (LASIX) 40 MG tablet Take 1 tablet (40 mg total) by mouth daily as needed. Patient taking differently: Take 40 mg by mouth daily as needed for fluid.  05/20/19 05/19/20 Yes O'Neal, Cassie Freer, MD  gabapentin (NEURONTIN) 600 MG tablet Take 600 mg by mouth 4 (four) times daily.    Yes [provider]  insulin glargine, 1 Unit Dial, (TOUJEO SOLOSTAR) 300 UNIT/ML Solostar Pen Inject 20 Units into the skin at bedtime.    Yes [provider]  Ipratropium-Albuterol (COMBIVENT) 20-100 MCG/ACT AERS respimat Inhale 1 puff into the lungs every 6 (six) hours as needed for wheezing or shortness of breath. 03/17/17  Yes Hongalgi, Lenis Dickinson, MD  levothyroxine (SYNTHROID) 75 MCG tablet Take 75 mcg by mouth daily. 02/28/19  Yes [provider]  lidocaine-prilocaine (EMLA) cream Apply 1 application topically as needed. Patient taking differently: Apply 1 application topically daily as needed (port access).  07/03/19  Yes Truitt Merle, MD  losartan (COZAAR) 25 MG tablet Take 1 tablet (25 mg total) by mouth daily. 11/22/19 11/21/20 Yes Danford, Suann Larry, MD  metoprolol (LOPRESSOR) 50 MG tablet Take 50 mg by mouth 2 (two) times daily.  03/06/14  Yes [provider]  Multiple Vitamin (MULTIVITAMIN WITH MINERALS) TABS tablet Take 1 tablet by mouth daily.   Yes [provider]  NARCAN 4 MG/0.1ML LIQD nasal spray kit Place 1 spray into the nose as needed for opioid reversal. 08/22/19  Yes [provider]  oxyCODONE-acetaminophen (PERCOCET) 10-325 MG tablet Take 1 tablet by mouth every 6 (six) hours as needed (breakthrough pain).    Yes [provider]  pantoprazole (PROTONIX) 40 MG tablet Take 40 mg by mouth daily.   Yes [provider]  pregabalin (LYRICA) 25 MG capsule Take 25 mg by mouth 2 (two) times daily.   01/24/19  Yes [provider]  senna (SENOKOT) 8.6 MG TABS tablet Take 8.6 mg by mouth daily.    Yes [provider]  Vitamin D, Ergocalciferol, (DRISDOL) 1.25 MG (50000 UNIT) CAPS capsule Take 50,000 Units by mouth every Monday.    Yes [provider]  XTAMPZA ER 27 MG C12A Take 27 mg by mouth 2 (two) times daily.  02/27/19  Yes [provider]  allopurinol (ZYLOPRIM) 300 MG tablet Take 1 tablet (300 mg total) by mouth daily. Patient not taking: Reported on 02/05/2020 03/31/19   Bonnell Public, MD     Family History  Problem Relation Age of Onset  . Hypertension Mother   . Diabetes Father   . Hypertension Brother   . Breast cancer Maternal Aunt        51s  . Breast cancer Maternal Aunt 75       colon cancer    Social History   Socioeconomic History  . Marital status: Single    Spouse name: Not on file  . Number of children:  3  . Years of education: Not on file  . Highest education level: Not on file  Occupational History  . Occupation: retired   Tobacco Use  . Smoking status: Current Every Day Smoker    Packs/day: 0.25    Years: 40.00    Pack years: 10.00    Types: Cigarettes  . Smokeless tobacco: Never Used  Vaping Use  . Vaping Use: Never used  Substance and Sexual Activity  . Alcohol use: Yes  . Drug use: No  . Sexual activity: Not on file  Other Topics Concern  . Not on file  Social History Narrative   Lives with daughter    Moved from [place]    Social Determinants of Health   Financial Resource Strain:   . Difficulty of Paying Living Expenses: Not on file  Food Insecurity:   . Worried About Running Out of Food in the Last Year: Not on file  . Ran Out of Food in the Last Year: Not on file  Transportation Needs:   . Lack of Transportation (Medical): Not on file  . Lack of Transportation (Non-Medical): Not on file  Physical Activity:   . Days of Exercise per Week: Not on file  . Minutes of Exercise per Session: Not on  file  Stress:   . Feeling of Stress : Not on file  Social Connections:   . Frequency of Communication with Friends and Family: Not on file  . Frequency of Social Gatherings with Friends and Family: Not on file  . Attends Religious Services: Not on file  . Active Member of Clubs or Organizations: Not on file  . Attends Club or Organization Meetings: Not on file  . Marital Status: Not on file     Review of Systems: A 12 point ROS discussed and pertinent positives are indicated in the HPI above.  All other systems are negative.  Review of Systems  Constitutional: Negative for appetite change, chills and fever.  HENT: Negative for trouble swallowing.   Eyes: Negative for visual disturbance.  Respiratory: Negative for cough and shortness of breath.   Cardiovascular: Negative for chest pain and leg swelling.  Gastrointestinal: Negative for abdominal pain, nausea and vomiting.  Musculoskeletal: Positive for arthralgias, myalgias, neck pain and neck stiffness. Negative for gait problem and joint swelling.  Skin: Negative for color change, rash and wound.  Neurological: Positive for weakness and numbness. Negative for dizziness, tremors, facial asymmetry, light-headedness and headaches.    Vital Signs: BP (!) 141/62   Pulse 62   Temp 98.7 F (37.1 C) (Oral)   Resp 18   Ht 5' 5" (1.651 m)   Wt 176 lb (79.8 kg)   SpO2 100%   BMI 29.29 kg/m   Physical Exam Vitals reviewed.  Constitutional:      General: She is not in acute distress. HENT:     Head: Normocephalic.     Mouth/Throat:     Mouth: Mucous membranes are moist.     Pharynx: Oropharynx is clear. No oropharyngeal exudate or posterior oropharyngeal erythema.  Cardiovascular:     Rate and Rhythm: Normal rate and regular rhythm.  Pulmonary:     Effort: Pulmonary effort is normal.     Breath sounds: Normal breath sounds.  Abdominal:     General: There is no distension.     Palpations: Abdomen is soft.     Tenderness:  There is no abdominal tenderness.  Skin:    General: Skin is warm and dry.    Neurological:     Mental Status: She is alert and oriented to person, place, and time.  Psychiatric:        Mood and Affect: Mood normal.        Behavior: Behavior normal.        Thought Content: Thought content normal.        Judgment: Judgment normal.      MD Evaluation Airway: WNL Heart: WNL Abdomen: WNL Chest/ Lungs: WNL ASA  Classification: 3 Mallampati/Airway Score: Two   Imaging: No results found.  Labs:  CBC: Recent Labs    12/30/19 1118 01/10/20 1022 01/24/20 1212 02/06/20 0718  WBC 11.8* 10.1 8.8 11.8*  HGB 10.8* 9.6* 10.6* 10.6*  HCT 36.5 32.7* 36.3 37.1  PLT 259 276 247 313    COAGS: Recent Labs    05/01/19 1330 11/19/19 1317  INR 0.8 1.0  APTT  --  26    BMP: Recent Labs    11/19/19 1317 11/20/19 0433 11/22/19 1139 01/24/20 1212  NA 137 139 136 139  K 4.2 4.2 4.2 4.6  CL 100 103 103 107  CO2 26 26 25 23  GLUCOSE 184* 131* 169* 232*  BUN 20 16 12 26*  CALCIUM 9.9 9.9 9.8 10.8*  CREATININE 1.56* 1.01*  1.04* 1.03* 1.21*  GFRNONAA 34* 57*  55* 55* 46*  GFRAA 39* >60  >60 >60 53*    LIVER FUNCTION TESTS: Recent Labs    03/28/19 0351 07/19/19 1340 11/19/19 1317 01/24/20 1212  BILITOT 0.5 0.5 0.7 0.3  AST 13* 77* 20 28  ALT 9 119* 23 56*  ALKPHOS 144* 330* 159* 237*  PROT 6.1* 7.4 6.9 7.2  ALBUMIN 2.7* 3.9 3.9 3.9    TUMOR MARKERS: No results for input(s): AFPTM, CEA, CA199, CHROMGRNA in the last 8760 hours.  Assessment and Plan:  68 y/o F with history of ischemic stroke found to have incidental left MCA bifurcation aneurysm during workup who presents today for a diagnostic cerebral angiogram to plan for possible future NIR intervention.   Patient has been NPO since midnight, she took her contrast allergy pre-medications as instructed. Afebrile, WBC 11.8, hgb 10.6, plt 313, INR 1.0, K+ 6.4 (possible hemolysis - will redraw), creatinine 1.56,  CBG 411 - given 18 units novolog with repeat CBG 30 minutes post administration 342, will give additional 15 units novolog now and repeat CBG in 30 minutes. Per Dr. Deveshwar CBG to be <300 prior to proceeding.  Risks and benefits of diagnostic cerebral angiogram were discussed with the patient including, but not limited to bleeding, infection, vascular injury, stroke, or contrast induced renal failure.  This interventional procedure involves the use of X-rays and because of the nature of the planned procedure, it is possible that we will have prolonged use of X-ray fluoroscopy.  Potential radiation risks to you include (but are not limited to) the following: - A slightly elevated risk for cancer  several years later in life. This risk is typically less than 0.5% percent. This risk is low in comparison to the normal incidence of human cancer, which is 33% for women and 50% for men according to the [employer] Society. - Radiation induced injury can include skin redness, resembling a rash, tissue breakdown / ulcers and hair loss (which can be temporary or permanent).  The likelihood of either of these occurring depends on the difficulty of the procedure and whether you are sensitive to radiation due to previous procedures, disease, or genetic conditions.  IF   your procedure requires a prolonged use of radiation, you will be notified and given written instructions for further action.  It is your responsibility to monitor the irradiated area for the 2 weeks following the procedure and to notify your physician if you are concerned that you have suffered a radiation induced injury.    All of the patient's questions were answered, patient is agreeable to proceed.  Consent signed and in chart.  Thank you for this interesting consult.  I greatly enjoyed meeting Bethany [name] and look forward to participating in their care.  A copy of this report was sent to the requesting provider on this  date.  Electronically Signed: Joaquim Nam, PA-C 02/06/2020, 8:21 AM   I spent a total of   25 Minutes in face to face in clinical consultation, greater than 50% of which was counseling/coordinating care for diagnostic cerebral angiogram.

## 2020-02-06 NOTE — Progress Notes (Signed)
Phone call to lab to see if pt labs were in process. Lab states they do not have specimen for this pt.  Phone call to [facility] to see if the second set of labs were sent to the lab, secretary confirms they were sent to lab. Phone call again to lab, they state that there are no specimens for this pt in the lab to be run. Dr Estanislado Pandy aware, verbal order received for istat chem 8.

## 2020-02-06 NOTE — Discharge Instructions (Signed)
Radial Site Care  This sheet gives you information about how to care for yourself after your procedure. Your health care provider may also give you more specific instructions. If you have problems or questions, contact your health care provider. What can I expect after the procedure? After the procedure, it is common to have:  Bruising and tenderness at the catheter insertion area. Follow these instructions at home: Medicines  Take over-the-counter and prescription medicines only as told by your health care provider. Insertion site care  Follow instructions from your health care provider about how to take care of your insertion site. Make sure you: ? Wash your hands with soap and water before you change your bandage (dressing). If soap and water are not available, use hand sanitizer. ? Change your dressing as told by your health care provider. ? Leave stitches (sutures), skin glue, or adhesive strips in place. These skin closures may need to stay in place for 2 weeks or longer. If adhesive strip edges start to loosen and curl up, you may trim the loose edges. Do not remove adhesive strips completely unless your health care provider tells you to do that.  Check your insertion site every day for signs of infection. Check for: ? Redness, swelling, or pain. ? Fluid or blood. ? Pus or a bad smell. ? Warmth.  Do not take baths, swim, or use a hot tub until your health care provider approves.  You may shower 24-48 hours after the procedure, or as directed by your health care provider. ? Remove the dressing and gently wash the site with plain soap and water. ? Pat the area dry with a clean towel. ? Do not rub the site. That could cause bleeding.  Do not apply powder or lotion to the site. Activity   For 24 hours after the procedure, or as directed by your health care provider: ? Do not flex or bend the affected arm. ? Do not push or pull heavy objects with the affected arm. ? Do not  drive yourself home from the hospital or clinic. You may drive 24 hours after the procedure unless your health care provider tells you not to. ? Do not operate machinery or power tools.  Do not lift anything that is heavier than 10 lb (4.5 kg), or the limit that you are told, until your health care provider says that it is safe.  Ask your health care provider when it is okay to: ? Return to work or school. ? Resume usual physical activities or sports. ? Resume sexual activity. General instructions  If the catheter site starts to bleed, raise your arm and put firm pressure on the site. If the bleeding does not stop, get help right away. This is a medical emergency.  If you went home on the same day as your procedure, a responsible adult should be with you for the first 24 hours after you arrive home.  Keep all follow-up visits as told by your health care provider. This is important. Contact a health care provider if:  You have a fever.  You have redness, swelling, or yellow drainage around your insertion site. Get help right away if:  You have unusual pain at the radial site.  The catheter insertion area swells very fast.  The insertion area is bleeding, and the bleeding does not stop when you hold steady pressure on the area.  Your arm or hand becomes pale, cool, tingly, or numb. These symptoms may represent a serious problem   that is an emergency. Do not wait to see if the symptoms will go away. Get medical help right away. Call your local emergency services (911 in the [place] Do not drive yourself to the hospital. Summary  After the procedure, it is common to have bruising and tenderness at the site.  Follow instructions from your health care provider about how to take care of your radial site wound. Check the wound every day for signs of infection.  Do not lift anything that is heavier than 10 lb (4.5 kg), or the limit that you are told, until your health care provider says  that it is safe. This information is not intended to replace advice given to you by your health care provider. Make sure you discuss any questions you have with your health care provider. Document Revised: 06/07/2017 Document Reviewed: 06/07/2017 Elsevier Patient Education  2020 [employer]  

## 2020-02-06 NOTE — Progress Notes (Signed)
Larene Beach called and inform of CBG new orders noted

## 2020-02-07 ENCOUNTER — Inpatient Hospital Stay: Payer: Medicare Other

## 2020-02-07 ENCOUNTER — Other Ambulatory Visit: Payer: Medicare Other

## 2020-02-07 ENCOUNTER — Other Ambulatory Visit: Payer: Self-pay

## 2020-02-07 ENCOUNTER — Inpatient Hospital Stay: Payer: Medicare Other | Admitting: Hematology

## 2020-02-07 ENCOUNTER — Inpatient Hospital Stay (HOSPITAL_BASED_OUTPATIENT_CLINIC_OR_DEPARTMENT_OTHER): Payer: Medicare Other | Admitting: Hematology

## 2020-02-07 ENCOUNTER — Ambulatory Visit: Payer: Medicare Other

## 2020-02-07 VITALS — BP 114/47 | HR 51 | Resp 18

## 2020-02-07 VITALS — BP 115/64 | HR 58 | Temp 97.9°F | Resp 16 | Ht 65.0 in | Wt 177.0 lb

## 2020-02-07 DIAGNOSIS — D500 Iron deficiency anemia secondary to blood loss (chronic): Secondary | ICD-10-CM

## 2020-02-07 DIAGNOSIS — K5520 Angiodysplasia of colon without hemorrhage: Secondary | ICD-10-CM

## 2020-02-07 DIAGNOSIS — Z95828 Presence of other vascular implants and grafts: Secondary | ICD-10-CM

## 2020-02-07 DIAGNOSIS — Q2733 Arteriovenous malformation of digestive system vessel: Secondary | ICD-10-CM | POA: Diagnosis not present

## 2020-02-07 LAB — CBC WITH DIFFERENTIAL (CANCER CENTER ONLY)
Abs Immature Granulocytes: 0.05 10*3/uL (ref 0.00–0.07)
Basophils Absolute: 0 10*3/uL (ref 0.0–0.1)
Basophils Relative: 0 %
Eosinophils Absolute: 0.1 10*3/uL (ref 0.0–0.5)
Eosinophils Relative: 1 %
HCT: 34.9 % — ABNORMAL LOW (ref 36.0–46.0)
Hemoglobin: 10.5 g/dL — ABNORMAL LOW (ref 12.0–15.0)
Immature Granulocytes: 0 %
Lymphocytes Relative: 11 %
Lymphs Abs: 1.6 10*3/uL (ref 0.7–4.0)
MCH: 24.8 pg — ABNORMAL LOW (ref 26.0–34.0)
MCHC: 30.1 g/dL (ref 30.0–36.0)
MCV: 82.5 fL (ref 80.0–100.0)
Monocytes Absolute: 0.7 10*3/uL (ref 0.1–1.0)
Monocytes Relative: 5 %
Neutro Abs: 12.2 10*3/uL — ABNORMAL HIGH (ref 1.7–7.7)
Neutrophils Relative %: 83 %
Platelet Count: 277 10*3/uL (ref 150–400)
RBC: 4.23 MIL/uL (ref 3.87–5.11)
RDW: 15.6 % — ABNORMAL HIGH (ref 11.5–15.5)
WBC Count: 14.6 10*3/uL — ABNORMAL HIGH (ref 4.0–10.5)
nRBC: 0 % (ref 0.0–0.2)

## 2020-02-07 MED ORDER — SODIUM CHLORIDE 0.9% FLUSH
10.0000 mL | Freq: Once | INTRAVENOUS | Status: AC
  Administered 2020-02-07: 10 mL via INTRAVENOUS
  Filled 2020-02-07: qty 10

## 2020-02-07 MED ORDER — HEPARIN SOD (PORK) LOCK FLUSH 100 UNIT/ML IV SOLN
500.0000 [IU] | Freq: Once | INTRAVENOUS | Status: AC
  Administered 2020-02-07: 500 [IU] via INTRAVENOUS
  Filled 2020-02-07: qty 5

## 2020-02-07 MED ORDER — SODIUM CHLORIDE 0.9 % IV SOLN
INTRAVENOUS | Status: DC
  Filled 2020-02-07 (×2): qty 250

## 2020-02-07 MED ORDER — SODIUM CHLORIDE 0.9 % IV SOLN
200.0000 mg | Freq: Once | INTRAVENOUS | Status: AC
  Administered 2020-02-07: 200 mg via INTRAVENOUS
  Filled 2020-02-07: qty 200

## 2020-02-07 NOTE — Patient Instructions (Signed)

## 2020-02-07 NOTE — Patient Instructions (Signed)

## 2020-02-07 NOTE — Progress Notes (Signed)
[place]   [phone] [phone]) [phone]   Clinic Follow up Note   Patient Care Team: Wenda Low, MD as PCP - General (Internal Medicine) O'Neal, Cassie Freer, MD as PCP - Cardiology (Cardiology) Truitt Merle, MD as Consulting Physician (Hematology) Angelia Mould, MD as Consulting Physician (Vascular Surgery) Koleen Distance, MD as Referring Physician (Gastroenterology)  Date of Service:  02/07/2020  CHIEF COMPLAINT: f/u of anemia   CURRENT THERAPY:  iv Feraheme as needed (if ferritin <100). Changed to IV Venofer on 01/02/19  INTERVAL HISTORY:  Bethany [name] is here for a follow up of anemia. She presents to the clinic alone. She notes she is recovering well from her stroke in 11/2019. She notes yesterday she had procedure to monitor her brain aneurysm and she was found to have a second aneurysm. She has right arm in sling today from procedure. She plans to have capsule endoscopy with GI soon. She is willing to continually have increased dose Venofer.  REVIEW OF SYSTEMS:   Constitutional: Denies fevers, chills or abnormal weight loss Eyes: Denies blurriness of vision Ears, nose, mouth, throat, and face: Denies mucositis or sore throat Respiratory: Denies cough, dyspnea or wheezes Cardiovascular: Denies palpitation, chest discomfort or lower extremity swelling Gastrointestinal:  Denies nausea, heartburn or change in bowel habits Skin: Denies abnormal skin rashes Lymphatics: Denies new lymphadenopathy or easy bruising Neurological:Denies numbness, tingling or new weaknesses Behavioral/Psych: Mood is stable, no new changes  All other systems were reviewed with the patient and are negative.  MEDICAL HISTORY:  Past Medical History:  Diagnosis Date  . Acute renal failure ([place])   . Arthritis   . Diabetes mellitus without complication ([place])    type II   . Hypercalcemia   . Hyperlipidemia   . Hypertension   . Renal disorder   . Single  kidney   . Thrombosis   . Tobacco abuse     SURGICAL HISTORY: Past Surgical History:  Procedure Laterality Date  . ABDOMINAL HYSTERECTOMY    . blood clots removed from descending aorta     . CHOLECYSTECTOMY    . COLONOSCOPY    . COLONOSCOPY WITH PROPOFOL N/A 04/17/2017   Procedure: COLONOSCOPY WITH PROPOFOL;  Surgeon: Wilford Corner, MD;  Location: [facility];  Service: Endoscopy;  Laterality: N/A;  . ESOPHAGOGASTRODUODENOSCOPY (EGD) WITH PROPOFOL N/A 04/17/2017   Procedure: ESOPHAGOGASTRODUODENOSCOPY (EGD) WITH PROPOFOL;  Surgeon: Wilford Corner, MD;  Location: [facility];  Service: Endoscopy;  Laterality: N/A;  . IR ANGIO INTRA EXTRACRAN SEL COM CAROTID INNOMINATE BILAT MOD SED  02/06/2020  . IR ANGIO VERTEBRAL SEL VERTEBRAL BILAT MOD SED  02/06/2020  . IR IMAGING GUIDED PORT INSERTION  05/01/2019  . IR US GUIDE VASC ACCESS RIGHT  02/06/2020  . NEPHRECTOMY RECIPIENT      I have reviewed the social history and family history with the patient and they are unchanged from previous note.  ALLERGIES:  is allergic to contrast media [iodinated diagnostic agents].  MEDICATIONS:  Current Outpatient Medications  Medication Sig Dispense Refill  . allopurinol (ZYLOPRIM) 100 MG tablet Take 100 mg by mouth daily.    Marland Kitchen amLODipine (NORVASC) 10 MG tablet Take 10 mg by mouth daily.    . APPLE CIDER VINEGAR PO Take 1 tablet by mouth daily.    Marland Kitchen aspirin EC 81 MG EC tablet Take 1 tablet (81 mg total) by mouth daily. Swallow whole. 30 tablet 11  . atorvastatin (LIPITOR) 80 MG tablet Take 1 tablet (80 mg  total) by mouth daily. 30 tablet 11  . baclofen (LIORESAL) 10 MG tablet Take 10 mg by mouth 3 (three) times daily as needed for muscle spasms.    . buprenorphine (BUTRANS) 20 MCG/HR PTWK Place 1 patch onto the skin every Monday.     . Dulaglutide (TRULICITY) 1.5 JH/4.1DE SOPN Inject 1.5 mg into the skin every Monday.     . ezetimibe (ZETIA) 10 MG tablet Take 10 mg by mouth daily.    . feeding  supplement, ENSURE ENLIVE, (ENSURE ENLIVE) LIQD Take 237 mLs by mouth 2 (two) times daily between meals. 237 mL 12  . furosemide (LASIX) 40 MG tablet Take 1 tablet (40 mg total) by mouth daily as needed. (Patient taking differently: Take 40 mg by mouth daily as needed for fluid. ) 45 tablet 3  . gabapentin (NEURONTIN) 600 MG tablet Take 600 mg by mouth 4 (four) times daily.     . insulin glargine, 1 Unit Dial, (TOUJEO SOLOSTAR) 300 UNIT/ML Solostar Pen Inject 20 Units into the skin at bedtime.     . Ipratropium-Albuterol (COMBIVENT) 20-100 MCG/ACT AERS respimat Inhale 1 puff into the lungs every 6 (six) hours as needed for wheezing or shortness of breath. 1 Inhaler 0  . levothyroxine (SYNTHROID) 75 MCG tablet Take 75 mcg by mouth daily.    Marland Kitchen lidocaine-prilocaine (EMLA) cream Apply 1 application topically as needed. (Patient taking differently: Apply 1 application topically daily as needed (port access). ) 30 g 1  . losartan (COZAAR) 25 MG tablet Take 1 tablet (25 mg total) by mouth daily. 30 tablet 11  . metoprolol (LOPRESSOR) 50 MG tablet Take 50 mg by mouth 2 (two) times daily.     . Multiple Vitamin (MULTIVITAMIN WITH MINERALS) TABS tablet Take 1 tablet by mouth daily.    Marland Kitchen NARCAN 4 MG/0.1ML LIQD nasal spray kit Place 1 spray into the nose as needed for opioid reversal.    . oxyCODONE-acetaminophen (PERCOCET) 10-325 MG tablet Take 1 tablet by mouth every 6 (six) hours as needed (breakthrough pain).     . pantoprazole (PROTONIX) 40 MG tablet Take 40 mg by mouth daily.    . pregabalin (LYRICA) 25 MG capsule Take 25 mg by mouth 2 (two) times daily.     Marland Kitchen senna (SENOKOT) 8.6 MG TABS tablet Take 8.6 mg by mouth daily.     . Vitamin D, Ergocalciferol, (DRISDOL) 1.25 MG (50000 UNIT) CAPS capsule Take 50,000 Units by mouth every Monday.     Marland Kitchen XTAMPZA ER 27 MG C12A Take 27 mg by mouth 2 (two) times daily.      No current facility-administered medications for this visit.   Facility-Administered  Medications Ordered in Other Visits  Medication Dose Route Frequency Provider Last Rate Last Admin  . 0.9 %  sodium chloride infusion   Intravenous Continuous Truitt Merle, MD 20 mL/hr at 02/07/20 1458 New Bag at 02/07/20 1458  . iron sucrose (VENOFER) 200 mg in sodium chloride 0.9 % 100 mL IVPB  200 mg Intravenous Once Truitt Merle, MD        PHYSICAL EXAMINATION: ECOG PERFORMANCE STATUS: 1 - Symptomatic but completely ambulatory  Vitals:   02/07/20 1423  BP: 115/64  Pulse: (!) 58  Resp: 16  Temp: 97.9 F (36.6 C)  SpO2: 100%   Filed Weights   02/07/20 1423  Weight: 177 lb (80.3 kg)    Due to COVID19 we will limit examination to appearance. Patient had no complaints.  GENERAL:alert, no distress and  comfortable SKIN: skin color normal, no rashes or significant lesions EYES: normal, Conjunctiva are pink and non-injected, sclera clear  NEURO: alert & oriented x 3 with fluent speech   LABORATORY DATA:  I have reviewed the data as listed CBC Latest Ref Rng & Units 02/07/2020 02/06/2020 02/06/2020  WBC 4.0 - 10.5 K/uL 14.6(H) - 11.8(H)  Hemoglobin 12.0 - 15.0 g/dL 10.5(L) 12.2 10.6(L)  Hematocrit 36 - 46 % 34.9(L) 36.0 37.1  Platelets 150 - 400 K/uL 277 - 313     CMP Latest Ref Rng & Units 02/06/2020 02/06/2020 01/24/2020  Glucose 70 - 99 mg/dL 269(H) 410(H) 232(H)  BUN 8 - 23 mg/dL 31(H) 28(H) 26(H)  Creatinine 0.44 - 1.00 mg/dL 1.30(H) 1.56(H) 1.21(H)  Sodium 135 - 145 mmol/L 142 137 139  Potassium 3.5 - 5.1 mmol/L 5.4(H) 6.4(HH) 4.6  Chloride 98 - 111 mmol/L 108 103 107  CO2 22 - 32 mmol/L - 24 23  Calcium 8.9 - 10.3 mg/dL - 10.4(H) 10.8(H)  Total Protein 6.5 - 8.1 g/dL - - 7.2  Total Bilirubin 0.3 - 1.2 mg/dL - - 0.3  Alkaline Phos 38 - 126 U/L - - 237(H)  AST 15 - 41 U/L - - 28  ALT 0 - 44 U/L - - 56(H)      RADIOGRAPHIC STUDIES: I have personally reviewed the radiological images as listed and agreed with the findings in the report. IR US Guide Vasc Access  Right  Result Date: 02/07/2020 CLINICAL DATA:  Left-sided headaches with discovery of left middle cerebral artery intracranial aneurysm on workup on MRA examination of the brain. EXAM: BILATERAL COMMON CAROTID AND INNOMINATE ANGIOGRAPHY COMPARISON:  MRA of the brain of November 20, 2019. MEDICATIONS: Heparin 2000 units IA. No antibiotic was administered within 1 hour of the procedure. ANESTHESIA/SEDATION: Versed 1 mg IV; Fentanyl 25 mcg IV Moderate Sedation Time:  42 minutes The patient was continuously monitored during the procedure by the interventional radiology nurse under my direct supervision. CONTRAST:  Isovue 300 approximately 65 mL FLUOROSCOPY TIME:  Fluoroscopy Time: 9 minutes 0 seconds (976 mGy). COMPLICATIONS: None immediate. TECHNIQUE: Informed written consent was obtained from the patient after a thorough discussion of the procedural risks, benefits and alternatives. All questions were addressed. Maximal Sterile Barrier Technique was utilized including caps, mask, sterile gowns, sterile gloves, sterile drape, hand hygiene and skin antiseptic. A timeout was performed prior to the initiation of the procedure. The right forearm to the wrist was prepped and draped in the usual sterile manner. The right radial artery was then identified with ultrasound, and its morphology documented. A dorsal palmar anastomosis was verified to be present. Using ultrasound guidance and a micropuncture set access into the right radial artery was obtained over a 0.018 inch micro guidewire. A 4/5 French radial sheath was then inserted. The obturator, and the wire were removed. Good aspiration was obtained from the side port of the radial sheath. A cocktail of 1000 units of heparin, 2.5 mg of verapamil, and 200 mcg of nitroglycerin was then infused in diluted form through the sheath without event. A right radial arteriogram was performed. Over a 0.035 inch Roadrunner guidewire, a 5 [place] Simmons 2 diagnostic catheter was advanced  to the aortic arch region, and selectively positioned in the right vertebral artery, the right common carotid artery, the left common carotid artery, and the left vertebral artery. Following the procedure, a right radial wrist band was applied at the radial puncture site for hemostasis. Distal right radial pulse was  verified to be present. FINDINGS: The right vertebral artery origin is widely patent. The vessel is seen to opacify to the cranial skull base. Wide patency is seen of the right vertebrobasilar junction and the right posterior-inferior cerebellar artery. The basilar artery, the posterior cerebral arteries, the superior cerebellar arteries, and the anterior-inferior cerebellar arteries opacify into the capillary and venous phases. Unopacified blood is seen in the basilar artery from the contralateral vertebral artery. The origin of the left vertebral artery is widely patent. The vessel is seen to opacify to the cranial skull base. Wide patency is seen of the left vertebrobasilar junction and the left posterior-inferior cerebellar artery. The opacified portion of the basilar artery, the posterior cerebral arteries, the superior cerebellar arteries and the anterior-inferior cerebellar arteries demonstrate opacification into the capillary and venous phases. Unopacified blood is seen in the basilar artery from the contralateral vertebral artery. The left common carotid arteriogram demonstrates the left external carotid artery and its major branches to be widely patent. The left internal carotid artery at the bulb to the cranial skull base is also widely patent. The petrous, the cavernous and the supraclinoid segments are widely patent. Arising in the left MCA bifurcation is a mildly lobulated saccular aneurysm measuring approximately 4.6 mm x 3.8 mm with a neck of approximately 2.3 mm. The origins of the superior and inferior division of the left middle cerebral artery appears to be in the region of the neck  of the aneurysm. The left anterior cerebral artery opacifies into the capillary and venous phases. Prompt cross filling via the anterior communicating artery of the right anterior cerebral A2 segment is seen with washout from the contralateral unopacified blood. Also seen arising in the right posterior communicating artery region is a 2 mm x 2 mm saccular outpouching probably representing an aneurysm. The right common carotid arteriogram demonstrates the right external carotid artery and its major branches to be widely patent. The right internal carotid artery at the bulb has a smooth shallow plaque along the posterior wall associated with a small ulceration. No evidence of intraluminal filling defects or stenosis is noted. More distally the vessel is seen to opacify to the cranial skull base. The petrous, the cavernous and the supraclinoid segments are widely patent. The right middle cerebral artery and the right anterior cerebral artery opacify into the capillary and venous phases. 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 Mild atherosclerotic disease involving the right internal carotid artery at the bulb with a small ulceration. PLAN: Findings were reviewed with the patient. Conservative managements were again discussed. The option of endovascular treatment of the left middle cerebral artery aneurysm was reviewed in detail. She was informed this may entail use of an intra saccular disruption device also noted as the web, coiling with or without stent assistance. Risks of procedure were reviewed briefly. Patient expressed her desire to proceed with endovascular treatment of the left middle cerebral artery aneurysm as soon as the COVID-19 overnight stay restrictions are removed. Patient will be started on aspirin 81 mg a day and Plavix 75  mg a day for 7 days prior to the procedure. Patient was advised to call should she have any further questions. Patient expressed good understanding and agreement with the above management plan. Electronically Signed   By: Luanne Bras M.D.   On: 02/06/2020 14:38   IR ANGIO INTRA  EXTRACRAN SEL COM CAROTID INNOMINATE BILAT MOD SED  Result Date: 02/07/2020 CLINICAL DATA:  Left-sided headaches with discovery of left middle cerebral artery intracranial aneurysm on workup on MRA examination of the brain. EXAM: BILATERAL COMMON CAROTID AND INNOMINATE ANGIOGRAPHY COMPARISON:  MRA of the brain of November 20, 2019. MEDICATIONS: Heparin 2000 units IA. No antibiotic was administered within 1 hour of the procedure. ANESTHESIA/SEDATION: Versed 1 mg IV; Fentanyl 25 mcg IV Moderate Sedation Time:  42 minutes The patient was continuously monitored during the procedure by the interventional radiology nurse under my direct supervision. CONTRAST:  Isovue 300 approximately 65 mL FLUOROSCOPY TIME:  Fluoroscopy Time: 9 minutes 0 seconds (976 mGy). COMPLICATIONS: None immediate. TECHNIQUE: Informed written consent was obtained from the patient after a thorough discussion of the procedural risks, benefits and alternatives. All questions were addressed. Maximal Sterile Barrier Technique was utilized including caps, mask, sterile gowns, sterile gloves, sterile drape, hand hygiene and skin antiseptic. A timeout was performed prior to the initiation of the procedure. The right forearm to the wrist was prepped and draped in the usual sterile manner. The right radial artery was then identified with ultrasound, and its morphology documented. A dorsal palmar anastomosis was verified to be present. Using ultrasound guidance and a micropuncture set access into the right radial artery was obtained over a 0.018 inch micro guidewire. A 4/5 French radial sheath was then inserted. The obturator, and the wire were removed. Good aspiration was  obtained from the side port of the radial sheath. A cocktail of 1000 units of heparin, 2.5 mg of verapamil, and 200 mcg of nitroglycerin was then infused in diluted form through the sheath without event. A right radial arteriogram was performed. Over a 0.035 inch Roadrunner guidewire, a 5 [place] Simmons 2 diagnostic catheter was advanced to the aortic arch region, and selectively positioned in the right vertebral artery, the right common carotid artery, the left common carotid artery, and the left vertebral artery. Following the procedure, a right radial wrist band was applied at the radial puncture site for hemostasis. Distal right radial pulse was verified to be present. FINDINGS: The right vertebral artery origin is widely patent. The vessel is seen to opacify to the cranial skull base. Wide patency is seen of the right vertebrobasilar junction and the right posterior-inferior cerebellar artery. The basilar artery, the posterior cerebral arteries, the superior cerebellar arteries, and the anterior-inferior cerebellar arteries opacify into the capillary and venous phases. Unopacified blood is seen in the basilar artery from the contralateral vertebral artery. The origin of the left vertebral artery is widely patent. The vessel is seen to opacify to the cranial skull base. Wide patency is seen of the left vertebrobasilar junction and the left posterior-inferior cerebellar artery. The opacified portion of the basilar artery, the posterior cerebral arteries, the superior cerebellar arteries and the anterior-inferior cerebellar arteries demonstrate opacification into the capillary and venous phases. Unopacified blood is seen in the basilar artery from the contralateral vertebral artery. The left common carotid arteriogram demonstrates the left external carotid artery and its major branches to be widely patent. The left internal carotid artery at the bulb to the cranial skull base is also widely patent. The petrous,  the cavernous and the supraclinoid segments are widely patent. Arising in the left MCA bifurcation is a mildly lobulated saccular aneurysm measuring approximately 4.6 mm x 3.8 mm with a neck of approximately 2.3 mm. The origins of the superior and inferior division of the left middle cerebral artery appears to  be in the region of the neck of the aneurysm. The left anterior cerebral artery opacifies into the capillary and venous phases. Prompt cross filling via the anterior communicating artery of the right anterior cerebral A2 segment is seen with washout from the contralateral unopacified blood. Also seen arising in the right posterior communicating artery region is a 2 mm x 2 mm saccular outpouching probably representing an aneurysm. The right common carotid arteriogram demonstrates the right external carotid artery and its major branches to be widely patent. The right internal carotid artery at the bulb has a smooth shallow plaque along the posterior wall associated with a small ulceration. No evidence of intraluminal filling defects or stenosis is noted. More distally the vessel is seen to opacify to the cranial skull base. The petrous, the cavernous and the supraclinoid segments are widely patent. The right middle cerebral artery and the right anterior cerebral artery opacify into the capillary and venous phases. 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 Mild atherosclerotic disease involving the right internal carotid artery at the bulb with a small ulceration. PLAN: Findings were reviewed with the patient. Conservative managements were again discussed. The option of endovascular treatment of the left middle cerebral artery aneurysm was reviewed in detail. She was informed this may entail use of an intra saccular disruption device  also noted as the web, coiling with or without stent assistance. Risks of procedure were reviewed briefly. Patient expressed her desire to proceed with endovascular treatment of the left middle cerebral artery aneurysm as soon as the COVID-19 overnight stay restrictions are removed. Patient will be started on aspirin 81 mg a day and Plavix 75 mg a day for 7 days prior to the procedure. Patient was advised to call should she have any further questions. Patient expressed good understanding and agreement with the above management plan. Electronically Signed   By: Luanne Bras M.D.   On: 02/06/2020 14:38   IR ANGIO VERTEBRAL SEL VERTEBRAL BILAT MOD SED  Result Date: 02/07/2020 CLINICAL DATA:  Left-sided headaches with discovery of left middle cerebral artery intracranial aneurysm on workup on MRA examination of the brain. EXAM: BILATERAL COMMON CAROTID AND INNOMINATE ANGIOGRAPHY COMPARISON:  MRA of the brain of November 20, 2019. MEDICATIONS: Heparin 2000 units IA. No antibiotic was administered within 1 hour of the procedure. ANESTHESIA/SEDATION: Versed 1 mg IV; Fentanyl 25 mcg IV Moderate Sedation Time:  42 minutes The patient was continuously monitored during the procedure by the interventional radiology nurse under my direct supervision. CONTRAST:  Isovue 300 approximately 65 mL FLUOROSCOPY TIME:  Fluoroscopy Time: 9 minutes 0 seconds (976 mGy). COMPLICATIONS: None immediate. TECHNIQUE: Informed written consent was obtained from the patient after a thorough discussion of the procedural risks, benefits and alternatives. All questions were addressed. Maximal Sterile Barrier Technique was utilized including caps, mask, sterile gowns, sterile gloves, sterile drape, hand hygiene and skin antiseptic. A timeout was performed prior to the initiation of the procedure. The right forearm to the wrist was prepped and draped in the usual sterile manner. The right radial artery was then identified with ultrasound, and its  morphology documented. A dorsal palmar anastomosis was verified to be present. Using ultrasound guidance and a micropuncture set access into the right radial artery was obtained over a 0.018 inch micro guidewire. A 4/5 French radial sheath was then inserted. The obturator, and the  wire were removed. Good aspiration was obtained from the side port of the radial sheath. A cocktail of 1000 units of heparin, 2.5 mg of verapamil, and 200 mcg of nitroglycerin was then infused in diluted form through the sheath without event. A right radial arteriogram was performed. Over a 0.035 inch Roadrunner guidewire, a 5 [place] Simmons 2 diagnostic catheter was advanced to the aortic arch region, and selectively positioned in the right vertebral artery, the right common carotid artery, the left common carotid artery, and the left vertebral artery. Following the procedure, a right radial wrist band was applied at the radial puncture site for hemostasis. Distal right radial pulse was verified to be present. FINDINGS: The right vertebral artery origin is widely patent. The vessel is seen to opacify to the cranial skull base. Wide patency is seen of the right vertebrobasilar junction and the right posterior-inferior cerebellar artery. The basilar artery, the posterior cerebral arteries, the superior cerebellar arteries, and the anterior-inferior cerebellar arteries opacify into the capillary and venous phases. Unopacified blood is seen in the basilar artery from the contralateral vertebral artery. The origin of the left vertebral artery is widely patent. The vessel is seen to opacify to the cranial skull base. Wide patency is seen of the left vertebrobasilar junction and the left posterior-inferior cerebellar artery. The opacified portion of the basilar artery, the posterior cerebral arteries, the superior cerebellar arteries and the anterior-inferior cerebellar arteries demonstrate opacification into the capillary and venous phases.  Unopacified blood is seen in the basilar artery from the contralateral vertebral artery. The left common carotid arteriogram demonstrates the left external carotid artery and its major branches to be widely patent. The left internal carotid artery at the bulb to the cranial skull base is also widely patent. The petrous, the cavernous and the supraclinoid segments are widely patent. Arising in the left MCA bifurcation is a mildly lobulated saccular aneurysm measuring approximately 4.6 mm x 3.8 mm with a neck of approximately 2.3 mm. The origins of the superior and inferior division of the left middle cerebral artery appears to be in the region of the neck of the aneurysm. The left anterior cerebral artery opacifies into the capillary and venous phases. Prompt cross filling via the anterior communicating artery of the right anterior cerebral A2 segment is seen with washout from the contralateral unopacified blood. Also seen arising in the right posterior communicating artery region is a 2 mm x 2 mm saccular outpouching probably representing an aneurysm. The right common carotid arteriogram demonstrates the right external carotid artery and its major branches to be widely patent. The right internal carotid artery at the bulb has a smooth shallow plaque along the posterior wall associated with a small ulceration. No evidence of intraluminal filling defects or stenosis is noted. More distally the vessel is seen to opacify to the cranial skull base. The petrous, the cavernous and the supraclinoid segments are widely patent. The right middle cerebral artery and the right anterior cerebral artery opacify into the capillary and venous phases. 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 Mild atherosclerotic disease involving the right internal carotid  artery at the bulb with a small ulceration. PLAN: Findings were reviewed with the patient. Conservative managements were again discussed. The option of endovascular treatment of the left middle cerebral artery aneurysm was reviewed in detail. She was informed this may entail use  of an intra saccular disruption device also noted as the web, coiling with or without stent assistance. Risks of procedure were reviewed briefly. Patient expressed her desire to proceed with endovascular treatment of the left middle cerebral artery aneurysm as soon as the COVID-19 overnight stay restrictions are removed. Patient will be started on aspirin 81 mg a day and Plavix 75 mg a day for 7 days prior to the procedure. Patient was advised to call should she have any further questions. Patient expressed good understanding and agreement with the above management plan. Electronically Signed   By: Luanne Bras M.D.   On: 02/06/2020 14:38     ASSESSMENT & PLAN:  SHIZA THELEN is a 69 y.o. female with    1. 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 She has required blood transfusions in the past in 2013 and in 2018 and frequent hospital admission for anemia. -She has intermittent epistaxis, but no skin or mucosa telangiectasia, no significant family history of bleeding disorder, she does not meet the diagnosis criteria for hereditary hemorrhagic telangiectasia. -Her lastcolonoscopy/endoscopy was 04/2017. -She is not on oral iron, due to the lack of benefit. -Currently being treated withIV Iron to prevent significant anemiawith goal of Ferritin 100-200 range. She was switched to IV Venofer on 8/19/20due to her insurance coverage.  -Labs today show WBC 14.6, Hg 10.5, ANC 12.2. Iron panel still pending. Proceed with 249m Venofer today.  -Plan to increase to 4049mevery 2  weeks starting 02/14/20. She is agreeable.  -F/u in 7 weeks.    2. Chronic GI Bleeding, intermittent epistaxis -Has intestinal AVM history  -Lastcolonoscopy/EGD by Dr. ScMichail Sermonn 04/17/17 showed gastritis, diverticulosis and internal hemorrhoids, all benign with no obvious signs of bleeding at that time. -She does not meet the diagnosis criteria for hereditary hemorrhagic telangiectasia (HHT) -Due to her recent GI bleeding, she plans [facility]   3. H/o of arterial thrombosis, Ischemic stroke 11/2019, Left Brain Aneurysm (40m8mnd 2mm69mS/p unilateral nephrectomy due to damage from arterial thrombosis -Managed by vascular physician, Dr. DixoDoren Custard neurologist Dr SethLeonie Manrevious work-up for lupus anticoagulantand antiphospholipid syndromewerenegative, normal protein S and C level. -Continue aspirin 81mg7mly. Due to her recent GI bleeding, will hold on Plavix. -She notes she still has mild speech deficit (difficulty finding her words).  -On 02/06/20 Dr DevesAllegra Granad a second brain aneurysm. She may have procedure later this year.   4. Smoking Cessation, Lung Nodules -Currently smokes 1.5packs over 2 days currently. She has been smoking for 40 years. -IhavJaneal Holmesatedly encouraged her to continue to try to quit completely, she is willing to try.  -Her CT Chest from 07/12/19 shows Stable 7 mm nodule in left lower lobe. Also shows New 4 mm nodule is noted in left upper lobe and 12 mm sub solid density is noted in left lung apex.    5. COPD, HTN, CKD stage III, DM with Peripheral Neuropathy, Chronic Back Pain  -She is currently on 600mg 640mpentin 4 times a day. -Continue to f[facility]. Her balance is still decreased especially after 11/2019 stroke. She ambulates with cane and walker as needed.   6. Cancer Screening  -Her 07/12/19 CT chest also showed 23mm17m  mass right breast mass which Pt notes she has had for years. Previous diagnostic mammogram showed likely benign, and she previously had biopsy in 2014 which showed fibroadenoma. I highly recommend she have annual mammogram for better screening.    PLAN: -Proceed with Venofer 235m today  -Lab, flush, and IV Venofer 4029mon 02/14/20 -Lab, flush, and IV Venofer 40011mn 3, 5, 7 weeks, will hold Venofer if previous ferritin>300 -f/u in 7 weeks     No problem-specific Assessment & Plan notes found for this encounter.   No orders of the defined types were placed in this encounter.  All questions were answered. The patient knows to call the clinic with any problems, questions or concerns. No barriers to learning was detected. The total time spent in the appointment was 25 minutes.     YanTruitt MerleD 02/07/2020   I, AmoJoslyn Devonm acting as scribe for YanTruitt MerleD.   I have reviewed the above documentation for accuracy and completeness, and I agree with the above.

## 2020-02-10 LAB — IRON AND TIBC
Iron: 28 ug/dL — ABNORMAL LOW (ref 41–142)
Saturation Ratios: 8 % — ABNORMAL LOW (ref 21–57)
TIBC: 363 ug/dL (ref 236–444)
UIBC: 334 ug/dL (ref 120–384)

## 2020-02-10 LAB — FERRITIN: Ferritin: 157 ng/mL (ref 11–307)

## 2020-02-14 ENCOUNTER — Other Ambulatory Visit: Payer: Medicare Other

## 2020-02-14 ENCOUNTER — Ambulatory Visit: Payer: Medicare Other | Admitting: Hematology

## 2020-02-14 ENCOUNTER — Telehealth: Payer: Self-pay | Admitting: Hematology

## 2020-02-14 ENCOUNTER — Inpatient Hospital Stay: Payer: Medicare Other | Attending: Hematology

## 2020-02-14 ENCOUNTER — Inpatient Hospital Stay: Payer: Medicare Other

## 2020-02-14 ENCOUNTER — Other Ambulatory Visit: Payer: Self-pay

## 2020-02-14 VITALS — BP 142/49 | HR 69 | Temp 98.4°F | Resp 18

## 2020-02-14 DIAGNOSIS — Z23 Encounter for immunization: Secondary | ICD-10-CM | POA: Diagnosis not present

## 2020-02-14 DIAGNOSIS — D500 Iron deficiency anemia secondary to blood loss (chronic): Secondary | ICD-10-CM

## 2020-02-14 DIAGNOSIS — Z95828 Presence of other vascular implants and grafts: Secondary | ICD-10-CM

## 2020-02-14 DIAGNOSIS — K5520 Angiodysplasia of colon without hemorrhage: Secondary | ICD-10-CM

## 2020-02-14 DIAGNOSIS — Q2733 Arteriovenous malformation of digestive system vessel: Secondary | ICD-10-CM | POA: Insufficient documentation

## 2020-02-14 LAB — CBC WITH DIFFERENTIAL (CANCER CENTER ONLY)
Abs Immature Granulocytes: 0.05 10*3/uL (ref 0.00–0.07)
Basophils Absolute: 0 10*3/uL (ref 0.0–0.1)
Basophils Relative: 0 %
Eosinophils Absolute: 0.1 10*3/uL (ref 0.0–0.5)
Eosinophils Relative: 1 %
HCT: 33.8 % — ABNORMAL LOW (ref 36.0–46.0)
Hemoglobin: 9.9 g/dL — ABNORMAL LOW (ref 12.0–15.0)
Immature Granulocytes: 1 %
Lymphocytes Relative: 14 %
Lymphs Abs: 1.5 10*3/uL (ref 0.7–4.0)
MCH: 24.3 pg — ABNORMAL LOW (ref 26.0–34.0)
MCHC: 29.3 g/dL — ABNORMAL LOW (ref 30.0–36.0)
MCV: 82.8 fL (ref 80.0–100.0)
Monocytes Absolute: 0.8 10*3/uL (ref 0.1–1.0)
Monocytes Relative: 7 %
Neutro Abs: 8.1 10*3/uL — ABNORMAL HIGH (ref 1.7–7.7)
Neutrophils Relative %: 77 %
Platelet Count: 277 10*3/uL (ref 150–400)
RBC: 4.08 MIL/uL (ref 3.87–5.11)
RDW: 15.7 % — ABNORMAL HIGH (ref 11.5–15.5)
WBC Count: 10.6 10*3/uL — ABNORMAL HIGH (ref 4.0–10.5)
nRBC: 0 % (ref 0.0–0.2)

## 2020-02-14 LAB — IRON AND TIBC
Iron: 41 ug/dL (ref 41–142)
Saturation Ratios: 12 % — ABNORMAL LOW (ref 21–57)
TIBC: 338 ug/dL (ref 236–444)
UIBC: 297 ug/dL (ref 120–384)

## 2020-02-14 LAB — FERRITIN: Ferritin: 243 ng/mL (ref 11–307)

## 2020-02-14 MED ORDER — SODIUM CHLORIDE 0.9% FLUSH
10.0000 mL | Freq: Once | INTRAVENOUS | Status: AC
  Administered 2020-02-14: 10 mL via INTRAVENOUS
  Filled 2020-02-14: qty 10

## 2020-02-14 MED ORDER — SODIUM CHLORIDE 0.9 % IV SOLN
INTRAVENOUS | Status: DC
  Filled 2020-02-14 (×2): qty 250

## 2020-02-14 MED ORDER — SODIUM CHLORIDE 0.9 % IV SOLN
400.0000 mg | Freq: Once | INTRAVENOUS | Status: AC
  Administered 2020-02-14: 400 mg via INTRAVENOUS
  Filled 2020-02-14: qty 20

## 2020-02-14 MED ORDER — INFLUENZA VAC A&B SA ADJ QUAD 0.5 ML IM PRSY
PREFILLED_SYRINGE | INTRAMUSCULAR | Status: AC
  Filled 2020-02-14: qty 0.5

## 2020-02-14 MED ORDER — HEPARIN SOD (PORK) LOCK FLUSH 100 UNIT/ML IV SOLN
500.0000 [IU] | Freq: Once | INTRAVENOUS | Status: AC
  Administered 2020-02-14: 500 [IU] via INTRAVENOUS
  Filled 2020-02-14: qty 5

## 2020-02-14 MED ORDER — SODIUM CHLORIDE 0.9 % IV SOLN: 200.0000 mg | Freq: Once | INTRAVENOUS | Status: DC

## 2020-02-14 MED ORDER — INFLUENZA VAC A&B SA ADJ QUAD 0.5 ML IM PRSY
0.5000 mL | PREFILLED_SYRINGE | Freq: Once | INTRAMUSCULAR | Status: AC
  Administered 2020-02-14: 0.5 mL via INTRAMUSCULAR

## 2020-02-14 NOTE — Telephone Encounter (Signed)
Scheduled per infusion request a covid booster.

## 2020-02-14 NOTE — Patient Instructions (Signed)
Iron Sucrose injection What is this medicine? IRON SUCROSE (AHY ern SOO krohs) is an iron complex. Iron is used to make healthy red blood cells, which carry oxygen and nutrients throughout the body. This medicine is used to treat iron deficiency anemia in people with chronic kidney disease. This medicine may be used for other purposes; ask your health care provider or pharmacist if you have questions. COMMON BRAND NAME(S): Venofer What should I tell my health care provider before I take this medicine? They need to know if you have any of these conditions:  anemia not caused by low iron levels  heart disease  high levels of iron in the blood  kidney disease  liver disease  an unusual or allergic reaction to iron, other medicines, foods, dyes, or preservatives  pregnant or trying to get pregnant  breast-feeding How should I use this medicine? This medicine is for infusion into a vein. It is given by a health care professional in a hospital or clinic setting. Talk to your pediatrician regarding the use of this medicine in children. While this drug may be prescribed for children as young as 2 years for selected conditions, precautions do apply. Overdosage: If you think you have taken too much of this medicine contact a poison control center or emergency room at once. NOTE: This medicine is only for you. Do not share this medicine with others. What if I miss a dose? It is important not to miss your dose. Call your doctor or health care professional if you are unable to keep an appointment. What may interact with this medicine? Do not take this medicine with any of the following medications:  deferoxamine  dimercaprol  other iron products This medicine may also interact with the following medications:  chloramphenicol  deferasirox This list may not describe all possible interactions. Give your health care provider a list of all the medicines, herbs, non-prescription drugs, or  dietary supplements you use. Also tell them if you smoke, drink alcohol, or use illegal drugs. Some items may interact with your medicine. What should I watch for while using this medicine? Visit your doctor or healthcare professional regularly. Tell your doctor or healthcare professional if your symptoms do not start to get better or if they get worse. You may need blood work done while you are taking this medicine. You may need to follow a special diet. Talk to your doctor. Foods that contain iron include: whole grains/cereals, dried fruits, beans, or peas, leafy green vegetables, and organ meats (liver, kidney). What side effects may I notice from receiving this medicine? Side effects that you should report to your doctor or health care professional as soon as possible:  allergic reactions like skin rash, itching or hives, swelling of the face, lips, or tongue  breathing problems  changes in blood pressure  cough  fast, irregular heartbeat  feeling faint or lightheaded, falls  fever or chills  flushing, sweating, or hot feelings  joint or muscle aches/pains  seizures  swelling of the ankles or feet  unusually weak or tired Side effects that usually do not require medical attention (report to your doctor or health care professional if they continue or are bothersome):  diarrhea  feeling achy  headache  irritation at site where injected  nausea, vomiting  stomach upset  tiredness This list may not describe all possible side effects. Call your doctor for medical advice about side effects. You may report side effects to FDA at 1-800-FDA-1088. Where should I keep   my medicine? This drug is given in a hospital or clinic and will not be stored at home. NOTE: This sheet is a summary. It may not cover all possible information. If you have questions about this medicine, talk to your doctor, pharmacist, or health care provider.  2020 Elsevier/Gold Standard (2011-02-10  17:14:35)  Influenza Virus Vaccine injection What is this medicine? INFLUENZA VIRUS VACCINE (in floo EN zuh VAHY ruhs vak SEEN) helps to reduce the risk of getting influenza also known as the flu. The vaccine only helps protect you against some strains of the flu. This medicine may be used for other purposes; ask your health care provider or pharmacist if you have questions. COMMON BRAND NAME(S): Afluria, Afluria Quadrivalent, Agriflu, Alfuria, FLUAD, Fluarix, Fluarix Quadrivalent, Flublok, Flublok Quadrivalent, FLUCELVAX, FLUCELVAX Quadrivalent, Flulaval, Flulaval Quadrivalent, Fluvirin, Fluzone, Fluzone High-Dose, Fluzone Intradermal, Fluzone Quadrivalent What should I tell my health care provider before I take this medicine? They need to know if you have any of these conditions:  bleeding disorder like hemophilia  fever or infection  Guillain-Barre syndrome or other neurological problems  immune system problems  infection with the human immunodeficiency virus (HIV) or AIDS  low blood platelet counts  multiple sclerosis  an unusual or allergic reaction to influenza virus vaccine, latex, other medicines, foods, dyes, or preservatives. Different brands of vaccines contain different allergens. Some may contain latex or eggs. Talk to your doctor about your allergies to make sure that you get the right vaccine.  pregnant or trying to get pregnant  breast-feeding How should I use this medicine? This vaccine is for injection into a muscle or under the skin. It is given by a health care professional. A copy of Vaccine Information Statements will be given before each vaccination. Read this sheet carefully each time. The sheet may change frequently. Talk to your healthcare provider to see which vaccines are right for you. Some vaccines should not be used in all age groups. Overdosage: If you think you have taken too much of this medicine contact a poison control center or emergency room at  once. NOTE: This medicine is only for you. Do not share this medicine with others. What if I miss a dose? This does not apply. What may interact with this medicine?  chemotherapy or radiation therapy  medicines that lower your immune system like etanercept, anakinra, infliximab, and adalimumab  medicines that treat or prevent blood clots like warfarin  phenytoin  steroid medicines like prednisone or cortisone  theophylline  vaccines This list may not describe all possible interactions. Give your health care provider a list of all the medicines, herbs, non-prescription drugs, or dietary supplements you use. Also tell them if you smoke, drink alcohol, or use illegal drugs. Some items may interact with your medicine. What should I watch for while using this medicine? Report any side effects that do not go away within 3 days to your doctor or health care professional. Call your health care provider if any unusual symptoms occur within 6 weeks of receiving this vaccine. You may still catch the flu, but the illness is not usually as bad. You cannot get the flu from the vaccine. The vaccine will not protect against colds or other illnesses that may cause fever. The vaccine is needed every year. What side effects may I notice from receiving this medicine? Side effects that you should report to your doctor or health care professional as soon as possible:  allergic reactions like skin rash, itching or hives, swelling   of the face, lips, or tongue Side effects that usually do not require medical attention (report to your doctor or health care professional if they continue or are bothersome):  fever  headache  muscle aches and pains  pain, tenderness, redness, or swelling at the injection site  tiredness This list may not describe all possible side effects. Call your doctor for medical advice about side effects. You may report side effects to FDA at 1-800-FDA-1088. Where should I keep my  medicine? The vaccine will be given by a health care professional in a clinic, pharmacy, doctor's office, or other health care setting. You will not be given vaccine doses to store at home. NOTE: This sheet is a summary. It may not cover all possible information. If you have questions about this medicine, talk to your doctor, pharmacist, or health care provider.  2020 Elsevier/Gold Standard (2018-03-27 08:45:43)  

## 2020-02-14 NOTE — Progress Notes (Signed)
Patient declined 30 minute post-observation. VSS upon discharge. 

## 2020-02-27 ENCOUNTER — Other Ambulatory Visit (HOSPITAL_COMMUNITY): Payer: Self-pay | Admitting: Interventional Radiology

## 2020-02-27 DIAGNOSIS — I671 Cerebral aneurysm, nonruptured: Secondary | ICD-10-CM

## 2020-02-28 ENCOUNTER — Other Ambulatory Visit: Payer: Self-pay

## 2020-02-28 ENCOUNTER — Inpatient Hospital Stay: Payer: Medicare Other

## 2020-02-28 VITALS — BP 141/49 | HR 62 | Temp 98.2°F | Resp 18

## 2020-02-28 DIAGNOSIS — D500 Iron deficiency anemia secondary to blood loss (chronic): Secondary | ICD-10-CM

## 2020-02-28 DIAGNOSIS — K5520 Angiodysplasia of colon without hemorrhage: Secondary | ICD-10-CM

## 2020-02-28 DIAGNOSIS — Q2733 Arteriovenous malformation of digestive system vessel: Secondary | ICD-10-CM | POA: Diagnosis not present

## 2020-02-28 DIAGNOSIS — Z23 Encounter for immunization: Secondary | ICD-10-CM

## 2020-02-28 LAB — FERRITIN: Ferritin: 261 ng/mL (ref 11–307)

## 2020-02-28 LAB — CBC WITH DIFFERENTIAL (CANCER CENTER ONLY)
Abs Immature Granulocytes: 0.03 10*3/uL (ref 0.00–0.07)
Basophils Absolute: 0 10*3/uL (ref 0.0–0.1)
Basophils Relative: 1 %
Eosinophils Absolute: 0.2 10*3/uL (ref 0.0–0.5)
Eosinophils Relative: 2 %
HCT: 34.5 % — ABNORMAL LOW (ref 36.0–46.0)
Hemoglobin: 10.2 g/dL — ABNORMAL LOW (ref 12.0–15.0)
Immature Granulocytes: 0 %
Lymphocytes Relative: 20 %
Lymphs Abs: 1.5 10*3/uL (ref 0.7–4.0)
MCH: 24.6 pg — ABNORMAL LOW (ref 26.0–34.0)
MCHC: 29.6 g/dL — ABNORMAL LOW (ref 30.0–36.0)
MCV: 83.3 fL (ref 80.0–100.0)
Monocytes Absolute: 0.5 10*3/uL (ref 0.1–1.0)
Monocytes Relative: 7 %
Neutro Abs: 5.2 10*3/uL (ref 1.7–7.7)
Neutrophils Relative %: 70 %
Platelet Count: 211 10*3/uL (ref 150–400)
RBC: 4.14 MIL/uL (ref 3.87–5.11)
RDW: 16.7 % — ABNORMAL HIGH (ref 11.5–15.5)
WBC Count: 7.5 10*3/uL (ref 4.0–10.5)
nRBC: 0 % (ref 0.0–0.2)

## 2020-02-28 LAB — IRON AND TIBC
Iron: 56 ug/dL (ref 41–142)
Saturation Ratios: 17 % — ABNORMAL LOW (ref 21–57)
TIBC: 333 ug/dL (ref 236–444)
UIBC: 276 ug/dL (ref 120–384)

## 2020-02-28 MED ORDER — SODIUM CHLORIDE 0.9% FLUSH
10.0000 mL | INTRAVENOUS | Status: AC | PRN
  Administered 2020-02-28: 10 mL
  Filled 2020-02-28: qty 10

## 2020-02-28 MED ORDER — HEPARIN SOD (PORK) LOCK FLUSH 100 UNIT/ML IV SOLN
500.0000 [IU] | INTRAVENOUS | Status: AC | PRN
  Administered 2020-02-28: 500 [IU]
  Filled 2020-02-28: qty 5

## 2020-02-28 MED ORDER — SODIUM CHLORIDE 0.9% FLUSH
10.0000 mL | Freq: Once | INTRAVENOUS | Status: DC
  Filled 2020-02-28: qty 10

## 2020-02-28 MED ORDER — SODIUM CHLORIDE 0.9 % IV SOLN
400.0000 mg | Freq: Once | INTRAVENOUS | Status: AC
  Administered 2020-02-28: 400 mg via INTRAVENOUS
  Filled 2020-02-28: qty 20

## 2020-02-28 MED ORDER — SODIUM CHLORIDE 0.9 % IV SOLN
INTRAVENOUS | Status: DC
  Filled 2020-02-28: qty 250

## 2020-02-28 NOTE — Patient Instructions (Signed)

## 2020-02-28 NOTE — Progress Notes (Signed)
Patient refused 30 min post observation iron infusion.  Patient ambulated from facility in stable condition and no complaints.

## 2020-02-28 NOTE — Patient Instructions (Signed)
Iron Sucrose injection What is this medicine? IRON SUCROSE (AHY ern SOO krohs) is an iron complex. Iron is used to make healthy red blood cells, which carry oxygen and nutrients throughout the body. This medicine is used to treat iron deficiency anemia in people with chronic kidney disease. This medicine may be used for other purposes; ask your health care provider or pharmacist if you have questions. COMMON BRAND NAME(S): Venofer What should I tell my health care provider before I take this medicine? They need to know if you have any of these conditions:  anemia not caused by low iron levels  heart disease  high levels of iron in the blood  kidney disease  liver disease  an unusual or allergic reaction to iron, other medicines, foods, dyes, or preservatives  pregnant or trying to get pregnant  breast-feeding How should I use this medicine? This medicine is for infusion into a vein. It is given by a health care professional in a hospital or clinic setting. Talk to your pediatrician regarding the use of this medicine in children. While this drug may be prescribed for children as young as 2 years for selected conditions, precautions do apply. Overdosage: If you think you have taken too much of this medicine contact a poison control center or emergency room at once. NOTE: This medicine is only for you. Do not share this medicine with others. What if I miss a dose? It is important not to miss your dose. Call your doctor or health care professional if you are unable to keep an appointment. What may interact with this medicine? Do not take this medicine with any of the following medications:  deferoxamine  dimercaprol  other iron products This medicine may also interact with the following medications:  chloramphenicol  deferasirox This list may not describe all possible interactions. Give your health care provider a list of all the medicines, herbs, non-prescription drugs, or  dietary supplements you use. Also tell them if you smoke, drink alcohol, or use illegal drugs. Some items may interact with your medicine. What should I watch for while using this medicine? Visit your doctor or healthcare professional regularly. Tell your doctor or healthcare professional if your symptoms do not start to get better or if they get worse. You may need blood work done while you are taking this medicine. You may need to follow a special diet. Talk to your doctor. Foods that contain iron include: whole grains/cereals, dried fruits, beans, or peas, leafy green vegetables, and organ meats (liver, kidney). What side effects may I notice from receiving this medicine? Side effects that you should report to your doctor or health care professional as soon as possible:  allergic reactions like skin rash, itching or hives, swelling of the face, lips, or tongue  breathing problems  changes in blood pressure  cough  fast, irregular heartbeat  feeling faint or lightheaded, falls  fever or chills  flushing, sweating, or hot feelings  joint or muscle aches/pains  seizures  swelling of the ankles or feet  unusually weak or tired Side effects that usually do not require medical attention (report to your doctor or health care professional if they continue or are bothersome):  diarrhea  feeling achy  headache  irritation at site where injected  nausea, vomiting  stomach upset  tiredness This list may not describe all possible side effects. Call your doctor for medical advice about side effects. You may report side effects to FDA at 1-800-FDA-1088. Where should I keep   my medicine? This drug is given in a hospital or clinic and will not be stored at home. NOTE: This sheet is a summary. It may not cover all possible information. If you have questions about this medicine, talk to your doctor, pharmacist, or health care provider.  2020 Elsevier/Gold Standard (2011-02-10  17:14:35)  Influenza Virus Vaccine injection What is this medicine? INFLUENZA VIRUS VACCINE (in floo EN zuh VAHY ruhs vak SEEN) helps to reduce the risk of getting influenza also known as the flu. The vaccine only helps protect you against some strains of the flu. This medicine may be used for other purposes; ask your health care provider or pharmacist if you have questions. COMMON BRAND NAME(S): Afluria, Afluria Quadrivalent, Agriflu, Alfuria, FLUAD, Fluarix, Fluarix Quadrivalent, Flublok, Flublok Quadrivalent, FLUCELVAX, FLUCELVAX Quadrivalent, Flulaval, Flulaval Quadrivalent, Fluvirin, Fluzone, Fluzone High-Dose, Fluzone Intradermal, Fluzone Quadrivalent What should I tell my health care provider before I take this medicine? They need to know if you have any of these conditions:  bleeding disorder like hemophilia  fever or infection  Guillain-Barre syndrome or other neurological problems  immune system problems  infection with the human immunodeficiency virus (HIV) or AIDS  low blood platelet counts  multiple sclerosis  an unusual or allergic reaction to influenza virus vaccine, latex, other medicines, foods, dyes, or preservatives. Different brands of vaccines contain different allergens. Some may contain latex or eggs. Talk to your doctor about your allergies to make sure that you get the right vaccine.  pregnant or trying to get pregnant  breast-feeding How should I use this medicine? This vaccine is for injection into a muscle or under the skin. It is given by a health care professional. A copy of Vaccine Information Statements will be given before each vaccination. Read this sheet carefully each time. The sheet may change frequently. Talk to your healthcare provider to see which vaccines are right for you. Some vaccines should not be used in all age groups. Overdosage: If you think you have taken too much of this medicine contact a poison control center or emergency room at  once. NOTE: This medicine is only for you. Do not share this medicine with others. What if I miss a dose? This does not apply. What may interact with this medicine?  chemotherapy or radiation therapy  medicines that lower your immune system like etanercept, anakinra, infliximab, and adalimumab  medicines that treat or prevent blood clots like warfarin  phenytoin  steroid medicines like prednisone or cortisone  theophylline  vaccines This list may not describe all possible interactions. Give your health care provider a list of all the medicines, herbs, non-prescription drugs, or dietary supplements you use. Also tell them if you smoke, drink alcohol, or use illegal drugs. Some items may interact with your medicine. What should I watch for while using this medicine? Report any side effects that do not go away within 3 days to your doctor or health care professional. Call your health care provider if any unusual symptoms occur within 6 weeks of receiving this vaccine. You may still catch the flu, but the illness is not usually as bad. You cannot get the flu from the vaccine. The vaccine will not protect against colds or other illnesses that may cause fever. The vaccine is needed every year. What side effects may I notice from receiving this medicine? Side effects that you should report to your doctor or health care professional as soon as possible:  allergic reactions like skin rash, itching or hives, swelling   of the face, lips, or tongue Side effects that usually do not require medical attention (report to your doctor or health care professional if they continue or are bothersome):  fever  headache  muscle aches and pains  pain, tenderness, redness, or swelling at the injection site  tiredness This list may not describe all possible side effects. Call your doctor for medical advice about side effects. You may report side effects to FDA at 1-800-FDA-1088. Where should I keep my  medicine? The vaccine will be given by a health care professional in a clinic, pharmacy, doctor's office, or other health care setting. You will not be given vaccine doses to store at home. NOTE: This sheet is a summary. It may not cover all possible information. If you have questions about this medicine, talk to your doctor, pharmacist, or health care provider.  2020 Elsevier/Gold Standard (2018-03-27 08:45:43)  

## 2020-03-04 ENCOUNTER — Telehealth: Payer: Self-pay

## 2020-03-04 NOTE — Telephone Encounter (Signed)
   [facility] Pre-operative Risk Assessment    HEARTCARE STAFF: - Please ensure there is not already an duplicate clearance open for this procedure. - Under Visit Info/Reason for Call, type in Other and utilize the format Clearance MM/DD/YY or Clearance TBD. Do not use dashes or single digits. - If request is for dental extraction, please clarify the # of teeth to be extracted.  Request for surgical clearance:  1. What type of surgery is being performed? Brain Aneruysm  2. When is this surgery scheduled? TBD   3. What type of clearance is required (medical clearance vs. Pharmacy clearance to hold med vs. Both)? Medical  4. Are there any medications that need to be held prior to surgery and how long?None    5. Practice name and name of physician performing surgery?  [facility] specialist Dr. Luanne Bras  6. What is the office phone number? [phone]   7.   What is the office fax number? [phone]  8.   Anesthesia type (None, local, MAC, general) ? General   Monia Pouch 03/04/2020, 4:36 PM  _________________________________________________________________   (provider comments below)

## 2020-03-05 NOTE — Telephone Encounter (Signed)
I s/w [facility] specialist Dr. Luanne Bras and clarified surgery to be performed will be brain aneurysm embolization. I will forward updated notes to pre op team.

## 2020-03-05 NOTE — Telephone Encounter (Signed)
   Primary Cardiologist: Evalina Field, MD  Chart reviewed as part of pre-operative protocol coverage. Patient was contacted 03/05/2020 in reference to pre-operative risk assessment for pending surgery as outlined below.  Bethany [name] was last seen on 12/12/19 by Dr. Audie Box.  Since that day, Bethany [name] has done well.  She does not have a history of MI. She can complete more than 4.0 METS without angina.   Therefore, based on ACC/AHA guidelines, the patient would be at acceptable risk for the planned procedure without further cardiovascular testing.   The patient was advised that if she develops new symptoms prior to surgery to contact our office to arrange for a follow-up visit, and she verbalized understanding.  I will route this recommendation to the requesting party via Epic fax function and remove from pre-op pool. Please call with questions.  Tami Lin Aiven Kampe, PA 03/05/2020, 10:45 AM

## 2020-03-13 ENCOUNTER — Inpatient Hospital Stay: Payer: Medicare Other

## 2020-03-13 ENCOUNTER — Other Ambulatory Visit: Payer: Self-pay

## 2020-03-13 VITALS — BP 155/53 | HR 57 | Temp 98.3°F | Resp 18

## 2020-03-13 DIAGNOSIS — Q2733 Arteriovenous malformation of digestive system vessel: Secondary | ICD-10-CM | POA: Diagnosis not present

## 2020-03-13 DIAGNOSIS — K5520 Angiodysplasia of colon without hemorrhage: Secondary | ICD-10-CM

## 2020-03-13 DIAGNOSIS — D500 Iron deficiency anemia secondary to blood loss (chronic): Secondary | ICD-10-CM

## 2020-03-13 DIAGNOSIS — Z95828 Presence of other vascular implants and grafts: Secondary | ICD-10-CM

## 2020-03-13 LAB — CBC WITH DIFFERENTIAL (CANCER CENTER ONLY)
Abs Immature Granulocytes: 0.02 10*3/uL (ref 0.00–0.07)
Basophils Absolute: 0 10*3/uL (ref 0.0–0.1)
Basophils Relative: 0 %
Eosinophils Absolute: 0.2 10*3/uL (ref 0.0–0.5)
Eosinophils Relative: 2 %
HCT: 34.3 % — ABNORMAL LOW (ref 36.0–46.0)
Hemoglobin: 10.1 g/dL — ABNORMAL LOW (ref 12.0–15.0)
Immature Granulocytes: 0 %
Lymphocytes Relative: 26 %
Lymphs Abs: 2.1 10*3/uL (ref 0.7–4.0)
MCH: 24.6 pg — ABNORMAL LOW (ref 26.0–34.0)
MCHC: 29.4 g/dL — ABNORMAL LOW (ref 30.0–36.0)
MCV: 83.5 fL (ref 80.0–100.0)
Monocytes Absolute: 0.8 10*3/uL (ref 0.1–1.0)
Monocytes Relative: 10 %
Neutro Abs: 4.9 10*3/uL (ref 1.7–7.7)
Neutrophils Relative %: 62 %
Platelet Count: 238 10*3/uL (ref 150–400)
RBC: 4.11 MIL/uL (ref 3.87–5.11)
RDW: 16.3 % — ABNORMAL HIGH (ref 11.5–15.5)
WBC Count: 8 10*3/uL (ref 4.0–10.5)
nRBC: 0 % (ref 0.0–0.2)

## 2020-03-13 LAB — IRON AND TIBC
Iron: 54 ug/dL (ref 41–142)
Saturation Ratios: 16 % — ABNORMAL LOW (ref 21–57)
TIBC: 339 ug/dL (ref 236–444)
UIBC: 285 ug/dL (ref 120–384)

## 2020-03-13 LAB — FERRITIN: Ferritin: 267 ng/mL (ref 11–307)

## 2020-03-13 MED ORDER — SODIUM CHLORIDE 0.9 % IV SOLN
INTRAVENOUS | Status: DC
  Filled 2020-03-13: qty 250

## 2020-03-13 MED ORDER — HEPARIN SOD (PORK) LOCK FLUSH 100 UNIT/ML IV SOLN
500.0000 [IU] | Freq: Once | INTRAVENOUS | Status: AC
  Administered 2020-03-13: 500 [IU] via INTRAVENOUS
  Filled 2020-03-13: qty 5

## 2020-03-13 MED ORDER — SODIUM CHLORIDE 0.9 % IV SOLN
400.0000 mg | Freq: Once | INTRAVENOUS | Status: AC
  Administered 2020-03-13: 400 mg via INTRAVENOUS
  Filled 2020-03-13: qty 20

## 2020-03-13 MED ORDER — SODIUM CHLORIDE 0.9% FLUSH
10.0000 mL | Freq: Once | INTRAVENOUS | Status: AC
  Administered 2020-03-13: 10 mL via INTRAVENOUS
  Filled 2020-03-13: qty 10

## 2020-03-13 MED ORDER — SODIUM CHLORIDE 0.9% FLUSH
10.0000 mL | INTRAVENOUS | Status: AC | PRN
  Administered 2020-03-13: 10 mL
  Filled 2020-03-13: qty 10

## 2020-03-13 NOTE — Patient Instructions (Signed)

## 2020-03-13 NOTE — Patient Instructions (Signed)
Iron Sucrose injection What is this medicine? IRON SUCROSE (AHY ern SOO krohs) is an iron complex. Iron is used to make healthy red blood cells, which carry oxygen and nutrients throughout the body. This medicine is used to treat iron deficiency anemia in people with chronic kidney disease. This medicine may be used for other purposes; ask your health care provider or pharmacist if you have questions. COMMON BRAND NAME(S): Venofer What should I tell my health care provider before I take this medicine? They need to know if you have any of these conditions:  anemia not caused by low iron levels  heart disease  high levels of iron in the blood  kidney disease  liver disease  an unusual or allergic reaction to iron, other medicines, foods, dyes, or preservatives  pregnant or trying to get pregnant  breast-feeding How should I use this medicine? This medicine is for infusion into a vein. It is given by a health care professional in a hospital or clinic setting. Talk to your pediatrician regarding the use of this medicine in children. While this drug may be prescribed for children as young as 2 years for selected conditions, precautions do apply. Overdosage: If you think you have taken too much of this medicine contact a poison control center or emergency room at once. NOTE: This medicine is only for you. Do not share this medicine with others. What if I miss a dose? It is important not to miss your dose. Call your doctor or health care professional if you are unable to keep an appointment. What may interact with this medicine? Do not take this medicine with any of the following medications:  deferoxamine  dimercaprol  other iron products This medicine may also interact with the following medications:  chloramphenicol  deferasirox This list may not describe all possible interactions. Give your health care provider a list of all the medicines, herbs, non-prescription drugs, or  dietary supplements you use. Also tell them if you smoke, drink alcohol, or use illegal drugs. Some items may interact with your medicine. What should I watch for while using this medicine? Visit your doctor or healthcare professional regularly. Tell your doctor or healthcare professional if your symptoms do not start to get better or if they get worse. You may need blood work done while you are taking this medicine. You may need to follow a special diet. Talk to your doctor. Foods that contain iron include: whole grains/cereals, dried fruits, beans, or peas, leafy green vegetables, and organ meats (liver, kidney). What side effects may I notice from receiving this medicine? Side effects that you should report to your doctor or health care professional as soon as possible:  allergic reactions like skin rash, itching or hives, swelling of the face, lips, or tongue  breathing problems  changes in blood pressure  cough  fast, irregular heartbeat  feeling faint or lightheaded, falls  fever or chills  flushing, sweating, or hot feelings  joint or muscle aches/pains  seizures  swelling of the ankles or feet  unusually weak or tired Side effects that usually do not require medical attention (report to your doctor or health care professional if they continue or are bothersome):  diarrhea  feeling achy  headache  irritation at site where injected  nausea, vomiting  stomach upset  tiredness This list may not describe all possible side effects. Call your doctor for medical advice about side effects. You may report side effects to FDA at 1-800-FDA-1088. Where should I keep   my medicine? This drug is given in a hospital or clinic and will not be stored at home. NOTE: This sheet is a summary. It may not cover all possible information. If you have questions about this medicine, talk to your doctor, pharmacist, or health care provider.  2020 Elsevier/Gold Standard (2011-02-10  17:14:35)  Influenza Virus Vaccine injection What is this medicine? INFLUENZA VIRUS VACCINE (in floo EN zuh VAHY ruhs vak SEEN) helps to reduce the risk of getting influenza also known as the flu. The vaccine only helps protect you against some strains of the flu. This medicine may be used for other purposes; ask your health care provider or pharmacist if you have questions. COMMON BRAND NAME(S): Afluria, Afluria Quadrivalent, Agriflu, Alfuria, FLUAD, Fluarix, Fluarix Quadrivalent, Flublok, Flublok Quadrivalent, FLUCELVAX, FLUCELVAX Quadrivalent, Flulaval, Flulaval Quadrivalent, Fluvirin, Fluzone, Fluzone High-Dose, Fluzone Intradermal, Fluzone Quadrivalent What should I tell my health care provider before I take this medicine? They need to know if you have any of these conditions:  bleeding disorder like hemophilia  fever or infection  Guillain-Barre syndrome or other neurological problems  immune system problems  infection with the human immunodeficiency virus (HIV) or AIDS  low blood platelet counts  multiple sclerosis  an unusual or allergic reaction to influenza virus vaccine, latex, other medicines, foods, dyes, or preservatives. Different brands of vaccines contain different allergens. Some may contain latex or eggs. Talk to your doctor about your allergies to make sure that you get the right vaccine.  pregnant or trying to get pregnant  breast-feeding How should I use this medicine? This vaccine is for injection into a muscle or under the skin. It is given by a health care professional. A copy of Vaccine Information Statements will be given before each vaccination. Read this sheet carefully each time. The sheet may change frequently. Talk to your healthcare provider to see which vaccines are right for you. Some vaccines should not be used in all age groups. Overdosage: If you think you have taken too much of this medicine contact a poison control center or emergency room at  once. NOTE: This medicine is only for you. Do not share this medicine with others. What if I miss a dose? This does not apply. What may interact with this medicine?  chemotherapy or radiation therapy  medicines that lower your immune system like etanercept, anakinra, infliximab, and adalimumab  medicines that treat or prevent blood clots like warfarin  phenytoin  steroid medicines like prednisone or cortisone  theophylline  vaccines This list may not describe all possible interactions. Give your health care provider a list of all the medicines, herbs, non-prescription drugs, or dietary supplements you use. Also tell them if you smoke, drink alcohol, or use illegal drugs. Some items may interact with your medicine. What should I watch for while using this medicine? Report any side effects that do not go away within 3 days to your doctor or health care professional. Call your health care provider if any unusual symptoms occur within 6 weeks of receiving this vaccine. You may still catch the flu, but the illness is not usually as bad. You cannot get the flu from the vaccine. The vaccine will not protect against colds or other illnesses that may cause fever. The vaccine is needed every year. What side effects may I notice from receiving this medicine? Side effects that you should report to your doctor or health care professional as soon as possible:  allergic reactions like skin rash, itching or hives, swelling   of the face, lips, or tongue Side effects that usually do not require medical attention (report to your doctor or health care professional if they continue or are bothersome):  fever  headache  muscle aches and pains  pain, tenderness, redness, or swelling at the injection site  tiredness This list may not describe all possible side effects. Call your doctor for medical advice about side effects. You may report side effects to FDA at 1-800-FDA-1088. Where should I keep my  medicine? The vaccine will be given by a health care professional in a clinic, pharmacy, doctor's office, or other health care setting. You will not be given vaccine doses to store at home. NOTE: This sheet is a summary. It may not cover all possible information. If you have questions about this medicine, talk to your doctor, pharmacist, or health care provider.  2020 Elsevier/Gold Standard (2018-03-27 08:45:43)  

## 2020-03-24 ENCOUNTER — Telehealth: Payer: Self-pay | Admitting: Student

## 2020-03-24 NOTE — Telephone Encounter (Signed)
NIR.  Patient is scheduled for a NIR procedure. She has an iodine allergy, requiring pre-medications for procedures with iodine dye.   Faxed filled prescriptions to [facility] [phone]) at [phone]: 1- Prednisone 50 mg tablets; take one tablet by mouth 13 hours, 7 hours, and 1 hour prior to procedure on 04/27/2020; dispense 3 tablets with 0 refills. 2- Benadryl 50 mg tablets; take one tablet by mouth 1 hour prior to procedure on 04/27/2020; dispense 1 tablet with 0 refills.  Also called [facility] [phone]) at 1600 and left VM to fill additional prescription- Plavix 75 mg tablets, take one tablet by mouth once daily, dispense 30 tablets with 3 refills.   Bea Graff Jadin Kagel, PA-C 03/24/2020, 4:06 PM

## 2020-03-25 NOTE — Progress Notes (Signed)
[place]   [phone] [phone]   Clinic Follow up Note   Patient Care Team: Wenda Low, MD as PCP - General (Internal Medicine) O'Neal, Cassie Freer, MD as PCP - Cardiology (Cardiology) Truitt Merle, MD as Consulting Physician (Hematology) Angelia Mould, MD as Consulting Physician (Vascular Surgery) Koleen Distance, MD as Referring Physician (Gastroenterology)  Date of Service:  03/27/2020  CHIEF COMPLAINT: f/u of anemia    CURRENT THERAPY:  IV Feraheme as needed (if ferritin <100). Changed to IV Venofer on 01/02/19   INTERVAL HISTORY:  Bethany [name] is here for a follow up of anemia. She presents to the clinic alone. She notes she feels she has the flu. She notes congestion for the past 12 days. She also has cough. She notes she saw her PCP about this and thought this was bronchitis. He started her on antibiotics and cough drops. She denies fever. He did not do flu test. She notes she feels better on antibiotics. She denies overt bleeding and has not noticed her stool in the last 2 weeks. She feels her stool color is occasionally dark.    REVIEW OF SYSTEMS:   Constitutional: Denies fevers, chills or abnormal weight loss Eyes: Denies blurriness of vision Ears, nose, mouth, throat, and face: Denies mucositis or sore throat  Respiratory: Denies dyspnea or wheezes (+) Improved cough (+) Chest and nasal congestion  Cardiovascular: Denies palpitation, chest discomfort or lower extremity swelling Gastrointestinal:  Denies nausea, heartburn or change in bowel habits Skin: Denies abnormal skin rashes Lymphatics: Denies new lymphadenopathy or easy bruising Neurological:Denies numbness, tingling or new weaknesses Behavioral/Psych: Mood is stable, no new changes  All other systems were reviewed with the patient and are negative.  MEDICAL HISTORY:  Past Medical History:  Diagnosis Date  . Acute renal failure ([place])   . Arthritis   .  Diabetes mellitus without complication ([place])    type II   . Hypercalcemia   . Hyperlipidemia   . Hypertension   . Renal disorder   . Single kidney   . Thrombosis   . Tobacco abuse     SURGICAL HISTORY: Past Surgical History:  Procedure Laterality Date  . ABDOMINAL HYSTERECTOMY    . blood clots removed from descending aorta     . CHOLECYSTECTOMY    . COLONOSCOPY    . COLONOSCOPY WITH PROPOFOL N/A 04/17/2017   Procedure: COLONOSCOPY WITH PROPOFOL;  Surgeon: Wilford Corner, MD;  Location: [facility];  Service: Endoscopy;  Laterality: N/A;  . ESOPHAGOGASTRODUODENOSCOPY (EGD) WITH PROPOFOL N/A 04/17/2017   Procedure: ESOPHAGOGASTRODUODENOSCOPY (EGD) WITH PROPOFOL;  Surgeon: Wilford Corner, MD;  Location: [facility];  Service: Endoscopy;  Laterality: N/A;  . IR ANGIO INTRA EXTRACRAN SEL COM CAROTID INNOMINATE BILAT MOD SED  02/06/2020  . IR ANGIO VERTEBRAL SEL VERTEBRAL BILAT MOD SED  02/06/2020  . IR IMAGING GUIDED PORT INSERTION  05/01/2019  . IR US GUIDE VASC ACCESS RIGHT  02/06/2020  . NEPHRECTOMY RECIPIENT      I have reviewed the social history and family history with the patient and they are unchanged from previous note.  ALLERGIES:  is allergic to contrast media [iodinated diagnostic agents].  MEDICATIONS:  Current Outpatient Medications  Medication Sig Dispense Refill  . allopurinol (ZYLOPRIM) 100 MG tablet Take 100 mg by mouth daily.    Marland Kitchen amLODipine (NORVASC) 10 MG tablet Take 10 mg by mouth daily.    . APPLE CIDER VINEGAR PO Take 1 tablet by mouth daily.    Marland Kitchen  aspirin EC 81 MG EC tablet Take 1 tablet (81 mg total) by mouth daily. Swallow whole. 30 tablet 11  . atorvastatin (LIPITOR) 80 MG tablet Take 1 tablet (80 mg total) by mouth daily. 30 tablet 11  . baclofen (LIORESAL) 10 MG tablet Take 10 mg by mouth 3 (three) times daily as needed for muscle spasms.    . buprenorphine (BUTRANS) 20 MCG/HR PTWK Place 1 patch onto the skin every Monday.     . Dulaglutide  (TRULICITY) 1.5 ZJ/6.9CV SOPN Inject 1.5 mg into the skin every Monday.     . ezetimibe (ZETIA) 10 MG tablet Take 10 mg by mouth daily.    . feeding supplement, ENSURE ENLIVE, (ENSURE ENLIVE) LIQD Take 237 mLs by mouth 2 (two) times daily between meals. 237 mL 12  . furosemide (LASIX) 40 MG tablet Take 1 tablet (40 mg total) by mouth daily as needed. (Patient taking differently: Take 40 mg by mouth daily as needed for fluid. ) 45 tablet 3  . gabapentin (NEURONTIN) 600 MG tablet Take 600 mg by mouth 4 (four) times daily.     . insulin glargine, 1 Unit Dial, (TOUJEO SOLOSTAR) 300 UNIT/ML Solostar Pen Inject 20 Units into the skin at bedtime.     . Ipratropium-Albuterol (COMBIVENT) 20-100 MCG/ACT AERS respimat Inhale 1 puff into the lungs every 6 (six) hours as needed for wheezing or shortness of breath. 1 Inhaler 0  . levothyroxine (SYNTHROID) 75 MCG tablet Take 75 mcg by mouth daily.    Marland Kitchen lidocaine-prilocaine (EMLA) cream Apply 1 application topically as needed. (Patient taking differently: Apply 1 application topically daily as needed (port access). ) 30 g 1  . losartan (COZAAR) 25 MG tablet Take 1 tablet (25 mg total) by mouth daily. 30 tablet 11  . metoprolol (LOPRESSOR) 50 MG tablet Take 50 mg by mouth 2 (two) times daily.     . Multiple Vitamin (MULTIVITAMIN WITH MINERALS) TABS tablet Take 1 tablet by mouth daily.    Marland Kitchen NARCAN 4 MG/0.1ML LIQD nasal spray kit Place 1 spray into the nose as needed for opioid reversal.    . oxyCODONE-acetaminophen (PERCOCET) 10-325 MG tablet Take 1 tablet by mouth every 6 (six) hours as needed (breakthrough pain).     . pantoprazole (PROTONIX) 40 MG tablet Take 40 mg by mouth daily.    . pregabalin (LYRICA) 25 MG capsule Take 25 mg by mouth 2 (two) times daily.     Marland Kitchen senna (SENOKOT) 8.6 MG TABS tablet Take 8.6 mg by mouth daily.     . Vitamin D, Ergocalciferol, (DRISDOL) 1.25 MG (50000 UNIT) CAPS capsule Take 50,000 Units by mouth every Monday.     Marland Kitchen XTAMPZA ER 27  MG C12A Take 27 mg by mouth 2 (two) times daily.      No current facility-administered medications for this visit.    PHYSICAL EXAMINATION: ECOG PERFORMANCE STATUS: 2 - Symptomatic, <50% confined to bed  Vitals:   03/27/20 1015  BP: (!) 155/55  Pulse: 73  Resp: 18  Temp: 97.8 F (36.6 C)  SpO2: 96%   Filed Weights   03/27/20 1015  Weight: 178 lb 6.4 oz (80.9 kg)    Due to COVID19 we will limit examination to appearance. Patient had no complaints.  GENERAL:alert, no distress and comfortable SKIN: skin color normal, no rashes or significant lesions EYES: normal, Conjunctiva are pink and non-injected, sclera clear  NEURO: alert & oriented x 3 with fluent speech   LABORATORY DATA:  I  have reviewed the data as listed CBC Latest Ref Rng & Units 03/27/2020 03/13/2020 02/28/2020  WBC 4.0 - 10.5 K/uL 9.2 8.0 7.5  Hemoglobin 12.0 - 15.0 g/dL 9.1(L) 10.1(L) 10.2(L)  Hematocrit 36 - 46 % 31.0(L) 34.3(L) 34.5(L)  Platelets 150 - 400 K/uL 285 238 211     CMP Latest Ref Rng & Units 02/06/2020 02/06/2020 01/24/2020  Glucose 70 - 99 mg/dL 269(H) 410(H) 232(H)  BUN 8 - 23 mg/dL 31(H) 28(H) 26(H)  Creatinine 0.44 - 1.00 mg/dL 1.30(H) 1.56(H) 1.21(H)  Sodium 135 - 145 mmol/L 142 137 139  Potassium 3.5 - 5.1 mmol/L 5.4(H) 6.4(HH) 4.6  Chloride 98 - 111 mmol/L 108 103 107  CO2 22 - 32 mmol/L - 24 23  Calcium 8.9 - 10.3 mg/dL - 10.4(H) 10.8(H)  Total Protein 6.5 - 8.1 g/dL - - 7.2  Total Bilirubin 0.3 - 1.2 mg/dL - - 0.3  Alkaline Phos 38 - 126 U/L - - 237(H)  AST 15 - 41 U/L - - 28  ALT 0 - 44 U/L - - 56(H)      RADIOGRAPHIC STUDIES: I have personally reviewed the radiological images as listed and agreed with the findings in the report. No results found.   ASSESSMENT & PLAN:  Bethany [name] is a 69 y.o. female with   1. Iron deficient anemia, secondary to chronic blood loss from GI AVM  -Pt has intestinal AVM history with associated chronic GI blood loss resulting in iron  deficiency and blood loss anemia. She has required blood transfusions in the past in 2013 and in 2018 and frequent hospital admission for anemia. -She has intermittent epistaxis, but no skin or mucosa telangiectasia, no significant family history of bleeding disorder, she does not meet the diagnosis criteria for hereditary hemorrhagic telangiectasia. -Her lastcolonoscopy/endoscopy was 04/2017. -She is not on oral iron, due to the lack of benefit. -Currently being treated withIV Iron to prevent significant anemiawith goal of Ferritin 100-200 range. She was switched to IV Venofer on 8/19/20due to her insurance coverage.Increased to 49m with each infusion from 02/14/20. She has been tolerating well  -Labs reviewed, Hg 9.1. Iron panel still pending. Proceed with 4018mVenofer today.  -Continue 40060menofer every 4 weeks. Will hold Venofer if previous ferritin>300 -F/u in 12 weeks.    2. Chronic GI Bleeding, intermittent epistaxis -Has intestinal AVM history  -Lastcolonoscopy/EGD by Dr. SchMichail Sermon 04/17/17 showed gastritis, diverticulosis and internal hemorrhoids, all benign with no obvious signs of bleeding at that time. -She does not meet the diagnosis criteria for hereditary hemorrhagic telangiectasia (HHT) -She [facility]  -She has no recent overt bleeding, but has occasional dark stool.   3. H/o of arterial thrombosis, Ischemic stroke 11/2019, Left Brain Aneurysm (4mm23md 2mm)66m/p unilateral nephrectomy due to damage from arterial thrombosis -Managed by vascular physician, Dr. DixonDoren Custardneurologist Dr SethiLeonie Manevious work-up for lupus anticoagulantand antiphospholipid syndromewerenegative, normal protein S and C level. -Continue aspirin 81mg 67my. Plavix has been held since previous significant GI bleeding.  -She notes she still has mild speech deficit (difficulty finding her words).  -On 02/06/20 Dr  DeveshAllegra Grana a second brain aneurysm. She may have procedure later this year.   4. Smoking Cessation, Lung Nodules -Currently smokes 1.5packs over 2 days currently. She has been smoking for 40 years. -IhaveJaneal Holmestedly encouraged her to continue to try to quit completely, she is willing to try.  -  Her CT Chest from 07/12/19 shows Stable 7 mm nodule in left lower lobe. Also shows New 4 mm nodule is noted in left upper lobe and 12 mm sub solid density is noted in left lung apex.   5. COPD, HTN, CKD stage III, DM with Peripheral Neuropathy, Chronic Back Pain  -She is currently on 644m Gabapentin 4 times a day. -[facility]  6. Cancer Screening  -Her 07/12/19 CT chest also showed 228mmass right breast mass which Pt notes she has had for years. Previous diagnostic mammogram showed likely benign, and she previously had biopsy in 2014 which showed fibroadenoma. I highly recommend she have annual mammogram for better screening.   7. URI  -She notes for the past 12 days she has nasal and chest congestion with cough and rhinorrhea, no fever.  -She feels she may have the flu. Her PCP has seen her and suspects bronchitis. No test for flu was done. He started her on antibiotics.  -Her symptoms are improving on antibiotics.    PLAN: -Proceed with Venofer 40018moday  -Lab, flush, and IV Venofer 400m34m 4, 8, 12 weeks, will hold Venofer if previous ferritin>300 -f/u in 12 weeks    No problem-specific Assessment & Plan notes found for this encounter.   No orders of the defined types were placed in this encounter.  All questions were answered. The patient knows to call the clinic with any problems, questions or concerns. No barriers to learning was detected. The total time spent in the appointment was 25 minutes.     Nyshaun Standage Truitt Merle 03/27/2020   I, AmoyJoslyn Devon acting as scribe for Makhai Fulco Truitt Merle.   I have reviewed the above  documentation for accuracy and completeness, and I agree with the above.

## 2020-03-27 ENCOUNTER — Other Ambulatory Visit: Payer: Self-pay

## 2020-03-27 ENCOUNTER — Inpatient Hospital Stay: Payer: Medicare Other | Attending: Hematology | Admitting: Hematology

## 2020-03-27 ENCOUNTER — Encounter: Payer: Self-pay | Admitting: Hematology

## 2020-03-27 ENCOUNTER — Telehealth: Payer: Self-pay | Admitting: Hematology

## 2020-03-27 ENCOUNTER — Inpatient Hospital Stay: Payer: Medicare Other

## 2020-03-27 VITALS — BP 155/55 | HR 73 | Temp 97.8°F | Resp 18 | Ht 65.0 in | Wt 178.4 lb

## 2020-03-27 VITALS — BP 137/48 | HR 68 | Temp 99.5°F | Resp 18

## 2020-03-27 DIAGNOSIS — N1832 Chronic kidney disease, stage 3b: Secondary | ICD-10-CM | POA: Diagnosis not present

## 2020-03-27 DIAGNOSIS — D500 Iron deficiency anemia secondary to blood loss (chronic): Secondary | ICD-10-CM

## 2020-03-27 DIAGNOSIS — D649 Anemia, unspecified: Secondary | ICD-10-CM

## 2020-03-27 DIAGNOSIS — E1122 Type 2 diabetes mellitus with diabetic chronic kidney disease: Secondary | ICD-10-CM | POA: Insufficient documentation

## 2020-03-27 DIAGNOSIS — K922 Gastrointestinal hemorrhage, unspecified: Secondary | ICD-10-CM | POA: Diagnosis present

## 2020-03-27 DIAGNOSIS — K5520 Angiodysplasia of colon without hemorrhage: Secondary | ICD-10-CM

## 2020-03-27 DIAGNOSIS — I129 Hypertensive chronic kidney disease with stage 1 through stage 4 chronic kidney disease, or unspecified chronic kidney disease: Secondary | ICD-10-CM | POA: Diagnosis not present

## 2020-03-27 DIAGNOSIS — Q2733 Arteriovenous malformation of digestive system vessel: Secondary | ICD-10-CM | POA: Diagnosis present

## 2020-03-27 DIAGNOSIS — J069 Acute upper respiratory infection, unspecified: Secondary | ICD-10-CM | POA: Diagnosis not present

## 2020-03-27 DIAGNOSIS — J449 Chronic obstructive pulmonary disease, unspecified: Secondary | ICD-10-CM | POA: Diagnosis not present

## 2020-03-27 DIAGNOSIS — Z95828 Presence of other vascular implants and grafts: Secondary | ICD-10-CM

## 2020-03-27 DIAGNOSIS — N183 Chronic kidney disease, stage 3 unspecified: Secondary | ICD-10-CM | POA: Insufficient documentation

## 2020-03-27 DIAGNOSIS — G8929 Other chronic pain: Secondary | ICD-10-CM | POA: Insufficient documentation

## 2020-03-27 LAB — CBC WITH DIFFERENTIAL (CANCER CENTER ONLY)
Abs Immature Granulocytes: 0.09 10*3/uL — ABNORMAL HIGH (ref 0.00–0.07)
Basophils Absolute: 0 10*3/uL (ref 0.0–0.1)
Basophils Relative: 0 %
Eosinophils Absolute: 0.2 10*3/uL (ref 0.0–0.5)
Eosinophils Relative: 2 %
HCT: 31 % — ABNORMAL LOW (ref 36.0–46.0)
Hemoglobin: 9.1 g/dL — ABNORMAL LOW (ref 12.0–15.0)
Immature Granulocytes: 1 %
Lymphocytes Relative: 9 %
Lymphs Abs: 0.8 10*3/uL (ref 0.7–4.0)
MCH: 24.3 pg — ABNORMAL LOW (ref 26.0–34.0)
MCHC: 29.4 g/dL — ABNORMAL LOW (ref 30.0–36.0)
MCV: 82.7 fL (ref 80.0–100.0)
Monocytes Absolute: 0.8 10*3/uL (ref 0.1–1.0)
Monocytes Relative: 8 %
Neutro Abs: 7.3 10*3/uL (ref 1.7–7.7)
Neutrophils Relative %: 80 %
Platelet Count: 285 10*3/uL (ref 150–400)
RBC: 3.75 MIL/uL — ABNORMAL LOW (ref 3.87–5.11)
RDW: 17.1 % — ABNORMAL HIGH (ref 11.5–15.5)
WBC Count: 9.2 10*3/uL (ref 4.0–10.5)
nRBC: 0.5 % — ABNORMAL HIGH (ref 0.0–0.2)

## 2020-03-27 LAB — FERRITIN: Ferritin: 432 ng/mL — ABNORMAL HIGH (ref 11–307)

## 2020-03-27 LAB — IRON AND TIBC
Iron: 51 ug/dL (ref 41–142)
Saturation Ratios: 17 % — ABNORMAL LOW (ref 21–57)
TIBC: 296 ug/dL (ref 236–444)
UIBC: 245 ug/dL (ref 120–384)

## 2020-03-27 MED ORDER — SODIUM CHLORIDE 0.9 % IV SOLN
INTRAVENOUS | Status: DC
  Filled 2020-03-27: qty 250

## 2020-03-27 MED ORDER — SODIUM CHLORIDE 0.9 % IV SOLN
400.0000 mg | Freq: Once | INTRAVENOUS | Status: AC
  Administered 2020-03-27: 400 mg via INTRAVENOUS
  Filled 2020-03-27: qty 20

## 2020-03-27 MED ORDER — SODIUM CHLORIDE 0.9% FLUSH
10.0000 mL | Freq: Once | INTRAVENOUS | Status: AC
  Administered 2020-03-27: 10 mL
  Filled 2020-03-27: qty 10

## 2020-03-27 MED ORDER — SODIUM CHLORIDE 0.9% FLUSH
10.0000 mL | INTRAVENOUS | Status: DC | PRN
  Filled 2020-03-27: qty 10

## 2020-03-27 MED ORDER — HEPARIN SOD (PORK) LOCK FLUSH 100 UNIT/ML IV SOLN
500.0000 [IU] | Freq: Once | INTRAVENOUS | Status: AC
  Administered 2020-03-27: 500 [IU]
  Filled 2020-03-27: qty 5

## 2020-03-27 NOTE — Progress Notes (Signed)
Patient chose to stay only 15 minutes post observation.    Per Dr. Burr Medico OK to discharge patient with increased temperature, if patient asymptomatic.  Patient had no complaints and ambulated from facility in stable condition.

## 2020-03-27 NOTE — Patient Instructions (Signed)
Iron Sucrose injection What is this medicine? IRON SUCROSE (AHY ern SOO krohs) is an iron complex. Iron is used to make healthy red blood cells, which carry oxygen and nutrients throughout the body. This medicine is used to treat iron deficiency anemia in people with chronic kidney disease. This medicine may be used for other purposes; ask your health care provider or pharmacist if you have questions. COMMON BRAND NAME(S): Venofer What should I tell my health care provider before I take this medicine? They need to know if you have any of these conditions:  anemia not caused by low iron levels  heart disease  high levels of iron in the blood  kidney disease  liver disease  an unusual or allergic reaction to iron, other medicines, foods, dyes, or preservatives  pregnant or trying to get pregnant  breast-feeding How should I use this medicine? This medicine is for infusion into a vein. It is given by a health care professional in a hospital or clinic setting. Talk to your pediatrician regarding the use of this medicine in children. While this drug may be prescribed for children as young as 2 years for selected conditions, precautions do apply. Overdosage: If you think you have taken too much of this medicine contact a poison control center or emergency room at once. NOTE: This medicine is only for you. Do not share this medicine with others. What if I miss a dose? It is important not to miss your dose. Call your doctor or health care professional if you are unable to keep an appointment. What may interact with this medicine? Do not take this medicine with any of the following medications:  deferoxamine  dimercaprol  other iron products This medicine may also interact with the following medications:  chloramphenicol  deferasirox This list may not describe all possible interactions. Give your health care provider a list of all the medicines, herbs, non-prescription drugs, or  dietary supplements you use. Also tell them if you smoke, drink alcohol, or use illegal drugs. Some items may interact with your medicine. What should I watch for while using this medicine? Visit your doctor or healthcare professional regularly. Tell your doctor or healthcare professional if your symptoms do not start to get better or if they get worse. You may need blood work done while you are taking this medicine. You may need to follow a special diet. Talk to your doctor. Foods that contain iron include: whole grains/cereals, dried fruits, beans, or peas, leafy green vegetables, and organ meats (liver, kidney). What side effects may I notice from receiving this medicine? Side effects that you should report to your doctor or health care professional as soon as possible:  allergic reactions like skin rash, itching or hives, swelling of the face, lips, or tongue  breathing problems  changes in blood pressure  cough  fast, irregular heartbeat  feeling faint or lightheaded, falls  fever or chills  flushing, sweating, or hot feelings  joint or muscle aches/pains  seizures  swelling of the ankles or feet  unusually weak or tired Side effects that usually do not require medical attention (report to your doctor or health care professional if they continue or are bothersome):  diarrhea  feeling achy  headache  irritation at site where injected  nausea, vomiting  stomach upset  tiredness This list may not describe all possible side effects. Call your doctor for medical advice about side effects. You may report side effects to FDA at 1-800-FDA-1088. Where should I keep   my medicine? This drug is given in a hospital or clinic and will not be stored at home. NOTE: This sheet is a summary. It may not cover all possible information. If you have questions about this medicine, talk to your doctor, pharmacist, or health care provider.  2020 Elsevier/Gold Standard (2011-02-10  17:14:35)  Influenza Virus Vaccine injection What is this medicine? INFLUENZA VIRUS VACCINE (in floo EN zuh VAHY ruhs vak SEEN) helps to reduce the risk of getting influenza also known as the flu. The vaccine only helps protect you against some strains of the flu. This medicine may be used for other purposes; ask your health care provider or pharmacist if you have questions. COMMON BRAND NAME(S): Afluria, Afluria Quadrivalent, Agriflu, Alfuria, FLUAD, Fluarix, Fluarix Quadrivalent, Flublok, Flublok Quadrivalent, FLUCELVAX, FLUCELVAX Quadrivalent, Flulaval, Flulaval Quadrivalent, Fluvirin, Fluzone, Fluzone High-Dose, Fluzone Intradermal, Fluzone Quadrivalent What should I tell my health care provider before I take this medicine? They need to know if you have any of these conditions:  bleeding disorder like hemophilia  fever or infection  Guillain-Barre syndrome or other neurological problems  immune system problems  infection with the human immunodeficiency virus (HIV) or AIDS  low blood platelet counts  multiple sclerosis  an unusual or allergic reaction to influenza virus vaccine, latex, other medicines, foods, dyes, or preservatives. Different brands of vaccines contain different allergens. Some may contain latex or eggs. Talk to your doctor about your allergies to make sure that you get the right vaccine.  pregnant or trying to get pregnant  breast-feeding How should I use this medicine? This vaccine is for injection into a muscle or under the skin. It is given by a health care professional. A copy of Vaccine Information Statements will be given before each vaccination. Read this sheet carefully each time. The sheet may change frequently. Talk to your healthcare provider to see which vaccines are right for you. Some vaccines should not be used in all age groups. Overdosage: If you think you have taken too much of this medicine contact a poison control center or emergency room at  once. NOTE: This medicine is only for you. Do not share this medicine with others. What if I miss a dose? This does not apply. What may interact with this medicine?  chemotherapy or radiation therapy  medicines that lower your immune system like etanercept, anakinra, infliximab, and adalimumab  medicines that treat or prevent blood clots like warfarin  phenytoin  steroid medicines like prednisone or cortisone  theophylline  vaccines This list may not describe all possible interactions. Give your health care provider a list of all the medicines, herbs, non-prescription drugs, or dietary supplements you use. Also tell them if you smoke, drink alcohol, or use illegal drugs. Some items may interact with your medicine. What should I watch for while using this medicine? Report any side effects that do not go away within 3 days to your doctor or health care professional. Call your health care provider if any unusual symptoms occur within 6 weeks of receiving this vaccine. You may still catch the flu, but the illness is not usually as bad. You cannot get the flu from the vaccine. The vaccine will not protect against colds or other illnesses that may cause fever. The vaccine is needed every year. What side effects may I notice from receiving this medicine? Side effects that you should report to your doctor or health care professional as soon as possible:  allergic reactions like skin rash, itching or hives, swelling   of the face, lips, or tongue Side effects that usually do not require medical attention (report to your doctor or health care professional if they continue or are bothersome):  fever  headache  muscle aches and pains  pain, tenderness, redness, or swelling at the injection site  tiredness This list may not describe all possible side effects. Call your doctor for medical advice about side effects. You may report side effects to FDA at 1-800-FDA-1088. Where should I keep my  medicine? The vaccine will be given by a health care professional in a clinic, pharmacy, doctor's office, or other health care setting. You will not be given vaccine doses to store at home. NOTE: This sheet is a summary. It may not cover all possible information. If you have questions about this medicine, talk to your doctor, pharmacist, or health care provider.  2020 Elsevier/Gold Standard (2018-03-27 08:45:43)  

## 2020-03-27 NOTE — Telephone Encounter (Signed)
Scheduled per 11/12 los. Unable tor reach pt. Left voicemail with appt times and dates.

## 2020-04-21 ENCOUNTER — Encounter: Payer: Self-pay | Admitting: Adult Health

## 2020-04-21 ENCOUNTER — Ambulatory Visit (INDEPENDENT_AMBULATORY_CARE_PROVIDER_SITE_OTHER): Payer: Medicare Other | Admitting: Adult Health

## 2020-04-21 ENCOUNTER — Telehealth (HOSPITAL_COMMUNITY): Payer: Self-pay | Admitting: Radiology

## 2020-04-21 VITALS — BP 149/49 | HR 68 | Ht 65.0 in | Wt 181.0 lb

## 2020-04-21 DIAGNOSIS — I639 Cerebral infarction, unspecified: Secondary | ICD-10-CM | POA: Diagnosis not present

## 2020-04-21 DIAGNOSIS — E785 Hyperlipidemia, unspecified: Secondary | ICD-10-CM

## 2020-04-21 DIAGNOSIS — Z794 Long term (current) use of insulin: Secondary | ICD-10-CM

## 2020-04-21 DIAGNOSIS — E1142 Type 2 diabetes mellitus with diabetic polyneuropathy: Secondary | ICD-10-CM

## 2020-04-21 DIAGNOSIS — I671 Cerebral aneurysm, nonruptured: Secondary | ICD-10-CM

## 2020-04-21 DIAGNOSIS — I10 Essential (primary) hypertension: Secondary | ICD-10-CM
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
## 2020-04-21 NOTE — Patient Instructions (Signed)
Continue aspirin 81 mg daily and clopidogrel 75 mg daily for secondary stroke prevention  Continue to follow up with PCP regarding cholesterol and blood pressure management  Maintain strict control of hypertension with blood pressure goal below 130/90, diabetes with hemoglobin A1c goal below 7.0% and cholesterol with LDL cholesterol (bad cholesterol) goal below 70 mg/dL.     Followup in the future with me in 4 months or call earlier if needed     Thank you for coming to see Bethany [name] at [facility]. I hope we have been able to provide you high quality care today.  You may receive a patient satisfaction survey over the next few weeks. We would appreciate your feedback and comments so that we may continue to improve ourselves and the health of our patients.      Cerebral Aneurysm  A cerebral aneurysm is a bulge that occurs in the blood vessel inside the brain. An aneurysm is caused when a weakened part of the blood vessel expands. The blood vessel expands due to the constant pressure from the flow of blood through the weakened blood vessel. As the weakened aneurysm expands, the walls of the aneurysm become weaker. Aneurysms are dangerous because they can leak or burst (rupture). When a cerebral aneurysm ruptures, it causes bleeding in the brain (subarachnoid hemorrhage). The blood flow to the area of the brain supplied by the artery is also reduced. This can cause stroke, seizures, or coma. A ruptured cerebral aneurysm is a medical emergency. This can cause permanent brain damage or death. What are the causes? The exact cause of this condition is not known. What increases the risk? This condition is more likely to develop in people who:  Are older. The condition is most common in people between the ages of 28-60.  Are female  Have a family history of aneurysm in two or more direct relatives.  Have certain conditions that are passed along from parent to child (inherited).  They include: ? Autosomal dominant polycystic kidney disease. This is a condition in which small, fluid-filled sacs (cysts) develop in the kidney. ? Neurofibromatosis type 1. In this condition, flat spots develop under the skin (pigmentation) and tumors grow along nerves in the skin, brain, and other parts of the body. ? Ehlers-Danlos syndrome. This is a condition in which bad connective tissue causes loose or unstable joints and creates a very soft skin that bruises or tears easily.  Smoke.  Have high blood pressure (hypertension).  Abuse alcohol. What are the signs or symptoms? The signs and symptoms of a cerebral aneurysm that has not leaked or ruptured can depend on its size and rate of growth. A small, unchanging aneurysm generally does not cause symptoms. A larger aneurysm that is steadily growing can increase pressure on the brain or nerves.  The increased pressure from a cerebral aneurysm that has not leaked or ruptured can cause:  A headache.  Vision problems.  Numbness or weakness in an arm or leg.  Memory problems.  Problems speaking.  Seizures. If an aneurysm leaks or ruptures, it can cause a life-threatening condition, such as stroke. Symptoms may include:  A sudden, severe headache with no known cause. The headache is often described as the worst headache ever experienced.  Stiff neck.  Nausea or vomiting, especially when combined with other symptoms, such as a headache.  Sudden weakness or numbness of the face, arm, or leg, especially on one side of the body.  Sudden trouble walking or difficulty moving  the arms or legs.  Double vision.  Sudden trouble seeing in one or both eyes.  Trouble speaking or understanding speech (aphasia).  Trouble swallowing.  Dizziness.  Loss of balance or coordination.  Intolerance to light.  Sudden confusion or loss of consciousness. How is this diagnosed? This condition is diagnosed using certain tests,  including:  CT scan.  Computed tomographic angiogram (CTA). This test uses a dye and a scanner to produce images of your blood vessels.  Magnetic resonance angiogram (MRA). This test uses an MRI machine to produce images of your blood vessels.  Digital subtraction angiogram (DSA). This test involves placing a flexible, thin tube (catheter) into the artery in your thigh and guiding it up to the arteries in the brain. A dye is then injected into the area and X-rays are taken to create images of your blood vessels. How is this treated? Unruptured aneurysm Treatment is complex when an aneurysm is found and it is not causing problems. Treatment is individualized, as each case is different. Many factors must be considered, such as the size and exact location of your aneurysm, your age, your overall health, and your preferences. Small aneurysms in certain locations of the brain have a very low chance of bleeding or rupturing. These small aneurysms may not be treated.  In some cases, however, treatment may be required. Treatment depends on the size and location of the aneurysm. They may include:  Coiling. During this procedure, a catheter is inserted and advanced through a blood vessel. Once the catheter reaches the aneurysm, tiny coils are used to block blood flow into the aneurysm. This procedure is sometimes done at the same time as a DSA.  Surgical clipping. During surgery, a clip is placed at the base of the aneurysm. The clip prevents blood from continuing to enter the aneurysm.  Flow diversion. This procedure is used to divert blood flow around the aneurysm with a stent that is placed across the opening of an aneurysm. Ruptured aneurysm Immediate emergency surgery or coiling may be needed to help prevent damage to the brain and to reduce the risk of rebleeding. The timing of treatment is an important factor in preventing complications. Successful early treatment of a ruptured aneurysm within the  first 3 days of a bleed helps to prevent rebleeding and blood vessel spasm. In some cases, there may be a reason to treat 10-14 days after a rupture. Many factors are considered when making this decision, and each case is handled individually. Follow these instructions at home: If your aneurysm is not treated:  Take over-the-counter and prescription medicines only as told by your health care provider.  Follow a diet suggested by your health care provider. Certain dietary changes may be advised to address high blood pressure (hypertension), such as choosing foods that are low in salt (sodium), saturated fat, trans fat, and cholesterol.  Stay physically active. It is recommended that you get at least 30 minutes of activity on most or all days.  Do not use any products that contain nicotine or tobacco, such as cigarettes and e-cigarettes. If you need help quitting, ask your health care provider.  Limit alcohol intake to no more than 1 drink a day for nonpregnant women and 2 drinks a day for men. One drink equals 12 oz of beer, 5 oz of wine, or 1 oz of hard liquor.  Do not use street drugs. If you need help quitting, ask your health care provider.  Keep all follow-up visits as told  by your health care provider. This is important. This includes any referrals, imaging studies, and laboratory tests. Proper follow-up may prevent an aneurysm rupture or a stroke. Get help right away if:  You have a sudden, severe headache with no known cause. This may include a stiff neck.  You have sudden nausea or vomiting with a severe headache.  You have a seizure.  You have other symptoms of stroke. The acronym BEFAST is an easy way to remember the main warning signs of stroke. ? B = Balance problems. Signs include dizziness, sudden trouble walking, or loss of balance. ? E = Eye problems. This includes trouble seeing or a sudden change in vision. ? F = Face changes. This includes sudden weakness or numbness  of the face, or the face or eyelid drooping to one side. ? A = Arm weakness or numbness. This happens suddenly and usually on one side of the body. ? S = Speech problems. This includes trouble speaking or trouble understanding. ? T = Time. Time to call 911 or seek emergency care. Do not wait to see if symptoms will go away. Make note of the time your symptoms started. These symptoms may represent a serious problem that is an emergency. Do not wait to see if the symptoms will go away. Get medical help right away. Call your local emergency services (911 in the [place] Do not drive yourself to the hospital. Summary  An aneurysm is a bulge in an artery.  Aneurysms are dangerous because they can leak or burst (rupture). When a cerebral aneurysm ruptures, it causes bleeding in the brain.  Treatment depends on whether the aneurysm is ruptured. A ruptured aneurysm is a medical emergency.  Get help right away if you have symptoms of stroke. The acronym BEFAST is an easy way to remember the main warning signs of stroke. This information is not intended to replace advice given to you by your health care provider. Make sure you discuss any questions you have with your health care provider. Document Revised: 01/19/2018 Document Reviewed: 06/09/2016 Elsevier Patient Education  [place].

## 2020-04-21 NOTE — Telephone Encounter (Signed)
Returned pt's call, no answer, left VM. JM

## 2020-04-22 ENCOUNTER — Telehealth: Payer: Self-pay | Admitting: Student

## 2020-04-22 NOTE — Telephone Encounter (Signed)
NIR.  Received message from patient's neurologist requesting call to patient regarding questions of her upcoming aneurysm embolization procedure.  Dr. Estanislado Pandy spoke with patient via phone [phone]) at [phone] to discuss. Below are patient's questions:  1- How does procedure occur? Endovascular (via femoral or radial approach) embolization using WEB device versus coils + stent placement- will preform dx cerebral as first step of this procedure to see which devices to use. 2- Medications prior to procedure? Continue DAPT (Plavix and Aspirin) along with pre-medications for contrast allergy (Prednisone and Benadryl)- patient has written directions on how to take these. 3- Labs prior to procedure? P2Y12 on Friday to make sure DAPT effective- will call patient if this result requires medication changes prior to procedure. 4- How long is procedure? Estimated 1-2 hours once prepped on table. 5- Recovery? 1 night stay in neuro ICU with plans for possible discharge next day if stable. Further instructions will be given upon discharge.  All questions answered and concerns addressed. Patient conveys understanding and agrees with above. Please call NIR with questions/concerns.   Bea Graff Arcelia Pals, PA-C 04/22/2020, 10:41 AM

## 2020-04-22 NOTE — Progress Notes (Signed)
I agree with the above plan 

## 2020-04-24 ENCOUNTER — Inpatient Hospital Stay: Payer: Medicare Other | Attending: Hematology

## 2020-04-24 ENCOUNTER — Inpatient Hospital Stay: Payer: Medicare Other

## 2020-04-24 ENCOUNTER — Encounter (HOSPITAL_COMMUNITY): Payer: Self-pay | Admitting: Physician Assistant

## 2020-04-24 ENCOUNTER — Other Ambulatory Visit (HOSPITAL_COMMUNITY): Payer: Self-pay | Admitting: Radiology

## 2020-04-24 ENCOUNTER — Other Ambulatory Visit: Payer: Self-pay

## 2020-04-24 ENCOUNTER — Other Ambulatory Visit (HOSPITAL_COMMUNITY)
Admission: RE | Admit: 2020-04-24 | Discharge: 2020-04-24 | Disposition: A | Discharge status: Home / Self Care | Payer: Medicare Other | Source: Ambulatory Visit | Attending: Interventional Radiology | Admitting: Interventional Radiology

## 2020-04-24 ENCOUNTER — Other Ambulatory Visit: Payer: Self-pay | Admitting: Student

## 2020-04-24 ENCOUNTER — Other Ambulatory Visit: Payer: Medicare Other

## 2020-04-24 ENCOUNTER — Ambulatory Visit: Payer: Medicare Other

## 2020-04-24 VITALS — BP 103/47 | HR 61 | Temp 98.5°F | Resp 18

## 2020-04-24 DIAGNOSIS — Q2733 Arteriovenous malformation of digestive system vessel: Secondary | ICD-10-CM | POA: Diagnosis present

## 2020-04-24 DIAGNOSIS — Z20822 Contact with and (suspected) exposure to covid-19: Secondary | ICD-10-CM | POA: Insufficient documentation

## 2020-04-24 DIAGNOSIS — D649 Anemia, unspecified: Secondary | ICD-10-CM

## 2020-04-24 DIAGNOSIS — K5520 Angiodysplasia of colon without hemorrhage: Secondary | ICD-10-CM

## 2020-04-24 DIAGNOSIS — K922 Gastrointestinal hemorrhage, unspecified: Secondary | ICD-10-CM | POA: Insufficient documentation

## 2020-04-24 DIAGNOSIS — D500 Iron deficiency anemia secondary to blood loss (chronic): Secondary | ICD-10-CM | POA: Diagnosis not present

## 2020-04-24 DIAGNOSIS — I671 Cerebral aneurysm, nonruptured: Secondary | ICD-10-CM

## 2020-04-24 DIAGNOSIS — Z01812 Encounter for preprocedural laboratory examination: Secondary | ICD-10-CM | POA: Insufficient documentation

## 2020-04-24 LAB — CBC WITH DIFFERENTIAL (CANCER CENTER ONLY)
Abs Immature Granulocytes: 0.02 10*3/uL (ref 0.00–0.07)
Basophils Absolute: 0 10*3/uL (ref 0.0–0.1)
Basophils Relative: 0 %
Eosinophils Absolute: 0.2 10*3/uL (ref 0.0–0.5)
Eosinophils Relative: 3 %
HCT: 25 % — ABNORMAL LOW (ref 36.0–46.0)
Hemoglobin: 6.8 g/dL — CL (ref 12.0–15.0)
Immature Granulocytes: 0 %
Lymphocytes Relative: 19 %
Lymphs Abs: 1.3 10*3/uL (ref 0.7–4.0)
MCH: 24.3 pg — ABNORMAL LOW (ref 26.0–34.0)
MCHC: 27.2 g/dL — ABNORMAL LOW (ref 30.0–36.0)
MCV: 89.3 fL (ref 80.0–100.0)
Monocytes Absolute: 0.7 10*3/uL (ref 0.1–1.0)
Monocytes Relative: 10 %
Neutro Abs: 4.9 10*3/uL (ref 1.7–7.7)
Neutrophils Relative %: 68 %
Platelet Count: 277 10*3/uL (ref 150–400)
RBC: 2.8 MIL/uL — ABNORMAL LOW (ref 3.87–5.11)
RDW: 19.4 % — ABNORMAL HIGH (ref 11.5–15.5)
WBC Count: 7.2 10*3/uL (ref 4.0–10.5)
nRBC: 0.8 % — ABNORMAL HIGH (ref 0.0–0.2)

## 2020-04-24 LAB — PREPARE RBC (CROSSMATCH)

## 2020-04-24 LAB — SARS CORONAVIRUS 2 (TAT 6-24 HRS): SARS Coronavirus 2: NEGATIVE

## 2020-04-24 LAB — SAMPLE TO BLOOD BANK

## 2020-04-24 LAB — IRON AND TIBC
Iron: 37 ug/dL — ABNORMAL LOW (ref 41–142)
Saturation Ratios: 11 % — ABNORMAL LOW (ref 21–57)
TIBC: 344 ug/dL (ref 236–444)
UIBC: 307 ug/dL (ref 120–384)

## 2020-04-24 LAB — PLATELET INHIBITION P2Y12: Platelet Function  P2Y12: 152 [PRU] — ABNORMAL LOW (ref 182–335)

## 2020-04-24 LAB — FERRITIN: Ferritin: 169 ng/mL (ref 11–307)

## 2020-04-24 MED ORDER — HEPARIN SOD (PORK) LOCK FLUSH 100 UNIT/ML IV SOLN
500.0000 [IU] | Freq: Once | INTRAVENOUS | Status: AC
  Administered 2020-04-24: 500 [IU]
  Filled 2020-04-24: qty 5

## 2020-04-24 MED ORDER — SODIUM CHLORIDE 0.9% FLUSH
10.0000 mL | Freq: Once | INTRAVENOUS | Status: AC
  Administered 2020-04-24: 10 mL
  Filled 2020-04-24: qty 10

## 2020-04-24 MED ORDER — SODIUM CHLORIDE 0.9% IV SOLUTION
250.0000 mL | Freq: Once | INTRAVENOUS | Status: DC
  Filled 2020-04-24: qty 250

## 2020-04-24 MED ORDER — SODIUM CHLORIDE 0.9 % IV SOLN
400.0000 mg | Freq: Once | INTRAVENOUS | Status: AC
  Administered 2020-04-24: 400 mg via INTRAVENOUS
  Filled 2020-04-24: qty 20

## 2020-04-24 NOTE — Progress Notes (Signed)
Pt discharged in stable condition.

## 2020-04-24 NOTE — Progress Notes (Signed)
I spoke with Bethany [name] as she was going to begin [place] infusion, I asked patient to call pharmacy first and after speaking with pharmacy to return my call. Patient said that she will be able to make calls after she has started infusion. I did not receive a return call. I called Bethany [name]  2 more times in the afternoon and left messaged for her to call me back. I was unable to reach patient by phone.  I left  A message on voice mail.  I instructed the patient to arrive at [place] entrance at -0600-  , register in the [place]. DO NOT eat or drink anything after midnight.   I instructed the patient to take the following medications in the am with just enough water to get them down: Amlodipine, ASA, Plavix, Atorvastatin, Metoprolol, Levothyroxine, Lyrica, Xtampza. Prn Combivent inhaler and bring it to the hospital with her; May take Percocet , Baclofen .  I informed that the anesthesia would like patient to not wear Buthan patch, until after surgery, if possible. I asked patient to shower with antibiotic soap, dry off with a clean towel.  I asked patient to not wear any lotions, powders, cologne, jewelry, piercing, make-up or nail polish.  Wear clean clothes. Brush teeth. I informed patient that there will need to be a driver and someone to stay with him/her for the first 24 hours after surgery.   I instructed  patient to call [phone], in the am if there were any questions or problems.  I  Instructed patient to take 10 units of Toujea on Monday am if CBG is greather than 70. If CBG less that 70 to treat it with 1 tube of glucose gel, 4 glucose tablets, or 4 ounces of aplle or cranberry juice and recheck CBG in 15 mintutes , if CBG does not respond well that she needs to call [phone] for more instructions.

## 2020-04-24 NOTE — Patient Instructions (Signed)

## 2020-04-24 NOTE — Progress Notes (Signed)
Anesthesia Chart Review: Same day workup  Hx of diastolic HF, DM, HTN, CVA, renal artery thrombosis. Recent admission for CVA July 2021, residual expressive aphasia. No source found. No AC due to IDA that requires transfusion. On ASA/plavix for CVA.   Follows with cardiology for HFpEF. Cardiac clearance per telephone encounter 03/05/20, "Chart reviewed as part of pre-operative protocol coverage. Patient was contacted 03/05/2020 in reference to pre-operative risk assessment for pending surgery as outlined below.  Bethany [name] was last seen on 12/12/19 by Dr. Audie Box.  Since that day, Bethany [name] has done well.  She does not have a history of MI. She can complete more than 4.0 METS without angina.  Therefore, based on ACC/AHA guidelines, the patient would be at acceptable risk for the planned procedure without further cardiovascular testing."  Chronic anemia.  Review of recent labs shows baseline hemoglobin to be ~10.0.  Will need DOS labs and eval.   EKG 11/19/19: Sinus bradycardia. Rate 55. Probable anteroseptal infarct, recent. Baseline wander in lead(s) I II aVR. since last tracing no significant change  MRA head 11/20/19: IMPRESSION: No intracranial large vessel occlusion.  There is a high-grade stenosis within a distal M2/proximal M3 left MCA branch vessel in the region of a known acute/early subacute infarct affecting the left temporoparietal lobes.  Apparent moderate/severe stenoses within P3 posterior cerebral artery branches bilaterally.  5-6 MM SUPERIORLY PROJECTING ANEURYSM ARISING FROM THE LEFT MCA BIFURCATION. NEUROSURGICAL/NEUROINTERVENTIONAL CONSULTATION IS RECOMMENDED.  1-2 mm inferiorly projecting vascular protrusion arising from the supraclinoid left ICA, which may reflect a tiny aneurysm or infundibulum.  Right mastoid effusion.  Carotid duplex 11/21/19: Summary:  Right Carotid: Velocities in the right ICA are consistent with a 1-39% stenosis.   Left Carotid:  Velocities in the left ICA are consistent with a 1-39% stenosis.   Vertebrals: Bilateral vertebral arteries demonstrate antegrade flow.   TTE 11/20/19: 1. Left ventricular ejection fraction, by estimation, is 60 to 65%. The  left ventricle has normal function. The left ventricle has no regional  wall motion abnormalities. There is mild left ventricular hypertrophy.  Left ventricular diastolic parameters  are consistent with Grade II diastolic dysfunction (pseudonormalization).  Elevated left ventricular end-diastolic pressure.  2. Right ventricular systolic function is normal. The right ventricular  size is normal.  3. The mitral valve is normal in structure. Trivial mitral valve  regurgitation. No evidence of mitral stenosis.  4. The aortic valve is tricuspid. Aortic valve regurgitation is not  visualized. Mild aortic valve sclerosis is present, with no evidence of  aortic valve stenosis.  5. The inferior vena cava is normal in size with greater than 50%  respiratory variability, suggesting right atrial pressure of 3 mmHg.   Wynonia Musty [facility] Stay [facility]/Anesthesiology Phone [phone] 04/24/2020 10:08 AM

## 2020-04-24 NOTE — Anesthesia Preprocedure Evaluation (Deleted)
Anesthesia Evaluation  Patient identified by MRN, date of birth, ID band Patient awake    Reviewed: Allergy & Precautions, NPO status , Patient's Chart, lab work & pertinent test results  Airway Mallampati: II  TM Distance: >3 FB Neck ROM: Full    Dental  (+) Edentulous Upper, Edentulous Lower   Pulmonary Current Smoker,    breath sounds clear to auscultation       Cardiovascular hypertension,  Rhythm:Regular Rate:Normal     Neuro/Psych    GI/Hepatic   Endo/Other  diabetes  Renal/GU      Musculoskeletal   Abdominal   Peds  Hematology   Anesthesia Other Findings Mild expressive aphasia  Reproductive/Obstetrics                            Anesthesia Physical Anesthesia Plan  ASA: III  Anesthesia Plan: General   Post-op Pain Management:    Induction: Intravenous  PONV Risk Score and Plan: Ondansetron  Airway Management Planned:   Additional Equipment: Arterial line  Intra-op Plan:   Post-operative Plan: Extubation in OR  Informed Consent: I have reviewed the patients History and Physical, chart, labs and discussed the procedure including the risks, benefits and alternatives for the proposed anesthesia with the patient or authorized representative who has indicated his/her understanding and acceptance.       Plan Discussed with: CRNA and Anesthesiologist  Anesthesia Plan Comments: (PAT note by Karoline Caldwell, PA-C: Hx of diastolic HF, DM, HTN, CVA, renal artery thrombosis. Recent admission for CVA July 2021, residual expressive aphasia. No source found. No AC due to IDA that requires transfusion. On ASA/plavix for CVA.   Follows with cardiology for HFpEF. Cardiac clearance per telephone encounter 03/05/20, "Chart reviewed as part of pre-operative protocol coverage. Patient was contacted 03/05/2020 in reference to pre-operative risk assessment for pending surgery as outlined below.   Bethany [name] was last seen on 12/12/19 by Dr. Audie Box.  Since that day, Bethany [name] has done well.  She does not have a history of MI. She can complete more than 4.0 METS without angina.  Therefore, based on ACC/AHA guidelines, the patient would be at acceptable risk for the planned procedure without further cardiovascular testing."  Chronic anemia.  Review of recent labs shows baseline hemoglobin to be ~10.0.  Will need DOS labs and eval.   EKG 11/19/19: Sinus bradycardia. Rate 55. Probable anteroseptal infarct, recent. Baseline wander in lead(s) I II aVR. since last tracing no significant change  MRA head 11/20/19: IMPRESSION: No intracranial large vessel occlusion.  There is a high-grade stenosis within a distal M2/proximal M3 left MCA branch vessel in the region of a known acute/early subacute infarct affecting the left temporoparietal lobes.  Apparent moderate/severe stenoses within P3 posterior cerebral artery branches bilaterally.  5-6 MM SUPERIORLY PROJECTING ANEURYSM ARISING FROM THE LEFT MCA BIFURCATION. NEUROSURGICAL/NEUROINTERVENTIONAL CONSULTATION IS RECOMMENDED.  1-2 mm inferiorly projecting vascular protrusion arising from the supraclinoid left ICA, which may reflect a tiny aneurysm or infundibulum.  Right mastoid effusion.  Carotid duplex 11/21/19: Summary:  Right Carotid: Velocities in the right ICA are consistent with a 1-39% stenosis.   Left Carotid: Velocities in the left ICA are consistent with a 1-39% stenosis.   Vertebrals: Bilateral vertebral arteries demonstrate antegrade flow.   TTE 11/20/19: 1. Left ventricular ejection fraction, by estimation, is 60 to 65%. The  left ventricle has normal function. The left ventricle has no regional  wall motion abnormalities.  There is mild left ventricular hypertrophy.  Left ventricular diastolic parameters  are consistent with Grade II diastolic dysfunction (pseudonormalization).  Elevated left ventricular  end-diastolic pressure.  2. Right ventricular systolic function is normal. The right ventricular  size is normal.  3. The mitral valve is normal in structure. Trivial mitral valve  regurgitation. No evidence of mitral stenosis.  4. The aortic valve is tricuspid. Aortic valve regurgitation is not  visualized. Mild aortic valve sclerosis is present, with no evidence of  aortic valve stenosis.  5. The inferior vena cava is normal in size with greater than 50%  respiratory variability, suggesting right atrial pressure of 3 mmHg.   )       Anesthesia Quick Evaluation

## 2020-04-24 NOTE — Patient Instructions (Signed)

## 2020-04-25 ENCOUNTER — Other Ambulatory Visit: Payer: Self-pay

## 2020-04-25 ENCOUNTER — Inpatient Hospital Stay: Payer: Medicare Other

## 2020-04-25 DIAGNOSIS — D500 Iron deficiency anemia secondary to blood loss (chronic): Secondary | ICD-10-CM | POA: Diagnosis not present

## 2020-04-25 DIAGNOSIS — D649 Anemia, unspecified: Secondary | ICD-10-CM

## 2020-04-25 MED ORDER — SODIUM CHLORIDE 0.9% IV SOLUTION
250.0000 mL | Freq: Once | INTRAVENOUS | Status: AC
  Administered 2020-04-25: 250 mL via INTRAVENOUS
  Filled 2020-04-25: qty 250

## 2020-04-25 MED ORDER — SODIUM CHLORIDE 0.9% FLUSH
10.0000 mL | INTRAVENOUS | Status: AC | PRN
  Administered 2020-04-25: 10 mL
  Filled 2020-04-25: qty 10

## 2020-04-25 MED ORDER — HEPARIN SOD (PORK) LOCK FLUSH 100 UNIT/ML IV SOLN
500.0000 [IU] | Freq: Every day | INTRAVENOUS | Status: AC | PRN
  Administered 2020-04-25: 500 [IU]
  Filled 2020-04-25: qty 5

## 2020-04-27 ENCOUNTER — Encounter (HOSPITAL_COMMUNITY)
Admission: RE | Disposition: A | Discharge status: Home / Self Care | Payer: Self-pay | Source: Home / Self Care | Attending: Interventional Radiology

## 2020-04-27 ENCOUNTER — Telehealth (HOSPITAL_COMMUNITY): Payer: Self-pay | Admitting: Radiology

## 2020-04-27 ENCOUNTER — Encounter (HOSPITAL_COMMUNITY): Payer: Self-pay

## 2020-04-27 ENCOUNTER — Ambulatory Visit (HOSPITAL_COMMUNITY)
Admission: RE | Admit: 2020-04-27 | Discharge: 2020-04-27 | Disposition: A | Discharge status: Home / Self Care | Payer: Medicare Other | Attending: Interventional Radiology | Admitting: Interventional Radiology

## 2020-04-27 ENCOUNTER — Ambulatory Visit (HOSPITAL_COMMUNITY): Admission: RE | Admit: 2020-04-27 | Payer: Medicare Other | Source: Ambulatory Visit

## 2020-04-27 DIAGNOSIS — Z7901 Long term (current) use of anticoagulants: Secondary | ICD-10-CM | POA: Diagnosis not present

## 2020-04-27 DIAGNOSIS — I671 Cerebral aneurysm, nonruptured: Secondary | ICD-10-CM | POA: Diagnosis not present

## 2020-04-27 DIAGNOSIS — Z01818 Encounter for other preprocedural examination: Secondary | ICD-10-CM | POA: Insufficient documentation

## 2020-04-27 DIAGNOSIS — Z538 Procedure and treatment not carried out for other reasons: Secondary | ICD-10-CM | POA: Diagnosis not present

## 2020-04-27 LAB — TYPE AND SCREEN
ABO/RH(D): B POS
Antibody Screen: NEGATIVE
Unit division: 0
Unit division: 0

## 2020-04-27 LAB — CBC
HCT: 33.6 % — ABNORMAL LOW (ref 36.0–46.0)
Hemoglobin: 10.1 g/dL — ABNORMAL LOW (ref 12.0–15.0)
MCH: 27.1 pg (ref 26.0–34.0)
MCHC: 30.1 g/dL (ref 30.0–36.0)
MCV: 90.1 fL (ref 80.0–100.0)
Platelets: 296 10*3/uL (ref 150–400)
RBC: 3.73 MIL/uL — ABNORMAL LOW (ref 3.87–5.11)
RDW: 19.6 % — ABNORMAL HIGH (ref 11.5–15.5)
WBC: 12.1 10*3/uL — ABNORMAL HIGH (ref 4.0–10.5)
nRBC: 2.7 % — ABNORMAL HIGH (ref 0.0–0.2)

## 2020-04-27 LAB — BPAM RBC
Blood Product Expiration Date: 202201042359
Blood Product Expiration Date: 202201052359
ISSUE DATE / TIME: 202112110943
ISSUE DATE / TIME: 202112111202
Unit Type and Rh: 7300
Unit Type and Rh: 7300

## 2020-04-27 LAB — BASIC METABOLIC PANEL
Anion gap: 9 (ref 5–15)
BUN: 24 mg/dL — ABNORMAL HIGH (ref 8–23)
CO2: 24 mmol/L (ref 22–32)
Calcium: 10.1 mg/dL (ref 8.9–10.3)
Chloride: 105 mmol/L (ref 98–111)
Creatinine, Ser: 1.58 mg/dL — ABNORMAL HIGH (ref 0.44–1.00)
GFR, Estimated: 35 mL/min — ABNORMAL LOW (ref >60)
Glucose, Bld: 228 mg/dL — ABNORMAL HIGH (ref 70–99)
Potassium: 4.9 mmol/L (ref 3.5–5.1)
Sodium: 138 mmol/L (ref 135–145)

## 2020-04-27 LAB — PLATELET INHIBITION P2Y12: Platelet Function  P2Y12: 192 [PRU] (ref 182–335)

## 2020-04-27 LAB — PROTIME-INR
INR: 1.1 (ref 0.8–1.2)
Prothrombin Time: 13.3 seconds (ref 11.4–15.2)

## 2020-04-27 LAB — APTT: aPTT: 29 seconds (ref 24–36)

## 2020-04-27 LAB — GLUCOSE, CAPILLARY: Glucose-Capillary: 202 mg/dL — ABNORMAL HIGH (ref 70–99)

## 2020-04-27 SURGERY — IR WITH ANESTHESIA
Anesthesia: General

## 2020-04-27 MED ORDER — SODIUM CHLORIDE 0.9 % IV SOLN: INTRAVENOUS | Status: DC

## 2020-04-27 MED ORDER — LACTATED RINGERS IV SOLN: INTRAVENOUS | Status: DC

## 2020-04-27 MED ORDER — CHLORHEXIDINE GLUCONATE 0.12 % MT SOLN: 15.0000 mL | Freq: Once | OROMUCOSAL | Status: DC

## 2020-04-27 MED ORDER — ORAL CARE MOUTH RINSE: 15.0000 mL | Freq: Once | OROMUCOSAL | Status: DC

## 2020-04-27 NOTE — Progress Notes (Signed)
Per Dr. Anette Guarneri office, patient surgery is cancelled due to a machine bring broke that is needed for surgery. Patient and brother informed and all questions answered.

## 2020-04-27 NOTE — Progress Notes (Signed)
Patient arrived to short stay morning of 04/27/20 at [phone] and nurse and CRNA began to pre-op patient. IR called short stay nurse at [phone] and informed nurse that patient's procedure was cancelled because the equipment was not available.  IR staff stated that patient was notified that procedure was cancelled on Friday, 12/10.  Patient was not cancelled from the OR schedule at that time so she received several phone calls from short stay department with pre-op instructions.    Patient was very upset when told that procedure was cancelled.   Patient did say that she received a call on Friday about procedure being cancelled, but assumed procedure was back on after multiple calls from short stay staff.  Patient given number to office of patient experience.

## 2020-04-27 NOTE — Telephone Encounter (Signed)
Called pt back. Left VM that I am waiting to hear back from our rep on a date. I will call her when I hear back from [place]. JM

## 2020-04-28 ENCOUNTER — Telehealth (HOSPITAL_COMMUNITY): Payer: Self-pay

## 2020-04-28 ENCOUNTER — Telehealth: Payer: Self-pay | Admitting: Hematology

## 2020-04-28 NOTE — Telephone Encounter (Signed)
Patient called at 1515 regarding their change in appointment.  She was very upset that all parties involved were not made aware that she didn't need to arrive on 04/27/20. We are working to have MD proctor scheduled and the clinical rep to be present with the WEB device. She brought some concerns forward about lab values, I committed to her to follow up as soon as I know something from [employer] in [place].   She asked about continuing her plavix 75 a day. Dr. Estanislado Pandy confirmed she needed to continue this regimen.  Covid test the day of the procedure. Patient is aware we will call her as soon as we have confirmation of a date and time for the procedure.

## 2020-04-28 NOTE — Telephone Encounter (Signed)
Scheduled appt per 12/11 sch msg -  Called pt and she is aware of appt date and time

## 2020-04-30 ENCOUNTER — Telehealth (HOSPITAL_COMMUNITY): Payer: Self-pay | Admitting: Radiology

## 2020-04-30 NOTE — Telephone Encounter (Signed)
Pt returned call to [facility]. Pt was updated on her procedure that we are working on for Monday, 12/20 with Deveshwar. Pt was in complete agreement and was not upset during phone call. JM

## 2020-04-30 NOTE — Telephone Encounter (Signed)
Melinda Alewine called pt at 1355 to update her on her upcoming procedure with Deveshwar. She left a detailed message that we are moving forward, have found a proctor, and we will get back with her before then end of the business day tomorrow to confirm that all is in place. JM

## 2020-04-30 NOTE — Telephone Encounter (Signed)
I called patient late Friday afternoon on 04/24/20 to let her know that the case was cancelled. We spoke at length regarding the cancellation. The patient was not happy that her case was being postponed. I assured her that we would be in touch with her ASAP regarding when the case would be rescheduled. The patient was in agreement with that plan of care at the time and seemed ok with this. JM

## 2020-05-01 ENCOUNTER — Encounter (HOSPITAL_COMMUNITY): Payer: Self-pay | Admitting: Interventional Radiology

## 2020-05-01 ENCOUNTER — Other Ambulatory Visit: Payer: Self-pay | Admitting: Student

## 2020-05-01 NOTE — Progress Notes (Signed)
Spoke with pt for pre-op call. Pt denies cardiac history. Pt is a type 2 diabetic. Last A1C was 5.7 on 11/20/19. Pt states her fasting blood sugar is usually between 156-189. Instructed pt to take 1/2 of her regular dose Toujeo insulin (will take 14 units) Sunday PM. Instructed pt not to take the Trulicity Monday AM but to check her blood sugar when she gets up Monday morning. If blood sugar is 70 or below, treat with 1/2 cup of clear juice (apple or cranberry) and recheck blood sugar 15 minutes after drinking juice. Instructed pt to let her nurse know on arrival if she had low blood sugar and that she drank the juice. Pt voiced understanding  Pt states most of her pills are in a package. She does not know which pills are what med. The pills that are not in the package are Atorvastatin, Lyrica, Plavix, Gabapentin, Protonix and Synthroid. Pt will bring the package with her - it has her Metoprolol and Aspirin in it.   Will need Covid test on arrival.

## 2020-05-03 NOTE — Anesthesia Preprocedure Evaluation (Addendum)
Anesthesia Evaluation  Patient identified by MRN, date of birth, ID band Patient awake    Reviewed: Allergy & Precautions, NPO status , Patient's Chart, lab work & pertinent test results, reviewed documented beta blocker date and time   History of Anesthesia Complications Negative for: history of anesthetic complications  Airway Mallampati: II  TM Distance: >3 FB Neck ROM: Full    Dental  (+) Upper Dentures, Lower Dentures   Pulmonary COPD,  COPD inhaler, Current SmokerPatient did not abstain from smoking.,    + rhonchi        Cardiovascular hypertension, Pt. on home beta blockers and Pt. on medications Normal cardiovascular exam   '21 Carotid US - 1-39% b/l ICAS  '21 TTE - EF 60 to 65%. Mild left ventricular hypertrophy.  Grade II diastolic dysfunction (pseudonormalization). Trivial MR. Mild aortic valve sclerosis is present, with no evidence of aortic valve stenosis.     Neuro/Psych  Headaches, PSYCHIATRIC DISORDERS Depression CVA, Residual Symptoms    GI/Hepatic Neg liver ROS, GERD  Medicated and Controlled,  Endo/Other  diabetes, Type 2, Insulin DependentHypothyroidism  Obesity   Renal/GU CRFRenal disease Solitary kidney     Musculoskeletal  (+) Arthritis ,   Abdominal   Peds  Hematology  (+) anemia ,   Anesthesia Other Findings Covid test negative   Reproductive/Obstetrics                          Anesthesia Physical Anesthesia Plan  ASA: III  Anesthesia Plan: General   Post-op Pain Management:    Induction: Intravenous  PONV Risk Score and Plan: 3 and Treatment may vary due to age or medical condition, Ondansetron and Dexamethasone  Airway Management Planned: Oral ETT  Additional Equipment: Arterial line  Intra-op Plan:   Post-operative Plan: Extubation in OR  Informed Consent: I have reviewed the patients History and Physical, chart, labs and discussed the  procedure including the risks, benefits and alternatives for the proposed anesthesia with the patient or authorized representative who has indicated his/her understanding and acceptance.     Dental advisory given  Plan Discussed with: CRNA and Anesthesiologist  Anesthesia Plan Comments:        Anesthesia Quick Evaluation

## 2020-05-04 ENCOUNTER — Inpatient Hospital Stay (HOSPITAL_COMMUNITY)
Admission: AD | Admit: 2020-05-04 | Discharge: 2020-05-11 | DRG: 025 | Disposition: A | Discharge status: Home Health | Payer: Medicare Other | Attending: Interventional Radiology | Admitting: Interventional Radiology

## 2020-05-04 ENCOUNTER — Observation Stay (HOSPITAL_COMMUNITY): Payer: Medicare Other

## 2020-05-04 ENCOUNTER — Ambulatory Visit (HOSPITAL_COMMUNITY): Payer: Medicare Other | Admitting: Certified Registered Nurse Anesthetist

## 2020-05-04 ENCOUNTER — Observation Stay (HOSPITAL_COMMUNITY)
Admission: RE | Admit: 2020-05-04 | Discharge: 2020-05-04 | Disposition: A | Discharge status: Home / Self Care | Payer: Medicare Other | Source: Ambulatory Visit | Attending: Interventional Radiology | Admitting: Interventional Radiology

## 2020-05-04 ENCOUNTER — Encounter (HOSPITAL_COMMUNITY): Payer: Self-pay | Admitting: Interventional Radiology

## 2020-05-04 ENCOUNTER — Encounter (HOSPITAL_COMMUNITY)
Admission: AD | Disposition: A | Discharge status: Home Health | Payer: Self-pay | Source: Home / Self Care | Attending: Interventional Radiology

## 2020-05-04 ENCOUNTER — Other Ambulatory Visit: Payer: Self-pay

## 2020-05-04 ENCOUNTER — Telehealth (HOSPITAL_COMMUNITY): Payer: Self-pay

## 2020-05-04 DIAGNOSIS — F17210 Nicotine dependence, cigarettes, uncomplicated: Secondary | ICD-10-CM | POA: Diagnosis present

## 2020-05-04 DIAGNOSIS — R300 Dysuria: Secondary | ICD-10-CM | POA: Diagnosis not present

## 2020-05-04 DIAGNOSIS — E039 Hypothyroidism, unspecified: Secondary | ICD-10-CM | POA: Diagnosis present

## 2020-05-04 DIAGNOSIS — Z794 Long term (current) use of insulin: Secondary | ICD-10-CM

## 2020-05-04 DIAGNOSIS — E1122 Type 2 diabetes mellitus with diabetic chronic kidney disease: Secondary | ICD-10-CM | POA: Diagnosis present

## 2020-05-04 DIAGNOSIS — E669 Obesity, unspecified: Secondary | ICD-10-CM | POA: Diagnosis present

## 2020-05-04 DIAGNOSIS — I671 Cerebral aneurysm, nonruptured: Secondary | ICD-10-CM

## 2020-05-04 DIAGNOSIS — N1831 Chronic kidney disease, stage 3a: Secondary | ICD-10-CM | POA: Diagnosis present

## 2020-05-04 DIAGNOSIS — R29708 NIHSS score 8: Secondary | ICD-10-CM | POA: Diagnosis not present

## 2020-05-04 DIAGNOSIS — I97821 Postprocedural cerebrovascular infarction during other surgery: Secondary | ICD-10-CM | POA: Diagnosis not present

## 2020-05-04 DIAGNOSIS — M436 Torticollis: Secondary | ICD-10-CM | POA: Diagnosis present

## 2020-05-04 DIAGNOSIS — Z8701 Personal history of pneumonia (recurrent): Secondary | ICD-10-CM

## 2020-05-04 DIAGNOSIS — R488 Other symbolic dysfunctions: Secondary | ICD-10-CM | POA: Diagnosis not present

## 2020-05-04 DIAGNOSIS — I63512 Cerebral infarction due to unspecified occlusion or stenosis of left middle cerebral artery: Secondary | ICD-10-CM | POA: Diagnosis not present

## 2020-05-04 DIAGNOSIS — G9349 Other encephalopathy: Secondary | ICD-10-CM | POA: Diagnosis not present

## 2020-05-04 DIAGNOSIS — Z833 Family history of diabetes mellitus: Secondary | ICD-10-CM

## 2020-05-04 DIAGNOSIS — K5900 Constipation, unspecified: Secondary | ICD-10-CM | POA: Diagnosis not present

## 2020-05-04 DIAGNOSIS — Z8249 Family history of ischemic heart disease and other diseases of the circulatory system: Secondary | ICD-10-CM

## 2020-05-04 DIAGNOSIS — Z905 Acquired absence of kidney: Secondary | ICD-10-CM

## 2020-05-04 DIAGNOSIS — D500 Iron deficiency anemia secondary to blood loss (chronic): Secondary | ICD-10-CM | POA: Diagnosis present

## 2020-05-04 DIAGNOSIS — R471 Dysarthria and anarthria: Secondary | ICD-10-CM | POA: Diagnosis not present

## 2020-05-04 DIAGNOSIS — Z90710 Acquired absence of both cervix and uterus: Secondary | ICD-10-CM

## 2020-05-04 DIAGNOSIS — G8191 Hemiplegia, unspecified affecting right dominant side: Secondary | ICD-10-CM | POA: Diagnosis not present

## 2020-05-04 DIAGNOSIS — Z7902 Long term (current) use of antithrombotics/antiplatelets: Secondary | ICD-10-CM

## 2020-05-04 DIAGNOSIS — Z7982 Long term (current) use of aspirin: Secondary | ICD-10-CM

## 2020-05-04 DIAGNOSIS — R4701 Aphasia: Secondary | ICD-10-CM | POA: Diagnosis not present

## 2020-05-04 DIAGNOSIS — Z972 Presence of dental prosthetic device (complete) (partial): Secondary | ICD-10-CM

## 2020-05-04 DIAGNOSIS — R278 Other lack of coordination: Secondary | ICD-10-CM | POA: Diagnosis not present

## 2020-05-04 DIAGNOSIS — I6300 Cerebral infarction due to thrombosis of unspecified precerebral artery: Secondary | ICD-10-CM

## 2020-05-04 DIAGNOSIS — G8929 Other chronic pain: Secondary | ICD-10-CM

## 2020-05-04 DIAGNOSIS — M1990 Unspecified osteoarthritis, unspecified site: Secondary | ICD-10-CM | POA: Diagnosis present

## 2020-05-04 DIAGNOSIS — Z20822 Contact with and (suspected) exposure to covid-19: Secondary | ICD-10-CM | POA: Diagnosis present

## 2020-05-04 DIAGNOSIS — Z91041 Radiographic dye allergy status: Secondary | ICD-10-CM

## 2020-05-04 DIAGNOSIS — G894 Chronic pain syndrome: Secondary | ICD-10-CM | POA: Diagnosis present

## 2020-05-04 DIAGNOSIS — K219 Gastro-esophageal reflux disease without esophagitis: Secondary | ICD-10-CM | POA: Diagnosis present

## 2020-05-04 DIAGNOSIS — J449 Chronic obstructive pulmonary disease, unspecified: Secondary | ICD-10-CM | POA: Diagnosis present

## 2020-05-04 DIAGNOSIS — Z79899 Other long term (current) drug therapy: Secondary | ICD-10-CM

## 2020-05-04 DIAGNOSIS — Z6830 Body mass index (BMI) 30.0-30.9, adult: Secondary | ICD-10-CM

## 2020-05-04 DIAGNOSIS — Z9181 History of falling: Secondary | ICD-10-CM

## 2020-05-04 DIAGNOSIS — F1120 Opioid dependence, uncomplicated: Secondary | ICD-10-CM | POA: Diagnosis present

## 2020-05-04 DIAGNOSIS — I130 Hypertensive heart and chronic kidney disease with heart failure and stage 1 through stage 4 chronic kidney disease, or unspecified chronic kidney disease: Secondary | ICD-10-CM | POA: Diagnosis present

## 2020-05-04 DIAGNOSIS — E785 Hyperlipidemia, unspecified: Secondary | ICD-10-CM | POA: Diagnosis present

## 2020-05-04 DIAGNOSIS — I5032 Chronic diastolic (congestive) heart failure: Secondary | ICD-10-CM | POA: Diagnosis present

## 2020-05-04 DIAGNOSIS — R251 Tremor, unspecified: Secondary | ICD-10-CM | POA: Diagnosis not present

## 2020-05-04 DIAGNOSIS — Z79890 Hormone replacement therapy (postmenopausal): Secondary | ICD-10-CM

## 2020-05-04 HISTORY — DX: Anemia, unspecified: D64.9

## 2020-05-04 HISTORY — DX: Cerebral infarction, unspecified: I63.9

## 2020-05-04 HISTORY — DX: Chronic obstructive pulmonary disease, unspecified: J44.9

## 2020-05-04 HISTORY — DX: Gastro-esophageal reflux disease without esophagitis: K21.9

## 2020-05-04 HISTORY — DX: Pneumonia, unspecified organism: J18.9

## 2020-05-04 LAB — COMPREHENSIVE METABOLIC PANEL
ALT: 65 U/L — ABNORMAL HIGH (ref 0–44)
AST: 31 U/L (ref 15–41)
Albumin: 3 g/dL — ABNORMAL LOW (ref 3.5–5.0)
Alkaline Phosphatase: 192 U/L — ABNORMAL HIGH (ref 38–126)
Anion gap: 8 (ref 5–15)
BUN: 27 mg/dL — ABNORMAL HIGH (ref 8–23)
CO2: 21 mmol/L — ABNORMAL LOW (ref 22–32)
Calcium: 8.7 mg/dL — ABNORMAL LOW (ref 8.9–10.3)
Chloride: 108 mmol/L (ref 98–111)
Creatinine, Ser: 1.26 mg/dL — ABNORMAL HIGH (ref 0.44–1.00)
GFR, Estimated: 46 mL/min — ABNORMAL LOW (ref >60)
Glucose, Bld: 242 mg/dL — ABNORMAL HIGH (ref 70–99)
Potassium: 5 mmol/L (ref 3.5–5.1)
Sodium: 137 mmol/L (ref 135–145)
Total Bilirubin: 0.6 mg/dL (ref 0.3–1.2)
Total Protein: 5.3 g/dL — ABNORMAL LOW (ref 6.5–8.1)

## 2020-05-04 LAB — CBC WITH DIFFERENTIAL/PLATELET
Abs Immature Granulocytes: 0.08 10*3/uL — ABNORMAL HIGH (ref 0.00–0.07)
Basophils Absolute: 0 10*3/uL (ref 0.0–0.1)
Basophils Relative: 0 %
Eosinophils Absolute: 0 10*3/uL (ref 0.0–0.5)
Eosinophils Relative: 0 %
HCT: 29.7 % — ABNORMAL LOW (ref 36.0–46.0)
Hemoglobin: 8.8 g/dL — ABNORMAL LOW (ref 12.0–15.0)
Immature Granulocytes: 1 %
Lymphocytes Relative: 4 %
Lymphs Abs: 0.5 10*3/uL — ABNORMAL LOW (ref 0.7–4.0)
MCH: 25.9 pg — ABNORMAL LOW (ref 26.0–34.0)
MCHC: 29.6 g/dL — ABNORMAL LOW (ref 30.0–36.0)
MCV: 87.4 fL (ref 80.0–100.0)
Monocytes Absolute: 0.3 10*3/uL (ref 0.1–1.0)
Monocytes Relative: 2 %
Neutro Abs: 13.1 10*3/uL — ABNORMAL HIGH (ref 1.7–7.7)
Neutrophils Relative %: 93 %
Platelets: 229 10*3/uL (ref 150–400)
RBC: 3.4 MIL/uL — ABNORMAL LOW (ref 3.87–5.11)
RDW: 17.8 % — ABNORMAL HIGH (ref 11.5–15.5)
WBC: 14 10*3/uL — ABNORMAL HIGH (ref 4.0–10.5)
nRBC: 0 % (ref 0.0–0.2)

## 2020-05-04 LAB — URINALYSIS, ROUTINE W REFLEX MICROSCOPIC
Bacteria, UA: NONE SEEN
Bilirubin Urine: NEGATIVE
Glucose, UA: NEGATIVE mg/dL
Hgb urine dipstick: NEGATIVE
Ketones, ur: NEGATIVE mg/dL
Leukocytes,Ua: NEGATIVE
Nitrite: POSITIVE — AB
Protein, ur: NEGATIVE mg/dL
Specific Gravity, Urine: 1.032 — ABNORMAL HIGH (ref 1.005–1.030)
pH: 5 (ref 5.0–8.0)

## 2020-05-04 LAB — POCT ACTIVATED CLOTTING TIME
Activated Clotting Time: 214 seconds
Activated Clotting Time: 238 seconds
Activated Clotting Time: 255 seconds

## 2020-05-04 LAB — PREPARE RBC (CROSSMATCH)

## 2020-05-04 LAB — MRSA PCR SCREENING: MRSA by PCR: NEGATIVE

## 2020-05-04 LAB — GLUCOSE, CAPILLARY
Glucose-Capillary: 260 mg/dL — ABNORMAL HIGH (ref 70–99)
Glucose-Capillary: 183 mg/dL — ABNORMAL HIGH (ref 70–99)
Glucose-Capillary: 236 mg/dL — ABNORMAL HIGH (ref 70–99)

## 2020-05-04 LAB — HEMOGLOBIN A1C
Hgb A1c MFr Bld: 4.7 % — ABNORMAL LOW (ref 4.8–5.6)
Mean Plasma Glucose: 88.19 mg/dL

## 2020-05-04 LAB — SARS CORONAVIRUS 2 BY RT PCR (HOSPITAL ORDER, PERFORMED IN ~~LOC~~ HOSPITAL LAB): SARS Coronavirus 2: NEGATIVE

## 2020-05-04 LAB — PLATELET INHIBITION P2Y12: Platelet Function  P2Y12: 102 [PRU] — ABNORMAL LOW (ref 182–335)

## 2020-05-04 SURGERY — IR WITH ANESTHESIA
Anesthesia: General

## 2020-05-04 MED ORDER — LACTATED RINGERS IV SOLN: INTRAVENOUS | Status: DC

## 2020-05-04 MED ORDER — HEPARIN (PORCINE) 25000 UT/250ML-% IV SOLN
600.0000 [IU]/h | INTRAVENOUS | Status: DC
  Administered 2020-05-04: 600 [IU]/h via INTRAVENOUS

## 2020-05-04 MED ORDER — INSULIN ASPART 100 UNIT/ML ~~LOC~~ SOLN
0.0000 [IU] | SUBCUTANEOUS | Status: DC
  Administered 2020-05-04 (×2): 5 [IU] via SUBCUTANEOUS
  Administered 2020-05-05: 2 [IU] via SUBCUTANEOUS
  Administered 2020-05-05: 3 [IU] via SUBCUTANEOUS
  Administered 2020-05-05: 5 [IU] via SUBCUTANEOUS
  Administered 2020-05-05: 3 [IU] via SUBCUTANEOUS
  Administered 2020-05-06 (×2): 5 [IU] via SUBCUTANEOUS
  Administered 2020-05-06 (×2): 3 [IU] via SUBCUTANEOUS
  Administered 2020-05-06: 2 [IU] via SUBCUTANEOUS
  Administered 2020-05-06: 3 [IU] via SUBCUTANEOUS
  Administered 2020-05-07 (×2): 5 [IU] via SUBCUTANEOUS
  Administered 2020-05-07: 3 [IU] via SUBCUTANEOUS
  Administered 2020-05-07: 5 [IU] via SUBCUTANEOUS
  Administered 2020-05-07: 3 [IU] via SUBCUTANEOUS
  Administered 2020-05-07: 8 [IU] via SUBCUTANEOUS
  Administered 2020-05-08: 3 [IU] via SUBCUTANEOUS
  Administered 2020-05-08: 8 [IU] via SUBCUTANEOUS
  Administered 2020-05-08: 5 [IU] via SUBCUTANEOUS
  Administered 2020-05-08: 3 [IU] via SUBCUTANEOUS
  Administered 2020-05-08: 5 [IU] via SUBCUTANEOUS
  Administered 2020-05-08 – 2020-05-09 (×2): 3 [IU] via SUBCUTANEOUS
  Administered 2020-05-09 (×2): 5 [IU] via SUBCUTANEOUS
  Administered 2020-05-09: 8 [IU] via SUBCUTANEOUS
  Administered 2020-05-09: 5 [IU] via SUBCUTANEOUS
  Administered 2020-05-10: 8 [IU] via SUBCUTANEOUS
  Administered 2020-05-10: 15 [IU] via SUBCUTANEOUS
  Administered 2020-05-10: 3 [IU] via SUBCUTANEOUS
  Administered 2020-05-10: 8 [IU] via SUBCUTANEOUS
  Administered 2020-05-10: 11 [IU] via SUBCUTANEOUS
  Administered 2020-05-10: 3 [IU] via SUBCUTANEOUS
  Administered 2020-05-10: 5 [IU] via SUBCUTANEOUS
  Administered 2020-05-11: 8 [IU] via SUBCUTANEOUS
  Administered 2020-05-11: 15 [IU] via SUBCUTANEOUS
  Administered 2020-05-11: 5 [IU] via SUBCUTANEOUS

## 2020-05-04 MED ORDER — ASPIRIN EC 81 MG PO TBEC
81.0000 mg | DELAYED_RELEASE_TABLET | Freq: Every day | ORAL | Status: DC
  Administered 2020-05-05 – 2020-05-11 (×7): 81 mg via ORAL
  Filled 2020-05-04 (×7): qty 1

## 2020-05-04 MED ORDER — HEPARIN SODIUM (PORCINE) 1000 UNIT/ML IJ SOLN
INTRAMUSCULAR | Status: DC | PRN
  Administered 2020-05-04: 3000 [IU] via INTRAVENOUS

## 2020-05-04 MED ORDER — ESMOLOL HCL 100 MG/10ML IV SOLN
INTRAVENOUS | Status: DC | PRN
  Administered 2020-05-04: 20 mg via INTRAVENOUS

## 2020-05-04 MED ORDER — HEPARIN (PORCINE) 25000 UT/250ML-% IV SOLN
INTRAVENOUS | Status: AC
  Filled 2020-05-04: qty 250

## 2020-05-04 MED ORDER — NIMODIPINE 30 MG PO CAPS: 0.0000 mg | ORAL_CAPSULE | ORAL | Status: DC

## 2020-05-04 MED ORDER — VERAPAMIL HCL 2.5 MG/ML IV SOLN
INTRAVENOUS | Status: AC
  Filled 2020-05-04: qty 2

## 2020-05-04 MED ORDER — ORAL CARE MOUTH RINSE: 15.0000 mL | Freq: Once | OROMUCOSAL | Status: AC

## 2020-05-04 MED ORDER — LIDOCAINE 2% (20 MG/ML) 5 ML SYRINGE
INTRAMUSCULAR | Status: DC | PRN
  Administered 2020-05-04: 60 mg via INTRAVENOUS

## 2020-05-04 MED ORDER — HEPARIN SODIUM (PORCINE) 1000 UNIT/ML IJ SOLN
INTRAMUSCULAR | Status: AC
  Filled 2020-05-04: qty 1

## 2020-05-04 MED ORDER — SODIUM CHLORIDE 0.9 % IV SOLN: INTRAVENOUS | Status: DC

## 2020-05-04 MED ORDER — FENTANYL CITRATE (PF) 100 MCG/2ML IJ SOLN: 25.0000 ug | INTRAMUSCULAR | Status: DC | PRN

## 2020-05-04 MED ORDER — FUROSEMIDE 40 MG PO TABS: 40.0000 mg | ORAL_TABLET | Freq: Every day | ORAL | Status: DC | PRN

## 2020-05-04 MED ORDER — LOSARTAN POTASSIUM 25 MG PO TABS
25.0000 mg | ORAL_TABLET | Freq: Every day | ORAL | Status: DC
Start: 2020-05-05 — End: 2020-05-11
  Administered 2020-05-05 – 2020-05-11 (×7): 25 mg via ORAL
  Filled 2020-05-04 (×7): qty 1

## 2020-05-04 MED ORDER — SUGAMMADEX SODIUM 200 MG/2ML IV SOLN
INTRAVENOUS | Status: DC | PRN
  Administered 2020-05-04: 50 mg via INTRAVENOUS
  Administered 2020-05-04: 200 mg via INTRAVENOUS

## 2020-05-04 MED ORDER — CLOPIDOGREL BISULFATE 75 MG PO TABS
75.0000 mg | ORAL_TABLET | Freq: Every day | ORAL | Status: DC
  Administered 2020-05-05 – 2020-05-11 (×7): 75 mg via ORAL
  Filled 2020-05-04 (×7): qty 1

## 2020-05-04 MED ORDER — ONDANSETRON HCL 4 MG/2ML IJ SOLN: 4.0000 mg | Freq: Once | INTRAMUSCULAR | Status: DC | PRN

## 2020-05-04 MED ORDER — CHLORHEXIDINE GLUCONATE 0.12 % MT SOLN: 15.0000 mL | Freq: Once | OROMUCOSAL | Status: AC

## 2020-05-04 MED ORDER — METOPROLOL TARTRATE 50 MG PO TABS
50.0000 mg | ORAL_TABLET | Freq: Two times a day (BID) | ORAL | Status: DC
  Administered 2020-05-05 – 2020-05-11 (×11): 50 mg via ORAL
  Filled 2020-05-04 (×12): qty 1

## 2020-05-04 MED ORDER — NITROGLYCERIN 1 MG/10 ML FOR IR/CATH LAB
INTRA_ARTERIAL | Status: AC
  Filled 2020-05-04: qty 10

## 2020-05-04 MED ORDER — ROCURONIUM BROMIDE 10 MG/ML (PF) SYRINGE
PREFILLED_SYRINGE | INTRAVENOUS | Status: DC | PRN
  Administered 2020-05-04: 20 mg via INTRAVENOUS
  Administered 2020-05-04: 50 mg via INTRAVENOUS
  Administered 2020-05-04: 20 mg via INTRAVENOUS

## 2020-05-04 MED ORDER — ALBUTEROL SULFATE (2.5 MG/3ML) 0.083% IN NEBU: 2.5000 mg | INHALATION_SOLUTION | Freq: Once | RESPIRATORY_TRACT | Status: AC

## 2020-05-04 MED ORDER — BACLOFEN 10 MG PO TABS
10.0000 mg | ORAL_TABLET | Freq: Three times a day (TID) | ORAL | Status: DC | PRN
  Administered 2020-05-06 – 2020-05-08 (×4): 10 mg via ORAL
  Filled 2020-05-04 (×5): qty 1

## 2020-05-04 MED ORDER — AMLODIPINE BESYLATE 10 MG PO TABS
10.0000 mg | ORAL_TABLET | Freq: Every day | ORAL | Status: DC
  Administered 2020-05-05 – 2020-05-11 (×7): 10 mg via ORAL
  Filled 2020-05-04 (×7): qty 1

## 2020-05-04 MED ORDER — CHLORHEXIDINE GLUCONATE 0.12 % MT SOLN
OROMUCOSAL | Status: AC
  Administered 2020-05-04: 15 mL via OROMUCOSAL
  Filled 2020-05-04: qty 15

## 2020-05-04 MED ORDER — ONDANSETRON HCL 4 MG/2ML IJ SOLN
INTRAMUSCULAR | Status: DC | PRN
  Administered 2020-05-04: 4 mg via INTRAVENOUS

## 2020-05-04 MED ORDER — PANTOPRAZOLE SODIUM 40 MG PO TBEC
40.0000 mg | DELAYED_RELEASE_TABLET | Freq: Every day | ORAL | Status: DC
  Administered 2020-05-05 – 2020-05-11 (×7): 40 mg via ORAL
  Filled 2020-05-04 (×9): qty 1

## 2020-05-04 MED ORDER — CHLORHEXIDINE GLUCONATE CLOTH 2 % EX PADS
6.0000 | MEDICATED_PAD | Freq: Every day | CUTANEOUS | Status: DC
  Administered 2020-05-04 – 2020-05-11 (×5): 6 via TOPICAL

## 2020-05-04 MED ORDER — OXYCODONE HCL ER 15 MG PO T12A
30.0000 mg | EXTENDED_RELEASE_TABLET | Freq: Two times a day (BID) | ORAL | Status: DC
Start: 2020-05-04 — End: 2020-05-11
  Administered 2020-05-04 – 2020-05-11 (×14): 30 mg via ORAL
  Filled 2020-05-04 (×14): qty 2

## 2020-05-04 MED ORDER — ASPIRIN EC 325 MG PO TBEC
325.0000 mg | DELAYED_RELEASE_TABLET | ORAL | Status: AC
  Administered 2020-05-04: 325 mg via ORAL
  Filled 2020-05-04: qty 1

## 2020-05-04 MED ORDER — ALBUTEROL SULFATE (2.5 MG/3ML) 0.083% IN NEBU
INHALATION_SOLUTION | RESPIRATORY_TRACT | Status: AC
  Administered 2020-05-04: 2.5 mg via RESPIRATORY_TRACT
  Filled 2020-05-04: qty 3

## 2020-05-04 MED ORDER — EZETIMIBE 10 MG PO TABS
10.0000 mg | ORAL_TABLET | Freq: Every day | ORAL | Status: DC
  Administered 2020-05-05 – 2020-05-11 (×7): 10 mg via ORAL
  Filled 2020-05-04 (×8): qty 1

## 2020-05-04 MED ORDER — LEVOTHYROXINE SODIUM 75 MCG PO TABS
75.0000 ug | ORAL_TABLET | Freq: Every day | ORAL | Status: DC
  Administered 2020-05-05 – 2020-05-11 (×7): 75 ug via ORAL
  Filled 2020-05-04 (×7): qty 1

## 2020-05-04 MED ORDER — LIDOCAINE HCL 1 % IJ SOLN
INTRAMUSCULAR | Status: DC | PRN
  Administered 2020-05-04: 5 mL

## 2020-05-04 MED ORDER — OXYCODONE HCL 5 MG PO TABS
5.0000 mg | ORAL_TABLET | Freq: Once | ORAL | Status: DC | PRN
Start: 2020-05-04 — End: 2020-05-04

## 2020-05-04 MED ORDER — CLEVIDIPINE BUTYRATE 0.5 MG/ML IV EMUL
INTRAVENOUS | Status: AC
  Administered 2020-05-04: 15 mg/h via INTRAVENOUS
  Filled 2020-05-04: qty 50

## 2020-05-04 MED ORDER — DIPHENHYDRAMINE HCL 25 MG PO CAPS: 50.0000 mg | ORAL_CAPSULE | Freq: Four times a day (QID) | ORAL | Status: DC | PRN

## 2020-05-04 MED ORDER — ALLOPURINOL 100 MG PO TABS
100.0000 mg | ORAL_TABLET | Freq: Every day | ORAL | Status: DC
  Administered 2020-05-04 – 2020-05-10 (×7): 100 mg via ORAL
  Filled 2020-05-04 (×7): qty 1

## 2020-05-04 MED ORDER — METOPROLOL TARTRATE 50 MG PO TABS: 50.0000 mg | ORAL_TABLET | Freq: Once | ORAL | Status: AC

## 2020-05-04 MED ORDER — LIDOCAINE HCL 1 % IJ SOLN
INTRAMUSCULAR | Status: AC
  Filled 2020-05-04: qty 20

## 2020-05-04 MED ORDER — ACETAMINOPHEN 325 MG PO TABS
650.0000 mg | ORAL_TABLET | ORAL | Status: DC | PRN
  Administered 2020-05-06 – 2020-05-11 (×6): 650 mg via ORAL
  Filled 2020-05-04 (×6): qty 2

## 2020-05-04 MED ORDER — SENNOSIDES-DOCUSATE SODIUM 8.6-50 MG PO TABS
1.0000 | ORAL_TABLET | Freq: Every day | ORAL | Status: DC
  Administered 2020-05-04 – 2020-05-10 (×6): 1 via ORAL
  Filled 2020-05-04 (×6): qty 1

## 2020-05-04 MED ORDER — IOHEXOL 300 MG/ML  SOLN
100.0000 mL | Freq: Once | INTRAMUSCULAR | Status: AC | PRN
  Administered 2020-05-04: 25 mL

## 2020-05-04 MED ORDER — CEFAZOLIN SODIUM-DEXTROSE 2-4 GM/100ML-% IV SOLN
2.0000 g | INTRAVENOUS | Status: AC
  Administered 2020-05-04: 2 g via INTRAVENOUS
  Filled 2020-05-04: qty 100

## 2020-05-04 MED ORDER — ALBUTEROL SULFATE HFA 108 (90 BASE) MCG/ACT IN AERS
INHALATION_SPRAY | RESPIRATORY_TRACT | Status: DC | PRN
Start: 2020-05-04 — End: 2020-05-04
  Administered 2020-05-04: 4 via RESPIRATORY_TRACT

## 2020-05-04 MED ORDER — OXYCODONE HCL 5 MG/5ML PO SOLN
5.0000 mg | Freq: Once | ORAL | Status: DC | PRN
Start: 2020-05-04 — End: 2020-05-04

## 2020-05-04 MED ORDER — IOHEXOL 300 MG/ML  SOLN
150.0000 mL | Freq: Once | INTRAMUSCULAR | Status: AC | PRN
  Administered 2020-05-04: 75 mL

## 2020-05-04 MED ORDER — PROPOFOL 10 MG/ML IV BOLUS
INTRAVENOUS | Status: DC | PRN
  Administered 2020-05-04: 20 mg via INTRAVENOUS
  Administered 2020-05-04: 150 mg via INTRAVENOUS

## 2020-05-04 MED ORDER — ACETAMINOPHEN 650 MG RE SUPP: 650.0000 mg | RECTAL | Status: DC | PRN

## 2020-05-04 MED ORDER — METOPROLOL TARTRATE 50 MG PO TABS
ORAL_TABLET | ORAL | Status: AC
  Administered 2020-05-04: 50 mg via ORAL
  Filled 2020-05-04: qty 1

## 2020-05-04 MED ORDER — PREGABALIN 25 MG PO CAPS
25.0000 mg | ORAL_CAPSULE | Freq: Two times a day (BID) | ORAL | Status: DC
  Administered 2020-05-04 – 2020-05-11 (×14): 25 mg via ORAL
  Filled 2020-05-04 (×14): qty 1

## 2020-05-04 MED ORDER — IOHEXOL 300 MG/ML  SOLN
50.0000 mL | Freq: Once | INTRAMUSCULAR | Status: AC | PRN
  Administered 2020-05-04: 30 mL

## 2020-05-04 MED ORDER — CLOPIDOGREL BISULFATE 75 MG PO TABS
75.0000 mg | ORAL_TABLET | ORAL | Status: AC
  Administered 2020-05-04: 75 mg via ORAL
  Filled 2020-05-04: qty 1

## 2020-05-04 MED ORDER — PROTAMINE SULFATE 10 MG/ML IV SOLN
INTRAVENOUS | Status: DC | PRN
  Administered 2020-05-04: 5 mg via INTRAVENOUS

## 2020-05-04 MED ORDER — ONDANSETRON HCL 4 MG/2ML IJ SOLN: 4.0000 mg | Freq: Four times a day (QID) | INTRAMUSCULAR | Status: DC | PRN

## 2020-05-04 MED ORDER — FENTANYL CITRATE (PF) 250 MCG/5ML IJ SOLN
INTRAMUSCULAR | Status: DC | PRN
  Administered 2020-05-04 (×2): 50 ug via INTRAVENOUS

## 2020-05-04 MED ORDER — CLEVIDIPINE BUTYRATE 0.5 MG/ML IV EMUL
INTRAVENOUS | Status: DC | PRN
Start: 2020-05-04 — End: 2020-05-04
  Administered 2020-05-04: 1 mg/h via INTRAVENOUS

## 2020-05-04 MED ORDER — CLEVIDIPINE BUTYRATE 0.5 MG/ML IV EMUL
0.0000 mg/h | INTRAVENOUS | Status: DC
  Administered 2020-05-04: 15 mg/h via INTRAVENOUS
  Administered 2020-05-04: 14 mg/h via INTRAVENOUS
  Administered 2020-05-04: 8 mg/h via INTRAVENOUS
  Administered 2020-05-05: 16 mg/h via INTRAVENOUS
  Filled 2020-05-04 (×3): qty 50

## 2020-05-04 MED ORDER — ATORVASTATIN CALCIUM 80 MG PO TABS
80.0000 mg | ORAL_TABLET | Freq: Every day | ORAL | Status: DC
  Administered 2020-05-05 – 2020-05-11 (×7): 80 mg via ORAL
  Filled 2020-05-04 (×7): qty 1

## 2020-05-04 MED ORDER — IOHEXOL 350 MG/ML SOLN
100.0000 mL | Freq: Once | INTRAVENOUS | Status: AC | PRN
  Administered 2020-05-04: 75 mL via INTRAVENOUS

## 2020-05-04 MED ORDER — ACETAMINOPHEN 160 MG/5ML PO SOLN: 650.0000 mg | ORAL | Status: DC | PRN

## 2020-05-04 MED ORDER — IPRATROPIUM-ALBUTEROL 20-100 MCG/ACT IN AERS
1.0000 | INHALATION_SPRAY | Freq: Four times a day (QID) | RESPIRATORY_TRACT | Status: DC | PRN
  Filled 2020-05-04: qty 4

## 2020-05-04 MED ORDER — PHENYLEPHRINE HCL-NACL 10-0.9 MG/250ML-% IV SOLN
INTRAVENOUS | Status: DC | PRN
  Administered 2020-05-04: 25 ug/min via INTRAVENOUS

## 2020-05-04 MED ORDER — GABAPENTIN 600 MG PO TABS
600.0000 mg | ORAL_TABLET | Freq: Four times a day (QID) | ORAL | Status: DC
  Administered 2020-05-04 – 2020-05-06 (×6): 600 mg via ORAL
  Filled 2020-05-04 (×9): qty 1

## 2020-05-04 MED ORDER — SODIUM CHLORIDE 0.9% IV SOLUTION: Freq: Once | INTRAVENOUS | Status: DC

## 2020-05-04 MED ORDER — OXYCODONE ER 27 MG PO C12A: 27.0000 mg | EXTENDED_RELEASE_CAPSULE | Freq: Two times a day (BID) | ORAL | Status: DC

## 2020-05-04 NOTE — Sedation Documentation (Signed)
Angioseal deployed, right groin at 1121. Pulses +3, site level 0

## 2020-05-04 NOTE — H&P (Signed)
Chief Complaint: Patient was seen in consultation today for cerebral angio with intervention at the request of [place]  Referring Physician(s): Dr. Leonie Man  Supervising Physician: Luanne Bras  Patient Status: [facility] - Out-pt  History of Present Illness: Bethany [name] is a 69 y.o. female with known left MCA aneurysm followed by Dr. Estanislado Pandy. She had diagnostic angiogram a few months ago. She is now scheduled for endovascular treatment of this today. She was contact and the procedure was discussed and questions answered as well. She has known contrast allergy and has taken her pre-medication regimen including her last doses upon arrival today. She feels well otherwise, no recent fevers, chills, illness. She has also taken her Plavix as directed. PMHx, meds, labs, imaging, allergies reviewed. Has been NPO today as directed.    Past Medical History:  Diagnosis Date  . Acute renal failure ([place])   . Anemia   . Arthritis   . COPD (chronic obstructive pulmonary disease) ([place])   . Diabetes mellitus without complication ([place])    type II   . GERD (gastroesophageal reflux disease)   . Hypercalcemia   . Hyperlipidemia   . Hypertension   . Pneumonia   . Renal disorder   . Single kidney   . Stroke [facility])    July 2021  . Thrombosis   . Tobacco abuse     Past Surgical History:  Procedure Laterality Date  . ABDOMINAL HYSTERECTOMY    . blood clots removed from descending aorta     . CHOLECYSTECTOMY    . COLONOSCOPY    . COLONOSCOPY WITH PROPOFOL N/A 04/17/2017   Procedure: COLONOSCOPY WITH PROPOFOL;  Surgeon: Wilford Corner, MD;  Location: [facility] ENDOSCOPY;  Service: Endoscopy;  Laterality: N/A;  . ESOPHAGOGASTRODUODENOSCOPY (EGD) WITH PROPOFOL N/A 04/17/2017   Procedure: ESOPHAGOGASTRODUODENOSCOPY (EGD) WITH PROPOFOL;  Surgeon: Wilford Corner, MD;  Location: [facility] ENDOSCOPY;  Service: Endoscopy;  Laterality: N/A;  . IR ANGIO INTRA EXTRACRAN SEL COM CAROTID INNOMINATE  BILAT MOD SED  02/06/2020  . IR ANGIO VERTEBRAL SEL VERTEBRAL BILAT MOD SED  02/06/2020  . IR IMAGING GUIDED PORT INSERTION  05/01/2019  . IR US GUIDE VASC ACCESS RIGHT  02/06/2020  . NEPHRECTOMY RECIPIENT      Allergies: Contrast media [iodinated diagnostic agents]  Medications: Prior to Admission medications   Medication Sig Start Date End Date Taking? Authorizing Provider  allopurinol (ZYLOPRIM) 100 MG tablet Take 100 mg by mouth at bedtime.    [provider]  amLODipine (NORVASC) 10 MG tablet Take 10 mg by mouth daily.    [provider]  APPLE CIDER VINEGAR PO Take 1 tablet by mouth daily.    [provider]  aspirin EC 81 MG EC tablet Take 1 tablet (81 mg total) by mouth daily. Swallow whole. 11/22/19   Danford, Suann Larry, MD  atorvastatin (LIPITOR) 80 MG tablet Take 1 tablet (80 mg total) by mouth daily. 11/22/19   Danford, Suann Larry, MD  baclofen (LIORESAL) 10 MG tablet Take 10 mg by mouth 3 (three) times daily as needed for muscle spasms.    [provider]  buprenorphine Haze Rushing) 20 MCG/HR PTWK Place 1 patch onto the skin every Monday.  11/15/19   [provider]  clopidogrel (PLAVIX) 75 MG tablet Take 75 mg by mouth daily.    [provider]  Dulaglutide (TRULICITY) 1.5 KV/4.2VZ SOPN Inject 1.5 mg into the skin every Monday.     [provider]  ezetimibe (ZETIA) 10 MG tablet Take  10 mg by mouth daily.    [provider]  feeding supplement, ENSURE ENLIVE, (ENSURE ENLIVE) LIQD Take 237 mLs by mouth 2 (two) times daily between meals. 03/30/19   Bonnell Public, MD  furosemide (LASIX) 40 MG tablet Take 1 tablet (40 mg total) by mouth daily as needed. Patient taking differently: Take 40 mg by mouth daily as needed for fluid. 05/20/19 05/19/20  O'NealCassie Freer, MD  gabapentin (NEURONTIN) 600 MG tablet Take 600 mg by mouth 4 (four) times daily.     [provider]  insulin glargine, 1 Unit Dial,  (TOUJEO SOLOSTAR) 300 UNIT/ML Solostar Pen Inject 20 Units into the skin at bedtime.     [provider]  Ipratropium-Albuterol (COMBIVENT) 20-100 MCG/ACT AERS respimat Inhale 1 puff into the lungs every 6 (six) hours as needed for wheezing or shortness of breath. 03/17/17   Hongalgi, Lenis Dickinson, MD  levothyroxine (SYNTHROID) 75 MCG tablet Take 75 mcg by mouth daily. 02/28/19   [provider]  lidocaine-prilocaine (EMLA) cream Apply 1 application topically as needed. Patient taking differently: Apply 1 application topically daily as needed (port access). 07/03/19   Truitt Merle, MD  losartan (COZAAR) 25 MG tablet Take 1 tablet (25 mg total) by mouth daily. 11/22/19 11/21/20  Danford, Suann Larry, MD  metoprolol (LOPRESSOR) 50 MG tablet Take 50 mg by mouth 2 (two) times daily.  03/06/14   [provider]  Multiple Vitamin (MULTIVITAMIN WITH MINERALS) TABS tablet Take 1 tablet by mouth daily.    [provider]  NARCAN 4 MG/0.1ML LIQD nasal spray kit Place 1 spray into the nose as needed for opioid reversal. 08/22/19   [provider]  oxyCODONE-acetaminophen (PERCOCET) 10-325 MG tablet Take 1 tablet by mouth every 6 (six) hours as needed (breakthrough pain).     [provider]  pantoprazole (PROTONIX) 40 MG tablet Take 40 mg by mouth every morning.    [provider]  pregabalin (LYRICA) 25 MG capsule Take 25 mg by mouth 2 (two) times daily.  01/24/19   [provider]  sennosides-docusate sodium (SENOKOT-S) 8.6-50 MG tablet Take 1 tablet by mouth at bedtime.    [provider]  Vitamin D, Ergocalciferol, (DRISDOL) 1.25 MG (50000 UNIT) CAPS capsule Take 50,000 Units by mouth every Monday.     [provider]  XTAMPZA ER 27 MG C12A Take 27 mg by mouth 2 (two) times daily.  02/27/19   [provider]     Family History  Problem Relation Age of Onset  . Hypertension Mother   . Diabetes Father   . Hypertension  Brother   . Breast cancer Maternal Aunt        64s  . Breast cancer Maternal Aunt 75       colon cancer    Social History   Socioeconomic History  . Marital status: Single    Spouse name: Not on file  . Number of children: 3  . Years of education: Not on file  . Highest education level: Not on file  Occupational History  . Occupation: retired   Tobacco Use  . Smoking status: Current Every Day Smoker    Packs/day: 0.25    Years: 40.00    Pack years: 10.00    Types: Cigarettes  . Smokeless tobacco: Never Used  Vaping Use  . Vaping Use: Never used  Substance and Sexual Activity  . Alcohol use: Yes    Comment: socially  . Drug use:  No  . Sexual activity: Not on file  Other Topics Concern  . Not on file  Social History Narrative   Lives with daughter    Dorie Rank from [place]    Social Determinants of Health   Financial Resource Strain: Not on file  Food Insecurity: Not on file  Transportation Needs: Not on file  Physical Activity: Not on file  Stress: Not on file  Social Connections: Not on file    Review of Systems: A 12 point ROS discussed and pertinent positives are indicated in the HPI above.  All other systems are negative.  Review of Systems  Vital Signs: There were no vitals taken for this visit.  Physical Exam Constitutional:      Appearance: Normal appearance. She is not ill-appearing.  HENT:     Mouth/Throat:     Mouth: Mucous membranes are moist.     Pharynx: Oropharynx is clear.  Cardiovascular:     Rate and Rhythm: Normal rate and regular rhythm.     Pulses: Normal pulses.     Heart sounds: Normal heart sounds.     Comments: Excellent radial and pedal pulses bilaterally Pulmonary:     Effort: Pulmonary effort is normal. No respiratory distress.     Breath sounds: Normal breath sounds.  Skin:    General: Skin is warm and dry.  Neurological:     General: No focal deficit present.     Mental Status: She is alert and oriented to person, place, and  time.  Psychiatric:        Mood and Affect: Mood normal.        Thought Content: Thought content normal.        Judgment: Judgment normal.      Imaging: No results found.  Labs:  CBC: Recent Labs    03/13/20 1147 03/27/20 0940 04/24/20 1225 04/27/20 0719  WBC 8.0 9.2 7.2 12.1*  HGB 10.1* 9.1* 6.8* 10.1*  HCT 34.3* 31.0* 25.0* 33.6*  PLT 238 285 277 296    COAGS: Recent Labs    11/19/19 1317 02/06/20 0828 04/27/20 0719  INR 1.0 1.0 1.1  APTT 26  --  29    BMP: Recent Labs    11/20/19 0433 11/22/19 1139 01/24/20 1212 02/06/20 0718 02/06/20 1124 04/27/20 0719  NA 139 136 139 137 142 138  K 4.2 4.2 4.6 6.4* 5.4* 4.9  CL 103 103 107 103 108 105  CO2 '26 25 23 24  ' --  24  GLUCOSE 131* 169* 232* 410* 269* 228*  BUN 16 12 26* 28* 31* 24*  CALCIUM 9.9 9.8 10.8* 10.4*  --  10.1  CREATININE 1.01*  1.04* 1.03* 1.21* 1.56* 1.30* 1.58*  GFRNONAA 57*  55* 55* 46* 34*  --  35*  GFRAA >60  >60 >60 53* 39*  --   --     LIVER FUNCTION TESTS: Recent Labs    07/19/19 1340 11/19/19 1317 01/24/20 1212  BILITOT 0.5 0.7 0.3  AST 77* 20 28  ALT 119* 23 56*  ALKPHOS 330* 159* 237*  PROT 7.4 6.9 7.2  ALBUMIN 3.9 3.9 3.9    TUMOR MARKERS: No results for input(s): AFPTM, CEA, CA199, CHROMGRNA in the last 8760 hours.  Assessment and Plan: Left middle cerebral artery aneurysm For angiogram with endovascular treatment by means of embolization using WEB device versus coils + stent placement Labs pending Risks and benefits of cerebral angiogram with intervention were discussed with the patient including, but not limited to  bleeding, infection, vascular injury or contrast induced renal failure.  This interventional procedure involves the use of X-rays and because of the nature of the planned procedure, it is possible that we will have prolonged use of X-ray fluoroscopy.  Potential radiation risks to you include (but are not limited to) the following: - A slightly  elevated risk for cancer  several years later in life. This risk is typically less than 0.5% percent. This risk is low in comparison to the normal incidence of human cancer, which is 33% for women and 50% for men according to the [place]. - Radiation induced injury can include skin redness, resembling a rash, tissue breakdown / ulcers and hair loss (which can be temporary or permanent).   The likelihood of either of these occurring depends on the difficulty of the procedure and whether you are sensitive to radiation due to previous procedures, disease, or genetic conditions.   IF your procedure requires a prolonged use of radiation, you will be notified and given written instructions for further action.  It is your responsibility to monitor the irradiated area for the 2 weeks following the procedure and to notify your physician if you are concerned that you have suffered a radiation induced injury.    All of the patient's questions were answered, patient is agreeable to proceed.  Consent signed and in chart.     Thank you for this interesting consult.  I greatly enjoyed meeting Bethany [name] and look forward to participating in their care.  A copy of this report was sent to the requesting provider on this date.  Electronically Signed: Ascencion Dike, PA-C 05/04/2020, 7:56 AM   I spent a total of 20 minutes in face to face in clinical consultation, greater than 50% of which was counseling/coordinating care for cerebral angio with intervention

## 2020-05-04 NOTE — Transfer of Care (Signed)
Immediate Anesthesia Transfer of Care Note  Patient: Bethany [name]  Procedure(s) Performed: Sheppard Plumber (N/A )  Patient Location: PACU  Anesthesia Type:General  Level of Consciousness: awake and responds to stimulation  Airway & Oxygen Therapy: Patient connected to face mask oxygen  Post-op Assessment: Report given to RN and Post -op Vital signs reviewed and stable  Post vital signs: Reviewed and stable  Last Vitals:  Vitals Value Taken Time  BP 136/54 05/04/20 1218  Temp    Pulse 75 05/04/20 1220  Resp 15 05/04/20 1220  SpO2 97 % 05/04/20 1220  Vitals shown include unvalidated device data.  Last Pain:  Vitals:   05/04/20 0720  TempSrc:   PainSc: 10-Worst pain ever      Patients Stated Pain Goal: 5 (98/72/15 8727)  Complications: No complications documented.

## 2020-05-04 NOTE — Progress Notes (Signed)
ANTICOAGULATION CONSULT NOTE - Initial Consult  Pharmacy Consult for heparin Indication: Post neuro IR procedure  Allergies  Allergen Reactions  . Contrast Media [Iodinated Diagnostic Agents]     Renal hypertension per physician    Patient Measurements: Height: 5\' 5"  (165.1 cm) Weight: 80.7 kg (177 lb 14.6 oz) IBW/kg (Calculated) : 57 Heparin Dosing Weight: 74 kg  Vital Signs: Temp: 99.4 F (37.4 C) (12/20 1219) Temp Source: Oral (12/20 0644) BP: 129/51 (12/20 1324) Pulse Rate: 72 (12/20 1330)  Labs: No results for input(s): HGB, HCT, PLT, APTT, LABPROT, INR, HEPARINUNFRC, HEPRLOWMOCWT, CREATININE, CKTOTAL, CKMB, TROPONINIHS in the last 72 hours.  Estimated Creatinine Clearance: 34.8 mL/min (A) (by C-G formula based on SCr of 1.58 mg/dL (H)).   Medical History: Past Medical History:  Diagnosis Date  . Acute renal failure ([place])   . Anemia   . Arthritis   . COPD (chronic obstructive pulmonary disease) ([place])   . Diabetes mellitus without complication ([place])    type II   . GERD (gastroesophageal reflux disease)   . Hypercalcemia   . Hyperlipidemia   . Hypertension   . Pneumonia   . Renal disorder   . Single kidney   . Stroke [facility])    July 2021  . Thrombosis   . Tobacco abuse     Medications:  Medications Prior to Admission  Medication Sig Dispense Refill Last Dose  . allopurinol (ZYLOPRIM) 100 MG tablet Take 100 mg by mouth at bedtime.   05/03/2020 at Unknown time  . amLODipine (NORVASC) 10 MG tablet Take 10 mg by mouth daily.   05/03/2020 at Unknown time  . APPLE CIDER VINEGAR PO Take 1 tablet by mouth daily.   05/03/2020 at Unknown time  . aspirin EC 81 MG EC tablet Take 1 tablet (81 mg total) by mouth daily. Swallow whole. 30 tablet 11 05/03/2020 at Unknown time  . atorvastatin (LIPITOR) 80 MG tablet Take 1 tablet (80 mg total) by mouth daily. 30 tablet 11 05/03/2020 at Unknown time  . baclofen (LIORESAL) 10 MG tablet Take 10 mg by mouth 3 (three) times daily  as needed for muscle spasms.   05/03/2020 at Unknown time  . buprenorphine (BUTRANS) 20 MCG/HR PTWK Place 1 patch onto the skin every Monday.    Past Week at Unknown time  . clopidogrel (PLAVIX) 75 MG tablet Take 75 mg by mouth daily.   05/04/2020 at 0400  . Dulaglutide (TRULICITY) 1.5 GG/2.6RS SOPN Inject 1.5 mg into the skin every Monday.    Past Week at Unknown time  . ezetimibe (ZETIA) 10 MG tablet Take 10 mg by mouth daily.   05/03/2020 at Unknown time  . gabapentin (NEURONTIN) 600 MG tablet Take 600 mg by mouth 4 (four) times daily.    05/04/2020 at [place]  . insulin glargine, 1 Unit Dial, (TOUJEO SOLOSTAR) 300 UNIT/ML Solostar Pen Inject 20 Units into the skin at bedtime.    05/03/2020 at Unknown time  . Ipratropium-Albuterol (COMBIVENT) 20-100 MCG/ACT AERS respimat Inhale 1 puff into the lungs every 6 (six) hours as needed for wheezing or shortness of breath. 1 Inhaler 0 05/04/2020 at 0400  . levothyroxine (SYNTHROID) 75 MCG tablet Take 75 mcg by mouth daily.   05/04/2020 at 0400  . losartan (COZAAR) 25 MG tablet Take 1 tablet (25 mg total) by mouth daily. 30 tablet 11 05/03/2020 at Unknown time  . metoprolol (LOPRESSOR) 50 MG tablet Take 50 mg by mouth 2 (two) times daily.    05/03/2020  at Unknown time  . Multiple Vitamin (MULTIVITAMIN WITH MINERALS) TABS tablet Take 1 tablet by mouth daily.   05/03/2020 at Unknown time  . oxyCODONE-acetaminophen (PERCOCET) 10-325 MG tablet Take 1 tablet by mouth every 6 (six) hours as needed (breakthrough pain).    05/03/2020 at Unknown time  . pregabalin (LYRICA) 25 MG capsule Take 25 mg by mouth 2 (two) times daily.    05/03/2020 at Unknown time  . sennosides-docusate sodium (SENOKOT-S) 8.6-50 MG tablet Take 1 tablet by mouth at bedtime.   05/03/2020 at Unknown time  . Vitamin D, Ergocalciferol, (DRISDOL) 1.25 MG (50000 UNIT) CAPS capsule Take 50,000 Units by mouth every Monday.    Past Week at Unknown time  . XTAMPZA ER 27 MG C12A Take 27 mg by mouth 2  (two) times daily.    05/03/2020 at Unknown time  . feeding supplement, ENSURE ENLIVE, (ENSURE ENLIVE) LIQD Take 237 mLs by mouth 2 (two) times daily between meals. 237 mL 12 Unknown at Unknown time  . furosemide (LASIX) 40 MG tablet Take 1 tablet (40 mg total) by mouth daily as needed. (Patient taking differently: Take 40 mg by mouth daily as needed for fluid.) 45 tablet 3 More than a month at Unknown time  . lidocaine-prilocaine (EMLA) cream Apply 1 application topically as needed. (Patient taking differently: Apply 1 application topically daily as needed (port access).) 30 g 1   . NARCAN 4 MG/0.1ML LIQD nasal spray kit Place 1 spray into the nose as needed for opioid reversal.     . pantoprazole (PROTONIX) 40 MG tablet Take 40 mg by mouth every morning.       Assessment: 73 YOF s/p embolization of MCA aneurysm on IV heparin post procedure per neuro-IR protocol.   Currently on IV heparin at 500 units/hr. Of note, patient developed stroke symptoms post procedure but head CT showed no ICH. Per IR, okay to continue IV heparin in the absence of a bleed.   Goal of Therapy:  Heparin level 0.1-0.25 units/ml Monitor platelets by anticoagulation protocol: Yes   Plan:  -Increase IV heparin to 600 units/hr  -F/u 8 hr HL -IV heparin to stop at 0700 tomorrow AM  Albertina Parr, PharmD., BCPS, BCCCP Clinical Pharmacist Please refer to [facility] for unit-specific pharmacist

## 2020-05-04 NOTE — Anesthesia Procedure Notes (Signed)
Arterial Line Insertion Start/End12/20/2021 8:15 AM, 05/04/2020 8:18 AM Performed by: Glynda Jaeger, CRNA, CRNA  Preanesthetic checklist: patient identified, IV checked, site marked, risks and benefits discussed, surgical consent, monitors and equipment checked, pre-op evaluation, timeout performed and anesthesia consent Lidocaine 1% used for infiltration Left, radial was placed Catheter size: 20 G Hand hygiene performed  and maximum sterile barriers used  Allen's test indicative of satisfactory collateral circulation Attempts: 1 Procedure performed without using ultrasound guided technique. Following insertion, dressing applied and Biopatch. Post procedure assessment: normal  Patient tolerated the procedure well with no immediate complications.

## 2020-05-04 NOTE — Progress Notes (Signed)
NIR.  Patient underwent an image-guided cerebral arteriogram with embolization of left MCA aneurysm using WEB device via right femoral approach this AM by Dr. Estanislado Pandy.  Patient evaluated bedside in PACU following procedure, alongside Dr. Estanislado Pandy at approximately 1345. Patient awake and alert laying in bed. Demonstrates speech difficulty- is perseverating (answers every question with "yellow flag", identifies objects with "yellow flag", unable to name family by name and only says "yellow flag"). Intermittently follows simple commands (protudes tongue on command, midline; unable to follow EOMs to command). Moving all extremities however with right upper/lower extremity drift. Right grion puncture site soft without active bleeding or hematoma; distal pulses (DPs) 2+ bilaterally. BP 130s/50s, HR 60s-70s.  Code stroke was paged, neurology at bedside, patient taken to CT for STAT CT head along with CTA head/neck.  Dr. Estanislado Pandy called patient's brother, Donelda Mailhot, at 1402 to update on above.  Myself, alongside Dr. Estanislado Pandy and neurology in [place] with patient. CT head negative for acute infact. CTA head/neck negative for LVO.  Patient re-evaluated in CT following CT scan results at approximately 1435. Patient with improvement in speech but still perseverating. Unable to state where she is or the date. Intermittently follows simple commands. Patient to transfer to neuro ICU for overnight observation.  NIR to follow.   Bea Graff Agata Lucente, PA-C 05/04/2020, 2:47 PM

## 2020-05-04 NOTE — Progress Notes (Signed)
Patient ID: Bethany [name], female   DOB: 1950-09-13, 69 y.o.   MRN: [id number] INR. 38 Y R H F MRS 0  Scheduled for endovascular treatment of  Unruptured Lt MCA  Aneurysm. Clinically no new neurological symptoms. Denies any chest pains ,SOB palpitations,PND or ankle swelling. Denies any cough,sputum production hemoptysis . Intermittent wheezing requiring use of an inhaler. No N/V difficulty swallowing .  No constipation or diarrhea or melena. Denies any dysuria,or hematuria. Denies any chills ,fever or rigors.  O/E alert Ox 3 . No gross lateralizing neuro deficits..  Endovascular treatment options  discussed with patient and brother. These include treatment with a WEB device, versus coiling v stent assisted coiling.. Risks of thromboembolic stroke,new neuro deficits which could be disabling,death,  ICH (SAH from aneurysm rupture),need for emergent surgery worsening renal function,ANF discussed.  Procedure could be aborted if it was  felt to increase the risk of a complication. Patient expressed understanding and wishes to proceed.Informed consent obtained. S.Taraann Olthoff MD

## 2020-05-04 NOTE — Procedures (Signed)
S/P Lt common carotid arteriogram followed by embolization of L tMCA aneurysm with a 4.57mm x 34mm WEB flow disrupter device. Patient extubated.maintaining O2 sats. Opens eyes to name. No moving extremities to command or spontaneously. RT groin soft. 81F angioseal used for hemostasis in the rt groin. Distal pulses all palpable. Pupils 2 to 3 mm equal and sluggishly reactive. S.Marybeth Dandy MD.

## 2020-05-04 NOTE — Progress Notes (Signed)
EEG complete - results pending 

## 2020-05-04 NOTE — Anesthesia Procedure Notes (Signed)
Procedure Name: Intubation Date/Time: 05/04/2020 9:09 AM Performed by: Glynda Jaeger, CRNA Pre-anesthesia Checklist: Patient identified, Emergency Drugs available, Suction available and Patient being monitored Patient Re-evaluated:Patient Re-evaluated prior to induction Oxygen Delivery Method: Circle System Utilized Preoxygenation: Pre-oxygenation with 100% oxygen Induction Type: IV induction Ventilation: Mask ventilation without difficulty Laryngoscope Size: Mac and 3 Grade View: Grade I Tube type: Oral Tube size: 7.0 mm Number of attempts: 1 Airway Equipment and Method: Stylet and Oral airway Placement Confirmation: ETT inserted through vocal cords under direct vision,  positive ETCO2 and breath sounds checked- equal and bilateral Secured at: 21 cm Tube secured with: Tape Dental Injury: Teeth and Oropharynx as per pre-operative assessment

## 2020-05-04 NOTE — Progress Notes (Signed)
Pt. Took prednisone 500 mg and benadryl  2 tabs, 25 mg each at 0750. Dt Deveshwar aware.

## 2020-05-04 NOTE — Consult Note (Signed)
Reason for Consult:code stroke Referring Physician: Juliet Rude Bethany [name] is an 69 y.o. female.  HPI:  Bethany [name] is a 69 y.o. female post surgical with secured aneurysm from PACU where she was recovering . LKW 0845 when patient was brought in for procedure. Procedure ended at 1145 and she went to recovery. At 1300, pt started speaking and noted to have repetitive speech. Code stroke was activated by Dr. Estanislado Pandy. On heparin IV.  Stroke team arrived at the bedside to assess in PACU. Patient to CT with team. NIHSS 8, see documentation for details and code stroke times. Patient with disoriented, not following commands, right arm weakness, right leg weakness, Global aphasia  and Sensory  neglect on exam.  Code stroke CT scan personally reviewed showed no acute abnormality and showed aneurysm coiling artifact in the left MCA region.  CT angiogram of the brain showed patent left middle cerebral artery and major branches.  Patient will be admitted to the neurological ICU for further observation.  Plan to check EEG for seizures and MRI scan to look for small embolic infarcts related to the procedure. Last seen normal 8:45 AM prior to procedure IV TPA considered no as patient is on IV heparin. Mechanical thrombectomy considered no as no LVO found  Past neurological history significant for left MCA infarct with petechial hemorrhage of cryptogenic etiology in July 2021 at which time incidental 5 mm left MCA bifurcation aneurysm was found. Past Medical History:  Diagnosis Date  . Acute renal failure ([place])   . Anemia   . Arthritis   . COPD (chronic obstructive pulmonary disease) ([place])   . Diabetes mellitus without complication ([place])    type II   . GERD (gastroesophageal reflux disease)   . Hypercalcemia   . Hyperlipidemia   . Hypertension   . Pneumonia   . Renal disorder   . Single kidney   . Stroke [facility])    July 2021  . Thrombosis   . Tobacco abuse     Past Surgical History:   Procedure Laterality Date  . ABDOMINAL HYSTERECTOMY    . blood clots removed from descending aorta     . CHOLECYSTECTOMY    . COLONOSCOPY    . COLONOSCOPY WITH PROPOFOL N/A 04/17/2017   Procedure: COLONOSCOPY WITH PROPOFOL;  Surgeon: Wilford Corner, MD;  Location: [facility];  Service: Endoscopy;  Laterality: N/A;  . ESOPHAGOGASTRODUODENOSCOPY (EGD) WITH PROPOFOL N/A 04/17/2017   Procedure: ESOPHAGOGASTRODUODENOSCOPY (EGD) WITH PROPOFOL;  Surgeon: Wilford Corner, MD;  Location: [facility];  Service: Endoscopy;  Laterality: N/A;  . IR ANGIO INTRA EXTRACRAN SEL COM CAROTID INNOMINATE BILAT MOD SED  02/06/2020  . IR ANGIO VERTEBRAL SEL VERTEBRAL BILAT MOD SED  02/06/2020  . IR IMAGING GUIDED PORT INSERTION  05/01/2019  . IR US GUIDE VASC ACCESS RIGHT  02/06/2020  . NEPHRECTOMY RECIPIENT      Family History  Problem Relation Age of Onset  . Hypertension Mother   . Diabetes Father   . Hypertension Brother   . Breast cancer Maternal Aunt        44s  . Breast cancer Maternal Aunt 75       colon cancer    Social History:  reports that she has been smoking cigarettes. She has a 10.00 pack-year smoking history. She has never used smokeless tobacco. She reports current alcohol use. She reports that she does not use drugs.  Allergies:  Allergies  Allergen Reactions  . Contrast Media [Iodinated Diagnostic Agents]  Renal hypertension per physician    Medications: I have reviewed the patient's current medications.  Results for orders placed or performed during the hospital encounter of 05/04/20 (from the past 48 hour(s))  SARS Coronavirus 2 by RT PCR (hospital order, performed in [facility] hospital lab) Nasopharyngeal Nasopharyngeal Swab     Status: None   Collection Time: 05/04/20  6:22 AM   Specimen: Nasopharyngeal Swab  Result Value Ref Range   SARS Coronavirus 2 NEGATIVE NEGATIVE    Comment: (NOTE) SARS-CoV-2 target nucleic acids are NOT DETECTED.  The SARS-CoV-2 RNA  is generally detectable in upper and lower respiratory specimens during the acute phase of infection. The lowest concentration of SARS-CoV-2 viral copies this assay can detect is 250 copies / mL. A negative result does not preclude SARS-CoV-2 infection and should not be used as the sole basis for treatment or other patient management decisions.  A negative result may occur with improper specimen collection / handling, submission of specimen other than nasopharyngeal swab, presence of viral mutation(s) within the areas targeted by this assay, and inadequate number of viral copies (<250 copies / mL). A negative result must be combined with clinical observations, patient history, and epidemiological information.  Fact Sheet for Patients:   StrictlyIdeas.no  Fact Sheet for Healthcare Providers: BankingDealers.co.za  This test is not yet approved or  cleared by the [place] FDA and has been authorized for detection and/or diagnosis of SARS-CoV-2 by FDA under an Emergency Use Authorization (EUA).  This EUA will remain in effect (meaning this test can be used) for the duration of the COVID-19 declaration under Section 564(b)(1) of the Act, 21 U.S.C. section 360bbb-3(b)(1), unless the authorization is terminated or revoked sooner.  Performed at [facility], [place] [address] [place], [place] [address]   Glucose, capillary     Status: Abnormal   Collection Time: 05/04/20  6:49 AM  Result Value Ref Range   Glucose-Capillary 260 (H) 70 - 99 mg/dL    Comment: Glucose reference range applies only to samples taken after fasting for at least 8 hours.  Platelet inhibition p2y12 (not at [facility])     Status: Abnormal   Collection Time: 05/04/20  6:57 AM  Result Value Ref Range   Platelet Function  P2Y12 102 (L) 182 - 335 PRU    Comment: (NOTE) The literature has shown a direct correlation of PRU values over 230 with higher risks of thrombotic  events. Lower PRU values are associated with platelet inhibition. Performed at [facility], [place] [address] [place], [place] [address]   Prepare RBC (crossmatch)     Status: None   Collection Time: 05/04/20  8:54 AM  Result Value Ref Range   Order Confirmation      ORDER PROCESSED BY BLOOD BANK Performed at [facility], [place] [address], [place], [place] [address]   Urinalysis, Routine w reflex microscopic Nasal Mucosa     Status: Abnormal   Collection Time: 05/04/20  4:19 PM  Result Value Ref Range   Color, Urine YELLOW YELLOW   APPearance CLEAR CLEAR   Specific Gravity, Urine 1.032 (H) 1.005 - 1.030   pH 5.0 5.0 - 8.0   Glucose, UA NEGATIVE NEGATIVE mg/dL   Hgb urine dipstick NEGATIVE NEGATIVE   Bilirubin Urine NEGATIVE NEGATIVE   Ketones, ur NEGATIVE NEGATIVE mg/dL   Protein, ur NEGATIVE NEGATIVE mg/dL   Nitrite POSITIVE (A) NEGATIVE   Leukocytes,Ua NEGATIVE NEGATIVE   RBC / HPF 0-5  0 - 5 RBC/hpf   WBC, UA 0-5 0 - 5 WBC/hpf   Bacteria, UA NONE SEEN NONE SEEN    Comment: Performed at [facility], [place] [address], [place], [place] [address]  Glucose, capillary     Status: Abnormal   Collection Time: 05/04/20  4:43 PM  Result Value Ref Range   Glucose-Capillary 236 (H) 70 - 99 mg/dL    Comment: Glucose reference range applies only to samples taken after fasting for at least 8 hours.  CBC with Differential     Status: Abnormal   Collection Time: 05/04/20  4:45 PM  Result Value Ref Range   WBC 14.0 (H) 4.0 - 10.5 K/uL   RBC 3.40 (L) 3.87 - 5.11 MIL/uL   Hemoglobin 8.8 (L) 12.0 - 15.0 g/dL   HCT 29.7 (L) 36.0 - 46.0 %   MCV 87.4 80.0 - 100.0 fL   MCH 25.9 (L) 26.0 - 34.0 pg   MCHC 29.6 (L) 30.0 - 36.0 g/dL   RDW 17.8 (H) 11.5 - 15.5 %   Platelets 229 150 - 400 K/uL   nRBC 0.0 0.0 - 0.2 %   Neutrophils Relative % 93 %   Neutro Abs 13.1 (H) 1.7 - 7.7 K/uL   Lymphocytes Relative 4 %   Lymphs Abs 0.5 (L) 0.7 - 4.0 K/uL   Monocytes Relative 2 %    Monocytes Absolute 0.3 0.1 - 1.0 K/uL   Eosinophils Relative 0 %   Eosinophils Absolute 0.0 0.0 - 0.5 K/uL   Basophils Relative 0 %   Basophils Absolute 0.0 0.0 - 0.1 K/uL   Immature Granulocytes 1 %   Abs Immature Granulocytes 0.08 (H) 0.00 - 0.07 K/uL    Comment: Performed at [facility], [address] [place], [place] [address]  Comprehensive metabolic panel     Status: Abnormal   Collection Time: 05/04/20  4:45 PM  Result Value Ref Range   Sodium 137 135 - 145 mmol/L   Potassium 5.0 3.5 - 5.1 mmol/L   Chloride 108 98 - 111 mmol/L   CO2 21 (L) 22 - 32 mmol/L   Glucose, Bld 242 (H) 70 - 99 mg/dL    Comment: Glucose reference range applies only to samples taken after fasting for at least 8 hours.   BUN 27 (H) 8 - 23 mg/dL   Creatinine, Ser 1.26 (H) 0.44 - 1.00 mg/dL   Calcium 8.7 (L) 8.9 - 10.3 mg/dL   Total Protein 5.3 (L) 6.5 - 8.1 g/dL   Albumin 3.0 (L) 3.5 - 5.0 g/dL   AST 31 15 - 41 U/L   ALT 65 (H) 0 - 44 U/L   Alkaline Phosphatase 192 (H) 38 - 126 U/L   Total Bilirubin 0.6 0.3 - 1.2 mg/dL   GFR, Estimated 46 (L) >60 mL/min    Comment: (NOTE) Calculated using the CKD-EPI Creatinine Equation (2021)    Anion gap 8 5 - 15    Comment: Performed at [facility], [place] [address], [place],  [address]  Hemoglobin A1c     Status: Abnormal   Collection Time: 05/04/20  4:58 PM  Result Value Ref Range   Hgb A1c MFr Bld 4.7 (L) 4.8 - 5.6 %    Comment: (NOTE) Pre diabetes:          5.7%-6.4%  Diabetes:              >6.4%  Glycemic control for   <7.0% adults  with diabetes    Mean Plasma Glucose 88.19 mg/dL    Comment: Performed at [facility], [place] [address], [place], [place] [address]    CT HEAD CODE STROKE WO CONTRAST  Result Date: 05/04/2020 CLINICAL DATA:  Code stroke.  Stroke follow-up. EXAM: CT HEAD WITHOUT CONTRAST TECHNIQUE: Contiguous axial images were obtained from the base of the skull through the vertex without intravenous contrast.  COMPARISON:  CT head and MRI from July 6, 21. FINDINGS: Brain: Evolving encephalomalacia in the lateral left temporal lobe, in the region of the previously characterized infarct. Decreased edema and mass effect. No evidence of new/acute large vascular territory infarct. No acute hemorrhage. Vascular: No hyperdense vessel identified. Post-surgical change related to left MCA aneurysm embolization with a web flow disrupter device. Skull: No acute fracture. Sinuses/Orbits: Mild paranasal sinus mucosal thickening without air-fluid levels. Unremarkable orbits. Other: No mastoid effusions. ASPECTS [facility] Stroke Program Early CT Score) total score (0-10 with 10 being normal): 10 IMPRESSION: 1. No evidence of acute intracranial abnormality. ASPECTS is 10. 2. Evolving encephalomalacia in the lateral left temporal lobe, in the region of the previously characterized infarct. 3. Post-surgical change related to left MCA aneurysm embolization with a web flow disrupter device. Code stroke imaging results were communicated on 05/04/2020 at 2:22 pm to provider Dr. Leonie Man via telephone, who verbally acknowledged these results. Electronically Signed   By: Margaretha Sheffield MD   On: 05/04/2020 14:28   CT ANGIO HEAD CODE STROKE  Result Date: 05/04/2020 CLINICAL DATA:  Stroke suspected. EXAM: CT ANGIOGRAPHY HEAD AND NECK TECHNIQUE: Multidetector CT imaging of the head and neck was performed using the standard protocol during bolus administration of intravenous contrast. Multiplanar CT image reconstructions and MIPs were obtained to evaluate the vascular anatomy. Carotid stenosis measurements (when applicable) are obtained utilizing NASCET criteria, using the distal internal carotid diameter as the denominator. CONTRAST:  95mL OMNIPAQUE IOHEXOL 350 MG/ML SOLN COMPARISON:  MRA November 20, 2019. CT chest June 15 21. FINDINGS: CTA NECK FINDINGS Aortic arch: Great vessel origins are patent. Right carotid system: No evidence of dissection,  stenosis (50% or greater) or occlusion. Atherosclerosis at the bifurcation. Left carotid system: No evidence of dissection, stenosis (50% or greater) or occlusion. Atherosclerosis at the bifurcation. Vertebral arteries: Codominant. No evidence of dissection, stenosis (50% or greater) or occlusion. Skeleton: No acute findings. Other neck: No mass or suspicious adenopathy. Upper chest: Small bilateral pleural effusions. Patchy opacities in the lung apices may represent aspiration and/or pneumonia. Review of the MIP images confirms the above findings CTA HEAD FINDINGS Anterior circulation: No significant proximal stenosis or large vessel occlusion. Status post treatment of a left MCA aneurysm with web flow disrupted device. There is some expected flow within the aneurysm sac. Approximately 2 mm left posterior communicating artery aneurysm (see series 7, image 109). Distal left M2/M3 MCA branch stenosis was better characterized on the prior MRA. Posterior circulation: No significant stenosis, proximal occlusion, aneurysm, or vascular malformation. Mild stenosis of the basilar artery. Venous sinuses: As permitted by contrast timing, patent. Review of the MIP images confirms the above findings IMPRESSION: 1. No large vessel occlusion or new hemodynamically significant proximal stenosis in the head or neck. 2. Status post treatment of a left MCA aneurysm with web flow disrupted device. There is some expected flow within the aneurysm sac. 3. 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 4. Small bilateral pleural effusions. Patchy opacities in  the lung apices may represent aspiration and/or pneumonia. Additionally, there appears to be a new 1 cm subpleural nodular opacity in the imaged lateral left upper lobe that is new since CT chest from October 29, 2019. While this most likely represents an infectious/inflammatory  process, recommend follow-chest CT when the patient is able to exclude pulmonary nodule. Critical findings discussed with Dr. Marjory Lies at 2:24 PM and Dr. Leonie Man at 2:45 PM via telephone. Lung findings discussed with Dr. Leonie Man at 3:00 PM. Electronically Signed   By: Margaretha Sheffield MD   On: 05/04/2020 15:05   CT ANGIO NECK CODE STROKE  Result Date: 05/04/2020 CLINICAL DATA:  Stroke suspected. EXAM: CT ANGIOGRAPHY HEAD AND NECK TECHNIQUE: Multidetector CT imaging of the head and neck was performed using the standard protocol during bolus administration of intravenous contrast. Multiplanar CT image reconstructions and MIPs were obtained to evaluate the vascular anatomy. Carotid stenosis measurements (when applicable) are obtained utilizing NASCET criteria, using the distal internal carotid diameter as the denominator. CONTRAST:  37mL OMNIPAQUE IOHEXOL 350 MG/ML SOLN COMPARISON:  MRA November 20, 2019. CT chest June 15 21. FINDINGS: CTA NECK FINDINGS Aortic arch: Great vessel origins are patent. Right carotid system: No evidence of dissection, stenosis (50% or greater) or occlusion. Atherosclerosis at the bifurcation. Left carotid system: No evidence of dissection, stenosis (50% or greater) or occlusion. Atherosclerosis at the bifurcation. Vertebral arteries: Codominant. No evidence of dissection, stenosis (50% or greater) or occlusion. Skeleton: No acute findings. Other neck: No mass or suspicious adenopathy. Upper chest: Small bilateral pleural effusions. Patchy opacities in the lung apices may represent aspiration and/or pneumonia. Review of the MIP images confirms the above findings CTA HEAD FINDINGS Anterior circulation: No significant proximal stenosis or large vessel occlusion. Status post treatment of a left MCA aneurysm with web flow disrupted device. There is some expected flow within the aneurysm sac. Approximately 2 mm left posterior communicating artery aneurysm (see series 7, image 109). Distal left  M2/M3 MCA branch stenosis was better characterized on the prior MRA. Posterior circulation: No significant stenosis, proximal occlusion, aneurysm, or vascular malformation. Mild stenosis of the basilar artery. Venous sinuses: As permitted by contrast timing, patent. Review of the MIP images confirms the above findings IMPRESSION: 1. No large vessel occlusion or new hemodynamically significant proximal stenosis in the head or neck. 2. Status post treatment of a left MCA aneurysm with web flow disrupted device. There is some expected flow within the aneurysm sac. 3. 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 4. Small bilateral pleural effusions. Patchy opacities in the lung apices may represent aspiration and/or pneumonia. Additionally, there appears to be a new 1 cm subpleural nodular opacity in the imaged lateral left upper lobe that is new since CT chest from October 29, 2019. While this most likely represents an infectious/inflammatory process, recommend follow-chest CT when the patient is able to exclude pulmonary nodule. Critical findings discussed with Dr. Marjory Lies at 2:24 PM and Dr. Leonie Man at 2:45 PM via telephone. Lung findings discussed with Dr. Leonie Man at 3:00 PM. Electronically Signed   By: Margaretha Sheffield MD   On: 05/04/2020 15:05      ROS Blood pressure (!) 130/50, pulse 65, temperature 97.8 F (36.6 C), temperature source Oral, resp. rate 17, height 5\' 5"  (1.651 m), weight 81.8 kg, SpO2 100 %. Physical Exam Elderly obese African-American lady not in distress. . Afebrile. Head is nontraumatic. Neck is supple without bruit.  Cardiac exam no murmur or gallop. Lungs are clear to auscultation. Distal pulses are well felt. Neurological Exam : Patient is slightly drowsy but can be easily aroused.  She appears to be globally aphasic but can speak occasional words and has echolalia and repeats the  same phrases multiple times.  She follows simple commands like sticking out her tongue but is not consistent with following peripheral commands.  There is no facial weakness.  Extraocular movements appear full.  She blinks to threat bilaterally.  She is able to move all 4 extremities but tends to move the right arm and leg less than the left.  She is not cooperative for detailed muscle testing.  Sensation appears preserved bilaterally.  Both plantars are downgoing.  Gait not tested.  She has involuntary intermittent jerking of the right arm.  She has asterixis and flapping tremor in the right arm. Assessment: 69 year old lady with known prior history of left MCA infarct with mild residual aphasia and right-sided weakness with sudden onset of altered mental status following elective left middle cerebral artery bifurcation aneurysm successful coiling with exam consistent with aphasia and mild right-sided weakness and encephalopathy etiology indeterminate likely small multiple emboli in the left MCA distribution post post procedure.  Other possibilities include seizures or encephalopathy related to anesthesia medication less likely. Plan: Admit to the neurological intensive care unit for close observation.  Continue IV heparin when drip overnight for 24 hours due to recent aneurysm coiling procedure.  Check EEG for seizure activity.  Check CBC and CMP and liver function tests.  Ammonia level.  MRI scan of the brain without contrast to look for strokes.  Patient is not a candidate for TPA due to being on a heparin and no LVO has been found to suggest mechanical thrombectomy.  Long discussion at the bedside with the patient's brother and with Dr. Estanislado Pandy and answered questions. This patient is critically ill and at significant risk of neurological worsening, death and care requires constant monitoring of vital signs, hemodynamics,respiratory and cardiac monitoring, extensive review of multiple databases, frequent  neurological assessment, discussion with family, other specialists and medical decision making of high complexity.I have made any additions or clarifications directly to the above note.This critical care time does not reflect procedure time, or teaching time or supervisory time of PA/NP/Med Resident etc but could involve care discussion time.  I spent 50 minutes of neurocritical care time  in the care of  this patient. I have personally obtained history,examined this patient, reviewed notes, independently viewed imaging studies, participated in medical decision making and plan of care.ROS completed by me personally and pertinent positives fully documented  I have made any additions or clarifications directly to the above note.    Antony Contras, MD Medical Director [facility] St[facility] Center Pager: [phone] 05/04/2020 6:13 PM    Antony Contras 05/04/2020, 6:05 PM    Note: This document was prepared with digital dictation and possible smart phrase technology. Any transcriptional errors that result from this process are unintentional.

## 2020-05-04 NOTE — Anesthesia Postprocedure Evaluation (Signed)
Anesthesia Post Note  Patient: Bethany [name]  Procedure(s) Performed: Sheppard Plumber (N/A )     Patient location during evaluation: PACU Anesthesia Type: General Level of consciousness: awake and alert Pain management: pain level controlled Vital Signs Assessment: post-procedure vital signs reviewed and stable Respiratory status: spontaneous breathing, nonlabored ventilation and respiratory function stable Cardiovascular status: blood pressure returned to baseline and stable Postop Assessment: no apparent nausea or vomiting Anesthetic complications: no   No complications documented.  Last Vitals:  Vitals:   05/04/20 1245 05/04/20 1300  BP:    Pulse: 80 74  Resp: (!) 23 (!) 22  Temp:    SpO2: 98% 98%                  Audry Pili

## 2020-05-04 NOTE — Progress Notes (Signed)
Per Dr Estanislado Pandy, BP parameters now 120-160 with goal BP closer to 160.

## 2020-05-04 NOTE — Code Documentation (Signed)
Stroke Response Nurse Documentation Code Documentation  Bethany [name] is a 69 y.o. female post surgical with secured aneurysm from PACU where she was recovering. LKW 0845 when patient was brought in for procedure. Procedure ended at 1145 and she went to recovery. At 1300, pt started speaking and noted to have repetitive speech. Code stroke was activated by Dr. Estanislado Pandy. On heparin IV.   Stroke team at the bedside to assess in PACU. Patient to CT with team. NIHSS 8, see documentation for details and code stroke times. Patient with disoriented, not following commands, right arm weakness, right leg weakness, Global aphasia  and Sensory  neglect on exam. The following imaging was completed:  CT, CTA head and neck. Patient is not a candidate for tPA due to recent procedure and heparin. Care/Plan: q2 mNIHSS/VS. BP 120-160 mmhg. Bedside handoff with Estill Bamberg, [place] ICU.    Kathrin Greathouse  Stroke Response RN

## 2020-05-05 ENCOUNTER — Observation Stay (HOSPITAL_COMMUNITY): Payer: Medicare Other

## 2020-05-05 ENCOUNTER — Encounter (HOSPITAL_COMMUNITY): Payer: Self-pay | Admitting: Interventional Radiology

## 2020-05-05 DIAGNOSIS — R251 Tremor, unspecified: Secondary | ICD-10-CM | POA: Diagnosis not present

## 2020-05-05 DIAGNOSIS — R5381 Other malaise: Secondary | ICD-10-CM | POA: Diagnosis not present

## 2020-05-05 DIAGNOSIS — F17210 Nicotine dependence, cigarettes, uncomplicated: Secondary | ICD-10-CM | POA: Diagnosis present

## 2020-05-05 DIAGNOSIS — R29708 NIHSS score 8: Secondary | ICD-10-CM | POA: Diagnosis not present

## 2020-05-05 DIAGNOSIS — G9349 Other encephalopathy: Secondary | ICD-10-CM | POA: Diagnosis not present

## 2020-05-05 DIAGNOSIS — G8191 Hemiplegia, unspecified affecting right dominant side: Secondary | ICD-10-CM | POA: Diagnosis not present

## 2020-05-05 DIAGNOSIS — I671 Cerebral aneurysm, nonruptured: Principal | ICD-10-CM

## 2020-05-05 DIAGNOSIS — R4701 Aphasia: Secondary | ICD-10-CM | POA: Diagnosis not present

## 2020-05-05 DIAGNOSIS — J449 Chronic obstructive pulmonary disease, unspecified: Secondary | ICD-10-CM | POA: Diagnosis present

## 2020-05-05 DIAGNOSIS — I63412 Cerebral infarction due to embolism of left middle cerebral artery: Secondary | ICD-10-CM | POA: Diagnosis not present

## 2020-05-05 DIAGNOSIS — M436 Torticollis: Secondary | ICD-10-CM | POA: Diagnosis present

## 2020-05-05 DIAGNOSIS — R300 Dysuria: Secondary | ICD-10-CM | POA: Diagnosis not present

## 2020-05-05 DIAGNOSIS — K5900 Constipation, unspecified: Secondary | ICD-10-CM | POA: Diagnosis not present

## 2020-05-05 DIAGNOSIS — G894 Chronic pain syndrome: Secondary | ICD-10-CM | POA: Diagnosis present

## 2020-05-05 DIAGNOSIS — E1122 Type 2 diabetes mellitus with diabetic chronic kidney disease: Secondary | ICD-10-CM | POA: Diagnosis present

## 2020-05-05 DIAGNOSIS — K219 Gastro-esophageal reflux disease without esophagitis: Secondary | ICD-10-CM | POA: Diagnosis present

## 2020-05-05 DIAGNOSIS — R471 Dysarthria and anarthria: Secondary | ICD-10-CM | POA: Diagnosis not present

## 2020-05-05 DIAGNOSIS — I63512 Cerebral infarction due to unspecified occlusion or stenosis of left middle cerebral artery: Secondary | ICD-10-CM | POA: Diagnosis not present

## 2020-05-05 DIAGNOSIS — I97821 Postprocedural cerebrovascular infarction during other surgery: Secondary | ICD-10-CM | POA: Diagnosis not present

## 2020-05-05 DIAGNOSIS — E785 Hyperlipidemia, unspecified: Secondary | ICD-10-CM | POA: Diagnosis present

## 2020-05-05 DIAGNOSIS — R488 Other symbolic dysfunctions: Secondary | ICD-10-CM | POA: Diagnosis not present

## 2020-05-05 DIAGNOSIS — R4182 Altered mental status, unspecified: Secondary | ICD-10-CM

## 2020-05-05 DIAGNOSIS — I724 Aneurysm of artery of lower extremity: Secondary | ICD-10-CM

## 2020-05-05 DIAGNOSIS — F1120 Opioid dependence, uncomplicated: Secondary | ICD-10-CM | POA: Diagnosis present

## 2020-05-05 DIAGNOSIS — Z20822 Contact with and (suspected) exposure to covid-19: Secondary | ICD-10-CM | POA: Diagnosis present

## 2020-05-05 DIAGNOSIS — I10 Essential (primary) hypertension: Secondary | ICD-10-CM | POA: Diagnosis not present

## 2020-05-05 DIAGNOSIS — I130 Hypertensive heart and chronic kidney disease with heart failure and stage 1 through stage 4 chronic kidney disease, or unspecified chronic kidney disease: Secondary | ICD-10-CM | POA: Diagnosis present

## 2020-05-05 DIAGNOSIS — I5032 Chronic diastolic (congestive) heart failure: Secondary | ICD-10-CM | POA: Diagnosis present

## 2020-05-05 DIAGNOSIS — I729 Aneurysm of unspecified site: Secondary | ICD-10-CM | POA: Diagnosis present

## 2020-05-05 DIAGNOSIS — R278 Other lack of coordination: Secondary | ICD-10-CM | POA: Diagnosis not present

## 2020-05-05 LAB — CBC WITH DIFFERENTIAL/PLATELET
Abs Immature Granulocytes: 0.07 10*3/uL (ref 0.00–0.07)
Basophils Absolute: 0 10*3/uL (ref 0.0–0.1)
Basophils Relative: 0 %
Eosinophils Absolute: 0 10*3/uL (ref 0.0–0.5)
Eosinophils Relative: 0 %
HCT: 27.5 % — ABNORMAL LOW (ref 36.0–46.0)
Hemoglobin: 7.8 g/dL — ABNORMAL LOW (ref 12.0–15.0)
Immature Granulocytes: 1 %
Lymphocytes Relative: 6 %
Lymphs Abs: 0.9 10*3/uL (ref 0.7–4.0)
MCH: 25.9 pg — ABNORMAL LOW (ref 26.0–34.0)
MCHC: 28.4 g/dL — ABNORMAL LOW (ref 30.0–36.0)
MCV: 91.4 fL (ref 80.0–100.0)
Monocytes Absolute: 1 10*3/uL (ref 0.1–1.0)
Monocytes Relative: 7 %
Neutro Abs: 12.3 10*3/uL — ABNORMAL HIGH (ref 1.7–7.7)
Neutrophils Relative %: 86 %
Platelets: 200 10*3/uL (ref 150–400)
RBC: 3.01 MIL/uL — ABNORMAL LOW (ref 3.87–5.11)
RDW: 18.4 % — ABNORMAL HIGH (ref 11.5–15.5)
WBC: 14.3 10*3/uL — ABNORMAL HIGH (ref 4.0–10.5)
nRBC: 0 % (ref 0.0–0.2)

## 2020-05-05 LAB — GLUCOSE, CAPILLARY
Glucose-Capillary: 102 mg/dL — ABNORMAL HIGH (ref 70–99)
Glucose-Capillary: 139 mg/dL — ABNORMAL HIGH (ref 70–99)
Glucose-Capillary: 184 mg/dL — ABNORMAL HIGH (ref 70–99)
Glucose-Capillary: 200 mg/dL — ABNORMAL HIGH (ref 70–99)
Glucose-Capillary: 202 mg/dL — ABNORMAL HIGH (ref 70–99)
Glucose-Capillary: 238 mg/dL — ABNORMAL HIGH (ref 70–99)
Glucose-Capillary: 46 mg/dL — ABNORMAL LOW (ref 70–99)
Glucose-Capillary: 68 mg/dL — ABNORMAL LOW (ref 70–99)

## 2020-05-05 LAB — BASIC METABOLIC PANEL
Anion gap: 10 (ref 5–15)
BUN: 22 mg/dL (ref 8–23)
CO2: 21 mmol/L — ABNORMAL LOW (ref 22–32)
Calcium: 8.8 mg/dL — ABNORMAL LOW (ref 8.9–10.3)
Chloride: 109 mmol/L (ref 98–111)
Creatinine, Ser: 1.12 mg/dL — ABNORMAL HIGH (ref 0.44–1.00)
GFR, Estimated: 53 mL/min — ABNORMAL LOW (ref >60)
Glucose, Bld: 106 mg/dL — ABNORMAL HIGH (ref 70–99)
Potassium: 4.4 mmol/L (ref 3.5–5.1)
Sodium: 140 mmol/L (ref 135–145)

## 2020-05-05 LAB — URINALYSIS, COMPLETE (UACMP) WITH MICROSCOPIC
Bacteria, UA: NONE SEEN
Bilirubin Urine: NEGATIVE
Glucose, UA: NEGATIVE mg/dL
Hgb urine dipstick: NEGATIVE
Ketones, ur: NEGATIVE mg/dL
Leukocytes,Ua: NEGATIVE
Nitrite: NEGATIVE
Protein, ur: NEGATIVE mg/dL
Specific Gravity, Urine: 1.017 (ref 1.005–1.030)
pH: 6 (ref 5.0–8.0)

## 2020-05-05 LAB — LIPID PANEL
Cholesterol: 130 mg/dL (ref 0–200)
HDL: 30 mg/dL — ABNORMAL LOW (ref >40)
LDL Cholesterol: 68 mg/dL (ref 0–99)
Total CHOL/HDL Ratio: 4.3 RATIO
Triglycerides: 160 mg/dL — ABNORMAL HIGH (ref <150)
VLDL: 32 mg/dL (ref 0–40)

## 2020-05-05 LAB — AMMONIA: Ammonia: 45 umol/L — ABNORMAL HIGH (ref 9–35)

## 2020-05-05 LAB — HEPARIN LEVEL (UNFRACTIONATED): Heparin Unfractionated: 0.3 IU/mL (ref 0.30–0.70)

## 2020-05-05 MED ORDER — CLOPIDOGREL BISULFATE 75 MG PO TABS
75.0000 mg | ORAL_TABLET | Freq: Once | ORAL | Status: AC
  Administered 2020-05-05: 75 mg via ORAL
  Filled 2020-05-05: qty 1

## 2020-05-05 MED ORDER — HEPARIN (PORCINE) 25000 UT/250ML-% IV SOLN: 500.0000 [IU]/h | INTRAVENOUS | Status: DC

## 2020-05-05 MED ORDER — SODIUM CHLORIDE 0.9 % IV SOLN
1.0000 g | INTRAVENOUS | Status: AC
  Administered 2020-05-05 – 2020-05-07 (×3): 1 g via INTRAVENOUS
  Filled 2020-05-05: qty 10
  Filled 2020-05-05 (×2): qty 1

## 2020-05-05 NOTE — Progress Notes (Signed)
NIR.  Left MCA aneurysm s/p endovascular embolization using WEB device via right femoral approach 05/04/2020 by Dr. Estanislado Pandy.  Patient re-evaluated this PM bedside alongside Dr. Estanislado Pandy. Patient awake and alert sitting in chair watching TV. Appears more awake/alert than earlier today. Complains of chronic left neck/head pain- stable per patient. Denies dizziness, vision changes, N/V. Right femoral puncture site stable.  Alert, awake, and oriented x3. Speech and comprehension intact. PERRL bilaterally. Can spontaneously move all extremities. Fine motor and  Fine motor and coordination intact and symmetric bilaterally.  Discussed below results with patient: 1- UA unremarkable. 2- VAS [place] negative for pseudoaneurysm. 3- PT would like re-evaluation tomorrow prior to discharge.  Patient to stay in neuro ICU for additional night of observation- possible D/C tomorrow pending on status. Appreciate neurology assistance with this patient. NIR to follow.   Bea Graff Kristyl [place], PA-C 05/05/2020, 3:32 PM

## 2020-05-05 NOTE — Progress Notes (Addendum)
Rehab Admissions Coordinator Note:  Patient was screened by Cleatrice Burke for appropriateness for an Inpatient Acute Rehab Consult per therapy recs. Patient may progress rapidly to baseline. I will await another 24 hours to assess with follow up therapy before proceeding to request a rehab consult.Cleatrice Burke RN MSN 05/05/2020, 3:48 PM  I can be reached at [phone].

## 2020-05-05 NOTE — Progress Notes (Signed)
Called to micro lab to check on urine culture. (was previously sent with the U/A complete this morning.) Was told by micro staff culture would be done on sample they already have.

## 2020-05-05 NOTE — Progress Notes (Addendum)
I don't see lab results from today. RN confirms they were drawn and sent down at 0430. Called Lab. No answer received. [facility] states "no pending orders" so I will assume labs are in progress.

## 2020-05-05 NOTE — Progress Notes (Signed)
ANTICOAGULATION CONSULT NOTE   Pharmacy Consult for Heparin Indication: Post neuro IR procedure  Allergies  Allergen Reactions  . Contrast Media [Iodinated Diagnostic Agents]     Renal hypertension per physician    Patient Measurements: Height: '5\' 5"'  (165.1 cm) Weight: 81.8 kg (180 lb 5.4 oz) IBW/kg (Calculated) : 57 Heparin Dosing Weight: 74 kg  Vital Signs: Temp: 97.8 F (36.6 C) (12/21 0000) Temp Source: Oral (12/21 0000) BP: 111/45 (12/20 2300) Pulse Rate: 60 (12/20 2345)  Labs: Recent Labs    05/04/20 1645 05/04/20 2326  HGB 8.8*  --   HCT 29.7*  --   PLT 229  --   HEPARINUNFRC  --  0.30  CREATININE 1.26*  --     Estimated Creatinine Clearance: 43.9 mL/min (A) (by C-G formula based on SCr of 1.26 mg/dL (H)).   Medical History: Past Medical History:  Diagnosis Date  . Acute renal failure ([place])   . Anemia   . Arthritis   . COPD (chronic obstructive pulmonary disease) ([place])   . Diabetes mellitus without complication ([place])    type II   . GERD (gastroesophageal reflux disease)   . Hypercalcemia   . Hyperlipidemia   . Hypertension   . Pneumonia   . Renal disorder   . Single kidney   . Stroke [facility])    July 2021  . Thrombosis   . Tobacco abuse     Medications:  Medications Prior to Admission  Medication Sig Dispense Refill Last Dose  . allopurinol (ZYLOPRIM) 100 MG tablet Take 100 mg by mouth at bedtime.   05/03/2020 at Unknown time  . amLODipine (NORVASC) 10 MG tablet Take 10 mg by mouth daily.   05/03/2020 at Unknown time  . APPLE CIDER VINEGAR PO Take 1 tablet by mouth daily.   05/03/2020 at Unknown time  . aspirin EC 81 MG EC tablet Take 1 tablet (81 mg total) by mouth daily. Swallow whole. 30 tablet 11 05/03/2020 at Unknown time  . atorvastatin (LIPITOR) 80 MG tablet Take 1 tablet (80 mg total) by mouth daily. 30 tablet 11 05/03/2020 at Unknown time  . baclofen (LIORESAL) 10 MG tablet Take 10 mg by mouth 3 (three) times daily as needed for muscle  spasms.   05/03/2020 at Unknown time  . buprenorphine (BUTRANS) 20 MCG/HR PTWK Place 1 patch onto the skin every Monday.    Past Week at Unknown time  . clopidogrel (PLAVIX) 75 MG tablet Take 75 mg by mouth daily.   05/04/2020 at 0400  . Dulaglutide (TRULICITY) 1.5 ZE/0.9QZ SOPN Inject 1.5 mg into the skin every Monday.    Past Week at Unknown time  . ezetimibe (ZETIA) 10 MG tablet Take 10 mg by mouth daily.   05/03/2020 at Unknown time  . gabapentin (NEURONTIN) 600 MG tablet Take 600 mg by mouth 4 (four) times daily.    05/04/2020 at [place]  . insulin glargine, 1 Unit Dial, (TOUJEO SOLOSTAR) 300 UNIT/ML Solostar Pen Inject 20 Units into the skin at bedtime.    05/03/2020 at Unknown time  . Ipratropium-Albuterol (COMBIVENT) 20-100 MCG/ACT AERS respimat Inhale 1 puff into the lungs every 6 (six) hours as needed for wheezing or shortness of breath. 1 Inhaler 0 05/04/2020 at 0400  . levothyroxine (SYNTHROID) 75 MCG tablet Take 75 mcg by mouth daily.   05/04/2020 at 0400  . losartan (COZAAR) 25 MG tablet Take 1 tablet (25 mg total) by mouth daily. 30 tablet 11 05/03/2020 at Unknown time  .  metoprolol (LOPRESSOR) 50 MG tablet Take 50 mg by mouth 2 (two) times daily.    05/03/2020 at Unknown time  . Multiple Vitamin (MULTIVITAMIN WITH MINERALS) TABS tablet Take 1 tablet by mouth daily.   05/03/2020 at Unknown time  . oxyCODONE-acetaminophen (PERCOCET) 10-325 MG tablet Take 1 tablet by mouth every 6 (six) hours as needed (breakthrough pain).    05/03/2020 at Unknown time  . pregabalin (LYRICA) 25 MG capsule Take 25 mg by mouth 2 (two) times daily.    05/03/2020 at Unknown time  . sennosides-docusate sodium (SENOKOT-S) 8.6-50 MG tablet Take 1 tablet by mouth at bedtime.   05/03/2020 at Unknown time  . Vitamin D, Ergocalciferol, (DRISDOL) 1.25 MG (50000 UNIT) CAPS capsule Take 50,000 Units by mouth every Monday.    Past Week at Unknown time  . XTAMPZA ER 27 MG C12A Take 27 mg by mouth 2 (two) times daily.     05/03/2020 at Unknown time  . feeding supplement, ENSURE ENLIVE, (ENSURE ENLIVE) LIQD Take 237 mLs by mouth 2 (two) times daily between meals. 237 mL 12 Unknown at Unknown time  . furosemide (LASIX) 40 MG tablet Take 1 tablet (40 mg total) by mouth daily as needed. (Patient taking differently: Take 40 mg by mouth daily as needed for fluid.) 45 tablet 3 More than a month at Unknown time  . lidocaine-prilocaine (EMLA) cream Apply 1 application topically as needed. (Patient taking differently: Apply 1 application topically daily as needed (port access).) 30 g 1   . NARCAN 4 MG/0.1ML LIQD nasal spray kit Place 1 spray into the nose as needed for opioid reversal.     . pantoprazole (PROTONIX) 40 MG tablet Take 40 mg by mouth every morning.       Assessment: 36 YOF s/p embolization of MCA aneurysm on IV heparin post procedure per neuro-IR protocol.   Currently on IV heparin at 500 units/hr. Of note, patient developed stroke symptoms post procedure but head CT showed no ICH. Per IR, okay to continue IV heparin in the absence of a bleed.   12/21 AM update:  Heparin level just above goal  Goal of Therapy:  Heparin level 0.1-0.25 units/ml Monitor platelets by anticoagulation protocol: Yes   Plan:  -Dec heparin to 500 units/hr -IV heparin off 12/21 0700  Narda Bonds, PharmD, BCPS Clinical Pharmacist Phone: [phone]

## 2020-05-05 NOTE — Progress Notes (Signed)
Inpatient Diabetes Program Recommendations  AACE/ADA: New Consensus Statement on Inpatient Glycemic Control (2015)  Target Ranges:  Prepandial:   less than 140 mg/dL      Peak postprandial:   less than 180 mg/dL (1-2 hours)      Critically ill patients:  140 - 180 mg/dL   Lab Results  Component Value Date   GLUCAP 68 (L) 05/05/2020   HGBA1C 4.7 (L) 05/04/2020    Review of Glycemic Control Results for DELA, SWEENY (MRN [id number]) as of 05/05/2020 13:12  Ref. Range 05/04/2020 16:43 05/04/2020 23:57 05/05/2020 04:06 05/05/2020 07:53 05/05/2020 07:55  Glucose-Capillary Latest Ref Range: 70 - 99 mg/dL 236 (H) 184 (H) 139 (H) 46 (L) 68 (L)   Diabetes history:  DM2 Outpatient Diabetes medications:  Trulicity1.5 mg Q Monday Current orders for Inpatient glycemic control:  Novolog 0-15 units Q4H  Inpatient Diabetes Program Recommendations:     Novolog 0-6 units Q4H  Will continue to follow while inpatient.  Thank you, Reche Dixon, RN, BSN Diabetes Coordinator Inpatient Diabetes Program [phone] (team pager from 8a-5p)

## 2020-05-05 NOTE — Evaluation (Signed)
Physical Therapy Evaluation Patient Details Name: Bethany [name] MRN: [id number] DOB: 07/26/1950 Today's Date: 05/05/2020   History of Present Illness  69 yo female with L MCA aneurysm s/p endovascular embolization using WEB device via right femoral approach 05/04/2020. post-operatively, pt with RUE/LE weakness and aphasia, resolving since, no acute CVA appreciated on CT. 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  Clinical Impression   Pt presents with generalized weakness, impaired standing balance, decreased safety awareness during mobility, impaired gait, and decreased activity tolerance vs baseline. Pt to benefit from acute PT to address deficits. Pt ambulated short hallway distance with use of IV pole and min +2 assist to steady, pt limited in distance by unsteadiness and posterior truncal extension. At baseline, pt is independent and lives alone, per pt her daughter lives in the apt across from her and can help as needed. At this time, recommending CIR but hoping pt will progress closer to baseline next session, will reassess d/c plan tomorrow. PT to progress mobility as tolerated, and will continue to follow acutely.      Follow Up Recommendations CIR    Equipment Recommendations  None recommended by PT    Recommendations for Other Services       Precautions / Restrictions Precautions Precautions: Fall Restrictions Weight Bearing Restrictions: No      Mobility  Bed Mobility Overal bed mobility: Needs Assistance Bed Mobility: Supine to Sit     Supine to sit: Min assist;HOB elevated     General bed mobility comments: Min assist for trunk elevation, increased time.    Transfers Overall transfer level: Needs assistance Equipment used: 2 person hand held assist Transfers: Sit to/from Stand Sit to Stand: Min assist;+2 safety/equipment         General transfer comment: Min +2 for power up, steadying, and pivot to [facility] at bedside.  STS x2, from EOB and BSC.  Ambulation/Gait Ambulation/Gait assistance: Min assist;+2 safety/equipment (chair follow) Gait Distance (Feet): 25 Feet Assistive device: IV Pole;1 person hand held assist Gait Pattern/deviations: Step-through pattern;Decreased stride length;Trunk flexed Gait velocity: decr   General Gait Details: min assist to steady, manage lines/leads. As pt fatigued, pt demonstrating posterior leaning and requiring seated rest.  Stairs            Wheelchair Mobility    Modified Rankin (Stroke Patients Only)       Balance Overall balance assessment: Needs assistance Sitting-balance support: No upper extremity supported Sitting balance-Leahy Scale: Fair     Standing balance support: Single extremity supported;During functional activity Standing balance-Leahy Scale: Poor Standing balance comment: requiring SL support at this time                             Pertinent Vitals/Pain Pain Assessment: Faces Faces Pain Scale: Hurts a little bit Pain Location: urination Pain Descriptors / Indicators: Grimacing;Discomfort Pain Intervention(s): Monitored during session;Repositioned    Home Living Family/patient expects to be discharged to:: Private residence Living Arrangements: Alone Available Help at Discharge: Family (daughter lives across the hall) Type of Home: Apartment Home Access: Level entry     Home Layout: One level Home Equipment: Kasandra Knudsen - single point      Prior Function Level of Independence: Independent               Hand Dominance   Dominant Hand: Right    Extremity/Trunk Assessment   Upper Extremity Assessment Upper Extremity Assessment: Defer  to OT evaluation    Lower Extremity Assessment Lower Extremity Assessment: Generalized weakness    Cervical / Trunk Assessment Cervical / Trunk Assessment: Normal  Communication   Communication: No difficulties  Cognition Arousal/Alertness: Awake/alert Behavior During  Therapy: WFL for tasks assessed/performed Overall Cognitive Status: No family/caregiver present to determine baseline cognitive functioning                                 General Comments: A&Ox4, but talks about her hallucinations from yesterday as if they actually happened i.e. "there were dogs coming out of that door over there, this was a dog hospital"      General Comments General comments (skin integrity, edema, etc.): vss    Exercises     Assessment/Plan    PT Assessment Patient needs continued PT services  PT Problem List Decreased strength;Decreased mobility;Decreased activity tolerance;Decreased balance;Decreased knowledge of use of DME;Pain;Decreased safety awareness       PT Treatment Interventions DME instruction;Therapeutic activities;Gait training;Therapeutic exercise;Patient/family education;Balance training;Functional mobility training;Neuromuscular re-education    PT Goals (Current goals can be found in the Care Plan section)  Acute Rehab PT Goals Patient Stated Goal: go home PT Goal Formulation: With patient Time For Goal Achievement: 05/19/20 Potential to Achieve Goals: Good    Frequency Min 4X/week   Barriers to discharge        Co-evaluation PT/OT/SLP Co-Evaluation/Treatment: Yes Reason for Co-Treatment: For patient/therapist safety;To address functional/ADL transfers           AM-PAC PT "6 Clicks" Mobility  Outcome Measure Help needed turning from your back to your side while in a flat bed without using bedrails?: A Little Help needed moving from lying on your back to sitting on the side of a flat bed without using bedrails?: A Little Help needed moving to and from a bed to a chair (including a wheelchair)?: A Little Help needed standing up from a chair using your arms (e.g., wheelchair or bedside chair)?: A Little Help needed to walk in hospital room?: A Little Help needed climbing 3-5 steps with a railing? : A Lot 6 Click  Score: 17    End of Session Equipment Utilized During Treatment: Gait belt Activity Tolerance: Patient limited by fatigue Patient left: in chair;with chair alarm set;with call bell/phone within reach Nurse Communication: Mobility status PT Visit Diagnosis: Other abnormalities of gait and mobility (R26.89);Muscle weakness (generalized) (M62.81)    Time: 8786-7672 PT Time Calculation (min) (ACUTE ONLY): 25 min   Charges:   PT Evaluation $PT Eval Low Complexity: 1 Low         Doyle Tegethoff S, PT Acute Rehabilitation Services Pager [phone]  Office [phone]  Liboria Putnam E Christain [place] 05/05/2020, 1:23 PM

## 2020-05-05 NOTE — Evaluation (Signed)
Occupational Therapy Evaluation Patient Details Name: Bethany [name] MRN: [id number] DOB: Oct 13, 1950 Today's Date: 05/05/2020    History of Present Illness 69 yo female with L MCA aneurysm s/p endovascular embolization using WEB device via right femoral approach 05/04/2020. post-operatively, pt with RUE/LE weakness and aphasia, resolving since, no acute CVA appreciated on CT. 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   Clinical Impression   This 69 y/o female presents with the above. PTA pt living alone and reports independent with ADL and mobility tasks. Today pt performing room and short distance mobility into hallway initially with +2 minA progressed to minA (+2 for safety/chair follow). Pt with intermittent confusion and endorses having had hallucinations on day prior. She also presents with increased fatigue and increasingly more unsteady when fatigued. VSS throughout. Pt reports her daughter lives across the hallway from her apartment and is available to assist PRN. She will benefit from continued acute OT services and given current status recommend follow up CIR level therapies at time of discharge to maximize her safety and independence with ADL and mobility.     Follow Up Recommendations  CIR    Equipment Recommendations  3 in 1 bedside commode (for use in shower)    Recommendations for Other Services Rehab consult     Precautions / Restrictions Precautions Precautions: Fall Restrictions Weight Bearing Restrictions: No      Mobility Bed Mobility Overal bed mobility: Needs Assistance Bed Mobility: Supine to Sit     Supine to sit: Min assist;HOB elevated     General bed mobility comments: Min assist for trunk elevation, increased time.    Transfers Overall transfer level: Needs assistance Equipment used: 2 person hand held assist Transfers: Sit to/from Stand Sit to Stand: Min assist;+2 safety/equipment         General  transfer comment: Min +2 for power up, steadying, and pivot to [facility] at bedside. STS x2, from EOB and BSC.    Balance Overall balance assessment: Needs assistance Sitting-balance support: No upper extremity supported Sitting balance-Leahy Scale: Fair     Standing balance support: Single extremity supported;During functional activity Standing balance-Leahy Scale: Poor Standing balance comment: requiring SL support at this time                           ADL either performed or assessed with clinical judgement   ADL Overall ADL's : Needs assistance/impaired Eating/Feeding: Set up;Sitting   Grooming: Set up;Sitting   Upper Body Bathing: Min guard;Sitting   Lower Body Bathing: Moderate assistance;Sit to/from stand;+2 for safety/equipment   Upper Body Dressing : Sitting;Min guard   Lower Body Dressing: Moderate assistance;+2 for safety/equipment;Sit to/from stand Lower Body Dressing Details (indicate cue type and reason): assist for socks today Toilet Transfer: Minimal assistance;+2 for physical assistance;+2 for safety/equipment;Stand-pivot;BSC Toilet Transfer Details (indicate cue type and reason): pt ambulatory a short distance in room/hallway post transfer to [place] and Hygiene: Minimal assistance;+2 for safety/equipment;Sit to/from stand Toileting - Clothing Manipulation Details (indicate cue type and reason): pt performing pericare in standing with minA for balance     Functional mobility during ADLs: Minimal assistance;+2 for safety/equipment (IV poled)       Vision   Additional Comments: will need to further assess     Perception     Praxis      Pertinent Vitals/Pain Pain Assessment: Faces Faces Pain Scale: Hurts a little bit Pain Location: urination  Pain Descriptors / Indicators: Grimacing;Discomfort Pain Intervention(s): Monitored during session;Repositioned     Hand Dominance Right   Extremity/Trunk Assessment Upper  Extremity Assessment Upper Extremity Assessment: Generalized weakness;RUE deficits/detail;LUE deficits/detail RUE Deficits / Details: decreased fine motor/coordination noted, but is functional given increased time LUE Deficits / Details: decreased fine motor/coordination noted, but is functional given increased time   Lower Extremity Assessment Lower Extremity Assessment: Defer to PT evaluation   Cervical / Trunk Assessment Cervical / Trunk Assessment: Normal   Communication Communication Communication: No difficulties   Cognition Arousal/Alertness: Awake/alert Behavior During Therapy: WFL for tasks assessed/performed Overall Cognitive Status: No family/caregiver present to determine baseline cognitive functioning                                 General Comments: A&Ox4, but talks about her hallucinations from yesterday as if they actually happened i.e. "there were dogs coming out of that door over there, this was a dog hospital". pt occasionally slow to respond to therapist's questions/instruction   General Comments  VSS    Exercises     Shoulder Instructions      Home Living Family/patient expects to be discharged to:: Private residence Living Arrangements: Alone Available Help at Discharge: Family (dtr lives across the hall) Type of Home: Apartment Home Access: Level entry     Home Layout: One level     Bathroom Shower/Tub: Teacher, early years/pre: Standard     Home Equipment: Cane - single point          Prior Functioning/Environment Level of Independence: Independent                 OT Problem List: Decreased strength;Decreased range of motion;Decreased activity tolerance;Impaired balance (sitting and/or standing);Decreased cognition;Decreased safety awareness;Decreased coordination      OT Treatment/Interventions: Self-care/ADL training;Therapeutic exercise;Energy conservation;Therapeutic activities;Cognitive  remediation/compensation;Patient/family education;Balance training;Visual/perceptual remediation/compensation;DME and/or AE instruction    OT Goals(Current goals can be found in the care plan section) Acute Rehab OT Goals Patient Stated Goal: go home OT Goal Formulation: With patient Time For Goal Achievement: 05/19/20 Potential to Achieve Goals: Good  OT Frequency: Min 2X/week   Barriers to D/C:            Co-evaluation PT/OT/SLP Co-Evaluation/Treatment: Yes Reason for Co-Treatment: For patient/therapist safety;To address functional/ADL transfers   OT goals addressed during session: ADL's and self-care      AM-PAC OT "6 Clicks" Daily Activity     Outcome Measure Help from another person eating meals?: A Little Help from another person taking care of personal grooming?: A Little Help from another person toileting, which includes using toliet, bedpan, or urinal?: A Little Help from another person bathing (including washing, rinsing, drying)?: A Little Help from another person to put on and taking off regular upper body clothing?: A Little Help from another person to put on and taking off regular lower body clothing?: A Lot 6 Click Score: 17   End of Session Equipment Utilized During Treatment: Gait belt Nurse Communication: Mobility status  Activity Tolerance: Patient tolerated treatment well Patient left: in chair;with call bell/phone within reach;with chair alarm set  OT Visit Diagnosis: Other abnormalities of gait and mobility (R26.89);Other symptoms and signs involving the nervous system (R29.898);Other symptoms and signs involving cognitive function;Unsteadiness on feet (R26.81)                Time: 7408-1448 OT Time Calculation (min): 24 min Charges:  OT General Charges $OT Visit: 1 Visit OT Evaluation $OT Eval Moderate Complexity: 1 Mod  Marcy Siren, OT Acute Rehabilitation Services Pager [phone] Office [phone]   Bethany [name] 05/05/2020,  4:58 PM

## 2020-05-05 NOTE — Progress Notes (Addendum)
STROKE TEAM PROGRESS NOTE   INTERVAL HISTORY No family at bedside. Patient in bed asleep. Easily arouses to voice.  Her neurological exam is much improved patient is alert and communicative and able to speaks sentences.  MRI scan of the brain is negative for any acute infarcts EEG shows 3 to 6 Hz teeter delta slowing in the left temporal region without definite epileptiform activity.  Vitals:   05/05/20 0700 05/05/20 0715 05/05/20 0800 05/05/20 0900  BP: (!) 122/53  (!) 148/75   Pulse: (!) 53 (!) 54 72 (!) 57  Resp: 15 15 14 18   Temp:   97.8 F (36.6 C)   TempSrc:   Oral   SpO2: 100% 99% 100% 98%  Weight:      Height:       CBC:  Recent Labs  Lab 05/04/20 1645 05/05/20 0436  WBC 14.0* 14.3*  NEUTROABS 13.1* 12.3*  HGB 8.8* 7.8*  HCT 29.7* 27.5*  MCV 87.4 91.4  PLT 229 850   Basic Metabolic Panel:  Recent Labs  Lab 05/04/20 1645 05/05/20 0436  NA 137 140  K 5.0 4.4  CL 108 109  CO2 21* 21*  GLUCOSE 242* 106*  BUN 27* 22  CREATININE 1.26* 1.12*  CALCIUM 8.7* 8.8*   Lipid Panel: No results for input(s): CHOL, TRIG, HDL, CHOLHDL, VLDL, LDLCALC in the last 168 hours. HgbA1c:  Recent Labs  Lab 05/04/20 1658  HGBA1C 4.7*     IMAGING past 24 hours MR BRAIN WO CONTRAST  Result Date: 05/05/2020 CLINICAL DATA:  Initial evaluation for speech abnormality following recent aneurysm repair. EXAM: MRI HEAD WITHOUT CONTRAST TECHNIQUE: Multiplanar, multiecho pulse sequences of the brain and surrounding structures were obtained without intravenous contrast. COMPARISON:  Previous CT from 05/04/2020 and MRI from 11/19/2019. FINDINGS: Brain: Cerebral volume within normal limits for age. Mild scattered chronic microvascular ischemic changes noted involving the supratentorial cerebral white matter and pons. Involving encephalomalacia of with laminar necrosis seen at the posterior left temporal region related to the previously identified infarct. Associated susceptibility artifact  related to laminar necrosis and/or chronic hemosiderin staining. No other abnormal foci of restricted diffusion to suggest acute or subacute ischemia. Gray-white matter differentiation otherwise maintained. No other evidence for acute or chronic intracranial hemorrhage. No mass lesion, midline shift or mass effect. No hydrocephalus or extra-axial fluid collection. Pituitary gland suprasellar region normal. Midline structures intact. Vascular: Major intracranial vascular flow voids are maintained. Skull and upper cervical spine: Craniocervical junction within normal limits. Diffusely decreased T1 signal intensity seen throughout the visualized bone marrow, nonspecific, but most commonly related to anemia, smoking, or obesity. No scalp soft tissue abnormality. Sinuses/Orbits: Patient status post bilateral ocular lens replacement. Globes and orbital soft tissues demonstrate no acute finding. Paranasal sinuses are largely clear. Right mastoid effusion noted. Visualized nasopharynx within normal limits. Other: None. IMPRESSION: 1. No acute intracranial infarct or other abnormality. 2. Evolving encephalomalacia with laminar necrosis involving the posterior left temporal region related to previously identified left MCA territory infarct. 3. Underlying mild chronic microvascular ischemic disease. 4. Right mastoid effusion. Electronically Signed   By: Jeannine Boga M.D.   On: 05/05/2020 01:36   EEG adult  Result Date: 05/05/2020 Lora Havens, MD     05/05/2020  9:26 AM Patient Name: Bethany [name] MRN: [id number] Epilepsy Attending: Lora Havens Referring Physician/Provider: Dr Antony Contras Date: 05/04/2020 Duration: 25.07 mins Patient history: 69 year old lady with known prior history of left MCA infarct with mild residual aphasia and  right-sided weakness with sudden onset of altered mental status following elective left middle cerebral artery bifurcation aneurysm successful coiling with exam consistent  with aphasia and mild right-sided weakness and encephalopathy. EEG to evaluate for seizure Level of alertness: Awake AEDs during EEG study: GBP, PGB Technical aspects: This EEG study was done with scalp electrodes positioned according to the 10-20 International system of electrode placement. Electrical activity was acquired at a sampling rate of 500Hz  and reviewed with a high frequency filter of 70Hz  and a low frequency filter of 1Hz . EEG data were recorded continuously and digitally stored. Description: The posterior dominant rhythm consists of 8 Hz activity of moderate voltage (25-35 uV) seen predominantly in posterior head regions, symmetric and reactive to eye opening and eye closing. EEG showed continuous left temporal 3 to 6 Hz theta-delta slowing as well as intermittent generalized 3 to 6 Hz theta-delta slowing. Hyperventilation and photic stimulation were not performed.   ABNORMALITY -Continuous slow, left temporal IMPRESSION: This study is suggestive of cortical dysfunction in left temporal region secondary to underlying structural abnormality/stroke. Additionally there is mild diffuse encephalopathy, nonspecific etiology. No seizures or epileptiform discharges were seen throughout the recording. Priyanka O Yadav   VAS [place] GROIN PSEUDOANEURYSM  Result Date: 05/05/2020  ARTERIAL PSEUDOANEURYSM  Exam: Right groin Indications: Patient complains of groin pain. History: Status post left carotid arterigram follow by emblomization left tMCA aneurysm. Comparison Study: No prior. Performing Technologist: Oda Cogan RDMS, RVT  Examination Guidelines: A complete evaluation includes B-mode imaging, spectral Doppler, color Doppler, and power Doppler as needed of all accessible portions of each vessel. Bilateral testing is considered an integral part of a complete examination. Limited examinations for reoccurring indications may be performed as noted. +------------+----------+--------+------+----------+ Right  DuplexPSV (cm/s)WaveformPlaqueComment(s) +------------+----------+--------+------+----------+ CFA            116                    Patent   +------------+----------+--------+------+----------+ Prox SFA       188                    Patent   +------------+----------+--------+------+----------+ Right Vein comments:Patent right common femoral vein.  Summary: No evidence of right groin pseudoaneurysm or AVF.    --------------------------------------------------------------------------------    Preliminary    CT HEAD CODE STROKE WO CONTRAST  Result Date: 05/04/2020 CLINICAL DATA:  Code stroke.  Stroke follow-up. EXAM: CT HEAD WITHOUT CONTRAST TECHNIQUE: Contiguous axial images were obtained from the base of the skull through the vertex without intravenous contrast. COMPARISON:  CT head and MRI from July 6, 21. FINDINGS: Brain: Evolving encephalomalacia in the lateral left temporal lobe, in the region of the previously characterized infarct. Decreased edema and mass effect. No evidence of new/acute large vascular territory infarct. No acute hemorrhage. Vascular: No hyperdense vessel identified. Post-surgical change related to left MCA aneurysm embolization with a web flow disrupter device. Skull: No acute fracture. Sinuses/Orbits: Mild paranasal sinus mucosal thickening without air-fluid levels. Unremarkable orbits. Other: No mastoid effusions. ASPECTS [facility] Stroke Program Early CT Score) total score (0-10 with 10 being normal): 10 IMPRESSION: 1. No evidence of acute intracranial abnormality. ASPECTS is 10. 2. Evolving encephalomalacia in the lateral left temporal lobe, in the region of the previously characterized infarct. 3. Post-surgical change related to left MCA aneurysm embolization with a web flow disrupter device. Code stroke imaging results were communicated on 05/04/2020 at 2:22 pm to provider Dr. Leonie Man via telephone, who verbally acknowledged these results.  Electronically Signed   By:  Margaretha Sheffield MD   On: 05/04/2020 14:28   CT ANGIO HEAD CODE STROKE  Result Date: 05/04/2020 CLINICAL DATA:  Stroke suspected. EXAM: CT ANGIOGRAPHY HEAD AND NECK TECHNIQUE: Multidetector CT imaging of the head and neck was performed using the standard protocol during bolus administration of intravenous contrast. Multiplanar CT image reconstructions and MIPs were obtained to evaluate the vascular anatomy. Carotid stenosis measurements (when applicable) are obtained utilizing NASCET criteria, using the distal internal carotid diameter as the denominator. CONTRAST:  71mL OMNIPAQUE IOHEXOL 350 MG/ML SOLN COMPARISON:  MRA November 20, 2019. CT chest June 15 21. FINDINGS: CTA NECK FINDINGS Aortic arch: Great vessel origins are patent. Right carotid system: No evidence of dissection, stenosis (50% or greater) or occlusion. Atherosclerosis at the bifurcation. Left carotid system: No evidence of dissection, stenosis (50% or greater) or occlusion. Atherosclerosis at the bifurcation. Vertebral arteries: Codominant. No evidence of dissection, stenosis (50% or greater) or occlusion. Skeleton: No acute findings. Other neck: No mass or suspicious adenopathy. Upper chest: Small bilateral pleural effusions. Patchy opacities in the lung apices may represent aspiration and/or pneumonia. Review of the MIP images confirms the above findings CTA HEAD FINDINGS Anterior circulation: No significant proximal stenosis or large vessel occlusion. Status post treatment of a left MCA aneurysm with web flow disrupted device. There is some expected flow within the aneurysm sac. Approximately 2 mm left posterior communicating artery aneurysm (see series 7, image 109). Distal left M2/M3 MCA branch stenosis was better characterized on the prior MRA. Posterior circulation: No significant stenosis, proximal occlusion, aneurysm, or vascular malformation. Mild stenosis of the basilar artery. Venous sinuses: As permitted by contrast timing, patent.  Review of the MIP images confirms the above findings IMPRESSION: 1. No large vessel occlusion or new hemodynamically significant proximal stenosis in the head or neck. 2. Status post treatment of a left MCA aneurysm with web flow disrupted device. There is some expected flow within the aneurysm sac. 3. 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 4. Small bilateral pleural effusions. Patchy opacities in the lung apices may represent aspiration and/or pneumonia. Additionally, there appears to be a new 1 cm subpleural nodular opacity in the imaged lateral left upper lobe that is new since CT chest from October 29, 2019. While this most likely represents an infectious/inflammatory process, recommend follow-chest CT when the patient is able to exclude pulmonary nodule. Critical findings discussed with Dr. Marjory Lies at 2:24 PM and Dr. Leonie Man at 2:45 PM via telephone. Lung findings discussed with Dr. Leonie Man at 3:00 PM. Electronically Signed   By: Margaretha Sheffield MD   On: 05/04/2020 15:05   CT ANGIO NECK CODE STROKE  Result Date: 05/04/2020 CLINICAL DATA:  Stroke suspected. EXAM: CT ANGIOGRAPHY HEAD AND NECK TECHNIQUE: Multidetector CT imaging of the head and neck was performed using the standard protocol during bolus administration of intravenous contrast. Multiplanar CT image reconstructions and MIPs were obtained to evaluate the vascular anatomy. Carotid stenosis measurements (when applicable) are obtained utilizing NASCET criteria, using the distal internal carotid diameter as the denominator. CONTRAST:  21mL OMNIPAQUE IOHEXOL 350 MG/ML SOLN COMPARISON:  MRA November 20, 2019. CT chest June 15 21. FINDINGS: CTA NECK FINDINGS Aortic arch: Great vessel origins are patent. Right carotid system: No evidence of dissection, stenosis (50% or greater) or occlusion. Atherosclerosis at the bifurcation. Left carotid system: No  evidence of dissection, stenosis (50% or greater) or occlusion.  Atherosclerosis at the bifurcation. Vertebral arteries: Codominant. No evidence of dissection, stenosis (50% or greater) or occlusion. Skeleton: No acute findings. Other neck: No mass or suspicious adenopathy. Upper chest: Small bilateral pleural effusions. Patchy opacities in the lung apices may represent aspiration and/or pneumonia. Review of the MIP images confirms the above findings CTA HEAD FINDINGS Anterior circulation: No significant proximal stenosis or large vessel occlusion. Status post treatment of a left MCA aneurysm with web flow disrupted device. There is some expected flow within the aneurysm sac. Approximately 2 mm left posterior communicating artery aneurysm (see series 7, image 109). Distal left M2/M3 MCA branch stenosis was better characterized on the prior MRA. Posterior circulation: No significant stenosis, proximal occlusion, aneurysm, or vascular malformation. Mild stenosis of the basilar artery. Venous sinuses: As permitted by contrast timing, patent. Review of the MIP images confirms the above findings IMPRESSION: 1. No large vessel occlusion or new hemodynamically significant proximal stenosis in the head or neck. 2. Status post treatment of a left MCA aneurysm with web flow disrupted device. There is some expected flow within the aneurysm sac. 3. 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 4. Small bilateral pleural effusions. Patchy opacities in the lung apices may represent aspiration and/or pneumonia. Additionally, there appears to be a new 1 cm subpleural nodular opacity in the imaged lateral left upper lobe that is new since CT chest from October 29, 2019. While this most likely represents an infectious/inflammatory process, recommend follow-chest CT when the patient is able to exclude pulmonary nodule. Critical findings  discussed with Dr. Marjory Lies at 2:24 PM and Dr. Leonie Man at 2:45 PM via telephone. Lung findings discussed with Dr. Leonie Man at 3:00 PM. Electronically Signed   By: Margaretha Sheffield MD   On: 05/04/2020 15:05    PHYSICAL EXAM Pleasant middle-aged African-American lady not in distress. . Afebrile. Head is nontraumatic. Neck is supple without bruit.    Cardiac exam no murmur or gallop. Lungs are clear to auscultation. Distal pulses are well felt.  Neurological Exam: She is awake alert oriented to time place and person.  Speech is clear without aphasia apraxia or dysarthria.  She is able to name repeat and comprehend quite well.  Extraocular movements are full range without nystagmus.  Face is symmetric without weakness.  Tongue midline.  Motor system exam shows no upper or lower extremity drift diminished fine finger movements on the right and orbits left over right upper extremity.  Trace weakness of right grip right hip flexors only.  Sensation is intact.  Gait not tested. ASSESSMENT/PLAN Bethany [name] is a 69 y.o. female with history of CVA, tobacco abuse, HTN, HLD, DM  presented with  AMS, aphasia, right side weakness s/p aneurysm embolization.    Left hemispheric TIA: Versus transient encephalopathy following anesthesia versus seizure s/p anuerysm embolization  Code Stroke CT head 1. No evidence of acute intracranial abnormality. ASPECTS is 10. 2. Evolving encephalomalacia in the lateral left temporal lobe, in the region of the previously characterized infarct. 3. Post-surgical change related to left MCA aneurysm embolization with a web flow disrupter device.  CTA head & neck 1. No large vessel occlusion or new hemodynamically significant proximal stenosis in the head or neck. 2. Status post treatment of a left MCA aneurysm with web flow disrupted device. There is some expected flow within the aneurysm Sac. 3. Approximately 2 mm left internal carotid artery posterior communicating artery  region aneurysm,  better characterized on recent catheter arteriogram. 4. Small bilateral pleural effusions. Patchy opacities in the lung apices may represent aspiration and/or pneumonia.  MRI  1. No acute intracranial infarct or other abnormality. 2. Evolving encephalomalacia with laminar necrosis involving the posterior left temporal region related to previously identified left MCA territory infarct.3. Underlying mild chronic microvascular ischemic disease. 4. Right mastoid effusion.  EEG:  No seizures or epileptiform discharges were seen throughout but focal 3 to 6 hours theta delta slowing is noted in the left temporal region.  LDL 91  HgbA1c 4.7  VTE prophylaxis - SCD's .    Diet   Diet Heart Room service appropriate? Yes with Assist; Fluid consistency: Thin     aspirin 81 mg daily and clopidogrel 75 mg daily prior to admission, now on aspirin 81 mg daily and clopidogrel 75 mg daily.   Therapy recommendations:  pending  Disposition:  pending  Hypertension  Home meds:  Amlodipine 10 mg daily, losartan 25 mg daily  Stable . Permissive hypertension (OK if < 220/120) but gradually normalize in 5-7 days . Long-term BP goal normotensive  Hyperlipidemia  Home meds:  Atorvastatin 80 mg daily, resumed in hospital  LDL 91, goal < 70  Continue statin at discharge  Diabetes type II Controlled  Home meds:  Trulicity, Zetia, Toujeo  HgbA1c 4.7, goal < 7.0  CBGs . Recent Labs    05/05/20 0406 05/05/20 0753 05/05/20 0755  GLUCAP 139* 46* 68*      SSI  Other Stroke Risk Factors  Advanced Age >/= 76   Cigarette smoker advised to stop smoking  ETOH use, alcohol level <10, advised to drink no more than 1 drink a day  Obesity, Body mass index is 30.01 kg/m., BMI >/= 30 associated with increased stroke risk, recommend weight loss, diet and exercise as appropriate   Hx stroke/TIA   Hospital day # 0  Laurey Morale, MSN, NP-C [facility]  Hospitalist [phone] Patient had transient altered mental status with speech difficulties and worsening of right hemiparesis following anesthesia and aneurysm coiling unclear if this represented a TIA versus encephalopathy but appears to have resolved.  MRI scan is negative for acute infarct and EEG suggest focal left temporal slowing but no definite epileptiform activity noted continue aspirin and Plavix following aneurysm repair and mobilize out of bed with therapy consults.  Stroke team will sign off.  Kindly call for questions.  Greater than 50% time during this 35-minute visit was spent on counseling and coordination of care about her neurological presentation discussion of test results discussion with Dr. Estanislado Pandy and answering questions. Antony Contras, MD To contact Stroke Continuity provider, please refer to [email or web address] After hours, contact General Neurology

## 2020-05-05 NOTE — Procedures (Signed)
Patient Name: Bethany [name]  MRN: [id number]  Epilepsy Attending: Lora Havens  Referring Physician/Provider: Dr Antony Contras Date: 05/04/2020 Duration: 25.07 mins  Patient history: 69 year old lady with known prior history of left MCA infarct with mild residual aphasia and right-sided weakness with sudden onset of altered mental status following elective left middle cerebral artery bifurcation aneurysm successful coiling with exam consistent with aphasia and mild right-sided weakness and encephalopathy. EEG to evaluate for seizure  Level of alertness: Awake  AEDs during EEG study: GBP, PGB  Technical aspects: This EEG study was done with scalp electrodes positioned according to the 10-20 International system of electrode placement. Electrical activity was acquired at a sampling rate of 500Hz  and reviewed with a high frequency filter of 70Hz  and a low frequency filter of 1Hz . EEG data were recorded continuously and digitally stored.   Description: The posterior dominant rhythm consists of 8 Hz activity of moderate voltage (25-35 uV) seen predominantly in posterior head regions, symmetric and reactive to eye opening and eye closing. EEG showed continuous left temporal 3 to 6 Hz theta-delta slowing as well as intermittent generalized 3 to 6 Hz theta-delta slowing. Hyperventilation and photic stimulation were not performed.     ABNORMALITY -Continuous slow, left temporal  IMPRESSION: This study is suggestive of cortical dysfunction in left temporal region secondary to underlying structural abnormality/stroke. Additionally there is mild diffuse encephalopathy, nonspecific etiology. No seizures or epileptiform discharges were seen throughout the recording.  Bethany [name]

## 2020-05-05 NOTE — Progress Notes (Signed)
Pt I&O cathed, 1200 urine returned. UA complete sent down.

## 2020-05-05 NOTE — Progress Notes (Addendum)
Referring Physician(s): Antony Contras (neurology)  Supervising Physician: Luanne Bras  Patient Status:  [facility] - In-pt  Chief Complaint: "It hurts to pee"  Subjective:  Left MCA aneurysm s/p endovascular embolization using WEB device via right femoral approach 05/04/2020 by Dr. Estanislado Pandy. Patient laying in bed resting comfortably. She responds to voice and follows simple commands. Complains of dysuria- per RN patient screams in pain when urinating, purewick in place.  Had eventful post-procedure course, however now appears back to baseline- speech back to baseline, can spontaneously move all extremities. Right femoral puncture site c/d/i but tender.  MR brain this AM: 1. No acute intracranial infarct or other abnormality. 2. Evolving encephalomalacia with laminar necrosis involving the posterior left temporal region related to previously identified left MCA territory infarct. 3. Underlying mild chronic microvascular ischemic disease. 4. Right mastoid effusion.   Allergies: Contrast media [iodinated diagnostic agents]  Medications: Prior to Admission medications   Medication Sig Start Date End Date Taking? Authorizing Provider  allopurinol (ZYLOPRIM) 100 MG tablet Take 100 mg by mouth at bedtime.   Yes [provider]  amLODipine (NORVASC) 10 MG tablet Take 10 mg by mouth daily.   Yes [provider]  APPLE CIDER VINEGAR PO Take 1 tablet by mouth daily.   Yes [provider]  aspirin EC 81 MG EC tablet Take 1 tablet (81 mg total) by mouth daily. Swallow whole. 11/22/19  Yes Danford, Suann Larry, MD  atorvastatin (LIPITOR) 80 MG tablet Take 1 tablet (80 mg total) by mouth daily. 11/22/19  Yes Danford, Suann Larry, MD  baclofen (LIORESAL) 10 MG tablet Take 10 mg by mouth 3 (three) times daily as needed for muscle spasms.   Yes [provider]  buprenorphine (BUTRANS) 20 MCG/HR PTWK Place 1 patch onto the skin every Monday.  11/15/19  Yes  [provider]  clopidogrel (PLAVIX) 75 MG tablet Take 75 mg by mouth daily.   Yes [provider]  Dulaglutide (TRULICITY) 1.5 EX/9.3ZJ SOPN Inject 1.5 mg into the skin every Monday.    Yes [provider]  ezetimibe (ZETIA) 10 MG tablet Take 10 mg by mouth daily.   Yes [provider]  gabapentin (NEURONTIN) 600 MG tablet Take 600 mg by mouth 4 (four) times daily.    Yes [provider]  insulin glargine, 1 Unit Dial, (TOUJEO SOLOSTAR) 300 UNIT/ML Solostar Pen Inject 20 Units into the skin at bedtime.    Yes [provider]  Ipratropium-Albuterol (COMBIVENT) 20-100 MCG/ACT AERS respimat Inhale 1 puff into the lungs every 6 (six) hours as needed for wheezing or shortness of breath. 03/17/17  Yes Hongalgi, Lenis Dickinson, MD  levothyroxine (SYNTHROID) 75 MCG tablet Take 75 mcg by mouth daily. 02/28/19  Yes [provider]  losartan (COZAAR) 25 MG tablet Take 1 tablet (25 mg total) by mouth daily. 11/22/19 11/21/20 Yes Danford, Suann Larry, MD  metoprolol (LOPRESSOR) 50 MG tablet Take 50 mg by mouth 2 (two) times daily.  03/06/14  Yes [provider]  Multiple Vitamin (MULTIVITAMIN WITH MINERALS) TABS tablet Take 1 tablet by mouth daily.   Yes [provider]  oxyCODONE-acetaminophen (PERCOCET) 10-325 MG tablet Take 1 tablet by mouth every 6 (six) hours as needed (breakthrough pain).    Yes [provider]  pregabalin (LYRICA) 25 MG capsule Take 25 mg by mouth 2 (two) times daily.  01/24/19  Yes [provider]  sennosides-docusate sodium (SENOKOT-S) 8.6-50 MG tablet Take 1 tablet by mouth at  bedtime.   Yes [provider]  Vitamin D, Ergocalciferol, (DRISDOL) 1.25 MG (50000 UNIT) CAPS capsule Take 50,000 Units by mouth every Monday.    Yes [provider]  XTAMPZA ER 27 MG C12A Take 27 mg by mouth 2 (two) times daily.  02/27/19  Yes [provider]  feeding supplement, ENSURE ENLIVE,  (ENSURE ENLIVE) LIQD Take 237 mLs by mouth 2 (two) times daily between meals. 03/30/19   Bonnell Public, MD  furosemide (LASIX) 40 MG tablet Take 1 tablet (40 mg total) by mouth daily as needed. Patient taking differently: Take 40 mg by mouth daily as needed for fluid. 05/20/19 05/19/20  O'NealCassie Freer, MD  lidocaine-prilocaine (EMLA) cream Apply 1 application topically as needed. Patient taking differently: Apply 1 application topically daily as needed (port access). 07/03/19   Truitt Merle, MD  [facility] 4 MG/0.1ML LIQD nasal spray kit Place 1 spray into the nose as needed for opioid reversal. 08/22/19   [provider]  pantoprazole (PROTONIX) 40 MG tablet Take 40 mg by mouth every morning.    [provider]     Vital Signs: BP (!) 122/53   Pulse (!) 54   Temp 97.8 F (36.6 C) (Oral)   Resp 15   Ht $R'5\' 5"'uX$  (1.651 m)   Wt 180 lb 5.4 oz (81.8 kg)   SpO2 99%   BMI 30.01 kg/m   Physical Exam Vitals and nursing note reviewed.  Constitutional:      General: She is not in acute distress.    Appearance: Normal appearance.  Cardiovascular:     Rate and Rhythm: Normal rate and regular rhythm.     Heart sounds: Normal heart sounds. No murmur heard.   Pulmonary:     Effort: Pulmonary effort is normal. No respiratory distress.     Breath sounds: Normal breath sounds. No wheezing.  Genitourinary:    Comments: (+) purewick. Skin:    General: Skin is warm and dry.     Comments: Right femoral puncture site soft without active bleeding or hematoma, however exquisitely tender to palpation.  Neurological:     Mental Status: She is alert.     Comments: Alert, awake, and oriented x3. Speech and comprehension intact (speech back to baseline). PERRL bilaterally, 2-3 mm. EOMs intact bilaterally. nystagmus or subjective diplopia. No facial asymmetry. Tongue midline. Can spontaneously move all extremities, no weakness identified. No pronator drift. Fine motor and  coordination delayed but symmetric (no past-pointing noted). Distal pulses (DPs) 1+ bilaterally.     Imaging: MR BRAIN WO CONTRAST  Result Date: 05/05/2020 CLINICAL DATA:  Initial evaluation for speech abnormality following recent aneurysm repair. EXAM: MRI HEAD WITHOUT CONTRAST TECHNIQUE: Multiplanar, multiecho pulse sequences of the brain and surrounding structures were obtained without intravenous contrast. COMPARISON:  Previous CT from 05/04/2020 and MRI from 11/19/2019. FINDINGS: Brain: Cerebral volume within normal limits for age. Mild scattered chronic microvascular ischemic changes noted involving the supratentorial cerebral white matter and pons. Involving encephalomalacia of with laminar necrosis seen at the posterior left temporal region related to the previously identified infarct. Associated susceptibility artifact related to laminar necrosis and/or chronic hemosiderin staining. No other abnormal foci of restricted diffusion to suggest acute or subacute ischemia. Gray-white matter differentiation otherwise maintained. No other evidence for acute or chronic intracranial hemorrhage. No mass lesion, midline shift or mass effect. No hydrocephalus or extra-axial fluid collection. Pituitary gland suprasellar region normal. Midline structures intact. Vascular: Major intracranial vascular flow voids are maintained. Skull  and upper cervical spine: Craniocervical junction within normal limits. Diffusely decreased T1 signal intensity seen throughout the visualized bone marrow, nonspecific, but most commonly related to anemia, smoking, or obesity. No scalp soft tissue abnormality. Sinuses/Orbits: Patient status post bilateral ocular lens replacement. Globes and orbital soft tissues demonstrate no acute finding. Paranasal sinuses are largely clear. Right mastoid effusion noted. Visualized nasopharynx within normal limits. Other: None. IMPRESSION: 1. No acute intracranial infarct or other abnormality. 2.  Evolving encephalomalacia with laminar necrosis involving the posterior left temporal region related to previously identified left MCA territory infarct. 3. Underlying mild chronic microvascular ischemic disease. 4. Right mastoid effusion. Electronically Signed   By: Jeannine Boga M.D.   On: 05/05/2020 01:36   CT HEAD CODE STROKE WO CONTRAST  Result Date: 05/04/2020 CLINICAL DATA:  Code stroke.  Stroke follow-up. EXAM: CT HEAD WITHOUT CONTRAST TECHNIQUE: Contiguous axial images were obtained from the base of the skull through the vertex without intravenous contrast. COMPARISON:  CT head and MRI from July 6, 21. FINDINGS: Brain: Evolving encephalomalacia in the lateral left temporal lobe, in the region of the previously characterized infarct. Decreased edema and mass effect. No evidence of new/acute large vascular territory infarct. No acute hemorrhage. Vascular: No hyperdense vessel identified. Post-surgical change related to left MCA aneurysm embolization with a web flow disrupter device. Skull: No acute fracture. Sinuses/Orbits: Mild paranasal sinus mucosal thickening without air-fluid levels. Unremarkable orbits. Other: No mastoid effusions. ASPECTS [facility] Stroke Program Early CT Score) total score (0-10 with 10 being normal): 10 IMPRESSION: 1. No evidence of acute intracranial abnormality. ASPECTS is 10. 2. Evolving encephalomalacia in the lateral left temporal lobe, in the region of the previously characterized infarct. 3. Post-surgical change related to left MCA aneurysm embolization with a web flow disrupter device. Code stroke imaging results were communicated on 05/04/2020 at 2:22 pm to provider Dr. Leonie Man via telephone, who verbally acknowledged these results. Electronically Signed   By: Margaretha Sheffield MD   On: 05/04/2020 14:28   CT ANGIO HEAD CODE STROKE  Result Date: 05/04/2020 CLINICAL DATA:  Stroke suspected. EXAM: CT ANGIOGRAPHY HEAD AND NECK TECHNIQUE: Multidetector CT imaging of  the head and neck was performed using the standard protocol during bolus administration of intravenous contrast. Multiplanar CT image reconstructions and MIPs were obtained to evaluate the vascular anatomy. Carotid stenosis measurements (when applicable) are obtained utilizing NASCET criteria, using the distal internal carotid diameter as the denominator. CONTRAST:  38m OMNIPAQUE IOHEXOL 350 MG/ML SOLN COMPARISON:  MRA November 20, 2019. CT chest June 15 21. FINDINGS: CTA NECK FINDINGS Aortic arch: Great vessel origins are patent. Right carotid system: No evidence of dissection, stenosis (50% or greater) or occlusion. Atherosclerosis at the bifurcation. Left carotid system: No evidence of dissection, stenosis (50% or greater) or occlusion. Atherosclerosis at the bifurcation. Vertebral arteries: Codominant. No evidence of dissection, stenosis (50% or greater) or occlusion. Skeleton: No acute findings. Other neck: No mass or suspicious adenopathy. Upper chest: Small bilateral pleural effusions. Patchy opacities in the lung apices may represent aspiration and/or pneumonia. Review of the MIP images confirms the above findings CTA HEAD FINDINGS Anterior circulation: No significant proximal stenosis or large vessel occlusion. Status post treatment of a left MCA aneurysm with web flow disrupted device. There is some expected flow within the aneurysm sac. Approximately 2 mm left posterior communicating artery aneurysm (see series 7, image 109). Distal left M2/M3 MCA branch stenosis was better characterized on the prior MRA. Posterior circulation: No significant stenosis, proximal occlusion,  aneurysm, or vascular malformation. Mild stenosis of the basilar artery. Venous sinuses: As permitted by contrast timing, patent. Review of the MIP images confirms the above findings IMPRESSION: 1. No large vessel occlusion or new hemodynamically significant proximal stenosis in the head or neck. 2. Status post treatment of a left MCA  aneurysm with web flow disrupted device. There is some expected flow within the aneurysm sac. 3. 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 4. Small bilateral pleural effusions. Patchy opacities in the lung apices may represent aspiration and/or pneumonia. Additionally, there appears to be a new 1 cm subpleural nodular opacity in the imaged lateral left upper lobe that is new since CT chest from October 29, 2019. While this most likely represents an infectious/inflammatory process, recommend follow-chest CT when the patient is able to exclude pulmonary nodule. Critical findings discussed with Dr. Marjory Lies at 2:24 PM and Dr. Leonie Man at 2:45 PM via telephone. Lung findings discussed with Dr. Leonie Man at 3:00 PM. Electronically Signed   By: Margaretha Sheffield MD   On: 05/04/2020 15:05   CT ANGIO NECK CODE STROKE  Result Date: 05/04/2020 CLINICAL DATA:  Stroke suspected. EXAM: CT ANGIOGRAPHY HEAD AND NECK TECHNIQUE: Multidetector CT imaging of the head and neck was performed using the standard protocol during bolus administration of intravenous contrast. Multiplanar CT image reconstructions and MIPs were obtained to evaluate the vascular anatomy. Carotid stenosis measurements (when applicable) are obtained utilizing NASCET criteria, using the distal internal carotid diameter as the denominator. CONTRAST:  60m OMNIPAQUE IOHEXOL 350 MG/ML SOLN COMPARISON:  MRA November 20, 2019. CT chest June 15 21. FINDINGS: CTA NECK FINDINGS Aortic arch: Great vessel origins are patent. Right carotid system: No evidence of dissection, stenosis (50% or greater) or occlusion. Atherosclerosis at the bifurcation. Left carotid system: No evidence of dissection, stenosis (50% or greater) or occlusion. Atherosclerosis at the bifurcation. Vertebral arteries: Codominant. No evidence of dissection, stenosis (50% or greater) or occlusion. Skeleton:  No acute findings. Other neck: No mass or suspicious adenopathy. Upper chest: Small bilateral pleural effusions. Patchy opacities in the lung apices may represent aspiration and/or pneumonia. Review of the MIP images confirms the above findings CTA HEAD FINDINGS Anterior circulation: No significant proximal stenosis or large vessel occlusion. Status post treatment of a left MCA aneurysm with web flow disrupted device. There is some expected flow within the aneurysm sac. Approximately 2 mm left posterior communicating artery aneurysm (see series 7, image 109). Distal left M2/M3 MCA branch stenosis was better characterized on the prior MRA. Posterior circulation: No significant stenosis, proximal occlusion, aneurysm, or vascular malformation. Mild stenosis of the basilar artery. Venous sinuses: As permitted by contrast timing, patent. Review of the MIP images confirms the above findings IMPRESSION: 1. No large vessel occlusion or new hemodynamically significant proximal stenosis in the head or neck. 2. Status post treatment of a left MCA aneurysm with web flow disrupted device. There is some expected flow within the aneurysm sac. 3. 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 4. Small bilateral pleural effusions. Patchy opacities in the lung apices may represent aspiration and/or pneumonia. Additionally, there appears to be a new 1 cm subpleural nodular opacity in the imaged lateral left upper lobe that is new since CT chest from October 29, 2019. While this most likely represents an infectious/inflammatory process, recommend follow-chest CT when the patient is able to exclude pulmonary  nodule. Critical findings discussed with Dr. Marjory Lies at 2:24 PM and Dr. Leonie Man at 2:45 PM via telephone. Lung findings discussed with Dr. Leonie Man at 3:00 PM. Electronically Signed   By: Margaretha Sheffield MD   On: 05/04/2020 15:05     Labs:  CBC: Recent Labs    04/24/20 1225 04/27/20 0719 05/04/20 1645 05/05/20 0436  WBC 7.2 12.1* 14.0* 14.3*  HGB 6.8* 10.1* 8.8* 7.8*  HCT 25.0* 33.6* 29.7* 27.5*  PLT 277 296 229 200    COAGS: Recent Labs    11/19/19 1317 02/06/20 0828 04/27/20 0719  INR 1.0 1.0 1.1  APTT 26  --  29    BMP: Recent Labs    11/20/19 0433 11/22/19 1139 01/24/20 1212 02/06/20 0718 02/06/20 1124 04/27/20 0719 05/04/20 1645 05/05/20 0436  NA 139 136 139 137 142 138 137 140  K 4.2 4.2 4.6 6.4* 5.4* 4.9 5.0 4.4  CL 103 103 107 103 108 105 108 109  CO2 _0 --  24 21* 21*  GLUCOSE 131* 169* 232* 410* 269* 228* 242* 106*  BUN 16 12 26* 28* 31* 24* 27* 22  CALCIUM 9.9 9.8 10.8* 10.4*  --  10.1 8.7* 8.8*  CREATININE 1.01*  1.04* 1.03* 1.21* 1.56* 1.30* 1.58* 1.26* 1.12*  GFRNONAA 57*  55* 55* 46* 34*  --  35* 46* 53*  GFRAA >60  >60 >60 53* 39*  --   --   --   --     LIVER FUNCTION TESTS: Recent Labs    07/19/19 1340 11/19/19 1317 01/24/20 1212 05/04/20 1645  BILITOT 0.5 0.7 0.3 0.6  AST 77* _1 ALT 119* 23 56* 65*  ALKPHOS 330* 159* 237* 192*  PROT 7.4 6.9 7.2 5.3*  ALBUMIN 3.9 3.9 3.9 3.0*    Assessment and Plan:  Left MCA aneurysm s/p endovascular embolization using WEB device via right femoral approach 05/04/2020 by Dr. Estanislado Pandy. Patient's condition improving- speech back to baseline, spontaneously moving all extremities without weakness. Right femoral puncture site soft but very tender- will obtain VAS [place] to R/O pseudoaneurysm. Dysuria- will obtain UA/culture. PT/OT evaluations to assess needs prior to discharge- order placed, bedrest discontinued. Advance diet as tolerated. Continue taking Plavix 75 mg once daily and Aspirin 81 mg once daily. Please give additional Plavix 75 mg x1 today per Dr. Estanislado Pandy. Appreciate neurology input on this patient. NIR to follow.   Electronically Signed: Earley Abide, PA-C 05/05/2020, 8:56 AM   I  spent a total of 25 Minutes at the the patient's bedside AND on the patient's hospital floor or unit, greater than 50% of which was counseling/coordinating care for left MCA aneurysm s/p embolization.

## 2020-05-06 ENCOUNTER — Inpatient Hospital Stay: Payer: Medicare Other

## 2020-05-06 LAB — URINE CULTURE: Culture: NO GROWTH

## 2020-05-06 LAB — GLUCOSE, CAPILLARY
Glucose-Capillary: 156 mg/dL — ABNORMAL HIGH (ref 70–99)
Glucose-Capillary: 127 mg/dL — ABNORMAL HIGH (ref 70–99)
Glucose-Capillary: 189 mg/dL — ABNORMAL HIGH (ref 70–99)
Glucose-Capillary: 239 mg/dL — ABNORMAL HIGH (ref 70–99)
Glucose-Capillary: 225 mg/dL — ABNORMAL HIGH (ref 70–99)

## 2020-05-06 MED ORDER — GABAPENTIN 400 MG PO CAPS
400.0000 mg | ORAL_CAPSULE | Freq: Four times a day (QID) | ORAL | Status: DC
  Administered 2020-05-06 – 2020-05-08 (×7): 400 mg via ORAL
  Filled 2020-05-06 (×8): qty 1

## 2020-05-06 MED ORDER — GABAPENTIN 800 MG PO TABS
400.0000 mg | ORAL_TABLET | Freq: Four times a day (QID) | ORAL | Status: DC
Start: 2020-05-06 — End: 2020-05-06
  Filled 2020-05-06 (×2): qty 0.5

## 2020-05-06 NOTE — Progress Notes (Signed)
NIR Brief Note:  Left MCA aneurysms/p endovascular embolizationusing WEB device via right femoral approach 12/20/2021by Dr. Estanislado Pandy.  Patient seen by myself and Dr. Estanislado Pandy this AM. Sitting in chair watching TV and eating breakfast with no complaints at this time. Lungs CTA, heart RRR. Can spontaneously move all extremities. No pronator drift. Right femoral puncture site soft with mild tenderness, no erythema or active bleeding noted.  Plan for possible discharge today pending PT/OT evaluations. Hgb 7.8 (down from 8.8)- patient undergoes routine blood/iron transfusions with Dr. Burr Medico, she states she had an appointment today for this but had to cancel it (as she is still admitted at [facility])- requesting I call Dr. Ernestina Penna office regarding rescheduling. I spoke to Tammy, Therapist, sports- she states Dr. Burr Medico is out of town and she will reach out to schedulers to reschedule blood/iron transfusion and call patient with appointment.  Additional D/C instructions were discussed with patient: 1- Follow-up with Dr. Estanislado Pandy in clinic 2 weeks after discharge (NIR schedulers to call patient to set up this appointment). 2- No stooping, bending, or lifting more than 10 pounds until 2 week follow-up appointment. 3- No driving self until 2 week follow-up appointment. 4- Follow-up with Dr. Ernestina Penna office within 1 week (at scheduled appointment time- per their office they will call you to set up this appointment).  NIR to follow.   Bea Graff Glendene Wyer, PA-C 05/06/2020, 10:23 AM

## 2020-05-06 NOTE — Progress Notes (Signed)
Inpatient Rehabilitation Admissions Coordinator   Therapy feels patient not progressed to baseline and is requesting a rehab consult. Please place order for rehab consult so that we can consult for candidacy for a possible Cir admit.  Danne Baxter, RN, MSN Rehab Admissions Coordinator [phone] 05/06/2020 1:43 PM

## 2020-05-06 NOTE — Progress Notes (Signed)
Physical Therapy Treatment Patient Details Name: Bethany [name] MRN: [id number] DOB: [birth year] Today's Date: 05/06/2020    History of Present Illness 69 yo female with L MCA aneurysm s/p endovascular embolization using WEB device via right femoral approach 05/04/2020. post-operatively, pt with RUE/LE weakness and aphasia, resolving since, no acute CVA appreciated on CT. 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    PT Comments    Pt continuing to demonstrate significant gait instability this day, pt with little awareness of needing to terminate gait and rest even when bilateral LEs are buckling and pt becomes less responsive to PT. Pt is very eager to d/c home, PT expressing safety and fall risk concerns to both pt and pt's brother at bedside. Pt also complaining of significant L sided neck pain when up and moving, RN notified who is notifying Dr. Fatima Sanger team. PT continuing to recommend CIR at this time, will continue to follow acutely.     Follow Up Recommendations  CIR     Equipment Recommendations  None recommended by PT    Recommendations for Other Services       Precautions / Restrictions Precautions Precautions: Fall Restrictions Weight Bearing Restrictions: No    Mobility  Bed Mobility Overal bed mobility: Needs Assistance             General bed mobility comments: up in chair upon PT arrival to room  Transfers Overall transfer level: Needs assistance Equipment used: 1 person hand held assist Transfers: Sit to/from Stand Sit to Stand: Min assist         General transfer comment: min assist for initial power up and steady, STS x2 from recliner and chair in hallway  Ambulation/Gait Ambulation/Gait assistance: Min assist;+2 safety/equipment (chair follow) Gait Distance (Feet): 20 Feet Assistive device: Rolling walker (2 wheeled) Gait Pattern/deviations: Step-through pattern;Decreased stride length;Drifts  right/left;Shuffle Gait velocity: decr, unsteady   General Gait Details: min assist to steady, VCs for upright posture and preventing truncal hypertextension. Pt holding head in cervical extension and L lateral flexion to "add compression to my area of neck pain, it helps". Pt requiring seated rest break x1 for bilateral knee buckling, decreased responsiveness   Stairs             Wheelchair Mobility    Modified Rankin (Stroke Patients Only)       Balance Overall balance assessment: Needs assistance Sitting-balance support: No upper extremity supported Sitting balance-Leahy Scale: Fair     Standing balance support: Single extremity supported;During functional activity Standing balance-Leahy Scale: Poor Standing balance comment: reliant on external assist in standing                            Cognition Arousal/Alertness: Awake/alert Behavior During Therapy: WFL for tasks assessed/performed Overall Cognitive Status: Impaired/Different from baseline Area of Impairment: Memory;Following commands;Safety/judgement;Problem solving                     Memory: Decreased short-term memory (long term deficits) Following Commands: Follows one step commands with increased time Safety/Judgement: Decreased awareness of deficits;Decreased awareness of safety   Problem Solving: Requires verbal cues;Requires tactile cues;Difficulty sequencing;Slow processing General Comments: A&Ox4, lacking insight into current deficits and how that will impact d/c home. Pt stating "well this is the first time I've seen the christmas tree in the hall!" when PT and OT pointed out tree to pt yesterday.  Exercises      General Comments        Pertinent Vitals/Pain Pain Assessment: Faces Faces Pain Scale: Hurts little more Pain Location: L side neck Pain Descriptors / Indicators: Pounding;Burning Pain Intervention(s): Limited activity within patient's tolerance;Monitored  during session;Repositioned    Home Living                      Prior Function            PT Goals (current goals can now be found in the care plan section) Acute Rehab PT Goals Patient Stated Goal: go home PT Goal Formulation: With patient Time For Goal Achievement: 05/19/20 Potential to Achieve Goals: Good Progress towards PT goals: Progressing toward goals    Frequency    Min 4X/week      PT Plan Current plan remains appropriate    Co-evaluation              AM-PAC PT "6 Clicks" Mobility   Outcome Measure  Help needed turning from your back to your side while in a flat bed without using bedrails?: A Little Help needed moving from lying on your back to sitting on the side of a flat bed without using bedrails?: A Little Help needed moving to and from a bed to a chair (including a wheelchair)?: A Little Help needed standing up from a chair using your arms (e.g., wheelchair or bedside chair)?: A Little Help needed to walk in hospital room?: A Little Help needed climbing 3-5 steps with a railing? : A Lot 6 Click Score: 17    End of Session Equipment Utilized During Treatment: Gait belt Activity Tolerance: Patient limited by fatigue Patient left: in chair;with chair alarm set;with call bell/phone within reach;with family/visitor present Nurse Communication: Mobility status PT Visit Diagnosis: Other abnormalities of gait and mobility (R26.89);Muscle weakness (generalized) (M62.81)     Time: 4098-1191 PT Time Calculation (min) (ACUTE ONLY): 25 min  Charges:  $Gait Training: 8-22 mins $Therapeutic Activity: 8-22 mins                     Stacie Glaze, PT Acute Rehabilitation Services Pager [phone]  Office [phone]    Louis Matte 05/06/2020, 12:42 PM

## 2020-05-06 NOTE — Consult Note (Addendum)
Physical Medicine and Rehabilitation Consult   Reason for Consult: Functional deficits Referring Physician: Dr. Estanislado Pandy   HPI: Bethany [name] is a 69 y.o. female with history of HTN, COPD, T2DM, solitary kidney with CKD, anemia, chronic back pain, CVA 7/21 with mild residual aphasia, L-MCA aneurysm who was admitted on 05/04/20 for embolization by Dr. Estanislado Pandy. Post op patient had mild right sided weakness with aphasia. CT/MRI brain was negative for acute changes. EEG done revealing mild diffuse encephalopathy with slowing in left temporal region.  Patient with persistent leucocytosis and UCS pending. Dr. Leonie Man consulted for input and felt that patient with symptoms consistent with TIA v/s transient post anesthesia v/s seizure. Mentation improving and speech deficits resolved. Therapy ongoing and patient noted to be limited by significant neck pain iwith constant headaches, dizziness with activity, BLE instability, delayed processing and weakness with balance deficits. Physical Medicine & Rehabilitation was consulted to assess candidacy for CIR. Currently with neck muscle spasm. Would like to have more time working with therapy tomorrow.    Review of Systems  Constitutional: Negative for chills, fever and malaise/fatigue.  HENT: Negative for hearing loss and tinnitus.   Eyes: Negative for blurred vision and double vision.  Respiratory: Negative for cough and shortness of breath.   Cardiovascular: Negative for chest pain and palpitations.  Gastrointestinal: Negative for heartburn and nausea (better since admission).  Musculoskeletal: Positive for back pain, myalgias and neck pain (neck).  Skin: Negative for itching and rash.  Neurological: Positive for dizziness, sensory change (hands and feet), speech change and headaches (since admission--worse now).  Psychiatric/Behavioral: The patient has insomnia (having nightmares).       Past Medical History:  Diagnosis Date  . Acute renal  failure ([place])   . Anemia   . Arthritis   . COPD (chronic obstructive pulmonary disease) ([place])   . Diabetes mellitus without complication ([place])    type II   . GERD (gastroesophageal reflux disease)   . Hypercalcemia   . Hyperlipidemia   . Hypertension   . Pneumonia   . Renal disorder   . Single kidney   . Stroke [facility])    July 2021  . Thrombosis   . Tobacco abuse     Past Surgical History:  Procedure Laterality Date  . ABDOMINAL HYSTERECTOMY    . blood clots removed from descending aorta     . CHOLECYSTECTOMY    . COLONOSCOPY    . COLONOSCOPY WITH PROPOFOL N/A 04/17/2017   Procedure: COLONOSCOPY WITH PROPOFOL;  Surgeon: Wilford Corner, MD;  Location: [facility];  Service: Endoscopy;  Laterality: N/A;  . ESOPHAGOGASTRODUODENOSCOPY (EGD) WITH PROPOFOL N/A 04/17/2017   Procedure: ESOPHAGOGASTRODUODENOSCOPY (EGD) WITH PROPOFOL;  Surgeon: Wilford Corner, MD;  Location: [facility];  Service: Endoscopy;  Laterality: N/A;  . IR 3D INDEPENDENT WKST  05/04/2020  . IR ANGIO INTRA EXTRACRAN SEL COM CAROTID INNOMINATE BILAT MOD SED  02/06/2020  . IR ANGIO VERTEBRAL SEL VERTEBRAL BILAT MOD SED  02/06/2020  . IR ANGIOGRAM FOLLOW UP STUDY  05/04/2020  . IR ANGIOGRAM SELECTIVE EACH ADDITIONAL VESSEL  05/04/2020  . IR IMAGING GUIDED PORT INSERTION  05/01/2019  . IR TRANSCATH/EMBOLIZ  05/04/2020  . IR US GUIDE VASC ACCESS RIGHT  02/06/2020  . IR US GUIDE VASC ACCESS RIGHT  05/04/2020  . NEPHRECTOMY RECIPIENT    . RADIOLOGY WITH ANESTHESIA N/A 05/04/2020   Procedure: [place]ppard Plumber;  Surgeon: Luanne Bras, MD;  Location: [place];  Service: Radiology;  Laterality: N/A;  Family History  Problem Relation Age of Onset  . Hypertension Mother   . Diabetes Father   . Hypertension Brother   . Breast cancer Maternal Aunt        95s  . Breast cancer Maternal Aunt 75       colon cancer    Social History: Lives alone in an apartment and daughter lives across the hall. She was  independent PTA. She reports that she has been smoking cigarettes--1/2 pack per day. She has a 10.00 pack-year smoking history. She has never used smokeless tobacco. She reports current alcohol use--mixed drink on the weekends.  She reports that she does not use drugs.   Allergies  Allergen Reactions  . Contrast Media [Iodinated Diagnostic Agents]     Renal hypertension per physician    Medications Prior to Admission  Medication Sig Dispense Refill  . allopurinol (ZYLOPRIM) 100 MG tablet Take 100 mg by mouth at bedtime.    Marland Kitchen amLODipine (NORVASC) 10 MG tablet Take 10 mg by mouth daily.    . APPLE CIDER VINEGAR PO Take 1 tablet by mouth daily.    Marland Kitchen aspirin EC 81 MG EC tablet Take 1 tablet (81 mg total) by mouth daily. Swallow whole. 30 tablet 11  . atorvastatin (LIPITOR) 80 MG tablet Take 1 tablet (80 mg total) by mouth daily. 30 tablet 11  . baclofen (LIORESAL) 10 MG tablet Take 10 mg by mouth 3 (three) times daily as needed for muscle spasms.    . buprenorphine (BUTRANS) 20 MCG/HR PTWK Place 1 patch onto the skin every Monday.     . clopidogrel (PLAVIX) 75 MG tablet Take 75 mg by mouth daily.    . Dulaglutide (TRULICITY) 1.5 RK/2.7CW SOPN Inject 1.5 mg into the skin every Monday.     . ezetimibe (ZETIA) 10 MG tablet Take 10 mg by mouth daily.    Marland Kitchen gabapentin (NEURONTIN) 600 MG tablet Take 600 mg by mouth 4 (four) times daily.     . insulin glargine, 1 Unit Dial, (TOUJEO SOLOSTAR) 300 UNIT/ML Solostar Pen Inject 20 Units into the skin at bedtime.     . Ipratropium-Albuterol (COMBIVENT) 20-100 MCG/ACT AERS respimat Inhale 1 puff into the lungs every 6 (six) hours as needed for wheezing or shortness of breath. 1 Inhaler 0  . levothyroxine (SYNTHROID) 75 MCG tablet Take 75 mcg by mouth daily.    Marland Kitchen losartan (COZAAR) 25 MG tablet Take 1 tablet (25 mg total) by mouth daily. 30 tablet 11  . metoprolol (LOPRESSOR) 50 MG tablet Take 50 mg by mouth 2 (two) times daily.     . Multiple Vitamin  (MULTIVITAMIN WITH MINERALS) TABS tablet Take 1 tablet by mouth daily.    Marland Kitchen oxyCODONE-acetaminophen (PERCOCET) 10-325 MG tablet Take 1 tablet by mouth every 6 (six) hours as needed (breakthrough pain).     . pregabalin (LYRICA) 25 MG capsule Take 25 mg by mouth 2 (two) times daily.     . sennosides-docusate sodium (SENOKOT-S) 8.6-50 MG tablet Take 1 tablet by mouth at bedtime.    . Vitamin D, Ergocalciferol, (DRISDOL) 1.25 MG (50000 UNIT) CAPS capsule Take 50,000 Units by mouth every Monday.     Marland Kitchen XTAMPZA ER 27 MG C12A Take 27 mg by mouth 2 (two) times daily.     . feeding supplement, ENSURE ENLIVE, (ENSURE ENLIVE) LIQD Take 237 mLs by mouth 2 (two) times daily between meals. 237 mL 12  . furosemide (LASIX) 40 MG tablet Take 1 tablet (40  mg total) by mouth daily as needed. (Patient taking differently: Take 40 mg by mouth daily as needed for fluid.) 45 tablet 3  . lidocaine-prilocaine (EMLA) cream Apply 1 application topically as needed. (Patient taking differently: Apply 1 application topically daily as needed (port access).) 30 g 1  . NARCAN 4 MG/0.1ML LIQD nasal spray kit Place 1 spray into the nose as needed for opioid reversal.    . pantoprazole (PROTONIX) 40 MG tablet Take 40 mg by mouth every morning.      Home: Home Living Family/patient expects to be discharged to:: Private residence Living Arrangements: Alone Available Help at Discharge: Family (dtr lives across the hall) Type of Home: Apartment Home Access: Level entry [place]: One level Bathroom Shower/Tub: Chiropodist: [place] - single point  Functional History: Prior Function Level of Independence: Independent Functional Status:  Mobility: Bed Mobility Overal bed mobility: Needs Assistance Bed Mobility: Supine to Sit Supine to sit: Min assist,HOB elevated General bed mobility comments: up in chair upon PT arrival to room Transfers Overall transfer level: Needs  assistance Equipment used: 1 person hand held assist Transfers: Sit to/from Stand Sit to Stand: Min assist General transfer comment: min assist for initial power up and steady, STS x2 from recliner and chair in hallway Ambulation/Gait Ambulation/Gait assistance: Min assist,+2 safety/equipment (chair follow) Gait Distance (Feet): 20 Feet Assistive device: Rolling walker (2 wheeled) Gait Pattern/deviations: Step-through pattern,Decreased stride length,Drifts right/left,Shuffle General Gait Details: min assist to steady, VCs for upright posture and preventing truncal hypertextension. Pt holding head in cervical extension and L lateral flexion to "add compression to my area of neck pain, it helps". Pt requiring seated rest break x1 for bilateral knee buckling, decreased responsiveness Gait velocity: decr, unsteady    ADL: ADL Overall ADL's : Needs assistance/impaired Eating/Feeding: Set up,Sitting Grooming: Set up,Sitting Upper Body Bathing: Min guard,Sitting Lower Body Bathing: Moderate assistance,Sit to/from stand,+2 for safety/equipment Upper Body Dressing : Sitting,Min guard Lower Body Dressing: Moderate assistance,+2 for safety/equipment,Sit to/from stand Lower Body Dressing Details (indicate cue type and reason): assist for socks today Toilet Transfer: Minimal assistance,+2 for physical assistance,+2 for safety/equipment,Stand-pivot,BSC Toilet Transfer Details (indicate cue type and reason): pt ambulatory a short distance in room/hallway post transfer to [place] and Hygiene: Minimal assistance,+2 for safety/equipment,Sit to/from stand Toileting - Clothing Manipulation Details (indicate cue type and reason): pt performing pericare in standing with minA for balance Functional mobility during ADLs: Minimal assistance,+2 for safety/equipment (IV poled)  Cognition: Cognition Overall Cognitive Status: Impaired/Different from baseline Orientation Level:  Oriented X4 Cognition Arousal/Alertness: Awake/alert Behavior During Therapy: WFL for tasks assessed/performed Overall Cognitive Status: Impaired/Different from baseline Area of Impairment: Memory,Following commands,Safety/judgement,Problem solving Memory: Decreased short-term memory (long term deficits) Following Commands: Follows one step commands with increased time Safety/Judgement: Decreased awareness of deficits,Decreased awareness of safety Problem Solving: Requires verbal cues,Requires tactile cues,Difficulty sequencing,Slow processing General Comments: A&Ox4, lacking insight into current deficits and how that will impact d/c home. Pt stating "well this is the first time I've seen the christmas tree in the hall!" when PT and OT pointed out tree to pt yesterday.   Blood pressure (!) 146/51, pulse (!) 55, temperature 98.4 F (36.9 C), temperature source Oral, resp. rate 13, height '5\' 5"'  (1.651 m), weight 81.8 kg, SpO2 100 %. Physical Exam Gen: no distress, normal appearing HEENT: oral mucosa pink and moist, NCAT Cardio: Bradycradia Chest: normal effort, normal rate of breathing Abd: soft, non-distended Ext: no edema Vitals and  nursing note reviewed.  Constitutional:      Appearance: Normal appearance.  Neck:     Comments: Unable to move head to left due to torticollis.  Musculoskeletal:     Cervical back: Tenderness (fullness left neck with pain on palpation. ) present.  Neurological:     Mental Status: She is alert and oriented to person, place, and time. Strength is intact.    Results for orders placed or performed during the hospital encounter of 05/04/20 (from the past 24 hour(s))  Glucose, capillary     Status: Abnormal   Collection Time: 05/05/20  4:07 PM  Result Value Ref Range   Glucose-Capillary 200 (H) 70 - 99 mg/dL  Glucose, capillary     Status: Abnormal   Collection Time: 05/05/20  8:05 PM  Result Value Ref Range   Glucose-Capillary 238 (H) 70 - 99 mg/dL   Glucose, capillary     Status: Abnormal   Collection Time: 05/05/20 11:46 PM  Result Value Ref Range   Glucose-Capillary 202 (H) 70 - 99 mg/dL  Glucose, capillary     Status: Abnormal   Collection Time: 05/06/20  4:02 AM  Result Value Ref Range   Glucose-Capillary 156 (H) 70 - 99 mg/dL  Glucose, capillary     Status: Abnormal   Collection Time: 05/06/20  8:24 AM  Result Value Ref Range   Glucose-Capillary 127 (H) 70 - 99 mg/dL  Glucose, capillary     Status: Abnormal   Collection Time: 05/06/20 11:37 AM  Result Value Ref Range   Glucose-Capillary 189 (H) 70 - 99 mg/dL   MR BRAIN WO CONTRAST  Result Date: 05/05/2020 CLINICAL DATA:  Initial evaluation for speech abnormality following recent aneurysm repair. EXAM: MRI HEAD WITHOUT CONTRAST TECHNIQUE: Multiplanar, multiecho pulse sequences of the brain and surrounding structures were obtained without intravenous contrast. COMPARISON:  Previous CT from 05/04/2020 and MRI from 11/19/2019. FINDINGS: Brain: Cerebral volume within normal limits for age. Mild scattered chronic microvascular ischemic changes noted involving the supratentorial cerebral white matter and pons. Involving encephalomalacia of with laminar necrosis seen at the posterior left temporal region related to the previously identified infarct. Associated susceptibility artifact related to laminar necrosis and/or chronic hemosiderin staining. No other abnormal foci of restricted diffusion to suggest acute or subacute ischemia. Gray-white matter differentiation otherwise maintained. No other evidence for acute or chronic intracranial hemorrhage. No mass lesion, midline shift or mass effect. No hydrocephalus or extra-axial fluid collection. Pituitary gland suprasellar region normal. Midline structures intact. Vascular: Major intracranial vascular flow voids are maintained. Skull and upper cervical spine: Craniocervical junction within normal limits. Diffusely decreased T1 signal intensity  seen throughout the visualized bone marrow, nonspecific, but most commonly related to anemia, smoking, or obesity. No scalp soft tissue abnormality. Sinuses/Orbits: Patient status post bilateral ocular lens replacement. Globes and orbital soft tissues demonstrate no acute finding. Paranasal sinuses are largely clear. Right mastoid effusion noted. Visualized nasopharynx within normal limits. Other: None. IMPRESSION: 1. No acute intracranial infarct or other abnormality. 2. Evolving encephalomalacia with laminar necrosis involving the posterior left temporal region related to previously identified left MCA territory infarct. 3. Underlying mild chronic microvascular ischemic disease. 4. Right mastoid effusion. Electronically Signed   By: Jeannine Boga M.D.   On: 05/05/2020 01:36   EEG adult  Result Date: 05/05/2020 Lora Havens, MD     05/05/2020  9:26 AM Patient Name: BLASA RAISCH MRN: [id number] Epilepsy Attending: Lora Havens Referring Physician/Provider: Dr Antony Contras Date: 05/04/2020  Duration: 25.07 mins Patient history: 69 year old lady with known prior history of left MCA infarct with mild residual aphasia and right-sided weakness with sudden onset of altered mental status following elective left middle cerebral artery bifurcation aneurysm successful coiling with exam consistent with aphasia and mild right-sided weakness and encephalopathy. EEG to evaluate for seizure Level of alertness: Awake AEDs during EEG study: GBP, PGB Technical aspects: This EEG study was done with scalp electrodes positioned according to the 10-20 International system of electrode placement. Electrical activity was acquired at a sampling rate of '500Hz'  and reviewed with a high frequency filter of '70Hz'  and a low frequency filter of '1Hz' . EEG data were recorded continuously and digitally stored. Description: The posterior dominant rhythm consists of 8 Hz activity of moderate voltage (25-35 uV) seen predominantly in  posterior head regions, symmetric and reactive to eye opening and eye closing. EEG showed continuous left temporal 3 to 6 Hz theta-delta slowing as well as intermittent generalized 3 to 6 Hz theta-delta slowing. Hyperventilation and photic stimulation were not performed.   ABNORMALITY -Continuous slow, left temporal IMPRESSION: This study is suggestive of cortical dysfunction in left temporal region secondary to underlying structural abnormality/stroke. Additionally there is mild diffuse encephalopathy, nonspecific etiology. No seizures or epileptiform discharges were seen throughout the recording. Priyanka O Yadav   VAS [place] GROIN PSEUDOANEURYSM  Result Date: 05/05/2020  ARTERIAL PSEUDOANEURYSM  Exam: Right groin Indications: Patient complains of groin pain. History: Status post left carotid arterigram follow by emblomization left tMCA aneurysm. Comparison Study: No prior. Performing Technologist: Oda Cogan RDMS, RVT  Examination Guidelines: A complete evaluation includes B-mode imaging, spectral Doppler, color Doppler, and power Doppler as needed of all accessible portions of each vessel. Bilateral testing is considered an integral part of a complete examination. Limited examinations for reoccurring indications may be performed as noted. +------------+----------+--------+------+----------+ Right DuplexPSV (cm/s)WaveformPlaqueComment(s) +------------+----------+--------+------+----------+ CFA            116                    Patent   +------------+----------+--------+------+----------+ Prox SFA       188                    Patent   +------------+----------+--------+------+----------+ Right Vein comments:Patent right common femoral vein.  Summary: No evidence of right groin pseudoaneurysm or AVF.  Diagnosing physician: Harold Barban MD Electronically signed by Harold Barban MD on 05/05/2020 at 62:06:38 PM.   --------------------------------------------------------------------------------     Final    CT ANGIO HEAD CODE STROKE  Result Date: 05/04/2020 CLINICAL DATA:  Stroke suspected. EXAM: CT ANGIOGRAPHY HEAD AND NECK TECHNIQUE: Multidetector CT imaging of the head and neck was performed using the standard protocol during bolus administration of intravenous contrast. Multiplanar CT image reconstructions and MIPs were obtained to evaluate the vascular anatomy. Carotid stenosis measurements (when applicable) are obtained utilizing NASCET criteria, using the distal internal carotid diameter as the denominator. CONTRAST:  77m OMNIPAQUE IOHEXOL 350 MG/ML SOLN COMPARISON:  MRA November 20, 2019. CT chest June 15 21. FINDINGS: CTA NECK FINDINGS Aortic arch: Great vessel origins are patent. Right carotid system: No evidence of dissection, stenosis (50% or greater) or occlusion. Atherosclerosis at the bifurcation. Left carotid system: No evidence of dissection, stenosis (50% or greater) or occlusion. Atherosclerosis at the bifurcation. Vertebral arteries: Codominant. No evidence of dissection, stenosis (50% or greater) or occlusion. Skeleton: No acute findings. Other neck: No mass or suspicious adenopathy. Upper chest: Small bilateral pleural effusions.  Patchy opacities in the lung apices may represent aspiration and/or pneumonia. Review of the MIP images confirms the above findings CTA HEAD FINDINGS Anterior circulation: No significant proximal stenosis or large vessel occlusion. Status post treatment of a left MCA aneurysm with web flow disrupted device. There is some expected flow within the aneurysm sac. Approximately 2 mm left posterior communicating artery aneurysm (see series 7, image 109). Distal left M2/M3 MCA branch stenosis was better characterized on the prior MRA. Posterior circulation: No significant stenosis, proximal occlusion, aneurysm, or vascular malformation. Mild stenosis of the basilar artery. Venous sinuses: As permitted by contrast timing, patent. Review of the MIP images confirms the  above findings IMPRESSION: 1. No large vessel occlusion or new hemodynamically significant proximal stenosis in the head or neck. 2. Status post treatment of a left MCA aneurysm with web flow disrupted device. There is some expected flow within the aneurysm sac. 3. 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 4. Small bilateral pleural effusions. Patchy opacities in the lung apices may represent aspiration and/or pneumonia. Additionally, there appears to be a new 1 cm subpleural nodular opacity in the imaged lateral left upper lobe that is new since CT chest from October 29, 2019. While this most likely represents an infectious/inflammatory process, recommend follow-chest CT when the patient is able to exclude pulmonary nodule. Critical findings discussed with Dr. Marjory Lies at 2:24 PM and Dr. Leonie Man at 2:45 PM via telephone. Lung findings discussed with Dr. Leonie Man at 3:00 PM. Electronically Signed   By: Margaretha Sheffield MD   On: 05/04/2020 15:05   CT ANGIO NECK CODE STROKE  Result Date: 05/04/2020 CLINICAL DATA:  Stroke suspected. EXAM: CT ANGIOGRAPHY HEAD AND NECK TECHNIQUE: Multidetector CT imaging of the head and neck was performed using the standard protocol during bolus administration of intravenous contrast. Multiplanar CT image reconstructions and MIPs were obtained to evaluate the vascular anatomy. Carotid stenosis measurements (when applicable) are obtained utilizing NASCET criteria, using the distal internal carotid diameter as the denominator. CONTRAST:  29m OMNIPAQUE IOHEXOL 350 MG/ML SOLN COMPARISON:  MRA November 20, 2019. CT chest June 15 21. FINDINGS: CTA NECK FINDINGS Aortic arch: Great vessel origins are patent. Right carotid system: No evidence of dissection, stenosis (50% or greater) or occlusion. Atherosclerosis at the bifurcation. Left carotid system: No evidence of dissection, stenosis (50% or  greater) or occlusion. Atherosclerosis at the bifurcation. Vertebral arteries: Codominant. No evidence of dissection, stenosis (50% or greater) or occlusion. Skeleton: No acute findings. Other neck: No mass or suspicious adenopathy. Upper chest: Small bilateral pleural effusions. Patchy opacities in the lung apices may represent aspiration and/or pneumonia. Review of the MIP images confirms the above findings CTA HEAD FINDINGS Anterior circulation: No significant proximal stenosis or large vessel occlusion. Status post treatment of a left MCA aneurysm with web flow disrupted device. There is some expected flow within the aneurysm sac. Approximately 2 mm left posterior communicating artery aneurysm (see series 7, image 109). Distal left M2/M3 MCA branch stenosis was better characterized on the prior MRA. Posterior circulation: No significant stenosis, proximal occlusion, aneurysm, or vascular malformation. Mild stenosis of the basilar artery. Venous sinuses: As permitted by contrast timing, patent. Review of the MIP images confirms the above findings IMPRESSION: 1. No large vessel occlusion or new hemodynamically significant proximal stenosis in the head or neck. 2. Status post treatment of a left MCA aneurysm with web flow disrupted device. There is some expected  flow within the aneurysm sac. 3. 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 4. Small bilateral pleural effusions. Patchy opacities in the lung apices may represent aspiration and/or pneumonia. Additionally, there appears to be a new 1 cm subpleural nodular opacity in the imaged lateral left upper lobe that is new since CT chest from October 29, 2019. While this most likely represents an infectious/inflammatory process, recommend follow-chest CT when the patient is able to exclude pulmonary nodule. Critical findings discussed with Dr. Marjory Lies at 2:24 PM  and Dr. Leonie Man at 2:45 PM via telephone. Lung findings discussed with Dr. Leonie Man at 3:00 PM. Electronically Signed   By: Margaretha Sheffield MD   On: 05/04/2020 15:05     Assessment/Plan: Diagnosis: Brain aneurysm 1. Does the need for close, 24 hr/day medical supervision in concert with the patient's rehab needs make it unreasonable for this patient to be served in a less intensive setting? Yes 2. Co-Morbidities requiring supervision/potential complications: iron deficiency anemia, obesity (BMI 30.01), GI AVM, CKD III, chronic headaches 3. Due to bladder management, bowel management, safety, skin/wound care, disease management, medication administration, pain management and patient education, does the patient require 24 hr/day rehab nursing? Yes 4. Does the patient require coordinated care of a physician, rehab nurse, therapy disciplines of PT, OT to address physical and functional deficits in the context of the above medical diagnosis(es)? Yes Addressing deficits in the following areas: balance, endurance, locomotion, strength, transferring, bowel/bladder control, bathing, dressing, feeding, grooming, toileting and psychosocial support 5. Can the patient actively participate in an intensive therapy program of at least 3 hrs of therapy per day at least 5 days per week? Yes 6. The potential for patient to make measurable gains while on inpatient rehab is excellent 7. Anticipated functional outcomes upon discharge from inpatient rehab are modified independent  with PT, modified independent with OT, independent with SLP. 8. Estimated rehab length of stay to reach the above functional goals is: 5-7 days 9. Anticipated discharge destination: Home 10. Overall Rehab/Functional Prognosis: excellent  RECOMMENDATIONS: This patient's condition is appropriate for continued rehabilitative care in the following setting: CIR Patient has agreed to participate in recommended program. Yes Note that insurance prior  authorization may be required for reimbursement for recommended care.  Comment:  1) Brain aneurysm: patient would be an excellent CIR candidate. AC to follow. 2) Torticollis: may benefit from heating pad and baclofen scheduled TID 3) Headache: may benefit from baclofen as above. If this is not enough, can consider adding Topamax 40m. 4) Impaired mobility and ADLs: patient would benefit from daily acute OT and PT until CIR admission  Thank you for this consult. Admission coordinator to follow.   I have personally performed a face to face diagnostic evaluation, including, but not limited to relevant history and physical exam findings, of this patient and developed relevant assessment and plan.  Additionally, I have reviewed and concur with the physician assistant's documentation above.  KLeeroy Cha MD  PBary Leriche PA-C 05/06/2020

## 2020-05-06 NOTE — Progress Notes (Signed)
Referring Physician(s): Antony Contras (neurology)  Supervising Physician: Luanne Bras  Patient Status:  [name] - In-pt  Chief Complaint: "I want to go home"  Subjective:  Left MCA aneurysm s/p endovascular embolization using WEB device via right femoral approach 05/04/2020 by Dr. Estanislado Pandy. Patient awake and alert sitting in chair eating lunch. Complains of intermittent right neck pain, chronic and stable. Asking to go home. Discussed PT assessment with patient and their recommendations regarding possible CIR. Patient upset she might have to stay in house longer but understands it is for her safety. Right femoral puncture site c/d/i.   Allergies: Contrast media [iodinated diagnostic agents]  Medications: Prior to Admission medications   Medication Sig Start Date End Date Taking? Authorizing Provider  allopurinol (ZYLOPRIM) 100 MG tablet Take 100 mg by mouth at bedtime.   Yes [provider]  amLODipine (NORVASC) 10 MG tablet Take 10 mg by mouth daily.   Yes [provider]  APPLE CIDER VINEGAR PO Take 1 tablet by mouth daily.   Yes [provider]  aspirin EC 81 MG EC tablet Take 1 tablet (81 mg total) by mouth daily. Swallow whole. 11/22/19  Yes Danford, Suann Larry, MD  atorvastatin (LIPITOR) 80 MG tablet Take 1 tablet (80 mg total) by mouth daily. 11/22/19  Yes Danford, Suann Larry, MD  baclofen (LIORESAL) 10 MG tablet Take 10 mg by mouth 3 (three) times daily as needed for muscle spasms.   Yes [provider]  buprenorphine (BUTRANS) 20 MCG/HR PTWK Place 1 patch onto the skin every Monday.  11/15/19  Yes [provider]  clopidogrel (PLAVIX) 75 MG tablet Take 75 mg by mouth daily.   Yes [provider]  Dulaglutide (TRULICITY) 1.5 CB/4.4HQ SOPN Inject 1.5 mg into the skin every Monday.    Yes [provider]  ezetimibe (ZETIA) 10 MG tablet Take 10 mg by mouth daily.   Yes [provider]  gabapentin  (NEURONTIN) 600 MG tablet Take 600 mg by mouth 4 (four) times daily.    Yes [provider]  insulin glargine, 1 Unit Dial, (TOUJEO SOLOSTAR) 300 UNIT/ML Solostar Pen Inject 20 Units into the skin at bedtime.    Yes [provider]  Ipratropium-Albuterol (COMBIVENT) 20-100 MCG/ACT AERS respimat Inhale 1 puff into the lungs every 6 (six) hours as needed for wheezing or shortness of breath. 03/17/17  Yes Hongalgi, Lenis Dickinson, MD  levothyroxine (SYNTHROID) 75 MCG tablet Take 75 mcg by mouth daily. 02/28/19  Yes [provider]  losartan (COZAAR) 25 MG tablet Take 1 tablet (25 mg total) by mouth daily. 11/22/19 11/21/20 Yes Danford, Suann Larry, MD  metoprolol (LOPRESSOR) 50 MG tablet Take 50 mg by mouth 2 (two) times daily.  03/06/14  Yes [provider]  Multiple Vitamin (MULTIVITAMIN WITH MINERALS) TABS tablet Take 1 tablet by mouth daily.   Yes [provider]  oxyCODONE-acetaminophen (PERCOCET) 10-325 MG tablet Take 1 tablet by mouth every 6 (six) hours as needed (breakthrough pain).    Yes [provider]  pregabalin (LYRICA) 25 MG capsule Take 25 mg by mouth 2 (two) times daily.  01/24/19  Yes [provider]  sennosides-docusate sodium (SENOKOT-S) 8.6-50 MG tablet Take 1 tablet by mouth at bedtime.   Yes [provider]  Vitamin D, Ergocalciferol, (DRISDOL) 1.25 MG (50000 UNIT) CAPS capsule Take 50,000 Units by mouth every Monday.    Yes [provider]  XTAMPZA ER 27 MG C12A Take 27 mg by mouth  2 (two) times daily.  02/27/19  Yes [provider]  feeding supplement, ENSURE ENLIVE, (ENSURE ENLIVE) LIQD Take 237 mLs by mouth 2 (two) times daily between meals. 03/30/19   Bonnell Public, MD  furosemide (LASIX) 40 MG tablet Take 1 tablet (40 mg total) by mouth daily as needed. Patient taking differently: Take 40 mg by mouth daily as needed for fluid. 05/20/19 05/19/20  O'NealCassie Freer, MD  lidocaine-prilocaine  (EMLA) cream Apply 1 application topically as needed. Patient taking differently: Apply 1 application topically daily as needed (port access). 07/03/19   Truitt Merle, MD  [facility] 4 MG/0.1ML LIQD nasal spray kit Place 1 spray into the nose as needed for opioid reversal. 08/22/19   [provider]  pantoprazole (PROTONIX) 40 MG tablet Take 40 mg by mouth every morning.    [provider]     Vital Signs: BP (!) 149/54   Pulse (!) 50   Temp 98.4 F (36.9 C) (Oral)   Resp (!) 9   Ht 5' 5" (1.651 m)   Wt 180 lb 5.4 oz (81.8 kg)   SpO2 100%   BMI 30.01 kg/m   Physical Exam Vitals and nursing note reviewed.  Constitutional:      General: She is not in acute distress.    Appearance: Normal appearance.  Cardiovascular:     Rate and Rhythm: Normal rate and regular rhythm.  Pulmonary:     Effort: Pulmonary effort is normal. No respiratory distress.     Breath sounds: Normal breath sounds. No wheezing.  Skin:    General: Skin is warm and dry.     Comments: Right femoral puncture site soft with mild tenderness, no active bleeding or hematoma noted.  Neurological:     Mental Status: She is alert and oriented to person, place, and time.     Comments: Moving all extremities. Speech clear. No pronator drift.     Imaging: MR BRAIN WO CONTRAST  Result Date: 05/05/2020 CLINICAL DATA:  Initial evaluation for speech abnormality following recent aneurysm repair. EXAM: MRI HEAD WITHOUT CONTRAST TECHNIQUE: Multiplanar, multiecho pulse sequences of the brain and surrounding structures were obtained without intravenous contrast. COMPARISON:  Previous CT from 05/04/2020 and MRI from 11/19/2019. FINDINGS: Brain: Cerebral volume within normal limits for age. Mild scattered chronic microvascular ischemic changes noted involving the supratentorial cerebral white matter and pons. Involving encephalomalacia of with laminar necrosis seen at the posterior left temporal region related to the  previously identified infarct. Associated susceptibility artifact related to laminar necrosis and/or chronic hemosiderin staining. No other abnormal foci of restricted diffusion to suggest acute or subacute ischemia. Gray-white matter differentiation otherwise maintained. No other evidence for acute or chronic intracranial hemorrhage. No mass lesion, midline shift or mass effect. No hydrocephalus or extra-axial fluid collection. Pituitary gland suprasellar region normal. Midline structures intact. Vascular: Major intracranial vascular flow voids are maintained. Skull and upper cervical spine: Craniocervical junction within normal limits. Diffusely decreased T1 signal intensity seen throughout the visualized bone marrow, nonspecific, but most commonly related to anemia, smoking, or obesity. No scalp soft tissue abnormality. Sinuses/Orbits: Patient status post bilateral ocular lens replacement. Globes and orbital soft tissues demonstrate no acute finding. Paranasal sinuses are largely clear. Right mastoid effusion noted. Visualized nasopharynx within normal limits. Other: None. IMPRESSION: 1. No acute intracranial infarct or other abnormality. 2. Evolving encephalomalacia with laminar necrosis involving the posterior left temporal region related to previously identified left MCA territory infarct. 3. Underlying mild chronic microvascular ischemic  disease. 4. Right mastoid effusion. Electronically Signed   By: Jeannine Boga M.D.   On: 05/05/2020 01:36   IR Angiogram Selective Each Additional Vessel  Result Date: 05/06/2020 CLINICAL DATA:  Patient with headaches and multiple intracranial aneurysms. Patient admitted for unruptured left middle cerebral artery aneurysm. EXAM: TRANSCATHETER THERAPY EMBOLIZATION COMPARISON:  Diagnostic catheter arteriogram of February 06, 2020. MEDICATIONS: Heparin 3,000 units IV. Ancef 2 g IV antibiotic was administered within 1 hour. ANESTHESIA/SEDATION: General anesthesia  CONTRAST:  Isovue 300 approximately 120 mL FLUOROSCOPY TIME:  Fluoroscopy Time: 35 minutes 18 seconds (1457 mGy). COMPLICATIONS: None immediate. TECHNIQUE: Informed written consent was obtained from the patient after a thorough discussion of the procedural risks, benefits and alternatives. All questions were addressed. Maximal Sterile Barrier Technique was utilized including caps, mask, sterile gowns, sterile gloves, sterile drape, hand hygiene and skin antiseptic. A timeout was performed prior to the initiation of the procedure. Initially a radial access was obtained with ultrasound guidance after having documented the right radial artery morphology with ultrasound. However, due to persistent vaso spasm induced by the micro guidewire, this approach was abandoned. The right groin was prepped and draped in the usual sterile fashion. Thereafter using modified Seldinger technique, transfemoral access into the right common femoral artery was obtained without difficulty. Over a 0.035 inch guidewire, an 8 [place] 25 cm Pinnacle sheath was inserted. Through this, and also over 0.035 inch guidewire, a 5 [place] JB 1 catheter was advanced to the aortic arch region and selectively positioned in the left common carotid artery. FINDINGS:.: FINDINGS:. The left common carotid arteriogram demonstrates the left external carotid artery and its major branches to be widely patent. The left internal carotid artery at the bulb to the cranial skull base is also widely patent. Patency is maintained into the petrous, cavernous and supraclinoid segments. Again demonstrated are 2 small saccular outpouchings projecting posteriorly from the supraclinoid left ICA, one in the region of the origin of the anterior choroidal artery. The left middle cerebral artery and the left anterior cerebral artery opacify into the capillary and venous phases. Prompt filling via the anterior communicating artery of the right anterior cerebral A2 segment is  seen. Arising at the bifurcation of the left middle cerebral artery again noted is a saccular aneurysm with a relatively wide neck projecting slightly anteriorly. A 3D rotational arteriogram was then performed from the left common carotid artery extending intracranially. Reconstruction was then performed on a separate workstation. Optimal views were then obtained for treatment of the aneurysm. The aneurysm measured to be approximately 4.1 mm in its maximum width, and 3.82 mm in height. It was decided to use a WEB SL W5-4.3 for intra saccular disruption device for embolization. ENDOVASCULAR EMBOLIZATION OF LEFT MIDDLE CEREBRAL ARTERY BIFURCATION REGION ANEURYSM The diagnostic JB 1 catheter in the left common carotid artery was then exchanged over a 0.035 inch 300 cm Rosen exchange guidewire for a 90 cm 6 [place] Infinity sheath. This was advanced to the bifurcation of the left common carotid artery. The guidewire was removed. Good aspiration obtained from the hub of the Infinity sheath. A gentle control arteriogram performed through this demonstrated no evidence of vasospasm or of intraluminal filling defects. This was then connected to continuous heparinized saline infusion. Through the Infinity sheath, a combination of a 6 [place] 115 cm intermediary catheter inside of which was a Via 17 microcatheter was advanced over a 0.014 inch standard Synchro micro guidewire with a J configuration to the proximal cavernous segment.  At this time, the Infinity guide sheath was advanced to the junction of the cervical petrous junction. Using biplane roadmap technique and constant fluoroscopic guidance, in a coaxial manner and with constant heparinized saline infusion, the micro guidewire was then gently advanced with a J-tip configuration to the left middle cerebral artery followed by the microcatheter. The micro guidewire was then gently advanced into the aneurysm followed by the microcatheter. The guidewire was removed.  Good aspiration obtained from the hub of the microcatheter. This was then connected to continuous heparinized saline infusion. The WEB SL W5-4.3 intra saccular flow disrupter was then prepped and purged in heparinized saline. It was cleared of any air bubbles. This was then recaptured into the housing catheter. This was then loaded onto the microcatheter after heparin flushed in the Tuohy Borst. Smooth advancement was obtained to the middle cerebral artery very slowly ensuring no forward motion or movements of the microcatheter which was just inside the neck of the aneurysm. The distal marker on the WEB device was advanced to the distal marker on the microcatheter. The entire system was then straightened. With the microcatheter stable, the WEB device was advanced until early formation of a [place] configuration. A control arteriogram performed through the [facility] guide catheter demonstrated safe position and orientation of the device within the aneurysm. The microcatheter was then gently advanced further into the aneurysm followed by advancement of the WEB device into the aneurysm. Complete opening of the device was noted. A control arteriogram performed through the [facility] guide catheter demonstrated complete opening and apposition of the device into the aneurysm. The superior and inferior branches of the left middle cerebral artery were patent without evidence of encroachment on the bifurcation branches. With the WEB device well oriented in the AP and lateral projections, the microcatheter was gently withdrawn to just proximal to the proximal marker on the WEB device. Again the orientation of the WEB device within the aneurysm remained unchanged. A control arteriogram performed through the [facility] guide catheter demonstrated excellent apposition on the AP and lateral projections with contrast stasis within the aneurysm. Under constant biplane fluoroscopy, the device was disconnected and deployed using the detachment  device at the proximal aspect of the delivery wire. The micro guidewire was gently advanced just distal to the microcatheter then withdrawn. A control arteriogram was then performed through the [facility] guide catheter in the left internal carotid artery which continued to demonstrate excellent flow through the left middle cerebral artery distribution with patency of the superior and inferior divisions, and stasis within the aneurysm itself. Control arteriograms were then performed at 10 and 20 minutes post deployment of the device. These continued to demonstrated no evidence of intraluminal filling defects, or of occlusions in the left middle cerebral artery and the left anterior cerebral artery distributions. The [place] 6 [place] guide catheter was removed. The Infinity sheath was then retrieved to just proximal to the left common carotid artery bifurcation. Control arteriogram performed through this continued to demonstrate wide patency of the of the left internal carotid artery proximally and distally with no change in the intra saccular device within left middle cerebral artery aneurysm. The Infinity sheath was removed. The 8 [place] Pinnacle sheath was then removed with the successful application of an 8 [place] Angio-Seal closure device for hemostasis. Distal pulses remained palpable in both feet unchanged. [name] ACT was in the region of approximately 250. This was partially reversed with 5 mg of protamine sulfate. [name] general anesthesia was slowly reversed. She was  able to follow simple commands following the extubation. She was transferred to PACU for post extubation management. 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 PLAN: Patient to be left on aspirin 81 mg a day and Plavix 75 mg per day for 2 weeks. Follow-up in the clinic 2 weeks post discharge. Electronically Signed   By: Luanne Bras M.D.   On: 05/05/2020 09:52   IR Transcath/Emboliz  Result Date: 05/06/2020 CLINICAL DATA:  Patient with headaches and multiple intracranial aneurysms. Patient admitted for unruptured left middle cerebral artery aneurysm. EXAM: TRANSCATHETER THERAPY EMBOLIZATION COMPARISON:  Diagnostic catheter arteriogram of February 06, 2020. MEDICATIONS: Heparin 3,000 units IV. Ancef 2 g IV antibiotic was administered within 1 hour. ANESTHESIA/SEDATION: General anesthesia CONTRAST:  Isovue 300 approximately 120 mL FLUOROSCOPY TIME:  Fluoroscopy Time: 35 minutes 18 seconds (1457 mGy). COMPLICATIONS: None immediate. TECHNIQUE: Informed written consent was obtained from the patient after a thorough discussion of the procedural risks, benefits and alternatives. All questions were addressed. Maximal Sterile Barrier Technique was utilized including caps, mask, sterile gowns, sterile gloves, sterile drape, hand hygiene and skin antiseptic. A timeout was performed prior to the initiation of the procedure. Initially a radial access was obtained with ultrasound guidance after having documented the right radial artery morphology with ultrasound. However, due to persistent vaso spasm induced by the micro guidewire, this approach was abandoned. The right groin was prepped and draped in the usual sterile fashion. Thereafter using modified Seldinger technique, transfemoral access into the right common femoral artery was obtained without difficulty. Over a 0.035 inch guidewire, an 8 [place] 25 cm Pinnacle sheath was inserted. Through this, and also over 0.035 inch guidewire, a 5 [place] JB 1 catheter was advanced to the aortic arch region and selectively positioned in the left common carotid artery. FINDINGS:.: FINDINGS:. The left common carotid arteriogram demonstrates the left external carotid artery and its major branches to be widely patent. The left internal carotid artery at the bulb to the cranial skull base is also  widely patent. Patency is maintained into the petrous, cavernous and supraclinoid segments. Again demonstrated are 2 small saccular outpouchings projecting posteriorly from the supraclinoid left ICA, one in the region of the origin of the anterior choroidal artery. The left middle cerebral artery and the left anterior cerebral artery opacify into the capillary and venous phases. Prompt filling via the anterior communicating artery of the right anterior cerebral A2 segment is seen. Arising at the bifurcation of the left middle cerebral artery again noted is a saccular aneurysm with a relatively wide neck projecting slightly anteriorly. A 3D rotational arteriogram was then performed from the left common carotid artery extending intracranially. Reconstruction was then performed on a separate workstation. Optimal views were then obtained for treatment of the aneurysm. The aneurysm measured to be approximately 4.1 mm in its maximum width, and 3.82 mm in height. It was decided to use a WEB SL W5-4.3 for intra saccular disruption device for embolization. ENDOVASCULAR EMBOLIZATION OF LEFT MIDDLE CEREBRAL ARTERY BIFURCATION REGION ANEURYSM The diagnostic JB 1 catheter in the left common carotid artery was then exchanged over a 0.035 inch 300 cm Rosen exchange guidewire for a 90 cm 6 [place] Infinity sheath. This was advanced to the bifurcation of the left common carotid artery. The guidewire was removed. Good aspiration obtained from the hub of the Infinity sheath. A gentle control arteriogram performed through this demonstrated no evidence of  vasospasm or of intraluminal filling defects. This was then connected to continuous heparinized saline infusion. Through the Infinity sheath, a combination of a 6 [place] 115 cm intermediary catheter inside of which was a Via 17 microcatheter was advanced over a 0.014 inch standard Synchro micro guidewire with a J configuration to the proximal cavernous segment. At this time, the  Infinity guide sheath was advanced to the junction of the cervical petrous junction. Using biplane roadmap technique and constant fluoroscopic guidance, in a coaxial manner and with constant heparinized saline infusion, the micro guidewire was then gently advanced with a J-tip configuration to the left middle cerebral artery followed by the microcatheter. The micro guidewire was then gently advanced into the aneurysm followed by the microcatheter. The guidewire was removed. Good aspiration obtained from the hub of the microcatheter. This was then connected to continuous heparinized saline infusion. The WEB SL W5-4.3 intra saccular flow disrupter was then prepped and purged in heparinized saline. It was cleared of any air bubbles. This was then recaptured into the housing catheter. This was then loaded onto the microcatheter after heparin flushed in the Tuohy Borst. Smooth advancement was obtained to the middle cerebral artery very slowly ensuring no forward motion or movements of the microcatheter which was just inside the neck of the aneurysm. The distal marker on the WEB device was advanced to the distal marker on the microcatheter. The entire system was then straightened. With the microcatheter stable, the WEB device was advanced until early formation of a [place] configuration. A control arteriogram performed through the [facility] guide catheter demonstrated safe position and orientation of the device within the aneurysm. The microcatheter was then gently advanced further into the aneurysm followed by advancement of the WEB device into the aneurysm. Complete opening of the device was noted. A control arteriogram performed through the [facility] guide catheter demonstrated complete opening and apposition of the device into the aneurysm. The superior and inferior branches of the left middle cerebral artery were patent without evidence of encroachment on the bifurcation branches. With the WEB device well oriented in the AP  and lateral projections, the microcatheter was gently withdrawn to just proximal to the proximal marker on the WEB device. Again the orientation of the WEB device within the aneurysm remained unchanged. A control arteriogram performed through the [facility] guide catheter demonstrated excellent apposition on the AP and lateral projections with contrast stasis within the aneurysm. Under constant biplane fluoroscopy, the device was disconnected and deployed using the detachment device at the proximal aspect of the delivery wire. The micro guidewire was gently advanced just distal to the microcatheter then withdrawn. A control arteriogram was then performed through the [facility] guide catheter in the left internal carotid artery which continued to demonstrate excellent flow through the left middle cerebral artery distribution with patency of the superior and inferior divisions, and stasis within the aneurysm itself. Control arteriograms were then performed at 10 and 20 minutes post deployment of the device. These continued to demonstrated no evidence of intraluminal filling defects, or of occlusions in the left middle cerebral artery and the left anterior cerebral artery distributions. The [place] 6 [place] guide catheter was removed. The Infinity sheath was then retrieved to just proximal to the left common carotid artery bifurcation. Control arteriogram performed through this continued to demonstrate wide patency of the of the left internal carotid artery proximally and distally with no change in the intra saccular device within left middle cerebral artery aneurysm. The Infinity sheath was removed. The  [place] sheath was then removed with the successful application of an 8 [place] Angio-Seal closure device for hemostasis. Distal pulses remained palpable in both feet unchanged. [name] ACT was in the region of approximately 250. This was partially reversed with 5 mg of protamine sulfate. [name] general  anesthesia was slowly reversed. She was able to follow simple commands following the extubation. She was transferred to PACU for post extubation management. 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 PLAN: Patient to be left on aspirin 81 mg a day and Plavix 75 mg per day for 2 weeks. Follow-up in the clinic 2 weeks post discharge. Electronically Signed   By: Luanne Bras M.D.   On: 05/05/2020 09:52   IR Angiogram Follow Up Study  Result Date: 05/06/2020 CLINICAL DATA:  Patient with headaches and multiple intracranial aneurysms. Patient admitted for unruptured left middle cerebral artery aneurysm. EXAM: TRANSCATHETER THERAPY EMBOLIZATION COMPARISON:  Diagnostic catheter arteriogram of February 06, 2020. MEDICATIONS: Heparin 3,000 units IV. Ancef 2 g IV antibiotic was administered within 1 hour. ANESTHESIA/SEDATION: General anesthesia CONTRAST:  Isovue 300 approximately 120 mL FLUOROSCOPY TIME:  Fluoroscopy Time: 35 minutes 18 seconds (1457 mGy). COMPLICATIONS: None immediate. TECHNIQUE: Informed written consent was obtained from the patient after a thorough discussion of the procedural risks, benefits and alternatives. All questions were addressed. Maximal Sterile Barrier Technique was utilized including caps, mask, sterile gowns, sterile gloves, sterile drape, hand hygiene and skin antiseptic. A timeout was performed prior to the initiation of the procedure. Initially a radial access was obtained with ultrasound guidance after having documented the right radial artery morphology with ultrasound. However, due to persistent vaso spasm induced by the micro guidewire, this approach was abandoned. The right groin was prepped and draped in the usual sterile fashion. Thereafter using modified Seldinger technique, transfemoral access into the right common femoral artery was obtained without difficulty.  Over a 0.035 inch guidewire, an 8 [place] 25 cm Pinnacle sheath was inserted. Through this, and also over 0.035 inch guidewire, a 5 [place] JB 1 catheter was advanced to the aortic arch region and selectively positioned in the left common carotid artery. FINDINGS:.: FINDINGS:. The left common carotid arteriogram demonstrates the left external carotid artery and its major branches to be widely patent. The left internal carotid artery at the bulb to the cranial skull base is also widely patent. Patency is maintained into the petrous, cavernous and supraclinoid segments. Again demonstrated are 2 small saccular outpouchings projecting posteriorly from the supraclinoid left ICA, one in the region of the origin of the anterior choroidal artery. The left middle cerebral artery and the left anterior cerebral artery opacify into the capillary and venous phases. Prompt filling via the anterior communicating artery of the right anterior cerebral A2 segment is seen. Arising at the bifurcation of the left middle cerebral artery again noted is a saccular aneurysm with a relatively wide neck projecting slightly anteriorly. A 3D rotational arteriogram was then performed from the left common carotid artery extending intracranially. Reconstruction was then performed on a separate workstation. Optimal views were then obtained for treatment of the aneurysm. The aneurysm measured to be approximately 4.1 mm in its maximum width, and 3.82 mm in height. It was decided to use a WEB SL W5-4.3 for intra saccular disruption device for embolization. ENDOVASCULAR EMBOLIZATION OF LEFT MIDDLE CEREBRAL ARTERY BIFURCATION REGION ANEURYSM The diagnostic JB 1 catheter in the left  common carotid artery was then exchanged over a 0.035 inch 300 cm Rosen exchange guidewire for a 90 cm 6 [place] Infinity sheath. This was advanced to the bifurcation of the left common carotid artery. The guidewire was removed. Good aspiration obtained from the hub of  the Infinity sheath. A gentle control arteriogram performed through this demonstrated no evidence of vasospasm or of intraluminal filling defects. This was then connected to continuous heparinized saline infusion. Through the Infinity sheath, a combination of a 6 [place] 115 cm intermediary catheter inside of which was a Via 17 microcatheter was advanced over a 0.014 inch standard Synchro micro guidewire with a J configuration to the proximal cavernous segment. At this time, the Infinity guide sheath was advanced to the junction of the cervical petrous junction. Using biplane roadmap technique and constant fluoroscopic guidance, in a coaxial manner and with constant heparinized saline infusion, the micro guidewire was then gently advanced with a J-tip configuration to the left middle cerebral artery followed by the microcatheter. The micro guidewire was then gently advanced into the aneurysm followed by the microcatheter. The guidewire was removed. Good aspiration obtained from the hub of the microcatheter. This was then connected to continuous heparinized saline infusion. The WEB SL W5-4.3 intra saccular flow disrupter was then prepped and purged in heparinized saline. It was cleared of any air bubbles. This was then recaptured into the housing catheter. This was then loaded onto the microcatheter after heparin flushed in the Tuohy Borst. Smooth advancement was obtained to the middle cerebral artery very slowly ensuring no forward motion or movements of the microcatheter which was just inside the neck of the aneurysm. The distal marker on the WEB device was advanced to the distal marker on the microcatheter. The entire system was then straightened. With the microcatheter stable, the WEB device was advanced until early formation of a Tulip configuration. A control arteriogram performed through the [facility] guide catheter demonstrated safe position and orientation of the device within the aneurysm. The  microcatheter was then gently advanced further into the aneurysm followed by advancement of the WEB device into the aneurysm. Complete opening of the device was noted. A control arteriogram performed through the [facility] guide catheter demonstrated complete opening and apposition of the device into the aneurysm. The superior and inferior branches of the left middle cerebral artery were patent without evidence of encroachment on the bifurcation branches. With the WEB device well oriented in the AP and lateral projections, the microcatheter was gently withdrawn to just proximal to the proximal marker on the WEB device. Again the orientation of the WEB device within the aneurysm remained unchanged. A control arteriogram performed through the [facility] guide catheter demonstrated excellent apposition on the AP and lateral projections with contrast stasis within the aneurysm. Under constant biplane fluoroscopy, the device was disconnected and deployed using the detachment device at the proximal aspect of the delivery wire. The micro guidewire was gently advanced just distal to the microcatheter then withdrawn. A control arteriogram was then performed through the [facility] guide catheter in the left internal carotid artery which continued to demonstrate excellent flow through the left middle cerebral artery distribution with patency of the superior and inferior divisions, and stasis within the aneurysm itself. Control arteriograms were then performed at 10 and 20 minutes post deployment of the device. These continued to demonstrated no evidence of intraluminal filling defects, or of occlusions in the left middle cerebral artery and the left anterior cerebral artery distributions. The [place] 6 [place] guide  catheter was removed. The Infinity sheath was then retrieved to just proximal to the left common carotid artery bifurcation. Control arteriogram performed through this continued to demonstrate wide patency of the of the left  internal carotid artery proximally and distally with no change in the intra saccular device within left middle cerebral artery aneurysm. The Infinity sheath was removed. The 8 French Pinnacle sheath was then removed with the successful application of an 8 French Angio-Seal closure device for hemostasis. Distal pulses remained palpable in both feet unchanged. [name] ACT was in the region of approximately 250. This was partially reversed with 5 mg of protamine sulfate. [name] general anesthesia was slowly reversed. She was able to follow simple commands following the extubation. She was transferred to PACU for post extubation management. 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 PLAN: Patient to be left on aspirin 81 mg a day and Plavix 75 mg per day for 2 weeks. Follow-up in the clinic 2 weeks post discharge. Electronically Signed   By: Luanne Bras M.D.   On: 05/05/2020 09:52   IR 3D Independent Darreld Mclean  Result Date: 05/06/2020 CLINICAL DATA:  Patient with headaches and multiple intracranial aneurysms. Patient admitted for unruptured left middle cerebral artery aneurysm. EXAM: TRANSCATHETER THERAPY EMBOLIZATION COMPARISON:  Diagnostic catheter arteriogram of February 06, 2020. MEDICATIONS: Heparin 3,000 units IV. Ancef 2 g IV antibiotic was administered within 1 hour. ANESTHESIA/SEDATION: General anesthesia CONTRAST:  Isovue 300 approximately 120 mL FLUOROSCOPY TIME:  Fluoroscopy Time: 35 minutes 18 seconds (1457 mGy). COMPLICATIONS: None immediate. TECHNIQUE: Informed written consent was obtained from the patient after a thorough discussion of the procedural risks, benefits and alternatives. All questions were addressed. Maximal Sterile Barrier Technique was utilized including caps, mask, sterile gowns, sterile gloves, sterile drape, hand hygiene and skin antiseptic. A timeout was  performed prior to the initiation of the procedure. Initially a radial access was obtained with ultrasound guidance after having documented the right radial artery morphology with ultrasound. However, due to persistent vaso spasm induced by the micro guidewire, this approach was abandoned. The right groin was prepped and draped in the usual sterile fashion. Thereafter using modified Seldinger technique, transfemoral access into the right common femoral artery was obtained without difficulty. Over a 0.035 inch guidewire, an 8 [place] 25 cm Pinnacle sheath was inserted. Through this, and also over 0.035 inch guidewire, a 5 [place] JB 1 catheter was advanced to the aortic arch region and selectively positioned in the left common carotid artery. FINDINGS:.: FINDINGS:. The left common carotid arteriogram demonstrates the left external carotid artery and its major branches to be widely patent. The left internal carotid artery at the bulb to the cranial skull base is also widely patent. Patency is maintained into the petrous, cavernous and supraclinoid segments. Again demonstrated are 2 small saccular outpouchings projecting posteriorly from the supraclinoid left ICA, one in the region of the origin of the anterior choroidal artery. The left middle cerebral artery and the left anterior cerebral artery opacify into the capillary and venous phases. Prompt filling via the anterior communicating artery of the right anterior cerebral A2 segment is seen. Arising at the bifurcation of the left middle cerebral artery again noted is a saccular aneurysm with a relatively wide neck projecting slightly anteriorly. A 3D rotational arteriogram was then performed from the left common carotid artery extending intracranially. Reconstruction was then performed on a separate workstation. Optimal views  were then obtained for treatment of the aneurysm. The aneurysm measured to be approximately 4.1 mm in its maximum width, and 3.82 mm in  height. It was decided to use a WEB SL W5-4.3 for intra saccular disruption device for embolization. ENDOVASCULAR EMBOLIZATION OF LEFT MIDDLE CEREBRAL ARTERY BIFURCATION REGION ANEURYSM The diagnostic JB 1 catheter in the left common carotid artery was then exchanged over a 0.035 inch 300 cm Rosen exchange guidewire for a 90 cm 6 [place] Infinity sheath. This was advanced to the bifurcation of the left common carotid artery. The guidewire was removed. Good aspiration obtained from the hub of the Infinity sheath. A gentle control arteriogram performed through this demonstrated no evidence of vasospasm or of intraluminal filling defects. This was then connected to continuous heparinized saline infusion. Through the Infinity sheath, a combination of a 6 [place] 115 cm intermediary catheter inside of which was a Via 17 microcatheter was advanced over a 0.014 inch standard Synchro micro guidewire with a J configuration to the proximal cavernous segment. At this time, the Infinity guide sheath was advanced to the junction of the cervical petrous junction. Using biplane roadmap technique and constant fluoroscopic guidance, in a coaxial manner and with constant heparinized saline infusion, the micro guidewire was then gently advanced with a J-tip configuration to the left middle cerebral artery followed by the microcatheter. The micro guidewire was then gently advanced into the aneurysm followed by the microcatheter. The guidewire was removed. Good aspiration obtained from the hub of the microcatheter. This was then connected to continuous heparinized saline infusion. The WEB SL W5-4.3 intra saccular flow disrupter was then prepped and purged in heparinized saline. It was cleared of any air bubbles. This was then recaptured into the housing catheter. This was then loaded onto the microcatheter after heparin flushed in the Tuohy Borst. Smooth advancement was obtained to the middle cerebral artery very slowly ensuring no  forward motion or movements of the microcatheter which was just inside the neck of the aneurysm. The distal marker on the WEB device was advanced to the distal marker on the microcatheter. The entire system was then straightened. With the microcatheter stable, the WEB device was advanced until early formation of a Tulip configuration. A control arteriogram performed through the [facility] guide catheter demonstrated safe position and orientation of the device within the aneurysm. The microcatheter was then gently advanced further into the aneurysm followed by advancement of the WEB device into the aneurysm. Complete opening of the device was noted. A control arteriogram performed through the [facility] guide catheter demonstrated complete opening and apposition of the device into the aneurysm. The superior and inferior branches of the left middle cerebral artery were patent without evidence of encroachment on the bifurcation branches. With the WEB device well oriented in the AP and lateral projections, the microcatheter was gently withdrawn to just proximal to the proximal marker on the WEB device. Again the orientation of the WEB device within the aneurysm remained unchanged. A control arteriogram performed through the [facility] guide catheter demonstrated excellent apposition on the AP and lateral projections with contrast stasis within the aneurysm. Under constant biplane fluoroscopy, the device was disconnected and deployed using the detachment device at the proximal aspect of the delivery wire. The micro guidewire was gently advanced just distal to the microcatheter then withdrawn. A control arteriogram was then performed through the [facility] guide catheter in the left internal carotid artery which continued to demonstrate excellent flow through the left middle cerebral artery distribution with  patency of the superior and inferior divisions, and stasis within the aneurysm itself. Control arteriograms were then performed at 10  and 20 minutes post deployment of the device. These continued to demonstrated no evidence of intraluminal filling defects, or of occlusions in the left middle cerebral artery and the left anterior cerebral artery distributions. The [place] 6 [place] guide catheter was removed. The Infinity sheath was then retrieved to just proximal to the left common carotid artery bifurcation. Control arteriogram performed through this continued to demonstrate wide patency of the of the left internal carotid artery proximally and distally with no change in the intra saccular device within left middle cerebral artery aneurysm. The Infinity sheath was removed. The 8 French Pinnacle sheath was then removed with the successful application of an 8 [place] Angio-Seal closure device for hemostasis. Distal pulses remained palpable in both feet unchanged. [name] ACT was in the region of approximately 250. This was partially reversed with 5 mg of protamine sulfate. [name] general anesthesia was slowly reversed. She was able to follow simple commands following the extubation. She was transferred to PACU for post extubation management. 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 PLAN: Patient to be left on aspirin 81 mg a day and Plavix 75 mg per day for 2 weeks. Follow-up in the clinic 2 weeks post discharge. Electronically Signed   By: Luanne Bras M.D.   On: 05/05/2020 09:52   IR US Guide Vasc Access Right  Result Date: 05/06/2020 CLINICAL DATA:  Patient with headaches and multiple intracranial aneurysms. Patient admitted for unruptured left middle cerebral artery aneurysm. EXAM: TRANSCATHETER THERAPY EMBOLIZATION COMPARISON:  Diagnostic catheter arteriogram of February 06, 2020. MEDICATIONS: Heparin 3,000 units IV. Ancef 2 g IV antibiotic was administered within 1 hour. ANESTHESIA/SEDATION: General anesthesia  CONTRAST:  Isovue 300 approximately 120 mL FLUOROSCOPY TIME:  Fluoroscopy Time: 35 minutes 18 seconds (1457 mGy). COMPLICATIONS: None immediate. TECHNIQUE: Informed written consent was obtained from the patient after a thorough discussion of the procedural risks, benefits and alternatives. All questions were addressed. Maximal Sterile Barrier Technique was utilized including caps, mask, sterile gowns, sterile gloves, sterile drape, hand hygiene and skin antiseptic. A timeout was performed prior to the initiation of the procedure. Initially a radial access was obtained with ultrasound guidance after having documented the right radial artery morphology with ultrasound. However, due to persistent vaso spasm induced by the micro guidewire, this approach was abandoned. The right groin was prepped and draped in the usual sterile fashion. Thereafter using modified Seldinger technique, transfemoral access into the right common femoral artery was obtained without difficulty. Over a 0.035 inch guidewire, an 8 [place] 25 cm Pinnacle sheath was inserted. Through this, and also over 0.035 inch guidewire, a 5 [place] JB 1 catheter was advanced to the aortic arch region and selectively positioned in the left common carotid artery. FINDINGS:.: FINDINGS:. The left common carotid arteriogram demonstrates the left external carotid artery and its major branches to be widely patent. The left internal carotid artery at the bulb to the cranial skull base is also widely patent. Patency is maintained into the petrous, cavernous and supraclinoid segments. Again demonstrated are 2 small saccular outpouchings projecting posteriorly from the supraclinoid left ICA, one in the region of the origin of the anterior choroidal artery. The left middle cerebral artery and the left anterior cerebral artery opacify into the capillary and venous phases. Prompt filling via the  anterior communicating artery of the right anterior cerebral A2 segment is  seen. Arising at the bifurcation of the left middle cerebral artery again noted is a saccular aneurysm with a relatively wide neck projecting slightly anteriorly. A 3D rotational arteriogram was then performed from the left common carotid artery extending intracranially. Reconstruction was then performed on a separate workstation. Optimal views were then obtained for treatment of the aneurysm. The aneurysm measured to be approximately 4.1 mm in its maximum width, and 3.82 mm in height. It was decided to use a WEB SL W5-4.3 for intra saccular disruption device for embolization. ENDOVASCULAR EMBOLIZATION OF LEFT MIDDLE CEREBRAL ARTERY BIFURCATION REGION ANEURYSM The diagnostic JB 1 catheter in the left common carotid artery was then exchanged over a 0.035 inch 300 cm Rosen exchange guidewire for a 90 cm 6 [place] Infinity sheath. This was advanced to the bifurcation of the left common carotid artery. The guidewire was removed. Good aspiration obtained from the hub of the Infinity sheath. A gentle control arteriogram performed through this demonstrated no evidence of vasospasm or of intraluminal filling defects. This was then connected to continuous heparinized saline infusion. Through the Infinity sheath, a combination of a 6 [place] 115 cm intermediary catheter inside of which was a Via 17 microcatheter was advanced over a 0.014 inch standard Synchro micro guidewire with a J configuration to the proximal cavernous segment. At this time, the Infinity guide sheath was advanced to the junction of the cervical petrous junction. Using biplane roadmap technique and constant fluoroscopic guidance, in a coaxial manner and with constant heparinized saline infusion, the micro guidewire was then gently advanced with a J-tip configuration to the left middle cerebral artery followed by the microcatheter. The micro guidewire was then gently advanced into the aneurysm followed by the microcatheter. The guidewire was removed.  Good aspiration obtained from the hub of the microcatheter. This was then connected to continuous heparinized saline infusion. The WEB SL W5-4.3 intra saccular flow disrupter was then prepped and purged in heparinized saline. It was cleared of any air bubbles. This was then recaptured into the housing catheter. This was then loaded onto the microcatheter after heparin flushed in the Tuohy Borst. Smooth advancement was obtained to the middle cerebral artery very slowly ensuring no forward motion or movements of the microcatheter which was just inside the neck of the aneurysm. The distal marker on the WEB device was advanced to the distal marker on the microcatheter. The entire system was then straightened. With the microcatheter stable, the WEB device was advanced until early formation of a Tulip configuration. A control arteriogram performed through the [facility] guide catheter demonstrated safe position and orientation of the device within the aneurysm. The microcatheter was then gently advanced further into the aneurysm followed by advancement of the WEB device into the aneurysm. Complete opening of the device was noted. A control arteriogram performed through the [facility] guide catheter demonstrated complete opening and apposition of the device into the aneurysm. The superior and inferior branches of the left middle cerebral artery were patent without evidence of encroachment on the bifurcation branches. With the WEB device well oriented in the AP and lateral projections, the microcatheter was gently withdrawn to just proximal to the proximal marker on the WEB device. Again the orientation of the WEB device within the aneurysm remained unchanged. A control arteriogram performed through the [facility] guide catheter demonstrated excellent apposition on the AP and lateral projections with contrast stasis within the aneurysm. Under constant biplane fluoroscopy, the  device was disconnected and deployed using the detachment  device at the proximal aspect of the delivery wire. The micro guidewire was gently advanced just distal to the microcatheter then withdrawn. A control arteriogram was then performed through the [facility] guide catheter in the left internal carotid artery which continued to demonstrate excellent flow through the left middle cerebral artery distribution with patency of the superior and inferior divisions, and stasis within the aneurysm itself. Control arteriograms were then performed at 10 and 20 minutes post deployment of the device. These continued to demonstrated no evidence of intraluminal filling defects, or of occlusions in the left middle cerebral artery and the left anterior cerebral artery distributions. The [place] 6 [place] guide catheter was removed. The Infinity sheath was then retrieved to just proximal to the left common carotid artery bifurcation. Control arteriogram performed through this continued to demonstrate wide patency of the of the left internal carotid artery proximally and distally with no change in the intra saccular device within left middle cerebral artery aneurysm. The Infinity sheath was removed. The 8 French Pinnacle sheath was then removed with the successful application of an 8 French Angio-Seal closure device for hemostasis. Distal pulses remained palpable in both feet unchanged. [name] ACT was in the region of approximately 250. This was partially reversed with 5 mg of protamine sulfate. [name] general anesthesia was slowly reversed. She was able to follow simple commands following the extubation. She was transferred to PACU for post extubation management. 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 PLAN: Patient to be left on aspirin 81 mg a day and Plavix 75 mg per day for 2 weeks. Follow-up in the clinic 2 weeks post discharge. Electronically Signed   By: Luanne Bras M.D.   On: 05/05/2020 09:52   EEG adult  Result Date: 05/05/2020 Lora Havens, MD     05/05/2020  9:26 AM Patient Name: Bethany [name] MRN: [id number] Epilepsy Attending: Lora Havens Referring Physician/Provider: Dr Antony Contras Date: 05/04/2020 Duration: 25.07 mins Patient history: 69 year old lady with known prior history of left MCA infarct with mild residual aphasia and right-sided weakness with sudden onset of altered mental status following elective left middle cerebral artery bifurcation aneurysm successful coiling with exam consistent with aphasia and mild right-sided weakness and encephalopathy. EEG to evaluate for seizure Level of alertness: Awake AEDs during EEG study: GBP, PGB Technical aspects: This EEG study was done with scalp electrodes positioned according to the 10-20 International system of electrode placement. Electrical activity was acquired at a sampling rate of 500Hz and reviewed with a high frequency filter of 70Hz and a low frequency filter of 1Hz. EEG data were recorded continuously and digitally stored. Description: The posterior dominant rhythm consists of 8 Hz activity of moderate voltage (25-35 uV) seen predominantly in posterior head regions, symmetric and reactive to eye opening and eye closing. EEG showed continuous left temporal 3 to 6 Hz theta-delta slowing as well as intermittent generalized 3 to 6 Hz theta-delta slowing. Hyperventilation and photic stimulation were not performed.   ABNORMALITY -Continuous slow, left temporal IMPRESSION: This study is suggestive of cortical dysfunction in left temporal region secondary to underlying structural abnormality/stroke. Additionally there is mild diffuse encephalopathy, nonspecific etiology. No seizures or epileptiform discharges were seen throughout the recording. Lora Havens   VAS [place] GROIN PSEUDOANEURYSM  Result Date: 05/05/2020  ARTERIAL PSEUDOANEURYSM  Exam: Right groin Indications: Patient  complains  of groin pain. History: Status post left carotid arterigram follow by emblomization left tMCA aneurysm. Comparison Study: No prior. Performing Technologist: Oda Cogan RDMS, RVT  Examination Guidelines: A complete evaluation includes B-mode imaging, spectral Doppler, color Doppler, and power Doppler as needed of all accessible portions of each vessel. Bilateral testing is considered an integral part of a complete examination. Limited examinations for reoccurring indications may be performed as noted. +------------+----------+--------+------+----------+ Right DuplexPSV (cm/s)WaveformPlaqueComment(s) +------------+----------+--------+------+----------+ CFA            116                    Patent   +------------+----------+--------+------+----------+ Prox SFA       188                    Patent   +------------+----------+--------+------+----------+ Right Vein comments:Patent right common femoral vein.  Summary: No evidence of right groin pseudoaneurysm or AVF.  Diagnosing physician: Harold Barban MD Electronically signed by Harold Barban MD on 05/05/2020 at 28:06:38 PM.   --------------------------------------------------------------------------------    Final    CT HEAD CODE STROKE WO CONTRAST  Result Date: 05/04/2020 CLINICAL DATA:  Code stroke.  Stroke follow-up. EXAM: CT HEAD WITHOUT CONTRAST TECHNIQUE: Contiguous axial images were obtained from the base of the skull through the vertex without intravenous contrast. COMPARISON:  CT head and MRI from July 6, 21. FINDINGS: Brain: Evolving encephalomalacia in the lateral left temporal lobe, in the region of the previously characterized infarct. Decreased edema and mass effect. No evidence of new/acute large vascular territory infarct. No acute hemorrhage. Vascular: No hyperdense vessel identified. Post-surgical change related to left MCA aneurysm embolization with a web flow disrupter device. Skull: No acute fracture. Sinuses/Orbits: Mild  paranasal sinus mucosal thickening without air-fluid levels. Unremarkable orbits. Other: No mastoid effusions. ASPECTS [facility] Stroke Program Early CT Score) total score (0-10 with 10 being normal): 10 IMPRESSION: 1. No evidence of acute intracranial abnormality. ASPECTS is 10. 2. Evolving encephalomalacia in the lateral left temporal lobe, in the region of the previously characterized infarct. 3. Post-surgical change related to left MCA aneurysm embolization with a web flow disrupter device. Code stroke imaging results were communicated on 05/04/2020 at 2:22 pm to provider Dr. Leonie Man via telephone, who verbally acknowledged these results. Electronically Signed   By: Margaretha Sheffield MD   On: 05/04/2020 14:28   CT ANGIO HEAD CODE STROKE  Result Date: 05/04/2020 CLINICAL DATA:  Stroke suspected. EXAM: CT ANGIOGRAPHY HEAD AND NECK TECHNIQUE: Multidetector CT imaging of the head and neck was performed using the standard protocol during bolus administration of intravenous contrast. Multiplanar CT image reconstructions and MIPs were obtained to evaluate the vascular anatomy. Carotid stenosis measurements (when applicable) are obtained utilizing NASCET criteria, using the distal internal carotid diameter as the denominator. CONTRAST:  92m OMNIPAQUE IOHEXOL 350 MG/ML SOLN COMPARISON:  MRA November 20, 2019. CT chest June 15 21. FINDINGS: CTA NECK FINDINGS Aortic arch: Great vessel origins are patent. Right carotid system: No evidence of dissection, stenosis (50% or greater) or occlusion. Atherosclerosis at the bifurcation. Left carotid system: No evidence of dissection, stenosis (50% or greater) or occlusion. Atherosclerosis at the bifurcation. Vertebral arteries: Codominant. No evidence of dissection, stenosis (50% or greater) or occlusion. Skeleton: No acute findings. Other neck: No mass or suspicious adenopathy. Upper chest: Small bilateral pleural effusions. Patchy opacities in the lung apices may represent  aspiration and/or pneumonia. Review of the MIP images confirms the above findings CTA HEAD  FINDINGS Anterior circulation: No significant proximal stenosis or large vessel occlusion. Status post treatment of a left MCA aneurysm with web flow disrupted device. There is some expected flow within the aneurysm sac. Approximately 2 mm left posterior communicating artery aneurysm (see series 7, image 109). Distal left M2/M3 MCA branch stenosis was better characterized on the prior MRA. Posterior circulation: No significant stenosis, proximal occlusion, aneurysm, or vascular malformation. Mild stenosis of the basilar artery. Venous sinuses: As permitted by contrast timing, patent. Review of the MIP images confirms the above findings IMPRESSION: 1. No large vessel occlusion or new hemodynamically significant proximal stenosis in the head or neck. 2. Status post treatment of a left MCA aneurysm with web flow disrupted device. There is some expected flow within the aneurysm sac. 3. 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 4. Small bilateral pleural effusions. Patchy opacities in the lung apices may represent aspiration and/or pneumonia. Additionally, there appears to be a new 1 cm subpleural nodular opacity in the imaged lateral left upper lobe that is new since CT chest from October 29, 2019. While this most likely represents an infectious/inflammatory process, recommend follow-chest CT when the patient is able to exclude pulmonary nodule. Critical findings discussed with Dr. Marjory Lies at 2:24 PM and Dr. Leonie Man at 2:45 PM via telephone. Lung findings discussed with Dr. Leonie Man at 3:00 PM. Electronically Signed   By: Margaretha Sheffield MD   On: 05/04/2020 15:05   CT ANGIO NECK CODE STROKE  Result Date: 05/04/2020 CLINICAL DATA:  Stroke suspected. EXAM: CT ANGIOGRAPHY HEAD AND NECK TECHNIQUE: Multidetector CT imaging of  the head and neck was performed using the standard protocol during bolus administration of intravenous contrast. Multiplanar CT image reconstructions and MIPs were obtained to evaluate the vascular anatomy. Carotid stenosis measurements (when applicable) are obtained utilizing NASCET criteria, using the distal internal carotid diameter as the denominator. CONTRAST:  42m OMNIPAQUE IOHEXOL 350 MG/ML SOLN COMPARISON:  MRA November 20, 2019. CT chest June 15 21. FINDINGS: CTA NECK FINDINGS Aortic arch: Great vessel origins are patent. Right carotid system: No evidence of dissection, stenosis (50% or greater) or occlusion. Atherosclerosis at the bifurcation. Left carotid system: No evidence of dissection, stenosis (50% or greater) or occlusion. Atherosclerosis at the bifurcation. Vertebral arteries: Codominant. No evidence of dissection, stenosis (50% or greater) or occlusion. Skeleton: No acute findings. Other neck: No mass or suspicious adenopathy. Upper chest: Small bilateral pleural effusions. Patchy opacities in the lung apices may represent aspiration and/or pneumonia. Review of the MIP images confirms the above findings CTA HEAD FINDINGS Anterior circulation: No significant proximal stenosis or large vessel occlusion. Status post treatment of a left MCA aneurysm with web flow disrupted device. There is some expected flow within the aneurysm sac. Approximately 2 mm left posterior communicating artery aneurysm (see series 7, image 109). Distal left M2/M3 MCA branch stenosis was better characterized on the prior MRA. Posterior circulation: No significant stenosis, proximal occlusion, aneurysm, or vascular malformation. Mild stenosis of the basilar artery. Venous sinuses: As permitted by contrast timing, patent. Review of the MIP images confirms the above findings IMPRESSION: 1. No large vessel occlusion or new hemodynamically significant proximal stenosis in the head or neck. 2. Status post treatment of a left MCA  aneurysm with web flow disrupted device. There is some expected flow within the aneurysm sac. 3. 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 4. Small bilateral pleural effusions. Patchy opacities in the lung apices may represent aspiration and/or pneumonia. Additionally, there appears to be a new 1 cm subpleural nodular opacity in the imaged lateral left upper lobe that is new since CT chest from October 29, 2019. While this most likely represents an infectious/inflammatory process, recommend follow-chest CT when the patient is able to exclude pulmonary nodule. Critical findings discussed with Dr. Marjory Lies at 2:24 PM and Dr. Leonie Man at 2:45 PM via telephone. Lung findings discussed with Dr. Leonie Man at 3:00 PM. Electronically Signed   By: Margaretha Sheffield MD   On: 05/04/2020 15:05    Labs:  CBC: Recent Labs    04/24/20 1225 04/27/20 0719 05/04/20 1645 05/05/20 0436  WBC 7.2 12.1* 14.0* 14.3*  HGB 6.8* 10.1* 8.8* 7.8*  HCT 25.0* 33.6* 29.7* 27.5*  PLT 277 296 229 200    COAGS: Recent Labs    11/19/19 1317 02/06/20 0828 04/27/20 0719  INR 1.0 1.0 1.1  APTT 26  --  29    BMP: Recent Labs    11/20/19 0433 11/22/19 1139 01/24/20 1212 02/06/20 0718 02/06/20 1124 04/27/20 0719 05/04/20 1645 05/05/20 0436  NA 139 136 139 137 142 138 137 140  K 4.2 4.2 4.6 6.4* 5.4* 4.9 5.0 4.4  CL 103 103 107 103 108 105 108 109  CO2 _0 --  24 21* 21*  GLUCOSE 131* 169* 232* 410* 269* 228* 242* 106*  BUN 16 12 26* 28* 31* 24* 27* 22  CALCIUM 9.9 9.8 10.8* 10.4*  --  10.1 8.7* 8.8*  CREATININE 1.01*  1.04* 1.03* 1.21* 1.56* 1.30* 1.58* 1.26* 1.12*  GFRNONAA 57*  55* 55* 46* 34*  --  35* 46* 53*  GFRAA >60  >60 >60 53* 39*  --   --   --   --     LIVER FUNCTION TESTS: Recent Labs    07/19/19 1340 11/19/19 1317 01/24/20 1212 05/04/20 1645  BILITOT 0.5 0.7 0.3 0.6  AST 77* _1 ALT 119* 23 56* 65*  ALKPHOS 330* 159* 237* 192*  PROT 7.4 6.9 7.2 5.3*  ALBUMIN 3.9 3.9 3.9 3.0*    Assessment and Plan:  Left MCA aneurysm s/p endovascular embolization using WEB device via right femoral approach 05/04/2020 by Dr. Estanislado Pandy. Patient neurologically intact, however per PT/OT patient continues to require physical assistance for moving and is high fall risk (per notes brother was in room during PT assessment and felt patient much more unsteady than baseline). PT recommending CIR at this time- order placed for CIR consult. Right neck pain, chronic and stable- continue home pain medicine regimen. Right femoral puncture area tender but stable, [place] negative for pseudoaneurysm.  Continue taking Plavix 75 mg once daily and Aspirin 81 mg once daily. NIR to follow.   Electronically Signed: Earley Abide, PA-C 05/06/2020, 2:09 PM   I spent a total of 25 Minutes at the the patient's bedside AND on the patient's hospital floor or unit, greater than 50% of which was counseling/coordinating care for left MCA aneurysm s/p embolization.

## 2020-05-07 ENCOUNTER — Encounter (HOSPITAL_COMMUNITY): Payer: Self-pay

## 2020-05-07 DIAGNOSIS — I10 Essential (primary) hypertension: Secondary | ICD-10-CM

## 2020-05-07 DIAGNOSIS — R5381 Other malaise: Secondary | ICD-10-CM

## 2020-05-07 DIAGNOSIS — G894 Chronic pain syndrome: Secondary | ICD-10-CM

## 2020-05-07 LAB — GLUCOSE, CAPILLARY
Glucose-Capillary: 170 mg/dL — ABNORMAL HIGH (ref 70–99)
Glucose-Capillary: 176 mg/dL — ABNORMAL HIGH (ref 70–99)
Glucose-Capillary: 181 mg/dL — ABNORMAL HIGH (ref 70–99)
Glucose-Capillary: 201 mg/dL — ABNORMAL HIGH (ref 70–99)
Glucose-Capillary: 207 mg/dL — ABNORMAL HIGH (ref 70–99)
Glucose-Capillary: 250 mg/dL — ABNORMAL HIGH (ref 70–99)
Glucose-Capillary: 262 mg/dL — ABNORMAL HIGH (ref 70–99)

## 2020-05-07 NOTE — Progress Notes (Signed)
Referring Physician(s): Antony Contras (neurology)  Supervising Physician: Luanne Bras  Patient Status:  [name] - In-pt  Chief Complaint: "Neck pain"  Subjective:  Left MCA aneurysms/p endovascular embolizationusing WEB device via right femoral approach 12/20/2021by Dr. Estanislado Pandy. Patient awake and alert sitting in chair eating breakfast. Complains of intermittent right neck pain, chronic and stable. Discussed result of CIR PA/MD discussions yesterday- patient upset she might have to stay longer but again aware it is for her safety. Requesting to see CIR PA/MD again today. Right femoral puncture site c/d/i.   Allergies: Contrast media [iodinated diagnostic agents]  Medications: Prior to Admission medications   Medication Sig Start Date End Date Taking? Authorizing Provider  allopurinol (ZYLOPRIM) 100 MG tablet Take 100 mg by mouth at bedtime.   Yes [provider]  amLODipine (NORVASC) 10 MG tablet Take 10 mg by mouth daily.   Yes [provider]  APPLE CIDER VINEGAR PO Take 1 tablet by mouth daily.   Yes [provider]  aspirin EC 81 MG EC tablet Take 1 tablet (81 mg total) by mouth daily. Swallow whole. 11/22/19  Yes Danford, Suann Larry, MD  atorvastatin (LIPITOR) 80 MG tablet Take 1 tablet (80 mg total) by mouth daily. 11/22/19  Yes Danford, Suann Larry, MD  baclofen (LIORESAL) 10 MG tablet Take 10 mg by mouth 3 (three) times daily as needed for muscle spasms.   Yes [provider]  buprenorphine (BUTRANS) 20 MCG/HR PTWK Place 1 patch onto the skin every Monday.  11/15/19  Yes [provider]  clopidogrel (PLAVIX) 75 MG tablet Take 75 mg by mouth daily.   Yes [provider]  Dulaglutide (TRULICITY) 1.5 VZ/8.5YI SOPN Inject 1.5 mg into the skin every Monday.    Yes [provider]  ezetimibe (ZETIA) 10 MG tablet Take 10 mg by mouth daily.   Yes [provider]  gabapentin (NEURONTIN) 600 MG tablet  Take 600 mg by mouth 4 (four) times daily.    Yes [provider]  insulin glargine, 1 Unit Dial, (TOUJEO SOLOSTAR) 300 UNIT/ML Solostar Pen Inject 20 Units into the skin at bedtime.    Yes [provider]  Ipratropium-Albuterol (COMBIVENT) 20-100 MCG/ACT AERS respimat Inhale 1 puff into the lungs every 6 (six) hours as needed for wheezing or shortness of breath. 03/17/17  Yes Hongalgi, Lenis Dickinson, MD  levothyroxine (SYNTHROID) 75 MCG tablet Take 75 mcg by mouth daily. 02/28/19  Yes [provider]  losartan (COZAAR) 25 MG tablet Take 1 tablet (25 mg total) by mouth daily. 11/22/19 11/21/20 Yes Danford, Suann Larry, MD  metoprolol (LOPRESSOR) 50 MG tablet Take 50 mg by mouth 2 (two) times daily.  03/06/14  Yes [provider]  Multiple Vitamin (MULTIVITAMIN WITH MINERALS) TABS tablet Take 1 tablet by mouth daily.   Yes [provider]  oxyCODONE-acetaminophen (PERCOCET) 10-325 MG tablet Take 1 tablet by mouth every 6 (six) hours as needed (breakthrough pain).    Yes [provider]  pregabalin (LYRICA) 25 MG capsule Take 25 mg by mouth 2 (two) times daily.  01/24/19  Yes [provider]  sennosides-docusate sodium (SENOKOT-S) 8.6-50 MG tablet Take 1 tablet by mouth at bedtime.   Yes [provider]  Vitamin D, Ergocalciferol, (DRISDOL) 1.25 MG (50000 UNIT) CAPS capsule Take 50,000 Units by mouth every Monday.    Yes [provider]  XTAMPZA ER 27 MG C12A Take 27 mg by mouth 2 (two) times daily.  02/27/19  Yes  [provider]  feeding supplement, ENSURE ENLIVE, (ENSURE ENLIVE) LIQD Take 237 mLs by mouth 2 (two) times daily between meals. 03/30/19   Bonnell Public, MD  furosemide (LASIX) 40 MG tablet Take 1 tablet (40 mg total) by mouth daily as needed. Patient taking differently: Take 40 mg by mouth daily as needed for fluid. 05/20/19 05/19/20  O'NealCassie Freer, MD  lidocaine-prilocaine (EMLA) cream Apply 1  application topically as needed. Patient taking differently: Apply 1 application topically daily as needed (port access). 07/03/19   Truitt Merle, MD  [name] 4 MG/0.1ML LIQD nasal spray kit Place 1 spray into the nose as needed for opioid reversal. 08/22/19   [provider]  pantoprazole (PROTONIX) 40 MG tablet Take 40 mg by mouth every morning.    [provider]     Vital Signs: BP (!) 164/93   Pulse 61   Temp 98.7 F (37.1 C) (Oral)   Resp 16   Ht _0  (1.651 m)   Wt 180 lb 5.4 oz (81.8 kg)   SpO2 100%   BMI 30.01 kg/m   Physical Exam Vitals and nursing note reviewed.  Constitutional:      General: She is not in acute distress.    Appearance: Normal appearance.  Cardiovascular:     Rate and Rhythm: Normal rate and regular rhythm.     Heart sounds: Normal heart sounds. No murmur heard.   Pulmonary:     Effort: Pulmonary effort is normal. No respiratory distress.     Breath sounds: Normal breath sounds. No wheezing.  Skin:    General: Skin is warm and dry.     Comments: Right femoral puncture site soft with mild tenderness, no active bleeding or hematoma noted.   Neurological:     Mental Status: She is alert and oriented to person, place, and time.     Comments: Moving all extremities. Speech clear. Fine motor and coordination intact.     Imaging: MR BRAIN WO CONTRAST  Result Date: 05/05/2020 CLINICAL DATA:  Initial evaluation for speech abnormality following recent aneurysm repair. EXAM: MRI HEAD WITHOUT CONTRAST TECHNIQUE: Multiplanar, multiecho pulse sequences of the brain and surrounding structures were obtained without intravenous contrast. COMPARISON:  Previous CT from 05/04/2020 and MRI from 11/19/2019. FINDINGS: Brain: Cerebral volume within normal limits for age. Mild scattered chronic microvascular ischemic changes noted involving the supratentorial cerebral white matter and pons. Involving encephalomalacia of with laminar necrosis seen at the  posterior left temporal region related to the previously identified infarct. Associated susceptibility artifact related to laminar necrosis and/or chronic hemosiderin staining. No other abnormal foci of restricted diffusion to suggest acute or subacute ischemia. Gray-white matter differentiation otherwise maintained. No other evidence for acute or chronic intracranial hemorrhage. No mass lesion, midline shift or mass effect. No hydrocephalus or extra-axial fluid collection. Pituitary gland suprasellar region normal. Midline structures intact. Vascular: Major intracranial vascular flow voids are maintained. Skull and upper cervical spine: Craniocervical junction within normal limits. Diffusely decreased T1 signal intensity seen throughout the visualized bone marrow, nonspecific, but most commonly related to anemia, smoking, or obesity. No scalp soft tissue abnormality. Sinuses/Orbits: Patient status post bilateral ocular lens replacement. Globes and orbital soft tissues demonstrate no acute finding. Paranasal sinuses are largely clear. Right mastoid effusion noted. Visualized nasopharynx within normal limits. Other: None. IMPRESSION: 1. No acute intracranial infarct or other abnormality. 2. Evolving encephalomalacia with laminar necrosis involving the posterior left temporal region related to previously identified left MCA territory infarct.  3. Underlying mild chronic microvascular ischemic disease. 4. Right mastoid effusion. Electronically Signed   By: Jeannine Boga M.D.   On: 05/05/2020 01:36   IR Transcath/Emboliz  Result Date: 05/06/2020 CLINICAL DATA:  Patient with headaches and multiple intracranial aneurysms. Patient admitted for unruptured left middle cerebral artery aneurysm. EXAM: TRANSCATHETER THERAPY EMBOLIZATION COMPARISON:  Diagnostic catheter arteriogram of February 06, 2020. MEDICATIONS: Heparin 3,000 units IV. Ancef 2 g IV antibiotic was administered within 1 hour. ANESTHESIA/SEDATION:  General anesthesia CONTRAST:  Isovue 300 approximately 120 mL FLUOROSCOPY TIME:  Fluoroscopy Time: 35 minutes 18 seconds (1457 mGy). COMPLICATIONS: None immediate. TECHNIQUE: Informed written consent was obtained from the patient after a thorough discussion of the procedural risks, benefits and alternatives. All questions were addressed. Maximal Sterile Barrier Technique was utilized including caps, mask, sterile gowns, sterile gloves, sterile drape, hand hygiene and skin antiseptic. A timeout was performed prior to the initiation of the procedure. Initially a radial access was obtained with ultrasound guidance after having documented the right radial artery morphology with ultrasound. However, due to persistent vaso spasm induced by the micro guidewire, this approach was abandoned. The right groin was prepped and draped in the usual sterile fashion. Thereafter using modified Seldinger technique, transfemoral access into the right common femoral artery was obtained without difficulty. Over a 0.035 inch guidewire, an 8 [place] 25 cm Pinnacle sheath was inserted. Through this, and also over 0.035 inch guidewire, a 5 [place] JB 1 catheter was advanced to the aortic arch region and selectively positioned in the left common carotid artery. FINDINGS:.: FINDINGS:. The left common carotid arteriogram demonstrates the left external carotid artery and its major branches to be widely patent. The left internal carotid artery at the bulb to the cranial skull base is also widely patent. Patency is maintained into the petrous, cavernous and supraclinoid segments. Again demonstrated are 2 small saccular outpouchings projecting posteriorly from the supraclinoid left ICA, one in the region of the origin of the anterior choroidal artery. The left middle cerebral artery and the left anterior cerebral artery opacify into the capillary and venous phases. Prompt filling via the anterior communicating artery of the right anterior  cerebral A2 segment is seen. Arising at the bifurcation of the left middle cerebral artery again noted is a saccular aneurysm with a relatively wide neck projecting slightly anteriorly. A 3D rotational arteriogram was then performed from the left common carotid artery extending intracranially. Reconstruction was then performed on a separate workstation. Optimal views were then obtained for treatment of the aneurysm. The aneurysm measured to be approximately 4.1 mm in its maximum width, and 3.82 mm in height. It was decided to use a WEB SL W5-4.3 for intra saccular disruption device for embolization. ENDOVASCULAR EMBOLIZATION OF LEFT MIDDLE CEREBRAL ARTERY BIFURCATION REGION ANEURYSM The diagnostic JB 1 catheter in the left common carotid artery was then exchanged over a 0.035 inch 300 cm Rosen exchange guidewire for a 90 cm 6 [place] Infinity sheath. This was advanced to the bifurcation of the left common carotid artery. The guidewire was removed. Good aspiration obtained from the hub of the Infinity sheath. A gentle control arteriogram performed through this demonstrated no evidence of vasospasm or of intraluminal filling defects. This was then connected to continuous heparinized saline infusion. Through the Infinity sheath, a combination of a 6 [place] 115 cm intermediary catheter inside of which was a Via 17 microcatheter was advanced over a 0.014 inch standard Synchro micro guidewire with a J configuration to the proximal  cavernous segment. At this time, the Infinity guide sheath was advanced to the junction of the cervical petrous junction. Using biplane roadmap technique and constant fluoroscopic guidance, in a coaxial manner and with constant heparinized saline infusion, the micro guidewire was then gently advanced with a J-tip configuration to the left middle cerebral artery followed by the microcatheter. The micro guidewire was then gently advanced into the aneurysm followed by the microcatheter. The  guidewire was removed. Good aspiration obtained from the hub of the microcatheter. This was then connected to continuous heparinized saline infusion. The WEB SL W5-4.3 intra saccular flow disrupter was then prepped and purged in heparinized saline. It was cleared of any air bubbles. This was then recaptured into the housing catheter. This was then loaded onto the microcatheter after heparin flushed in the Tuohy Borst. Smooth advancement was obtained to the middle cerebral artery very slowly ensuring no forward motion or movements of the microcatheter which was just inside the neck of the aneurysm. The distal marker on the WEB device was advanced to the distal marker on the microcatheter. The entire system was then straightened. With the microcatheter stable, the WEB device was advanced until early formation of a Tulip configuration. A control arteriogram performed through the [facility] guide catheter demonstrated safe position and orientation of the device within the aneurysm. The microcatheter was then gently advanced further into the aneurysm followed by advancement of the WEB device into the aneurysm. Complete opening of the device was noted. A control arteriogram performed through the [facility] guide catheter demonstrated complete opening and apposition of the device into the aneurysm. The superior and inferior branches of the left middle cerebral artery were patent without evidence of encroachment on the bifurcation branches. With the WEB device well oriented in the AP and lateral projections, the microcatheter was gently withdrawn to just proximal to the proximal marker on the WEB device. Again the orientation of the WEB device within the aneurysm remained unchanged. A control arteriogram performed through the [name] guide catheter demonstrated excellent apposition on the AP and lateral projections with contrast stasis within the aneurysm. Under constant biplane fluoroscopy, the device was disconnected and deployed  using the detachment device at the proximal aspect of the delivery wire. The micro guidewire was gently advanced just distal to the microcatheter then withdrawn. A control arteriogram was then performed through the [facility] guide catheter in the left internal carotid artery which continued to demonstrate excellent flow through the left middle cerebral artery distribution with patency of the superior and inferior divisions, and stasis within the aneurysm itself. Control arteriograms were then performed at 10 and 20 minutes post deployment of the device. These continued to demonstrated no evidence of intraluminal filling defects, or of occlusions in the left middle cerebral artery and the left anterior cerebral artery distributions. The [place] 6 [place] guide catheter was removed. The Infinity sheath was then retrieved to just proximal to the left common carotid artery bifurcation. Control arteriogram performed through this continued to demonstrate wide patency of the of the left internal carotid artery proximally and distally with no change in the intra saccular device within left middle cerebral artery aneurysm. The Infinity sheath was removed. The 8 French Pinnacle sheath was then removed with the successful application of an 8 [place] Angio-Seal closure device for hemostasis. Distal pulses remained palpable in both feet unchanged. [name] ACT was in the region of approximately 250. This was partially reversed with 5 mg of protamine sulfate. [name] general anesthesia was slowly reversed.  She was able to follow simple commands following the extubation. She was transferred to PACU for post extubation management. 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 PLAN: Patient to be left on aspirin 81 mg a day and Plavix 75 mg per day for 2 weeks. Follow-up in the clinic 2 weeks post discharge. Electronically  Signed   By: Luanne Bras M.D.   On: 05/05/2020 09:52   IR Angiogram Follow Up Study  Result Date: 05/06/2020 CLINICAL DATA:  Patient with headaches and multiple intracranial aneurysms. Patient admitted for unruptured left middle cerebral artery aneurysm. EXAM: TRANSCATHETER THERAPY EMBOLIZATION COMPARISON:  Diagnostic catheter arteriogram of February 06, 2020. MEDICATIONS: Heparin 3,000 units IV. Ancef 2 g IV antibiotic was administered within 1 hour. ANESTHESIA/SEDATION: General anesthesia CONTRAST:  Isovue 300 approximately 120 mL FLUOROSCOPY TIME:  Fluoroscopy Time: 35 minutes 18 seconds (1457 mGy). COMPLICATIONS: None immediate. TECHNIQUE: Informed written consent was obtained from the patient after a thorough discussion of the procedural risks, benefits and alternatives. All questions were addressed. Maximal Sterile Barrier Technique was utilized including caps, mask, sterile gowns, sterile gloves, sterile drape, hand hygiene and skin antiseptic. A timeout was performed prior to the initiation of the procedure. Initially a radial access was obtained with ultrasound guidance after having documented the right radial artery morphology with ultrasound. However, due to persistent vaso spasm induced by the micro guidewire, this approach was abandoned. The right groin was prepped and draped in the usual sterile fashion. Thereafter using modified Seldinger technique, transfemoral access into the right common femoral artery was obtained without difficulty. Over a 0.035 inch guidewire, an 8 [place] 25 cm Pinnacle sheath was inserted. Through this, and also over 0.035 inch guidewire, a 5 [place] JB 1 catheter was advanced to the aortic arch region and selectively positioned in the left common carotid artery. FINDINGS:.: FINDINGS:. The left common carotid arteriogram demonstrates the left external carotid artery and its major branches to be widely patent. The left internal carotid artery at the bulb to  the cranial skull base is also widely patent. Patency is maintained into the petrous, cavernous and supraclinoid segments. Again demonstrated are 2 small saccular outpouchings projecting posteriorly from the supraclinoid left ICA, one in the region of the origin of the anterior choroidal artery. The left middle cerebral artery and the left anterior cerebral artery opacify into the capillary and venous phases. Prompt filling via the anterior communicating artery of the right anterior cerebral A2 segment is seen. Arising at the bifurcation of the left middle cerebral artery again noted is a saccular aneurysm with a relatively wide neck projecting slightly anteriorly. A 3D rotational arteriogram was then performed from the left common carotid artery extending intracranially. Reconstruction was then performed on a separate workstation. Optimal views were then obtained for treatment of the aneurysm. The aneurysm measured to be approximately 4.1 mm in its maximum width, and 3.82 mm in height. It was decided to use a WEB SL W5-4.3 for intra saccular disruption device for embolization. ENDOVASCULAR EMBOLIZATION OF LEFT MIDDLE CEREBRAL ARTERY BIFURCATION REGION ANEURYSM The diagnostic JB 1 catheter in the left common carotid artery was then exchanged over a 0.035 inch 300 cm Rosen exchange guidewire for a 90 cm 6 [place] Infinity sheath. This was advanced to the bifurcation of the left common carotid artery. The guidewire was removed. Good aspiration obtained from the hub of the Infinity sheath. A gentle control arteriogram performed through  this demonstrated no evidence of vasospasm or of intraluminal filling defects. This was then connected to continuous heparinized saline infusion. Through the Infinity sheath, a combination of a 6 [place] 115 cm intermediary catheter inside of which was a Via 17 microcatheter was advanced over a 0.014 inch standard Synchro micro guidewire with a J configuration to the proximal  cavernous segment. At this time, the Infinity guide sheath was advanced to the junction of the cervical petrous junction. Using biplane roadmap technique and constant fluoroscopic guidance, in a coaxial manner and with constant heparinized saline infusion, the micro guidewire was then gently advanced with a J-tip configuration to the left middle cerebral artery followed by the microcatheter. The micro guidewire was then gently advanced into the aneurysm followed by the microcatheter. The guidewire was removed. Good aspiration obtained from the hub of the microcatheter. This was then connected to continuous heparinized saline infusion. The WEB SL W5-4.3 intra saccular flow disrupter was then prepped and purged in heparinized saline. It was cleared of any air bubbles. This was then recaptured into the housing catheter. This was then loaded onto the microcatheter after heparin flushed in the Tuohy Borst. Smooth advancement was obtained to the middle cerebral artery very slowly ensuring no forward motion or movements of the microcatheter which was just inside the neck of the aneurysm. The distal marker on the WEB device was advanced to the distal marker on the microcatheter. The entire system was then straightened. With the microcatheter stable, the WEB device was advanced until early formation of a Tulip configuration. A control arteriogram performed through the [facility] guide catheter demonstrated safe position and orientation of the device within the aneurysm. The microcatheter was then gently advanced further into the aneurysm followed by advancement of the WEB device into the aneurysm. Complete opening of the device was noted. A control arteriogram performed through the [name] guide catheter demonstrated complete opening and apposition of the device into the aneurysm. The superior and inferior branches of the left middle cerebral artery were patent without evidence of encroachment on the bifurcation branches. With the  WEB device well oriented in the AP and lateral projections, the microcatheter was gently withdrawn to just proximal to the proximal marker on the WEB device. Again the orientation of the WEB device within the aneurysm remained unchanged. A control arteriogram performed through the [facility] guide catheter demonstrated excellent apposition on the AP and lateral projections with contrast stasis within the aneurysm. Under constant biplane fluoroscopy, the device was disconnected and deployed using the detachment device at the proximal aspect of the delivery wire. The micro guidewire was gently advanced just distal to the microcatheter then withdrawn. A control arteriogram was then performed through the [facility] guide catheter in the left internal carotid artery which continued to demonstrate excellent flow through the left middle cerebral artery distribution with patency of the superior and inferior divisions, and stasis within the aneurysm itself. Control arteriograms were then performed at 10 and 20 minutes post deployment of the device. These continued to demonstrated no evidence of intraluminal filling defects, or of occlusions in the left middle cerebral artery and the left anterior cerebral artery distributions. The [place] 6 [place] guide catheter was removed. The Infinity sheath was then retrieved to just proximal to the left common carotid artery bifurcation. Control arteriogram performed through this continued to demonstrate wide patency of the of the left internal carotid artery proximally and distally with no change in the intra saccular device within left middle cerebral artery aneurysm. The  Infinity sheath was removed. The 8 French Pinnacle sheath was then removed with the successful application of an 8 [place] Angio-Seal closure device for hemostasis. Distal pulses remained palpable in both feet unchanged. [name] ACT was in the region of approximately 250. This was partially reversed with 5 mg of protamine  sulfate. [name] general anesthesia was slowly reversed. She was able to follow simple commands following the extubation. She was transferred to PACU for post extubation management. 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 PLAN: Patient to be left on aspirin 81 mg a day and Plavix 75 mg per day for 2 weeks. Follow-up in the clinic 2 weeks post discharge. Electronically Signed   By: Luanne Bras M.D.   On: 05/05/2020 09:52   IR 3D Independent Darreld Mclean  Result Date: 05/06/2020 CLINICAL DATA:  Patient with headaches and multiple intracranial aneurysms. Patient admitted for unruptured left middle cerebral artery aneurysm. EXAM: TRANSCATHETER THERAPY EMBOLIZATION COMPARISON:  Diagnostic catheter arteriogram of February 06, 2020. MEDICATIONS: Heparin 3,000 units IV. Ancef 2 g IV antibiotic was administered within 1 hour. ANESTHESIA/SEDATION: General anesthesia CONTRAST:  Isovue 300 approximately 120 mL FLUOROSCOPY TIME:  Fluoroscopy Time: 35 minutes 18 seconds (1457 mGy). COMPLICATIONS: None immediate. TECHNIQUE: Informed written consent was obtained from the patient after a thorough discussion of the procedural risks, benefits and alternatives. All questions were addressed. Maximal Sterile Barrier Technique was utilized including caps, mask, sterile gowns, sterile gloves, sterile drape, hand hygiene and skin antiseptic. A timeout was performed prior to the initiation of the procedure. Initially a radial access was obtained with ultrasound guidance after having documented the right radial artery morphology with ultrasound. However, due to persistent vaso spasm induced by the micro guidewire, this approach was abandoned. The right groin was prepped and draped in the usual sterile fashion. Thereafter using modified Seldinger technique, transfemoral access into the right common femoral artery was  obtained without difficulty. Over a 0.035 inch guidewire, an 8 [place] 25 cm Pinnacle sheath was inserted. Through this, and also over 0.035 inch guidewire, a 5 [place] JB 1 catheter was advanced to the aortic arch region and selectively positioned in the left common carotid artery. FINDINGS:.: FINDINGS:. The left common carotid arteriogram demonstrates the left external carotid artery and its major branches to be widely patent. The left internal carotid artery at the bulb to the cranial skull base is also widely patent. Patency is maintained into the petrous, cavernous and supraclinoid segments. Again demonstrated are 2 small saccular outpouchings projecting posteriorly from the supraclinoid left ICA, one in the region of the origin of the anterior choroidal artery. The left middle cerebral artery and the left anterior cerebral artery opacify into the capillary and venous phases. Prompt filling via the anterior communicating artery of the right anterior cerebral A2 segment is seen. Arising at the bifurcation of the left middle cerebral artery again noted is a saccular aneurysm with a relatively wide neck projecting slightly anteriorly. A 3D rotational arteriogram was then performed from the left common carotid artery extending intracranially. Reconstruction was then performed on a separate workstation. Optimal views were then obtained for treatment of the aneurysm. The aneurysm measured to be approximately 4.1 mm in its maximum width, and 3.82 mm in height. It was decided to use a WEB SL W5-4.3 for intra saccular disruption device for embolization. ENDOVASCULAR EMBOLIZATION OF LEFT MIDDLE CEREBRAL ARTERY BIFURCATION REGION ANEURYSM The diagnostic JB 1  catheter in the left common carotid artery was then exchanged over a 0.035 inch 300 cm Rosen exchange guidewire for a 90 cm 6 [place] Infinity sheath. This was advanced to the bifurcation of the left common carotid artery. The guidewire was removed. Good  aspiration obtained from the hub of the Infinity sheath. A gentle control arteriogram performed through this demonstrated no evidence of vasospasm or of intraluminal filling defects. This was then connected to continuous heparinized saline infusion. Through the Infinity sheath, a combination of a 6 [place] 115 cm intermediary catheter inside of which was a Via 17 microcatheter was advanced over a 0.014 inch standard Synchro micro guidewire with a J configuration to the proximal cavernous segment. At this time, the Infinity guide sheath was advanced to the junction of the cervical petrous junction. Using biplane roadmap technique and constant fluoroscopic guidance, in a coaxial manner and with constant heparinized saline infusion, the micro guidewire was then gently advanced with a J-tip configuration to the left middle cerebral artery followed by the microcatheter. The micro guidewire was then gently advanced into the aneurysm followed by the microcatheter. The guidewire was removed. Good aspiration obtained from the hub of the microcatheter. This was then connected to continuous heparinized saline infusion. The WEB SL W5-4.3 intra saccular flow disrupter was then prepped and purged in heparinized saline. It was cleared of any air bubbles. This was then recaptured into the housing catheter. This was then loaded onto the microcatheter after heparin flushed in the Tuohy Borst. Smooth advancement was obtained to the middle cerebral artery very slowly ensuring no forward motion or movements of the microcatheter which was just inside the neck of the aneurysm. The distal marker on the WEB device was advanced to the distal marker on the microcatheter. The entire system was then straightened. With the microcatheter stable, the WEB device was advanced until early formation of a Tulip configuration. A control arteriogram performed through the [name] guide catheter demonstrated safe position and orientation of the device  within the aneurysm. The microcatheter was then gently advanced further into the aneurysm followed by advancement of the WEB device into the aneurysm. Complete opening of the device was noted. A control arteriogram performed through the [facility] guide catheter demonstrated complete opening and apposition of the device into the aneurysm. The superior and inferior branches of the left middle cerebral artery were patent without evidence of encroachment on the bifurcation branches. With the WEB device well oriented in the AP and lateral projections, the microcatheter was gently withdrawn to just proximal to the proximal marker on the WEB device. Again the orientation of the WEB device within the aneurysm remained unchanged. A control arteriogram performed through the [facility] guide catheter demonstrated excellent apposition on the AP and lateral projections with contrast stasis within the aneurysm. Under constant biplane fluoroscopy, the device was disconnected and deployed using the detachment device at the proximal aspect of the delivery wire. The micro guidewire was gently advanced just distal to the microcatheter then withdrawn. A control arteriogram was then performed through the [name] guide catheter in the left internal carotid artery which continued to demonstrate excellent flow through the left middle cerebral artery distribution with patency of the superior and inferior divisions, and stasis within the aneurysm itself. Control arteriograms were then performed at 10 and 20 minutes post deployment of the device. These continued to demonstrated no evidence of intraluminal filling defects, or of occlusions in the left middle cerebral artery and the left anterior cerebral artery distributions. The  [place]of[place] 6 [place] guide catheter was removed. The Infinity sheath was then retrieved to just proximal to the left common carotid artery bifurcation. Control arteriogram performed through this continued to demonstrate wide patency  of the of the left internal carotid artery proximally and distally with no change in the intra saccular device within left middle cerebral artery aneurysm. The Infinity sheath was removed. The 8 French Pinnacle sheath was then removed with the successful application of an 8 French Angio-Seal closure device for hemostasis. Distal pulses remained palpable in both feet unchanged. [name] ACT was in the region of approximately 250. This was partially reversed with 5 mg of protamine sulfate. [name] general anesthesia was slowly reversed. She was able to follow simple commands following the extubation. She was transferred to PACU for post extubation management. 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 PLAN: Patient to be left on aspirin 81 mg a day and Plavix 75 mg per day for 2 weeks. Follow-up in the clinic 2 weeks post discharge. Electronically Signed   By: Luanne Bras M.D.   On: 05/05/2020 09:52   IR US Guide Vasc Access Right  Result Date: 05/06/2020 CLINICAL DATA:  Patient with headaches and multiple intracranial aneurysms. Patient admitted for unruptured left middle cerebral artery aneurysm. EXAM: TRANSCATHETER THERAPY EMBOLIZATION COMPARISON:  Diagnostic catheter arteriogram of February 06, 2020. MEDICATIONS: Heparin 3,000 units IV. Ancef 2 g IV antibiotic was administered within 1 hour. ANESTHESIA/SEDATION: General anesthesia CONTRAST:  Isovue 300 approximately 120 mL FLUOROSCOPY TIME:  Fluoroscopy Time: 35 minutes 18 seconds (1457 mGy). COMPLICATIONS: None immediate. TECHNIQUE: Informed written consent was obtained from the patient after a thorough discussion of the procedural risks, benefits and alternatives. All questions were addressed. Maximal Sterile Barrier Technique was utilized including caps, mask, sterile gowns, sterile gloves, sterile drape, hand hygiene and skin  antiseptic. A timeout was performed prior to the initiation of the procedure. Initially a radial access was obtained with ultrasound guidance after having documented the right radial artery morphology with ultrasound. However, due to persistent vaso spasm induced by the micro guidewire, this approach was abandoned. The right groin was prepped and draped in the usual sterile fashion. Thereafter using modified Seldinger technique, transfemoral access into the right common femoral artery was obtained without difficulty. Over a 0.035 inch guidewire, an 8 [place] 25 cm Pinnacle sheath was inserted. Through this, and also over 0.035 inch guidewire, a 5 [place] JB 1 catheter was advanced to the aortic arch region and selectively positioned in the left common carotid artery. FINDINGS:.: FINDINGS:. The left common carotid arteriogram demonstrates the left external carotid artery and its major branches to be widely patent. The left internal carotid artery at the bulb to the cranial skull base is also widely patent. Patency is maintained into the petrous, cavernous and supraclinoid segments. Again demonstrated are 2 small saccular outpouchings projecting posteriorly from the supraclinoid left ICA, one in the region of the origin of the anterior choroidal artery. The left middle cerebral artery and the left anterior cerebral artery opacify into the capillary and venous phases. Prompt filling via the anterior communicating artery of the right anterior cerebral A2 segment is seen. Arising at the bifurcation of the left middle cerebral artery again noted is a saccular aneurysm with a relatively wide neck projecting slightly anteriorly. A 3D rotational arteriogram was then performed from the left common carotid artery extending intracranially. Reconstruction was then performed  on a separate workstation. Optimal views were then obtained for treatment of the aneurysm. The aneurysm measured to be approximately 4.1 mm in its  maximum width, and 3.82 mm in height. It was decided to use a WEB SL W5-4.3 for intra saccular disruption device for embolization. ENDOVASCULAR EMBOLIZATION OF LEFT MIDDLE CEREBRAL ARTERY BIFURCATION REGION ANEURYSM The diagnostic JB 1 catheter in the left common carotid artery was then exchanged over a 0.035 inch 300 cm Rosen exchange guidewire for a 90 cm 6 [place] Infinity sheath. This was advanced to the bifurcation of the left common carotid artery. The guidewire was removed. Good aspiration obtained from the hub of the Infinity sheath. A gentle control arteriogram performed through this demonstrated no evidence of vasospasm or of intraluminal filling defects. This was then connected to continuous heparinized saline infusion. Through the Infinity sheath, a combination of a 6 [place] 115 cm intermediary catheter inside of which was a Via 17 microcatheter was advanced over a 0.014 inch standard Synchro micro guidewire with a J configuration to the proximal cavernous segment. At this time, the Infinity guide sheath was advanced to the junction of the cervical petrous junction. Using biplane roadmap technique and constant fluoroscopic guidance, in a coaxial manner and with constant heparinized saline infusion, the micro guidewire was then gently advanced with a J-tip configuration to the left middle cerebral artery followed by the microcatheter. The micro guidewire was then gently advanced into the aneurysm followed by the microcatheter. The guidewire was removed. Good aspiration obtained from the hub of the microcatheter. This was then connected to continuous heparinized saline infusion. The WEB SL W5-4.3 intra saccular flow disrupter was then prepped and purged in heparinized saline. It was cleared of any air bubbles. This was then recaptured into the housing catheter. This was then loaded onto the microcatheter after heparin flushed in the Tuohy Borst. Smooth advancement was obtained to the middle cerebral  artery very slowly ensuring no forward motion or movements of the microcatheter which was just inside the neck of the aneurysm. The distal marker on the WEB device was advanced to the distal marker on the microcatheter. The entire system was then straightened. With the microcatheter stable, the WEB device was advanced until early formation of a Tulip configuration. A control arteriogram performed through the [facility] guide catheter demonstrated safe position and orientation of the device within the aneurysm. The microcatheter was then gently advanced further into the aneurysm followed by advancement of the WEB device into the aneurysm. Complete opening of the device was noted. A control arteriogram performed through the [name] guide catheter demonstrated complete opening and apposition of the device into the aneurysm. The superior and inferior branches of the left middle cerebral artery were patent without evidence of encroachment on the bifurcation branches. With the WEB device well oriented in the AP and lateral projections, the microcatheter was gently withdrawn to just proximal to the proximal marker on the WEB device. Again the orientation of the WEB device within the aneurysm remained unchanged. A control arteriogram performed through the [facility] guide catheter demonstrated excellent apposition on the AP and lateral projections with contrast stasis within the aneurysm. Under constant biplane fluoroscopy, the device was disconnected and deployed using the detachment device at the proximal aspect of the delivery wire. The micro guidewire was gently advanced just distal to the microcatheter then withdrawn. A control arteriogram was then performed through the [facility] guide catheter in the left internal carotid artery which continued to demonstrate excellent flow through the  left middle cerebral artery distribution with patency of the superior and inferior divisions, and stasis within the aneurysm itself. Control  arteriograms were then performed at 10 and 20 minutes post deployment of the device. These continued to demonstrated no evidence of intraluminal filling defects, or of occlusions in the left middle cerebral artery and the left anterior cerebral artery distributions. The [place] 6 [place] guide catheter was removed. The Infinity sheath was then retrieved to just proximal to the left common carotid artery bifurcation. Control arteriogram performed through this continued to demonstrate wide patency of the of the left internal carotid artery proximally and distally with no change in the intra saccular device within left middle cerebral artery aneurysm. The Infinity sheath was removed. The 8 French Pinnacle sheath was then removed with the successful application of an 8 [place] Angio-Seal closure device for hemostasis. Distal pulses remained palpable in both feet unchanged. [name] ACT was in the region of approximately 250. This was partially reversed with 5 mg of protamine sulfate. [name] general anesthesia was slowly reversed. She was able to follow simple commands following the extubation. She was transferred to PACU for post extubation management. 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 PLAN: Patient to be left on aspirin 81 mg a day and Plavix 75 mg per day for 2 weeks. Follow-up in the clinic 2 weeks post discharge. Electronically Signed   By: Luanne Bras M.D.   On: 05/05/2020 09:52   EEG adult  Result Date: 05/05/2020 Lora Havens, MD     05/05/2020  9:26 AM Patient Name: Bethany [name] MRN: [id number] Epilepsy Attending: Lora Havens Referring Physician/Provider: Dr Antony Contras Date: 05/04/2020 Duration: 25.07 mins Patient history: 69 year old lady with known prior history of left MCA infarct with mild residual aphasia and right-sided weakness with sudden onset of altered  mental status following elective left middle cerebral artery bifurcation aneurysm successful coiling with exam consistent with aphasia and mild right-sided weakness and encephalopathy. EEG to evaluate for seizure Level of alertness: Awake AEDs during EEG study: GBP, PGB Technical aspects: This EEG study was done with scalp electrodes positioned according to the 10-20 International system of electrode placement. Electrical activity was acquired at a sampling rate of _0  and reviewed with a high frequency filter of _1  and a low frequency filter of _2 . EEG data were recorded continuously and digitally stored. Description: The posterior dominant rhythm consists of 8 Hz activity of moderate voltage (25-35 uV) seen predominantly in posterior head regions, symmetric and reactive to eye opening and eye closing. EEG showed continuous left temporal 3 to 6 Hz theta-delta slowing as well as intermittent generalized 3 to 6 Hz theta-delta slowing. Hyperventilation and photic stimulation were not performed.   ABNORMALITY -Continuous slow, left temporal IMPRESSION: This study is suggestive of cortical dysfunction in left temporal region secondary to underlying structural abnormality/stroke. Additionally there is mild diffuse encephalopathy, nonspecific etiology. No seizures or epileptiform discharges were seen throughout the recording. Bethany [name]   VAS [place] GROIN PSEUDOANEURYSM  Result Date: 05/05/2020  ARTERIAL PSEUDOANEURYSM  Exam: Right groin Indications: Patient complains of groin pain. History: Status post left carotid arterigram follow by emblomization left tMCA aneurysm. Comparison Study: No prior. Performing Technologist: Oda Cogan RDMS, RVT  Examination Guidelines: A complete evaluation includes B-mode imaging, spectral Doppler, color Doppler, and power Doppler as needed of all accessible portions of each vessel. Bilateral testing is considered  an integral part of a complete examination. Limited  examinations for reoccurring indications may be performed as noted. +------------+----------+--------+------+----------+ Right DuplexPSV (cm/s)WaveformPlaqueComment(s) +------------+----------+--------+------+----------+ CFA            116                    Patent   +------------+----------+--------+------+----------+ Prox SFA       188                    Patent   +------------+----------+--------+------+----------+ Right Vein comments:Patent right common femoral vein.  Summary: No evidence of right groin pseudoaneurysm or AVF.  Diagnosing physician: Harold Barban MD Electronically signed by Harold Barban MD on 05/05/2020 at 31:06:38 PM.   --------------------------------------------------------------------------------    Final    CT HEAD CODE STROKE WO CONTRAST  Result Date: 05/04/2020 CLINICAL DATA:  Code stroke.  Stroke follow-up. EXAM: CT HEAD WITHOUT CONTRAST TECHNIQUE: Contiguous axial images were obtained from the base of the skull through the vertex without intravenous contrast. COMPARISON:  CT head and MRI from July 6, 21. FINDINGS: Brain: Evolving encephalomalacia in the lateral left temporal lobe, in the region of the previously characterized infarct. Decreased edema and mass effect. No evidence of new/acute large vascular territory infarct. No acute hemorrhage. Vascular: No hyperdense vessel identified. Post-surgical change related to left MCA aneurysm embolization with a web flow disrupter device. Skull: No acute fracture. Sinuses/Orbits: Mild paranasal sinus mucosal thickening without air-fluid levels. Unremarkable orbits. Other: No mastoid effusions. ASPECTS [name] Stroke Program Early CT Score) total score (0-10 with 10 being normal): 10 IMPRESSION: 1. No evidence of acute intracranial abnormality. ASPECTS is 10. 2. Evolving encephalomalacia in the lateral left temporal lobe, in the region of the previously characterized infarct. 3. Post-surgical change related to left MCA  aneurysm embolization with a web flow disrupter device. Code stroke imaging results were communicated on 05/04/2020 at 2:22 pm to provider Dr. Leonie Man via telephone, who verbally acknowledged these results. Electronically Signed   By: Margaretha Sheffield MD   On: 05/04/2020 14:28   CT ANGIO HEAD CODE STROKE  Result Date: 05/04/2020 CLINICAL DATA:  Stroke suspected. EXAM: CT ANGIOGRAPHY HEAD AND NECK TECHNIQUE: Multidetector CT imaging of the head and neck was performed using the standard protocol during bolus administration of intravenous contrast. Multiplanar CT image reconstructions and MIPs were obtained to evaluate the vascular anatomy. Carotid stenosis measurements (when applicable) are obtained utilizing NASCET criteria, using the distal internal carotid diameter as the denominator. CONTRAST:  55m OMNIPAQUE IOHEXOL 350 MG/ML SOLN COMPARISON:  MRA November 20, 2019. CT chest June 15 21. FINDINGS: CTA NECK FINDINGS Aortic arch: Great vessel origins are patent. Right carotid system: No evidence of dissection, stenosis (50% or greater) or occlusion. Atherosclerosis at the bifurcation. Left carotid system: No evidence of dissection, stenosis (50% or greater) or occlusion. Atherosclerosis at the bifurcation. Vertebral arteries: Codominant. No evidence of dissection, stenosis (50% or greater) or occlusion. Skeleton: No acute findings. Other neck: No mass or suspicious adenopathy. Upper chest: Small bilateral pleural effusions. Patchy opacities in the lung apices may represent aspiration and/or pneumonia. Review of the MIP images confirms the above findings CTA HEAD FINDINGS Anterior circulation: No significant proximal stenosis or large vessel occlusion. Status post treatment of a left MCA aneurysm with web flow disrupted device. There is some expected flow within the aneurysm sac. Approximately 2 mm left posterior communicating artery aneurysm (see series 7, image 109). Distal left M2/M3 MCA branch stenosis was better  characterized  on the prior MRA. Posterior circulation: No significant stenosis, proximal occlusion, aneurysm, or vascular malformation. Mild stenosis of the basilar artery. Venous sinuses: As permitted by contrast timing, patent. Review of the MIP images confirms the above findings IMPRESSION: 1. No large vessel occlusion or new hemodynamically significant proximal stenosis in the head or neck. 2. Status post treatment of a left MCA aneurysm with web flow disrupted device. There is some expected flow within the aneurysm sac. 3. 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 4. Small bilateral pleural effusions. Patchy opacities in the lung apices may represent aspiration and/or pneumonia. Additionally, there appears to be a new 1 cm subpleural nodular opacity in the imaged lateral left upper lobe that is new since CT chest from October 29, 2019. While this most likely represents an infectious/inflammatory process, recommend follow-chest CT when the patient is able to exclude pulmonary nodule. Critical findings discussed with Dr. Marjory Lies at 2:24 PM and Dr. Leonie Man at 2:45 PM via telephone. Lung findings discussed with Dr. Leonie Man at 3:00 PM. Electronically Signed   By: Margaretha Sheffield MD   On: 05/04/2020 15:05   CT ANGIO NECK CODE STROKE  Result Date: 05/04/2020 CLINICAL DATA:  Stroke suspected. EXAM: CT ANGIOGRAPHY HEAD AND NECK TECHNIQUE: Multidetector CT imaging of the head and neck was performed using the standard protocol during bolus administration of intravenous contrast. Multiplanar CT image reconstructions and MIPs were obtained to evaluate the vascular anatomy. Carotid stenosis measurements (when applicable) are obtained utilizing NASCET criteria, using the distal internal carotid diameter as the denominator. CONTRAST:  31m OMNIPAQUE IOHEXOL 350 MG/ML SOLN COMPARISON:  MRA November 20, 2019. CT chest June 15  21. FINDINGS: CTA NECK FINDINGS Aortic arch: Great vessel origins are patent. Right carotid system: No evidence of dissection, stenosis (50% or greater) or occlusion. Atherosclerosis at the bifurcation. Left carotid system: No evidence of dissection, stenosis (50% or greater) or occlusion. Atherosclerosis at the bifurcation. Vertebral arteries: Codominant. No evidence of dissection, stenosis (50% or greater) or occlusion. Skeleton: No acute findings. Other neck: No mass or suspicious adenopathy. Upper chest: Small bilateral pleural effusions. Patchy opacities in the lung apices may represent aspiration and/or pneumonia. Review of the MIP images confirms the above findings CTA HEAD FINDINGS Anterior circulation: No significant proximal stenosis or large vessel occlusion. Status post treatment of a left MCA aneurysm with web flow disrupted device. There is some expected flow within the aneurysm sac. Approximately 2 mm left posterior communicating artery aneurysm (see series 7, image 109). Distal left M2/M3 MCA branch stenosis was better characterized on the prior MRA. Posterior circulation: No significant stenosis, proximal occlusion, aneurysm, or vascular malformation. Mild stenosis of the basilar artery. Venous sinuses: As permitted by contrast timing, patent. Review of the MIP images confirms the above findings IMPRESSION: 1. No large vessel occlusion or new hemodynamically significant proximal stenosis in the head or neck. 2. Status post treatment of a left MCA aneurysm with web flow disrupted device. There is some expected flow within the aneurysm sac. 3. 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 4. Small bilateral pleural effusions. Patchy opacities in the lung apices may represent aspiration and/or pneumonia. Additionally, there appears to be a new 1 cm subpleural nodular opacity in the imaged lateral left  upper lobe that is new since CT chest from October 29, 2019. While this most likely represents an infectious/inflammatory process,  recommend follow-chest CT when the patient is able to exclude pulmonary nodule. Critical findings discussed with Dr. Marjory Lies at 2:24 PM and Dr. Leonie Man at 2:45 PM via telephone. Lung findings discussed with Dr. Leonie Man at 3:00 PM. Electronically Signed   By: Margaretha Sheffield MD   On: 05/04/2020 15:05   IR ANGIO INTRA EXTRACRAN SEL INTERNAL CAROTID UNI L MOD SED  Result Date: 05/07/2020 CLINICAL DATA:  Patient with headaches and multiple intracranial aneurysms. Patient admitted for unruptured left middle cerebral artery aneurysm.  EXAM: TRANSCATHETER THERAPY EMBOLIZATION  COMPARISON:  Diagnostic catheter arteriogram of February 06, 2020.  MEDICATIONS: Heparin 3,000 units IV. Ancef 2 g IV antibiotic was administered within 1 hour.  ANESTHESIA/SEDATION: General anesthesia  CONTRAST:  Isovue 300 approximately 120 mL  FLUOROSCOPY TIME:  Fluoroscopy Time: 35 minutes 18 seconds (1457 mGy).  COMPLICATIONS: None immediate.  TECHNIQUE: Informed written consent was obtained from the patient after a thorough discussion of the procedural risks, benefits and alternatives. All questions were addressed. Maximal Sterile Barrier Technique was utilized including caps, mask, sterile gowns, sterile gloves, sterile drape, hand hygiene and skin antiseptic. A timeout was performed prior to the initiation of the procedure.  Initially a radial access was obtained with ultrasound guidance after having documented the right radial artery morphology with ultrasound. However, due to persistent vaso spasm induced by the micro guidewire, this approach was abandoned.  The right groin was prepped and draped in the usual sterile fashion. Thereafter using modified Seldinger technique, transfemoral access into the right common femoral artery was obtained without difficulty. Over a 0.035 inch guidewire, an 8  [place] 25 cm Pinnacle sheath was inserted. Through this, and also over 0.035 inch guidewire, a 5 [place] JB 1 catheter was advanced to the aortic arch region and selectively positioned in the left common carotid artery.  FINDINGS:.: FINDINGS:. The left common carotid arteriogram demonstrates the left external carotid artery and its major branches to be widely patent.  The left internal carotid artery at the bulb to the cranial skull base is also widely patent.  Patency is maintained into the petrous, cavernous and supraclinoid segments. Again demonstrated are 2 small saccular outpouchings projecting posteriorly from the supraclinoid left ICA, one in the region of the origin of the anterior choroidal artery.  The left middle cerebral artery and the left anterior cerebral artery opacify into the capillary and venous phases. Prompt filling via the anterior communicating artery of the right anterior cerebral A2 segment is seen.  Arising at the bifurcation of the left middle cerebral artery again noted is a saccular aneurysm with a relatively wide neck projecting slightly anteriorly.  A 3D rotational arteriogram was then performed from the left common carotid artery extending intracranially.  Reconstruction was then performed on a separate workstation. Optimal views were then obtained for treatment of the aneurysm. The aneurysm measured to be approximately 4.1 mm in its maximum width, and 3.82 mm in height. It was decided to use a WEB SL W5-4.3 for intra saccular disruption device for embolization.  ENDOVASCULAR EMBOLIZATION OF LEFT MIDDLE CEREBRAL ARTERY BIFURCATION REGION ANEURYSM  The diagnostic JB 1 catheter in the left common carotid artery was then exchanged over a 0.035 inch 300 cm Rosen exchange guidewire for a 90 cm 6 [place] Infinity sheath. This was advanced to the bifurcation of the left common carotid artery. The guidewire was removed. Good aspiration obtained from the hub of the Infinity  sheath. A gentle control arteriogram performed through this demonstrated no evidence of  vasospasm or of intraluminal filling defects. This was then connected to continuous heparinized saline infusion. Through the Infinity sheath, a combination of a 6 [place] 115 cm intermediary catheter inside of which was a Via 17 microcatheter was advanced over a 0.014 inch standard Synchro micro guidewire with a J configuration to the proximal cavernous segment. At this time, the Infinity guide sheath was advanced to the junction of the cervical petrous junction. Using biplane roadmap technique and constant fluoroscopic guidance, in a coaxial manner and with constant heparinized saline infusion, the micro guidewire was then gently advanced with a J-tip configuration to the left middle cerebral artery followed by the microcatheter. The micro guidewire was then gently advanced into the aneurysm followed by the microcatheter.  The guidewire was removed. Good aspiration obtained from the hub of the microcatheter. This was then connected to continuous heparinized saline infusion.  The WEB SL W5-4.3 intra saccular flow disrupter was then prepped and purged in heparinized saline. It was cleared of any air bubbles. This was then recaptured into the housing catheter.  This was then loaded onto the microcatheter after heparin flushed in the Tuohy Borst.  Smooth advancement was obtained to the middle cerebral artery very slowly ensuring no forward motion or movements of the microcatheter which was just inside the neck of the aneurysm.  The distal marker on the WEB device was advanced to the distal marker on the microcatheter.  The entire system was then straightened. With the microcatheter stable, the WEB device was advanced until early formation of a [place] configuration.  A control arteriogram performed through the [name] guide catheter demonstrated safe position and orientation of the device within the aneurysm.  The  microcatheter was then gently advanced further into the aneurysm followed by advancement of the WEB device into the aneurysm. Complete opening of the device was noted.  A control arteriogram performed through the [name] guide catheter demonstrated complete opening and apposition of the device into the aneurysm.  The superior and inferior branches of the left middle cerebral artery were patent without evidence of encroachment on the bifurcation branches.  With the WEB device well oriented in the AP and lateral projections, the microcatheter was gently withdrawn to just proximal to the proximal marker on the WEB device. Again the orientation of the WEB device within the aneurysm remained unchanged.  A control arteriogram performed through the [facility] guide catheter demonstrated excellent apposition on the AP and lateral projections with contrast stasis within the aneurysm.  Under constant biplane fluoroscopy, the device was disconnected and deployed using the detachment device at the proximal aspect of the delivery wire.  The micro guidewire was gently advanced just distal to the microcatheter then withdrawn.  A control arteriogram was then performed through the [name] guide catheter in the left internal carotid artery which continued to demonstrate excellent flow through the left middle cerebral artery distribution with patency of the superior and inferior divisions, and stasis within the aneurysm itself.  Control arteriograms were then performed at 10 and 20 minutes post deployment of the device. These continued to demonstrated no evidence of intraluminal filling defects, or of occlusions in the left middle cerebral artery and the left anterior cerebral artery distributions.  The [place] 6 [place] guide catheter was removed. The Infinity sheath was then retrieved to just proximal to the left common carotid artery bifurcation. Control arteriogram performed through this continued to demonstrate wide patency of the  of the left internal carotid artery proximally and distally with no change  in the intra saccular device within left middle cerebral artery aneurysm.  The Infinity sheath was removed. The 8 French Pinnacle sheath was then removed with the successful application of an 8 French Angio-Seal closure device for hemostasis. Distal pulses remained palpable in both feet unchanged.  [name] ACT was in the region of approximately 250. This was partially reversed with 5 mg of protamine sulfate.  [name] general anesthesia was slowly reversed. She was able to follow simple commands following the extubation. She was transferred to PACU for post extubation management.  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  PLAN: Patient to be left on aspirin 81 mg a day and Plavix 75 mg per day for 2 weeks. Follow-up in the clinic 2 weeks post discharge.   Electronically Signed   By: Luanne Bras M.D.   On: 05/05/2020 09:52   Labs:  CBC: Recent Labs    04/24/20 1225 04/27/20 0719 05/04/20 1645 05/05/20 0436  WBC 7.2 12.1* 14.0* 14.3*  HGB 6.8* 10.1* 8.8* 7.8*  HCT 25.0* 33.6* 29.7* 27.5*  PLT 277 296 229 200    COAGS: Recent Labs    11/19/19 1317 02/06/20 0828 04/27/20 0719  INR 1.0 1.0 1.1  APTT 26  --  29    BMP: Recent Labs    11/20/19 0433 11/22/19 1139 01/24/20 1212 02/06/20 0718 02/06/20 1124 04/27/20 0719 05/04/20 1645 05/05/20 0436  NA 139 136 139 137 142 138 137 140  K 4.2 4.2 4.6 6.4* 5.4* 4.9 5.0 4.4  CL 103 103 107 103 108 105 108 109  CO2 _0 --  24 21* 21*  GLUCOSE 131* 169* 232* 410* 269* 228* 242* 106*  BUN 16 12 26* 28* 31* 24* 27* 22  CALCIUM 9.9 9.8 10.8* 10.4*  --  10.1 8.7* 8.8*  CREATININE 1.01*  1.04* 1.03* 1.21* 1.56* 1.30* 1.58* 1.26* 1.12*  GFRNONAA 57*  55* 55* 46* 34*  --  35* 46* 53*  GFRAA >60  >60 >60 53* 39*  --   --    --   --     LIVER FUNCTION TESTS: Recent Labs    07/19/19 1340 11/19/19 1317 01/24/20 1212 05/04/20 1645  BILITOT 0.5 0.7 0.3 0.6  AST 77* _1 ALT 119* 23 56* 65*  ALKPHOS 330* 159* 237* 192*  PROT 7.4 6.9 7.2 5.3*  ALBUMIN 3.9 3.9 3.9 3.0*    Assessment and Plan:  Left MCA aneurysms/p endovascular embolizationusing WEB device via right femoral approach 12/20/2021by Dr. Estanislado Pandy. Patient neurologically intact and stable from procedure standpoint (right femoral puncture area tender but stable, [place] negative for pseudoaneurysm), however not fit to safely go home (CIR recommended by PT/OT/CIR PA/MD). Patient agreeable with program per notes and discussion with her today. Discussed with CIR coordinator who states insurance pending at this time. Will transfer to progressive and request TRH see patient for management of comorbidities (DM, COPD, chronic pain management)- appreciate assistance with this patient. Continue taking Plavix 75 mg once daily and Aspirin 81 mg once daily. NIR to follow.   Electronically Signed: Earley Abide, PA-C 05/07/2020, 9:29 AM   I spent a total of 25 Minutes at the the patient's bedside AND on the patient's hospital floor or unit, greater than 50% of which was counseling/coordinating care for left MCA aneurysm s/p embolization.

## 2020-05-07 NOTE — Progress Notes (Signed)
Physical Therapy Treatment Patient Details Name: Bethany [name] MRN: [id number] DOB: [birth year] Today's Date: 05/07/2020    History of Present Illness 69 yo female with L MCA aneurysm s/p endovascular embolization using WEB device via right femoral approach 05/04/2020. post-operatively, pt with RUE/LE weakness and aphasia, resolving since, no acute CVA appreciated on CT. 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    PT Comments    The pt was eager and willing to participate in PT session with focus on progression of OOB mobility and stability at this time. She was able to complete multiple bouts of ambulation in her room with use of RW and minA/minG to steady. She continues to present with deficits in power, strength, stability, coordination, and safety awareness that all impact her ability to move safely and independently as she would need to return home to living alone. The pt is at significantly increased risk for falls without 24/7 supervision and assist for mobility, and therefore I continue to recommend CIR to facilitate return to pt's prior level of mobility and independence. The pt remains in agreement with this plan.     Follow Up Recommendations  CIR     Equipment Recommendations  None recommended by PT    Recommendations for Other Services       Precautions / Restrictions Precautions Precautions: Fall Restrictions Weight Bearing Restrictions: No    Mobility  Bed Mobility Overal bed mobility: Needs Assistance Bed Mobility: Supine to Sit     Supine to sit: Min guard     General bed mobility comments: no physical assist give, pt able to steady without assist from PT  Transfers Overall transfer level: Needs assistance Equipment used: Rolling walker (2 wheeled) Transfers: Sit to/from Stand Sit to Stand: Min guard         General transfer comment: no physical assist given, but significant increased time to power up and  repeated cues for hand placement/technique.  Ambulation/Gait Ambulation/Gait assistance: Min assist;+2 safety/equipment (chair follow to progress) Gait Distance (Feet): 25 Feet (X2)   Gait Pattern/deviations: Step-through pattern;Decreased stride length;Drifts right/left;Trunk flexed Gait velocity: decr, unsteady Gait velocity interpretation: <1.31 ft/sec, indicative of household ambulator General Gait Details: minA to steady, VC for posture. Pt prefers truncal and cervical hyperextension as she reports this reduces pain. R knee with significant instability and hyperextension with poor muscular stability/activation to stabilize       Balance Overall balance assessment: Needs assistance Sitting-balance support: No upper extremity supported Sitting balance-Leahy Scale: Fair     Standing balance support: Single extremity supported;During functional activity Standing balance-Leahy Scale: Poor Standing balance comment: reliant on external assist in standing                            Cognition Arousal/Alertness: Awake/alert Behavior During Therapy: WFL for tasks assessed/performed Overall Cognitive Status: Impaired/Different from baseline Area of Impairment: Following commands;Safety/judgement;Problem solving                       Following Commands: Follows one step commands with increased time Safety/Judgement: Decreased awareness of deficits;Decreased awareness of safety   Problem Solving: Requires verbal cues;Requires tactile cues;Difficulty sequencing;Slow processing General Comments: pt alert and oriented, [facility] conversationally. but with decreased insight to deficits, need for increased safety measures with mobility at this time. Pt benefits from increased time and repeated cues at times for following of commands  Exercises      General Comments General comments (skin integrity, edema, etc.): VSS on RA, pt brother present and supportive. discussed  benefits of CIR at length with pt and her brother      Pertinent Vitals/Pain Pain Assessment: Faces Faces Pain Scale: Hurts a little bit Pain Location: L side neck Pain Descriptors / Indicators: Discomfort;Grimacing;Headache;Heaviness Pain Intervention(s): Limited activity within patient's tolerance;Monitored during session;Repositioned           PT Goals (current goals can now be found in the care plan section) Acute Rehab PT Goals Patient Stated Goal: go home PT Goal Formulation: With patient Time For Goal Achievement: 05/19/20 Potential to Achieve Goals: Good Progress towards PT goals: Progressing toward goals    Frequency    Min 4X/week      PT Plan Current plan remains appropriate       AM-PAC PT "6 Clicks" Mobility   Outcome Measure  Help needed turning from your back to your side while in a flat bed without using bedrails?: A Little Help needed moving from lying on your back to sitting on the side of a flat bed without using bedrails?: A Little Help needed moving to and from a bed to a chair (including a wheelchair)?: A Little Help needed standing up from a chair using your arms (e.g., wheelchair or bedside chair)?: A Little Help needed to walk in hospital room?: A Little Help needed climbing 3-5 steps with a railing? : A Lot 6 Click Score: 17    End of Session Equipment Utilized During Treatment: Gait belt Activity Tolerance: Patient limited by fatigue Patient left: in chair;with chair alarm set;with call bell/phone within reach;with family/visitor present Nurse Communication: Mobility status PT Visit Diagnosis: Other abnormalities of gait and mobility (R26.89);Muscle weakness (generalized) (M62.81)     Time: [id number] PT Time Calculation (min) (ACUTE ONLY): 29 min  Charges:  $Gait Training: 8-22 mins $Therapeutic Activity: 8-22 mins                     Bethany [name], PT, DPT   Acute Rehabilitation Department Pager #: [phone]   Bethany [name] 05/07/2020, 1:55 PM

## 2020-05-07 NOTE — Progress Notes (Signed)
Occupational Therapy Treatment Patient Details Name: Bethany [name] MRN: [id number] DOB: 07/08/1950 Today's Date: 05/07/2020    History of present illness 69 yo female with L MCA aneurysm s/p endovascular embolization using WEB device via right femoral approach 05/04/2020. post-operatively, pt with RUE/LE weakness and aphasia, resolving since, no acute CVA appreciated on CT. 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   OT comments  Pt is making excellent progress toward goals and is able to perform ADLs with min guard to mod A with use of RW, and is heavily reliant on use of UEs when ambulating.  She is fairly unsteady.  I anticipate as she fatigues throughout the day her level of care will also increase as will her risk of falls.  Recommend CIR level rehab to allow her to maximize her independence to reduce risk of falls, injury, and readmission.  Will follow.   Follow Up Recommendations  CIR    Equipment Recommendations  3 in 1 bedside commode    Recommendations for Other Services      Precautions / Restrictions Precautions Precautions: Fall Restrictions Weight Bearing Restrictions: No       Mobility Bed Mobility Overal bed mobility: Needs Assistance Bed Mobility: Supine to Sit     Supine to sit: Min guard     General bed mobility comments: up in chair  Transfers Overall transfer level: Needs assistance Equipment used: Rolling walker (2 wheeled) Transfers: Sit to/from Stand Sit to Stand: Min guard         General transfer comment: no physical assist given, but significant increased time to power up and repeated cues for hand placement/technique. Pt unsteady    Balance Overall balance assessment: Needs assistance Sitting-balance support: No upper extremity supported Sitting balance-Leahy Scale: Fair     Standing balance support: Single extremity supported;During functional activity Standing balance-Leahy Scale:  Poor Standing balance comment: reliant on external assist in standing                           ADL either performed or assessed with clinical judgement   ADL Overall ADL's : Needs assistance/impaired Eating/Feeding: Set up;Sitting   Grooming: Wash/dry hands;Wash/dry face;Oral care;Minimal assistance;Standing Grooming Details (indicate cue type and reason): min A for balance     Lower Body Bathing: Moderate assistance;Sit to/from stand;+2 for safety/equipment       Lower Body Dressing: Moderate assistance;+2 for safety/equipment;Sit to/from stand   Toilet Transfer: Minimal assistance;Ambulation;Comfort height toilet;Regular Toilet;BSC;Grab bars;RW Statistician Details (indicate cue type and reason): heavy reliance on UEs, with pt unsteady with use of RW         Functional mobility during ADLs: Minimal assistance;Rolling walker       Vision       Perception     Praxis      Cognition Arousal/Alertness: Awake/alert Behavior During Therapy: WFL for tasks assessed/performed Overall Cognitive Status: Impaired/Different from baseline Area of Impairment: Following commands;Safety/judgement;Problem solving                       Following Commands: Follows one step commands with increased time Safety/Judgement: Decreased awareness of deficits;Decreased awareness of safety   Problem Solving: Requires verbal cues;Requires tactile cues;Difficulty sequencing;Slow processing General Comments: Pt initially downplayed her deficits and demonstrated decreased ability to generalize how her deficits may impact her functionally at home.  She is slow to process info at times.  As the  session progressed her awareness of deficit improved        Exercises     Shoulder Instructions       General Comments Pt's brother present.  Long discussion with pt and brother re: current functional level and need for 24 hour assist at discharge as she is at significant risk  for falls    Pertinent Vitals/ Pain       Pain Assessment: Faces Faces Pain Scale: Hurts a little bit Pain Location: L side neck Pain Descriptors / Indicators: Discomfort;Grimacing;Headache;Heaviness Pain Intervention(s): Monitored during session  Home Living                                          Prior Functioning/Environment              Frequency  Min 2X/week        Progress Toward Goals  OT Goals(current goals can now be found in the care plan section)  Progress towards OT goals: Progressing toward goals  Acute Rehab OT Goals Patient Stated Goal: go home  Plan Discharge plan remains appropriate    Co-evaluation                 AM-PAC OT "6 Clicks" Daily Activity     Outcome Measure   Help from another person eating meals?: A Little Help from another person taking care of personal grooming?: A Little Help from another person toileting, which includes using toliet, bedpan, or urinal?: A Little Help from another person bathing (including washing, rinsing, drying)?: A Little Help from another person to put on and taking off regular upper body clothing?: A Little Help from another person to put on and taking off regular lower body clothing?: A Lot 6 Click Score: 17    End of Session Equipment Utilized During Treatment: Gait belt;Rolling walker  OT Visit Diagnosis: Unsteadiness on feet (R26.81)   Activity Tolerance Patient tolerated treatment well   Patient Left in chair;with call bell/phone within reach;with chair alarm set;with family/visitor present   Nurse Communication Mobility status        Time: 6283-1517 OT Time Calculation (min): 23 min  Charges: OT General Charges $OT Visit: 1 Visit OT Treatments $Self Care/Home Management : 38-52 mins  Nilsa Nutting., OTR/L Acute Rehabilitation Services Pager [phone] Office [phone]    Lucille Passy M 05/07/2020, 2:26 PM

## 2020-05-07 NOTE — Plan of Care (Signed)
  Problem: Health Behavior/Discharge Planning: °Goal: Ability to manage health-related needs will improve °Outcome: Progressing °  °Problem: Clinical Measurements: °Goal: Ability to maintain clinical measurements within normal limits will improve °Outcome: Progressing °Goal: Will remain free from infection °Outcome: Progressing °  °Problem: Nutrition: °Goal: Adequate nutrition will be maintained °Outcome: Progressing °  °

## 2020-05-07 NOTE — PMR Pre-admission (Incomplete)
PMR Admission Coordinator Pre-Admission Assessment  Patient: Bethany [name] is an 69 y.o., female MRN: [id number] DOB: 04-18-1951 Height: '5\' 5"'  (165.1 cm) Weight: 81.8 kg              Insurance Information HMO: yes    PPO:      PCP:      IPA:      80/20:      OTHER:  PRIMARY: [employer] Medicare      Policy#: [id number]      Subscriber: patient CM Name: ***      Phone#: ***     Fax#: *** Pre-Cert#: [id number]      Employer:  Benefits:  Phone #: online     Name: [email or web address] Eff. Date: 01/15/2020-05/15/2020     Deduct: $0 (does not have deductible)      Out of Pocket Max: $7,550 ($5,234.98 met)      Life Max: NA  CIR: $1400 copay, 0% coinsurance      SNF: $0 co-pay for days 1-20,$185.50 co-pay 21-100 days, limit 100 days Outpatient: 20% co-insurance, 80% coverage      Home Health:$0 co-pay, 80% coverage, 20% co-ins      Co- DME: 20% co insurance, 80% coverage Providers:  SECONDARY: None      Policy#:       Phone#:   Development worker, community:       Phone#:   The Engineer, [employer]" for patients in Inpatient Rehabilitation Facilities with attached "Privacy Act [place] Records" was provided and verbally reviewed with: {CHL IP Patient Family OL:[id number]}  Emergency Contact Information Contact Information    Name Relation Home Work Mobile   [place] Brother [phone]  [place], [place] Daughter [phone]  [phone]   [place] Sister [phone]  [phone]   Gloris Ham Relative [phone]  [phone]     Current Medical History  Patient Admitting Diagnosis: Brain aneurysm  History of Present Illness: *** Complete NIHSS TOTAL: 0 Glasgow Coma Scale Score: 15  Past Medical History  Past Medical History:  Diagnosis Date  . Acute renal failure ([place])   . Anemia   . Arthritis   . COPD (chronic obstructive pulmonary disease) ([place])   . Diabetes mellitus without complication ([place])    type II   . GERD  (gastroesophageal reflux disease)   . Hypercalcemia   . Hyperlipidemia   . Hypertension   . Pneumonia   . Renal disorder   . Single kidney   . Stroke [facility])    July 2021  . Thrombosis   . Tobacco abuse     Family History  family history includes Breast cancer in her maternal aunt; Breast cancer (age of onset: 26) in her maternal aunt; Diabetes in her father; Hypertension in her brother and mother.  Prior Rehab/Hospitalizations:  Has the patient had prior rehab or hospitalizations prior to admission? Yes  Has the patient had major surgery during 100 days prior to admission? Yes  Current Medications   Current Facility-Administered Medications:  .  0.9 %  sodium chloride infusion (Manually program via Guardrails IV Fluids), , Intravenous, Once, Holtzman, Ariel Leffew, CRNA, Held at 05/04/20 1526 .  0.9 %  sodium chloride infusion, , Intravenous, Continuous, Louk, Alexandra M, PA-C, Stopped at 05/05/20 1336 .  acetaminophen (TYLENOL) tablet 650 mg, 650 mg, Oral, Q4H PRN, 650 mg at 05/06/20 1126 **OR** acetaminophen (TYLENOL) 160 MG/5ML solution 650 mg, 650 mg, Per Tube, Q4H PRN **OR** acetaminophen (TYLENOL) suppository 650 mg, 650 mg, Rectal,  Q4H PRN, Louk, Alexandra M, PA-C .  allopurinol (ZYLOPRIM) tablet 100 mg, 100 mg, Oral, QHS, Louk, Alexandra M, PA-C, 100 mg at 05/06/20 2240 .  amLODipine (NORVASC) tablet 10 mg, 10 mg, Oral, Daily, Louk, Alexandra M, PA-C, 10 mg at 05/07/20 0858 .  aspirin EC tablet 81 mg, 81 mg, Oral, Daily, Louk, Alexandra M, PA-C, 81 mg at 05/07/20 0859 .  atorvastatin (LIPITOR) tablet 80 mg, 80 mg, Oral, Daily, Louk, Alexandra M, PA-C, 80 mg at 05/07/20 0857 .  baclofen (LIORESAL) tablet 10 mg, 10 mg, Oral, TID PRN, Louk, Alexandra M, PA-C, 10 mg at 05/06/20 1524 .  Chlorhexidine Gluconate Cloth 2 % PADS 6 each, 6 each, Topical, Daily, Deveshwar, Sanjeev, MD, 6 each at 05/07/20 0730 .  clevidipine (CLEVIPREX) infusion 0.5 mg/mL, 0-21 mg/hr, Intravenous,  Continuous, Wilson Singer I, [facility], Stopped at 05/05/20 1112 .  clopidogrel (PLAVIX) tablet 75 mg, 75 mg, Oral, Daily, Louk, Alexandra M, PA-C, 75 mg at 05/07/20 0858 .  diphenhydrAMINE (BENADRYL) capsule 50 mg, 50 mg, Oral, Q6H PRN, Louk, Alexandra M, PA-C .  ezetimibe (ZETIA) tablet 10 mg, 10 mg, Oral, Daily, Louk, Alexandra M, PA-C, 10 mg at 05/07/20 0858 .  furosemide (LASIX) tablet 40 mg, 40 mg, Oral, Daily PRN, Louk, Alexandra M, PA-C .  gabapentin (NEURONTIN) capsule 400 mg, 400 mg, Oral, QID, Deveshwar, Sanjeev, MD, 400 mg at 05/07/20 0858 .  insulin aspart (novoLOG) injection 0-15 Units, 0-15 Units, Subcutaneous, Q4H, Luanne Bras, MD, 3 Units at 05/07/20 0850 .  Ipratropium-Albuterol (COMBIVENT) respimat 1 puff, 1 puff, Inhalation, Q6H PRN, Louk, Alexandra M, PA-C .  levothyroxine (SYNTHROID) tablet 75 mcg, 75 mcg, Oral, Daily, Louk, Alexandra M, PA-C, 75 mcg at 05/07/20 0851 .  losartan (COZAAR) tablet 25 mg, 25 mg, Oral, Daily, Louk, Alexandra M, PA-C, 25 mg at 05/07/20 0857 .  metoprolol tartrate (LOPRESSOR) tablet 50 mg, 50 mg, Oral, BID, Louk, Alexandra M, PA-C, 50 mg at 05/07/20 0859 .  ondansetron (ZOFRAN) injection 4 mg, 4 mg, Intravenous, Q6H PRN, Louk, Alexandra M, PA-C .  oxyCODONE (OXYCONTIN) 12 hr tablet 30 mg, 30 mg, Oral, Q12H, Louk, Alexandra M, PA-C, 30 mg at 05/07/20 0857 .  pantoprazole (PROTONIX) EC tablet 40 mg, 40 mg, Oral, Daily, Louk, Alexandra M, PA-C, 40 mg at 05/07/20 1014 .  pregabalin (LYRICA) capsule 25 mg, 25 mg, Oral, BID, Louk, Alexandra M, PA-C, 25 mg at 05/07/20 0859 .  senna-docusate (Senokot-S) tablet 1 tablet, 1 tablet, Oral, QHS, Louk, Alexandra M, PA-C, 1 tablet at 05/06/20 2240  Patients Current Diet:  Diet Order            Diet Heart Room service appropriate? Yes with Assist; Fluid consistency: Thin  Diet effective now                 Precautions / Restrictions Precautions Precautions: Fall Restrictions Weight Bearing  Restrictions: No   Has the patient had 2 or more falls or a fall with injury in the past year?No  Prior Activity Level Community (5-7x/wk): Independent, no AD PTA. retired. still drives.  Prior Functional Level Prior Function Level of Independence: Independent  Self Care: Did the patient need help bathing, dressing, using the toilet or eating?  Independent  Indoor Mobility: Did the patient need assistance with walking from room to room (with or without device)? Independent  Stairs: Did the patient need assistance with internal or external stairs (with or without device)? Independent  Functional Cognition: Did the patient need help  planning regular tasks such as shopping or remembering to take medications? Independent  Home Assistive Devices / Equipment Home Assistive Devices/Equipment: Eyeglasses,CBG Meter,Dentures (specify type),Cane (specify quad or straight) Home Equipment: Cane - single point  Prior Device Use: Indicate devices/aids used by the patient prior to current illness, exacerbation or injury? None of the above  Current Functional Level Cognition  Overall Cognitive Status: Impaired/Different from baseline Orientation Level: Oriented X4 Following Commands: Follows one step commands with increased time Safety/Judgement: Decreased awareness of deficits,Decreased awareness of safety General Comments: Pt initially downplayed her deficits and demonstrated decreased ability to generalize how her deficits may impact her functionally at home.  She is slow to process info at times.  As the session progressed her awareness of deficit improved    Extremity Assessment (includes Sensation/Coordination)  Upper Extremity Assessment: Generalized weakness RUE Deficits / Details: decreased fine motor/coordination noted, but is functional given increased time LUE Deficits / Details: decreased fine motor/coordination noted, but is functional given increased time  Lower Extremity  Assessment: Defer to PT evaluation    ADLs  Overall ADL's : Needs assistance/impaired Eating/Feeding: Set up,Sitting Grooming: Wash/dry hands,Wash/dry face,Oral care,Minimal assistance,Standing Grooming Details (indicate cue type and reason): min A for balance Upper Body Bathing: Min guard,Sitting Lower Body Bathing: Moderate assistance,Sit to/from stand,+2 for safety/equipment Upper Body Dressing : Sitting,Min guard Lower Body Dressing: Moderate assistance,+2 for safety/equipment,Sit to/from stand Lower Body Dressing Details (indicate cue type and reason): assist for socks today Toilet Transfer: Minimal assistance,Ambulation,Comfort height toilet,Regular Toilet,BSC,Grab bars,RW Toilet Transfer Details (indicate cue type and reason): heavy reliance on UEs, with pt unsteady with use of RW Toileting- Clothing Manipulation and Hygiene: Minimal assistance,+2 for safety/equipment,Sit to/from stand Toileting - Clothing Manipulation Details (indicate cue type and reason): pt performing pericare in standing with minA for balance Functional mobility during ADLs: Minimal assistance,Rolling walker    Mobility  Overal bed mobility: Needs Assistance Bed Mobility: Supine to Sit Supine to sit: Min guard General bed mobility comments: up in chair    Transfers  Overall transfer level: Needs assistance Equipment used: Rolling walker (2 wheeled) Transfers: Sit to/from Stand Sit to Stand: Min guard General transfer comment: no physical assist given, but significant increased time to power up and repeated cues for hand placement/technique. Pt unsteady    Ambulation / Gait / Stairs / Wheelchair Mobility  Ambulation/Gait Ambulation/Gait assistance: Min assist,+2 safety/equipment (chair follow to progress) Gait Distance (Feet): 25 Feet (X2) Assistive device: Rolling walker (2 wheeled) Gait Pattern/deviations: Step-through pattern,Decreased stride length,Drifts right/left,Trunk flexed General Gait  Details: minA to steady, VC for posture. Pt prefers truncal and cervical hyperextension as she reports this reduces pain. R knee with significant instability and hyperextension with poor muscular stability/activation to stabilize Gait velocity: decr, unsteady Gait velocity interpretation: <1.31 ft/sec, indicative of household ambulator    Posture / Balance Balance Overall balance assessment: Needs assistance Sitting-balance support: No upper extremity supported Sitting balance-Leahy Scale: Fair Standing balance support: Single extremity supported,During functional activity Standing balance-Leahy Scale: Poor Standing balance comment: reliant on external assist in standing    Special needs/care consideration {Special Care Needs/Care Considerations:[id number]}     Previous Home Environment (from acute therapy documentation) Living Arrangements: Alone Available Help at Discharge: Family (dtr lives across the hall) Type of Home: Apartment Home Layout: One level Home Access: Level entry Bathroom Shower/Tub: Chiropodist: Standard  Discharge Living Setting Plans for Discharge Living Setting: Alone,Apartment Type of Home at Discharge: Apartment Discharge Home Layout: One level Discharge Home Access:  Stairs to enter Entrance Stairs-Rails: None Entrance Stairs-Number of Steps: 2 Discharge Bathroom Shower/Tub: Tub/shower unit Discharge Bathroom Toilet: Standard Discharge Bathroom Accessibility: Yes How Accessible: Accessible via walker Does the patient have any problems obtaining your medications?: Yes (Describe) (reports some financial assistance needed)  Social/Family/Support Systems Patient Roles: Other (Comment) (close to daughter) Contact Information: daughter: Demetrius Revel: [phone] Anticipated Caregiver: daughter + son Anticipated Caregiver's Contact Information: see above Ability/Limitations of Caregiver: Supervision Caregiver Availability:  (very close to  24/7 supervision (Son works 3rd shift) (daughter works first shift). plan for someone to be with her at DC) Discharge Plan Discussed with Primary Caregiver: Yes (pt and daughter) Is Caregiver In Agreement with Plan?: Yes Does Caregiver/Family have Issues with Lodging/Transportation while Pt is in Rehab?: No   Goals Patient/Family Goal for Rehab: PT/OT: Mod I; SLP: Mod I Expected length of stay: 7 days Pt/Family Agrees to Admission and willing to participate: Yes Program Orientation Provided & Reviewed with Pt/Caregiver Including Roles  & Responsibilities: Yes  Barriers to Discharge: Lack of/limited family support,Home environment access/layout  Barriers to Discharge Comments: stairs to enter;   Decrease burden of Care through IP rehab admission: Other NA   Possible need for SNF placement upon discharge:Not anticipated; Pt has good social support at DC. Anticipate pt can reach a Mod I level through CIR program.    Patient Condition: {PATIENT'S CONDITION:22832}  Preadmission Screen Completed By:  Raechel Ache, OT, 05/07/2020 2:28 PM ______________________________________________________________________   Discussed status with Dr. Marland Kitchenon***at *** and received approval for admission today.  Admission Coordinator:  Raechel Ache, time***/Date***

## 2020-05-07 NOTE — Progress Notes (Signed)
Inpatient Rehabilitation-Admissions Coordinator   Met with pt bedside as follow up from PM&R consult. Please see formal consult by Dr. Ranell Patrick on 05/06/20 for details. Discussed recommended rehab program, details, expectations, anticipated LOS, and expected functional outcomes. Pt somewhat interested in the program and has agreed to letting us open her insurance case. Pt aware we will continue to follow her progress with therapies while we are awaiting an insurance determination. Confirmed DC support with pt's daughter. AC will begin insurance auth process for possible admit.   Raechel Ache, OTR/L  Rehab Admissions Coordinator  [phone] 05/07/2020 11:07 AM

## 2020-05-07 NOTE — Consult Note (Signed)
Medical Consultation   Bethany [name]  [id number]  DOB: [birth year]  DOA: 05/04/2020  PCP: Wenda Low, MD   Outpatient Specialists: Deveshwar - IR; [facility] - oncology; [facility] - cardiology; Leonie Man - neurology   Requesting physician: Estanislado Pandy - IR  Reason for consultation: Patient with aneurysm coiling and post-procedure speech issues, symptoms resolved.  Neurology signed off.  PT/OT recommends CIR for deconditioning.  Has many chronic medical problems.  History of Present Illness: Bethany [name] is an 69 y.o. female with h/o CVA (11/2019); solitary kidney; HTN; HLD; DM; iron deficiency anemia; chronic diastolic CHF; and COPD who presented on 12/20 for endovascular embolization of left MCA aneurysm and post-procedure CVA.  She reports that PTA she was independent - no cane or walker, lives alone. She has been having headaches along her left neck and posterior head for the last few weeks.  She presented for elective aneurysmal repair.  Post-procedure, she had some dysarthria which has resolved.  When PT got her up on post-procedure day 3, she was unsteady and fatigued easily (12/21).  On repeat evaluation on 12/22, she continued to demonstrate significant gait instability and persistent fall risk.  She was recommended for CIR.    Review of Systems:  ROS As per HPI otherwise 10 point review of systems negative.    Past Medical History: Past Medical History:  Diagnosis Date  . Acute renal failure ([place])   . Anemia   . Arthritis   . COPD (chronic obstructive pulmonary disease) ([place])   . Diabetes mellitus without complication ([place])    type II   . GERD (gastroesophageal reflux disease)   . Hypercalcemia   . Hyperlipidemia   . Hypertension   . Pneumonia   . Renal disorder   . Single kidney   . Stroke [facility])    July 2021  . Thrombosis   . Tobacco abuse     Past Surgical History: Past Surgical History:  Procedure Laterality Date  . ABDOMINAL HYSTERECTOMY    .  blood clots removed from descending aorta     . CHOLECYSTECTOMY    . COLONOSCOPY    . COLONOSCOPY WITH PROPOFOL N/A 04/17/2017   Procedure: COLONOSCOPY WITH PROPOFOL;  Surgeon: Wilford Corner, MD;  Location: [facility];  Service: Endoscopy;  Laterality: N/A;  . ESOPHAGOGASTRODUODENOSCOPY (EGD) WITH PROPOFOL N/A 04/17/2017   Procedure: ESOPHAGOGASTRODUODENOSCOPY (EGD) WITH PROPOFOL;  Surgeon: Wilford Corner, MD;  Location: [facility];  Service: Endoscopy;  Laterality: N/A;  . IR 3D INDEPENDENT WKST  05/04/2020  . IR ANGIO INTRA EXTRACRAN SEL COM CAROTID INNOMINATE BILAT MOD SED  02/06/2020  . IR ANGIO INTRA EXTRACRAN SEL INTERNAL CAROTID UNI L MOD SED  05/04/2020  . IR ANGIO VERTEBRAL SEL VERTEBRAL BILAT MOD SED  02/06/2020  . IR ANGIOGRAM FOLLOW UP STUDY  05/04/2020  . IR IMAGING GUIDED PORT INSERTION  05/01/2019  . IR NEURO EACH ADD'L AFTER BASIC UNI LEFT (MS)  05/04/2020  . IR TRANSCATH/EMBOLIZ  05/04/2020  . IR US GUIDE VASC ACCESS RIGHT  02/06/2020  . IR US GUIDE VASC ACCESS RIGHT  05/04/2020  . NEPHRECTOMY RECIPIENT    . RADIOLOGY WITH ANESTHESIA N/A 05/04/2020   Procedure: Sheppard Plumber;  Surgeon: Luanne Bras, MD;  Location: [place];  Service: Radiology;  Laterality: N/A;     Allergies:   Allergies  Allergen Reactions  . Contrast Media [Iodinated Diagnostic Agents]     Renal hypertension per  physician     Social History:  reports that she has been smoking cigarettes. She has a 10.00 pack-year smoking history. She has never used smokeless tobacco. She reports current alcohol use. She reports that she does not use drugs.   Family History: Family History  Problem Relation Age of Onset  . Hypertension Mother   . Diabetes Father   . Hypertension Brother   . Breast cancer Maternal Aunt        76s  . Breast cancer Maternal Aunt 75       colon cancer      Physical Exam: Vitals:   05/07/20 0600 05/07/20 0700 05/07/20 0800 05/07/20 0900  BP: (!) 152/56 (!)  157/57 (!) 143/37 (!) 164/93  Pulse: (!) 57 (!) 57 (!) 58 61  Resp: 15 17 14 16   Temp:   98.7 F (37.1 C)   TempSrc:   Oral   SpO2: 95% 94% 99% 100%  Weight:      Height:        Constitutional: Alert and awake, oriented x3, not in any acute distress. Eyes: EOMI, irises appear normal, anicteric sclera ENMT: external ears and nose appear normal, normal hearing, Lips appear normal, oropharynx mucosa, tongue appear normal  Neck: neck appears normal, no masses, mild TTP  CVS: S1-S2 clear, no murmur rubs or gallops Respiratory:  clear to auscultation bilaterally, no wheezing, rales or rhonchi. Respiratory effort normal. No accessory muscle use.  Abdomen: soft nontender, nondistended Musculoskeletal: : no cyanosis, clubbing or edema noted bilaterally Neuro: Cranial nerves II-XII grossly intact Psych: judgement and insight appear normal, stable mood and affect, mental status Skin: no rashes or lesions or ulcers, no induration or nodules    Data reviewed:  I have personally reviewed the recent labs and imaging studies  Pertinent Labs:   12/21 lipids: 130/30/68/160 12/21 GFR 53 Glucose persistently in 15-238 range   Inpatient Medications:   Scheduled Meds: . sodium chloride   Intravenous Once  . allopurinol  100 mg Oral QHS  . amLODipine  10 mg Oral Daily  . aspirin EC  81 mg Oral Daily  . atorvastatin  80 mg Oral Daily  . Chlorhexidine Gluconate Cloth  6 each Topical Daily  . clopidogrel  75 mg Oral Daily  . ezetimibe  10 mg Oral Daily  . gabapentin  400 mg Oral QID  . insulin aspart  0-15 Units Subcutaneous Q4H  . levothyroxine  75 mcg Oral Daily  . losartan  25 mg Oral Daily  . metoprolol tartrate  50 mg Oral BID  . oxyCODONE  30 mg Oral Q12H  . pantoprazole  40 mg Oral Daily  . pregabalin  25 mg Oral BID  . senna-docusate  1 tablet Oral QHS   Continuous Infusions: . sodium chloride Stopped (05/05/20 1336)  . cefTRIAXone (ROCEPHIN)  IV 1 g (05/06/20 1126)  .  clevidipine Stopped (05/05/20 1112)     Radiological Exams on Admission: No results found.  Impression/Recommendations Active Problems:   Brain aneurysm   Aneurysm -Post-embolization on 12/20  -Managed by IR -Appears to be stable at this time  H/o CVA -Concern for recurrence post-procedure, but imaging was negative -She reports no deficits following prior CVA -Continue ASA, Plavix  Deconditioning -It is not entirely clear that the patient will need CIR - she reports that she had been in the bed for days when PT performed the evaluation and since then she is becoming increasingly mobile -Would encourage repeat PT/OT evaluation -Ambulation in  halls TID -Has significant social support so even those she lives alone she can stay with family or they can stay with her while she continues to work on her strength -If CIR is still recommended, she is likely to have to remain in the hospital until after the holiday for authorization and transfer early next week -In that case, she may improve enough prior to transfer -She also does not want to go to CIR and this may encourage her to mobilize and will motivate her to go home  CKD with solitary kidney -Stage 3 CKD (between 3a and 3b), slightly better than baseline currently -Continue Cozaar  HTN -Continue Norvasc, Cozaar, Lopressor  HLD -Continue high-dose Lipitor, Zetia -LDL <70 on 12/21 testing  Chronic pain -Continue Baclofen, Buprenorphine, Lyrica, Xtampza -Also on Percocet for breakthrough pain -I have reviewed this patient in the [place] Controlled Substances Reporting System. She is receiving medications from two providers but appears to be taking them as prescribed. -She is at extremely high risk of opioid misuse, diversion, or overdose. -Serious consideration of these medications is recommended as an outpatient and based on gait instability and fall risk currently would seriously consider tapering to some extent as  inpatient.  DM -A1c 4.7 on 12/20 - not c/w DM despite glucose elevation recently -Hold Trulicity, consider d/c for now -[employer] as inpatient  Chronic diastolic CHF -06/20/40 echo with preserved EF and grade 2 diastolic CHF -Continue prn Lasix -Appears compensated at this time  Iron deficiency anemia -Receives periodic Fereheme and PRBC transfusions for chronic blood loss anemia associated with GI AVM -Followed by Dr. Burr Medico  COPD -Continue prn Combivent  Hypothyroidism -Normal TSH in July -Continue Synthroid at current dose for now    Thank you for this consultation.  Our [facility] hospitalist team will sign off at this time.   Time Spent: 20 minutes  Karmen Bongo M.D. [facility] 05/07/2020, 9:35 AM

## 2020-05-08 DIAGNOSIS — I63412 Cerebral infarction due to embolism of left middle cerebral artery: Secondary | ICD-10-CM

## 2020-05-08 LAB — TYPE AND SCREEN
ABO/RH(D): B POS
Antibody Screen: NEGATIVE
Unit division: 0
Unit division: 0

## 2020-05-08 LAB — BPAM RBC
Blood Product Expiration Date: 202201142359
Blood Product Expiration Date: 202201142359
Unit Type and Rh: 7300
Unit Type and Rh: 7300

## 2020-05-08 LAB — CBC WITH DIFFERENTIAL/PLATELET
Abs Immature Granulocytes: 0.08 10*3/uL — ABNORMAL HIGH (ref 0.00–0.07)
Basophils Absolute: 0 10*3/uL (ref 0.0–0.1)
Basophils Relative: 0 %
Eosinophils Absolute: 0.1 10*3/uL (ref 0.0–0.5)
Eosinophils Relative: 1 %
HCT: 29.3 % — ABNORMAL LOW (ref 36.0–46.0)
Hemoglobin: 8.5 g/dL — ABNORMAL LOW (ref 12.0–15.0)
Immature Granulocytes: 1 %
Lymphocytes Relative: 13 %
Lymphs Abs: 1.2 10*3/uL (ref 0.7–4.0)
MCH: 25.1 pg — ABNORMAL LOW (ref 26.0–34.0)
MCHC: 29 g/dL — ABNORMAL LOW (ref 30.0–36.0)
MCV: 86.7 fL (ref 80.0–100.0)
Monocytes Absolute: 0.6 10*3/uL (ref 0.1–1.0)
Monocytes Relative: 7 %
Neutro Abs: 7.4 10*3/uL (ref 1.7–7.7)
Neutrophils Relative %: 78 %
Platelets: 246 10*3/uL (ref 150–400)
RBC: 3.38 MIL/uL — ABNORMAL LOW (ref 3.87–5.11)
RDW: 17.2 % — ABNORMAL HIGH (ref 11.5–15.5)
WBC: 9.4 10*3/uL (ref 4.0–10.5)
nRBC: 0.2 % (ref 0.0–0.2)

## 2020-05-08 LAB — BASIC METABOLIC PANEL
Anion gap: 7 (ref 5–15)
BUN: 15 mg/dL (ref 8–23)
CO2: 24 mmol/L (ref 22–32)
Calcium: 9.9 mg/dL (ref 8.9–10.3)
Chloride: 108 mmol/L (ref 98–111)
Creatinine, Ser: 1.08 mg/dL — ABNORMAL HIGH (ref 0.44–1.00)
GFR, Estimated: 55 mL/min — ABNORMAL LOW (ref >60)
Glucose, Bld: 219 mg/dL — ABNORMAL HIGH (ref 70–99)
Potassium: 4.4 mmol/L (ref 3.5–5.1)
Sodium: 139 mmol/L (ref 135–145)

## 2020-05-08 LAB — GLUCOSE, CAPILLARY
Glucose-Capillary: 236 mg/dL — ABNORMAL HIGH (ref 70–99)
Glucose-Capillary: 170 mg/dL — ABNORMAL HIGH (ref 70–99)
Glucose-Capillary: 185 mg/dL — ABNORMAL HIGH (ref 70–99)
Glucose-Capillary: 246 mg/dL — ABNORMAL HIGH (ref 70–99)
Glucose-Capillary: 254 mg/dL — ABNORMAL HIGH (ref 70–99)
Glucose-Capillary: 262 mg/dL — ABNORMAL HIGH (ref 70–99)

## 2020-05-08 MED ORDER — MAGNESIUM CITRATE PO SOLN
1.0000 | Freq: Once | ORAL | Status: AC
  Administered 2020-05-08: 1 via ORAL
  Filled 2020-05-08: qty 296

## 2020-05-08 MED ORDER — GABAPENTIN 100 MG PO CAPS
200.0000 mg | ORAL_CAPSULE | Freq: Four times a day (QID) | ORAL | Status: DC
  Administered 2020-05-08 – 2020-05-11 (×13): 200 mg via ORAL
  Filled 2020-05-08 (×13): qty 2

## 2020-05-08 NOTE — Progress Notes (Signed)
Inpatient Rehabilitation-Admissions Coordinator   Pt's insurance office is closed for the holiday. Will not have a determination until Monday. Will follow up then for possible admit, pending insurance approval.   Raechel Ache, OTR/L  Rehab Admissions Coordinator  [phone] 05/08/2020 10:32 AM

## 2020-05-08 NOTE — Progress Notes (Signed)
Physical Therapy Treatment Patient Details Name: Bethany [name] MRN: [id number] DOB: 15-Feb-1951 Today's Date: 05/08/2020    History of Present Illness 69 yo female with L MCA aneurysm s/p endovascular embolization using WEB device via right femoral approach 05/04/2020. post-operatively, pt with RUE/LE weakness and aphasia, resolving since, no acute CVA appreciated on CT. 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    PT Comments    Pt tolerates treatment well, with increased ambulation tolerance. Pt continues to demonstrate slowed processing with impaired word finding. Pt with impaired R knee control, intermittent buckling and hyperextension during stance phase greatly increasing her falls risk. Pt will benefit from continued acute PT POC to improve RLE strength and mobility quality. PT continues to recommend CIR placement  .  Follow Up Recommendations  CIR     Equipment Recommendations  Rolling walker with 5" wheels    Recommendations for Other Services       Precautions / Restrictions Precautions Precautions: Fall Restrictions Weight Bearing Restrictions: No    Mobility  Bed Mobility Overal bed mobility: Needs Assistance Bed Mobility: Supine to Sit     Supine to sit: Supervision        Transfers Overall transfer level: Needs assistance Equipment used: Rolling walker (2 wheeled) Transfers: Sit to/from Stand Sit to Stand: Min guard            Ambulation/Gait Ambulation/Gait assistance: Herbalist (Feet): 60 Feet Assistive device: Rolling walker (2 wheeled) Gait Pattern/deviations: Step-to pattern;Decreased dorsiflexion - right Gait velocity: reduced Gait velocity interpretation: <1.8 ft/sec, indicate of risk for recurrent falls General Gait Details: pt with intermittent R knee buckling vs hyperextension due to poor quad control, also poor foot clearance with fatigue of R foot   Stairs              Wheelchair Mobility    Modified Rankin (Stroke Patients Only) Modified Rankin (Stroke Patients Only) Pre-Morbid Rankin Score: No significant disability Modified Rankin: Moderately severe disability     Balance Overall balance assessment: Needs assistance Sitting-balance support: No upper extremity supported Sitting balance-Leahy Scale: Good     Standing balance support: Single extremity supported;Bilateral upper extremity supported Standing balance-Leahy Scale: Poor Standing balance comment: reliant on UE support of RW                            Cognition Arousal/Alertness: Awake/alert Behavior During Therapy: WFL for tasks assessed/performed Overall Cognitive Status: Impaired/Different from baseline Area of Impairment: Problem solving;Awareness;Safety/judgement                         Safety/Judgement: Decreased awareness of deficits Awareness: Emergent Problem Solving: Slow processing        Exercises      General Comments General comments (skin integrity, edema, etc.): VSS on RA      Pertinent Vitals/Pain Pain Assessment: Faces Faces Pain Scale: Hurts a little bit Pain Location: neck Pain Descriptors / Indicators: Grimacing Pain Intervention(s): Monitored during session    Home Living                      Prior Function            PT Goals (current goals can now be found in the care plan section) Acute Rehab PT Goals Patient Stated Goal: go home Progress towards PT goals: Progressing toward goals  Frequency    Min 4X/week      PT Plan Current plan remains appropriate    Co-evaluation              AM-PAC PT "6 Clicks" Mobility   Outcome Measure  Help needed turning from your back to your side while in a flat bed without using bedrails?: A Little Help needed moving from lying on your back to sitting on the side of a flat bed without using bedrails?: A Little Help needed moving to and from a bed to  a chair (including a wheelchair)?: A Little Help needed standing up from a chair using your arms (e.g., wheelchair or bedside chair)?: A Little Help needed to walk in hospital room?: A Little Help needed climbing 3-5 steps with a railing? : A Lot 6 Click Score: 17    End of Session Equipment Utilized During Treatment: Gait belt Activity Tolerance: Patient tolerated treatment well Patient left: in chair;with call bell/phone within reach;with chair alarm set;with family/visitor present Nurse Communication: Mobility status PT Visit Diagnosis: Other abnormalities of gait and mobility (R26.89);Muscle weakness (generalized) (M62.81)     Time: [id number] PT Time Calculation (min) (ACUTE ONLY): 29 min  Charges:  $Gait Training: 8-22 mins $Therapeutic Activity: 8-22 mins                     Zenaida Niece, PT, DPT Acute Rehabilitation Pager: [phone]    Zenaida Niece 05/08/2020, 12:49 PM

## 2020-05-08 NOTE — Progress Notes (Signed)
Referring Physician(s): Antony Contras (neurology)  Supervising Physician: Luanne Bras  Patient Status:  [facility] - In-pt  Chief Complaint:  F/U Aneurysm embo  Brief History:  Bethany [name] is a 69 year old female with known prior history of left MCA infarct with mild residual aphasia and right-sided weakness.  She underwent Lt common carotid arteriogram followed by embolization of L tMCA aneurysm with a 4.69m x 444mWEB flow disrupter device by Dr. DeEstanislado Pandyn 05/04/20.  She had sudden onset of altered mental status after the procedure. Dr. SeLeonie Manas consulted. His note reads = encephalopathy etiology indeterminate likely small multiple emboli in the left MCA distribution post post procedure.  She is currently awaiting inpatient rehab placement for mobility prior to discharge home.  Subjective:  Continue to c/o neck pain. Also c/o constipation and her hospital gown is too hot and the fabric "doesn't breathe".  She also continues to worry about the right groin, although it looks fine and exam is benign.  She is also sad that tomorrow is Christmas and will be in the hospital instead of home with family. I told her I would bring her a little gift to cheer her up.   Allergies: Contrast media [iodinated diagnostic agents]  Medications: Prior to Admission medications   Medication Sig Start Date End Date Taking? Authorizing Provider  allopurinol (ZYLOPRIM) 100 MG tablet Take 100 mg by mouth at bedtime.   Yes [provider]  amLODipine (NORVASC) 10 MG tablet Take 10 mg by mouth daily.   Yes [provider]  APPLE CIDER VINEGAR PO Take 1 tablet by mouth daily.   Yes [provider]  aspirin EC 81 MG EC tablet Take 1 tablet (81 mg total) by mouth daily. Swallow whole. 11/22/19  Yes Danford, ChSuann LarryMD  atorvastatin (LIPITOR) 80 MG tablet Take 1 tablet (80 mg total) by mouth daily. 11/22/19  Yes Danford, ChSuann LarryMD  baclofen (LIORESAL) 10 MG  tablet Take 10 mg by mouth 3 (three) times daily as needed for muscle spasms.   Yes [provider]  buprenorphine (BUTRANS) 20 MCG/HR PTWK Place 1 patch onto the skin every Monday.  11/15/19  Yes [provider]  clopidogrel (PLAVIX) 75 MG tablet Take 75 mg by mouth daily.   Yes [provider]  Dulaglutide (TRULICITY) 1.5 MGMB/8.6LJOPN Inject 1.5 mg into the skin every Monday.    Yes [provider]  ezetimibe (ZETIA) 10 MG tablet Take 10 mg by mouth daily.   Yes [provider]  gabapentin (NEURONTIN) 600 MG tablet Take 600 mg by mouth 4 (four) times daily.    Yes [provider]  insulin glargine, 1 Unit Dial, (TOUJEO SOLOSTAR) 300 UNIT/ML Solostar Pen Inject 20 Units into the skin at bedtime.    Yes [provider]  Ipratropium-Albuterol (COMBIVENT) 20-100 MCG/ACT AERS respimat Inhale 1 puff into the lungs every 6 (six) hours as needed for wheezing or shortness of breath. 03/17/17  Yes Hongalgi, AnLenis DickinsonMD  levothyroxine (SYNTHROID) 75 MCG tablet Take 75 mcg by mouth daily. 02/28/19  Yes [provider]  losartan (COZAAR) 25 MG tablet Take 1 tablet (25 mg total) by mouth daily. 11/22/19 11/21/20 Yes Danford, ChSuann LarryMD  metoprolol (LOPRESSOR) 50 MG tablet Take 50 mg by mouth 2 (two) times daily.  03/06/14  Yes [provider]  Multiple Vitamin (MULTIVITAMIN WITH MINERALS) TABS tablet Take 1 tablet by mouth daily.   Yes [provider]  oxyCODONE-acetaminophen (PERCOCET) 10-325 MG tablet Take 1 tablet by mouth every 6 (six) hours as needed (breakthrough pain).    Yes [provider]  pregabalin (LYRICA) 25 MG capsule Take 25 mg by mouth 2 (two) times daily.  01/24/19  Yes [provider]  sennosides-docusate sodium (SENOKOT-S) 8.6-50 MG tablet Take 1 tablet by mouth at bedtime.   Yes [provider]  Vitamin D, Ergocalciferol, (DRISDOL) 1.25 MG (50000 UNIT) CAPS capsule Take 50,000  Units by mouth every Monday.    Yes [provider]  XTAMPZA ER 27 MG C12A Take 27 mg by mouth 2 (two) times daily.  02/27/19  Yes [provider]  feeding supplement, ENSURE ENLIVE, (ENSURE ENLIVE) LIQD Take 237 mLs by mouth 2 (two) times daily between meals. 03/30/19   Bonnell Public, MD  furosemide (LASIX) 40 MG tablet Take 1 tablet (40 mg total) by mouth daily as needed. Patient taking differently: Take 40 mg by mouth daily as needed for fluid. 05/20/19 05/19/20  O'NealCassie Freer, MD  lidocaine-prilocaine (EMLA) cream Apply 1 application topically as needed. Patient taking differently: Apply 1 application topically daily as needed (port access). 07/03/19   Truitt Merle, MD  [facility] 4 MG/0.1ML LIQD nasal spray kit Place 1 spray into the nose as needed for opioid reversal. 08/22/19   [provider]  pantoprazole (PROTONIX) 40 MG tablet Take 40 mg by mouth every morning.    [provider]     Vital Signs: BP 138/61 (BP Location: Left Arm)   Pulse (!) 57   Temp 98.2 F (36.8 C) (Oral)   Resp 20   Ht '5\' 5"'  (1.651 m)   Wt 81.8 kg   SpO2 100%   BMI 30.01 kg/m   Physical Exam Vitals reviewed.  Constitutional:      Appearance: Normal appearance.  Cardiovascular:     Rate and Rhythm: Normal rate.  Pulmonary:     Effort: Pulmonary effort is normal. No respiratory distress.  Abdominal:     Palpations: Abdomen is soft.  Musculoskeletal:     Comments: Right CFA access site looks good.   Neurological:     General: No focal deficit present.     Mental Status: She is alert and oriented to person, place, and time.  Psychiatric:        Mood and Affect: Mood normal.        Behavior: Behavior normal.        Thought Content: Thought content normal.        Judgment: Judgment normal.     Imaging: MR BRAIN WO CONTRAST  Result Date: 05/05/2020 CLINICAL DATA:  Initial evaluation for speech abnormality following recent aneurysm repair. EXAM: MRI HEAD  WITHOUT CONTRAST TECHNIQUE: Multiplanar, multiecho pulse sequences of the brain and surrounding structures were obtained without intravenous contrast. COMPARISON:  Previous CT from 05/04/2020 and MRI from 11/19/2019. FINDINGS: Brain: Cerebral volume within normal limits for age. Mild scattered chronic microvascular ischemic changes noted involving the supratentorial cerebral white matter and pons. Involving encephalomalacia of with laminar necrosis seen at the posterior left temporal region related to the previously identified infarct. Associated susceptibility artifact related to laminar necrosis and/or chronic hemosiderin staining. No other abnormal foci of restricted diffusion to suggest acute or subacute ischemia. Gray-white matter differentiation otherwise maintained. No other evidence for acute or chronic intracranial hemorrhage. No mass lesion, midline shift or mass effect. No hydrocephalus or extra-axial fluid collection. Pituitary gland suprasellar region normal. Midline structures intact. Vascular: Major  intracranial vascular flow voids are maintained. Skull and upper cervical spine: Craniocervical junction within normal limits. Diffusely decreased T1 signal intensity seen throughout the visualized bone marrow, nonspecific, but most commonly related to anemia, smoking, or obesity. No scalp soft tissue abnormality. Sinuses/Orbits: Patient status post bilateral ocular lens replacement. Globes and orbital soft tissues demonstrate no acute finding. Paranasal sinuses are largely clear. Right mastoid effusion noted. Visualized nasopharynx within normal limits. Other: None. IMPRESSION: 1. No acute intracranial infarct or other abnormality. 2. Evolving encephalomalacia with laminar necrosis involving the posterior left temporal region related to previously identified left MCA territory infarct. 3. Underlying mild chronic microvascular ischemic disease. 4. Right mastoid effusion. Electronically Signed   By:  Jeannine Boga M.D.   On: 05/05/2020 01:36   IR Transcath/Emboliz  Result Date: 05/06/2020 CLINICAL DATA:  Patient with headaches and multiple intracranial aneurysms. Patient admitted for unruptured left middle cerebral artery aneurysm. EXAM: TRANSCATHETER THERAPY EMBOLIZATION COMPARISON:  Diagnostic catheter arteriogram of February 06, 2020. MEDICATIONS: Heparin 3,000 units IV. Ancef 2 g IV antibiotic was administered within 1 hour. ANESTHESIA/SEDATION: General anesthesia CONTRAST:  Isovue 300 approximately 120 mL FLUOROSCOPY TIME:  Fluoroscopy Time: 35 minutes 18 seconds (1457 mGy). COMPLICATIONS: None immediate. TECHNIQUE: Informed written consent was obtained from the patient after a thorough discussion of the procedural risks, benefits and alternatives. All questions were addressed. Maximal Sterile Barrier Technique was utilized including caps, mask, sterile gowns, sterile gloves, sterile drape, hand hygiene and skin antiseptic. A timeout was performed prior to the initiation of the procedure. Initially a radial access was obtained with ultrasound guidance after having documented the right radial artery morphology with ultrasound. However, due to persistent vaso spasm induced by the micro guidewire, this approach was abandoned. The right groin was prepped and draped in the usual sterile fashion. Thereafter using modified Seldinger technique, transfemoral access into the right common femoral artery was obtained without difficulty. Over a 0.035 inch guidewire, an 8 [place] 25 cm Pinnacle sheath was inserted. Through this, and also over 0.035 inch guidewire, a 5 [place] JB 1 catheter was advanced to the aortic arch region and selectively positioned in the left common carotid artery. FINDINGS:.: FINDINGS:. The left common carotid arteriogram demonstrates the left external carotid artery and its major branches to be widely patent. The left internal carotid artery at the bulb to the cranial skull  base is also widely patent. Patency is maintained into the petrous, cavernous and supraclinoid segments. Again demonstrated are 2 small saccular outpouchings projecting posteriorly from the supraclinoid left ICA, one in the region of the origin of the anterior choroidal artery. The left middle cerebral artery and the left anterior cerebral artery opacify into the capillary and venous phases. Prompt filling via the anterior communicating artery of the right anterior cerebral A2 segment is seen. Arising at the bifurcation of the left middle cerebral artery again noted is a saccular aneurysm with a relatively wide neck projecting slightly anteriorly. A 3D rotational arteriogram was then performed from the left common carotid artery extending intracranially. Reconstruction was then performed on a separate workstation. Optimal views were then obtained for treatment of the aneurysm. The aneurysm measured to be approximately 4.1 mm in its maximum width, and 3.82 mm in height. It was decided to use a WEB SL W5-4.3 for intra saccular disruption device for embolization. ENDOVASCULAR EMBOLIZATION OF LEFT MIDDLE CEREBRAL ARTERY BIFURCATION REGION ANEURYSM The diagnostic JB 1 catheter in the left common carotid artery was then exchanged over a 0.035 inch 300  cm Constance Holster exchange guidewire for a 90 cm 6 [place] Infinity sheath. This was advanced to the bifurcation of the left common carotid artery. The guidewire was removed. Good aspiration obtained from the hub of the Infinity sheath. A gentle control arteriogram performed through this demonstrated no evidence of vasospasm or of intraluminal filling defects. This was then connected to continuous heparinized saline infusion. Through the Infinity sheath, a combination of a 6 [place] 115 cm intermediary catheter inside of which was a Via 17 microcatheter was advanced over a 0.014 inch standard Synchro micro guidewire with a J configuration to the proximal cavernous segment. At  this time, the Infinity guide sheath was advanced to the junction of the cervical petrous junction. Using biplane roadmap technique and constant fluoroscopic guidance, in a coaxial manner and with constant heparinized saline infusion, the micro guidewire was then gently advanced with a J-tip configuration to the left middle cerebral artery followed by the microcatheter. The micro guidewire was then gently advanced into the aneurysm followed by the microcatheter. The guidewire was removed. Good aspiration obtained from the hub of the microcatheter. This was then connected to continuous heparinized saline infusion. The WEB SL W5-4.3 intra saccular flow disrupter was then prepped and purged in heparinized saline. It was cleared of any air bubbles. This was then recaptured into the housing catheter. This was then loaded onto the microcatheter after heparin flushed in the Tuohy Borst. Smooth advancement was obtained to the middle cerebral artery very slowly ensuring no forward motion or movements of the microcatheter which was just inside the neck of the aneurysm. The distal marker on the WEB device was advanced to the distal marker on the microcatheter. The entire system was then straightened. With the microcatheter stable, the WEB device was advanced until early formation of a Tulip configuration. A control arteriogram performed through the [facility] guide catheter demonstrated safe position and orientation of the device within the aneurysm. The microcatheter was then gently advanced further into the aneurysm followed by advancement of the WEB device into the aneurysm. Complete opening of the device was noted. A control arteriogram performed through the [facility] guide catheter demonstrated complete opening and apposition of the device into the aneurysm. The superior and inferior branches of the left middle cerebral artery were patent without evidence of encroachment on the bifurcation branches. With the WEB device well  oriented in the AP and lateral projections, the microcatheter was gently withdrawn to just proximal to the proximal marker on the WEB device. Again the orientation of the WEB device within the aneurysm remained unchanged. A control arteriogram performed through the [facility] guide catheter demonstrated excellent apposition on the AP and lateral projections with contrast stasis within the aneurysm. Under constant biplane fluoroscopy, the device was disconnected and deployed using the detachment device at the proximal aspect of the delivery wire. The micro guidewire was gently advanced just distal to the microcatheter then withdrawn. A control arteriogram was then performed through the [facility] guide catheter in the left internal carotid artery which continued to demonstrate excellent flow through the left middle cerebral artery distribution with patency of the superior and inferior divisions, and stasis within the aneurysm itself. Control arteriograms were then performed at 10 and 20 minutes post deployment of the device. These continued to demonstrated no evidence of intraluminal filling defects, or of occlusions in the left middle cerebral artery and the left anterior cerebral artery distributions. The [place] 6 [place] guide catheter was removed. The Infinity sheath was then retrieved to just  proximal to the left common carotid artery bifurcation. Control arteriogram performed through this continued to demonstrate wide patency of the of the left internal carotid artery proximally and distally with no change in the intra saccular device within left middle cerebral artery aneurysm. The Infinity sheath was removed. The 8 French Pinnacle sheath was then removed with the successful application of an 8 French Angio-Seal closure device for hemostasis. Distal pulses remained palpable in both feet unchanged. [name] ACT was in the region of approximately 250. This was partially reversed with 5 mg of protamine sulfate. The  patient's general anesthesia was slowly reversed. She was able to follow simple commands following the extubation. She was transferred to PACU for post extubation management. 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 PLAN: Patient to be left on aspirin 81 mg a day and Plavix 75 mg per day for 2 weeks. Follow-up in the clinic 2 weeks post discharge. Electronically Signed   By: Luanne Bras M.D.   On: 05/05/2020 09:52   IR Angiogram Follow Up Study  Result Date: 05/06/2020 CLINICAL DATA:  Patient with headaches and multiple intracranial aneurysms. Patient admitted for unruptured left middle cerebral artery aneurysm. EXAM: TRANSCATHETER THERAPY EMBOLIZATION COMPARISON:  Diagnostic catheter arteriogram of February 06, 2020. MEDICATIONS: Heparin 3,000 units IV. Ancef 2 g IV antibiotic was administered within 1 hour. ANESTHESIA/SEDATION: General anesthesia CONTRAST:  Isovue 300 approximately 120 mL FLUOROSCOPY TIME:  Fluoroscopy Time: 35 minutes 18 seconds (1457 mGy). COMPLICATIONS: None immediate. TECHNIQUE: Informed written consent was obtained from the patient after a thorough discussion of the procedural risks, benefits and alternatives. All questions were addressed. Maximal Sterile Barrier Technique was utilized including caps, mask, sterile gowns, sterile gloves, sterile drape, hand hygiene and skin antiseptic. A timeout was performed prior to the initiation of the procedure. Initially a radial access was obtained with ultrasound guidance after having documented the right radial artery morphology with ultrasound. However, due to persistent vaso spasm induced by the micro guidewire, this approach was abandoned. The right groin was prepped and draped in the usual sterile fashion. Thereafter using modified Seldinger technique, transfemoral access into the right common femoral artery was obtained  without difficulty. Over a 0.035 inch guidewire, an 8 [place] 25 cm Pinnacle sheath was inserted. Through this, and also over 0.035 inch guidewire, a 5 [place] JB 1 catheter was advanced to the aortic arch region and selectively positioned in the left common carotid artery. FINDINGS:.: FINDINGS:. The left common carotid arteriogram demonstrates the left external carotid artery and its major branches to be widely patent. The left internal carotid artery at the bulb to the cranial skull base is also widely patent. Patency is maintained into the petrous, cavernous and supraclinoid segments. Again demonstrated are 2 small saccular outpouchings projecting posteriorly from the supraclinoid left ICA, one in the region of the origin of the anterior choroidal artery. The left middle cerebral artery and the left anterior cerebral artery opacify into the capillary and venous phases. Prompt filling via the anterior communicating artery of the right anterior cerebral A2 segment is seen. Arising at the bifurcation of the left middle cerebral artery again noted is a saccular aneurysm with a relatively wide neck projecting slightly anteriorly. A 3D rotational arteriogram was then performed from the left common carotid artery extending intracranially. Reconstruction was then performed on a separate workstation. Optimal views were then obtained for treatment of the aneurysm. The aneurysm  measured to be approximately 4.1 mm in its maximum width, and 3.82 mm in height. It was decided to use a WEB SL W5-4.3 for intra saccular disruption device for embolization. ENDOVASCULAR EMBOLIZATION OF LEFT MIDDLE CEREBRAL ARTERY BIFURCATION REGION ANEURYSM The diagnostic JB 1 catheter in the left common carotid artery was then exchanged over a 0.035 inch 300 cm [place] exchange guidewire for a 90 cm 6 [place] Infinity sheath. This was advanced to the bifurcation of the left common carotid artery. The guidewire was removed. Good aspiration  obtained from the hub of the Infinity sheath. A gentle control arteriogram performed through this demonstrated no evidence of vasospasm or of intraluminal filling defects. This was then connected to continuous heparinized saline infusion. Through the Infinity sheath, a combination of a 6 [place] 115 cm intermediary catheter inside of which was a Via 17 microcatheter was advanced over a 0.014 inch standard Synchro micro guidewire with a J configuration to the proximal cavernous segment. At this time, the Infinity guide sheath was advanced to the junction of the cervical petrous junction. Using biplane roadmap technique and constant fluoroscopic guidance, in a coaxial manner and with constant heparinized saline infusion, the micro guidewire was then gently advanced with a J-tip configuration to the left middle cerebral artery followed by the microcatheter. The micro guidewire was then gently advanced into the aneurysm followed by the microcatheter. The guidewire was removed. Good aspiration obtained from the hub of the microcatheter. This was then connected to continuous heparinized saline infusion. The WEB SL W5-4.3 intra saccular flow disrupter was then prepped and purged in heparinized saline. It was cleared of any air bubbles. This was then recaptured into the housing catheter. This was then loaded onto the microcatheter after heparin flushed in the Tuohy Borst. Smooth advancement was obtained to the middle cerebral artery very slowly ensuring no forward motion or movements of the microcatheter which was just inside the neck of the aneurysm. The distal marker on the WEB device was advanced to the distal marker on the microcatheter. The entire system was then straightened. With the microcatheter stable, the WEB device was advanced until early formation of a Tulip configuration. A control arteriogram performed through the [facility] guide catheter demonstrated safe position and orientation of the device within the  aneurysm. The microcatheter was then gently advanced further into the aneurysm followed by advancement of the WEB device into the aneurysm. Complete opening of the device was noted. A control arteriogram performed through the [facility] guide catheter demonstrated complete opening and apposition of the device into the aneurysm. The superior and inferior branches of the left middle cerebral artery were patent without evidence of encroachment on the bifurcation branches. With the WEB device well oriented in the AP and lateral projections, the microcatheter was gently withdrawn to just proximal to the proximal marker on the WEB device. Again the orientation of the WEB device within the aneurysm remained unchanged. A control arteriogram performed through the [facility] guide catheter demonstrated excellent apposition on the AP and lateral projections with contrast stasis within the aneurysm. Under constant biplane fluoroscopy, the device was disconnected and deployed using the detachment device at the proximal aspect of the delivery wire. The micro guidewire was gently advanced just distal to the microcatheter then withdrawn. A control arteriogram was then performed through the [facility] guide catheter in the left internal carotid artery which continued to demonstrate excellent flow through the left middle cerebral artery distribution with patency of the superior and inferior divisions, and stasis within  the aneurysm itself. Control arteriograms were then performed at 10 and 20 minutes post deployment of the device. These continued to demonstrated no evidence of intraluminal filling defects, or of occlusions in the left middle cerebral artery and the left anterior cerebral artery distributions. The [place] 6 [place] guide catheter was removed. The Infinity sheath was then retrieved to just proximal to the left common carotid artery bifurcation. Control arteriogram performed through this continued to demonstrate wide patency of the of  the left internal carotid artery proximally and distally with no change in the intra saccular device within left middle cerebral artery aneurysm. The Infinity sheath was removed. The 8 French Pinnacle sheath was then removed with the successful application of an 8 [place] Angio-Seal closure device for hemostasis. Distal pulses remained palpable in both feet unchanged. [name] ACT was in the region of approximately 250. This was partially reversed with 5 mg of protamine sulfate. [name] general anesthesia was slowly reversed. She was able to follow simple commands following the extubation. She was transferred to PACU for post extubation management. 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 PLAN: Patient to be left on aspirin 81 mg a day and Plavix 75 mg per day for 2 weeks. Follow-up in the clinic 2 weeks post discharge. Electronically Signed   By: Luanne Bras M.D.   On: 05/05/2020 09:52   IR 3D Independent Darreld Mclean  Result Date: 05/06/2020 CLINICAL DATA:  Patient with headaches and multiple intracranial aneurysms. Patient admitted for unruptured left middle cerebral artery aneurysm. EXAM: TRANSCATHETER THERAPY EMBOLIZATION COMPARISON:  Diagnostic catheter arteriogram of February 06, 2020. MEDICATIONS: Heparin 3,000 units IV. Ancef 2 g IV antibiotic was administered within 1 hour. ANESTHESIA/SEDATION: General anesthesia CONTRAST:  Isovue 300 approximately 120 mL FLUOROSCOPY TIME:  Fluoroscopy Time: 35 minutes 18 seconds (1457 mGy). COMPLICATIONS: None immediate. TECHNIQUE: Informed written consent was obtained from the patient after a thorough discussion of the procedural risks, benefits and alternatives. All questions were addressed. Maximal Sterile Barrier Technique was utilized including caps, mask, sterile gowns, sterile gloves, sterile drape, hand hygiene and skin antiseptic. A  timeout was performed prior to the initiation of the procedure. Initially a radial access was obtained with ultrasound guidance after having documented the right radial artery morphology with ultrasound. However, due to persistent vaso spasm induced by the micro guidewire, this approach was abandoned. The right groin was prepped and draped in the usual sterile fashion. Thereafter using modified Seldinger technique, transfemoral access into the right common femoral artery was obtained without difficulty. Over a 0.035 inch guidewire, an 8 [place] 25 cm Pinnacle sheath was inserted. Through this, and also over 0.035 inch guidewire, a 5 [place] JB 1 catheter was advanced to the aortic arch region and selectively positioned in the left common carotid artery. FINDINGS:.: FINDINGS:. The left common carotid arteriogram demonstrates the left external carotid artery and its major branches to be widely patent. The left internal carotid artery at the bulb to the cranial skull base is also widely patent. Patency is maintained into the petrous, cavernous and supraclinoid segments. Again demonstrated are 2 small saccular outpouchings projecting posteriorly from the supraclinoid left ICA, one in the region of the origin of the anterior choroidal artery. The left middle cerebral artery and the left anterior cerebral artery opacify into the capillary and venous phases. Prompt filling via the anterior communicating artery of the right anterior cerebral A2 segment is seen.  Arising at the bifurcation of the left middle cerebral artery again noted is a saccular aneurysm with a relatively wide neck projecting slightly anteriorly. A 3D rotational arteriogram was then performed from the left common carotid artery extending intracranially. Reconstruction was then performed on a separate workstation. Optimal views were then obtained for treatment of the aneurysm. The aneurysm measured to be approximately 4.1 mm in its maximum width, and  3.82 mm in height. It was decided to use a WEB SL W5-4.3 for intra saccular disruption device for embolization. ENDOVASCULAR EMBOLIZATION OF LEFT MIDDLE CEREBRAL ARTERY BIFURCATION REGION ANEURYSM The diagnostic JB 1 catheter in the left common carotid artery was then exchanged over a 0.035 inch 300 cm Rosen exchange guidewire for a 90 cm 6 [place] Infinity sheath. This was advanced to the bifurcation of the left common carotid artery. The guidewire was removed. Good aspiration obtained from the hub of the Infinity sheath. A gentle control arteriogram performed through this demonstrated no evidence of vasospasm or of intraluminal filling defects. This was then connected to continuous heparinized saline infusion. Through the Infinity sheath, a combination of a 6 [place] 115 cm intermediary catheter inside of which was a Via 17 microcatheter was advanced over a 0.014 inch standard Synchro micro guidewire with a J configuration to the proximal cavernous segment. At this time, the Infinity guide sheath was advanced to the junction of the cervical petrous junction. Using biplane roadmap technique and constant fluoroscopic guidance, in a coaxial manner and with constant heparinized saline infusion, the micro guidewire was then gently advanced with a J-tip configuration to the left middle cerebral artery followed by the microcatheter. The micro guidewire was then gently advanced into the aneurysm followed by the microcatheter. The guidewire was removed. Good aspiration obtained from the hub of the microcatheter. This was then connected to continuous heparinized saline infusion. The WEB SL W5-4.3 intra saccular flow disrupter was then prepped and purged in heparinized saline. It was cleared of any air bubbles. This was then recaptured into the housing catheter. This was then loaded onto the microcatheter after heparin flushed in the Tuohy Borst. Smooth advancement was obtained to the middle cerebral artery very slowly  ensuring no forward motion or movements of the microcatheter which was just inside the neck of the aneurysm. The distal marker on the WEB device was advanced to the distal marker on the microcatheter. The entire system was then straightened. With the microcatheter stable, the WEB device was advanced until early formation of a Tulip configuration. A control arteriogram performed through the [facility] guide catheter demonstrated safe position and orientation of the device within the aneurysm. The microcatheter was then gently advanced further into the aneurysm followed by advancement of the WEB device into the aneurysm. Complete opening of the device was noted. A control arteriogram performed through the [facility] guide catheter demonstrated complete opening and apposition of the device into the aneurysm. The superior and inferior branches of the left middle cerebral artery were patent without evidence of encroachment on the bifurcation branches. With the WEB device well oriented in the AP and lateral projections, the microcatheter was gently withdrawn to just proximal to the proximal marker on the WEB device. Again the orientation of the WEB device within the aneurysm remained unchanged. A control arteriogram performed through the [facility] guide catheter demonstrated excellent apposition on the AP and lateral projections with contrast stasis within the aneurysm. Under constant biplane fluoroscopy, the device was disconnected and deployed using the detachment device at the proximal  aspect of the delivery wire. The micro guidewire was gently advanced just distal to the microcatheter then withdrawn. A control arteriogram was then performed through the [facility] guide catheter in the left internal carotid artery which continued to demonstrate excellent flow through the left middle cerebral artery distribution with patency of the superior and inferior divisions, and stasis within the aneurysm itself. Control arteriograms were then  performed at 10 and 20 minutes post deployment of the device. These continued to demonstrated no evidence of intraluminal filling defects, or of occlusions in the left middle cerebral artery and the left anterior cerebral artery distributions. The [place] 6 [place] guide catheter was removed. The Infinity sheath was then retrieved to just proximal to the left common carotid artery bifurcation. Control arteriogram performed through this continued to demonstrate wide patency of the of the left internal carotid artery proximally and distally with no change in the intra saccular device within left middle cerebral artery aneurysm. The Infinity sheath was removed. The 8 French Pinnacle sheath was then removed with the successful application of an 8 French Angio-Seal closure device for hemostasis. Distal pulses remained palpable in both feet unchanged. [name] ACT was in the region of approximately 250. This was partially reversed with 5 mg of protamine sulfate. [name] general anesthesia was slowly reversed. She was able to follow simple commands following the extubation. She was transferred to PACU for post extubation management. 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 PLAN: Patient to be left on aspirin 81 mg a day and Plavix 75 mg per day for 2 weeks. Follow-up in the clinic 2 weeks post discharge. Electronically Signed   By: Luanne Bras M.D.   On: 05/05/2020 09:52   IR US Guide Vasc Access Right  Result Date: 05/06/2020 CLINICAL DATA:  Patient with headaches and multiple intracranial aneurysms. Patient admitted for unruptured left middle cerebral artery aneurysm. EXAM: TRANSCATHETER THERAPY EMBOLIZATION COMPARISON:  Diagnostic catheter arteriogram of February 06, 2020. MEDICATIONS: Heparin 3,000 units IV. Ancef 2 g IV antibiotic was administered within 1 hour. ANESTHESIA/SEDATION:  General anesthesia CONTRAST:  Isovue 300 approximately 120 mL FLUOROSCOPY TIME:  Fluoroscopy Time: 35 minutes 18 seconds (1457 mGy). COMPLICATIONS: None immediate. TECHNIQUE: Informed written consent was obtained from the patient after a thorough discussion of the procedural risks, benefits and alternatives. All questions were addressed. Maximal Sterile Barrier Technique was utilized including caps, mask, sterile gowns, sterile gloves, sterile drape, hand hygiene and skin antiseptic. A timeout was performed prior to the initiation of the procedure. Initially a radial access was obtained with ultrasound guidance after having documented the right radial artery morphology with ultrasound. However, due to persistent vaso spasm induced by the micro guidewire, this approach was abandoned. The right groin was prepped and draped in the usual sterile fashion. Thereafter using modified Seldinger technique, transfemoral access into the right common femoral artery was obtained without difficulty. Over a 0.035 inch guidewire, an 8 [place] 25 cm Pinnacle sheath was inserted. Through this, and also over 0.035 inch guidewire, a 5 [place] JB 1 catheter was advanced to the aortic arch region and selectively positioned in the left common carotid artery. FINDINGS:.: FINDINGS:. The left common carotid arteriogram demonstrates the left external carotid artery and its major branches to be widely patent. The left internal carotid artery at the bulb to the cranial skull base is also widely patent. Patency is maintained into the petrous, cavernous and supraclinoid  segments. Again demonstrated are 2 small saccular outpouchings projecting posteriorly from the supraclinoid left ICA, one in the region of the origin of the anterior choroidal artery. The left middle cerebral artery and the left anterior cerebral artery opacify into the capillary and venous phases. Prompt filling via the anterior communicating artery of the right anterior  cerebral A2 segment is seen. Arising at the bifurcation of the left middle cerebral artery again noted is a saccular aneurysm with a relatively wide neck projecting slightly anteriorly. A 3D rotational arteriogram was then performed from the left common carotid artery extending intracranially. Reconstruction was then performed on a separate workstation. Optimal views were then obtained for treatment of the aneurysm. The aneurysm measured to be approximately 4.1 mm in its maximum width, and 3.82 mm in height. It was decided to use a WEB SL W5-4.3 for intra saccular disruption device for embolization. ENDOVASCULAR EMBOLIZATION OF LEFT MIDDLE CEREBRAL ARTERY BIFURCATION REGION ANEURYSM The diagnostic JB 1 catheter in the left common carotid artery was then exchanged over a 0.035 inch 300 cm Rosen exchange guidewire for a 90 cm 6 [place] Infinity sheath. This was advanced to the bifurcation of the left common carotid artery. The guidewire was removed. Good aspiration obtained from the hub of the Infinity sheath. A gentle control arteriogram performed through this demonstrated no evidence of vasospasm or of intraluminal filling defects. This was then connected to continuous heparinized saline infusion. Through the Infinity sheath, a combination of a 6 [place] 115 cm intermediary catheter inside of which was a Via 17 microcatheter was advanced over a 0.014 inch standard Synchro micro guidewire with a J configuration to the proximal cavernous segment. At this time, the Infinity guide sheath was advanced to the junction of the cervical petrous junction. Using biplane roadmap technique and constant fluoroscopic guidance, in a coaxial manner and with constant heparinized saline infusion, the micro guidewire was then gently advanced with a J-tip configuration to the left middle cerebral artery followed by the microcatheter. The micro guidewire was then gently advanced into the aneurysm followed by the microcatheter. The  guidewire was removed. Good aspiration obtained from the hub of the microcatheter. This was then connected to continuous heparinized saline infusion. The WEB SL W5-4.3 intra saccular flow disrupter was then prepped and purged in heparinized saline. It was cleared of any air bubbles. This was then recaptured into the housing catheter. This was then loaded onto the microcatheter after heparin flushed in the Tuohy Borst. Smooth advancement was obtained to the middle cerebral artery very slowly ensuring no forward motion or movements of the microcatheter which was just inside the neck of the aneurysm. The distal marker on the WEB device was advanced to the distal marker on the microcatheter. The entire system was then straightened. With the microcatheter stable, the WEB device was advanced until early formation of a Tulip configuration. A control arteriogram performed through the [facility] guide catheter demonstrated safe position and orientation of the device within the aneurysm. The microcatheter was then gently advanced further into the aneurysm followed by advancement of the WEB device into the aneurysm. Complete opening of the device was noted. A control arteriogram performed through the [facility] guide catheter demonstrated complete opening and apposition of the device into the aneurysm. The superior and inferior branches of the left middle cerebral artery were patent without evidence of encroachment on the bifurcation branches. With the WEB device well oriented in the AP and lateral projections, the microcatheter was gently withdrawn to just proximal  to the proximal marker on the WEB device. Again the orientation of the WEB device within the aneurysm remained unchanged. A control arteriogram performed through the [facility] guide catheter demonstrated excellent apposition on the AP and lateral projections with contrast stasis within the aneurysm. Under constant biplane fluoroscopy, the device was disconnected and deployed  using the detachment device at the proximal aspect of the delivery wire. The micro guidewire was gently advanced just distal to the microcatheter then withdrawn. A control arteriogram was then performed through the [facility] guide catheter in the left internal carotid artery which continued to demonstrate excellent flow through the left middle cerebral artery distribution with patency of the superior and inferior divisions, and stasis within the aneurysm itself. Control arteriograms were then performed at 10 and 20 minutes post deployment of the device. These continued to demonstrated no evidence of intraluminal filling defects, or of occlusions in the left middle cerebral artery and the left anterior cerebral artery distributions. The [place] 6 [place] guide catheter was removed. The Infinity sheath was then retrieved to just proximal to the left common carotid artery bifurcation. Control arteriogram performed through this continued to demonstrate wide patency of the of the left internal carotid artery proximally and distally with no change in the intra saccular device within left middle cerebral artery aneurysm. The Infinity sheath was removed. The 8 French Pinnacle sheath was then removed with the successful application of an 8 French Angio-Seal closure device for hemostasis. Distal pulses remained palpable in both feet unchanged. [name] ACT was in the region of approximately 250. This was partially reversed with 5 mg of protamine sulfate. [name] general anesthesia was slowly reversed. She was able to follow simple commands following the extubation. She was transferred to PACU for post extubation management. 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 PLAN: Patient to be left on aspirin 81 mg a day and Plavix 75 mg per day for 2 weeks. Follow-up in the clinic 2 weeks post discharge. Electronically  Signed   By: Luanne Bras M.D.   On: 05/05/2020 09:52   EEG adult  Result Date: 05/05/2020 Lora Havens, MD     05/05/2020  9:26 AM Patient Name: Bethany [name] MRN: [id number] Epilepsy Attending: Lora Havens Referring Physician/Provider: Dr Antony Contras Date: 05/04/2020 Duration: 25.07 mins Patient history: 69 year old lady with known prior history of left MCA infarct with mild residual aphasia and right-sided weakness with sudden onset of altered mental status following elective left middle cerebral artery bifurcation aneurysm successful coiling with exam consistent with aphasia and mild right-sided weakness and encephalopathy. EEG to evaluate for seizure Level of alertness: Awake AEDs during EEG study: GBP, PGB Technical aspects: This EEG study was done with scalp electrodes positioned according to the 10-20 International system of electrode placement. Electrical activity was acquired at a sampling rate of '500Hz'  and reviewed with a high frequency filter of '70Hz'  and a low frequency filter of '1Hz' . EEG data were recorded continuously and digitally stored. Description: The posterior dominant rhythm consists of 8 Hz activity of moderate voltage (25-35 uV) seen predominantly in posterior head regions, symmetric and reactive to eye opening and eye closing. EEG showed continuous left temporal 3 to 6 Hz theta-delta slowing as well as intermittent generalized 3 to 6 Hz theta-delta slowing. Hyperventilation and photic stimulation were not performed.   ABNORMALITY -Continuous slow, left temporal IMPRESSION: This study is suggestive of cortical dysfunction  in left temporal region secondary to underlying structural abnormality/stroke. Additionally there is mild diffuse encephalopathy, nonspecific etiology. No seizures or epileptiform discharges were seen throughout the recording. Priyanka O Yadav   VAS [place] GROIN PSEUDOANEURYSM  Result Date: 05/05/2020  ARTERIAL PSEUDOANEURYSM  Exam: Right groin  Indications: Patient complains of groin pain. History: Status post left carotid arterigram follow by emblomization left tMCA aneurysm. Comparison Study: No prior. Performing Technologist: Oda Cogan RDMS, RVT  Examination Guidelines: A complete evaluation includes B-mode imaging, spectral Doppler, color Doppler, and power Doppler as needed of all accessible portions of each vessel. Bilateral testing is considered an integral part of a complete examination. Limited examinations for reoccurring indications may be performed as noted. +------------+----------+--------+------+----------+ Right DuplexPSV (cm/s)WaveformPlaqueComment(s) +------------+----------+--------+------+----------+ CFA            116                    Patent   +------------+----------+--------+------+----------+ Prox SFA       188                    Patent   +------------+----------+--------+------+----------+ Right Vein comments:Patent right common femoral vein.  Summary: No evidence of right groin pseudoaneurysm or AVF.  Diagnosing physician: Harold Barban MD Electronically signed by Harold Barban MD on 05/05/2020 at 78:06:38 PM.   --------------------------------------------------------------------------------    Final    CT HEAD CODE STROKE WO CONTRAST  Result Date: 05/04/2020 CLINICAL DATA:  Code stroke.  Stroke follow-up. EXAM: CT HEAD WITHOUT CONTRAST TECHNIQUE: Contiguous axial images were obtained from the base of the skull through the vertex without intravenous contrast. COMPARISON:  CT head and MRI from July 6, 21. FINDINGS: Brain: Evolving encephalomalacia in the lateral left temporal lobe, in the region of the previously characterized infarct. Decreased edema and mass effect. No evidence of new/acute large vascular territory infarct. No acute hemorrhage. Vascular: No hyperdense vessel identified. Post-surgical change related to left MCA aneurysm embolization with a web flow disrupter device. Skull: No acute  fracture. Sinuses/Orbits: Mild paranasal sinus mucosal thickening without air-fluid levels. Unremarkable orbits. Other: No mastoid effusions. ASPECTS [facility] Stroke Program Early CT Score) total score (0-10 with 10 being normal): 10 IMPRESSION: 1. No evidence of acute intracranial abnormality. ASPECTS is 10. 2. Evolving encephalomalacia in the lateral left temporal lobe, in the region of the previously characterized infarct. 3. Post-surgical change related to left MCA aneurysm embolization with a web flow disrupter device. Code stroke imaging results were communicated on 05/04/2020 at 2:22 pm to provider Dr. Leonie Man via telephone, who verbally acknowledged these results. Electronically Signed   By: Margaretha Sheffield MD   On: 05/04/2020 14:28   CT ANGIO HEAD CODE STROKE  Result Date: 05/04/2020 CLINICAL DATA:  Stroke suspected. EXAM: CT ANGIOGRAPHY HEAD AND NECK TECHNIQUE: Multidetector CT imaging of the head and neck was performed using the standard protocol during bolus administration of intravenous contrast. Multiplanar CT image reconstructions and MIPs were obtained to evaluate the vascular anatomy. Carotid stenosis measurements (when applicable) are obtained utilizing NASCET criteria, using the distal internal carotid diameter as the denominator. CONTRAST:  9m OMNIPAQUE IOHEXOL 350 MG/ML SOLN COMPARISON:  MRA November 20, 2019. CT chest June 15 21. FINDINGS: CTA NECK FINDINGS Aortic arch: Great vessel origins are patent. Right carotid system: No evidence of dissection, stenosis (50% or greater) or occlusion. Atherosclerosis at the bifurcation. Left carotid system: No evidence of dissection, stenosis (50% or greater) or occlusion. Atherosclerosis at the bifurcation. Vertebral arteries: Codominant. No  evidence of dissection, stenosis (50% or greater) or occlusion. Skeleton: No acute findings. Other neck: No mass or suspicious adenopathy. Upper chest: Small bilateral pleural effusions. Patchy opacities in the lung  apices may represent aspiration and/or pneumonia. Review of the MIP images confirms the above findings CTA HEAD FINDINGS Anterior circulation: No significant proximal stenosis or large vessel occlusion. Status post treatment of a left MCA aneurysm with web flow disrupted device. There is some expected flow within the aneurysm sac. Approximately 2 mm left posterior communicating artery aneurysm (see series 7, image 109). Distal left M2/M3 MCA branch stenosis was better characterized on the prior MRA. Posterior circulation: No significant stenosis, proximal occlusion, aneurysm, or vascular malformation. Mild stenosis of the basilar artery. Venous sinuses: As permitted by contrast timing, patent. Review of the MIP images confirms the above findings IMPRESSION: 1. No large vessel occlusion or new hemodynamically significant proximal stenosis in the head or neck. 2. Status post treatment of a left MCA aneurysm with web flow disrupted device. There is some expected flow within the aneurysm sac. 3. 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 4. Small bilateral pleural effusions. Patchy opacities in the lung apices may represent aspiration and/or pneumonia. Additionally, there appears to be a new 1 cm subpleural nodular opacity in the imaged lateral left upper lobe that is new since CT chest from October 29, 2019. While this most likely represents an infectious/inflammatory process, recommend follow-chest CT when the patient is able to exclude pulmonary nodule. Critical findings discussed with Dr. Marjory Lies at 2:24 PM and Dr. Leonie Man at 2:45 PM via telephone. Lung findings discussed with Dr. Leonie Man at 3:00 PM. Electronically Signed   By: Margaretha Sheffield MD   On: 05/04/2020 15:05   CT ANGIO NECK CODE STROKE  Result Date: 05/04/2020 CLINICAL DATA:  Stroke suspected. EXAM: CT ANGIOGRAPHY HEAD AND NECK TECHNIQUE:  Multidetector CT imaging of the head and neck was performed using the standard protocol during bolus administration of intravenous contrast. Multiplanar CT image reconstructions and MIPs were obtained to evaluate the vascular anatomy. Carotid stenosis measurements (when applicable) are obtained utilizing NASCET criteria, using the distal internal carotid diameter as the denominator. CONTRAST:  37m OMNIPAQUE IOHEXOL 350 MG/ML SOLN COMPARISON:  MRA November 20, 2019. CT chest June 15 21. FINDINGS: CTA NECK FINDINGS Aortic arch: Great vessel origins are patent. Right carotid system: No evidence of dissection, stenosis (50% or greater) or occlusion. Atherosclerosis at the bifurcation. Left carotid system: No evidence of dissection, stenosis (50% or greater) or occlusion. Atherosclerosis at the bifurcation. Vertebral arteries: Codominant. No evidence of dissection, stenosis (50% or greater) or occlusion. Skeleton: No acute findings. Other neck: No mass or suspicious adenopathy. Upper chest: Small bilateral pleural effusions. Patchy opacities in the lung apices may represent aspiration and/or pneumonia. Review of the MIP images confirms the above findings CTA HEAD FINDINGS Anterior circulation: No significant proximal stenosis or large vessel occlusion. Status post treatment of a left MCA aneurysm with web flow disrupted device. There is some expected flow within the aneurysm sac. Approximately 2 mm left posterior communicating artery aneurysm (see series 7, image 109). Distal left M2/M3 MCA branch stenosis was better characterized on the prior MRA. Posterior circulation: No significant stenosis, proximal occlusion, aneurysm, or vascular malformation. Mild stenosis of the basilar artery. Venous sinuses: As permitted by contrast timing, patent. Review of the MIP images confirms the above findings IMPRESSION: 1. No large vessel occlusion or new hemodynamically  significant proximal stenosis in the head or neck. 2. Status post  treatment of a left MCA aneurysm with web flow disrupted device. There is some expected flow within the aneurysm sac. 3. 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 4. Small bilateral pleural effusions. Patchy opacities in the lung apices may represent aspiration and/or pneumonia. Additionally, there appears to be a new 1 cm subpleural nodular opacity in the imaged lateral left upper lobe that is new since CT chest from October 29, 2019. While this most likely represents an infectious/inflammatory process, recommend follow-chest CT when the patient is able to exclude pulmonary nodule. Critical findings discussed with Dr. Marjory Lies at 2:24 PM and Dr. Leonie Man at 2:45 PM via telephone. Lung findings discussed with Dr. Leonie Man at 3:00 PM. Electronically Signed   By: Margaretha Sheffield MD   On: 05/04/2020 15:05   IR ANGIO INTRA EXTRACRAN SEL INTERNAL CAROTID UNI L MOD SED  Result Date: 05/07/2020 CLINICAL DATA:  Patient with headaches and multiple intracranial aneurysms. Patient admitted for unruptured left middle cerebral artery aneurysm.   EXAM: TRANSCATHETER THERAPY EMBOLIZATION   COMPARISON:  Diagnostic catheter arteriogram of February 06, 2020.   MEDICATIONS: Heparin 3,000 units IV. Ancef 2 g IV antibiotic was administered within 1 hour.   ANESTHESIA/SEDATION: General anesthesia   CONTRAST:  Isovue 300 approximately 120 mL   FLUOROSCOPY TIME:  Fluoroscopy Time: 35 minutes 18 seconds (1457 mGy).   COMPLICATIONS: None immediate.   TECHNIQUE: Informed written consent was obtained from the patient after a thorough discussion of the procedural risks, benefits and alternatives. All questions were addressed. Maximal Sterile Barrier Technique was utilized including caps, mask, sterile gowns, sterile gloves, sterile drape, hand hygiene and skin antiseptic. A timeout was performed prior to the initiation of the procedure.    Initially a radial access was obtained with ultrasound guidance after having documented the right radial artery morphology with ultrasound. However, due to persistent vaso spasm induced by the micro guidewire, this approach was abandoned.   The right groin was prepped and draped in the usual sterile fashion. Thereafter using modified Seldinger technique, transfemoral access into the right common femoral artery was obtained without difficulty. Over a 0.035 inch guidewire, an 8 [place] 25 cm Pinnacle sheath was inserted. Through this, and also over 0.035 inch guidewire, a 5 [place] JB 1 catheter was advanced to the aortic arch region and selectively positioned in the left common carotid artery.   FINDINGS:.: FINDINGS:. The left common carotid arteriogram demonstrates the left external carotid artery and its major branches to be widely patent.   The left internal carotid artery at the bulb to the cranial skull base is also widely patent.   Patency is maintained into the petrous, cavernous and supraclinoid segments. Again demonstrated are 2 small saccular outpouchings projecting posteriorly from the supraclinoid left ICA, one in the region of the origin of the anterior choroidal artery.   The left middle cerebral artery and the left anterior cerebral artery opacify into the capillary and venous phases. Prompt filling via the anterior communicating artery of the right anterior cerebral A2 segment is seen.   Arising at the bifurcation of the left middle cerebral artery again noted is a saccular aneurysm with a relatively wide neck projecting slightly anteriorly.   A 3D rotational arteriogram was then performed from the left common carotid artery extending intracranially.   Reconstruction was then performed on a separate workstation. Optimal views were  then obtained for treatment of the aneurysm. The aneurysm measured to be approximately 4.1 mm in its maximum width, and 3.82 mm in height. It was decided to use a WEB  SL W5-4.3 for intra saccular disruption device for embolization.   ENDOVASCULAR EMBOLIZATION OF LEFT MIDDLE CEREBRAL ARTERY BIFURCATION REGION ANEURYSM   The diagnostic JB 1 catheter in the left common carotid artery was then exchanged over a 0.035 inch 300 cm Rosen exchange guidewire for a 90 cm 6 [place] Infinity sheath. This was advanced to the bifurcation of the left common carotid artery. The guidewire was removed. Good aspiration obtained from the hub of the Infinity sheath. A gentle control arteriogram performed through this demonstrated no evidence of vasospasm or of intraluminal filling defects. This was then connected to continuous heparinized saline infusion. Through the Infinity sheath, a combination of a 6 [place] 115 cm intermediary catheter inside of which was a Via 17 microcatheter was advanced over a 0.014 inch standard Synchro micro guidewire with a J configuration to the proximal cavernous segment. At this time, the Infinity guide sheath was advanced to the junction of the cervical petrous junction. Using biplane roadmap technique and constant fluoroscopic guidance, in a coaxial manner and with constant heparinized saline infusion, the micro guidewire was then gently advanced with a J-tip configuration to the left middle cerebral artery followed by the microcatheter. The micro guidewire was then gently advanced into the aneurysm followed by the microcatheter.   The guidewire was removed. Good aspiration obtained from the hub of the microcatheter. This was then connected to continuous heparinized saline infusion.   The WEB SL W5-4.3 intra saccular flow disrupter was then prepped and purged in heparinized saline. It was cleared of any air bubbles. This was then recaptured into the housing catheter.   This was then loaded onto the microcatheter after heparin flushed in the Tuohy Borst.   Smooth advancement was obtained to the middle cerebral artery very slowly ensuring no forward motion or  movements of the microcatheter which was just inside the neck of the aneurysm.   The distal marker on the WEB device was advanced to the distal marker on the microcatheter.   The entire system was then straightened. With the microcatheter stable, the WEB device was advanced until early formation of a [place] configuration.   A control arteriogram performed through the [facility] guide catheter demonstrated safe position and orientation of the device within the aneurysm.   The microcatheter was then gently advanced further into the aneurysm followed by advancement of the WEB device into the aneurysm. Complete opening of the device was noted.   A control arteriogram performed through the [facility] guide catheter demonstrated complete opening and apposition of the device into the aneurysm.   The superior and inferior branches of the left middle cerebral artery were patent without evidence of encroachment on the bifurcation branches.   With the WEB device well oriented in the AP and lateral projections, the microcatheter was gently withdrawn to just proximal to the proximal marker on the WEB device. Again the orientation of the WEB device within the aneurysm remained unchanged.   A control arteriogram performed through the [facility] guide catheter demonstrated excellent apposition on the AP and lateral projections with contrast stasis within the aneurysm.   Under constant biplane fluoroscopy, the device was disconnected and deployed using the detachment device at the proximal aspect of the delivery wire.   The micro guidewire was gently advanced just distal to the microcatheter then withdrawn.  A control arteriogram was then performed through the [facility] guide catheter in the left internal carotid artery which continued to demonstrate excellent flow through the left middle cerebral artery distribution with patency of the superior and inferior divisions, and stasis within the aneurysm itself.   Control arteriograms were then performed  at 10 and 20 minutes post deployment of the device. These continued to demonstrated no evidence of intraluminal filling defects, or of occlusions in the left middle cerebral artery and the left anterior cerebral artery distributions.   The [place] 6 [place] guide catheter was removed. The Infinity sheath was then retrieved to just proximal to the left common carotid artery bifurcation. Control arteriogram performed through this continued to demonstrate wide patency of the of the left internal carotid artery proximally and distally with no change in the intra saccular device within left middle cerebral artery aneurysm.   The Infinity sheath was removed. The 8 French Pinnacle sheath was then removed with the successful application of an 8 French Angio-Seal closure device for hemostasis. Distal pulses remained palpable in both feet unchanged.   [name] ACT was in the region of approximately 250. This was partially reversed with 5 mg of protamine sulfate.   [name] general anesthesia was slowly reversed. She was able to follow simple commands following the extubation. She was transferred to PACU for post extubation management.   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   PLAN: Patient to be left on aspirin 81 mg a day and Plavix 75 mg per day for 2 weeks. Follow-up in the clinic 2 weeks post discharge.     Electronically Signed   By: Luanne Bras M.D.   On: 05/05/2020 09:52   Labs:  CBC: Recent Labs    04/24/20 1225 04/27/20 0719 05/04/20 1645 05/05/20 0436  WBC 7.2 12.1* 14.0* 14.3*  HGB 6.8* 10.1* 8.8* 7.8*  HCT 25.0* 33.6* 29.7* 27.5*  PLT 277 296 229 200    COAGS: Recent Labs    11/19/19 1317 02/06/20 0828 04/27/20 0719  INR 1.0 1.0 1.1  APTT 26  --  29    BMP: Recent Labs    11/20/19 0433 11/22/19 1139 01/24/20 1212 02/06/20 0718 02/06/20 1124  04/27/20 0719 05/04/20 1645 05/05/20 0436  NA 139 136 139 137 142 138 137 140  K 4.2 4.2 4.6 6.4* 5.4* 4.9 5.0 4.4  CL 103 103 107 103 108 105 108 109  CO2 '26 25 23 24  ' --  24 21* 21*  GLUCOSE 131* 169* 232* 410* 269* 228* 242* 106*  BUN 16 12 26* 28* 31* 24* 27* 22  CALCIUM 9.9 9.8 10.8* 10.4*  --  10.1 8.7* 8.8*  CREATININE 1.01*  1.04* 1.03* 1.21* 1.56* 1.30* 1.58* 1.26* 1.12*  GFRNONAA 57*  55* 55* 46* 34*  --  35* 46* 53*  GFRAA >60  >60 >60 53* 39*  --   --   --   --     LIVER FUNCTION TESTS: Recent Labs    07/19/19 1340 11/19/19 1317 01/24/20 1212 05/04/20 1645  BILITOT 0.5 0.7 0.3 0.6  AST 77* '20 28 31  ' ALT 119* 23 56* 65*  ALKPHOS 330* 159* 237* 192*  PROT 7.4 6.9 7.2 5.3*  ALBUMIN 3.9 3.9 3.9 3.0*    Assessment and Plan:  Lt common carotid arteriogram followed by embolization of L tMCA aneurysm with a 4.62m x 48mWEB  flow disrupter device by Dr. Estanislado Pandy on 05/04/20.  She had sudden onset of altered mental status after the procedure.  Right groin exam remains benign.  Dr. Estanislado Pandy explained she may need Botox for the neck torticollis.  Constipation = will order magnesium citrate as patient states senokot may not work.  It is ok for her family to bring her some pajamas so she doesn't have to wear the hospital gown.  I will bring her a Christmas gift tomorrow.   Electronically Signed: Murrell Redden, PA-C 05/08/2020, 9:18 AM    I spent a total of 15 Minutes at the the patient's bedside AND on the patient's hospital floor or unit, greater than 50% of which was counseling/coordinating care for f/u cerebral intervention.

## 2020-05-08 NOTE — Progress Notes (Signed)
Neurology Consultation Reason for Consult: follow up aneurysm coiling, stroke Referring Physician: Estanislado Pandy  CC: s/p aneurysm repair, stroke; Asked to assist primary team with weekend coverage  History is obtained from: the patient, treatment team, chart  Interval History: Bethany [name] is a 69 y.o. female who had a left parietal (LMCA) stroke in July. Aneurysm was identified, and a coiling procedure/repair was performed on the date of admission, 05/04/20. There were very few residual deficits after her initial stroke. In recovery following the aneurysm repair, she began to have repetitive speech with NIHSS = 8. Stroke alert was called, and vessels were patent. Seizure suspected, as the patient was observed to have involuntary jerking in the right arm, with asterixis and flapping tremor. She was not eligible for tPA or thrombectomy due to being on heparin, and having no LVO. See Dr. Clydene Fake thorough note from 05/04/20. MRI done the following day (05/05/20) revealed:  "1. No acute intracranial infarct or other abnormality. 2. Evolving encephalomalacia with laminar necrosis involving the posterior left temporal region related to previously identified left MCA territory infarct. 3. Underlying mild chronic microvascular ischemic disease. 4. Right mastoid effusion."  The EEG done that same day showed:  "This study is suggestive of cortical dysfunction in left temporal region secondary to underlying structural abnormality/stroke. Additionally there is mild diffuse encephalopathy, nonspecific etiology. No seizures or epileptiform discharges were seen throughout the recording."  She is currently treated with ASA/Plavix s/p coiling. No anti-seizure medications are prescribed, other than gabapentin and pregabalin for chronic pain.  This morning she reports a brief episode during which she could not get her right leg to cooperate. She was trying to get out of bed, and "forgot" how to move the right leg  momentarily. There were no arm/face/speech changes, though she continues to have some word-finding pauses. Symptoms have resolved.  ROS: A 14 point ROS was performed and is negative except as noted in the HPI. She reports an unusual   Past Medical History:  Diagnosis Date  . Acute renal failure ([place])   . Anemia   . Arthritis   . COPD (chronic obstructive pulmonary disease) ([place])   . Diabetes mellitus without complication ([place])    type II   . GERD (gastroesophageal reflux disease)   . Hypercalcemia   . Hyperlipidemia   . Hypertension   . Pneumonia   . Renal disorder   . Single kidney   . Stroke [facility])    July 2021  . Thrombosis   . Tobacco abuse     Family History  Problem Relation Age of Onset  . Hypertension Mother   . Diabetes Father   . Hypertension Brother   . Breast cancer Maternal Aunt        64s  . Breast cancer Maternal Aunt 75       colon cancer    Social History:  reports that she has been smoking cigarettes. She has a 10.00 pack-year smoking history. She has never used smokeless tobacco. She reports current alcohol use. She reports that she does not use drugs.   Exam: Current vital signs: BP 138/61 (BP Location: Left Arm)   Pulse (!) 57   Temp 98.2 F (36.8 C) (Oral)   Resp 20   Ht 5\' 5"  (1.651 m)   Wt 81.8 kg   SpO2 100%   BMI 30.01 kg/m  Vital signs in last 24 hours: Temp:  [98.2 F (36.8 C)-98.9 F (37.2 C)] 98.2 F (36.8 C) (12/24 0803) Pulse Rate:  [  54-66] 57 (12/24 0803) Resp:  [16-20] 20 (12/24 0803) BP: (133-145)/(43-61) 138/61 (12/24 0803) SpO2:  [99 %-100 %] 100 % (12/24 0803)   Physical Exam  Constitutional: Appears well-developed and well-nourished.  Psych: Affect appropriate to situation Eyes: No scleral injection HENT: No OP obstrucion MSK: no joint deformities.  Cardiovascular: Normal rate and regular rhythm.  Respiratory: Effort normal, non-labored breathing GI: Soft.  No distension. There is no tenderness.  Skin:  WDI  Neuro: Mental Status: Patient is awake, alert, oriented to person, place, month, year, and situation. Patient is able to give a clear and coherent history. No signs of aphasia or neglect. Cranial Nerves: II: Visual Fields are full. Pupils are equal, round, and reactive to light.  III,IV, VI: EOMI without ptosis or diploplia.  V: Facial sensation is symmetric to temperature VII: Facial movement is symmetric, but she has mild flattening of the right NLF. VIII: hearing is intact to voice X: Uvula elevates symmetrically XI: Shoulder shrug is symmetric. XII: tongue is midline without atrophy or fasciculations.  Motor: Tone is normal. Bulk is normal. 5/5 strength was present in all four extremities. No drift of either arm or leg. Sensory: Sensation is asymmetric to light touch and temperature, with more intense sensation in the right arm and leg. Deep Tendon Reflexes: 2+ and symmetric in the biceps and brachioradialis, 1+ in the triceps, trace to absent in the knees and ankles. Toes are downgoing bilaterally. Cerebellar: FNF and FTN are intact bilaterally Gait: Not tested  I have reviewed labs in epic and the results pertinent to this consultation are: No recent BMP/CBC (most recent 12/21): Results for FEDRA, SATURNO (MRN [id number]) as of 05/08/2020 11:13  Ref. Range 05/05/2020 04:36  Sodium Latest Ref Range: 135 - 145 mmol/L 140  Potassium Latest Ref Range: 3.5 - 5.1 mmol/L 4.4  Chloride Latest Ref Range: 98 - 111 mmol/L 109  CO2 Latest Ref Range: 22 - 32 mmol/L 21 (L)  Glucose Latest Ref Range: 70 - 99 mg/dL 106 (H)  BUN Latest Ref Range: 8 - 23 mg/dL 22  Creatinine Latest Ref Range: 0.44 - 1.00 mg/dL 1.12 (H)  Calcium Latest Ref Range: 8.9 - 10.3 mg/dL 8.8 (L)  Results for MICAYAH, PREWITT (MRN [id number]) as of 05/08/2020 11:13  Ref. Range 05/05/2020 04:36  WBC Latest Ref Range: 4.0 - 10.5 K/uL 14.3 (H)  RBC Latest Ref Range: 3.87 - 5.11 MIL/uL 3.01 (L)  Hemoglobin Latest  Ref Range: 12.0 - 15.0 g/dL 7.8 (L)  HCT Latest Ref Range: 36.0 - 46.0 % 27.5 (L)  MCV Latest Ref Range: 80.0 - 100.0 fL 91.4  MCH Latest Ref Range: 26.0 - 34.0 pg 25.9 (L)  MCHC Latest Ref Range: 30.0 - 36.0 g/dL 28.4 (L)  RDW Latest Ref Range: 11.5 - 15.5 % 18.4 (H)  Platelets Latest Ref Range: 150 - 400 K/uL 200   I have reviewed the images obtained: MRI, angio, CT  Impression:  1) Status post endovascular embolization of wide neck left middle cerebral artery aneurysm using the WEB intra saccular flow disruption device. 2) On ASA/Plavix 3) Episode of arm jerking without recurrence to suggest need for seizure medicine 4) Transient difficulty with word-finding pauses and initiation of other motor movements like leg movements 5) gait impairment and recommendation for inpatient rehab 6) chronic pain syndrome affecting lower back and neck with opiate dependence (on oxycontin 30 bid) 7) DM2, chronic headaches, hyperlipidemia, COPD, HTN, and multiple other problems (see problem list)  Recommendations: 1)  CBC, BMP to monitor creatinine s/p dye load 2) Continue BP control, lipid management, glucose control, and DAPT with ASA/clopidogrel 3) Anticipate rehab disposition early this coming week to maximize safety with gait/mobility 4) See Ms. Blair's note for additional plans from radiology.  Will continue to follow over the weekend.  Perfecto Kingdom, MD

## 2020-05-09 LAB — GLUCOSE, CAPILLARY
Glucose-Capillary: 222 mg/dL — ABNORMAL HIGH (ref 70–99)
Glucose-Capillary: 110 mg/dL — ABNORMAL HIGH (ref 70–99)
Glucose-Capillary: 273 mg/dL — ABNORMAL HIGH (ref 70–99)
Glucose-Capillary: 233 mg/dL — ABNORMAL HIGH (ref 70–99)
Glucose-Capillary: 195 mg/dL — ABNORMAL HIGH (ref 70–99)

## 2020-05-09 MED ORDER — DICLOFENAC EPOLAMINE 1.3 % EX PTCH
1.0000 | MEDICATED_PATCH | Freq: Two times a day (BID) | CUTANEOUS | Status: DC
  Administered 2020-05-09 – 2020-05-11 (×5): 1 via TRANSDERMAL
  Filled 2020-05-09 (×6): qty 1

## 2020-05-09 NOTE — Progress Notes (Signed)
Neurology Consultation Reason for Consult: follow up aneurysm coiling, stroke Referring Physician: Estanislado Pandy  CC: s/p aneurysm repair, stroke; Asked to assist primary team with weekend coverage  History is obtained from: the patient, treatment team, chart  Interval History: Bethany [name] is still complaining of neck pain. She has not had any further spells of difficult movement. She has been able to walk without the walker, and feels that her speech/word-finding pauses are improving, too. Her biggest concern is the neck pain, and she wondered if that might be genetic. I told her this is unlikely at her age.  Prior Visit: Bethany [name] is a 69 y.o. female who had a left parietal (LMCA) stroke in July. Aneurysm was identified, and a coiling procedure/repair was performed on the date of admission, 05/04/20. There were very few residual deficits after her initial stroke. In recovery following the aneurysm repair, she began to have repetitive speech with NIHSS = 8. Stroke alert was called, and vessels were patent. Seizure suspected, as the patient was observed to have involuntary jerking in the right arm, with asterixis and flapping tremor. She was not eligible for tPA or thrombectomy due to being on heparin, and having no LVO. See Dr. Clydene Fake thorough note from 05/04/20. MRI done the following day (05/05/20) revealed:  "1. No acute intracranial infarct or other abnormality. 2. Evolving encephalomalacia with laminar necrosis involving the posterior left temporal region related to previously identified left MCA territory infarct. 3. Underlying mild chronic microvascular ischemic disease. 4. Right mastoid effusion."  The EEG done that same day showed:  "This study is suggestive of cortical dysfunction in left temporal region secondary to underlying structural abnormality/stroke. Additionally there is mild diffuse encephalopathy, nonspecific etiology. No seizures or epileptiform discharges were seen  throughout the recording."  She is currently treated with ASA/Plavix s/p coiling. No anti-seizure medications are prescribed, other than gabapentin and pregabalin for chronic pain.  This morning she reports a brief episode during which she could not get her right leg to cooperate. She was trying to get out of bed, and "forgot" how to move the right leg momentarily. There were no arm/face/speech changes, though she continues to have some word-finding pauses. Symptoms have resolved.  ROS: A 14 point ROS was performed and is negative except as noted in the HPI. She reports an unusual   Past Medical History:  Diagnosis Date  . Acute renal failure ([place])   . Anemia   . Arthritis   . COPD (chronic obstructive pulmonary disease) ([place])   . Diabetes mellitus without complication ([place])    type II   . GERD (gastroesophageal reflux disease)   . Hypercalcemia   . Hyperlipidemia   . Hypertension   . Pneumonia   . Renal disorder   . Single kidney   . Stroke [facility])    July 2021  . Thrombosis   . Tobacco abuse     Family History  Problem Relation Age of Onset  . Hypertension Mother   . Diabetes Father   . Hypertension Brother   . Breast cancer Maternal Aunt        61s  . Breast cancer Maternal Aunt 75       colon cancer    Social History:  reports that she has been smoking cigarettes. She has a 10.00 pack-year smoking history. She has never used smokeless tobacco. She reports current alcohol use. She reports that she does not use drugs.   Exam: Current vital signs: BP (!) 156/59 (BP Location:  Left Arm)   Pulse 62   Temp 98.4 F (36.9 C) (Oral)   Resp 18   Ht 5\' 5"  (1.651 m)   Wt 81.8 kg   SpO2 100%   BMI 30.01 kg/m  Vital signs in last 24 hours: Temp:  [97.9 F (36.6 C)-98.8 F (37.1 C)] 98.4 F (36.9 C) (12/25 1126) Pulse Rate:  [53-66] 62 (12/25 1126) Resp:  [18-20] 18 (12/25 1126) BP: (142-161)/(45-62) 156/59 (12/25 1126) SpO2:  [98 %-100 %] 100 % (12/25  1126)   Physical Exam  Constitutional: Appears well-developed and well-nourished.  Psych: Affect appropriate to situation MSK: tender to palpation over the neck muscles. I can feel a knot in the cervical paraspinal muscles to the left of midline. Cardiovascular: Normal rate and regular rhythm.  Respiratory: Effort normal, non-labored breathing  Neuro: Mental Status: Patient is awake, alert, oriented to person, place, month, year, and situation. Patient is able to give a clear and coherent history. No signs of aphasia or neglect. Cranial Nerves: II: Visual Fields are full. Pupils are equal, round, and reactive to light.  III,IV, VI: EOMI without ptosis or diploplia.  V: Facial sensation is symmetric to temperature VII: Facial movement is symmetric. VIII: hearing is intact to voice X: deferred XI: deferred XII: deferred  Motor: Tone is normal. Bulk is normal. 5/5 strength was present in all four extremities. No drift  Sensory: deferred Deep Tendon Reflexes: 2+ and symmetric in the biceps and brachioradialis, 1+ in the triceps, bilaterally. Cerebellar: FNF and FTN are intact bilaterally Gait: Not tested  I have reviewed labs in epic and the results pertinent to this consultation are: Lab Results  Component Value Date   WBC 9.4 05/08/2020   HGB 8.5 (L) 05/08/2020   HCT 29.3 (L) 05/08/2020   PLT 246 05/08/2020   GLUCOSE 219 (H) 05/08/2020   CHOL 130 05/05/2020   TRIG 160 (H) 05/05/2020   HDL 30 (L) 05/05/2020   LDLCALC 68 05/05/2020   ALT 65 (H) 05/04/2020   AST 31 05/04/2020   NA 139 05/08/2020   K 4.4 05/08/2020   CL 108 05/08/2020   CREATININE 1.08 (H) 05/08/2020   BUN 15 05/08/2020   CO2 24 05/08/2020   TSH 3.079 11/20/2019   INR 1.1 04/27/2020   HGBA1C 4.7 (L) 05/04/2020   I have reviewed the images obtained: MRI, angio, CT  Impression:  1) Status post endovascular embolization of 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 2) On ASA/Plavix 3) Episode of arm jerking without recurrence to suggest need for seizure medicine 4) Transient difficulty with word-finding pauses and initiation of other motor movements like leg movements 5) gait impairment and recommendation for inpatient rehab 6) chronic pain syndrome affecting lower back and neck with opiate dependence (on oxycontin 30 bid); She appears to have a knot in her cervical paraspinal muscles, and c/o pain to palpation over these muscles. 7) DM2, chronic headaches, hyperlipidemia, COPD, HTN, and multiple other problems (see problem list)  Recommendations: 1) CBC, BMP are OK, showing no sign of nephrotoxicity 2) Continue BP control, lipid management, glucose control, and DAPT with ASA/clopidogrel 3) Anticipate rehab disposition early this coming week to maximize safety with gait/mobility 4) See Ms. Blair's note for additional plans from radiology. 5) will add diclofenac patch over the left side of her neck for symptomatic pain relief.  Will continue to follow over the weekend.  Perfecto Kingdom, MD

## 2020-05-09 NOTE — Progress Notes (Signed)
Referring Physician(s): Leonie Man  Supervising Physician: Luanne Bras  Patient Status:  [facility] - In-pt  Chief Complaint:  F/U Aneurysm Embo  Brief History:  Ms. Ketchem is a 69 year old female with known prior history of left MCA infarct with mild residual aphasia and right-sided weakness.  She underwent Lt common carotid arteriogram followed by embolization of L tMCA aneurysm with a 4.42m x 427mWEB flow disrupter device by Dr. DeEstanislado Pandyn 05/04/20.  She had sudden onset of altered mental status after the procedure. Dr. SeLeonie Manas consulted. His note reads = encephalopathy etiology indeterminate likely small multiple emboli in the left MCA distribution post post procedure.  She is currently awaiting inpatient rehab placement for mobility prior to discharge home.   Subjective:  Sitting up in bed, doing ok. I brought a couple of Christmas gifts. This made her very happy! She was quite apPatent attorney She finally had a BM this am. She feels much better.  She has her Christmas PJs but she was told she couldn't wear them because they don't have a pocket for her telemetry pack.   Allergies: Contrast media [iodinated diagnostic agents]  Medications: Prior to Admission medications   Medication Sig Start Date End Date Taking? Authorizing Provider  allopurinol (ZYLOPRIM) 100 MG tablet Take 100 mg by mouth at bedtime.   Yes [provider]  amLODipine (NORVASC) 10 MG tablet Take 10 mg by mouth daily.   Yes [provider]  APPLE CIDER VINEGAR PO Take 1 tablet by mouth daily.   Yes [provider]  aspirin EC 81 MG EC tablet Take 1 tablet (81 mg total) by mouth daily. Swallow whole. 11/22/19  Yes Danford, ChSuann LarryMD  atorvastatin (LIPITOR) 80 MG tablet Take 1 tablet (80 mg total) by mouth daily. 11/22/19  Yes Danford, ChSuann LarryMD  baclofen (LIORESAL) 10 MG tablet Take 10 mg by mouth 3 (three) times daily as needed for muscle spasms.   Yes  [provider]  buprenorphine (BUTRANS) 20 MCG/HR PTWK Place 1 patch onto the skin every Monday.  11/15/19  Yes [provider]  clopidogrel (PLAVIX) 75 MG tablet Take 75 mg by mouth daily.   Yes [provider]  Dulaglutide (TRULICITY) 1.5 MGFB/3.7HKOPN Inject 1.5 mg into the skin every Monday.    Yes [provider]  ezetimibe (ZETIA) 10 MG tablet Take 10 mg by mouth daily.   Yes [provider]  gabapentin (NEURONTIN) 600 MG tablet Take 600 mg by mouth 4 (four) times daily.    Yes [provider]  insulin glargine, 1 Unit Dial, (TOUJEO SOLOSTAR) 300 UNIT/ML Solostar Pen Inject 20 Units into the skin at bedtime.    Yes [provider]  Ipratropium-Albuterol (COMBIVENT) 20-100 MCG/ACT AERS respimat Inhale 1 puff into the lungs every 6 (six) hours as needed for wheezing or shortness of breath. 03/17/17  Yes Hongalgi, AnLenis DickinsonMD  levothyroxine (SYNTHROID) 75 MCG tablet Take 75 mcg by mouth daily. 02/28/19  Yes [provider]  losartan (COZAAR) 25 MG tablet Take 1 tablet (25 mg total) by mouth daily. 11/22/19 11/21/20 Yes Danford, ChSuann LarryMD  metoprolol (LOPRESSOR) 50 MG tablet Take 50 mg by mouth 2 (two) times daily.  03/06/14  Yes [provider]  Multiple Vitamin (MULTIVITAMIN WITH MINERALS) TABS tablet Take 1 tablet by mouth daily.   Yes [provider]  oxyCODONE-acetaminophen (PERCOCET) 10-325 MG tablet Take 1 tablet by mouth every 6 (six) hours as  needed (breakthrough pain).    Yes [provider]  pregabalin (LYRICA) 25 MG capsule Take 25 mg by mouth 2 (two) times daily.  01/24/19  Yes [provider]  sennosides-docusate sodium (SENOKOT-S) 8.6-50 MG tablet Take 1 tablet by mouth at bedtime.   Yes [provider]  Vitamin D, Ergocalciferol, (DRISDOL) 1.25 MG (50000 UNIT) CAPS capsule Take 50,000 Units by mouth every Monday.    Yes [provider]  XTAMPZA ER 27 MG C12A  Take 27 mg by mouth 2 (two) times daily.  02/27/19  Yes [provider]  feeding supplement, ENSURE ENLIVE, (ENSURE ENLIVE) LIQD Take 237 mLs by mouth 2 (two) times daily between meals. 03/30/19   Bonnell Public, MD  furosemide (LASIX) 40 MG tablet Take 1 tablet (40 mg total) by mouth daily as needed. Patient taking differently: Take 40 mg by mouth daily as needed for fluid. 05/20/19 05/19/20  O'NealCassie Freer, MD  lidocaine-prilocaine (EMLA) cream Apply 1 application topically as needed. Patient taking differently: Apply 1 application topically daily as needed (port access). 07/03/19   Truitt Merle, MD  [facility] 4 MG/0.1ML LIQD nasal spray kit Place 1 spray into the nose as needed for opioid reversal. 08/22/19   [provider]  pantoprazole (PROTONIX) 40 MG tablet Take 40 mg by mouth every morning.    [provider]     Vital Signs: BP (!) 144/45 (BP Location: Left Arm)   Pulse (!) 56   Temp 98.2 F (36.8 C) (Oral)   Resp 18   Ht _0  (1.651 m)   Wt 81.8 kg   SpO2 100%   BMI 30.01 kg/m   Physical Exam Vitals reviewed.  Constitutional:      Appearance: Normal appearance.  Cardiovascular:     Rate and Rhythm: Normal rate.  Pulmonary:     Effort: Pulmonary effort is normal. No respiratory distress.  Neurological:     General: No focal deficit present.     Mental Status: She is alert and oriented to person, place, and time.  Psychiatric:        Mood and Affect: Mood normal.        Behavior: Behavior normal.        Thought Content: Thought content normal.        Judgment: Judgment normal.     Imaging: EEG adult  Result Date: 05/05/2020 Lora Havens, MD     05/05/2020  9:26 AM Patient Name: MAKENZI BANNISTER MRN: [id number] Epilepsy Attending: Lora Havens Referring Physician/Provider: Dr Antony Contras Date: 05/04/2020 Duration: 25.07 mins Patient history: 69 year old lady with known prior history of left MCA infarct with mild residual aphasia  and right-sided weakness with sudden onset of altered mental status following elective left middle cerebral artery bifurcation aneurysm successful coiling with exam consistent with aphasia and mild right-sided weakness and encephalopathy. EEG to evaluate for seizure Level of alertness: Awake AEDs during EEG study: GBP, PGB Technical aspects: This EEG study was done with scalp electrodes positioned according to the 10-20 International system of electrode placement. Electrical activity was acquired at a sampling rate of _1  and reviewed with a high frequency filter of _2  and a low frequency filter of _3 . EEG data were recorded continuously and digitally stored. Description: The posterior dominant rhythm consists of 8 Hz activity of moderate voltage (25-35 uV) seen predominantly in posterior head regions, symmetric and reactive to eye opening and eye closing. EEG showed continuous left temporal 3 to 6  Hz theta-delta slowing as well as intermittent generalized 3 to 6 Hz theta-delta slowing. Hyperventilation and photic stimulation were not performed.   ABNORMALITY -Continuous slow, left temporal IMPRESSION: This study is suggestive of cortical dysfunction in left temporal region secondary to underlying structural abnormality/stroke. Additionally there is mild diffuse encephalopathy, nonspecific etiology. No seizures or epileptiform discharges were seen throughout the recording. Priyanka O Yadav   VAS [place] GROIN PSEUDOANEURYSM  Result Date: 05/05/2020  ARTERIAL PSEUDOANEURYSM  Exam: Right groin Indications: Patient complains of groin pain. History: Status post left carotid arterigram follow by emblomization left tMCA aneurysm. Comparison Study: No prior. Performing Technologist: Oda Cogan RDMS, RVT  Examination Guidelines: A complete evaluation includes B-mode imaging, spectral Doppler, color Doppler, and power Doppler as needed of all accessible portions of each vessel. Bilateral testing is considered an  integral part of a complete examination. Limited examinations for reoccurring indications may be performed as noted. +------------+----------+--------+------+----------+ Right DuplexPSV (cm/s)WaveformPlaqueComment(s) +------------+----------+--------+------+----------+ CFA            116                    Patent   +------------+----------+--------+------+----------+ Prox SFA       188                    Patent   +------------+----------+--------+------+----------+ Right Vein comments:Patent right common femoral vein.  Summary: No evidence of right groin pseudoaneurysm or AVF.  Diagnosing physician: Harold Barban MD Electronically signed by Harold Barban MD on 05/05/2020 at 6:06:38 PM.   --------------------------------------------------------------------------------    Final     Labs:  CBC: Recent Labs    04/27/20 0719 05/04/20 1645 05/05/20 0436 05/08/20 1316  WBC 12.1* 14.0* 14.3* 9.4  HGB 10.1* 8.8* 7.8* 8.5*  HCT 33.6* 29.7* 27.5* 29.3*  PLT 296 229 200 246    COAGS: Recent Labs    11/19/19 1317 02/06/20 0828 04/27/20 0719  INR 1.0 1.0 1.1  APTT 26  --  29    BMP: Recent Labs    11/20/19 0433 11/22/19 1139 01/24/20 1212 02/06/20 0718 02/06/20 1124 04/27/20 0719 05/04/20 1645 05/05/20 0436 05/08/20 1316  NA 139 136 139 137   < > 138 137 140 139  K 4.2 4.2 4.6 6.4*   < > 4.9 5.0 4.4 4.4  CL 103 103 107 103   < > 105 108 109 108  CO2 _0 --  24 21* 21* 24  GLUCOSE 131* 169* 232* 410*   < > 228* 242* 106* 219*  BUN 16 12 26* 28*   < > 24* 27* 22 15  CALCIUM 9.9 9.8 10.8* 10.4*  --  10.1 8.7* 8.8* 9.9  CREATININE 1.01*  1.04* 1.03* 1.21* 1.56*   < > 1.58* 1.26* 1.12* 1.08*  GFRNONAA 57*  55* 55* 46* 34*  --  35* 46* 53* 55*  GFRAA >60  >60 >60 53* 39*  --   --   --   --   --    < > = values in this interval not displayed.    LIVER FUNCTION TESTS: Recent Labs    07/19/19 1340 11/19/19 1317 01/24/20 1212 05/04/20 1645  BILITOT  0.5 0.7 0.3 0.6  AST 77* _1 ALT 119* 23 56* 65*  ALKPHOS 330* 159* 237* 192*  PROT 7.4 6.9 7.2 5.3*  ALBUMIN 3.9 3.9 3.9 3.0*    Assessment and Plan:  Lt common carotid arteriogram followed by  embolization of L tMCA aneurysm with a 4.59m x 441mWEB flow disrupter device by Dr. DeEstanislado Pandyn 05/04/20.  Awaiting inpt rehab bed.  Enjoyed unwrapping her gifts!  Will D/C telemetry so she can wear pajamas so she doesn't have to wear the hospital gown.   Electronically Signed: WEMurrell ReddenPA-C 05/09/2020, 8:50 AM    I spent a total of 15 Minutes at the the patient's bedside AND on the patient's hospital floor or unit, greater than 50% of which was counseling/coordinating care for f/u cerebral intervention.

## 2020-05-10 LAB — GLUCOSE, CAPILLARY
Glucose-Capillary: 179 mg/dL — ABNORMAL HIGH (ref 70–99)
Glucose-Capillary: 186 mg/dL — ABNORMAL HIGH (ref 70–99)
Glucose-Capillary: 236 mg/dL — ABNORMAL HIGH (ref 70–99)
Glucose-Capillary: 261 mg/dL — ABNORMAL HIGH (ref 70–99)
Glucose-Capillary: 294 mg/dL — ABNORMAL HIGH (ref 70–99)
Glucose-Capillary: 304 mg/dL — ABNORMAL HIGH (ref 70–99)
Glucose-Capillary: 398 mg/dL — ABNORMAL HIGH (ref 70–99)

## 2020-05-10 NOTE — Progress Notes (Signed)
Referring Physician(s): Leonie Man  Supervising Physician: Luanne Bras  Patient Status:  [facility] - In-pt  Chief Complaint:  F/U Aneurysm embo  Brief History:  Bethany [name] is a57 year oldfemalewith known prior history of left MCA infarct with mild residual aphasia and right-sided weakness.  She underwentLt common carotid arteriogram followed by embolization of L tMCA aneurysm with a 4.46m x 468mWEB flow disrupter deviceby Dr. DeEstanislado Pandyn 05/04/20.  She hadsudden onset of altered mental status after the procedure. Dr. SeLeonie Manas consulted. His note reads =encephalopathy etiology indeterminate likely small multiple emboli in the left MCA distribution post post procedure.  She is currently awaiting inpatient rehab placement for mobility prior to discharge home.  Subjective:  Doing well. No complaints  Allergies: Contrast media [iodinated diagnostic agents]  Medications: Prior to Admission medications   Medication Sig Start Date End Date Taking? Authorizing Provider  allopurinol (ZYLOPRIM) 100 MG tablet Take 100 mg by mouth at bedtime.   Yes [provider]  amLODipine (NORVASC) 10 MG tablet Take 10 mg by mouth daily.   Yes [provider]  APPLE CIDER VINEGAR PO Take 1 tablet by mouth daily.   Yes [provider]  aspirin EC 81 MG EC tablet Take 1 tablet (81 mg total) by mouth daily. Swallow whole. 11/22/19  Yes Danford, ChSuann LarryMD  atorvastatin (LIPITOR) 80 MG tablet Take 1 tablet (80 mg total) by mouth daily. 11/22/19  Yes Danford, ChSuann LarryMD  baclofen (LIORESAL) 10 MG tablet Take 10 mg by mouth 3 (three) times daily as needed for muscle spasms.   Yes [provider]  buprenorphine (BUTRANS) 20 MCG/HR PTWK Place 1 patch onto the skin every Monday.  11/15/19  Yes [provider]  clopidogrel (PLAVIX) 75 MG tablet Take 75 mg by mouth daily.   Yes [provider]  Dulaglutide (TRULICITY) 1.5 MGMG/8.6PYOPN  Inject 1.5 mg into the skin every Monday.    Yes [provider]  ezetimibe (ZETIA) 10 MG tablet Take 10 mg by mouth daily.   Yes [provider]  gabapentin (NEURONTIN) 600 MG tablet Take 600 mg by mouth 4 (four) times daily.    Yes [provider]  insulin glargine, 1 Unit Dial, (TOUJEO SOLOSTAR) 300 UNIT/ML Solostar Pen Inject 20 Units into the skin at bedtime.    Yes [provider]  Ipratropium-Albuterol (COMBIVENT) 20-100 MCG/ACT AERS respimat Inhale 1 puff into the lungs every 6 (six) hours as needed for wheezing or shortness of breath. 03/17/17  Yes Hongalgi, AnLenis DickinsonMD  levothyroxine (SYNTHROID) 75 MCG tablet Take 75 mcg by mouth daily. 02/28/19  Yes [provider]  losartan (COZAAR) 25 MG tablet Take 1 tablet (25 mg total) by mouth daily. 11/22/19 11/21/20 Yes Danford, ChSuann LarryMD  metoprolol (LOPRESSOR) 50 MG tablet Take 50 mg by mouth 2 (two) times daily.  03/06/14  Yes [provider]  Multiple Vitamin (MULTIVITAMIN WITH MINERALS) TABS tablet Take 1 tablet by mouth daily.   Yes [provider]  oxyCODONE-acetaminophen (PERCOCET) 10-325 MG tablet Take 1 tablet by mouth every 6 (six) hours as needed (breakthrough pain).    Yes [provider]  pregabalin (LYRICA) 25 MG capsule Take 25 mg by mouth 2 (two) times daily.  01/24/19  Yes [provider]  sennosides-docusate sodium (SENOKOT-S) 8.6-50 MG tablet Take 1 tablet by mouth at bedtime.   Yes [provider]  Vitamin D, Ergocalciferol, (DRISDOL) 1.25 MG (50000 UNIT) CAPS capsule  Take 50,000 Units by mouth every Monday.    Yes [provider]  XTAMPZA ER 27 MG C12A Take 27 mg by mouth 2 (two) times daily.  02/27/19  Yes [provider]  feeding supplement, ENSURE ENLIVE, (ENSURE ENLIVE) LIQD Take 237 mLs by mouth 2 (two) times daily between meals. 03/30/19   Bonnell Public, MD  furosemide (LASIX) 40 MG tablet Take 1 tablet (40  mg total) by mouth daily as needed. Patient taking differently: Take 40 mg by mouth daily as needed for fluid. 05/20/19 05/19/20  O'NealCassie Freer, MD  lidocaine-prilocaine (EMLA) cream Apply 1 application topically as needed. Patient taking differently: Apply 1 application topically daily as needed (port access). 07/03/19   Truitt Merle, MD  [facility] 4 MG/0.1ML LIQD nasal spray kit Place 1 spray into the nose as needed for opioid reversal. 08/22/19   [provider]  pantoprazole (PROTONIX) 40 MG tablet Take 40 mg by mouth every morning.    [provider]     Vital Signs: BP (!) 129/47 (BP Location: Left Arm)   Pulse (!) 54   Temp 98.4 F (36.9 C) (Oral)   Resp 14   Ht '5\' 5"'  (1.651 m)   Wt 81.8 kg   SpO2 98%   BMI 30.01 kg/m   Physical Exam Awake, alert In good spirits.  Imaging: No results found.  Labs:  CBC: Recent Labs    04/27/20 0719 05/04/20 1645 05/05/20 0436 05/08/20 1316  WBC 12.1* 14.0* 14.3* 9.4  HGB 10.1* 8.8* 7.8* 8.5*  HCT 33.6* 29.7* 27.5* 29.3*  PLT 296 229 200 246    COAGS: Recent Labs    11/19/19 1317 02/06/20 0828 04/27/20 0719  INR 1.0 1.0 1.1  APTT 26  --  29    BMP: Recent Labs    11/20/19 0433 11/22/19 1139 01/24/20 1212 02/06/20 0718 02/06/20 1124 04/27/20 0719 05/04/20 1645 05/05/20 0436 05/08/20 1316  NA 139 136 139 137   < > 138 137 140 139  K 4.2 4.2 4.6 6.4*   < > 4.9 5.0 4.4 4.4  CL 103 103 107 103   < > 105 108 109 108  CO2 '26 25 23 24  ' --  24 21* 21* 24  GLUCOSE 131* 169* 232* 410*   < > 228* 242* 106* 219*  BUN 16 12 26* 28*   < > 24* 27* 22 15  CALCIUM 9.9 9.8 10.8* 10.4*  --  10.1 8.7* 8.8* 9.9  CREATININE 1.01*  1.04* 1.03* 1.21* 1.56*   < > 1.58* 1.26* 1.12* 1.08*  GFRNONAA 57*  55* 55* 46* 34*  --  35* 46* 53* 55*  GFRAA >60  >60 >60 53* 39*  --   --   --   --   --    < > = values in this interval not displayed.    LIVER FUNCTION TESTS: Recent Labs    07/19/19 1340 11/19/19 1317  01/24/20 1212 05/04/20 1645  BILITOT 0.5 0.7 0.3 0.6  AST 77* '20 28 31  ' ALT 119* 23 56* 65*  ALKPHOS 330* 159* 237* 192*  PROT 7.4 6.9 7.2 5.3*  ALBUMIN 3.9 3.9 3.9 3.0*    Assessment and Plan:  Lt common carotid arteriogram followed by embolization of L tMCA aneurysm with a 4.54m x 485mWEB flow disrupter deviceby Dr. DeEstanislado Pandyn 05/04/20.  Awaiting inpt rehab bed.  Electronically Signed: WEMurrell ReddenPA-C 05/10/2020, 10:53 AM    I spent  a total of 15 Minutes at the the patient's bedside AND on the patient's hospital floor or unit, greater than 50% of which was counseling/coordinating care for f/u cerebral intervention.

## 2020-05-10 NOTE — Treatment Plan (Signed)
Treatment Plan  Patient has remained stable. Vital signs OK. No new concerns raised on chart check.  Neurology team to be available to assist as needed.  Perfecto Kingdom, MD

## 2020-05-11 LAB — GLUCOSE, CAPILLARY
Glucose-Capillary: 259 mg/dL — ABNORMAL HIGH (ref 70–99)
Glucose-Capillary: 205 mg/dL — ABNORMAL HIGH (ref 70–99)
Glucose-Capillary: 362 mg/dL — ABNORMAL HIGH (ref 70–99)

## 2020-05-11 MED ORDER — GABAPENTIN 100 MG PO CAPS: 200.0000 mg | ORAL_CAPSULE | Freq: Four times a day (QID) | ORAL | 3 refills | Status: DC

## 2020-05-11 MED ORDER — INSULIN ASPART 100 UNIT/ML ~~LOC~~ SOLN: 0.0000 [IU] | Freq: Three times a day (TID) | SUBCUTANEOUS | Status: DC

## 2020-05-11 MED ORDER — INSULIN ASPART 100 UNIT/ML ~~LOC~~ SOLN: 0.0000 [IU] | Freq: Every day | SUBCUTANEOUS | Status: DC

## 2020-05-11 MED ORDER — INSULIN GLARGINE 100 UNIT/ML ~~LOC~~ SOLN
20.0000 [IU] | Freq: Every day | SUBCUTANEOUS | Status: DC
  Administered 2020-05-11: 20 [IU] via SUBCUTANEOUS
  Filled 2020-05-11: qty 0.2

## 2020-05-11 NOTE — Progress Notes (Signed)
Inpatient Diabetes Program Recommendations  AACE/ADA: New Consensus Statement on Inpatient Glycemic Control (2015)  Target Ranges:  Prepandial:   less than 140 mg/dL      Peak postprandial:   less than 180 mg/dL (1-2 hours)      Critically ill patients:  140 - 180 mg/dL   Lab Results  Component Value Date   GLUCAP 362 (H) 05/11/2020   HGBA1C 4.7 (L) 05/04/2020    Review of Glycemic Control Results for Bethany [name], Bethany [name] (MRN [id number]) as of 05/11/2020 12:03  Ref. Range 05/10/2020 20:04 05/10/2020 23:26 05/11/2020 04:11 05/11/2020 07:28 05/11/2020 11:34  Glucose-Capillary Latest Ref Range: 70 - 99 mg/dL 299 (H) 242 (H) 683 (H) 205 (H) 362 (H)   Diabetes history: DM 2 Outpatient Diabetes medications:  Trulicity 1.5 weekly q Monday, Toujeo 20 units q HS Current orders for Inpatient glycemic control:  Novolog moderate q 4 hours Inpatient Diabetes Program Recommendations:    Please consider changing Novolog correction to tid with meals and HS.  Also please add Lantus 20 units daily (home dose of basal insulin). Called and discussed with Radiology PA, and orders received.   Thanks  Beryl Meager, RN, BC-ADM Inpatient Diabetes Coordinator Pager [phone] (8a-5p)

## 2020-05-11 NOTE — Progress Notes (Addendum)
Physical Therapy Treatment Patient Details Name: Bethany [name] MRN: [id number] DOB: [birth year] Today's Date: 05/11/2020    History of Present Illness 69 yo female with L MCA aneurysm s/p endovascular embolization using WEB device via right femoral approach 05/04/2020. post-operatively, pt with RUE/LE weakness and aphasia, resolving since, no acute CVA appreciated on CT. 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    PT Comments    Patient progressing well towards PT goals. No longer using RW for support however demonstrates very slow and cautious gait speed putting pt at increased risk for falls. No knee buckling noted today. Performed 5xSTS in 47.92 seconds (normative for her age is 12.6 sec) indicating decreased functional strength, balance deficits and fall risk. Pt continues to have decreased awareness of deficits/safety and how this can impact her at home. Reports she feels she is functioning at 80%. Discussed disposition options as [facility] does not have a bed available today. Pt reports she does have some support from children. If pt decides to return home, recommend neuro OPPT and intermittent supervision for safety. Will follow.   Follow Up Recommendations  Supervision - Intermittent;CIR (vs OPPT if pt decides to return home)     Equipment Recommendations  None recommended by PT    Recommendations for Other Services       Precautions / Restrictions Precautions Precautions: Fall Restrictions Weight Bearing Restrictions: No    Mobility  Bed Mobility               General bed mobility comments: up in chair  Transfers Overall transfer level: Needs assistance Equipment used: None Transfers: Sit to/from Stand Sit to Stand: Min guard         General transfer comment: Min guard for safety. Stood from chair x1, from toilet x1, from EOB x5. No assist needed. very slow to rise up.  Ambulation/Gait Ambulation/Gait assistance: Min  guard Gait Distance (Feet): 65 Feet Assistive device: None Gait Pattern/deviations: Step-to pattern;Step-through pattern;Decreased step length - right;Decreased step length - left Gait velocity: .31 ft/sec Gait velocity interpretation: <1.31 ft/sec, indicative of household ambulator General Gait Details: Very slow, shortened stepping pattern bilaterally, no knee buckling or hyperextension noted.   Stairs             Wheelchair Mobility    Modified Rankin (Stroke Patients Only) Modified Rankin (Stroke Patients Only) Pre-Morbid Rankin Score: No significant disability Modified Rankin: Moderate disability     Balance Overall balance assessment: Needs assistance Sitting-balance support: Feet supported;No upper extremity supported Sitting balance-Leahy Scale: Good     Standing balance support: During functional activity Standing balance-Leahy Scale: Fair                              Cognition Arousal/Alertness: Awake/alert Behavior During Therapy: WFL for tasks assessed/performed Overall Cognitive Status: Impaired/Different from baseline Area of Impairment: Safety/judgement;Problem solving                         Safety/Judgement: Decreased awareness of safety   Problem Solving: Slow processing General Comments: Pt continues to downplay deficits, "I don't want to take services from someone who actually may need them when I do not." Feels ~80% at baseline. Demonstrates decreased ability to generalize how her deficits may impact her functionally at home.  She is slow to process info at times.      Exercises  General Comments General comments (skin integrity, edema, etc.): Performed 5xSTS in 47.92 seconds (normative for her age is 12.6 sec) indicating decreased functional strength, balance deficits and fall risk.      Pertinent Vitals/Pain Pain Assessment: No/denies pain    Home Living                      Prior Function             PT Goals (current goals can now be found in the care plan section) Progress towards PT goals: Progressing toward goals    Frequency    Min 4X/week      PT Plan Discharge plan needs to be updated;Current plan remains appropriate    Co-evaluation              AM-PAC PT "6 Clicks" Mobility   Outcome Measure  Help needed turning from your back to your side while in a flat bed without using bedrails?: None Help needed moving from lying on your back to sitting on the side of a flat bed without using bedrails?: None Help needed moving to and from a bed to a chair (including a wheelchair)?: A Little Help needed standing up from a chair using your arms (e.g., wheelchair or bedside chair)?: A Little Help needed to walk in hospital room?: A Little Help needed climbing 3-5 steps with a railing? : A Lot 6 Click Score: 19    End of Session Equipment Utilized During Treatment: Gait belt Activity Tolerance: Patient tolerated treatment well Patient left: in bed;with call bell/phone within reach;with nursing/sitter in room (with tech in room) Nurse Communication: Mobility status PT Visit Diagnosis: Other abnormalities of gait and mobility (R26.89);Muscle weakness (generalized) (M62.81)     Time: 0926-1000 PT Time Calculation (min) (ACUTE ONLY): 34 min  Charges:  $Gait Training: 8-22 mins $Therapeutic Activity: 8-22 mins                     [name], PT, DPT Acute Rehabilitation Services Pager [phone] Office [phone]       [name] 05/11/2020, 2:00 PM

## 2020-05-11 NOTE — Plan of Care (Signed)
Problem: Education: Goal: Knowledge of General Education information will improve Description: Including pain rating scale, medication(s)/side effects and non-pharmacologic comfort measures 05/11/2020 1539 by Becky Augusta, RN Outcome: Adequate for Discharge 05/11/2020 1539 by Becky Augusta, RN Outcome: Adequate for Discharge   Problem: Health Behavior/Discharge Planning: Goal: Ability to manage health-related needs will improve 05/11/2020 1539 by Becky Augusta, RN Outcome: Adequate for Discharge 05/11/2020 1539 by Becky Augusta, RN Outcome: Adequate for Discharge   Problem: Clinical Measurements: Goal: Ability to maintain clinical measurements within normal limits will improve 05/11/2020 1539 by Becky Augusta, RN Outcome: Adequate for Discharge 05/11/2020 1539 by Becky Augusta, RN Outcome: Adequate for Discharge Goal: Will remain free from infection 05/11/2020 1539 by Becky Augusta, RN Outcome: Adequate for Discharge 05/11/2020 1539 by Becky Augusta, RN Outcome: Adequate for Discharge Goal: Diagnostic test results will improve 05/11/2020 1539 by Becky Augusta, RN Outcome: Adequate for Discharge 05/11/2020 1539 by Becky Augusta, RN Outcome: Adequate for Discharge Goal: Respiratory complications will improve 05/11/2020 1539 by Becky Augusta, RN Outcome: Adequate for Discharge 05/11/2020 1539 by Becky Augusta, RN Outcome: Adequate for Discharge Goal: Cardiovascular complication will be avoided 05/11/2020 1539 by Becky Augusta, RN Outcome: Adequate for Discharge 05/11/2020 1539 by Becky Augusta, RN Outcome: Adequate for Discharge   Problem: Activity: Goal: Risk for activity intolerance will decrease 05/11/2020 1539 by Becky Augusta, RN Outcome: Adequate for Discharge 05/11/2020 1539 by Becky Augusta, RN Outcome: Adequate for Discharge   Problem: Nutrition: Goal: Adequate nutrition will be maintained 05/11/2020 1539 by Becky Augusta, RN Outcome: Adequate for  Discharge 05/11/2020 1539 by Becky Augusta, RN Outcome: Adequate for Discharge   Problem: Coping: Goal: Level of anxiety will decrease 05/11/2020 1539 by Becky Augusta, RN Outcome: Adequate for Discharge 05/11/2020 1539 by Becky Augusta, RN Outcome: Adequate for Discharge   Problem: Elimination: Goal: Will not experience complications related to bowel motility 05/11/2020 1539 by Becky Augusta, RN Outcome: Adequate for Discharge 05/11/2020 1539 by Becky Augusta, RN Outcome: Adequate for Discharge Goal: Will not experience complications related to urinary retention 05/11/2020 1539 by Becky Augusta, RN Outcome: Adequate for Discharge 05/11/2020 1539 by Becky Augusta, RN Outcome: Adequate for Discharge   Problem: Pain Managment: Goal: General experience of comfort will improve 05/11/2020 1539 by Becky Augusta, RN Outcome: Adequate for Discharge 05/11/2020 1539 by Becky Augusta, RN Outcome: Adequate for Discharge   Problem: Safety: Goal: Ability to remain free from injury will improve 05/11/2020 1539 by Becky Augusta, RN Outcome: Adequate for Discharge 05/11/2020 1539 by Becky Augusta, RN Outcome: Adequate for Discharge   Problem: Skin Integrity: Goal: Risk for impaired skin integrity will decrease 05/11/2020 1539 by Becky Augusta, RN Outcome: Adequate for Discharge 05/11/2020 1539 by Becky Augusta, RN Outcome: Adequate for Discharge   Problem: Education: Goal: Knowledge of disease or condition will improve 05/11/2020 1539 by Becky Augusta, RN Outcome: Adequate for Discharge 05/11/2020 1539 by Becky Augusta, RN Outcome: Adequate for Discharge Goal: Knowledge of secondary prevention will improve 05/11/2020 1539 by Becky Augusta, RN Outcome: Adequate for Discharge 05/11/2020 1539 by Becky Augusta, RN Outcome: Adequate for Discharge Goal: Knowledge of patient specific risk factors addressed and post discharge goals established will improve 05/11/2020 1539 by Becky Augusta, RN Outcome: Adequate for Discharge 05/11/2020 1539 by Becky Augusta, RN Outcome: Adequate for Discharge Goal: Individualized Educational Video(s) 05/11/2020 1539 by Becky Augusta, RN Outcome: Adequate for Discharge 05/11/2020 1539 by Becky Augusta, RN Outcome: Adequate for Discharge   Problem: Coping: Goal: Will verbalize positive feelings about self 05/11/2020 1539 by Dorna Bloom,  Peng-Sian, RN Outcome: Adequate for Discharge 05/11/2020 1539 by Becky Augusta, RN Outcome: Adequate for Discharge Goal: Will identify appropriate support needs 05/11/2020 1539 by Becky Augusta, RN Outcome: Adequate for Discharge 05/11/2020 1539 by Becky Augusta, RN Outcome: Adequate for Discharge   Problem: Health Behavior/Discharge Planning: Goal: Ability to manage health-related needs will improve 05/11/2020 1539 by Becky Augusta, RN Outcome: Adequate for Discharge 05/11/2020 1539 by Becky Augusta, RN Outcome: Adequate for Discharge   Problem: Self-Care: Goal: Ability to participate in self-care as condition permits will improve 05/11/2020 1539 by Becky Augusta, RN Outcome: Adequate for Discharge 05/11/2020 1539 by Becky Augusta, RN Outcome: Adequate for Discharge Goal: Verbalization of feelings and concerns over difficulty with self-care will improve 05/11/2020 1539 by Becky Augusta, RN Outcome: Adequate for Discharge 05/11/2020 1539 by Becky Augusta, RN Outcome: Adequate for Discharge Goal: Ability to communicate needs accurately will improve 05/11/2020 1539 by Becky Augusta, RN Outcome: Adequate for Discharge 05/11/2020 1539 by Becky Augusta, RN Outcome: Adequate for Discharge   Problem: Nutrition: Goal: Risk of aspiration will decrease 05/11/2020 1539 by Becky Augusta, RN Outcome: Adequate for Discharge 05/11/2020 1539 by Becky Augusta, RN Outcome: Adequate for Discharge Goal: Dietary intake will improve 05/11/2020 1539 by Becky Augusta, RN Outcome: Adequate for  Discharge 05/11/2020 1539 by Becky Augusta, RN Outcome: Adequate for Discharge   Problem: Ischemic Stroke/TIA Tissue Perfusion: Goal: Complications of ischemic stroke/TIA will be minimized 05/11/2020 1539 by Becky Augusta, RN Outcome: Adequate for Discharge 05/11/2020 1539 by Becky Augusta, RN Outcome: Adequate for Discharge

## 2020-05-11 NOTE — Discharge Summary (Signed)
Patient ID: Bethany [name] MRN: [id number] DOB/AGE: [birth year] 70 y.o.  Admit date: 05/04/2020 Discharge date: 05/11/2020  Supervising Physician: Luanne Bras  Patient Status: [facility] - In-pt  Admission Diagnoses: Brain aneurysm  Discharge Diagnoses:  Active Problems:   Brain aneurysm   Discharged Condition: stable  Hospital Course:  Patient presented to [facility] 05/04/2020 for an image-guided cerebral arteriogram with embolization of left MCA aneurysm using WEB device via right femoral approach by Dr. Estanislado Pandy. Procedure occurred without major complications and patient was transferred to PACU following procedure. In PACU, patient developed transient speech difficulty (change from baseline)- a code stroke was initiated and neurology saw patient. CT head negative and she was transferred to neuro ICU for overnight observation. No major events occurred overnight.  The next morning (05/05/2020), patient's speech difficulty had resolved (back to baseline). EEG negative for seizure activity. Patient did complain of dysuria and tenderness to right femoral puncture site. UA unremarkable, VAS [place] right femoral negative for pseudoaneurysm. Also complaining of chronic left neck pain- stable as prior to procedure. PT/OT came to evaluate patient who felt patient would benefit from CIR- they continued to evaluate her throughout hospitalization.  On 05/06/2020, PT/OT still felt patient required additional assistance and recommended CIR. PMR MD came and met with patient that day, however patient resistant for CIR and requested further evaluations prior to admission. In addition, pharmacy recommended decreasing Neurontin dose given Cr and risk for decreased cognitive/respiratory status.  On 05/07/2020, again PT/OT still felt patient required additional assistance and recommended CIR. However, given upcoming holiday unable to authorize patient's insurance until after the weekend. She was transferred from  neuro ICU to neuro progressive floor.  Over holiday weekend (05/08/2020 to 05/10/2020), patient continued to work with PT/OT daily, and continued to improve daily (increased ambulation tolerance, no longer using RW for support).  Today, patient awake and alert sitting in chair. Appears more alert, speech improved (no longer slow). Patient states that she "feels stronger than last week" and her "neck pain has improved". PT evaluated patient again today stating there are no beds in CIR, and states if patient requests discharge she will require intermittent supervision with OP PT. Discussed new recommendations/options with patient- she states that she has people to check on her at home (states her "daughter lives across the hall") and would like to go home today with OP PT rather than wait for a bed at CIR. Plan to discharge home today and follow-up with Dr. Estanislado Pandy in clinic 2 weeks after discharge. Recommend patient follow-up with pain management within 1 week of discharge, along with OP PT.   Consults: neurology and rehabilitation medicine  Significant Diagnostic Studies: MR BRAIN WO CONTRAST  Result Date: 05/05/2020 CLINICAL DATA:  Initial evaluation for speech abnormality following recent aneurysm repair. EXAM: MRI HEAD WITHOUT CONTRAST TECHNIQUE: Multiplanar, multiecho pulse sequences of the brain and surrounding structures were obtained without intravenous contrast. COMPARISON:  Previous CT from 05/04/2020 and MRI from 11/19/2019. FINDINGS: Brain: Cerebral volume within normal limits for age. Mild scattered chronic microvascular ischemic changes noted involving the supratentorial cerebral white matter and pons. Involving encephalomalacia of with laminar necrosis seen at the posterior left temporal region related to the previously identified infarct. Associated susceptibility artifact related to laminar necrosis and/or chronic hemosiderin staining. No other abnormal foci of restricted diffusion to  suggest acute or subacute ischemia. Gray-white matter differentiation otherwise maintained. No other evidence for acute or chronic intracranial hemorrhage. No mass lesion, midline shift or mass effect. No hydrocephalus  or extra-axial fluid collection. Pituitary gland suprasellar region normal. Midline structures intact. Vascular: Major intracranial vascular flow voids are maintained. Skull and upper cervical spine: Craniocervical junction within normal limits. Diffusely decreased T1 signal intensity seen throughout the visualized bone marrow, nonspecific, but most commonly related to anemia, smoking, or obesity. No scalp soft tissue abnormality. Sinuses/Orbits: Patient status post bilateral ocular lens replacement. Globes and orbital soft tissues demonstrate no acute finding. Paranasal sinuses are largely clear. Right mastoid effusion noted. Visualized nasopharynx within normal limits. Other: None. IMPRESSION: 1. No acute intracranial infarct or other abnormality. 2. Evolving encephalomalacia with laminar necrosis involving the posterior left temporal region related to previously identified left MCA territory infarct. 3. Underlying mild chronic microvascular ischemic disease. 4. Right mastoid effusion. Electronically Signed   By: Jeannine Boga M.D.   On: 05/05/2020 01:36   IR Transcath/Emboliz  Result Date: 05/06/2020 CLINICAL DATA:  Patient with headaches and multiple intracranial aneurysms. Patient admitted for unruptured left middle cerebral artery aneurysm. EXAM: TRANSCATHETER THERAPY EMBOLIZATION COMPARISON:  Diagnostic catheter arteriogram of February 06, 2020. MEDICATIONS: Heparin 3,000 units IV. Ancef 2 g IV antibiotic was administered within 1 hour. ANESTHESIA/SEDATION: General anesthesia CONTRAST:  Isovue 300 approximately 120 mL FLUOROSCOPY TIME:  Fluoroscopy Time: 35 minutes 18 seconds (1457 mGy). COMPLICATIONS: None immediate. TECHNIQUE: Informed written consent was obtained from the  patient after a thorough discussion of the procedural risks, benefits and alternatives. All questions were addressed. Maximal Sterile Barrier Technique was utilized including caps, mask, sterile gowns, sterile gloves, sterile drape, hand hygiene and skin antiseptic. A timeout was performed prior to the initiation of the procedure. Initially a radial access was obtained with ultrasound guidance after having documented the right radial artery morphology with ultrasound. However, due to persistent vaso spasm induced by the micro guidewire, this approach was abandoned. The right groin was prepped and draped in the usual sterile fashion. Thereafter using modified Seldinger technique, transfemoral access into the right common femoral artery was obtained without difficulty. Over a 0.035 inch guidewire, an 8 [place] 25 cm Pinnacle sheath was inserted. Through this, and also over 0.035 inch guidewire, a 5 [place] JB 1 catheter was advanced to the aortic arch region and selectively positioned in the left common carotid artery. FINDINGS:.: FINDINGS:. The left common carotid arteriogram demonstrates the left external carotid artery and its major branches to be widely patent. The left internal carotid artery at the bulb to the cranial skull base is also widely patent. Patency is maintained into the petrous, cavernous and supraclinoid segments. Again demonstrated are 2 small saccular outpouchings projecting posteriorly from the supraclinoid left ICA, one in the region of the origin of the anterior choroidal artery. The left middle cerebral artery and the left anterior cerebral artery opacify into the capillary and venous phases. Prompt filling via the anterior communicating artery of the right anterior cerebral A2 segment is seen. Arising at the bifurcation of the left middle cerebral artery again noted is a saccular aneurysm with a relatively wide neck projecting slightly anteriorly. A 3D rotational arteriogram was then  performed from the left common carotid artery extending intracranially. Reconstruction was then performed on a separate workstation. Optimal views were then obtained for treatment of the aneurysm. The aneurysm measured to be approximately 4.1 mm in its maximum width, and 3.82 mm in height. It was decided to use a WEB SL W5-4.3 for intra saccular disruption device for embolization. ENDOVASCULAR EMBOLIZATION OF LEFT MIDDLE CEREBRAL ARTERY BIFURCATION REGION ANEURYSM The diagnostic JB 1 catheter  in the left common carotid artery was then exchanged over a 0.035 inch 300 cm Rosen exchange guidewire for a 90 cm 6 [place] Infinity sheath. This was advanced to the bifurcation of the left common carotid artery. The guidewire was removed. Good aspiration obtained from the hub of the Infinity sheath. A gentle control arteriogram performed through this demonstrated no evidence of vasospasm or of intraluminal filling defects. This was then connected to continuous heparinized saline infusion. Through the Infinity sheath, a combination of a 6 [place] 115 cm intermediary catheter inside of which was a Via 17 microcatheter was advanced over a 0.014 inch standard Synchro micro guidewire with a J configuration to the proximal cavernous segment. At this time, the Infinity guide sheath was advanced to the junction of the cervical petrous junction. Using biplane roadmap technique and constant fluoroscopic guidance, in a coaxial manner and with constant heparinized saline infusion, the micro guidewire was then gently advanced with a J-tip configuration to the left middle cerebral artery followed by the microcatheter. The micro guidewire was then gently advanced into the aneurysm followed by the microcatheter. The guidewire was removed. Good aspiration obtained from the hub of the microcatheter. This was then connected to continuous heparinized saline infusion. The WEB SL W5-4.3 intra saccular flow disrupter was then prepped and purged  in heparinized saline. It was cleared of any air bubbles. This was then recaptured into the housing catheter. This was then loaded onto the microcatheter after heparin flushed in the Tuohy Borst. Smooth advancement was obtained to the middle cerebral artery very slowly ensuring no forward motion or movements of the microcatheter which was just inside the neck of the aneurysm. The distal marker on the WEB device was advanced to the distal marker on the microcatheter. The entire system was then straightened. With the microcatheter stable, the WEB device was advanced until early formation of a [place] configuration. A control arteriogram performed through the [facility] guide catheter demonstrated safe position and orientation of the device within the aneurysm. The microcatheter was then gently advanced further into the aneurysm followed by advancement of the WEB device into the aneurysm. Complete opening of the device was noted. A control arteriogram performed through the [facility] guide catheter demonstrated complete opening and apposition of the device into the aneurysm. The superior and inferior branches of the left middle cerebral artery were patent without evidence of encroachment on the bifurcation branches. With the WEB device well oriented in the AP and lateral projections, the microcatheter was gently withdrawn to just proximal to the proximal marker on the WEB device. Again the orientation of the WEB device within the aneurysm remained unchanged. A control arteriogram performed through the [facility] guide catheter demonstrated excellent apposition on the AP and lateral projections with contrast stasis within the aneurysm. Under constant biplane fluoroscopy, the device was disconnected and deployed using the detachment device at the proximal aspect of the delivery wire. The micro guidewire was gently advanced just distal to the microcatheter then withdrawn. A control arteriogram was then performed through the [facility] guide  catheter in the left internal carotid artery which continued to demonstrate excellent flow through the left middle cerebral artery distribution with patency of the superior and inferior divisions, and stasis within the aneurysm itself. Control arteriograms were then performed at 10 and 20 minutes post deployment of the device. These continued to demonstrated no evidence of intraluminal filling defects, or of occlusions in the left middle cerebral artery and the left anterior cerebral artery distributions. The [place]  6 [place] guide catheter was removed. The Infinity sheath was then retrieved to just proximal to the left common carotid artery bifurcation. Control arteriogram performed through this continued to demonstrate wide patency of the of the left internal carotid artery proximally and distally with no change in the intra saccular device within left middle cerebral artery aneurysm. The Infinity sheath was removed. The 8 [place] Pinnacle sheath was then removed with the successful application of an 8 [place] Angio-Seal closure device for hemostasis. Distal pulses remained palpable in both feet unchanged. [name] ACT was in the region of approximately 250. This was partially reversed with 5 mg of protamine sulfate. [name] general anesthesia was slowly reversed. She was able to follow simple commands following the extubation. She was transferred to PACU for post extubation management. 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 PLAN: Patient to be left on aspirin 81 mg a day and Plavix 75 mg per day for 2 weeks. Follow-up in the clinic 2 weeks post discharge. Electronically Signed   By: Luanne Bras M.D.   On: 05/05/2020 09:52   IR Angiogram Follow Up Study  Result Date: 05/06/2020 CLINICAL DATA:  Patient with headaches and multiple intracranial aneurysms. Patient admitted for unruptured  left middle cerebral artery aneurysm. EXAM: TRANSCATHETER THERAPY EMBOLIZATION COMPARISON:  Diagnostic catheter arteriogram of February 06, 2020. MEDICATIONS: Heparin 3,000 units IV. Ancef 2 g IV antibiotic was administered within 1 hour. ANESTHESIA/SEDATION: General anesthesia CONTRAST:  Isovue 300 approximately 120 mL FLUOROSCOPY TIME:  Fluoroscopy Time: 35 minutes 18 seconds (1457 mGy). COMPLICATIONS: None immediate. TECHNIQUE: Informed written consent was obtained from the patient after a thorough discussion of the procedural risks, benefits and alternatives. All questions were addressed. Maximal Sterile Barrier Technique was utilized including caps, mask, sterile gowns, sterile gloves, sterile drape, hand hygiene and skin antiseptic. A timeout was performed prior to the initiation of the procedure. Initially a radial access was obtained with ultrasound guidance after having documented the right radial artery morphology with ultrasound. However, due to persistent vaso spasm induced by the micro guidewire, this approach was abandoned. The right groin was prepped and draped in the usual sterile fashion. Thereafter using modified Seldinger technique, transfemoral access into the right common femoral artery was obtained without difficulty. Over a 0.035 inch guidewire, an 8 [place] 25 cm Pinnacle sheath was inserted. Through this, and also over 0.035 inch guidewire, a 5 [place] JB 1 catheter was advanced to the aortic arch region and selectively positioned in the left common carotid artery. FINDINGS:.: FINDINGS:. The left common carotid arteriogram demonstrates the left external carotid artery and its major branches to be widely patent. The left internal carotid artery at the bulb to the cranial skull base is also widely patent. Patency is maintained into the petrous, cavernous and supraclinoid segments. Again demonstrated are 2 small saccular outpouchings projecting posteriorly from the supraclinoid left ICA,  one in the region of the origin of the anterior choroidal artery. The left middle cerebral artery and the left anterior cerebral artery opacify into the capillary and venous phases. Prompt filling via the anterior communicating artery of the right anterior cerebral A2 segment is seen. Arising at the bifurcation of the left middle cerebral artery again noted is a saccular aneurysm with a relatively wide neck projecting slightly anteriorly. A 3D rotational arteriogram was then performed from the left common carotid artery extending intracranially. Reconstruction was then performed on a  separate workstation. Optimal views were then obtained for treatment of the aneurysm. The aneurysm measured to be approximately 4.1 mm in its maximum width, and 3.82 mm in height. It was decided to use a WEB SL W5-4.3 for intra saccular disruption device for embolization. ENDOVASCULAR EMBOLIZATION OF LEFT MIDDLE CEREBRAL ARTERY BIFURCATION REGION ANEURYSM The diagnostic JB 1 catheter in the left common carotid artery was then exchanged over a 0.035 inch 300 cm Rosen exchange guidewire for a 90 cm 6 [place] Infinity sheath. This was advanced to the bifurcation of the left common carotid artery. The guidewire was removed. Good aspiration obtained from the hub of the Infinity sheath. A gentle control arteriogram performed through this demonstrated no evidence of vasospasm or of intraluminal filling defects. This was then connected to continuous heparinized saline infusion. Through the Infinity sheath, a combination of a 6 [place] 115 cm intermediary catheter inside of which was a Via 17 microcatheter was advanced over a 0.014 inch standard Synchro micro guidewire with a J configuration to the proximal cavernous segment. At this time, the Infinity guide sheath was advanced to the junction of the cervical petrous junction. Using biplane roadmap technique and constant fluoroscopic guidance, in a coaxial manner and with constant  heparinized saline infusion, the micro guidewire was then gently advanced with a J-tip configuration to the left middle cerebral artery followed by the microcatheter. The micro guidewire was then gently advanced into the aneurysm followed by the microcatheter. The guidewire was removed. Good aspiration obtained from the hub of the microcatheter. This was then connected to continuous heparinized saline infusion. The WEB SL W5-4.3 intra saccular flow disrupter was then prepped and purged in heparinized saline. It was cleared of any air bubbles. This was then recaptured into the housing catheter. This was then loaded onto the microcatheter after heparin flushed in the Tuohy Borst. Smooth advancement was obtained to the middle cerebral artery very slowly ensuring no forward motion or movements of the microcatheter which was just inside the neck of the aneurysm. The distal marker on the WEB device was advanced to the distal marker on the microcatheter. The entire system was then straightened. With the microcatheter stable, the WEB device was advanced until early formation of a [place] configuration. A control arteriogram performed through the [facility] guide catheter demonstrated safe position and orientation of the device within the aneurysm. The microcatheter was then gently advanced further into the aneurysm followed by advancement of the WEB device into the aneurysm. Complete opening of the device was noted. A control arteriogram performed through the [facility] guide catheter demonstrated complete opening and apposition of the device into the aneurysm. The superior and inferior branches of the left middle cerebral artery were patent without evidence of encroachment on the bifurcation branches. With the WEB device well oriented in the AP and lateral projections, the microcatheter was gently withdrawn to just proximal to the proximal marker on the WEB device. Again the orientation of the WEB device within the aneurysm remained  unchanged. A control arteriogram performed through the [facility] guide catheter demonstrated excellent apposition on the AP and lateral projections with contrast stasis within the aneurysm. Under constant biplane fluoroscopy, the device was disconnected and deployed using the detachment device at the proximal aspect of the delivery wire. The micro guidewire was gently advanced just distal to the microcatheter then withdrawn. A control arteriogram was then performed through the [facility] guide catheter in the left internal carotid artery which continued to demonstrate excellent flow through the left middle  cerebral artery distribution with patency of the superior and inferior divisions, and stasis within the aneurysm itself. Control arteriograms were then performed at 10 and 20 minutes post deployment of the device. These continued to demonstrated no evidence of intraluminal filling defects, or of occlusions in the left middle cerebral artery and the left anterior cerebral artery distributions. The [place] 6 [place] guide catheter was removed. The Infinity sheath was then retrieved to just proximal to the left common carotid artery bifurcation. Control arteriogram performed through this continued to demonstrate wide patency of the of the left internal carotid artery proximally and distally with no change in the intra saccular device within left middle cerebral artery aneurysm. The Infinity sheath was removed. The 8 French Pinnacle sheath was then removed with the successful application of an 8 [place] Angio-Seal closure device for hemostasis. Distal pulses remained palpable in both feet unchanged. [name] ACT was in the region of approximately 250. This was partially reversed with 5 mg of protamine sulfate. [name] general anesthesia was slowly reversed. She was able to follow simple commands following the extubation. She was transferred to PACU for post extubation management. IMPRESSION: Status post endovascular  embolization of wide neck left middle cerebral artery aneurysm using the WEB intra saccular flow disruption device. PLAN: Patient to be left on aspirin 81 mg a day and Plavix 75 mg per day for 2 weeks. Follow-up in the clinic 2 weeks post discharge. Electronically Signed   By: Luanne Bras M.D.   On: 05/05/2020 09:52   IR 3D Independent Darreld Mclean  Result Date: 05/06/2020 CLINICAL DATA:  Patient with headaches and multiple intracranial aneurysms. Patient admitted for unruptured left middle cerebral artery aneurysm. EXAM: TRANSCATHETER THERAPY EMBOLIZATION COMPARISON:  Diagnostic catheter arteriogram of February 06, 2020. MEDICATIONS: Heparin 3,000 units IV. Ancef 2 g IV antibiotic was administered within 1 hour. ANESTHESIA/SEDATION: General anesthesia CONTRAST:  Isovue 300 approximately 120 mL FLUOROSCOPY TIME:  Fluoroscopy Time: 35 minutes 18 seconds (1457 mGy). COMPLICATIONS: None immediate. TECHNIQUE: Informed written consent was obtained from the patient after a thorough discussion of the procedural risks, benefits and alternatives. All questions were addressed. Maximal Sterile Barrier Technique was utilized including caps, mask, sterile gowns, sterile gloves, sterile drape, hand hygiene and skin antiseptic. A timeout was performed prior to the initiation of the procedure. Initially a radial access was obtained with ultrasound guidance after having documented the right radial artery morphology with ultrasound. However, due to persistent vaso spasm induced by the micro guidewire, this approach was abandoned. The right groin was prepped and draped in the usual sterile fashion. Thereafter using modified Seldinger technique, transfemoral access into the right common femoral artery was obtained without difficulty. Over a 0.035 inch guidewire, an 8 [place] 25 cm Pinnacle sheath was inserted. Through this, and also over 0.035 inch guidewire, a 5 [place] JB 1 catheter was advanced to the aortic arch region  and selectively positioned in the left common carotid artery. FINDINGS:.: FINDINGS:. The left common carotid arteriogram demonstrates the left external carotid artery and its major branches to be widely patent. The left internal carotid artery at the bulb to the cranial skull base is also widely patent. Patency is maintained into the petrous, cavernous and supraclinoid segments. Again demonstrated are 2 small saccular outpouchings projecting posteriorly from the supraclinoid left ICA, one in the region of the origin of the anterior choroidal artery. The left middle cerebral artery and the left anterior cerebral artery opacify into the capillary and venous phases. Prompt filling  via the anterior communicating artery of the right anterior cerebral A2 segment is seen. Arising at the bifurcation of the left middle cerebral artery again noted is a saccular aneurysm with a relatively wide neck projecting slightly anteriorly. A 3D rotational arteriogram was then performed from the left common carotid artery extending intracranially. Reconstruction was then performed on a separate workstation. Optimal views were then obtained for treatment of the aneurysm. The aneurysm measured to be approximately 4.1 mm in its maximum width, and 3.82 mm in height. It was decided to use a WEB SL W5-4.3 for intra saccular disruption device for embolization. ENDOVASCULAR EMBOLIZATION OF LEFT MIDDLE CEREBRAL ARTERY BIFURCATION REGION ANEURYSM The diagnostic JB 1 catheter in the left common carotid artery was then exchanged over a 0.035 inch 300 cm Rosen exchange guidewire for a 90 cm 6 [place] Infinity sheath. This was advanced to the bifurcation of the left common carotid artery. The guidewire was removed. Good aspiration obtained from the hub of the Infinity sheath. A gentle control arteriogram performed through this demonstrated no evidence of vasospasm or of intraluminal filling defects. This was then connected to continuous heparinized  saline infusion. Through the Infinity sheath, a combination of a 6 [place] 115 cm intermediary catheter inside of which was a Via 17 microcatheter was advanced over a 0.014 inch standard Synchro micro guidewire with a J configuration to the proximal cavernous segment. At this time, the Infinity guide sheath was advanced to the junction of the cervical petrous junction. Using biplane roadmap technique and constant fluoroscopic guidance, in a coaxial manner and with constant heparinized saline infusion, the micro guidewire was then gently advanced with a J-tip configuration to the left middle cerebral artery followed by the microcatheter. The micro guidewire was then gently advanced into the aneurysm followed by the microcatheter. The guidewire was removed. Good aspiration obtained from the hub of the microcatheter. This was then connected to continuous heparinized saline infusion. The WEB SL W5-4.3 intra saccular flow disrupter was then prepped and purged in heparinized saline. It was cleared of any air bubbles. This was then recaptured into the housing catheter. This was then loaded onto the microcatheter after heparin flushed in the Tuohy Borst. Smooth advancement was obtained to the middle cerebral artery very slowly ensuring no forward motion or movements of the microcatheter which was just inside the neck of the aneurysm. The distal marker on the WEB device was advanced to the distal marker on the microcatheter. The entire system was then straightened. With the microcatheter stable, the WEB device was advanced until early formation of a Tulip configuration. A control arteriogram performed through the [facility] guide catheter demonstrated safe position and orientation of the device within the aneurysm. The microcatheter was then gently advanced further into the aneurysm followed by advancement of the WEB device into the aneurysm. Complete opening of the device was noted. A control arteriogram performed through  the [facility] guide catheter demonstrated complete opening and apposition of the device into the aneurysm. The superior and inferior branches of the left middle cerebral artery were patent without evidence of encroachment on the bifurcation branches. With the WEB device well oriented in the AP and lateral projections, the microcatheter was gently withdrawn to just proximal to the proximal marker on the WEB device. Again the orientation of the WEB device within the aneurysm remained unchanged. A control arteriogram performed through the [facility] guide catheter demonstrated excellent apposition on the AP and lateral projections with contrast stasis within the aneurysm. Under constant biplane  fluoroscopy, the device was disconnected and deployed using the detachment device at the proximal aspect of the delivery wire. The micro guidewire was gently advanced just distal to the microcatheter then withdrawn. A control arteriogram was then performed through the [facility] guide catheter in the left internal carotid artery which continued to demonstrate excellent flow through the left middle cerebral artery distribution with patency of the superior and inferior divisions, and stasis within the aneurysm itself. Control arteriograms were then performed at 10 and 20 minutes post deployment of the device. These continued to demonstrated no evidence of intraluminal filling defects, or of occlusions in the left middle cerebral artery and the left anterior cerebral artery distributions. The [place] 6 [place] guide catheter was removed. The Infinity sheath was then retrieved to just proximal to the left common carotid artery bifurcation. Control arteriogram performed through this continued to demonstrate wide patency of the of the left internal carotid artery proximally and distally with no change in the intra saccular device within left middle cerebral artery aneurysm. The Infinity sheath was removed. The 8 French Pinnacle sheath was then removed  with the successful application of an 8 French Angio-Seal closure device for hemostasis. Distal pulses remained palpable in both feet unchanged. [name] ACT was in the region of approximately 250. This was partially reversed with 5 mg of protamine sulfate. [name] general anesthesia was slowly reversed. She was able to follow simple commands following the extubation. She was transferred to PACU for post extubation management. 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 PLAN: Patient to be left on aspirin 81 mg a day and Plavix 75 mg per day for 2 weeks. Follow-up in the clinic 2 weeks post discharge. Electronically Signed   By: Luanne Bras M.D.   On: 05/05/2020 09:52   IR US Guide Vasc Access Right  Result Date: 05/06/2020 CLINICAL DATA:  Patient with headaches and multiple intracranial aneurysms. Patient admitted for unruptured left middle cerebral artery aneurysm. EXAM: TRANSCATHETER THERAPY EMBOLIZATION COMPARISON:  Diagnostic catheter arteriogram of February 06, 2020. MEDICATIONS: Heparin 3,000 units IV. Ancef 2 g IV antibiotic was administered within 1 hour. ANESTHESIA/SEDATION: General anesthesia CONTRAST:  Isovue 300 approximately 120 mL FLUOROSCOPY TIME:  Fluoroscopy Time: 35 minutes 18 seconds (1457 mGy). COMPLICATIONS: None immediate. TECHNIQUE: Informed written consent was obtained from the patient after a thorough discussion of the procedural risks, benefits and alternatives. All questions were addressed. Maximal Sterile Barrier Technique was utilized including caps, mask, sterile gowns, sterile gloves, sterile drape, hand hygiene and skin antiseptic. A timeout was performed prior to the initiation of the procedure. Initially a radial access was obtained with ultrasound guidance after having documented the right radial artery morphology with ultrasound. However, due to  persistent vaso spasm induced by the micro guidewire, this approach was abandoned. The right groin was prepped and draped in the usual sterile fashion. Thereafter using modified Seldinger technique, transfemoral access into the right common femoral artery was obtained without difficulty. Over a 0.035 inch guidewire, an 8 [place] 25 cm Pinnacle sheath was inserted. Through this, and also over 0.035 inch guidewire, a 5 [place] JB 1 catheter was advanced to the aortic arch region and selectively positioned in the left common carotid artery. FINDINGS:.: FINDINGS:. The left common carotid arteriogram demonstrates the left external carotid artery and its major branches to be widely patent. The left internal carotid artery at the bulb to the cranial skull  base is also widely patent. Patency is maintained into the petrous, cavernous and supraclinoid segments. Again demonstrated are 2 small saccular outpouchings projecting posteriorly from the supraclinoid left ICA, one in the region of the origin of the anterior choroidal artery. The left middle cerebral artery and the left anterior cerebral artery opacify into the capillary and venous phases. Prompt filling via the anterior communicating artery of the right anterior cerebral A2 segment is seen. Arising at the bifurcation of the left middle cerebral artery again noted is a saccular aneurysm with a relatively wide neck projecting slightly anteriorly. A 3D rotational arteriogram was then performed from the left common carotid artery extending intracranially. Reconstruction was then performed on a separate workstation. Optimal views were then obtained for treatment of the aneurysm. The aneurysm measured to be approximately 4.1 mm in its maximum width, and 3.82 mm in height. It was decided to use a WEB SL W5-4.3 for intra saccular disruption device for embolization. ENDOVASCULAR EMBOLIZATION OF LEFT MIDDLE CEREBRAL ARTERY BIFURCATION REGION ANEURYSM The diagnostic JB 1  catheter in the left common carotid artery was then exchanged over a 0.035 inch 300 cm Rosen exchange guidewire for a 90 cm 6 [place] Infinity sheath. This was advanced to the bifurcation of the left common carotid artery. The guidewire was removed. Good aspiration obtained from the hub of the Infinity sheath. A gentle control arteriogram performed through this demonstrated no evidence of vasospasm or of intraluminal filling defects. This was then connected to continuous heparinized saline infusion. Through the Infinity sheath, a combination of a 6 [place] 115 cm intermediary catheter inside of which was a Via 17 microcatheter was advanced over a 0.014 inch standard Synchro micro guidewire with a J configuration to the proximal cavernous segment. At this time, the Infinity guide sheath was advanced to the junction of the cervical petrous junction. Using biplane roadmap technique and constant fluoroscopic guidance, in a coaxial manner and with constant heparinized saline infusion, the micro guidewire was then gently advanced with a J-tip configuration to the left middle cerebral artery followed by the microcatheter. The micro guidewire was then gently advanced into the aneurysm followed by the microcatheter. The guidewire was removed. Good aspiration obtained from the hub of the microcatheter. This was then connected to continuous heparinized saline infusion. The WEB SL W5-4.3 intra saccular flow disrupter was then prepped and purged in heparinized saline. It was cleared of any air bubbles. This was then recaptured into the housing catheter. This was then loaded onto the microcatheter after heparin flushed in the Tuohy Borst. Smooth advancement was obtained to the middle cerebral artery very slowly ensuring no forward motion or movements of the microcatheter which was just inside the neck of the aneurysm. The distal marker on the WEB device was advanced to the distal marker on the microcatheter. The entire system  was then straightened. With the microcatheter stable, the WEB device was advanced until early formation of a Tulip configuration. A control arteriogram performed through the [facility] guide catheter demonstrated safe position and orientation of the device within the aneurysm. The microcatheter was then gently advanced further into the aneurysm followed by advancement of the WEB device into the aneurysm. Complete opening of the device was noted. A control arteriogram performed through the [facility] guide catheter demonstrated complete opening and apposition of the device into the aneurysm. The superior and inferior branches of the left middle cerebral artery were patent without evidence of encroachment on the bifurcation branches. With the [facility] device well oriented  in the AP and lateral projections, the microcatheter was gently withdrawn to just proximal to the proximal marker on the WEB device. Again the orientation of the WEB device within the aneurysm remained unchanged. A control arteriogram performed through the [facility] guide catheter demonstrated excellent apposition on the AP and lateral projections with contrast stasis within the aneurysm. Under constant biplane fluoroscopy, the device was disconnected and deployed using the detachment device at the proximal aspect of the delivery wire. The micro guidewire was gently advanced just distal to the microcatheter then withdrawn. A control arteriogram was then performed through the [facility] guide catheter in the left internal carotid artery which continued to demonstrate excellent flow through the left middle cerebral artery distribution with patency of the superior and inferior divisions, and stasis within the aneurysm itself. Control arteriograms were then performed at 10 and 20 minutes post deployment of the device. These continued to demonstrated no evidence of intraluminal filling defects, or of occlusions in the left middle cerebral artery and the left anterior cerebral  artery distributions. The [place] 6 [place] guide catheter was removed. The Infinity sheath was then retrieved to just proximal to the left common carotid artery bifurcation. Control arteriogram performed through this continued to demonstrate wide patency of the of the left internal carotid artery proximally and distally with no change in the intra saccular device within left middle cerebral artery aneurysm. The Infinity sheath was removed. The 8 French Pinnacle sheath was then removed with the successful application of an 8 French Angio-Seal closure device for hemostasis. Distal pulses remained palpable in both feet unchanged. [name] ACT was in the region of approximately 250. This was partially reversed with 5 mg of protamine sulfate. [name] general anesthesia was slowly reversed. She was able to follow simple commands following the extubation. She was transferred to PACU for post extubation management. 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 PLAN: Patient to be left on aspirin 81 mg a day and Plavix 75 mg per day for 2 weeks. Follow-up in the clinic 2 weeks post discharge. Electronically Signed   By: Luanne Bras M.D.   On: 05/05/2020 09:52   EEG adult  Result Date: 05/05/2020 Lora Havens, MD     05/05/2020  9:26 AM Patient Name: Bethany [name] MRN: [id number] Epilepsy Attending: Lora Havens Referring Physician/Provider: Dr Antony Contras Date: 05/04/2020 Duration: 25.07 mins Patient history: 69 year old lady with known prior history of left MCA infarct with mild residual aphasia and right-sided weakness with sudden onset of altered mental status following elective left middle cerebral artery bifurcation aneurysm successful coiling with exam consistent with aphasia and mild right-sided weakness and encephalopathy. EEG to evaluate for seizure Level of alertness: Awake AEDs  during EEG study: GBP, PGB Technical aspects: This EEG study was done with scalp electrodes positioned according to the 10-20 International system of electrode placement. Electrical activity was acquired at a sampling rate of '500Hz'  and reviewed with a high frequency filter of '70Hz'  and a low frequency filter of '1Hz' . EEG data were recorded continuously and digitally stored. Description: The posterior dominant rhythm consists of 8 Hz activity of moderate voltage (25-35 uV) seen predominantly in posterior head regions, symmetric and reactive to eye opening and eye closing. EEG showed continuous left temporal 3 to 6 Hz theta-delta slowing as well as intermittent generalized 3 to 6 Hz theta-delta slowing. Hyperventilation and photic stimulation were not performed.  ABNORMALITY -Continuous slow, left temporal IMPRESSION: This study is suggestive of cortical dysfunction in left temporal region secondary to underlying structural abnormality/stroke. Additionally there is mild diffuse encephalopathy, nonspecific etiology. No seizures or epileptiform discharges were seen throughout the recording. Priyanka O Yadav   VAS [place] GROIN PSEUDOANEURYSM  Result Date: 05/05/2020  ARTERIAL PSEUDOANEURYSM  Exam: Right groin Indications: Patient complains of groin pain. History: Status post left carotid arterigram follow by emblomization left tMCA aneurysm. Comparison Study: No prior. Performing Technologist: Oda Cogan RDMS, RVT  Examination Guidelines: A complete evaluation includes B-mode imaging, spectral Doppler, color Doppler, and power Doppler as needed of all accessible portions of each vessel. Bilateral testing is considered an integral part of a complete examination. Limited examinations for reoccurring indications may be performed as noted. +------------+----------+--------+------+----------+ Right DuplexPSV (cm/s)WaveformPlaqueComment(s) +------------+----------+--------+------+----------+ CFA            116                     Patent   +------------+----------+--------+------+----------+ Prox SFA       188                    Patent   +------------+----------+--------+------+----------+ Right Vein comments:Patent right common femoral vein.  Summary: No evidence of right groin pseudoaneurysm or AVF.  Diagnosing physician: Harold Barban MD Electronically signed by Harold Barban MD on 05/05/2020 at 20:06:38 PM.   --------------------------------------------------------------------------------    Final    CT HEAD CODE STROKE WO CONTRAST  Result Date: 05/04/2020 CLINICAL DATA:  Code stroke.  Stroke follow-up. EXAM: CT HEAD WITHOUT CONTRAST TECHNIQUE: Contiguous axial images were obtained from the base of the skull through the vertex without intravenous contrast. COMPARISON:  CT head and MRI from July 6, 21. FINDINGS: Brain: Evolving encephalomalacia in the lateral left temporal lobe, in the region of the previously characterized infarct. Decreased edema and mass effect. No evidence of new/acute large vascular territory infarct. No acute hemorrhage. Vascular: No hyperdense vessel identified. Post-surgical change related to left MCA aneurysm embolization with a web flow disrupter device. Skull: No acute fracture. Sinuses/Orbits: Mild paranasal sinus mucosal thickening without air-fluid levels. Unremarkable orbits. Other: No mastoid effusions. ASPECTS [facility] Stroke Program Early CT Score) total score (0-10 with 10 being normal): 10 IMPRESSION: 1. No evidence of acute intracranial abnormality. ASPECTS is 10. 2. Evolving encephalomalacia in the lateral left temporal lobe, in the region of the previously characterized infarct. 3. Post-surgical change related to left MCA aneurysm embolization with a web flow disrupter device. Code stroke imaging results were communicated on 05/04/2020 at 2:22 pm to provider Dr. Leonie Man via telephone, who verbally acknowledged these results. Electronically Signed   By: Margaretha Sheffield MD    On: 05/04/2020 14:28   CT ANGIO HEAD CODE STROKE  Result Date: 05/04/2020 CLINICAL DATA:  Stroke suspected. EXAM: CT ANGIOGRAPHY HEAD AND NECK TECHNIQUE: Multidetector CT imaging of the head and neck was performed using the standard protocol during bolus administration of intravenous contrast. Multiplanar CT image reconstructions and MIPs were obtained to evaluate the vascular anatomy. Carotid stenosis measurements (when applicable) are obtained utilizing NASCET criteria, using the distal internal carotid diameter as the denominator. CONTRAST:  31m OMNIPAQUE IOHEXOL 350 MG/ML SOLN COMPARISON:  MRA November 20, 2019. CT chest June 15 21. FINDINGS: CTA NECK FINDINGS Aortic arch: Great vessel origins are patent. Right carotid system: No evidence of dissection, stenosis (50% or greater) or occlusion. Atherosclerosis at the bifurcation. Left carotid system: No evidence of dissection, stenosis (  50% or greater) or occlusion. Atherosclerosis at the bifurcation. Vertebral arteries: Codominant. No evidence of dissection, stenosis (50% or greater) or occlusion. Skeleton: No acute findings. Other neck: No mass or suspicious adenopathy. Upper chest: Small bilateral pleural effusions. Patchy opacities in the lung apices may represent aspiration and/or pneumonia. Review of the MIP images confirms the above findings CTA HEAD FINDINGS Anterior circulation: No significant proximal stenosis or large vessel occlusion. Status post treatment of a left MCA aneurysm with web flow disrupted device. There is some expected flow within the aneurysm sac. Approximately 2 mm left posterior communicating artery aneurysm (see series 7, image 109). Distal left M2/M3 MCA branch stenosis was better characterized on the prior MRA. Posterior circulation: No significant stenosis, proximal occlusion, aneurysm, or vascular malformation. Mild stenosis of the basilar artery. Venous sinuses: As permitted by contrast timing, patent. Review of the MIP images  confirms the above findings IMPRESSION: 1. No large vessel occlusion or new hemodynamically significant proximal stenosis in the head or neck. 2. Status post treatment of a left MCA aneurysm with web flow disrupted device. There is some expected flow within the aneurysm sac. 3. 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 4. Small bilateral pleural effusions. Patchy opacities in the lung apices may represent aspiration and/or pneumonia. Additionally, there appears to be a new 1 cm subpleural nodular opacity in the imaged lateral left upper lobe that is new since CT chest from October 29, 2019. While this most likely represents an infectious/inflammatory process, recommend follow-chest CT when the patient is able to exclude pulmonary nodule. Critical findings discussed with Dr. Marjory Lies at 2:24 PM and Dr. Leonie Man at 2:45 PM via telephone. Lung findings discussed with Dr. Leonie Man at 3:00 PM. Electronically Signed   By: Margaretha Sheffield MD   On: 05/04/2020 15:05   CT ANGIO NECK CODE STROKE  Result Date: 05/04/2020 CLINICAL DATA:  Stroke suspected. EXAM: CT ANGIOGRAPHY HEAD AND NECK TECHNIQUE: Multidetector CT imaging of the head and neck was performed using the standard protocol during bolus administration of intravenous contrast. Multiplanar CT image reconstructions and MIPs were obtained to evaluate the vascular anatomy. Carotid stenosis measurements (when applicable) are obtained utilizing NASCET criteria, using the distal internal carotid diameter as the denominator. CONTRAST:  87m OMNIPAQUE IOHEXOL 350 MG/ML SOLN COMPARISON:  MRA November 20, 2019. CT chest June 15 21. FINDINGS: CTA NECK FINDINGS Aortic arch: Great vessel origins are patent. Right carotid system: No evidence of dissection, stenosis (50% or greater) or occlusion. Atherosclerosis at the bifurcation. Left carotid system: No evidence of dissection,  stenosis (50% or greater) or occlusion. Atherosclerosis at the bifurcation. Vertebral arteries: Codominant. No evidence of dissection, stenosis (50% or greater) or occlusion. Skeleton: No acute findings. Other neck: No mass or suspicious adenopathy. Upper chest: Small bilateral pleural effusions. Patchy opacities in the lung apices may represent aspiration and/or pneumonia. Review of the MIP images confirms the above findings CTA HEAD FINDINGS Anterior circulation: No significant proximal stenosis or large vessel occlusion. Status post treatment of a left MCA aneurysm with web flow disrupted device. There is some expected flow within the aneurysm sac. Approximately 2 mm left posterior communicating artery aneurysm (see series 7, image 109). Distal left M2/M3 MCA branch stenosis was better characterized on the prior MRA. Posterior circulation: No significant stenosis, proximal occlusion, aneurysm, or vascular malformation. Mild stenosis of the basilar artery. Venous sinuses: As permitted by contrast timing, patent. Review of the MIP images  confirms the above findings IMPRESSION: 1. No large vessel occlusion or new hemodynamically significant proximal stenosis in the head or neck. 2. Status post treatment of a left MCA aneurysm with web flow disrupted device. There is some expected flow within the aneurysm sac. 3. 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 4. Small bilateral pleural effusions. Patchy opacities in the lung apices may represent aspiration and/or pneumonia. Additionally, there appears to be a new 1 cm subpleural nodular opacity in the imaged lateral left upper lobe that is new since CT chest from October 29, 2019. While this most likely represents an infectious/inflammatory process, recommend follow-chest CT when the patient is able to exclude pulmonary nodule. Critical findings discussed with Dr.  Marjory Lies at 2:24 PM and Dr. Leonie Man at 2:45 PM via telephone. Lung findings discussed with Dr. Leonie Man at 3:00 PM. Electronically Signed   By: Margaretha Sheffield MD   On: 05/04/2020 15:05   IR ANGIO INTRA EXTRACRAN SEL INTERNAL CAROTID UNI L MOD SED  Result Date: 05/07/2020 CLINICAL DATA:  Patient with headaches and multiple intracranial aneurysms. Patient admitted for unruptured left middle cerebral artery aneurysm.  EXAM: TRANSCATHETER THERAPY EMBOLIZATION  COMPARISON:  Diagnostic catheter arteriogram of February 06, 2020.  MEDICATIONS: Heparin 3,000 units IV. Ancef 2 g IV antibiotic was administered within 1 hour.  ANESTHESIA/SEDATION: General anesthesia  CONTRAST:  Isovue 300 approximately 120 mL  FLUOROSCOPY TIME:  Fluoroscopy Time: 35 minutes 18 seconds (1457 mGy).  COMPLICATIONS: None immediate.  TECHNIQUE: Informed written consent was obtained from the patient after a thorough discussion of the procedural risks, benefits and alternatives. All questions were addressed. Maximal Sterile Barrier Technique was utilized including caps, mask, sterile gowns, sterile gloves, sterile drape, hand hygiene and skin antiseptic. A timeout was performed prior to the initiation of the procedure.  Initially a radial access was obtained with ultrasound guidance after having documented the right radial artery morphology with ultrasound. However, due to persistent vaso spasm induced by the micro guidewire, this approach was abandoned.  The right groin was prepped and draped in the usual sterile fashion. Thereafter using modified Seldinger technique, transfemoral access into the right common femoral artery was obtained without difficulty. Over a 0.035 inch guidewire, an 8 [place] 25 cm Pinnacle sheath was inserted. Through this, and also over 0.035 inch guidewire, a 5 [place] JB 1 catheter was advanced to the aortic arch region and selectively positioned in the left common carotid artery.  FINDINGS:.:  FINDINGS:. The left common carotid arteriogram demonstrates the left external carotid artery and its major branches to be widely patent.  The left internal carotid artery at the bulb to the cranial skull base is also widely patent.  Patency is maintained into the petrous, cavernous and supraclinoid segments. Again demonstrated are 2 small saccular outpouchings projecting posteriorly from the supraclinoid left ICA, one in the region of the origin of the anterior choroidal artery.  The left middle cerebral artery and the left anterior cerebral artery opacify into the capillary and venous phases. Prompt filling via the anterior communicating artery of the right anterior cerebral A2 segment is seen.  Arising at the bifurcation of the left middle cerebral artery again noted is a saccular aneurysm with a relatively wide neck projecting slightly anteriorly.  A 3D rotational arteriogram was then performed from the left common carotid artery extending intracranially.  Reconstruction was then performed on a separate workstation. Optimal views were then obtained for treatment  of the aneurysm. The aneurysm measured to be approximately 4.1 mm in its maximum width, and 3.82 mm in height. It was decided to use a WEB SL W5-4.3 for intra saccular disruption device for embolization.  ENDOVASCULAR EMBOLIZATION OF LEFT MIDDLE CEREBRAL ARTERY BIFURCATION REGION ANEURYSM  The diagnostic JB 1 catheter in the left common carotid artery was then exchanged over a 0.035 inch 300 cm Rosen exchange guidewire for a 90 cm 6 [place] Infinity sheath. This was advanced to the bifurcation of the left common carotid artery. The guidewire was removed. Good aspiration obtained from the hub of the Infinity sheath. A gentle control arteriogram performed through this demonstrated no evidence of vasospasm or of intraluminal filling defects. This was then connected to continuous heparinized saline infusion. Through the Infinity sheath, a combination  of a 6 [place] 115 cm intermediary catheter inside of which was a Via 17 microcatheter was advanced over a 0.014 inch standard Synchro micro guidewire with a J configuration to the proximal cavernous segment. At this time, the Infinity guide sheath was advanced to the junction of the cervical petrous junction. Using biplane roadmap technique and constant fluoroscopic guidance, in a coaxial manner and with constant heparinized saline infusion, the micro guidewire was then gently advanced with a J-tip configuration to the left middle cerebral artery followed by the microcatheter. The micro guidewire was then gently advanced into the aneurysm followed by the microcatheter.  The guidewire was removed. Good aspiration obtained from the hub of the microcatheter. This was then connected to continuous heparinized saline infusion.  The WEB SL W5-4.3 intra saccular flow disrupter was then prepped and purged in heparinized saline. It was cleared of any air bubbles. This was then recaptured into the housing catheter.  This was then loaded onto the microcatheter after heparin flushed in the Tuohy Borst.  Smooth advancement was obtained to the middle cerebral artery very slowly ensuring no forward motion or movements of the microcatheter which was just inside the neck of the aneurysm.  The distal marker on the WEB device was advanced to the distal marker on the microcatheter.  The entire system was then straightened. With the microcatheter stable, the WEB device was advanced until early formation of a [place] configuration.  A control arteriogram performed through the [facility] guide catheter demonstrated safe position and orientation of the device within the aneurysm.  The microcatheter was then gently advanced further into the aneurysm followed by advancement of the WEB device into the aneurysm. Complete opening of the device was noted.  A control arteriogram performed through the [facility] guide catheter demonstrated  complete opening and apposition of the device into the aneurysm.  The superior and inferior branches of the left middle cerebral artery were patent without evidence of encroachment on the bifurcation branches.  With the WEB device well oriented in the AP and lateral projections, the microcatheter was gently withdrawn to just proximal to the proximal marker on the WEB device. Again the orientation of the WEB device within the aneurysm remained unchanged.  A control arteriogram performed through the [facility] guide catheter demonstrated excellent apposition on the AP and lateral projections with contrast stasis within the aneurysm.  Under constant biplane fluoroscopy, the device was disconnected and deployed using the detachment device at the proximal aspect of the delivery wire.  The micro guidewire was gently advanced just distal to the microcatheter then withdrawn.  A control arteriogram was then performed through the [facility] guide catheter in the left internal carotid artery which continued  to demonstrate excellent flow through the left middle cerebral artery distribution with patency of the superior and inferior divisions, and stasis within the aneurysm itself.  Control arteriograms were then performed at 10 and 20 minutes post deployment of the device. These continued to demonstrated no evidence of intraluminal filling defects, or of occlusions in the left middle cerebral artery and the left anterior cerebral artery distributions.  The [place] 6 [place] guide catheter was removed. The Infinity sheath was then retrieved to just proximal to the left common carotid artery bifurcation. Control arteriogram performed through this continued to demonstrate wide patency of the of the left internal carotid artery proximally and distally with no change in the intra saccular device within left middle cerebral artery aneurysm.  The Infinity sheath was removed. The 8 French Pinnacle sheath was then removed with the successful  application of an 8 French Angio-Seal closure device for hemostasis. Distal pulses remained palpable in both feet unchanged.  [name] ACT was in the region of approximately 250. This was partially reversed with 5 mg of protamine sulfate.  [name] general anesthesia was slowly reversed. She was able to follow simple commands following the extubation. She was transferred to PACU for post extubation management.  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  PLAN: Patient to be left on aspirin 81 mg a day and Plavix 75 mg per day for 2 weeks. Follow-up in the clinic 2 weeks post discharge.   Electronically Signed   By: Luanne Bras M.D.   On: 05/05/2020 09:52   Treatments: Endovascular embolization of left MCA aneurysm using WEB device  Discharge Exam: Blood pressure (!) 147/63, pulse (!) 54, temperature 98.5 F (36.9 C), temperature source Oral, resp. rate 17, height '5\' 5"'  (1.651 m), weight 180 lb 5.4 oz (81.8 kg), SpO2 100 %. Physical Exam Vitals and nursing note reviewed.  Constitutional:      General: She is not in acute distress.    Appearance: Normal appearance.  Cardiovascular:     Rate and Rhythm: Normal rate and regular rhythm.     Heart sounds: Normal heart sounds. No murmur heard.   Pulmonary:     Effort: Pulmonary effort is normal. No respiratory distress.     Breath sounds: Normal breath sounds. No wheezing.  Skin:    General: Skin is warm and dry.     Comments: Right femoral puncture site soft without active bleeding or hematoma.  Neurological:     Mental Status: She is alert and oriented to person, place, and time.     Comments: Moving all extremities. Speech clear. Distal pulses (DPs) 1+ bilaterally.     Disposition: Discharge disposition: 01-Home or Self Care       Discharge Instructions    Ambulatory referral to Physical Therapy    Complete by: As directed    Call MD for:  difficulty breathing, headache or visual disturbances   Complete by: As directed    Call MD for:  extreme fatigue   Complete by: As directed    Call MD for:  hives   Complete by: As directed    Call MD for:  persistant dizziness or light-headedness   Complete by: As directed    Call MD for:  persistant nausea and vomiting   Complete by: As directed    Call MD for:  redness, tenderness, or signs of infection (pain, swelling, redness, odor or green/yellow discharge  around incision site)   Complete by: As directed    Call MD for:  severe uncontrolled pain   Complete by: As directed    Call MD for:  temperature >100.4   Complete by: As directed    Diet - low sodium heart healthy   Complete by: As directed    Discharge instructions   Complete by: As directed    Continue taking Plavix 75 mg once daily and Aspirin 81 mg once daily until 2 week follow-up visit- will discuss possible discontinuation at that time. Follow-up with pain management within 1 week of discharge (medication adjustments, chronic left neck pain). Follow-up with outpatient PT- they should call you to set up appointments. Plan to follow-up with Dr. Estanislado Pandy in clinic 2 weeks after discharge (our schedulers will call you to set up this appointment).   Discharge wound care:   Complete by: As directed    Ok to remove dressing within 24 hours of discharge- no further dressing changes or restrictions on this.   Driving Restrictions   Complete by: As directed    No driving self until two week follow-up appointment. Ok to be passenger of car during this time.   Increase activity slowly   Complete by: As directed    Lifting restrictions   Complete by: As directed    No stooping, bending, or lifting more than 10 pounds for two weeks.     Allergies as of 05/11/2020      Reactions   Contrast Media [iodinated Diagnostic Agents]    Renal hypertension per physician      Medication  List    STOP taking these medications   gabapentin 600 MG tablet Commonly known as: NEURONTIN Replaced by: gabapentin 100 MG capsule     TAKE these medications   allopurinol 100 MG tablet Commonly known as: ZYLOPRIM Take 100 mg by mouth at bedtime.   amLODipine 10 MG tablet Commonly known as: NORVASC Take 10 mg by mouth daily.   APPLE CIDER VINEGAR PO Take 1 tablet by mouth daily.   aspirin 81 MG EC tablet Take 1 tablet (81 mg total) by mouth daily. Swallow whole.   atorvastatin 80 MG tablet Commonly known as: LIPITOR Take 1 tablet (80 mg total) by mouth daily.   baclofen 10 MG tablet Commonly known as: LIORESAL Take 10 mg by mouth 3 (three) times daily as needed for muscle spasms.   buprenorphine 20 MCG/HR Ptwk Commonly known as: BUTRANS Place 1 patch onto the skin every Monday.   clopidogrel 75 MG tablet Commonly known as: PLAVIX Take 75 mg by mouth daily.   ezetimibe 10 MG tablet Commonly known as: ZETIA Take 10 mg by mouth daily.   feeding supplement Liqd Take 237 mLs by mouth 2 (two) times daily between meals.   furosemide 40 MG tablet Commonly known as: Lasix Take 1 tablet (40 mg total) by mouth daily as needed. What changed: reasons to take this   gabapentin 100 MG capsule Commonly known as: NEURONTIN Take 2 capsules (200 mg total) by mouth 4 (four) times daily. Replaces: gabapentin 600 MG tablet   Ipratropium-Albuterol 20-100 MCG/ACT Aers respimat Commonly known as: COMBIVENT Inhale 1 puff into the lungs every 6 (six) hours as needed for wheezing or shortness of breath.   levothyroxine 75 MCG tablet Commonly known as: SYNTHROID Take 75 mcg by mouth daily.   lidocaine-prilocaine cream Commonly known as: EMLA Apply 1 application topically as needed. What changed:   when to take this  reasons to take this   losartan 25 MG tablet Commonly known as: Cozaar Take 1 tablet (25 mg total) by mouth daily.   metoprolol tartrate 50 MG  tablet Commonly known as: LOPRESSOR Take 50 mg by mouth 2 (two) times daily.   multivitamin with minerals Tabs tablet Take 1 tablet by mouth daily.   Narcan 4 MG/0.1ML Liqd nasal spray kit Generic drug: naloxone Place 1 spray into the nose as needed for opioid reversal.   oxyCODONE-acetaminophen 10-325 MG tablet Commonly known as: PERCOCET Take 1 tablet by mouth every 6 (six) hours as needed (breakthrough pain).   pantoprazole 40 MG tablet Commonly known as: PROTONIX Take 40 mg by mouth every morning.   pregabalin 25 MG capsule Commonly known as: LYRICA Take 25 mg by mouth 2 (two) times daily.   sennosides-docusate sodium 8.6-50 MG tablet Commonly known as: SENOKOT-S Take 1 tablet by mouth at bedtime.   Toujeo SoloStar 300 UNIT/ML Solostar Pen Generic drug: insulin glargine (1 Unit Dial) Inject 20 Units into the skin at bedtime.   Trulicity 1.5 AX/6.5VV Sopn Generic drug: Dulaglutide Inject 1.5 mg into the skin every Monday.   Vitamin D (Ergocalciferol) 1.25 MG (50000 UNIT) Caps capsule Commonly known as: DRISDOL Take 50,000 Units by mouth every Monday.   Xtampza ER 27 MG C12a Generic drug: oxyCODONE ER Take 27 mg by mouth 2 (two) times daily.            Discharge Care Instructions  (From admission, onward)         Start     Ordered   05/11/20 0000  Discharge wound care:       Comments: Ok to remove dressing within 24 hours of discharge- no further dressing changes or restrictions on this.   05/11/20 1418          Follow-up Information    Luanne Bras, MD Follow up in 2 week(s).   Specialties: Interventional Radiology, Radiology Why: Please follow-up with Dr. Estanislado Pandy in clinic 2 weeks after discharge. Our schedulers will call you to set up this appointment. Contact information: [place] [address] [phone]                Electronically Signed: Earley Abide, PA-C 05/11/2020, 2:20 PM   I have spent Greater  Than 30 Minutes discharging Bethany [name].

## 2020-05-11 NOTE — Discharge Instructions (Signed)
Femoral Site Care This sheet gives you information about how to care for yourself after your procedure. Your health care provider may also give you more specific instructions. If you have problems or questions, contact your health care provider. What can I expect after the procedure? After the procedure, it is common to have:  Bruising that usually fades within 1-2 weeks.  Tenderness at the site. Follow these instructions at home: Wound care 1. Follow instructions from your health care provider about how to take care of your insertion site. Make sure you: ? Wash your hands with soap and water before you change your bandage (dressing). If soap and water are not available, use hand sanitizer. ? Change your dressing as directed- pressure dressing removed 24 hours post-procedure (and switch for bandaid), bandaid removed 72 hours post-procedure 2. Do not take baths, swim, or use a hot tub for 7 days post-procedure. 3. You may shower 48 hours after the procedure or as told by your health care provider. ? Gently wash the site with plain soap and water. ? Pat the area dry with a clean towel. ? Do not rub the site. This may cause bleeding. 4. Check your site every day for signs of infection. Check for: ? Redness, swelling, or pain. ? Fluid or blood. ? Warmth. ? Pus or a bad smell. Activity  Do not stoop, bend, or lift anything that is heavier than 10 lb (4.5 kg) for 2 weeks post-procedure.  Do not drive self for 2 weeks post-procedure. Contact a health care provider if you have:  A fever or chills.  You have redness, swelling, or pain around your insertion site. Get help right away if:  The catheter insertion area swells very fast.  You pass out.  You suddenly start to sweat or your skin gets clammy.  The catheter insertion area is bleeding, and the bleeding does not stop when you hold steady pressure on the area.  The area near or just beyond the catheter insertion site becomes  pale, cool, tingly, or numb. These symptoms may represent a serious problem that is an emergency. Do not wait to see if the symptoms will go away. Get medical help right away. Call your local emergency services (911 in the [place] Do not drive yourself to the hospital.  This information is not intended to replace advice given to you by your health care provider. Make sure you discuss any questions you have with your health care provider. Document Revised: 05/15/2017 Document Reviewed: 05/15/2017 Elsevier Patient Education  2020 [employer] 

## 2020-05-11 NOTE — Progress Notes (Signed)
Referring Physician(s): Antony Contras (neurology)  Supervising Physician: Luanne Bras  Patient Status:  [facility] - In-pt  Chief Complaint: None  Subjective:  Left MCA aneurysms/p endovascular embolizationusing WEB device via right femoral approach 12/20/2021by Dr. Estanislado Pandy. Patient awake and alert sitting in chair watching TV with no complaints at this time. States her neck pain has subsided. States that she feels stronger than prior to weekend- states that she walked to bathroom without walker without difficulty.   Allergies: Contrast media [iodinated diagnostic agents]  Medications: Prior to Admission medications   Medication Sig Start Date End Date Taking? Authorizing Provider  allopurinol (ZYLOPRIM) 100 MG tablet Take 100 mg by mouth at bedtime.   Yes [provider]  amLODipine (NORVASC) 10 MG tablet Take 10 mg by mouth daily.   Yes [provider]  APPLE CIDER VINEGAR PO Take 1 tablet by mouth daily.   Yes [provider]  aspirin EC 81 MG EC tablet Take 1 tablet (81 mg total) by mouth daily. Swallow whole. 11/22/19  Yes Danford, Suann Larry, MD  atorvastatin (LIPITOR) 80 MG tablet Take 1 tablet (80 mg total) by mouth daily. 11/22/19  Yes Danford, Suann Larry, MD  baclofen (LIORESAL) 10 MG tablet Take 10 mg by mouth 3 (three) times daily as needed for muscle spasms.   Yes [provider]  buprenorphine (BUTRANS) 20 MCG/HR PTWK Place 1 patch onto the skin every Monday.  11/15/19  Yes [provider]  clopidogrel (PLAVIX) 75 MG tablet Take 75 mg by mouth daily.   Yes [provider]  Dulaglutide (TRULICITY) 1.5 RK/2.7CW SOPN Inject 1.5 mg into the skin every Monday.    Yes [provider]  ezetimibe (ZETIA) 10 MG tablet Take 10 mg by mouth daily.   Yes [provider]  gabapentin (NEURONTIN) 600 MG tablet Take 600 mg by mouth 4 (four) times daily.    Yes [provider]  insulin glargine, 1  Unit Dial, (TOUJEO SOLOSTAR) 300 UNIT/ML Solostar Pen Inject 20 Units into the skin at bedtime.    Yes [provider]  Ipratropium-Albuterol (COMBIVENT) 20-100 MCG/ACT AERS respimat Inhale 1 puff into the lungs every 6 (six) hours as needed for wheezing or shortness of breath. 03/17/17  Yes Hongalgi, Lenis Dickinson, MD  levothyroxine (SYNTHROID) 75 MCG tablet Take 75 mcg by mouth daily. 02/28/19  Yes [provider]  losartan (COZAAR) 25 MG tablet Take 1 tablet (25 mg total) by mouth daily. 11/22/19 11/21/20 Yes Danford, Suann Larry, MD  metoprolol (LOPRESSOR) 50 MG tablet Take 50 mg by mouth 2 (two) times daily.  03/06/14  Yes [provider]  Multiple Vitamin (MULTIVITAMIN WITH MINERALS) TABS tablet Take 1 tablet by mouth daily.   Yes [provider]  oxyCODONE-acetaminophen (PERCOCET) 10-325 MG tablet Take 1 tablet by mouth every 6 (six) hours as needed (breakthrough pain).    Yes [provider]  pregabalin (LYRICA) 25 MG capsule Take 25 mg by mouth 2 (two) times daily.  01/24/19  Yes [provider]  sennosides-docusate sodium (SENOKOT-S) 8.6-50 MG tablet Take 1 tablet by mouth at bedtime.   Yes [provider]  Vitamin D, Ergocalciferol, (DRISDOL) 1.25 MG (50000 UNIT) CAPS capsule Take 50,000 Units by mouth every Monday.    Yes [provider]  XTAMPZA ER 27 MG C12A Take 27 mg by mouth 2 (two) times daily.  02/27/19  Yes [provider]  feeding supplement, ENSURE ENLIVE, (ENSURE ENLIVE) LIQD Take 237 mLs  by mouth 2 (two) times daily between meals. 03/30/19   Bonnell Public, MD  furosemide (LASIX) 40 MG tablet Take 1 tablet (40 mg total) by mouth daily as needed. Patient taking differently: Take 40 mg by mouth daily as needed for fluid. 05/20/19 05/19/20  O'NealCassie Freer, MD  lidocaine-prilocaine (EMLA) cream Apply 1 application topically as needed. Patient taking differently: Apply 1 application topically daily as  needed (port access). 07/03/19   Truitt Merle, MD  [facility] 4 MG/0.1ML LIQD nasal spray kit Place 1 spray into the nose as needed for opioid reversal. 08/22/19   [provider]  pantoprazole (PROTONIX) 40 MG tablet Take 40 mg by mouth every morning.    [provider]     Vital Signs: BP (!) 106/46 (BP Location: Left Arm)   Pulse (!) 52   Temp 98.3 F (36.8 C) (Oral)   Resp 18   Ht '5\' 5"'  (1.651 m)   Wt 180 lb 5.4 oz (81.8 kg)   SpO2 97%   BMI 30.01 kg/m   Physical Exam Vitals and nursing note reviewed.  Constitutional:      General: She is not in acute distress.    Appearance: Normal appearance.  Cardiovascular:     Rate and Rhythm: Normal rate and regular rhythm.     Heart sounds: Normal heart sounds. No murmur heard.   Pulmonary:     Effort: Pulmonary effort is normal. No respiratory distress.     Breath sounds: Normal breath sounds. No wheezing.  Skin:    General: Skin is warm and dry.  Neurological:     Mental Status: She is alert and oriented to person, place, and time.     Comments: Moving all extremities. Speech clear.     Imaging: No results found.  Labs:  CBC: Recent Labs    04/27/20 0719 05/04/20 1645 05/05/20 0436 05/08/20 1316  WBC 12.1* 14.0* 14.3* 9.4  HGB 10.1* 8.8* 7.8* 8.5*  HCT 33.6* 29.7* 27.5* 29.3*  PLT 296 229 200 246    COAGS: Recent Labs    11/19/19 1317 02/06/20 0828 04/27/20 0719  INR 1.0 1.0 1.1  APTT 26  --  29    BMP: Recent Labs    11/20/19 0433 11/22/19 1139 01/24/20 1212 02/06/20 0718 02/06/20 1124 04/27/20 0719 05/04/20 1645 05/05/20 0436 05/08/20 1316  NA 139 136 139 137   < > 138 137 140 139  K 4.2 4.2 4.6 6.4*   < > 4.9 5.0 4.4 4.4  CL 103 103 107 103   < > 105 108 109 108  CO2 '26 25 23 24  ' --  24 21* 21* 24  GLUCOSE 131* 169* 232* 410*   < > 228* 242* 106* 219*  BUN 16 12 26* 28*   < > 24* 27* 22 15  CALCIUM 9.9 9.8 10.8* 10.4*  --  10.1 8.7* 8.8* 9.9  CREATININE 1.01*  1.04* 1.03*  1.21* 1.56*   < > 1.58* 1.26* 1.12* 1.08*  GFRNONAA 57*  55* 55* 46* 34*  --  35* 46* 53* 55*  GFRAA >60  >60 >60 53* 39*  --   --   --   --   --    < > = values in this interval not displayed.    LIVER FUNCTION TESTS: Recent Labs    07/19/19 1340 11/19/19 1317 01/24/20 1212 05/04/20 1645  BILITOT 0.5 0.7 0.3 0.6  AST 77* '20 28 31  ' ALT 119* 23  56* 65*  ALKPHOS 330* 159* 237* 192*  PROT 7.4 6.9 7.2 5.3*  ALBUMIN 3.9 3.9 3.9 3.0*    Assessment and Plan:  Left MCA aneurysms/p endovascular embolizationusing WEB device via right femoral approach 12/20/2021by Dr. Estanislado Pandy. Patient neurologically intact and stable from procedure standpoint. She is pending CIR (insurance pending secondary to holiday, unsure of bed availability at this time), however she states that she feels stronger than prior to weekend- patient to be re-evaluated by PT/OT today (?possibly might not need CIR depending on this evaluation). Continue taking Plavix 75 mg once daily and Aspirin 81 mg once daily. NIR to follow.   Electronically Signed: Earley Abide, PA-C 05/11/2020, 9:09 AM   I spent a total of 25 Minutes at the the patient's bedside AND on the patient's hospital floor or unit, greater than 50% of which was counseling/coordinating care for left MCA aneurysm s/p embolization.

## 2020-05-11 NOTE — Plan of Care (Signed)

## 2020-05-11 NOTE — Progress Notes (Signed)
Patient for discharge home today.  IV discontinued, AVS discussed.  Tylenol 2  given for headache prior to discharge. Denies any other discomfort.  Daughter will  transport  Pt home.

## 2020-05-11 NOTE — Plan of Care (Signed)

## 2020-05-11 NOTE — Progress Notes (Signed)
Inpatient Rehabilitation-Admissions Coordinator   Still waiting on a determination from her insurance for CIR.   Cheri Rous, OTR/L  Rehab Admissions Coordinator  [phone] 05/11/2020 1:54 PM

## 2020-05-11 NOTE — TOC Transition Note (Signed)
Transition of Care [facility]) - CM/SW Discharge Note   Patient Details  Name: Bethany [name] MRN: [id number] Date of Birth: 02/21/1951  [facility] of [facility]) CM/SW Contact:  Kermit Balo, RN Phone Number: 05/11/2020, 2:38 PM   Clinical Narrative:    Pt is discharging home with [facility] services arranged through [facility].  Pt has walker at home and will order a tub bench through [facility] for home.  Pts daughter lives across the hall and can check in on her frequently.  Pt uses [employer] transportation services for MD appts.  Pt has transport home.    Final next level of care: Home w Home Health Services Barriers to Discharge: No Barriers Identified   Patient Goals and CMS Choice   CMS Medicare.gov Compare Post Acute Care list provided to:: Patient Choice offered to / list presented to : Patient  Discharge Placement                       Discharge Plan and Services                          HH Arranged: PT,OT [facility] Agency: [facility] Date [facility] Agency Contacted: 05/11/20   Representative spoke with at [facility] Agency: Kandee Keen  Social Determinants of Health (SDOH) Interventions     Readmission Risk Interventions No flowsheet data found.

## 2020-05-13 ENCOUNTER — Ambulatory Visit: Payer: Medicare Other | Admitting: Adult Health

## 2020-05-18 DIAGNOSIS — K5900 Constipation, unspecified: Secondary | ICD-10-CM | POA: Diagnosis not present

## 2020-05-18 DIAGNOSIS — G9340 Encephalopathy, unspecified: Secondary | ICD-10-CM | POA: Diagnosis not present

## 2020-05-18 DIAGNOSIS — Z7902 Long term (current) use of antithrombotics/antiplatelets: Secondary | ICD-10-CM | POA: Diagnosis not present

## 2020-05-18 DIAGNOSIS — G8929 Other chronic pain: Secondary | ICD-10-CM | POA: Diagnosis not present

## 2020-05-18 DIAGNOSIS — Z48812 Encounter for surgical aftercare following surgery on the circulatory system: Secondary | ICD-10-CM | POA: Diagnosis not present

## 2020-05-18 DIAGNOSIS — J449 Chronic obstructive pulmonary disease, unspecified: Secondary | ICD-10-CM | POA: Diagnosis not present

## 2020-05-18 DIAGNOSIS — E119 Type 2 diabetes mellitus without complications: Secondary | ICD-10-CM | POA: Diagnosis not present

## 2020-05-18 DIAGNOSIS — M542 Cervicalgia: Secondary | ICD-10-CM | POA: Diagnosis not present

## 2020-05-18 DIAGNOSIS — Z9181 History of falling: Secondary | ICD-10-CM | POA: Diagnosis not present

## 2020-05-18 DIAGNOSIS — K219 Gastro-esophageal reflux disease without esophagitis: Secondary | ICD-10-CM | POA: Diagnosis not present

## 2020-05-18 DIAGNOSIS — D500 Iron deficiency anemia secondary to blood loss (chronic): Secondary | ICD-10-CM | POA: Diagnosis not present

## 2020-05-18 DIAGNOSIS — M479 Spondylosis, unspecified: Secondary | ICD-10-CM | POA: Diagnosis not present

## 2020-05-18 DIAGNOSIS — K922 Gastrointestinal hemorrhage, unspecified: Secondary | ICD-10-CM | POA: Diagnosis not present

## 2020-05-18 DIAGNOSIS — I10 Essential (primary) hypertension: Secondary | ICD-10-CM | POA: Diagnosis not present

## 2020-05-18 DIAGNOSIS — M25512 Pain in left shoulder: Secondary | ICD-10-CM | POA: Diagnosis not present

## 2020-05-18 DIAGNOSIS — E785 Hyperlipidemia, unspecified: Secondary | ICD-10-CM | POA: Diagnosis not present

## 2020-05-18 DIAGNOSIS — Z7982 Long term (current) use of aspirin: Secondary | ICD-10-CM | POA: Diagnosis not present

## 2020-05-18 DIAGNOSIS — Z794 Long term (current) use of insulin: Secondary | ICD-10-CM | POA: Diagnosis not present

## 2020-05-18 DIAGNOSIS — Z86718 Personal history of other venous thrombosis and embolism: Secondary | ICD-10-CM | POA: Diagnosis not present

## 2020-05-18 DIAGNOSIS — J90 Pleural effusion, not elsewhere classified: Secondary | ICD-10-CM | POA: Diagnosis not present

## 2020-05-18 DIAGNOSIS — F17210 Nicotine dependence, cigarettes, uncomplicated: Secondary | ICD-10-CM | POA: Diagnosis not present

## 2020-05-18 DIAGNOSIS — M170 Bilateral primary osteoarthritis of knee: Secondary | ICD-10-CM | POA: Diagnosis not present

## 2020-05-19 DIAGNOSIS — M542 Cervicalgia: Secondary | ICD-10-CM | POA: Diagnosis not present

## 2020-05-19 DIAGNOSIS — K922 Gastrointestinal hemorrhage, unspecified: Secondary | ICD-10-CM | POA: Diagnosis not present

## 2020-05-19 DIAGNOSIS — K219 Gastro-esophageal reflux disease without esophagitis: Secondary | ICD-10-CM | POA: Diagnosis not present

## 2020-05-19 DIAGNOSIS — M25512 Pain in left shoulder: Secondary | ICD-10-CM | POA: Diagnosis not present

## 2020-05-19 DIAGNOSIS — Z86718 Personal history of other venous thrombosis and embolism: Secondary | ICD-10-CM | POA: Diagnosis not present

## 2020-05-19 DIAGNOSIS — G9340 Encephalopathy, unspecified: Secondary | ICD-10-CM | POA: Diagnosis not present

## 2020-05-19 DIAGNOSIS — E119 Type 2 diabetes mellitus without complications: Secondary | ICD-10-CM | POA: Diagnosis not present

## 2020-05-19 DIAGNOSIS — J449 Chronic obstructive pulmonary disease, unspecified: Secondary | ICD-10-CM | POA: Diagnosis not present

## 2020-05-19 DIAGNOSIS — E785 Hyperlipidemia, unspecified: Secondary | ICD-10-CM | POA: Diagnosis not present

## 2020-05-19 DIAGNOSIS — Z7902 Long term (current) use of antithrombotics/antiplatelets: Secondary | ICD-10-CM | POA: Diagnosis not present

## 2020-05-19 DIAGNOSIS — M479 Spondylosis, unspecified: Secondary | ICD-10-CM | POA: Diagnosis not present

## 2020-05-19 DIAGNOSIS — K5900 Constipation, unspecified: Secondary | ICD-10-CM | POA: Diagnosis not present

## 2020-05-19 DIAGNOSIS — Z9181 History of falling: Secondary | ICD-10-CM | POA: Diagnosis not present

## 2020-05-19 DIAGNOSIS — Z48812 Encounter for surgical aftercare following surgery on the circulatory system: Secondary | ICD-10-CM | POA: Diagnosis not present

## 2020-05-19 DIAGNOSIS — G8929 Other chronic pain: Secondary | ICD-10-CM | POA: Diagnosis not present

## 2020-05-19 DIAGNOSIS — F17210 Nicotine dependence, cigarettes, uncomplicated: Secondary | ICD-10-CM | POA: Diagnosis not present

## 2020-05-19 DIAGNOSIS — Z7982 Long term (current) use of aspirin: Secondary | ICD-10-CM | POA: Diagnosis not present

## 2020-05-19 DIAGNOSIS — Z794 Long term (current) use of insulin: Secondary | ICD-10-CM | POA: Diagnosis not present

## 2020-05-19 DIAGNOSIS — D500 Iron deficiency anemia secondary to blood loss (chronic): Secondary | ICD-10-CM | POA: Diagnosis not present

## 2020-05-19 DIAGNOSIS — J90 Pleural effusion, not elsewhere classified: Secondary | ICD-10-CM | POA: Diagnosis not present

## 2020-05-19 DIAGNOSIS — I10 Essential (primary) hypertension: Secondary | ICD-10-CM | POA: Diagnosis not present

## 2020-05-19 DIAGNOSIS — M170 Bilateral primary osteoarthritis of knee: Secondary | ICD-10-CM | POA: Diagnosis not present

## 2020-05-20 NOTE — Progress Notes (Signed)
[place]   [phone] Fax:([phone]) [phone]   Clinic Follow up Note   Patient Care Team: Wenda Low, MD as PCP - General (Internal Medicine) O'Neal, Cassie Freer, MD as PCP - Cardiology (Cardiology) Truitt Merle, MD as Consulting Physician (Hematology) Angelia Mould, MD as Consulting Physician (Vascular Surgery) Koleen Distance, MD as Referring Physician (Gastroenterology)  Date of Service:  05/22/2020  CHIEF COMPLAINT: f/u of anemia   CURRENT THERAPY:  IV Feraheme as needed (if ferritin <100). Changed to IV Venofer on 01/02/19  INTERVAL HISTORY:  Bethany [name] is here for a follow up of anemia. She presents to the clinic alone. She notes she underwent embolization of her aneurysm on 05/04/20 and since she has 2 more she may have another procedure in 6 months. She notes after procedure she had 7 day hospital stay. She feels she has recovered from procedure but still has occasional mild confusion or memory issues. She notes after procedure she restarted both Plavix and baby aspirin. She notes since 04/2020 hospitalization she has had black stool, prior to that she had constipation. I reviewed her medication list with her. She was told to restart Plavix and baby Asprin until nest neuro f/u.   REVIEW OF SYSTEMS:   Constitutional: Denies fevers, chills or abnormal weight loss Eyes: Denies blurriness of vision Ears, nose, mouth, throat, and face: Denies mucositis or sore throat Respiratory: Denies cough, dyspnea or wheezes Cardiovascular: Denies palpitation, chest discomfort or lower extremity swelling Gastrointestinal:  Denies nausea, heartburn or change in bowel habits Skin: Denies abnormal skin rashes Lymphatics: Denies new lymphadenopathy or easy bruising Neurological:Denies numbness, tingling or new weaknesses Behavioral/Psych: Mood is stable, no new changes  All other systems were reviewed with the patient and are negative.  MEDICAL HISTORY:   Past Medical History:  Diagnosis Date  . Acute renal failure ([place])   . Anemia   . Arthritis   . COPD (chronic obstructive pulmonary disease) ([place])   . Diabetes mellitus without complication ([place])    type II   . GERD (gastroesophageal reflux disease)   . Hypercalcemia   . Hyperlipidemia   . Hypertension   . Pneumonia   . Renal disorder   . Single kidney   . Stroke [facility])    July 2021  . Thrombosis   . Tobacco abuse     SURGICAL HISTORY: Past Surgical History:  Procedure Laterality Date  . ABDOMINAL HYSTERECTOMY    . blood clots removed from descending aorta     . CHOLECYSTECTOMY    . COLONOSCOPY    . COLONOSCOPY WITH PROPOFOL N/A 04/17/2017   Procedure: COLONOSCOPY WITH PROPOFOL;  Surgeon: Wilford Corner, MD;  Location: [facility];  Service: Endoscopy;  Laterality: N/A;  . ESOPHAGOGASTRODUODENOSCOPY (EGD) WITH PROPOFOL N/A 04/17/2017   Procedure: ESOPHAGOGASTRODUODENOSCOPY (EGD) WITH PROPOFOL;  Surgeon: Wilford Corner, MD;  Location: [facility];  Service: Endoscopy;  Laterality: N/A;  . IR 3D INDEPENDENT WKST  05/04/2020  . IR ANGIO INTRA EXTRACRAN SEL COM CAROTID INNOMINATE BILAT MOD SED  02/06/2020  . IR ANGIO INTRA EXTRACRAN SEL INTERNAL CAROTID UNI L MOD SED  05/04/2020  . IR ANGIO VERTEBRAL SEL VERTEBRAL BILAT MOD SED  02/06/2020  . IR ANGIOGRAM FOLLOW UP STUDY  05/04/2020  . IR IMAGING GUIDED PORT INSERTION  05/01/2019  . IR NEURO EACH ADD'L AFTER BASIC UNI LEFT (MS)  05/04/2020  . IR TRANSCATH/EMBOLIZ  05/04/2020  . IR US GUIDE VASC ACCESS RIGHT  02/06/2020  . IR US  GUIDE VASC ACCESS RIGHT  05/04/2020  . NEPHRECTOMY RECIPIENT    . RADIOLOGY WITH ANESTHESIA N/A 05/04/2020   Procedure: Sheppard Plumber;  Surgeon: Luanne Bras, MD;  Location: [place];  Service: Radiology;  Laterality: N/A;    I have reviewed the social history and family history with the patient and they are unchanged from previous note.  ALLERGIES:  is allergic to contrast media [iodinated  diagnostic agents].  MEDICATIONS:  Current Outpatient Medications  Medication Sig Dispense Refill  . allopurinol (ZYLOPRIM) 100 MG tablet Take 100 mg by mouth at bedtime.    Marland Kitchen amLODipine (NORVASC) 10 MG tablet Take 10 mg by mouth daily.    . APPLE CIDER VINEGAR PO Take 1 tablet by mouth daily.    Marland Kitchen aspirin EC 81 MG EC tablet Take 1 tablet (81 mg total) by mouth daily. Swallow whole. 30 tablet 11  . atorvastatin (LIPITOR) 80 MG tablet Take 1 tablet (80 mg total) by mouth daily. 30 tablet 11  . baclofen (LIORESAL) 10 MG tablet Take 10 mg by mouth 3 (three) times daily as needed for muscle spasms.    . buprenorphine (BUTRANS) 20 MCG/HR PTWK Place 1 patch onto the skin every Monday.     . clopidogrel (PLAVIX) 75 MG tablet Take 75 mg by mouth daily.    . Dulaglutide (TRULICITY) 1.5 TG/2.5WL SOPN Inject 1.5 mg into the skin every Monday.     . ezetimibe (ZETIA) 10 MG tablet Take 10 mg by mouth daily.    . feeding supplement, ENSURE ENLIVE, (ENSURE ENLIVE) LIQD Take 237 mLs by mouth 2 (two) times daily between meals. 237 mL 12  . furosemide (LASIX) 40 MG tablet Take 1 tablet (40 mg total) by mouth daily as needed. (Patient taking differently: Take 40 mg by mouth daily as needed for fluid.) 45 tablet 3  . gabapentin (NEURONTIN) 100 MG capsule Take 2 capsules (200 mg total) by mouth 4 (four) times daily. 240 capsule 3  . insulin glargine, 1 Unit Dial, (TOUJEO SOLOSTAR) 300 UNIT/ML Solostar Pen Inject 20 Units into the skin at bedtime.     . Ipratropium-Albuterol (COMBIVENT) 20-100 MCG/ACT AERS respimat Inhale 1 puff into the lungs every 6 (six) hours as needed for wheezing or shortness of breath. 1 Inhaler 0  . levothyroxine (SYNTHROID) 75 MCG tablet Take 75 mcg by mouth daily.    Marland Kitchen lidocaine-prilocaine (EMLA) cream Apply 1 application topically as needed. (Patient taking differently: Apply 1 application topically daily as needed (port access).) 30 g 1  . losartan (COZAAR) 25 MG tablet Take 1 tablet (25  mg total) by mouth daily. 30 tablet 11  . metoprolol (LOPRESSOR) 50 MG tablet Take 50 mg by mouth 2 (two) times daily.     . Multiple Vitamin (MULTIVITAMIN WITH MINERALS) TABS tablet Take 1 tablet by mouth daily.    Marland Kitchen NARCAN 4 MG/0.1ML LIQD nasal spray kit Place 1 spray into the nose as needed for opioid reversal.    . oxyCODONE-acetaminophen (PERCOCET) 10-325 MG tablet Take 1 tablet by mouth every 6 (six) hours as needed (breakthrough pain).     . pantoprazole (PROTONIX) 40 MG tablet Take 40 mg by mouth every morning.    . pregabalin (LYRICA) 25 MG capsule Take 25 mg by mouth 2 (two) times daily.     . sennosides-docusate sodium (SENOKOT-S) 8.6-50 MG tablet Take 1 tablet by mouth at bedtime.    . Vitamin D, Ergocalciferol, (DRISDOL) 1.25 MG (50000 UNIT) CAPS capsule Take 50,000 Units by  mouth every Monday.     Marland Kitchen XTAMPZA ER 27 MG C12A Take 27 mg by mouth 2 (two) times daily.      Current Facility-Administered Medications  Medication Dose Route Frequency Provider Last Rate Last Admin  . furosemide (LASIX) injection 20 mg  20 mg Intravenous Once Truitt Merle, MD        PHYSICAL EXAMINATION: ECOG PERFORMANCE STATUS: 2 - Symptomatic, <50% confined to bed  Vitals:   05/22/20 1039  BP: (!) 146/54  Pulse: 72  Resp: 18  Temp: (!) 97.1 F (36.2 C)  SpO2: 98%   Filed Weights   05/22/20 1039  Weight: 172 lb 3.2 oz (78.1 kg)    Due to COVID19 we will limit examination to appearance. Patient had no complaints.  GENERAL:alert, no distress and comfortable SKIN: skin color normal, no rashes or significant lesions EYES: normal, Conjunctiva are pink and non-injected, sclera clear  NEURO: alert & oriented x 3 with fluent speech  LABORATORY DATA:  I have reviewed the data as listed CBC Latest Ref Rng & Units 05/22/2020 05/08/2020 05/05/2020  WBC 4.0 - 10.5 K/uL 10.4 9.4 14.3(H)  Hemoglobin 12.0 - 15.0 g/dL 6.2(LL) 8.5(L) 7.8(L)  Hematocrit 36.0 - 46.0 % 22.5(L) 29.3(L) 27.5(L)  Platelets 150 -  400 K/uL 399 246 200     CMP Latest Ref Rng & Units 05/08/2020 05/05/2020 05/04/2020  Glucose 70 - 99 mg/dL 219(H) 106(H) 242(H)  BUN 8 - 23 mg/dL 15 22 27(H)  Creatinine 0.44 - 1.00 mg/dL 1.08(H) 1.12(H) 1.26(H)  Sodium 135 - 145 mmol/L 139 140 137  Potassium 3.5 - 5.1 mmol/L 4.4 4.4 5.0  Chloride 98 - 111 mmol/L 108 109 108  CO2 22 - 32 mmol/L 24 21(L) 21(L)  Calcium 8.9 - 10.3 mg/dL 9.9 8.8(L) 8.7(L)  Total Protein 6.5 - 8.1 g/dL - - 5.3(L)  Total Bilirubin 0.3 - 1.2 mg/dL - - 0.6  Alkaline Phos 38 - 126 U/L - - 192(H)  AST 15 - 41 U/L - - 31  ALT 0 - 44 U/L - - 65(H)      RADIOGRAPHIC STUDIES: I have personally reviewed the radiological images as listed and agreed with the findings in the report. No results found.   ASSESSMENT & PLAN:  Bethany [name] is a 70 y.o. female with   1. 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 She has required blood transfusions in the past in 2013 and in 2018 and frequent hospital admission for anemia. -She has intermittent epistaxis, but no skin or mucosa telangiectasia, no significant family history of bleeding disorder, she does not meet the diagnosis criteria for hereditary hemorrhagic telangiectasia. -Her lastcolonoscopy/endoscopy was 04/2017. -She is not on oral iron, due to the lack of benefit. -Currently being treated withIV Iron to prevent significant anemiawith goal of Ferritin 100-200 range. She was switched to IV Venofer on 8/19/20due to her insurance coverage.Increased to 434m with each infusion from 02/14/20. She has been tolerating well  -Labs reviewed, Hg 6.2, ANC 8.5. She is weak and fatigued. She has had brain procedure and increased black stool. Will proceed with IV Venofer today and blood transfusion today or tomorrow.  -Continue 4035mVenofer every 2 weeks. Will hold Venofer if  previous ferritin>300 -F/u in 4 weeks.   2. Chronic GI Bleeding, intermittent epistaxis, constipation -Has intestinal AVM history  -Lastcolonoscopy/EGD by  Dr. Michail Sermon on 04/17/17 showed gastritis, diverticulosis and internal hemorrhoids, all benign with no obvious signs of bleeding at that time. -She does not meet the diagnosis criteria for hereditary hemorrhagic telangiectasia (HHT) -She plans to have capsule endoscopy with GIDr. Oliver Pila the [facility]. -After 04/2020 hospitalization she was initially constipated then has had black stool since. She currently has constipation again. I advised her to take laxative to have a BM. She is on baby asprin and Plavix since brain procedure which can contribute to bleeding. Will monitor closely.   3. H/o of arterial thrombosis,Ischemic stroke 11/2019, Left Brain Aneurysm (53m and 243m -S/p unilateral nephrectomy due to damage fromarterialthrombosis -Managed by vascular physician, Dr. DiJoyce Grosseurologist Dr SeLeonie ManPrevious work-up for lupus anticoagulantand antiphospholipid syndromewerenegative, normal protein S and C level. -She notes she still has mild speech deficit (difficulty finding her words).  -On 02/06/20 Dr DeAllegra Granaound a second brain aneurysm. She underwent embolization surgery on 05/04/20  -After 04/2020 procedure she has restarted baby aspirin and Plavix until her next neuro f/u. Will monitor her labs closely.   4. Smoking Cessation, Lung Nodules -Currently smokes 1.5packs over 2 days currently. She has been smoking for 40 years. -IJaneal Holmesepeatedly encouraged her to continue to try to quit completely, she is willing to try.  -Her CT Chest from 07/12/19 shows Stable 7 mm nodule in left lower lobe. Also shows New 4 mm nodule is noted in left upper lobe and 12 mm sub solid density is noted in left lung apex.   5. COPD, HTN, CKD stage III,DM with Peripheral Neuropathy,Chronic Back Pain  -She is currently  on 6005mabapentin 4 times a day. -[facility]. I discussed this can contribute to constipation.   6. Cancer Screening  -Her 07/12/19 CT chest also showed 45m61mss right breast mass which Pt notes she has had for years. Previous diagnostic mammogram showed likely benign, and she previously had biopsy in 2014 which showed fibroadenoma. I highly recommend she have annual mammogram for better screening.    PLAN: -Proceed with Venofer 400mg36may and blood transfusion 1u (due to blood supply shortage) with lasix today -Lab, flush, IV Venofer 400mg 27mblood transfusion next week if Hg<8.0.  -Lab, flush, and IV Venofer 400mg i54m 4 weeks, will hold Venofer if previous ferritin>300 -f/u in 4 weeks.    No problem-specific Assessment & Plan notes found for this encounter.   No orders of the defined types were placed in this encounter.  All questions were answered. The patient knows to call the clinic with any problems, questions or concerns. No barriers to learning was detected. The total time spent in the appointment was 30 minutes.     Bethany [name] FenTruitt Merle7/2022   I, Amoya BJoslyn Devonting as scribe for Zuriyah Shatz FenTruitt Merle

## 2020-05-21 DIAGNOSIS — M479 Spondylosis, unspecified: Secondary | ICD-10-CM | POA: Diagnosis not present

## 2020-05-21 DIAGNOSIS — J90 Pleural effusion, not elsewhere classified: Secondary | ICD-10-CM | POA: Diagnosis not present

## 2020-05-21 DIAGNOSIS — Z7902 Long term (current) use of antithrombotics/antiplatelets: Secondary | ICD-10-CM | POA: Diagnosis not present

## 2020-05-21 DIAGNOSIS — Z794 Long term (current) use of insulin: Secondary | ICD-10-CM | POA: Diagnosis not present

## 2020-05-21 DIAGNOSIS — K5900 Constipation, unspecified: Secondary | ICD-10-CM | POA: Diagnosis not present

## 2020-05-21 DIAGNOSIS — I10 Essential (primary) hypertension: Secondary | ICD-10-CM | POA: Diagnosis not present

## 2020-05-21 DIAGNOSIS — M25512 Pain in left shoulder: Secondary | ICD-10-CM | POA: Diagnosis not present

## 2020-05-21 DIAGNOSIS — Z87891 Personal history of nicotine dependence: Secondary | ICD-10-CM | POA: Diagnosis not present

## 2020-05-21 DIAGNOSIS — Z9181 History of falling: Secondary | ICD-10-CM | POA: Diagnosis not present

## 2020-05-21 DIAGNOSIS — K922 Gastrointestinal hemorrhage, unspecified: Secondary | ICD-10-CM | POA: Diagnosis not present

## 2020-05-21 DIAGNOSIS — G9340 Encephalopathy, unspecified: Secondary | ICD-10-CM | POA: Diagnosis not present

## 2020-05-21 DIAGNOSIS — E785 Hyperlipidemia, unspecified: Secondary | ICD-10-CM | POA: Diagnosis not present

## 2020-05-21 DIAGNOSIS — Z7982 Long term (current) use of aspirin: Secondary | ICD-10-CM | POA: Diagnosis not present

## 2020-05-21 DIAGNOSIS — G894 Chronic pain syndrome: Secondary | ICD-10-CM | POA: Diagnosis not present

## 2020-05-21 DIAGNOSIS — M5137 Other intervertebral disc degeneration, lumbosacral region: Secondary | ICD-10-CM | POA: Diagnosis not present

## 2020-05-21 DIAGNOSIS — Z79899 Other long term (current) drug therapy: Secondary | ICD-10-CM | POA: Diagnosis not present

## 2020-05-21 DIAGNOSIS — F17210 Nicotine dependence, cigarettes, uncomplicated: Secondary | ICD-10-CM | POA: Diagnosis not present

## 2020-05-21 DIAGNOSIS — Z48812 Encounter for surgical aftercare following surgery on the circulatory system: Secondary | ICD-10-CM | POA: Diagnosis not present

## 2020-05-21 DIAGNOSIS — M170 Bilateral primary osteoarthritis of knee: Secondary | ICD-10-CM | POA: Diagnosis not present

## 2020-05-21 DIAGNOSIS — M79604 Pain in right leg: Secondary | ICD-10-CM | POA: Diagnosis not present

## 2020-05-21 DIAGNOSIS — J449 Chronic obstructive pulmonary disease, unspecified: Secondary | ICD-10-CM | POA: Diagnosis not present

## 2020-05-21 DIAGNOSIS — M542 Cervicalgia: Secondary | ICD-10-CM | POA: Diagnosis not present

## 2020-05-21 DIAGNOSIS — Z9189 Other specified personal risk factors, not elsewhere classified: Secondary | ICD-10-CM | POA: Diagnosis not present

## 2020-05-21 DIAGNOSIS — K219 Gastro-esophageal reflux disease without esophagitis: Secondary | ICD-10-CM | POA: Diagnosis not present

## 2020-05-21 DIAGNOSIS — G8929 Other chronic pain: Secondary | ICD-10-CM | POA: Diagnosis not present

## 2020-05-21 DIAGNOSIS — D500 Iron deficiency anemia secondary to blood loss (chronic): Secondary | ICD-10-CM | POA: Diagnosis not present

## 2020-05-21 DIAGNOSIS — Z86718 Personal history of other venous thrombosis and embolism: Secondary | ICD-10-CM | POA: Diagnosis not present

## 2020-05-21 DIAGNOSIS — E119 Type 2 diabetes mellitus without complications: Secondary | ICD-10-CM | POA: Diagnosis not present

## 2020-05-22 ENCOUNTER — Other Ambulatory Visit: Payer: Self-pay

## 2020-05-22 ENCOUNTER — Inpatient Hospital Stay: Payer: Medicare Other

## 2020-05-22 ENCOUNTER — Encounter: Payer: Self-pay | Admitting: Hematology

## 2020-05-22 ENCOUNTER — Inpatient Hospital Stay (HOSPITAL_BASED_OUTPATIENT_CLINIC_OR_DEPARTMENT_OTHER): Payer: Medicare Other | Admitting: Hematology

## 2020-05-22 ENCOUNTER — Inpatient Hospital Stay: Payer: Medicare Other | Attending: Hematology

## 2020-05-22 VITALS — BP 146/54 | HR 72 | Temp 97.1°F | Resp 18 | Ht 65.0 in | Wt 172.2 lb

## 2020-05-22 VITALS — BP 129/42 | HR 53 | Temp 98.9°F | Resp 18

## 2020-05-22 DIAGNOSIS — D500 Iron deficiency anemia secondary to blood loss (chronic): Secondary | ICD-10-CM | POA: Diagnosis not present

## 2020-05-22 DIAGNOSIS — K5900 Constipation, unspecified: Secondary | ICD-10-CM | POA: Insufficient documentation

## 2020-05-22 DIAGNOSIS — N183 Chronic kidney disease, stage 3 unspecified: Secondary | ICD-10-CM | POA: Diagnosis not present

## 2020-05-22 DIAGNOSIS — Q2733 Arteriovenous malformation of digestive system vessel: Secondary | ICD-10-CM | POA: Insufficient documentation

## 2020-05-22 DIAGNOSIS — E1122 Type 2 diabetes mellitus with diabetic chronic kidney disease: Secondary | ICD-10-CM | POA: Insufficient documentation

## 2020-05-22 DIAGNOSIS — K922 Gastrointestinal hemorrhage, unspecified: Secondary | ICD-10-CM | POA: Diagnosis not present

## 2020-05-22 DIAGNOSIS — I129 Hypertensive chronic kidney disease with stage 1 through stage 4 chronic kidney disease, or unspecified chronic kidney disease: Secondary | ICD-10-CM | POA: Insufficient documentation

## 2020-05-22 DIAGNOSIS — K5520 Angiodysplasia of colon without hemorrhage: Secondary | ICD-10-CM

## 2020-05-22 DIAGNOSIS — G8929 Other chronic pain: Secondary | ICD-10-CM

## 2020-05-22 DIAGNOSIS — D649 Anemia, unspecified: Secondary | ICD-10-CM

## 2020-05-22 DIAGNOSIS — J449 Chronic obstructive pulmonary disease, unspecified: Secondary | ICD-10-CM | POA: Diagnosis not present

## 2020-05-22 DIAGNOSIS — R040 Epistaxis: Secondary | ICD-10-CM | POA: Diagnosis not present

## 2020-05-22 LAB — CBC WITH DIFFERENTIAL (CANCER CENTER ONLY)
Abs Immature Granulocytes: 0.04 10*3/uL (ref 0.00–0.07)
Basophils Absolute: 0 10*3/uL (ref 0.0–0.1)
Basophils Relative: 0 %
Eosinophils Absolute: 0.2 10*3/uL (ref 0.0–0.5)
Eosinophils Relative: 2 %
HCT: 22.5 % — ABNORMAL LOW (ref 36.0–46.0)
Hemoglobin: 6.2 g/dL — CL (ref 12.0–15.0)
Immature Granulocytes: 0 %
Lymphocytes Relative: 8 %
Lymphs Abs: 0.8 10*3/uL (ref 0.7–4.0)
MCH: 23 pg — ABNORMAL LOW (ref 26.0–34.0)
MCHC: 27.6 g/dL — ABNORMAL LOW (ref 30.0–36.0)
MCV: 83.3 fL (ref 80.0–100.0)
Monocytes Absolute: 0.7 10*3/uL (ref 0.1–1.0)
Monocytes Relative: 7 %
Neutro Abs: 8.5 10*3/uL — ABNORMAL HIGH (ref 1.7–7.7)
Neutrophils Relative %: 83 %
Platelet Count: 399 10*3/uL (ref 150–400)
RBC: 2.7 MIL/uL — ABNORMAL LOW (ref 3.87–5.11)
RDW: 18.6 % — ABNORMAL HIGH (ref 11.5–15.5)
WBC Count: 10.4 10*3/uL (ref 4.0–10.5)
nRBC: 0.2 % (ref 0.0–0.2)

## 2020-05-22 LAB — IRON AND TIBC
Iron: 22 ug/dL — ABNORMAL LOW (ref 41–142)
Saturation Ratios: 6 % — ABNORMAL LOW (ref 21–57)
TIBC: 368 ug/dL (ref 236–444)
UIBC: 346 ug/dL (ref 120–384)

## 2020-05-22 LAB — PREPARE RBC (CROSSMATCH)

## 2020-05-22 LAB — FERRITIN: Ferritin: 77 ng/mL (ref 11–307)

## 2020-05-22 LAB — SAMPLE TO BLOOD BANK

## 2020-05-22 MED ORDER — SODIUM CHLORIDE 0.9% IV SOLUTION
250.0000 mL | Freq: Once | INTRAVENOUS | Status: DC
  Filled 2020-05-22: qty 250

## 2020-05-22 MED ORDER — IRON SUCROSE 20 MG/ML IV SOLN
400.0000 mg | Freq: Once | INTRAVENOUS | Status: AC
  Administered 2020-05-22: 400 mg via INTRAVENOUS
  Filled 2020-05-22: qty 20

## 2020-05-22 MED ORDER — FUROSEMIDE 10 MG/ML IJ SOLN
INTRAMUSCULAR | Status: AC
  Filled 2020-05-22: qty 2

## 2020-05-22 MED ORDER — HEPARIN SOD (PORK) LOCK FLUSH 100 UNIT/ML IV SOLN
500.0000 [IU] | Freq: Once | INTRAVENOUS | Status: AC
  Administered 2020-05-22: 500 [IU]
  Filled 2020-05-22: qty 5

## 2020-05-22 MED ORDER — SODIUM CHLORIDE 0.9% FLUSH
10.0000 mL | Freq: Once | INTRAVENOUS | Status: AC
  Administered 2020-05-22: 10 mL
  Filled 2020-05-22: qty 10

## 2020-05-22 MED ORDER — SODIUM CHLORIDE 0.9 % IV SOLN
INTRAVENOUS | Status: DC
  Filled 2020-05-22: qty 250

## 2020-05-22 MED ORDER — FUROSEMIDE 10 MG/ML IJ SOLN: 20.0000 mg | Freq: Once | INTRAMUSCULAR | Status: DC

## 2020-05-22 MED ORDER — ACETAMINOPHEN 325 MG PO TABS
ORAL_TABLET | ORAL | Status: AC
  Filled 2020-05-22: qty 2

## 2020-05-22 MED ORDER — FUROSEMIDE 10 MG/ML IJ SOLN
20.0000 mg | Freq: Once | INTRAMUSCULAR | Status: AC
  Administered 2020-05-22: 20 mg via INTRAVENOUS

## 2020-05-22 MED ORDER — ACETAMINOPHEN 325 MG PO TABS
650.0000 mg | ORAL_TABLET | Freq: Once | ORAL | Status: AC
  Administered 2020-05-22: 650 mg via ORAL

## 2020-05-22 NOTE — Progress Notes (Signed)
Critical Value: Hgb 6.2 Dr Burr Medico notified

## 2020-05-22 NOTE — Patient Instructions (Signed)

## 2020-05-23 LAB — TYPE AND SCREEN
ABO/RH(D): B POS
Antibody Screen: NEGATIVE
Unit division: 0

## 2020-05-23 LAB — BPAM RBC
Blood Product Expiration Date: 202202022359
ISSUE DATE / TIME: 202201071424
Unit Type and Rh: 7300

## 2020-05-26 ENCOUNTER — Telehealth: Payer: Self-pay | Admitting: Hematology

## 2020-05-26 NOTE — Telephone Encounter (Signed)
Scheduled appts per 1/7 los. Pt confirmed appt dates and times.  

## 2020-05-27 ENCOUNTER — Ambulatory Visit (HOSPITAL_COMMUNITY)
Admission: RE | Admit: 2020-05-27 | Discharge: 2020-05-27 | Disposition: A | Discharge status: Home / Self Care | Payer: Medicare Other | Source: Ambulatory Visit | Attending: Student | Admitting: Student

## 2020-05-27 ENCOUNTER — Other Ambulatory Visit: Payer: Self-pay

## 2020-05-27 DIAGNOSIS — Z9889 Other specified postprocedural states: Secondary | ICD-10-CM | POA: Diagnosis not present

## 2020-05-27 DIAGNOSIS — I671 Cerebral aneurysm, nonruptured: Secondary | ICD-10-CM

## 2020-05-28 HISTORY — PX: IR RADIOLOGIST EVAL & MGMT: IMG5224

## 2020-05-29 ENCOUNTER — Inpatient Hospital Stay: Payer: Medicare Other

## 2020-05-29 ENCOUNTER — Other Ambulatory Visit: Payer: Self-pay | Admitting: *Deleted

## 2020-05-29 ENCOUNTER — Other Ambulatory Visit: Payer: Self-pay

## 2020-05-29 VITALS — BP 131/43 | HR 57 | Temp 99.0°F | Resp 18

## 2020-05-29 DIAGNOSIS — D500 Iron deficiency anemia secondary to blood loss (chronic): Secondary | ICD-10-CM

## 2020-05-29 DIAGNOSIS — K5900 Constipation, unspecified: Secondary | ICD-10-CM | POA: Diagnosis not present

## 2020-05-29 DIAGNOSIS — J449 Chronic obstructive pulmonary disease, unspecified: Secondary | ICD-10-CM | POA: Diagnosis not present

## 2020-05-29 DIAGNOSIS — K922 Gastrointestinal hemorrhage, unspecified: Secondary | ICD-10-CM | POA: Diagnosis not present

## 2020-05-29 DIAGNOSIS — K5520 Angiodysplasia of colon without hemorrhage: Secondary | ICD-10-CM

## 2020-05-29 DIAGNOSIS — E1122 Type 2 diabetes mellitus with diabetic chronic kidney disease: Secondary | ICD-10-CM | POA: Diagnosis not present

## 2020-05-29 DIAGNOSIS — I129 Hypertensive chronic kidney disease with stage 1 through stage 4 chronic kidney disease, or unspecified chronic kidney disease: Secondary | ICD-10-CM | POA: Diagnosis not present

## 2020-05-29 DIAGNOSIS — Q2733 Arteriovenous malformation of digestive system vessel: Secondary | ICD-10-CM | POA: Diagnosis not present

## 2020-05-29 DIAGNOSIS — N183 Chronic kidney disease, stage 3 unspecified: Secondary | ICD-10-CM | POA: Diagnosis not present

## 2020-05-29 DIAGNOSIS — D649 Anemia, unspecified: Secondary | ICD-10-CM

## 2020-05-29 DIAGNOSIS — R040 Epistaxis: Secondary | ICD-10-CM | POA: Diagnosis not present

## 2020-05-29 LAB — CMP (CANCER CENTER ONLY)
ALT: 15 U/L (ref 0–44)
AST: 18 U/L (ref 15–41)
Albumin: 3.1 g/dL — ABNORMAL LOW (ref 3.5–5.0)
Alkaline Phosphatase: 135 U/L — ABNORMAL HIGH (ref 38–126)
Anion gap: 5 (ref 5–15)
BUN: 27 mg/dL — ABNORMAL HIGH (ref 8–23)
CO2: 24 mmol/L (ref 22–32)
Calcium: 9.5 mg/dL (ref 8.9–10.3)
Chloride: 109 mmol/L (ref 98–111)
Creatinine: 1.43 mg/dL — ABNORMAL HIGH (ref 0.44–1.00)
GFR, Estimated: 39 mL/min — ABNORMAL LOW (ref >60)
Glucose, Bld: 247 mg/dL — ABNORMAL HIGH (ref 70–99)
Potassium: 4.8 mmol/L (ref 3.5–5.1)
Sodium: 138 mmol/L (ref 135–145)
Total Bilirubin: 0.2 mg/dL — ABNORMAL LOW (ref 0.3–1.2)
Total Protein: 5.9 g/dL — ABNORMAL LOW (ref 6.5–8.1)

## 2020-05-29 LAB — CBC WITH DIFFERENTIAL (CANCER CENTER ONLY)
Abs Immature Granulocytes: 0.04 10*3/uL (ref 0.00–0.07)
Basophils Absolute: 0 10*3/uL (ref 0.0–0.1)
Basophils Relative: 0 %
Eosinophils Absolute: 0.2 10*3/uL (ref 0.0–0.5)
Eosinophils Relative: 2 %
HCT: 24.5 % — ABNORMAL LOW (ref 36.0–46.0)
Hemoglobin: 6.7 g/dL — CL (ref 12.0–15.0)
Immature Granulocytes: 0 %
Lymphocytes Relative: 8 %
Lymphs Abs: 0.8 10*3/uL (ref 0.7–4.0)
MCH: 23.8 pg — ABNORMAL LOW (ref 26.0–34.0)
MCHC: 27.3 g/dL — ABNORMAL LOW (ref 30.0–36.0)
MCV: 86.9 fL (ref 80.0–100.0)
Monocytes Absolute: 0.5 10*3/uL (ref 0.1–1.0)
Monocytes Relative: 6 %
Neutro Abs: 7.9 10*3/uL — ABNORMAL HIGH (ref 1.7–7.7)
Neutrophils Relative %: 84 %
Platelet Count: 277 10*3/uL (ref 150–400)
RBC: 2.82 MIL/uL — ABNORMAL LOW (ref 3.87–5.11)
RDW: 21.6 % — ABNORMAL HIGH (ref 11.5–15.5)
WBC Count: 9.4 10*3/uL (ref 4.0–10.5)
nRBC: 0.3 % — ABNORMAL HIGH (ref 0.0–0.2)

## 2020-05-29 LAB — IRON AND TIBC
Iron: 30 ug/dL — ABNORMAL LOW (ref 41–142)
Saturation Ratios: 9 % — ABNORMAL LOW (ref 21–57)
TIBC: 319 ug/dL (ref 236–444)
UIBC: 289 ug/dL (ref 120–384)

## 2020-05-29 LAB — FERRITIN: Ferritin: 129 ng/mL (ref 11–307)

## 2020-05-29 LAB — SAMPLE TO BLOOD BANK

## 2020-05-29 LAB — PREPARE RBC (CROSSMATCH)

## 2020-05-29 MED ORDER — SODIUM CHLORIDE 0.9% FLUSH
10.0000 mL | Freq: Once | INTRAVENOUS | Status: AC
  Administered 2020-05-29: 10 mL
  Filled 2020-05-29: qty 10

## 2020-05-29 MED ORDER — SODIUM CHLORIDE 0.9 % IV SOLN
INTRAVENOUS | Status: DC
  Filled 2020-05-29: qty 250

## 2020-05-29 MED ORDER — ACETAMINOPHEN 325 MG PO TABS
ORAL_TABLET | ORAL | Status: AC
  Filled 2020-05-29: qty 2

## 2020-05-29 MED ORDER — HEPARIN SOD (PORK) LOCK FLUSH 100 UNIT/ML IV SOLN
500.0000 [IU] | Freq: Once | INTRAVENOUS | Status: DC
  Filled 2020-05-29: qty 5

## 2020-05-29 MED ORDER — ACETAMINOPHEN 325 MG PO TABS
650.0000 mg | ORAL_TABLET | Freq: Once | ORAL | Status: AC
  Administered 2020-05-29: 650 mg via ORAL

## 2020-05-29 MED ORDER — SODIUM CHLORIDE 0.9% IV SOLUTION
250.0000 mL | Freq: Once | INTRAVENOUS | Status: AC
  Administered 2020-05-29: 250 mL via INTRAVENOUS
  Filled 2020-05-29: qty 250

## 2020-05-29 MED ORDER — DIPHENHYDRAMINE HCL 25 MG PO CAPS
25.0000 mg | ORAL_CAPSULE | Freq: Once | ORAL | Status: AC
  Administered 2020-05-29: 25 mg via ORAL

## 2020-05-29 MED ORDER — FUROSEMIDE 10 MG/ML IJ SOLN
20.0000 mg | Freq: Once | INTRAMUSCULAR | Status: AC
  Administered 2020-05-29: 20 mg via INTRAVENOUS

## 2020-05-29 MED ORDER — FUROSEMIDE 10 MG/ML IJ SOLN
INTRAMUSCULAR | Status: AC
  Filled 2020-05-29: qty 2

## 2020-05-29 MED ORDER — SODIUM CHLORIDE 0.9 % IV SOLN
400.0000 mg | Freq: Once | INTRAVENOUS | Status: AC
  Administered 2020-05-29: 400 mg via INTRAVENOUS
  Filled 2020-05-29: qty 20

## 2020-05-29 MED ORDER — DIPHENHYDRAMINE HCL 25 MG PO CAPS
ORAL_CAPSULE | ORAL | Status: AC
  Filled 2020-05-29: qty 1

## 2020-05-29 MED ORDER — SODIUM CHLORIDE 0.9% FLUSH
10.0000 mL | Freq: Once | INTRAVENOUS | Status: DC
  Filled 2020-05-29: qty 10

## 2020-05-29 NOTE — Patient Instructions (Addendum)
Implanted Port Home Guide An implanted port is a device that is placed under the skin. It is usually placed in the chest. The device can be used to give IV medicine, to take blood, or for dialysis. You may have an implanted port if:  You need IV medicine that would be irritating to the small veins in your hands or arms.  You need IV medicines, such as antibiotics, for a long period of time.  You need IV nutrition for a long period of time.  You need dialysis. When you have a port, your health care provider can choose to use the port instead of veins in your arms for these procedures. You may have fewer limitations when using a port than you would if you used other types of long-term IVs, and you will likely be able to return to normal activities after your incision heals. An implanted port has two main parts:  Reservoir. The reservoir is the part where a needle is inserted to give medicines or draw blood. The reservoir is round. After it is placed, it appears as a small, raised area under your skin.  Catheter. The catheter is a thin, flexible tube that connects the reservoir to a vein. Medicine that is inserted into the reservoir goes into the catheter and then into the vein. How is my port accessed? To access your port:  A numbing cream may be placed on the skin over the port site.  Your health care provider will put on a mask and sterile gloves.  The skin over your port will be cleaned carefully with a germ-killing soap and allowed to dry.  Your health care provider will gently pinch the port and insert a needle into it.  Your health care provider will check for a blood return to make sure the port is in the vein and is not clogged.  If your port needs to remain accessed to get medicine continuously (constant infusion), your health care provider will place a clear bandage (dressing) over the needle site. The dressing and needle will need to be changed every week, or as told by your  health care provider. What is flushing? Flushing helps keep the port from getting clogged. Follow instructions from your health care provider about how and when to flush the port. Ports are usually flushed with saline solution or a medicine called heparin. The need for flushing will depend on how the port is used:  If the port is only used from time to time to give medicines or draw blood, the port may need to be flushed: ? Before and after medicines have been given. ? Before and after blood has been drawn. ? As part of routine maintenance. Flushing may be recommended every 4-6 weeks.  If a constant infusion is running, the port may not need to be flushed.  Throw away any syringes in a disposal container that is meant for sharp items (sharps container). You can buy a sharps container from a pharmacy, or you can make one by using an empty hard plastic bottle with a cover. How long will my port stay implanted? The port can stay in for as long as your health care provider thinks it is needed. When it is time for the port to come out, a surgery will be done to remove it. The surgery will be similar to the procedure that was done to put the port in. Follow these instructions at home:  Flush your port as told by your health care   provider.  If you need an infusion over several days, follow instructions from your health care provider about how to take care of your port site. Make sure you: ? Wash your hands with soap and water before you change your dressing. If soap and water are not available, use alcohol-based hand sanitizer. ? Change your dressing as told by your health care provider. ? Place any used dressings or infusion bags into a plastic bag. Throw that bag in the trash. ? Keep the dressing that covers the needle clean and dry. Do not get it wet. ? Do not use scissors or sharp objects near the tube. ? Keep the tube clamped, unless it is being used.  Check your port site every day for signs  of infection. Check for: ? Redness, swelling, or pain. ? Fluid or blood. ? Pus or a bad smell.  Protect the skin around the port site. ? Avoid wearing bra straps that rub or irritate the site. ? Protect the skin around your port from seat belts. Place a soft pad over your chest if needed.  Bathe or shower as told by your health care provider. The site may get wet as long as you are not actively receiving an infusion.  Return to your normal activities as told by your health care provider. Ask your health care provider what activities are safe for you.  Carry a medical alert card or wear a medical alert bracelet at all times. This will let health care providers know that you have an implanted port in case of an emergency.   Get help right away if:  You have redness, swelling, or pain at the port site.  You have fluid or blood coming from your port site.  You have pus or a bad smell coming from the port site.  You have a fever. Summary  Implanted ports are usually placed in the chest for long-term IV access.  Follow instructions from your health care provider about flushing the port and changing bandages (dressings).  Take care of the area around your port by avoiding clothing that puts pressure on the area, and by watching for signs of infection.  Protect the skin around your port from seat belts. Place a soft pad over your chest if needed.  Get help right away if you have a fever or you have redness, swelling, pain, drainage, or a bad smell at the port site. This information is not intended to replace advice given to you by your health care provider. Make sure you discuss any questions you have with your health care provider. Document Revised: 09/16/2019 Document Reviewed: 09/16/2019 Elsevier Patient Education  2021 [place].    Iron Sucrose injection What is this medicine? IRON SUCROSE (AHY ern SOO krohs) is an iron complex. Iron is used to make healthy red blood cells,  which carry oxygen and nutrients throughout the body. This medicine is used to treat iron deficiency anemia in people with chronic kidney disease. This medicine may be used for other purposes; ask your health care provider or pharmacist if you have questions. COMMON BRAND NAME(S): Venofer What should I tell my health care provider before I take this medicine? They need to know if you have any of these conditions:  anemia not caused by low iron levels  heart disease  high levels of iron in the blood  kidney disease  liver disease  an unusual or allergic reaction to iron, other medicines, foods, dyes, or preservatives  pregnant or trying  to get pregnant  breast-feeding How should I use this medicine? This medicine is for infusion into a vein. It is given by a health care professional in a hospital or clinic setting. Talk to your pediatrician regarding the use of this medicine in children. While this drug may be prescribed for children as young as 2 years for selected conditions, precautions do apply. Overdosage: If you think you have taken too much of this medicine contact a poison control center or emergency room at once. NOTE: This medicine is only for you. Do not share this medicine with others. What if I miss a dose? It is important not to miss your dose. Call your doctor or health care professional if you are unable to keep an appointment. What may interact with this medicine? Do not take this medicine with any of the following medications:  deferoxamine  dimercaprol  other iron products This medicine may also interact with the following medications:  chloramphenicol  deferasirox This list may not describe all possible interactions. Give your health care provider a list of all the medicines, herbs, non-prescription drugs, or dietary supplements you use. Also tell them if you smoke, drink alcohol, or use illegal drugs. Some items may interact with your medicine. What  should I watch for while using this medicine? Visit your doctor or healthcare professional regularly. Tell your doctor or healthcare professional if your symptoms do not start to get better or if they get worse. You may need blood work done while you are taking this medicine. You may need to follow a special diet. Talk to your doctor. Foods that contain iron include: whole grains/cereals, dried fruits, beans, or peas, leafy green vegetables, and organ meats (liver, kidney). What side effects may I notice from receiving this medicine? Side effects that you should report to your doctor or health care professional as soon as possible:  allergic reactions like skin rash, itching or hives, swelling of the face, lips, or tongue  breathing problems  changes in blood pressure  cough  fast, irregular heartbeat  feeling faint or lightheaded, falls  fever or chills  flushing, sweating, or hot feelings  joint or muscle aches/pains  seizures  swelling of the ankles or feet  unusually weak or tired Side effects that usually do not require medical attention (report to your doctor or health care professional if they continue or are bothersome):  diarrhea  feeling achy  headache  irritation at site where injected  nausea, vomiting  stomach upset  tiredness This list may not describe all possible side effects. Call your doctor for medical advice about side effects. You may report side effects to FDA at 1-800-FDA-1088. Where should I keep my medicine? This drug is given in a hospital or clinic and will not be stored at home. NOTE: This sheet is a summary. It may not cover all possible information. If you have questions about this medicine, talk to your doctor, pharmacist, or health care provider.  2021 Elsevier/Gold Standard (2011-02-10 17:14:35)   Blood Transfusion, Adult, Care After This sheet gives you information about how to care for yourself after your procedure. Your doctor  may also give you more specific instructions. If you have problems or questions, contact your doctor. What can I expect after the procedure? After the procedure, it is common to have:  Bruising and soreness at the IV site.  A fever or chills on the day of the procedure. This may be your body's response to the new blood cells received.  A headache. Follow these instructions at home: Insertion site care  Follow instructions from your doctor about how to take care of your insertion site. This is where an IV tube was put into your vein. Make sure you: ? Wash your hands with soap and water before and after you change your bandage (dressing). If you cannot use soap and water, use hand sanitizer. ? Change your bandage as told by your doctor.  Check your insertion site every day for signs of infection. Check for: ? Redness, swelling, or pain. ? Bleeding from the site. ? Warmth. ? Pus or a bad smell.      General instructions  Take over-the-counter and prescription medicines only as told by your doctor.  Rest as told by your doctor.  Go back to your normal activities as told by your doctor.  Keep all follow-up visits as told by your doctor. This is important. Contact a doctor if:  You have itching or red, swollen areas of skin (hives).  You feel worried or nervous (anxious).  You feel weak after doing your normal activities.  You have redness, swelling, warmth, or pain around the insertion site.  You have blood coming from the insertion site, and the blood does not stop with pressure.  You have pus or a bad smell coming from the insertion site. Get help right away if:  You have signs of a serious reaction. This may be coming from an allergy or the body's defense system (immune system). Signs include: ? Trouble breathing or shortness of breath. ? Swelling of the face or feeling warm (flushed). ? Fever or chills. ? Head, chest, or back pain. ? Dark pee (urine) or blood in the  pee. ? Widespread rash. ? Fast heartbeat. ? Feeling dizzy or light-headed. You may receive your blood transfusion in an outpatient setting. If so, you will be told whom to contact to report any reactions. These symptoms may be an emergency. Do not wait to see if the symptoms will go away. Get medical help right away. Call your local emergency services (911 in the [place] Do not drive yourself to the hospital. Summary  Bruising and soreness at the IV site are common.  Check your insertion site every day for signs of infection.  Rest as told by your doctor. Go back to your normal activities as told by your doctor.  Get help right away if you have signs of a serious reaction. This information is not intended to replace advice given to you by your health care provider. Make sure you discuss any questions you have with your health care provider. Document Revised: 10/25/2018 Document Reviewed: 10/25/2018 Elsevier Patient Education  [place].

## 2020-05-29 NOTE — Patient Instructions (Signed)

## 2020-05-30 LAB — BPAM RBC
Blood Product Expiration Date: 202201292359
Blood Product Expiration Date: 202202032359
ISSUE DATE / TIME: 202201141316
ISSUE DATE / TIME: 202201141316
Unit Type and Rh: 7300
Unit Type and Rh: 7300

## 2020-05-30 LAB — TYPE AND SCREEN
ABO/RH(D): B POS
Antibody Screen: NEGATIVE
Unit division: 0
Unit division: 0

## 2020-06-03 NOTE — Progress Notes (Signed)
[place]   [phone] Fax:([phone]) [phone]   Clinic Follow up Note   Patient Care Team: Wenda Low, MD as PCP - General (Internal Medicine) O'Neal, Cassie Freer, MD as PCP - Cardiology (Cardiology) Truitt Merle, MD as Consulting Physician (Hematology) Angelia Mould, MD as Consulting Physician (Vascular Surgery) Koleen Distance, MD as Referring Physician (Gastroenterology)  Date of Service:  06/05/2020  CHIEF COMPLAINT: F/u of anemia    CURRENT THERAPY:  IVFeraheme as needed (if ferritin <100). Changed to IV Venofer on 01/02/19. Due to recent GI bleeding, we increased Venofer to every 2 weeks on 05/22/20.   INTERVAL HISTORY:  Bethany [name] is here for a follow up of anemia. She presents to the clinic alone. She notes she started to feel better 1 week ago. She notes she still has stopped Plavix. She notes her energy has improved. She denies SOB or chest pain. She notes her Bm are regular without black or bloody stool. I reviewed medication list with her. She remains on same dose Oxycodone and Xtampza. She notes she was recently out of Lyrica.    REVIEW OF SYSTEMS:   Constitutional: Denies fevers, chills or abnormal weight loss Eyes: Denies blurriness of vision Ears, nose, mouth, throat, and face: Denies mucositis or sore throat Respiratory: Denies cough, dyspnea or wheezes Cardiovascular: Denies palpitation, chest discomfort or lower extremity swelling Gastrointestinal:  Denies nausea, heartburn or change in bowel habits Skin: Denies abnormal skin rashes Lymphatics: Denies new lymphadenopathy or easy bruising Neurological:Denies numbness, tingling or new weaknesses Behavioral/Psych: Mood is stable, no new changes  All other systems were reviewed with the patient and are negative.  MEDICAL HISTORY:  Past Medical History:  Diagnosis Date  . Acute renal failure ([place])   . Anemia   . Arthritis   . COPD (chronic obstructive pulmonary disease)  ([place])   . Diabetes mellitus without complication ([place])    type II   . GERD (gastroesophageal reflux disease)   . Hypercalcemia   . Hyperlipidemia   . Hypertension   . Pneumonia   . Renal disorder   . Single kidney   . Stroke [facility])    July 2021  . Thrombosis   . Tobacco abuse     SURGICAL HISTORY: Past Surgical History:  Procedure Laterality Date  . ABDOMINAL HYSTERECTOMY    . blood clots removed from descending aorta     . CHOLECYSTECTOMY    . COLONOSCOPY    . COLONOSCOPY WITH PROPOFOL N/A 04/17/2017   Procedure: COLONOSCOPY WITH PROPOFOL;  Surgeon: Wilford Corner, MD;  Location: [facility];  Service: Endoscopy;  Laterality: N/A;  . ESOPHAGOGASTRODUODENOSCOPY (EGD) WITH PROPOFOL N/A 04/17/2017   Procedure: ESOPHAGOGASTRODUODENOSCOPY (EGD) WITH PROPOFOL;  Surgeon: Wilford Corner, MD;  Location: [facility];  Service: Endoscopy;  Laterality: N/A;  . IR 3D INDEPENDENT WKST  05/04/2020  . IR ANGIO INTRA EXTRACRAN SEL COM CAROTID INNOMINATE BILAT MOD SED  02/06/2020  . IR ANGIO INTRA EXTRACRAN SEL INTERNAL CAROTID UNI L MOD SED  05/04/2020  . IR ANGIO VERTEBRAL SEL VERTEBRAL BILAT MOD SED  02/06/2020  . IR ANGIOGRAM FOLLOW UP STUDY  05/04/2020  . IR IMAGING GUIDED PORT INSERTION  05/01/2019  . IR NEURO EACH ADD'L AFTER BASIC UNI LEFT (MS)  05/04/2020  . IR RADIOLOGIST EVAL & MGMT  05/28/2020  . IR TRANSCATH/EMBOLIZ  05/04/2020  . IR US GUIDE VASC ACCESS RIGHT  02/06/2020  . IR US GUIDE VASC ACCESS RIGHT  05/04/2020  . NEPHRECTOMY RECIPIENT    .  RADIOLOGY WITH ANESTHESIA N/A 05/04/2020   Procedure: Sheppard Plumber;  Surgeon: Luanne Bras, MD;  Location: [place];  Service: Radiology;  Laterality: N/A;    I have reviewed the social history and family history with the patient and they are unchanged from previous note.  ALLERGIES:  is allergic to contrast media [iodinated diagnostic agents].  MEDICATIONS:  Current Outpatient Medications  Medication Sig Dispense Refill  .  allopurinol (ZYLOPRIM) 100 MG tablet Take 100 mg by mouth at bedtime.    Marland Kitchen amLODipine (NORVASC) 10 MG tablet Take 10 mg by mouth daily.    . APPLE CIDER VINEGAR PO Take 1 tablet by mouth daily.    Marland Kitchen aspirin EC 81 MG EC tablet Take 1 tablet (81 mg total) by mouth daily. Swallow whole. 30 tablet 11  . atorvastatin (LIPITOR) 80 MG tablet Take 1 tablet (80 mg total) by mouth daily. 30 tablet 11  . baclofen (LIORESAL) 10 MG tablet Take 10 mg by mouth 3 (three) times daily as needed for muscle spasms.    . buprenorphine (BUTRANS) 20 MCG/HR PTWK Place 1 patch onto the skin every Monday.     . clopidogrel (PLAVIX) 75 MG tablet Take 75 mg by mouth daily.    . Dulaglutide (TRULICITY) 1.5 JK/9.3OI SOPN Inject 1.5 mg into the skin every Monday.     . ezetimibe (ZETIA) 10 MG tablet Take 10 mg by mouth daily.    . feeding supplement, ENSURE ENLIVE, (ENSURE ENLIVE) LIQD Take 237 mLs by mouth 2 (two) times daily between meals. 237 mL 12  . furosemide (LASIX) 40 MG tablet Take 1 tablet (40 mg total) by mouth daily as needed. (Patient taking differently: Take 40 mg by mouth daily as needed for fluid.) 45 tablet 3  . gabapentin (NEURONTIN) 100 MG capsule Take 2 capsules (200 mg total) by mouth 4 (four) times daily. 240 capsule 3  . insulin glargine, 1 Unit Dial, (TOUJEO SOLOSTAR) 300 UNIT/ML Solostar Pen Inject 20 Units into the skin at bedtime.     . Ipratropium-Albuterol (COMBIVENT) 20-100 MCG/ACT AERS respimat Inhale 1 puff into the lungs every 6 (six) hours as needed for wheezing or shortness of breath. 1 Inhaler 0  . levothyroxine (SYNTHROID) 75 MCG tablet Take 75 mcg by mouth daily.    Marland Kitchen lidocaine-prilocaine (EMLA) cream Apply 1 application topically as needed. (Patient taking differently: Apply 1 application topically daily as needed (port access).) 30 g 1  . losartan (COZAAR) 25 MG tablet Take 1 tablet (25 mg total) by mouth daily. 30 tablet 11  . metoprolol (LOPRESSOR) 50 MG tablet Take 50 mg by mouth 2  (two) times daily.     . Multiple Vitamin (MULTIVITAMIN WITH MINERALS) TABS tablet Take 1 tablet by mouth daily.    Marland Kitchen NARCAN 4 MG/0.1ML LIQD nasal spray kit Place 1 spray into the nose as needed for opioid reversal.    . oxyCODONE-acetaminophen (PERCOCET) 10-325 MG tablet Take 1 tablet by mouth every 6 (six) hours as needed (breakthrough pain).     . pantoprazole (PROTONIX) 40 MG tablet Take 40 mg by mouth every morning.    . pregabalin (LYRICA) 25 MG capsule Take 25 mg by mouth 2 (two) times daily.     . sennosides-docusate sodium (SENOKOT-S) 8.6-50 MG tablet Take 1 tablet by mouth at bedtime.    . Vitamin D, Ergocalciferol, (DRISDOL) 1.25 MG (50000 UNIT) CAPS capsule Take 50,000 Units by mouth every Monday.     Marland Kitchen XTAMPZA ER 27 MG C12A Take  27 mg by mouth 2 (two) times daily.      No current facility-administered medications for this visit.    PHYSICAL EXAMINATION: ECOG PERFORMANCE STATUS: 1 - Symptomatic but completely ambulatory  Due to COVID19 we will limit examination to appearance. Patient had no complaints.  GENERAL:alert, no distress and comfortable SKIN: skin color normal, no rashes or significant lesions EYES: normal, Conjunctiva are pink and non-injected, sclera clear  NEURO: alert & oriented x 3 with fluent speech   LABORATORY DATA:  I have reviewed the data as listed CBC Latest Ref Rng & Units 06/05/2020 05/29/2020 05/22/2020  WBC 4.0 - 10.5 K/uL 11.2(H) 9.4 10.4  Hemoglobin 12.0 - 15.0 g/dL 8.8(L) 6.7(LL) 6.2(LL)  Hematocrit 36.0 - 46.0 % 30.5(L) 24.5(L) 22.5(L)  Platelets 150 - 400 K/uL 191 277 399     CMP Latest Ref Rng & Units 05/29/2020 05/08/2020 05/05/2020  Glucose 70 - 99 mg/dL 247(H) 219(H) 106(H)  BUN 8 - 23 mg/dL 27(H) 15 22  Creatinine 0.44 - 1.00 mg/dL 1.43(H) 1.08(H) 1.12(H)  Sodium 135 - 145 mmol/L 138 139 140  Potassium 3.5 - 5.1 mmol/L 4.8 4.4 4.4  Chloride 98 - 111 mmol/L 109 108 109  CO2 22 - 32 mmol/L 24 24 21(L)  Calcium 8.9 - 10.3 mg/dL 9.5 9.9  8.8(L)  Total Protein 6.5 - 8.1 g/dL 5.9(L) - -  Total Bilirubin 0.3 - 1.2 mg/dL 0.2(L) - -  Alkaline Phos 38 - 126 U/L 135(H) - -  AST 15 - 41 U/L 18 - -  ALT 0 - 44 U/L 15 - -      RADIOGRAPHIC STUDIES: I have personally reviewed the radiological images as listed and agreed with the findings in the report. No results found.   ASSESSMENT & PLAN:  Bethany [name] is a 70 y.o. female with    1. 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 She has required blood transfusions in the past in 2013 and in 2018 and frequent hospital admission for anemia. -She has intermittent epistaxis, but no skin or mucosa telangiectasia, no significant family history of bleeding disorder, she does not meet the diagnosis criteria for hereditary hemorrhagic telangiectasia. -Her lastcolonoscopy/endoscopy was 04/2017. -She is not on oral iron, due to the lack of benefit. -Currently being treated withIV Iron to prevent significant anemiawith goal of Ferritin 100-200 range. She was switched to IV Venofer on 8/19/20due to her insurance coverage.Increased to 431mwith each infusionfrom 02/14/20.Due to recent GI bleeding, we increased Venofer to every 1-2 weeks on 05/22/20.  -Her recent severe anemia has improved with frequent IV Iron and holding Plavix. Energy has improved as well. Labs reviewed today, WBC 11.2, Hg 8.8, ANC 9.8, ferritin 228, Iron 95. Proceed with IV Venofer today, no blood transfusion.  -Continue4051menoferevery2 weeks. Will hold Venofer if previous ferritin>300 -F/u in6 weeks   2. Chronic GI Bleeding, intermittent epistaxis, constipation -Has intestinal AVM history  -Lastcolonoscopy/EGD by Dr. ScMichail Sermonn 04/17/17 showed gastritis, diverticulosis and internal hemorrhoids, all benign with no obvious signs of bleeding at that time. -She does  not meet the diagnosis criteria for hereditary hemorrhagic telangiectasia (HHT) -She [facility]After [facility]/2020 hospitalization she was initially constipated then has had black stool since. With holding Plavix, She has less black stool. She is fine to continue baby aspirin.  3. H/o of arterial thrombosis,Ischemic stroke 11/2019, Left Brain Aneurysm (12m and 263m -S/p unilateral nephrectomy due to damage fromarterialthrombosis -Managed by vascular physician, Dr. DiJoyce Grosseurologist Dr SeLeonie ManPrevious work-up for lupus anticoagulantand antiphospholipid syndromewerenegative, normal protein S and C level. -She notes she still has mild speech deficit (difficulty finding her words).  -On 02/06/20 Dr DeAllegra Granaound a second brain aneurysm. She underwent embolization surgery on 05/04/20  -After 04/2020 procedure she has restarted baby aspirin and Plavix until her next neuro f/u. Given increased GI bleeding and worsened anemia, we have held Plavix since 05/22/20. She is fine to continue baby aspirin.   4. Smoking Cessation, Lung Nodules -Currently smokes 1.5packs over 2 days currently. She has been smoking for 40 years. -IJaneal Holmesepeatedly encouraged her to continue to try to quit completely, she is willing to try.  -Her CT Chest from 07/12/19 shows Stable 7 mm nodule in left lower lobe. Also shows New 4 mm nodule is noted in left upper lobe and 12 mm sub solid density is noted in left lung apex.   5. COPD, HTN, CKD stage III,DM with Peripheral Neuropathy,Chronic Back Pain  -She is currently on 60081mabapentin 4 times a day. -[facility]   6. Cancer Screening  -Her 07/12/19 CT chest also showed 24m59mss right breast mass which Pt notes she has had for years. I strongly encouraged her to have annual mammogram for better  screening.   PLAN: -Proceed with Venofer400mg28may, no blood transfusion -Lab, flush, and IV Venofer 400mg 78my 2 weeks for 6 weeks, will hold Venofer if previous ferritin>300 -she will continue holding plavix, OK to continue aspirin  -f/u in6 weeks.    No problem-specific Assessment & Plan notes found for this encounter.   No orders of the defined types were placed in this encounter.  All questions were answered. The patient knows to call the clinic with any problems, questions or concerns. No barriers to learning was detected. The total time spent in the appointment was 20 minutes.     Jensyn Shave FeTruitt Merle/21/2022   I, Amoya Joslyn Devoncting as scribe for Chace Bisch FeTruitt Merle  I have reviewed the above documentation for accuracy and completeness, and I agree with the above.

## 2020-06-04 DIAGNOSIS — D500 Iron deficiency anemia secondary to blood loss (chronic): Secondary | ICD-10-CM | POA: Diagnosis not present

## 2020-06-04 DIAGNOSIS — Z7982 Long term (current) use of aspirin: Secondary | ICD-10-CM | POA: Diagnosis not present

## 2020-06-04 DIAGNOSIS — K922 Gastrointestinal hemorrhage, unspecified: Secondary | ICD-10-CM | POA: Diagnosis not present

## 2020-06-04 DIAGNOSIS — M170 Bilateral primary osteoarthritis of knee: Secondary | ICD-10-CM | POA: Diagnosis not present

## 2020-06-04 DIAGNOSIS — Z48812 Encounter for surgical aftercare following surgery on the circulatory system: Secondary | ICD-10-CM | POA: Diagnosis not present

## 2020-06-04 DIAGNOSIS — E119 Type 2 diabetes mellitus without complications: Secondary | ICD-10-CM | POA: Diagnosis not present

## 2020-06-04 DIAGNOSIS — I10 Essential (primary) hypertension: Secondary | ICD-10-CM | POA: Diagnosis not present

## 2020-06-04 DIAGNOSIS — K219 Gastro-esophageal reflux disease without esophagitis: Secondary | ICD-10-CM | POA: Diagnosis not present

## 2020-06-04 DIAGNOSIS — M25512 Pain in left shoulder: Secondary | ICD-10-CM | POA: Diagnosis not present

## 2020-06-04 DIAGNOSIS — F17210 Nicotine dependence, cigarettes, uncomplicated: Secondary | ICD-10-CM | POA: Diagnosis not present

## 2020-06-04 DIAGNOSIS — Z86718 Personal history of other venous thrombosis and embolism: Secondary | ICD-10-CM | POA: Diagnosis not present

## 2020-06-04 DIAGNOSIS — M542 Cervicalgia: Secondary | ICD-10-CM | POA: Diagnosis not present

## 2020-06-04 DIAGNOSIS — J449 Chronic obstructive pulmonary disease, unspecified: Secondary | ICD-10-CM | POA: Diagnosis not present

## 2020-06-04 DIAGNOSIS — G8929 Other chronic pain: Secondary | ICD-10-CM | POA: Diagnosis not present

## 2020-06-04 DIAGNOSIS — K5900 Constipation, unspecified: Secondary | ICD-10-CM | POA: Diagnosis not present

## 2020-06-04 DIAGNOSIS — E785 Hyperlipidemia, unspecified: Secondary | ICD-10-CM | POA: Diagnosis not present

## 2020-06-04 DIAGNOSIS — Z7902 Long term (current) use of antithrombotics/antiplatelets: Secondary | ICD-10-CM | POA: Diagnosis not present

## 2020-06-04 DIAGNOSIS — Z794 Long term (current) use of insulin: Secondary | ICD-10-CM | POA: Diagnosis not present

## 2020-06-04 DIAGNOSIS — M479 Spondylosis, unspecified: Secondary | ICD-10-CM | POA: Diagnosis not present

## 2020-06-04 DIAGNOSIS — Z9181 History of falling: Secondary | ICD-10-CM | POA: Diagnosis not present

## 2020-06-04 DIAGNOSIS — J90 Pleural effusion, not elsewhere classified: Secondary | ICD-10-CM | POA: Diagnosis not present

## 2020-06-04 DIAGNOSIS — G9340 Encephalopathy, unspecified: Secondary | ICD-10-CM | POA: Diagnosis not present

## 2020-06-05 ENCOUNTER — Encounter: Payer: Self-pay | Admitting: Hematology

## 2020-06-05 ENCOUNTER — Other Ambulatory Visit: Payer: Self-pay

## 2020-06-05 ENCOUNTER — Other Ambulatory Visit: Payer: Medicare Other

## 2020-06-05 ENCOUNTER — Inpatient Hospital Stay: Payer: Medicare Other

## 2020-06-05 ENCOUNTER — Inpatient Hospital Stay (HOSPITAL_BASED_OUTPATIENT_CLINIC_OR_DEPARTMENT_OTHER): Payer: Medicare Other | Admitting: Hematology

## 2020-06-05 ENCOUNTER — Ambulatory Visit: Payer: Medicare Other | Admitting: Hematology

## 2020-06-05 ENCOUNTER — Ambulatory Visit: Payer: Medicare Other

## 2020-06-05 VITALS — BP 138/62 | HR 62 | Temp 98.2°F | Resp 18

## 2020-06-05 DIAGNOSIS — Q2733 Arteriovenous malformation of digestive system vessel: Secondary | ICD-10-CM | POA: Diagnosis not present

## 2020-06-05 DIAGNOSIS — R040 Epistaxis: Secondary | ICD-10-CM | POA: Diagnosis not present

## 2020-06-05 DIAGNOSIS — D509 Iron deficiency anemia, unspecified: Secondary | ICD-10-CM | POA: Diagnosis not present

## 2020-06-05 DIAGNOSIS — J449 Chronic obstructive pulmonary disease, unspecified: Secondary | ICD-10-CM | POA: Diagnosis not present

## 2020-06-05 DIAGNOSIS — K5520 Angiodysplasia of colon without hemorrhage: Secondary | ICD-10-CM

## 2020-06-05 DIAGNOSIS — I129 Hypertensive chronic kidney disease with stage 1 through stage 4 chronic kidney disease, or unspecified chronic kidney disease: Secondary | ICD-10-CM | POA: Diagnosis not present

## 2020-06-05 DIAGNOSIS — E1122 Type 2 diabetes mellitus with diabetic chronic kidney disease: Secondary | ICD-10-CM | POA: Diagnosis not present

## 2020-06-05 DIAGNOSIS — E7800 Pure hypercholesterolemia, unspecified: Secondary | ICD-10-CM | POA: Diagnosis not present

## 2020-06-05 DIAGNOSIS — I509 Heart failure, unspecified: Secondary | ICD-10-CM | POA: Diagnosis not present

## 2020-06-05 DIAGNOSIS — N183 Chronic kidney disease, stage 3 unspecified: Secondary | ICD-10-CM | POA: Diagnosis not present

## 2020-06-05 DIAGNOSIS — D500 Iron deficiency anemia secondary to blood loss (chronic): Secondary | ICD-10-CM

## 2020-06-05 DIAGNOSIS — D649 Anemia, unspecified: Secondary | ICD-10-CM

## 2020-06-05 DIAGNOSIS — K5900 Constipation, unspecified: Secondary | ICD-10-CM | POA: Diagnosis not present

## 2020-06-05 DIAGNOSIS — N1831 Chronic kidney disease, stage 3a: Secondary | ICD-10-CM | POA: Diagnosis not present

## 2020-06-05 DIAGNOSIS — K219 Gastro-esophageal reflux disease without esophagitis: Secondary | ICD-10-CM | POA: Diagnosis not present

## 2020-06-05 DIAGNOSIS — G459 Transient cerebral ischemic attack, unspecified: Secondary | ICD-10-CM | POA: Diagnosis not present

## 2020-06-05 DIAGNOSIS — K922 Gastrointestinal hemorrhage, unspecified: Secondary | ICD-10-CM | POA: Diagnosis not present

## 2020-06-05 DIAGNOSIS — I10 Essential (primary) hypertension: Secondary | ICD-10-CM | POA: Diagnosis not present

## 2020-06-05 DIAGNOSIS — E039 Hypothyroidism, unspecified: Secondary | ICD-10-CM | POA: Diagnosis not present

## 2020-06-05 DIAGNOSIS — E1140 Type 2 diabetes mellitus with diabetic neuropathy, unspecified: Secondary | ICD-10-CM | POA: Diagnosis not present

## 2020-06-05 DIAGNOSIS — I639 Cerebral infarction, unspecified: Secondary | ICD-10-CM | POA: Diagnosis not present

## 2020-06-05 LAB — CBC WITH DIFFERENTIAL (CANCER CENTER ONLY)
Abs Immature Granulocytes: 0.04 10*3/uL (ref 0.00–0.07)
Basophils Absolute: 0 10*3/uL (ref 0.0–0.1)
Basophils Relative: 0 %
Eosinophils Absolute: 0.1 10*3/uL (ref 0.0–0.5)
Eosinophils Relative: 1 %
HCT: 30.5 % — ABNORMAL LOW (ref 36.0–46.0)
Hemoglobin: 8.8 g/dL — ABNORMAL LOW (ref 12.0–15.0)
Immature Granulocytes: 0 %
Lymphocytes Relative: 6 %
Lymphs Abs: 0.7 10*3/uL (ref 0.7–4.0)
MCH: 25.1 pg — ABNORMAL LOW (ref 26.0–34.0)
MCHC: 28.9 g/dL — ABNORMAL LOW (ref 30.0–36.0)
MCV: 86.9 fL (ref 80.0–100.0)
Monocytes Absolute: 0.5 10*3/uL (ref 0.1–1.0)
Monocytes Relative: 4 %
Neutro Abs: 9.8 10*3/uL — ABNORMAL HIGH (ref 1.7–7.7)
Neutrophils Relative %: 89 %
Platelet Count: 191 10*3/uL (ref 150–400)
RBC: 3.51 MIL/uL — ABNORMAL LOW (ref 3.87–5.11)
RDW: 19.6 % — ABNORMAL HIGH (ref 11.5–15.5)
WBC Count: 11.2 10*3/uL — ABNORMAL HIGH (ref 4.0–10.5)
nRBC: 0 % (ref 0.0–0.2)

## 2020-06-05 LAB — IRON AND TIBC
Iron: 95 ug/dL (ref 41–142)
Saturation Ratios: 30 % (ref 21–57)
TIBC: 319 ug/dL (ref 236–444)
UIBC: 224 ug/dL (ref 120–384)

## 2020-06-05 LAB — FERRITIN: Ferritin: 228 ng/mL (ref 11–307)

## 2020-06-05 LAB — SAMPLE TO BLOOD BANK

## 2020-06-05 MED ORDER — SODIUM CHLORIDE 0.9 % IV SOLN
400.0000 mg | Freq: Once | INTRAVENOUS | Status: AC
  Administered 2020-06-05: 400 mg via INTRAVENOUS
  Filled 2020-06-05: qty 20

## 2020-06-05 MED ORDER — SODIUM CHLORIDE 0.9% FLUSH
10.0000 mL | Freq: Once | INTRAVENOUS | Status: AC
  Administered 2020-06-05: 10 mL
  Filled 2020-06-05: qty 10

## 2020-06-05 NOTE — Patient Instructions (Signed)

## 2020-06-05 NOTE — Progress Notes (Signed)
Pt discharged in no apparent distress. Pt left ambulatory without assistance.  Pt aware of discharge instructions and verbalized understanding and had no further questions. She refused post 30 minute iron wait but VSS upon leaving

## 2020-06-07 ENCOUNTER — Encounter: Payer: Self-pay | Admitting: Hematology

## 2020-06-08 ENCOUNTER — Telehealth: Payer: Self-pay | Admitting: Hematology

## 2020-06-08 DIAGNOSIS — Z9889 Other specified postprocedural states: Secondary | ICD-10-CM | POA: Diagnosis not present

## 2020-06-08 DIAGNOSIS — Z8679 Personal history of other diseases of the circulatory system: Secondary | ICD-10-CM | POA: Diagnosis not present

## 2020-06-08 DIAGNOSIS — I639 Cerebral infarction, unspecified: Secondary | ICD-10-CM | POA: Diagnosis not present

## 2020-06-08 DIAGNOSIS — N1831 Chronic kidney disease, stage 3a: Secondary | ICD-10-CM | POA: Diagnosis not present

## 2020-06-08 DIAGNOSIS — I10 Essential (primary) hypertension: Secondary | ICD-10-CM | POA: Diagnosis not present

## 2020-06-08 DIAGNOSIS — E1140 Type 2 diabetes mellitus with diabetic neuropathy, unspecified: Secondary | ICD-10-CM | POA: Diagnosis not present

## 2020-06-08 NOTE — Telephone Encounter (Signed)
Scheduled appts per 1/21 los. Left voicemail with appt date and time. Pt advised to refer to mychart.

## 2020-06-16 DIAGNOSIS — I10 Essential (primary) hypertension: Secondary | ICD-10-CM | POA: Diagnosis not present

## 2020-06-16 DIAGNOSIS — Z7902 Long term (current) use of antithrombotics/antiplatelets: Secondary | ICD-10-CM | POA: Diagnosis not present

## 2020-06-16 DIAGNOSIS — Z794 Long term (current) use of insulin: Secondary | ICD-10-CM | POA: Diagnosis not present

## 2020-06-16 DIAGNOSIS — K5900 Constipation, unspecified: Secondary | ICD-10-CM | POA: Diagnosis not present

## 2020-06-16 DIAGNOSIS — Z86718 Personal history of other venous thrombosis and embolism: Secondary | ICD-10-CM | POA: Diagnosis not present

## 2020-06-16 DIAGNOSIS — E119 Type 2 diabetes mellitus without complications: Secondary | ICD-10-CM | POA: Diagnosis not present

## 2020-06-16 DIAGNOSIS — K922 Gastrointestinal hemorrhage, unspecified: Secondary | ICD-10-CM | POA: Diagnosis not present

## 2020-06-16 DIAGNOSIS — E785 Hyperlipidemia, unspecified: Secondary | ICD-10-CM | POA: Diagnosis not present

## 2020-06-16 DIAGNOSIS — M542 Cervicalgia: Secondary | ICD-10-CM | POA: Diagnosis not present

## 2020-06-16 DIAGNOSIS — K219 Gastro-esophageal reflux disease without esophagitis: Secondary | ICD-10-CM | POA: Diagnosis not present

## 2020-06-16 DIAGNOSIS — G9340 Encephalopathy, unspecified: Secondary | ICD-10-CM | POA: Diagnosis not present

## 2020-06-16 DIAGNOSIS — Z7982 Long term (current) use of aspirin: Secondary | ICD-10-CM | POA: Diagnosis not present

## 2020-06-16 DIAGNOSIS — J449 Chronic obstructive pulmonary disease, unspecified: Secondary | ICD-10-CM | POA: Diagnosis not present

## 2020-06-16 DIAGNOSIS — M479 Spondylosis, unspecified: Secondary | ICD-10-CM | POA: Diagnosis not present

## 2020-06-16 DIAGNOSIS — M170 Bilateral primary osteoarthritis of knee: Secondary | ICD-10-CM | POA: Diagnosis not present

## 2020-06-16 DIAGNOSIS — M25512 Pain in left shoulder: Secondary | ICD-10-CM | POA: Diagnosis not present

## 2020-06-16 DIAGNOSIS — D500 Iron deficiency anemia secondary to blood loss (chronic): Secondary | ICD-10-CM | POA: Diagnosis not present

## 2020-06-16 DIAGNOSIS — Z48812 Encounter for surgical aftercare following surgery on the circulatory system: Secondary | ICD-10-CM | POA: Diagnosis not present

## 2020-06-16 DIAGNOSIS — J90 Pleural effusion, not elsewhere classified: Secondary | ICD-10-CM | POA: Diagnosis not present

## 2020-06-16 DIAGNOSIS — F17210 Nicotine dependence, cigarettes, uncomplicated: Secondary | ICD-10-CM | POA: Diagnosis not present

## 2020-06-16 DIAGNOSIS — G8929 Other chronic pain: Secondary | ICD-10-CM | POA: Diagnosis not present

## 2020-06-16 DIAGNOSIS — Z9181 History of falling: Secondary | ICD-10-CM | POA: Diagnosis not present

## 2020-06-19 ENCOUNTER — Inpatient Hospital Stay: Payer: Medicare Other | Attending: Hematology

## 2020-06-19 ENCOUNTER — Inpatient Hospital Stay: Payer: Medicare Other

## 2020-06-19 ENCOUNTER — Other Ambulatory Visit: Payer: Self-pay

## 2020-06-19 ENCOUNTER — Ambulatory Visit: Payer: Medicare Other | Admitting: Hematology

## 2020-06-19 ENCOUNTER — Other Ambulatory Visit: Payer: Medicare Other

## 2020-06-19 VITALS — BP 130/53 | HR 59 | Temp 98.9°F | Resp 18 | Wt 176.2 lb

## 2020-06-19 DIAGNOSIS — D500 Iron deficiency anemia secondary to blood loss (chronic): Secondary | ICD-10-CM | POA: Insufficient documentation

## 2020-06-19 DIAGNOSIS — K922 Gastrointestinal hemorrhage, unspecified: Secondary | ICD-10-CM | POA: Insufficient documentation

## 2020-06-19 DIAGNOSIS — K5520 Angiodysplasia of colon without hemorrhage: Secondary | ICD-10-CM

## 2020-06-19 DIAGNOSIS — Q2733 Arteriovenous malformation of digestive system vessel: Secondary | ICD-10-CM | POA: Diagnosis not present

## 2020-06-19 DIAGNOSIS — D649 Anemia, unspecified: Secondary | ICD-10-CM

## 2020-06-19 LAB — SAMPLE TO BLOOD BANK

## 2020-06-19 LAB — CBC WITH DIFFERENTIAL (CANCER CENTER ONLY)
Abs Immature Granulocytes: 0.02 10*3/uL (ref 0.00–0.07)
Basophils Absolute: 0 10*3/uL (ref 0.0–0.1)
Basophils Relative: 0 %
Eosinophils Absolute: 0.2 10*3/uL (ref 0.0–0.5)
Eosinophils Relative: 2 %
HCT: 33.3 % — ABNORMAL LOW (ref 36.0–46.0)
Hemoglobin: 9.8 g/dL — ABNORMAL LOW (ref 12.0–15.0)
Immature Granulocytes: 0 %
Lymphocytes Relative: 15 %
Lymphs Abs: 1.2 10*3/uL (ref 0.7–4.0)
MCH: 24.6 pg — ABNORMAL LOW (ref 26.0–34.0)
MCHC: 29.4 g/dL — ABNORMAL LOW (ref 30.0–36.0)
MCV: 83.7 fL (ref 80.0–100.0)
Monocytes Absolute: 0.6 10*3/uL (ref 0.1–1.0)
Monocytes Relative: 7 %
Neutro Abs: 6 10*3/uL (ref 1.7–7.7)
Neutrophils Relative %: 76 %
Platelet Count: 320 10*3/uL (ref 150–400)
RBC: 3.98 MIL/uL (ref 3.87–5.11)
RDW: 17.8 % — ABNORMAL HIGH (ref 11.5–15.5)
WBC Count: 8 10*3/uL (ref 4.0–10.5)
nRBC: 0 % (ref 0.0–0.2)

## 2020-06-19 LAB — IRON AND TIBC
Iron: 45 ug/dL (ref 41–142)
Saturation Ratios: 14 % — ABNORMAL LOW (ref 21–57)
TIBC: 329 ug/dL (ref 236–444)
UIBC: 284 ug/dL (ref 120–384)

## 2020-06-19 LAB — FERRITIN: Ferritin: 204 ng/mL (ref 11–307)

## 2020-06-19 MED ORDER — SODIUM CHLORIDE 0.9 % IV SOLN
400.0000 mg | Freq: Once | INTRAVENOUS | Status: AC
  Administered 2020-06-19: 400 mg via INTRAVENOUS
  Filled 2020-06-19: qty 10

## 2020-06-19 MED ORDER — SODIUM CHLORIDE 0.9% FLUSH
10.0000 mL | Freq: Once | INTRAVENOUS | Status: AC
  Administered 2020-06-19: 10 mL
  Filled 2020-06-19: qty 10

## 2020-06-19 MED ORDER — HEPARIN SOD (PORK) LOCK FLUSH 100 UNIT/ML IV SOLN
500.0000 [IU] | Freq: Once | INTRAVENOUS | Status: AC
  Administered 2020-06-19: 500 [IU]
  Filled 2020-06-19: qty 5

## 2020-06-19 NOTE — Patient Instructions (Signed)

## 2020-06-22 DIAGNOSIS — Z9181 History of falling: Secondary | ICD-10-CM | POA: Diagnosis not present

## 2020-06-22 DIAGNOSIS — Z87891 Personal history of nicotine dependence: Secondary | ICD-10-CM | POA: Diagnosis not present

## 2020-06-22 DIAGNOSIS — G894 Chronic pain syndrome: Secondary | ICD-10-CM | POA: Diagnosis not present

## 2020-06-22 DIAGNOSIS — M5137 Other intervertebral disc degeneration, lumbosacral region: Secondary | ICD-10-CM | POA: Diagnosis not present

## 2020-06-22 DIAGNOSIS — F17210 Nicotine dependence, cigarettes, uncomplicated: Secondary | ICD-10-CM | POA: Diagnosis not present

## 2020-06-22 DIAGNOSIS — M79604 Pain in right leg: Secondary | ICD-10-CM | POA: Diagnosis not present

## 2020-06-22 DIAGNOSIS — Z79899 Other long term (current) drug therapy: Secondary | ICD-10-CM | POA: Diagnosis not present

## 2020-06-22 DIAGNOSIS — M79605 Pain in left leg: Secondary | ICD-10-CM | POA: Diagnosis not present

## 2020-06-24 ENCOUNTER — Other Ambulatory Visit: Payer: Self-pay | Admitting: Internal Medicine

## 2020-06-24 DIAGNOSIS — E559 Vitamin D deficiency, unspecified: Secondary | ICD-10-CM | POA: Diagnosis not present

## 2020-06-24 DIAGNOSIS — I10 Essential (primary) hypertension: Secondary | ICD-10-CM | POA: Diagnosis not present

## 2020-06-24 DIAGNOSIS — G8929 Other chronic pain: Secondary | ICD-10-CM | POA: Diagnosis not present

## 2020-06-24 DIAGNOSIS — E1140 Type 2 diabetes mellitus with diabetic neuropathy, unspecified: Secondary | ICD-10-CM | POA: Diagnosis not present

## 2020-06-24 DIAGNOSIS — M8580 Other specified disorders of bone density and structure, unspecified site: Secondary | ICD-10-CM | POA: Diagnosis not present

## 2020-06-24 DIAGNOSIS — I700 Atherosclerosis of aorta: Secondary | ICD-10-CM | POA: Diagnosis not present

## 2020-06-24 DIAGNOSIS — D509 Iron deficiency anemia, unspecified: Secondary | ICD-10-CM | POA: Diagnosis not present

## 2020-06-24 DIAGNOSIS — Z1389 Encounter for screening for other disorder: Secondary | ICD-10-CM | POA: Diagnosis not present

## 2020-06-24 DIAGNOSIS — I739 Peripheral vascular disease, unspecified: Secondary | ICD-10-CM | POA: Diagnosis not present

## 2020-06-24 DIAGNOSIS — R911 Solitary pulmonary nodule: Secondary | ICD-10-CM

## 2020-06-24 DIAGNOSIS — G629 Polyneuropathy, unspecified: Secondary | ICD-10-CM | POA: Diagnosis not present

## 2020-06-24 DIAGNOSIS — I639 Cerebral infarction, unspecified: Secondary | ICD-10-CM | POA: Diagnosis not present

## 2020-06-24 DIAGNOSIS — Z0000 Encounter for general adult medical examination without abnormal findings: Secondary | ICD-10-CM | POA: Diagnosis not present

## 2020-06-24 DIAGNOSIS — J449 Chronic obstructive pulmonary disease, unspecified: Secondary | ICD-10-CM | POA: Diagnosis not present

## 2020-06-24 DIAGNOSIS — I509 Heart failure, unspecified: Secondary | ICD-10-CM | POA: Diagnosis not present

## 2020-06-24 DIAGNOSIS — Z1231 Encounter for screening mammogram for malignant neoplasm of breast: Secondary | ICD-10-CM

## 2020-06-25 ENCOUNTER — Other Ambulatory Visit: Payer: Self-pay | Admitting: Internal Medicine

## 2020-06-25 DIAGNOSIS — R911 Solitary pulmonary nodule: Secondary | ICD-10-CM

## 2020-07-03 ENCOUNTER — Inpatient Hospital Stay: Payer: Medicare Other

## 2020-07-03 ENCOUNTER — Other Ambulatory Visit: Payer: Self-pay

## 2020-07-03 VITALS — BP 143/52 | HR 56 | Temp 97.9°F | Resp 18 | Wt 177.2 lb

## 2020-07-03 DIAGNOSIS — K922 Gastrointestinal hemorrhage, unspecified: Secondary | ICD-10-CM | POA: Diagnosis not present

## 2020-07-03 DIAGNOSIS — D500 Iron deficiency anemia secondary to blood loss (chronic): Secondary | ICD-10-CM

## 2020-07-03 DIAGNOSIS — D649 Anemia, unspecified: Secondary | ICD-10-CM

## 2020-07-03 DIAGNOSIS — K5520 Angiodysplasia of colon without hemorrhage: Secondary | ICD-10-CM

## 2020-07-03 DIAGNOSIS — Q2733 Arteriovenous malformation of digestive system vessel: Secondary | ICD-10-CM | POA: Diagnosis not present

## 2020-07-03 LAB — IRON AND TIBC
Iron: 63 ug/dL (ref 41–142)
Saturation Ratios: 19 % — ABNORMAL LOW (ref 21–57)
TIBC: 326 ug/dL (ref 236–444)
UIBC: 263 ug/dL (ref 120–384)

## 2020-07-03 LAB — CBC WITH DIFFERENTIAL (CANCER CENTER ONLY)
Abs Immature Granulocytes: 0.06 10*3/uL (ref 0.00–0.07)
Basophils Absolute: 0 10*3/uL (ref 0.0–0.1)
Basophils Relative: 0 %
Eosinophils Absolute: 0.2 10*3/uL (ref 0.0–0.5)
Eosinophils Relative: 2 %
HCT: 34.5 % — ABNORMAL LOW (ref 36.0–46.0)
Hemoglobin: 10.1 g/dL — ABNORMAL LOW (ref 12.0–15.0)
Immature Granulocytes: 1 %
Lymphocytes Relative: 13 %
Lymphs Abs: 1.2 10*3/uL (ref 0.7–4.0)
MCH: 24.3 pg — ABNORMAL LOW (ref 26.0–34.0)
MCHC: 29.3 g/dL — ABNORMAL LOW (ref 30.0–36.0)
MCV: 82.9 fL (ref 80.0–100.0)
Monocytes Absolute: 0.7 10*3/uL (ref 0.1–1.0)
Monocytes Relative: 8 %
Neutro Abs: 6.7 10*3/uL (ref 1.7–7.7)
Neutrophils Relative %: 76 %
Platelet Count: 210 10*3/uL (ref 150–400)
RBC: 4.16 MIL/uL (ref 3.87–5.11)
RDW: 17 % — ABNORMAL HIGH (ref 11.5–15.5)
WBC Count: 8.8 10*3/uL (ref 4.0–10.5)
nRBC: 0 % (ref 0.0–0.2)

## 2020-07-03 LAB — SAMPLE TO BLOOD BANK

## 2020-07-03 LAB — FERRITIN: Ferritin: 261 ng/mL (ref 11–307)

## 2020-07-03 MED ORDER — ACETAMINOPHEN 325 MG PO TABS
ORAL_TABLET | ORAL | Status: AC
  Filled 2020-07-03: qty 2

## 2020-07-03 MED ORDER — IRON SUCROSE 20 MG/ML IV SOLN
400.0000 mg | Freq: Once | INTRAVENOUS | Status: AC
  Administered 2020-07-03: 400 mg via INTRAVENOUS
  Filled 2020-07-03: qty 20

## 2020-07-03 MED ORDER — SODIUM CHLORIDE 0.9% FLUSH
10.0000 mL | Freq: Once | INTRAVENOUS | Status: AC
  Administered 2020-07-03: 10 mL
  Filled 2020-07-03: qty 10

## 2020-07-03 MED ORDER — HEPARIN SOD (PORK) LOCK FLUSH 100 UNIT/ML IV SOLN
500.0000 [IU] | Freq: Once | INTRAVENOUS | Status: AC
  Administered 2020-07-03: 500 [IU]
  Filled 2020-07-03: qty 5

## 2020-07-03 MED ORDER — SODIUM CHLORIDE 0.9 % IV SOLN
Freq: Once | INTRAVENOUS | Status: AC
  Filled 2020-07-03: qty 250

## 2020-07-03 MED ORDER — LORATADINE 10 MG PO TABS
ORAL_TABLET | ORAL | Status: AC
  Filled 2020-07-03: qty 1

## 2020-07-03 NOTE — Patient Instructions (Signed)
Iron Dextran injection °What is this medicine? °IRON DEXTRAN (AHY ern DEX tran) is an iron complex. Iron is used to make healthy red blood cells, which carry oxygen and nutrients through the body. This medicine is used to treat people who cannot take iron by mouth and have low levels of iron in the blood. °This medicine may be used for other purposes; ask your health care provider or pharmacist if you have questions. °COMMON BRAND NAME(S): Dexferrum, INFeD °What should I tell my health care provider before I take this medicine? °They need to know if you have any of these conditions: °· anemia not caused by low iron levels °· heart disease °· high levels of iron in the blood °· kidney disease °· liver disease °· an unusual or allergic reaction to iron, other medicines, foods, dyes, or preservatives °· pregnant or trying to get pregnant °· breast-feeding °How should I use this medicine? °This medicine is for injection into a vein or a muscle. It is given by a health care professional in a hospital or clinic setting. °Talk to your pediatrician regarding the use of this medicine in children. While this drug may be prescribed for children as young as 4 months old for selected conditions, precautions do apply. °Overdosage: If you think you have taken too much of this medicine contact a poison control center or emergency room at once. °NOTE: This medicine is only for you. Do not share this medicine with others. °What if I miss a dose? °It is important not to miss your dose. Call your doctor or health care professional if you are unable to keep an appointment. °What may interact with this medicine? °Do not take this medicine with any of the following medications: °· deferoxamine °· dimercaprol °· other iron products °This medicine may also interact with the following medications: °· chloramphenicol °· deferasirox °This list may not describe all possible interactions. Give your health care provider a list of all the  medicines, herbs, non-prescription drugs, or dietary supplements you use. Also tell them if you smoke, drink alcohol, or use illegal drugs. Some items may interact with your medicine. °What should I watch for while using this medicine? °Visit your doctor or health care professional regularly. Tell your doctor if your symptoms do not start to get better or if they get worse. You may need blood work done while you are taking this medicine. °You may need to follow a special diet. Talk to your doctor. Foods that contain iron include: whole grains/cereals, dried fruits, beans, or peas, leafy green vegetables, and organ meats (liver, kidney). °Long-term use of this medicine may increase your risk of some cancers. Talk to your doctor about how to limit your risk. °What side effects may I notice from receiving this medicine? °Side effects that you should report to your doctor or health care professional as soon as possible: °· allergic reactions like skin rash, itching or hives, swelling of the face, lips, or tongue °· blue lips, nails, or skin °· breathing problems °· changes in blood pressure °· chest pain °· confusion °· fast, irregular heartbeat °· feeling faint or lightheaded, falls °· fever or chills °· flushing, sweating, or hot feelings °· joint or muscle aches or pains °· pain, tingling, numbness in the hands or feet °· seizures °· unusually weak or tired °Side effects that usually do not require medical attention (report to your doctor or health care professional if they continue or are bothersome): °· change in taste (metallic taste) °·   diarrhea °· headache °· irritation at site where injected °· nausea, vomiting °· stomach upset °This list may not describe all possible side effects. Call your doctor for medical advice about side effects. You may report side effects to FDA at 1-800-FDA-1088. °Where should I keep my medicine? °This drug is given in a hospital or clinic and will not be stored at home. °NOTE: This  sheet is a summary. It may not cover all possible information. If you have questions about this medicine, talk to your doctor, pharmacist, or health care provider. °© 2021 [employer] (2007-09-18 16:59:50) ° °

## 2020-07-13 ENCOUNTER — Ambulatory Visit
Admission: RE | Admit: 2020-07-13 | Discharge: 2020-07-13 | Disposition: A | Discharge status: Home / Self Care | Payer: Medicare Other | Source: Ambulatory Visit | Attending: Internal Medicine | Admitting: Internal Medicine

## 2020-07-13 ENCOUNTER — Other Ambulatory Visit: Payer: Self-pay

## 2020-07-13 DIAGNOSIS — J449 Chronic obstructive pulmonary disease, unspecified: Secondary | ICD-10-CM | POA: Diagnosis not present

## 2020-07-13 DIAGNOSIS — R911 Solitary pulmonary nodule: Secondary | ICD-10-CM

## 2020-07-13 DIAGNOSIS — E7800 Pure hypercholesterolemia, unspecified: Secondary | ICD-10-CM | POA: Diagnosis not present

## 2020-07-13 DIAGNOSIS — I639 Cerebral infarction, unspecified: Secondary | ICD-10-CM | POA: Diagnosis not present

## 2020-07-13 DIAGNOSIS — E039 Hypothyroidism, unspecified: Secondary | ICD-10-CM | POA: Diagnosis not present

## 2020-07-13 DIAGNOSIS — E1140 Type 2 diabetes mellitus with diabetic neuropathy, unspecified: Secondary | ICD-10-CM | POA: Diagnosis not present

## 2020-07-13 DIAGNOSIS — N1831 Chronic kidney disease, stage 3a: Secondary | ICD-10-CM | POA: Diagnosis not present

## 2020-07-13 DIAGNOSIS — G459 Transient cerebral ischemic attack, unspecified: Secondary | ICD-10-CM | POA: Diagnosis not present

## 2020-07-13 DIAGNOSIS — R918 Other nonspecific abnormal finding of lung field: Secondary | ICD-10-CM | POA: Diagnosis not present

## 2020-07-13 DIAGNOSIS — K219 Gastro-esophageal reflux disease without esophagitis: Secondary | ICD-10-CM | POA: Diagnosis not present

## 2020-07-13 DIAGNOSIS — I509 Heart failure, unspecified: Secondary | ICD-10-CM | POA: Diagnosis not present

## 2020-07-13 DIAGNOSIS — I10 Essential (primary) hypertension: Secondary | ICD-10-CM | POA: Diagnosis not present

## 2020-07-13 DIAGNOSIS — D509 Iron deficiency anemia, unspecified: Secondary | ICD-10-CM | POA: Diagnosis not present

## 2020-07-15 DIAGNOSIS — R911 Solitary pulmonary nodule: Secondary | ICD-10-CM | POA: Diagnosis not present

## 2020-07-15 DIAGNOSIS — Z87891 Personal history of nicotine dependence: Secondary | ICD-10-CM | POA: Diagnosis not present

## 2020-07-15 NOTE — Progress Notes (Signed)
[place]   [phone] [phone]   Clinic Follow up Note   Patient Care Team: Wenda Low, MD as PCP - General (Internal Medicine) O'Neal, Cassie Freer, MD as PCP - Cardiology (Cardiology) Truitt Merle, MD as Consulting Physician (Hematology) Angelia Mould, MD as Consulting Physician (Vascular Surgery) Koleen Distance, MD as Referring Physician (Gastroenterology)  Date of Service:  07/17/2020  CHIEF COMPLAINT: F/u of anemia    CURRENT THERAPY:  IVFeraheme as needed (if ferritin <100). Changed to IV Venofer on 01/02/19. Due to recent GI bleeding, we increased Venofer to every 2 weeks on 05/22/20.   INTERVAL HISTORY:  Bethany [name] is here for a follow up of anemia. She presents to the clinic alone. She notes she feels more tired than usual. She denies GI bleeding. She notes she is constipated more. She is using senaka once nightly for BM. She also has ducolax as needed. She notes for her it is common for her to have BM for 3-4 days then not have one for 2-3 days. Pt denies significant cough, chest pain, mild dyspnea on exertion is stable.    REVIEW OF SYSTEMS:   Constitutional: Denies fevers, chills or abnormal weight loss (+) Fatigue  Eyes: Denies blurriness of vision Ears, nose, mouth, throat, and face: Denies mucositis or sore throat Respiratory: Denies cough, dyspnea or wheezes Cardiovascular: Denies palpitation, chest discomfort or lower extremity swelling Gastrointestinal:  Denies nausea, heartburn (+) intermittent Constipation  Skin: Denies abnormal skin rashes Lymphatics: Denies new lymphadenopathy or easy bruising Neurological:Denies numbness, tingling or new weaknesses Behavioral/Psych: Mood is stable, no new changes  All other systems were reviewed with the patient and are negative.  MEDICAL HISTORY:  Past Medical History:  Diagnosis Date  . Acute renal failure ([place])   . Anemia   . Arthritis   . COPD (chronic  obstructive pulmonary disease) ([place])   . Diabetes mellitus without complication ([place])    type II   . GERD (gastroesophageal reflux disease)   . Hypercalcemia   . Hyperlipidemia   . Hypertension   . Pneumonia   . Renal disorder   . Single kidney   . Stroke [facility])    July 2021  . Thrombosis   . Tobacco abuse     SURGICAL HISTORY: Past Surgical History:  Procedure Laterality Date  . ABDOMINAL HYSTERECTOMY    . blood clots removed from descending aorta     . CHOLECYSTECTOMY    . COLONOSCOPY    . COLONOSCOPY WITH PROPOFOL N/A 04/17/2017   Procedure: COLONOSCOPY WITH PROPOFOL;  Surgeon: Wilford Corner, MD;  Location: [facility];  Service: Endoscopy;  Laterality: N/A;  . ESOPHAGOGASTRODUODENOSCOPY (EGD) WITH PROPOFOL N/A 04/17/2017   Procedure: ESOPHAGOGASTRODUODENOSCOPY (EGD) WITH PROPOFOL;  Surgeon: Wilford Corner, MD;  Location: [facility];  Service: Endoscopy;  Laterality: N/A;  . IR 3D INDEPENDENT WKST  05/04/2020  . IR ANGIO INTRA EXTRACRAN SEL COM CAROTID INNOMINATE BILAT MOD SED  02/06/2020  . IR ANGIO INTRA EXTRACRAN SEL INTERNAL CAROTID UNI L MOD SED  05/04/2020  . IR ANGIO VERTEBRAL SEL VERTEBRAL BILAT MOD SED  02/06/2020  . IR ANGIOGRAM FOLLOW UP STUDY  05/04/2020  . IR IMAGING GUIDED PORT INSERTION  05/01/2019  . IR NEURO EACH ADD'L AFTER BASIC UNI LEFT (MS)  05/04/2020  . IR RADIOLOGIST EVAL & MGMT  05/28/2020  . IR TRANSCATH/EMBOLIZ  05/04/2020  . IR US GUIDE VASC ACCESS RIGHT  02/06/2020  . IR US GUIDE VASC ACCESS RIGHT  05/04/2020  . NEPHRECTOMY RECIPIENT    . RADIOLOGY WITH ANESTHESIA N/A 05/04/2020   Procedure: Sheppard Plumber;  Surgeon: Luanne Bras, MD;  Location: [place];  Service: Radiology;  Laterality: N/A;    I have reviewed the social history and family history with the patient and they are unchanged from previous note.  ALLERGIES:  is allergic to contrast media [iodinated diagnostic agents].  MEDICATIONS:  Current Outpatient Medications   Medication Sig Dispense Refill  . allopurinol (ZYLOPRIM) 100 MG tablet Take 100 mg by mouth at bedtime.    Marland Kitchen amLODipine (NORVASC) 10 MG tablet Take 10 mg by mouth daily.    . APPLE CIDER VINEGAR PO Take 1 tablet by mouth daily.    Marland Kitchen aspirin EC 81 MG EC tablet Take 1 tablet (81 mg total) by mouth daily. Swallow whole. 30 tablet 11  . atorvastatin (LIPITOR) 80 MG tablet Take 1 tablet (80 mg total) by mouth daily. 30 tablet 11  . baclofen (LIORESAL) 10 MG tablet Take 10 mg by mouth 3 (three) times daily as needed for muscle spasms.    . buprenorphine (BUTRANS) 20 MCG/HR PTWK Place 1 patch onto the skin every Monday.     . clopidogrel (PLAVIX) 75 MG tablet Take 75 mg by mouth daily.    . Dulaglutide (TRULICITY) 1.5 ZO/1.0RU SOPN Inject 1.5 mg into the skin every Monday.     . ezetimibe (ZETIA) 10 MG tablet Take 10 mg by mouth daily.    . feeding supplement, ENSURE ENLIVE, (ENSURE ENLIVE) LIQD Take 237 mLs by mouth 2 (two) times daily between meals. 237 mL 12  . furosemide (LASIX) 40 MG tablet Take 1 tablet (40 mg total) by mouth daily as needed. (Patient taking differently: Take 40 mg by mouth daily as needed for fluid.) 45 tablet 3  . gabapentin (NEURONTIN) 100 MG capsule Take 2 capsules (200 mg total) by mouth 4 (four) times daily. 240 capsule 3  . insulin glargine, 1 Unit Dial, (TOUJEO SOLOSTAR) 300 UNIT/ML Solostar Pen Inject 20 Units into the skin at bedtime.     . Ipratropium-Albuterol (COMBIVENT) 20-100 MCG/ACT AERS respimat Inhale 1 puff into the lungs every 6 (six) hours as needed for wheezing or shortness of breath. 1 Inhaler 0  . levothyroxine (SYNTHROID) 75 MCG tablet Take 75 mcg by mouth daily.    Marland Kitchen lidocaine-prilocaine (EMLA) cream Apply 1 application topically as needed. (Patient taking differently: Apply 1 application topically daily as needed (port access).) 30 g 1  . losartan (COZAAR) 25 MG tablet Take 1 tablet (25 mg total) by mouth daily. 30 tablet 11  . metoprolol (LOPRESSOR) 50  MG tablet Take 50 mg by mouth 2 (two) times daily.     . Multiple Vitamin (MULTIVITAMIN WITH MINERALS) TABS tablet Take 1 tablet by mouth daily.    Marland Kitchen NARCAN 4 MG/0.1ML LIQD nasal spray kit Place 1 spray into the nose as needed for opioid reversal.    . oxyCODONE-acetaminophen (PERCOCET) 10-325 MG tablet Take 1 tablet by mouth every 6 (six) hours as needed (breakthrough pain).     . pantoprazole (PROTONIX) 40 MG tablet Take 40 mg by mouth every morning.    . pregabalin (LYRICA) 25 MG capsule Take 25 mg by mouth 2 (two) times daily.     . sennosides-docusate sodium (SENOKOT-S) 8.6-50 MG tablet Take 1 tablet by mouth at bedtime.    . Vitamin D, Ergocalciferol, (DRISDOL) 1.25 MG (50000 UNIT) CAPS capsule Take 50,000 Units by mouth every Monday.     Marland Kitchen  XTAMPZA ER 27 MG C12A Take 27 mg by mouth 2 (two) times daily.      No current facility-administered medications for this visit.   Facility-Administered Medications Ordered in Other Visits  Medication Dose Route Frequency Provider Last Rate Last Admin  . 0.9 %  sodium chloride infusion   Intravenous Continuous Truitt Merle, MD   Stopped at 07/17/20 1425    PHYSICAL EXAMINATION: ECOG PERFORMANCE STATUS: 2 - Symptomatic, <50% confined to bed  Vitals:   07/17/20 1040 07/17/20 1043  BP: (!) 161/50 (!) 154/53  Pulse: 67   Resp: 18   Temp: (!) 97.3 F (36.3 C)   SpO2: 100%    Filed Weights   07/17/20 1040  Weight: 176 lb (79.8 kg)    Due to COVID19 we will limit examination to appearance. Patient had no complaints.  GENERAL:alert, no distress and comfortable SKIN: skin color normal, no rashes or significant lesions EYES: normal, Conjunctiva are pink and non-injected, sclera clear  NEURO: alert & oriented x 3 with fluent speech    LABORATORY DATA:  I have reviewed the data as listed CBC Latest Ref Rng & Units 07/17/2020 07/03/2020 06/19/2020  WBC 4.0 - 10.5 K/uL 9.2 8.8 8.0  Hemoglobin 12.0 - 15.0 g/dL 10.8(L) 10.1(L) 9.8(L)  Hematocrit 36.0 -  46.0 % 36.4 34.5(L) 33.3(L)  Platelets 150 - 400 K/uL 233 210 320     CMP Latest Ref Rng & Units 05/29/2020 05/08/2020 05/05/2020  Glucose 70 - 99 mg/dL 247(H) 219(H) 106(H)  BUN 8 - 23 mg/dL 27(H) 15 22  Creatinine 0.44 - 1.00 mg/dL 1.43(H) 1.08(H) 1.12(H)  Sodium 135 - 145 mmol/L 138 139 140  Potassium 3.5 - 5.1 mmol/L 4.8 4.4 4.4  Chloride 98 - 111 mmol/L 109 108 109  CO2 22 - 32 mmol/L 24 24 21(L)  Calcium 8.9 - 10.3 mg/dL 9.5 9.9 8.8(L)  Total Protein 6.5 - 8.1 g/dL 5.9(L) - -  Total Bilirubin 0.3 - 1.2 mg/dL 0.2(L) - -  Alkaline Phos 38 - 126 U/L 135(H) - -  AST 15 - 41 U/L 18 - -  ALT 0 - 44 U/L 15 - -    CT Chest 07/13/20 IMPRESSION: 1. New bilateral pulmonary nodules including a dominant 1.3 cm pulmonary nodule in the left upper lobe. Findings are concerning for metastatic disease. 2. Stable small pulmonary nodule in the left lower lobe. 3. Slight interval growth of a ground-glass pulmonary nodule in the left lung apex. Continued follow-up is recommended. 4. Additional chronic findings as detailed above.  Aortic Atherosclerosis (ICD10-I70.0) and Emphysema (ICD10-J43.9).   RADIOGRAPHIC STUDIES: I have personally reviewed the radiological images as listed and agreed with the findings in the report. No results found.   ASSESSMENT & PLAN:  Bethany [name] is a 70 y.o. female with   1. 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 She has required blood transfusions in the past in 2013 and in 2018 and frequent hospital admission for anemia. -She has intermittent epistaxis, but no skin or mucosa telangiectasia, no significant family history of bleeding disorder, she does not meet the diagnosis criteria for hereditary hemorrhagic telangiectasia. -Her lastcolonoscopy/endoscopy was 04/2017. -She is not on oral iron, due to the lack  of benefit. -Currently being treated withIV Iron to prevent significant anemiawith goal of Ferritin 100-200 range. She was switched to IV Venofer on  8/19/20due to her insurance coverage.Increased to 400mgwith each infusionfrom 02/14/20.Due to recent GI bleeding, we increased Venofer to every 1-2 weeks on 05/22/20.  -Her recent severe anemia has improved with frequent IV Iron and holding Plavix. She is more fatigued again, but no sign of GI bleeding.  -Labs reviewed, anemia continues to improve, overall adequate to proceed with IV Venofer today, no blood transfusion.  -Continue400mgVenoferevery2weeks if needed. Will hold Venofer if previous ferritin>300   2. Smoking History, Lung Nodules concerning for malignancy  -She has been smoking intermittently for 40 years.Then increased smoking daily after her mother passed in 2021.  -Ihave repeatedly encouraged her to continue to try to quit completely, she is willing to try.  -Her CT Chest from 07/12/19 shows Stable 7 mm nodule in left lower lobe. Also shows New 4 mm nodule is noted in left upper lobe and 12 mm sub solid density is noted in left lung apex. Mostly stable on 10/29/19 CT Chest.  -Her CT Chest from 07/13/20 showed Stable small pulmonary nodule in the left lower lobe. Scan also shows New bilateral pulmonary nodules including a dominant 1.3 cm pulmonary nodule in the left upper lobe. Findings are concerning for primary lung cancer or metastatic disease. I personally reviewed images with patient today.  -I discussed this could be lung cancer or lung metastasis from another primary. I discussed there is a small possibility this is benign, but my suspicion this is cancer is high.  -I recommend further work up with PET scan . She plans to have mammogram on 08/13/20. Her recent colonoscopy was benign. If eligible, I recommend lung biopsy for definitive diagnosis.  -will present her case in thoracic tumor conference next week   3. Chronic GI  Bleeding, intermittent epistaxis, constipation -Has intestinal AVM history  -Lastcolonoscopy/EGD by Dr. Schooler on 04/17/17 showed gastritis, diverticulosis and internal hemorrhoids, all benign with no obvious signs of bleeding at that time. -She does not meet the diagnosis criteria for hereditary hemorrhagic telangiectasia (HHT) -[facility] -After 04/2020 hospitalization she was initially constipated then has had black stool since. With holding Plavix, She has less black stool. She is fine to continue baby aspirin.   4. H/o of arterial thrombosis,Ischemic stroke 11/2019, Left Brain Aneurysm (4mm and 2mm) -S/p unilateral nephrectomy due to damage fromarterialthrombosis -Managed by vascular physician, Dr. Dixonand neurologist Dr Sethi -Previous work-up for lupus anticoagulantand antiphospholipid syndromewerenegative, normal protein S and C level. -She notes she still has mild speech deficit (difficulty finding her words).  -On 02/06/20 Dr Deveshaw found a second brain aneurysm.She underwent embolization surgery on 05/04/20 -After 04/2020 procedure she has restarted baby aspirin and Plavix until her next neuro f/u. Given increased GI bleeding and worsened anemia, we have held Plavix since 05/22/20. She is fine to continue baby aspirin.   5. COPD, HTN, CKD stage III,DM with Peripheral Neuropathy,Chronic Back Pain  -She is currently on 600mg Gabapentin 4 times a day. -[facility].    PLAN: -PET in 1-2 weeks  -IR CT guided left lung nodule biopsy.  I spoke with IR Dr. Mir, he thinks is reasonable to do the biopsy, but he would like to see the PET scan first to see if there are any other easier site to biopsy.  -Mammogram on 08/13/20 -Proceed with Venofer400mg today, no blood transfusion  -Lab, flush, and IV Venofer  400mg every 2   weeks for 6 weeks, will hold Venofer if previous ferritin>300 -she will continue holding plavix, OK to continue aspirin  -f/u 2 weeks  -I discussed with her PCP Dr. Husain today    No problem-specific Assessment & Plan notes found for this encounter.   Orders Placed This Encounter  Procedures  . NM PET Image Initial (PI) Skull Base To Thigh    Standing Status:   Future    Standing Expiration Date:   07/17/2021    Order Specific Question:   If indicated for the ordered procedure, I authorize the administration of a radiopharmaceutical per Radiology protocol    Answer:   Yes    Order Specific Question:   Preferred imaging location?    Answer:   [facility]  . CT Biopsy    Standing Status:   Future    Standing Expiration Date:   07/17/2021    Scheduling Instructions:     I spoke with Dr. Mir and he approved the biopsy but wants the biopsy to be scheduled after PET scan    Order Specific Question:   Lab orders requested (DO NOT place separate lab orders, these will be automatically ordered during procedure specimen collection):    Answer:   Surgical Pathology    Order Specific Question:   Reason for Exam (SYMPTOM  OR DIAGNOSIS REQUIRED)    Answer:   1.3cm nodule in left lung, suspicious for malignancy, need tissue diagnosis    Order Specific Question:   Preferred location?    Answer:   [facility]   All questions were answered. The patient knows to call the clinic with any problems, questions or concerns. No barriers to learning was detected. The total time spent in the appointment was 40 minutes.     Yan Feng, MD 07/17/2020   I, Amoya Bennett, am acting as scribe for Yan Feng, MD.   I have reviewed the above documentation for accuracy and completeness, and I agree with the above.       

## 2020-07-17 ENCOUNTER — Encounter: Payer: Self-pay | Admitting: Hematology

## 2020-07-17 ENCOUNTER — Inpatient Hospital Stay: Payer: Medicare Other

## 2020-07-17 ENCOUNTER — Other Ambulatory Visit: Payer: Self-pay

## 2020-07-17 ENCOUNTER — Telehealth: Payer: Self-pay

## 2020-07-17 ENCOUNTER — Inpatient Hospital Stay: Payer: Medicare Other | Attending: Hematology

## 2020-07-17 ENCOUNTER — Inpatient Hospital Stay (HOSPITAL_BASED_OUTPATIENT_CLINIC_OR_DEPARTMENT_OTHER): Payer: Medicare Other | Admitting: Hematology

## 2020-07-17 VITALS — BP 154/53 | HR 67 | Temp 97.3°F | Resp 18 | Ht 65.0 in | Wt 176.0 lb

## 2020-07-17 VITALS — BP 137/49 | HR 62 | Resp 18

## 2020-07-17 DIAGNOSIS — N183 Chronic kidney disease, stage 3 unspecified: Secondary | ICD-10-CM | POA: Diagnosis not present

## 2020-07-17 DIAGNOSIS — D649 Anemia, unspecified: Secondary | ICD-10-CM

## 2020-07-17 DIAGNOSIS — G8929 Other chronic pain: Secondary | ICD-10-CM | POA: Insufficient documentation

## 2020-07-17 DIAGNOSIS — R911 Solitary pulmonary nodule: Secondary | ICD-10-CM

## 2020-07-17 DIAGNOSIS — K5520 Angiodysplasia of colon without hemorrhage: Secondary | ICD-10-CM

## 2020-07-17 DIAGNOSIS — K922 Gastrointestinal hemorrhage, unspecified: Secondary | ICD-10-CM | POA: Diagnosis not present

## 2020-07-17 DIAGNOSIS — D500 Iron deficiency anemia secondary to blood loss (chronic): Secondary | ICD-10-CM | POA: Diagnosis not present

## 2020-07-17 DIAGNOSIS — I129 Hypertensive chronic kidney disease with stage 1 through stage 4 chronic kidney disease, or unspecified chronic kidney disease: Secondary | ICD-10-CM | POA: Insufficient documentation

## 2020-07-17 DIAGNOSIS — M549 Dorsalgia, unspecified: Secondary | ICD-10-CM | POA: Diagnosis not present

## 2020-07-17 DIAGNOSIS — Z452 Encounter for adjustment and management of vascular access device: Secondary | ICD-10-CM | POA: Diagnosis not present

## 2020-07-17 DIAGNOSIS — E1142 Type 2 diabetes mellitus with diabetic polyneuropathy: Secondary | ICD-10-CM | POA: Diagnosis not present

## 2020-07-17 DIAGNOSIS — Q2733 Arteriovenous malformation of digestive system vessel: Secondary | ICD-10-CM | POA: Insufficient documentation

## 2020-07-17 DIAGNOSIS — E1122 Type 2 diabetes mellitus with diabetic chronic kidney disease: Secondary | ICD-10-CM | POA: Insufficient documentation

## 2020-07-17 DIAGNOSIS — J449 Chronic obstructive pulmonary disease, unspecified: Secondary | ICD-10-CM | POA: Insufficient documentation

## 2020-07-17 LAB — CBC WITH DIFFERENTIAL (CANCER CENTER ONLY)
Abs Immature Granulocytes: 0.03 10*3/uL (ref 0.00–0.07)
Basophils Absolute: 0 10*3/uL (ref 0.0–0.1)
Basophils Relative: 0 %
Eosinophils Absolute: 0.1 10*3/uL (ref 0.0–0.5)
Eosinophils Relative: 1 %
HCT: 36.4 % (ref 36.0–46.0)
Hemoglobin: 10.8 g/dL — ABNORMAL LOW (ref 12.0–15.0)
Immature Granulocytes: 0 %
Lymphocytes Relative: 9 %
Lymphs Abs: 0.8 10*3/uL (ref 0.7–4.0)
MCH: 24.3 pg — ABNORMAL LOW (ref 26.0–34.0)
MCHC: 29.7 g/dL — ABNORMAL LOW (ref 30.0–36.0)
MCV: 82 fL (ref 80.0–100.0)
Monocytes Absolute: 0.4 10*3/uL (ref 0.1–1.0)
Monocytes Relative: 4 %
Neutro Abs: 7.9 10*3/uL — ABNORMAL HIGH (ref 1.7–7.7)
Neutrophils Relative %: 86 %
Platelet Count: 233 10*3/uL (ref 150–400)
RBC: 4.44 MIL/uL (ref 3.87–5.11)
RDW: 16.3 % — ABNORMAL HIGH (ref 11.5–15.5)
WBC Count: 9.2 10*3/uL (ref 4.0–10.5)
nRBC: 0 % (ref 0.0–0.2)

## 2020-07-17 LAB — IRON AND TIBC
Iron: 80 ug/dL (ref 41–142)
Saturation Ratios: 25 % (ref 21–57)
TIBC: 315 ug/dL (ref 236–444)
UIBC: 236 ug/dL (ref 120–384)

## 2020-07-17 LAB — FERRITIN: Ferritin: 372 ng/mL — ABNORMAL HIGH (ref 11–307)

## 2020-07-17 LAB — SAMPLE TO BLOOD BANK

## 2020-07-17 MED ORDER — ACETAMINOPHEN 325 MG PO TABS
ORAL_TABLET | ORAL | Status: AC
  Filled 2020-07-17: qty 2

## 2020-07-17 MED ORDER — SODIUM CHLORIDE 0.9% FLUSH
10.0000 mL | Freq: Once | INTRAVENOUS | Status: AC
  Administered 2020-07-17: 10 mL
  Filled 2020-07-17: qty 10

## 2020-07-17 MED ORDER — SODIUM CHLORIDE 0.9 % IV SOLN
INTRAVENOUS | Status: DC
  Filled 2020-07-17: qty 250

## 2020-07-17 MED ORDER — IRON SUCROSE 20 MG/ML IV SOLN
400.0000 mg | Freq: Once | INTRAVENOUS | Status: AC
  Administered 2020-07-17: 400 mg via INTRAVENOUS
  Filled 2020-07-17: qty 20

## 2020-07-17 MED ORDER — SODIUM CHLORIDE 0.9% FLUSH
10.0000 mL | Freq: Once | INTRAVENOUS | Status: DC
  Filled 2020-07-17: qty 10

## 2020-07-17 MED ORDER — ACETAMINOPHEN 325 MG PO TABS
650.0000 mg | ORAL_TABLET | Freq: Once | ORAL | Status: AC
  Administered 2020-07-17: 650 mg via ORAL

## 2020-07-17 MED ORDER — HEPARIN SOD (PORK) LOCK FLUSH 100 UNIT/ML IV SOLN
500.0000 [IU] | Freq: Once | INTRAVENOUS | Status: AC
  Administered 2020-07-17: 500 [IU]
  Filled 2020-07-17: qty 5

## 2020-07-17 NOTE — Telephone Encounter (Signed)
I left vm with Dr Sherilyn Cooter assistance requesting he call Dr Burr Medico on her cellphone, number provided regarding Ms Nilson ct scan

## 2020-07-17 NOTE — Progress Notes (Signed)
Patient declined 30 minute post-observation. Vital signs stable upon discharge.

## 2020-07-17 NOTE — Patient Instructions (Addendum)
Central Line, Adult A central line is a long, thin tube (catheter) that can be used to collect blood for testing or to give medicine through a vein. The tip of the central line ends in a large vein just above the heart (vena cava). A central line may be placed because:  You need to get medicines or fluids through an IV for a long period of time.  You need nutrition but cannot eat or absorb nutrients.  The veins in your hands or arms are difficult to use for IV access.  You need a blood transfusion.  You need chemotherapy or dialysis. Types of central lines There are four main types of central lines:  Peripherally inserted central catheter (PICC) line. This type is used for access of one week or longer. It can be used to draw blood and give fluids or medicines. A PICC looks like an IV tube, but it goes up the arm to the heart. It is usually inserted in the upper arm and taped in place on the arm.  Tunneled central line. This type is used for long-term therapy and dialysis. It is placed in a large vein in the neck, chest, or groin. It is inserted through a small incision made over the vein, and then it is advanced to the heart. It is tunneled under the skin and brought out through a second incision.  Non-tunneled central line. This type is used for short-term access, usually for a maximum of 7 days. It is often used in the emergency department. It is inserted in the neck, chest, or groin.  Implanted port. This type is used for long-term therapy. It can stay in place longer than other types of central lines. It is normally inserted in the upper chest, but it can also be placed in the upper arm or the abdomen. It is inserted and removed with surgery, and it is accessed using a needle. The type of central line that you receive depends on how long you will need it, your medical condition, and the condition of your veins.   Tell a health care provider about:  Any allergies you have.  All  medicines you are taking, including vitamins, herbs, eye drops, creams, and over-the-counter medicines.  Any problems you or family members have had with anesthetic medicines.  Any blood disorders you have.  Any surgeries you have had.  Any medical conditions you have.  Whether you are pregnant or may be pregnant. What are the risks? Generally, placement and use of a central line is safe. However, problems may occur, including:  Infection.  A blood clot that blocks the central line or forms in the vein and travels to the heart.  Bleeding from the place where the central line was inserted.  Developing a hole or crack within the central line. If this happens, the central line will need to be replaced.  Central line failure.  The catheter moving or coming out of place. What happens before the procedure? Medicines Ask your health care provider about:  Changing or stopping your regular medicines. This is especially important if you are taking diabetes medicines or blood thinners.  Taking medicines such as aspirin and ibuprofen. These medicines can thin your blood. Do not take these medicines unless your health care provider tells you to take them.  Taking over-the-counter medicines, vitamins, herbs, and supplements. General instructions  Follow instructions from your health care provider about eating or drinking restrictions.  Ask your health care provider: ? How your   procedure site will be marked. ? What steps will be taken to help prevent infection. These steps may include:  Removing hair at the procedure site.  Washing skin with a germ-killing soap.  Plan to have a responsible adult take you home from the hospital or clinic.  If you will be going home right after the procedure, plan to have a responsible adult care for you for the time you are told. This is important. What happens during the procedure? The procedure will vary depending on the type of central line being  placed. In general:  An IV will be inserted into one of your veins.  You will be given one or more of the following: ? A medicine to help you relax (sedative). ? A medicine to numb the area (local anesthetic).  Your skin will be cleaned with a germ-killing (antiseptic) solution, and you may be covered with sterile drapes.  Your blood pressure, heart rate, breathing rate, and blood oxygen level will be monitored during the procedure.  The central line catheter will be inserted into the vein and advanced to the correct spot. The health care provider may use X-ray equipment to help guide the catheter to the right place.  A bandage (dressing) will be placed over the insertion area. The procedure may vary among health care providers and hospitals. What can I expect after the procedure?  Your blood pressure, heart rate, breathing rate, and blood oxygen level will be monitored until you leave the hospital or clinic.  Antiseptic caps may be placed on the ends of the central line tubing.  If you were given a sedative during the procedure, it can affect you for several hours. Do not drive or operate machinery until your health care provider says that it is safe. Follow these instructions at home: Flushing and cleaning the central line  Follow instructions from your health care provider about flushing and cleaning the central line and the area around it.  Only use sterile supplies to flush the central line. Use supplies from your health care provider, a pharmacy, or another source that is recommended by your health care provider.  Before you flush the central line or clean the central line or the area around it: ? Wash your hands with soap and water for at least 20 seconds. If soap and water are not available, use alcohol-based hand sanitizer. ? Clean the central line hub with rubbing alcohol. Unless directed otherwise by the manufacturer's instructions, scrub using a twisting motion and rub for  10 to 15 seconds or for 30 twists. Be sure you scrub the top of the hub, not just the sides. Never reuse alcohol pads. Let the hub dry before use. Prevent it from touching anything while drying.   Caring for the incision or central line site  Check your incision or central line site every day for signs of infection. Check for: ? Redness, swelling, or pain. ? Fluid or blood. ? Warmth. ? Pus or a bad smell.  Keep the insertion site of your central line clean and dry at all times.  Change your dressing only as told by your health care provider.  Keep your dressing dry. If it gets wet, have it changed as soon as possible. General instructions  Follow instructions from your health care provider for the type of device that you have.  Keep the tube clamped, unless it is being used.  If the central line accidentally gets pulled on, make sure: ? The dressing is okay. ?   There is no bleeding. ? The line has not been pulled out.  Do not use scissors or sharp objects near the tube.  Do not take baths, swim, or use a hot tub until your health care provider approves. Ask your health care provider if you may take showers. You may only be allowed to take sponge baths.  Ask your health care provider what activities are safe for you. You may be restricted from lifting or making repetitive arm movements on the side of your central line.  Take over-the-counter and prescription medicines only as told by your health care provider.  Keep all follow-up visits. This is important. Storage and disposal of supplies  Keep your supplies in a clean, dry location.  Throw away any used syringes in a disposal container that is meant for sharp items (sharps container). You can buy a sharps container from a pharmacy, or you can make one by using an empty hard plastic bottle with a cover.  Place any used dressings or infusion bags into a plastic bag. Throw that bag in the trash. Contact a health care provider  if:  You have redness, swelling, or pain around your insertion site.  You have fluid or blood coming from your insertion site.  Your insertion site feels warm to the touch.  You have pus or a bad smell coming from your insertion site. Get help right away if:  You have: ? A fever or chills. ? Shortness of breath. ? Chest pain or a racing heartbeat. ? Swelling in your neck, face, chest, or arm on the side of your central line.  You feel dizzy or you faint.  Your incision or central line site has red streaks spreading away from the area.  Your incision or central line site is bleeding and does not stop.  Your central line is difficult to flush or will not flush.  You do not get a blood return from the central line.  Your central line gets loose or damaged or comes out.  Your catheter leaks when flushed or when fluids are infused into it. Summary  A central line is a long, thin tube (catheter) that can be used to give medicine through a vein.  Follow specific instructions from your health care provider for the type of device that you have.  Keep the insertion site of your central line clean and dry at all times.  Keep the tube clamped, unless it is being used. This information is not intended to replace advice given to you by your health care provider. Make sure you discuss any questions you have with your health care provider. Document Revised: 01/02/2020 Document Reviewed: 01/02/2020 [employer] Patient Education  2021 [employer]  

## 2020-07-17 NOTE — Patient Instructions (Signed)

## 2020-07-20 ENCOUNTER — Telehealth: Payer: Self-pay | Admitting: Hematology

## 2020-07-20 NOTE — Telephone Encounter (Signed)
Called pt per 3/4 LOS - left message for patient with appt date and time

## 2020-07-21 DIAGNOSIS — Z79899 Other long term (current) drug therapy: Secondary | ICD-10-CM | POA: Diagnosis not present

## 2020-07-21 DIAGNOSIS — M5137 Other intervertebral disc degeneration, lumbosacral region: Secondary | ICD-10-CM | POA: Diagnosis not present

## 2020-07-21 DIAGNOSIS — G894 Chronic pain syndrome: Secondary | ICD-10-CM | POA: Diagnosis not present

## 2020-07-21 DIAGNOSIS — F17210 Nicotine dependence, cigarettes, uncomplicated: Secondary | ICD-10-CM | POA: Diagnosis not present

## 2020-07-21 DIAGNOSIS — Z9181 History of falling: Secondary | ICD-10-CM | POA: Diagnosis not present

## 2020-07-21 DIAGNOSIS — M79605 Pain in left leg: Secondary | ICD-10-CM | POA: Diagnosis not present

## 2020-07-21 DIAGNOSIS — Z87891 Personal history of nicotine dependence: Secondary | ICD-10-CM | POA: Diagnosis not present

## 2020-07-21 DIAGNOSIS — M79604 Pain in right leg: Secondary | ICD-10-CM | POA: Diagnosis not present

## 2020-07-23 ENCOUNTER — Other Ambulatory Visit: Payer: Self-pay | Admitting: *Deleted

## 2020-07-23 DIAGNOSIS — E7800 Pure hypercholesterolemia, unspecified: Secondary | ICD-10-CM | POA: Diagnosis not present

## 2020-07-23 NOTE — Progress Notes (Signed)
The proposed treatment discussed in cancer conference is for discussion purpose only and is not a binding recommendation. The patient was not physically examined nor present for their treatment options. Therefore, final treatment plans cannot be decided.  ?

## 2020-07-28 ENCOUNTER — Other Ambulatory Visit: Payer: Self-pay

## 2020-07-28 ENCOUNTER — Ambulatory Visit (HOSPITAL_COMMUNITY)
Admission: RE | Admit: 2020-07-28 | Discharge: 2020-07-28 | Disposition: A | Discharge status: Home / Self Care | Payer: Medicare Other | Source: Ambulatory Visit | Attending: Hematology | Admitting: Hematology

## 2020-07-28 DIAGNOSIS — R911 Solitary pulmonary nodule: Secondary | ICD-10-CM

## 2020-07-28 DIAGNOSIS — N6310 Unspecified lump in the right breast, unspecified quadrant: Secondary | ICD-10-CM | POA: Insufficient documentation

## 2020-07-28 DIAGNOSIS — R918 Other nonspecific abnormal finding of lung field: Secondary | ICD-10-CM | POA: Insufficient documentation

## 2020-07-28 LAB — GLUCOSE, CAPILLARY: Glucose-Capillary: 172 mg/dL — ABNORMAL HIGH (ref 70–99)

## 2020-07-28 MED ORDER — FLUDEOXYGLUCOSE F - 18 (FDG) INJECTION
8.0000 | Freq: Once | INTRAVENOUS | Status: AC
  Administered 2020-07-28: 8.82 via INTRAVENOUS

## 2020-07-29 ENCOUNTER — Encounter (HOSPITAL_COMMUNITY): Payer: Self-pay

## 2020-07-29 NOTE — Progress Notes (Signed)
GB     1       [place] Dumm Female, 70 y.o., 12/26/1950  MRN:  [id number] Phone:  [phone] Jerilynn Mages)       PCP:  Wenda Low, MD Coverage:  [facility] ([facility] 3) 08/13/2020 at 10:50 AM           RE: Biopsy Received: Today  Message Details  Sandi Mariscal, MD  Arne Cleveland, MD Cc: Lennox Solders E PET scan was recommended at Tx conference on 3.10 which has now been completed on 3/15   Spoke with Burr Medico.   No Bx.   Recommended work up of right breast mass as this likely represents the primary and lung nodules look like mets and are VERY challenging to percutaneously sample given size, location and pt body habitus.   Cathren Harsh    Previous Messages  ----- Message -----  From: Arne Cleveland, MD  Sent: 07/29/2020 11:15 AM EDT  To: Sandi Mariscal, MD, Lenore Cordia  Subject: FW: Biopsy                    Looks like this was discussed thoracic cancer conf 3/10?    ----- Message -----  From: Lenore Cordia  Sent: 07/28/2020  3:29 PM EDT  To: [facility] Procedure Requests  Subject: Biopsy                      Procedure Requested: CT Biopsy    Reason for Procedure: 1.3cm nodule in left lung, suspicious for malignancy, need tissue diagnosis    Provider Requesting: Dr Burr Medico  Provider Telephone: [phone]    Other Info: Pet Final

## 2020-07-29 NOTE — Progress Notes (Signed)
Bethany [name]   [phone]) [phone] [phone]) [phone]   Clinic Follow up Note   Patient Care Team: Wenda Low, MD as PCP - General (Internal Medicine) O'Neal, Cassie Freer, MD as PCP - Cardiology (Cardiology) Truitt Merle, MD as Consulting Physician (Hematology) Angelia Mould, MD as Consulting Physician (Vascular Surgery) Koleen Distance, MD as Referring Physician (Gastroenterology)  Date of Service:  07/31/2020  CHIEF COMPLAINT: F/u of anemia   CURRENT THERAPY:  IVFeraheme as needed (if ferritin <100). Changed to IV Venofer on 01/02/19. Due to recent GI bleeding, we increased Venofer to every 2 weeks on 05/22/20.  INTERVAL HISTORY:  Bethany [name] is here for a follow up of anemia. She presents to the clinic with her sister. She was to have biopsy but Dr Pascal Lux did not proceed given she was found to have high suspicion of right breast cancer. She notes her last mammogram was 2 years ago. She notes she had stable left breast mass on prior mammograms, otherwise not abnormal She did have 1 right breast biopsy in [place]. She noted for the past 6 months having gas of her upper left back pain. Her sister notes having nodule in liver in [place] and Dr Hilarie Fredrickson is watching it.     REVIEW OF SYSTEMS:   Constitutional: Denies fevers, chills or abnormal weight loss Eyes: Denies blurriness of vision Ears, nose, mouth, throat, and face: Denies mucositis or sore throat Respiratory: Denies cough, dyspnea or wheezes Cardiovascular: Denies palpitation, chest discomfort or lower extremity swelling Gastrointestinal:  Denies nausea, heartburn or change in bowel habits Skin: Denies abnormal skin rashes Lymphatics: Denies new lymphadenopathy or easy bruising Neurological:Denies numbness, tingling or new weaknesses Behavioral/Psych: Mood is stable, no new changes  All other systems were reviewed with the patient and are negative.  MEDICAL HISTORY:  Past Medical History:   Diagnosis Date  . Acute renal failure ([place])   . Anemia   . Arthritis   . COPD (chronic obstructive pulmonary disease) ([place])   . Diabetes mellitus without complication ([place])    type II   . GERD (gastroesophageal reflux disease)   . Hypercalcemia   . Hyperlipidemia   . Hypertension   . Pneumonia   . Renal disorder   . Single kidney   . Stroke [facility])    July 2021  . Thrombosis   . Tobacco abuse     SURGICAL HISTORY: Past Surgical History:  Procedure Laterality Date  . ABDOMINAL HYSTERECTOMY    . blood clots removed from descending aorta     . CHOLECYSTECTOMY    . COLONOSCOPY    . COLONOSCOPY WITH PROPOFOL N/A 04/17/2017   Procedure: COLONOSCOPY WITH PROPOFOL;  Surgeon: Wilford Corner, MD;  Location: [facility];  Service: Endoscopy;  Laterality: N/A;  . ESOPHAGOGASTRODUODENOSCOPY (EGD) WITH PROPOFOL N/A 04/17/2017   Procedure: ESOPHAGOGASTRODUODENOSCOPY (EGD) WITH PROPOFOL;  Surgeon: Wilford Corner, MD;  Location: [facility];  Service: Endoscopy;  Laterality: N/A;  . IR 3D INDEPENDENT WKST  05/04/2020  . IR ANGIO INTRA EXTRACRAN SEL COM CAROTID INNOMINATE BILAT MOD SED  02/06/2020  . IR ANGIO INTRA EXTRACRAN SEL INTERNAL CAROTID UNI L MOD SED  05/04/2020  . IR ANGIO VERTEBRAL SEL VERTEBRAL BILAT MOD SED  02/06/2020  . IR ANGIOGRAM FOLLOW UP STUDY  05/04/2020  . IR IMAGING GUIDED PORT INSERTION  05/01/2019  . IR NEURO EACH ADD'L AFTER BASIC UNI LEFT (MS)  05/04/2020  . IR RADIOLOGIST EVAL & MGMT  05/28/2020  . IR TRANSCATH/EMBOLIZ  05/04/2020  .  IR US GUIDE VASC ACCESS RIGHT  02/06/2020  . IR US GUIDE VASC ACCESS RIGHT  05/04/2020  . NEPHRECTOMY RECIPIENT    . RADIOLOGY WITH ANESTHESIA N/A 05/04/2020   Procedure: Sheppard Plumber;  Surgeon: Luanne Bras, MD;  Location: [place];  Service: Radiology;  Laterality: N/A;    I have reviewed the social history and family history with the patient and they are unchanged from previous note.  ALLERGIES:  is allergic to contrast  media [iodinated diagnostic agents].  MEDICATIONS:  Current Outpatient Medications  Medication Sig Dispense Refill  . allopurinol (ZYLOPRIM) 100 MG tablet Take 100 mg by mouth at bedtime.    Marland Kitchen amLODipine (NORVASC) 10 MG tablet Take 10 mg by mouth daily.    . APPLE CIDER VINEGAR PO Take 1 tablet by mouth daily.    Marland Kitchen aspirin EC 81 MG EC tablet Take 1 tablet (81 mg total) by mouth daily. Swallow whole. 30 tablet 11  . atorvastatin (LIPITOR) 80 MG tablet Take 1 tablet (80 mg total) by mouth daily. 30 tablet 11  . baclofen (LIORESAL) 10 MG tablet Take 10 mg by mouth 3 (three) times daily as needed for muscle spasms.    . buprenorphine (BUTRANS) 20 MCG/HR PTWK Place 1 patch onto the skin every Monday.     . clopidogrel (PLAVIX) 75 MG tablet Take 75 mg by mouth daily.    . Dulaglutide (TRULICITY) 1.5 XL/2.4MW SOPN Inject 1.5 mg into the skin every Monday.     . ezetimibe (ZETIA) 10 MG tablet Take 10 mg by mouth daily.    . feeding supplement, ENSURE ENLIVE, (ENSURE ENLIVE) LIQD Take 237 mLs by mouth 2 (two) times daily between meals. 237 mL 12  . furosemide (LASIX) 40 MG tablet Take 1 tablet (40 mg total) by mouth daily as needed. (Patient taking differently: Take 40 mg by mouth daily as needed for fluid.) 45 tablet 3  . gabapentin (NEURONTIN) 100 MG capsule Take 2 capsules (200 mg total) by mouth 4 (four) times daily. 240 capsule 3  . insulin glargine, 1 Unit Dial, (TOUJEO SOLOSTAR) 300 UNIT/ML Solostar Pen Inject 20 Units into the skin at bedtime.     . Ipratropium-Albuterol (COMBIVENT) 20-100 MCG/ACT AERS respimat Inhale 1 puff into the lungs every 6 (six) hours as needed for wheezing or shortness of breath. 1 Inhaler 0  . levothyroxine (SYNTHROID) 75 MCG tablet Take 75 mcg by mouth daily.    Marland Kitchen lidocaine-prilocaine (EMLA) cream Apply 1 application topically as needed. (Patient taking differently: Apply 1 application topically daily as needed (port access).) 30 g 1  . losartan (COZAAR) 25 MG tablet  Take 1 tablet (25 mg total) by mouth daily. 30 tablet 11  . metoprolol (LOPRESSOR) 50 MG tablet Take 50 mg by mouth 2 (two) times daily.     . Multiple Vitamin (MULTIVITAMIN WITH MINERALS) TABS tablet Take 1 tablet by mouth daily.    Marland Kitchen NARCAN 4 MG/0.1ML LIQD nasal spray kit Place 1 spray into the nose as needed for opioid reversal.    . oxyCODONE-acetaminophen (PERCOCET) 10-325 MG tablet Take 1 tablet by mouth every 6 (six) hours as needed (breakthrough pain).     . pantoprazole (PROTONIX) 40 MG tablet Take 40 mg by mouth every morning.    . pregabalin (LYRICA) 25 MG capsule Take 25 mg by mouth 2 (two) times daily.     . sennosides-docusate sodium (SENOKOT-S) 8.6-50 MG tablet Take 1 tablet by mouth at bedtime.    . Vitamin D,  Ergocalciferol, (DRISDOL) 1.25 MG (50000 UNIT) CAPS capsule Take 50,000 Units by mouth every Monday.     Marland Kitchen XTAMPZA ER 27 MG C12A Take 27 mg by mouth 2 (two) times daily.      No current facility-administered medications for this visit.    PHYSICAL EXAMINATION: ECOG PERFORMANCE STATUS: 1 - Symptomatic but completely ambulatory  Vitals:   07/31/20 1207  BP: (!) 143/57  Pulse: 97  Resp: 18  Temp: 97.8 F (36.6 C)  SpO2: 98%   Filed Weights   07/31/20 1207  Weight: 171 lb 12.8 oz (77.9 kg)    GENERAL:alert, no distress and comfortable SKIN: skin color, texture, turgor are normal, no rashes or significant lesions EYES: normal, Conjunctiva are pink and non-injected, sclera clear  NECK: supple, thyroid normal size, non-tender, without nodularity LYMPH:  no palpable lymphadenopathy in the cervical, axillary  LUNGS: clear to auscultation and percussion with normal breathing effort HEART: regular rate & rhythm and no murmurs and no lower extremity edema ABDOMEN:abdomen soft, non-tender and normal bowel sounds Musculoskeletal:no cyanosis of digits and no clubbing  NEURO: alert & oriented x 3 with fluent speech, no focal motor/sensory deficits BREAST: (+) Palpable  2.5x2.5cm lump in 9-10:00 position of right breast, 3cmfn   LABORATORY DATA:  I have reviewed the data as listed CBC Latest Ref Rng & Units 07/31/2020 07/17/2020 07/03/2020  WBC 4.0 - 10.5 K/uL 7.7 9.2 8.8  Hemoglobin 12.0 - 15.0 g/dL 10.6(L) 10.8(L) 10.1(L)  Hematocrit 36.0 - 46.0 % 35.3(L) 36.4 34.5(L)  Platelets 150 - 400 K/uL 227 233 210     CMP Latest Ref Rng & Units 05/29/2020 05/08/2020 05/05/2020  Glucose 70 - 99 mg/dL 247(H) 219(H) 106(H)  BUN 8 - 23 mg/dL 27(H) 15 22  Creatinine 0.44 - 1.00 mg/dL 1.43(H) 1.08(H) 1.12(H)  Sodium 135 - 145 mmol/L 138 139 140  Potassium 3.5 - 5.1 mmol/L 4.8 4.4 4.4  Chloride 98 - 111 mmol/L 109 108 109  CO2 22 - 32 mmol/L 24 24 21(L)  Calcium 8.9 - 10.3 mg/dL 9.5 9.9 8.8(L)  Total Protein 6.5 - 8.1 g/dL 5.9(L) - -  Total Bilirubin 0.3 - 1.2 mg/dL 0.2(L) - -  Alkaline Phos 38 - 126 U/L 135(H) - -  AST 15 - 41 U/L 18 - -  ALT 0 - 44 U/L 15 - -   CT Chest 07/13/20 IMPRESSION: 1. New bilateral pulmonary nodules including a dominant 1.3 cm pulmonary nodule in the left upper lobe. Findings are concerning for metastatic disease. 2. Stable small pulmonary nodule in the left lower lobe. 3. Slight interval growth of a ground-glass pulmonary nodule in the left lung apex. Continued follow-up is recommended. 4. Additional chronic findings as detailed above. Aortic Atherosclerosis (ICD10-I70.0) and Emphysema (ICD10-J43.9).   PET 07/28/20  IMPRESSION: Scattered pulmonary nodules, the dominant nodule in the LEFT chest showing mild hypermetabolic features. Findings may reflect metastatic disease. RIGHT breast mass with low level FDG uptake is nonspecific, also raising the question of neoplasm. Consider mammographic correlation for further evaluation.   RADIOGRAPHIC STUDIES: I have personally reviewed the radiological images as listed and agreed with the findings in the report. No results found.   ASSESSMENT & PLAN:  DAI APEL is a 70 y.o.  female with    1. Right breast mass, Left Lung nodules concerning for malignancy -For Lung cancer screening, her CT Chest from 07/12/19 shows Stable 7 mm nodule in left lower lobe. Also shows New 4 mm nodule is noted in  left upper lobe and 12 mm sub solid density is noted in left lung apex.  -Her CT Chest from 07/13/20 showed Stable small pulmonary nodule in the left lower lobe. Scan also shows New bilateral pulmonary nodules including a dominant 1.3 cm pulmonary nodule in the left upper lobe. Findings are concerning for primary lung cancer or metastatic disease.  -I personally reviewed and discussed her PET from 07/28/20 which shows Scattered pulmonary nodules, the dominant nodule in left lung showing mild hypermetabolic features. Findings may reflect metastatic disease. RIGHT breast mass with low level FDG uptake is nonspecific, also raising the question of neoplasm. I personally reviewed images with patient today.  -Pt notes h/o benign right breast fibroadenoma, biopsied in 2014, stable on 05/2017 mammogram. She also notes having liver lesion in [place] and Dr Hilarie Fredrickson is monitoring. Appeared benign on 01/2019 US abdomen. These were not seen on PET.  -Biopsy of left lung was attempted by Dr Melvyn Novas but held given difficult location, biopsy of breast was recommended first if indicated. She is agreeable.  -I recommend she proceed with diagnostic mammogram and [place] for further evaluation, to see if she needs biopsy of right breast mass. She is agreeable.  -On exam today she has Palpable 2.5x2.5cm lump in 9-10:00 position of right breast, 3cmfn  -f/u in 2 weeks    2. 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 She has required blood transfusions in the past in 2013 and in 2018 and frequent hospital admission for anemia. -She has intermittent epistaxis, but no skin or  mucosa telangiectasia, no significant family history of bleeding disorder, she does not meet the diagnosis criteria for hereditary hemorrhagic telangiectasia. -Her lastcolonoscopy/endoscopy was 04/2017. -She is not on oral iron, due to the lack of benefit. -Currently being treated withIV Iron to prevent significant anemiawith goal of Ferritin 100-200 range. She was switched to IV Venofer on 8/19/20due to her insurance coverage.Increased to 453mwith each infusionfrom 02/14/20.Due to recent GI bleeding, we increased Venofer to every1-2 weeks on 05/22/20.  -Her recent severe anemia has improved with frequentIV Iron and holding Plavix. She is more fatigued again, but no sign of GI bleeding.  -Labs reviewed today, Hg 10.6, iron panel still pending. No IV Ferahema today  -[id number] if needed. Will hold Venofer if previous ferritin>300 -[facility]    3. Smoking History -She has been smoking intermittently for 40 years.Then increased smoking daily after her mother passed in 2021.  -IJaneal Holmesepeatedly encouraged her to continue to try to quit completely, she is willing to try.  -She has had lung nodules on Lung cancer screening CT. Most recent 07/13/20 CT chest does show concern for malignancy in lung.   4. Chronic GI Bleeding, intermittent epistaxis, constipation -Has intestinal AVM history  -Hercolonoscopy/EGD by Dr. ScMichail Sermonn 04/17/17 showed gastritis, diverticulosis and internal hemorrhoids, all benign with no obvious signs of bleeding at that time.She had repeat exams with Dr ShRodena Pietynd of 2019. I will obtain report.  -She does not meet the diagnosis criteria for hereditary hemorrhagic telangiectasia (HHT) -After 04/2020 hospitalization she was initially constipated then has had black stool since.With holding Plavix, She has less black stool. She is fine to continue baby  aspirin.  5. H/o of arterial thrombosis,Ischemic stroke 11/2019, Left Brain Aneurysm (78m63mnd 2mm678mS/p  unilateral nephrectomy due to damage fromarterialthrombosis -Managed by vascular physician, Dr. Joyce Gross neurologist Dr Leonie Man -Previous work-up for lupus anticoagulantand antiphospholipid syndromewerenegative, normal protein S and C level. -She notes she still has mild speech deficit (difficulty finding her words).  -On 02/06/20 Dr Allegra Grana found a second brain aneurysm.She underwent embolization surgery on 05/04/20 -After 04/2020 procedure she has restarted baby aspirin and Plavix until her next neuro f/u.Given increased GI bleeding and worsened anemia, we have held Plavix since 05/22/20. She is fine to continue baby aspirin.  6. COPD, HTN, CKD stage III,DM with Peripheral Neuropathy,Chronic Back Pain  -She is currently on 667m Gabapentin 4 times a day. -[facility]    PLAN: -diagnostic mammogram and right breast and axilla [place] 1[place]2 weeks.  -Continue to hold Plavix.  -Labs reviewed, no IV Iron today  -Lab and f/u in 2 weeks  -[place]  No problem-specific Assessment & Plan notes found for this encounter.   Orders Placed This Encounter  Procedures  . MM DIAG BREAST TOMO BILATERAL    Standing Status:   Future    Standing Expiration Date:   07/31/2021    Order Specific Question:   Reason for Exam (SYMPTOM  OR DIAGNOSIS REQUIRED)    Answer:   right breast mass on recent PET, rule out malignancy    Order Specific Question:   [facility]    [facility]    Standing Status:   Future    Standing Expiration Date:   07/31/2021    Order Specific Question:   Reason for Exam (SYMPTOM  OR DIAGNOSIS REQUIRED)    Answer:   right breast mass on recent PET, rule out malignancy     Order Specific Question:   [facility]    [facility]  All questions were answered. The patient knows to call the clinic with any problems, questions or concerns. No barriers to learning was detected. The total time spent in the appointment was 30 minutes.     YTruitt Merle MD 07/31/2020   I, AJoslyn Devon am acting as scribe for YTruitt Merle MD.   I have reviewed the above documentation for accuracy and completeness, and I agree with the above.

## 2020-07-31 ENCOUNTER — Inpatient Hospital Stay (HOSPITAL_BASED_OUTPATIENT_CLINIC_OR_DEPARTMENT_OTHER): Payer: Medicare Other | Admitting: Hematology

## 2020-07-31 ENCOUNTER — Encounter: Payer: Self-pay | Admitting: Hematology

## 2020-07-31 ENCOUNTER — Inpatient Hospital Stay: Payer: Medicare Other

## 2020-07-31 ENCOUNTER — Other Ambulatory Visit (HOSPITAL_COMMUNITY): Payer: Self-pay | Admitting: Interventional Radiology

## 2020-07-31 ENCOUNTER — Other Ambulatory Visit: Payer: Self-pay

## 2020-07-31 VITALS — BP 143/57 | HR 97 | Temp 97.8°F | Resp 18 | Ht 65.0 in | Wt 171.8 lb

## 2020-07-31 DIAGNOSIS — N649 Disorder of breast, unspecified: Secondary | ICD-10-CM | POA: Diagnosis not present

## 2020-07-31 DIAGNOSIS — D500 Iron deficiency anemia secondary to blood loss (chronic): Secondary | ICD-10-CM

## 2020-07-31 DIAGNOSIS — K922 Gastrointestinal hemorrhage, unspecified: Secondary | ICD-10-CM | POA: Diagnosis not present

## 2020-07-31 DIAGNOSIS — E1122 Type 2 diabetes mellitus with diabetic chronic kidney disease: Secondary | ICD-10-CM | POA: Diagnosis not present

## 2020-07-31 DIAGNOSIS — Z452 Encounter for adjustment and management of vascular access device: Secondary | ICD-10-CM | POA: Diagnosis not present

## 2020-07-31 DIAGNOSIS — Q2733 Arteriovenous malformation of digestive system vessel: Secondary | ICD-10-CM | POA: Diagnosis not present

## 2020-07-31 DIAGNOSIS — R918 Other nonspecific abnormal finding of lung field: Secondary | ICD-10-CM

## 2020-07-31 DIAGNOSIS — G8929 Other chronic pain: Secondary | ICD-10-CM | POA: Diagnosis not present

## 2020-07-31 DIAGNOSIS — K5520 Angiodysplasia of colon without hemorrhage: Secondary | ICD-10-CM

## 2020-07-31 DIAGNOSIS — J449 Chronic obstructive pulmonary disease, unspecified: Secondary | ICD-10-CM | POA: Diagnosis not present

## 2020-07-31 DIAGNOSIS — I129 Hypertensive chronic kidney disease with stage 1 through stage 4 chronic kidney disease, or unspecified chronic kidney disease: Secondary | ICD-10-CM | POA: Diagnosis not present

## 2020-07-31 DIAGNOSIS — M549 Dorsalgia, unspecified: Secondary | ICD-10-CM | POA: Diagnosis not present

## 2020-07-31 DIAGNOSIS — I671 Cerebral aneurysm, nonruptured: Secondary | ICD-10-CM

## 2020-07-31 DIAGNOSIS — N183 Chronic kidney disease, stage 3 unspecified: Secondary | ICD-10-CM | POA: Diagnosis not present

## 2020-07-31 DIAGNOSIS — E1142 Type 2 diabetes mellitus with diabetic polyneuropathy: Secondary | ICD-10-CM | POA: Diagnosis not present

## 2020-07-31 DIAGNOSIS — D649 Anemia, unspecified: Secondary | ICD-10-CM

## 2020-07-31 LAB — CBC WITH DIFFERENTIAL (CANCER CENTER ONLY)
Abs Immature Granulocytes: 0.08 10*3/uL — ABNORMAL HIGH (ref 0.00–0.07)
Basophils Absolute: 0 10*3/uL (ref 0.0–0.1)
Basophils Relative: 1 %
Eosinophils Absolute: 0.1 10*3/uL (ref 0.0–0.5)
Eosinophils Relative: 2 %
HCT: 35.3 % — ABNORMAL LOW (ref 36.0–46.0)
Hemoglobin: 10.6 g/dL — ABNORMAL LOW (ref 12.0–15.0)
Immature Granulocytes: 1 %
Lymphocytes Relative: 9 %
Lymphs Abs: 0.7 10*3/uL (ref 0.7–4.0)
MCH: 24.3 pg — ABNORMAL LOW (ref 26.0–34.0)
MCHC: 30 g/dL (ref 30.0–36.0)
MCV: 81 fL (ref 80.0–100.0)
Monocytes Absolute: 0.5 10*3/uL (ref 0.1–1.0)
Monocytes Relative: 7 %
Neutro Abs: 6.2 10*3/uL (ref 1.7–7.7)
Neutrophils Relative %: 80 %
Platelet Count: 227 10*3/uL (ref 150–400)
RBC: 4.36 MIL/uL (ref 3.87–5.11)
RDW: 16 % — ABNORMAL HIGH (ref 11.5–15.5)
WBC Count: 7.7 10*3/uL (ref 4.0–10.5)
nRBC: 0 % (ref 0.0–0.2)

## 2020-07-31 LAB — IRON AND TIBC
Iron: 78 ug/dL (ref 41–142)
Saturation Ratios: 25 % (ref 21–57)
TIBC: 319 ug/dL (ref 236–444)
UIBC: 240 ug/dL (ref 120–384)

## 2020-07-31 LAB — FERRITIN: Ferritin: 604 ng/mL — ABNORMAL HIGH (ref 11–307)

## 2020-07-31 MED ORDER — HEPARIN SOD (PORK) LOCK FLUSH 100 UNIT/ML IV SOLN
500.0000 [IU] | Freq: Once | INTRAVENOUS | Status: AC
  Administered 2020-07-31: 500 [IU]
  Filled 2020-07-31: qty 5

## 2020-07-31 MED ORDER — SODIUM CHLORIDE 0.9% FLUSH
10.0000 mL | Freq: Once | INTRAVENOUS | Status: AC
  Administered 2020-07-31: 10 mL
  Filled 2020-07-31: qty 10

## 2020-08-03 ENCOUNTER — Telehealth: Payer: Self-pay | Admitting: Hematology

## 2020-08-03 NOTE — Telephone Encounter (Signed)
Scheduled per 3/18 los. Called pt and left a msg

## 2020-08-06 ENCOUNTER — Other Ambulatory Visit: Payer: Self-pay | Admitting: *Deleted

## 2020-08-06 DIAGNOSIS — I10 Essential (primary) hypertension: Secondary | ICD-10-CM | POA: Diagnosis not present

## 2020-08-06 DIAGNOSIS — D501 Sideropenic dysphagia: Secondary | ICD-10-CM | POA: Diagnosis not present

## 2020-08-06 DIAGNOSIS — K219 Gastro-esophageal reflux disease without esophagitis: Secondary | ICD-10-CM | POA: Diagnosis not present

## 2020-08-06 DIAGNOSIS — N6310 Unspecified lump in the right breast, unspecified quadrant: Secondary | ICD-10-CM

## 2020-08-06 DIAGNOSIS — J441 Chronic obstructive pulmonary disease with (acute) exacerbation: Secondary | ICD-10-CM | POA: Diagnosis not present

## 2020-08-06 DIAGNOSIS — E1140 Type 2 diabetes mellitus with diabetic neuropathy, unspecified: Secondary | ICD-10-CM | POA: Diagnosis not present

## 2020-08-06 DIAGNOSIS — G459 Transient cerebral ischemic attack, unspecified: Secondary | ICD-10-CM | POA: Diagnosis not present

## 2020-08-06 DIAGNOSIS — E039 Hypothyroidism, unspecified: Secondary | ICD-10-CM | POA: Diagnosis not present

## 2020-08-06 DIAGNOSIS — J449 Chronic obstructive pulmonary disease, unspecified: Secondary | ICD-10-CM | POA: Diagnosis not present

## 2020-08-06 DIAGNOSIS — E7800 Pure hypercholesterolemia, unspecified: Secondary | ICD-10-CM | POA: Diagnosis not present

## 2020-08-06 DIAGNOSIS — G8929 Other chronic pain: Secondary | ICD-10-CM | POA: Diagnosis not present

## 2020-08-06 DIAGNOSIS — I509 Heart failure, unspecified: Secondary | ICD-10-CM | POA: Diagnosis not present

## 2020-08-06 DIAGNOSIS — N1831 Chronic kidney disease, stage 3a: Secondary | ICD-10-CM | POA: Diagnosis not present

## 2020-08-07 ENCOUNTER — Ambulatory Visit
Admission: RE | Admit: 2020-08-07 | Discharge: 2020-08-07 | Disposition: A | Discharge status: Home / Self Care | Payer: Medicare Other | Source: Ambulatory Visit | Attending: Hematology | Admitting: Hematology

## 2020-08-07 ENCOUNTER — Other Ambulatory Visit: Payer: Self-pay

## 2020-08-07 DIAGNOSIS — N649 Disorder of breast, unspecified: Secondary | ICD-10-CM

## 2020-08-07 DIAGNOSIS — N6313 Unspecified lump in the right breast, lower outer quadrant: Secondary | ICD-10-CM | POA: Diagnosis not present

## 2020-08-07 DIAGNOSIS — R922 Inconclusive mammogram: Secondary | ICD-10-CM | POA: Diagnosis not present

## 2020-08-07 DIAGNOSIS — N6311 Unspecified lump in the right breast, upper outer quadrant: Secondary | ICD-10-CM | POA: Diagnosis not present

## 2020-08-07 DIAGNOSIS — N6002 Solitary cyst of left breast: Secondary | ICD-10-CM | POA: Diagnosis not present

## 2020-08-07 DIAGNOSIS — R928 Other abnormal and inconclusive findings on diagnostic imaging of breast: Secondary | ICD-10-CM | POA: Diagnosis not present

## 2020-08-12 NOTE — Progress Notes (Signed)
[place]   [phone] [phone]   Clinic Follow up Note   Patient Care Team: Wenda Low, MD as PCP - General (Internal Medicine) O'Neal, Cassie Freer, MD as PCP - Cardiology (Cardiology) Truitt Merle, MD as Consulting Physician (Hematology) Angelia Mould, MD as Consulting Physician (Vascular Surgery) Koleen Distance, MD as Referring Physician (Gastroenterology)  Date of Service:  08/14/2020  CHIEF COMPLAINT:  F/u of anemiaand abnormal image findings    CURRENT THERAPY:  IVFeraheme as needed (if ferritin <100). Changed to IV Venofer on 01/02/19. Due to recent GI bleeding, we increased Venofer to every 2 weeks on 05/22/20.  INTERVAL HISTORY:  Bethany [name] is here for a follow up. She presents to the clinic with her sister. She plans to proceed with Breast MRI on 08/24/20 and breast biopsy on 08/25/20. She notes she plans to fly and travel in 2 days. She notes she has BM daily and adequately. She denies bloody or black stool. She notes she is losing weight a few pounds at a time every visit.  She notes in 2021 she had lesions removed on her right thigh and was told this was cancer. She notes this was done at [facility].     REVIEW OF SYSTEMS:   Constitutional: Denies fevers, chills (+) weight loss Eyes: Denies blurriness of vision Ears, nose, mouth, throat, and face: Denies mucositis or sore throat Respiratory: Denies cough, dyspnea or wheezes Cardiovascular: Denies palpitation, chest discomfort or lower extremity swelling Gastrointestinal:  Denies nausea, heartburn or change in bowel habits Skin: Denies abnormal skin rashes MSK: (+) Left flank soreness Lymphatics: Denies new lymphadenopathy or easy bruising Neurological:Denies numbness, tingling or new weaknesses Behavioral/Psych: Mood is stable, no new changes  All other systems were reviewed with the patient and are negative.  MEDICAL HISTORY:  Past Medical  History:  Diagnosis Date  . Acute renal failure ([place])   . Anemia   . Arthritis   . COPD (chronic obstructive pulmonary disease) ([place])   . Diabetes mellitus without complication ([place])    type II   . GERD (gastroesophageal reflux disease)   . Hypercalcemia   . Hyperlipidemia   . Hypertension   . Pneumonia   . Renal disorder   . Single kidney   . Stroke [facility])    July 2021  . Thrombosis   . Tobacco abuse     SURGICAL HISTORY: Past Surgical History:  Procedure Laterality Date  . ABDOMINAL HYSTERECTOMY    . blood clots removed from descending aorta     . CHOLECYSTECTOMY    . COLONOSCOPY    . COLONOSCOPY WITH PROPOFOL N/A 04/17/2017   Procedure: COLONOSCOPY WITH PROPOFOL;  Surgeon: Wilford Corner, MD;  Location: [facility];  Service: Endoscopy;  Laterality: N/A;  . ESOPHAGOGASTRODUODENOSCOPY (EGD) WITH PROPOFOL N/A 04/17/2017   Procedure: ESOPHAGOGASTRODUODENOSCOPY (EGD) WITH PROPOFOL;  Surgeon: Wilford Corner, MD;  Location: [facility];  Service: Endoscopy;  Laterality: N/A;  . IR 3D INDEPENDENT WKST  05/04/2020  . IR ANGIO INTRA EXTRACRAN SEL COM CAROTID INNOMINATE BILAT MOD SED  02/06/2020  . IR ANGIO INTRA EXTRACRAN SEL INTERNAL CAROTID UNI L MOD SED  05/04/2020  . IR ANGIO VERTEBRAL SEL VERTEBRAL BILAT MOD SED  02/06/2020  . IR ANGIOGRAM FOLLOW UP STUDY  05/04/2020  . IR IMAGING GUIDED PORT INSERTION  05/01/2019  . IR NEURO EACH ADD'L AFTER BASIC UNI LEFT (MS)  05/04/2020  . IR RADIOLOGIST EVAL & MGMT  05/28/2020  . IR  TRANSCATH/EMBOLIZ  05/04/2020  . IR US GUIDE VASC ACCESS RIGHT  02/06/2020  . IR US GUIDE VASC ACCESS RIGHT  05/04/2020  . NEPHRECTOMY RECIPIENT    . RADIOLOGY WITH ANESTHESIA N/A 05/04/2020   Procedure: Sheppard Plumber;  Surgeon: Luanne Bras, MD;  Location: [place];  Service: Radiology;  Laterality: N/A;    I have reviewed the social history and family history with the patient and they are unchanged from previous note.  ALLERGIES:  is allergic to  contrast media [iodinated diagnostic agents].  MEDICATIONS:  Current Outpatient Medications  Medication Sig Dispense Refill  . allopurinol (ZYLOPRIM) 100 MG tablet Take 100 mg by mouth at bedtime.    Marland Kitchen amLODipine (NORVASC) 10 MG tablet Take 10 mg by mouth daily.    . APPLE CIDER VINEGAR PO Take 1 tablet by mouth daily.    Marland Kitchen aspirin EC 81 MG EC tablet Take 1 tablet (81 mg total) by mouth daily. Swallow whole. 30 tablet 11  . atorvastatin (LIPITOR) 80 MG tablet Take 1 tablet (80 mg total) by mouth daily. 30 tablet 11  . baclofen (LIORESAL) 10 MG tablet Take 10 mg by mouth 3 (three) times daily as needed for muscle spasms.    . buprenorphine (BUTRANS) 20 MCG/HR PTWK Place 1 patch onto the skin every Monday.     . clopidogrel (PLAVIX) 75 MG tablet Take 75 mg by mouth daily.    . Dulaglutide (TRULICITY) 1.5 FI/4.3PI SOPN Inject 1.5 mg into the skin every Monday.     . ezetimibe (ZETIA) 10 MG tablet Take 10 mg by mouth daily.    . feeding supplement, ENSURE ENLIVE, (ENSURE ENLIVE) LIQD Take 237 mLs by mouth 2 (two) times daily between meals. 237 mL 12  . furosemide (LASIX) 40 MG tablet Take 1 tablet (40 mg total) by mouth daily as needed. (Patient taking differently: Take 40 mg by mouth daily as needed for fluid.) 45 tablet 3  . gabapentin (NEURONTIN) 100 MG capsule Take 2 capsules (200 mg total) by mouth 4 (four) times daily. 240 capsule 3  . insulin glargine, 1 Unit Dial, (TOUJEO SOLOSTAR) 300 UNIT/ML Solostar Pen Inject 20 Units into the skin at bedtime.     . Ipratropium-Albuterol (COMBIVENT) 20-100 MCG/ACT AERS respimat Inhale 1 puff into the lungs every 6 (six) hours as needed for wheezing or shortness of breath. 1 Inhaler 0  . levothyroxine (SYNTHROID) 75 MCG tablet Take 75 mcg by mouth daily.    Marland Kitchen lidocaine-prilocaine (EMLA) cream Apply 1 application topically as needed. (Patient taking differently: Apply 1 application topically daily as needed (port access).) 30 g 1  . losartan (COZAAR) 25  MG tablet Take 1 tablet (25 mg total) by mouth daily. 30 tablet 11  . metoprolol (LOPRESSOR) 50 MG tablet Take 50 mg by mouth 2 (two) times daily.     . Multiple Vitamin (MULTIVITAMIN WITH MINERALS) TABS tablet Take 1 tablet by mouth daily.    Marland Kitchen NARCAN 4 MG/0.1ML LIQD nasal spray kit Place 1 spray into the nose as needed for opioid reversal.    . oxyCODONE-acetaminophen (PERCOCET) 10-325 MG tablet Take 1 tablet by mouth every 6 (six) hours as needed (breakthrough pain).     . pantoprazole (PROTONIX) 40 MG tablet Take 40 mg by mouth every morning.    . pregabalin (LYRICA) 25 MG capsule Take 25 mg by mouth 2 (two) times daily.     . sennosides-docusate sodium (SENOKOT-S) 8.6-50 MG tablet Take 1 tablet by mouth at bedtime.    Marland Kitchen  Vitamin D, Ergocalciferol, (DRISDOL) 1.25 MG (50000 UNIT) CAPS capsule Take 50,000 Units by mouth every Monday.     Marland Kitchen XTAMPZA ER 27 MG C12A Take 27 mg by mouth 2 (two) times daily.      No current facility-administered medications for this visit.    PHYSICAL EXAMINATION: ECOG PERFORMANCE STATUS: 1 - Symptomatic but completely ambulatory  Vitals:   08/14/20 0954  BP: (!) 141/56  Pulse: (!) 58  Resp: 19  Temp: (!) 96.3 F (35.7 C)  SpO2: 100%   Filed Weights   08/14/20 0954  Weight: 174 lb 3.2 oz (79 kg)    GENERAL:alert, no distress and comfortable SKIN: skin color, texture, turgor are normal, no rashes or significant lesions EYES: normal, Conjunctiva are pink and non-injected, sclera clear  NECK: supple, thyroid normal size, non-tender, without nodularity LYMPH:  no palpable lymphadenopathy in the cervical, axillary  LUNGS: clear to auscultation and percussion with normal breathing effort HEART: regular rate & rhythm and no murmurs and no lower extremity edema ABDOMEN:abdomen soft, non-tender and normal bowel sounds Musculoskeletal:no cyanosis of digits and no clubbing  NEURO: alert & oriented x 3 with fluent speech, no focal motor/sensory  deficits BREAST: (+) Palpable 2.5x3cm (previously 2.5x2.5cm) lump in 9-10:00 position of right breast, 3cmfn   LABORATORY DATA:  I have reviewed the data as listed CBC Latest Ref Rng & Units 08/14/2020 07/31/2020 07/17/2020  WBC 4.0 - 10.5 K/uL 6.3 7.7 9.2  Hemoglobin 12.0 - 15.0 g/dL 10.1(L) 10.6(L) 10.8(L)  Hematocrit 36.0 - 46.0 % 33.4(L) 35.3(L) 36.4  Platelets 150 - 400 K/uL 177 227 233     CMP Latest Ref Rng & Units 08/14/2020 05/29/2020 05/08/2020  Glucose 70 - 99 mg/dL 185(H) 247(H) 219(H)  BUN 8 - 23 mg/dL 32(H) 27(H) 15  Creatinine 0.44 - 1.00 mg/dL 1.55(H) 1.43(H) 1.08(H)  Sodium 135 - 145 mmol/L 139 138 139  Potassium 3.5 - 5.1 mmol/L 4.1 4.8 4.4  Chloride 98 - 111 mmol/L 106 109 108  CO2 22 - 32 mmol/L '23 24 24  ' Calcium 8.9 - 10.3 mg/dL 9.6 9.5 9.9  Total Protein 6.5 - 8.1 g/dL 6.5 5.9(L) -  Total Bilirubin 0.3 - 1.2 mg/dL 0.3 0.2(L) -  Alkaline Phos 38 - 126 U/L 249(H) 135(H) -  AST 15 - 41 U/L 211(HH) 18 -  ALT 0 - 44 U/L 166(H) 15 -   CT Chest 07/13/20 IMPRESSION: 1. New bilateral pulmonary nodules including a dominant 1.3 cm pulmonary nodule in the left upper lobe. Findings are concerning for metastatic disease. 2. Stable small pulmonary nodule in the left lower lobe. 3. Slight interval growth of a ground-glass pulmonary nodule in the left lung apex. Continued follow-up is recommended. 4. Additional chronic findings as detailed above. Aortic Atherosclerosis (ICD10-I70.0) and Emphysema (ICD10-J43.9).   PET 07/28/20  IMPRESSION: Scattered pulmonary nodules, the dominant nodule in the LEFT chest showing mild hypermetabolic features. Findings may reflect metastatic disease. RIGHT breast mass with low level FDG uptake is nonspecific, also raising the question of neoplasm. Consider mammographic correlation for further evaluation.   Mammogram 08/07/20  IMPRESSION: 1. Suspicious 7x 5 x 6 mm mass involving the outer RIGHT breast at the 9 o'clock position  approximately 5 cm from the nipple. However, the mass is significantly smaller than the focal asymmetry and architectural distortion identified on mammography, and is significantly smaller than the palpable thickening in this location. 2. No pathologic RIGHT axillary lymphadenopathy. 3. Benign mildly complicated cyst in the inner subareolar RIGHT  breast which accounts for a new mammographic finding. 4. No mammographic evidence of malignancy involving the LEFT breast.    RADIOGRAPHIC STUDIES: I have personally reviewed the radiological images as listed and agreed with the findings in the report. No results found.   ASSESSMENT & PLAN:  Bethany [name] is a 70 y.o. female with    1. Right breast mass, Left Lung nodules concerning for malignancy -For Lung cancer screening, her CT Chest from 07/12/19 shows Stable 7 mm nodule in left lower lobe. Also shows New 4 mm nodule is noted in left upper lobe and 12 mm sub solid density is noted in left lung apex. -Her CT Chest from 07/13/20 showedStable small pulmonary nodule in the left lower lobe. Scan also showsNew bilateral pulmonary nodules including a dominant 1.3 cm pulmonary nodule in the left upper lobe. Findings are concerning forprimary lung cancer ormetastatic disease. -Her PET from 07/28/20 shows Scattered pulmonary nodules, the dominant nodule in left lung showing mild hypermetabolic features. Findings may reflect metastatic disease. RIGHT breast mass with low level FDG uptake is nonspecific, also raising the question of neoplasm. -Pt notes h/o benign right breast fibroadenoma, biopsied in 2014, stable on 05/2017 mammogram. She also notes having liver lesion in [place] and Dr Hilarie Fredrickson is monitoring. Appeared benign on 01/2019 US abdomen. These were not seen on PET.  -Biopsy of left lung was attempted by Dr Melvyn Novas but held given difficult location.  -Her 08/07/20 Mammogram Suspicious 7 mm mass involving the outer RIGHT breast at the 9 o'clock  position approximately 5 cm from the nipple. Her known benign right breast cyst is present. She will proceed with right breast biopsy on 08/25/20.  -I discussed with this small breast mass, it may not be t related to her lung nodules. Will repeat scan of lung in about 2 months. Patient is open to another lung biopsy attempt.  -She notes still having gas like pressure of left flank. This is stable. She also notes her weight is trending down without trying. On exam, this is not tender. I suspect her left flank soreness is muscular chest wall related.    2. 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 She has required blood transfusions in the past in 2013 and in 2018 and frequent hospital admission for anemia. -She has intermittent epistaxis, but no skin or mucosa telangiectasia, no significant family history of bleeding disorder, she does not meet the diagnosis criteria for hereditary hemorrhagic telangiectasia. -Her lastcolonoscopy/endoscopy was 04/2017. -She is not on oral iron, due to the lack of benefit. -Currently being treated withIV Iron to prevent significant anemiawith goal of Ferritin 100-200 range. She was switched to IV Venofer on 8/19/20due to her insurance coverage.Increased to 449mwith each infusionfrom 02/14/20.Due to recent GI bleeding, we increased Venofer to every1-2 weeks on 05/22/20.  -Her recent severe anemia has improved with frequentIV Iron and holding Plavix.She is more fatigued again, but no sign of GI bleeding.  -Labs reviewed today and stable. Hg 10.1, iron panel still pending. No IV Venofer or blood transfusion today  -Continue4062menoferevery2weeksif needed. Will hold Venofer if previous ferritin>300 -[facility]   3.SmokingHistory -She has  been smokingintermittentlyfor 40 years.Then increased smoking daily after her mother passed in 2021. -IJaneal Holmesepeatedly encouraged her to continue to try to quit completely, she is  willing to try.  -She has had lung nodules on Lung cancer screening CT. Most recent 07/13/20 CT chest does show concern for malignancy in lung.   4. Chronic GI Bleeding, intermittent epistaxis, constipation -Has intestinal AVM history  -Hercolonoscopy/EGD by Dr. Michail Sermon on 04/17/17 showed gastritis, diverticulosis and internal hemorrhoids, all benign with no obvious signs of bleeding at that time.She had repeat exams with Dr Rodena Piety end of 2019. I will obtain report.  -She does not meet the diagnosis criteria for hereditary hemorrhagic telangiectasia (HHT) -After 04/2020 hospitalization she was initially constipated then has had black stool since.With holding Plavix, She has less black stool. She is fine to continue baby aspirin.  5. H/o of arterial thrombosis,Ischemic stroke 11/2019, Left Brain Aneurysm (63m and 28m -S/p unilateral nephrectomy due to damage fromarterialthrombosis -Managed by vascular physician, Dr. DiJoyce Grosseurologist Dr SeLeonie ManPrevious work-up for lupus anticoagulantand antiphospholipid syndromewerenegative, normal protein S and C level. -She notes she still has mild speech deficit (difficulty finding her words).  -On 02/06/20 Dr DeAllegra Granaound a second brain aneurysm.She underwent embolization surgery on 05/04/20 -After 04/2020 procedure she has restarted baby aspirin and Plavix until her next neuro f/u.Given increased GI bleeding and worsened anemia, we have held Plavix since 05/22/20. She is fine to continue baby aspirin.  6. COPD, HTN, CKD stage III,DM with Peripheral Neuropathy,Chronic Back Pain  -She is currently on 60058mabapentin 4 times a day. -[facility].    PLAN: -We reviewed  her breast US [place] mammogram findings  -Right breast biopsy on 08/25/20. -Continue to hold Plavix.  -Labs reviewed, no IV Venofer today -Lab and f/u in 2 weeks, continue Venofer q2weeks if needed  -She notes in 2021 she had lesions removed on her right thigh and was told this was cancer. [facility] I recommend she obtain copy of path.    No problem-specific Assessment & Plan notes found for this encounter.   No orders of the defined types were placed in this encounter.  All questions were answered. The patient knows to call the clinic with any problems, questions or concerns. No barriers to learning was detected. The total time spent in the appointment was 30 minutes.     YanTruitt MerleD 08/14/2020   I, AmoJoslyn Devonm acting as scribe for YanTruitt MerleD.   I have reviewed the above documentation for accuracy and completeness, and I agree with the above.

## 2020-08-13 ENCOUNTER — Ambulatory Visit: Payer: Medicare Other

## 2020-08-14 ENCOUNTER — Other Ambulatory Visit: Payer: Self-pay

## 2020-08-14 ENCOUNTER — Inpatient Hospital Stay: Payer: Medicare Other

## 2020-08-14 ENCOUNTER — Encounter: Payer: Self-pay | Admitting: Hematology

## 2020-08-14 ENCOUNTER — Inpatient Hospital Stay: Payer: Medicare Other | Attending: Hematology | Admitting: Hematology

## 2020-08-14 ENCOUNTER — Telehealth: Payer: Self-pay | Admitting: Hematology

## 2020-08-14 VITALS — BP 141/56 | HR 58 | Temp 96.3°F | Resp 19 | Ht 65.0 in | Wt 174.2 lb

## 2020-08-14 DIAGNOSIS — Q2733 Arteriovenous malformation of digestive system vessel: Secondary | ICD-10-CM | POA: Diagnosis not present

## 2020-08-14 DIAGNOSIS — N183 Chronic kidney disease, stage 3 unspecified: Secondary | ICD-10-CM | POA: Diagnosis not present

## 2020-08-14 DIAGNOSIS — D649 Anemia, unspecified: Secondary | ICD-10-CM

## 2020-08-14 DIAGNOSIS — K5520 Angiodysplasia of colon without hemorrhage: Secondary | ICD-10-CM | POA: Diagnosis not present

## 2020-08-14 DIAGNOSIS — N6310 Unspecified lump in the right breast, unspecified quadrant: Secondary | ICD-10-CM | POA: Diagnosis not present

## 2020-08-14 DIAGNOSIS — D500 Iron deficiency anemia secondary to blood loss (chronic): Secondary | ICD-10-CM

## 2020-08-14 DIAGNOSIS — K922 Gastrointestinal hemorrhage, unspecified: Secondary | ICD-10-CM | POA: Diagnosis not present

## 2020-08-14 DIAGNOSIS — I129 Hypertensive chronic kidney disease with stage 1 through stage 4 chronic kidney disease, or unspecified chronic kidney disease: Secondary | ICD-10-CM | POA: Insufficient documentation

## 2020-08-14 DIAGNOSIS — R918 Other nonspecific abnormal finding of lung field: Secondary | ICD-10-CM | POA: Diagnosis not present

## 2020-08-14 LAB — CBC WITH DIFFERENTIAL (CANCER CENTER ONLY)
Abs Immature Granulocytes: 0.03 10*3/uL (ref 0.00–0.07)
Basophils Absolute: 0 10*3/uL (ref 0.0–0.1)
Basophils Relative: 1 %
Eosinophils Absolute: 0.1 10*3/uL (ref 0.0–0.5)
Eosinophils Relative: 2 %
HCT: 33.4 % — ABNORMAL LOW (ref 36.0–46.0)
Hemoglobin: 10.1 g/dL — ABNORMAL LOW (ref 12.0–15.0)
Immature Granulocytes: 1 %
Lymphocytes Relative: 18 %
Lymphs Abs: 1.1 10*3/uL (ref 0.7–4.0)
MCH: 24.2 pg — ABNORMAL LOW (ref 26.0–34.0)
MCHC: 30.2 g/dL (ref 30.0–36.0)
MCV: 80.1 fL (ref 80.0–100.0)
Monocytes Absolute: 0.7 10*3/uL (ref 0.1–1.0)
Monocytes Relative: 11 %
Neutro Abs: 4.3 10*3/uL (ref 1.7–7.7)
Neutrophils Relative %: 67 %
Platelet Count: 177 10*3/uL (ref 150–400)
RBC: 4.17 MIL/uL (ref 3.87–5.11)
RDW: 15.8 % — ABNORMAL HIGH (ref 11.5–15.5)
WBC Count: 6.3 10*3/uL (ref 4.0–10.5)
nRBC: 0 % (ref 0.0–0.2)

## 2020-08-14 LAB — CMP (CANCER CENTER ONLY)
ALT: 166 U/L — ABNORMAL HIGH (ref 0–44)
AST: 211 U/L (ref 15–41)
Albumin: 3.8 g/dL (ref 3.5–5.0)
Alkaline Phosphatase: 249 U/L — ABNORMAL HIGH (ref 38–126)
Anion gap: 10 (ref 5–15)
BUN: 32 mg/dL — ABNORMAL HIGH (ref 8–23)
CO2: 23 mmol/L (ref 22–32)
Calcium: 9.6 mg/dL (ref 8.9–10.3)
Chloride: 106 mmol/L (ref 98–111)
Creatinine: 1.55 mg/dL — ABNORMAL HIGH (ref 0.44–1.00)
GFR, Estimated: 36 mL/min — ABNORMAL LOW (ref >60)
Glucose, Bld: 185 mg/dL — ABNORMAL HIGH (ref 70–99)
Potassium: 4.1 mmol/L (ref 3.5–5.1)
Sodium: 139 mmol/L (ref 135–145)
Total Bilirubin: 0.3 mg/dL (ref 0.3–1.2)
Total Protein: 6.5 g/dL (ref 6.5–8.1)

## 2020-08-14 LAB — IRON AND TIBC
Iron: 53 ug/dL (ref 41–142)
Saturation Ratios: 17 % — ABNORMAL LOW (ref 21–57)
TIBC: 319 ug/dL (ref 236–444)
UIBC: 266 ug/dL (ref 120–384)

## 2020-08-14 LAB — SAMPLE TO BLOOD BANK

## 2020-08-14 LAB — FERRITIN: Ferritin: 811 ng/mL — ABNORMAL HIGH (ref 11–307)

## 2020-08-14 MED ORDER — HEPARIN SOD (PORK) LOCK FLUSH 100 UNIT/ML IV SOLN
500.0000 [IU] | Freq: Once | INTRAVENOUS | Status: AC
  Administered 2020-08-14: 500 [IU]
  Filled 2020-08-14: qty 5

## 2020-08-14 MED ORDER — SODIUM CHLORIDE 0.9% FLUSH
10.0000 mL | Freq: Once | INTRAVENOUS | Status: AC
  Administered 2020-08-14: 10 mL
  Filled 2020-08-14: qty 10

## 2020-08-14 NOTE — Telephone Encounter (Signed)
Scheduled per los. Gave avs and calendar  

## 2020-08-14 NOTE — Progress Notes (Signed)
Critical Value AST 211 Dr Burr Medico notified.  Per Dr Burr Medico Bethany [name] is to avoid tylenol and alcohol. She verbalized understanding She states she takes percocet 1 tablet 4 times a day and she takes additional tylenol for h/a. She states she will stop taking extra tylenol.  I recommended she make sure she is well hydrated and use heat for h/a She also states she had an alcoholic beverage last pm.

## 2020-08-20 ENCOUNTER — Ambulatory Visit: Payer: Medicare Other | Admitting: Adult Health

## 2020-08-22 DIAGNOSIS — Z79899 Other long term (current) drug therapy: Secondary | ICD-10-CM | POA: Diagnosis not present

## 2020-08-22 DIAGNOSIS — M549 Dorsalgia, unspecified: Secondary | ICD-10-CM | POA: Diagnosis not present

## 2020-08-22 DIAGNOSIS — M62830 Muscle spasm of back: Secondary | ICD-10-CM | POA: Diagnosis not present

## 2020-08-22 DIAGNOSIS — M109 Gout, unspecified: Secondary | ICD-10-CM | POA: Diagnosis not present

## 2020-08-22 DIAGNOSIS — G8929 Other chronic pain: Secondary | ICD-10-CM | POA: Diagnosis not present

## 2020-08-24 ENCOUNTER — Encounter (HOSPITAL_COMMUNITY): Payer: Self-pay

## 2020-08-24 ENCOUNTER — Ambulatory Visit (HOSPITAL_COMMUNITY): Payer: Medicare Other

## 2020-08-24 ENCOUNTER — Other Ambulatory Visit: Payer: Self-pay

## 2020-08-24 ENCOUNTER — Ambulatory Visit (HOSPITAL_COMMUNITY)
Admission: RE | Admit: 2020-08-24 | Discharge: 2020-08-24 | Disposition: A | Discharge status: Home / Self Care | Payer: Medicare Other | Source: Ambulatory Visit | Attending: Interventional Radiology | Admitting: Interventional Radiology

## 2020-08-24 DIAGNOSIS — I671 Cerebral aneurysm, nonruptured: Secondary | ICD-10-CM

## 2020-08-24 NOTE — Progress Notes (Signed)
Spoke with Dr Carlis Abbott about pt currently on iron infusion. Last infusion of Venofer on 08/14/20. Per Dr Carlis Abbott reschedule pt till iron infusion is further out. Called and left a message for Anderson Malta, Dr Arlean Hopping scheduler. Awaiting call back to inform her.

## 2020-08-25 ENCOUNTER — Other Ambulatory Visit: Payer: Self-pay | Admitting: Hematology

## 2020-08-25 ENCOUNTER — Ambulatory Visit
Admission: RE | Admit: 2020-08-25 | Discharge: 2020-08-25 | Disposition: A | Discharge status: Home / Self Care | Payer: Medicare Other | Source: Ambulatory Visit | Attending: Hematology | Admitting: Hematology

## 2020-08-25 DIAGNOSIS — N6089 Other benign mammary dysplasias of unspecified breast: Secondary | ICD-10-CM | POA: Diagnosis not present

## 2020-08-25 DIAGNOSIS — N6310 Unspecified lump in the right breast, unspecified quadrant: Secondary | ICD-10-CM

## 2020-08-25 DIAGNOSIS — N6315 Unspecified lump in the right breast, overlapping quadrants: Secondary | ICD-10-CM | POA: Diagnosis not present

## 2020-08-26 ENCOUNTER — Encounter: Payer: Self-pay | Admitting: Adult Health

## 2020-08-26 ENCOUNTER — Ambulatory Visit (INDEPENDENT_AMBULATORY_CARE_PROVIDER_SITE_OTHER): Payer: Medicare Other | Admitting: Adult Health

## 2020-08-26 VITALS — BP 131/64 | HR 60 | Ht 65.0 in | Wt 173.0 lb

## 2020-08-26 DIAGNOSIS — I671 Cerebral aneurysm, nonruptured: Secondary | ICD-10-CM | POA: Diagnosis not present

## 2020-08-26 DIAGNOSIS — I639 Cerebral infarction, unspecified: Secondary | ICD-10-CM
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
## 2020-08-26 NOTE — Patient Instructions (Signed)
Continue aspirin 81 mg daily  and atorvastatin for secondary stroke prevention  Continue to follow up with PCP regarding cholesterol, blood pressure and diabetes management  Maintain strict control of hypertension with blood pressure goal below 130/90, diabetes with hemoglobin A1c goal below 7% and cholesterol with LDL cholesterol (bad cholesterol) goal below 70 mg/dL.       Followup in the future with me in 6 months or call earlier if needed       Thank you for coming to see [place] at [facility]. I hope we have been able to provide you high quality care today.  You may receive a patient satisfaction survey over the next few weeks. We would appreciate your feedback and comments so that we may continue to improve ourselves and the health of our patients.

## 2020-08-26 NOTE — Progress Notes (Incomplete)
[place]   [phone]) [phone] Fax:([phone]) [phone]   Clinic Follow up Note   Patient Care Team: Wenda Low, MD as PCP - General (Internal Medicine) O'Neal, Cassie Freer, MD as PCP - Cardiology (Cardiology) Truitt Merle, MD as Consulting Physician (Hematology) Angelia Mould, MD as Consulting Physician (Vascular Surgery) Koleen Distance, MD as Referring Physician (Gastroenterology)  Date of Service:  08/26/2020  CHIEF COMPLAINT: f/u of anemiaand abnormal image findings   CURRENT THERAPY:  IVFeraheme as needed (if ferritin <100). Changed to IV Venofer on 01/02/19. Due to recent GI bleeding, we increased Venofer to every 2 weeks on 05/22/20.  INTERVAL HISTORY: *** Bethany [name] is here for a follow up of anemia. She was last seen by me on 08/14/20. She presents to the clinic {alone/accompanied by}.  REVIEW OF SYSTEMS:  *** Constitutional: Denies fevers, chills or abnormal weight loss Eyes: Denies blurriness of vision Ears, nose, mouth, throat, and face: Denies mucositis or sore throat Respiratory: Denies cough, dyspnea or wheezes Cardiovascular: Denies palpitation, chest discomfort or lower extremity swelling Gastrointestinal:  Denies nausea, heartburn or change in bowel habits Skin: Denies abnormal skin rashes Lymphatics: Denies new lymphadenopathy or easy bruising Neurological:Denies numbness, tingling or new weaknesses Behavioral/Psych: Mood is stable, no new changes  All other systems were reviewed with the patient and are negative.  MEDICAL HISTORY:  Past Medical History:  Diagnosis Date  . Acute renal failure ([place])   . Anemia   . Arthritis   . COPD (chronic obstructive pulmonary disease) ([place])   . Diabetes mellitus without complication ([place])    type II   . GERD (gastroesophageal reflux disease)   . Hypercalcemia   . Hyperlipidemia   . Hypertension   . Pneumonia   . Renal disorder   . Single kidney   . Stroke [facility])    July 2021  .  Thrombosis   . Tobacco abuse     SURGICAL HISTORY: Past Surgical History:  Procedure Laterality Date  . ABDOMINAL HYSTERECTOMY    . blood clots removed from descending aorta     . CHOLECYSTECTOMY    . COLONOSCOPY    . COLONOSCOPY WITH PROPOFOL N/A 04/17/2017   Procedure: COLONOSCOPY WITH PROPOFOL;  Surgeon: Wilford Corner, MD;  Location: [facility];  Service: Endoscopy;  Laterality: N/A;  . ESOPHAGOGASTRODUODENOSCOPY (EGD) WITH PROPOFOL N/A 04/17/2017   Procedure: ESOPHAGOGASTRODUODENOSCOPY (EGD) WITH PROPOFOL;  Surgeon: Wilford Corner, MD;  Location: [facility];  Service: Endoscopy;  Laterality: N/A;  . IR 3D INDEPENDENT WKST  05/04/2020  . IR ANGIO INTRA EXTRACRAN SEL COM CAROTID INNOMINATE BILAT MOD SED  02/06/2020  . IR ANGIO INTRA EXTRACRAN SEL INTERNAL CAROTID UNI L MOD SED  05/04/2020  . IR ANGIO VERTEBRAL SEL VERTEBRAL BILAT MOD SED  02/06/2020  . IR ANGIOGRAM FOLLOW UP STUDY  05/04/2020  . IR IMAGING GUIDED PORT INSERTION  05/01/2019  . IR NEURO EACH ADD'L AFTER BASIC UNI LEFT (MS)  05/04/2020  . IR RADIOLOGIST EVAL & MGMT  05/28/2020  . IR TRANSCATH/EMBOLIZ  05/04/2020  . IR US GUIDE VASC ACCESS RIGHT  02/06/2020  . IR US GUIDE VASC ACCESS RIGHT  05/04/2020  . NEPHRECTOMY RECIPIENT    . RADIOLOGY WITH ANESTHESIA N/A 05/04/2020   Procedure: Sheppard Plumber;  Surgeon: Luanne Bras, MD;  Location: [facility];  Service: Radiology;  Laterality: N/A;    I have reviewed the social history and family history with the patient and they are unchanged from previous note.  ALLERGIES:  is  allergic to contrast media [iodinated diagnostic agents].  MEDICATIONS:  Current Outpatient Medications  Medication Sig Dispense Refill  . allopurinol (ZYLOPRIM) 100 MG tablet Take 100 mg by mouth at bedtime.    Marland Kitchen amLODipine (NORVASC) 10 MG tablet Take 10 mg by mouth daily.    . APPLE CIDER VINEGAR PO Take 1 tablet by mouth daily.    Marland Kitchen aspirin EC 81 MG EC tablet Take 1 tablet (81 mg total) by  mouth daily. Swallow whole. 30 tablet 11  . atorvastatin (LIPITOR) 80 MG tablet Take 1 tablet (80 mg total) by mouth daily. 30 tablet 11  . baclofen (LIORESAL) 10 MG tablet Take 10 mg by mouth 3 (three) times daily as needed for muscle spasms.    . buprenorphine (BUTRANS) 20 MCG/HR PTWK Place 1 patch onto the skin every Monday.     . clopidogrel (PLAVIX) 75 MG tablet Take 75 mg by mouth daily.    . Dulaglutide (TRULICITY) 1.5 DJ/5.7SV SOPN Inject 1.5 mg into the skin every Monday.     . ezetimibe (ZETIA) 10 MG tablet Take 10 mg by mouth daily.    . feeding supplement, ENSURE ENLIVE, (ENSURE ENLIVE) LIQD Take 237 mLs by mouth 2 (two) times daily between meals. 237 mL 12  . furosemide (LASIX) 40 MG tablet Take 1 tablet (40 mg total) by mouth daily as needed. (Patient taking differently: Take 40 mg by mouth daily as needed for fluid.) 45 tablet 3  . gabapentin (NEURONTIN) 100 MG capsule Take 2 capsules (200 mg total) by mouth 4 (four) times daily. 240 capsule 3  . insulin glargine, 1 Unit Dial, (TOUJEO SOLOSTAR) 300 UNIT/ML Solostar Pen Inject 20 Units into the skin at bedtime.     . Ipratropium-Albuterol (COMBIVENT) 20-100 MCG/ACT AERS respimat Inhale 1 puff into the lungs every 6 (six) hours as needed for wheezing or shortness of breath. 1 Inhaler 0  . levothyroxine (SYNTHROID) 75 MCG tablet Take 75 mcg by mouth daily.    Marland Kitchen lidocaine-prilocaine (EMLA) cream Apply 1 application topically as needed. (Patient taking differently: Apply 1 application topically daily as needed (port access).) 30 g 1  . losartan (COZAAR) 25 MG tablet Take 1 tablet (25 mg total) by mouth daily. 30 tablet 11  . metoprolol (LOPRESSOR) 50 MG tablet Take 50 mg by mouth 2 (two) times daily.     . Multiple Vitamin (MULTIVITAMIN WITH MINERALS) TABS tablet Take 1 tablet by mouth daily.    Marland Kitchen NARCAN 4 MG/0.1ML LIQD nasal spray kit Place 1 spray into the nose as needed for opioid reversal.    . oxyCODONE-acetaminophen (PERCOCET)  10-325 MG tablet Take 1 tablet by mouth every 6 (six) hours as needed (breakthrough pain).     . pantoprazole (PROTONIX) 40 MG tablet Take 40 mg by mouth every morning.    . pregabalin (LYRICA) 25 MG capsule Take 25 mg by mouth 2 (two) times daily.     . sennosides-docusate sodium (SENOKOT-S) 8.6-50 MG tablet Take 1 tablet by mouth at bedtime.    . Vitamin D, Ergocalciferol, (DRISDOL) 1.25 MG (50000 UNIT) CAPS capsule Take 50,000 Units by mouth every Monday.     Marland Kitchen XTAMPZA ER 27 MG C12A Take 27 mg by mouth 2 (two) times daily.      No current facility-administered medications for this visit.    PHYSICAL EXAMINATION: ECOG PERFORMANCE STATUS: {CHL ONC ECOG PS:[phone]}  There were no vitals filed for this visit. There were no vitals filed for this visit. ***  GENERAL:alert, no distress and comfortable SKIN: skin color, texture, turgor are normal, no rashes or significant lesions EYES: normal, Conjunctiva are pink and non-injected, sclera clear {OROPHARYNX:no exudate, no erythema and lips, buccal mucosa, and tongue normal}  NECK: supple, thyroid normal size, non-tender, without nodularity LYMPH:  no palpable lymphadenopathy in the cervical, axillary {or inguinal} LUNGS: clear to auscultation and percussion with normal breathing effort HEART: regular rate & rhythm and no murmurs and no lower extremity edema ABDOMEN:abdomen soft, non-tender and normal bowel sounds Musculoskeletal:no cyanosis of digits and no clubbing  NEURO: alert & oriented x 3 with fluent speech, no focal motor/sensory deficits  LABORATORY DATA:  I have reviewed the data as listed CBC Latest Ref Rng & Units 08/14/2020 07/31/2020 07/17/2020  WBC 4.0 - 10.5 K/uL 6.3 7.7 9.2  Hemoglobin 12.0 - 15.0 g/dL 10.1(L) 10.6(L) 10.8(L)  Hematocrit 36.0 - 46.0 % 33.4(L) 35.3(L) 36.4  Platelets 150 - 400 K/uL 177 227 233     CMP Latest Ref Rng & Units 08/14/2020 05/29/2020 05/08/2020  Glucose 70 - 99 mg/dL 185(H) 247(H) 219(H)  BUN 8  - 23 mg/dL 32(H) 27(H) 15  Creatinine 0.44 - 1.00 mg/dL 1.55(H) 1.43(H) 1.08(H)  Sodium 135 - 145 mmol/L 139 138 139  Potassium 3.5 - 5.1 mmol/L 4.1 4.8 4.4  Chloride 98 - 111 mmol/L 106 109 108  CO2 22 - 32 mmol/L '23 24 24  ' Calcium 8.9 - 10.3 mg/dL 9.6 9.5 9.9  Total Protein 6.5 - 8.1 g/dL 6.5 5.9(L) -  Total Bilirubin 0.3 - 1.2 mg/dL 0.3 0.2(L) -  Alkaline Phos 38 - 126 U/L 249(H) 135(H) -  AST 15 - 41 U/L 211(HH) 18 -  ALT 0 - 44 U/L 166(H) 15 -      RADIOGRAPHIC STUDIES: I have personally reviewed the radiological images as listed and agreed with the findings in the report. MM CLIP PLACEMENT RIGHT  Result Date: 08/25/2020 CLINICAL DATA:  Status post ultrasound-guided core biopsy of RIGHT breast mass. EXAM: DIAGNOSTIC RIGHT MAMMOGRAM POST ULTRASOUND BIOPSY COMPARISON:  Previous exam(s). FINDINGS: Mammographic images were obtained following ultrasound guided biopsy of mass in the 9 o'clock location of the RIGHT breast and placement of a coil shaped clip. The biopsy marking clip is in expected position at the site of biopsy followup in the previously evaluated asymmetry. IMPRESSION: Appropriate positioning of the coil shaped biopsy marking clip at the site of biopsy in the LATERAL portion of the RIGHT breast. Final Assessment: Post Procedure Mammograms for Marker Placement Electronically Signed   By: Nolon Nations M.D.   On: 08/25/2020 12:57   [place] RT BREAST BX W LOC DEV 1ST LESION IMG BX SPEC US GUIDE  Result Date: 08/25/2020 CLINICAL DATA:  Patient presents for ultrasound-guided core biopsy of mass in the 9 o'clock location of the RIGHT breast. EXAM: ULTRASOUND GUIDED RIGHT BREAST CORE NEEDLE BIOPSY COMPARISON:  Previous exam(s). PROCEDURE: I met with the patient and we discussed the procedure of ultrasound-guided biopsy, including benefits and alternatives. We discussed the high likelihood of a successful procedure. We discussed the risks of the procedure, including infection, bleeding,  tissue injury, clip migration, and inadequate sampling. Informed written consent was given. The usual time-out protocol was performed immediately prior to the procedure. Prior to the biopsy, targeted ultrasound is performed, again showing the small lesion in the 9 o'clock location RIGHT breast 5 centimeters from the nipple. Additionally, an oval circumscribed mass in the 8:30 o'clock location of the RIGHT breast 7 centimeters from nipple  correlates well with the previously biopsied mass, which was benign and performed in [place] many years ago. This mass measures 2.0 x 1.1 x 1.4 centimeters and is not palpable. Lesion quadrant: 9 o'clock location RIGHT breast Using sterile technique and 1% Lidocaine as local anesthetic, under direct ultrasound visualization, a 12 gauge coaxial spring-loaded device was used to perform biopsy of mass in the 9 o'clock location of the RIGHT breast using a inferior to superior approach. At the conclusion of the procedure coil shaped tissue marker clip was deployed into the biopsy cavity. Follow up 2 view mammogram was performed and dictated separately. IMPRESSION: Ultrasound guided biopsy of RIGHT breast mass. No apparent complications. Electronically Signed   By: Nolon Nations M.D.   On: 08/25/2020 12:26     ASSESSMENT & PLAN: *** Bethany [name] is a 70 y.o. female with   1. Right breast mass, Left Lung nodules concerning for malignancy -For Lung cancer screening, her CT Chest from 07/12/19 shows Stable 7 mm nodule in left lower lobe. Also shows New 4 mm nodule is noted in left upper lobe and 12 mm sub solid density is noted in left lung apex. -Her CT Chest from 07/13/20 showedStable small pulmonary nodule in the left lower lobe. Scan also showsNew bilateral pulmonary nodules including a dominant 1.3 cm pulmonary nodule in the left upper lobe. Findings are concerning forprimary lung cancer ormetastatic disease. -Her PET from 3/15/22showsScattered pulmonary nodules, the  dominant nodule inleft lungshowing mild hypermetabolic features. Findings may reflect metastatic disease. RIGHT breast mass with low level FDG uptake is nonspecific, also raising the question of neoplasm. -Pt notes h/o benign right breast fibroadenoma, biopsied in 2014, stable on 05/2017 mammogram. She also notes having liver lesion in [place] and Dr Hilarie Fredrickson is monitoring. Appeared benign on 01/2019 US abdomen. These were not seen on PET.  -Biopsy of left lung was attempted by Dr Melvyn Novas butheldgiven difficult location.  -Her 08/07/20 Mammogram Suspicious 7 mm mass involving the outer RIGHT breast at the 9 o'clock position approximately 5 cm from the nipple. Her known benign right breast cyst is present.  -Right breast biopsy on 08/25/20***    2. 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 She has required blood transfusions in the past in 2013 and in 2018 and frequent hospital admission for anemia. -She has intermittent epistaxis, but no skin or mucosa telangiectasia, no significant family history of bleeding disorder, she does not meet the diagnosis criteria for hereditary hemorrhagic telangiectasia. -Her last colonoscopy/endoscopy was around end of 2019 at [facility]. -She is not on oral iron, due to the lack of benefit. -Currently being treated withIV Iron to prevent significant anemiawith goal of Ferritin 100-200 range. She was switched to IV Venofer on 8/19/20due to her insurance coverage.Increased to 463mwith each infusionfrom 02/14/20.Due to recent GI bleeding, we increased Venofer to every1-2 weeks on 05/22/20.  -Her recent severe anemia has improved with frequentIV Iron and holding Plavix.She is more fatigued again, but no sign of GI bleeding. ***  -Labs reviewed today and stable. Hg 10.1, iron panel still pending. No IV Venofer or  blood transfusion today -Continue4021menoferevery2weeksif needed. Will hold Venofer if previous ferritin>300    3.SmokingHistory -She has been smokingintermittentlyfor 40 years.Then increased smoking daily after her mother passed in 2021. -IJaneal Holmesepeatedly encouraged her to continue to try to quit completely, she is willing  to try. -She has had lung nodules on Lung cancer screening CT. Most recent 07/13/20 CT chest does show concern for malignancy in lung.   4. Chronic GI Bleeding, intermittent epistaxis, constipation -Has intestinal AVM history  -Hercolonoscopy/EGD by Dr. Michail Sermon on 04/17/17 showed gastritis, diverticulosis and internal hemorrhoids, all benign with no obvious signs of bleeding at that time.She had repeat exams with Dr Rodena Piety end of 2019. I will obtain report. -She does not meet the diagnosis criteria for hereditary hemorrhagic telangiectasia (HHT) -After 04/2020 hospitalization she was initially constipated then has had black stool since.With holding Plavix, She has less black stool. She is fine to continue baby aspirin.  5. H/o of arterial thrombosis,Ischemic stroke 11/2019, Left Brain Aneurysm (49m and 217m -S/p unilateral nephrectomy due to damage fromarterialthrombosis -Managed by vascular physician, Dr. DiJoyce Grosseurologist Dr SeLeonie ManPrevious work-up for lupus anticoagulantand antiphospholipid syndromewerenegative, normal protein S and C level. -She notes she still has mild speech deficit (difficulty finding her words).  -On 02/06/20 Dr DeAllegra Granaound a second brain aneurysm.She underwent embolization surgery on 05/04/20 -After 04/2020 procedure she has restarted baby aspirin and Plavix until her next neuro f/u.Given increased GI bleeding and worsened anemia, we have held Plavix since 05/22/20. She is fine to continue baby aspirin.  6. COPD, HTN, CKD stage III,DM with Peripheral Neuropathy,Chronic Back Pain  -She is currently on  60065mabapentin 4 times a day. -[facility].    PLAN: -We reviewed her breast US [place] mammogram findings  -Right breast biopsy on 08/25/20. -Continue to hold Plavix.  -Labs reviewed, no IV Venofer today -Lab and f/u in 2 weeks, continue Venofer q2weeks if needed    ***  No problem-specific Assessment & Plan notes found for this encounter.   No orders of the defined types were placed in this encounter.  All questions were answered. The patient knows to call the clinic with any problems, questions or concerns. No barriers to learning was detected. The total time spent in the appointment was {CHL ONC TIME VISIT - SWIYELYH:[id number]}   KatAurea Graff13/2022   I, KatWilburn Mylarm acting as scribe for YanTruitt MerleD.   {Add scribe attestation statement}

## 2020-08-27 ENCOUNTER — Inpatient Hospital Stay (HOSPITAL_BASED_OUTPATIENT_CLINIC_OR_DEPARTMENT_OTHER): Payer: Medicare Other | Admitting: Hematology

## 2020-08-27 ENCOUNTER — Inpatient Hospital Stay: Payer: Medicare Other | Admitting: Hematology

## 2020-08-27 ENCOUNTER — Inpatient Hospital Stay: Payer: Medicare Other

## 2020-08-27 DIAGNOSIS — R911 Solitary pulmonary nodule: Secondary | ICD-10-CM | POA: Diagnosis not present

## 2020-08-27 NOTE — Progress Notes (Signed)
[place]   [phone] [phone]   Clinic Follow up Note   Patient Care Team: Wenda Low, MD as PCP - General (Internal Medicine) O'Neal, Cassie Freer, MD as PCP - Cardiology (Cardiology) Truitt Merle, MD as Consulting Physician (Hematology) Angelia Mould, MD as Consulting Physician (Vascular Surgery) Koleen Distance, MD as Referring Physician (Gastroenterology)  Date of Service:  08/27/2020  CHIEF COMPLAINT: f/u of anemia  I connected with Bethany [name] on 08/27/2020 at 10:20 AM EDT by telephone visit and verified that I am speaking with the correct person using two identifiers.  I discussed the limitations, risks, security and privacy concerns of performing an evaluation and management service by telephone and the availability of in person appointments. I also discussed with the patient that there may be a patient responsible charge related to this service. The patient expressed understanding and agreed to proceed.   Other persons participating in the visit and their role in the encounter:  Katie, scribe  Patient's location:  home Provider's location:  My office   CURRENT THERAPY:  IVFeraheme as needed (if ferritin <100). Changed to IV Venofer on 01/02/19. Due to recent GI bleeding, we increased Venofer to every 2 weeks on 05/22/20.  INTERVAL HISTORY:  Bethany [name] was contacted for a follow up of anemia. She was last seen by me on 08/14/20.  She notes she recently got back from a trip to [place], during which she walked every day for about 5 hours a day. She notes she is losing weight.  REVIEW OF SYSTEMS:   Constitutional: Denies fevers, chills or abnormal weight loss Eyes: Denies blurriness of vision Ears, nose, mouth, throat, and face: Denies mucositis or sore throat Respiratory: Denies cough, dyspnea or wheezes Cardiovascular: Denies palpitation, chest discomfort or lower extremity swelling Gastrointestinal:  Denies nausea,  heartburn or change in bowel habits Skin: Denies abnormal skin rashes Lymphatics: Denies new lymphadenopathy or easy bruising Neurological:Denies numbness, tingling or new weaknesses Behavioral/Psych: Mood is stable, no new changes  All other systems were reviewed with the patient and are negative.  MEDICAL HISTORY:  Past Medical History:  Diagnosis Date  . Acute renal failure ([place])   . Anemia   . Arthritis   . COPD (chronic obstructive pulmonary disease) ([place])   . Diabetes mellitus without complication ([place])    type II   . GERD (gastroesophageal reflux disease)   . Hypercalcemia   . Hyperlipidemia   . Hypertension   . Pneumonia   . Renal disorder   . Single kidney   . Stroke [facility])    July 2021  . Thrombosis   . Tobacco abuse     SURGICAL HISTORY: Past Surgical History:  Procedure Laterality Date  . ABDOMINAL HYSTERECTOMY    . blood clots removed from descending aorta     . CHOLECYSTECTOMY    . COLONOSCOPY    . COLONOSCOPY WITH PROPOFOL N/A 04/17/2017   Procedure: COLONOSCOPY WITH PROPOFOL;  Surgeon: Wilford Corner, MD;  Location: [facility];  Service: Endoscopy;  Laterality: N/A;  . ESOPHAGOGASTRODUODENOSCOPY (EGD) WITH PROPOFOL N/A 04/17/2017   Procedure: ESOPHAGOGASTRODUODENOSCOPY (EGD) WITH PROPOFOL;  Surgeon: Wilford Corner, MD;  Location: [facility];  Service: Endoscopy;  Laterality: N/A;  . IR 3D INDEPENDENT WKST  05/04/2020  . IR ANGIO INTRA EXTRACRAN SEL COM CAROTID INNOMINATE BILAT MOD SED  02/06/2020  . IR ANGIO INTRA EXTRACRAN SEL INTERNAL CAROTID UNI L MOD SED  05/04/2020  . IR ANGIO VERTEBRAL SEL VERTEBRAL BILAT  MOD SED  02/06/2020  . IR ANGIOGRAM FOLLOW UP STUDY  05/04/2020  . IR IMAGING GUIDED PORT INSERTION  05/01/2019  . IR NEURO EACH ADD'L AFTER BASIC UNI LEFT (MS)  05/04/2020  . IR RADIOLOGIST EVAL & MGMT  05/28/2020  . IR TRANSCATH/EMBOLIZ  05/04/2020  . IR US GUIDE VASC ACCESS RIGHT  02/06/2020  . IR US GUIDE VASC ACCESS RIGHT  05/04/2020   . NEPHRECTOMY RECIPIENT    . RADIOLOGY WITH ANESTHESIA N/A 05/04/2020   Procedure: Sheppard Plumber;  Surgeon: Luanne Bras, MD;  Location: [facility];  Service: Radiology;  Laterality: N/A;    I have reviewed the social history and family history with the patient and they are unchanged from previous note.  ALLERGIES:  is allergic to contrast media [iodinated diagnostic agents].  MEDICATIONS:  Current Outpatient Medications  Medication Sig Dispense Refill  . allopurinol (ZYLOPRIM) 100 MG tablet Take 100 mg by mouth at bedtime.    Marland Kitchen amLODipine (NORVASC) 10 MG tablet Take 10 mg by mouth daily.    . APPLE CIDER VINEGAR PO Take 1 tablet by mouth daily.    Marland Kitchen aspirin EC 81 MG EC tablet Take 1 tablet (81 mg total) by mouth daily. Swallow whole. 30 tablet 11  . atorvastatin (LIPITOR) 80 MG tablet Take 1 tablet (80 mg total) by mouth daily. 30 tablet 11  . baclofen (LIORESAL) 10 MG tablet Take 10 mg by mouth 3 (three) times daily as needed for muscle spasms.    . buprenorphine (BUTRANS) 20 MCG/HR PTWK Place 1 patch onto the skin every Monday.     . clopidogrel (PLAVIX) 75 MG tablet Take 75 mg by mouth daily.    . Dulaglutide (TRULICITY) 1.5 FY/1.0FB SOPN Inject 1.5 mg into the skin every Monday.     . ezetimibe (ZETIA) 10 MG tablet Take 10 mg by mouth daily.    . feeding supplement, ENSURE ENLIVE, (ENSURE ENLIVE) LIQD Take 237 mLs by mouth 2 (two) times daily between meals. 237 mL 12  . furosemide (LASIX) 40 MG tablet Take 1 tablet (40 mg total) by mouth daily as needed. (Patient taking differently: Take 40 mg by mouth daily as needed for fluid.) 45 tablet 3  . gabapentin (NEURONTIN) 100 MG capsule Take 2 capsules (200 mg total) by mouth 4 (four) times daily. 240 capsule 3  . insulin glargine, 1 Unit Dial, (TOUJEO SOLOSTAR) 300 UNIT/ML Solostar Pen Inject 20 Units into the skin at bedtime.     . Ipratropium-Albuterol (COMBIVENT) 20-100 MCG/ACT AERS respimat Inhale 1 puff into the lungs every 6 (six)  hours as needed for wheezing or shortness of breath. 1 Inhaler 0  . levothyroxine (SYNTHROID) 75 MCG tablet Take 75 mcg by mouth daily.    Marland Kitchen lidocaine-prilocaine (EMLA) cream Apply 1 application topically as needed. (Patient taking differently: Apply 1 application topically daily as needed (port access).) 30 g 1  . losartan (COZAAR) 25 MG tablet Take 1 tablet (25 mg total) by mouth daily. 30 tablet 11  . metoprolol (LOPRESSOR) 50 MG tablet Take 50 mg by mouth 2 (two) times daily.     . Multiple Vitamin (MULTIVITAMIN WITH MINERALS) TABS tablet Take 1 tablet by mouth daily.    Marland Kitchen NARCAN 4 MG/0.1ML LIQD nasal spray kit Place 1 spray into the nose as needed for opioid reversal.    . oxyCODONE-acetaminophen (PERCOCET) 10-325 MG tablet Take 1 tablet by mouth every 6 (six) hours as needed (breakthrough pain).     . pantoprazole (PROTONIX)  40 MG tablet Take 40 mg by mouth every morning.    . pregabalin (LYRICA) 25 MG capsule Take 25 mg by mouth 2 (two) times daily.     . sennosides-docusate sodium (SENOKOT-S) 8.6-50 MG tablet Take 1 tablet by mouth at bedtime.    . Vitamin D, Ergocalciferol, (DRISDOL) 1.25 MG (50000 UNIT) CAPS capsule Take 50,000 Units by mouth every Monday.     Marland Kitchen XTAMPZA ER 27 MG C12A Take 27 mg by mouth 2 (two) times daily.      No current facility-administered medications for this visit.    PHYSICAL EXAMINATION: ECOG PERFORMANCE STATUS: 1 - Symptomatic but completely ambulatory  There were no vitals filed for this visit. There were no vitals filed for this visit.  No vitals taken today, Exam not performed today  LABORATORY DATA:  I have reviewed the data as listed CBC Latest Ref Rng & Units 08/14/2020 07/31/2020 07/17/2020  WBC 4.0 - 10.5 K/uL 6.3 7.7 9.2  Hemoglobin 12.0 - 15.0 g/dL 10.1(L) 10.6(L) 10.8(L)  Hematocrit 36.0 - 46.0 % 33.4(L) 35.3(L) 36.4  Platelets 150 - 400 K/uL 177 227 233     CMP Latest Ref Rng & Units 08/14/2020 05/29/2020 05/08/2020  Glucose 70 - 99 mg/dL  185(H) 247(H) 219(H)  BUN 8 - 23 mg/dL 32(H) 27(H) 15  Creatinine 0.44 - 1.00 mg/dL 1.55(H) 1.43(H) 1.08(H)  Sodium 135 - 145 mmol/L 139 138 139  Potassium 3.5 - 5.1 mmol/L 4.1 4.8 4.4  Chloride 98 - 111 mmol/L 106 109 108  CO2 22 - 32 mmol/L '23 24 24  ' Calcium 8.9 - 10.3 mg/dL 9.6 9.5 9.9  Total Protein 6.5 - 8.1 g/dL 6.5 5.9(L) -  Total Bilirubin 0.3 - 1.2 mg/dL 0.3 0.2(L) -  Alkaline Phos 38 - 126 U/L 249(H) 135(H) -  AST 15 - 41 U/L 211(HH) 18 -  ALT 0 - 44 U/L 166(H) 15 -      RADIOGRAPHIC STUDIES: I have personally reviewed the radiological images as listed and agreed with the findings in the report. MM CLIP PLACEMENT RIGHT  Result Date: 08/25/2020 CLINICAL DATA:  Status post ultrasound-guided core biopsy of RIGHT breast mass. EXAM: DIAGNOSTIC RIGHT MAMMOGRAM POST ULTRASOUND BIOPSY COMPARISON:  Previous exam(s). FINDINGS: Mammographic images were obtained following ultrasound guided biopsy of mass in the 9 o'clock location of the RIGHT breast and placement of a coil shaped clip. The biopsy marking clip is in expected position at the site of biopsy followup in the previously evaluated asymmetry. IMPRESSION: Appropriate positioning of the coil shaped biopsy marking clip at the site of biopsy in the LATERAL portion of the RIGHT breast. Final Assessment: Post Procedure Mammograms for Marker Placement Electronically Signed   By: Nolon Nations M.D.   On: 08/25/2020 12:57   [place] RT BREAST BX W LOC DEV 1ST LESION IMG BX SPEC US GUIDE  Result Date: 08/25/2020 CLINICAL DATA:  Patient presents for ultrasound-guided core biopsy of mass in the 9 o'clock location of the RIGHT breast. EXAM: ULTRASOUND GUIDED RIGHT BREAST CORE NEEDLE BIOPSY COMPARISON:  Previous exam(s). PROCEDURE: I met with the patient and we discussed the procedure of ultrasound-guided biopsy, including benefits and alternatives. We discussed the high likelihood of a successful procedure. We discussed the risks of the procedure,  including infection, bleeding, tissue injury, clip migration, and inadequate sampling. Informed written consent was given. The usual time-out protocol was performed immediately prior to the procedure. Prior to the biopsy, targeted ultrasound is performed, again showing the small lesion  in the 9 o'clock location RIGHT breast 5 centimeters from the nipple. Additionally, an oval circumscribed mass in the 8:30 o'clock location of the RIGHT breast 7 centimeters from nipple correlates well with the previously biopsied mass, which was benign and performed in [place] many years ago. This mass measures 2.0 x 1.1 x 1.4 centimeters and is not palpable. Lesion quadrant: 9 o'clock location RIGHT breast Using sterile technique and 1% Lidocaine as local anesthetic, under direct ultrasound visualization, a 12 gauge coaxial spring-loaded device was used to perform biopsy of mass in the 9 o'clock location of the RIGHT breast using a inferior to superior approach. At the conclusion of the procedure coil shaped tissue marker clip was deployed into the biopsy cavity. Follow up 2 view mammogram was performed and dictated separately. IMPRESSION: Ultrasound guided biopsy of RIGHT breast mass. No apparent complications. Electronically Signed   By: Nolon Nations M.D.   On: 08/25/2020 12:26     ASSESSMENT & PLAN:  Bethany [name] is a 70 y.o. female with   1. Right breast mass, Left Lung nodules concerning for malignancy -For Lung cancer screening, her CT Chest from 07/12/19 shows Stable 7 mm nodule in left lower lobe. Also shows New 4 mm nodule is noted in left upper lobe and 12 mm sub solid density is noted in left lung apex. -Her CT Chest from 07/13/20 showedStable small pulmonary nodule in the left lower lobe. Scan also showsNew bilateral pulmonary nodules including a dominant 1.3 cm pulmonary nodule in the left upper lobe. Findings are concerning forprimary lung cancer ormetastatic disease. -Her PET from  3/15/22showsScattered pulmonary nodules, the dominant nodule inleft lungshowing mild hypermetabolic features. Findings may reflect metastatic disease. RIGHT breast mass with low level FDG uptake is nonspecific, also raising the question of neoplasm. -Pt notes h/o benign right breast fibroadenoma, biopsied in 2014, stable on 05/2017 mammogram. She also notes having liver lesion in [place] and Dr Hilarie Fredrickson is monitoring. Appeared benign on 01/2019 US abdomen. These were not seen on PET.  -Biopsy of left lung was attempted by Dr Pascal Lux butheldgiven difficult location.  -Her 08/07/20 Mammogram Suspicious 7 mm mass involving the outer RIGHT breast at the 9 o'clock position approximately 5 cm from the nipple. Her known benign right breast cyst is present.  -She underwent right breast biopsy on 08/25/20 showing ductal papilloma with usual ductal hyperplasia -I reviewed that her lung lesions are not related to breast lesion. I recommend repeat chest CT in 1 month  2. 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 She has required blood transfusions in the past in 2013 and in 2018 and frequent hospital admission for anemia. -She has intermittent epistaxis, but no skin or mucosa telangiectasia, no significant family history of bleeding disorder, she does not meet the diagnosis criteria for hereditary hemorrhagic telangiectasia. -Her lastcolonoscopy/endoscopy was 04/2017. -She is not on oral iron, due to the lack of benefit. -Currently being treated withIV Iron to prevent significant anemiawith goal of Ferritin 100-200 range. She was switched to IV Venofer on 8/19/20due to her insurance coverage.Increased to 495mwith each infusionfrom 02/14/20.Due to recent GI bleeding, we increased Venofer to every1-2 weeks on 05/22/20.  -Her recent severe anemia has improved with  frequentIV Iron and holding Plavix. -Continue401menoferevery2weeksif needed. Will hold Venofer if previous ferritin>300 -[facility]  3.SmokingHistory -She has been smokingintermittentlyfor 40 years.Then increased smoking daily after her mother passed in 2021. Janeal Holmes repeatedly encouraged her to continue to try to quit completely, she is willing to try. -She has had lung nodules on Lung cancer screening CT. PET scan on 07/28/20 showed scattered pulmonary nodules with mild hypermetabolic features.  4. Chronic GI Bleeding, intermittent epistaxis, constipation -Has intestinal AVM history  -Hercolonoscopy/EGD by Dr. Michail Sermon on 04/17/17 showed gastritis, diverticulosis and internal hemorrhoids, all benign with no obvious signs of bleeding at that time.She had repeat exams with Dr Rodena Piety end of 2019. I will obtain report. -She does not meet the diagnosis criteria for hereditary hemorrhagic telangiectasia (HHT) -She has had no black stool  5. H/o of arterial thrombosis,Ischemic stroke 11/2019, Left Brain Aneurysm (34m and 267m -S/p unilateral nephrectomy due to damage fromarterialthrombosis -Managed by vascular physician, Dr. DiJoyce Grosseurologist Dr SeLeonie ManPrevious work-up for lupus anticoagulantand antiphospholipid syndromewerenegative, normal protein S and C level. -She notes she still has mild speech deficit (difficulty finding her words).  -On 02/06/20 Dr DeAllegra Granaound a second brain aneurysm.She underwent embolization surgery on 05/04/20 -After 04/2020 procedure she has restarted baby aspirin and Plavix until her next neuro f/u.Given increased GI bleeding and worsened anemia, we have held Plavix since 05/22/20. She is fine to continue baby aspirin. -She was scheduled for surveillance MRI on Monday, but this was cancelled due to iron infusion the same day.  6. COPD, HTN, CKD stage III,DM with  Peripheral Neuropathy,Chronic Back Pain  -She is currently on 600103mabapentin 4 times a day. -[facility].    PLAN: -We reviewed breast biopsy results -Continue to hold Plavix.  -continue Venofer if needed -Labs and flush next week to f/u LFTs -F/u with labs in 1 month, with chest CT prior to appointment    No problem-specific Assessment & Plan notes found for this encounter.   Orders Placed This Encounter  Procedures  . CT Chest Wo Contrast    Standing Status:   Future    Standing Expiration Date:   08/27/2021    Order Specific Question:   [place] imaging [place]?    [place]   [facility]    Standing Status:   Future    Standing Expiration Date:   08/27/2021    I discussed the assessment and treatment plan with the patient. The patient was provided an opportunity to ask questions and all were answered. The patient agreed with the plan and demonstrated an understanding of the instructions.  The patient was advised to call back or seek an in-person evaluation if the symptoms worsen or if the condition fails to improve as anticipated.  The total time spent in the appointment was 25 minutes.     YanTruitt MerleD 08/27/2020   I, KatWilburn Mylarm acting as scribe for YanTruitt MerleD.   I have reviewed the above documentation for accuracy and completeness, and I agree with the above.

## 2020-08-28 ENCOUNTER — Telehealth: Payer: Self-pay | Admitting: *Deleted

## 2020-08-28 ENCOUNTER — Encounter: Payer: Self-pay | Admitting: Hematology

## 2020-08-28 ENCOUNTER — Ambulatory Visit: Payer: Medicare Other | Admitting: Hematology

## 2020-08-28 ENCOUNTER — Encounter: Payer: Self-pay | Admitting: *Deleted

## 2020-08-28 NOTE — Telephone Encounter (Signed)
LM for Dr Estanislado Pandy stating radiologist recommends waiting 4-6 post IV iron infusion to do an MRI. Pt had her last IV iron om 08/14/20. To call us back if has questions

## 2020-08-31 ENCOUNTER — Telehealth: Payer: Self-pay | Admitting: Hematology

## 2020-08-31 NOTE — Telephone Encounter (Signed)
Scheduled follow-up appointments per 4/14 los. Patient is aware.

## 2020-09-03 ENCOUNTER — Inpatient Hospital Stay: Payer: Medicare Other

## 2020-09-03 ENCOUNTER — Telehealth: Payer: Self-pay

## 2020-09-03 ENCOUNTER — Other Ambulatory Visit: Payer: Self-pay

## 2020-09-03 VITALS — BP 128/61 | HR 57 | Temp 97.8°F | Resp 18

## 2020-09-03 DIAGNOSIS — R918 Other nonspecific abnormal finding of lung field: Secondary | ICD-10-CM | POA: Diagnosis not present

## 2020-09-03 DIAGNOSIS — N183 Chronic kidney disease, stage 3 unspecified: Secondary | ICD-10-CM | POA: Diagnosis not present

## 2020-09-03 DIAGNOSIS — D500 Iron deficiency anemia secondary to blood loss (chronic): Secondary | ICD-10-CM

## 2020-09-03 DIAGNOSIS — R911 Solitary pulmonary nodule: Secondary | ICD-10-CM

## 2020-09-03 DIAGNOSIS — K5520 Angiodysplasia of colon without hemorrhage: Secondary | ICD-10-CM

## 2020-09-03 DIAGNOSIS — N6310 Unspecified lump in the right breast, unspecified quadrant: Secondary | ICD-10-CM | POA: Diagnosis not present

## 2020-09-03 DIAGNOSIS — Q2733 Arteriovenous malformation of digestive system vessel: Secondary | ICD-10-CM | POA: Diagnosis not present

## 2020-09-03 DIAGNOSIS — K922 Gastrointestinal hemorrhage, unspecified: Secondary | ICD-10-CM | POA: Diagnosis not present

## 2020-09-03 DIAGNOSIS — I129 Hypertensive chronic kidney disease with stage 1 through stage 4 chronic kidney disease, or unspecified chronic kidney disease: Secondary | ICD-10-CM | POA: Diagnosis not present

## 2020-09-03 DIAGNOSIS — D649 Anemia, unspecified: Secondary | ICD-10-CM

## 2020-09-03 LAB — CMP (CANCER CENTER ONLY)
ALT: 137 U/L — ABNORMAL HIGH (ref 0–44)
AST: 232 U/L (ref 15–41)
Albumin: 3.8 g/dL (ref 3.5–5.0)
Alkaline Phosphatase: 270 U/L — ABNORMAL HIGH (ref 38–126)
Anion gap: 10 (ref 5–15)
BUN: 18 mg/dL (ref 8–23)
CO2: 26 mmol/L (ref 22–32)
Calcium: 10.1 mg/dL (ref 8.9–10.3)
Chloride: 100 mmol/L (ref 98–111)
Creatinine: 1.82 mg/dL — ABNORMAL HIGH (ref 0.44–1.00)
GFR, Estimated: 30 mL/min — ABNORMAL LOW (ref >60)
Glucose, Bld: 287 mg/dL — ABNORMAL HIGH (ref 70–99)
Potassium: 4.3 mmol/L (ref 3.5–5.1)
Sodium: 136 mmol/L (ref 135–145)
Total Bilirubin: 0.3 mg/dL (ref 0.3–1.2)
Total Protein: 6.8 g/dL (ref 6.5–8.1)

## 2020-09-03 LAB — IRON AND TIBC
Iron: 48 ug/dL (ref 41–142)
Saturation Ratios: 14 % — ABNORMAL LOW (ref 21–57)
TIBC: 351 ug/dL (ref 236–444)
UIBC: 303 ug/dL (ref 120–384)

## 2020-09-03 LAB — SAMPLE TO BLOOD BANK

## 2020-09-03 LAB — CBC WITH DIFFERENTIAL (CANCER CENTER ONLY)
Abs Immature Granulocytes: 0.05 10*3/uL (ref 0.00–0.07)
Basophils Absolute: 0 10*3/uL (ref 0.0–0.1)
Basophils Relative: 0 %
Eosinophils Absolute: 0.1 10*3/uL (ref 0.0–0.5)
Eosinophils Relative: 2 %
HCT: 34.8 % — ABNORMAL LOW (ref 36.0–46.0)
Hemoglobin: 10.4 g/dL — ABNORMAL LOW (ref 12.0–15.0)
Immature Granulocytes: 1 %
Lymphocytes Relative: 14 %
Lymphs Abs: 1.1 10*3/uL (ref 0.7–4.0)
MCH: 24.1 pg — ABNORMAL LOW (ref 26.0–34.0)
MCHC: 29.9 g/dL — ABNORMAL LOW (ref 30.0–36.0)
MCV: 80.7 fL (ref 80.0–100.0)
Monocytes Absolute: 0.8 10*3/uL (ref 0.1–1.0)
Monocytes Relative: 10 %
Neutro Abs: 5.6 10*3/uL (ref 1.7–7.7)
Neutrophils Relative %: 73 %
Platelet Count: 252 10*3/uL (ref 150–400)
RBC: 4.31 MIL/uL (ref 3.87–5.11)
RDW: 16.1 % — ABNORMAL HIGH (ref 11.5–15.5)
WBC Count: 7.6 10*3/uL (ref 4.0–10.5)
nRBC: 0 % (ref 0.0–0.2)

## 2020-09-03 LAB — FERRITIN: Ferritin: 369 ng/mL — ABNORMAL HIGH (ref 11–307)

## 2020-09-03 MED ORDER — SODIUM CHLORIDE 0.9% FLUSH
10.0000 mL | Freq: Once | INTRAVENOUS | Status: AC
  Administered 2020-09-03: 10 mL
  Filled 2020-09-03: qty 10

## 2020-09-03 MED ORDER — HEPARIN SOD (PORK) LOCK FLUSH 100 UNIT/ML IV SOLN
500.0000 [IU] | Freq: Once | INTRAVENOUS | Status: AC
  Administered 2020-09-03: 500 [IU]
  Filled 2020-09-03: qty 5

## 2020-09-03 NOTE — Patient Instructions (Signed)

## 2020-09-03 NOTE — Telephone Encounter (Signed)
I spoke with Bethany [name] and Per Dr Burr Medico she is to stop taking lipitor and drinking alcohol secondary to rising LFT's.  I advised her to call her PCP and GI tomorrow.  I will fax records to both.  She verbalized understanding.

## 2020-09-03 NOTE — Telephone Encounter (Signed)
CRITICAL VALUE STICKER  CRITICAL VALUE: AST: [id number]  RECEIVER (on-site recipient of call): Evette Georges, [place] NOTIFIED: 09/03/2020 11:44 am  MESSENGER (representative from lab): Ulice Dash  MD NOTIFIED: Dr. Burr Medico  TIME OF NOTIFICATION: 11:45 am  RESPONSE: Critical lab given to Ihor Gully, RN to give to Dr. Burr Medico.

## 2020-09-04 DIAGNOSIS — I509 Heart failure, unspecified: Secondary | ICD-10-CM | POA: Diagnosis not present

## 2020-09-04 DIAGNOSIS — N1831 Chronic kidney disease, stage 3a: Secondary | ICD-10-CM | POA: Diagnosis not present

## 2020-09-04 DIAGNOSIS — I10 Essential (primary) hypertension: Secondary | ICD-10-CM | POA: Diagnosis not present

## 2020-09-04 DIAGNOSIS — E1140 Type 2 diabetes mellitus with diabetic neuropathy, unspecified: Secondary | ICD-10-CM | POA: Diagnosis not present

## 2020-09-04 DIAGNOSIS — G459 Transient cerebral ischemic attack, unspecified: Secondary | ICD-10-CM | POA: Diagnosis not present

## 2020-09-04 DIAGNOSIS — J449 Chronic obstructive pulmonary disease, unspecified: Secondary | ICD-10-CM | POA: Diagnosis not present

## 2020-09-04 DIAGNOSIS — D509 Iron deficiency anemia, unspecified: Secondary | ICD-10-CM | POA: Diagnosis not present

## 2020-09-04 DIAGNOSIS — E7800 Pure hypercholesterolemia, unspecified: Secondary | ICD-10-CM | POA: Diagnosis not present

## 2020-09-04 DIAGNOSIS — I639 Cerebral infarction, unspecified: Secondary | ICD-10-CM | POA: Diagnosis not present

## 2020-09-04 DIAGNOSIS — G8929 Other chronic pain: Secondary | ICD-10-CM | POA: Diagnosis not present

## 2020-09-04 DIAGNOSIS — E039 Hypothyroidism, unspecified: Secondary | ICD-10-CM | POA: Diagnosis not present

## 2020-09-04 DIAGNOSIS — K219 Gastro-esophageal reflux disease without esophagitis: Secondary | ICD-10-CM | POA: Diagnosis not present

## 2020-09-07 NOTE — Progress Notes (Signed)
I agree with the above plan 

## 2020-09-08 ENCOUNTER — Telehealth: Payer: Self-pay

## 2020-09-08 NOTE — Telephone Encounter (Signed)
I spoke with Bethany [name] to make her aware of her CT scan date and time. Patient verbalized understanding.

## 2020-09-09 ENCOUNTER — Telehealth (HOSPITAL_COMMUNITY): Payer: Self-pay

## 2020-09-09 NOTE — Telephone Encounter (Signed)
Spoke to pt regarding MRI. She is unable to have it done because of her iron infusions. Will wait for her visit on 5/13. If it is scheduled out far enough, will try and squeeze in MRI between the next visit. Pt agreed with this plan. AW

## 2020-09-10 ENCOUNTER — Other Ambulatory Visit: Payer: Self-pay | Admitting: Hematology

## 2020-09-15 DIAGNOSIS — N1831 Chronic kidney disease, stage 3a: Secondary | ICD-10-CM | POA: Diagnosis not present

## 2020-09-23 DIAGNOSIS — Z79899 Other long term (current) drug therapy: Secondary | ICD-10-CM | POA: Diagnosis not present

## 2020-09-23 DIAGNOSIS — Z9181 History of falling: Secondary | ICD-10-CM | POA: Diagnosis not present

## 2020-09-23 DIAGNOSIS — M5137 Other intervertebral disc degeneration, lumbosacral region: Secondary | ICD-10-CM | POA: Diagnosis not present

## 2020-09-23 DIAGNOSIS — I2510 Atherosclerotic heart disease of native coronary artery without angina pectoris: Secondary | ICD-10-CM | POA: Diagnosis not present

## 2020-09-23 DIAGNOSIS — E1122 Type 2 diabetes mellitus with diabetic chronic kidney disease: Secondary | ICD-10-CM | POA: Diagnosis not present

## 2020-09-23 DIAGNOSIS — D500 Iron deficiency anemia secondary to blood loss (chronic): Secondary | ICD-10-CM | POA: Diagnosis not present

## 2020-09-23 DIAGNOSIS — J449 Chronic obstructive pulmonary disease, unspecified: Secondary | ICD-10-CM | POA: Diagnosis not present

## 2020-09-23 DIAGNOSIS — G894 Chronic pain syndrome: Secondary | ICD-10-CM | POA: Diagnosis not present

## 2020-09-23 DIAGNOSIS — I70203 Unspecified atherosclerosis of native arteries of extremities, bilateral legs: Secondary | ICD-10-CM | POA: Diagnosis not present

## 2020-09-23 DIAGNOSIS — E1169 Type 2 diabetes mellitus with other specified complication: Secondary | ICD-10-CM | POA: Diagnosis not present

## 2020-09-23 DIAGNOSIS — E785 Hyperlipidemia, unspecified: Secondary | ICD-10-CM | POA: Diagnosis not present

## 2020-09-23 DIAGNOSIS — M79604 Pain in right leg: Secondary | ICD-10-CM | POA: Diagnosis not present

## 2020-09-23 DIAGNOSIS — E559 Vitamin D deficiency, unspecified: Secondary | ICD-10-CM | POA: Diagnosis not present

## 2020-09-23 DIAGNOSIS — M79605 Pain in left leg: Secondary | ICD-10-CM | POA: Diagnosis not present

## 2020-09-23 DIAGNOSIS — F17210 Nicotine dependence, cigarettes, uncomplicated: Secondary | ICD-10-CM | POA: Diagnosis not present

## 2020-09-23 DIAGNOSIS — Z87891 Personal history of nicotine dependence: Secondary | ICD-10-CM | POA: Diagnosis not present

## 2020-09-23 DIAGNOSIS — E1140 Type 2 diabetes mellitus with diabetic neuropathy, unspecified: Secondary | ICD-10-CM | POA: Diagnosis not present

## 2020-09-23 DIAGNOSIS — E039 Hypothyroidism, unspecified: Secondary | ICD-10-CM | POA: Diagnosis not present

## 2020-09-23 DIAGNOSIS — I5032 Chronic diastolic (congestive) heart failure: Secondary | ICD-10-CM | POA: Diagnosis not present

## 2020-09-23 NOTE — Progress Notes (Signed)
[place]   [phone] [phone]   Clinic Follow up Note   Patient Care Team: Wenda Low, MD as PCP - General (Internal Medicine) O'Neal, Cassie Freer, MD as PCP - Cardiology (Cardiology) Truitt Merle, MD as Consulting Physician (Hematology) Angelia Mould, MD as Consulting Physician (Vascular Surgery) Koleen Distance, MD as Referring Physician (Gastroenterology)  Date of Service:  09/28/2020  CHIEF COMPLAINT: f/u of anemia   CURRENT THERAPY:  IVFeraheme as needed (if ferritin <100). Changed to IV Venofer on 01/02/19. Due to recent GI bleeding, we increased Venofer to every 2 weeks on 05/22/20.  INTERVAL HISTORY:  Bethany [name] is here for a follow up of anemia. She was last seen by me 08/27/20. She presents to the clinic with her sister. 3-4 days ago she notes an episode of N&V that lasted a little over 24 hours. During this time her BG was in the 300 range. She took increased units of Humalog (5) which helped. She was not able to contact her PCP. She is considering changing her PCP office. She notes her appetite is lower and eating once a day. She is losing weight. She has not discussed her ongoing N&V with her GI. She plans to see her soon. She notes weight loss without trying. She denies change in diet.  She notes she has stopped drinking, but she still smokes.     REVIEW OF SYSTEMS:   Constitutional: Denies fevers, chills (+) weight loss Eyes: Denies blurriness of vision Ears, nose, mouth, throat, and face: Denies mucositis or sore throat Respiratory: Denies cough, dyspnea or wheezes Cardiovascular: Denies palpitation, chest discomfort or lower extremity swelling Gastrointestinal:  Denies heartburn or change in bowel habits (+) Intermittent N&V Skin: Denies abnormal skin rashes Lymphatics: Denies new lymphadenopathy or easy bruising Neurological:Denies numbness, tingling or new weaknesses Behavioral/Psych: Mood is stable, no new  changes  All other systems were reviewed with the patient and are negative.  MEDICAL HISTORY:  Past Medical History:  Diagnosis Date  . Acute renal failure ([place])   . Anemia   . Arthritis   . COPD (chronic obstructive pulmonary disease) ([place])   . Diabetes mellitus without complication ([place])    type II   . GERD (gastroesophageal reflux disease)   . Hypercalcemia   . Hyperlipidemia   . Hypertension   . Pneumonia   . Renal disorder   . Single kidney   . Stroke [facility])    July 2021  . Thrombosis   . Tobacco abuse     SURGICAL HISTORY: Past Surgical History:  Procedure Laterality Date  . ABDOMINAL HYSTERECTOMY    . blood clots removed from descending aorta     . CHOLECYSTECTOMY    . COLONOSCOPY    . COLONOSCOPY WITH PROPOFOL N/A 04/17/2017   Procedure: COLONOSCOPY WITH PROPOFOL;  Surgeon: Wilford Corner, MD;  Location: [facility];  Service: Endoscopy;  Laterality: N/A;  . ESOPHAGOGASTRODUODENOSCOPY (EGD) WITH PROPOFOL N/A 04/17/2017   Procedure: ESOPHAGOGASTRODUODENOSCOPY (EGD) WITH PROPOFOL;  Surgeon: Wilford Corner, MD;  Location: [facility];  Service: Endoscopy;  Laterality: N/A;  . IR 3D INDEPENDENT WKST  05/04/2020  . IR ANGIO INTRA EXTRACRAN SEL COM CAROTID INNOMINATE BILAT MOD SED  02/06/2020  . IR ANGIO INTRA EXTRACRAN SEL INTERNAL CAROTID UNI L MOD SED  05/04/2020  . IR ANGIO VERTEBRAL SEL VERTEBRAL BILAT MOD SED  02/06/2020  . IR ANGIOGRAM FOLLOW UP STUDY  05/04/2020  . IR IMAGING GUIDED PORT INSERTION  05/01/2019  .  IR NEURO EACH ADD'L AFTER BASIC UNI LEFT (MS)  05/04/2020  . IR RADIOLOGIST EVAL & MGMT  05/28/2020  . IR TRANSCATH/EMBOLIZ  05/04/2020  . IR US GUIDE VASC ACCESS RIGHT  02/06/2020  . IR US GUIDE VASC ACCESS RIGHT  05/04/2020  . NEPHRECTOMY RECIPIENT    . RADIOLOGY WITH ANESTHESIA N/A 05/04/2020   Procedure: Sheppard Plumber;  Surgeon: Luanne Bras, MD;  Location: [facility];  Service: Radiology;  Laterality: N/A;    I have reviewed the social  history and family history with the patient and they are unchanged from previous note.  ALLERGIES:  is allergic to contrast media [iodinated diagnostic agents] and other.  MEDICATIONS:  Current Outpatient Medications  Medication Sig Dispense Refill  . prochlorperazine (COMPAZINE) 5 MG tablet Take 1 tablet (5 mg total) by mouth every 6 (six) hours as needed for nausea or vomiting. 30 tablet 0  . allopurinol (ZYLOPRIM) 100 MG tablet Take 100 mg by mouth at bedtime.    Marland Kitchen amLODipine (NORVASC) 10 MG tablet Take 10 mg by mouth daily.    . APPLE CIDER VINEGAR PO Take 1 tablet by mouth daily.    Marland Kitchen aspirin EC 81 MG EC tablet Take 1 tablet (81 mg total) by mouth daily. Swallow whole. 30 tablet 11  . atorvastatin (LIPITOR) 80 MG tablet Take 1 tablet (80 mg total) by mouth daily. 30 tablet 11  . baclofen (LIORESAL) 10 MG tablet Take 10 mg by mouth 3 (three) times daily as needed for muscle spasms.    . buprenorphine (BUTRANS) 20 MCG/HR PTWK Place 1 patch onto the skin every Monday.     . clopidogrel (PLAVIX) 75 MG tablet Take 75 mg by mouth daily.    . Dulaglutide (TRULICITY) 1.5 FE/0.7HQ SOPN Inject 1.5 mg into the skin every Monday.     . ezetimibe (ZETIA) 10 MG tablet Take 10 mg by mouth daily.    . feeding supplement, ENSURE ENLIVE, (ENSURE ENLIVE) LIQD Take 237 mLs by mouth 2 (two) times daily between meals. 237 mL 12  . furosemide (LASIX) 40 MG tablet Take 1 tablet (40 mg total) by mouth daily as needed. (Patient taking differently: Take 40 mg by mouth daily as needed for fluid.) 45 tablet 3  . gabapentin (NEURONTIN) 100 MG capsule Take 2 capsules (200 mg total) by mouth 4 (four) times daily. 240 capsule 3  . insulin glargine, 1 Unit Dial, (TOUJEO SOLOSTAR) 300 UNIT/ML Solostar Pen Inject 20 Units into the skin at bedtime.     . Ipratropium-Albuterol (COMBIVENT) 20-100 MCG/ACT AERS respimat Inhale 1 puff into the lungs every 6 (six) hours as needed for wheezing or shortness of breath. 1 Inhaler 0  .  levothyroxine (SYNTHROID) 75 MCG tablet Take 75 mcg by mouth daily.    Marland Kitchen lidocaine-prilocaine (EMLA) cream Apply 1 application topically as needed. 30 g 1  . losartan (COZAAR) 25 MG tablet Take 1 tablet (25 mg total) by mouth daily. 30 tablet 11  . metoprolol (LOPRESSOR) 50 MG tablet Take 50 mg by mouth 2 (two) times daily.     . Multiple Vitamin (MULTIVITAMIN WITH MINERALS) TABS tablet Take 1 tablet by mouth daily.    Marland Kitchen NARCAN 4 MG/0.1ML LIQD nasal spray kit Place 1 spray into the nose as needed for opioid reversal.    . oxyCODONE-acetaminophen (PERCOCET) 10-325 MG tablet Take 1 tablet by mouth every 6 (six) hours as needed (breakthrough pain).     . pantoprazole (PROTONIX) 40 MG tablet Take 40 mg  by mouth every morning.    . pregabalin (LYRICA) 25 MG capsule Take 25 mg by mouth 2 (two) times daily.     . sennosides-docusate sodium (SENOKOT-S) 8.6-50 MG tablet Take 1 tablet by mouth at bedtime.    . Vitamin D, Ergocalciferol, (DRISDOL) 1.25 MG (50000 UNIT) CAPS capsule Take 50,000 Units by mouth every Monday.     Marland Kitchen XTAMPZA ER 27 MG C12A Take 27 mg by mouth 2 (two) times daily.      No current facility-administered medications for this visit.    PHYSICAL EXAMINATION: ECOG PERFORMANCE STATUS: 1 - Symptomatic but completely ambulatory  Vitals:   09/28/20 1025  BP: (!) 131/51  Pulse: 60  Resp: 18  Temp: 97.9 F (36.6 C)  SpO2: 100%   Filed Weights   09/28/20 1025  Weight: 173 lb 14.4 oz (78.9 kg)    Due to COVID19 we will limit examination to appearance. Patient had no complaints.  GENERAL:alert, no distress and comfortable SKIN: skin color normal, no rashes or significant lesions EYES: normal, Conjunctiva are pink and non-injected, sclera clear  NEURO: alert & oriented x 3 with fluent speech   LABORATORY DATA:  I have reviewed the data as listed CBC Latest Ref Rng & Units 09/25/2020 09/03/2020 08/14/2020  WBC 4.0 - 10.5 K/uL 7.9 7.6 6.3  Hemoglobin 12.0 - 15.0 g/dL 10.0(L)  10.4(L) 10.1(L)  Hematocrit 36.0 - 46.0 % 33.2(L) 34.8(L) 33.4(L)  Platelets 150 - 400 K/uL 253 252 177     CMP Latest Ref Rng & Units 09/03/2020 08/14/2020 05/29/2020  Glucose 70 - 99 mg/dL 287(H) 185(H) 247(H)  BUN 8 - 23 mg/dL 18 32(H) 27(H)  Creatinine 0.44 - 1.00 mg/dL 1.82(H) 1.55(H) 1.43(H)  Sodium 135 - 145 mmol/L 136 139 138  Potassium 3.5 - 5.1 mmol/L 4.3 4.1 4.8  Chloride 98 - 111 mmol/L 100 106 109  CO2 22 - 32 mmol/L _0 Calcium 8.9 - 10.3 mg/dL 10.1 9.6 9.5  Total Protein 6.5 - 8.1 g/dL 6.8 6.5 5.9(L)  Total Bilirubin 0.3 - 1.2 mg/dL 0.3 0.3 0.2(L)  Alkaline Phos 38 - 126 U/L 270(H) 249(H) 135(H)  AST 15 - 41 U/L 232(HH) 211(HH) 18  ALT 0 - 44 U/L 137(H) 166(H) 15    CT Chest 09/25/20  IMPRESSION: 1. Multiple pulmonary nodules are again noted. The dominant nodule within the left upper lobe is decreased in size in the interval. Additionally, there is an anterior left upper lobe lung nodule which appears less solid and decreased in size. The non solid nodule in the left apex is also decreased in size in the interval. Similar appearance of subcentimeter, solid nodules within the right lower lobe and left lower lobe. Given absence of known malignancy findings suggest benign inflammatory or infectious process. Suggest continued interval surveillance to ensure resolution. 2. Unchanged appearance of asymmetric increased soft tissue within the right breast. Correlation with recent mammographic workup is advised. 3. Mild coronary artery calcifications.  Aortic Atherosclerosis (ICD10-I70.0) and Emphysema (ICD10-J43.9).   RADIOGRAPHIC STUDIES: I have personally reviewed the radiological images as listed and agreed with the findings in the report. No results found.   ASSESSMENT & PLAN:  Bethany [name] is a 70 y.o. female with   1. Multiple lung nodules  -For Lung cancer screening, her CT Chest from 07/12/19 shows Stable 7 mm nodule in left lower lobe. Also shows New  4 mm nodule is noted in left upper lobe and 12 mm sub solid density  is noted in left lung apex. -Her CT Chest from 07/13/20 showedStable small pulmonary nodule in the left lower lobe. Scan also showsNew bilateral pulmonary nodules including a dominant 1.3 cm pulmonary nodule in the left upper lobe. Findings are concerning forprimary lung cancer ormetastatic disease. -HerPET from 3/15/22showsScattered pulmonary nodules, the dominant nodule inleft lungshowing mild hypermetabolic features. Findings may reflect metastatic disease. RIGHT breast mass with low level FDG uptake is nonspecific, also raising the question of neoplasm. -Pt notes h/o benign right breast fibroadenoma, biopsied in 2014, stable on 05/2017 mammogram. She also notes having liver lesion in [place] and Dr Hilarie Fredrickson is monitoring. Appeared benign on 01/2019 US abdomen. These were not seen on PET.  -Biopsy of left lung was attempted by Dr Pascal Lux butheldgiven difficult location.  -She underwent right breast biopsy on 08/25/20 showing ductal papilloma with usual ductal hyperplasia -Her CT Chest from 09/25/20 showed multiple lung nodules are stable or smaller, especially the dominant nodule within the left upper lobe is decreased in size in the interval. This is likely benign. I personally reviewed with patient today.  -Will repeat CT Chest in 4 months. I recommend she proceed Breast MRI in 10/2020.    2. 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 She has required blood transfusions in the past in 2013 and in 2018 and frequent hospital admission for anemia. -She has intermittent epistaxis, but no skin or mucosa telangiectasia, no significant family history of bleeding disorder, she does not meet the diagnosis criteria for hereditary hemorrhagic telangiectasia. -Her lastcolonoscopy/endoscopy was  04/2017. -She is not on oral iron, due to the lack of benefit. -Currently being treated withIV Iron to prevent significant anemiawith goal of Ferritin 100-200 range. She was switched to IV Venofer on 8/19/20due to her insurance coverage.Increased to 489mwith each infusionfrom 02/14/20.Due to recent GI bleeding, we increased Venofer to every1-2 weeks on 05/22/20. Given severe anemia, Plavix has been held and continues Aspirin.  -She has not required IV iron since 07/17/20. Labs reviewed, her Ferritin and Hg has been slowly decreasing lately. I discussed she will likely need IV iron again soon.  -Continue Labs every 3 weeks. Will set up IV Venofer if previous ferritin>300 -F/u in 6 weeks.   3.SmokingHistory, Smoking Cessation  -She has been smokingintermittentlyfor 40 years.Then increased smoking daily after her mother passed in 2021. -She has had lung nodules on Lung cancer screening CT. PET scan on 07/28/20 showed scattered pulmonary nodules with mild hypermetabolic features. -She continues to smoke. I discussed complete smoking cessation. She is known to have COPD/Emphysema.   4. Chronic GI Bleeding, intermittent epistaxis, constipation, N&V -Has intestinal AVM history  -Hercolonoscopy/EGD by Dr. SMichail Sermonon 04/17/17 showed gastritis, diverticulosis and internal hemorrhoids, all benign with no obvious signs of bleeding at that time.She had repeat exams with Dr SRodena Pietyend of 2019. I will obtain report. -She does not meet the diagnosis criteria for hereditary hemorrhagic telangiectasia (HHT) -She has been having multiple episodes of intermittent N&V. I recommend she consult her Gi about this soon. I will call in Zofran today for management (09/28/20). She is agreeable.   5. H/o of arterial thrombosis,Ischemic stroke 11/2019, Left Brain Aneurysm (421mand 15m40m-S/p unilateral nephrectomy due to damage fromarterialthrombosis -Managed by vascular physician, Dr. DixJoyce Grossurologist  Dr SetLeonie Manrevious work-up for lupus anticoagulantand antiphospholipid syndromewerenegative, normal protein S and C level. -She notes  she still has mild speech deficit (difficulty finding her words).  -On 02/06/20 Dr Allegra Grana found a second brain aneurysm.She underwent embolization surgery on 05/04/20 -After 04/2020 procedure she has restarted baby aspirin and Plavix until her next neuro f/u.Given increased GI bleeding and worsened anemia, we have held Plavix since 05/22/20. She is fine to continue baby aspirin. -She is fine to proceed with Brain MRI in 10/220, given she will be off IV iron for 3 months.   6. COPD, HTN, CKD stage III,DM with Peripheral Neuropathy,Chronic Back Pain  -She is currently on 637m Gabapentin 4 times a day. -[facility].    PLAN: -I called in Zofran today  -CT chest reviewed, lung nodules decreased in size or stable.  -Continue to hold Plavix.  -Lab and Breast MRI in 10/2020. OK to do brain MRI for her anurysym f/u -Labs every 3 weeks, Will give IV Venofer if previous ferritin<300 -F/u in 6 weeks    No problem-specific Assessment & Plan notes found for this encounter.   Orders Placed This Encounter  Procedures  . MR BREAST BILATERAL W WO CONTRAST INC CAD    Standing Status:   Future    Standing Expiration Date:   09/28/2021    Order Specific Question:   If indicated for the ordered procedure, I authorize the administration of contrast media per Radiology protocol    Answer:   Yes    Order Specific Question:   What is the patient's sedation requirement?    Answer:   No Sedation    Order Specific Question:   Does the patient have a pacemaker or implanted devices?    Answer:   No    Order Specific Question:   Radiology Contrast Protocol - do NOT remove file path    Answer:   [email or web address]    Order Specific Question:    Preferred imaging location?    Answer:   [facility] (table limit-550lbs)   All questions were answered. The patient knows to call the clinic with any problems, questions or concerns. No barriers to learning was detected. The total time spent in the appointment was 30 minutes.     YTruitt Merle MD 09/28/2020   I, AJoslyn Devon am acting as scribe for YTruitt Merle MD.   I have reviewed the above documentation for accuracy and completeness, and I agree with the above.

## 2020-09-24 DIAGNOSIS — Z720 Tobacco use: Secondary | ICD-10-CM | POA: Diagnosis not present

## 2020-09-24 DIAGNOSIS — Z634 Disappearance and death of family member: Secondary | ICD-10-CM | POA: Diagnosis not present

## 2020-09-24 DIAGNOSIS — F17210 Nicotine dependence, cigarettes, uncomplicated: Secondary | ICD-10-CM | POA: Diagnosis not present

## 2020-09-24 DIAGNOSIS — F32A Depression, unspecified: Secondary | ICD-10-CM | POA: Diagnosis not present

## 2020-09-25 ENCOUNTER — Inpatient Hospital Stay: Payer: Medicare Other | Attending: Hematology

## 2020-09-25 ENCOUNTER — Inpatient Hospital Stay: Payer: Medicare Other

## 2020-09-25 ENCOUNTER — Other Ambulatory Visit: Payer: Self-pay

## 2020-09-25 ENCOUNTER — Ambulatory Visit (HOSPITAL_COMMUNITY)
Admission: RE | Admit: 2020-09-25 | Discharge: 2020-09-25 | Disposition: A | Discharge status: Home / Self Care | Payer: Medicare Other | Source: Ambulatory Visit | Attending: Hematology | Admitting: Hematology

## 2020-09-25 DIAGNOSIS — R911 Solitary pulmonary nodule: Secondary | ICD-10-CM | POA: Diagnosis not present

## 2020-09-25 DIAGNOSIS — Q2733 Arteriovenous malformation of digestive system vessel: Secondary | ICD-10-CM | POA: Insufficient documentation

## 2020-09-25 DIAGNOSIS — J432 Centrilobular emphysema: Secondary | ICD-10-CM | POA: Diagnosis not present

## 2020-09-25 DIAGNOSIS — K5520 Angiodysplasia of colon without hemorrhage: Secondary | ICD-10-CM

## 2020-09-25 DIAGNOSIS — D500 Iron deficiency anemia secondary to blood loss (chronic): Secondary | ICD-10-CM

## 2020-09-25 DIAGNOSIS — I700 Atherosclerosis of aorta: Secondary | ICD-10-CM | POA: Diagnosis not present

## 2020-09-25 DIAGNOSIS — R918 Other nonspecific abnormal finding of lung field: Secondary | ICD-10-CM | POA: Diagnosis not present

## 2020-09-25 DIAGNOSIS — I2510 Atherosclerotic heart disease of native coronary artery without angina pectoris: Secondary | ICD-10-CM | POA: Diagnosis not present

## 2020-09-25 LAB — CBC WITH DIFFERENTIAL (CANCER CENTER ONLY)
Abs Immature Granulocytes: 0.03 10*3/uL (ref 0.00–0.07)
Basophils Absolute: 0 10*3/uL (ref 0.0–0.1)
Basophils Relative: 0 %
Eosinophils Absolute: 0.1 10*3/uL (ref 0.0–0.5)
Eosinophils Relative: 2 %
HCT: 33.2 % — ABNORMAL LOW (ref 36.0–46.0)
Hemoglobin: 10 g/dL — ABNORMAL LOW (ref 12.0–15.0)
Immature Granulocytes: 0 %
Lymphocytes Relative: 15 %
Lymphs Abs: 1.2 10*3/uL (ref 0.7–4.0)
MCH: 23.7 pg — ABNORMAL LOW (ref 26.0–34.0)
MCHC: 30.1 g/dL (ref 30.0–36.0)
MCV: 78.7 fL — ABNORMAL LOW (ref 80.0–100.0)
Monocytes Absolute: 0.5 10*3/uL (ref 0.1–1.0)
Monocytes Relative: 7 %
Neutro Abs: 5.9 10*3/uL (ref 1.7–7.7)
Neutrophils Relative %: 76 %
Platelet Count: 253 10*3/uL (ref 150–400)
RBC: 4.22 MIL/uL (ref 3.87–5.11)
RDW: 14.8 % (ref 11.5–15.5)
WBC Count: 7.9 10*3/uL (ref 4.0–10.5)
nRBC: 0 % (ref 0.0–0.2)

## 2020-09-25 LAB — IRON AND TIBC
Iron: 51 ug/dL (ref 41–142)
Saturation Ratios: 14 % — ABNORMAL LOW (ref 21–57)
TIBC: 358 ug/dL (ref 236–444)
UIBC: 307 ug/dL (ref 120–384)

## 2020-09-25 LAB — FERRITIN: Ferritin: 112 ng/mL (ref 11–307)

## 2020-09-25 MED ORDER — SODIUM CHLORIDE 0.9% FLUSH
10.0000 mL | Freq: Once | INTRAVENOUS | Status: AC
Start: 2020-09-25 — End: 2020-09-25
  Administered 2020-09-25: 10 mL
  Filled 2020-09-25: qty 10

## 2020-09-25 MED ORDER — HEPARIN SOD (PORK) LOCK FLUSH 100 UNIT/ML IV SOLN
500.0000 [IU] | Freq: Once | INTRAVENOUS | Status: AC
  Administered 2020-09-25: 500 [IU]
  Filled 2020-09-25: qty 5

## 2020-09-28 ENCOUNTER — Other Ambulatory Visit: Payer: Self-pay

## 2020-09-28 ENCOUNTER — Telehealth (HOSPITAL_COMMUNITY): Payer: Self-pay

## 2020-09-28 ENCOUNTER — Telehealth: Payer: Self-pay | Admitting: Hematology

## 2020-09-28 ENCOUNTER — Inpatient Hospital Stay (HOSPITAL_BASED_OUTPATIENT_CLINIC_OR_DEPARTMENT_OTHER): Payer: Medicare Other | Admitting: Hematology

## 2020-09-28 VITALS — BP 131/51 | HR 60 | Temp 97.9°F | Resp 18 | Wt 173.9 lb

## 2020-09-28 DIAGNOSIS — Z95828 Presence of other vascular implants and grafts: Secondary | ICD-10-CM

## 2020-09-28 DIAGNOSIS — Q2733 Arteriovenous malformation of digestive system vessel: Secondary | ICD-10-CM | POA: Diagnosis not present

## 2020-09-28 DIAGNOSIS — N649 Disorder of breast, unspecified: Secondary | ICD-10-CM

## 2020-09-28 DIAGNOSIS — D500 Iron deficiency anemia secondary to blood loss (chronic): Secondary | ICD-10-CM | POA: Diagnosis not present

## 2020-09-28 MED ORDER — LIDOCAINE-PRILOCAINE 2.5-2.5 % EX CREA: 1.0000 "application " | TOPICAL_CREAM | CUTANEOUS | 1 refills | Status: DC | PRN

## 2020-09-28 MED ORDER — PROCHLORPERAZINE MALEATE 5 MG PO TABS: 5.0000 mg | ORAL_TABLET | Freq: Four times a day (QID) | ORAL | 0 refills | Status: DC | PRN

## 2020-09-28 NOTE — Telephone Encounter (Signed)
Left a vm for Dr. Ernestina Penna office. Wanting to know if pt can hold off on her iron infusions just long enough so that she can have her mri done. I called mri and they told me that when a pt gets an infusion, they have to be scheduled 3 months out. Her last infusion was on 08/14/20. Will await a return call. AW

## 2020-09-28 NOTE — Telephone Encounter (Signed)
Scheduled follow-up appointments per 5/16 los. Patient is aware. 

## 2020-09-28 NOTE — Telephone Encounter (Signed)
Called pt for an update on her infustions (to schedule MRI), no answer, left vm. AW

## 2020-09-29 ENCOUNTER — Encounter: Payer: Self-pay | Admitting: Hematology

## 2020-10-05 DIAGNOSIS — I10 Essential (primary) hypertension: Secondary | ICD-10-CM | POA: Diagnosis not present

## 2020-10-05 DIAGNOSIS — D501 Sideropenic dysphagia: Secondary | ICD-10-CM | POA: Diagnosis not present

## 2020-10-05 DIAGNOSIS — E7800 Pure hypercholesterolemia, unspecified: Secondary | ICD-10-CM | POA: Diagnosis not present

## 2020-10-05 DIAGNOSIS — J449 Chronic obstructive pulmonary disease, unspecified: Secondary | ICD-10-CM | POA: Diagnosis not present

## 2020-10-05 DIAGNOSIS — K219 Gastro-esophageal reflux disease without esophagitis: Secondary | ICD-10-CM | POA: Diagnosis not present

## 2020-10-05 DIAGNOSIS — E1140 Type 2 diabetes mellitus with diabetic neuropathy, unspecified: Secondary | ICD-10-CM | POA: Diagnosis not present

## 2020-10-05 DIAGNOSIS — J441 Chronic obstructive pulmonary disease with (acute) exacerbation: Secondary | ICD-10-CM | POA: Diagnosis not present

## 2020-10-05 DIAGNOSIS — G459 Transient cerebral ischemic attack, unspecified: Secondary | ICD-10-CM | POA: Diagnosis not present

## 2020-10-05 DIAGNOSIS — I509 Heart failure, unspecified: Secondary | ICD-10-CM | POA: Diagnosis not present

## 2020-10-05 DIAGNOSIS — E039 Hypothyroidism, unspecified: Secondary | ICD-10-CM | POA: Diagnosis not present

## 2020-10-05 DIAGNOSIS — N1831 Chronic kidney disease, stage 3a: Secondary | ICD-10-CM | POA: Diagnosis not present

## 2020-10-05 DIAGNOSIS — G8929 Other chronic pain: Secondary | ICD-10-CM | POA: Diagnosis not present

## 2020-10-11 ENCOUNTER — Other Ambulatory Visit: Payer: Self-pay

## 2020-10-11 ENCOUNTER — Ambulatory Visit
Admission: RE | Admit: 2020-10-11 | Discharge: 2020-10-11 | Disposition: A | Discharge status: Home / Self Care | Payer: Medicare Other | Source: Ambulatory Visit | Attending: Hematology | Admitting: Hematology

## 2020-10-11 DIAGNOSIS — N001 Acute nephritic syndrome with focal and segmental glomerular lesions: Secondary | ICD-10-CM | POA: Diagnosis not present

## 2020-10-11 DIAGNOSIS — N6001 Solitary cyst of right breast: Secondary | ICD-10-CM | POA: Diagnosis not present

## 2020-10-11 DIAGNOSIS — N649 Disorder of breast, unspecified: Secondary | ICD-10-CM

## 2020-10-11 DIAGNOSIS — N6310 Unspecified lump in the right breast, unspecified quadrant: Secondary | ICD-10-CM | POA: Diagnosis not present

## 2020-10-11 MED ORDER — GADOBUTROL 1 MMOL/ML IV SOLN
8.0000 mL | Freq: Once | INTRAVENOUS | Status: AC | PRN
  Administered 2020-10-11: 8 mL via INTRAVENOUS

## 2020-10-14 DIAGNOSIS — Q2730 Arteriovenous malformation, site unspecified: Secondary | ICD-10-CM | POA: Diagnosis not present

## 2020-10-14 DIAGNOSIS — R7401 Elevation of levels of liver transaminase levels: Secondary | ICD-10-CM | POA: Diagnosis not present

## 2020-10-15 ENCOUNTER — Other Ambulatory Visit: Payer: Self-pay

## 2020-10-15 ENCOUNTER — Ambulatory Visit (HOSPITAL_COMMUNITY)
Admission: RE | Admit: 2020-10-15 | Discharge: 2020-10-15 | Disposition: A | Discharge status: Home / Self Care | Payer: Medicare Other | Source: Ambulatory Visit | Attending: Interventional Radiology | Admitting: Interventional Radiology

## 2020-10-15 DIAGNOSIS — M79605 Pain in left leg: Secondary | ICD-10-CM | POA: Diagnosis not present

## 2020-10-15 DIAGNOSIS — I671 Cerebral aneurysm, nonruptured: Secondary | ICD-10-CM | POA: Diagnosis not present

## 2020-10-15 DIAGNOSIS — M5137 Other intervertebral disc degeneration, lumbosacral region: Secondary | ICD-10-CM | POA: Diagnosis not present

## 2020-10-15 DIAGNOSIS — M79604 Pain in right leg: Secondary | ICD-10-CM | POA: Diagnosis not present

## 2020-10-15 DIAGNOSIS — Z79899 Other long term (current) drug therapy: Secondary | ICD-10-CM | POA: Diagnosis not present

## 2020-10-15 DIAGNOSIS — F17210 Nicotine dependence, cigarettes, uncomplicated: Secondary | ICD-10-CM | POA: Diagnosis not present

## 2020-10-15 DIAGNOSIS — G894 Chronic pain syndrome: Secondary | ICD-10-CM | POA: Diagnosis not present

## 2020-10-15 DIAGNOSIS — Z9181 History of falling: Secondary | ICD-10-CM | POA: Diagnosis not present

## 2020-10-15 DIAGNOSIS — Z87891 Personal history of nicotine dependence: Secondary | ICD-10-CM | POA: Diagnosis not present

## 2020-10-19 ENCOUNTER — Telehealth (HOSPITAL_COMMUNITY): Payer: Self-pay

## 2020-10-19 ENCOUNTER — Other Ambulatory Visit (HOSPITAL_COMMUNITY): Payer: Self-pay | Admitting: Interventional Radiology

## 2020-10-19 DIAGNOSIS — Z79899 Other long term (current) drug therapy: Secondary | ICD-10-CM | POA: Diagnosis not present

## 2020-10-19 DIAGNOSIS — I671 Cerebral aneurysm, nonruptured: Secondary | ICD-10-CM

## 2020-10-19 NOTE — Telephone Encounter (Signed)
Pt agreed to f/u in 6 months with mri/mra. AW - will check with Dr. Burr Medico regarding Iron infusions for upcoming MRI.

## 2020-10-21 ENCOUNTER — Other Ambulatory Visit: Payer: Self-pay

## 2020-10-21 ENCOUNTER — Telehealth: Payer: Self-pay

## 2020-10-21 ENCOUNTER — Inpatient Hospital Stay: Payer: Medicare Other | Attending: Hematology

## 2020-10-21 DIAGNOSIS — Z87891 Personal history of nicotine dependence: Secondary | ICD-10-CM | POA: Diagnosis not present

## 2020-10-21 DIAGNOSIS — Z905 Acquired absence of kidney: Secondary | ICD-10-CM | POA: Diagnosis not present

## 2020-10-21 DIAGNOSIS — K5520 Angiodysplasia of colon without hemorrhage: Secondary | ICD-10-CM

## 2020-10-21 DIAGNOSIS — Z7982 Long term (current) use of aspirin: Secondary | ICD-10-CM | POA: Diagnosis not present

## 2020-10-21 DIAGNOSIS — Q2733 Arteriovenous malformation of digestive system vessel: Secondary | ICD-10-CM | POA: Diagnosis not present

## 2020-10-21 DIAGNOSIS — Z79899 Other long term (current) drug therapy: Secondary | ICD-10-CM | POA: Diagnosis not present

## 2020-10-21 DIAGNOSIS — D500 Iron deficiency anemia secondary to blood loss (chronic): Secondary | ICD-10-CM | POA: Insufficient documentation

## 2020-10-21 DIAGNOSIS — E1122 Type 2 diabetes mellitus with diabetic chronic kidney disease: Secondary | ICD-10-CM | POA: Insufficient documentation

## 2020-10-21 DIAGNOSIS — N183 Chronic kidney disease, stage 3 unspecified: Secondary | ICD-10-CM | POA: Diagnosis not present

## 2020-10-21 DIAGNOSIS — Z8673 Personal history of transient ischemic attack (TIA), and cerebral infarction without residual deficits: Secondary | ICD-10-CM | POA: Insufficient documentation

## 2020-10-21 DIAGNOSIS — I129 Hypertensive chronic kidney disease with stage 1 through stage 4 chronic kidney disease, or unspecified chronic kidney disease: Secondary | ICD-10-CM | POA: Insufficient documentation

## 2020-10-21 DIAGNOSIS — N6310 Unspecified lump in the right breast, unspecified quadrant: Secondary | ICD-10-CM

## 2020-10-21 DIAGNOSIS — Z7902 Long term (current) use of antithrombotics/antiplatelets: Secondary | ICD-10-CM | POA: Diagnosis not present

## 2020-10-21 DIAGNOSIS — Z794 Long term (current) use of insulin: Secondary | ICD-10-CM | POA: Diagnosis not present

## 2020-10-21 LAB — CMP (CANCER CENTER ONLY)
ALT: 18 U/L (ref 0–44)
AST: 16 U/L (ref 15–41)
Albumin: 3.6 g/dL (ref 3.5–5.0)
Alkaline Phosphatase: 139 U/L — ABNORMAL HIGH (ref 38–126)
Anion gap: 10 (ref 5–15)
BUN: 27 mg/dL — ABNORMAL HIGH (ref 8–23)
CO2: 25 mmol/L (ref 22–32)
Calcium: 10 mg/dL (ref 8.9–10.3)
Chloride: 103 mmol/L (ref 98–111)
Creatinine: 2.48 mg/dL — ABNORMAL HIGH (ref 0.44–1.00)
GFR, Estimated: 20 mL/min — ABNORMAL LOW (ref >60)
Glucose, Bld: 239 mg/dL — ABNORMAL HIGH (ref 70–99)
Potassium: 4.8 mmol/L (ref 3.5–5.1)
Sodium: 138 mmol/L (ref 135–145)
Total Bilirubin: 0.3 mg/dL (ref 0.3–1.2)
Total Protein: 6.6 g/dL (ref 6.5–8.1)

## 2020-10-21 LAB — CBC WITH DIFFERENTIAL (CANCER CENTER ONLY)
Abs Immature Granulocytes: 0.02 10*3/uL (ref 0.00–0.07)
Basophils Absolute: 0 10*3/uL (ref 0.0–0.1)
Basophils Relative: 0 %
Eosinophils Absolute: 0.2 10*3/uL (ref 0.0–0.5)
Eosinophils Relative: 3 %
HCT: 31.8 % — ABNORMAL LOW (ref 36.0–46.0)
Hemoglobin: 9.3 g/dL — ABNORMAL LOW (ref 12.0–15.0)
Immature Granulocytes: 0 %
Lymphocytes Relative: 21 %
Lymphs Abs: 1.4 10*3/uL (ref 0.7–4.0)
MCH: 22.8 pg — ABNORMAL LOW (ref 26.0–34.0)
MCHC: 29.2 g/dL — ABNORMAL LOW (ref 30.0–36.0)
MCV: 77.9 fL — ABNORMAL LOW (ref 80.0–100.0)
Monocytes Absolute: 0.5 10*3/uL (ref 0.1–1.0)
Monocytes Relative: 8 %
Neutro Abs: 4.5 10*3/uL (ref 1.7–7.7)
Neutrophils Relative %: 68 %
Platelet Count: 215 10*3/uL (ref 150–400)
RBC: 4.08 MIL/uL (ref 3.87–5.11)
RDW: 15.4 % (ref 11.5–15.5)
WBC Count: 6.7 10*3/uL (ref 4.0–10.5)
nRBC: 0.3 % — ABNORMAL HIGH (ref 0.0–0.2)

## 2020-10-21 LAB — IRON AND TIBC
Iron: 37 ug/dL — ABNORMAL LOW (ref 41–142)
Saturation Ratios: 10 % — ABNORMAL LOW (ref 21–57)
TIBC: 376 ug/dL (ref 236–444)
UIBC: 339 ug/dL (ref 120–384)

## 2020-10-21 LAB — FERRITIN: Ferritin: 53 ng/mL (ref 11–307)

## 2020-10-21 MED ORDER — SODIUM CHLORIDE 0.9% FLUSH
10.0000 mL | Freq: Once | INTRAVENOUS | Status: AC
  Administered 2020-10-21: 10 mL
  Filled 2020-10-21: qty 10

## 2020-10-21 MED ORDER — HEPARIN SOD (PORK) LOCK FLUSH 100 UNIT/ML IV SOLN
500.0000 [IU] | Freq: Once | INTRAVENOUS | Status: AC
Start: 2020-10-21 — End: 2020-10-21
  Administered 2020-10-21: 500 [IU]
  Filled 2020-10-21: qty 5

## 2020-10-21 NOTE — Telephone Encounter (Signed)
-----   Message from Truitt Merle, MD sent at 10/21/2020  7:05 AM EDT ----- My nurse, Could you call her and let her know the MRI results? If she agrees, please order MRI guided right breast biopsy and get it scheduled, thanks   Truitt Merle  10/21/2020

## 2020-10-21 NOTE — Telephone Encounter (Signed)
I spoke with Bethany [name] and relayed Dr Ernestina Penna comments and recommendations.  She is agreeable to right breast biopsy.  Order has been placed.

## 2020-10-22 ENCOUNTER — Other Ambulatory Visit: Payer: Self-pay | Admitting: Hematology

## 2020-10-22 DIAGNOSIS — R9389 Abnormal findings on diagnostic imaging of other specified body structures: Secondary | ICD-10-CM

## 2020-10-26 ENCOUNTER — Other Ambulatory Visit: Payer: Self-pay

## 2020-10-26 ENCOUNTER — Ambulatory Visit
Admission: RE | Admit: 2020-10-26 | Discharge: 2020-10-26 | Disposition: A | Discharge status: Home / Self Care | Payer: Medicare Other | Source: Ambulatory Visit | Attending: Hematology | Admitting: Hematology

## 2020-10-26 ENCOUNTER — Other Ambulatory Visit (HOSPITAL_COMMUNITY): Payer: Self-pay | Admitting: Diagnostic Radiology

## 2020-10-26 DIAGNOSIS — R928 Other abnormal and inconclusive findings on diagnostic imaging of breast: Secondary | ICD-10-CM | POA: Diagnosis not present

## 2020-10-26 DIAGNOSIS — R9389 Abnormal findings on diagnostic imaging of other specified body structures: Secondary | ICD-10-CM

## 2020-10-26 DIAGNOSIS — N6011 Diffuse cystic mastopathy of right breast: Secondary | ICD-10-CM | POA: Diagnosis not present

## 2020-10-26 DIAGNOSIS — N6310 Unspecified lump in the right breast, unspecified quadrant: Secondary | ICD-10-CM

## 2020-10-26 MED ORDER — GADOBUTROL 1 MMOL/ML IV SOLN: 8.0000 mL | Freq: Once | INTRAVENOUS | Status: DC | PRN

## 2020-10-27 ENCOUNTER — Telehealth: Payer: Self-pay | Admitting: Hematology

## 2020-10-27 NOTE — Telephone Encounter (Signed)
Scheduled appointment per 06/14 sch msg. Patient requested times, patient is aware.

## 2020-11-06 DIAGNOSIS — M109 Gout, unspecified: Secondary | ICD-10-CM | POA: Diagnosis not present

## 2020-11-06 DIAGNOSIS — E1140 Type 2 diabetes mellitus with diabetic neuropathy, unspecified: Secondary | ICD-10-CM | POA: Diagnosis not present

## 2020-11-06 DIAGNOSIS — E785 Hyperlipidemia, unspecified: Secondary | ICD-10-CM | POA: Diagnosis not present

## 2020-11-06 DIAGNOSIS — N1832 Chronic kidney disease, stage 3b: Secondary | ICD-10-CM | POA: Diagnosis not present

## 2020-11-06 DIAGNOSIS — E039 Hypothyroidism, unspecified: Secondary | ICD-10-CM | POA: Diagnosis not present

## 2020-11-06 DIAGNOSIS — I700 Atherosclerosis of aorta: Secondary | ICD-10-CM | POA: Diagnosis not present

## 2020-11-06 DIAGNOSIS — E1165 Type 2 diabetes mellitus with hyperglycemia: Secondary | ICD-10-CM | POA: Diagnosis not present

## 2020-11-06 DIAGNOSIS — I130 Hypertensive heart and chronic kidney disease with heart failure and stage 1 through stage 4 chronic kidney disease, or unspecified chronic kidney disease: Secondary | ICD-10-CM | POA: Diagnosis not present

## 2020-11-06 DIAGNOSIS — Z7189 Other specified counseling: Secondary | ICD-10-CM | POA: Diagnosis not present

## 2020-11-06 DIAGNOSIS — D500 Iron deficiency anemia secondary to blood loss (chronic): Secondary | ICD-10-CM | POA: Diagnosis not present

## 2020-11-06 DIAGNOSIS — Z0001 Encounter for general adult medical examination with abnormal findings: Secondary | ICD-10-CM | POA: Diagnosis not present

## 2020-11-09 DIAGNOSIS — J449 Chronic obstructive pulmonary disease, unspecified: Secondary | ICD-10-CM | POA: Diagnosis not present

## 2020-11-09 DIAGNOSIS — E1140 Type 2 diabetes mellitus with diabetic neuropathy, unspecified: Secondary | ICD-10-CM | POA: Diagnosis not present

## 2020-11-09 DIAGNOSIS — I639 Cerebral infarction, unspecified: Secondary | ICD-10-CM | POA: Diagnosis not present

## 2020-11-09 DIAGNOSIS — I10 Essential (primary) hypertension: Secondary | ICD-10-CM | POA: Diagnosis not present

## 2020-11-09 DIAGNOSIS — G8929 Other chronic pain: Secondary | ICD-10-CM | POA: Diagnosis not present

## 2020-11-09 DIAGNOSIS — G459 Transient cerebral ischemic attack, unspecified: Secondary | ICD-10-CM | POA: Diagnosis not present

## 2020-11-09 DIAGNOSIS — K219 Gastro-esophageal reflux disease without esophagitis: Secondary | ICD-10-CM | POA: Diagnosis not present

## 2020-11-09 DIAGNOSIS — D509 Iron deficiency anemia, unspecified: Secondary | ICD-10-CM | POA: Diagnosis not present

## 2020-11-09 DIAGNOSIS — E039 Hypothyroidism, unspecified: Secondary | ICD-10-CM | POA: Diagnosis not present

## 2020-11-09 DIAGNOSIS — E7800 Pure hypercholesterolemia, unspecified: Secondary | ICD-10-CM | POA: Diagnosis not present

## 2020-11-09 DIAGNOSIS — N1831 Chronic kidney disease, stage 3a: Secondary | ICD-10-CM | POA: Diagnosis not present

## 2020-11-09 DIAGNOSIS — I509 Heart failure, unspecified: Secondary | ICD-10-CM | POA: Diagnosis not present

## 2020-11-10 ENCOUNTER — Inpatient Hospital Stay: Payer: Medicare Other

## 2020-11-10 ENCOUNTER — Other Ambulatory Visit: Payer: Self-pay

## 2020-11-10 VITALS — BP 137/62 | HR 66 | Temp 98.3°F | Resp 18 | Wt 167.8 lb

## 2020-11-10 DIAGNOSIS — Z794 Long term (current) use of insulin: Secondary | ICD-10-CM | POA: Diagnosis not present

## 2020-11-10 DIAGNOSIS — D500 Iron deficiency anemia secondary to blood loss (chronic): Secondary | ICD-10-CM | POA: Diagnosis not present

## 2020-11-10 DIAGNOSIS — E1122 Type 2 diabetes mellitus with diabetic chronic kidney disease: Secondary | ICD-10-CM | POA: Diagnosis not present

## 2020-11-10 DIAGNOSIS — K5520 Angiodysplasia of colon without hemorrhage: Secondary | ICD-10-CM

## 2020-11-10 DIAGNOSIS — Q2733 Arteriovenous malformation of digestive system vessel: Secondary | ICD-10-CM | POA: Diagnosis not present

## 2020-11-10 DIAGNOSIS — I129 Hypertensive chronic kidney disease with stage 1 through stage 4 chronic kidney disease, or unspecified chronic kidney disease: Secondary | ICD-10-CM | POA: Diagnosis not present

## 2020-11-10 DIAGNOSIS — Z87891 Personal history of nicotine dependence: Secondary | ICD-10-CM | POA: Diagnosis not present

## 2020-11-10 DIAGNOSIS — Z79899 Other long term (current) drug therapy: Secondary | ICD-10-CM | POA: Diagnosis not present

## 2020-11-10 DIAGNOSIS — Z8673 Personal history of transient ischemic attack (TIA), and cerebral infarction without residual deficits: Secondary | ICD-10-CM | POA: Diagnosis not present

## 2020-11-10 DIAGNOSIS — Z7982 Long term (current) use of aspirin: Secondary | ICD-10-CM | POA: Diagnosis not present

## 2020-11-10 DIAGNOSIS — Z905 Acquired absence of kidney: Secondary | ICD-10-CM | POA: Diagnosis not present

## 2020-11-10 DIAGNOSIS — Z7902 Long term (current) use of antithrombotics/antiplatelets: Secondary | ICD-10-CM | POA: Diagnosis not present

## 2020-11-10 DIAGNOSIS — N183 Chronic kidney disease, stage 3 unspecified: Secondary | ICD-10-CM | POA: Diagnosis not present

## 2020-11-10 MED ORDER — HEPARIN SOD (PORK) LOCK FLUSH 100 UNIT/ML IV SOLN
500.0000 [IU] | Freq: Once | INTRAVENOUS | Status: AC
  Administered 2020-11-10: 500 [IU]
  Filled 2020-11-10: qty 5

## 2020-11-10 MED ORDER — SODIUM CHLORIDE 0.9 % IV SOLN
INTRAVENOUS | Status: DC
  Filled 2020-11-10: qty 250

## 2020-11-10 MED ORDER — SODIUM CHLORIDE 0.9 % IV SOLN
400.0000 mg | Freq: Once | INTRAVENOUS | Status: AC
  Administered 2020-11-10: 400 mg via INTRAVENOUS
  Filled 2020-11-10: qty 20

## 2020-11-10 MED ORDER — SODIUM CHLORIDE 0.9% FLUSH
10.0000 mL | Freq: Once | INTRAVENOUS | Status: AC
  Administered 2020-11-10: 10 mL
  Filled 2020-11-10: qty 10

## 2020-11-10 NOTE — Progress Notes (Signed)
[place]   [phone] Fax:([phone]   Clinic Follow up Note   Patient Care Team: Wenda Low, MD as PCP - General (Internal Medicine) O'Neal, Cassie Freer, MD as PCP - Cardiology (Cardiology) Truitt Merle, MD as Consulting Physician (Hematology) Angelia Mould, MD as Consulting Physician (Vascular Surgery) Koleen Distance, MD as Referring Physician (Gastroenterology)  Date of Service:  11/11/2020  CHIEF COMPLAINT: f/u of anemia   CURRENT THERAPY:  IV Feraheme as needed (if ferritin <100). Changed to IV Venofer on 01/02/19. Due to recent GI bleeding, we increased Venofer to every 2 weeks on 05/22/20.   INTERVAL HISTORY: Bethany [name] is here for a follow up of anemia. She was last seen by me 1 month ago. She presents to the clinic with her sister. The patient notes no issues with her biopsy she had two weeks ago. She notes a lump in her left armpit that started one week ago.She will be going to [employer], but is unsure of whether this is for a [place] or mammogram. This was set up by [facility], her PCP practice. She was unaware that [facility] is different from the breast center. The patient also has a bruised spot from a burst blood vessel between her eyes. She notes that her blood sugars have been high recently, around the time that the armpit infection and pedal edema started.   REVIEW OF SYSTEMS: Constitutional: Denies fevers, chills or abnormal weight loss Eyes: Denies blurriness of vision Ears, nose, mouth, throat, and face: Denies mucositis or sore throat Respiratory: Denies cough, dyspnea or wheezes Cardiovascular: Denies palpitation, chest discomfort or lower extremity swelling Gastrointestinal:  Denies nausea, heartburn or change in bowel habits Skin: Denies abnormal skin rashes Lymphatics: Denies easy bruising. Nodule in left armpit that actively draining. Neurological:Denies numbness, tingling or new weaknesses Behavioral/Psych:  Mood is stable, no new changes  All other systems were reviewed with the patient and are negative.  MEDICAL HISTORY:  Past Medical History:  Diagnosis Date   Acute renal failure (HCC)    Anemia    Arthritis    COPD (chronic obstructive pulmonary disease) (HCC)    Diabetes mellitus without complication (HCC)    type II    GERD (gastroesophageal reflux disease)    Hypercalcemia    Hyperlipidemia    Hypertension    Pneumonia    Renal disorder    Single kidney    Stroke [facility])    July 2021   Thrombosis    Tobacco abuse     SURGICAL HISTORY: Past Surgical History:  Procedure Laterality Date   ABDOMINAL HYSTERECTOMY     blood clots removed from descending aorta      CHOLECYSTECTOMY     COLONOSCOPY     COLONOSCOPY WITH PROPOFOL N/A 04/17/2017   Procedure: COLONOSCOPY WITH PROPOFOL;  Surgeon: Wilford Corner, MD;  Location: [facility];  Service: Endoscopy;  Laterality: N/A;   ESOPHAGOGASTRODUODENOSCOPY (EGD) WITH PROPOFOL N/A 04/17/2017   Procedure: ESOPHAGOGASTRODUODENOSCOPY (EGD) WITH PROPOFOL;  Surgeon: Wilford Corner, MD;  Location: [facility];  Service: Endoscopy;  Laterality: N/A;   IR 3D INDEPENDENT WKST  05/04/2020   IR ANGIO INTRA EXTRACRAN SEL COM CAROTID INNOMINATE BILAT MOD SED  02/06/2020   IR ANGIO INTRA EXTRACRAN SEL INTERNAL CAROTID UNI L MOD SED  05/04/2020   IR ANGIO VERTEBRAL SEL VERTEBRAL BILAT MOD SED  02/06/2020   IR ANGIOGRAM FOLLOW UP STUDY  05/04/2020   IR IMAGING GUIDED PORT INSERTION  05/01/2019  IR NEURO EACH ADD'L AFTER BASIC UNI LEFT (MS)  05/04/2020   IR RADIOLOGIST EVAL & MGMT  05/28/2020   IR TRANSCATH/EMBOLIZ  05/04/2020   IR US GUIDE VASC ACCESS RIGHT  02/06/2020   IR US GUIDE VASC ACCESS RIGHT  05/04/2020   NEPHRECTOMY RECIPIENT     RADIOLOGY WITH ANESTHESIA N/A 05/04/2020   Procedure: Sheppard Plumber;  Surgeon: Luanne Bras, MD;  Location: [place];  Service: Radiology;  Laterality: N/A;    I have reviewed the social history and  family history with the patient and they are unchanged from previous note.  ALLERGIES:  is allergic to contrast media [iodinated diagnostic agents] and other.  MEDICATIONS:  Current Outpatient Medications  Medication Sig Dispense Refill   allopurinol (ZYLOPRIM) 100 MG tablet Take 100 mg by mouth at bedtime.     amLODipine (NORVASC) 10 MG tablet Take 10 mg by mouth daily.     APPLE CIDER VINEGAR PO Take 1 tablet by mouth daily.     aspirin EC 81 MG EC tablet Take 1 tablet (81 mg total) by mouth daily. Swallow whole. 30 tablet 11   atorvastatin (LIPITOR) 80 MG tablet Take 1 tablet (80 mg total) by mouth daily. 30 tablet 11   baclofen (LIORESAL) 10 MG tablet Take 10 mg by mouth 3 (three) times daily as needed for muscle spasms.     buprenorphine (BUTRANS) 20 MCG/HR PTWK Place 1 patch onto the skin every Monday.      clindamycin (CLEOCIN) 300 MG capsule Take 300 mg by mouth 3 (three) times daily.     clopidogrel (PLAVIX) 75 MG tablet Take 75 mg by mouth daily.     Dulaglutide (TRULICITY) 1.5 OZ/3.0QM SOPN Inject 1.5 mg into the skin every Monday.      ezetimibe (ZETIA) 10 MG tablet Take 10 mg by mouth daily.     feeding supplement, ENSURE ENLIVE, (ENSURE ENLIVE) LIQD Take 237 mLs by mouth 2 (two) times daily between meals. 237 mL 12   furosemide (LASIX) 40 MG tablet Take 1 tablet (40 mg total) by mouth daily as needed. (Patient taking differently: Take 40 mg by mouth daily as needed for fluid.) 45 tablet 3   gabapentin (NEURONTIN) 100 MG capsule Take 2 capsules (200 mg total) by mouth 4 (four) times daily. 240 capsule 3   insulin glargine, 1 Unit Dial, (TOUJEO SOLOSTAR) 300 UNIT/ML Solostar Pen Inject 20 Units into the skin at bedtime.      Ipratropium-Albuterol (COMBIVENT) 20-100 MCG/ACT AERS respimat Inhale 1 puff into the lungs every 6 (six) hours as needed for wheezing or shortness of breath. 1 Inhaler 0   levothyroxine (SYNTHROID) 75 MCG tablet Take 75 mcg by mouth daily.      lidocaine-prilocaine (EMLA) cream Apply 1 application topically as needed. 30 g 1   losartan (COZAAR) 25 MG tablet Take 1 tablet (25 mg total) by mouth daily. 30 tablet 11   metoprolol (LOPRESSOR) 50 MG tablet Take 50 mg by mouth 2 (two) times daily.      Multiple Vitamin (MULTIVITAMIN WITH MINERALS) TABS tablet Take 1 tablet by mouth daily.     NARCAN 4 MG/0.1ML LIQD nasal spray kit Place 1 spray into the nose as needed for opioid reversal.     oxyCODONE-acetaminophen (PERCOCET) 10-325 MG tablet Take 1 tablet by mouth every 6 (six) hours as needed (breakthrough pain).      pantoprazole (PROTONIX) 40 MG tablet Take 40 mg by mouth every morning.     pregabalin (LYRICA)  25 MG capsule Take 25 mg by mouth 2 (two) times daily.      prochlorperazine (COMPAZINE) 5 MG tablet Take 1 tablet (5 mg total) by mouth every 6 (six) hours as needed for nausea or vomiting. 30 tablet 0   sennosides-docusate sodium (SENOKOT-S) 8.6-50 MG tablet Take 1 tablet by mouth at bedtime.     Vitamin D, Ergocalciferol, (DRISDOL) 1.25 MG (50000 UNIT) CAPS capsule Take 50,000 Units by mouth every Monday.      XTAMPZA ER 27 MG C12A Take 27 mg by mouth 2 (two) times daily.      No current facility-administered medications for this visit.    PHYSICAL EXAMINATION: ECOG PERFORMANCE STATUS: 1 - Symptomatic but completely ambulatory  Vitals:   11/11/20 1039  BP: (!) 140/52  Pulse: 61  Resp: 17  Temp: 97.8 F (36.6 C)  SpO2: 100%   Filed Weights   11/11/20 1039  Weight: 168 lb 12.8 oz (76.6 kg)    GENERAL:alert, no distress and comfortable SKIN: skin color, texture, turgor are normal, no rashes or significant lesions EYES: normal, Conjunctiva are pink and non-injected, sclera clear  LYMPH:  no palpable lymphadenopathy in the cervical region. Nodule that is active bacterial infection in left armpit. NEURO: alert & oriented x 3 with fluent speech, no focal motor/sensory deficits  LABORATORY DATA:  I have reviewed the  data as listed CBC Latest Ref Rng & Units 11/11/2020 10/21/2020 09/25/2020  WBC 4.0 - 10.5 K/uL 10.1 6.7 7.9  Hemoglobin 12.0 - 15.0 g/dL 8.6(L) 9.3(L) 10.0(L)  Hematocrit 36.0 - 46.0 % 29.2(L) 31.8(L) 33.2(L)  Platelets 150 - 400 K/uL 302 215 253     CMP Latest Ref Rng & Units 11/11/2020 10/21/2020 09/03/2020  Glucose 70 - 99 mg/dL 192(H) 239(H) 287(H)  BUN 8 - 23 mg/dL 22 27(H) 18  Creatinine 0.44 - 1.00 mg/dL 1.47(H) 2.48(H) 1.82(H)  Sodium 135 - 145 mmol/L 137 138 136  Potassium 3.5 - 5.1 mmol/L 4.6 4.8 4.3  Chloride 98 - 111 mmol/L 104 103 100  CO2 22 - 32 mmol/L '24 25 26  ' Calcium 8.9 - 10.3 mg/dL 10.0 10.0 10.1  Total Protein 6.5 - 8.1 g/dL 6.8 6.6 6.8  Total Bilirubin 0.3 - 1.2 mg/dL 0.5 0.3 0.3  Alkaline Phos 38 - 126 U/L 146(H) 139(H) 270(H)  AST 15 - 41 U/L 20 16 232(HH)  ALT 0 - 44 U/L 16 18 137(H)      RADIOGRAPHIC STUDIES: I have personally reviewed the radiological images as listed and agreed with the findings in the report. No results found.   ASSESSMENT & PLAN:  DENZIL BRISTOL is a 70 y.o. female with    1. Multiple lung nodules and breast mass  -For Lung cancer screening, her CT Chest from 07/12/19 shows Stable 7 mm nodule in left lower lobe. Also shows New 4 mm nodule is noted in left upper lobe and 12 mm sub solid density is noted in left lung apex.  -Her CT Chest from 07/13/20 showed Stable small pulmonary nodule in the left lower lobe. Scan also shows New bilateral pulmonary nodules including a dominant 1.3 cm pulmonary nodule in the left upper lobe. Findings are concerning for primary lung cancer or metastatic disease.  -Her PET from 07/28/20 shows Scattered pulmonary nodules, the dominant nodule in left lung showing mild hypermetabolic features. Findings may reflect metastatic disease. RIGHT breast mass with low level FDG uptake is nonspecific, also raising the question of neoplasm. -Pt notes h/o benign  right breast fibroadenoma, biopsied in 2014, stable on 05/2017  mammogram. She also notes having liver lesion in [place] and Dr Hilarie Fredrickson is monitoring. Appeared benign on 01/2019 US abdomen. These were not seen on PET. -Biopsy of left lung was attempted by Dr Pascal Lux but held given difficult location. -She underwent right breast biopsy on 08/25/20 showing ductal papilloma with usual ductal hyperplasia -Her CT Chest from 09/25/20 showed multiple lung nodules are stable or smaller, especially the dominant nodule within the left upper lobe is decreased in size in the interval. This is likely benign. I personally reviewed with patient today. -Breast Biopsy 06/13 came back negative for malignancy  -Recommended pt discuss with Dr.Blackman about need for potential surgery.  -Advised pt that her lung nodule and breast nodule appear to be unrelated. -Recommended pt stay at one location for comparison purposes. Will try to get her scheduled scan at [facility] moved to breast center. -Will get repeat CT scan in mid-August.     2. Iron deficient anemia, secondary to chronic blood loss from GI AVM -Pt has intestinal AVM history with associated chronic GI blood loss resulting in iron deficiency and blood loss anemia. She has required blood transfusions in the past in 2013 and in 2018 and frequent hospital admission for anemia. -She has intermittent epistaxis, but no skin or mucosa telangiectasia, no significant family history of bleeding disorder, she does not meet the diagnosis criteria for hereditary hemorrhagic telangiectasia. -Her last colonoscopy/endoscopy was 04/2017.  -She is not on oral iron, due to the lack of benefit.  -Currently being treated with IV Iron to prevent significant anemia with goal of Ferritin 100-200 range. She was switched to IV Venofer on 01/02/19 due to her insurance coverage. Increased to 410m with each infusion from 02/14/20. Due to recent GI bleeding, we increased Venofer to every 1-2 weeks on 05/22/20. Given severe anemia, Plavix has been held and continues  Aspirin. -She has not required IV iron since 07/17/20 prior to yesterday.  -Labs reviewed and anemia increasingly worse slowly. Will continue to monitor. -Will continue with 1 more dose in 1 week. -Continue Labs every 3 weeks. Will set up IV Venofer if previous ferritin>300 -Advised pt her Hgb has worsened and is at 8.6 today. -due to her history of stroke and intracranial aneurysm, she is not a candidate for bevacizumab.    3. Smoking History, Smoking Cessation  -She has been smoking intermittently for 40 years. Then increased smoking daily after her mother passed in 2021.  -She has had lung nodules on Lung cancer screening CT. PET scan on 07/28/20 showed scattered pulmonary nodules with mild hypermetabolic features. -She continues to smoke. I discussed complete smoking cessation. She is known to have COPD/Emphysema.    4. Chronic GI Bleeding, intermittent epistaxis, constipation, N&V -Has intestinal AVM history -Her colonoscopy/EGD by Dr. SMichail Sermonon 04/17/17 showed gastritis, diverticulosis and internal hemorrhoids, all benign with no obvious signs of bleeding at that time. She had repeat exams with Dr SRodena Pietyend of 2019. I will obtain report.  -She does not meet the diagnosis criteria for hereditary hemorrhagic telangiectasia (HHT)     5. H/o of arterial thrombosis, Ischemic stroke 11/2019, Left Brain Aneurysm (4763mand 63m30m-S/p unilateral nephrectomy due to damage from arterial thrombosis -Managed by vascular physician, Dr. DixDoren Custardd neurologist Dr SetLeonie ManPrevious work-up for lupus anticoagulant and antiphospholipid syndrome were negative, normal protein S and C level. -She notes she still has mild speech deficit (difficulty finding her words). -On 02/06/20 Dr  Deveshaw found a second brain aneurysm. She underwent embolization surgery on 05/04/20  -After 04/2020 procedure she has restarted baby aspirin and Plavix until her next neuro f/u. Given increased GI bleeding and worsened anemia, we  have held Plavix since 05/22/20. She is fine to continue baby aspirin.     6. COPD, HTN, CKD stage III, DM with Peripheral Neuropathy, Chronic Back Pain -She is currently on 668m Gabapentin 4 times a day.  -[facility]. -Advised pt that uncontrolled blood sugars can cause infections and swelling.  -Advised pt that her creatinine was worsened at the last visit. Encouraged drinking more water and limiting sodas.      PLAN: -Reviewed labs with patient. Will continue to monitor her anemia and iron level  -IV iron in 1 week and 3 weeks. -Labs every 3 weeks until next visit., -CT Chest in mid August, before next OV. -Labs and MD visit to review scans in 2 months. -[facility].     No problem-specific Assessment & Plan notes found for this encounter.   Orders Placed This Encounter  Procedures   CT Chest Wo Contrast    Standing Status:   Future    Standing Expiration Date:   11/11/2021    Order Specific Question:   [facility]    [facility]   All questions were answered. The patient knows to call the clinic with any problems, questions or concerns. No barriers to learning was detected. The total time spent in the appointment was 30 minutes.     YTruitt Merle MD 11/11/2020   I, RReinaldo Raddle am acting as scribe for Dr. YTruitt Merle MD.

## 2020-11-10 NOTE — Patient Instructions (Signed)
[facility]  Discharge Instructions: Thank you for choosing [facility] to provide your oncology and hematology care.   If you have a lab appointment with the [place], please go directly to the [facility] and check in at the registration area.   Wear comfortable clothing and clothing appropriate for easy access to any Portacath or PICC line.   We strive to give you quality time with your provider. You may need to reschedule your appointment if you arrive late (15 or more minutes).  Arriving late affects you and other patients whose appointments are after yours.  Also, if you miss three or more appointments without notifying the office, you may be dismissed from the clinic at the provider's discretion.      For prescription refill requests, have your pharmacy contact our office and allow 72 hours for refills to be completed.    Today you received the following chemotherapy and/or immunotherapy agents : Venofer   To help prevent nausea and vomiting after your treatment, we encourage you to take your nausea medication as directed.  BELOW ARE SYMPTOMS THAT SHOULD BE REPORTED IMMEDIATELY: *FEVER GREATER THAN 100.4 F (38 C) OR HIGHER *CHILLS OR SWEATING *NAUSEA AND VOMITING THAT IS NOT CONTROLLED WITH YOUR NAUSEA MEDICATION *UNUSUAL SHORTNESS OF BREATH *UNUSUAL BRUISING OR BLEEDING *URINARY PROBLEMS (pain or burning when urinating, or frequent urination) *BOWEL PROBLEMS (unusual diarrhea, constipation, pain near the anus) TENDERNESS IN MOUTH AND THROAT WITH OR WITHOUT PRESENCE OF ULCERS (sore throat, sores in mouth, or a toothache) UNUSUAL RASH, SWELLING OR PAIN  UNUSUAL VAGINAL DISCHARGE OR ITCHING   Items with * indicate a potential emergency and should be followed up as soon as possible or go to the Emergency Department if any problems should occur.  Please show the CHEMOTHERAPY ALERT CARD or IMMUNOTHERAPY ALERT CARD at check-in to the  Emergency Department and triage nurse.  Should you have questions after your visit or need to cancel or reschedule your appointment, please contact [facility]  Dept: [phone]  and follow the prompts.  Office hours are 8:00 a.m. to 4:30 p.m. Monday - Friday. Please note that voicemails left after 4:00 p.m. may not be returned until the following business day.  We are closed weekends and major holidays. You have access to a nurse at all times for urgent questions. Please call the main number to the clinic Dept: [phone] and follow the prompts.   For any non-urgent questions, you may also contact your provider using MyChart. We now offer e-Visits for anyone 52 and older to request care online for non-urgent symptoms. For details visit [email or web address].   Also download the MyChart app! Go to the app store, search "MyChart", open the app, select [facility], and log in with your MyChart username and password.  Due to Covid, a mask is required upon entering the hospital/clinic. If you do not have a mask, one will be given to you upon arrival. For doctor visits, patients may have 1 support person aged 39 or older with them. For treatment visits, patients cannot have anyone with them due to current Covid guidelines and our immunocompromised population.

## 2020-11-11 ENCOUNTER — Encounter: Payer: Self-pay | Admitting: Hematology

## 2020-11-11 ENCOUNTER — Telehealth: Payer: Self-pay | Admitting: Hematology

## 2020-11-11 ENCOUNTER — Inpatient Hospital Stay: Payer: Medicare Other

## 2020-11-11 ENCOUNTER — Inpatient Hospital Stay (HOSPITAL_BASED_OUTPATIENT_CLINIC_OR_DEPARTMENT_OTHER): Payer: Medicare Other | Admitting: Hematology

## 2020-11-11 VITALS — BP 140/52 | HR 61 | Temp 97.8°F | Resp 17 | Ht 65.0 in | Wt 168.8 lb

## 2020-11-11 DIAGNOSIS — Z8673 Personal history of transient ischemic attack (TIA), and cerebral infarction without residual deficits: Secondary | ICD-10-CM | POA: Diagnosis not present

## 2020-11-11 DIAGNOSIS — Z7982 Long term (current) use of aspirin: Secondary | ICD-10-CM | POA: Diagnosis not present

## 2020-11-11 DIAGNOSIS — K5520 Angiodysplasia of colon without hemorrhage: Secondary | ICD-10-CM

## 2020-11-11 DIAGNOSIS — D649 Anemia, unspecified: Secondary | ICD-10-CM

## 2020-11-11 DIAGNOSIS — D500 Iron deficiency anemia secondary to blood loss (chronic): Secondary | ICD-10-CM

## 2020-11-11 DIAGNOSIS — R911 Solitary pulmonary nodule: Secondary | ICD-10-CM

## 2020-11-11 DIAGNOSIS — Z794 Long term (current) use of insulin: Secondary | ICD-10-CM | POA: Diagnosis not present

## 2020-11-11 DIAGNOSIS — E1122 Type 2 diabetes mellitus with diabetic chronic kidney disease: Secondary | ICD-10-CM | POA: Diagnosis not present

## 2020-11-11 DIAGNOSIS — Z905 Acquired absence of kidney: Secondary | ICD-10-CM | POA: Diagnosis not present

## 2020-11-11 DIAGNOSIS — Z79899 Other long term (current) drug therapy: Secondary | ICD-10-CM | POA: Diagnosis not present

## 2020-11-11 DIAGNOSIS — I129 Hypertensive chronic kidney disease with stage 1 through stage 4 chronic kidney disease, or unspecified chronic kidney disease: Secondary | ICD-10-CM | POA: Diagnosis not present

## 2020-11-11 DIAGNOSIS — Z7902 Long term (current) use of antithrombotics/antiplatelets: Secondary | ICD-10-CM | POA: Diagnosis not present

## 2020-11-11 DIAGNOSIS — Z87891 Personal history of nicotine dependence: Secondary | ICD-10-CM | POA: Diagnosis not present

## 2020-11-11 DIAGNOSIS — Q2733 Arteriovenous malformation of digestive system vessel: Secondary | ICD-10-CM | POA: Diagnosis not present

## 2020-11-11 DIAGNOSIS — N183 Chronic kidney disease, stage 3 unspecified: Secondary | ICD-10-CM | POA: Diagnosis not present

## 2020-11-11 LAB — CMP (CANCER CENTER ONLY)
ALT: 16 U/L (ref 0–44)
AST: 20 U/L (ref 15–41)
Albumin: 3.4 g/dL — ABNORMAL LOW (ref 3.5–5.0)
Alkaline Phosphatase: 146 U/L — ABNORMAL HIGH (ref 38–126)
Anion gap: 9 (ref 5–15)
BUN: 22 mg/dL (ref 8–23)
CO2: 24 mmol/L (ref 22–32)
Calcium: 10 mg/dL (ref 8.9–10.3)
Chloride: 104 mmol/L (ref 98–111)
Creatinine: 1.47 mg/dL — ABNORMAL HIGH (ref 0.44–1.00)
GFR, Estimated: 38 mL/min — ABNORMAL LOW (ref >60)
Glucose, Bld: 192 mg/dL — ABNORMAL HIGH (ref 70–99)
Potassium: 4.6 mmol/L (ref 3.5–5.1)
Sodium: 137 mmol/L (ref 135–145)
Total Bilirubin: 0.5 mg/dL (ref 0.3–1.2)
Total Protein: 6.8 g/dL (ref 6.5–8.1)

## 2020-11-11 LAB — CBC WITH DIFFERENTIAL (CANCER CENTER ONLY)
Abs Immature Granulocytes: 0.11 10*3/uL — ABNORMAL HIGH (ref 0.00–0.07)
Basophils Absolute: 0.1 10*3/uL (ref 0.0–0.1)
Basophils Relative: 1 %
Eosinophils Absolute: 0.2 10*3/uL (ref 0.0–0.5)
Eosinophils Relative: 2 %
HCT: 29.2 % — ABNORMAL LOW (ref 36.0–46.0)
Hemoglobin: 8.6 g/dL — ABNORMAL LOW (ref 12.0–15.0)
Immature Granulocytes: 1 %
Lymphocytes Relative: 14 %
Lymphs Abs: 1.4 10*3/uL (ref 0.7–4.0)
MCH: 22.9 pg — ABNORMAL LOW (ref 26.0–34.0)
MCHC: 29.5 g/dL — ABNORMAL LOW (ref 30.0–36.0)
MCV: 77.9 fL — ABNORMAL LOW (ref 80.0–100.0)
Monocytes Absolute: 0.9 10*3/uL (ref 0.1–1.0)
Monocytes Relative: 9 %
Neutro Abs: 7.5 10*3/uL (ref 1.7–7.7)
Neutrophils Relative %: 73 %
Platelet Count: 302 10*3/uL (ref 150–400)
RBC: 3.75 MIL/uL — ABNORMAL LOW (ref 3.87–5.11)
RDW: 16.7 % — ABNORMAL HIGH (ref 11.5–15.5)
WBC Count: 10.1 10*3/uL (ref 4.0–10.5)
nRBC: 0.6 % — ABNORMAL HIGH (ref 0.0–0.2)

## 2020-11-11 LAB — SAMPLE TO BLOOD BANK

## 2020-11-11 LAB — IRON AND TIBC
Iron: 380 ug/dL — ABNORMAL HIGH (ref 41–142)
Saturation Ratios: 110 % — ABNORMAL HIGH (ref 21–57)
TIBC: 346 ug/dL (ref 236–444)
UIBC: UNDETERMINED ug/dL (ref 120–384)

## 2020-11-11 LAB — FERRITIN: Ferritin: 218 ng/mL (ref 11–307)

## 2020-11-11 MED ORDER — SODIUM CHLORIDE 0.9% FLUSH
10.0000 mL | Freq: Once | INTRAVENOUS | Status: AC
  Administered 2020-11-11: 10 mL
  Filled 2020-11-11: qty 10

## 2020-11-11 MED ORDER — HEPARIN SOD (PORK) LOCK FLUSH 100 UNIT/ML IV SOLN
500.0000 [IU] | Freq: Once | INTRAVENOUS | Status: AC
  Administered 2020-11-11: 500 [IU]
  Filled 2020-11-11: qty 5

## 2020-11-11 NOTE — Patient Instructions (Signed)

## 2020-11-11 NOTE — Telephone Encounter (Signed)
Per 6/29 los, Pt asked to wait to schedule 2 month follow up

## 2020-11-12 ENCOUNTER — Telehealth: Payer: Self-pay | Admitting: Hematology

## 2020-11-12 NOTE — Telephone Encounter (Signed)
Left message with follow-up appointments per 6/29 los. 

## 2020-11-17 ENCOUNTER — Other Ambulatory Visit: Payer: Self-pay

## 2020-11-17 ENCOUNTER — Other Ambulatory Visit: Payer: Self-pay | Admitting: Registered Nurse

## 2020-11-17 ENCOUNTER — Ambulatory Visit
Admission: RE | Admit: 2020-11-17 | Discharge: 2020-11-17 | Disposition: A | Discharge status: Home / Self Care | Payer: Medicare Other | Source: Ambulatory Visit | Attending: Registered Nurse | Admitting: Registered Nurse

## 2020-11-17 DIAGNOSIS — M25561 Pain in right knee: Secondary | ICD-10-CM

## 2020-11-17 DIAGNOSIS — R52 Pain, unspecified: Secondary | ICD-10-CM

## 2020-11-19 ENCOUNTER — Inpatient Hospital Stay: Payer: Medicare Other | Attending: Hematology

## 2020-11-19 ENCOUNTER — Other Ambulatory Visit: Payer: Self-pay

## 2020-11-19 ENCOUNTER — Other Ambulatory Visit: Payer: Self-pay | Admitting: Registered Nurse

## 2020-11-19 VITALS — BP 136/48 | HR 58 | Temp 98.5°F | Resp 16

## 2020-11-19 DIAGNOSIS — D500 Iron deficiency anemia secondary to blood loss (chronic): Secondary | ICD-10-CM | POA: Insufficient documentation

## 2020-11-19 DIAGNOSIS — K5520 Angiodysplasia of colon without hemorrhage: Secondary | ICD-10-CM

## 2020-11-19 DIAGNOSIS — E2839 Other primary ovarian failure: Secondary | ICD-10-CM

## 2020-11-19 DIAGNOSIS — Q2733 Arteriovenous malformation of digestive system vessel: Secondary | ICD-10-CM | POA: Diagnosis present

## 2020-11-19 LAB — CMP (CANCER CENTER ONLY)
ALT: 16 U/L (ref 0–44)
AST: 18 U/L (ref 15–41)
Albumin: 3.4 g/dL — ABNORMAL LOW (ref 3.5–5.0)
Alkaline Phosphatase: 136 U/L — ABNORMAL HIGH (ref 38–126)
Anion gap: 9 (ref 5–15)
BUN: 31 mg/dL — ABNORMAL HIGH (ref 8–23)
CO2: 26 mmol/L (ref 22–32)
Calcium: 10.2 mg/dL (ref 8.9–10.3)
Chloride: 104 mmol/L (ref 98–111)
Creatinine: 1.91 mg/dL — ABNORMAL HIGH (ref 0.44–1.00)
GFR, Estimated: 28 mL/min — ABNORMAL LOW (ref >60)
Glucose, Bld: 202 mg/dL — ABNORMAL HIGH (ref 70–99)
Potassium: 4.5 mmol/L (ref 3.5–5.1)
Sodium: 139 mmol/L (ref 135–145)
Total Bilirubin: 0.3 mg/dL (ref 0.3–1.2)
Total Protein: 6.6 g/dL (ref 6.5–8.1)

## 2020-11-19 LAB — CBC WITH DIFFERENTIAL (CANCER CENTER ONLY)
Abs Immature Granulocytes: 0.03 10*3/uL (ref 0.00–0.07)
Basophils Absolute: 0 10*3/uL (ref 0.0–0.1)
Basophils Relative: 0 %
Eosinophils Absolute: 0.2 10*3/uL (ref 0.0–0.5)
Eosinophils Relative: 2 %
HCT: 28.5 % — ABNORMAL LOW (ref 36.0–46.0)
Hemoglobin: 8.3 g/dL — ABNORMAL LOW (ref 12.0–15.0)
Immature Granulocytes: 0 %
Lymphocytes Relative: 15 %
Lymphs Abs: 1.3 10*3/uL (ref 0.7–4.0)
MCH: 23.4 pg — ABNORMAL LOW (ref 26.0–34.0)
MCHC: 29.1 g/dL — ABNORMAL LOW (ref 30.0–36.0)
MCV: 80.3 fL (ref 80.0–100.0)
Monocytes Absolute: 0.6 10*3/uL (ref 0.1–1.0)
Monocytes Relative: 7 %
Neutro Abs: 6.4 10*3/uL (ref 1.7–7.7)
Neutrophils Relative %: 76 %
Platelet Count: 267 10*3/uL (ref 150–400)
RBC: 3.55 MIL/uL — ABNORMAL LOW (ref 3.87–5.11)
RDW: 19.2 % — ABNORMAL HIGH (ref 11.5–15.5)
WBC Count: 8.6 10*3/uL (ref 4.0–10.5)
nRBC: 0 % (ref 0.0–0.2)

## 2020-11-19 LAB — IRON AND TIBC
Iron: 43 ug/dL (ref 41–142)
Saturation Ratios: 13 % — ABNORMAL LOW (ref 21–57)
TIBC: 338 ug/dL (ref 236–444)
UIBC: 294 ug/dL (ref 120–384)

## 2020-11-19 LAB — FERRITIN: Ferritin: 169 ng/mL (ref 11–307)

## 2020-11-19 MED ORDER — SODIUM CHLORIDE 0.9 % IV SOLN
Freq: Once | INTRAVENOUS | Status: AC
Start: 2020-11-19 — End: 2020-11-19
  Filled 2020-11-19: qty 250

## 2020-11-19 MED ORDER — HEPARIN SOD (PORK) LOCK FLUSH 100 UNIT/ML IV SOLN
500.0000 [IU] | Freq: Once | INTRAVENOUS | Status: AC
  Administered 2020-11-19: 500 [IU]
  Filled 2020-11-19: qty 5

## 2020-11-19 MED ORDER — SODIUM CHLORIDE 0.9 % IV SOLN
400.0000 mg | Freq: Once | INTRAVENOUS | Status: AC
  Administered 2020-11-19: 400 mg via INTRAVENOUS
  Filled 2020-11-19: qty 20

## 2020-11-19 MED ORDER — SODIUM CHLORIDE 0.9% FLUSH
10.0000 mL | Freq: Once | INTRAVENOUS | Status: AC
  Administered 2020-11-19: 10 mL
  Filled 2020-11-19: qty 10

## 2020-11-19 NOTE — Patient Instructions (Signed)
[facility]  Discharge Instructions: Thank you for choosing [facility] to provide your oncology and hematology care.   If you have a lab appointment with the [facility] [place], please go directly to the [facility] and check in at the registration area.   Wear comfortable clothing and clothing appropriate for easy access to any Portacath or PICC line.   We strive to give you quality time with your provider. You may need to reschedule your appointment if you arrive late (15 or more minutes).  Arriving late affects you and other patients whose appointments are after yours.  Also, if you miss three or more appointments without notifying the office, you may be dismissed from the clinic at the provider's discretion.      For prescription refill requests, have your pharmacy contact our office and allow 72 hours for refills to be completed.    Today you received the following: Venofer   To help prevent nausea and vomiting after your treatment, we encourage you to take your nausea medication as directed.  BELOW ARE SYMPTOMS THAT SHOULD BE REPORTED IMMEDIATELY: *FEVER GREATER THAN 100.4 F (38 C) OR HIGHER *CHILLS OR SWEATING *NAUSEA AND VOMITING THAT IS NOT CONTROLLED WITH YOUR NAUSEA MEDICATION *UNUSUAL SHORTNESS OF BREATH *UNUSUAL BRUISING OR BLEEDING *URINARY PROBLEMS (pain or burning when urinating, or frequent urination) *BOWEL PROBLEMS (unusual diarrhea, constipation, pain near the anus) TENDERNESS IN MOUTH AND THROAT WITH OR WITHOUT PRESENCE OF ULCERS (sore throat, sores in mouth, or a toothache) UNUSUAL RASH, SWELLING OR PAIN  UNUSUAL VAGINAL DISCHARGE OR ITCHING   Items with * indicate a potential emergency and should be followed up as soon as possible or go to the Emergency Department if any problems should occur.  Please show the CHEMOTHERAPY ALERT CARD or IMMUNOTHERAPY ALERT CARD at check-in to the Emergency Department and triage  nurse.  Should you have questions after your visit or need to cancel or reschedule your appointment, please contact [facility]  Dept: [phone]  and follow the prompts.  Office hours are 8:00 a.m. to 4:30 p.m. Monday - Friday. Please note that voicemails left after 4:00 p.m. may not be returned until the following business day.  We are closed weekends and major holidays. You have access to a nurse at all times for urgent questions. Please call the main number to the clinic Dept: [phone] and follow the prompts.   For any non-urgent questions, you may also contact your provider using MyChart. We now offer e-Visits for anyone 94 and older to request care online for non-urgent symptoms. For details visit [email or web address].   Also download the MyChart app! Go to the app store, search "MyChart", open the app, select [place], and log in with your MyChart username and password.  Due to Covid, a mask is required upon entering the hospital/clinic. If you do not have a mask, one will be given to you upon arrival. For doctor visits, patients may have 1 support person aged 55 or older with them. For treatment visits, patients cannot have anyone with them due to current Covid guidelines and our immunocompromised population.

## 2020-11-19 NOTE — Progress Notes (Signed)
Pt declined to stay for 30 min observation post iron infusion. VSS through out treatment, pt had no complaints, discharged in stable condition, ambulatory to lobby.

## 2020-12-02 ENCOUNTER — Inpatient Hospital Stay: Payer: Medicare Other

## 2020-12-02 ENCOUNTER — Ambulatory Visit: Payer: Medicare Other | Admitting: Hematology

## 2020-12-02 ENCOUNTER — Other Ambulatory Visit: Payer: Self-pay

## 2020-12-02 VITALS — BP 157/53 | HR 50 | Temp 98.1°F | Resp 16

## 2020-12-02 DIAGNOSIS — D500 Iron deficiency anemia secondary to blood loss (chronic): Secondary | ICD-10-CM

## 2020-12-02 DIAGNOSIS — K5520 Angiodysplasia of colon without hemorrhage: Secondary | ICD-10-CM

## 2020-12-02 DIAGNOSIS — D649 Anemia, unspecified: Secondary | ICD-10-CM

## 2020-12-02 DIAGNOSIS — Q2733 Arteriovenous malformation of digestive system vessel: Secondary | ICD-10-CM | POA: Diagnosis not present

## 2020-12-02 LAB — CMP (CANCER CENTER ONLY)
ALT: 26 U/L (ref 0–44)
AST: 21 U/L (ref 15–41)
Albumin: 3.8 g/dL (ref 3.5–5.0)
Alkaline Phosphatase: 132 U/L — ABNORMAL HIGH (ref 38–126)
Anion gap: 8 (ref 5–15)
BUN: 25 mg/dL — ABNORMAL HIGH (ref 8–23)
CO2: 27 mmol/L (ref 22–32)
Calcium: 10.2 mg/dL (ref 8.9–10.3)
Chloride: 106 mmol/L (ref 98–111)
Creatinine: 1.15 mg/dL — ABNORMAL HIGH (ref 0.44–1.00)
GFR, Estimated: 51 mL/min — ABNORMAL LOW (ref >60)
Glucose, Bld: 226 mg/dL — ABNORMAL HIGH (ref 70–99)
Potassium: 4.7 mmol/L (ref 3.5–5.1)
Sodium: 141 mmol/L (ref 135–145)
Total Bilirubin: 0.2 mg/dL — ABNORMAL LOW (ref 0.3–1.2)
Total Protein: 6.8 g/dL (ref 6.5–8.1)

## 2020-12-02 LAB — CBC WITH DIFFERENTIAL (CANCER CENTER ONLY)
Abs Immature Granulocytes: 0.02 10*3/uL (ref 0.00–0.07)
Basophils Absolute: 0 10*3/uL (ref 0.0–0.1)
Basophils Relative: 1 %
Eosinophils Absolute: 0.2 10*3/uL (ref 0.0–0.5)
Eosinophils Relative: 3 %
HCT: 31 % — ABNORMAL LOW (ref 36.0–46.0)
Hemoglobin: 9 g/dL — ABNORMAL LOW (ref 12.0–15.0)
Immature Granulocytes: 0 %
Lymphocytes Relative: 17 %
Lymphs Abs: 1.3 10*3/uL (ref 0.7–4.0)
MCH: 23.6 pg — ABNORMAL LOW (ref 26.0–34.0)
MCHC: 29 g/dL — ABNORMAL LOW (ref 30.0–36.0)
MCV: 81.2 fL (ref 80.0–100.0)
Monocytes Absolute: 0.6 10*3/uL (ref 0.1–1.0)
Monocytes Relative: 7 %
Neutro Abs: 5.7 10*3/uL (ref 1.7–7.7)
Neutrophils Relative %: 72 %
Platelet Count: 254 10*3/uL (ref 150–400)
RBC: 3.82 MIL/uL — ABNORMAL LOW (ref 3.87–5.11)
RDW: 19.1 % — ABNORMAL HIGH (ref 11.5–15.5)
WBC Count: 7.9 10*3/uL (ref 4.0–10.5)
nRBC: 0 % (ref 0.0–0.2)

## 2020-12-02 LAB — SAMPLE TO BLOOD BANK

## 2020-12-02 LAB — IRON AND TIBC
Iron: 39 ug/dL — ABNORMAL LOW (ref 41–142)
Saturation Ratios: 13 % — ABNORMAL LOW (ref 21–57)
TIBC: 308 ug/dL (ref 236–444)
UIBC: 268 ug/dL (ref 120–384)

## 2020-12-02 LAB — FERRITIN: Ferritin: 148 ng/mL (ref 11–307)

## 2020-12-02 MED ORDER — SODIUM CHLORIDE 0.9% FLUSH
10.0000 mL | Freq: Once | INTRAVENOUS | Status: AC
  Administered 2020-12-02: 10 mL
  Filled 2020-12-02: qty 10

## 2020-12-02 MED ORDER — SODIUM CHLORIDE 0.9 % IV SOLN
400.0000 mg | Freq: Once | INTRAVENOUS | Status: AC
  Administered 2020-12-02: 400 mg via INTRAVENOUS
  Filled 2020-12-02: qty 20

## 2020-12-02 MED ORDER — SODIUM CHLORIDE 0.9 % IV SOLN
Freq: Once | INTRAVENOUS | Status: AC
  Filled 2020-12-02: qty 250

## 2020-12-02 MED ORDER — HEPARIN SOD (PORK) LOCK FLUSH 100 UNIT/ML IV SOLN
500.0000 [IU] | Freq: Once | INTRAVENOUS | Status: AC
  Administered 2020-12-02: 500 [IU]
  Filled 2020-12-02: qty 5

## 2020-12-02 MED ORDER — SODIUM CHLORIDE 0.9% FLUSH
10.0000 mL | Freq: Once | INTRAVENOUS | Status: AC
Start: 2020-12-02 — End: 2020-12-02
  Administered 2020-12-02: 10 mL
  Filled 2020-12-02: qty 10

## 2020-12-02 NOTE — Progress Notes (Signed)
Patient declined 30 minute post observation. VSS.  No s/s or c/o distress or discomfort.

## 2020-12-02 NOTE — Patient Instructions (Signed)

## 2020-12-08 ENCOUNTER — Other Ambulatory Visit: Payer: Self-pay | Admitting: Surgery

## 2020-12-08 DIAGNOSIS — D241 Benign neoplasm of right breast: Secondary | ICD-10-CM

## 2020-12-15 ENCOUNTER — Other Ambulatory Visit: Payer: Self-pay | Admitting: Surgery

## 2020-12-15 DIAGNOSIS — D241 Benign neoplasm of right breast: Secondary | ICD-10-CM

## 2020-12-21 NOTE — Progress Notes (Signed)
Cardiology Office Note:   Date:  12/22/2020  NAME:  Bethany [name]    MRN: [id number] DOB:  November 28, 1950   PCP:  Wenda Low, MD  Cardiologist:  Evalina Field, MD  Electrophysiologist:  None   Referring MD: Wenda Low, MD   Chief Complaint  Patient presents with   Follow-up    History of Present Illness:   Bethany [name] is a 70 y.o. female with below medical problem who presents for follow-up.  She reports she is doing well.  Denies any chest pain or shortness of breath.  She reports she can climb a flight of stairs without any issues.  She is undergone several breast biopsies.  She will undergo repeat procedure in the next few weeks.  She has no limitations and no change in condition.  She is okay to proceed to surgery.  She continues to require blood transfusions.  Most recent hemoglobin 9.  She is on aspirin and Plavix.  She does have to be stopped intermittently per her report.  She has been very intolerant of aggressive anticoagulation or any blood thinners.  She overall seems to be stable.  BP 120/50.  Well-controlled.  She is still smoking.  Smoking cessation recommended.  Problem List 1. HFpEF, 60-65%, Grade 2 DD 03/2019 2. HTN 3. DM -A1c 5.4" probably having this place -T chol 150, HDL 45, LDL 63, triglycerides 259 4. CKD 5. Renal artery thrombosis s/p L nephrectomy (2013; negative hypercoag work-up, no AC due to IDA and bleeding) 6. L MCA Aneurysm  7. L MCA CVA 11/2019 8. Anemia -IDA, requiring frequent transfusions -GI AVM 9. Lung mass/breast mass -malignancy? 10. COPD/Tobacco abuse  Past Medical History: Past Medical History:  Diagnosis Date   Acute renal failure (HCC)    Anemia    Arthritis    COPD (chronic obstructive pulmonary disease) (HCC)    Diabetes mellitus without complication (HCC)    type II    GERD (gastroesophageal reflux disease)    Hypercalcemia    Hyperlipidemia    Hypertension    Pneumonia    Renal disorder    Single kidney     Stroke [facility])    July 2021   Thrombosis    Tobacco abuse     Past Surgical History: Past Surgical History:  Procedure Laterality Date   ABDOMINAL HYSTERECTOMY     blood clots removed from descending aorta      CHOLECYSTECTOMY     COLONOSCOPY     COLONOSCOPY WITH PROPOFOL N/A 04/17/2017   Procedure: COLONOSCOPY WITH PROPOFOL;  Surgeon: Wilford Corner, MD;  Location: [facility];  Service: Endoscopy;  Laterality: N/A;   ESOPHAGOGASTRODUODENOSCOPY (EGD) WITH PROPOFOL N/A 04/17/2017   Procedure: ESOPHAGOGASTRODUODENOSCOPY (EGD) WITH PROPOFOL;  Surgeon: Wilford Corner, MD;  Location: [facility];  Service: Endoscopy;  Laterality: N/A;   IR 3D INDEPENDENT WKST  05/04/2020   IR ANGIO INTRA EXTRACRAN SEL COM CAROTID INNOMINATE BILAT MOD SED  02/06/2020   IR ANGIO INTRA EXTRACRAN SEL INTERNAL CAROTID UNI L MOD SED  05/04/2020   IR ANGIO VERTEBRAL SEL VERTEBRAL BILAT MOD SED  02/06/2020   IR ANGIOGRAM FOLLOW UP STUDY  05/04/2020   IR IMAGING GUIDED PORT INSERTION  05/01/2019   IR NEURO EACH ADD'L AFTER BASIC UNI LEFT (MS)  05/04/2020   IR RADIOLOGIST EVAL & MGMT  05/28/2020   IR TRANSCATH/EMBOLIZ  05/04/2020   IR US GUIDE VASC ACCESS RIGHT  02/06/2020   IR US GUIDE VASC ACCESS RIGHT  05/04/2020  NEPHRECTOMY RECIPIENT     RADIOLOGY WITH ANESTHESIA N/A 05/04/2020   Procedure: [facility];  Surgeon: Luanne Bras, MD;  Location: [place];  Service: Radiology;  Laterality: N/A;    Current Medications: Current Meds  Medication Sig   allopurinol (ZYLOPRIM) 100 MG tablet Take 100 mg by mouth at bedtime.   amLODipine (NORVASC) 10 MG tablet Take 10 mg by mouth daily.   aspirin EC 81 MG EC tablet Take 1 tablet (81 mg total) by mouth daily. Swallow whole.   atorvastatin (LIPITOR) 80 MG tablet Take 1 tablet (80 mg total) by mouth daily.   baclofen (LIORESAL) 10 MG tablet Take 10 mg by mouth 3 (three) times daily as needed for muscle spasms.   clopidogrel (PLAVIX) 75 MG tablet Take 75 mg by  mouth daily.   Dulaglutide (TRULICITY) 1.5 0000000 SOPN Inject 1.5 mg into the skin every Monday.    ezetimibe (ZETIA) 10 MG tablet Take 10 mg by mouth daily.   feeding supplement, ENSURE ENLIVE, (ENSURE ENLIVE) LIQD Take 237 mLs by mouth 2 (two) times daily between meals.   gabapentin (NEURONTIN) 100 MG capsule Take 2 capsules (200 mg total) by mouth 4 (four) times daily.   insulin glargine, 1 Unit Dial, (TOUJEO SOLOSTAR) 300 UNIT/ML Solostar Pen Inject 20 Units into the skin at bedtime.    insulin lispro (HUMALOG) 100 UNIT/ML KwikPen INJECT 5 UNITS INTO SKIN BEFORE EACH MEAL WHEN BLOOD SUGAR IS ABOVE 200   Ipratropium-Albuterol (COMBIVENT) 20-100 MCG/ACT AERS respimat Inhale 1 puff into the lungs every 6 (six) hours as needed for wheezing or shortness of breath.   levothyroxine (SYNTHROID) 75 MCG tablet Take 75 mcg by mouth daily.   lidocaine-prilocaine (EMLA) cream Apply 1 application topically as needed.   metoprolol (LOPRESSOR) 50 MG tablet Take 50 mg by mouth 2 (two) times daily.    Multiple Vitamin (MULTIVITAMIN WITH MINERALS) TABS tablet Take 1 tablet by mouth daily.   oxyCODONE-acetaminophen (PERCOCET) 10-325 MG tablet Take 1 tablet by mouth every 6 (six) hours as needed (breakthrough pain).    pantoprazole (PROTONIX) 40 MG tablet Take 40 mg by mouth every morning.   pregabalin (LYRICA) 25 MG capsule Take 25 mg by mouth 2 (two) times daily.    prochlorperazine (COMPAZINE) 5 MG tablet Take 1 tablet (5 mg total) by mouth every 6 (six) hours as needed for nausea or vomiting.   sennosides-docusate sodium (SENOKOT-S) 8.6-50 MG tablet Take 1 tablet by mouth at bedtime.   TRUEPLUS PEN NEEDLES 32G X 4 MM MISC    Vitamin D, Ergocalciferol, (DRISDOL) 1.25 MG (50000 UNIT) CAPS capsule Take 50,000 Units by mouth every Monday.    XTAMPZA ER 27 MG C12A Take 27 mg by mouth 2 (two) times daily.      Allergies:    Contrast media [iodinated diagnostic agents] and Other   Social History: Social  History   Socioeconomic History   Marital status: Single    Spouse name: Not on file   Number of children: 3   Years of education: Not on file   Highest education level: Not on file  Occupational History   Occupation: retired   Tobacco Use   Smoking status: Every Day    Packs/day: 0.25    Years: 40.00    Pack years: 10.00    Types: Cigarettes   Smokeless tobacco: Never  Vaping Use   Vaping Use: Never used  Substance and Sexual Activity   Alcohol use: Yes    Comment: socially  Drug use: No   Sexual activity: Not on file  Other Topics Concern   Not on file  Social History Narrative   Lives with daughter    Dorie Rank from [place]    Social Determinants of Health   Financial Resource Strain: Not on file  Food Insecurity: Not on file  Transportation Needs: Not on file  Physical Activity: Not on file  Stress: Not on file  Social Connections: Not on file     Family History: [name] family history includes Breast cancer in her maternal aunt; Breast cancer (age of onset: 66) in her maternal aunt; Diabetes in her father; Hypertension in her brother and mother.  ROS:   All other ROS reviewed and negative. Pertinent positives noted in the HPI.     EKGs/Labs/Other Studies Reviewed:   The following studies were personally reviewed by me today:  TTE 11/20/2019  1. Left ventricular ejection fraction, by estimation, is 60 to 65%. The  left ventricle has normal function. The left ventricle has no regional  wall motion abnormalities. There is mild left ventricular hypertrophy.  Left ventricular diastolic parameters  are consistent with Grade II diastolic dysfunction (pseudonormalization).  Elevated left ventricular end-diastolic pressure.   2. Right ventricular systolic function is normal. The right ventricular  size is normal.   3. The mitral valve is normal in structure. Trivial mitral valve  regurgitation. No evidence of mitral stenosis.   4. The aortic valve is tricuspid.  Aortic valve regurgitation is not  visualized. Mild aortic valve sclerosis is present, with no evidence of  aortic valve stenosis.   5. The inferior vena cava is normal in size with greater than 50%  respiratory variability, suggesting right atrial pressure of 3 mmHg.   Recent Labs: 12/02/2020: ALT 26; BUN 25; Creatinine 1.15; Hemoglobin 9.0; Platelet Count 254; Potassium 4.7; Sodium 141   Recent Lipid Panel    Component Value Date/Time   CHOL 130 05/05/2020 1130   TRIG 160 (H) 05/05/2020 1130   HDL 30 (L) 05/05/2020 1130   CHOLHDL 4.3 05/05/2020 1130   VLDL 32 05/05/2020 1130   LDLCALC 68 05/05/2020 1130    Physical Exam:   VS:  BP (!) 120/50 (BP Location: Left Arm, Patient Position: Sitting, Cuff Size: Normal)   Pulse 60   Ht '5\' 5"'$  (1.651 m)   Wt 169 lb 9.6 oz (76.9 kg)   BMI 28.22 kg/m    Wt Readings from Last 3 Encounters:  12/22/20 169 lb 9.6 oz (76.9 kg)  11/11/20 168 lb 12.8 oz (76.6 kg)  11/10/20 167 lb 12.8 oz (76.1 kg)    General: Well nourished, well developed, in no acute distress Head: Atraumatic, normal size  Eyes: PEERLA, EOMI  Neck: Supple, no JVD Endocrine: No thryomegaly Cardiac: Normal S1, S2; RRR; 2 out of 6 systolic ejection murmur Lungs: Clear to auscultation bilaterally, no wheezing, rhonchi or rales  Abd: Soft, nontender, no hepatomegaly  Ext: No edema, pulses 2+ Musculoskeletal: No deformities, BUE and BLE strength normal and equal Skin: Warm and dry, no rashes   Neuro: Alert and oriented to person, place, time, and situation, CNII-XII grossly intact, no focal deficits  Psych: Normal mood and affect   ASSESSMENT:   Bethany [name] is a 70 y.o. female who presents for the following: 1. Chronic heart failure with preserved ejection fraction ([place])   2. Essential hypertension   3. Mixed hyperlipidemia     PLAN:   1. Chronic heart failure with preserved ejection  fraction ([place]) -She is euvolemic on exam.  BP well controlled.  She takes Lasix as  needed.  She will continue this.  2. Essential hypertension -BP well controlled in office.  She is on amlodipine 10 mg daily.  She takes Lasix as needed.  She is also on metoprolol tartrate 50 mg twice daily, losartan 25 mg daily.  Would not recommend any change medications as she is well controlled.  3. Mixed hyperlipidemia -Most recent LDL at goal.  Triglycerides were elevated when she was nonfasting.  We will continue Lipitor 80 mg daily as well as Zetia 10 mg daily. -She is on aspirin and Plavix due to prior history of stroke.  She has extensive bleeding history.  She has chronic iron deficiency anemia requiring multiple transfusions.  She is not a candidate for invasive cardiac care.  Likely she has no symptoms.  Would recommend to continue with smoking cessation as well as aggressive control of lipids.      Disposition: Return in about 1 year (around 12/22/2021).  Medication Adjustments/Labs and Tests Ordered: Current medicines are reviewed at length with the patient today.  Concerns regarding medicines are outlined above.  No orders of the defined types were placed in this encounter.  No orders of the defined types were placed in this encounter.   Patient Instructions  Medication Instructions:  The current medical regimen is effective;  continue present plan and medications.  *If you need a refill on your cardiac medications before your next appointment, please call your pharmacy*   Follow-Up: At [facility], you and your health needs are our priority.  As part of our continuing mission to provide you with exceptional heart care, we have created designated Provider Care Teams.  These Care Teams include your primary Cardiologist (physician) and Advanced Practice Providers (APPs -  Physician Assistants and Nurse Practitioners) who all work together to provide you with the care you need, when you need it.  We recommend signing up for the patient portal called "MyChart".  Sign up  information is provided on this After Visit Summary.  MyChart is used to connect with patients for Virtual Visits (Telemedicine).  Patients are able to view lab/test results, encounter notes, upcoming appointments, etc.  Non-urgent messages can be sent to your provider as well.   To learn more about what you can do with MyChart, go to [email or web address].    Your next appointment:   12 month(s)  The format for your next appointment:   In Person  Provider:   You may see Evalina Field, MD or one of the following Advanced Practice Providers on your designated Care Team:   Almyra Deforest, PA-C Sande Rives, [place]    Time Spent with Patient: I have spent a total of 25 minutes with patient reviewing hospital notes, telemetry, EKGs, labs and examining the patient as well as establishing an assessment and plan that was discussed with the patient.  > 50% of time was spent in direct patient care.  Signed, Addison Naegeli. Audie Box, MD, [place]  [address] [place], [place] [address] [phone]  12/22/2020 12:52 PM

## 2020-12-22 ENCOUNTER — Other Ambulatory Visit: Payer: Self-pay

## 2020-12-22 ENCOUNTER — Ambulatory Visit (INDEPENDENT_AMBULATORY_CARE_PROVIDER_SITE_OTHER): Payer: Medicare Other | Admitting: Cardiovascular Disease

## 2020-12-22 ENCOUNTER — Encounter: Payer: Self-pay | Admitting: Cardiovascular Disease

## 2020-12-22 VITALS — BP 120/50 | HR 60 | Ht 65.0 in | Wt 169.6 lb

## 2020-12-22 DIAGNOSIS — I10 Essential (primary) hypertension: Secondary | ICD-10-CM

## 2020-12-22 DIAGNOSIS — E782 Mixed hyperlipidemia: Secondary | ICD-10-CM

## 2020-12-22 DIAGNOSIS — I5032 Chronic diastolic (congestive) heart failure: Secondary | ICD-10-CM

## 2020-12-22 NOTE — Patient Instructions (Signed)
Medication Instructions:  The current medical regimen is effective;  continue present plan and medications.  *If you need a refill on your cardiac medications before your next appointment, please call your pharmacy*   Follow-Up: At [facility], you and your health needs are our priority.  As part of our continuing mission to provide you with exceptional heart care, we have created designated Provider Care Teams.  These Care Teams include your primary Cardiologist (physician) and Advanced Practice Providers (APPs -  Physician Assistants and Nurse Practitioners) who all work together to provide you with the care you need, when you need it.  We recommend signing up for the patient portal called "MyChart".  Sign up information is provided on this After Visit Summary.  MyChart is used to connect with patients for Virtual Visits (Telemedicine).  Patients are able to view lab/test results, encounter notes, upcoming appointments, etc.  Non-urgent messages can be sent to your provider as well.   To learn more about what you can do with MyChart, go to [email or web address].    Your next appointment:   12 month(s)  The format for your next appointment:   In Person  Provider:   You may see Evalina Field, MD or one of the following Advanced Practice Providers on your designated Care Team:   Almyra Deforest, PA-C Sande Rives, [place]

## 2020-12-23 ENCOUNTER — Encounter (HOSPITAL_COMMUNITY): Payer: Self-pay

## 2020-12-23 ENCOUNTER — Other Ambulatory Visit: Payer: Self-pay

## 2020-12-23 ENCOUNTER — Inpatient Hospital Stay: Payer: Medicare Other | Attending: Hematology

## 2020-12-23 ENCOUNTER — Emergency Department (HOSPITAL_COMMUNITY)
Admission: EM | Admit: 2020-12-23 | Discharge: 2020-12-23 | Disposition: A | Discharge status: Home / Self Care | Payer: Medicare Other | Attending: Physician Assistant | Admitting: Physician Assistant

## 2020-12-23 ENCOUNTER — Ambulatory Visit (HOSPITAL_COMMUNITY)
Admission: RE | Admit: 2020-12-23 | Discharge: 2020-12-23 | Disposition: A | Discharge status: Home / Self Care | Payer: Medicare Other | Source: Ambulatory Visit | Attending: Hematology | Admitting: Hematology

## 2020-12-23 DIAGNOSIS — Q2733 Arteriovenous malformation of digestive system vessel: Secondary | ICD-10-CM | POA: Insufficient documentation

## 2020-12-23 DIAGNOSIS — J441 Chronic obstructive pulmonary disease with (acute) exacerbation: Secondary | ICD-10-CM | POA: Insufficient documentation

## 2020-12-23 DIAGNOSIS — F17210 Nicotine dependence, cigarettes, uncomplicated: Secondary | ICD-10-CM | POA: Diagnosis not present

## 2020-12-23 DIAGNOSIS — R911 Solitary pulmonary nodule: Secondary | ICD-10-CM | POA: Insufficient documentation

## 2020-12-23 DIAGNOSIS — N1832 Chronic kidney disease, stage 3b: Secondary | ICD-10-CM | POA: Insufficient documentation

## 2020-12-23 DIAGNOSIS — Z794 Long term (current) use of insulin: Secondary | ICD-10-CM | POA: Diagnosis not present

## 2020-12-23 DIAGNOSIS — T8249XA Other complication of vascular dialysis catheter, initial encounter: Secondary | ICD-10-CM | POA: Diagnosis not present

## 2020-12-23 DIAGNOSIS — Z7982 Long term (current) use of aspirin: Secondary | ICD-10-CM | POA: Insufficient documentation

## 2020-12-23 DIAGNOSIS — Z789 Other specified health status: Secondary | ICD-10-CM

## 2020-12-23 DIAGNOSIS — E039 Hypothyroidism, unspecified: Secondary | ICD-10-CM | POA: Insufficient documentation

## 2020-12-23 DIAGNOSIS — Y829 Unspecified medical devices associated with adverse incidents: Secondary | ICD-10-CM | POA: Diagnosis not present

## 2020-12-23 DIAGNOSIS — Z7902 Long term (current) use of antithrombotics/antiplatelets: Secondary | ICD-10-CM | POA: Diagnosis not present

## 2020-12-23 DIAGNOSIS — Z79899 Other long term (current) drug therapy: Secondary | ICD-10-CM | POA: Insufficient documentation

## 2020-12-23 DIAGNOSIS — R918 Other nonspecific abnormal finding of lung field: Secondary | ICD-10-CM | POA: Insufficient documentation

## 2020-12-23 DIAGNOSIS — N183 Chronic kidney disease, stage 3 unspecified: Secondary | ICD-10-CM | POA: Insufficient documentation

## 2020-12-23 DIAGNOSIS — I130 Hypertensive heart and chronic kidney disease with heart failure and stage 1 through stage 4 chronic kidney disease, or unspecified chronic kidney disease: Secondary | ICD-10-CM | POA: Diagnosis not present

## 2020-12-23 DIAGNOSIS — E1122 Type 2 diabetes mellitus with diabetic chronic kidney disease: Secondary | ICD-10-CM | POA: Insufficient documentation

## 2020-12-23 DIAGNOSIS — N6310 Unspecified lump in the right breast, unspecified quadrant: Secondary | ICD-10-CM | POA: Insufficient documentation

## 2020-12-23 DIAGNOSIS — K5520 Angiodysplasia of colon without hemorrhage: Secondary | ICD-10-CM

## 2020-12-23 DIAGNOSIS — M549 Dorsalgia, unspecified: Secondary | ICD-10-CM | POA: Insufficient documentation

## 2020-12-23 DIAGNOSIS — Z95828 Presence of other vascular implants and grafts: Secondary | ICD-10-CM

## 2020-12-23 DIAGNOSIS — D649 Anemia, unspecified: Secondary | ICD-10-CM

## 2020-12-23 DIAGNOSIS — G8929 Other chronic pain: Secondary | ICD-10-CM | POA: Insufficient documentation

## 2020-12-23 DIAGNOSIS — D500 Iron deficiency anemia secondary to blood loss (chronic): Secondary | ICD-10-CM | POA: Insufficient documentation

## 2020-12-23 DIAGNOSIS — I129 Hypertensive chronic kidney disease with stage 1 through stage 4 chronic kidney disease, or unspecified chronic kidney disease: Secondary | ICD-10-CM | POA: Insufficient documentation

## 2020-12-23 DIAGNOSIS — I5033 Acute on chronic diastolic (congestive) heart failure: Secondary | ICD-10-CM | POA: Diagnosis not present

## 2020-12-23 LAB — FERRITIN: Ferritin: 149 ng/mL (ref 11–307)

## 2020-12-23 LAB — CBC WITH DIFFERENTIAL (CANCER CENTER ONLY)
Abs Immature Granulocytes: 0.02 10*3/uL (ref 0.00–0.07)
Basophils Absolute: 0 10*3/uL (ref 0.0–0.1)
Basophils Relative: 0 %
Eosinophils Absolute: 0.2 10*3/uL (ref 0.0–0.5)
Eosinophils Relative: 2 %
HCT: 32.8 % — ABNORMAL LOW (ref 36.0–46.0)
Hemoglobin: 9.6 g/dL — ABNORMAL LOW (ref 12.0–15.0)
Immature Granulocytes: 0 %
Lymphocytes Relative: 18 %
Lymphs Abs: 1.4 10*3/uL (ref 0.7–4.0)
MCH: 23.6 pg — ABNORMAL LOW (ref 26.0–34.0)
MCHC: 29.3 g/dL — ABNORMAL LOW (ref 30.0–36.0)
MCV: 80.8 fL (ref 80.0–100.0)
Monocytes Absolute: 0.7 10*3/uL (ref 0.1–1.0)
Monocytes Relative: 9 %
Neutro Abs: 5.5 10*3/uL (ref 1.7–7.7)
Neutrophils Relative %: 71 %
Platelet Count: 233 10*3/uL (ref 150–400)
RBC: 4.06 MIL/uL (ref 3.87–5.11)
RDW: 18.5 % — ABNORMAL HIGH (ref 11.5–15.5)
WBC Count: 7.9 10*3/uL (ref 4.0–10.5)
nRBC: 0 % (ref 0.0–0.2)

## 2020-12-23 LAB — CMP (CANCER CENTER ONLY)
ALT: 39 U/L (ref 0–44)
AST: 21 U/L (ref 15–41)
Albumin: 3.6 g/dL (ref 3.5–5.0)
Alkaline Phosphatase: 165 U/L — ABNORMAL HIGH (ref 38–126)
Anion gap: 10 (ref 5–15)
BUN: 28 mg/dL — ABNORMAL HIGH (ref 8–23)
CO2: 22 mmol/L (ref 22–32)
Calcium: 10.7 mg/dL — ABNORMAL HIGH (ref 8.9–10.3)
Chloride: 107 mmol/L (ref 98–111)
Creatinine: 1.53 mg/dL — ABNORMAL HIGH (ref 0.44–1.00)
GFR, Estimated: 36 mL/min — ABNORMAL LOW (ref >60)
Glucose, Bld: 146 mg/dL — ABNORMAL HIGH (ref 70–99)
Potassium: 4.5 mmol/L (ref 3.5–5.1)
Sodium: 139 mmol/L (ref 135–145)
Total Bilirubin: 0.2 mg/dL — ABNORMAL LOW (ref 0.3–1.2)
Total Protein: 6.7 g/dL (ref 6.5–8.1)

## 2020-12-23 LAB — IRON AND TIBC
Iron: 76 ug/dL (ref 41–142)
Saturation Ratios: 24 % (ref 21–57)
TIBC: 313 ug/dL (ref 236–444)
UIBC: 236 ug/dL (ref 120–384)

## 2020-12-23 LAB — SAMPLE TO BLOOD BANK

## 2020-12-23 MED ORDER — HEPARIN SOD (PORK) LOCK FLUSH 100 UNIT/ML IV SOLN
500.0000 [IU] | INTRAVENOUS | Status: AC | PRN
  Administered 2020-12-23: 500 [IU]
  Filled 2020-12-23: qty 5

## 2020-12-23 MED ORDER — LIDOCAINE-PRILOCAINE 2.5-2.5 % EX CREA: 1.0000 "application " | TOPICAL_CREAM | CUTANEOUS | 1 refills | Status: DC | PRN

## 2020-12-23 MED ORDER — SODIUM CHLORIDE 0.9% FLUSH
10.0000 mL | Freq: Once | INTRAVENOUS | Status: AC
  Administered 2020-12-23: 10 mL
  Filled 2020-12-23: qty 10

## 2020-12-23 NOTE — Discharge Instructions (Addendum)
Please follow up with your primary care provider within 5-7 days for re-evaluation of your symptoms. If you do not have a primary care provider, information for a healthcare clinic has been provided for you to make arrangements for follow up care. Please return to the emergency department for any new or worsening symptoms. ° °

## 2020-12-23 NOTE — ED Provider Notes (Signed)
[facility] EMERGENCY DEPARTMENT Provider Note   CSN: [id number] Arrival date & time: 12/23/20  1919     History No chief complaint on file.   Bethany [name] is a 70 y.o. female.  HPI  70 year old female with a history of acute renal failure, anemia, arthritis, COPD, diabetes, GERD, hypercalcemia, hyperlipidemia, hypertension, pneumonia, renal disorder, CVA, who presents to the emergency department today for evaluation of a problem with her port.  Patient had a CT scan earlier today with contrast.  Her port was accessed at this time and was never de accessed following completion of the imaging study.  She is presenting here to have her port to be accessed and we flushed.  There are no other complaints at this time.  Past Medical History:  Diagnosis Date   Acute renal failure (HCC)    Anemia    Arthritis    COPD (chronic obstructive pulmonary disease) (HCC)    Diabetes mellitus without complication ([place])    type II    GERD (gastroesophageal reflux disease)    Hypercalcemia    Hyperlipidemia    Hypertension    Pneumonia    Renal disorder    Single kidney    Stroke [facility])    July 2021   Thrombosis    Tobacco abuse     Patient Active Problem List   Diagnosis Date Noted   Multiple lung nodules 07/31/2020   Brain aneurysm 05/04/2020   Melena 12/30/2019   CVA (cerebral vascular accident) ([place]) 11/19/2019   Acute on chronic diastolic CHF (congestive heart failure) ([place])    Acute respiratory failure with hypoxia ([place]) 03/27/2019   Fever 03/27/2019   Shortness of breath 03/27/2019   Hypothyroidism 03/27/2019   Chronic GI bleeding 03/27/2019   Pleural effusion on right 03/27/2019   Acute renal failure superimposed on stage 3b chronic kidney disease ([place]) 03/27/2019   Essential hypertension 03/27/2019   GERD (gastroesophageal reflux disease) 03/27/2019   Acute respiratory failure ([place]) 03/27/2019   Breast mass, right 06/08/2017   Liver mass 06/08/2017    Chronic kidney disease (CKD), stage III (moderate) 03/13/2017   Chronic headaches 03/13/2017   Depression 03/13/2017   Diabetes mellitus type 2 with neurological manifestations ([place]) 03/13/2017   History of thrombosis 03/13/2017   Hyperlipemia, mixed 03/13/2017   Community acquired pneumonia 03/13/2017   COPD exacerbation ([place]) 03/13/2017   Pneumonia 03/13/2017   Iron deficiency anemia secondary to blood loss (chronic) 03/07/2017   GI AVM (gastrointestinal arteriovenous vascular malformation) 03/07/2017   History of colonic polyps 10/05/2016    Past Surgical History:  Procedure Laterality Date   ABDOMINAL HYSTERECTOMY     blood clots removed from descending aorta      CHOLECYSTECTOMY     COLONOSCOPY     COLONOSCOPY WITH PROPOFOL N/A 04/17/2017   Procedure: COLONOSCOPY WITH PROPOFOL;  Surgeon: Wilford Corner, MD;  Location: [facility] ENDOSCOPY;  Service: Endoscopy;  Laterality: N/A;   ESOPHAGOGASTRODUODENOSCOPY (EGD) WITH PROPOFOL N/A 04/17/2017   Procedure: ESOPHAGOGASTRODUODENOSCOPY (EGD) WITH PROPOFOL;  Surgeon: Wilford Corner, MD;  Location: [facility] ENDOSCOPY;  Service: Endoscopy;  Laterality: N/A;   IR 3D INDEPENDENT WKST  05/04/2020   IR ANGIO INTRA EXTRACRAN SEL COM CAROTID INNOMINATE BILAT MOD SED  02/06/2020   IR ANGIO INTRA EXTRACRAN SEL INTERNAL CAROTID UNI L MOD SED  05/04/2020   IR ANGIO VERTEBRAL SEL VERTEBRAL BILAT MOD SED  02/06/2020   IR ANGIOGRAM FOLLOW UP STUDY  05/04/2020   IR IMAGING GUIDED PORT INSERTION  05/01/2019   IR NEURO EACH ADD'L AFTER BASIC UNI LEFT (MS)  05/04/2020   IR RADIOLOGIST EVAL & MGMT  05/28/2020   IR TRANSCATH/EMBOLIZ  05/04/2020   IR US GUIDE VASC ACCESS RIGHT  02/06/2020   IR US GUIDE VASC ACCESS RIGHT  05/04/2020   NEPHRECTOMY RECIPIENT     RADIOLOGY WITH ANESTHESIA N/A 05/04/2020   Procedure: Sheppard Plumber;  Surgeon: Luanne Bras, MD;  Location: [place];  Service: Radiology;  Laterality: N/A;     OB History   No obstetric history on  file.     Family History  Problem Relation Age of Onset   Hypertension Mother    Diabetes Father    Hypertension Brother    Breast cancer Maternal Aunt        89s   Breast cancer Maternal Aunt 75       colon cancer    Social History   Tobacco Use   Smoking status: Every Day    Packs/day: 0.25    Years: 40.00    Pack years: 10.00    Types: Cigarettes   Smokeless tobacco: Never  Vaping Use   Vaping Use: Never used  Substance Use Topics   Alcohol use: Yes    Comment: socially   Drug use: No    Home Medications Prior to Admission medications   Medication Sig Start Date End Date Taking? Authorizing Provider  allopurinol (ZYLOPRIM) 100 MG tablet Take 100 mg by mouth at bedtime.    [provider]  amLODipine (NORVASC) 10 MG tablet Take 10 mg by mouth daily.    [provider]  APPLE CIDER VINEGAR PO Take 1 tablet by mouth daily. Patient not taking: Reported on 12/22/2020    [provider]  aspirin EC 81 MG EC tablet Take 1 tablet (81 mg total) by mouth daily. Swallow whole. 11/22/19   Danford, Suann Larry, MD  atorvastatin (LIPITOR) 80 MG tablet Take 1 tablet (80 mg total) by mouth daily. 11/22/19   Danford, Suann Larry, MD  baclofen (LIORESAL) 10 MG tablet Take 10 mg by mouth 3 (three) times daily as needed for muscle spasms.    [provider]  buprenorphine Haze Rushing) 20 MCG/HR PTWK Place 1 patch onto the skin every Monday.  Patient not taking: Reported on 12/22/2020 11/15/19   [provider]  clindamycin (CLEOCIN) 300 MG capsule Take 300 mg by mouth 3 (three) times daily. Patient not taking: Reported on 12/22/2020 11/09/20   [provider]  clopidogrel (PLAVIX) 75 MG tablet Take 75 mg by mouth daily.    [provider]  Dulaglutide (TRULICITY) 1.5 NW/2.9FA SOPN Inject 1.5 mg into the skin every Monday.     [provider]  ezetimibe (ZETIA) 10 MG tablet Take 10 mg by mouth daily.    [provider]   feeding supplement, ENSURE ENLIVE, (ENSURE ENLIVE) LIQD Take 237 mLs by mouth 2 (two) times daily between meals. 03/30/19   Dana Allan I, MD  furosemide (LASIX) 40 MG tablet Take 40 mg by mouth as needed. Patient not taking: Reported on 12/22/2020 04/08/20   [provider]  gabapentin (NEURONTIN) 100 MG capsule Take 2 capsules (200 mg total) by mouth 4 (four) times daily. 05/11/20   Louk, Bea Graff, PA-C  insulin glargine, 1 Unit Dial, (TOUJEO SOLOSTAR) 300 UNIT/ML Solostar Pen Inject 20 Units into the skin at bedtime.     [provider]  insulin lispro (HUMALOG) 100 UNIT/ML KwikPen INJECT 5 UNITS INTO SKIN  BEFORE EACH MEAL WHEN BLOOD SUGAR IS ABOVE 200 11/09/20   [provider]  Ipratropium-Albuterol (COMBIVENT) 20-100 MCG/ACT AERS respimat Inhale 1 puff into the lungs every 6 (six) hours as needed for wheezing or shortness of breath. 03/17/17   Hongalgi, Lenis Dickinson, MD  levothyroxine (SYNTHROID) 75 MCG tablet Take 75 mcg by mouth daily. 02/28/19   [provider]  lidocaine-prilocaine (EMLA) cream Apply 1 application topically as needed. 12/23/20   Truitt Merle, MD  losartan (COZAAR) 25 MG tablet Take 1 tablet (25 mg total) by mouth daily. 11/22/19 11/21/20  Danford, Suann Larry, MD  metoprolol (LOPRESSOR) 50 MG tablet Take 50 mg by mouth 2 (two) times daily.  03/06/14   [provider]  Multiple Vitamin (MULTIVITAMIN WITH MINERALS) TABS tablet Take 1 tablet by mouth daily.    [provider]  NARCAN 4 MG/0.1ML LIQD nasal spray kit Place 1 spray into the nose as needed for opioid reversal. Patient not taking: Reported on 12/22/2020 08/22/19   [provider]  oxyCODONE-acetaminophen (PERCOCET) 10-325 MG tablet Take 1 tablet by mouth every 6 (six) hours as needed (breakthrough pain).     [provider]  pantoprazole (PROTONIX) 40 MG tablet Take 40 mg by mouth every morning.    [provider]  pregabalin (LYRICA) 25 MG  capsule Take 25 mg by mouth 2 (two) times daily.  01/24/19   [provider]  prochlorperazine (COMPAZINE) 5 MG tablet Take 1 tablet (5 mg total) by mouth every 6 (six) hours as needed for nausea or vomiting. 09/28/20   Truitt Merle, MD  sennosides-docusate sodium (SENOKOT-S) 8.6-50 MG tablet Take 1 tablet by mouth at bedtime.    [provider]  TRUEPLUS PEN NEEDLES 32G X 4 MM MISC  10/06/20   [provider]  Vitamin D, Ergocalciferol, (DRISDOL) 1.25 MG (50000 UNIT) CAPS capsule Take 50,000 Units by mouth every Monday.     [provider]  XTAMPZA ER 27 MG C12A Take 27 mg by mouth 2 (two) times daily.  02/27/19   [provider]    Allergies    Contrast media [iodinated diagnostic agents] and Other  Review of Systems   Review of Systems  Constitutional:  Negative for fever.   Physical Exam Updated Vital Signs BP (!) 122/48 (BP Location: Left Arm)   Pulse (!) 57   Temp 98.4 F (36.9 C) (Oral)   Resp 16   Ht '5\' 5"'  (1.651 m)   Wt 74.4 kg   SpO2 96%   BMI 27.29 kg/m   Physical Exam Vitals and nursing note reviewed.  Constitutional:      General: She is not in acute distress.    Appearance: She is well-developed.  HENT:     Head: Normocephalic and atraumatic.  Eyes:     Conjunctiva/sclera: Conjunctivae normal.  Cardiovascular:     Rate and Rhythm: Normal rate.  Pulmonary:     Effort: Pulmonary effort is normal.     Comments: No erythema, warmth or tenderness around the port on the right upper chest. Musculoskeletal:        General: Normal range of motion.     Cervical back: Neck supple.  Skin:    General: Skin is warm and dry.  Neurological:     Mental Status: She is alert.    ED Results / Procedures / Treatments   Labs (all labs ordered are listed, but only abnormal results are displayed) Labs Reviewed - No data to display  EKG None  Radiology No results found.  Procedures Procedures   Medications Ordered in  ED Medications  heparin lock flush 100 unit/mL (500 Units Intracatheter Given 12/23/20 1955)    ED Course  I have reviewed the triage vital signs and the nursing notes.  Pertinent labs & imaging results that were available during my care of the patient were reviewed by me and considered in my medical decision making (see chart for details).    MDM Rules/Calculators/A&P                          70 year old female presenting to the emergency department today for evaluation of a vascular access problem.  Had her port accessed earlier today and it was never the accessed when she left her procedure.  She presents today to have the port Deaccessed.  IV team was consulted and port was successfully de accessed.  She does not have any significant tenderness, redness or warmth surrounding the port.  She is discharged in stable condition and advised to follow-up and return if worse.   Final Clinical Impression(s) / ED Diagnoses Final diagnoses:  Problem with vascular access    Rx / DC Orders ED Discharge Orders     None        Rodney Booze, PA-C 12/23/20 2007    Wynona Dove A, DO 12/24/20 [id number]

## 2020-12-23 NOTE — ED Provider Notes (Signed)
Emergency Medicine Provider Triage Evaluation Note  Bethany [name] , a 70 y.o. female  was evaluated in triage.  Pt complains of a problem with her port. States she had a ct scan completed this morning with contrast through her port. States her port was not deaccessed after she left. She is having some pain at the site.  Review of Systems  Positive: Problem with port Negative: fever  Physical Exam  There were no vitals taken for this visit. Gen:   Awake, no distress   Resp:  Normal effort  MSK:   Moves extremities without difficulty  Other:  Port to right chest  Medical Decision Making  Medically screening exam initiated at 7:41 PM.  Appropriate orders placed.  Bethany [name] was informed that the remainder of the evaluation will be completed by another provider, this initial triage assessment does not replace that evaluation, and the importance of remaining in the ED until their evaluation is complete.     Rodney Booze, PA-C 12/23/20 1942    Jeanell Sparrow, DO 12/24/20 0025

## 2020-12-23 NOTE — ED Triage Notes (Signed)
Pain from port area and needs to be de accessed.

## 2020-12-29 ENCOUNTER — Encounter: Payer: Self-pay | Admitting: Hematology

## 2020-12-29 ENCOUNTER — Other Ambulatory Visit: Payer: Self-pay

## 2020-12-29 ENCOUNTER — Inpatient Hospital Stay (HOSPITAL_BASED_OUTPATIENT_CLINIC_OR_DEPARTMENT_OTHER): Payer: Medicare Other | Admitting: Hematology

## 2020-12-29 VITALS — BP 128/48 | HR 65 | Temp 98.6°F | Resp 19 | Ht 65.0 in | Wt 170.1 lb

## 2020-12-29 DIAGNOSIS — G8929 Other chronic pain: Secondary | ICD-10-CM | POA: Diagnosis not present

## 2020-12-29 DIAGNOSIS — M549 Dorsalgia, unspecified: Secondary | ICD-10-CM | POA: Diagnosis not present

## 2020-12-29 DIAGNOSIS — R911 Solitary pulmonary nodule: Secondary | ICD-10-CM | POA: Diagnosis not present

## 2020-12-29 DIAGNOSIS — Q2733 Arteriovenous malformation of digestive system vessel: Secondary | ICD-10-CM | POA: Diagnosis present

## 2020-12-29 DIAGNOSIS — N6310 Unspecified lump in the right breast, unspecified quadrant: Secondary | ICD-10-CM | POA: Diagnosis not present

## 2020-12-29 DIAGNOSIS — R918 Other nonspecific abnormal finding of lung field: Secondary | ICD-10-CM | POA: Diagnosis not present

## 2020-12-29 DIAGNOSIS — E1122 Type 2 diabetes mellitus with diabetic chronic kidney disease: Secondary | ICD-10-CM | POA: Diagnosis not present

## 2020-12-29 DIAGNOSIS — N183 Chronic kidney disease, stage 3 unspecified: Secondary | ICD-10-CM | POA: Diagnosis not present

## 2020-12-29 DIAGNOSIS — I129 Hypertensive chronic kidney disease with stage 1 through stage 4 chronic kidney disease, or unspecified chronic kidney disease: Secondary | ICD-10-CM | POA: Diagnosis not present

## 2020-12-29 DIAGNOSIS — D500 Iron deficiency anemia secondary to blood loss (chronic): Secondary | ICD-10-CM | POA: Diagnosis present

## 2020-12-29 NOTE — Progress Notes (Signed)
[place]   [phone] [phone]   Clinic Follow up Note   Patient Care Team: Arthur Holms, NP as PCP - General (Nurse Practitioner) Audie Box, Cassie Freer, MD as PCP - Cardiology (Cardiology) Truitt Merle, MD as Consulting Physician (Hematology) Angelia Mould, MD as Consulting Physician (Vascular Surgery) Koleen Distance, MD as Referring Physician (Gastroenterology)  Date of Service:  12/29/2020  CHIEF COMPLAINT: f/u of lab and CT scan result s   CURRENT THERAPY:  IV Feraheme as needed (if ferritin <100). Changed to IV Venofer on 01/02/19. Due to recent GI bleeding, we increased Venofer to every 2 weeks on 05/22/20. no need for IV iron since 12/02/20   INTERVAL HISTORY: Bethany [name] is here for a follow up and discuss her CT scan findings. She is clinically doing well, denies any diarrhea or black stool, or hematochezia.  She did have episode of nausea and vomiting 2 days ago, without any triggering factors.  This resolved spontaneously in the late afternoon.  She had a similar episodes in the past, not sure why it happens.  She has constipation sometimes, and the watery stools sometimes.  No other new complaints.  All other systems were reviewed with the patient and are negative.  MEDICAL HISTORY:  Past Medical History:  Diagnosis Date   Acute renal failure (HCC)    Anemia    Arthritis    COPD (chronic obstructive pulmonary disease) (HCC)    Diabetes mellitus without complication (HCC)    type II    GERD (gastroesophageal reflux disease)    Hypercalcemia    Hyperlipidemia    Hypertension    Pneumonia    Renal disorder    Single kidney    Stroke [facility])    July 2021   Thrombosis    Tobacco abuse     SURGICAL HISTORY: Past Surgical History:  Procedure Laterality Date   ABDOMINAL HYSTERECTOMY     blood clots removed from descending aorta      CHOLECYSTECTOMY     COLONOSCOPY     COLONOSCOPY WITH PROPOFOL N/A 04/17/2017    Procedure: COLONOSCOPY WITH PROPOFOL;  Surgeon: Wilford Corner, MD;  Location: [facility];  Service: Endoscopy;  Laterality: N/A;   ESOPHAGOGASTRODUODENOSCOPY (EGD) WITH PROPOFOL N/A 04/17/2017   Procedure: ESOPHAGOGASTRODUODENOSCOPY (EGD) WITH PROPOFOL;  Surgeon: Wilford Corner, MD;  Location: [facility];  Service: Endoscopy;  Laterality: N/A;   IR 3D INDEPENDENT WKST  05/04/2020   IR ANGIO INTRA EXTRACRAN SEL COM CAROTID INNOMINATE BILAT MOD SED  02/06/2020   IR ANGIO INTRA EXTRACRAN SEL INTERNAL CAROTID UNI L MOD SED  05/04/2020   IR ANGIO VERTEBRAL SEL VERTEBRAL BILAT MOD SED  02/06/2020   IR ANGIOGRAM FOLLOW UP STUDY  05/04/2020   IR IMAGING GUIDED PORT INSERTION  05/01/2019   IR NEURO EACH ADD'L AFTER BASIC UNI LEFT (MS)  05/04/2020   IR RADIOLOGIST EVAL & MGMT  05/28/2020   IR TRANSCATH/EMBOLIZ  05/04/2020   IR US GUIDE VASC ACCESS RIGHT  02/06/2020   IR US GUIDE VASC ACCESS RIGHT  05/04/2020   NEPHRECTOMY RECIPIENT     RADIOLOGY WITH ANESTHESIA N/A 05/04/2020   Procedure: Sheppard Plumber;  Surgeon: Luanne Bras, MD;  Location: [facility];  Service: Radiology;  Laterality: N/A;    I have reviewed the social history and family history with the patient and they are unchanged from previous note.  ALLERGIES:  is allergic to contrast media [iodinated diagnostic agents] and other.  MEDICATIONS:  Current Outpatient Medications  Medication Sig Dispense Refill   allopurinol (ZYLOPRIM) 100 MG tablet Take 100 mg by mouth at bedtime.     amLODipine (NORVASC) 10 MG tablet Take 10 mg by mouth daily.     APPLE CIDER VINEGAR PO Take 1 tablet by mouth daily. (Patient not taking: Reported on 12/22/2020)     aspirin EC 81 MG EC tablet Take 1 tablet (81 mg total) by mouth daily. Swallow whole. 30 tablet 11   atorvastatin (LIPITOR) 80 MG tablet Take 1 tablet (80 mg total) by mouth daily. 30 tablet 11   baclofen (LIORESAL) 10 MG tablet Take 10 mg by mouth 3 (three) times daily as needed for muscle  spasms.     buprenorphine (BUTRANS) 20 MCG/HR PTWK Place 1 patch onto the skin every Monday.  (Patient not taking: Reported on 12/22/2020)     clindamycin (CLEOCIN) 300 MG capsule Take 300 mg by mouth 3 (three) times daily. (Patient not taking: Reported on 12/22/2020)     clopidogrel (PLAVIX) 75 MG tablet Take 75 mg by mouth daily.     Dulaglutide (TRULICITY) 1.5 WJ/1.9JY SOPN Inject 1.5 mg into the skin every Monday.      ezetimibe (ZETIA) 10 MG tablet Take 10 mg by mouth daily.     feeding supplement, ENSURE ENLIVE, (ENSURE ENLIVE) LIQD Take 237 mLs by mouth 2 (two) times daily between meals. 237 mL 12   furosemide (LASIX) 40 MG tablet Take 40 mg by mouth as needed. (Patient not taking: Reported on 12/22/2020)     gabapentin (NEURONTIN) 100 MG capsule Take 2 capsules (200 mg total) by mouth 4 (four) times daily. 240 capsule 3   insulin glargine, 1 Unit Dial, (TOUJEO SOLOSTAR) 300 UNIT/ML Solostar Pen Inject 20 Units into the skin at bedtime.      insulin lispro (HUMALOG) 100 UNIT/ML KwikPen INJECT 5 UNITS INTO SKIN BEFORE EACH MEAL WHEN BLOOD SUGAR IS ABOVE 200     Ipratropium-Albuterol (COMBIVENT) 20-100 MCG/ACT AERS respimat Inhale 1 puff into the lungs every 6 (six) hours as needed for wheezing or shortness of breath. 1 Inhaler 0   levothyroxine (SYNTHROID) 75 MCG tablet Take 75 mcg by mouth daily.     lidocaine-prilocaine (EMLA) cream Apply 1 application topically as needed. 30 g 1   losartan (COZAAR) 25 MG tablet Take 1 tablet (25 mg total) by mouth daily. 30 tablet 11   metoprolol (LOPRESSOR) 50 MG tablet Take 50 mg by mouth 2 (two) times daily.      Multiple Vitamin (MULTIVITAMIN WITH MINERALS) TABS tablet Take 1 tablet by mouth daily.     NARCAN 4 MG/0.1ML LIQD nasal spray kit Place 1 spray into the nose as needed for opioid reversal. (Patient not taking: Reported on 12/22/2020)     oxyCODONE-acetaminophen (PERCOCET) 10-325 MG tablet Take 1 tablet by mouth every 6 (six) hours as needed  (breakthrough pain).      pantoprazole (PROTONIX) 40 MG tablet Take 40 mg by mouth every morning.     pregabalin (LYRICA) 25 MG capsule Take 25 mg by mouth 2 (two) times daily.      prochlorperazine (COMPAZINE) 5 MG tablet Take 1 tablet (5 mg total) by mouth every 6 (six) hours as needed for nausea or vomiting. 30 tablet 0   sennosides-docusate sodium (SENOKOT-S) 8.6-50 MG tablet Take 1 tablet by mouth at bedtime.     TRUEPLUS PEN NEEDLES 32G X 4 MM MISC      Vitamin D, Ergocalciferol, (DRISDOL) 1.25 MG (50000 UNIT) CAPS  capsule Take 50,000 Units by mouth every Monday.      XTAMPZA ER 27 MG C12A Take 27 mg by mouth 2 (two) times daily.      No current facility-administered medications for this visit.    PHYSICAL EXAMINATION: ECOG PERFORMANCE STATUS: 1 - Symptomatic but completely ambulatory  Vitals:   12/29/20 1420  BP: (!) 128/48  Pulse: 65  Resp: 19  Temp: 98.6 F (37 C)  SpO2: 100%   Filed Weights   12/29/20 1420  Weight: 170 lb 1.6 oz (77.2 kg)    GENERAL:alert, no distress and comfortable SKIN: skin color, texture, turgor are normal, no rashes or significant lesions EYES: normal, Conjunctiva are pink and non-injected, sclera clear  LYMPH:  no palpable lymphadenopathy in the cervical region. Nodule that is active bacterial infection in left armpit. NEURO: alert & oriented x 3 with fluent speech, no focal motor/sensory deficits  LABORATORY DATA:  I have reviewed the data as listed CBC Latest Ref Rng & Units 12/23/2020 12/02/2020 11/19/2020  WBC 4.0 - 10.5 K/uL 7.9 7.9 8.6  Hemoglobin 12.0 - 15.0 g/dL 9.6(L) 9.0(L) 8.3(L)  Hematocrit 36.0 - 46.0 % 32.8(L) 31.0(L) 28.5(L)  Platelets 150 - 400 K/uL 233 254 267     CMP Latest Ref Rng & Units 12/23/2020 12/02/2020 11/19/2020  Glucose 70 - 99 mg/dL 146(H) 226(H) 202(H)  BUN 8 - 23 mg/dL 28(H) 25(H) 31(H)  Creatinine 0.44 - 1.00 mg/dL 1.53(H) 1.15(H) 1.91(H)  Sodium 135 - 145 mmol/L 139 141 139  Potassium 3.5 - 5.1 mmol/L 4.5  4.7 4.5  Chloride 98 - 111 mmol/L 107 106 104  CO2 22 - 32 mmol/L '22 27 26  ' Calcium 8.9 - 10.3 mg/dL 10.7(H) 10.2 10.2  Total Protein 6.5 - 8.1 g/dL 6.7 6.8 6.6  Total Bilirubin 0.3 - 1.2 mg/dL 0.2(L) 0.2(L) 0.3  Alkaline Phos 38 - 126 U/L 165(H) 132(H) 136(H)  AST 15 - 41 U/L '21 21 18  ' ALT 0 - 44 U/L 39 26 16      RADIOGRAPHIC STUDIES: I have personally reviewed the radiological images as listed and agreed with the findings in the report. No results found.   ASSESSMENT & PLAN:  Bethany [name] is a 70 y.o. female with    1. Multiple lung nodules and breast mass  -For Lung cancer screening, her CT Chest from 07/12/19 shows Stable 7 mm nodule in left lower lobe. Also shows New 4 mm nodule is noted in left upper lobe and 12 mm sub solid density is noted in left lung apex.  -Her CT Chest from 07/13/20 showed Stable small pulmonary nodule in the left lower lobe. Scan also shows New bilateral pulmonary nodules including a dominant 1.3 cm pulmonary nodule in the left upper lobe. Findings are concerning for primary lung cancer or metastatic disease.  -Her PET from 07/28/20 shows Scattered pulmonary nodules, the dominant nodule in left lung showing mild hypermetabolic features. Findings may reflect metastatic disease. RIGHT breast mass with low level FDG uptake is nonspecific, also raising the question of neoplasm. -Pt notes h/o benign right breast fibroadenoma, biopsied in 2014, stable on 05/2017 mammogram. She also notes having liver lesion in [place] and Dr Hilarie Fredrickson is monitoring. Appeared benign on 01/2019 US abdomen. These were not seen on PET. -Biopsy of left lung was attempted by Dr Pascal Lux but held given difficult location. -She underwent right breast biopsy on 08/25/20 showing ductal papilloma with usual ductal hyperplasia.  She is scheduled to see breast surgeon  Dr. Ninfa Linden and her breast surgery in near future. -I reviewed her recent CT chest images from December 24, 2020 with patient in person  today, which showed waxing and waning of subcentimeter pulm pulmonary nodules, likely benign inflammatory process.  Previously hypermetabolic to left lung nodule is much smaller now.  He does have persistent 11 mm groundglass nodule in the left lung, no significant change in the past 7 to 8 months, will continue monitoring. -Given her history of smoking, COPD, I will refer her to pulmonary for follow-up her lung nodules and COPD management.   2. Iron deficient anemia, secondary to chronic blood loss from GI AVM -Pt has intestinal AVM history with associated chronic GI blood loss resulting in iron deficiency and blood loss anemia. She has required blood transfusions in the past in 2013 and in 2018 and frequent hospital admission for anemia. -She has intermittent epistaxis, but no skin or mucosa telangiectasia, no significant family history of bleeding disorder, she does not meet the diagnosis criteria for hereditary hemorrhagic telangiectasia. -Her last colonoscopy/endoscopy was 04/2017.  -She is not on oral iron, due to the lack of benefit.  -Currently being treated with IV Iron to prevent significant anemia with goal of Ferritin 100-200 range. She was switched to IV Venofer on 01/02/19 due to her insurance coverage. Increased to 48m with each infusion from 02/14/20. Due to recent GI bleeding, we increased Venofer to every 1-2 weeks on 05/22/20. Given severe anemia, Plavix has been held and continues Aspirin. -Her anemia and iron deficiency has much improved since she stopped her Plavix, last IV iron on December 02, 2020. -I reviewed her recent lab from December 23, 2020, which showed adequate iron level, stable mild anemia.   3. Smoking History, Smoking Cessation  -She has been smoking intermittently for 40 years. Then increased smoking daily after her mother passed in 2021.  -She has had lung nodules on Lung cancer screening CT. PET scan on 07/28/20 showed scattered pulmonary nodules with mild hypermetabolic  features. -She continues to smoke. I discussed complete smoking cessation. She is known to have COPD/Emphysema.    4. Chronic GI Bleeding, intermittent epistaxis, constipation, N&V -Has intestinal AVM history -Her colonoscopy/EGD by Dr. SMichail Sermonon 04/17/17 showed gastritis, diverticulosis and internal hemorrhoids, all benign with no obvious signs of bleeding at that time. She had repeat exams with Dr SRodena Pietyend of 2019. I will obtain report.  -She does not meet the diagnosis criteria for hereditary hemorrhagic telangiectasia (HHT)   -She has intermittent nausea and vomiting episodes, I encouraged her to follow-up with GI   5. H/o of arterial thrombosis, Ischemic stroke 11/2019, Left Brain Aneurysm (428mand 71m11m-S/p unilateral nephrectomy due to damage from arterial thrombosis -Managed by vascular physician, Dr. DixDoren Custardd neurologist Dr SetLeonie ManPrevious work-up for lupus anticoagulant and antiphospholipid syndrome were negative, normal protein S and C level. -She notes she still has mild speech deficit (difficulty finding her words). -On 02/06/20 Dr DevAllegra Granaund a second brain aneurysm. She underwent embolization surgery on 05/04/20  -After 04/2020 procedure she has restarted baby aspirin and Plavix until her next neuro f/u. Given increased GI bleeding and worsened anemia, we have held Plavix since 05/22/20. She is fine to continue baby aspirin.     6. COPD, HTN, CKD stage III, DM with Peripheral Neuropathy, Chronic Back Pain -She is currently on 600m70mbapentin 4 times a day.  -Continue to f/[facility] and [facility] short and long acting oxycodone. -  Advised pt that uncontrolled blood sugars can cause infections and swelling.  -Advised pt that her creatinine was worsened at the last visit. Encouraged drinking more water and limiting sodas.      PLAN: -Reviewed labs with patient, no need iv iron now. Will continue to monitor her anemia and iron level  -CT chest  reviewed, no concerns. -Pulmonary referral for lung nodule follow-up and COPD management -Lab and flush every 8 weeks, follow-up in 16 weeks -She knows to call me if she develops GI bleeding, or worsening fatigue   No problem-specific Assessment & Plan notes found for this encounter.   Orders Placed This Encounter  Procedures   Ambulatory referral to Pulmonology    Referral Priority:   Routine    Referral Type:   Consultation    Referral Reason:   Specialty Services Required    Requested Specialty:   Pulmonary Disease    Number of Visits Requested:   1     All questions were answered. The patient knows to call the clinic with any problems, questions or concerns. No barriers to learning was detected. The total time spent in the appointment was 30 minutes.     Truitt Merle, MD 12/29/2020

## 2021-01-06 ENCOUNTER — Inpatient Hospital Stay: Payer: Medicare Other

## 2021-01-08 ENCOUNTER — Ambulatory Visit: Payer: Medicare Other | Admitting: Hematology

## 2021-01-19 ENCOUNTER — Other Ambulatory Visit: Payer: Self-pay

## 2021-01-19 ENCOUNTER — Encounter (HOSPITAL_BASED_OUTPATIENT_CLINIC_OR_DEPARTMENT_OTHER): Payer: Self-pay | Admitting: Surgery

## 2021-01-19 ENCOUNTER — Other Ambulatory Visit: Payer: Medicare Other

## 2021-01-19 NOTE — Progress Notes (Signed)
Left message for Bethany [name] at Dr. Adela Ports office for how many days to hold plavix and asa. Did see cardiology, clearance was given, but no recommendation on medication hold time.

## 2021-01-21 ENCOUNTER — Encounter (HOSPITAL_BASED_OUTPATIENT_CLINIC_OR_DEPARTMENT_OTHER)
Admission: RE | Admit: 2021-01-21 | Discharge: 2021-01-21 | Disposition: A | Discharge status: Home / Self Care | Payer: Medicare Other | Source: Ambulatory Visit | Attending: Surgery | Admitting: Surgery

## 2021-01-21 ENCOUNTER — Telehealth: Payer: Self-pay

## 2021-01-21 DIAGNOSIS — Z01818 Encounter for other preprocedural examination: Secondary | ICD-10-CM | POA: Diagnosis present

## 2021-01-21 LAB — BASIC METABOLIC PANEL
Anion gap: 8 (ref 5–15)
BUN: 31 mg/dL — ABNORMAL HIGH (ref 8–23)
CO2: 24 mmol/L (ref 22–32)
Calcium: 9.6 mg/dL (ref 8.9–10.3)
Chloride: 105 mmol/L (ref 98–111)
Creatinine, Ser: 1.81 mg/dL — ABNORMAL HIGH (ref 0.44–1.00)
GFR, Estimated: 30 mL/min — ABNORMAL LOW (ref >60)
Glucose, Bld: 131 mg/dL — ABNORMAL HIGH (ref 70–99)
Potassium: 4.6 mmol/L (ref 3.5–5.1)
Sodium: 137 mmol/L (ref 135–145)

## 2021-01-21 MED ORDER — ENSURE PRE-SURGERY PO LIQD: 296.0000 mL | Freq: Once | ORAL | Status: DC

## 2021-01-21 NOTE — Telephone Encounter (Signed)
   [facility] Pre-operative Risk Assessment    Patient Name: Bethany [name]  DOB: 1951/03/03 MRN: [id number]  HEARTCARE STAFF:  - IMPORTANT!!!!!! Under Visit Info/Reason for Call, type in Other and utilize the format Clearance MM/DD/YY or Clearance TBD. Do not use dashes or single digits. - Please review there is not already an duplicate clearance open for this procedure. - If request is for dental extraction, please clarify the # of teeth to be extracted. - If the patient is currently at the dentist's office, call Pre-Op Callback Staff (MA/nurse) to input urgent request.  - If the patient is not currently in the dentist office, please route to the Pre-Op pool.  Request for surgical clearance:  What type of surgery is being performed? Right Breast Lumpectomy   When is this surgery scheduled? TBD  What type of clearance is required (medical clearance vs. Pharmacy clearance to hold med vs. Both)? Both   Are there any medications that need to be held prior to surgery and how long? ASA & Plavix   Practice name and name of physician performing surgery? [facility], Dr. Coralie Keens  What is the office phone number? [phone]   7.   What is the office fax number? [place], CMA  8.   Anesthesia type (None, local, MAC, general) ? General    Jacqulynn Cadet 01/21/2021, 11:29 AM  _________________________________________________________________   (provider comments below)

## 2021-01-21 NOTE — Progress Notes (Signed)

## 2021-01-21 NOTE — Telephone Encounter (Signed)
   Name: Bethany [name]  DOB: 1951-04-04  MRN: [id number]   Primary Cardiologist: Evalina Field, MD  Chart reviewed as part of pre-operative protocol coverage. Patient was contacted 01/21/2021 in reference to pre-operative risk assessment for pending surgery as outlined below.  Bethany [name] was last seen on 12/22/20 by Dr. Audie Box.  Since that day, Bethany [name] has done well. She does not have a history of ischemic heart disease. She can complete 4.0 METS without angina. She has been deemed not a candidate for invasive cardiac procedures.   She takes ASA for hx of stroke. She states she is no longer taking plavix. Will defer to neurology/PCP for ASA hold.  Therefore, based on ACC/AHA guidelines, the patient would be at acceptable risk for the planned procedure without further cardiovascular testing.   The patient was advised that if she develops new symptoms prior to surgery to contact our office to arrange for a follow-up visit, and she verbalized understanding.  I will route this recommendation to the requesting party via Epic fax function and remove from pre-op pool. Please call with questions.  [place], PA 01/21/2021, 2:18 PM

## 2021-01-22 ENCOUNTER — Other Ambulatory Visit: Payer: Self-pay

## 2021-01-22 ENCOUNTER — Ambulatory Visit
Admission: RE | Admit: 2021-01-22 | Discharge: 2021-01-22 | Disposition: A | Discharge status: Home / Self Care | Payer: Medicare Other | Source: Ambulatory Visit | Attending: Surgery | Admitting: Surgery

## 2021-01-22 DIAGNOSIS — D241 Benign neoplasm of right breast: Secondary | ICD-10-CM

## 2021-01-23 NOTE — H&P (Signed)
PROVIDER: Beverlee Nims, MD  MRN: [id number] DOB: 1951-05-05   Subjective   Chief Complaint: Breast Problem   History of Present Illness: Bethany [name] is a 70 y.o. female who is seen today as an office consultation for evaluation of Breast Problem .   She has here today for evaluation of a papilloma of the right breast. She actually has multiple abnormal appearing nodules in her lungs. She underwent a PET scan which was positive for these nodules but also showed an area of increased uptake in the right breast. She had mammograms and ultrasounds performed. Shown a biopsy of a suspicious area in the right breast in April of this year which was a papilloma. She underwent a second biopsy of another area in the right breast in June which was benign and probably showed a regressing fibroadenoma. The work-up of her lungs is ongoing. A right breast lumpectomy is recommended to remove the area of the papilloma to rule out malignancy especially in light of the increased activity on the PET scan. She has a family history of breast cancer in her sister. She denies nipple discharge. She has had no previous problems regarding her breast. She does complain of an abnormal skin lesion on her left abdominal wall which is pedunculated and occasionally bleeds. She currently denies shortness of breath.  Review of Systems: A complete review of systems was obtained from the patient. I have reviewed this information and discussed as appropriate with the patient. See HPI as well for other ROS.  Review of Systems  All other systems reviewed and are negative.   Medical History: Past Medical History:  Diagnosis Date   Arthritis   COPD (chronic obstructive pulmonary disease) (CMS-HCC)   DVT (deep vein thrombosis) in pregnancy   GERD (gastroesophageal reflux disease)   Hyperlipemia   Kidney disease   Liver disease   Stroke (cerebrum) (CMS-HCC)   Thyroid disorder   There is no problem list on file for  this patient.  Past Surgical History:  Procedure Laterality Date   Kidney removed Left 06/2011    Allergies  Allergen Reactions   Iodinated Contrast Media Unknown   Current Outpatient Medications on File Prior to Visit  Medication Sig Dispense Refill   aspirin 81 MG EC tablet Take by mouth   allopurinoL (ZYLOPRIM) 100 MG tablet   amLODIPine (NORVASC) 10 MG tablet   atorvastatin (LIPITOR) 80 MG tablet   baclofen (LIORESAL) 10 MG tablet   buprenorphine (BUTRANS) 20 mcg/hour APPLY 1 PATCH ON SKIN WEEKLY   clopidogreL (PLAVIX) 75 mg tablet   COMBIVENT RESPIMAT 20-100 mcg/actuation inhaler   ergocalciferol, vitamin D2, 1,250 mcg (50,000 unit) capsule Take 50,000 Units by mouth once a week   ezetimibe (ZETIA) 10 mg tablet   famotidine (PEPCID) 20 MG tablet   ferrous sulfate 325 (65 FE) MG EC tablet Take by mouth   FUROsemide (LASIX) 40 MG tablet   gabapentin (NEURONTIN) 600 MG tablet   HUMALOG KWIKPEN INSULIN pen injector (concentration 100 units/mL) INJECT 5 UNITS INTO SKIN BEFORE EACH MEAL WHEN BLOOD SUGAR IS ABOVE 200   losartan (COZAAR) 25 MG tablet   metoprolol tartrate (LOPRESSOR) 50 MG tablet   pantoprazole (PROTONIX) 40 MG DR tablet Take 40 mg by mouth every morning   sennosides-docusate (SENOKOT-S) 8.6-50 mg tablet Take 1 tablet by mouth nightly   TOUJEO SOLOSTAR U-300 INSULIN pen injector (concentration [id number] units/mL)   TRULICITY 1.5 99991111 mL subcutaneous injection   No current facility-administered medications on file  prior to visit.   Family History  Problem Relation Age of Onset   High blood pressure (Hypertension) Mother   Diabetes Father   Stroke Sister   Skin cancer Sister   High blood pressure (Hypertension) Sister   Diabetes Sister   Breast cancer Sister   Diabetes Brother   High blood pressure (Hypertension) Brother   Skin cancer Brother   Stroke Brother    Social History   Tobacco Use  Smoking Status Current Every Day Smoker   Packs/day: 0.50   Smokeless Tobacco Never Used    Social History   Socioeconomic History   Marital status: Single  Tobacco Use   Smoking status: Current Every Day Smoker  Packs/day: 0.50   Smokeless tobacco: Never Used  Vaping Use   Vaping Use: Never used  Substance and Sexual Activity   Alcohol use: Not Currently   Objective:   Vitals:  12/08/20 1118  BP: 120/60  Pulse: 57  Temp: 36.9 C (98.4 F)  SpO2: 97%  Weight: 74.8 kg (164 lb 12.8 oz)  Height: 165.1 cm ('5\' 5"'$ )   Body mass index is 27.42 kg/m.  Physical Exam   She appears well on exam  There are no palpable breast masses in either breast. There is no axillary adenopathy on either side. The nipple areolar complexes are normal  She does have an abnormal appearing skin lesion on the upper left abdominal wall which is pedunculated with a small stalk. There is no evidence of infection  Lungs clear  CV RRR  Neuro grossly intact  s.  Assessment and Plan:    Intraductal papilloma of right breast Abnormal skin lesions    I gave her a copy of the pathology results. A radioactive seed guided right breast lumpectomy is strongly recommended for complete evaluation of this area of the papilloma to rule out malignancy especially in light of the lung nodules. She completely understands and wished to proceed with surgery. She would also like me to remove the skin lesion on her abdominal wall as it will occasionally bleed. I suspect this is benign but given her history as well I recommend removal. We discussed the surgical procedure in detail. I discussed the risk which includes but is not limited to bleeding, infection, the need for further surgery of malignancy is found, cardiopulmonary issues, postoperative recovery, etc. She understands and wishes to proceed with surgery.

## 2021-01-25 ENCOUNTER — Ambulatory Visit (HOSPITAL_BASED_OUTPATIENT_CLINIC_OR_DEPARTMENT_OTHER): Payer: Medicare Other | Admitting: Anesthesiology

## 2021-01-25 ENCOUNTER — Ambulatory Visit (HOSPITAL_BASED_OUTPATIENT_CLINIC_OR_DEPARTMENT_OTHER)
Admission: RE | Admit: 2021-01-25 | Discharge: 2021-01-25 | Disposition: A | Discharge status: Home / Self Care | Payer: Medicare Other | Source: Ambulatory Visit | Attending: Surgery | Admitting: Surgery

## 2021-01-25 ENCOUNTER — Encounter (HOSPITAL_BASED_OUTPATIENT_CLINIC_OR_DEPARTMENT_OTHER): Payer: Self-pay | Admitting: Surgery

## 2021-01-25 ENCOUNTER — Other Ambulatory Visit: Payer: Self-pay

## 2021-01-25 ENCOUNTER — Ambulatory Visit
Admission: RE | Admit: 2021-01-25 | Discharge: 2021-01-25 | Disposition: A | Discharge status: Home / Self Care | Payer: Medicare Other | Source: Ambulatory Visit | Attending: Surgery | Admitting: Surgery

## 2021-01-25 ENCOUNTER — Encounter (HOSPITAL_BASED_OUTPATIENT_CLINIC_OR_DEPARTMENT_OTHER)
Admission: RE | Disposition: A | Discharge status: Home / Self Care | Payer: Self-pay | Source: Ambulatory Visit | Attending: Surgery

## 2021-01-25 DIAGNOSIS — Z803 Family history of malignant neoplasm of breast: Secondary | ICD-10-CM | POA: Insufficient documentation

## 2021-01-25 DIAGNOSIS — N6021 Fibroadenosis of right breast: Secondary | ICD-10-CM | POA: Diagnosis not present

## 2021-01-25 DIAGNOSIS — Z905 Acquired absence of kidney: Secondary | ICD-10-CM | POA: Insufficient documentation

## 2021-01-25 DIAGNOSIS — L918 Other hypertrophic disorders of the skin: Secondary | ICD-10-CM | POA: Diagnosis not present

## 2021-01-25 DIAGNOSIS — Z7902 Long term (current) use of antithrombotics/antiplatelets: Secondary | ICD-10-CM | POA: Diagnosis not present

## 2021-01-25 DIAGNOSIS — D241 Benign neoplasm of right breast: Secondary | ICD-10-CM

## 2021-01-25 DIAGNOSIS — N6310 Unspecified lump in the right breast, unspecified quadrant: Secondary | ICD-10-CM | POA: Diagnosis present

## 2021-01-25 DIAGNOSIS — N6489 Other specified disorders of breast: Secondary | ICD-10-CM | POA: Insufficient documentation

## 2021-01-25 DIAGNOSIS — Z79899 Other long term (current) drug therapy: Secondary | ICD-10-CM | POA: Diagnosis not present

## 2021-01-25 DIAGNOSIS — Z7982 Long term (current) use of aspirin: Secondary | ICD-10-CM | POA: Insufficient documentation

## 2021-01-25 DIAGNOSIS — Z794 Long term (current) use of insulin: Secondary | ICD-10-CM | POA: Diagnosis not present

## 2021-01-25 DIAGNOSIS — F17210 Nicotine dependence, cigarettes, uncomplicated: Secondary | ICD-10-CM | POA: Insufficient documentation

## 2021-01-25 HISTORY — PX: BREAST LUMPECTOMY WITH RADIOACTIVE SEED LOCALIZATION: SHX6424

## 2021-01-25 HISTORY — PX: BREAST LUMPECTOMY: SHX2

## 2021-01-25 HISTORY — PX: BREAST EXCISIONAL BIOPSY: SUR124

## 2021-01-25 HISTORY — PX: LESION REMOVAL: SHX5196

## 2021-01-25 LAB — GLUCOSE, CAPILLARY
Glucose-Capillary: 169 mg/dL — ABNORMAL HIGH (ref 70–99)
Glucose-Capillary: 157 mg/dL — ABNORMAL HIGH (ref 70–99)

## 2021-01-25 SURGERY — BREAST LUMPECTOMY WITH RADIOACTIVE SEED LOCALIZATION
Anesthesia: General | Site: Breast | Laterality: Right

## 2021-01-25 MED ORDER — DEXAMETHASONE SODIUM PHOSPHATE 10 MG/ML IJ SOLN
INTRAMUSCULAR | Status: DC | PRN
  Administered 2021-01-25: 4 mg via INTRAVENOUS

## 2021-01-25 MED ORDER — ONDANSETRON HCL 4 MG/2ML IJ SOLN
INTRAMUSCULAR | Status: DC | PRN
  Administered 2021-01-25: 4 mg via INTRAVENOUS

## 2021-01-25 MED ORDER — ACETAMINOPHEN 500 MG PO TABS
ORAL_TABLET | ORAL | Status: AC
  Filled 2021-01-25: qty 1

## 2021-01-25 MED ORDER — LACTATED RINGERS IV SOLN: INTRAVENOUS | Status: DC

## 2021-01-25 MED ORDER — PHENYLEPHRINE HCL (PRESSORS) 10 MG/ML IV SOLN
INTRAVENOUS | Status: DC | PRN
  Administered 2021-01-25 (×2): 40 ug via INTRAVENOUS

## 2021-01-25 MED ORDER — FENTANYL CITRATE (PF) 100 MCG/2ML IJ SOLN
INTRAMUSCULAR | Status: DC | PRN
  Administered 2021-01-25: 25 ug via INTRAVENOUS

## 2021-01-25 MED ORDER — ONDANSETRON HCL 4 MG/2ML IJ SOLN
INTRAMUSCULAR | Status: AC
  Filled 2021-01-25: qty 2

## 2021-01-25 MED ORDER — PROPOFOL 10 MG/ML IV BOLUS
INTRAVENOUS | Status: AC
  Filled 2021-01-25: qty 40

## 2021-01-25 MED ORDER — CHLORHEXIDINE GLUCONATE CLOTH 2 % EX PADS: 6.0000 | MEDICATED_PAD | Freq: Once | CUTANEOUS | Status: DC

## 2021-01-25 MED ORDER — EPHEDRINE 5 MG/ML INJ
INTRAVENOUS | Status: AC
  Filled 2021-01-25: qty 5

## 2021-01-25 MED ORDER — TRAMADOL HCL 50 MG PO TABS: 50.0000 mg | ORAL_TABLET | Freq: Four times a day (QID) | ORAL | 0 refills | Status: DC | PRN

## 2021-01-25 MED ORDER — MIDAZOLAM HCL 2 MG/2ML IJ SOLN
INTRAMUSCULAR | Status: AC
  Filled 2021-01-25: qty 2

## 2021-01-25 MED ORDER — FENTANYL CITRATE (PF) 100 MCG/2ML IJ SOLN: 25.0000 ug | INTRAMUSCULAR | Status: DC | PRN

## 2021-01-25 MED ORDER — FENTANYL CITRATE (PF) 100 MCG/2ML IJ SOLN
INTRAMUSCULAR | Status: AC
  Filled 2021-01-25: qty 2

## 2021-01-25 MED ORDER — ACETAMINOPHEN 500 MG PO TABS
1000.0000 mg | ORAL_TABLET | ORAL | Status: AC
  Administered 2021-01-25: 1000 mg via ORAL

## 2021-01-25 MED ORDER — LIDOCAINE HCL (CARDIAC) PF 100 MG/5ML IV SOSY
PREFILLED_SYRINGE | INTRAVENOUS | Status: DC | PRN
  Administered 2021-01-25: 60 mg via INTRAVENOUS

## 2021-01-25 MED ORDER — BUPIVACAINE-EPINEPHRINE 0.5% -1:200000 IJ SOLN
INTRAMUSCULAR | Status: DC | PRN
  Administered 2021-01-25: 17 mL

## 2021-01-25 MED ORDER — DEXAMETHASONE SODIUM PHOSPHATE 10 MG/ML IJ SOLN
INTRAMUSCULAR | Status: AC
  Filled 2021-01-25: qty 1

## 2021-01-25 MED ORDER — LIDOCAINE 2% (20 MG/ML) 5 ML SYRINGE
INTRAMUSCULAR | Status: AC
  Filled 2021-01-25: qty 5

## 2021-01-25 MED ORDER — OXYCODONE HCL 5 MG PO TABS: 5.0000 mg | ORAL_TABLET | Freq: Once | ORAL | Status: DC | PRN

## 2021-01-25 MED ORDER — PROPOFOL 10 MG/ML IV BOLUS
INTRAVENOUS | Status: DC | PRN
  Administered 2021-01-25: 140 mg via INTRAVENOUS

## 2021-01-25 MED ORDER — CEFAZOLIN SODIUM-DEXTROSE 2-4 GM/100ML-% IV SOLN
2.0000 g | INTRAVENOUS | Status: AC
  Administered 2021-01-25: 2 g via INTRAVENOUS

## 2021-01-25 MED ORDER — ACETAMINOPHEN 500 MG PO TABS
ORAL_TABLET | ORAL | Status: AC
  Filled 2021-01-25: qty 2

## 2021-01-25 MED ORDER — PHENYLEPHRINE 40 MCG/ML (10ML) SYRINGE FOR IV PUSH (FOR BLOOD PRESSURE SUPPORT)
PREFILLED_SYRINGE | INTRAVENOUS | Status: AC
  Filled 2021-01-25: qty 10

## 2021-01-25 MED ORDER — MIDAZOLAM HCL 5 MG/5ML IJ SOLN
INTRAMUSCULAR | Status: DC | PRN
  Administered 2021-01-25: 2 mg via INTRAVENOUS

## 2021-01-25 MED ORDER — EPHEDRINE SULFATE 50 MG/ML IJ SOLN
INTRAMUSCULAR | Status: DC | PRN
  Administered 2021-01-25 (×2): 10 mg via INTRAVENOUS
  Administered 2021-01-25: 5 mg via INTRAVENOUS

## 2021-01-25 MED ORDER — PROMETHAZINE HCL 25 MG/ML IJ SOLN: 6.2500 mg | INTRAMUSCULAR | Status: DC | PRN

## 2021-01-25 MED ORDER — OXYCODONE HCL 5 MG/5ML PO SOLN
5.0000 mg | Freq: Once | ORAL | Status: DC | PRN
Start: 2021-01-25 — End: 2021-01-25

## 2021-01-25 MED ORDER — CEFAZOLIN SODIUM-DEXTROSE 2-4 GM/100ML-% IV SOLN
INTRAVENOUS | Status: AC
  Filled 2021-01-25: qty 100

## 2021-01-25 SURGICAL SUPPLY — 40 items
ADH SKN CLS APL DERMABOND .7 (GAUZE/BANDAGES/DRESSINGS) ×2
APL PRP STRL LF DISP 70% ISPRP (MISCELLANEOUS) ×2
BINDER BREAST XLRG (GAUZE/BANDAGES/DRESSINGS) ×1 IMPLANT
BINDER BREAST XXLRG (GAUZE/BANDAGES/DRESSINGS) IMPLANT
BLADE SURG 15 STRL LF DISP TIS (BLADE) ×2 IMPLANT
BLADE SURG 15 STRL SS (BLADE) ×3
CHLORAPREP W/TINT 26 (MISCELLANEOUS) ×3 IMPLANT
COVER BACK TABLE 60X90IN (DRAPES) ×3 IMPLANT
COVER MAYO STAND STRL (DRAPES) ×3 IMPLANT
COVER PROBE W GEL 5X96 (DRAPES) ×3 IMPLANT
DERMABOND ADVANCED (GAUZE/BANDAGES/DRESSINGS) ×1
DERMABOND ADVANCED .7 DNX12 (GAUZE/BANDAGES/DRESSINGS) ×2 IMPLANT
DRAPE LAPAROSCOPIC ABDOMINAL (DRAPES) ×3 IMPLANT
DRAPE UTILITY XL STRL (DRAPES) ×3 IMPLANT
ELECT REM PT RETURN 9FT ADLT (ELECTROSURGICAL) ×3
ELECTRODE REM PT RTRN 9FT ADLT (ELECTROSURGICAL) ×2 IMPLANT
GLOVE SURG POLYISO LF SZ6.5 (GLOVE) ×2 IMPLANT
GLOVE SURG SIGNA 7.5 PF LTX (GLOVE) ×3 IMPLANT
GLOVE SURG UNDER POLY LF SZ7 (GLOVE) ×3 IMPLANT
GOWN STRL REUS W/ TWL LRG LVL3 (GOWN DISPOSABLE) ×2 IMPLANT
GOWN STRL REUS W/ TWL XL LVL3 (GOWN DISPOSABLE) ×2 IMPLANT
GOWN STRL REUS W/TWL LRG LVL3 (GOWN DISPOSABLE) ×6
GOWN STRL REUS W/TWL XL LVL3 (GOWN DISPOSABLE) ×3
KIT MARKER MARGIN INK (KITS) ×3 IMPLANT
NDL HYPO 25X1 1.5 SAFETY (NEEDLE) ×2 IMPLANT
NEEDLE HYPO 25X1 1.5 SAFETY (NEEDLE) ×3 IMPLANT
NS IRRIG 1000ML POUR BTL (IV SOLUTION) IMPLANT
PACK BASIN DAY SURGERY FS (CUSTOM PROCEDURE TRAY) ×3 IMPLANT
PENCIL SMOKE EVACUATOR (MISCELLANEOUS) ×3 IMPLANT
SLEEVE SCD COMPRESS KNEE MED (STOCKING) ×3 IMPLANT
SPONGE T-LAP 4X18 ~~LOC~~+RFID (SPONGE) ×3 IMPLANT
SUT MNCRL AB 4-0 PS2 18 (SUTURE) ×3 IMPLANT
SUT SILK 2 0 SH (SUTURE) IMPLANT
SUT VIC AB 3-0 SH 27 (SUTURE) ×3
SUT VIC AB 3-0 SH 27X BRD (SUTURE) ×2 IMPLANT
SYR CONTROL 10ML LL (SYRINGE) ×3 IMPLANT
TOWEL GREEN STERILE FF (TOWEL DISPOSABLE) ×3 IMPLANT
TRAY FAXITRON CT DISP (TRAY / TRAY PROCEDURE) ×3 IMPLANT
TUBE CONNECTING 20X1/4 (TUBING) IMPLANT
YANKAUER SUCT BULB TIP NO VENT (SUCTIONS) IMPLANT

## 2021-01-25 NOTE — Transfer of Care (Signed)
Immediate Anesthesia Transfer of Care Note  Patient: Bethany [name]  Procedure(s) Performed: RIGHT BREAST LUMPECTOMY WITH RADIOACTIVE SEED LOCALIZATION (Right: Breast) ABNORMAL SKIN LESION REMOVAL LEFT ABDOMINAL WALL (Left: Abdomen)  Patient Location: PACU  Anesthesia Type:General  Level of Consciousness: drowsy  Airway & Oxygen Therapy: Patient Spontanous Breathing and Patient connected to face mask oxygen  Post-op Assessment: Report given to RN and Post -op Vital signs reviewed and stable  Post vital signs: Reviewed and stable  Last Vitals:  Vitals Value Taken Time  BP 128/52 01/25/21 0821  Temp    Pulse 54 01/25/21 0823  Resp 18 01/25/21 0823  SpO2 95 % 01/25/21 0823  Vitals shown include unvalidated device data.  Last Pain:  Vitals:   01/25/21 0643  TempSrc: Oral  PainSc: 8       Patients Stated Pain Goal: 6 ([id number])  Complications: No notable events documented.

## 2021-01-25 NOTE — Anesthesia Postprocedure Evaluation (Signed)
Anesthesia Post Note  Patient: Bethany [name]  Procedure(s) Performed: RIGHT BREAST LUMPECTOMY WITH RADIOACTIVE SEED LOCALIZATION (Right: Breast) ABNORMAL SKIN LESION REMOVAL LEFT ABDOMINAL WALL (Left: Abdomen)     Patient location during evaluation: PACU Anesthesia Type: General Level of consciousness: sedated and patient cooperative Pain management: pain level controlled Vital Signs Assessment: post-procedure vital signs reviewed and stable Respiratory status: spontaneous breathing Cardiovascular status: stable Anesthetic complications: no   No notable events documented.  Last Vitals:  Vitals:   01/25/21 0915 01/25/21 0955  BP: (!) 139/53 (!) 123/55  Pulse: (!) 57 (!) 56  Resp: 18 16  Temp:  36.5 C  SpO2: 98% 95%    Last Pain:  Vitals:   01/25/21 0936  TempSrc:   PainSc: 0-No pain                 Nolon Nations

## 2021-01-25 NOTE — Interval H&P Note (Signed)
History and Physical Interval Note: no change in H and P  01/25/2021 7:12 AM  Bethany [name]  has presented today for surgery, with the diagnosis of RIGHT BREAST PAPILLOMA, ABNORMAL SKIN LESION LEFT ABDOMINAL WALL.  The various methods of treatment have been discussed with the patient and family. After consideration of risks, benefits and other options for treatment, the patient has consented to  Procedure(s): RIGHT BREAST LUMPECTOMY WITH RADIOACTIVE SEED LOCALIZATION (Right) ABNORMAL SKIN LESION REMOVAL LEFT ABDOMINAL WALL (Left) as a surgical intervention.  [name] history has been reviewed, patient examined, no change in status, stable for surgery.  I have reviewed the patient's chart and labs.  Questions were answered to the patient's satisfaction.     Coralie Keens

## 2021-01-25 NOTE — Anesthesia Preprocedure Evaluation (Addendum)
Anesthesia Evaluation  Patient identified by MRN, date of birth, ID band Patient awake    Reviewed: Allergy & Precautions, NPO status , Patient's Chart, lab work & pertinent test results  History of Anesthesia Complications Negative for: history of anesthetic complications  Airway Mallampati: II  TM Distance: >3 FB Neck ROM: Full    Dental  (+) Edentulous Upper, Edentulous Lower, Dental Advisory Given   Pulmonary shortness of breath, pneumonia, COPD,  COPD inhaler, Current SmokerPatient did not abstain from smoking.,    Pulmonary exam normal breath sounds clear to auscultation- rhonchi       Cardiovascular hypertension, Pt. on home beta blockers and Pt. on medications +CHF  Normal cardiovascular exam Rhythm:Regular Rate:Normal  Echo 11/2019 1. Left ventricular ejection fraction, by estimation, is 60 to 65%. The left ventricle has normal function. The left ventricle has no regional wall motion abnormalities. There is mild left ventricular hypertrophy. Left ventricular diastolic parameters are consistent with Grade II diastolic dysfunction (pseudonormalization).  Elevated left ventricular end-diastolic pressure.  2. Right ventricular systolic function is normal. The right ventricular size is normal.  3. The mitral valve is normal in structure. Trivial mitral valve regurgitation. No evidence of mitral stenosis.  4. The aortic valve is tricuspid. Aortic valve regurgitation is not visualized. Mild aortic valve sclerosis is present, with no evidence of aortic valve stenosis.  5. The inferior vena cava is normal in size with greater than 50% respiratory variability, suggesting right atrial pressure of 3 mmHg.   '21 Carotid US - 1-39% b/l ICAS   Neuro/Psych  Headaches, PSYCHIATRIC DISORDERS Depression CVA, Residual Symptoms    GI/Hepatic Neg liver ROS, GERD  Medicated and Controlled,  Endo/Other  diabetes, Type 2, Insulin  DependentHypothyroidism  Obesity   Renal/GU CRFRenal disease Solitary kidney     Musculoskeletal  (+) Arthritis ,   Abdominal   Peds  Hematology  (+) anemia ,   Anesthesia Other Findings Covid test negative   Reproductive/Obstetrics                            Anesthesia Physical  Anesthesia Plan  ASA: 3  Anesthesia Plan: General   Post-op Pain Management:    Induction: Intravenous  PONV Risk Score and Plan: 3 and Treatment may vary due to age or medical condition, Ondansetron, Dexamethasone, Midazolam and Diphenhydramine  Airway Management Planned: LMA  Additional Equipment: None  Intra-op Plan:   Post-operative Plan: Extubation in OR  Informed Consent: I have reviewed the patients History and Physical, chart, labs and discussed the procedure including the risks, benefits and alternatives for the proposed anesthesia with the patient or authorized representative who has indicated his/her understanding and acceptance.     Dental advisory given  Plan Discussed with: CRNA  Anesthesia Plan Comments:        Anesthesia Quick Evaluation

## 2021-01-25 NOTE — Op Note (Addendum)
Bethany [name]  01-17-1951  [id number]  01/25/2021   Preoperative diagnosis: R breast mass  Postoperative diagnosis: R breast mass  Procedure: R breast lumpectomy, left lower chest skin tag removal (1.5 cm)  Surgeon: Coralie Keens, MD  Assistant: Drucie Ip, MD  Anesthesia: MAC  Clinical History and Indications: this patient presents for a guidewire localized excision of a R breast papilloma.  Description of procedure: The patient was seen in the holding area and the plans for the procedure reviewed. The right breast and left chest skin tag were marked as the operative side. The radioactive seed localization films were reviewed.  The patient was taken to the operating room and after satisfactory general anesthesia had been obtained, bilateral breasts were prepped and draped and the timeout was performed.  The incision was made just lateral to the lateral nipple and tissue was tunneled over to the presumed area of the mass on the right breast. Skin flaps were raised and using cautery the area was completely excised. Bleeders were controlled with either cautery or sutures as needed.  After achieving hemostasis, the deep tissue layer was closed with 3-0 Vicryl, the incision was closed with 3-0 Vicryl, 4-0 Monocryl subcuticular, and Dermabond.  Attention was taken to the left side of the chest where a small skin lesion was noted inferior to her inframammary fold. This was removed sharply with a knife and hemostasis was achieved. This was covered with Dermabond.  The patient tolerated the procedure well. There were no operative complications. All counts were correct.   EBL: minimal  Coralie Keens, MD 01/25/2021 8:18 AM  I was present for the critical and key portions of the surgery and I was immediately available throughout the entire procedure.  I have reviewed and agree with the operative note as documented by the resident.

## 2021-01-25 NOTE — Discharge Instructions (Addendum)
No Tylenol until 1pm today if needed   [employer] Phone Number [phone]  BREAST BIOPSY/ PARTIAL MASTECTOMY: POST OP INSTRUCTIONS  Always review your discharge instruction sheet given to you by the facility where your surgery was performed.  IF YOU HAVE DISABILITY OR FAMILY LEAVE FORMS, YOU MUST BRING THEM TO THE OFFICE FOR PROCESSING.  DO NOT GIVE THEM TO YOUR DOCTOR.  A prescription for pain medication may be given to you upon discharge.  Take your pain medication as prescribed, if needed.  If narcotic pain medicine is not needed, then you may take acetaminophen (Tylenol) or ibuprofen (Advil) as needed. Take your usually prescribed medications unless otherwise directed If you need a refill on your pain medication, please contact your pharmacy.  They will contact our office to request authorization.  Prescriptions will not be filled after 5pm or on week-ends. You should eat very light the first 24 hours after surgery, such as soup, crackers, pudding, etc.  Resume your normal diet the day after surgery. Most patients will experience some swelling and bruising in the breast.  Ice packs and a good support bra will help.  Swelling and bruising can take several days to resolve.  It is common to experience some constipation if taking pain medication after surgery.  Increasing fluid intake and taking a stool softener will usually help or prevent this problem from occurring.  A mild laxative (Milk of Magnesia or Miralax) should be taken according to package directions if there are no bowel movements after 48 hours. Unless discharge instructions indicate otherwise, you may remove your bandages 24-48 hours after surgery, and you may shower at that time.  You may have steri-strips (small skin tapes) in place directly over the incision.  These strips should be left on the skin for 7-10 days.  If your surgeon used skin glue on the incision, you may shower in 24 hours.  The glue will  flake off over the next 2-3 weeks.  Any sutures or staples will be removed at the office during your follow-up visit. ACTIVITIES:  You may resume regular daily activities (gradually increasing) beginning the next day.  Wearing a good support bra or sports bra minimizes pain and swelling.  You may have sexual intercourse when it is comfortable. You may drive when you no longer are taking prescription pain medication, you can comfortably wear a seatbelt, and you can safely maneuver your car and apply brakes. RETURN TO WORK:  ______________________________________________________________________________________ Dennis Bast should see your doctor in the office for a follow-up appointment approximately two weeks after your surgery.  Your doctor's nurse will typically make your follow-up appointment when she calls you with your pathology report.  Expect your pathology report 2-3 business days after your surgery.  You may call to check if you do not hear from [place] after three days. OTHER INSTRUCTIONS: OK TO REMOVE THE BINDER AND SHOWER STARTING TOMORROW ICE PACK, TYLENOL, AND IBUPROFEN ALSO FOR PAIN NO VIGOROUS ACTIVITY FOR ONE WEEK _______________________________________________________________________________________________ _____________________________________________________________________________________________________________________________________ _____________________________________________________________________________________________________________________________________ _____________________________________________________________________________________________________________________________________  WHEN TO CALL YOUR DOCTOR: Fever over 101.0 Nausea and/or vomiting. Extreme swelling or bruising. Continued bleeding from incision. Increased pain, redness, or drainage from the incision.  The clinic staff is available to answer your questions during regular business hours.  Please don't hesitate  to call and ask to speak to one of the nurses for clinical concerns.  If you have a medical emergency, go to the nearest emergency room or call 911.  A surgeon from [facility]  is always on call at the hospital.  For further questions, please visit [email or web address]     Post Anesthesia Home Care Instructions  Activity: Get plenty of rest for the remainder of the day. A responsible individual must stay with you for 24 hours following the procedure.  For the next 24 hours, DO NOT: -Drive a car -Paediatric nurse -Drink alcoholic beverages -Take any medication unless instructed by your physician -Make any legal decisions or sign important papers.  Meals: Start with liquid foods such as gelatin or soup. Progress to regular foods as tolerated. Avoid greasy, spicy, heavy foods. If nausea and/or vomiting occur, drink only clear liquids until the nausea and/or vomiting subsides. Call your physician if vomiting continues.  Special Instructions/Symptoms: Your throat may feel dry or sore from the anesthesia or the breathing tube placed in your throat during surgery. If this causes discomfort, gargle with warm salt water. The discomfort should disappear within 24 hours.  If you had a scopolamine patch placed behind your ear for the management of post- operative nausea and/or vomiting:  1. The medication in the patch is effective for 72 hours, after which it should be removed.  Wrap patch in a tissue and discard in the trash. Wash hands thoroughly with soap and water. 2. You may remove the patch earlier than 72 hours if you experience unpleasant side effects which may include dry mouth, dizziness or visual disturbances. 3. Avoid touching the patch. Wash your hands with soap and water after contact with the patch.

## 2021-01-25 NOTE — Anesthesia Procedure Notes (Signed)
Procedure Name: LMA Insertion Date/Time: 01/25/2021 7:25 AM Performed by: Lavonia Dana, CRNA Pre-anesthesia Checklist: Patient identified, Emergency Drugs available, Suction available and Patient being monitored Patient Re-evaluated:Patient Re-evaluated prior to induction Oxygen Delivery Method: Circle system utilized Preoxygenation: Pre-oxygenation with 100% oxygen Induction Type: IV induction Ventilation: Mask ventilation without difficulty LMA: LMA inserted LMA Size: 4.0 Number of attempts: 1 Airway Equipment and Method: Bite block Placement Confirmation: positive ETCO2 Tube secured with: Tape Dental Injury: Teeth and Oropharynx as per pre-operative assessment

## 2021-01-26 ENCOUNTER — Encounter (HOSPITAL_BASED_OUTPATIENT_CLINIC_OR_DEPARTMENT_OTHER): Payer: Self-pay | Admitting: Surgery

## 2021-01-27 LAB — SURGICAL PATHOLOGY

## 2021-02-23 ENCOUNTER — Inpatient Hospital Stay: Payer: Medicare Other | Attending: Hematology

## 2021-02-23 ENCOUNTER — Other Ambulatory Visit: Payer: Self-pay | Admitting: Hematology

## 2021-02-23 ENCOUNTER — Other Ambulatory Visit: Payer: Self-pay

## 2021-02-23 DIAGNOSIS — D500 Iron deficiency anemia secondary to blood loss (chronic): Secondary | ICD-10-CM | POA: Diagnosis present

## 2021-02-23 DIAGNOSIS — Q2733 Arteriovenous malformation of digestive system vessel: Secondary | ICD-10-CM | POA: Diagnosis present

## 2021-02-23 DIAGNOSIS — Z452 Encounter for adjustment and management of vascular access device: Secondary | ICD-10-CM | POA: Insufficient documentation

## 2021-02-23 DIAGNOSIS — K5520 Angiodysplasia of colon without hemorrhage: Secondary | ICD-10-CM

## 2021-02-23 LAB — CMP (CANCER CENTER ONLY)
ALT: 38 U/L (ref 0–44)
AST: 39 U/L (ref 15–41)
Albumin: 3.4 g/dL — ABNORMAL LOW (ref 3.5–5.0)
Alkaline Phosphatase: 281 U/L — ABNORMAL HIGH (ref 38–126)
Anion gap: 9 (ref 5–15)
BUN: 17 mg/dL (ref 8–23)
CO2: 26 mmol/L (ref 22–32)
Calcium: 10.3 mg/dL (ref 8.9–10.3)
Chloride: 105 mmol/L (ref 98–111)
Creatinine: 1.18 mg/dL — ABNORMAL HIGH (ref 0.44–1.00)
GFR, Estimated: 49 mL/min — ABNORMAL LOW (ref >60)
Glucose, Bld: 141 mg/dL — ABNORMAL HIGH (ref 70–99)
Potassium: 4.2 mmol/L (ref 3.5–5.1)
Sodium: 140 mmol/L (ref 135–145)
Total Bilirubin: 0.3 mg/dL (ref 0.3–1.2)
Total Protein: 6.8 g/dL (ref 6.5–8.1)

## 2021-02-23 LAB — CBC WITH DIFFERENTIAL (CANCER CENTER ONLY)
Abs Immature Granulocytes: 0.02 10*3/uL (ref 0.00–0.07)
Basophils Absolute: 0 10*3/uL (ref 0.0–0.1)
Basophils Relative: 0 %
Eosinophils Absolute: 0.2 10*3/uL (ref 0.0–0.5)
Eosinophils Relative: 3 %
HCT: 33.1 % — ABNORMAL LOW (ref 36.0–46.0)
Hemoglobin: 9.7 g/dL — ABNORMAL LOW (ref 12.0–15.0)
Immature Granulocytes: 0 %
Lymphocytes Relative: 18 %
Lymphs Abs: 1.2 10*3/uL (ref 0.7–4.0)
MCH: 22.8 pg — ABNORMAL LOW (ref 26.0–34.0)
MCHC: 29.3 g/dL — ABNORMAL LOW (ref 30.0–36.0)
MCV: 77.9 fL — ABNORMAL LOW (ref 80.0–100.0)
Monocytes Absolute: 0.8 10*3/uL (ref 0.1–1.0)
Monocytes Relative: 11 %
Neutro Abs: 4.7 10*3/uL (ref 1.7–7.7)
Neutrophils Relative %: 68 %
Platelet Count: 276 10*3/uL (ref 150–400)
RBC: 4.25 MIL/uL (ref 3.87–5.11)
RDW: 17.4 % — ABNORMAL HIGH (ref 11.5–15.5)
WBC Count: 6.9 10*3/uL (ref 4.0–10.5)
nRBC: 0 % (ref 0.0–0.2)

## 2021-02-23 MED ORDER — HEPARIN SOD (PORK) LOCK FLUSH 100 UNIT/ML IV SOLN
250.0000 [IU] | Freq: Once | INTRAVENOUS | Status: AC
  Administered 2021-02-23: 500 [IU]

## 2021-02-23 MED ORDER — SODIUM CHLORIDE 0.9% FLUSH
10.0000 mL | Freq: Once | INTRAVENOUS | Status: AC
Start: 2021-02-23 — End: 2021-02-23
  Administered 2021-02-23: 10 mL

## 2021-03-03 ENCOUNTER — Encounter: Payer: Self-pay | Admitting: Adult Health

## 2021-03-03 ENCOUNTER — Other Ambulatory Visit: Payer: Self-pay

## 2021-03-03 ENCOUNTER — Ambulatory Visit (INDEPENDENT_AMBULATORY_CARE_PROVIDER_SITE_OTHER): Payer: Medicare Other | Admitting: Adult Health

## 2021-03-03 VITALS — BP 98/55 | HR 54 | Ht 64.0 in | Wt 171.0 lb

## 2021-03-03 DIAGNOSIS — I639 Cerebral infarction, unspecified: Secondary | ICD-10-CM | POA: Diagnosis not present

## 2021-03-03 DIAGNOSIS — I671 Cerebral aneurysm, nonruptured: Secondary | ICD-10-CM | POA: Diagnosis not present

## 2021-03-03 DIAGNOSIS — Z95828 Presence of other vascular implants and grafts: Secondary | ICD-10-CM

## 2021-03-03 MED ORDER — LIDOCAINE-PRILOCAINE 2.5-2.5 % EX CREA: 1.0000 "application " | TOPICAL_CREAM | CUTANEOUS | 1 refills | Status: DC | PRN

## 2021-03-03 NOTE — Patient Instructions (Signed)
Continue aspirin 81 mg daily  and atorvastatin and zetia  for secondary stroke prevention  Continue to follow up with PCP regarding cholesterol, diabetes and blood pressure management  Maintain strict control of hypertension with blood pressure goal below 130/90, diabetes with hemoglobin A1c goal below 7.0% and cholesterol with LDL cholesterol (bad cholesterol) goal below 70 mg/dL.      Followup in the future with me in 1 year or call earlier if needed       Thank you for coming to see [place] at [facility]. I hope we have been able to provide you high quality care today.  You may receive a patient satisfaction survey over the next few weeks. We would appreciate your feedback and comments so that we may continue to improve ourselves and the health of our patients.
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
## 2021-03-04 ENCOUNTER — Encounter: Payer: Self-pay | Admitting: Adult Health

## 2021-03-10 ENCOUNTER — Telehealth: Payer: Self-pay

## 2021-03-10 NOTE — Telephone Encounter (Signed)
This nurse called patient and made her aware of lab results and recommendations per MD.  Patient acknowledged understanding.  No further questions or concerns at this time.

## 2021-03-10 NOTE — Telephone Encounter (Signed)
-----   Message from Truitt Merle, MD sent at 02/23/2021  4:43 PM EDT ----- Please let pt know her lab results. Kidney function improved, anemia is stable, no other new concerns. Thanks   Truitt Merle  02/23/2021

## 2021-04-28 ENCOUNTER — Inpatient Hospital Stay: Payer: Medicare Other

## 2021-04-28 ENCOUNTER — Other Ambulatory Visit: Payer: Self-pay

## 2021-04-28 ENCOUNTER — Inpatient Hospital Stay: Payer: Medicare Other | Attending: Hematology

## 2021-04-28 ENCOUNTER — Encounter: Payer: Self-pay | Admitting: Hematology

## 2021-04-28 ENCOUNTER — Inpatient Hospital Stay (HOSPITAL_BASED_OUTPATIENT_CLINIC_OR_DEPARTMENT_OTHER): Payer: Medicare Other | Admitting: Hematology

## 2021-04-28 VITALS — BP 142/49 | HR 64 | Temp 98.1°F | Resp 16

## 2021-04-28 VITALS — BP 127/43 | HR 54 | Temp 98.6°F | Resp 17 | Ht 64.0 in | Wt 175.7 lb

## 2021-04-28 DIAGNOSIS — K5520 Angiodysplasia of colon without hemorrhage: Secondary | ICD-10-CM

## 2021-04-28 DIAGNOSIS — R112 Nausea with vomiting, unspecified: Secondary | ICD-10-CM | POA: Diagnosis not present

## 2021-04-28 DIAGNOSIS — I129 Hypertensive chronic kidney disease with stage 1 through stage 4 chronic kidney disease, or unspecified chronic kidney disease: Secondary | ICD-10-CM | POA: Diagnosis not present

## 2021-04-28 DIAGNOSIS — D500 Iron deficiency anemia secondary to blood loss (chronic): Secondary | ICD-10-CM

## 2021-04-28 DIAGNOSIS — N1832 Chronic kidney disease, stage 3b: Secondary | ICD-10-CM | POA: Insufficient documentation

## 2021-04-28 DIAGNOSIS — M549 Dorsalgia, unspecified: Secondary | ICD-10-CM | POA: Diagnosis not present

## 2021-04-28 DIAGNOSIS — J449 Chronic obstructive pulmonary disease, unspecified: Secondary | ICD-10-CM | POA: Diagnosis not present

## 2021-04-28 DIAGNOSIS — Z79899 Other long term (current) drug therapy: Secondary | ICD-10-CM | POA: Insufficient documentation

## 2021-04-28 DIAGNOSIS — R040 Epistaxis: Secondary | ICD-10-CM | POA: Diagnosis not present

## 2021-04-28 DIAGNOSIS — K5900 Constipation, unspecified: Secondary | ICD-10-CM | POA: Insufficient documentation

## 2021-04-28 DIAGNOSIS — G8929 Other chronic pain: Secondary | ICD-10-CM | POA: Diagnosis not present

## 2021-04-28 DIAGNOSIS — E1142 Type 2 diabetes mellitus with diabetic polyneuropathy: Secondary | ICD-10-CM | POA: Diagnosis not present

## 2021-04-28 DIAGNOSIS — Q2733 Arteriovenous malformation of digestive system vessel: Secondary | ICD-10-CM | POA: Insufficient documentation

## 2021-04-28 DIAGNOSIS — R911 Solitary pulmonary nodule: Secondary | ICD-10-CM | POA: Insufficient documentation

## 2021-04-28 DIAGNOSIS — D241 Benign neoplasm of right breast: Secondary | ICD-10-CM | POA: Diagnosis not present

## 2021-04-28 DIAGNOSIS — K5521 Angiodysplasia of colon with hemorrhage: Secondary | ICD-10-CM | POA: Diagnosis not present

## 2021-04-28 LAB — IRON AND TIBC
Iron: 17 ug/dL — ABNORMAL LOW (ref 41–142)
Saturation Ratios: 4 % — ABNORMAL LOW (ref 21–57)
TIBC: 418 ug/dL (ref 236–444)
UIBC: 400 ug/dL — ABNORMAL HIGH (ref 120–384)

## 2021-04-28 LAB — CMP (CANCER CENTER ONLY)
ALT: 25 U/L (ref 0–44)
AST: 23 U/L (ref 15–41)
Albumin: 3.3 g/dL — ABNORMAL LOW (ref 3.5–5.0)
Alkaline Phosphatase: 173 U/L — ABNORMAL HIGH (ref 38–126)
Anion gap: 6 (ref 5–15)
BUN: 26 mg/dL — ABNORMAL HIGH (ref 8–23)
CO2: 25 mmol/L (ref 22–32)
Calcium: 9.3 mg/dL (ref 8.9–10.3)
Chloride: 106 mmol/L (ref 98–111)
Creatinine: 1.45 mg/dL — ABNORMAL HIGH (ref 0.44–1.00)
GFR, Estimated: 39 mL/min — ABNORMAL LOW (ref >60)
Glucose, Bld: 180 mg/dL — ABNORMAL HIGH (ref 70–99)
Potassium: 4.8 mmol/L (ref 3.5–5.1)
Sodium: 137 mmol/L (ref 135–145)
Total Bilirubin: 0.3 mg/dL (ref 0.3–1.2)
Total Protein: 6.4 g/dL — ABNORMAL LOW (ref 6.5–8.1)

## 2021-04-28 LAB — CBC WITH DIFFERENTIAL (CANCER CENTER ONLY)
Abs Immature Granulocytes: 0.03 10*3/uL (ref 0.00–0.07)
Basophils Absolute: 0 10*3/uL (ref 0.0–0.1)
Basophils Relative: 0 %
Eosinophils Absolute: 0.2 10*3/uL (ref 0.0–0.5)
Eosinophils Relative: 2 %
HCT: 25.7 % — ABNORMAL LOW (ref 36.0–46.0)
Hemoglobin: 7.7 g/dL — ABNORMAL LOW (ref 12.0–15.0)
Immature Granulocytes: 0 %
Lymphocytes Relative: 19 %
Lymphs Abs: 1.4 10*3/uL (ref 0.7–4.0)
MCH: 21.4 pg — ABNORMAL LOW (ref 26.0–34.0)
MCHC: 30 g/dL (ref 30.0–36.0)
MCV: 71.6 fL — ABNORMAL LOW (ref 80.0–100.0)
Monocytes Absolute: 0.8 10*3/uL (ref 0.1–1.0)
Monocytes Relative: 11 %
Neutro Abs: 4.9 10*3/uL (ref 1.7–7.7)
Neutrophils Relative %: 68 %
Platelet Count: 250 10*3/uL (ref 150–400)
RBC: 3.59 MIL/uL — ABNORMAL LOW (ref 3.87–5.11)
RDW: 19.3 % — ABNORMAL HIGH (ref 11.5–15.5)
WBC Count: 7.3 10*3/uL (ref 4.0–10.5)
nRBC: 0.3 % — ABNORMAL HIGH (ref 0.0–0.2)

## 2021-04-28 LAB — FERRITIN: Ferritin: 14 ng/mL (ref 11–307)

## 2021-04-28 MED ORDER — SODIUM CHLORIDE 0.9 % IV SOLN
300.0000 mg | Freq: Once | INTRAVENOUS | Status: AC
  Administered 2021-04-28: 16:00:00 300 mg via INTRAVENOUS
  Filled 2021-04-28: qty 300

## 2021-04-28 MED ORDER — SODIUM CHLORIDE 0.9% FLUSH
10.0000 mL | Freq: Once | INTRAVENOUS | Status: AC
  Administered 2021-04-28: 14:00:00 10 mL

## 2021-04-28 MED ORDER — SODIUM CHLORIDE 0.9 % IV SOLN: Freq: Once | INTRAVENOUS | Status: AC

## 2021-04-28 MED ORDER — HEPARIN SOD (PORK) LOCK FLUSH 100 UNIT/ML IV SOLN
500.0000 [IU] | Freq: Once | INTRAVENOUS | Status: AC
  Administered 2021-04-28: 18:00:00 500 [IU]

## 2021-04-28 MED ORDER — HEPARIN SOD (PORK) LOCK FLUSH 100 UNIT/ML IV SOLN
500.0000 [IU] | Freq: Once | INTRAVENOUS | Status: AC
  Administered 2021-04-28: 14:00:00 500 [IU]

## 2021-04-28 MED ORDER — SODIUM CHLORIDE 0.9 % IV SOLN: 400.0000 mg | Freq: Once | INTRAVENOUS | Status: DC

## 2021-04-28 MED ORDER — SODIUM CHLORIDE 0.9% FLUSH
10.0000 mL | Freq: Once | INTRAVENOUS | Status: AC
  Administered 2021-04-28: 18:00:00 10 mL

## 2021-04-28 MED ORDER — SODIUM CHLORIDE 0.9 % IV SOLN: 200.0000 mg | Freq: Once | INTRAVENOUS | Status: DC

## 2021-04-28 MED ORDER — ALTEPLASE 2 MG IJ SOLR: 2.0000 mg | Freq: Once | INTRAMUSCULAR | Status: DC | PRN

## 2021-04-28 NOTE — Patient Instructions (Signed)
[facility]  Discharge Instructions: Thank you for choosing [facility] to provide your oncology and hematology care.   If you have a lab appointment with the [facility], please go directly to the [facility] and check in at the registration area.   Wear comfortable clothing and clothing appropriate for easy access to any Portacath or PICC line.   We strive to give you quality time with your provider. You may need to reschedule your appointment if you arrive late (15 or more minutes).  Arriving late affects you and other patients whose appointments are after yours.  Also, if you miss three or more appointments without notifying the office, you may be dismissed from the clinic at the providers discretion.      For prescription refill requests, have your pharmacy contact our office and allow 72 hours for refills to be completed.    Today you received the following: Venofer   To help prevent nausea and vomiting after your treatment, we encourage you to take your nausea medication as directed.  BELOW ARE SYMPTOMS THAT SHOULD BE REPORTED IMMEDIATELY: *FEVER GREATER THAN 100.4 F (38 C) OR HIGHER *CHILLS OR SWEATING *NAUSEA AND VOMITING THAT IS NOT CONTROLLED WITH YOUR NAUSEA MEDICATION *UNUSUAL SHORTNESS OF BREATH *UNUSUAL BRUISING OR BLEEDING *URINARY PROBLEMS (pain or burning when urinating, or frequent urination) *BOWEL PROBLEMS (unusual diarrhea, constipation, pain near the anus) TENDERNESS IN MOUTH AND THROAT WITH OR WITHOUT PRESENCE OF ULCERS (sore throat, sores in mouth, or a toothache) UNUSUAL RASH, SWELLING OR PAIN  UNUSUAL VAGINAL DISCHARGE OR ITCHING   Items with * indicate a potential emergency and should be followed up as soon as possible or go to the Emergency Department if any problems should occur.  Please show the CHEMOTHERAPY ALERT CARD or IMMUNOTHERAPY ALERT CARD at check-in to the Emergency Department and triage  nurse.  Should you have questions after your visit or need to cancel or reschedule your appointment, please contact [facility]  Dept: [phone]  and follow the prompts.  Office hours are 8:00 a.m. to 4:30 p.m. Monday - Friday. Please note that voicemails left after 4:00 p.m. may not be returned until the following business day.  We are closed weekends and major holidays. You have access to a nurse at all times for urgent questions. Please call the main number to the clinic Dept: [phone] and follow the prompts.   For any non-urgent questions, you may also contact your provider using MyChart. We now offer e-Visits for anyone 28 and older to request care online for non-urgent symptoms. For details visit [email or web address].   Also download the MyChart app! Go to the app store, search "MyChart", open the app, select [facility], and log in with your MyChart username and password.  Due to Covid, a mask is required upon entering the hospital/clinic. If you do not have a mask, one will be given to you upon arrival. For doctor visits, patients may have 1 support person aged 4 or older with them. For treatment visits, patients cannot have anyone with them due to current Covid guidelines and our immunocompromised population.

## 2021-04-28 NOTE — Progress Notes (Signed)
Pt declined to stay for 30 minute post Venofer infusion observation.  Pt tolerated treatment well w/out incident.  VSS at discharge.  Ambulatory to lobby.

## 2021-04-28 NOTE — Progress Notes (Signed)
[place]   [phone] [phone]) [phone]   Clinic Follow up Note   Patient Care Team: Arthur Holms, NP as PCP - General (Nurse Practitioner) Audie Box, Cassie Freer, MD as PCP - Cardiology (Cardiology) Truitt Merle, MD as Consulting Physician (Hematology) Angelia Mould, MD as Consulting Physician (Vascular Surgery) Koleen Distance, MD as Referring Physician (Gastroenterology)  Date of Service:  04/28/2021  CHIEF COMPLAINT: f/u of anemia  CURRENT THERAPY:  IV Feraheme as needed (if ferritin <100). Changed to IV Venofer on 01/02/19. Due to recent GI bleeding, we increased Venofer to every 2 weeks on 05/22/20. no need for IV iron since 12/02/20   ASSESSMENT & PLAN:  Bethany [name] is a 70 y.o. female with   1. Iron deficient anemia, secondary to chronic blood loss from GI AVM -Pt has intestinal AVM history with associated chronic GI blood loss resulting in iron deficiency and blood loss anemia. She has required blood transfusions in the past in 2013 and in 2018 and frequent hospital admission for anemia. She does not meet the diagnosis criteria for hereditary hemorrhagic telangiectasia. -Her last colonoscopy/endoscopy was 04/2017.  -She is not on oral iron, due to the lack of benefit.  -Currently being treated with IV Iron to prevent significant anemia with goal of Ferritin 100-200 range. She was switched to IV Venofer on 01/02/19 due to her insurance coverage. Increased to 424m with each infusion from 02/14/20. Due to recent GI bleeding, we increased Venofer to every 1-2 weeks on 05/22/20. Given severe anemia, Plavix has been held and continues Aspirin. -Her anemia and iron deficiency has much improved since she stopped her Plavix, last IV iron on 12/02/20. -labs reviewed, her hgb is down to 7.7 today. We will plan for IV iron today if possible.  2. Multiple lung nodules, Current Smoker with COPD/Emphysema -She has been smoking intermittently for 40 years. Then  increased smoking daily after her mother passed in 2021.  -nodules initially seen on screening chest CT 07/12/19. Repeat 07/13/20 showed new nodules. PET scan on 07/28/20 showed mild hypermetabolism to dominant LUL nodule. -Biopsy of left lung was attempted by Dr WPascal Luxbut held given difficult location. -repeat chest CT 12/24/20 was overall stable, with some indicating a likely benign inflammatory process, and decrease in size of LUL nodule. -She continues to smoke. I have previously discussed complete smoking cessation.   3. Intraductal papilloma -Pt notes h/o benign right breast fibroadenoma, biopsied in 2014, stable on 05/2017 mammogram. -PET 07/28/20 showed nonspecific RIGHT breast mass with low level FDG uptake  -biopsy on 08/25/20 showed ductal papilloma with usual ductal hyperplasia -right lumpectomy on 01/25/21 with Dr. BNinfa Lindenshowed intraductal papilloma, partially fibrotic, and complex sclerosing lesion.   4. Chronic GI Bleeding, intermittent epistaxis, constipation, N&V -Has intestinal AVM history -Her colonoscopy/EGD by Dr. SMichail Sermonon 04/17/17 showed gastritis, diverticulosis and internal hemorrhoids, all benign with no obvious signs of bleeding at that time. She had repeat exams with Dr SRodena Pietyend of 2019. -She does not meet the diagnosis criteria for hereditary hemorrhagic telangiectasia (HHT)     5. H/o of arterial thrombosis, Ischemic stroke 11/2019, Left Brain Aneurysm (464mand 44m80m-S/p unilateral nephrectomy due to damage from arterial thrombosis -Managed by vascular physician, Dr. DixDoren Custardd neurologist Dr SetLeonie ManPrevious work-up for lupus anticoagulant and antiphospholipid syndrome were negative, normal protein S and C level. -She notes she still has mild speech deficit (difficulty finding her words). -On 02/06/20 Dr DevAllegra Granaund a second brain aneurysm. She  underwent embolization surgery on 05/04/20  -After 04/2020 procedure she has restarted baby aspirin and Plavix until her  next neuro f/u. Given increased GI bleeding and worsened anemia, we have held Plavix since 05/22/20. She is fine to continue baby aspirin.     6. COPD, HTN, CKD stage III, DM with Peripheral Neuropathy, Chronic Back Pain -She is currently on 686m Gabapentin 4 times a day.  -[facility].     PLAN: -IV Venofer 300 mg today and in 1, 2, and 4 weeks -lab in 1, 2, 4, and 8 weeks -f/u in 8 weeks -pulmonary consult on 12/16    No problem-specific Assessment & Plan notes found for this encounter.   INTERVAL HISTORY:  Bethany BOTELLOis here for a follow up of anemia. She was last seen by me on 12/29/20. She presents to the clinic alone. She reports she is having worsening headaches. She denies any bleeding or dark stool. She notes her sister is currently undergoing breast cancer care/treatment.   All other systems were reviewed with the patient and are negative.  MEDICAL HISTORY:  Past Medical History:  Diagnosis Date   Acute renal failure (HCC)    Anemia    Arthritis    COPD (chronic obstructive pulmonary disease) (HCC)    Diabetes mellitus without complication (HCC)    type II    GERD (gastroesophageal reflux disease)    Hypercalcemia    Hyperlipidemia    Hypertension    Pneumonia    Renal disorder    Single kidney    Stroke ([facility]    July 2021   Thrombosis    Tobacco abuse     SURGICAL HISTORY: Past Surgical History:  Procedure Laterality Date   ABDOMINAL HYSTERECTOMY     blood clots removed from descending aorta      BREAST LUMPECTOMY WITH RADIOACTIVE SEED LOCALIZATION Right 01/25/2021   Procedure: RIGHT BREAST LUMPECTOMY WITH RADIOACTIVE SEED LOCALIZATION;  Surgeon: BCoralie Keens MD;  [facility]  Service: General;  Laterality: Right;   CHOLECYSTECTOMY     COLONOSCOPY     COLONOSCOPY WITH PROPOFOL N/A 04/17/2017   Procedure: COLONOSCOPY WITH PROPOFOL;  Surgeon:  SWilford Corner MD;  Location: [facility];  Service: Endoscopy;  Laterality: N/A;   ESOPHAGOGASTRODUODENOSCOPY (EGD) WITH PROPOFOL N/A 04/17/2017   Procedure: ESOPHAGOGASTRODUODENOSCOPY (EGD) WITH PROPOFOL;  Surgeon: SWilford Corner MD;  Location: [facility];  Service: Endoscopy;  Laterality: N/A;   IR 3D INDEPENDENT WKST  05/04/2020   IR ANGIO INTRA EXTRACRAN SEL COM CAROTID INNOMINATE BILAT MOD SED  02/06/2020   IR ANGIO INTRA EXTRACRAN SEL INTERNAL CAROTID UNI L MOD SED  05/04/2020   IR ANGIO VERTEBRAL SEL VERTEBRAL BILAT MOD SED  02/06/2020   IR ANGIOGRAM FOLLOW UP STUDY  05/04/2020   IR IMAGING GUIDED PORT INSERTION  05/01/2019   IR NEURO EACH ADD'L AFTER BASIC UNI LEFT (MS)  05/04/2020   IR RADIOLOGIST EVAL & MGMT  05/28/2020   IR TRANSCATH/EMBOLIZ  05/04/2020   IR [place] VASC ACCESS RIGHT  02/06/2020   IR [place] VASC ACCESS RIGHT  05/04/2020   LESION REMOVAL Left 01/25/2021   Procedure: ABNORMAL SKIN LESION REMOVAL LEFT ABDOMINAL WALL;  Surgeon: BCoralie Keens MD;  [place] [facility]  Service: General;  Laterality: Left;   NEPHRECTOMY RECIPIENT     RADIOLOGY WITH ANESTHESIA N/A 05/04/2020   Procedure: ESheppard Plumber  Surgeon:  Luanne Bras, MD;  Location: [place];  Service: Radiology;  Laterality: N/A;    I have reviewed the social history and family history with the patient and they are unchanged from previous note.  ALLERGIES:  is allergic to contrast media [iodinated diagnostic agents] and other.  MEDICATIONS:  Current Outpatient Medications  Medication Sig Dispense Refill   allopurinol (ZYLOPRIM) 100 MG tablet Take 100 mg by mouth at bedtime.     amLODipine (NORVASC) 10 MG tablet Take 10 mg by mouth daily.     aspirin EC 81 MG EC tablet Take 1 tablet (81 mg total) by mouth daily. Swallow whole. 30 tablet 11   baclofen (LIORESAL) 10 MG tablet Take 10 mg by mouth 3 (three) times daily as needed for muscle spasms.     clindamycin (CLEOCIN) 300  MG capsule Take 300 mg by mouth 3 (three) times daily.     Dulaglutide (TRULICITY) 1.5 GY/1.7CB SOPN Inject 1.5 mg into the skin every Monday.      ezetimibe (ZETIA) 10 MG tablet Take 10 mg by mouth daily.     feeding supplement, ENSURE ENLIVE, (ENSURE ENLIVE) LIQD Take 237 mLs by mouth 2 (two) times daily between meals. 237 mL 12   furosemide (LASIX) 40 MG tablet Take 40 mg by mouth as needed.     gabapentin (NEURONTIN) 100 MG capsule Take 2 capsules (200 mg total) by mouth 4 (four) times daily. 240 capsule 3   insulin glargine, 1 Unit Dial, (TOUJEO SOLOSTAR) 300 UNIT/ML Solostar Pen Inject 20 Units into the skin at bedtime.      insulin lispro (HUMALOG) 100 UNIT/ML KwikPen INJECT 5 UNITS INTO SKIN BEFORE EACH MEAL WHEN BLOOD SUGAR IS ABOVE 200     Ipratropium-Albuterol (COMBIVENT) 20-100 MCG/ACT AERS respimat Inhale 1 puff into the lungs every 6 (six) hours as needed for wheezing or shortness of breath. 1 Inhaler 0   levothyroxine (SYNTHROID) 75 MCG tablet Take 75 mcg by mouth daily.     lidocaine-prilocaine (EMLA) cream Apply 1 application topically as needed. 30 g 1   losartan (COZAAR) 25 MG tablet Take 1 tablet (25 mg total) by mouth daily. 30 tablet 11   metoprolol (LOPRESSOR) 50 MG tablet Take 50 mg by mouth 2 (two) times daily.      Multiple Vitamin (MULTIVITAMIN WITH MINERALS) TABS tablet Take 1 tablet by mouth daily.     NARCAN 4 MG/0.1ML LIQD nasal spray kit Place 1 spray into the nose as needed for opioid reversal.     oxyCODONE-acetaminophen (PERCOCET) 10-325 MG tablet Take 1 tablet by mouth every 6 (six) hours as needed (breakthrough pain).      pantoprazole (PROTONIX) 40 MG tablet Take 40 mg by mouth every morning.     pregabalin (LYRICA) 25 MG capsule Take 25 mg by mouth 2 (two) times daily.      prochlorperazine (COMPAZINE) 5 MG tablet Take 1 tablet (5 mg total) by mouth every 6 (six) hours as needed for nausea or vomiting. 30 tablet 0   sennosides-docusate sodium (SENOKOT-S)  8.6-50 MG tablet Take 1 tablet by mouth at bedtime.     TRUEPLUS PEN NEEDLES 32G X 4 MM MISC      Vitamin D, Ergocalciferol, (DRISDOL) 1.25 MG (50000 UNIT) CAPS capsule Take 50,000 Units by mouth every Monday.      XTAMPZA ER 27 MG C12A Take 27 mg by mouth 2 (two) times daily.      No current facility-administered medications for this visit.  Facility-Administered Medications Ordered in Other Visits  Medication Dose Route Frequency Provider Last Rate Last Admin   alteplase (CATHFLO ACTIVASE) injection 2 mg  2 mg Intracatheter Once PRN Truitt Merle, MD        PHYSICAL EXAMINATION: ECOG PERFORMANCE STATUS: 1 - Symptomatic but completely ambulatory  Vitals:   04/28/21 1430  BP: (!) 127/43  Pulse: (!) 54  Resp: 17  Temp: 98.6 F (37 C)  SpO2: 100%   Wt Readings from Last 3 Encounters:  04/28/21 175 lb 11.2 oz (79.7 kg)  03/03/21 171 lb (77.6 kg)  01/25/21 169 lb 8.5 oz (76.9 kg)     GENERAL:alert, no distress and comfortable SKIN: skin color normal, no rashes or significant lesions EYES: normal, Conjunctiva are pink and non-injected, sclera clear  NEURO: alert & oriented x 3 with fluent speech  LABORATORY DATA:  I have reviewed the data as listed CBC Latest Ref Rng & Units 04/28/2021 02/23/2021 12/23/2020  WBC 4.0 - 10.5 K/uL 7.3 6.9 7.9  Hemoglobin 12.0 - 15.0 g/dL 7.7(L) 9.7(L) 9.6(L)  Hematocrit 36.0 - 46.0 % 25.7(L) 33.1(L) 32.8(L)  Platelets 150 - 400 K/uL 250 276 233     CMP Latest Ref Rng & Units 04/28/2021 02/23/2021 01/21/2021  Glucose 70 - 99 mg/dL 180(H) 141(H) 131(H)  BUN 8 - 23 mg/dL 26(H) 17 31(H)  Creatinine 0.44 - 1.00 mg/dL 1.45(H) 1.18(H) 1.81(H)  Sodium 135 - 145 mmol/L 137 140 137  Potassium 3.5 - 5.1 mmol/L 4.8 4.2 4.6  Chloride 98 - 111 mmol/L 106 105 105  CO2 22 - 32 mmol/L '25 26 24  ' Calcium 8.9 - 10.3 mg/dL 9.3 10.3 9.6  Total Protein 6.5 - 8.1 g/dL 6.4(L) 6.8 -  Total Bilirubin 0.3 - 1.2 mg/dL 0.3 0.3 -  Alkaline Phos 38 - 126 U/L 173(H) 281(H)  -  AST 15 - 41 U/L 23 39 -  ALT 0 - 44 U/L 25 38 -      RADIOGRAPHIC STUDIES: I have personally reviewed the radiological images as listed and agreed with the findings in the report. No results found.    No orders of the defined types were placed in this encounter.  All questions were answered. The patient knows to call the clinic with any problems, questions or concerns. No barriers to learning was detected. The total time spent in the appointment was 30 minutes.     Truitt Merle, MD 04/28/2021   I, Wilburn Mylar, am acting as scribe for Truitt Merle, MD.   I have reviewed the above documentation for accuracy and completeness, and I agree with the above.

## 2021-04-30 ENCOUNTER — Other Ambulatory Visit: Payer: Self-pay

## 2021-04-30 ENCOUNTER — Ambulatory Visit (INDEPENDENT_AMBULATORY_CARE_PROVIDER_SITE_OTHER): Payer: Medicare Other | Admitting: Emergency Medicine

## 2021-04-30 ENCOUNTER — Encounter: Payer: Self-pay | Admitting: Emergency Medicine

## 2021-04-30 ENCOUNTER — Telehealth: Payer: Self-pay | Admitting: Hematology

## 2021-04-30 DIAGNOSIS — Z720 Tobacco use: Secondary | ICD-10-CM | POA: Insufficient documentation

## 2021-04-30 DIAGNOSIS — J449 Chronic obstructive pulmonary disease, unspecified: Secondary | ICD-10-CM | POA: Diagnosis not present

## 2021-04-30 DIAGNOSIS — R918 Other nonspecific abnormal finding of lung field: Secondary | ICD-10-CM

## 2021-04-30 NOTE — Assessment & Plan Note (Signed)
Severity unclear but minimal symptoms.  She does have albuterol but uses it very rarely.  She will need pulmonary function testing going forward.  Discussed this with her today.  We will work on ordering next time.

## 2021-04-30 NOTE — Telephone Encounter (Signed)
Scheduled follow-up appointments per 12/14 los. Patient is aware.

## 2021-04-30 NOTE — Addendum Note (Signed)
Addended by: Gavin Potters R on: 04/30/2021 11:48 AM   Modules accepted: Orders

## 2021-04-30 NOTE — Assessment & Plan Note (Signed)
We will arrange for CT scan of your chest to further evaluate your pulmonary nodules.  Depending on how the nodules evolve we may decide to pursue a bronchoscopy to perform biopsy. Follow Dr. Lamonte Sakai next available after your CT so we can review the results together.

## 2021-04-30 NOTE — Progress Notes (Signed)
Subjective:    Patient ID: Bethany [name], female    DOB: 1950/09/20, 70 y.o.   MRN: [id number]  HPI 70 year old smoker ( 20 pack years) with a history of mild COPD, diabetes, GERD, hypertension, CVA, chronic renal insufficiency, remote hx descending aortic dissection or aneurism > lost her L kidney.  She is followed by Dr. Burr Medico for iron deficiency anemia.  She is here today to evaluate pulmonary nodular disease.  She had a lung cancer screening CT 07/12/2019 that identified nodular disease, progressive on repeat 07/13/2020.  PET scan showed hypermetabolism and the dominant left upper lobe pulmonary nodule.  Biopsy was attempted by IR but location was difficult and was ultimately abandoned.  A repeat CT chest 12/24/2020 showed a decrease in size of the left upper lobe nodule.  She denies any cough, dyspnea. She is not on scheduled BD. Very rare albuterol use.    Review of Systems As per [facility]  Past Medical History:  Diagnosis Date   Acute renal failure (HCC)    Anemia    Arthritis    COPD (chronic obstructive pulmonary disease) (HCC)    Diabetes mellitus without complication (HCC)    type II    GERD (gastroesophageal reflux disease)    Hypercalcemia    Hyperlipidemia    Hypertension    Pneumonia    Renal disorder    Single kidney    Stroke [facility])    July 2021   Thrombosis    Tobacco abuse      Family History  Problem Relation Age of Onset   Hypertension Mother    Diabetes Father    Hypertension Brother    Breast cancer Maternal Aunt        79s   Breast cancer Maternal Aunt 75       colon cancer     Social History   Socioeconomic History   Marital status: Single    Spouse name: Not on file   Number of children: 3   Years of education: Not on file   Highest education level: Not on file  Occupational History   Occupation: retired   Tobacco Use   Smoking status: Every Day    Packs/day: 0.25    Years: 40.00    Pack years: 10.00    Types: Cigarettes   Smokeless  tobacco: Never   Tobacco comments:    10 cigarettes smoked daily ARJ, RN 04/30/21  Vaping Use   Vaping Use: Never used  Substance and Sexual Activity   Alcohol use: Yes    Comment: socially   Drug use: No   Sexual activity: Not on file  Other Topics Concern   Not on file  Social History Narrative   Lives with daughter    Dorie Rank from Index Determinants of Health   Financial Resource Strain: Not on file  Food Insecurity: Not on file  Transportation Needs: Not on file  Physical Activity: Not on file  Stress: Not on file  Social Connections: Not on file  Intimate Partner Violence: Not on file     Allergies  Allergen Reactions   Contrast Media [Iodinated Diagnostic Agents]     Renal hypertension per physician   Other Other (See Comments)     Outpatient Medications Prior to Visit  Medication Sig Dispense Refill   allopurinol (ZYLOPRIM) 100 MG tablet Take 100 mg by mouth at bedtime.     amLODipine (NORVASC) 10 MG tablet Take 10 mg by mouth daily.  aspirin EC 81 MG EC tablet Take 1 tablet (81 mg total) by mouth daily. Swallow whole. 30 tablet 11   baclofen (LIORESAL) 10 MG tablet Take 10 mg by mouth 3 (three) times daily as needed for muscle spasms.     clindamycin (CLEOCIN) 300 MG capsule Take 300 mg by mouth 3 (three) times daily.     Dulaglutide (TRULICITY) 1.5 BZ/1.6RC SOPN Inject 1.5 mg into the skin every Monday.      ezetimibe (ZETIA) 10 MG tablet Take 10 mg by mouth daily.     feeding supplement, ENSURE ENLIVE, (ENSURE ENLIVE) LIQD Take 237 mLs by mouth 2 (two) times daily between meals. 237 mL 12   furosemide (LASIX) 40 MG tablet Take 40 mg by mouth as needed.     gabapentin (NEURONTIN) 100 MG capsule Take 2 capsules (200 mg total) by mouth 4 (four) times daily. 240 capsule 3   insulin glargine, 1 Unit Dial, (TOUJEO SOLOSTAR) 300 UNIT/ML Solostar Pen Inject 20 Units into the skin at bedtime.      insulin lispro (HUMALOG) 100 UNIT/ML KwikPen INJECT 5 UNITS INTO  SKIN BEFORE EACH MEAL WHEN BLOOD SUGAR IS ABOVE 200     Ipratropium-Albuterol (COMBIVENT) 20-100 MCG/ACT AERS respimat Inhale 1 puff into the lungs every 6 (six) hours as needed for wheezing or shortness of breath. 1 Inhaler 0   levothyroxine (SYNTHROID) 75 MCG tablet Take 75 mcg by mouth daily.     lidocaine-prilocaine (EMLA) cream Apply 1 application topically as needed. 30 g 1   metoprolol (LOPRESSOR) 50 MG tablet Take 50 mg by mouth 2 (two) times daily.      NARCAN 4 MG/0.1ML LIQD nasal spray kit Place 1 spray into the nose as needed for opioid reversal.     oxyCODONE-acetaminophen (PERCOCET) 10-325 MG tablet Take 1 tablet by mouth every 6 (six) hours as needed (breakthrough pain).      pantoprazole (PROTONIX) 40 MG tablet Take 40 mg by mouth every morning.     pregabalin (LYRICA) 25 MG capsule Take 25 mg by mouth 2 (two) times daily.      prochlorperazine (COMPAZINE) 5 MG tablet Take 1 tablet (5 mg total) by mouth every 6 (six) hours as needed for nausea or vomiting. 30 tablet 0   sennosides-docusate sodium (SENOKOT-S) 8.6-50 MG tablet Take 1 tablet by mouth at bedtime.     TRUEPLUS PEN NEEDLES 32G X 4 MM MISC      XTAMPZA ER 27 MG C12A Take 27 mg by mouth 2 (two) times daily.      losartan (COZAAR) 25 MG tablet Take 1 tablet (25 mg total) by mouth daily. 30 tablet 11   Multiple Vitamin (MULTIVITAMIN WITH MINERALS) TABS tablet Take 1 tablet by mouth daily. (Patient not taking: Reported on 04/30/2021)     Vitamin D, Ergocalciferol, (DRISDOL) 1.25 MG (50000 UNIT) CAPS capsule Take 50,000 Units by mouth every Monday.  (Patient not taking: Reported on 04/30/2021)     No facility-administered medications prior to visit.          Objective:   Physical Exam Vitals:   04/30/21 1112  BP: 118/68  Pulse: 61  Temp: 98.8 F (37.1 C)  TempSrc: Oral  SpO2: 99%  Weight: 176 lb 6.4 oz (80 kg)  Height: _0  (1.651 m)   Gen: Pleasant, well-nourished, in no distress,  normal affect  ENT: No  lesions,  mouth clear,  oropharynx clear, no postnasal drip  Neck: No JVD, no stridor  Lungs:  No use of accessory muscles, no crackles or wheezing on normal respiration, no wheeze on forced expiration  Cardiovascular: RRR, heart sounds normal, no murmur or gallops, no peripheral edema  Musculoskeletal: No deformities, no cyanosis or clubbing  Neuro: alert, awake, non focal  Skin: Warm, no lesions or rash      Assessment & Plan:  Multiple lung nodules We will arrange for CT scan of your chest to further evaluate your pulmonary nodules.  Depending on how the nodules evolve we may decide to pursue a bronchoscopy to perform biopsy. Follow Dr. Lamonte Sakai next available after your CT so we can review the results together.  COPD (chronic obstructive pulmonary disease) (HCC) Severity unclear but minimal symptoms.  She does have albuterol but uses it very rarely.  She will need pulmonary function testing going forward.  Discussed this with her today.  We will work on ordering next time.  Tobacco use Smoking about half a pack a day (an increase) but she is interested in cutting down and ultimately stopping.  She is trying to use nicotine patch intermittently.  Advised her to decrease as much possible and then next time we will talk about strategies, try to set a quit date.   Baltazar Apo, MD, PhD 04/30/2021, 11:41 AM [facility] [phone] or if no answer before 7:00PM call [phone] For any issues after 7:00PM please call eLink [phone]

## 2021-04-30 NOTE — Patient Instructions (Addendum)
We will arrange for CT scan of your chest to further evaluate your pulmonary nodules.  Depending on how the nodules evolve we may decide to pursue a bronchoscopy to perform biopsy. Keep your albuterol available to use 2 puffs if needed for shortness of breath, chest tightness, wheezing. We will probably perform pulmonary function testing at some point going forward I am glad you are interested in decreasing your smoking.  Try to cut down as much as possible.  We can talk about setting a quit date when we follow-up. Follow Dr. Lamonte Sakai next available after your CT so we can review the results together.

## 2021-04-30 NOTE — Assessment & Plan Note (Signed)
Smoking about half a pack a day (an increase) but she is interested in cutting down and ultimately stopping.  She is trying to use nicotine patch intermittently.  Advised her to decrease as much possible and then next time we will talk about strategies, try to set a quit date.

## 2021-05-03 ENCOUNTER — Telehealth: Payer: Self-pay | Admitting: Emergency Medicine

## 2021-05-04 NOTE — Telephone Encounter (Signed)
Called and spoke with pt and made her aware of her upcoming appt with RB. Nothing further needed.

## 2021-05-05 ENCOUNTER — Other Ambulatory Visit (HOSPITAL_COMMUNITY): Payer: Self-pay | Admitting: Interventional Radiology

## 2021-05-05 DIAGNOSIS — I671 Cerebral aneurysm, nonruptured: Secondary | ICD-10-CM

## 2021-05-06 ENCOUNTER — Other Ambulatory Visit: Payer: Self-pay

## 2021-05-06 DIAGNOSIS — D500 Iron deficiency anemia secondary to blood loss (chronic): Secondary | ICD-10-CM

## 2021-05-07 ENCOUNTER — Inpatient Hospital Stay: Payer: Medicare Other

## 2021-05-07 ENCOUNTER — Other Ambulatory Visit: Payer: Self-pay

## 2021-05-07 VITALS — BP 104/55 | HR 50 | Temp 98.1°F | Resp 16

## 2021-05-07 DIAGNOSIS — K5520 Angiodysplasia of colon without hemorrhage: Secondary | ICD-10-CM

## 2021-05-07 DIAGNOSIS — D500 Iron deficiency anemia secondary to blood loss (chronic): Secondary | ICD-10-CM

## 2021-05-07 DIAGNOSIS — Q2733 Arteriovenous malformation of digestive system vessel: Secondary | ICD-10-CM | POA: Diagnosis not present

## 2021-05-07 LAB — CMP (CANCER CENTER ONLY)
ALT: 22 U/L (ref 0–44)
AST: 21 U/L (ref 15–41)
Albumin: 4 g/dL (ref 3.5–5.0)
Alkaline Phosphatase: 188 U/L — ABNORMAL HIGH (ref 38–126)
Anion gap: 9 (ref 5–15)
BUN: 33 mg/dL — ABNORMAL HIGH (ref 8–23)
CO2: 23 mmol/L (ref 22–32)
Calcium: 10 mg/dL (ref 8.9–10.3)
Chloride: 106 mmol/L (ref 98–111)
Creatinine: 2 mg/dL — ABNORMAL HIGH (ref 0.44–1.00)
GFR, Estimated: 26 mL/min — ABNORMAL LOW (ref >60)
Glucose, Bld: 110 mg/dL — ABNORMAL HIGH (ref 70–99)
Potassium: 5 mmol/L (ref 3.5–5.1)
Sodium: 138 mmol/L (ref 135–145)
Total Bilirubin: 0.4 mg/dL (ref 0.3–1.2)
Total Protein: 7 g/dL (ref 6.5–8.1)

## 2021-05-07 LAB — CBC WITH DIFFERENTIAL (CANCER CENTER ONLY)
Abs Immature Granulocytes: 0.03 10*3/uL (ref 0.00–0.07)
Basophils Absolute: 0 10*3/uL (ref 0.0–0.1)
Basophils Relative: 1 %
Eosinophils Absolute: 0.2 10*3/uL (ref 0.0–0.5)
Eosinophils Relative: 3 %
HCT: 30 % — ABNORMAL LOW (ref 36.0–46.0)
Hemoglobin: 8.4 g/dL — ABNORMAL LOW (ref 12.0–15.0)
Immature Granulocytes: 0 %
Lymphocytes Relative: 23 %
Lymphs Abs: 1.7 10*3/uL (ref 0.7–4.0)
MCH: 21.7 pg — ABNORMAL LOW (ref 26.0–34.0)
MCHC: 28 g/dL — ABNORMAL LOW (ref 30.0–36.0)
MCV: 77.5 fL — ABNORMAL LOW (ref 80.0–100.0)
Monocytes Absolute: 0.7 10*3/uL (ref 0.1–1.0)
Monocytes Relative: 10 %
Neutro Abs: 4.7 10*3/uL (ref 1.7–7.7)
Neutrophils Relative %: 63 %
Platelet Count: 199 10*3/uL (ref 150–400)
RBC: 3.87 MIL/uL (ref 3.87–5.11)
RDW: 24 % — ABNORMAL HIGH (ref 11.5–15.5)
WBC Count: 7.3 10*3/uL (ref 4.0–10.5)
nRBC: 0.4 % — ABNORMAL HIGH (ref 0.0–0.2)

## 2021-05-07 LAB — FERRITIN: Ferritin: 89 ng/mL (ref 11–307)

## 2021-05-07 LAB — IRON AND IRON BINDING CAPACITY (CC-WL,HP ONLY)
Iron: 29 ug/dL (ref 28–170)
Saturation Ratios: 6 % — ABNORMAL LOW (ref 10.4–31.8)
TIBC: 466 ug/dL — ABNORMAL HIGH (ref 250–450)
UIBC: 437 ug/dL (ref 148–442)

## 2021-05-07 MED ORDER — SODIUM CHLORIDE 0.9 % IV SOLN: INTRAVENOUS | Status: DC

## 2021-05-07 MED ORDER — SODIUM CHLORIDE 0.9% FLUSH
10.0000 mL | Freq: Once | INTRAVENOUS | Status: AC
  Administered 2021-05-07: 12:00:00 10 mL

## 2021-05-07 MED ORDER — SODIUM CHLORIDE 0.9 % IV SOLN
300.0000 mg | Freq: Once | INTRAVENOUS | Status: AC
  Administered 2021-05-07: 13:00:00 300 mg via INTRAVENOUS
  Filled 2021-05-07: qty 300

## 2021-05-07 NOTE — Patient Instructions (Signed)
[facility] MEDICAL ONCOLOGY  Discharge Instructions: °Thank you for choosing [facility] to provide your oncology and hematology care.  ° °If you have a lab appointment with the Cancer Center, please go directly to the Cancer Center and check in at the registration area. °  ° ° °We strive to give you quality time with your provider. You may need to reschedule your appointment if you arrive late (15 or more minutes).  Arriving late affects you and other patients whose appointments are after yours.  Also, if you miss three or more appointments without notifying the office, you may be dismissed from the clinic at the provider’s discretion.    °  °For prescription refill requests, have your pharmacy contact our office and allow 72 hours for refills to be completed.   ° ° °  °To help prevent nausea and vomiting after your treatment, we encourage you to take your nausea medication as directed. ° °BELOW ARE SYMPTOMS THAT SHOULD BE REPORTED IMMEDIATELY: °*FEVER GREATER THAN 100.4 F (38 °C) OR HIGHER °*CHILLS OR SWEATING °*NAUSEA AND VOMITING THAT IS NOT CONTROLLED WITH YOUR NAUSEA MEDICATION °*UNUSUAL SHORTNESS OF BREATH °*UNUSUAL BRUISING OR BLEEDING °*URINARY PROBLEMS (pain or burning when urinating, or frequent urination) °*BOWEL PROBLEMS (unusual diarrhea, constipation, pain near the anus) °TENDERNESS IN MOUTH AND THROAT WITH OR WITHOUT PRESENCE OF ULCERS (sore throat, sores in mouth, or a toothache) °UNUSUAL RASH, SWELLING OR PAIN  °UNUSUAL VAGINAL DISCHARGE OR ITCHING  ° °Items with * indicate a potential emergency and should be followed up as soon as possible or go to the Emergency Department if any problems should occur. ° °Please show the CHEMOTHERAPY ALERT CARD or IMMUNOTHERAPY ALERT CARD at check-in to the Emergency Department and triage nurse. ° °Should you have questions after your visit or need to cancel or reschedule your appointment, please contact [facility]  MEDICAL ONCOLOGY  Dept: [phone]  and follow the prompts.  Office hours are 8:00 a.m. to 4:30 p.m. Monday - Friday. Please note that voicemails left after 4:00 p.m. may not be returned until the following business day.  We are closed weekends and major holidays. You have access to a nurse at all times for urgent questions. Please call the main number to the clinic Dept: [phone] and follow the prompts. ° ° °For any non-urgent questions, you may also contact your provider using MyChart. We now offer e-Visits for anyone 18 and older to request care online for non-urgent symptoms. For details visit [email or web address]. °  °Also download the MyChart app! Go to the app store, search "MyChart", open the app, select [facility], and log in with your MyChart username and password. ° °Due to Covid, a mask is required upon entering the hospital/clinic. If you do not have a mask, one will be given to you upon arrival. For doctor visits, patients may have 1 support person aged 18 or older with them. For treatment visits, patients cannot have anyone with them due to current Covid guidelines and our immunocompromised population.  ° °

## 2021-05-11 ENCOUNTER — Telehealth: Payer: Self-pay

## 2021-05-11 NOTE — Telephone Encounter (Signed)
Called to leave Bethany [name] a message concerning her lab values and her increased ferritin from her last iron infusion.  She has two more appointments scheduled which Dr. Burr Medico advises she keep to get her iron store completely up to a higher level.  Left main number to call back with questions or concerns. Gardiner Rhyme, RN

## 2021-05-11 NOTE — Telephone Encounter (Signed)
-----   Message from Truitt Merle, MD sent at 05/09/2021 10:15 PM EST ----- Please let pt know her lab results. Her anemia and iron level improved after recent one dose iv iron, will complete the scheduled two more iv iron. Let me know if she has any questions.  Truitt Merle  05/09/2021

## 2021-05-14 ENCOUNTER — Inpatient Hospital Stay: Payer: Medicare Other

## 2021-05-14 ENCOUNTER — Other Ambulatory Visit: Payer: Self-pay

## 2021-05-14 VITALS — BP 127/48 | HR 60 | Temp 97.7°F | Resp 17

## 2021-05-14 DIAGNOSIS — D500 Iron deficiency anemia secondary to blood loss (chronic): Secondary | ICD-10-CM

## 2021-05-14 DIAGNOSIS — K5520 Angiodysplasia of colon without hemorrhage: Secondary | ICD-10-CM

## 2021-05-14 DIAGNOSIS — Q2733 Arteriovenous malformation of digestive system vessel: Secondary | ICD-10-CM | POA: Diagnosis not present

## 2021-05-14 LAB — CBC WITH DIFFERENTIAL (CANCER CENTER ONLY)
Abs Immature Granulocytes: 0.02 10*3/uL (ref 0.00–0.07)
Basophils Absolute: 0 10*3/uL (ref 0.0–0.1)
Basophils Relative: 0 %
Eosinophils Absolute: 0.2 10*3/uL (ref 0.0–0.5)
Eosinophils Relative: 3 %
HCT: 30.8 % — ABNORMAL LOW (ref 36.0–46.0)
Hemoglobin: 8.9 g/dL — ABNORMAL LOW (ref 12.0–15.0)
Immature Granulocytes: 0 %
Lymphocytes Relative: 16 %
Lymphs Abs: 1 10*3/uL (ref 0.7–4.0)
MCH: 22.4 pg — ABNORMAL LOW (ref 26.0–34.0)
MCHC: 28.9 g/dL — ABNORMAL LOW (ref 30.0–36.0)
MCV: 77.6 fL — ABNORMAL LOW (ref 80.0–100.0)
Monocytes Absolute: 0.7 10*3/uL (ref 0.1–1.0)
Monocytes Relative: 11 %
Neutro Abs: 4.3 10*3/uL (ref 1.7–7.7)
Neutrophils Relative %: 70 %
Platelet Count: 256 10*3/uL (ref 150–400)
RBC: 3.97 MIL/uL (ref 3.87–5.11)
RDW: 25.6 % — ABNORMAL HIGH (ref 11.5–15.5)
WBC Count: 6.1 10*3/uL (ref 4.0–10.5)
nRBC: 0.3 % — ABNORMAL HIGH (ref 0.0–0.2)

## 2021-05-14 LAB — CMP (CANCER CENTER ONLY)
ALT: 33 U/L (ref 0–44)
AST: 52 U/L — ABNORMAL HIGH (ref 15–41)
Albumin: 3.9 g/dL (ref 3.5–5.0)
Alkaline Phosphatase: 166 U/L — ABNORMAL HIGH (ref 38–126)
Anion gap: 8 (ref 5–15)
BUN: 25 mg/dL — ABNORMAL HIGH (ref 8–23)
CO2: 24 mmol/L (ref 22–32)
Calcium: 10.1 mg/dL (ref 8.9–10.3)
Chloride: 107 mmol/L (ref 98–111)
Creatinine: 1.82 mg/dL — ABNORMAL HIGH (ref 0.44–1.00)
GFR, Estimated: 29 mL/min — ABNORMAL LOW (ref >60)
Glucose, Bld: 131 mg/dL — ABNORMAL HIGH (ref 70–99)
Potassium: 4.2 mmol/L (ref 3.5–5.1)
Sodium: 139 mmol/L (ref 135–145)
Total Bilirubin: 0.4 mg/dL (ref 0.3–1.2)
Total Protein: 6.8 g/dL (ref 6.5–8.1)

## 2021-05-14 LAB — IRON AND IRON BINDING CAPACITY (CC-WL,HP ONLY)
Iron: 40 ug/dL (ref 28–170)
Saturation Ratios: 9 % — ABNORMAL LOW (ref 10.4–31.8)
TIBC: 424 ug/dL (ref 250–450)
UIBC: 384 ug/dL (ref 148–442)

## 2021-05-14 LAB — FERRITIN: Ferritin: 138 ng/mL (ref 11–307)

## 2021-05-14 MED ORDER — ALTEPLASE 2 MG IJ SOLR: 2.0000 mg | Freq: Once | INTRAMUSCULAR | Status: DC | PRN

## 2021-05-14 MED ORDER — SODIUM CHLORIDE 0.9% FLUSH
10.0000 mL | Freq: Once | INTRAVENOUS | Status: AC
  Administered 2021-05-14: 13:00:00 10 mL

## 2021-05-14 MED ORDER — SODIUM CHLORIDE 0.9% FLUSH
10.0000 mL | Freq: Once | INTRAVENOUS | Status: AC
  Administered 2021-05-14: 15:00:00 10 mL via INTRAVENOUS

## 2021-05-14 MED ORDER — SODIUM CHLORIDE 0.9 % IV SOLN: Freq: Once | INTRAVENOUS | Status: AC

## 2021-05-14 MED ORDER — HEPARIN SOD (PORK) LOCK FLUSH 100 UNIT/ML IV SOLN
500.0000 [IU] | Freq: Once | INTRAVENOUS | Status: AC
  Administered 2021-05-14: 15:00:00 500 [IU] via INTRAVENOUS

## 2021-05-14 MED ORDER — SODIUM CHLORIDE 0.9 % IV SOLN
300.0000 mg | Freq: Once | INTRAVENOUS | Status: AC
  Administered 2021-05-14: 14:00:00 300 mg via INTRAVENOUS
  Filled 2021-05-14: qty 300

## 2021-05-14 NOTE — Patient Instructions (Signed)

## 2021-05-18 ENCOUNTER — Other Ambulatory Visit: Payer: Self-pay

## 2021-05-18 ENCOUNTER — Ambulatory Visit (INDEPENDENT_AMBULATORY_CARE_PROVIDER_SITE_OTHER)
Admission: RE | Admit: 2021-05-18 | Discharge: 2021-05-18 | Disposition: A | Discharge status: Home / Self Care | Payer: Commercial Managed Care - HMO | Source: Ambulatory Visit | Attending: Emergency Medicine | Admitting: Emergency Medicine

## 2021-05-18 ENCOUNTER — Encounter: Payer: Self-pay | Admitting: Hematology

## 2021-05-18 DIAGNOSIS — R918 Other nonspecific abnormal finding of lung field: Secondary | ICD-10-CM | POA: Diagnosis not present

## 2021-05-26 ENCOUNTER — Other Ambulatory Visit: Payer: Self-pay

## 2021-05-26 ENCOUNTER — Inpatient Hospital Stay: Payer: Medicare Other | Attending: Hematology

## 2021-05-26 ENCOUNTER — Inpatient Hospital Stay: Payer: Medicare Other

## 2021-05-26 VITALS — BP 143/55 | HR 58 | Temp 99.2°F | Resp 17

## 2021-05-26 DIAGNOSIS — K5520 Angiodysplasia of colon without hemorrhage: Secondary | ICD-10-CM

## 2021-05-26 DIAGNOSIS — D500 Iron deficiency anemia secondary to blood loss (chronic): Secondary | ICD-10-CM | POA: Insufficient documentation

## 2021-05-26 DIAGNOSIS — Q2733 Arteriovenous malformation of digestive system vessel: Secondary | ICD-10-CM | POA: Insufficient documentation

## 2021-05-26 LAB — CBC WITH DIFFERENTIAL (CANCER CENTER ONLY)
Abs Immature Granulocytes: 0.02 10*3/uL (ref 0.00–0.07)
Basophils Absolute: 0 10*3/uL (ref 0.0–0.1)
Basophils Relative: 0 %
Eosinophils Absolute: 0.1 10*3/uL (ref 0.0–0.5)
Eosinophils Relative: 1 %
HCT: 33.3 % — ABNORMAL LOW (ref 36.0–46.0)
Hemoglobin: 9.9 g/dL — ABNORMAL LOW (ref 12.0–15.0)
Immature Granulocytes: 0 %
Lymphocytes Relative: 13 %
Lymphs Abs: 0.9 10*3/uL (ref 0.7–4.0)
MCH: 23 pg — ABNORMAL LOW (ref 26.0–34.0)
MCHC: 29.7 g/dL — ABNORMAL LOW (ref 30.0–36.0)
MCV: 77.3 fL — ABNORMAL LOW (ref 80.0–100.0)
Monocytes Absolute: 0.3 10*3/uL (ref 0.1–1.0)
Monocytes Relative: 4 %
Neutro Abs: 5.7 10*3/uL (ref 1.7–7.7)
Neutrophils Relative %: 82 %
Platelet Count: 230 10*3/uL (ref 150–400)
RBC: 4.31 MIL/uL (ref 3.87–5.11)
RDW: 24.9 % — ABNORMAL HIGH (ref 11.5–15.5)
WBC Count: 7 10*3/uL (ref 4.0–10.5)
nRBC: 0 % (ref 0.0–0.2)

## 2021-05-26 LAB — CMP (CANCER CENTER ONLY)
ALT: 77 U/L — ABNORMAL HIGH (ref 0–44)
AST: 60 U/L — ABNORMAL HIGH (ref 15–41)
Albumin: 3.9 g/dL (ref 3.5–5.0)
Alkaline Phosphatase: 246 U/L — ABNORMAL HIGH (ref 38–126)
Anion gap: 5 (ref 5–15)
BUN: 20 mg/dL (ref 8–23)
CO2: 27 mmol/L (ref 22–32)
Calcium: 10.1 mg/dL (ref 8.9–10.3)
Chloride: 107 mmol/L (ref 98–111)
Creatinine: 1.23 mg/dL — ABNORMAL HIGH (ref 0.44–1.00)
GFR, Estimated: 47 mL/min — ABNORMAL LOW (ref >60)
Glucose, Bld: 146 mg/dL — ABNORMAL HIGH (ref 70–99)
Potassium: 4.4 mmol/L (ref 3.5–5.1)
Sodium: 139 mmol/L (ref 135–145)
Total Bilirubin: 0.4 mg/dL (ref 0.3–1.2)
Total Protein: 6.8 g/dL (ref 6.5–8.1)

## 2021-05-26 LAB — IRON AND IRON BINDING CAPACITY (CC-WL,HP ONLY)
Iron: 33 ug/dL (ref 28–170)
Saturation Ratios: 8 % — ABNORMAL LOW (ref 10.4–31.8)
TIBC: 407 ug/dL (ref 250–450)
UIBC: 374 ug/dL (ref 148–442)

## 2021-05-26 LAB — FERRITIN: Ferritin: 175 ng/mL (ref 11–307)

## 2021-05-26 MED ORDER — SODIUM CHLORIDE 0.9% FLUSH
10.0000 mL | Freq: Once | INTRAVENOUS | Status: AC
  Administered 2021-05-26: 10 mL

## 2021-05-26 MED ORDER — SODIUM CHLORIDE 0.9 % IV SOLN
300.0000 mg | Freq: Once | INTRAVENOUS | Status: AC
  Administered 2021-05-26: 300 mg via INTRAVENOUS
  Filled 2021-05-26: qty 300

## 2021-05-26 MED ORDER — SODIUM CHLORIDE 0.9 % IV SOLN: Freq: Once | INTRAVENOUS | Status: AC

## 2021-05-26 MED ORDER — HEPARIN SOD (PORK) LOCK FLUSH 100 UNIT/ML IV SOLN
500.0000 [IU] | Freq: Once | INTRAVENOUS | Status: AC
  Administered 2021-05-26: 500 [IU]

## 2021-05-26 NOTE — Patient Instructions (Signed)

## 2021-05-28 ENCOUNTER — Encounter: Payer: Self-pay | Admitting: Hematology

## 2021-05-31 ENCOUNTER — Telehealth: Payer: Self-pay

## 2021-05-31 NOTE — Telephone Encounter (Signed)
Per Dr. Burr Medico, pt was told on phone that her lab results shows mildly abnormal renal and liver function but is stable overall. That her anemia has improved and iron levels are good. Made pt aware that there is no need for IV Iron at this time. Pt verbalizes understanding.

## 2021-05-31 NOTE — Telephone Encounter (Signed)
-----   Message from Truitt Merle, MD sent at 05/30/2021  9:20 PM EST ----- Please let pt know her lab results, mildly abnormal renal and liver function, overall stable. Anemia improved, iron level good, no need iv iron for now. Thanks   Truitt Merle  05/30/2021

## 2021-06-02 ENCOUNTER — Other Ambulatory Visit: Payer: Self-pay

## 2021-06-02 ENCOUNTER — Ambulatory Visit (HOSPITAL_COMMUNITY)
Admission: RE | Admit: 2021-06-02 | Discharge: 2021-06-02 | Disposition: A | Discharge status: Home / Self Care | Payer: Medicare Other | Source: Ambulatory Visit | Attending: Interventional Radiology | Admitting: Interventional Radiology

## 2021-06-02 ENCOUNTER — Ambulatory Visit (HOSPITAL_COMMUNITY): Payer: Medicare Other

## 2021-06-02 DIAGNOSIS — I671 Cerebral aneurysm, nonruptured: Secondary | ICD-10-CM | POA: Diagnosis present

## 2021-06-14 ENCOUNTER — Telehealth (HOSPITAL_COMMUNITY): Payer: Self-pay

## 2021-06-14 NOTE — Telephone Encounter (Signed)
Pt agreed to f/u in 6 months with an mra. AW 

## 2021-06-16 ENCOUNTER — Encounter: Payer: Self-pay | Admitting: Hematology

## 2021-06-22 ENCOUNTER — Encounter: Payer: Self-pay | Admitting: Emergency Medicine

## 2021-06-22 ENCOUNTER — Ambulatory Visit (INDEPENDENT_AMBULATORY_CARE_PROVIDER_SITE_OTHER): Payer: Medicare Other | Admitting: Emergency Medicine

## 2021-06-22 ENCOUNTER — Other Ambulatory Visit: Payer: Self-pay

## 2021-06-22 VITALS — BP 134/68 | HR 70 | Temp 98.2°F | Ht 65.0 in | Wt 171.8 lb

## 2021-06-22 DIAGNOSIS — J449 Chronic obstructive pulmonary disease, unspecified: Secondary | ICD-10-CM | POA: Diagnosis not present

## 2021-06-22 DIAGNOSIS — R918 Other nonspecific abnormal finding of lung field: Secondary | ICD-10-CM | POA: Diagnosis not present

## 2021-06-22 DIAGNOSIS — Z720 Tobacco use: Secondary | ICD-10-CM | POA: Diagnosis not present

## 2021-06-22 DIAGNOSIS — J310 Chronic rhinitis: Secondary | ICD-10-CM

## 2021-06-22 LAB — CBC WITH DIFFERENTIAL/PLATELET
Basophils Absolute: 0.1 10*3/uL (ref 0.0–0.1)
Basophils Relative: 1.9 % (ref 0.0–3.0)
Eosinophils Absolute: 0.1 10*3/uL (ref 0.0–0.7)
Eosinophils Relative: 1 % (ref 0.0–5.0)
HCT: 37.9 % (ref 36.0–46.0)
Hemoglobin: 11.3 g/dL — ABNORMAL LOW (ref 12.0–15.0)
Lymphocytes Relative: 12.6 % (ref 12.0–46.0)
Lymphs Abs: 0.8 10*3/uL (ref 0.7–4.0)
MCHC: 29.8 g/dL — ABNORMAL LOW (ref 30.0–36.0)
MCV: 77.5 fl — ABNORMAL LOW (ref 78.0–100.0)
Monocytes Absolute: 0.5 10*3/uL (ref 0.1–1.0)
Monocytes Relative: 7.1 % (ref 3.0–12.0)
Neutro Abs: 5.1 10*3/uL (ref 1.4–7.7)
Neutrophils Relative %: 77.4 % — ABNORMAL HIGH (ref 43.0–77.0)
Platelets: 211 10*3/uL (ref 150.0–400.0)
RBC: 4.89 Mil/uL (ref 3.87–5.11)
RDW: 21.8 % — ABNORMAL HIGH (ref 11.5–15.5)
WBC: 6.6 10*3/uL (ref 4.0–10.5)

## 2021-06-22 NOTE — Patient Instructions (Signed)
We will plan to repeat your CT scan of the chest in July 2023. We will perform lab work today. Work hard to decrease your cigarette smoking. We can consider starting an anti-inflammatory nasal spray in the future if you continue to have problems with drainage and cough. Follow Dr. Lamonte Sakai in July after your CT so we can review the results together.

## 2021-06-22 NOTE — Progress Notes (Signed)
Subjective:    Patient ID: Bethany [name], female    DOB: Feb 16, 1951, 71 y.o.   MRN: [id number]  HPI 71 year old smoker ( 20 pack years) with a history of mild COPD, diabetes, GERD, hypertension, CVA, chronic renal insufficiency, remote hx descending aortic dissection or aneurism > lost her L kidney.  She is followed by Dr. Burr Medico for iron deficiency anemia.  She is here today to evaluate pulmonary nodular disease.  She had a lung cancer screening CT 07/12/2019 that identified nodular disease, progressive on repeat 07/13/2020.  PET scan showed hypermetabolism and the dominant left upper lobe pulmonary nodule.  Biopsy was attempted by IR but location was difficult and was ultimately abandoned.  A repeat CT chest 12/24/2020 showed a decrease in size of the left upper lobe nodule.  She denies any cough, dyspnea. She is not on scheduled BD. Very rare albuterol use.   ROV 06/22/21 --this is a follow-up visit for 71 year old woman with a history of tobacco use and mild COPD.  PMH: Diabetes, GERD, hypertension, prior CVA, CRI.  I saw her in 04/2021 to follow a left upper lobe pulmonary nodule.  Originally discovered on lung cancer screening and then hypermetabolic on PET.  Biopsy in IR was attempted but could not be completed due to location.  Repeat CT chest 12/2020 showed that the nodule had decreased in size.  We planned a repeat CT for January.  She is feeling well. Denies any SOB or wheeze, cough. She notes that she has 2 family members who have had sarcoid. She has never been dx w inflammatory disease herself.   Super D CT chest 05/18/2021 reviewed by me, shows that 1.1 cm left upper lobe groundglass pulmonary nodule is stable in size and appearance.  There is been resolution of previously identified nodules in the left superior segment, right upper lobe.  There is a new 4 mm right upper lobe pulmonary nodule noted on the scan.   Review of Systems As per [facility]  Past Medical History:  Diagnosis Date   Acute  renal failure (HCC)    Anemia    Arthritis    COPD (chronic obstructive pulmonary disease) (HCC)    Diabetes mellitus without complication (HCC)    type II    GERD (gastroesophageal reflux disease)    Hypercalcemia    Hyperlipidemia    Hypertension    Pneumonia    Renal disorder    Single kidney    Stroke [facility])    July 2021   Thrombosis    Tobacco abuse      Family History  Problem Relation Age of Onset   Hypertension Mother    Diabetes Father    Hypertension Brother    Breast cancer Maternal Aunt        22s   Breast cancer Maternal Aunt 75       colon cancer     Social History   Socioeconomic History   Marital status: Single    Spouse name: Not on file   Number of children: 3   Years of education: Not on file   Highest education level: Not on file  Occupational History   Occupation: retired   Tobacco Use   Smoking status: Every Day    Packs/day: 0.25    Years: 40.00    Pack years: 10.00    Types: Cigarettes   Smokeless tobacco: Never   Tobacco comments:    6 cigarettes smoked daily ARJ, RN 04/21/22  Vaping Use  Vaping Use: Never used  Substance and Sexual Activity   Alcohol use: Yes    Comment: socially   Drug use: No   Sexual activity: Not on file  Other Topics Concern   Not on file  Social History Narrative   Lives with daughter    Dorie Rank from [place] Determinants of Health   Financial Resource Strain: Not on file  Food Insecurity: Not on file  Transportation Needs: Not on file  Physical Activity: Not on file  Stress: Not on file  Social Connections: Not on file  Intimate Partner Violence: Not on file     Allergies  Allergen Reactions   Contrast Media [Iodinated Contrast Media]     Renal hypertension per physician   Other Other (See Comments)     Outpatient Medications Prior to Visit  Medication Sig Dispense Refill   allopurinol (ZYLOPRIM) 100 MG tablet Take 100 mg by mouth at bedtime.     amLODipine (NORVASC) 10 MG tablet Take  10 mg by mouth daily.     aspirin EC 81 MG EC tablet Take 1 tablet (81 mg total) by mouth daily. Swallow whole. 30 tablet 11   baclofen (LIORESAL) 10 MG tablet Take 10 mg by mouth 3 (three) times daily as needed for muscle spasms.     clindamycin (CLEOCIN) 300 MG capsule Take 300 mg by mouth 3 (three) times daily.     Dulaglutide (TRULICITY) 1.5 FG/1.8EX SOPN Inject 1.5 mg into the skin every Monday.      ezetimibe (ZETIA) 10 MG tablet Take 10 mg by mouth daily.     feeding supplement, ENSURE ENLIVE, (ENSURE ENLIVE) LIQD Take 237 mLs by mouth 2 (two) times daily between meals. 237 mL 12   furosemide (LASIX) 40 MG tablet Take 40 mg by mouth as needed.     gabapentin (NEURONTIN) 100 MG capsule Take 2 capsules (200 mg total) by mouth 4 (four) times daily. 240 capsule 3   insulin glargine, 1 Unit Dial, (TOUJEO SOLOSTAR) 300 UNIT/ML Solostar Pen Inject 20 Units into the skin at bedtime.      insulin lispro (HUMALOG) 100 UNIT/ML KwikPen INJECT 5 UNITS INTO SKIN BEFORE EACH MEAL WHEN BLOOD SUGAR IS ABOVE 200     Ipratropium-Albuterol (COMBIVENT) 20-100 MCG/ACT AERS respimat Inhale 1 puff into the lungs every 6 (six) hours as needed for wheezing or shortness of breath. 1 Inhaler 0   levothyroxine (SYNTHROID) 75 MCG tablet Take 75 mcg by mouth daily.     lidocaine-prilocaine (EMLA) cream Apply 1 application topically as needed. 30 g 1   metoprolol (LOPRESSOR) 50 MG tablet Take 50 mg by mouth 2 (two) times daily.      Multiple Vitamin (MULTIVITAMIN WITH MINERALS) TABS tablet Take 1 tablet by mouth daily.     NARCAN 4 MG/0.1ML LIQD nasal spray kit Place 1 spray into the nose as needed for opioid reversal.     oxyCODONE-acetaminophen (PERCOCET) 10-325 MG tablet Take 1 tablet by mouth every 6 (six) hours as needed (breakthrough pain).      pantoprazole (PROTONIX) 40 MG tablet Take 40 mg by mouth every morning.     pregabalin (LYRICA) 25 MG capsule Take 25 mg by mouth 2 (two) times daily.      prochlorperazine  (COMPAZINE) 5 MG tablet Take 1 tablet (5 mg total) by mouth every 6 (six) hours as needed for nausea or vomiting. 30 tablet 0   sennosides-docusate sodium (SENOKOT-S) 8.6-50 MG tablet Take 1 tablet  by mouth at bedtime.     TRUEPLUS PEN NEEDLES 32G X 4 MM MISC      Vitamin D, Ergocalciferol, (DRISDOL) 1.25 MG (50000 UNIT) CAPS capsule Take 50,000 Units by mouth every Monday.     XTAMPZA ER 27 MG C12A Take 27 mg by mouth 2 (two) times daily.      losartan (COZAAR) 25 MG tablet Take 1 tablet (25 mg total) by mouth daily. 30 tablet 11   No facility-administered medications prior to visit.          Objective:   Physical Exam Vitals:   06/22/21 1201  BP: 134/68  Pulse: 70  Temp: 98.2 F (36.8 C)  TempSrc: Oral  SpO2: 97%  Weight: 171 lb 12.8 oz (77.9 kg)  Height: '5\' 5"'  (1.651 m)   Gen: Pleasant, well-nourished, in no distress,  normal affect  ENT: No lesions,  mouth clear,  oropharynx clear, no postnasal drip  Neck: No JVD, no stridor  Lungs: No use of accessory muscles, no crackles or wheezing on normal respiration, no wheeze on forced expiration  Cardiovascular: RRR, heart sounds normal, no murmur or gallops, no peripheral edema  Musculoskeletal: No deformities, no cyanosis or clubbing  Neuro: alert, awake, non focal  Skin: Warm, no lesions or rash      Assessment & Plan:  COPD (chronic obstructive pulmonary disease) (HCC) Presumed obstruction.  Rare albuterol use.  She is not on scheduled bronchodilator therapy.  We could consider this going forward.  Tobacco use Discussed cessation with her today.  Multiple lung nodules Waxing and waning nature.  Decreased in size compared with 09/2020.  Most recent scan stable.  Most notable nodule is groundglass nodule in the left upper lobe.  This will need to be followed for at least 5 years.  Next follow-up in 6 months.  Etiology of the nodules unclear, consider inflammatory process.  Will check ANA, ANCA, ACE level, CBC with  differential today.  We will plan to repeat your CT scan of the chest in July 2023. We will perform lab work today. Follow Dr. Lamonte Sakai in July after your CT so we can review the results together.  Chronic rhinitis With associated cough.  Discussed this with her today.  Recommended that we consider a nasal steroid going forward if symptoms continue   Baltazar Apo, MD, PhD 06/22/2021, 1:29 PM [facility] [phone] or if no answer before 7:00PM call [phone] For any issues after 7:00PM please call eLink [phone]

## 2021-06-22 NOTE — Assessment & Plan Note (Signed)
Discussed cessation with her today 

## 2021-06-22 NOTE — Assessment & Plan Note (Signed)
Presumed obstruction.  Rare albuterol use.  She is not on scheduled bronchodilator therapy.  We could consider this going forward.

## 2021-06-22 NOTE — Assessment & Plan Note (Signed)
With associated cough.  Discussed this with her today.  Recommended that we consider a nasal steroid going forward if symptoms continue

## 2021-06-22 NOTE — Assessment & Plan Note (Signed)
Waxing and waning nature.  Decreased in size compared with 09/2020.  Most recent scan stable.  Most notable nodule is groundglass nodule in the left upper lobe.  This will need to be followed for at least 5 years.  Next follow-up in 6 months.  Etiology of the nodules unclear, consider inflammatory process.  Will check ANA, ANCA, ACE level, CBC with differential today.  We will plan to repeat your CT scan of the chest in July 2023. We will perform lab work today. Follow Dr. Lamonte Sakai in July after your CT so we can review the results together.

## 2021-06-23 ENCOUNTER — Inpatient Hospital Stay: Payer: Medicare Other | Attending: Hematology

## 2021-06-23 ENCOUNTER — Encounter: Payer: Self-pay | Admitting: Hematology

## 2021-06-23 ENCOUNTER — Inpatient Hospital Stay (HOSPITAL_BASED_OUTPATIENT_CLINIC_OR_DEPARTMENT_OTHER): Payer: Medicare Other | Admitting: Hematology

## 2021-06-23 ENCOUNTER — Inpatient Hospital Stay: Payer: Medicare Other

## 2021-06-23 VITALS — BP 126/49 | HR 51 | Temp 98.9°F | Resp 18 | Ht 65.0 in | Wt 174.8 lb

## 2021-06-23 DIAGNOSIS — N183 Chronic kidney disease, stage 3 unspecified: Secondary | ICD-10-CM | POA: Diagnosis not present

## 2021-06-23 DIAGNOSIS — D500 Iron deficiency anemia secondary to blood loss (chronic): Secondary | ICD-10-CM

## 2021-06-23 DIAGNOSIS — N6315 Unspecified lump in the right breast, overlapping quadrants: Secondary | ICD-10-CM

## 2021-06-23 DIAGNOSIS — K5520 Angiodysplasia of colon without hemorrhage: Secondary | ICD-10-CM

## 2021-06-23 DIAGNOSIS — Q2733 Arteriovenous malformation of digestive system vessel: Secondary | ICD-10-CM | POA: Diagnosis present

## 2021-06-23 LAB — CMP (CANCER CENTER ONLY)
ALT: 105 U/L — ABNORMAL HIGH (ref 0–44)
AST: 65 U/L — ABNORMAL HIGH (ref 15–41)
Albumin: 3.9 g/dL (ref 3.5–5.0)
Alkaline Phosphatase: 256 U/L — ABNORMAL HIGH (ref 38–126)
Anion gap: 5 (ref 5–15)
BUN: 25 mg/dL — ABNORMAL HIGH (ref 8–23)
CO2: 26 mmol/L (ref 22–32)
Calcium: 10.3 mg/dL (ref 8.9–10.3)
Chloride: 107 mmol/L (ref 98–111)
Creatinine: 1.4 mg/dL — ABNORMAL HIGH (ref 0.44–1.00)
GFR, Estimated: 40 mL/min — ABNORMAL LOW (ref >60)
Glucose, Bld: 161 mg/dL — ABNORMAL HIGH (ref 70–99)
Potassium: 4.3 mmol/L (ref 3.5–5.1)
Sodium: 138 mmol/L (ref 135–145)
Total Bilirubin: 0.2 mg/dL — ABNORMAL LOW (ref 0.3–1.2)
Total Protein: 6.6 g/dL (ref 6.5–8.1)

## 2021-06-23 LAB — CBC WITH DIFFERENTIAL (CANCER CENTER ONLY)
Abs Immature Granulocytes: 0.02 10*3/uL (ref 0.00–0.07)
Basophils Absolute: 0 10*3/uL (ref 0.0–0.1)
Basophils Relative: 1 %
Eosinophils Absolute: 0.2 10*3/uL (ref 0.0–0.5)
Eosinophils Relative: 3 %
HCT: 34.8 % — ABNORMAL LOW (ref 36.0–46.0)
Hemoglobin: 10.2 g/dL — ABNORMAL LOW (ref 12.0–15.0)
Immature Granulocytes: 0 %
Lymphocytes Relative: 22 %
Lymphs Abs: 1.4 10*3/uL (ref 0.7–4.0)
MCH: 23 pg — ABNORMAL LOW (ref 26.0–34.0)
MCHC: 29.3 g/dL — ABNORMAL LOW (ref 30.0–36.0)
MCV: 78.6 fL — ABNORMAL LOW (ref 80.0–100.0)
Monocytes Absolute: 0.6 10*3/uL (ref 0.1–1.0)
Monocytes Relative: 10 %
Neutro Abs: 4.2 10*3/uL (ref 1.7–7.7)
Neutrophils Relative %: 64 %
Platelet Count: 232 10*3/uL (ref 150–400)
RBC: 4.43 MIL/uL (ref 3.87–5.11)
RDW: 19.9 % — ABNORMAL HIGH (ref 11.5–15.5)
WBC Count: 6.4 10*3/uL (ref 4.0–10.5)
nRBC: 0 % (ref 0.0–0.2)

## 2021-06-23 LAB — IRON AND IRON BINDING CAPACITY (CC-WL,HP ONLY)
Iron: 49 ug/dL (ref 28–170)
Saturation Ratios: 12 % (ref 10.4–31.8)
TIBC: 400 ug/dL (ref 250–450)
UIBC: 351 ug/dL (ref 148–442)

## 2021-06-23 LAB — FERRITIN: Ferritin: 83 ng/mL (ref 11–307)

## 2021-06-23 MED ORDER — HEPARIN SOD (PORK) LOCK FLUSH 100 UNIT/ML IV SOLN
500.0000 [IU] | Freq: Once | INTRAVENOUS | Status: AC
  Administered 2021-06-23: 500 [IU]

## 2021-06-23 MED ORDER — SODIUM CHLORIDE 0.9% FLUSH
10.0000 mL | Freq: Once | INTRAVENOUS | Status: AC
  Administered 2021-06-23: 10 mL

## 2021-06-23 NOTE — Progress Notes (Signed)
[place]   Telephone:([phone]) [phone] Fax:(336) [phone]   Clinic Follow up Note   Patient Care Team: Arthur Holms, NP as PCP - General (Nurse Practitioner) Audie Box, Cassie Freer, MD as PCP - Cardiology (Cardiology) Truitt Merle, MD as Consulting Physician (Hematology) Angelia Mould, MD as Consulting Physician (Vascular Surgery) Koleen Distance, MD as Referring Physician (Gastroenterology)  Date of Service:  06/23/2021  CHIEF COMPLAINT: f/u of anemia  CURRENT THERAPY:  IV Feraheme as needed (if ferritin <100). Changed to IV Venofer on 01/02/19. Most recent dose 05/26/21  ASSESSMENT & PLAN:  Bethany [name] is a 71 y.o. female with   1. Iron deficient anemia, secondary to chronic blood loss from GI AVM -Pt has intestinal AVM history with associated chronic GI blood loss resulting in iron deficiency and blood loss anemia. She has required blood transfusions in the past in 2013 and in 2018 and frequent hospital admission for anemia. She does not meet the diagnosis criteria for hereditary hemorrhagic telangiectasia. -Her last colonoscopy/endoscopy was 04/2017.  -She is not on oral iron, due to the lack of benefit.  -Currently being treated with IV Iron to prevent significant anemia with goal of Ferritin 100-200 range. She was switched to IV Venofer on 01/02/19 due to her insurance coverage. Increased to $RemoveBefo'400mg'couqLaSiqEw$  with each infusion from 02/14/20. Due to recent GI bleeding, we increased Venofer to every 1-2 weeks on 05/22/20. Given severe anemia, Plavix has been held and continues Aspirin. -Her anemia and iron deficiency initially improved since she stopped her Plavix, last IV iron on 12/02/20. However, she required IV Venofer again starting in mid 04/2021. -labs reviewed, her hgb is 10.2 today. We will await her ferritin level to determine if she needs IV iron.   2. Multiple lung nodules, Current Smoker with COPD/Emphysema -She has been smoking intermittently for 40 years. Then  increased smoking daily after her mother passed in 2021.  -nodules initially seen on screening chest CT 07/12/19.  -Repeat 07/13/20 showed new nodules. PET scan on 07/28/20 showed mild hypermetabolism to dominant LUL nodule. Biopsy of left lung was attempted by Dr Pascal Lux but held given difficult location. -she is now followed by Dr. Lamonte Sakai. Most recent CT 05/18/21 shows waxing and waning of pulmonary nodules. -She continues to smoke. I have previously discussed complete smoking cessation.    3. Intraductal papilloma, new breast pain -Pt notes h/o benign right breast fibroadenoma, biopsied in 2014, stable on 05/2017 mammogram. -PET 07/28/20 showed nonspecific RIGHT breast mass with low level FDG uptake  -biopsy on 08/25/20 showed ductal papilloma with usual ductal hyperplasia -right lumpectomy on 01/25/21 with Dr. Ninfa Linden showed intraductal papilloma, partially fibrotic, and complex sclerosing lesion. -she reports new right breast pain that has been going on for about a month. She has erythema and tenderness, as well as a palpable nodule to the incision, which could be scar tissue. I will order mammogram and right breast US; she will be due for annual mammogram anyway in late 07/2021.   4. Chronic GI Bleeding, intermittent epistaxis, constipation, N&V -Has intestinal AVM history -most recent colonoscopy on 03/06/19 under Dr. Alan Ripper showed mild diverticulosis and small internal hemorrhoids, all benign with no obvious signs of bleeding at that time. Pathology showed two tubular adenomas, recall due 2025. -She does not meet the diagnosis criteria for hereditary hemorrhagic telangiectasia (HHT)     5. H/o of arterial thrombosis, Ischemic stroke 11/2019, Left Brain Aneurysm (49mm and 53mm) -S/p unilateral nephrectomy due to damage from arterial thrombosis -Managed  by vascular physician, Dr. Doren Custard and neurologist Dr Leonie Man  -Previous work-up for lupus anticoagulant and antiphospholipid syndrome were negative, normal  protein S and C level. -On 02/06/20 Dr Allegra Grana found a second brain aneurysm. She underwent embolization surgery on 05/04/20  -she still has some mild speech deficit (difficulty finding her words) -most recent MRI/MRA head on 06/02/21 was negative/stable.   6. COPD, HTN, CKD stage III, DM with Peripheral Neuropathy, Chronic Back Pain -She is currently on $RemoveBefo'600mg'apPLvXnAStq$  Gabapentin 4 times a day.  -Continue to f/u with PCP and [facility] with short and long acting oxycodone.     PLAN: -IV Venofer [phone] mg if ferritin <100 -mammogram and [place] of right breast in 2-4 weeks due to her breast pain  -lab monthly x3 for anemia f/u  -f/u in 3 months   No problem-specific Assessment & Plan notes found for this encounter.   INTERVAL HISTORY:  Bethany [name] is here for a follow up of anemia. She was last seen by me on 04/28/21. She presents to the clinic alone. She reports new right breast pain, which is the breast she previously had surgery. She notes this has been going on for the last month. She reports tenderness, bothered by even her shirt. She notes her postop progress was complicated by delayed dealing.   All other systems were reviewed with the patient and are negative.  MEDICAL HISTORY:  Past Medical History:  Diagnosis Date   Acute renal failure (HCC)    Anemia    Arthritis    COPD (chronic obstructive pulmonary disease) (HCC)    Diabetes mellitus without complication (HCC)    type II    GERD (gastroesophageal reflux disease)    Hypercalcemia    Hyperlipidemia    Hypertension    Pneumonia    Renal disorder    Single kidney    Stroke [facility])    July 2021   Thrombosis    Tobacco abuse     SURGICAL HISTORY: Past Surgical History:  Procedure Laterality Date   ABDOMINAL HYSTERECTOMY     blood clots removed from descending aorta      BREAST LUMPECTOMY WITH RADIOACTIVE SEED LOCALIZATION Right 01/25/2021   Procedure: RIGHT BREAST LUMPECTOMY WITH RADIOACTIVE SEED  LOCALIZATION;  Surgeon: Coralie Keens, MD;  Location: [place];  Service: General;  Laterality: Right;   CHOLECYSTECTOMY     COLONOSCOPY     COLONOSCOPY WITH PROPOFOL N/A 04/17/2017   Procedure: COLONOSCOPY WITH PROPOFOL;  Surgeon: Wilford Corner, MD;  Location: [facility];  Service: Endoscopy;  Laterality: N/A;   ESOPHAGOGASTRODUODENOSCOPY (EGD) WITH PROPOFOL N/A 04/17/2017   Procedure: ESOPHAGOGASTRODUODENOSCOPY (EGD) WITH PROPOFOL;  Surgeon: Wilford Corner, MD;  Location: [facility];  Service: Endoscopy;  Laterality: N/A;   IR 3D INDEPENDENT WKST  05/04/2020   IR ANGIO INTRA EXTRACRAN SEL COM CAROTID INNOMINATE BILAT MOD SED  02/06/2020   IR ANGIO INTRA EXTRACRAN SEL INTERNAL CAROTID UNI L MOD SED  05/04/2020   IR ANGIO VERTEBRAL SEL VERTEBRAL BILAT MOD SED  02/06/2020   IR ANGIOGRAM FOLLOW UP STUDY  05/04/2020   IR IMAGING GUIDED PORT INSERTION  05/01/2019   IR NEURO EACH ADD'L AFTER BASIC UNI LEFT (MS)  05/04/2020   IR RADIOLOGIST EVAL & MGMT  05/28/2020   IR TRANSCATH/EMBOLIZ  05/04/2020   IR US GUIDE VASC ACCESS RIGHT  02/06/2020   IR US GUIDE VASC ACCESS RIGHT  05/04/2020   LESION REMOVAL Left 01/25/2021   Procedure: ABNORMAL SKIN  LESION REMOVAL LEFT ABDOMINAL WALL;  Surgeon: Coralie Keens, MD;  Location: [place];  Service: General;  Laterality: Left;   NEPHRECTOMY RECIPIENT     RADIOLOGY WITH ANESTHESIA N/A 05/04/2020   Procedure: Sheppard Plumber;  Surgeon: Luanne Bras, MD;  Location: [place];  Service: Radiology;  Laterality: N/A;    I have reviewed the social history and family history with the patient and they are unchanged from previous note.  ALLERGIES:  is allergic to contrast media [iodinated contrast media] and other.  MEDICATIONS:  Current Outpatient Medications  Medication Sig Dispense Refill   allopurinol (ZYLOPRIM) 100 MG tablet Take 100 mg by mouth at bedtime.     amLODipine (NORVASC) 10 MG tablet Take 10 mg by  mouth daily.     aspirin EC 81 MG EC tablet Take 1 tablet (81 mg total) by mouth daily. Swallow whole. 30 tablet 11   baclofen (LIORESAL) 10 MG tablet Take 10 mg by mouth 3 (three) times daily as needed for muscle spasms.     clindamycin (CLEOCIN) 300 MG capsule Take 300 mg by mouth 3 (three) times daily.     Dulaglutide (TRULICITY) 1.5 DQ/2.2WL SOPN Inject 1.5 mg into the skin every Monday.      ezetimibe (ZETIA) 10 MG tablet Take 10 mg by mouth daily.     feeding supplement, ENSURE ENLIVE, (ENSURE ENLIVE) LIQD Take 237 mLs by mouth 2 (two) times daily between meals. 237 mL 12   furosemide (LASIX) 40 MG tablet Take 40 mg by mouth as needed.     gabapentin (NEURONTIN) 100 MG capsule Take 2 capsules (200 mg total) by mouth 4 (four) times daily. 240 capsule 3   insulin glargine, 1 Unit Dial, (TOUJEO SOLOSTAR) 300 UNIT/ML Solostar Pen Inject 20 Units into the skin at bedtime.      insulin lispro (HUMALOG) 100 UNIT/ML KwikPen INJECT 5 UNITS INTO SKIN BEFORE EACH MEAL WHEN BLOOD SUGAR IS ABOVE 200     Ipratropium-Albuterol (COMBIVENT) 20-100 MCG/ACT AERS respimat Inhale 1 puff into the lungs every 6 (six) hours as needed for wheezing or shortness of breath. 1 Inhaler 0   levothyroxine (SYNTHROID) 75 MCG tablet Take 75 mcg by mouth daily.     lidocaine-prilocaine (EMLA) cream Apply 1 application topically as needed. 30 g 1   losartan (COZAAR) 25 MG tablet Take 1 tablet (25 mg total) by mouth daily. 30 tablet 11   metoprolol (LOPRESSOR) 50 MG tablet Take 50 mg by mouth 2 (two) times daily.      Multiple Vitamin (MULTIVITAMIN WITH MINERALS) TABS tablet Take 1 tablet by mouth daily.     NARCAN 4 MG/0.1ML LIQD nasal spray kit Place 1 spray into the nose as needed for opioid reversal.     oxyCODONE-acetaminophen (PERCOCET) 10-325 MG tablet Take 1 tablet by mouth every 6 (six) hours as needed (breakthrough pain).      pantoprazole (PROTONIX) 40 MG tablet Take 40 mg by mouth every morning.     pregabalin  (LYRICA) 25 MG capsule Take 25 mg by mouth 2 (two) times daily.      prochlorperazine (COMPAZINE) 5 MG tablet Take 1 tablet (5 mg total) by mouth every 6 (six) hours as needed for nausea or vomiting. 30 tablet 0   sennosides-docusate sodium (SENOKOT-S) 8.6-50 MG tablet Take 1 tablet by mouth at bedtime.     TRUEPLUS PEN NEEDLES 32G X 4 MM MISC      Vitamin D, Ergocalciferol, (DRISDOL) 1.25 MG (50000 UNIT) CAPS  capsule Take 50,000 Units by mouth every Monday.     XTAMPZA ER 27 MG C12A Take 27 mg by mouth 2 (two) times daily.      No current facility-administered medications for this visit.    PHYSICAL EXAMINATION: ECOG PERFORMANCE STATUS: 1 - Symptomatic but completely ambulatory  Vitals:   06/23/21 1219  BP: (!) 126/49  Pulse: (!) 51  Resp: 18  Temp: 98.9 F (37.2 C)  SpO2: 100%   Wt Readings from Last 3 Encounters:  06/23/21 174 lb 12.8 oz (79.3 kg)  06/22/21 171 lb 12.8 oz (77.9 kg)  04/30/21 176 lb 6.4 oz (80 kg)     GENERAL:alert, no distress and comfortable SKIN: skin color, texture, turgor are normal, no rashes or significant lesions EYES: normal, Conjunctiva are pink and non-injected, sclera clear  NECK: supple, thyroid normal size, non-tender, without nodularity LYMPH:  no palpable lymphadenopathy in the cervical, axillary  LUNGS: clear to auscultation and percussion with normal breathing effort HEART: regular rate & rhythm and no murmurs and no lower extremity edema ABDOMEN:abdomen soft, non-tender and normal bowel sounds Musculoskeletal:no cyanosis of digits and no clubbing  NEURO: alert & oriented x 3 with fluent speech, no focal motor/sensory deficits BREAST: no right breast erythema, mild lymphedema around nipple, there is a 2 to 3 cm lump underneath the incision around the nipple, tender to palpation.  Left breast exam was benign, no axillary adenopathy.  LABORATORY DATA:  I have reviewed the data as listed CBC Latest Ref Rng & Units 06/23/2021 06/22/2021 05/26/2021   WBC 4.0 - 10.5 K/uL 6.4 6.6 7.0  Hemoglobin 12.0 - 15.0 g/dL 10.2(L) 11.3(L) 9.9(L)  Hematocrit 36.0 - 46.0 % 34.8(L) 37.9 33.3(L)  Platelets 150 - 400 K/uL 232 211.0 230     CMP Latest Ref Rng & Units 06/23/2021 05/26/2021 05/14/2021  Glucose 70 - 99 mg/dL 161(H) 146(H) 131(H)  BUN 8 - 23 mg/dL 25(H) 20 25(H)  Creatinine 0.44 - 1.00 mg/dL 1.40(H) 1.23(H) 1.82(H)  Sodium 135 - 145 mmol/L 138 139 139  Potassium 3.5 - 5.1 mmol/L 4.3 4.4 4.2  Chloride 98 - 111 mmol/L 107 107 107  CO2 22 - 32 mmol/L _0 Calcium 8.9 - 10.3 mg/dL 10.3 10.1 10.1  Total Protein 6.5 - 8.1 g/dL 6.6 6.8 6.8  Total Bilirubin 0.3 - 1.2 mg/dL 0.2(L) 0.4 0.4  Alkaline Phos 38 - 126 U/L 256(H) 246(H) 166(H)  AST 15 - 41 U/L 65(H) 60(H) 52(H)  ALT 0 - 44 U/L 105(H) 77(H) 33      RADIOGRAPHIC STUDIES: I have personally reviewed the radiological images as listed and agreed with the findings in the report. No results found.    Orders Placed This Encounter  Procedures   MM DIAG BREAST TOMO BILATERAL    Standing Status:   Future    Standing Expiration Date:   06/23/2022    Order Specific Question:   Reason for Exam (SYMPTOM  OR DIAGNOSIS REQUIRED)    Answer:   right breast pain and lump at incision site, previous roght breast surgery in 01/2021    Order Specific Question:   Preferred imaging location?    Answer:   [facility]   US BREAST LTD UNI RIGHT INC AXILLA    Standing Status:   Future    Standing Expiration Date:   06/23/2022    Order Specific Question:   Reason for Exam (SYMPTOM  OR DIAGNOSIS REQUIRED)    Answer:   rigth breast  lump    Order Specific Question:   Preferred imaging location?    Answer:   [facility]   All questions were answered. The patient knows to call the clinic with any problems, questions or concerns. No barriers to learning was detected. The total time spent in the appointment was 30 minutes.     Truitt Merle, MD 06/23/2021   I, Wilburn Mylar, am acting as scribe  for Truitt Merle, MD.   I have reviewed the above documentation for accuracy and completeness, and I agree with the above.

## 2021-06-25 LAB — ANCA SCREEN W REFLEX TITER: ANCA Screen: NEGATIVE

## 2021-06-25 LAB — ANTI-NUCLEAR AB-TITER (ANA TITER): ANA Titer 1: 1:40 {titer} — ABNORMAL HIGH

## 2021-06-25 LAB — ANGIOTENSIN CONVERTING ENZYME: Angiotensin-Converting Enzyme: 32 U/L (ref 9–67)

## 2021-06-25 LAB — ANA: Anti Nuclear Antibody (ANA): POSITIVE — AB

## 2021-06-25 LAB — RHEUMATOID FACTOR: Rheumatoid fact SerPl-aCnc: <14 IU/mL (ref <14)

## 2021-06-29 ENCOUNTER — Telehealth: Payer: Self-pay | Admitting: Hematology

## 2021-06-29 ENCOUNTER — Telehealth: Payer: Self-pay

## 2021-06-29 ENCOUNTER — Other Ambulatory Visit: Payer: Self-pay

## 2021-06-29 NOTE — Telephone Encounter (Signed)
Spoke with pt regarding lab results drawn on 06/23/2021.  Dr. Burr Medico would like for pt to have 2 treatments of IV Iron (Venofer 300mg ) d/t Ferritin lab below goal of 100.  Scheduling had contacted pt to schedule IV Iron infusions on 07/02/2021 and 07/09/2021.  Pt confirmed appt dates and times while on the telephone with this RN.  Pt had no further questions or concerns at this time.

## 2021-06-29 NOTE — Telephone Encounter (Signed)
Sch per 2/14 inbasket, pt aware °

## 2021-06-29 NOTE — Telephone Encounter (Signed)
See previous telephone note. 

## 2021-06-30 ENCOUNTER — Ambulatory Visit: Payer: Medicare Other | Admitting: Hematology

## 2021-07-02 ENCOUNTER — Other Ambulatory Visit: Payer: Self-pay

## 2021-07-02 ENCOUNTER — Inpatient Hospital Stay: Payer: Medicare Other

## 2021-07-02 VITALS — BP 161/57 | HR 63 | Temp 98.8°F | Resp 18

## 2021-07-02 DIAGNOSIS — D500 Iron deficiency anemia secondary to blood loss (chronic): Secondary | ICD-10-CM

## 2021-07-02 DIAGNOSIS — K5520 Angiodysplasia of colon without hemorrhage: Secondary | ICD-10-CM

## 2021-07-02 DIAGNOSIS — Q2733 Arteriovenous malformation of digestive system vessel: Secondary | ICD-10-CM | POA: Diagnosis not present

## 2021-07-02 LAB — CBC WITH DIFFERENTIAL (CANCER CENTER ONLY)
Abs Immature Granulocytes: 0.03 10*3/uL (ref 0.00–0.07)
Basophils Absolute: 0 10*3/uL (ref 0.0–0.1)
Basophils Relative: 0 %
Eosinophils Absolute: 0.1 10*3/uL (ref 0.0–0.5)
Eosinophils Relative: 1 %
HCT: 33 % — ABNORMAL LOW (ref 36.0–46.0)
Hemoglobin: 10 g/dL — ABNORMAL LOW (ref 12.0–15.0)
Immature Granulocytes: 0 %
Lymphocytes Relative: 15 %
Lymphs Abs: 1.1 10*3/uL (ref 0.7–4.0)
MCH: 23.3 pg — ABNORMAL LOW (ref 26.0–34.0)
MCHC: 30.3 g/dL (ref 30.0–36.0)
MCV: 76.9 fL — ABNORMAL LOW (ref 80.0–100.0)
Monocytes Absolute: 0.5 10*3/uL (ref 0.1–1.0)
Monocytes Relative: 6 %
Neutro Abs: 5.8 10*3/uL (ref 1.7–7.7)
Neutrophils Relative %: 78 %
Platelet Count: 214 10*3/uL (ref 150–400)
RBC: 4.29 MIL/uL (ref 3.87–5.11)
RDW: 18.9 % — ABNORMAL HIGH (ref 11.5–15.5)
WBC Count: 7.5 10*3/uL (ref 4.0–10.5)
nRBC: 0 % (ref 0.0–0.2)

## 2021-07-02 LAB — CMP (CANCER CENTER ONLY)
ALT: 36 U/L (ref 0–44)
AST: 21 U/L (ref 15–41)
Albumin: 3.9 g/dL (ref 3.5–5.0)
Alkaline Phosphatase: 205 U/L — ABNORMAL HIGH (ref 38–126)
Anion gap: 4 — ABNORMAL LOW (ref 5–15)
BUN: 19 mg/dL (ref 8–23)
CO2: 27 mmol/L (ref 22–32)
Calcium: 9.8 mg/dL (ref 8.9–10.3)
Chloride: 105 mmol/L (ref 98–111)
Creatinine: 1.11 mg/dL — ABNORMAL HIGH (ref 0.44–1.00)
GFR, Estimated: 53 mL/min — ABNORMAL LOW (ref >60)
Glucose, Bld: 189 mg/dL — ABNORMAL HIGH (ref 70–99)
Potassium: 4.4 mmol/L (ref 3.5–5.1)
Sodium: 136 mmol/L (ref 135–145)
Total Bilirubin: 0.3 mg/dL (ref 0.3–1.2)
Total Protein: 6.5 g/dL (ref 6.5–8.1)

## 2021-07-02 LAB — FERRITIN: Ferritin: 53 ng/mL (ref 11–307)

## 2021-07-02 LAB — IRON AND IRON BINDING CAPACITY (CC-WL,HP ONLY)
Iron: 49 ug/dL (ref 28–170)
Saturation Ratios: 12 % (ref 10.4–31.8)
TIBC: 405 ug/dL (ref 250–450)
UIBC: 356 ug/dL (ref 148–442)

## 2021-07-02 MED ORDER — DIPHENHYDRAMINE HCL 25 MG PO CAPS
25.0000 mg | ORAL_CAPSULE | Freq: Once | ORAL | Status: DC
  Filled 2021-07-02: qty 1

## 2021-07-02 MED ORDER — SODIUM CHLORIDE 0.9% FLUSH
10.0000 mL | Freq: Once | INTRAVENOUS | Status: AC
  Administered 2021-07-02: 10 mL

## 2021-07-02 MED ORDER — HEPARIN SOD (PORK) LOCK FLUSH 100 UNIT/ML IV SOLN: 250.0000 [IU] | Freq: Once | INTRAVENOUS | Status: DC

## 2021-07-02 MED ORDER — SODIUM CHLORIDE 0.9% FLUSH: 10.0000 mL | Freq: Once | INTRAVENOUS | Status: DC

## 2021-07-02 MED ORDER — SODIUM CHLORIDE 0.9 % IV SOLN: Freq: Once | INTRAVENOUS | Status: AC

## 2021-07-02 MED ORDER — SODIUM CHLORIDE 0.9 % IV SOLN
300.0000 mg | Freq: Once | INTRAVENOUS | Status: AC
  Administered 2021-07-02: 300 mg via INTRAVENOUS
  Filled 2021-07-02: qty 200

## 2021-07-02 NOTE — Patient Instructions (Signed)

## 2021-07-02 NOTE — Progress Notes (Signed)
Patient received iron treatment today. She did not wait 30 minutes post observation. VSS upon discharge.

## 2021-07-09 ENCOUNTER — Other Ambulatory Visit: Payer: Self-pay

## 2021-07-09 ENCOUNTER — Inpatient Hospital Stay: Payer: Medicare Other

## 2021-07-09 VITALS — BP 138/53 | HR 61 | Temp 98.6°F | Resp 17

## 2021-07-09 DIAGNOSIS — S8254XA Nondisplaced fracture of medial malleolus of right tibia, initial encounter for closed fracture: Secondary | ICD-10-CM | POA: Diagnosis not present

## 2021-07-09 DIAGNOSIS — K5520 Angiodysplasia of colon without hemorrhage: Secondary | ICD-10-CM

## 2021-07-09 DIAGNOSIS — Z95828 Presence of other vascular implants and grafts: Secondary | ICD-10-CM

## 2021-07-09 DIAGNOSIS — D500 Iron deficiency anemia secondary to blood loss (chronic): Secondary | ICD-10-CM

## 2021-07-09 DIAGNOSIS — S82841A Displaced bimalleolar fracture of right lower leg, initial encounter for closed fracture: Secondary | ICD-10-CM | POA: Diagnosis not present

## 2021-07-09 LAB — CMP (CANCER CENTER ONLY)
ALT: 28 U/L (ref 0–44)
AST: 23 U/L (ref 15–41)
Albumin: 3.9 g/dL (ref 3.5–5.0)
Alkaline Phosphatase: 173 U/L — ABNORMAL HIGH (ref 38–126)
Anion gap: 4 — ABNORMAL LOW (ref 5–15)
BUN: 27 mg/dL — ABNORMAL HIGH (ref 8–23)
CO2: 26 mmol/L (ref 22–32)
Calcium: 10.1 mg/dL (ref 8.9–10.3)
Chloride: 107 mmol/L (ref 98–111)
Creatinine: 1.81 mg/dL — ABNORMAL HIGH (ref 0.44–1.00)
GFR, Estimated: 30 mL/min — ABNORMAL LOW (ref >60)
Glucose, Bld: 163 mg/dL — ABNORMAL HIGH (ref 70–99)
Potassium: 5.1 mmol/L (ref 3.5–5.1)
Sodium: 137 mmol/L (ref 135–145)
Total Bilirubin: 0.3 mg/dL (ref 0.3–1.2)
Total Protein: 6.6 g/dL (ref 6.5–8.1)

## 2021-07-09 LAB — CBC WITH DIFFERENTIAL (CANCER CENTER ONLY)
Abs Immature Granulocytes: 0.03 10*3/uL (ref 0.00–0.07)
Basophils Absolute: 0 10*3/uL (ref 0.0–0.1)
Basophils Relative: 0 %
Eosinophils Absolute: 0.2 10*3/uL (ref 0.0–0.5)
Eosinophils Relative: 2 %
HCT: 30.9 % — ABNORMAL LOW (ref 36.0–46.0)
Hemoglobin: 9.4 g/dL — ABNORMAL LOW (ref 12.0–15.0)
Immature Granulocytes: 0 %
Lymphocytes Relative: 20 %
Lymphs Abs: 1.6 10*3/uL (ref 0.7–4.0)
MCH: 23.9 pg — ABNORMAL LOW (ref 26.0–34.0)
MCHC: 30.4 g/dL (ref 30.0–36.0)
MCV: 78.4 fL — ABNORMAL LOW (ref 80.0–100.0)
Monocytes Absolute: 0.8 10*3/uL (ref 0.1–1.0)
Monocytes Relative: 9 %
Neutro Abs: 5.6 10*3/uL (ref 1.7–7.7)
Neutrophils Relative %: 69 %
Platelet Count: 236 10*3/uL (ref 150–400)
RBC: 3.94 MIL/uL (ref 3.87–5.11)
RDW: 19.1 % — ABNORMAL HIGH (ref 11.5–15.5)
WBC Count: 8.2 10*3/uL (ref 4.0–10.5)
nRBC: 0 % (ref 0.0–0.2)

## 2021-07-09 LAB — IRON AND IRON BINDING CAPACITY (CC-WL,HP ONLY)
Iron: 46 ug/dL (ref 28–170)
Saturation Ratios: 12 % (ref 10.4–31.8)
TIBC: 386 ug/dL (ref 250–450)
UIBC: 340 ug/dL (ref 148–442)

## 2021-07-09 LAB — FERRITIN: Ferritin: 212 ng/mL (ref 11–307)

## 2021-07-09 MED ORDER — LIDOCAINE-PRILOCAINE 2.5-2.5 % EX CREA: 1.0000 "application " | TOPICAL_CREAM | CUTANEOUS | 1 refills | Status: DC | PRN

## 2021-07-09 MED ORDER — SODIUM CHLORIDE 0.9 % IV SOLN
300.0000 mg | Freq: Once | INTRAVENOUS | Status: AC
  Administered 2021-07-09: 300 mg via INTRAVENOUS
  Filled 2021-07-09: qty 300

## 2021-07-09 MED ORDER — DIPHENHYDRAMINE HCL 25 MG PO CAPS: 25.0000 mg | ORAL_CAPSULE | Freq: Once | ORAL | Status: DC

## 2021-07-09 MED ORDER — SODIUM CHLORIDE 0.9% FLUSH
10.0000 mL | Freq: Once | INTRAVENOUS | Status: AC
  Administered 2021-07-09: 10 mL via INTRAVENOUS

## 2021-07-09 MED ORDER — HEPARIN SOD (PORK) LOCK FLUSH 100 UNIT/ML IV SOLN
500.0000 [IU] | Freq: Once | INTRAVENOUS | Status: AC
  Administered 2021-07-09: 500 [IU] via INTRAVENOUS

## 2021-07-09 MED ORDER — SODIUM CHLORIDE 0.9% FLUSH
10.0000 mL | Freq: Once | INTRAVENOUS | Status: AC
  Administered 2021-07-09: 10 mL

## 2021-07-09 MED ORDER — SODIUM CHLORIDE 0.9 % IV SOLN: Freq: Once | INTRAVENOUS | Status: AC

## 2021-07-09 NOTE — Progress Notes (Signed)
Pt declined to stay for 30 min post obs, discharged with VSS. Ambulatory to lobby

## 2021-07-09 NOTE — Patient Instructions (Signed)

## 2021-07-11 ENCOUNTER — Emergency Department (HOSPITAL_BASED_OUTPATIENT_CLINIC_OR_DEPARTMENT_OTHER): Payer: Medicare Other

## 2021-07-11 ENCOUNTER — Other Ambulatory Visit: Payer: Self-pay

## 2021-07-11 ENCOUNTER — Encounter (HOSPITAL_BASED_OUTPATIENT_CLINIC_OR_DEPARTMENT_OTHER): Payer: Self-pay | Admitting: Emergency Medicine

## 2021-07-11 ENCOUNTER — Inpatient Hospital Stay (HOSPITAL_BASED_OUTPATIENT_CLINIC_OR_DEPARTMENT_OTHER)
Admission: EM | Admit: 2021-07-11 | Discharge: 2021-07-16 | DRG: 493 | Disposition: A | Discharge status: Skilled Nursing Facility | Payer: Medicare Other | Attending: Student | Admitting: Student

## 2021-07-11 DIAGNOSIS — S82025A Nondisplaced longitudinal fracture of left patella, initial encounter for closed fracture: Secondary | ICD-10-CM | POA: Diagnosis present

## 2021-07-11 DIAGNOSIS — Z720 Tobacco use: Secondary | ICD-10-CM | POA: Diagnosis not present

## 2021-07-11 DIAGNOSIS — N1832 Chronic kidney disease, stage 3b: Secondary | ICD-10-CM | POA: Diagnosis not present

## 2021-07-11 DIAGNOSIS — E1165 Type 2 diabetes mellitus with hyperglycemia: Secondary | ICD-10-CM | POA: Diagnosis present

## 2021-07-11 DIAGNOSIS — I10 Essential (primary) hypertension: Secondary | ICD-10-CM | POA: Diagnosis not present

## 2021-07-11 DIAGNOSIS — Z419 Encounter for procedure for purposes other than remedying health state, unspecified: Secondary | ICD-10-CM

## 2021-07-11 DIAGNOSIS — Z8249 Family history of ischemic heart disease and other diseases of the circulatory system: Secondary | ICD-10-CM | POA: Diagnosis not present

## 2021-07-11 DIAGNOSIS — M1990 Unspecified osteoarthritis, unspecified site: Secondary | ICD-10-CM | POA: Diagnosis present

## 2021-07-11 DIAGNOSIS — E039 Hypothyroidism, unspecified: Secondary | ICD-10-CM | POA: Diagnosis present

## 2021-07-11 DIAGNOSIS — S82891A Other fracture of right lower leg, initial encounter for closed fracture: Secondary | ICD-10-CM | POA: Diagnosis not present

## 2021-07-11 DIAGNOSIS — Z800 Family history of malignant neoplasm of digestive organs: Secondary | ICD-10-CM

## 2021-07-11 DIAGNOSIS — E1149 Type 2 diabetes mellitus with other diabetic neurological complication: Secondary | ICD-10-CM | POA: Diagnosis present

## 2021-07-11 DIAGNOSIS — Z833 Family history of diabetes mellitus: Secondary | ICD-10-CM | POA: Diagnosis not present

## 2021-07-11 DIAGNOSIS — K219 Gastro-esophageal reflux disease without esophagitis: Secondary | ICD-10-CM | POA: Diagnosis present

## 2021-07-11 DIAGNOSIS — D500 Iron deficiency anemia secondary to blood loss (chronic): Secondary | ICD-10-CM | POA: Diagnosis present

## 2021-07-11 DIAGNOSIS — J449 Chronic obstructive pulmonary disease, unspecified: Secondary | ICD-10-CM | POA: Diagnosis present

## 2021-07-11 DIAGNOSIS — G8929 Other chronic pain: Secondary | ICD-10-CM | POA: Diagnosis present

## 2021-07-11 DIAGNOSIS — Z803 Family history of malignant neoplasm of breast: Secondary | ICD-10-CM

## 2021-07-11 DIAGNOSIS — Z794 Long term (current) use of insulin: Secondary | ICD-10-CM

## 2021-07-11 DIAGNOSIS — M545 Low back pain, unspecified: Secondary | ICD-10-CM | POA: Diagnosis not present

## 2021-07-11 DIAGNOSIS — S82002A Unspecified fracture of left patella, initial encounter for closed fracture: Secondary | ICD-10-CM | POA: Diagnosis not present

## 2021-07-11 DIAGNOSIS — N1831 Chronic kidney disease, stage 3a: Secondary | ICD-10-CM | POA: Diagnosis present

## 2021-07-11 DIAGNOSIS — Z79899 Other long term (current) drug therapy: Secondary | ICD-10-CM

## 2021-07-11 DIAGNOSIS — Z91041 Radiographic dye allergy status: Secondary | ICD-10-CM | POA: Diagnosis not present

## 2021-07-11 DIAGNOSIS — S82899A Other fracture of unspecified lower leg, initial encounter for closed fracture: Secondary | ICD-10-CM | POA: Diagnosis not present

## 2021-07-11 DIAGNOSIS — S82841A Displaced bimalleolar fracture of right lower leg, initial encounter for closed fracture: Secondary | ICD-10-CM | POA: Diagnosis present

## 2021-07-11 DIAGNOSIS — E782 Mixed hyperlipidemia: Secondary | ICD-10-CM | POA: Diagnosis present

## 2021-07-11 DIAGNOSIS — M549 Dorsalgia, unspecified: Secondary | ICD-10-CM | POA: Diagnosis present

## 2021-07-11 DIAGNOSIS — Z20822 Contact with and (suspected) exposure to covid-19: Secondary | ICD-10-CM | POA: Diagnosis present

## 2021-07-11 DIAGNOSIS — E119 Type 2 diabetes mellitus without complications: Secondary | ICD-10-CM | POA: Diagnosis not present

## 2021-07-11 DIAGNOSIS — F17210 Nicotine dependence, cigarettes, uncomplicated: Secondary | ICD-10-CM | POA: Diagnosis present

## 2021-07-11 DIAGNOSIS — Z7982 Long term (current) use of aspirin: Secondary | ICD-10-CM

## 2021-07-11 DIAGNOSIS — W109XXA Fall (on) (from) unspecified stairs and steps, initial encounter: Secondary | ICD-10-CM | POA: Diagnosis present

## 2021-07-11 DIAGNOSIS — I129 Hypertensive chronic kidney disease with stage 1 through stage 4 chronic kidney disease, or unspecified chronic kidney disease: Secondary | ICD-10-CM | POA: Diagnosis present

## 2021-07-11 DIAGNOSIS — Z79890 Hormone replacement therapy (postmenopausal): Secondary | ICD-10-CM

## 2021-07-11 DIAGNOSIS — E1122 Type 2 diabetes mellitus with diabetic chronic kidney disease: Secondary | ICD-10-CM | POA: Diagnosis present

## 2021-07-11 DIAGNOSIS — Z8673 Personal history of transient ischemic attack (TIA), and cerebral infarction without residual deficits: Secondary | ICD-10-CM

## 2021-07-11 DIAGNOSIS — S8254XA Nondisplaced fracture of medial malleolus of right tibia, initial encounter for closed fracture: Secondary | ICD-10-CM | POA: Diagnosis present

## 2021-07-11 DIAGNOSIS — N183 Chronic kidney disease, stage 3 unspecified: Secondary | ICD-10-CM | POA: Diagnosis present

## 2021-07-11 LAB — RESP PANEL BY RT-PCR (FLU A&B, COVID) ARPGX2
Influenza A by PCR: NEGATIVE
Influenza B by PCR: NEGATIVE
SARS Coronavirus 2 by RT PCR: NEGATIVE

## 2021-07-11 LAB — BASIC METABOLIC PANEL
Anion gap: 4 — ABNORMAL LOW (ref 5–15)
BUN: 18 mg/dL (ref 8–23)
CO2: 28 mmol/L (ref 22–32)
Calcium: 10.1 mg/dL (ref 8.9–10.3)
Chloride: 104 mmol/L (ref 98–111)
Creatinine, Ser: 1.05 mg/dL — ABNORMAL HIGH (ref 0.44–1.00)
GFR, Estimated: 57 mL/min — ABNORMAL LOW (ref >60)
Glucose, Bld: 108 mg/dL — ABNORMAL HIGH (ref 70–99)
Potassium: 4.4 mmol/L (ref 3.5–5.1)
Sodium: 136 mmol/L (ref 135–145)

## 2021-07-11 LAB — CBC
HCT: 32.6 % — ABNORMAL LOW (ref 36.0–46.0)
Hemoglobin: 9.8 g/dL — ABNORMAL LOW (ref 12.0–15.0)
MCH: 23.7 pg — ABNORMAL LOW (ref 26.0–34.0)
MCHC: 30.1 g/dL (ref 30.0–36.0)
MCV: 78.7 fL — ABNORMAL LOW (ref 80.0–100.0)
Platelets: 227 10*3/uL (ref 150–400)
RBC: 4.14 MIL/uL (ref 3.87–5.11)
RDW: 19.1 % — ABNORMAL HIGH (ref 11.5–15.5)
WBC: 10.5 10*3/uL (ref 4.0–10.5)
nRBC: 0.2 % (ref 0.0–0.2)

## 2021-07-11 LAB — CBG MONITORING, ED
Glucose-Capillary: 111 mg/dL — ABNORMAL HIGH (ref 70–99)
Glucose-Capillary: 101 mg/dL — ABNORMAL HIGH (ref 70–99)

## 2021-07-11 LAB — GLUCOSE, CAPILLARY: Glucose-Capillary: 161 mg/dL — ABNORMAL HIGH (ref 70–99)

## 2021-07-11 MED ORDER — INSULIN GLARGINE-YFGN 100 UNIT/ML ~~LOC~~ SOLN
10.0000 [IU] | Freq: Every day | SUBCUTANEOUS | Status: DC
Start: 2021-07-11 — End: 2021-07-14
  Administered 2021-07-11 – 2021-07-13 (×3): 10 [IU] via SUBCUTANEOUS
  Filled 2021-07-11 (×5): qty 0.1

## 2021-07-11 MED ORDER — HYDROCODONE-ACETAMINOPHEN 5-325 MG PO TABS
1.0000 | ORAL_TABLET | ORAL | Status: DC | PRN
  Filled 2021-07-11: qty 1

## 2021-07-11 MED ORDER — EZETIMIBE 10 MG PO TABS
10.0000 mg | ORAL_TABLET | Freq: Every day | ORAL | Status: DC
Start: 2021-07-11 — End: 2021-07-16
  Administered 2021-07-11 – 2021-07-16 (×6): 10 mg via ORAL
  Filled 2021-07-11 (×7): qty 1

## 2021-07-11 MED ORDER — METOPROLOL TARTRATE 50 MG PO TABS
50.0000 mg | ORAL_TABLET | Freq: Two times a day (BID) | ORAL | Status: DC
  Administered 2021-07-11 – 2021-07-15 (×9): 50 mg via ORAL
  Filled 2021-07-11 (×10): qty 1

## 2021-07-11 MED ORDER — HYDROCODONE-ACETAMINOPHEN 5-325 MG PO TABS
1.0000 | ORAL_TABLET | Freq: Once | ORAL | Status: AC
  Administered 2021-07-11: 1 via ORAL
  Filled 2021-07-11: qty 1

## 2021-07-11 MED ORDER — GABAPENTIN 100 MG PO CAPS
200.0000 mg | ORAL_CAPSULE | Freq: Four times a day (QID) | ORAL | Status: DC
Start: 2021-07-11 — End: 2021-07-16
  Administered 2021-07-11 – 2021-07-16 (×19): 200 mg via ORAL
  Filled 2021-07-11 (×18): qty 2

## 2021-07-11 MED ORDER — IPRATROPIUM-ALBUTEROL 20-100 MCG/ACT IN AERS: 1.0000 | INHALATION_SPRAY | Freq: Four times a day (QID) | RESPIRATORY_TRACT | Status: DC | PRN

## 2021-07-11 MED ORDER — LOSARTAN POTASSIUM 50 MG PO TABS
25.0000 mg | ORAL_TABLET | Freq: Every day | ORAL | Status: DC
Start: 2021-07-11 — End: 2021-07-16
  Administered 2021-07-11 – 2021-07-16 (×6): 25 mg via ORAL
  Filled 2021-07-11 (×7): qty 1

## 2021-07-11 MED ORDER — ATORVASTATIN CALCIUM 40 MG PO TABS
40.0000 mg | ORAL_TABLET | Freq: Every day | ORAL | Status: DC
Start: 2021-07-11 — End: 2021-07-16
  Administered 2021-07-11 – 2021-07-16 (×6): 40 mg via ORAL
  Filled 2021-07-11 (×7): qty 1

## 2021-07-11 MED ORDER — TETANUS-DIPHTH-ACELL PERTUSSIS 5-2.5-18.5 LF-MCG/0.5 IM SUSY
0.5000 mL | PREFILLED_SYRINGE | Freq: Once | INTRAMUSCULAR | Status: DC
  Filled 2021-07-11: qty 0.5

## 2021-07-11 MED ORDER — ENOXAPARIN SODIUM 30 MG/0.3ML IJ SOSY: 30.0000 mg | PREFILLED_SYRINGE | INTRAMUSCULAR | Status: DC

## 2021-07-11 MED ORDER — HYDROCODONE-ACETAMINOPHEN 5-325 MG PO TABS
1.0000 | ORAL_TABLET | Freq: Once | ORAL | Status: AC
Start: 2021-07-11 — End: 2021-07-11
  Administered 2021-07-11: 1 via ORAL
  Filled 2021-07-11: qty 1

## 2021-07-11 MED ORDER — LEVOTHYROXINE SODIUM 50 MCG PO TABS
75.0000 ug | ORAL_TABLET | Freq: Every day | ORAL | Status: DC
  Administered 2021-07-12 – 2021-07-16 (×5): 75 ug via ORAL
  Filled 2021-07-11 (×5): qty 1

## 2021-07-11 MED ORDER — MUPIROCIN 2 % EX OINT
1.0000 "application " | TOPICAL_OINTMENT | Freq: Two times a day (BID) | CUTANEOUS | Status: AC
  Administered 2021-07-11 – 2021-07-16 (×10): 1 via NASAL
  Filled 2021-07-11 (×4): qty 22

## 2021-07-11 MED ORDER — ACETAMINOPHEN 325 MG PO TABS
650.0000 mg | ORAL_TABLET | Freq: Four times a day (QID) | ORAL | Status: DC | PRN
  Administered 2021-07-11: 650 mg via ORAL
  Filled 2021-07-11: qty 2

## 2021-07-11 MED ORDER — AMLODIPINE BESYLATE 10 MG PO TABS
10.0000 mg | ORAL_TABLET | Freq: Every day | ORAL | Status: DC
Start: 2021-07-11 — End: 2021-07-16
  Administered 2021-07-11 – 2021-07-16 (×6): 10 mg via ORAL
  Filled 2021-07-11 (×6): qty 1

## 2021-07-11 MED ORDER — FAMOTIDINE 20 MG PO TABS
20.0000 mg | ORAL_TABLET | Freq: Two times a day (BID) | ORAL | Status: DC
  Administered 2021-07-11 – 2021-07-16 (×10): 20 mg via ORAL
  Filled 2021-07-11 (×11): qty 1

## 2021-07-11 MED ORDER — PREGABALIN 25 MG PO CAPS: 25.0000 mg | ORAL_CAPSULE | Freq: Two times a day (BID) | ORAL | Status: DC

## 2021-07-11 MED ORDER — INSULIN ASPART 100 UNIT/ML IJ SOLN
0.0000 [IU] | Freq: Every day | INTRAMUSCULAR | Status: DC
  Administered 2021-07-13: 4 [IU] via SUBCUTANEOUS

## 2021-07-11 MED ORDER — IPRATROPIUM-ALBUTEROL 0.5-2.5 (3) MG/3ML IN SOLN: 3.0000 mL | Freq: Four times a day (QID) | RESPIRATORY_TRACT | Status: DC | PRN

## 2021-07-11 MED ORDER — INSULIN ASPART 100 UNIT/ML IJ SOLN
0.0000 [IU] | Freq: Three times a day (TID) | INTRAMUSCULAR | Status: DC
  Administered 2021-07-12: 5 [IU] via SUBCUTANEOUS
  Administered 2021-07-12: 2 [IU] via SUBCUTANEOUS
  Administered 2021-07-12: 3 [IU] via SUBCUTANEOUS
  Administered 2021-07-13 (×2): 2 [IU] via SUBCUTANEOUS
  Administered 2021-07-14: 11 [IU] via SUBCUTANEOUS

## 2021-07-11 MED ORDER — HYDROMORPHONE HCL 1 MG/ML IJ SOLN
0.5000 mg | INTRAMUSCULAR | Status: DC | PRN
  Administered 2021-07-11 – 2021-07-12 (×6): 0.5 mg via INTRAVENOUS
  Filled 2021-07-11 (×5): qty 0.5

## 2021-07-11 MED ORDER — ACETAMINOPHEN 650 MG RE SUPP: 650.0000 mg | Freq: Four times a day (QID) | RECTAL | Status: DC | PRN

## 2021-07-11 NOTE — Assessment & Plan Note (Addendum)
Stable.  Continue home Synthroid

## 2021-07-11 NOTE — Consult Note (Signed)
Reason for Consult:right ankle fracture, left patella fracture Referring Physician: Dr. Derek Mound is an 71 y.o. female.  HPI: 71 year old female with a past medical history of COPD, osteoarthritis, type II DM, GERD, single kidney, hypercalcemia, hyperlipidemia, hypertension, history of pneumonia, stage III CKD, history of nonhemorrhagic CVA, tobacco abuse who came into the emergency department with complaints of having a fall with inability to bear weight on the right ankle and left knee swelling.  Imaging showed distal fibula and medial malleolus fracture. She was originally evaluated at [facility] and was transferred to [facility] for further treatment. Patient denies hitting her head and denies loss of consciousness as reason for her fall.   Past Medical History:  Diagnosis Date   Acute renal failure (HCC)    Anemia    Arthritis    COPD (chronic obstructive pulmonary disease) (HCC)    Diabetes mellitus without complication (HCC)    type II    GERD (gastroesophageal reflux disease)    Hypercalcemia    Hyperlipidemia    Hypertension    Pneumonia    Renal disorder    Single kidney    Stroke [facility])    July 2021   Thrombosis    Tobacco abuse     Past Surgical History:  Procedure Laterality Date   ABDOMINAL HYSTERECTOMY     blood clots removed from descending aorta      BREAST LUMPECTOMY WITH RADIOACTIVE SEED LOCALIZATION Right 01/25/2021   Procedure: RIGHT BREAST LUMPECTOMY WITH RADIOACTIVE SEED LOCALIZATION;  Surgeon: Coralie Keens, MD;  Location: [place];  Service: General;  Laterality: Right;   CHOLECYSTECTOMY     COLONOSCOPY     COLONOSCOPY WITH PROPOFOL N/A 04/17/2017   Procedure: COLONOSCOPY WITH PROPOFOL;  Surgeon: Wilford Corner, MD;  Location: [facility];  Service: Endoscopy;  Laterality: N/A;   ESOPHAGOGASTRODUODENOSCOPY (EGD) WITH PROPOFOL N/A 04/17/2017   Procedure: ESOPHAGOGASTRODUODENOSCOPY (EGD) WITH PROPOFOL;  Surgeon:  Wilford Corner, MD;  Location: [facility];  Service: Endoscopy;  Laterality: N/A;   IR 3D INDEPENDENT WKST  05/04/2020   IR ANGIO INTRA EXTRACRAN SEL COM CAROTID INNOMINATE BILAT MOD SED  02/06/2020   IR ANGIO INTRA EXTRACRAN SEL INTERNAL CAROTID UNI L MOD SED  05/04/2020   IR ANGIO VERTEBRAL SEL VERTEBRAL BILAT MOD SED  02/06/2020   IR ANGIOGRAM FOLLOW UP STUDY  05/04/2020   IR IMAGING GUIDED PORT INSERTION  05/01/2019   IR NEURO EACH ADD'L AFTER BASIC UNI LEFT (MS)  05/04/2020   IR RADIOLOGIST EVAL & MGMT  05/28/2020   IR TRANSCATH/EMBOLIZ  05/04/2020   IR US GUIDE VASC ACCESS RIGHT  02/06/2020   IR US GUIDE VASC ACCESS RIGHT  05/04/2020   LESION REMOVAL Left 01/25/2021   Procedure: ABNORMAL SKIN LESION REMOVAL LEFT ABDOMINAL WALL;  Surgeon: Coralie Keens, MD;  Location: [place];  Service: General;  Laterality: Left;   NEPHRECTOMY RECIPIENT     RADIOLOGY WITH ANESTHESIA N/A 05/04/2020   Procedure: Sheppard Plumber;  Surgeon: Luanne Bras, MD;  Location: [place];  Service: Radiology;  Laterality: N/A;    Family History  Problem Relation Age of Onset   Hypertension Mother    Diabetes Father    Hypertension Brother    Breast cancer Maternal Aunt        48s   Breast cancer Maternal Aunt 75       colon cancer    Social History:  reports that she has been smoking cigarettes. She has  a 10.00 pack-year smoking history. She has never used smokeless tobacco. She reports current alcohol use. She reports that she does not use drugs.  Allergies:  Allergies  Allergen Reactions   Contrast Media [Iodinated Contrast Media]     Renal hypertension per physician   Other Other (See Comments)    Medications: I have reviewed the patient's current medications.  Results for orders placed or performed during the hospital encounter of 07/11/21 (from the past 48 hour(s))  CBG monitoring, ED     Status: Abnormal   Collection Time: 07/11/21 10:18 AM  Result Value Ref Range    Glucose-Capillary 111 (H) 70 - 99 mg/dL    Comment: Glucose reference range applies only to samples taken after fasting for at least 8 hours.  Resp Panel by RT-PCR (Flu A&B, Covid) Nasopharyngeal Swab     Status: None   Collection Time: 07/11/21 12:40 PM   Specimen: Nasopharyngeal Swab; Nasopharyngeal(NP) swabs in vial transport medium  Result Value Ref Range   SARS Coronavirus 2 by RT PCR NEGATIVE NEGATIVE    Comment: (NOTE) SARS-CoV-2 target nucleic acids are NOT DETECTED.  The SARS-CoV-2 RNA is generally detectable in upper respiratory specimens during the acute phase of infection. The lowest concentration of SARS-CoV-2 viral copies this assay can detect is 138 copies/mL. A negative result does not preclude SARS-Cov-2 infection and should not be used as the sole basis for treatment or other patient management decisions. A negative result may occur with  improper specimen collection/handling, submission of specimen other than nasopharyngeal swab, presence of viral mutation(s) within the areas targeted by this assay, and inadequate number of viral copies(<138 copies/mL). A negative result must be combined with clinical observations, patient history, and epidemiological information. The expected result is Negative.  Fact Sheet for Patients:  EntrepreneurPulse.com.au  Fact Sheet for Healthcare Providers:  IncredibleEmployment.be  This test is no t yet approved or cleared by the [place] FDA and  has been authorized for detection and/or diagnosis of SARS-CoV-2 by FDA under an Emergency Use Authorization (EUA). This EUA will remain  in effect (meaning this test can be used) for the duration of the COVID-19 declaration under Section 564(b)(1) of the Act, 21 U.S.C.section 360bbb-3(b)(1), unless the authorization is terminated  or revoked sooner.       Influenza A by PCR NEGATIVE NEGATIVE   Influenza B by PCR NEGATIVE NEGATIVE    Comment:  (NOTE) The Xpert Xpress SARS-CoV-2/FLU/RSV plus assay is intended as an aid in the diagnosis of influenza from Nasopharyngeal swab specimens and should not be used as a sole basis for treatment. Nasal washings and aspirates are unacceptable for Xpert Xpress SARS-CoV-2/FLU/RSV testing.  Fact Sheet for Patients: EntrepreneurPulse.com.au  Fact Sheet for Healthcare Providers: IncredibleEmployment.be  This test is not yet approved or cleared by the [place] FDA and has been authorized for detection and/or diagnosis of SARS-CoV-2 by FDA under an Emergency Use Authorization (EUA). This EUA will remain in effect (meaning this test can be used) for the duration of the COVID-19 declaration under Section 564(b)(1) of the Act, 21 U.S.C. section 360bbb-3(b)(1), unless the authorization is terminated or revoked.  Performed at [facility], [address] [place], [place] [address]   CBG monitoring, ED     Status: Abnormal   Collection Time: 07/11/21  1:13 PM  Result Value Ref Range   Glucose-Capillary 101 (H) 70 - 99 mg/dL    Comment: Glucose reference range applies only to samples taken after fasting for at  least 8 hours.    DG Wrist Complete Left  Result Date: 07/11/2021 CLINICAL DATA:  Patient fell yesterday at home. Patient awoke this morning unable to bear weight on the right ankle. Right ankle pain. Left knee swelling. Hip and left wrist discomfort. EXAM: LEFT WRIST - COMPLETE 3+ VIEW COMPARISON:  None. FINDINGS: There is no evidence of fracture or dislocation. There is no evidence of arthropathy or other focal bone abnormality. Soft tissues are unremarkable. IMPRESSION: Negative. Electronically Signed   By: Lajean Manes M.D.   On: 07/11/2021 11:43   DG Ankle Complete Right  Result Date: 07/11/2021 CLINICAL DATA:  Patient fell yesterday at home. Patient awoke this morning unable to bear weight on the right ankle. Right ankle  pain. Left knee swelling. Hip and left wrist discomfort. EXAM: RIGHT ANKLE - COMPLETE 3+ VIEW COMPARISON:  None. FINDINGS: Bimalleolar fractures. There is a nondisplaced fracture across the base of the medial malleolus with a better defined transverse fracture across the distal fibula, just below the level of the ankle mortise. No other fractures.  Ankle joint normally spaced and aligned. Diffuse surrounding soft tissue swelling. IMPRESSION: 1. Nondisplaced fractures of the medial malleolus and distal fibula. Normally aligned ankle joint. Electronically Signed   By: Lajean Manes M.D.   On: 07/11/2021 11:40   DG Knee Complete 4 Views Left  Result Date: 07/11/2021 CLINICAL DATA:  Patient fell yesterday at home. Patient awoke this morning unable to bear weight on the right ankle. Right ankle pain. Left knee swelling. Hip and left wrist discomfort. EXAM: LEFT KNEE - COMPLETE 4+ VIEW COMPARISON:  None. FINDINGS: Nondisplaced, non comminuted fracture along the inferior aspect of the patella. No other fractures.  No bone lesions. Knee joint normally spaced and aligned. No degenerative/arthropathic changes. Small joint effusion. Mild anterior soft tissue swelling. IMPRESSION: 1. Nondisplaced, non comminuted transverse fracture across the inferior aspect of the patella, associated with a small joint effusion. Electronically Signed   By: Lajean Manes M.D.   On: 07/11/2021 11:42   DG Knee Complete 4 Views Right  Result Date: 07/11/2021 CLINICAL DATA:  Fall yesterday.  Right knee pain. EXAM: RIGHT KNEE - COMPLETE 4+ VIEW COMPARISON:  None. FINDINGS: No evidence of fracture, dislocation, or joint effusion. No evidence of arthropathy or other focal bone abnormality. Soft tissues are unremarkable. IMPRESSION: Negative. Electronically Signed   By: Lajean Manes M.D.   On: 07/11/2021 13:14   DG Hips Bilat W or Wo Pelvis 2 Views  Result Date: 07/11/2021 CLINICAL DATA:  Patient fell yesterday at home. Patient awoke this  morning unable to bear weight on the right ankle. Right ankle pain. Left knee swelling. Hip and left wrist discomfort. EXAM: DG HIP (WITH OR WITHOUT PELVIS) 2V BILAT COMPARISON:  None. FINDINGS: There is no evidence of hip fracture or dislocation. There is no evidence of arthropathy or other focal bone abnormality. IMPRESSION: Negative. Electronically Signed   By: Lajean Manes M.D.   On: 07/11/2021 11:41    Review of Systems  Constitutional:  Negative for fever.  HENT:  Negative for trouble swallowing.   Respiratory:  Negative for shortness of breath.   Cardiovascular:  Negative for chest pain.  Gastrointestinal:  Negative for abdominal pain.  Musculoskeletal:  Positive for arthralgias, gait problem, joint swelling and myalgias.  Neurological:  Negative for dizziness.  Psychiatric/Behavioral:  Negative for agitation.   Blood pressure (!) 172/64, pulse 73, temperature 99.6 F (37.6 C), temperature source Oral, resp. rate 20, height 5'  5" (1.651 m), weight 78 kg, SpO2 94 %. Physical Exam Constitutional:      Appearance: Normal appearance.  HENT:     Head: Normocephalic and atraumatic.  Eyes:     Extraocular Movements: Extraocular movements intact.  Cardiovascular:     Rate and Rhythm: Normal rate.     Pulses: Normal pulses.  Pulmonary:     Effort: Pulmonary effort is normal.  Musculoskeletal:     Cervical back: Normal range of motion.     Left knee: Swelling present. No deformity. Decreased range of motion. Tenderness present.       Legs:  Neurological:     Mental Status: She is alert and oriented to person, place, and time. Mental status is at baseline.  Psychiatric:        Mood and Affect: Mood normal.        Behavior: Behavior normal.    Assessment/Plan: Right ankle, closed bimalleolar fracture Left patella, closed nondisplaced fracture  Discussed plan of care with the patient. Left knee does not need surgical intervention. She can be WBAT on the LLE but must wear the knee  immobilizer. Right ankle fracture will require ORIF. Surgery will likely be performed by Dr Lucia Gaskins. Will make patient NPO after midnight but it is possible that surgery will not be until Tuesday. Patient may eat tomorrow if surgery is delayed until Tuesday. Patient must remain NWB for the RLE and in the posterior splint.   Mikhia Dusek L. [place], PA-C 07/11/2021, 5:08 PM

## 2021-07-11 NOTE — Progress Notes (Signed)
Plan of Care Note for accepted transfer   Patient: Bethany [name] MRN: [id number]   [place]: 07/11/2021  Facility requesting transfer: [facility] Requesting Provider: Wynona Dove, DO. Reason for transfer: Right ankle fracture. Facility course:  71 year old female with a past medical history of COPD, osteoarthritis, type II DM, GERD, single kidney, hypercalcemia, hyperlipidemia, hypertension, history of pneumonia, stage III CKD, history of nonhemorrhagic CVA, tobacco abuse who came into the emergency department with complaints of having a fall at home with inability to bear weight on the right ankle and left knee swelling.  Imaging showed distal fibula and medial malleolus fracture.  She has been treated with analgesics and Tdap booster IM.  Orthopedic surgery requested medical admission.  Accepted to telemetry bed at either [place] available bed.  Plan of care: The patient is accepted for admission to Telemetry unit, at [facility] for [facility] depending on bed availability.  Author: Reubin Milan, MD 07/11/2021  Check [email or web address] for on-call coverage.  Nursing staff, Please call [place] number on Amion as soon as patient's arrival, so appropriate admitting provider can evaluate the pt.

## 2021-07-11 NOTE — Assessment & Plan Note (Addendum)
Stable. -Continue home Respimat

## 2021-07-11 NOTE — Progress Notes (Signed)
RT Note: Pt. notified this RT that she has Port on R upper chest for Iron infusions after questioning if it was ok to establish an IV on that side which pt. replied that is where she normally gets one placed but on top of hand, RN staff made aware and this RT attempted 22G IV on R hand which was unable to be obtained, staff notified.

## 2021-07-11 NOTE — Assessment & Plan Note (Addendum)
-  Continue home Lipitor 

## 2021-07-11 NOTE — Assessment & Plan Note (Addendum)
Recent Labs    06/22/21 1248 06/23/21 1157 07/02/21 1252 07/09/21 1235 07/11/21 1900 07/12/21 0503 07/13/21 0937 07/14/21 0645 07/15/21 0356 07/16/21 0548  HGB 11.3* 10.2* 10.0* 9.4* 9.8* 9.2* 10.1* 9.1* 8.9* 8.3*  H&H seems to be at baseline.  Followed by hematology and gets iron infusions.  Anemia panel consistent with iron deficiency.  Patient denies melena or hematochezia. -Received IV ferric gluconate 250 mg  -Outpatient follow-up with hematology -Recheck CBC in 1 week

## 2021-07-11 NOTE — ED Provider Notes (Signed)
[place] EMERGENCY DEPT Provider Note   CSN: [id number] Arrival date & time: 07/11/21  4098     History  No chief complaint on file.   Bethany [name] is a 71 y.o. female.  This is a 71 y.o. female with significant medical history as below, including hypertension, COPD, CVA, hyperlipidemia, singleton kidney who presents to the ED with complaint of fall  Patient was ambulating up a set of stairs which were wet, she turned quickly and slipped, fell to the ground.  No head injury, no LOC, no thinners.  Injury to her right ankle and left knee.  Left wrist.  Unable to ambulate secondary to severe discomfort of right ankle and left knee.  No medications prior to arrival.  Last p.o. intake was yesterday evening.  Unsure of last tetanus shot.   No headaches, numbness, tingling, nausea, vomiting, back pain, incontinence, saddle paresthesias.      Past Medical History: No date: Acute renal failure (HCC) No date: Anemia No date: Arthritis No date: COPD (chronic obstructive pulmonary disease) (HCC) No date: Diabetes mellitus without complication (HCC)     Comment:  type II  No date: GERD (gastroesophageal reflux disease) No date: Hypercalcemia No date: Hyperlipidemia No date: Hypertension No date: Pneumonia No date: Renal disorder No date: Single kidney No date: Stroke [facility])     Comment:  July 2021 No date: Thrombosis No date: Tobacco abuse  Past Surgical History: No date: ABDOMINAL HYSTERECTOMY No date: blood clots removed from descending aorta  01/25/2021: BREAST LUMPECTOMY WITH RADIOACTIVE SEED LOCALIZATION; Right     Comment:  Procedure: RIGHT BREAST LUMPECTOMY WITH RADIOACTIVE SEED              LOCALIZATION;  Surgeon: Coralie Keens, MD;  Location:              [place];  Service: General;                Laterality: Right; No date: CHOLECYSTECTOMY No date: COLONOSCOPY 04/17/2017: COLONOSCOPY WITH PROPOFOL; N/A     Comment:  Procedure:  COLONOSCOPY WITH PROPOFOL;  Surgeon:               Wilford Corner, MD;  Location: [facility] ENDOS[facility];  Service:              Endoscopy;  Laterality: N/A; 04/17/2017: ESOPHAGOGASTRODUODENOSCOPY (EGD) WITH PROPOFOL; N/A     Comment:  Procedure: ESOPHAGOGASTRODUODENOSCOPY (EGD) WITH               PROPOFOL;  Surgeon: Wilford Corner, MD;  Location: [facility]               ENDOSCOPY;  Service: Endoscopy;  Laterality: N/A; 05/04/2020: IR 3D INDEPENDENT WKST 02/06/2020: IR ANGIO INTRA EXTRACRAN SEL COM CAROTID INNOMINATE BILAT  MOD SED 05/04/2020: IR ANGIO INTRA EXTRACRAN SEL INTERNAL CAROTID UNI L MOD  SED 02/06/2020: IR ANGIO VERTEBRAL SEL VERTEBRAL BILAT MOD SED 05/04/2020: IR ANGIOGRAM FOLLOW UP STUDY 05/01/2019: IR IMAGING GUIDED PORT INSERTION 05/04/2020: IR NEURO EACH ADD'L AFTER BASIC UNI LEFT (MS) 05/28/2020: IR RADIOLOGIST EVAL & MGMT 05/04/2020: IR TRANSCATH/EMBOLIZ 02/06/2020: IR US GUIDE VASC ACCESS RIGHT 05/04/2020: IR US GUIDE VASC ACCESS RIGHT 01/25/2021: LESION REMOVAL; Left     Comment:  Procedure: ABNORMAL SKIN LESION REMOVAL LEFT ABDOMINAL               WALL;  Surgeon: Coralie Keens, MD;  Location: [facility];  Service: General;  Laterality:               Left; No date: NEPHRECTOMY RECIPIENT 05/04/2020: RADIOLOGY WITH ANESTHESIA; N/A     Comment:  Procedure: EMBLOMIZATION;  Surgeon: Luanne Bras,               MD;  Location: [facility];  Service: Radiology;  Laterality:               N/A;    The history is provided by the patient. No language interpreter was used.      Home Medications Prior to Admission medications   Medication Sig Start Date End Date Taking? Authorizing Provider  allopurinol (ZYLOPRIM) 100 MG tablet Take 100 mg by mouth at bedtime.    [provider]  amLODipine (NORVASC) 10 MG tablet Take 10 mg by mouth daily.    [provider]  aspirin EC 81 MG EC tablet Take 1 tablet (81 mg total) by mouth daily.  Swallow whole. 11/22/19   Danford, Suann Larry, MD  baclofen (LIORESAL) 10 MG tablet Take 10 mg by mouth 3 (three) times daily as needed for muscle spasms.    [provider]  clindamycin (CLEOCIN) 300 MG capsule Take 300 mg by mouth 3 (three) times daily. 11/09/20   [provider]  Dulaglutide (TRULICITY) 1.5 PJ/0.9TO SOPN Inject 1.5 mg into the skin every Monday.     [provider]  ezetimibe (ZETIA) 10 MG tablet Take 10 mg by mouth daily.    [provider]  feeding supplement, ENSURE ENLIVE, (ENSURE ENLIVE) LIQD Take 237 mLs by mouth 2 (two) times daily between meals. 03/30/19   Dana Allan I, MD  furosemide (LASIX) 40 MG tablet Take 40 mg by mouth as needed. 04/08/20   [provider]  gabapentin (NEURONTIN) 100 MG capsule Take 2 capsules (200 mg total) by mouth 4 (four) times daily. 05/11/20   Louk, Bea Graff, PA-C  insulin glargine, 1 Unit Dial, (TOUJEO SOLOSTAR) 300 UNIT/ML Solostar Pen Inject 20 Units into the skin at bedtime.     [provider]  insulin lispro (HUMALOG) 100 UNIT/ML KwikPen INJECT 5 UNITS INTO SKIN BEFORE EACH MEAL WHEN BLOOD SUGAR IS ABOVE 200 11/09/20   [provider]  Ipratropium-Albuterol (COMBIVENT) 20-100 MCG/ACT AERS respimat Inhale 1 puff into the lungs every 6 (six) hours as needed for wheezing or shortness of breath. 03/17/17   Hongalgi, Lenis Dickinson, MD  levothyroxine (SYNTHROID) 75 MCG tablet Take 75 mcg by mouth daily. 02/28/19   [provider]  lidocaine-prilocaine (EMLA) cream Apply 1 application topically as needed. 07/09/21   Truitt Merle, MD  losartan (COZAAR) 25 MG tablet Take 1 tablet (25 mg total) by mouth daily. 11/22/19 03/03/21  Danford, Suann Larry, MD  metoprolol (LOPRESSOR) 50 MG tablet Take 50 mg by mouth 2 (two) times daily.  03/06/14   [provider]  Multiple Vitamin (MULTIVITAMIN WITH MINERALS) TABS tablet Take 1 tablet by mouth daily.    [provider]   NARCAN 4 MG/0.1ML LIQD nasal spray kit Place 1 spray into the nose as needed for opioid reversal. 08/22/19   [provider]  oxyCODONE-acetaminophen (PERCOCET) 10-325 MG tablet Take 1 tablet by mouth every 6 (six) hours as needed (breakthrough pain).     [provider]  pantoprazole (PROTONIX) 40 MG tablet Take 40 mg by mouth every morning.    [provider]  pregabalin (LYRICA) 25 MG capsule  Take 25 mg by mouth 2 (two) times daily.  01/24/19   [provider]  prochlorperazine (COMPAZINE) 5 MG tablet Take 1 tablet (5 mg total) by mouth every 6 (six) hours as needed for nausea or vomiting. 09/28/20   Truitt Merle, MD  sennosides-docusate sodium (SENOKOT-S) 8.6-50 MG tablet Take 1 tablet by mouth at bedtime.    [provider]  TRUEPLUS PEN NEEDLES 32G X 4 MM MISC  10/06/20   [provider]  Vitamin D, Ergocalciferol, (DRISDOL) 1.25 MG (50000 UNIT) CAPS capsule Take 50,000 Units by mouth every Monday.    [provider]  XTAMPZA ER 27 MG C12A Take 27 mg by mouth 2 (two) times daily.  02/27/19   [provider]      Allergies    Contrast media [iodinated contrast media] and Other    Review of Systems   Review of Systems  Constitutional:  Negative for activity change and fever.  HENT:  Negative for facial swelling and trouble swallowing.   Eyes:  Negative for discharge and redness.  Respiratory:  Negative for cough and shortness of breath.   Cardiovascular:  Negative for chest pain and palpitations.  Gastrointestinal:  Negative for abdominal pain and nausea.  Genitourinary:  Negative for dysuria and flank pain.  Musculoskeletal:  Positive for arthralgias, gait problem and joint swelling. Negative for back pain.  Skin:  Positive for wound. Negative for pallor and rash.  Neurological:  Negative for syncope and headaches.   Physical Exam Updated Vital Signs BP (!) 159/54    Pulse 90    Temp 99.5 F (37.5 C) (Oral)    Resp  20    Ht _0  (1.651 m)    Wt 78 kg    SpO2 96%    BMI 28.62 kg/m  Physical Exam Vitals and nursing note reviewed.  Constitutional:      General: She is not in acute distress.    Appearance: Normal appearance.  HENT:     Head: Normocephalic and atraumatic. No raccoon eyes, Battle's sign, right periorbital erythema or left periorbital erythema.     Right Ear: External ear normal.     Left Ear: External ear normal.     Nose: Nose normal.     Mouth/Throat:     Mouth: Mucous membranes are moist.  Eyes:     General: No scleral icterus.       Right eye: No discharge.        Left eye: No discharge.  Cardiovascular:     Rate and Rhythm: Normal rate and regular rhythm.     Pulses: Normal pulses.     Heart sounds: Normal heart sounds.  Pulmonary:     Effort: Pulmonary effort is normal. No respiratory distress.     Breath sounds: Normal breath sounds.  Abdominal:     General: Abdomen is flat.     Palpations: Abdomen is soft.     Tenderness: There is no abdominal tenderness. There is no guarding or rebound.  Musculoskeletal:        General: Normal range of motion.       Arms:     Cervical back: Full passive range of motion without pain and normal range of motion.     Right lower leg: No edema.     Left lower leg: No edema.       Legs:     Comments: Swelling to right ankle.  TTP diffusely to right ankle.  Significantly reduced range  of motion secondary to discomfort.  2+ DP pulses equal bilateral.  Swelling and pain to left knee, abrasion to anterior portion of knee. Low susp for open fx. Limited range of motion secondary to discomfort.  No midline spinous process tenderness to palpation or percussion, no crepitus or step-off.    Skin:    General: Skin is warm and dry.     Capillary Refill: Capillary refill takes less than 2 seconds.  Neurological:     Mental Status: She is alert and oriented to person, place, and time.     GCS: GCS eye subscore is 4. GCS verbal subscore is 5.  GCS motor subscore is 6.  Psychiatric:        Mood and Affect: Mood normal.        Behavior: Behavior normal.    ED Results / Procedures / Treatments   Labs (all labs ordered are listed, but only abnormal results are displayed) Labs Reviewed  CBG MONITORING, ED - Abnormal; Notable for the following components:      Result Value   Glucose-Capillary 111 (*)    All other components within normal limits  CBG MONITORING, ED - Abnormal; Notable for the following components:   Glucose-Capillary 101 (*)    All other components within normal limits  RESP PANEL BY RT-PCR (FLU A&B, COVID) ARPGX2  CBC  BASIC METABOLIC PANEL    EKG None  Radiology DG Wrist Complete Left  Result Date: 07/11/2021 CLINICAL DATA:  Patient fell yesterday at home. Patient awoke this morning unable to bear weight on the right ankle. Right ankle pain. Left knee swelling. Hip and left wrist discomfort. EXAM: LEFT WRIST - COMPLETE 3+ VIEW COMPARISON:  None. FINDINGS: There is no evidence of fracture or dislocation. There is no evidence of arthropathy or other focal bone abnormality. Soft tissues are unremarkable. IMPRESSION: Negative. Electronically Signed   By: Lajean Manes M.D.   On: 07/11/2021 11:43   DG Ankle Complete Right  Result Date: 07/11/2021 CLINICAL DATA:  Patient fell yesterday at home. Patient awoke this morning unable to bear weight on the right ankle. Right ankle pain. Left knee swelling. Hip and left wrist discomfort. EXAM: RIGHT ANKLE - COMPLETE 3+ VIEW COMPARISON:  None. FINDINGS: Bimalleolar fractures. There is a nondisplaced fracture across the base of the medial malleolus with a better defined transverse fracture across the distal fibula, just below the level of the ankle mortise. No other fractures.  Ankle joint normally spaced and aligned. Diffuse surrounding soft tissue swelling. IMPRESSION: 1. Nondisplaced fractures of the medial malleolus and distal fibula. Normally aligned ankle joint.  Electronically Signed   By: Lajean Manes M.D.   On: 07/11/2021 11:40   DG Knee Complete 4 Views Left  Result Date: 07/11/2021 CLINICAL DATA:  Patient fell yesterday at home. Patient awoke this morning unable to bear weight on the right ankle. Right ankle pain. Left knee swelling. Hip and left wrist discomfort. EXAM: LEFT KNEE - COMPLETE 4+ VIEW COMPARISON:  None. FINDINGS: Nondisplaced, non comminuted fracture along the inferior aspect of the patella. No other fractures.  No bone lesions. Knee joint normally spaced and aligned. No degenerative/arthropathic changes. Small joint effusion. Mild anterior soft tissue swelling. IMPRESSION: 1. Nondisplaced, non comminuted transverse fracture across the inferior aspect of the patella, associated with a small joint effusion. Electronically Signed   By: Lajean Manes M.D.   On: 07/11/2021 11:42   DG Knee Complete 4 Views Right  Result Date: 07/11/2021 CLINICAL DATA:  Fall  yesterday.  Right knee pain. EXAM: RIGHT KNEE - COMPLETE 4+ VIEW COMPARISON:  None. FINDINGS: No evidence of fracture, dislocation, or joint effusion. No evidence of arthropathy or other focal bone abnormality. Soft tissues are unremarkable. IMPRESSION: Negative. Electronically Signed   By: Lajean Manes M.D.   On: 07/11/2021 13:14   DG Hips Bilat W or Wo Pelvis 2 Views  Result Date: 07/11/2021 CLINICAL DATA:  Patient fell yesterday at home. Patient awoke this morning unable to bear weight on the right ankle. Right ankle pain. Left knee swelling. Hip and left wrist discomfort. EXAM: DG HIP (WITH OR WITHOUT PELVIS) 2V BILAT COMPARISON:  None. FINDINGS: There is no evidence of hip fracture or dislocation. There is no evidence of arthropathy or other focal bone abnormality. IMPRESSION: Negative. Electronically Signed   By: Lajean Manes M.D.   On: 07/11/2021 11:41    Procedures Procedures    Medications Ordered in ED Medications  Tdap (BOOSTRIX) injection 0.5 mL (has no administration in  time range)  HYDROcodone-acetaminophen (NORCO/VICODIN) 5-325 MG per tablet 1 tablet (1 tablet Oral Given 07/11/21 1049)  HYDROcodone-acetaminophen (NORCO/VICODIN) 5-325 MG per tablet 1 tablet (1 tablet Oral Given 07/11/21 1526)    ED Course/ Medical Decision Making/ A&P                           Medical Decision Making  CC: Fall  This patient presents to the Emergency Department for the above complaint. This involves an extensive number of treatment options and is a complaint that carries with it a high risk of complications and morbidity. Vital signs were reviewed. Serious etiologies considered.  Pt with likely mechanical fall from wet steps, no head injury, no LOC, no thinners. No incontinence, no neuro changes since fall or preceding fall.   Record review:  Previous records obtained and reviewed   Medical and surgical history as noted above.   Work up as above, notable for:   Labs & imaging results that were available during my care of the patient were visualized by me and considered in my medical decision making.   I ordered imaging studies which included x-ray right ankle, left knee, bilateral hip and I visualized the imaging and I agree with radiologist interpretation.  Nondisplaced fracture medial malleolus and distal fibula on the right, nondisplaced patella fracture on the left.  Cardiac monitoring reviewed and interpreted personally which shows NSR  Social determinants of health include - N/a  Personally discussed patient care with consultant; ortho Dr. Tamera Punt.  Agree with splinting/knee immobilizer  Management: Analgesics, ice, elevation of legs  Reassessment:  Patient reports she is feeling somewhat better.  Unable to ambulate.  Right ankle splint and knee immobilizer was placed, unable to ambulate.   Given fractures to bilateral legs recommend admission as she is unable to walk. May require PT/OT, pos placement if does not improve w/ ambulation.  Patient is  agreeable.  D/w Dr Olevia Bowens who accepts pt for admission.      Counseled patient for approximately 5 minutes regarding smoking cessation. Discussed risks of smoking and how they applied and affected their visit here today. Patient not ready to quit at this time, however will follow up with their primary doctor when they are.   CPT code: [id number]: intermediate counseling for smoking cessation      This chart was dictated using voice recognition software.  Despite best efforts to proofread,  errors can occur which can change the documentation  meaning.    Amount and/or Complexity of Data Reviewed External Data Reviewed: labs, radiology and notes. Labs: ordered. Radiology: ordered. Decision-making details documented in ED Course.  Risk Prescription drug management. Decision regarding hospitalization.           Final Clinical Impression(s) / ED Diagnoses Final diagnoses:  Closed nondisplaced longitudinal fracture of left patella, initial encounter  Nondisplaced fracture of medial malleolus of right tibia, initial encounter for closed fracture    Rx / DC Orders ED Discharge Orders     None         Jeanell Sparrow, DO 07/11/21 1537

## 2021-07-11 NOTE — Discharge Instructions (Addendum)
Discharge instructions for Dr. Georgeanna Harrison, M.D., Orthopaedic Surgeon, [place]:  Diet: As you were doing prior to hospitalization, unless instructed otherwise by medical team, dietary/nutrition team, etc. Dressing:   Keep splint on right ankle until you are seen in clinic.  Keep splint clean, dry, and intact. Shower:  Unless otherwise specified, may shower but keep the wounds dry, use an occlusive plastic wrap, NO SOAKING IN TUB.  If the bandage gets wet, change with a clean dry gauze.  Unless otherwise specified, after 5 days dressing(s) should be removed (7 days for PREVENA dressings) and wound(s) may get wet in the shower by allowing water to gently run over.  Again, no soaking in tub, and do NOT submerge for at least 2 weeks!!! Activity:  Increase activity slowly as tolerated.  If you right leg is injured or immobilized, no driving for 6 weeks or until discussed with your surgeon.  If you have an injury or immobilization of the left lower extremity you may not operate a clutch. Please note that driving with any kind of immobilization for the upper extremity (sling, shoulder brace, splint, cast, etc.) may also be considered impaired driving and should not be attempted. Weight Bearing: NON-WEIGHTBEARING (NWB) on the RIGHT LOWER EXTREMITY. WEIGHTBEARING AS TOLERATED (WBAT) on the LEFT LOWER EXTREMITY in knee immobilizer (all times except hygiene). To prevent constipation: You may use over-the-counter stool softener(s) such as Colace (over the counter) 100 mg by mouth twice a day and/or Miralax (over the counter) for constipation as needed.  Drink plenty of fluids (prune juice may be helpful) and high fiber foods.  Itching:  If you experience itching with your medications, try taking only a single pain pill, or even half a pain pill at a time.  You can also use benadryl over the counter for itching or also to help with sleep.  Precautions:  If you experience chest  pain or shortness of breath - call 911 immediately for transfer to the hospital emergency department!! Medications: Please contact the clinic during office hours (Monday through Friday, 0800 to 1600) if you need a refill on any medications.  Please monitor medications and allow 24 to 48 hours to process refill request!!!!  Please note that only medications directly related to the surgery can be prescribed.  For other medications (e.g. blood pressure medicines, sleeping medicines, etc.), please contact the prescribing physician or your primary care provider. DVT Prophylaxis: No pharmacologic DVT prophylaxis due to history of GI bleeds.  If you develop a fever greater that 101.1 deg F, purulent drainage from wound, increased redness or drainage from wound, or calf pain -- Call the office at [phone].

## 2021-07-11 NOTE — ED Triage Notes (Signed)
Pt from home , brought by EMS , fell yesterday , woke up today , unable to bear weight on right ankle , left knee swelling . Alert and oriented x 4 .

## 2021-07-11 NOTE — Assessment & Plan Note (Addendum)
Reports smoking about 10 cigarettes a day for about 45 years -Encourage cessation. -Nicotine patch if needed

## 2021-07-11 NOTE — Progress Notes (Signed)
Patient is unable to wear SCD's. She has a cast on her Rt. Leg and immobilizer on her Lt leg.

## 2021-07-11 NOTE — Assessment & Plan Note (Addendum)
A1c 5.2%. Recent Labs  Lab 07/14/21 1155 07/14/21 1725 07/14/21 2143 07/15/21 0749 07/15/21 1209  GLUCAP 93 134* 133* 149* 178*  -Continue home Tresiba -Diabetic diet

## 2021-07-11 NOTE — Assessment & Plan Note (Addendum)
Recent Labs    05/26/21 1312 06/23/21 1157 07/02/21 1252 07/09/21 1235 07/11/21 1900 07/12/21 0503 07/13/21 0937 07/14/21 0645 07/15/21 1003 07/16/21 0548  BUN 20 25* 19 27* 18 18 19 22 19  25*  CREATININE 1.23* 1.40* 1.11* 1.81* 1.05* 1.11* 1.07* 1.05* 0.89 0.94  Stable.  Continue monitoring -Recheck BMP in 1 week

## 2021-07-11 NOTE — Assessment & Plan Note (Addendum)
Imaging revealed bimalleolar fracture -S/p ORIF by Dr. Mable Fill on 2/28 -NWB on RLE.  WBAT with knee immobilizer on LLE -Patient is already on Xtampza and Percocet for chronic back pain. -Bowel regimen to avoid constipation -Continue PT/OT at SNF -Outpatient follow-up with orthopedic surgery in 2 weeks

## 2021-07-11 NOTE — H&P (Addendum)
History and Physical    Patient: Bethany [name] DOB: [birth year] DOA: 07/11/2021 DOS: the patient was seen and examined on 07/11/2021 PCP: Arthur Holms, NP  Patient coming from: Home  Chief Complaint: No chief complaint on file.   HPI: Bethany [name] is a 71 y.o. female with medical history significant of DM2, htn, hld, arthritis who presents after fall on the day of admit. Pt reported walking up 3 steps to visit her son, realizing she went up the wrong entrance.  Patient turned around and walked back down the steps.  At the base of the steps, patient fell onto the ground.  Denied loss of consciousness.  Upon getting up, patient reported tolerating up to 20 steps before progressively having increasing pain involving left knee and right ankle.  Ultimately, patient was unable to ambulate further and had to be carried to the hospital by her son.  Patient denied any syncope or near syncope.  Denied any chest pain or shortness of breath.  In the emergency department, patient was noted to have nondisplaced fractures involving medial malleolus and distal fibula on the right as well as nondisplaced and noncomminuted transverse fracture across the inferior aspect of the patella on the left.  Patient was subsequently treated directly admitted to [facility] where patient has since been seen by orthopedic surgery.  Review of Systems: As mentioned in the history of present illness. All other systems reviewed and are negative. Past Medical History:  Diagnosis Date   Acute renal failure (HCC)    Anemia    Arthritis    COPD (chronic obstructive pulmonary disease) (HCC)    Diabetes mellitus without complication (HCC)    type II    GERD (gastroesophageal reflux disease)    Hypercalcemia    Hyperlipidemia    Hypertension    Pneumonia    Renal disorder    Single kidney    Stroke [facility])    July 2021   Thrombosis    Tobacco abuse    Past Surgical History:  Procedure Laterality Date    ABDOMINAL HYSTERECTOMY     blood clots removed from descending aorta      BREAST LUMPECTOMY WITH RADIOACTIVE SEED LOCALIZATION Right 01/25/2021   Procedure: RIGHT BREAST LUMPECTOMY WITH RADIOACTIVE SEED LOCALIZATION;  Surgeon: Coralie Keens, MD;  Location: [place];  Service: General;  Laterality: Right;   CHOLECYSTECTOMY     COLONOSCOPY     COLONOSCOPY WITH PROPOFOL N/A 04/17/2017   Procedure: COLONOSCOPY WITH PROPOFOL;  Surgeon: Wilford Corner, MD;  Location[facility];  Service: Endoscopy;  Laterality: N/A;   ESOPHAGOGASTRODUODENOSCOPY (EGD) WITH PROPOFOL N/A 04/17/2017   Procedure: ESOPHAGOGASTRODUODENOSCOPY (EGD) WITH PROPOFOL;  Surgeon: Wilford Corner, MD;  Location: [facility];  Service: Endoscopy;  Laterality: N/A;   IR 3D INDEPENDENT WKST  05/04/2020   IR ANGIO INTRA EXTRACRAN SEL COM CAROTID INNOMINATE BILAT MOD SED  02/06/2020   IR ANGIO INTRA EXTRACRAN SEL INTERNAL CAROTID UNI L MOD SED  05/04/2020   IR ANGIO VERTEBRAL SEL VERTEBRAL BILAT MOD SED  02/06/2020   IR ANGIOGRAM FOLLOW UP STUDY  05/04/2020   IR IMAGING GUIDED PORT INSERTION  05/01/2019   IR NEURO EACH ADD'L AFTER BASIC UNI LEFT (MS)  05/04/2020   IR RADIOLOGIST EVAL & MGMT  05/28/2020   IR TRANSCATH/EMBOLIZ  05/04/2020   IR US GUIDE VASC ACCESS RIGHT  02/06/2020   IR US GUIDE VASC ACCESS RIGHT  05/04/2020   LESION REMOVAL Left 01/25/2021  Procedure: ABNORMAL SKIN LESION REMOVAL LEFT ABDOMINAL WALL;  Surgeon: Coralie Keens, MD;  Location: [place];  Service: General;  Laterality: Left;   NEPHRECTOMY RECIPIENT     RADIOLOGY WITH ANESTHESIA N/A 05/04/2020   Procedure: Sheppard Plumber;  Surgeon: Luanne Bras, MD;  Location: [place];  Service: Radiology;  Laterality: N/A;   Social History:  reports that she has been smoking cigarettes. She has a 10.00 pack-year smoking history. She has never used smokeless tobacco. She reports current alcohol use. She reports that she  does not use drugs.  Allergies  Allergen Reactions   Contrast Media [Iodinated Contrast Media]     Renal hypertension per physician   Other Other (See Comments)    Family History  Problem Relation Age of Onset   Hypertension Mother    Diabetes Father    Hypertension Brother    Breast cancer Maternal Aunt        42s   Breast cancer Maternal Aunt 75       colon cancer    Prior to Admission medications   Medication Sig Start Date End Date Taking? Authorizing Provider  allopurinol (ZYLOPRIM) 100 MG tablet Take 100 mg by mouth at bedtime.    [provider]  amLODipine (NORVASC) 10 MG tablet Take 10 mg by mouth daily.    [provider]  aspirin EC 81 MG EC tablet Take 1 tablet (81 mg total) by mouth daily. Swallow whole. 11/22/19   Danford, Suann Larry, MD  atorvastatin (LIPITOR) 40 MG tablet Take 40 mg by mouth daily. 07/06/21   [provider]  baclofen (LIORESAL) 10 MG tablet Take 10 mg by mouth 3 (three) times daily as needed for muscle spasms.    [provider]  Dulaglutide (TRULICITY) 1.5 [facility]/7.9GX SOPN Inject 1.5 mg into the skin every Monday.     [provider]  ezetimibe (ZETIA) 10 MG tablet Take 10 mg by mouth daily.    [provider]  famotidine (PEPCID) 20 MG tablet Take 20 mg by mouth 2 (two) times daily. 07/06/21   [provider]  feeding supplement, ENSURE ENLIVE, (ENSURE ENLIVE) LIQD Take 237 mLs by mouth 2 (two) times daily between meals. 03/30/19   Dana Allan I, MD  furosemide (LASIX) 40 MG tablet Take 40 mg by mouth as needed. 04/08/20   [provider]  gabapentin (NEURONTIN) 100 MG capsule Take 2 capsules (200 mg total) by mouth 4 (four) times daily. 05/11/20   Louk, Bea Graff, PA-C  insulin glargine, 1 Unit Dial, (TOUJEO SOLOSTAR) 300 UNIT/ML Solostar Pen Inject 20 Units into the skin at bedtime.     [provider]  insulin lispro (HUMALOG) 100 UNIT/ML KwikPen INJECT 5 UNITS  INTO SKIN BEFORE EACH MEAL WHEN BLOOD SUGAR IS ABOVE 200 11/09/20   [provider]  Ipratropium-Albuterol (COMBIVENT) 20-100 MCG/ACT AERS respimat Inhale 1 puff into the lungs every 6 (six) hours as needed for wheezing or shortness of breath. 03/17/17   Hongalgi, Lenis Dickinson, MD  levothyroxine (SYNTHROID) 75 MCG tablet Take 75 mcg by mouth daily. 02/28/19   [provider]  lidocaine-prilocaine (EMLA) cream Apply 1 application topically as needed. Patient taking differently: Apply 1 application topically daily as needed (port site access). 07/09/21   Truitt Merle, MD  losartan (COZAAR) 25 MG tablet Take 1 tablet (25 mg total) by mouth daily. 11/22/19 08/21/21  Danford, Suann Larry, MD  metoprolol (LOPRESSOR) 50 MG tablet Take 50 mg by mouth 2 (two)  times daily.  03/06/14   [provider]  Multiple Vitamin (MULTIVITAMIN WITH MINERALS) TABS tablet Take 1 tablet by mouth daily.    [provider]  NARCAN 4 MG/0.1ML LIQD nasal spray kit Place 1 spray into the nose as needed for opioid reversal. 08/22/19   [provider]  oxyCODONE-acetaminophen (PERCOCET) 10-325 MG tablet Take 1 tablet by mouth every 6 (six) hours as needed (breakthrough pain).     [provider]  pantoprazole (PROTONIX) 40 MG tablet Take 40 mg by mouth every morning.    [provider]  pregabalin (LYRICA) 25 MG capsule Take 25 mg by mouth 2 (two) times daily.  01/24/19   [provider]  prochlorperazine (COMPAZINE) 5 MG tablet Take 1 tablet (5 mg total) by mouth every 6 (six) hours as needed for nausea or vomiting. 09/28/20   Truitt Merle, MD  sennosides-docusate sodium (SENOKOT-S) 8.6-50 MG tablet Take 1 tablet by mouth at bedtime.    [provider]  Vitamin D, Ergocalciferol, (DRISDOL) 1.25 MG (50000 UNIT) CAPS capsule Take 50,000 Units by mouth every Monday.    [provider]  XTAMPZA ER 27 MG C12A Take 27 mg by mouth 2 (two) times daily.  02/27/19    [provider]    Physical Exam: Vitals:   07/11/21 1100 07/11/21 1200 07/11/21 1230 07/11/21 1640  BP: (!) 188/53 (!) 159/54  (!) 172/64  Pulse: 80 75 90 73  Resp: _0 Temp:    99.6 F (37.6 C)  TempSrc:    Oral  SpO2: 98% 98% 96% 94%  Weight:      Height:       General exam: Awake, laying in bed, in nad Respiratory system: Normal respiratory effort, no wheezing Cardiovascular system: regular rate, s1, s2 Gastrointestinal system: Soft, nondistended, positive BS Central nervous system: CN2-12 grossly intact, strength intact Extremities: Perfused, no clubbing Skin: Normal skin turgor, no notable skin lesions seen Psychiatry: Mood normal // no visual hallucinations    Data Reviewed:  Xray personally reviewed. Nondisplaced fractures of medial malleolus and distal fibula and  nondisplaced, noncomminuted transverse fracture across inferior aspect of patella  Assessment and Plan: * Closed right ankle fracture, initial encounter- (present on admission) -Xray personally review, nondisplaced fractures noted as per above -Appreciate input by orthopedic surgery.  Recommendation is for surgical intervention regarding right ankle fracture, tentative for tomorrow or Tuesday. -We will make n.p.o. after midnight -Have since consulted PT OT  Tobacco use- (present on admission) -recommend cessation -Patient reports smoking 10 cigarettes/day.  Has been smoking since she was around 71 years of age. -Patient is agreeable to nicotine patch while in hospital  Essential hypertension- (present on admission) - Blood pressure suboptimally controlled at this point time, likely made worse by acute pain secondary to presenting fractures -Once pressure medication confirmed by pharmacy, would resume -We will continue patient on hydralazine IV as needed  Hypothyroidism- (present on admission) - Appears stable at this time.  Would continue home thyroid replacement once dose confirmed  by pharmacy  COPD (chronic obstructive pulmonary disease) ([place])- (present on admission) - Lungs are clear on exam, no audible wheezing -Patient is continued on minimal O2 support  Hyperlipemia, mixed- (present on admission) - Would continue lipid medications once confirmed by pharmacy  Diabetes mellitus type 2 with neurological manifestations ([place])- (present on admission) - Continue with sliding scale insulin as needed -Most recent A1c from 05/04/2020 of 4.7  Chronic kidney disease (CKD), stage  III (moderate)- (present on admission) - Presenting creatinine of 1.81, appears to be stable per trends -Repeat basic metabolic panel in the morning  Iron deficiency anemia secondary to blood loss (chronic)- (present on admission) - Patient is followed by hematology as outpatient, undergoing routine iron transfusion -Currently hemodynamically stable -Repeat CBC in the morning       Advance Care Planning:   Code Status: Prior Full  Consults:   Family Communication: Pt in room  Severity of Illness: Requires inpatient admit because pt requires surgical intervention of acute ankle fracture  Author: Marylu Lund, MD 07/11/2021 5:43 PM  For on call review [email or web address].

## 2021-07-11 NOTE — Assessment & Plan Note (Addendum)
BP within acceptable range. -Continue amlodipine, metoprolol and losartan.

## 2021-07-12 ENCOUNTER — Ambulatory Visit: Payer: Self-pay | Admitting: Orthopedic Surgery

## 2021-07-12 DIAGNOSIS — S82891A Other fracture of right lower leg, initial encounter for closed fracture: Secondary | ICD-10-CM | POA: Diagnosis not present

## 2021-07-12 DIAGNOSIS — S82025A Nondisplaced longitudinal fracture of left patella, initial encounter for closed fracture: Secondary | ICD-10-CM | POA: Diagnosis not present

## 2021-07-12 DIAGNOSIS — E1149 Type 2 diabetes mellitus with other diabetic neurological complication: Secondary | ICD-10-CM | POA: Diagnosis not present

## 2021-07-12 LAB — HEMOGLOBIN A1C
Hgb A1c MFr Bld: 5.2 % (ref 4.8–5.6)
Mean Plasma Glucose: 102.54 mg/dL

## 2021-07-12 LAB — CBC
HCT: 30.4 % — ABNORMAL LOW (ref 36.0–46.0)
Hemoglobin: 9.2 g/dL — ABNORMAL LOW (ref 12.0–15.0)
MCH: 24 pg — ABNORMAL LOW (ref 26.0–34.0)
MCHC: 30.3 g/dL (ref 30.0–36.0)
MCV: 79.4 fL — ABNORMAL LOW (ref 80.0–100.0)
Platelets: 222 10*3/uL (ref 150–400)
RBC: 3.83 MIL/uL — ABNORMAL LOW (ref 3.87–5.11)
RDW: 19.1 % — ABNORMAL HIGH (ref 11.5–15.5)
WBC: 8.8 10*3/uL (ref 4.0–10.5)
nRBC: 0.2 % (ref 0.0–0.2)

## 2021-07-12 LAB — COMPREHENSIVE METABOLIC PANEL
ALT: 39 U/L (ref 0–44)
AST: 25 U/L (ref 15–41)
Albumin: 3.4 g/dL — ABNORMAL LOW (ref 3.5–5.0)
Alkaline Phosphatase: 168 U/L — ABNORMAL HIGH (ref 38–126)
Anion gap: 4 — ABNORMAL LOW (ref 5–15)
BUN: 18 mg/dL (ref 8–23)
CO2: 27 mmol/L (ref 22–32)
Calcium: 9.7 mg/dL (ref 8.9–10.3)
Chloride: 105 mmol/L (ref 98–111)
Creatinine, Ser: 1.11 mg/dL — ABNORMAL HIGH (ref 0.44–1.00)
GFR, Estimated: 53 mL/min — ABNORMAL LOW (ref >60)
Glucose, Bld: 122 mg/dL — ABNORMAL HIGH (ref 70–99)
Potassium: 4.3 mmol/L (ref 3.5–5.1)
Sodium: 136 mmol/L (ref 135–145)
Total Bilirubin: 0.3 mg/dL (ref 0.3–1.2)
Total Protein: 6.7 g/dL (ref 6.5–8.1)

## 2021-07-12 LAB — SURGICAL PCR SCREEN
MRSA, PCR: NEGATIVE
Staphylococcus aureus: NEGATIVE

## 2021-07-12 LAB — GLUCOSE, CAPILLARY
Glucose-Capillary: 141 mg/dL — ABNORMAL HIGH (ref 70–99)
Glucose-Capillary: 127 mg/dL — ABNORMAL HIGH (ref 70–99)
Glucose-Capillary: 205 mg/dL — ABNORMAL HIGH (ref 70–99)
Glucose-Capillary: 187 mg/dL — ABNORMAL HIGH (ref 70–99)
Glucose-Capillary: 153 mg/dL — ABNORMAL HIGH (ref 70–99)

## 2021-07-12 MED ORDER — CHLORHEXIDINE GLUCONATE CLOTH 2 % EX PADS
6.0000 | MEDICATED_PAD | Freq: Every day | CUTANEOUS | Status: DC
  Administered 2021-07-12 – 2021-07-16 (×4): 6 via TOPICAL

## 2021-07-12 MED ORDER — HEPARIN SOD (PORK) LOCK FLUSH 100 UNIT/ML IV SOLN
500.0000 [IU] | INTRAVENOUS | Status: AC | PRN
  Administered 2021-07-16: 500 [IU]
  Filled 2021-07-12: qty 5

## 2021-07-12 MED ORDER — METHOCARBAMOL 1000 MG/10ML IJ SOLN
500.0000 mg | Freq: Four times a day (QID) | INTRAVENOUS | Status: DC | PRN
  Administered 2021-07-12 – 2021-07-15 (×2): 500 mg via INTRAVENOUS
  Filled 2021-07-12: qty 500
  Filled 2021-07-12: qty 5

## 2021-07-12 MED ORDER — HYDROMORPHONE HCL 1 MG/ML IJ SOLN
1.0000 mg | INTRAMUSCULAR | Status: DC | PRN
  Administered 2021-07-12: 1 mg via INTRAVENOUS
  Filled 2021-07-12: qty 1

## 2021-07-12 MED ORDER — HYDROMORPHONE HCL 1 MG/ML IJ SOLN
1.0000 mg | INTRAMUSCULAR | Status: DC | PRN
  Administered 2021-07-12 – 2021-07-15 (×11): 1 mg via INTRAVENOUS
  Filled 2021-07-12 (×11): qty 1

## 2021-07-12 MED ORDER — CAMPHOR-MENTHOL 0.5-0.5 % EX LOTN
TOPICAL_LOTION | CUTANEOUS | Status: DC | PRN
  Filled 2021-07-12: qty 222

## 2021-07-12 MED ORDER — DIPHENHYDRAMINE HCL 50 MG/ML IJ SOLN
25.0000 mg | Freq: Four times a day (QID) | INTRAMUSCULAR | Status: DC | PRN
  Administered 2021-07-12 – 2021-07-15 (×4): 25 mg via INTRAVENOUS
  Filled 2021-07-12 (×5): qty 1

## 2021-07-12 MED ORDER — HYDRALAZINE HCL 20 MG/ML IJ SOLN: 10.0000 mg | INTRAMUSCULAR | Status: DC | PRN

## 2021-07-12 MED ORDER — BACITRACIN-NEOMYCIN-POLYMYXIN OINTMENT TUBE
TOPICAL_OINTMENT | Freq: Two times a day (BID) | CUTANEOUS | Status: DC
  Administered 2021-07-12: 1 via TOPICAL
  Filled 2021-07-12: qty 14.17

## 2021-07-12 NOTE — Progress Notes (Signed)
Progress Note   Patient: Bethany [name] MPN:[id number] DOB: 1950/06/04 DOA: 07/11/2021     1 DOS: the patient was seen and examined on 07/12/2021   Brief hospital course:  71 y.o. female with medical history significant of DM2, htn, hld, arthritis who presents after fall on the day of admit. Pt reported walking up 3 steps to visit her son, realizing she went up the wrong entrance.  Patient turned around and walked back down the steps.  At the base of the steps, patient fell onto the ground.  Denied loss of consciousness.  Upon getting up, patient reported tolerating up to 20 steps before progressively having increasing pain involving left knee and right ankle.  Ultimately, patient was unable to ambulate further and had to be carried to the hospital by her son.  Patient denied any syncope or near syncope.  Denied any chest pain or shortness of breath.   In the emergency department, patient was noted to have nondisplaced fractures involving medial malleolus and distal fibula on the right as well as nondisplaced and noncomminuted transverse fracture across the inferior aspect of the patella on the left.  Patient was subsequently treated directly admitted to [facility] where patient has since been seen by orthopedic surgery.  Assessment and Plan: * Closed right ankle fracture, initial encounter- (present on admission) -Xray personally review, nondisplaced fractures noted as per above -Appreciate input by orthopedic surgery. Plan for surgery 2/28 -cont analgesia as needed  Tobacco use- (present on admission) -recommend cessation -Patient reports smoking 10 cigarettes/day.  Has been smoking since she was around 71 years of age. -Patient is agreeable to nicotine patch while in hospital  Essential hypertension- (present on admission) - Blood pressure suboptimally controlled at this point time, likely made worse by acute pain secondary to presenting fractures -Once pressure medication confirmed  by pharmacy, would resume -We will continue patient on hydralazine IV as needed  Hypothyroidism- (present on admission) - Appears stable at this time.  Would continue home thyroid replacement once dose confirmed by pharmacy  COPD (chronic obstructive pulmonary disease) ([place])- (present on admission) - Lungs are clear on exam, no audible wheezing -Patient is continued on minimal O2 support  Hyperlipemia, mixed- (present on admission) - Would continue lipid medications once confirmed by pharmacy  Diabetes mellitus type 2 with neurological manifestations ([place])- (present on admission) - Continue with sliding scale insulin as needed -Most recent A1c from 05/04/2020 of 4.7  Chronic kidney disease (CKD), stage III (moderate)- (present on admission) - Presenting creatinine of 1.81, appears to be stable per trends - Cr down to 1.11 -Cont to follow bmet trends  Iron deficiency anemia secondary to blood loss (chronic)- (present on admission) - Patient is followed by hematology as outpatient, undergoing routine iron transfusion -Currently hemodynamically stable -Repeat CBC in the morning      Subjective: Complaining of LE pains this AM  Physical Exam: Vitals:   07/12/21 0114 07/12/21 0514 07/12/21 1034 07/12/21 1343  BP: (!) 158/65 (!) 164/67 (!) 146/64 (!) 147/55  Pulse: 63 65 76 67  Resp: 16 16  (!) 21  Temp: 98.8 F (37.1 C) 99 F (37.2 C)  98.8 F (37.1 C)  TempSrc: Oral Oral  Oral  SpO2: 96% 97%  95%  Weight:      Height:       General exam: Awake, laying in bed, in nad Respiratory system: Normal respiratory effort, no wheezing Cardiovascular system: regular rate, s1, s2 Gastrointestinal system: Soft, nondistended, positive BS  Central nervous system: CN2-12 grossly intact, strength intact Extremities: Perfused, no clubbing Skin: Normal skin turgor, no notable skin lesions seen Psychiatry: Mood normal // no visual hallucinations   Data Reviewed:  Labs reviewed. Cr  1.11  Family Communication: Pt in room, family not at bedside  Disposition: Status is: Inpatient Remains inpatient appropriate because: Severity of illness    Planned Discharge Destination: Home      Author: Marylu Lund, MD 07/12/2021 5:31 PM  For on call review [email or web address].

## 2021-07-12 NOTE — Progress Notes (Signed)
Plan for surgery on right ankle by Dr Georgeanna Harrison tomorrow, Tuesday 07/13/21. Patient may eat today. Heart healthy/carb modified diet ordered, but will defer to medicine if patient requires a different diet. Patient will be NPO after midnight. Continue NWB RLE, with posterior splint in place on right ankle. WBAT left LLE with knee immobilizer in place.   [place], [place] 07/12/2021 [phone]

## 2021-07-13 ENCOUNTER — Encounter (HOSPITAL_COMMUNITY): Payer: Self-pay | Admitting: *Deleted

## 2021-07-13 ENCOUNTER — Inpatient Hospital Stay (HOSPITAL_COMMUNITY): Payer: Medicare Other | Admitting: Anesthesiology

## 2021-07-13 ENCOUNTER — Inpatient Hospital Stay (HOSPITAL_COMMUNITY): Payer: Medicare Other

## 2021-07-13 ENCOUNTER — Encounter (HOSPITAL_COMMUNITY)
Admission: EM | Disposition: A | Discharge status: Skilled Nursing Facility | Payer: Self-pay | Source: Home / Self Care | Attending: Student

## 2021-07-13 DIAGNOSIS — E119 Type 2 diabetes mellitus without complications: Secondary | ICD-10-CM

## 2021-07-13 DIAGNOSIS — J449 Chronic obstructive pulmonary disease, unspecified: Secondary | ICD-10-CM

## 2021-07-13 DIAGNOSIS — S82891A Other fracture of right lower leg, initial encounter for closed fracture: Secondary | ICD-10-CM | POA: Diagnosis not present

## 2021-07-13 DIAGNOSIS — I10 Essential (primary) hypertension: Secondary | ICD-10-CM

## 2021-07-13 DIAGNOSIS — E1149 Type 2 diabetes mellitus with other diabetic neurological complication: Secondary | ICD-10-CM | POA: Diagnosis not present

## 2021-07-13 DIAGNOSIS — S82899A Other fracture of unspecified lower leg, initial encounter for closed fracture: Secondary | ICD-10-CM

## 2021-07-13 DIAGNOSIS — S82025A Nondisplaced longitudinal fracture of left patella, initial encounter for closed fracture: Secondary | ICD-10-CM | POA: Diagnosis not present

## 2021-07-13 HISTORY — PX: ORIF ANKLE FRACTURE: SHX5408

## 2021-07-13 LAB — CBC
HCT: 33 % — ABNORMAL LOW (ref 36.0–46.0)
Hemoglobin: 10.1 g/dL — ABNORMAL LOW (ref 12.0–15.0)
MCH: 24.3 pg — ABNORMAL LOW (ref 26.0–34.0)
MCHC: 30.6 g/dL (ref 30.0–36.0)
MCV: 79.3 fL — ABNORMAL LOW (ref 80.0–100.0)
Platelets: 241 10*3/uL (ref 150–400)
RBC: 4.16 MIL/uL (ref 3.87–5.11)
RDW: 19 % — ABNORMAL HIGH (ref 11.5–15.5)
WBC: 8.7 10*3/uL (ref 4.0–10.5)
nRBC: 0 % (ref 0.0–0.2)

## 2021-07-13 LAB — COMPREHENSIVE METABOLIC PANEL
ALT: 32 U/L (ref 0–44)
AST: 22 U/L (ref 15–41)
Albumin: 3.4 g/dL — ABNORMAL LOW (ref 3.5–5.0)
Alkaline Phosphatase: 172 U/L — ABNORMAL HIGH (ref 38–126)
Anion gap: 5 (ref 5–15)
BUN: 19 mg/dL (ref 8–23)
CO2: 26 mmol/L (ref 22–32)
Calcium: 9.9 mg/dL (ref 8.9–10.3)
Chloride: 105 mmol/L (ref 98–111)
Creatinine, Ser: 1.07 mg/dL — ABNORMAL HIGH (ref 0.44–1.00)
GFR, Estimated: 56 mL/min — ABNORMAL LOW (ref >60)
Glucose, Bld: 137 mg/dL — ABNORMAL HIGH (ref 70–99)
Potassium: 4.6 mmol/L (ref 3.5–5.1)
Sodium: 136 mmol/L (ref 135–145)
Total Bilirubin: 0.3 mg/dL (ref 0.3–1.2)
Total Protein: 6.9 g/dL (ref 6.5–8.1)

## 2021-07-13 LAB — GLUCOSE, CAPILLARY
Glucose-Capillary: 138 mg/dL — ABNORMAL HIGH (ref 70–99)
Glucose-Capillary: 147 mg/dL — ABNORMAL HIGH (ref 70–99)
Glucose-Capillary: 149 mg/dL — ABNORMAL HIGH (ref 70–99)
Glucose-Capillary: 334 mg/dL — ABNORMAL HIGH (ref 70–99)

## 2021-07-13 SURGERY — OPEN REDUCTION INTERNAL FIXATION (ORIF) ANKLE FRACTURE
Anesthesia: General | Site: Ankle

## 2021-07-13 MED ORDER — ISOPROPYL ALCOHOL 70 % SOLN
Status: DC | PRN
Start: 2021-07-13 — End: 2021-07-13
  Administered 2021-07-13: 1 via TOPICAL

## 2021-07-13 MED ORDER — ONDANSETRON HCL 4 MG/2ML IJ SOLN
4.0000 mg | Freq: Four times a day (QID) | INTRAMUSCULAR | Status: DC | PRN
Start: 2021-07-13 — End: 2021-07-16

## 2021-07-13 MED ORDER — DEXAMETHASONE SODIUM PHOSPHATE 4 MG/ML IJ SOLN
INTRAMUSCULAR | Status: DC | PRN
  Administered 2021-07-13: 5 mg via INTRAVENOUS

## 2021-07-13 MED ORDER — HYDROCODONE-ACETAMINOPHEN 7.5-325 MG PO TABS
1.0000 | ORAL_TABLET | ORAL | Status: DC | PRN
  Administered 2021-07-13 – 2021-07-15 (×6): 2 via ORAL
  Administered 2021-07-16: 1 via ORAL
  Administered 2021-07-16: 2 via ORAL
  Filled 2021-07-13 (×3): qty 2
  Filled 2021-07-13: qty 1
  Filled 2021-07-13 (×4): qty 2

## 2021-07-13 MED ORDER — CHLORHEXIDINE GLUCONATE 4 % EX LIQD: 60.0000 mL | Freq: Once | CUTANEOUS | Status: DC

## 2021-07-13 MED ORDER — DEXAMETHASONE SODIUM PHOSPHATE 10 MG/ML IJ SOLN
INTRAMUSCULAR | Status: AC
  Filled 2021-07-13: qty 1

## 2021-07-13 MED ORDER — SODIUM CHLORIDE 0.9 % IR SOLN
Status: DC | PRN
  Administered 2021-07-13: 1000 mL

## 2021-07-13 MED ORDER — PROPOFOL 10 MG/ML IV BOLUS
INTRAVENOUS | Status: DC | PRN
  Administered 2021-07-13: 150 mg via INTRAVENOUS

## 2021-07-13 MED ORDER — POVIDONE-IODINE 10 % EX SWAB
2.0000 "application " | Freq: Once | CUTANEOUS | Status: AC
  Administered 2021-07-13: 2 via TOPICAL

## 2021-07-13 MED ORDER — FENTANYL CITRATE PF 50 MCG/ML IJ SOSY
PREFILLED_SYRINGE | INTRAMUSCULAR | Status: AC
  Administered 2021-07-13: 50 ug
  Filled 2021-07-13: qty 1

## 2021-07-13 MED ORDER — PHENYLEPHRINE HCL-NACL 20-0.9 MG/250ML-% IV SOLN
INTRAVENOUS | Status: DC | PRN
Start: 2021-07-13 — End: 2021-07-13
  Administered 2021-07-13: 20 ug/min via INTRAVENOUS

## 2021-07-13 MED ORDER — ONDANSETRON HCL 4 MG PO TABS: 4.0000 mg | ORAL_TABLET | Freq: Four times a day (QID) | ORAL | Status: DC | PRN

## 2021-07-13 MED ORDER — HYDROCODONE-ACETAMINOPHEN 5-325 MG PO TABS: 1.0000 | ORAL_TABLET | Freq: Four times a day (QID) | ORAL | 0 refills | Status: DC | PRN

## 2021-07-13 MED ORDER — CEFAZOLIN SODIUM-DEXTROSE 1-4 GM/50ML-% IV SOLN
1.0000 g | Freq: Four times a day (QID) | INTRAVENOUS | Status: AC
  Administered 2021-07-13 – 2021-07-14 (×3): 1 g via INTRAVENOUS
  Filled 2021-07-13 (×3): qty 50

## 2021-07-13 MED ORDER — DOCUSATE SODIUM 100 MG PO CAPS
100.0000 mg | ORAL_CAPSULE | Freq: Two times a day (BID) | ORAL | Status: DC
  Administered 2021-07-13 – 2021-07-15 (×5): 100 mg via ORAL
  Filled 2021-07-13 (×6): qty 1

## 2021-07-13 MED ORDER — EPHEDRINE SULFATE-NACL 50-0.9 MG/10ML-% IV SOSY
PREFILLED_SYRINGE | INTRAVENOUS | Status: DC | PRN
  Administered 2021-07-13: 5 mg via INTRAVENOUS

## 2021-07-13 MED ORDER — EPHEDRINE 5 MG/ML INJ
INTRAVENOUS | Status: AC
  Filled 2021-07-13: qty 5

## 2021-07-13 MED ORDER — ACETAMINOPHEN 500 MG PO TABS
500.0000 mg | ORAL_TABLET | Freq: Four times a day (QID) | ORAL | Status: AC
  Administered 2021-07-13 – 2021-07-14 (×3): 500 mg via ORAL
  Filled 2021-07-13 (×3): qty 1

## 2021-07-13 MED ORDER — ROPIVACAINE HCL 5 MG/ML IJ SOLN: INTRAMUSCULAR | Status: DC | PRN

## 2021-07-13 MED ORDER — ROPIVACAINE HCL 5 MG/ML IJ SOLN
INTRAMUSCULAR | Status: DC | PRN
  Administered 2021-07-13: 35 mL via PERINEURAL

## 2021-07-13 MED ORDER — FENTANYL CITRATE (PF) 100 MCG/2ML IJ SOLN
INTRAMUSCULAR | Status: DC | PRN
  Administered 2021-07-13 (×4): 50 ug via INTRAVENOUS

## 2021-07-13 MED ORDER — LACTATED RINGERS IV SOLN: INTRAVENOUS | Status: DC

## 2021-07-13 MED ORDER — PROPOFOL 10 MG/ML IV BOLUS
INTRAVENOUS | Status: AC
  Filled 2021-07-13: qty 20

## 2021-07-13 MED ORDER — FENTANYL CITRATE (PF) 100 MCG/2ML IJ SOLN
INTRAMUSCULAR | Status: AC
  Filled 2021-07-13: qty 2

## 2021-07-13 MED ORDER — LIDOCAINE-EPINEPHRINE 2 %-1:100000 IJ SOLN
INTRAMUSCULAR | Status: DC | PRN
  Administered 2021-07-13: 15 mL via PERINEURAL

## 2021-07-13 MED ORDER — LIDOCAINE HCL (PF) 2 % IJ SOLN
INTRAMUSCULAR | Status: AC
  Filled 2021-07-13: qty 5

## 2021-07-13 MED ORDER — HYDROCODONE-ACETAMINOPHEN 5-325 MG PO TABS
1.0000 | ORAL_TABLET | ORAL | Status: DC | PRN
  Administered 2021-07-14: 1 via ORAL
  Administered 2021-07-15 – 2021-07-16 (×2): 2 via ORAL
  Administered 2021-07-16: 1 via ORAL
  Filled 2021-07-13: qty 2
  Filled 2021-07-13: qty 1
  Filled 2021-07-13: qty 2
  Filled 2021-07-13: qty 1

## 2021-07-13 MED ORDER — ACETAMINOPHEN 325 MG PO TABS: 325.0000 mg | ORAL_TABLET | Freq: Four times a day (QID) | ORAL | Status: DC | PRN

## 2021-07-13 MED ORDER — MIDAZOLAM HCL 2 MG/2ML IJ SOLN
INTRAMUSCULAR | Status: AC
  Administered 2021-07-13: 1 mg
  Filled 2021-07-13: qty 2

## 2021-07-13 MED ORDER — CHLORHEXIDINE GLUCONATE 0.12 % MT SOLN
15.0000 mL | Freq: Once | OROMUCOSAL | Status: AC
  Administered 2021-07-13: 15 mL via OROMUCOSAL

## 2021-07-13 MED ORDER — LIDOCAINE 2% (20 MG/ML) 5 ML SYRINGE
INTRAMUSCULAR | Status: DC | PRN
Start: 2021-07-13 — End: 2021-07-13
  Administered 2021-07-13: 100 mg via INTRAVENOUS

## 2021-07-13 MED ORDER — ONDANSETRON HCL 4 MG/2ML IJ SOLN
INTRAMUSCULAR | Status: DC | PRN
  Administered 2021-07-13: 4 mg via INTRAVENOUS

## 2021-07-13 MED ORDER — ONDANSETRON HCL 4 MG/2ML IJ SOLN
INTRAMUSCULAR | Status: AC
Start: 2021-07-13 — End: ?
  Filled 2021-07-13: qty 2

## 2021-07-13 MED ORDER — MORPHINE SULFATE (PF) 2 MG/ML IV SOLN
0.5000 mg | INTRAVENOUS | Status: DC | PRN
  Administered 2021-07-14 (×2): 1 mg via INTRAVENOUS
  Filled 2021-07-13 (×2): qty 1

## 2021-07-13 MED ORDER — CEFAZOLIN SODIUM-DEXTROSE 2-4 GM/100ML-% IV SOLN
2.0000 g | INTRAVENOUS | Status: AC
  Administered 2021-07-13: 2 g via INTRAVENOUS
  Filled 2021-07-13: qty 100

## 2021-07-13 SURGICAL SUPPLY — 61 items
ADH SKN CLS APL DERMABOND .7 (GAUZE/BANDAGES/DRESSINGS) ×1
APL PRP STRL LF DISP 70% ISPRP (MISCELLANEOUS) ×1
BAG COUNTER SPONGE SURGICOUNT (BAG) IMPLANT
BAG SPEC THK2 15X12 ZIP CLS (MISCELLANEOUS) ×1
BAG SPNG CNTER NS LX DISP (BAG)
BAG ZIPLOCK 12X15 (MISCELLANEOUS) ×3 IMPLANT
BIT DRILL 2 CANN GRADUATED (BIT) ×1 IMPLANT
BIT DRILL 2.5 CANN ENDOSCOPIC (BIT) ×1 IMPLANT
BIT DRILL 2.5 CANN LNG (BIT) ×1 IMPLANT
BIT DRILL 2.6 CANN (BIT) ×1 IMPLANT
BNDG CMPR MED 10X6 ELC LF (GAUZE/BANDAGES/DRESSINGS) ×1
BNDG ELASTIC 4X5.8 VLCR STR LF (GAUZE/BANDAGES/DRESSINGS) ×3 IMPLANT
BNDG ELASTIC 6X10 VLCR STRL LF (GAUZE/BANDAGES/DRESSINGS) ×3 IMPLANT
CHLORAPREP W/TINT 26 (MISCELLANEOUS) ×3 IMPLANT
COTTON STERILE ROLL (GAUZE/BANDAGES/DRESSINGS) ×3 IMPLANT
CUFF TOURN SGL QUICK 34 (TOURNIQUET CUFF) ×2
CUFF TRNQT CYL 34X4.125X (TOURNIQUET CUFF) ×2 IMPLANT
DERMABOND ADVANCED (GAUZE/BANDAGES/DRESSINGS) ×1
DERMABOND ADVANCED .7 DNX12 (GAUZE/BANDAGES/DRESSINGS) ×2 IMPLANT
DRAPE C-ARM 42X120 X-RAY (DRAPES) ×3 IMPLANT
DRAPE C-ARMOR (DRAPES) ×3 IMPLANT
DRAPE U-SHAPE 47X51 STRL (DRAPES) ×3 IMPLANT
DRSG ADAPTIC 3X8 NADH LF (GAUZE/BANDAGES/DRESSINGS) IMPLANT
DRSG PAD ABDOMINAL 8X10 ST (GAUZE/BANDAGES/DRESSINGS) ×3 IMPLANT
ELECT REM PT RETURN 15FT ADLT (MISCELLANEOUS) ×3 IMPLANT
GAUZE SPONGE 4X4 12PLY STRL (GAUZE/BANDAGES/DRESSINGS) ×3 IMPLANT
GLOVE SRG 8 PF TXTR STRL LF DI (GLOVE) ×2 IMPLANT
GLOVE SURG ORTHO LTX SZ7.5 (GLOVE) ×3 IMPLANT
GLOVE SURG ORTHO LTX SZ8 (GLOVE) ×3 IMPLANT
GLOVE SURG UNDER POLY LF SZ8 (GLOVE) ×2
GOWN STRL REUS W/TWL XL LVL3 (GOWN DISPOSABLE) ×3 IMPLANT
GUIDEWIRE 1.35MM (WIRE) ×2 IMPLANT
GUIDEWIRE 1.6 (WIRE) ×2
GUIDEWIRE ORTH 157X1.6XTROC (WIRE) IMPLANT
IMMOBILIZER KNEE 22 UNIV (SOFTGOODS) ×1 IMPLANT
K wire ×1 IMPLANT
KIT TURNOVER KIT A (KITS) IMPLANT
PACK ORTHO EXTREMITY (CUSTOM PROCEDURE TRAY) ×3 IMPLANT
PAD CAST 4YDX4 CTTN HI CHSV (CAST SUPPLIES) IMPLANT
PADDING CAST COTTON 4X4 STRL (CAST SUPPLIES)
PENCIL SMOKE EVACUATOR (MISCELLANEOUS) IMPLANT
PLATE DISTAL 4 HOLE (Plate) ×1 IMPLANT
PROTECTOR NERVE ULNAR (MISCELLANEOUS) ×3 IMPLANT
SCREW COMP KREULOCK 2.7X12 (Screw) ×3 IMPLANT
SCREW COMP KREULOCK 2.7X16 (Screw) ×1 IMPLANT
SCREW CORT NL FMS 3.5X45 (Screw) ×1 IMPLANT
SCREW LOCKING 2.7X10 ANKLE (Screw) ×1 IMPLANT
SCREW LOW PROFILE 3.5X14 (Screw) ×3 IMPLANT
SCREW LP CANN 4.0X45MM (Screw) ×2 IMPLANT
SPLINT PLASTER CAST XFAST 4X15 (CAST SUPPLIES) ×8 IMPLANT
SPLINT PLASTER XTRA FAST SET 4 (CAST SUPPLIES) ×4
STRIP CLOSURE SKIN 1/2X4 (GAUZE/BANDAGES/DRESSINGS) IMPLANT
SUT MNCRL AB 3-0 PS2 27 (SUTURE) ×3 IMPLANT
SUT MNCRL AB 4-0 PS2 18 (SUTURE) IMPLANT
SUT MON AB 2-0 CT1 27 (SUTURE) ×3 IMPLANT
SUT VIC AB 0 CT1 36 (SUTURE) ×3 IMPLANT
SUT VIC AB 1 CT1 27 (SUTURE)
SUT VIC AB 1 CT1 27XBRD ANTBC (SUTURE) IMPLANT
SUT VIC AB 2-0 CT1 27 (SUTURE)
SUT VIC AB 2-0 CT1 TAPERPNT 27 (SUTURE) IMPLANT
TAK BB THREADED (Anchor) ×1 IMPLANT

## 2021-07-13 NOTE — Progress Notes (Signed)
Progress Note   Patient: Bethany [name] OZH:[id number] DOB: Jul 31, 1950 DOA: 07/11/2021     2 DOS: the patient was seen and examined on 07/13/2021   Brief hospital course: 71 y.o. female with medical history significant of DM2, htn, hld, arthritis who presents after fall on the day of admit. Pt reported walking up 3 steps to visit her son, realizing she went up the wrong entrance.  Patient turned around and walked back down the steps.  At the base of the steps, patient fell onto the ground.  Denied loss of consciousness.  Upon getting up, patient reported tolerating up to 20 steps before progressively having increasing pain involving left knee and right ankle.  Ultimately, patient was unable to ambulate further and had to be carried to the hospital by her son.  Patient denied any syncope or near syncope.  Denied any chest pain or shortness of breath.   In the emergency department, patient was noted to have nondisplaced fractures involving medial malleolus and distal fibula on the right as well as nondisplaced and noncomminuted transverse fracture across the inferior aspect of the patella on the left.  Patient was subsequently treated directly admitted to [facility] where patient has since been seen by orthopedic surgery.  Assessment and Plan: * Closed right ankle fracture, initial encounter- (present on admission) -With nondisplaced L nondisplaced fracture across patella -Xray personally review, nondisplaced fractures noted as per above -Appreciate input by orthopedic surgery. Plan for surgery 2/28 -cont analgesia as needed  Tobacco use- (present on admission) -recommend cessation -Patient reports smoking 10 cigarettes/day.  Has been smoking since she was around 71 years of age. -Continue nicotine patch as tolerated  Essential hypertension- (present on admission) - Blood pressure suboptimally controlled at this point time, likely made worse by acute pain secondary to presenting  fractures -Cont home bp meds as tolerated -We will continue patient on hydralazine IV as needed  Hypothyroidism- (present on admission) - Appears stable at this time.  Would continue home thyroid replacement per home regimen  COPD (chronic obstructive pulmonary disease) ([place])- (present on admission) - Lungs are clear on exam, no audible wheezing -Patient is continued on minimal O2 support  Hyperlipemia, mixed- (present on admission) - Would continue lipid medications per home regimen  Diabetes mellitus type 2 with neurological manifestations ([place])- (present on admission) - Continue with sliding scale insulin as needed -Most recent A1c from 05/04/2020 of 4.7 -Cont Semglee 10 units nightly while NPO. Takes 20 units at home  Chronic kidney disease (CKD), stage III (moderate)- (present on admission) - Presenting creatinine of 1.81, appears to be stable per trends - Cr down to 1.07 -Cont to follow bmet trends  Iron deficiency anemia secondary to blood loss (chronic)- (present on admission) - Patient is followed by hematology as outpatient, undergoing routine iron transfusion -Currently hemodynamically stable -Recheck cbc in AM        Subjective: Complaining of ankle pain. Eager to have surgery  Physical Exam: Vitals:   07/13/21 1005 07/13/21 1100 07/13/21 1215 07/13/21 1500  BP:  (!) 161/60 (!) 150/64 (!) 161/60  Pulse:  65 (!) 56 64  Resp: 13 18 16 20   Temp:  99.3 F (37.4 C)    TempSrc:  Oral    SpO2:  98% 100% 98%  Weight:      Height:       General exam: Awake, laying in bed, in nad Respiratory system: Normal respiratory effort, no wheezing Cardiovascular system: regular rate, s1, s2 Gastrointestinal  system: Soft, nondistended, positive BS Central nervous system: CN2-12 grossly intact, strength intact Extremities: Perfused, no clubbing Skin: Normal skin turgor, no notable skin lesions seen Psychiatry: Mood normal // no visual hallucinations   Data  Reviewed:  Labs reviewed, Cr 1.07  Family Communication: Pt in room, family not at bedside  Disposition: Status is: Inpatient Remains inpatient appropriate because: Severity of illness   Planned Discharge Destination:  Unclear at this time, will likely require PT eval post-op     Author: Marylu Lund, MD 07/13/2021 3:26 PM  For on call review [email or web address].

## 2021-07-13 NOTE — Transfer of Care (Signed)
Immediate Anesthesia Transfer of Care Note  Patient: Bethany [name]  Procedure(s) Performed: OPEN REDUCTION INTERNAL FIXATION (ORIF) ANKLE FRACTURE (Ankle)  Patient Location: PACU  Anesthesia Type:General  Level of Consciousness: awake and alert   Airway & Oxygen Therapy: Patient Spontanous Breathing and Patient connected to face mask oxygen  Post-op Assessment: Report given to RN and Post -op Vital signs reviewed and stable  Post vital signs: Reviewed and stable  Last Vitals:  Vitals Value Taken Time  BP    Temp    Pulse 64 07/13/21 1500  Resp 20 07/13/21 1500  SpO2 98 % 07/13/21 1500  Vitals shown include unvalidated device data.  Last Pain:  Vitals:   07/13/21 1215  TempSrc:   PainSc: 0-No pain         Complications: No notable events documented.

## 2021-07-13 NOTE — H&P (Signed)
PREOPERATIVE H&P  HPI: Bethany [name] is a 71 y.o. female who has presented today for surgery, with the diagnosis of right bimalleolar ankle fracture and left nondisplaced patella fracture.  The various methods of treatment have been discussed with the patient and family.  After consideration of risks, benefits, and other options for treatment, the patient has consented to OPEN REDUCTION INTERNAL FIXATION (ORIF) ANKLE FRACTURE as a surgical intervention.  [name] history has been reviewed, patient examined, no change in status, stable for surgery.  I have reviewed the patient's chart and labs.  Questions were answered to the patient's satisfaction.    PMH: Past Medical History:  Diagnosis Date   Acute renal failure (HCC)    Anemia    Arthritis    COPD (chronic obstructive pulmonary disease) (HCC)    Diabetes mellitus without complication (HCC)    type II    GERD (gastroesophageal reflux disease)    Hypercalcemia    Hyperlipidemia    Hypertension    Pneumonia    Renal disorder    Single kidney    Stroke [facility])    July 2021   Thrombosis    Tobacco abuse     Home Medications Allergies  No current facility-administered medications on file prior to encounter.   Current Outpatient Medications on File Prior to Encounter  Medication Sig Dispense Refill   allopurinol (ZYLOPRIM) 100 MG tablet Take 100 mg by mouth at bedtime.     amLODipine (NORVASC) 10 MG tablet Take 10 mg by mouth daily.     atorvastatin (LIPITOR) 40 MG tablet Take 40 mg by mouth daily.     baclofen (LIORESAL) 10 MG tablet Take 10 mg by mouth 3 (three) times daily as needed for muscle spasms.     Dulaglutide (TRULICITY) 1.5 ZO/1.0RU SOPN Inject 1.5 mg into the skin every Monday.      ezetimibe (ZETIA) 10 MG tablet Take 10 mg by mouth daily.     famotidine (PEPCID) 20 MG tablet Take 20 mg by mouth 2 (two) times daily.     feeding supplement, ENSURE ENLIVE, (ENSURE ENLIVE) LIQD Take 237 mLs by mouth 2 (two) times  daily between meals. 237 mL 12   furosemide (LASIX) 40 MG tablet Take 40 mg by mouth daily as needed for fluid or edema.     gabapentin (NEURONTIN) 100 MG capsule Take 2 capsules (200 mg total) by mouth 4 (four) times daily. 240 capsule 3   insulin glargine, 1 Unit Dial, (TOUJEO SOLOSTAR) 300 UNIT/ML Solostar Pen Inject 20 Units into the skin at bedtime.      insulin lispro (HUMALOG) 100 UNIT/ML KwikPen Inject 5 Units into the skin 3 (three) times daily as needed (blood sugar >200).     Ipratropium-Albuterol (COMBIVENT) 20-100 MCG/ACT AERS respimat Inhale 1 puff into the lungs every 6 (six) hours as needed for wheezing or shortness of breath. 1 Inhaler 0   levothyroxine (SYNTHROID) 75 MCG tablet Take 75 mcg by mouth daily.     lidocaine-prilocaine (EMLA) cream Apply 1 application topically as needed. (Patient taking differently: Apply 1 application topically daily as needed (port site access).) 30 g 1   losartan (COZAAR) 25 MG tablet Take 1 tablet (25 mg total) by mouth daily. 30 tablet 11   metoprolol (LOPRESSOR) 50 MG tablet Take 50 mg by mouth 2 (two) times daily.      NARCAN 4 MG/0.1ML LIQD nasal spray kit Place 1 spray into the nose as needed for opioid reversal.  oxyCODONE-acetaminophen (PERCOCET) 10-325 MG tablet Take 1 tablet by mouth every 6 (six) hours as needed (breakthrough pain).      pantoprazole (PROTONIX) 40 MG tablet Take 40 mg by mouth every morning.     pregabalin (LYRICA) 25 MG capsule Take 25 mg by mouth 2 (two) times daily.      senna (SENOKOT) 8.6 MG TABS tablet Take 1 tablet by mouth at bedtime.     Tetrahydrozoline HCl (VISINE OP) Place 1 drop into both eyes 3 (three) times daily as needed (dry eyes).     XTAMPZA ER 27 MG C12A Take 27 mg by mouth 2 (two) times daily.      aspirin EC 81 MG EC tablet Take 1 tablet (81 mg total) by mouth daily. Swallow whole. (Patient not taking: Reported on 07/11/2021) 30 tablet 11   Allergies  Allergen Reactions   Contrast Media  [Iodinated Contrast Media]     Renal hypertension per physician   Other Other (See Comments)    No ASA/Anti-Coag due to GI bleed     PSH: Past Surgical History:  Procedure Laterality Date   ABDOMINAL HYSTERECTOMY     blood clots removed from descending aorta      BREAST LUMPECTOMY WITH RADIOACTIVE SEED LOCALIZATION Right 01/25/2021   Procedure: RIGHT BREAST LUMPECTOMY WITH RADIOACTIVE SEED LOCALIZATION;  Surgeon: Coralie Keens, MD;  Location: [place];  Service: General;  Laterality: Right;   CHOLECYSTECTOMY     COLONOSCOPY     COLONOSCOPY WITH PROPOFOL N/A 04/17/2017   Procedure: COLONOSCOPY WITH PROPOFOL;  Surgeon: Wilford Corner, MD;  Location: [facility];  Service: Endoscopy;  Laterality: N/A;   ESOPHAGOGASTRODUODENOSCOPY (EGD) WITH PROPOFOL N/A 04/17/2017   Procedure: ESOPHAGOGASTRODUODENOSCOPY (EGD) WITH PROPOFOL;  Surgeon: Wilford Corner, MD;  Location: [facility];  Service: Endoscopy;  Laterality: N/A;   IR 3D INDEPENDENT WKST  05/04/2020   IR ANGIO INTRA EXTRACRAN SEL COM CAROTID INNOMINATE BILAT MOD SED  02/06/2020   IR ANGIO INTRA EXTRACRAN SEL INTERNAL CAROTID UNI L MOD SED  05/04/2020   IR ANGIO VERTEBRAL SEL VERTEBRAL BILAT MOD SED  02/06/2020   IR ANGIOGRAM FOLLOW UP STUDY  05/04/2020   IR IMAGING GUIDED PORT INSERTION  05/01/2019   IR NEURO EACH ADD'L AFTER BASIC UNI LEFT (MS)  05/04/2020   IR RADIOLOGIST EVAL & MGMT  05/28/2020   IR TRANSCATH/EMBOLIZ  05/04/2020   IR US GUIDE VASC ACCESS RIGHT  02/06/2020   IR US GUIDE VASC ACCESS RIGHT  05/04/2020   LESION REMOVAL Left 01/25/2021   Procedure: ABNORMAL SKIN LESION REMOVAL LEFT ABDOMINAL WALL;  Surgeon: Coralie Keens, MD;  Location: [place];  Service: General;  Laterality: Left;   NEPHRECTOMY RECIPIENT     RADIOLOGY WITH ANESTHESIA N/A 05/04/2020   Procedure: Sheppard Plumber;  Surgeon: Luanne Bras, MD;  Location: [place];  Service: Radiology;  Laterality: N/A;      Family History Social History  Family History  Problem Relation Age of Onset   Hypertension Mother    Diabetes Father    Hypertension Brother    Breast cancer Maternal Aunt        68s   Breast cancer Maternal Aunt 75       colon cancer    Social History   Socioeconomic History   Marital status: Single    Spouse name: Not on file   Number of children: 3   Years of education: Not on file   Highest education level: Not on file  Occupational History   Occupation: retired   Tobacco Use   Smoking status: Every Day    Packs/day: 0.25    Years: 40.00    Pack years: 10.00    Types: Cigarettes   Smokeless tobacco: Never   Tobacco comments:    6 cigarettes smoked daily ARJ, RN 04/21/22  Vaping Use   Vaping Use: Never used  Substance and Sexual Activity   Alcohol use: Yes    Comment: socially   Drug use: No   Sexual activity: Not on file  Other Topics Concern   Not on file  Social History Narrative   Lives with daughter    Dorie Rank from [place]   Financial Resource Strain: Not on file  Food Insecurity: Not on file  Transportation Needs: Not on file  Physical Activity: Not on file  Stress: Not on file  Social Connections: Not on file     Review of Systems: MSK: As noted per HPI above GI: No current Nausea/vomiting ENT: Denies sore throat, epistaxis CV: Denies chest pain Resp: No current shortness of breath  Other than mentioned above, there are no Constitutional, Neurological, Psychiatric, ENT, Ophthalmological, Cardiovascular, Respiratory, GI, GU, Musculoskeletal, Integumentary, Lymphatic, Endocrine or Allergic issues.   Physical Examination: CV: Normal distal pulses Lungs: Unlabored respirations RLE: Skin intact with positive wrinkle sign. Limited ankle motion due to pain from injury. Sensation intact to light touch in the superficial peroneal, deep peroneal, and tibial distributions. Warm and well perfused.  Assessment/Plan: OPEN  REDUCTION INTERNAL FIXATION (ORIF) ANKLE FRACTURE    Georgeanna Harrison M.D. Orthopaedic Surgery [facility]  Review of this patient's medications prescribed by other providers does not in any way constitute an endorsement by this clinician of their use, indications, dosage, route, efficacy, interactions, or other clinical parameters.  Portions of the record have been created with voice recognition software.  Grammatical and punctuation errors, random word insertions, wrong-word or "sound-a-like" substitutions, pronoun errors (inaccuracies and/or substitutions), and/or incomplete sentences may have occurred due to the inherent limitations of voice recognition software.  Not all errors are caught or corrected.  Although every attempt is made to root out erroneous and incomplete transcription, the note may still not fully represent the intent or opinion of the author.  Read the chart carefully and recognize, using context, where errors/substitutions have occurred.  Any questions or concerns about the content of this note or information contained within the body of this dictation should be addressed directly with the author for clarification.

## 2021-07-13 NOTE — Progress Notes (Signed)
Assisted Dr. Kalman Shan with right, ultrasound guided, popliteal/saphenous block. Side rails up, monitors on throughout procedure. See vital signs in flow sheet. Tolerated Procedure well.

## 2021-07-13 NOTE — Op Note (Addendum)
OPERATIVE NOTE  Bethany [name] female 71 y.o. 07/13/2021  PREOPERATIVE DIAGNOSIS: Right bimalleolar ankle fracture Left nondisplaced patella fracture Active smoking Type 2 diabetes COPD Stage III CKD Hypertension  Hypothyroidism Hyperlipidemia  POSTOPERATIVE DIAGNOSIS: Right bimalleolar ankle fracture Left nondisplaced patella fracture Active smoking Type 2 diabetes COPD Stage III CKD Hypertension  Hypothyroidism Hyperlipidemia  PROCEDURE(S): Open treatment of right ankle bimalleolar fracture, with internal fixation (02409) Open treatment of right ankle distal tibiofibular syndesmosis joint, with internal fixation (73532) Operative use of fluoroscopy for above procedure(s) (99242)   SURGEON: Georgeanna Harrison, M.D.  ASSISTANT(S): None  ANESTHESIA: General  FINDINGS: Preoperative Examination: RLE: Skin intact with positive wrinkle sign. Limited ankle motion due to pain from injury. Sensation intact to light touch in the superficial peroneal, deep peroneal, and tibial distributions. Warm and well perfused.  Operative Findings: Unstable right ankle bimalleolar fracture with fractures of the medial and lateral malleolus.  Anatomic reduction achieved on AP, mortise, and lateral views with stable reduction and appropriate placement of internal fixation.  IMPLANTS: Implant Name Type Inv. Item Serial No. Manufacturer Lot No. LRB No. Used Action  SCREW LOW PROFILE 3.5X14 - [id number] Screw SCREW LOW PROFILE 3.5X14  [employer]  Right 3 Implanted  SCREW LOCKING 2.7X10 ANKLE - [id number] Screw SCREW LOCKING 2.7X10 ANKLE  [employer]  Right 1 Implanted  Plate    [employer]  Right 1 Implanted  SCREW COMP KREULOCK 2.7X16 - [id number] Screw SCREW COMP KREULOCK 2.7X16  [employer]  Right 1 Implanted  SCREW COMP KREULOCK 2.7X12 - [id number] Screw SCREW COMP KREULOCK 2.7X12  [employer]  Right 3 Implanted  SCREW CORT NL FMS 3.5X45 - [id number] Screw SCREW CORT NL FMS 3.5X45  [employer]   Right 1 Implanted  SCREW LP CANN 4.0X45MM - [id number] Screw SCREW LP CANN 4.0X45MM  [employer]  Right 2 Implanted    INDICATIONS:  The patient is a 71 y.o. female who sustained a mechanical fall down some stairs resulting in a right bimalleolar ankle fracture and a left nondisplaced patella fracture she was previously ambulatory without assistive devices, and after the fall and resulting injury she was unable to ambulate.  She was therefore recommended to undergo open reduction with internal fixation of the right bimalleolar ankle fracture.  She understood the risks, benefits and alternatives to surgery which include but are not limited to bleeding, wound healing complications, infection, damage to surrounding structures, persistent pain, stiffness, lack of improvement, potential for subsequent arthritis or worsening of pre-existing arthritis, nonunion, malunion, and need for further surgery, as well as complications related to anesthesia, cardiovascular complications, and death.  She also understood the potential for continued pain in that there were no guarantees of acceptable outcome.  After weighing these risks the patient opted to proceed with surgery.  TECHNIQUE: Patient was identified in the preoperative holding area.  The right ankle was marked by myself.  Consent was signed by myself and the patient.   Saphenous and popliteal block was performed by anesthesia in the preoperative holding area.  Patient was taken to the operative suite and placed supine on the operative table.  Anesthesia was induced by the anesthesia team.  The patient was positioned appropriately for the procedure and all bony prominences were well padded.  A non-sterile thigh tourniquet was placed on the operative extremity.  Preoperative antibiotics were given. The extremity was prepped and draped in the usual sterile fashion and surgical timeout was performed.  Anatomic landmarks were marked out medially and  laterally, and  linear incision over the posterior aspect fibula was marked out on the skin.  Limb was exsanguinated with an Esmarch bandage and tourniquet was inflated to 300 mmHg.  Skin was incised sharply laterally to the posterior aspect of the fibula.  Underlying subcutaneous tissues were carefully bluntly dissected with Metzenbaums down onto the fibula.  The superficial peroneal nerve was not encountered during exposure.  The sided fibular fracture was exposed.  Fracture was reduced and compressed with a point-to-point reduction instrument provisionally fixed with 2 crossing K wires.  A 4-hole distal fibula locking plate was carefully positioned across the fracture.  With the plate in appropriate position, 3 cortical screws were placed proximally, and locking screws were placed distally.  This resulted in excellent stabilization of the transverse fracture.  Given the patient's risk factors for nonunion/malunion, including type 2 diabetes and active smoking tobacco use, the distal tibiofibular syndesmosis joint was stabilized with an additional screw.  This was placed parallel to the joint and about 30 degrees posterior to anterior through the plate, achieving tricortical purchase.  Attention was turned to the medial side.  A curvilinear incision extending from the anterior medial malleolus down around the tip of the medial malleolus was marked out on the skin.  Skin was incised sharply.  Underlying subcutaneous tissues were carefully dissected with Metzenbaums.  The tip of the medial malleolus was exposed.  With the medial malleolus fracture and a stable reduced position, 2 parallel K wires were passed across the medial malleolus fracture, with the appropriate position confirmed on orthogonal AP and lateral fluoroscopic views.  The near cortex of the anterior wire was drilled with a cannulated drill, and a 45 mm cannulated screw was advanced in position along the wire.  The near cortex of the posterior wire was drilled with  acutely drill, and the second 45 mm cannulated screw was advanced into position.  Appropriate position of the screws was confirmed on orthogonal AP and lateral views, and head of the screws was reduced down to the cortex.  The guidewires were withdrawn.  Final x-rays including AP, mortise, and lateral views were obtained, demonstrating anatomic reduction of the bimalleolar fracture and stable syndesmosis with appropriate placement of internal fixation.  Tourniquet was let down.  Hemostasis was obtained.  Wounds were copiously irrigated.  Wounds were closed in layers with 0 PDS simple inverted interrupted deep, followed by simple inverted interrupted 2-0 Monocryl in the deep dermal layer, followed by running 3-0 Monocryl subcuticular.  Skin was sealed with Dermabond.  Medial lateral wounds were dressed with 4 x 4's and ABDs, and a well-padded appropriately molded AO splint was placed on the ankle.  Patient was awakened from anesthesia and transferred to PACU in stable condition.  She tolerated procedure well.  There were no complications.  POST OPERATIVE INSTRUCTIONS: Mobility: Out of bed with PT/OT Pain control: Continue to wean/titrate to appropriate oral regimen DVT Prophylaxis: No pharmacologic DVT prophylaxis due to history of GI bleeds Further surgical plans: None RUE: Weightbearing as tolerated, no restrictions LUE: Weightbearing as tolerated, no restrictions RLE: Nonweightbearing in a splint LLE: Weightbearing as tolerated in knee immobilizer at all times Disposition: Per primary team Dressing care:  Keep splint on right ankle until seen in clinic.  Keep splint clean, dry, and intact. Follow-up: Please call [facility] [phone]) to schedule follow-op appointment within 2 weeks after surgery.  I have verified that my discharge instructions and follow-up information have been entered in the Discharge  Navigator in [place].  These should automatically populate in the  AVS.  Please print the AVS in its entirety and ensure that the patient or a responsible party has a complete copy of the AVS before they are discharged.  If there are questions regarding discharge instructions or follow-up before the AVS is generated, please check the Discharge Navigator before attempting to contact the surgeon/office.  If unsure how to access the Discharge Navigator or the information contained in the Discharge Navigator, or how to generate/print the AVS, please contact the appropriate Nurse, learning disability.   TOURNIQUET TIME: 55 minutes  BLOOD LOSS: 25 mL         DRAINS: none         SPECIMEN: none       COMPLICATIONS:  * No complications entered in OR log *         DISPOSITION: PACU - hemodynamically stable.         CONDITION: stable   Georgeanna Harrison M.D. Orthopaedic Surgery [facility]   Portions of the record have been created with voice recognition software.  Grammatical and punctuation errors, random word insertions, wrong-word or "sound-a-like" substitutions, pronoun errors (inaccuracies and/or substitutions), and/or incomplete sentences may have occurred due to the inherent limitations of voice recognition software.  Not all errors are caught or corrected.  Although every attempt is made to root out erroneous and incomplete transcription, the note may still not fully represent the intent or opinion of the author.  Read the chart carefully and recognize, using context, where errors/substitutions have occurred.  Any questions or concerns about the content of this note or information contained within the body of this dictation should be addressed directly with the author for clarification.

## 2021-07-13 NOTE — Anesthesia Procedure Notes (Signed)
Procedure Name: LMA Insertion Date/Time: 07/13/2021 12:50 PM Performed by: Claudia Desanctis, CRNA Pre-anesthesia Checklist: Emergency Drugs available, Patient identified, Suction available and Patient being monitored Patient Re-evaluated:Patient Re-evaluated prior to induction Oxygen Delivery Method: Circle system utilized Preoxygenation: Pre-oxygenation with 100% oxygen Induction Type: IV induction Ventilation: Mask ventilation without difficulty LMA: LMA inserted LMA Size: 4.0 Number of attempts: 1 Placement Confirmation: positive ETCO2 and breath sounds checked- equal and bilateral Tube secured with: Tape Dental Injury: Teeth and Oropharynx as per pre-operative assessment

## 2021-07-13 NOTE — Progress Notes (Signed)
Orthopaedic Consult  Date/Time: 07/13/21 11:43 AM  Patient Name: Bethany [name]  Attending Physician: Donne Hazel, MD   ASSESSMENT & PLAN  Orthopaedic Assessment: 71 y.o. female with right bimalleolar ankle fracture and left nondisplaced patella fracture.  Patient has multiple risk factors for adverse outcome of operative fixation of right ankle, including active smoking and diabetes.  Reductions/Procedures/Splinting/Anesthesia Performed: Reductions: None Splinting/casting: None Procedure(s): None Anesthesia: N/A  Plan:  Open reduction and internal fixation of right bimalleolar ankle fracture  Nonweightbearing right lower extremity for the 6 weeks after surgery Patient counseled about the importance of smoking cessation in the postoperative period.  Patient understands that smoking and nicotine can contribute to delayed wound healing, infections, delayed fracture healing, nonunion/malunion, among other complications.  Spent between 3 and 10 minutes discussing importance of smoking cessation and reviewing strategies for reducing and quitting (77414).  Patient encouraged to speak with the medical team and/or primary care to facilitate smoking cessation and discuss additional strategies and potential medical options. We will treat the left patella fracture closed, without manipulation (23953).  Patient currently in inadequately fitting knee immobilizer.  We will switch her to appropriately fitting knee immobilizer in the OR.  She will be weightbearing as tolerated with the knee in extension. Remainder of care, including management of multiple medical comorbidities including COPD, type 2 diabetes, GERD, hyperlipidemia, hypertension, and stage III CKD, per medical team   Georgeanna Harrison M.D. Orthopaedic Surgery [facility]   Medical Decision Making  Amount/complexity of data: Is there a pathologic fracture (e.g. neoplastic, osteoporotic insufficiency fracture)?  No Independent interpretation of radiographic studies: Yes Review of radiology results (e.g. reports): Yes Tests ordered (e.g. additional radiographic studies, labs): No Lab results reviewed: Yes Reviewed old records: Yes History from another source (independent historian, e.g. family/friend/etc.): No Risk: Patient receiving IV controlled substances for pain: Yes Fracture requiring manipulation: No Urgent or emergent (non-elective) surgery likely this admission: Yes Presence of medical comorbidities and/or surgical risk factors (e.g. current smoker, CAD, diabetes, COPD, CKD, etc.): Yes Closed fracture management WITHOUT manipulation: Yes Urgent minor procedure (e.g. joint aspiration, compartment pressure measurement, etc.): No Will likely need surgery as an outpatient: No     HPI Bethany [name] is a 71 y.o. female. Orthopaedic consultation has specifically been requested to address this patient's current musculoskeletal presentation.  She sustained a mechanical fall on some stairs and presented to the emergency room where she was diagnosed with a left nondisplaced patella fracture and right bimalleolar ankle fracture.  Before her injury she ambulated with normal gait without assistive devices.  After the injury she had pain in the left knee and right ankle which was sharp and severe, worse with motion better with rest and relative immobilization.  She is unable to ambulate after the injury.   PMH Past Medical History:  Diagnosis Date   Acute renal failure (HCC)    Anemia    Arthritis    COPD (chronic obstructive pulmonary disease) (HCC)    Diabetes mellitus without complication (HCC)    type II    GERD (gastroesophageal reflux disease)    Hypercalcemia    Hyperlipidemia    Hypertension    Pneumonia    Renal disorder    Single kidney    Stroke [facility])    July 2021   Thrombosis    Tobacco abuse      PSH Past Surgical History:  Procedure Laterality Date   ABDOMINAL HYSTERECTOMY      blood  clots removed from descending aorta      BREAST LUMPECTOMY WITH RADIOACTIVE SEED LOCALIZATION Right 01/25/2021   Procedure: RIGHT BREAST LUMPECTOMY WITH RADIOACTIVE SEED LOCALIZATION;  Surgeon: Coralie Keens, MD;  [facility];  Service: General;  Laterality: Right;   CHOLECYSTECTOMY     COLONOSCOPY     COLONOSCOPY WITH PROPOFOL N/A 04/17/2017   Procedure: COLONOSCOPY WITH PROPOFOL;  Surgeon: Wilford Corner, MD;  Location: [facility];  Service: Endoscopy;  Laterality: N/A;   ESOPHAGOGASTRODUODENOSCOPY (EGD) WITH PROPOFOL N/A 04/17/2017   Procedure: ESOPHAGOGASTRODUODENOSCOPY (EGD) WITH PROPOFOL;  Surgeon: Wilford Corner, MD;  Location: [facility];  Service: Endoscopy;  Laterality: N/A;   IR 3D INDEPENDENT WKST  05/04/2020   IR ANGIO INTRA EXTRACRAN SEL COM CAROTID INNOMINATE BILAT MOD SED  02/06/2020   IR ANGIO INTRA EXTRACRAN SEL INTERNAL CAROTID UNI L MOD SED  05/04/2020   IR ANGIO VERTEBRAL SEL VERTEBRAL BILAT MOD SED  02/06/2020   IR ANGIOGRAM FOLLOW UP STUDY  05/04/2020   IR IMAGING GUIDED PORT INSERTION  05/01/2019   IR NEURO EACH ADD'L AFTER BASIC UNI LEFT (MS)  05/04/2020   IR RADIOLOGIST EVAL & MGMT  05/28/2020   IR TRANSCATH/EMBOLIZ  05/04/2020   IR US GUIDE VASC ACCESS RIGHT  02/06/2020   IR US GUIDE VASC ACCESS RIGHT  05/04/2020   LESION REMOVAL Left 01/25/2021   Procedure: ABNORMAL SKIN LESION REMOVAL LEFT ABDOMINAL WALL;  Surgeon: Coralie Keens, MD;  Location: [place];  Service: General;  Laterality: Left;   NEPHRECTOMY RECIPIENT     RADIOLOGY WITH ANESTHESIA N/A 05/04/2020   Procedure: Sheppard Plumber;  Surgeon: Luanne Bras, MD;  Location: [place];  Service: Radiology;  Laterality: N/A;   Home Medications Prior to Admission medications   Medication Sig Start Date End Date Taking? Authorizing Provider  allopurinol (ZYLOPRIM) 100 MG tablet Take 100 mg by mouth at bedtime.   Yes [provider]   amLODipine (NORVASC) 10 MG tablet Take 10 mg by mouth daily.   Yes [provider]  atorvastatin (LIPITOR) 40 MG tablet Take 40 mg by mouth daily. 07/06/21  Yes [provider]  baclofen (LIORESAL) 10 MG tablet Take 10 mg by mouth 3 (three) times daily as needed for muscle spasms.   Yes [provider]  Dulaglutide (TRULICITY) 1.5 SR/1.5XY SOPN Inject 1.5 mg into the skin every Monday.    Yes [provider]  ezetimibe (ZETIA) 10 MG tablet Take 10 mg by mouth daily.   Yes [provider]  famotidine (PEPCID) 20 MG tablet Take 20 mg by mouth 2 (two) times daily. 07/06/21  Yes [provider]  feeding supplement, ENSURE ENLIVE, (ENSURE ENLIVE) LIQD Take 237 mLs by mouth 2 (two) times daily between meals. 03/30/19  Yes Dana Allan I, MD  furosemide (LASIX) 40 MG tablet Take 40 mg by mouth daily as needed for fluid or edema. 04/08/20  Yes [provider]  gabapentin (NEURONTIN) 100 MG capsule Take 2 capsules (200 mg total) by mouth 4 (four) times daily. 05/11/20  Yes Louk, Alexandra M, PA-C  insulin glargine, 1 Unit Dial, (TOUJEO SOLOSTAR) 300 UNIT/ML Solostar Pen Inject 20 Units into the skin at bedtime.    Yes [provider]  insulin lispro (HUMALOG) 100 UNIT/ML KwikPen Inject 5 Units into the skin 3 (three) times daily as needed (blood sugar >200). 11/09/20  Yes [provider]  Ipratropium-Albuterol (COMBIVENT) 20-100 MCG/ACT AERS respimat Inhale 1 puff into the lungs every 6 (  six) hours as needed for wheezing or shortness of breath. 03/17/17  Yes Hongalgi, Lenis Dickinson, MD  levothyroxine (SYNTHROID) 75 MCG tablet Take 75 mcg by mouth daily. 02/28/19  Yes [provider]  lidocaine-prilocaine (EMLA) cream Apply 1 application topically as needed. Patient taking differently: Apply 1 application topically daily as needed (port site access). 07/09/21  Yes Truitt Merle, MD  losartan (COZAAR) 25 MG tablet Take 1 tablet (25  mg total) by mouth daily. 11/22/19 08/21/21 Yes Danford, Suann Larry, MD  metoprolol (LOPRESSOR) 50 MG tablet Take 50 mg by mouth 2 (two) times daily.  03/06/14  Yes [provider]  NARCAN 4 MG/0.1ML LIQD nasal spray kit Place 1 spray into the nose as needed for opioid reversal. 08/22/19  Yes [provider]  oxyCODONE-acetaminophen (PERCOCET) 10-325 MG tablet Take 1 tablet by mouth every 6 (six) hours as needed (breakthrough pain).    Yes [provider]  pantoprazole (PROTONIX) 40 MG tablet Take 40 mg by mouth every morning.   Yes [provider]  pregabalin (LYRICA) 25 MG capsule Take 25 mg by mouth 2 (two) times daily.  01/24/19  Yes [provider]  senna (SENOKOT) 8.6 MG TABS tablet Take 1 tablet by mouth at bedtime.   Yes [provider]  Tetrahydrozoline HCl (VISINE OP) Place 1 drop into both eyes 3 (three) times daily as needed (dry eyes).   Yes [provider]  XTAMPZA ER 27 MG C12A Take 27 mg by mouth 2 (two) times daily.  02/27/19  Yes [provider]  aspirin EC 81 MG EC tablet Take 1 tablet (81 mg total) by mouth daily. Swallow whole. Patient not taking: Reported on 07/11/2021 11/22/19   Edwin Dada, MD     Allergies Allergies  Allergen Reactions   Contrast Media [Iodinated Contrast Media]     Renal hypertension per physician   Other Other (See Comments)    No ASA/Anti-Coag due to GI bleed     Family History Family History  Problem Relation Age of Onset   Hypertension Mother    Diabetes Father    Hypertension Brother    Breast cancer Maternal Aunt        41s   Breast cancer Maternal Aunt 75       colon cancer    Social History Social History   Socioeconomic History   Marital status: Single    Spouse name: Not on file   Number of children: 3   Years of education: Not on file   Highest education level: Not on file  Occupational History   Occupation: retired   Tobacco Use   Smoking  status: Every Day    Packs/day: 0.25    Years: 40.00    Pack years: 10.00    Types: Cigarettes   Smokeless tobacco: Never   Tobacco comments:    6 cigarettes smoked daily ARJ, RN 04/21/22  Vaping Use   Vaping Use: Never used  Substance and Sexual Activity   Alcohol use: Yes    Comment: socially   Drug use: No   Sexual activity: Not on file  Other Topics Concern   Not on file  Social History Narrative   Lives with daughter    Dorie Rank from [place] Determinants of Health   Financial Resource Strain: Not on file  Food Insecurity: Not on file  Transportation Needs: Not on file  Physical Activity: Not on file  Stress: Not on file  Social  Connections: Not on file  Intimate Partner Violence: Not on file     Review of Systems MSK: As noted per HPI above GI: No current Nausea/vomiting ENT: Denies sore throat, epistaxis CV: Denies chest pain  Resp: No current shortness of breath  Other than mentioned above, there are no Constitutional, Neurological, Psychiatric, ENT, Ophthalmological, Cardiovascular, Respiratory, GI, GU, Musculoskeletal, Integumentary, Lymphatic, Endocrine or Allergic issues.     Imaging  Independent interpretation of orthopaedic-relevant films: Right ankle x-ray: Bimalleolar fracture with displaced lateral malleolus and minimally displaced medial malleolus fractures Left knee x-ray: Nondisplaced patella fracture Bilateral hip x-rays: Mild degenerative changes, no fractures or other acute osseous abnormalities appreciated Left wrist x-ray: Small linear lucency in the area of the radial styloid, with no corresponding clinical pain or swelling, likely vascular channel.  Otherwise no fractures or acute osseous abnormalities appreciated. Right knee x-ray: No fractures or other acute osseous abnormalities appreciated  Radiographic results: DG Wrist Complete Left  Result Date: 07/11/2021 CLINICAL DATA:  Patient fell yesterday at home. Patient awoke this morning  unable to bear weight on the right ankle. Right ankle pain. Left knee swelling. Hip and left wrist discomfort. EXAM: LEFT WRIST - COMPLETE 3+ VIEW COMPARISON:  None. FINDINGS: There is no evidence of fracture or dislocation. There is no evidence of arthropathy or other focal bone abnormality. Soft tissues are unremarkable. IMPRESSION: Negative. Electronically Signed   By: Lajean Manes M.D.   On: 07/11/2021 11:43   DG Ankle Complete Right  Result Date: 07/11/2021 CLINICAL DATA:  Patient fell yesterday at home. Patient awoke this morning unable to bear weight on the right ankle. Right ankle pain. Left knee swelling. Hip and left wrist discomfort. EXAM: RIGHT ANKLE - COMPLETE 3+ VIEW COMPARISON:  None. FINDINGS: Bimalleolar fractures. There is a nondisplaced fracture across the base of the medial malleolus with a better defined transverse fracture across the distal fibula, just below the level of the ankle mortise. No other fractures.  Ankle joint normally spaced and aligned. Diffuse surrounding soft tissue swelling. IMPRESSION: 1. Nondisplaced fractures of the medial malleolus and distal fibula. Normally aligned ankle joint. Electronically Signed   By: Lajean Manes M.D.   On: 07/11/2021 11:40   DG Knee Complete 4 Views Left  Result Date: 07/11/2021 CLINICAL DATA:  Patient fell yesterday at home. Patient awoke this morning unable to bear weight on the right ankle. Right ankle pain. Left knee swelling. Hip and left wrist discomfort. EXAM: LEFT KNEE - COMPLETE 4+ VIEW COMPARISON:  None. FINDINGS: Nondisplaced, non comminuted fracture along the inferior aspect of the patella. No other fractures.  No bone lesions. Knee joint normally spaced and aligned. No degenerative/arthropathic changes. Small joint effusion. Mild anterior soft tissue swelling. IMPRESSION: 1. Nondisplaced, non comminuted transverse fracture across the inferior aspect of the patella, associated with a small joint effusion. Electronically Signed    By: Lajean Manes M.D.   On: 07/11/2021 11:42   DG Knee Complete 4 Views Right  Result Date: 07/11/2021 CLINICAL DATA:  Fall yesterday.  Right knee pain. EXAM: RIGHT KNEE - COMPLETE 4+ VIEW COMPARISON:  None. FINDINGS: No evidence of fracture, dislocation, or joint effusion. No evidence of arthropathy or other focal bone abnormality. Soft tissues are unremarkable. IMPRESSION: Negative. Electronically Signed   By: Lajean Manes M.D.   On: 07/11/2021 13:14   DG Hips Bilat W or Wo Pelvis 2 Views  Result Date: 07/11/2021 CLINICAL DATA:  Patient fell yesterday at home. Patient awoke this morning unable to  bear weight on the right ankle. Right ankle pain. Left knee swelling. Hip and left wrist discomfort. EXAM: DG HIP (WITH OR WITHOUT PELVIS) 2V BILAT COMPARISON:  None. FINDINGS: There is no evidence of hip fracture or dislocation. There is no evidence of arthropathy or other focal bone abnormality. IMPRESSION: Negative. Electronically Signed   By: Lajean Manes M.D.   On: 07/11/2021 11:41   Labs  Recent Labs    07/09/21 1235 07/11/21 1900 07/12/21 0503 07/13/21 0937  WBC 8.2 10.5 8.8 8.7  HGB 9.4* 9.8* 9.2* 10.1*  HCT 30.9* 32.6* 30.4* 33.0*  PLT 236 227 222 241   Recent Labs    07/09/21 1235 07/11/21 1900 07/12/21 0503 07/13/21 0937  NA 137 136 136 136  K 5.1 4.4 4.3 4.6  CL 107 104 105 105  CO2 _0 BUN 27* _1 CREATININE 1.81* 1.05* 1.11* 1.07*  GLUCOSE 163* 108* 122* 137*  CALCIUM 10.1 10.1 9.7 9.9   Lab Results  Component Value Date   INR 1.1 04/27/2020   INR 1.0 02/06/2020   INR 1.0 11/19/2019        Physical Examination  Patient is a 71 y.o. year old female who is alert, well appearing, and in no distress, mood is calm.  Orientation: oriented to person, place, time, and general circumstances  Vital Signs: BP (!) 161/60 (BP Location: Left Arm)    Pulse 65    Temp 99.3 F (37.4 C) (Oral)    Resp 18    Ht 5' 5" (1.651 m)    Wt 78 kg    SpO2 98%    BMI  28.62 kg/m    Gait: Supine on stretcher unable to ambulate  Heart: Normal rate Lungs: Non-labored breathing Abdomen: Soft, Non-tender   Right Upper Extremity: Inspection: Atraumatic Palpation: Nontender ROM: Full, painless Joint Stability: No instability Strength: Normal Skin: Intact Peripheral Vascular: Well perfused Reflexes: No pathologic Sensation: Intact to light touch distally Lymph Nodes: None Palpable Coordination: Intact, normal   Left Upper Extremity: Inspection: Atraumatic Palpation: Nontender ROM: Full, painless Joint Stability: No instability Strength: Normal Skin: Intact Peripheral Vascular: Well perfused Reflexes: No pathologic Sensation: Intact to light touch distally Lymph Nodes: None Palpable Coordination: Intact, normal    Right Lower Extremity: Inspection: Swelling over patella Palpation: Tender to palpation over patella at site of fracture ROM: Knee range of motion limited due to pain; normal ankle range of motion Joint Stability: Knee exam limited due to patellar injury; no ankle instability Strength: Normal dorsiflexion, plantarflexion, and EHL Skin: Intact Peripheral Vascular: Normal DP, warm and well-perfused Reflexes: No pathologic Sensation: Intact to light touch in superficial peroneal, deep peroneal, and tibial distributions Lymph Nodes: None Palpable Coordination: Limited due to injury   Left Lower Extremity: Inspection: Swelling over ankle Palpation: Tender to palpation over the medial and lateral malleolus ROM: Ankle range of motion limited due to pain Joint Stability: Ankle stability examination limited due to pain due to known bimalleolar fracture Strength: Dorsiflexion plantarflexion strength limited due to pain, EHL function intact Skin: Swelling present but wrinkle sign positive Peripheral Vascular: Normal DP, warm and well-perfused Reflexes: No pathologic Sensation: Intact to light touch in the superficial peroneal, deep  peroneal, tibial distributions Lymph Nodes: None Palpable Coordination: Limited due to injury    Pelvis: Skin: Intact Palpation: Nontender Stability: No instability      The review of the patient's medications does not in any way constitute an endorsement, by this clinician,  of their use, dosage, indications, route, efficacy, interactions, or other clinical parameters.  This note was generated within the [employer] EMR using Dragon medical speech recognition software and may contain inherent errors or omissions not intended by the user. Grammatical and punctuation errors, random word insertions, deletions, pronoun errors and incomplete sentences are occasional consequences of this technology due to software limitations. Not all errors are caught or corrected.  Although every attempt is made to root out erroneus and incomplete transcription, the note may still not fully represent the intent or opinion of the author. If there are questions or concerns about the content of this note or information contained within the body of this dictation they should be addressed directly with the author for clarification.

## 2021-07-13 NOTE — Anesthesia Preprocedure Evaluation (Signed)
Anesthesia Evaluation  Patient identified by MRN, date of birth, ID band Patient awake    Reviewed: Allergy & Precautions, NPO status , Patient's Chart, lab work & pertinent test results  Airway Mallampati: II  TM Distance: >3 FB Neck ROM: Full    Dental no notable dental hx.    Pulmonary COPD, Current Smoker,    Pulmonary exam normal breath sounds clear to auscultation       Cardiovascular hypertension, Normal cardiovascular exam Rhythm:Regular Rate:Normal     Neuro/Psych CVA negative psych ROS   GI/Hepatic negative GI ROS, Neg liver ROS,   Endo/Other  diabetes, Type 2Hypothyroidism   Renal/GU negative Renal ROS  negative genitourinary   Musculoskeletal negative musculoskeletal ROS (+)   Abdominal   Peds negative pediatric ROS (+)  Hematology negative hematology ROS (+)   Anesthesia Other Findings   Reproductive/Obstetrics negative OB ROS                             Anesthesia Physical Anesthesia Plan  ASA: 3  Anesthesia Plan: General   Post-op Pain Management: Regional block*   Induction: Intravenous  PONV Risk Score and Plan: 2 and Ondansetron, Dexamethasone, Treatment may vary due to age or medical condition and Midazolam  Airway Management Planned: LMA  Additional Equipment:   Intra-op Plan:   Post-operative Plan: Extubation in OR  Informed Consent: I have reviewed the patients History and Physical, chart, labs and discussed the procedure including the risks, benefits and alternatives for the proposed anesthesia with the patient or authorized representative who has indicated his/her understanding and acceptance.     Dental advisory given  Plan Discussed with: CRNA and Surgeon  Anesthesia Plan Comments:         Anesthesia Quick Evaluation

## 2021-07-13 NOTE — Anesthesia Procedure Notes (Signed)
Anesthesia Regional Block: Adductor canal block (saphenous)   Pre-Anesthetic Checklist: , timeout performed,  Correct Patient, Correct Site, Correct Laterality,  Correct Procedure, Correct Position, site marked,  Risks and benefits discussed,  Surgical consent,  Pre-op evaluation,  At surgeon's request and post-op pain management  Laterality: Right  Prep: chloraprep       Needles:  Injection technique: Single-shot  Needle Type: Echogenic Needle     Needle Length: 5cm  Needle Gauge: 22     Additional Needles:   Procedures:,,,, ultrasound used (permanent image in chart),,    Narrative:  Start time: 07/13/2021 12:04 PM End time: 07/13/2021 12:08 PM Injection made incrementally with aspirations every 5 mL.  Performed by: Personally  Anesthesiologist: Myrtie Soman, MD  Additional Notes: Patient tolerated the procedure well without complications

## 2021-07-13 NOTE — Anesthesia Procedure Notes (Signed)
Anesthesia Procedure Image    

## 2021-07-13 NOTE — Anesthesia Procedure Notes (Signed)
Date/Time: 07/13/2021 2:52 PM Performed by: Cynda Familia, CRNA Oxygen Delivery Method: Simple face mask Placement Confirmation: positive ETCO2 and breath sounds checked- equal and bilateral Dental Injury: Teeth and Oropharynx as per pre-operative assessment

## 2021-07-13 NOTE — Anesthesia Procedure Notes (Signed)
Anesthesia Regional Block: Popliteal block   Pre-Anesthetic Checklist: , timeout performed,  Correct Patient, Correct Site, Correct Laterality,  Correct Procedure, Correct Position, site marked,  Risks and benefits discussed,  Surgical consent,  Pre-op evaluation,  At surgeon's request and post-op pain management  Laterality: Right  Prep: chloraprep       Needles:  Injection technique: Single-shot  Needle Type: Echogenic Needle     Needle Length: 9cm      Additional Needles:   Procedures:,,,, ultrasound used (permanent image in chart),,    Narrative:  Start time: 07/13/2021 11:56 AM End time: 07/13/2021 12:03 PM Injection made incrementally with aspirations every 5 mL.  Performed by: Personally  Anesthesiologist: Myrtie Soman, MD  Additional Notes: Patient tolerated the procedure well without complications

## 2021-07-14 ENCOUNTER — Other Ambulatory Visit: Payer: Medicare Other

## 2021-07-14 DIAGNOSIS — N1832 Chronic kidney disease, stage 3b: Secondary | ICD-10-CM

## 2021-07-14 DIAGNOSIS — S82002A Unspecified fracture of left patella, initial encounter for closed fracture: Secondary | ICD-10-CM

## 2021-07-14 DIAGNOSIS — E039 Hypothyroidism, unspecified: Secondary | ICD-10-CM

## 2021-07-14 DIAGNOSIS — I10 Essential (primary) hypertension: Secondary | ICD-10-CM

## 2021-07-14 DIAGNOSIS — J449 Chronic obstructive pulmonary disease, unspecified: Secondary | ICD-10-CM

## 2021-07-14 DIAGNOSIS — E782 Mixed hyperlipidemia: Secondary | ICD-10-CM

## 2021-07-14 DIAGNOSIS — Z720 Tobacco use: Secondary | ICD-10-CM

## 2021-07-14 LAB — GLUCOSE, CAPILLARY
Glucose-Capillary: 327 mg/dL — ABNORMAL HIGH (ref 70–99)
Glucose-Capillary: 93 mg/dL (ref 70–99)
Glucose-Capillary: 134 mg/dL — ABNORMAL HIGH (ref 70–99)

## 2021-07-14 LAB — COMPREHENSIVE METABOLIC PANEL
ALT: 60 U/L — ABNORMAL HIGH (ref 0–44)
AST: 45 U/L — ABNORMAL HIGH (ref 15–41)
Albumin: 3.3 g/dL — ABNORMAL LOW (ref 3.5–5.0)
Alkaline Phosphatase: 272 U/L — ABNORMAL HIGH (ref 38–126)
Anion gap: 4 — ABNORMAL LOW (ref 5–15)
BUN: 22 mg/dL (ref 8–23)
CO2: 25 mmol/L (ref 22–32)
Calcium: 9.5 mg/dL (ref 8.9–10.3)
Chloride: 106 mmol/L (ref 98–111)
Creatinine, Ser: 1.05 mg/dL — ABNORMAL HIGH (ref 0.44–1.00)
GFR, Estimated: 57 mL/min — ABNORMAL LOW (ref >60)
Glucose, Bld: 304 mg/dL — ABNORMAL HIGH (ref 70–99)
Potassium: 4.4 mmol/L (ref 3.5–5.1)
Sodium: 135 mmol/L (ref 135–145)
Total Bilirubin: 0.2 mg/dL — ABNORMAL LOW (ref 0.3–1.2)
Total Protein: 6.6 g/dL (ref 6.5–8.1)

## 2021-07-14 LAB — CBC
HCT: 31.1 % — ABNORMAL LOW (ref 36.0–46.0)
Hemoglobin: 9.1 g/dL — ABNORMAL LOW (ref 12.0–15.0)
MCH: 23.8 pg — ABNORMAL LOW (ref 26.0–34.0)
MCHC: 29.3 g/dL — ABNORMAL LOW (ref 30.0–36.0)
MCV: 81.2 fL (ref 80.0–100.0)
Platelets: 239 10*3/uL (ref 150–400)
RBC: 3.83 MIL/uL — ABNORMAL LOW (ref 3.87–5.11)
RDW: 18.6 % — ABNORMAL HIGH (ref 11.5–15.5)
WBC: 13 10*3/uL — ABNORMAL HIGH (ref 4.0–10.5)
nRBC: 0 % (ref 0.0–0.2)

## 2021-07-14 MED ORDER — HYDRALAZINE HCL 25 MG PO TABS: 25.0000 mg | ORAL_TABLET | Freq: Four times a day (QID) | ORAL | Status: DC | PRN

## 2021-07-14 MED ORDER — INSULIN ASPART 100 UNIT/ML IJ SOLN
0.0000 [IU] | Freq: Three times a day (TID) | INTRAMUSCULAR | Status: DC
  Administered 2021-07-14: 2 [IU] via SUBCUTANEOUS
  Administered 2021-07-15: 5 [IU] via SUBCUTANEOUS
  Administered 2021-07-15: 3 [IU] via SUBCUTANEOUS
  Administered 2021-07-15: 2 [IU] via SUBCUTANEOUS
  Administered 2021-07-16: 3 [IU] via SUBCUTANEOUS
  Administered 2021-07-16: 2 [IU] via SUBCUTANEOUS

## 2021-07-14 MED ORDER — SODIUM CHLORIDE 0.9% FLUSH: 10.0000 mL | INTRAVENOUS | Status: DC | PRN

## 2021-07-14 MED ORDER — SODIUM CHLORIDE 0.9% FLUSH
10.0000 mL | Freq: Two times a day (BID) | INTRAVENOUS | Status: DC
  Administered 2021-07-16: 10 mL

## 2021-07-14 MED ORDER — INSULIN ASPART 100 UNIT/ML IJ SOLN
4.0000 [IU] | Freq: Three times a day (TID) | INTRAMUSCULAR | Status: DC
  Administered 2021-07-14: 4 [IU] via SUBCUTANEOUS

## 2021-07-14 MED ORDER — ENSURE SURGERY PO LIQD
237.0000 mL | Freq: Two times a day (BID) | ORAL | Status: DC
  Administered 2021-07-14 – 2021-07-16 (×4): 237 mL via ORAL
  Filled 2021-07-14 (×5): qty 237

## 2021-07-14 MED ORDER — INSULIN GLARGINE-YFGN 100 UNIT/ML ~~LOC~~ SOLN
10.0000 [IU] | Freq: Two times a day (BID) | SUBCUTANEOUS | Status: DC
Start: 2021-07-14 — End: 2021-07-16
  Administered 2021-07-14 – 2021-07-16 (×4): 10 [IU] via SUBCUTANEOUS
  Filled 2021-07-14 (×5): qty 0.1

## 2021-07-14 NOTE — Evaluation (Signed)
Occupational Therapy Evaluation Patient Details Name: Bethany [name] MRN: [id number] DOB: 1951/01/03 Today's Date: 07/14/2021   History of Present Illness Patient is a 71 year old female who presented after a fall at home. patient was found to have nondisplaced fractures involving medial malleolus and distal fibula on the right as well as nondisplaced and noncomminuted transverse fracture across the inferior aspect of the patella on the left. Patient underwent L ankle ORIF on 07/13/21.  PMH: COPD, DM II, HLD, tobacco abuse, L MCA CVA 11/2019, nephrectomy,   Clinical Impression   Patient is a 71 year old female who was admitted for above. Patient reported living at home independently prior level. Currently, patient is +2 to attempt to stand with inability to transition into upright standing off edge of bed. Patient was noted to have decreased functional activity tolerance, limited ROM of LLE with KI in place, decreased endurance, increased pain, decreased safety awareness, and decreased ability to coordinate cues to participate in ADLs. Patient would continue to benefit from skilled OT services at this time while admitted and after d/c to address noted deficits in order to improve overall safety and independence in ADLs.        Recommendations for follow up therapy are one component of a multi-disciplinary discharge planning process, led by the attending physician.  Recommendations may be updated based on patient status, additional functional criteria and insurance authorization.   Follow Up Recommendations  Acute inpatient rehab (3hours/day)    Assistance Recommended at Discharge Frequent or constant Supervision/Assistance  Patient can return home with the following Two people to help with walking and/or transfers;Two people to help with bathing/dressing/bathroom;Direct supervision/assist for medications management;Help with stairs or ramp for entrance;Assist for transportation;Direct  supervision/assist for financial management;Assistance with cooking/housework    Functional Status Assessment  Patient has had a recent decline in their functional status and demonstrates the ability to make significant improvements in function in a reasonable and predictable amount of time.  Equipment Recommendations  Other (comment) (TBD)    Recommendations for Other Services       Precautions / Restrictions Precautions Precautions: Fall Precaution Comments: LLE- KI at all times, WBAT Required Braces or Orthoses: Knee Immobilizer - Left Knee Immobilizer - Left: On at all times Restrictions Weight Bearing Restrictions: Yes RLE Weight Bearing: Non weight bearing      Mobility Bed Mobility Overal bed mobility: Needs Assistance Bed Mobility: Supine to Sit, Sit to Supine     Supine to sit: HOB elevated, Min assist, +2 for safety/equipment Sit to supine: +2 for safety/equipment, +2 for physical assistance   General bed mobility comments: patient needed physical assistance for supine to sit for BLE movement. patient was able to engage in some scooting to get to the edge of bed. patient was +3 to return to supine after standing attempt to prevent sliding.    Transfers                          Balance Overall balance assessment: Needs assistance Sitting-balance support: Feet supported Sitting balance-Leahy Scale: Fair         Standing balance comment: unable                           ADL either performed or assessed with clinical judgement   ADL Overall ADL's : Needs assistance/impaired Eating/Feeding: Set up;Bed level   Grooming: Wash/dry face;Oral care;Set up;Sitting Grooming Details (indicate cue  type and reason): EOB Upper Body Bathing: Minimal assistance;Sitting Upper Body Bathing Details (indicate cue type and reason): EOB with increased time and cues for sitting balance Lower Body Bathing: Maximal assistance;Bed level   Upper Body  Dressing : Set up;Sitting Upper Body Dressing Details (indicate cue type and reason): EOB Lower Body Dressing: Maximal assistance;Bed level   Toilet Transfer: +2 for physical assistance;+2 for safety/equipment Toilet Transfer Details (indicate cue type and reason): patient attempted to stand with ability to clear bottomg with TD +2 at this time unable to come into standing. +3 to transition back to bed at this time. KI keeping LLE straight limiting power from LE to stand up. patient had difficulty engaging BUE to participate in task. Toileting- Clothing Manipulation and Hygiene: Total assistance;Bed level       Functional mobility during ADLs: +2 for safety/equipment;+2 for physical assistance       Vision Patient Visual Report: No change from baseline       Perception     Praxis      Pertinent Vitals/Pain Pain Assessment Pain Assessment: Faces Faces Pain Scale: Hurts whole lot Pain Location: L ankle Pain Descriptors / Indicators: Contraction, Pressure, Discomfort, Grimacing Pain Intervention(s): Monitored during session, Repositioned, Limited activity within patient's tolerance     Hand Dominance Right   Extremity/Trunk Assessment Upper Extremity Assessment Upper Extremity Assessment: RUE deficits/detail;LUE deficits/detail RUE Deficits / Details: history of CVA effecting this side. strength WFL at this time. LUE Deficits / Details: ROM WFL strength WFL   Lower Extremity Assessment Lower Extremity Assessment: Defer to PT evaluation   Cervical / Trunk Assessment Cervical / Trunk Assessment: Normal   Communication Communication Communication: No difficulties   Cognition Arousal/Alertness: Awake/alert Behavior During Therapy: WFL for tasks assessed/performed Overall Cognitive Status: Difficult to assess                                 General Comments: patient was alert and oriented but noted to have some difficulty with processing cues to assist with  movement.     General Comments       Exercises     Shoulder Instructions      Home Living Family/patient expects to be discharged to:: Private residence Living Arrangements: Alone Available Help at Discharge: Family;Available PRN/intermittently Type of Home: Apartment Home Access: Stairs to enter Entrance Stairs-Number of Steps: 3 Entrance Stairs-Rails: None Home Layout: One level     Bathroom Shower/Tub: Teacher, early years/pre: Standard     Home Equipment: Shower seat          Prior Functioning/Environment Prior Level of Function : Independent/Modified Independent;Driving             Mobility Comments: Could ambulate in community without AD ADLs Comments: Very independent        OT Problem List: Decreased activity tolerance;Decreased safety awareness;Decreased strength;Decreased knowledge of use of DME or AE;Decreased knowledge of precautions      OT Treatment/Interventions: Self-care/ADL training;Therapeutic exercise;Energy conservation;DME and/or AE instruction;Therapeutic activities;Balance training;Patient/family education;Neuromuscular education    OT Goals(Current goals can be found in the care plan section) Acute Rehab OT Goals Patient Stated Goal: to get back home OT Goal Formulation: With patient Time For Goal Achievement: 07/28/21 Potential to Achieve Goals: Good  OT Frequency: Min 2X/week    Co-evaluation PT/OT/SLP Co-Evaluation/Treatment: Yes Reason for Co-Treatment: For patient/therapist safety;To address functional/ADL transfers PT goals addressed during session: Mobility/safety with mobility  OT goals addressed during session: ADL's and self-care      AM-PAC OT "6 Clicks" Daily Activity     Outcome Measure Help from another person eating meals?: A Little Help from another person taking care of personal grooming?: A Little Help from another person toileting, which includes using toliet, bedpan, or urinal?: Total Help from  another person bathing (including washing, rinsing, drying)?: A Lot Help from another person to put on and taking off regular upper body clothing?: A Little Help from another person to put on and taking off regular lower body clothing?: Total 6 Click Score: 13   End of Session Equipment Utilized During Treatment: Rolling walker (2 wheels);Gait belt Nurse Communication: Mobility status  Activity Tolerance: Patient tolerated treatment well Patient left: in bed;with call bell/phone within reach;with bed alarm set;with nursing/sitter in room  OT Visit Diagnosis: Unsteadiness on feet (R26.81);History of falling (Z91.81);Muscle weakness (generalized) (M62.81)                Time: 0037-0488 OT Time Calculation (min): 35 min Charges:  OT General Charges $OT Visit: 1 Visit OT Evaluation $OT Eval Moderate Complexity: 1 Mod  Jackelyn Poling OTR/L, MS Acute Rehabilitation Department Office# [phone] Pager# [phone]   Marcellina Millin 07/14/2021, 1:34 PM

## 2021-07-14 NOTE — Anesthesia Postprocedure Evaluation (Signed)
Anesthesia Post Note ? ?Patient: Bethany [name] ? ?Procedure(s) Performed: OPEN REDUCTION INTERNAL FIXATION (ORIF) ANKLE FRACTURE (Ankle) ? ?  ? ?Patient location during evaluation: PACU ?Anesthesia Type: General ?Level of consciousness: awake and alert ?Pain management: pain level controlled ?Vital Signs Assessment: post-procedure vital signs reviewed and stable ?Respiratory status: spontaneous breathing, nonlabored ventilation, respiratory function stable and patient connected to nasal cannula oxygen ?Cardiovascular status: blood pressure returned to baseline and stable ?Postop Assessment: no apparent nausea or vomiting ?Anesthetic complications: no ? ? ?No notable events documented. ? ?Last Vitals:  ?Vitals:  ? 07/13/21 2130 07/14/21 0125  ?BP: (!) 153/58 (!) 133/54  ?Pulse: 70 (!) 54  ?Resp: 18 18  ?Temp: 36.7 ?C 36.7 ?C  ?SpO2: 99% 96%  ?  ?Last Pain:  ?Vitals:  ? 07/14/21 0125  ?TempSrc: Oral  ?PainSc:   ? ? ?  ?  ?  ?  ?  ?  ? ?Kalaysia Demonbreun S ? ? ? ? ?

## 2021-07-14 NOTE — Progress Notes (Signed)
Orthopaedics Daily Progress Note ? ? ?07/14/2021   8:15 AM ? ?Bethany [name] is a 71 y.o. female 1 Day Post-Op s/p OPEN REDUCTION INTERNAL FIXATION (ORIF) ANKLE FRACTURE ? ?Subjective ?Pain appropriately controlled.  Denies nausea, vomiting, or fevers.  ? ?Objective ?Vitals:  ? 07/14/21 0125 07/14/21 0559  ?BP: (!) 133/54 (!) 139/53  ?Pulse: (!) 54 61  ?Resp: 18 18  ?Temp: 98.1 ?F (36.7 ?C) 98.1 ?F (36.7 ?C)  ?SpO2: 96% 99%  ? ? ?Intake/Output Summary (Last 24 hours) at 07/14/2021 0815 ?Last data filed at 07/14/2021 0500 ?Gross per 24 hour  ?Intake 1250 ml  ?Output 1125 ml  ?Net 125 ml  ? ? ?Physical Exam ?RLE: ?Splint clean, dry, and intact ?+Toe dorsiflexion/plantar flexion ?SILT SP/DP/T ?WWP distally ? ?LLE: ?Knee immobilizer appropriately positioned ?+DF/PF/EHL ?SILT SP/DP/T ?WWP distally ? ?Assessment ?70 y.o. female s/p Procedure(s) (LRB): ?OPEN REDUCTION INTERNAL FIXATION (ORIF) ANKLE FRACTURE (N/A) ? ?Plan ?Mobility: Out of bed with PT/OT ?Pain control: Continue to wean/titrate to appropriate oral regimen ?DVT Prophylaxis: No pharmacologic DVT prophylaxis due to history of GI bleeds ?Further surgical plans: None ?RUE: Weightbearing as tolerated, no restrictions ?LUE: Weightbearing as tolerated, no restrictions ?RLE: Nonweightbearing in a splint ?LLE: Weightbearing as tolerated in knee immobilizer at all times ?Disposition: Per primary team ?Dressing care:  Keep splint on right ankle until seen in clinic.  Keep splint clean, dry, and intact. ?Follow-up: Please call [facility] [phone]) to schedule follow-op appointment within 2 weeks after surgery. ? ?I have verified that my discharge instructions and follow-up information have been entered in the Discharge Navigator in Epic.  These should automatically populate in the AVS.  Please print the AVS in its entirety and ensure that the patient or a responsible party has a complete copy of the AVS before they are discharged.  If there  are questions regarding discharge instructions or follow-up before the AVS is generated, please check the Discharge Navigator before attempting to contact the surgeon/office.  If unsure how to access the Discharge Navigator or the information contained in the Discharge Navigator, or how to generate/print the AVS, please contact the appropriate Nurse, learning disability. ? ? ?Georgeanna Harrison M.D. ?Orthopaedic Surgery ?[facility]  ?

## 2021-07-14 NOTE — Assessment & Plan Note (Addendum)
-  Pain control and WBAT with knee immobilizer per orthopedic surgery ?

## 2021-07-14 NOTE — Hospital Course (Addendum)
71 year old F with PMH of IDDM-2, HTN, HLD, chronic back pain/arthritis on opiate, COPD, tobacco use disorder and IDA presenting with left knee pain and right ankle pain after accidental fall walking down the stairs, and admitted for right ankle (bimalleolar) fracture and left nondisplaced patellar fracture. ?Patient underwent ORIF for right ankle fracture by Dr. Mable Fill on 07/13/2021.  She is NWB on RLE and WBAT with knee immobilizer on LLE.  Therapy recommended CIR but didn't meet criteria.  Discharged to SNF.  ? ?Outpatient follow-up with orthopedic surgery in 2 weeks ?

## 2021-07-14 NOTE — Progress Notes (Addendum)
? ?  Inpatient Rehab Admissions Coordinator : ? ?Per therapy recommendations patient was screened for CIR candidacy by Danne Baxter RN MSN. Patient unlikely to be able to tolerate the intensity required of Cir admit with her orthopedic restrictions nor is it likely that [facility] will approve admit for her diagnosis. Recommend other rehab venues to be pursued. Please contact me with any questions. TOC made aware by secure chat, Alinda Sierras, RN CM. ? ?Danne Baxter RN MSN ?Admissions Coordinator ?[phone]  ?

## 2021-07-14 NOTE — Progress Notes (Signed)
PROGRESS NOTE  Bethany [name] AQT:[id number] DOB: [birth year]   PCP: Arthur Holms, NP  Patient is from: Home  DOA: 07/11/2021 LOS: 3  Chief complaints:  No chief complaint on file.    Brief Narrative / Interim history: 71 year old F with PMH of DM-2, HTN, HLD, arthritis, COPD, tobacco use disorder and IDA presenting with left knee pain and right ankle pain after accidental fall walking down the stairs, and admitted for right ankle (bimalleolar) fracture and left nondisplaced patellar fracture. Patient underwent ORIF for right ankle fracture by Dr. Mable Fill on 07/13/2021.  She is NWB on RLE and WBAT with knee immobilizer on LLE.  Therapy recommended CIR.   Subjective: Seen and examined earlier this morning.  No major events overnight of this morning.  She states she was in a lot of pain but pain improved after pain medication.  She rates her pain 5/10 at the moment.  No other complaints.  Objective: Vitals:   07/14/21 0125 07/14/21 0559 07/14/21 0847 07/14/21 1429  BP: (!) 133/54 (!) 139/53 (!) 156/61 140/62  Pulse: (!) 54 61 (!) 56 (!) 58  Resp: 18 18  18   Temp: 98.1 F (36.7 C) 98.1 F (36.7 C) 98.8 F (37.1 C) 98.5 F (36.9 C)  TempSrc: Oral Oral Oral Oral  SpO2: 96% 99% 100% 96%  Weight:      Height:        Examination:  GENERAL: No apparent distress.  Nontoxic. HEENT: MMM.  Vision and hearing grossly intact.  NECK: Supple.  No apparent JVD.  RESP:  No IWOB.  Fair aeration bilaterally. CVS:  RRR. Heart sounds normal.  ABD/GI/GU: BS+. Abd soft, NTND.  MSK/EXT:  Moves extremities.  Bulky dressing over RLE.  Knee immobilizer over left leg SKIN: no apparent skin lesion or wound NEURO: Awake, alert and oriented appropriately.  No apparent focal neuro deficit. PSYCH: Calm. Normal affect.   Procedures:  2/28-ORIF of right ankle fracture  Microbiology summarized: COVID-19 and influenza PCR nonreactive.  Assessment and Plan: * Closed right ankle fracture, initial  encounter- (present on admission) Imaging revealed bimalleolar fracture -S/p ORIF by Dr. Mable Fill on 2/28 -NWB on RLE -Pain control and VTE prophylaxis per surgery -Bowel regimen to avoid constipation -PT/OT-recommended CIR.  Nondisplaced fracture of left patella -Pain control and WBAT with knee immobilizer per orthopedic surgery  Tobacco use- (present on admission) Reports smoking about 10 cigarettes a day for about 45 years -Encourage cessation. -Nicotine patch if needed  Essential hypertension- (present on admission) BP within acceptable range. -Continue amlodipine, metoprolol and losartan. -Change IV hydralazine to p.o. as needed  Hypothyroidism- (present on admission) Stable.  Continue home Synthroid  COPD (chronic obstructive pulmonary disease) ([place])- (present on admission) Stable. -DuoNeb as needed  Hyperlipemia, mixed- (present on admission) - Continue home Lipitor  Diabetes mellitus type 2 with neurological manifestations ([place])- (present on admission) A1c 5.2%. Recent Labs  Lab 07/13/21 1509 07/13/21 1618 07/13/21 2125 07/14/21 0728 07/14/21 1155  GLUCAP 147* 149* 334* 327* 93  -Continue SSI-moderate -Discontinue mealtime coverage -Increase basal insulin to 10 units twice daily -Further adjustment as appropriate   Chronic kidney disease (CKD), stage III (moderate)- (present on admission) Recent Labs    05/07/21 1217 05/14/21 1208 05/26/21 1312 06/23/21 1157 07/02/21 1252 07/09/21 1235 07/11/21 1900 07/12/21 0503 07/13/21 0937 07/14/21 0645  BUN 33* 25* 20 25* 19 27* 18 18 19 22   CREATININE 2.00* 1.82* 1.23* 1.40* 1.11* 1.81* 1.05* 1.11* 1.07* 1.05*  Stable.  Continue monitoring  Iron deficiency anemia secondary to blood loss (chronic)- (present on admission) Recent Labs    05/14/21 1208 05/26/21 1312 06/22/21 1248 06/23/21 1157 07/02/21 1252 07/09/21 1235 07/11/21 1900 07/12/21 0503 07/13/21 0937 07/14/21 0645  HGB 8.9* 9.9* 11.3*  10.2* 10.0* 9.4* 9.8* 9.2* 10.1* 9.1*  H&H seems to be at baseline.  Followed by hematology and gets iron infusions. -Monitor -Check anemia panel in the morning          Body mass index is 28.62 kg/m. Nutrition Problem: Increased nutrient needs Etiology: post-op healing (ankle fracture) Signs/Symptoms: estimated needs Interventions: Ensure Surgery   DVT prophylaxis:  SCDs Start: 07/13/21 1601 Place and maintain sequential compression device Start: 07/11/21 1808  Code Status: Full code Family Communication: Patient and/or RN. Available if any question.  Level of care: Telemetry Status is: Inpatient Remains inpatient appropriate because: Lack of safe disposition     Final disposition: CIR/SNF      Consultants:  Orthopedic surgery   Sch Meds:  Scheduled Meds:  acetaminophen  500 mg Oral Q6H   amLODipine  10 mg Oral Daily   atorvastatin  40 mg Oral Daily   Chlorhexidine Gluconate Cloth  6 each Topical Daily   docusate sodium  100 mg Oral BID   ezetimibe  10 mg Oral Daily   famotidine  20 mg Oral BID   feeding supplement  237 mL Oral BID BM   gabapentin  200 mg Oral QID   insulin aspart  0-15 Units Subcutaneous TID WC   insulin glargine-yfgn  10 Units Subcutaneous BID   levothyroxine  75 mcg Oral QAC breakfast   losartan  25 mg Oral Daily   metoprolol tartrate  50 mg Oral BID   mupirocin ointment  1 application Nasal BID   neomycin-bacitracin-polymyxin   Topical BID   sodium chloride flush  10-40 mL Intracatheter Q12H   Tdap  0.5 mL Intramuscular Once   Continuous Infusions:  methocarbamol (ROBAXIN) IV 500 mg (07/12/21 1016)   PRN Meds:.acetaminophen **OR** acetaminophen, acetaminophen, camphor-menthol, diphenhydrAMINE, heparin lock flush, hydrALAZINE, HYDROcodone-acetaminophen, HYDROcodone-acetaminophen, HYDROcodone-acetaminophen, HYDROmorphone (DILAUDID) injection, ipratropium-albuterol, methocarbamol (ROBAXIN) IV, morphine injection, ondansetron **OR**  ondansetron (ZOFRAN) IV, sodium chloride flush  Antimicrobials: Anti-infectives (From admission, onward)    Start     Dose/Rate Route Frequency Ordered Stop   07/13/21 1800  ceFAZolin (ANCEF) IVPB 1 g/50 mL premix        1 g 100 mL/hr over 30 Minutes Intravenous Every 6 hours 07/13/21 1600 07/14/21 0705   07/13/21 1130  ceFAZolin (ANCEF) IVPB 2g/100 mL premix        2 g 200 mL/hr over 30 Minutes Intravenous On call to O.R. 07/13/21 1033 07/13/21 1252        I have personally reviewed the following labs and images: CBC: Recent Labs  Lab 07/09/21 1235 07/11/21 1900 07/12/21 0503 07/13/21 0937 07/14/21 0645  WBC 8.2 10.5 8.8 8.7 13.0*  NEUTROABS 5.6  --   --   --   --   HGB 9.4* 9.8* 9.2* 10.1* 9.1*  HCT 30.9* 32.6* 30.4* 33.0* 31.1*  MCV 78.4* 78.7* 79.4* 79.3* 81.2  PLT 236 227 222 241 239   BMP &GFR Recent Labs  Lab 07/09/21 1235 07/11/21 1900 07/12/21 0503 07/13/21 0937 07/14/21 0645  NA 137 136 136 136 135  K 5.1 4.4 4.3 4.6 4.4  CL 107 104 105 105 106  CO2 26 28 27 26 25   GLUCOSE 163* 108* 122* 137* 304*  BUN 27* 18 18  19 22  CREATININE 1.81* 1.05* 1.11* 1.07* 1.05*  CALCIUM 10.1 10.1 9.7 9.9 9.5   Estimated Creatinine Clearance: 50.7 mL/min (A) (by C-G formula based on SCr of 1.05 mg/dL (H)). Liver & Pancreas: Recent Labs  Lab 07/09/21 1235 07/12/21 0503 07/13/21 0937 07/14/21 0645  AST 23 25 22  45*  ALT 28 39 32 60*  ALKPHOS 173* 168* 172* 272*  BILITOT 0.3 0.3 0.3 0.2*  PROT 6.6 6.7 6.9 6.6  ALBUMIN 3.9 3.4* 3.4* 3.3*   No results for input(s): LIPASE, AMYLASE in the last 168 hours. No results for input(s): AMMONIA in the last 168 hours. Diabetic: Recent Labs    07/12/21 0503  HGBA1C 5.2   Recent Labs  Lab 07/13/21 1509 07/13/21 1618 07/13/21 2125 07/14/21 0728 07/14/21 1155  GLUCAP 147* 149* 334* 327* 93   Cardiac Enzymes: No results for input(s): CKTOTAL, CKMB, CKMBINDEX, TROPONINI in the last 168 hours. No results for  input(s): PROBNP in the last 8760 hours. Coagulation Profile: No results for input(s): INR, PROTIME in the last 168 hours. Thyroid Function Tests: No results for input(s): TSH, T4TOTAL, FREET4, T3FREE, THYROIDAB in the last 72 hours. Lipid Profile: No results for input(s): CHOL, HDL, LDLCALC, TRIG, CHOLHDL, LDLDIRECT in the last 72 hours. Anemia Panel: No results for input(s): VITAMINB12, FOLATE, FERRITIN, TIBC, IRON, RETICCTPCT in the last 72 hours. Urine analysis:    Component Value Date/Time   COLORURINE STRAW (A) 05/05/2020 0828   APPEARANCEUR CLEAR 05/05/2020 0828   LABSPEC 1.017 05/05/2020 0828   PHURINE 6.0 05/05/2020 0828   GLUCOSEU NEGATIVE 05/05/2020 0828   HGBUR NEGATIVE 05/05/2020 0828   BILIRUBINUR NEGATIVE 05/05/2020 0828   KETONESUR NEGATIVE 05/05/2020 0828   PROTEINUR NEGATIVE 05/05/2020 0828   NITRITE NEGATIVE 05/05/2020 0828   LEUKOCYTESUR NEGATIVE 05/05/2020 0828   Sepsis Labs: Invalid input(s): PROCALCITONIN, [place]  Microbiology: Recent Results (from the past 240 hour(s))  Resp Panel by RT-PCR (Flu A&B, Covid) Nasopharyngeal Swab     Status: None   Collection Time: 07/11/21 12:40 PM   Specimen: Nasopharyngeal Swab; Nasopharyngeal(NP) swabs in vial transport medium  Result Value Ref Range Status   SARS Coronavirus 2 by RT PCR NEGATIVE NEGATIVE Final    Comment: (NOTE) SARS-CoV-2 target nucleic acids are NOT DETECTED.  The SARS-CoV-2 RNA is generally detectable in upper respiratory specimens during the acute phase of infection. The lowest concentration of SARS-CoV-2 viral copies this assay can detect is 138 copies/mL. A negative result does not preclude SARS-Cov-2 infection and should not be used as the sole basis for treatment or other patient management decisions. A negative result may occur with  improper specimen collection/handling, submission of specimen other than nasopharyngeal swab, presence of viral mutation(s) within the areas targeted  by this assay, and inadequate number of viral copies(<138 copies/mL). A negative result must be combined with clinical observations, patient history, and epidemiological information. The expected result is Negative.  Fact Sheet for Patients:  EntrepreneurPulse.com.au  Fact Sheet for Healthcare Providers:  IncredibleEmployment.be  This test is no t yet approved or cleared by the [place] FDA and  has been authorized for detection and/or diagnosis of SARS-CoV-2 by FDA under an Emergency Use Authorization (EUA). This EUA will remain  in effect (meaning this test can be used) for the duration of the COVID-19 declaration under Section 564(b)(1) of the Act, 21 U.S.C.section 360bbb-3(b)(1), unless the authorization is terminated  or revoked sooner.       Influenza A by PCR NEGATIVE NEGATIVE Final  Influenza B by PCR NEGATIVE NEGATIVE Final    Comment: (NOTE) The Xpert Xpress SARS-CoV-2/FLU/RSV plus assay is intended as an aid in the diagnosis of influenza from Nasopharyngeal swab specimens and should not be used as a sole basis for treatment. Nasal washings and aspirates are unacceptable for Xpert Xpress SARS-CoV-2/FLU/RSV testing.  Fact Sheet for Patients: EntrepreneurPulse.com.au  Fact Sheet for Healthcare Providers: IncredibleEmployment.be  This test is not yet approved or cleared by the [place] FDA and has been authorized for detection and/or diagnosis of SARS-CoV-2 by FDA under an Emergency Use Authorization (EUA). This EUA will remain in effect (meaning this test can be used) for the duration of the COVID-19 declaration under Section 564(b)(1) of the Act, 21 U.S.C. section 360bbb-3(b)(1), unless the authorization is terminated or revoked.  Performed at [facility], [address] [place], [place] [address]   Surgical PCR screen     Status: None   Collection Time:  07/11/21 11:00 PM   Specimen: Nasal Mucosa; Nasal Swab  Result Value Ref Range Status   MRSA, PCR NEGATIVE NEGATIVE Final   Staphylococcus aureus NEGATIVE NEGATIVE Final    Comment: (NOTE) The Xpert SA Assay (FDA approved for NASAL specimens in patients 73 years of age and older), is one component of a comprehensive surveillance program. It is not intended to diagnose infection nor to guide or monitor treatment. Performed at [facility], [place] [address] [place], [place] [address]     Radiology Studies: No results found.    Ezekial Arns T. [place]  If 7PM-7AM, please contact night-coverage [email or web address] 07/14/2021, 4:09 PM

## 2021-07-14 NOTE — Progress Notes (Signed)
Initial Nutrition Assessment ? ?INTERVENTION:  ? ?-Ensure Surgery PO BID, each provides 330 kcals and 18g protein  ? ?NUTRITION DIAGNOSIS:  ? ?Increased nutrient needs related to post-op healing (ankle fracture) as evidenced by estimated needs. ? ?GOAL:  ? ?Patient will meet greater than or equal to 90% of their needs ? ?MONITOR:  ? ?PO intake, Supplement acceptance, Labs, Weight trends, I & O's ? ?REASON FOR ASSESSMENT:  ? ?Malnutrition Screening Tool ?  ? ?ASSESSMENT:  ? ?71 y.o. female with medical history significant of DM2, htn, hld, arthritis who presents after fall on the day of admit. ? ?2/28: s/p ORIF right ankle fracture ? ?Patient now on solid diet. States she is in a lot of pain currently. PTA she was eating well with good appetite. Reports she controlled her diabetes well. Concerned her blood sugars have elevated to 300s. Explained that this can result after acute injuries.  ?Pt was interested in receiving Ensure supplements to aid in post-op healing.  ? ?Per weight records, no significant weight changes noted.  ? ?Medications: Colace, Pepcid ? ?Labs reviewed: ? CBGs: 93-334 ? ?NUTRITION - FOCUSED PHYSICAL EXAM: ? ?No depletions noted. ? ?Diet Order:   ?Diet Order   ? ?       ?  Diet Carb Modified Fluid consistency: Thin; Room service appropriate? Yes  Diet effective now       ?  ? ?  ?  ? ?  ? ? ?EDUCATION NEEDS:  ? ?Education needs have been addressed ? ?Skin:  Skin Assessment: Skin Integrity Issues: ?Skin Integrity Issues:: Incisions ?Incisions: 2/28 rt ankle ? ?Last BM:  2/28 -type 2 ? ?Height:  ? ?Ht Readings from Last 1 Encounters:  ?07/11/21 5\' 5"  (1.651 m)  ? ? ?Weight:  ? ?Wt Readings from Last 1 Encounters:  ?07/11/21 78 kg  ? ? ?BMI:  Body mass index is 28.62 kg/m?. ? ?Estimated Nutritional Needs:  ? ?Kcal:  1700-1900 ? ?Protein:  85-95g ? ?Fluid:  1.9L/day ? ?Clayton Bibles, MS, RD, LDN ?Inpatient Clinical Dietitian ?Contact information available via Amion ? ?

## 2021-07-14 NOTE — Evaluation (Signed)
Physical Therapy Evaluation Patient Details Name: Bethany [name] MRN: [id number] DOB: 1950/08/30 Today's Date: 07/14/2021  History of Present Illness  Patient is a 71 year old female who presented after a fall at home. patient was found to have nondisplaced fractures involving medial malleolus and distal fibula on the right as well as nondisplaced and noncomminuted transverse fracture across the inferior aspect of the patella on the left. Patient underwent L ankle ORIF on 07/13/21.  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  Pt with R breast lumpectomy 9/22, has port and RN reports was getting chemo  Clinical Impression  Pt admitted with above diagnosis. At baseline, pt is independent.  Today, pt able to get to EOB with assist for legs but unable to progress further with max x 2.  Pt seemed to have difficulty coordinating movements for transfers/following cues - question if distracted by pain vs effects of pain meds.  Pt with little assist with UE to assist with transfers despite good strength. Hopeful for ability to progress to lateral scoots as pain control improves.  Standing will be difficulty as pt with KI on L leg and NWB on R leg.  Pt currently with functional limitations due to the deficits listed below (see PT Problem List). Pt will benefit from skilled PT to increase their independence and safety with mobility to allow discharge to the venue listed below.       Pt on 3 L O2 with sats 100%.  Tried RA and sats maintained 98-100%.  Left on RA.   Recommendations for follow up therapy are one component of a multi-disciplinary discharge planning process, led by the attending physician.  Recommendations may be updated based on patient status, additional functional criteria and insurance authorization.  Follow Up Recommendations Acute inpatient rehab (3hours/day)    Assistance Recommended at Discharge Frequent or constant Supervision/Assistance  Patient can return home with  the following  Two people to help with bathing/dressing/bathroom;Two people to help with walking and/or transfers;Help with stairs or ramp for entrance;Assistance with Forensic psychologist (measurements PT);Wheelchair cushion (measurements PT);BSC/3in1;Rolling walker (2 wheels) (with drop arms)  Recommendations for Other Services  Rehab consult    Functional Status Assessment Patient has had a recent decline in their functional status and demonstrates the ability to make significant improvements in function in a reasonable and predictable amount of time.     Precautions / Restrictions Precautions Precautions: Fall Precaution Comments: LLE- KI at all times, WBAT Required Braces or Orthoses: Knee Immobilizer - Left;Splint/Cast Knee Immobilizer - Left: On at all times Splint/Cast: Splint on R ankle Restrictions Weight Bearing Restrictions: Yes RLE Weight Bearing: Non weight bearing LLE Weight Bearing: Weight bearing as tolerated      Mobility  Bed Mobility   Bed Mobility: Supine to Sit, Sit to Supine     Supine to sit: HOB elevated, Min assist, +2 for safety/equipment Sit to supine: +2 for safety/equipment, +2 for physical assistance, Total assist   General bed mobility comments: Pt requiring cues and min A for legs for transfer to EOB, increased cues to scoot forward.  For return to supine, pt near EOB and kept leaning back despite cues - required total x 2 for transfer back to prevent sliding    Transfers Overall transfer level: Needs assistance                 General transfer comment: Attempted sit to stand with RW and bed significantly  elevated.  Difficult as pt has to keep L knee straight and NWB on R.  Pt was unable to come up over L leg with max x 2 - pt not assisting well with UE.  Also, attempted scooting at EOB but pt again with difficulty using UE to assist, kept leaning back, unable to scoot.    Ambulation/Gait                   Stairs            Wheelchair Mobility    Modified Rankin (Stroke Patients Only)       Balance Overall balance assessment: Needs assistance Sitting-balance support: Feet supported Sitting balance-Leahy Scale: Fair         Standing balance comment: unable                             Pertinent Vitals/Pain Pain Assessment Pain Assessment: Faces Faces Pain Scale: Hurts whole lot Pain Location: R ankle Pain Descriptors / Indicators: Contraction, Pressure, Discomfort, Grimacing Pain Intervention(s): Limited activity within patient's tolerance, Monitored during session, Ice applied    Home Living Family/patient expects to be discharged to:: Private residence Living Arrangements: Alone Available Help at Discharge: Family;Available PRN/intermittently Type of Home: Apartment Home Access: Stairs to enter Entrance Stairs-Rails: None Entrance Stairs-Number of Steps: 3   Home Layout: One level Home Equipment: Shower seat      Prior Function Prior Level of Function : Independent/Modified Independent;Driving             Mobility Comments: Could ambulate in community without AD ADLs Comments: Very independent     Hand Dominance   Dominant Hand: Right    Extremity/Trunk Assessment   Upper Extremity Assessment Upper Extremity Assessment: Overall WFL for tasks assessed (Pt had difficulty utilizing UE for transfer but testing with good tricep and shoulder strength) RUE Deficits / Details: history of CVA effecting this side. strength WFL at this time. LUE Deficits / Details: ROM WFL strength WFL    Lower Extremity Assessment Lower Extremity Assessment: LLE deficits/detail;RLE deficits/detail RLE Deficits / Details: ROM: ankle in splint, min ROM of toes, knee and hip WFL; MMT: 3/5 toes, 2/5 knee and hip LLE Deficits / Details: ROM: ankle WFL, knee in KI, hip WFL; MMT: ankle 3/5, knee not tested, hip 2/5    Cervical / Trunk  Assessment Cervical / Trunk Assessment: Normal  Communication   Communication: No difficulties  Cognition Arousal/Alertness: Awake/alert Behavior During Therapy: WFL for tasks assessed/performed Overall Cognitive Status: Impaired/Different from baseline Area of Impairment: Safety/judgement, Problem solving                         Safety/Judgement: Decreased awareness of safety   Problem Solving: Slow processing, Difficulty sequencing, Requires verbal cues, Requires tactile cues General Comments: Pt alert and oriented but noted to have difficulty processing cues, decreased safety awareness, and difficulty with coordinating movement.  Possibly related to pain meds or internally distracted by pain        General Comments      Exercises     Assessment/Plan    PT Assessment Patient needs continued PT services  PT Problem List Decreased strength;Decreased mobility;Decreased safety awareness;Decreased range of motion;Decreased activity tolerance;Decreased cognition;Decreased balance;Decreased knowledge of use of DME;Decreased coordination       PT Treatment Interventions DME instruction;Therapeutic activities;Therapeutic exercise;Patient/family education;Balance training;Functional mobility training;Neuromuscular re-education    PT Goals (  Current goals can be found in the Care Plan section)  Acute Rehab PT Goals Patient Stated Goal: decrease pain PT Goal Formulation: With patient Time For Goal Achievement: 07/28/21 Potential to Achieve Goals: Good    Frequency Min 4X/week     Co-evaluation PT/OT/SLP Co-Evaluation/Treatment: Yes Reason for Co-Treatment: Complexity of the patient's impairments (multi-system involvement);For patient/therapist safety PT goals addressed during session: Mobility/safety with mobility OT goals addressed during session: ADL's and self-care       AM-PAC PT "6 Clicks" Mobility  Outcome Measure Help needed turning from your back to your  side while in a flat bed without using bedrails?: A Lot Help needed moving from lying on your back to sitting on the side of a flat bed without using bedrails?: A Lot Help needed moving to and from a bed to a chair (including a wheelchair)?: Total Help needed standing up from a chair using your arms (e.g., wheelchair or bedside chair)?: Total Help needed to walk in hospital room?: Total Help needed climbing 3-5 steps with a railing? : Total 6 Click Score: 8    End of Session Equipment Utilized During Treatment: Gait belt Activity Tolerance: Patient limited by pain Patient left: in bed;with call bell/phone within reach;with bed alarm set;Other (comment) (R leg elevated) Nurse Communication: Mobility status PT Visit Diagnosis: Other abnormalities of gait and mobility (R26.89);Muscle weakness (generalized) (M62.81)    Time: 7014-1030 PT Time Calculation (min) (ACUTE ONLY): 33 min   Charges:   PT Evaluation $PT Eval Moderate Complexity: 1 Melina Schools, PT Acute Rehab Services Pager [phone] [facility] [phone]   Karlton Lemon 07/14/2021, 2:20 PM

## 2021-07-15 DIAGNOSIS — M549 Dorsalgia, unspecified: Secondary | ICD-10-CM | POA: Diagnosis present

## 2021-07-15 DIAGNOSIS — G8929 Other chronic pain: Secondary | ICD-10-CM | POA: Diagnosis present

## 2021-07-15 LAB — CBC
HCT: 30.5 % — ABNORMAL LOW (ref 36.0–46.0)
Hemoglobin: 8.9 g/dL — ABNORMAL LOW (ref 12.0–15.0)
MCH: 23.5 pg — ABNORMAL LOW (ref 26.0–34.0)
MCHC: 29.2 g/dL — ABNORMAL LOW (ref 30.0–36.0)
MCV: 80.5 fL (ref 80.0–100.0)
Platelets: 247 10*3/uL (ref 150–400)
RBC: 3.79 MIL/uL — ABNORMAL LOW (ref 3.87–5.11)
RDW: 18.5 % — ABNORMAL HIGH (ref 11.5–15.5)
WBC: 9.8 10*3/uL (ref 4.0–10.5)
nRBC: 0 % (ref 0.0–0.2)

## 2021-07-15 LAB — GLUCOSE, CAPILLARY
Glucose-Capillary: 133 mg/dL — ABNORMAL HIGH (ref 70–99)
Glucose-Capillary: 149 mg/dL — ABNORMAL HIGH (ref 70–99)
Glucose-Capillary: 178 mg/dL — ABNORMAL HIGH (ref 70–99)
Glucose-Capillary: 215 mg/dL — ABNORMAL HIGH (ref 70–99)
Glucose-Capillary: 150 mg/dL — ABNORMAL HIGH (ref 70–99)

## 2021-07-15 LAB — RENAL FUNCTION PANEL
Albumin: 3.1 g/dL — ABNORMAL LOW (ref 3.5–5.0)
Anion gap: 5 (ref 5–15)
BUN: 19 mg/dL (ref 8–23)
CO2: 26 mmol/L (ref 22–32)
Calcium: 9.3 mg/dL (ref 8.9–10.3)
Chloride: 104 mmol/L (ref 98–111)
Creatinine, Ser: 0.89 mg/dL (ref 0.44–1.00)
GFR, Estimated: >60 mL/min (ref >60)
Glucose, Bld: 210 mg/dL — ABNORMAL HIGH (ref 70–99)
Phosphorus: 2.2 mg/dL — ABNORMAL LOW (ref 2.5–4.6)
Potassium: 4.1 mmol/L (ref 3.5–5.1)
Sodium: 135 mmol/L (ref 135–145)

## 2021-07-15 LAB — FOLATE: Folate: 19.9 ng/mL (ref >5.9)

## 2021-07-15 LAB — VITAMIN B12: Vitamin B-12: 694 pg/mL (ref 180–914)

## 2021-07-15 LAB — IRON AND TIBC
Iron: 16 ug/dL — ABNORMAL LOW (ref 28–170)
Saturation Ratios: 5 % — ABNORMAL LOW (ref 10.4–31.8)
TIBC: 305 ug/dL (ref 250–450)
UIBC: 289 ug/dL

## 2021-07-15 LAB — RETICULOCYTES
Immature Retic Fract: 27.3 % — ABNORMAL HIGH (ref 2.3–15.9)
RBC.: 3.75 MIL/uL — ABNORMAL LOW (ref 3.87–5.11)
Retic Count, Absolute: 99 10*3/uL (ref 19.0–186.0)
Retic Ct Pct: 2.6 % (ref 0.4–3.1)

## 2021-07-15 LAB — MAGNESIUM: Magnesium: 1.9 mg/dL (ref 1.7–2.4)

## 2021-07-15 LAB — FERRITIN: Ferritin: 223 ng/mL (ref 11–307)

## 2021-07-15 MED ORDER — POLYETHYLENE GLYCOL 3350 17 G PO PACK: 17.0000 g | PACK | Freq: Two times a day (BID) | ORAL | Status: DC | PRN

## 2021-07-15 MED ORDER — POLYETHYLENE GLYCOL 3350 17 G PO PACK
17.0000 g | PACK | Freq: Two times a day (BID) | ORAL | Status: AC
Start: 2021-07-15 — End: 2021-07-15
  Administered 2021-07-15 (×2): 17 g via ORAL
  Filled 2021-07-15 (×2): qty 1

## 2021-07-15 MED ORDER — SENNOSIDES-DOCUSATE SODIUM 8.6-50 MG PO TABS
1.0000 | ORAL_TABLET | Freq: Two times a day (BID) | ORAL | Status: AC
Start: 2021-07-15 — End: 2021-07-15
  Administered 2021-07-15 (×2): 1 via ORAL
  Filled 2021-07-15 (×2): qty 1

## 2021-07-15 MED ORDER — DIPHENHYDRAMINE HCL 25 MG PO CAPS
25.0000 mg | ORAL_CAPSULE | Freq: Four times a day (QID) | ORAL | Status: DC | PRN
  Administered 2021-07-15 – 2021-07-16 (×3): 25 mg via ORAL
  Filled 2021-07-15 (×3): qty 1

## 2021-07-15 MED ORDER — SENNOSIDES-DOCUSATE SODIUM 8.6-50 MG PO TABS
1.0000 | ORAL_TABLET | Freq: Two times a day (BID) | ORAL | Status: DC | PRN
Start: 2021-07-16 — End: 2021-07-16

## 2021-07-15 NOTE — [place] FL2 (Signed)
?[place] MEDICAID FL2 LEVEL OF CARE SCREENING TOOL  ?  ? ?IDENTIFICATION  ?Patient Name: ?Bethany [name] Birthdate: [birth year] Sex: female Admission Date (Current Location): ?07/11/2021  ?[place] and [place] Number: ? [place] ?  Facility and Address:  ?[facility],  [address] [place], [place] ?     Provider Number: ?[id number]  ?Attending Physician Name and Address:  ?Mercy Riding, MD ? Relative Name and Phone Number:  ?  ?   ?Current Level of Care: ?Hospital Recommended Level of Care: ?[place] Prior Approval Number: ?  ? ?Date Approved/Denied: ?  PASRR Number: ?[id number] ? ?Discharge Plan: ?SNF ?  ? ?Current Diagnoses: ?Patient Active Problem List  ? Diagnosis Date Noted  ? Nondisplaced fracture of left patella 07/14/2021  ? Closed right ankle fracture, initial encounter 07/11/2021  ? Chronic rhinitis 06/22/2021  ? Tobacco use 04/30/2021  ? Multiple lung nodules 07/31/2020  ? Brain aneurysm 05/04/2020  ? Melena 12/30/2019  ? CVA (cerebral vascular accident) ([place]) 11/19/2019  ? Acute on chronic diastolic CHF (congestive heart failure) ([place])   ? Acute respiratory failure with hypoxia ([place]) 03/27/2019  ? Fever 03/27/2019  ? Shortness of breath 03/27/2019  ? Hypothyroidism 03/27/2019  ? Chronic GI bleeding 03/27/2019  ? Pleural effusion on right 03/27/2019  ? Acute renal failure superimposed on stage 3b chronic kidney disease ([place]) 03/27/2019  ? Essential hypertension 03/27/2019  ? GERD (gastroesophageal reflux disease) 03/27/2019  ? Acute respiratory failure ([place]) 03/27/2019  ? Breast mass, right 06/08/2017  ? Liver mass 06/08/2017  ? Chronic kidney disease (CKD), stage III (moderate) 03/13/2017  ? Chronic headaches 03/13/2017  ? Depression 03/13/2017  ? Diabetes mellitus type 2 with neurological manifestations ([place]) 03/13/2017  ? History of thrombosis 03/13/2017  ? Hyperlipemia, mixed 03/13/2017  ? Community acquired pneumonia 03/13/2017  ? COPD (chronic obstructive pulmonary  disease) ([place]) 03/13/2017  ? Pneumonia 03/13/2017  ? Iron deficiency anemia secondary to blood loss (chronic) 03/07/2017  ? GI AVM (gastrointestinal arteriovenous vascular malformation) 03/07/2017  ? History of colonic polyps 10/05/2016  ? ? ?Orientation RESPIRATION BLADDER Height & Weight   ?  ?Self, Time, Situation, Place ? Normal Continent Weight: 78 kg ?Height:  5\' 5"  (165.1 cm)  ?BEHAVIORAL SYMPTOMS/MOOD NEUROLOGICAL BOWEL NUTRITION STATUS  ?    Continent Diet (carb modified)  ?AMBULATORY STATUS COMMUNICATION OF NEEDS Skin   ?Limited Assist Verbally Normal, Surgical wounds (Right ankle surgical incision) ?  ?  ?  ?    ?     ?     ? ? ?Personal Care Assistance Level of Assistance  ?Bathing, Dressing, Feeding Bathing Assistance: Limited assistance ?Feeding assistance: Independent ?Dressing Assistance: Limited assistance ?   ? ?Functional Limitations Info  ?Sight, Hearing, Speech Sight Info: Adequate ?Hearing Info: Adequate ?Speech Info: Adequate  ? ? ?SPECIAL CARE FACTORS FREQUENCY  ?PT (By licensed PT), OT (By licensed OT)   ?  ?PT Frequency: 5x/week ?OT Frequency: 5x/week ?  ?  ?  ?   ? ? ?Contractures Contractures Info: Not present  ? ? ?Additional Factors Info  ?Code Status, Allergies Code Status Info: Full ?Allergies Info: Contrast media; ASA/anti-coags contraindicated due to hx GI bleed ?  ?  ?  ?   ? ?Current Medications (07/15/2021):  This is the current hospital active medication list ?Current Facility-Administered Medications  ?Medication Dose Route Frequency Provider Last Rate Last Admin  ? acetaminophen (TYLENOL) tablet 650 mg  650 mg Oral Q6H PRN Marylu Lund  K, MD   650 mg at 07/11/21 1921  ? Or  ? acetaminophen (TYLENOL) suppository 650 mg  650 mg Rectal Q6H PRN Donne Hazel, MD      ? acetaminophen (TYLENOL) tablet 325-650 mg  325-650 mg Oral Q6H PRN Georgeanna Harrison, MD      ? amLODipine (NORVASC) tablet 10 mg  10 mg Oral Daily Donne Hazel, MD   10 mg at 07/15/21 0957  ? atorvastatin  (LIPITOR) tablet 40 mg  40 mg Oral Daily Donne Hazel, MD   40 mg at 07/15/21 1937  ? camphor-menthol (SARNA) lotion   Topical PRN Donne Hazel, MD      ? Chlorhexidine Gluconate Cloth 2 % PADS 6 each  6 each Topical Daily Donne Hazel, MD   6 each at 07/15/21 [phone]  ? diphenhydrAMINE (BENADRYL) capsule 25 mg  25 mg Oral Q6H PRN Eudelia Bunch, RPH      ? docusate sodium (COLACE) capsule 100 mg  100 mg Oral BID Georgeanna Harrison, MD   100 mg at 07/15/21 0957  ? ezetimibe (ZETIA) tablet 10 mg  10 mg Oral Daily Donne Hazel, MD   10 mg at 07/15/21 0973  ? famotidine (PEPCID) tablet 20 mg  20 mg Oral BID Donne Hazel, MD   20 mg at 07/15/21 5329  ? feeding supplement (ENSURE SURGERY) liquid 237 mL  237 mL Oral BID BM Gonfa, Taye T, MD   237 mL at 07/14/21 1704  ? gabapentin (NEURONTIN) capsule 200 mg  200 mg Oral QID Donne Hazel, MD   200 mg at 07/15/21 9242  ? heparin lock flush 100 unit/mL  500 Units Intracatheter Prior to discharge Donne Hazel, MD      ? hydrALAZINE (APRESOLINE) tablet 25 mg  25 mg Oral Q6H PRN Mercy Riding, MD      ? HYDROcodone-acetaminophen (NORCO) 7.5-325 MG per tablet 1-2 tablet  1-2 tablet Oral Q4H PRN Georgeanna Harrison, MD   2 tablet at 07/15/21 6834  ? HYDROcodone-acetaminophen (NORCO/VICODIN) 5-325 MG per tablet 1 tablet  1 tablet Oral Q4H PRN [place], Amber, PA-C      ? HYDROcodone-acetaminophen (NORCO/VICODIN) 5-325 MG per tablet 1-2 tablet  1-2 tablet Oral Q4H PRN Georgeanna Harrison, MD   2 tablet at 07/15/21 1025  ? HYDROmorphone (DILAUDID) injection 1 mg  1 mg Intravenous Q2H PRN Donne Hazel, MD   1 mg at 07/15/21 0353  ? insulin aspart (novoLOG) injection 0-15 Units  0-15 Units Subcutaneous TID WC Mercy Riding, MD   3 Units at 07/15/21 1302  ? insulin glargine-yfgn (SEMGLEE) injection 10 Units  10 Units Subcutaneous BID Mercy Riding, MD   10 Units at 07/15/21 0956  ? ipratropium-albuterol (DUONEB) 0.5-2.5 (3) MG/3ML nebulizer solution 3 mL  3 mL Nebulization  Q6H PRN Donne Hazel, MD      ? levothyroxine (SYNTHROID) tablet 75 mcg  75 mcg Oral QAC breakfast Donne Hazel, MD   75 mcg at 07/15/21 1962  ? losartan (COZAAR) tablet 25 mg  25 mg Oral Daily Donne Hazel, MD   25 mg at 07/15/21 0957  ? methocarbamol (ROBAXIN) 500 mg in dextrose 5 % 50 mL IVPB  500 mg Intravenous Q6H PRN Donne Hazel, MD 100 mL/hr at 07/15/21 0955 500 mg at 07/15/21 0955  ? metoprolol tartrate (LOPRESSOR) tablet 50 mg  50 mg Oral BID Donne Hazel, MD   50 mg  at 07/15/21 0957  ? morphine (PF) 2 MG/ML injection 0.5-1 mg  0.5-1 mg Intravenous Q2H PRN Georgeanna Harrison, MD   1 mg at 07/14/21 1703  ? mupirocin ointment (BACTROBAN) 2 % 1 application  1 application Nasal BID Donne Hazel, MD   1 application at 83/41/96 2216  ? neomycin-bacitracin-polymyxin (NEOSPORIN) ointment   Topical BID Donne Hazel, MD   Given at 07/14/21 1705  ? ondansetron (ZOFRAN) tablet 4 mg  4 mg Oral Q6H PRN Georgeanna Harrison, MD      ? Or  ? ondansetron (ZOFRAN) injection 4 mg  4 mg Intravenous Q6H PRN Georgeanna Harrison, MD      ? polyethylene glycol (MIRALAX / GLYCOLAX) packet 17 g  17 g Oral BID Wendee Beavers T, MD      ? Followed by  ? Derrill Memo ON 07/16/2021] polyethylene glycol (MIRALAX / GLYCOLAX) packet 17 g  17 g Oral BID PRN Mercy Riding, MD      ? senna-docusate (Senokot-S) tablet 1 tablet  1 tablet Oral BID Mercy Riding, MD   1 tablet at 07/15/21 1025  ? Followed by  ? [START ON 07/16/2021] senna-docusate (Senokot-S) tablet 1 tablet  1 tablet Oral BID PRN Wendee Beavers T, MD      ? sodium chloride flush (NS) 0.9 % injection 10-40 mL  10-40 mL Intracatheter Q12H Tania Ade, MD      ? sodium chloride flush (NS) 0.9 % injection 10-40 mL  10-40 mL Intracatheter PRN Tania Ade, MD      ? Tdap Durwin Reges) injection 0.5 mL  0.5 mL Intramuscular Once Wynona Dove A, DO      ? ? ? ?Discharge Medications: ?Please see discharge summary for a list of discharge medications. ? ?Relevant Imaging Results: ? ?Relevant  Lab Results: ? ? ?Additional Information ?covid-19 Pfizer x4; ss# [phone] ? ?Tawanna Cooler, RN ? ? ? ? ?

## 2021-07-15 NOTE — Care Management Important Message (Signed)
Important Message ? ?Patient Details IM Letter given to the Patient. ?Name: Bethany [name] ?MRN: [id number] ?Date of Birth: December 02, 1950 ? ? ?Medicare Important Message Given:  Yes ? ? ? ? ?Kerin Salen ?07/15/2021, 9:57 AM ?

## 2021-07-15 NOTE — Progress Notes (Signed)
Physical Therapy Treatment ?Patient Details ?Name: Bethany [name] ?MRN: [id number] ?DOB: 1951-05-11 ?Today's Date: 07/15/2021 ? ? ?History of Present Illness Patient is a 71 year old female who presented after a fall at home. patient was found to have nondisplaced fractures involving medial malleolus and distal fibula on the right as well as nondisplaced and noncomminuted transverse fracture across the inferior aspect of the patella on the left. Patient underwent L ankle ORIF on 07/13/21.  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  Pt with R breast lumpectomy 9/22, has port and RN reports was getting chemo ? ?  ?PT Comments  ? ? Pt is progressing toward acute PT goals this session with performance of transfers to [facility] and recliner chair while adhering to WB precautions. Recommend short term rehab stay at SNF prior to home as pt requires MIN-MOD A +2 for performance and safety with all transfers due to NWB on R LE and KI donned at all times on L LE. Pt will benefit from continued skilled PT to increase their independence and maximize safety with mobility.  ?   ?Recommendations for follow up therapy are one component of a multi-disciplinary discharge planning process, led by the attending physician.  Recommendations may be updated based on patient status, additional functional criteria and insurance authorization. ? ?Follow Up Recommendations ? Skilled nursing-short term rehab (<3 hours/day) (denied for acute inpateint rehab) ?  ?  ?Assistance Recommended at Discharge Frequent or constant Supervision/Assistance  ?Patient can return home with the following Two people to help with bathing/dressing/bathroom;Two people to help with walking and/or transfers;Help with stairs or ramp for entrance;Assistance with cooking/housework;Assist for transportation ?  ?Equipment Recommendations ? Wheelchair (measurements PT);Wheelchair cushion (measurements PT);BSC/3in1;Rolling walker (2 wheels) (with drop arms (or  defer to SNF if approved))  ?  ?Recommendations for Other Services   ? ? ?  ?Precautions / Restrictions Precautions ?Precautions: Fall ?Precaution Comments: LLE- KI at all times, WBAT ?Required Braces or Orthoses: Knee Immobilizer - Left;Splint/Cast ?Knee Immobilizer - Left: On at all times ?Splint/Cast: Splint on R ankle ?Restrictions ?Weight Bearing Restrictions: Yes ?RLE Weight Bearing: Non weight bearing ?LLE Weight Bearing: Weight bearing as tolerated  ?  ? ?Mobility ? Bed Mobility ?Overal bed mobility: Needs Assistance ?Bed Mobility: Supine to Sit ?  ?  ?Supine to sit: Min guard, HOB elevated ?  ?  ?General bed mobility comments: Increased time, but pt able to progress to EOB with use of bed rails and scoot hips toward EOB in prep for sit to stand transfers. Able to verbalize WB restrictions and precautions for each extremity prior to mobility. ?  ? ?Transfers ?Overall transfer level: Needs assistance ?Equipment used: Rolling walker (2 wheels) ?Transfers: Sit to/from Stand ?Sit to Stand: Min assist, Mod assist, +2 physical assistance, +2 safety/equipment, From elevated surface ?  ?  ?  ?  ?  ?General transfer comment: MIN A +2 . MOD A+2 to rise from [facility] with use of B UEs to assist with power up. Pt with concerns about standing and keeping L knee "straight" cues provided for sequenicng and standing from maximally elevated EOB. Pt able to perform hopping ~3 steps forward/ ~3 steps laterally to get to [facility] and then able to hop posteriorly ~4 times to recliner chair with MIN A+2, intermittent MOD A +2 due to instability ?  ? ?Ambulation/Gait ?  ?  ?  ?  ?  ?  ?  ?  ? ? ?Stairs ?  ?  ?  ?  ?  ? ? ?  Wheelchair Mobility ?  ? ?Modified Rankin (Stroke Patients Only) ?  ? ? ?  ?Balance Overall balance assessment: Needs assistance ?Sitting-balance support: Feet supported ?Sitting balance-Leahy Scale: Fair ?  ?  ?Standing balance support: Bilateral upper extremity supported, Reliant on assistive device for balance, During  functional activity ?Standing balance-Leahy Scale: Poor ?Standing balance comment: use of RW and external support +2 ?  ?  ?  ?  ?  ?  ?  ?  ?  ?  ?  ?  ? ?  ?Cognition Arousal/Alertness: Awake/alert ?Behavior During Therapy: [facility] for tasks assessed/performed ?Overall Cognitive Status: Within Functional Limits for tasks assessed ?  ?  ?  ?  ?  ?  ?  ?  ?  ?  ?  ?  ?  ?  ?  ?  ?  ?  ?  ? ?  ?Exercises   ? ?  ?General Comments   ?  ?  ? ?Pertinent Vitals/Pain Pain Assessment ?Pain Assessment: Faces ?Faces Pain Scale: Hurts even more ?Pain Location: R ankle ?Pain Descriptors / Indicators: Discomfort, Sore, Tender ?Pain Intervention(s): Limited activity within patient's tolerance, Monitored during session, Repositioned, Ice applied  ? ? ?Home Living   ?  ?  ?  ?  ?  ?  ?  ?  ?  ?   ?  ?Prior Function    ?  ?  ?   ? ?PT Goals (current goals can now be found in the care plan section) Acute Rehab PT Goals ?Patient Stated Goal: decrease pain ?PT Goal Formulation: With patient ?Time For Goal Achievement: 07/28/21 ?Potential to Achieve Goals: Good ?Progress towards PT goals: Progressing toward goals ? ?  ?Frequency ? ? ? Min 3X/week ? ? ? ?  ?PT Plan Discharge plan needs to be updated  ? ? ?Co-evaluation   ?  ?  ?  ?  ? ?  ?AM-PAC PT "6 Clicks" Mobility   ?Outcome Measure ? Help needed turning from your back to your side while in a flat bed without using bedrails?: A Lot ?Help needed moving from lying on your back to sitting on the side of a flat bed without using bedrails?: A Lot ?Help needed moving to and from a bed to a chair (including a wheelchair)?: Total ?Help needed standing up from a chair using your arms (e.g., wheelchair or bedside chair)?: Total ?Help needed to walk in hospital room?: Total ?Help needed climbing 3-5 steps with a railing? : Total ?6 Click Score: 8 ? ?  ?End of Session Equipment Utilized During Treatment: Gait belt ?Activity Tolerance: Patient tolerated treatment well ?Patient left: in chair;with  call bell/phone within reach (elevated recliner chair, R LE  elevated) ?Nurse Communication: Mobility status (+2 assistance required) ?PT Visit Diagnosis: Other abnormalities of gait and mobility (R26.89);Muscle weakness (generalized) (M62.81);Unsteadiness on feet (R26.81);Pain ?Pain - Right/Left: Right ?Pain - part of body: Ankle and joints of foot ?  ? ? ?Time: 9702-6378 ?PT Time Calculation (min) (ACUTE ONLY): 33 min ? ?Charges:  $Therapeutic Activity: 23-37 mins          ?          ?Festus Barren., PT, DPT  ?Acute Rehabilitation Services  ?Office [phone] ? ? ?07/15/2021, 1:30 PM ? ?

## 2021-07-15 NOTE — TOC Initial Note (Addendum)
Transition of Care (TOC) - Initial/Assessment Note  ? ? ?Patient Details  ?Name: Bethany [name] ?MRN: [id number] ?Date of Birth: 1950-05-28 ? ?Transition of Care (TOC) CM/SW Contact:    ?Tawanna Cooler, RN ?Phone Number: ?07/15/2021, 1:42 PM ? ?Clinical Narrative:                 ? ?Spoke with patient regarding need for rehab post discharge.  Patient agreeable to go to SNF, gave verbal permission to fax referral to local facilities.  Will await SNF bed offers and to start insurance auth after choice made. ? ?Addendum: Met with patient and gave bed offers.  Patient chose [facility].  Communicated with Lexine Baton at [facility] that they are patient choice and plan is tomorrow.  Insurance auth requested [id number]).   ? ?Expected Discharge Plan: [place] ?Barriers to Discharge: Continued Medical Work up ? ? ?Patient Goals and CMS Choice ?Patient states their goals for this hospitalization and ongoing recovery are:: return home ?  ?Choice offered to / list presented to : Patient ? ?Expected Discharge Plan and Services ?Expected Discharge Plan: [place] ?  ?Discharge Planning Services: CM Consult ?Post Acute Care Choice: [place] ?Living arrangements for the past 2 months: Apartment ?                ?  ?  ?  ?  ?  ?  ?  ?  ? ?Prior Living Arrangements/Services ?Living arrangements for the past 2 months: Apartment ?Lives with:: Self ?Patient language and need for interpreter reviewed:: Yes ?       ?Need for Family Participation in Patient Care: Yes (Comment) ?Care giver support system in place?: Yes (comment) ?  ?Criminal Activity/Legal Involvement Pertinent to Current Situation/Hospitalization: No - Comment as needed ? ?Activities of Daily Living ?Home Assistive Devices/Equipment: None ?ADL Screening (condition at time of admission) ?Patient's cognitive ability adequate to safely complete daily activities?: Yes ?Is the patient deaf or have difficulty hearing?: No ?Does the patient  have difficulty seeing, even when wearing glasses/contacts?: No ?Does the patient have difficulty concentrating, remembering, or making decisions?: No ?Patient able to express need for assistance with ADLs?: Yes ?Does the patient have difficulty dressing or bathing?: Yes ?Independently performs ADLs?: Yes (appropriate for developmental age) ?Does the patient have difficulty walking or climbing stairs?: No ?Weakness of Legs: None ?Weakness of Arms/Hands: None ? ?Permission Sought/Granted ?  ?  ?   ?   ?   ?   ? ?Emotional Assessment ?Appearance:: Appears stated age ?Attitude/Demeanor/Rapport: Engaged ?Affect (typically observed): Accepting ?Orientation: : Oriented to Self, Oriented to Place, Oriented to  Time, Oriented to Situation ?Alcohol / Substance Use: Never Used ?Psych Involvement: No (comment) ? ?Admission diagnosis:  Closed nondisplaced longitudinal fracture of left patella, initial encounter [S82.025A] ?Closed right ankle fracture, initial encounter [S82.891A] ?Nondisplaced fracture of medial malleolus of right tibia, initial encounter for closed fracture [S82.54XA] ?Patient Active Problem List  ? Diagnosis Date Noted  ? Nondisplaced fracture of left patella 07/14/2021  ? Closed right ankle fracture, initial encounter 07/11/2021  ? Chronic rhinitis 06/22/2021  ? Tobacco use 04/30/2021  ? Multiple lung nodules 07/31/2020  ? Brain aneurysm 05/04/2020  ? Melena 12/30/2019  ? CVA (cerebral vascular accident) ([place]) 11/19/2019  ? Acute on chronic diastolic CHF (congestive heart failure) ([place])   ? Acute respiratory failure with hypoxia ([place]) 03/27/2019  ? Fever 03/27/2019  ? Shortness of breath 03/27/2019  ? Hypothyroidism 03/27/2019  ?  Chronic GI bleeding 03/27/2019  ? Pleural effusion on right 03/27/2019  ? Acute renal failure superimposed on stage 3b chronic kidney disease ([place]) 03/27/2019  ? Essential hypertension 03/27/2019  ? GERD (gastroesophageal reflux disease) 03/27/2019  ? Acute respiratory failure ([place])  03/27/2019  ? Breast mass, right 06/08/2017  ? Liver mass 06/08/2017  ? Chronic kidney disease (CKD), stage III (moderate) 03/13/2017  ? Chronic headaches 03/13/2017  ? Depression 03/13/2017  ? Diabetes mellitus type 2 with neurological manifestations ([place]) 03/13/2017  ? History of thrombosis 03/13/2017  ? Hyperlipemia, mixed 03/13/2017  ? Community acquired pneumonia 03/13/2017  ? COPD (chronic obstructive pulmonary disease) ([place]) 03/13/2017  ? Pneumonia 03/13/2017  ? Iron deficiency anemia secondary to blood loss (chronic) 03/07/2017  ? GI AVM (gastrointestinal arteriovenous vascular malformation) 03/07/2017  ? History of colonic polyps 10/05/2016  ? ?PCP:  Arthur Holms, NP ?Pharmacy:   ?[place], [place] - [address] ?[address] ?[place] [address] ?Phone: [phone] Fax: [phone] ? ?[facility] - [place], [place] - [address] ?[address] ?[place] [address] ?Phone: [phone] Fax: [phone] ? ? ? ? ?Social Determinants of Health (SDOH) Interventions ?  ? ?Readmission Risk Interventions ?No flowsheet data found. ? ? ?

## 2021-07-15 NOTE — Progress Notes (Signed)
PROGRESS NOTE  Bethany [name] OBS:[id number] DOB: 16-Jul-1950   PCP: Arthur Holms, NP  Patient is from: Home  DOA: 07/11/2021 LOS: 4  Chief complaints:  No chief complaint on file.    Brief Narrative / Interim history: 71 year old F with PMH of DM-2, HTN, HLD, arthritis, COPD, tobacco use disorder and IDA presenting with left knee pain and right ankle pain after accidental fall walking down the stairs, and admitted for right ankle (bimalleolar) fracture and left nondisplaced patellar fracture. Patient underwent ORIF for right ankle fracture by Dr. Mable Fill on 07/13/2021.  She is NWB on RLE and WBAT with knee immobilizer on LLE.  Therapy recommended CIR but didn't meet criteria.  Waiting on SNF bed.   Subjective: Seen and examined earlier this morning.  No major events overnight of this morning.  Complaining severe back pain from not getting her medication as she is supposed to.  She says she waited about 9 hours to get her pain medication yesterday.  Her back pain is chronic but gotten worse from lying in bed.  She rates her pain 8/10.  Denies radiculopathy or bowel or bladder habit change.  Right ankle pain about 5/10.  Objective: Vitals:   07/14/21 1429 07/14/21 2134 07/15/21 0412 07/15/21 1534  BP: 140/62 139/74 (!) 165/70 (!) 157/58  Pulse: (!) 58 68 66 68  Resp: 18  18 18   Temp: 98.5 F (36.9 C) 98.4 F (36.9 C) 98.9 F (37.2 C) 98.4 F (36.9 C)  TempSrc: Oral Oral Oral Oral  SpO2: 96% 99% 96% 97%  Weight:      Height:        Examination:  GENERAL: No apparent distress.  Nontoxic. HEENT: MMM.  Vision and hearing grossly intact.  NECK: Supple.  No apparent JVD.  RESP:  No IWOB.  Fair aeration bilaterally. CVS:  RRR. Heart sounds normal.  ABD/GI/GU: BS+. Abd soft, NTND.  MSK/EXT:  Moves extremities.  Bulky dressing over RLE.  Knee immobilizer over left leg. SKIN: no apparent skin lesion or wound NEURO: Awake and alert. Oriented appropriately.  No apparent focal neuro  deficit. PSYCH: Calm. Normal affect.   Procedures:  2/28-ORIF of right ankle fracture  Microbiology summarized: COVID-19 and influenza PCR nonreactive.  Assessment and Plan: * Closed right ankle fracture, initial encounter Imaging revealed bimalleolar fracture -S/p ORIF by Dr. Mable Fill on 2/28 -NWB on RLE.  -Pain control and VTE prophylaxis per surgery -Bowel regimen to avoid constipation -PT/OT-recommended CIR.  Chronic back pain Complains of worsening chronic back pain from lying in bed.  Complains about not getting him medication as ordered.  She states she waited about 9 hours to get her pain meds yesterday. -Discussed with patient's RN for the day to ensure her pain is well addressed.  Nondisplaced fracture of left patella -Pain control and WBAT with knee immobilizer per orthopedic surgery  Tobacco use Reports smoking about 10 cigarettes a day for about 45 years -Encourage cessation. -Nicotine patch if needed  Essential hypertension SBP elevated partly due to pain -Continue amlodipine, metoprolol and losartan. -P.o. hydralazine as needed  Hypothyroidism Stable.  Continue home Synthroid  COPD (chronic obstructive pulmonary disease) (HCC) Stable. -DuoNeb as needed  Hyperlipemia, mixed - Continue home Lipitor  Diabetes mellitus type 2 with neurological manifestations (HCC) A1c 5.2%. Recent Labs  Lab 07/14/21 1155 07/14/21 1725 07/14/21 2143 07/15/21 0749 07/15/21 1209  GLUCAP 93 134* 133* 149* 178*  -Continue SSI-moderate -Continue basal insulin at 10 units twice daily -Further adjustment as appropriate  Chronic kidney disease (CKD), stage III (moderate) Recent Labs    05/14/21 1208 05/26/21 1312 06/23/21 1157 07/02/21 1252 07/09/21 1235 07/11/21 1900 07/12/21 0503 07/13/21 0937 07/14/21 0645 07/15/21 1003  BUN 25* 20 25* 19 27* 18 18 19 22 19   CREATININE 1.82* 1.23* 1.40* 1.11* 1.81* 1.05* 1.11* 1.07* 1.05* 0.89  Stable.  Continue  monitoring   Iron deficiency anemia secondary to blood loss (chronic) Recent Labs    05/26/21 1312 06/22/21 1248 06/23/21 1157 07/02/21 1252 07/09/21 1235 07/11/21 1900 07/12/21 0503 07/13/21 0937 07/14/21 0645 07/15/21 0356  HGB 9.9* 11.3* 10.2* 10.0* 9.4* 9.8* 9.2* 10.1* 9.1* 8.9*  H&H seems to be at baseline.  Followed by hematology and gets iron infusions.  Anemia panel consistent with iron deficiency -IV iron in the morning. -Monitor H&H          Body mass index is 28.62 kg/m. Nutrition Problem: Increased nutrient needs Etiology: post-op healing (ankle fracture) Signs/Symptoms: estimated needs Interventions: Ensure Surgery   DVT prophylaxis:  SCDs Start: 07/13/21 1601 Place and maintain sequential compression device Start: 07/11/21 1808  Code Status: Full code Family Communication: Patient and/or RN. Available if any question.  Level of care: Telemetry Status is: Inpatient Remains inpatient appropriate because: Pain management and lack of safe disposition/SNF     Final disposition: SNF      Consultants:  Orthopedic surgery   Sch Meds:  Scheduled Meds:  amLODipine  10 mg Oral Daily   atorvastatin  40 mg Oral Daily   Chlorhexidine Gluconate Cloth  6 each Topical Daily   docusate sodium  100 mg Oral BID   ezetimibe  10 mg Oral Daily   famotidine  20 mg Oral BID   feeding supplement  237 mL Oral BID BM   gabapentin  200 mg Oral QID   insulin aspart  0-15 Units Subcutaneous TID WC   insulin glargine-yfgn  10 Units Subcutaneous BID   levothyroxine  75 mcg Oral QAC breakfast   losartan  25 mg Oral Daily   metoprolol tartrate  50 mg Oral BID   mupirocin ointment  1 application Nasal BID   neomycin-bacitracin-polymyxin   Topical BID   polyethylene glycol  17 g Oral BID   senna-docusate  1 tablet Oral BID   sodium chloride flush  10-40 mL Intracatheter Q12H   Tdap  0.5 mL Intramuscular Once   Continuous Infusions:  methocarbamol (ROBAXIN) IV  500 mg (07/15/21 0955)   PRN Meds:.acetaminophen **OR** acetaminophen, acetaminophen, camphor-menthol, diphenhydrAMINE, heparin lock flush, hydrALAZINE, HYDROcodone-acetaminophen, HYDROcodone-acetaminophen, HYDROcodone-acetaminophen, HYDROmorphone (DILAUDID) injection, ipratropium-albuterol, methocarbamol (ROBAXIN) IV, morphine injection, ondansetron **OR** ondansetron (ZOFRAN) IV, polyethylene glycol **FOLLOWED BY** [START ON 07/16/2021] polyethylene glycol, senna-docusate **FOLLOWED BY** [START ON 07/16/2021] senna-docusate, sodium chloride flush  Antimicrobials: Anti-infectives (From admission, onward)    Start     Dose/Rate Route Frequency Ordered Stop   07/13/21 1800  ceFAZolin (ANCEF) IVPB 1 g/50 mL premix        1 g 100 mL/hr over 30 Minutes Intravenous Every 6 hours 07/13/21 1600 07/14/21 0705   07/13/21 1130  ceFAZolin (ANCEF) IVPB 2g/100 mL premix        2 g 200 mL/hr over 30 Minutes Intravenous On call to O.R. 07/13/21 1033 07/13/21 1252        I have personally reviewed the following labs and images: CBC: Recent Labs  Lab 07/09/21 1235 07/11/21 1900 07/12/21 0503 07/13/21 0937 07/14/21 0645 07/15/21 0356  WBC 8.2 10.5 8.8 8.7 13.0* 9.8  NEUTROABS 5.6  --   --   --   --   --  HGB 9.4* 9.8* 9.2* 10.1* 9.1* 8.9*  HCT 30.9* 32.6* 30.4* 33.0* 31.1* 30.5*  MCV 78.4* 78.7* 79.4* 79.3* 81.2 80.5  PLT 236 227 222 241 239 247   BMP &GFR Recent Labs  Lab 07/11/21 1900 07/12/21 0503 07/13/21 0937 07/14/21 0645 07/15/21 0356 07/15/21 1003  NA 136 136 136 135  --  135  K 4.4 4.3 4.6 4.4  --  4.1  CL 104 105 105 106  --  104  CO2 28 27 26 25   --  26  GLUCOSE 108* 122* 137* 304*  --  210*  BUN 18 18 19 22   --  19  CREATININE 1.05* 1.11* 1.07* 1.05*  --  0.89  CALCIUM 10.1 9.7 9.9 9.5  --  9.3  MG  --   --   --   --  1.9  --   PHOS  --   --   --   --   --  2.2*   Estimated Creatinine Clearance: 59.9 mL/min (by C-G formula based on SCr of 0.89 mg/dL). Liver &  Pancreas: Recent Labs  Lab 07/09/21 1235 07/12/21 0503 07/13/21 0937 07/14/21 0645 07/15/21 1003  AST 23 25 22  45*  --   ALT 28 39 32 60*  --   ALKPHOS 173* 168* 172* 272*  --   BILITOT 0.3 0.3 0.3 0.2*  --   PROT 6.6 6.7 6.9 6.6  --   ALBUMIN 3.9 3.4* 3.4* 3.3* 3.1*   No results for input(s): LIPASE, AMYLASE in the last 168 hours. No results for input(s): AMMONIA in the last 168 hours. Diabetic: No results for input(s): HGBA1C in the last 72 hours.  Recent Labs  Lab 07/14/21 1155 07/14/21 1725 07/14/21 2143 07/15/21 0749 07/15/21 1209  GLUCAP 93 134* 133* 149* 178*   Cardiac Enzymes: No results for input(s): CKTOTAL, CKMB, CKMBINDEX, TROPONINI in the last 168 hours. No results for input(s): PROBNP in the last 8760 hours. Coagulation Profile: No results for input(s): INR, PROTIME in the last 168 hours. Thyroid Function Tests: No results for input(s): TSH, T4TOTAL, FREET4, T3FREE, THYROIDAB in the last 72 hours. Lipid Profile: No results for input(s): CHOL, HDL, LDLCALC, TRIG, CHOLHDL, LDLDIRECT in the last 72 hours. Anemia Panel: Recent Labs    07/15/21 0356  VITAMINB12 694  FOLATE 19.9  FERRITIN 223  TIBC 305  IRON 16*  RETICCTPCT 2.6   Urine analysis:    Component Value Date/Time   COLORURINE STRAW (A) 05/05/2020 0828   APPEARANCEUR CLEAR 05/05/2020 0828   LABSPEC 1.017 05/05/2020 0828   PHURINE 6.0 05/05/2020 0828   GLUCOSEU NEGATIVE 05/05/2020 0828   HGBUR NEGATIVE 05/05/2020 0828   BILIRUBINUR NEGATIVE 05/05/2020 0828   KETONESUR NEGATIVE 05/05/2020 0828   PROTEINUR NEGATIVE 05/05/2020 0828   NITRITE NEGATIVE 05/05/2020 0828   LEUKOCYTESUR NEGATIVE 05/05/2020 0828   Sepsis Labs: Invalid input(s): PROCALCITONIN, Media  Microbiology: Recent Results (from the past 240 hour(s))  Resp Panel by RT-PCR (Flu A&B, Covid) Nasopharyngeal Swab     Status: None   Collection Time: 07/11/21 12:40 PM   Specimen: Nasopharyngeal Swab; Nasopharyngeal(NP)  swabs in vial transport medium  Result Value Ref Range Status   SARS Coronavirus 2 by RT PCR NEGATIVE NEGATIVE Final    Comment: (NOTE) SARS-CoV-2 target nucleic acids are NOT DETECTED.  The SARS-CoV-2 RNA is generally detectable in upper respiratory specimens during the acute phase of infection. The lowest concentration of SARS-CoV-2 viral copies this assay can detect is 138 copies/mL. A  negative result does not preclude SARS-Cov-2 infection and should not be used as the sole basis for treatment or other patient management decisions. A negative result may occur with  improper specimen collection/handling, submission of specimen other than nasopharyngeal swab, presence of viral mutation(s) within the areas targeted by this assay, and inadequate number of viral copies(<138 copies/mL). A negative result must be combined with clinical observations, patient history, and epidemiological information. The expected result is Negative.  Fact Sheet for Patients:  EntrepreneurPulse.com.au  Fact Sheet for Healthcare Providers:  IncredibleEmployment.be  This test is no t yet approved or cleared by the [place] FDA and  has been authorized for detection and/or diagnosis of SARS-CoV-2 by FDA under an Emergency Use Authorization (EUA). This EUA will remain  in effect (meaning this test can be used) for the duration of the COVID-19 declaration under Section 564(b)(1) of the Act, 21 U.S.C.section 360bbb-3(b)(1), unless the authorization is terminated  or revoked sooner.       Influenza A by PCR NEGATIVE NEGATIVE Final   Influenza B by PCR NEGATIVE NEGATIVE Final    Comment: (NOTE) The Xpert Xpress SARS-CoV-2/FLU/RSV plus assay is intended as an aid in the diagnosis of influenza from Nasopharyngeal swab specimens and should not be used as a sole basis for treatment. Nasal washings and aspirates are unacceptable for Xpert Xpress  SARS-CoV-2/FLU/RSV testing.  Fact Sheet for Patients: EntrepreneurPulse.com.au  Fact Sheet for Healthcare Providers: IncredibleEmployment.be  This test is not yet approved or cleared by the [place] FDA and has been authorized for detection and/or diagnosis of SARS-CoV-2 by FDA under an Emergency Use Authorization (EUA). This EUA will remain in effect (meaning this test can be used) for the duration of the COVID-19 declaration under Section 564(b)(1) of the Act, 21 U.S.C. section 360bbb-3(b)(1), unless the authorization is terminated or revoked.  Performed at [facility], [address] [place], [place] [address]   Surgical PCR screen     Status: None   Collection Time: 07/11/21 11:00 PM   Specimen: Nasal Mucosa; Nasal Swab  Result Value Ref Range Status   MRSA, PCR NEGATIVE NEGATIVE Final   Staphylococcus aureus NEGATIVE NEGATIVE Final    Comment: (NOTE) The Xpert SA Assay (FDA approved for NASAL specimens in patients 88 years of age and older), is one component of a comprehensive surveillance program. It is not intended to diagnose infection nor to guide or monitor treatment. Performed at [facility], [place] [address] [place], [place] [address]     Radiology Studies: No results found.    Tiffaney Heimann T. Nerstrand  If 7PM-7AM, please contact night-coverage [email or web address] 07/15/2021, 5:00 PM

## 2021-07-15 NOTE — Assessment & Plan Note (Addendum)
-  Continue home Xtampza, Percocet and gabapentin. ?-Bowel regimen ?

## 2021-07-16 ENCOUNTER — Encounter (HOSPITAL_COMMUNITY): Payer: Self-pay | Admitting: Orthopedic Surgery

## 2021-07-16 DIAGNOSIS — N1831 Chronic kidney disease, stage 3a: Secondary | ICD-10-CM

## 2021-07-16 DIAGNOSIS — G8929 Other chronic pain: Secondary | ICD-10-CM

## 2021-07-16 DIAGNOSIS — D500 Iron deficiency anemia secondary to blood loss (chronic): Secondary | ICD-10-CM

## 2021-07-16 DIAGNOSIS — M545 Low back pain, unspecified: Secondary | ICD-10-CM

## 2021-07-16 LAB — RENAL FUNCTION PANEL
Albumin: 3.1 g/dL — ABNORMAL LOW (ref 3.5–5.0)
Anion gap: 5 (ref 5–15)
BUN: 25 mg/dL — ABNORMAL HIGH (ref 8–23)
CO2: 25 mmol/L (ref 22–32)
Calcium: 9.4 mg/dL (ref 8.9–10.3)
Chloride: 104 mmol/L (ref 98–111)
Creatinine, Ser: 0.94 mg/dL (ref 0.44–1.00)
GFR, Estimated: >60 mL/min (ref >60)
Glucose, Bld: 189 mg/dL — ABNORMAL HIGH (ref 70–99)
Phosphorus: 2.8 mg/dL (ref 2.5–4.6)
Potassium: 4.2 mmol/L (ref 3.5–5.1)
Sodium: 134 mmol/L — ABNORMAL LOW (ref 135–145)

## 2021-07-16 LAB — CBC
HCT: 28.8 % — ABNORMAL LOW (ref 36.0–46.0)
Hemoglobin: 8.3 g/dL — ABNORMAL LOW (ref 12.0–15.0)
MCH: 23.4 pg — ABNORMAL LOW (ref 26.0–34.0)
MCHC: 28.8 g/dL — ABNORMAL LOW (ref 30.0–36.0)
MCV: 81.4 fL (ref 80.0–100.0)
Platelets: 231 10*3/uL (ref 150–400)
RBC: 3.54 MIL/uL — ABNORMAL LOW (ref 3.87–5.11)
RDW: 18.2 % — ABNORMAL HIGH (ref 11.5–15.5)
WBC: 7.3 10*3/uL (ref 4.0–10.5)
nRBC: 0 % (ref 0.0–0.2)

## 2021-07-16 LAB — GLUCOSE, CAPILLARY
Glucose-Capillary: 162 mg/dL — ABNORMAL HIGH (ref 70–99)
Glucose-Capillary: 144 mg/dL — ABNORMAL HIGH (ref 70–99)

## 2021-07-16 LAB — RESP PANEL BY RT-PCR (FLU A&B, COVID) ARPGX2
Influenza A by PCR: NEGATIVE
Influenza B by PCR: NEGATIVE
SARS Coronavirus 2 by RT PCR: NEGATIVE

## 2021-07-16 LAB — MAGNESIUM: Magnesium: 2 mg/dL (ref 1.7–2.4)

## 2021-07-16 MED ORDER — SENNOSIDES-DOCUSATE SODIUM 8.6-50 MG PO TABS: 1.0000 | ORAL_TABLET | Freq: Two times a day (BID) | ORAL | 0 refills | Status: AC | PRN

## 2021-07-16 MED ORDER — OXYCODONE-ACETAMINOPHEN 10-325 MG PO TABS: 1.0000 | ORAL_TABLET | Freq: Four times a day (QID) | ORAL | 0 refills | Status: AC | PRN

## 2021-07-16 MED ORDER — XTAMPZA ER 27 MG PO C12A: 27.0000 mg | EXTENDED_RELEASE_CAPSULE | Freq: Two times a day (BID) | ORAL | 0 refills | Status: AC

## 2021-07-16 MED ORDER — POLYETHYLENE GLYCOL 3350 17 GM/SCOOP PO POWD: 17.0000 g | Freq: Two times a day (BID) | ORAL | 0 refills | Status: AC | PRN

## 2021-07-16 MED ORDER — SODIUM CHLORIDE 0.9 % IV SOLN
250.0000 mg | Freq: Every day | INTRAVENOUS | Status: DC
  Administered 2021-07-16: 250 mg via INTRAVENOUS
  Filled 2021-07-16: qty 20

## 2021-07-16 NOTE — Discharge Summary (Signed)
Physician Discharge Summary   Patient: Bethany [name] MRN: [id number] DOB: 05-Nov-1950  Admit date:     07/11/2021  Discharge date: 07/16/21  Discharge Physician: Mercy Riding   PCP: Arthur Holms, NP   Recommendations at discharge:   Recheck CBC and BMP in 1 week Outpatient follow-up with orthopedic surgery in 2 weeks Outpatient follow-up with hematology as previously planned or in 2-3 weeks  Discharge Diagnoses: Principal Problem:   Closed right ankle fracture, initial encounter Active Problems:   Nondisplaced fracture of left patella   Iron deficiency anemia secondary to blood loss (chronic)   Chronic kidney disease (CKD), stage III (moderate)   Diabetes mellitus type 2 with neurological manifestations (HCC)   Hyperlipemia, mixed   COPD (chronic obstructive pulmonary disease) ([place])   Hypothyroidism   Essential hypertension   Tobacco use   Chronic back pain  Resolved Problems:   * No resolved hospital problems. *   Hospital Course: 71 year old F with PMH of IDDM-2, HTN, HLD, chronic back pain/arthritis on opiate, COPD, tobacco use disorder and IDA presenting with left knee pain and right ankle pain after accidental fall walking down the stairs, and admitted for right ankle (bimalleolar) fracture and left nondisplaced patellar fracture. Patient underwent ORIF for right ankle fracture by Dr. Mable Fill on 07/13/2021.  She is NWB on RLE and WBAT with knee immobilizer on LLE.  Therapy recommended CIR but didn't meet criteria.  Discharged to SNF.   Outpatient follow-up with orthopedic surgery in 2 weeks  Assessment and Plan: * Closed right ankle fracture, initial encounter Imaging revealed bimalleolar fracture -S/p ORIF by Dr. Mable Fill on 2/28 -NWB on RLE.  WBAT with knee immobilizer on LLE -Patient is already on Xtampza and Percocet for chronic back pain. -Bowel regimen to avoid constipation -Continue PT/OT at SNF -Outpatient follow-up with orthopedic surgery in 2  weeks  Nondisplaced fracture of left patella -Pain control and WBAT with knee immobilizer per orthopedic surgery  Chronic back pain -Continue home Xtampza, Percocet and gabapentin. -Bowel regimen  Tobacco use Reports smoking about 10 cigarettes a day for about 45 years -Encourage cessation. -Nicotine patch if needed  Essential hypertension BP within acceptable range. -Continue amlodipine, metoprolol and losartan.  Hypothyroidism Stable.  Continue home Synthroid  COPD (chronic obstructive pulmonary disease) (HCC) Stable. -Continue home Respimat  Hyperlipemia, mixed - Continue home Lipitor  Diabetes mellitus type 2 with neurological manifestations (HCC) A1c 5.2%. Recent Labs  Lab 07/14/21 1155 07/14/21 1725 07/14/21 2143 07/15/21 0749 07/15/21 1209  GLUCAP 93 134* 133* 149* 178*  -Continue home Tresiba -Diabetic diet   Chronic kidney disease (CKD), stage III (moderate) Recent Labs    05/26/21 1312 06/23/21 1157 07/02/21 1252 07/09/21 1235 07/11/21 1900 07/12/21 0503 07/13/21 0937 07/14/21 0645 07/15/21 1003 07/16/21 0548  BUN 20 25* 19 27* '18 18 19 22 19 ' 25*  CREATININE 1.23* 1.40* 1.11* 1.81* 1.05* 1.11* 1.07* 1.05* 0.89 0.94  Stable.  Continue monitoring -Recheck BMP in 1 week   Iron deficiency anemia secondary to blood loss (chronic) Recent Labs    06/22/21 1248 06/23/21 1157 07/02/21 1252 07/09/21 1235 07/11/21 1900 07/12/21 0503 07/13/21 0937 07/14/21 0645 07/15/21 0356 07/16/21 0548  HGB 11.3* 10.2* 10.0* 9.4* 9.8* 9.2* 10.1* 9.1* 8.9* 8.3*  H&H seems to be at baseline.  Followed by hematology and gets iron infusions.  Anemia panel consistent with iron deficiency.  Patient denies melena or hematochezia. -Received IV ferric gluconate 250 mg  -Outpatient follow-up with hematology -Recheck CBC in 1  week          Pain control - [facility] Controlled Substance Reporting System database was reviewed. and patient was instructed,  not to drive, operate heavy machinery, perform activities at heights, swimming or participation in water activities or provide baby-sitting services while on Pain, Sleep and Anxiety Medications; until their outpatient Physician has advised to do so again. Also recommended to not to take more than prescribed Pain, Sleep and Anxiety Medications.   Consultants: Orthopedic surgery Procedures performed: ORIF of right ankle Disposition: Skilled nursing facility Diet recommendation:  Discharge Diet Orders (From admission, onward)     Start     Ordered   07/16/21 0000  Diet - low sodium heart healthy        07/16/21 1102   07/16/21 0000  Diet Carb Modified        07/16/21 1102           Cardiac and Carb modified diet  DISCHARGE MEDICATION: Allergies as of 07/16/2021       Reactions   Contrast Media [iodinated Contrast Media]    Renal hypertension per physician   Other Other (See Comments)   No ASA/Anti-Coag due to GI bleed        Medication List     STOP taking these medications    pregabalin 25 MG capsule Commonly known as: LYRICA   senna 8.6 MG Tabs tablet Commonly known as: SENOKOT       TAKE these medications    allopurinol 100 MG tablet Commonly known as: ZYLOPRIM Take 100 mg by mouth at bedtime.   amLODipine 10 MG tablet Commonly known as: NORVASC Take 10 mg by mouth daily.   aspirin 81 MG EC tablet Take 1 tablet (81 mg total) by mouth daily. Swallow whole.   atorvastatin 40 MG tablet Commonly known as: LIPITOR Take 40 mg by mouth daily.   baclofen 10 MG tablet Commonly known as: LIORESAL Take 10 mg by mouth 3 (three) times daily as needed for muscle spasms.   ezetimibe 10 MG tablet Commonly known as: ZETIA Take 10 mg by mouth daily.   famotidine 20 MG tablet Commonly known as: PEPCID Take 20 mg by mouth 2 (two) times daily.   feeding supplement Liqd Take 237 mLs by mouth 2 (two) times daily between meals.   furosemide 40 MG tablet Commonly  known as: LASIX Take 40 mg by mouth daily as needed for fluid or edema.   gabapentin 100 MG capsule Commonly known as: NEURONTIN Take 2 capsules (200 mg total) by mouth 4 (four) times daily.   insulin lispro 100 UNIT/ML KwikPen Commonly known as: HUMALOG Inject 5 Units into the skin 3 (three) times daily as needed (blood sugar >200).   Ipratropium-Albuterol 20-100 MCG/ACT Aers respimat Commonly known as: COMBIVENT Inhale 1 puff into the lungs every 6 (six) hours as needed for wheezing or shortness of breath.   levothyroxine 75 MCG tablet Commonly known as: SYNTHROID Take 75 mcg by mouth daily.   lidocaine-prilocaine cream Commonly known as: EMLA Apply 1 application topically as needed. What changed:  when to take this reasons to take this   losartan 25 MG tablet Commonly known as: Cozaar Take 1 tablet (25 mg total) by mouth daily.   metoprolol tartrate 50 MG tablet Commonly known as: LOPRESSOR Take 50 mg by mouth 2 (two) times daily.   Narcan 4 MG/0.1ML Liqd nasal spray kit Generic drug: naloxone Place 1 spray into the nose as needed for opioid reversal.  oxyCODONE-acetaminophen 10-325 MG tablet Commonly known as: PERCOCET Take 1 tablet by mouth every 6 (six) hours as needed for up to 7 days (breakthrough pain).   pantoprazole 40 MG tablet Commonly known as: PROTONIX Take 40 mg by mouth every morning.   polyethylene glycol powder 17 GM/SCOOP powder Commonly known as: MiraLax Take 17 g by mouth 2 (two) times daily as needed for moderate constipation.   senna-docusate 8.6-50 MG tablet Commonly known as: Senokot-S Take 1 tablet by mouth 2 (two) times daily between meals as needed for mild constipation.   Toujeo SoloStar 300 UNIT/ML Solostar Pen Generic drug: insulin glargine (1 Unit Dial) Inject 20 Units into the skin at bedtime.   Trulicity 1.5 BJ/6.2GB Sopn Generic drug: Dulaglutide Inject 1.5 mg into the skin every Monday.   VISINE OP Place 1 drop into  both eyes 3 (three) times daily as needed (dry eyes).   Xtampza ER 27 MG C12a Generic drug: oxyCODONE ER Take 27 mg by mouth 2 (two) times daily for 7 days.        Follow-up Information     Georgeanna Harrison, MD. Schedule an appointment as soon as possible for a visit in 2 week(s).   Specialty: Orthopedic Surgery Why: Please make appointment to be seen within 2 weeks. Contact information: [address] [place] [address] [place]  [phone]                 Discharge Exam: Danley Danker Weights   07/11/21 1007  Weight: 78 kg   Vitals:   07/15/21 1534 07/15/21 2047 07/15/21 2148 07/16/21 0448  BP: (!) 157/58 (!) 157/61  (!) 142/55  Pulse: 68 (!) 58 64 (!) 53  Resp: '18 18  18  ' Temp: 98.4 F (36.9 C) 98.6 F (37 C)  98.6 F (37 C)  TempSrc: Oral Oral  Oral  SpO2: 97% 99%  96%  Weight:      Height:       GENERAL: No apparent distress.  Nontoxic. HEENT: MMM.  Vision and hearing grossly intact.  NECK: Supple.  No apparent JVD.  RESP:  No IWOB.  Fair aeration bilaterally. CVS:  RRR. Heart sounds normal.  ABD/GI/GU: BS+. Abd soft, NTND.  MSK/EXT:  Moves extremities.  Knee immobilizer over LLE.  Bulky dressing over RLE. SKIN: no apparent skin lesion or wound NEURO: Awake and alert. Oriented appropriately.  No apparent focal neuro deficit. PSYCH: Calm. Normal affect.   Condition at discharge: good  The results of significant diagnostics from this hospitalization (including imaging, microbiology, ancillary and laboratory) are listed below for reference.   Imaging Studies: DG Wrist Complete Left  Result Date: 07/11/2021 CLINICAL DATA:  Patient fell yesterday at home. Patient awoke this morning unable to bear weight on the right ankle. Right ankle pain. Left knee swelling. Hip and left wrist discomfort. EXAM: LEFT WRIST - COMPLETE 3+ VIEW COMPARISON:  None. FINDINGS: There is no evidence of fracture or dislocation. There is no evidence of arthropathy or other focal bone  abnormality. Soft tissues are unremarkable. IMPRESSION: Negative. Electronically Signed   By: Lajean Manes M.D.   On: 07/11/2021 11:43   DG Ankle 2 Views Right  Result Date: 07/13/2021 CLINICAL DATA:  ORIF right ankle fluoroscopy EXAM: RIGHT ANKLE - 2 VIEW COMPARISON:  Right ankle radiographs 07/11/2021 FINDINGS: Images were performed intraoperatively without the presence of a radiologist. Patient appears to be undergoing medial malleolar fixation with 2 screws and distal fibular fixation with plate and screws. Additional distal tibiofibular  syndesmotic fixation screw. Total fluoro images: 3 Total fluoroscopy time: 1 minute 12 seconds Please see intraoperative findings for further detail. IMPRESSION: Fluoroscopy for ORIF of the medial and lateral malleoli. Electronically Signed   By: Yvonne Kendall M.D.   On: 07/13/2021 14:49   DG Ankle Complete Right  Result Date: 07/11/2021 CLINICAL DATA:  Patient fell yesterday at home. Patient awoke this morning unable to bear weight on the right ankle. Right ankle pain. Left knee swelling. Hip and left wrist discomfort. EXAM: RIGHT ANKLE - COMPLETE 3+ VIEW COMPARISON:  None. FINDINGS: Bimalleolar fractures. There is a nondisplaced fracture across the base of the medial malleolus with a better defined transverse fracture across the distal fibula, just below the level of the ankle mortise. No other fractures.  Ankle joint normally spaced and aligned. Diffuse surrounding soft tissue swelling. IMPRESSION: 1. Nondisplaced fractures of the medial malleolus and distal fibula. Normally aligned ankle joint. Electronically Signed   By: Lajean Manes M.D.   On: 07/11/2021 11:40   DG Knee Complete 4 Views Left  Result Date: 07/11/2021 CLINICAL DATA:  Patient fell yesterday at home. Patient awoke this morning unable to bear weight on the right ankle. Right ankle pain. Left knee swelling. Hip and left wrist discomfort. EXAM: LEFT KNEE - COMPLETE 4+ VIEW COMPARISON:  None.  FINDINGS: Nondisplaced, non comminuted fracture along the inferior aspect of the patella. No other fractures.  No bone lesions. Knee joint normally spaced and aligned. No degenerative/arthropathic changes. Small joint effusion. Mild anterior soft tissue swelling. IMPRESSION: 1. Nondisplaced, non comminuted transverse fracture across the inferior aspect of the patella, associated with a small joint effusion. Electronically Signed   By: Lajean Manes M.D.   On: 07/11/2021 11:42   DG Knee Complete 4 Views Right  Result Date: 07/11/2021 CLINICAL DATA:  Fall yesterday.  Right knee pain. EXAM: RIGHT KNEE - COMPLETE 4+ VIEW COMPARISON:  None. FINDINGS: No evidence of fracture, dislocation, or joint effusion. No evidence of arthropathy or other focal bone abnormality. Soft tissues are unremarkable. IMPRESSION: Negative. Electronically Signed   By: Lajean Manes M.D.   On: 07/11/2021 13:14   DG C-Arm 1-60 Min-No Report  Result Date: 07/13/2021 Fluoroscopy was utilized by the requesting physician.  No radiographic interpretation.   DG C-Arm 1-60 Min-No Report  Result Date: 07/13/2021 Fluoroscopy was utilized by the requesting physician.  No radiographic interpretation.   DG Hips Bilat W or Wo Pelvis 2 Views  Result Date: 07/11/2021 CLINICAL DATA:  Patient fell yesterday at home. Patient awoke this morning unable to bear weight on the right ankle. Right ankle pain. Left knee swelling. Hip and left wrist discomfort. EXAM: DG HIP (WITH OR WITHOUT PELVIS) 2V BILAT COMPARISON:  None. FINDINGS: There is no evidence of hip fracture or dislocation. There is no evidence of arthropathy or other focal bone abnormality. IMPRESSION: Negative. Electronically Signed   By: Lajean Manes M.D.   On: 07/11/2021 11:41    Microbiology: Results for orders placed or performed during the hospital encounter of 07/11/21  Resp Panel by RT-PCR (Flu A&B, Covid) Nasopharyngeal Swab     Status: None   Collection Time: 07/11/21 12:40 PM    Specimen: Nasopharyngeal Swab; Nasopharyngeal(NP) swabs in vial transport medium  Result Value Ref Range Status   SARS Coronavirus 2 by RT PCR NEGATIVE NEGATIVE Final    Comment: (NOTE) SARS-CoV-2 target nucleic acids are NOT DETECTED.  The SARS-CoV-2 RNA is generally detectable in upper respiratory specimens during the acute phase  of infection. The lowest concentration of SARS-CoV-2 viral copies this assay can detect is 138 copies/mL. A negative result does not preclude SARS-Cov-2 infection and should not be used as the sole basis for treatment or other patient management decisions. A negative result may occur with  improper specimen collection/handling, submission of specimen other than nasopharyngeal swab, presence of viral mutation(s) within the areas targeted by this assay, and inadequate number of viral copies(<138 copies/mL). A negative result must be combined with clinical observations, patient history, and epidemiological information. The expected result is Negative.  Fact Sheet for Patients:  EntrepreneurPulse.com.au  Fact Sheet for Healthcare Providers:  IncredibleEmployment.be  This test is no t yet approved or cleared by the [place] FDA and  has been authorized for detection and/or diagnosis of SARS-CoV-2 by FDA under an Emergency Use Authorization (EUA). This EUA will remain  in effect (meaning this test can be used) for the duration of the COVID-19 declaration under Section 564(b)(1) of the Act, 21 U.S.C.section 360bbb-3(b)(1), unless the authorization is terminated  or revoked sooner.       Influenza A by PCR NEGATIVE NEGATIVE Final   Influenza B by PCR NEGATIVE NEGATIVE Final    Comment: (NOTE) The Xpert Xpress SARS-CoV-2/FLU/RSV plus assay is intended as an aid in the diagnosis of influenza from Nasopharyngeal swab specimens and should not be used as a sole basis for treatment. Nasal washings and aspirates are  unacceptable for Xpert Xpress SARS-CoV-2/FLU/RSV testing.  Fact Sheet for Patients: EntrepreneurPulse.com.au  Fact Sheet for Healthcare Providers: IncredibleEmployment.be  This test is not yet approved or cleared by the [place] FDA and has been authorized for detection and/or diagnosis of SARS-CoV-2 by FDA under an Emergency Use Authorization (EUA). This EUA will remain in effect (meaning this test can be used) for the duration of the COVID-19 declaration under Section 564(b)(1) of the Act, 21 U.S.C. section 360bbb-3(b)(1), unless the authorization is terminated or revoked.  Performed at [facility], [address] [place], [place] [address]   Surgical PCR screen     Status: None   Collection Time: 07/11/21 11:00 PM   Specimen: Nasal Mucosa; Nasal Swab  Result Value Ref Range Status   MRSA, PCR NEGATIVE NEGATIVE Final   Staphylococcus aureus NEGATIVE NEGATIVE Final    Comment: (NOTE) The Xpert SA Assay (FDA approved for NASAL specimens in patients 7 years of age and older), is one component of a comprehensive surveillance program. It is not intended to diagnose infection nor to guide or monitor treatment. Performed at [facility], [place] [address] [place], [place] [address]     Labs: CBC: Recent Labs  Lab 07/09/21 1235 07/11/21 1900 07/12/21 0503 07/13/21 8270 07/14/21 0645 07/15/21 0356 07/16/21 0548  WBC 8.2   < > 8.8 8.7 13.0* 9.8 7.3  NEUTROABS 5.6  --   --   --   --   --   --   HGB 9.4*   < > 9.2* 10.1* 9.1* 8.9* 8.3*  HCT 30.9*   < > 30.4* 33.0* 31.1* 30.5* 28.8*  MCV 78.4*   < > 79.4* 79.3* 81.2 80.5 81.4  PLT 236   < > 222 241 239 247 231   < > = values in this interval not displayed.   Basic Metabolic Panel: Recent Labs  Lab 07/12/21 0503 07/13/21 0937 07/14/21 0645 07/15/21 0356 07/15/21 1003 07/16/21 0548  NA 136 136 135  --  135 134*  K 4.3 4.6 4.4  --  4.1  4.2  CL 105 105 106  --  104 104  CO2 '27 26 25  ' --  26 25  GLUCOSE 122* 137* 304*  --  210* 189*  BUN '18 19 22  ' --  19 25*  CREATININE 1.11* 1.07* 1.05*  --  0.89 0.94  CALCIUM 9.7 9.9 9.5  --  9.3 9.4  MG  --   --   --  1.9  --  2.0  PHOS  --   --   --   --  2.2* 2.8   Liver Function Tests: Recent Labs  Lab 07/09/21 1235 07/12/21 0503 07/13/21 0937 07/14/21 0645 07/15/21 1003 07/16/21 0548  AST '23 25 22 ' 45*  --   --   ALT 28 39 32 60*  --   --   ALKPHOS 173* 168* 172* 272*  --   --   BILITOT 0.3 0.3 0.3 0.2*  --   --   PROT 6.6 6.7 6.9 6.6  --   --   ALBUMIN 3.9 3.4* 3.4* 3.3* 3.1* 3.1*   CBG: Recent Labs  Lab 07/14/21 2143 07/15/21 0749 07/15/21 1209 07/15/21 1756 07/15/21 2154  GLUCAP 133* 149* 178* 215* 150*    Discharge time spent: greater than 30 minutes.  Signed: Mercy Riding, MD [facility] 07/16/2021

## 2021-07-16 NOTE — Progress Notes (Signed)
Physical Therapy Treatment ?Patient Details ?Name: Bethany [name] ?MRN: [id number] ?DOB: [birth year] ?Today's Date: 07/16/2021 ? ? ?History of Present Illness Patient is a 71 year old female who presented after a fall at home. patient was found to have nondisplaced fractures involving medial malleolus and distal fibula on the right as well as nondisplaced and noncomminuted transverse fracture across the inferior aspect of the patella on the left. Patient underwent L ankle ORIF on 07/13/21.  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  Pt with R breast lumpectomy 9/22, has port and RN reports was getting chemo ? ?  ?PT Comments  ? ? Pt is slowly progressing toward acute PT goals this session with progression of ambulation. Pt ambulated ~32ft with MIN -MOD A with use of RW while adhering to WB restrictions, limited by pain and increased fatigue. Continues to require +2 assist  and cues for sequencing with performance and safety with transfers. PT reviewed LE HEP for promotion for circulation/ROM, pt demonstrated understanding. Pt will benefit from continued skilled PT to increase their independence and maximize safety with mobility.  ?   ?Recommendations for follow up therapy are one component of a multi-disciplinary discharge planning process, led by the attending physician.  Recommendations may be updated based on patient status, additional functional criteria and insurance authorization. ? ?Follow Up Recommendations ? Skilled nursing-short term rehab (<3 hours/day) (denied for acute inpateint rehab) ?  ?  ?Assistance Recommended at Discharge Frequent or constant Supervision/Assistance  ?Patient can return home with the following Two people to help with bathing/dressing/bathroom;Two people to help with walking and/or transfers;Help with stairs or ramp for entrance;Assistance with cooking/housework;Assist for transportation ?  ?Equipment Recommendations ? Wheelchair (measurements PT);Wheelchair  cushion (measurements PT);BSC/3in1;Rolling walker (2 wheels) (with drop arms (or defer to SNF if approved))  ?  ?Recommendations for Other Services   ? ? ?  ?Precautions / Restrictions Precautions ?Precautions: Fall ?Precaution Comments: LLE- KI at all times, WBAT ?Required Braces or Orthoses: Knee Immobilizer - Left;Splint/Cast ?Knee Immobilizer - Left: On at all times ?Splint/Cast: Splint on R ankle ?Restrictions ?Weight Bearing Restrictions: Yes ?RLE Weight Bearing: Non weight bearing ?LLE Weight Bearing: Weight bearing as tolerated  ?  ? ?Mobility ? Bed Mobility ?Overal bed mobility: Needs Assistance ?Bed Mobility: Supine to Sit ?  ?  ?Supine to sit: Min guard, HOB elevated ?  ?  ?General bed mobility comments: Increased time, pt able to progress to EOB with use of bed rails and scoot hips toward EOB in prep for sit to stand transfers. Assist for support with R LE due to pain once off EOB. ?  ? ?Transfers ?Overall transfer level: Needs assistance ?Equipment used: Rolling walker (2 wheels) ?Transfers: Sit to/from Stand ?Sit to Stand: Min assist, Mod assist, +2 physical assistance, +2 safety/equipment, From elevated surface ?  ?  ?  ?  ?  ?General transfer comment: x2; MIN A +2 for power up from elevated EOB and MOD A+2 to rise from elevated recliner with cues for sequencing. Able to perform from slightly decreased height EOB today. MOD A for controlled descent when sitting. ?  ? ?Ambulation/Gait ?Ambulation/Gait assistance: Min assist, Mod assist, +2 safety/equipment ?Gait Distance (Feet): 10 Feet ?Assistive device: Rolling walker (2 wheels) ?Gait Pattern/deviations: Antalgic ?  ?  ?  ?General Gait Details: Pt able to perform hopping steps on L LE with KI donned. Limited by pain and UE fatigue x2 standing rest breaks during. ? ? ?Stairs ?  ?  ?  ?  ?  ? ? ?  Wheelchair Mobility ?  ? ?Modified Rankin (Stroke Patients Only) ?  ? ? ?  ?Balance Overall balance assessment: Needs assistance ?Sitting-balance support: Feet  supported ?Sitting balance-Leahy Scale: Fair ?  ?  ?Standing balance support: Bilateral upper extremity supported, Reliant on assistive device for balance, During functional activity ?Standing balance-Leahy Scale: Poor ?Standing balance comment: use of RW and external support +2 ?  ?  ?  ?  ?  ?  ?  ?  ?  ?  ?  ?  ? ?  ?Cognition Arousal/Alertness: Awake/alert ?Behavior During Therapy: [facility] for tasks assessed/performed ?Overall Cognitive Status: Within Functional Limits for tasks assessed ?  ?  ?  ?  ?  ?  ?  ?  ?  ?  ?  ?  ?  ?  ?  ?  ?  ?  ?  ? ?  ?Exercises General Exercises - Lower Extremity ?Ankle Circles/Pumps: AROM, Left, 20 reps, Seated ?Gluteal Sets: AROM, Both, 10 reps, Seated ?Hip ABduction/ADduction: AAROM, Both, 10 reps, Seated ? ?  ?General Comments   ?  ?  ? ?Pertinent Vitals/Pain Pain Assessment ?Pain Assessment: 0-10 ?Pain Score: 8  ?Pain Location: R ankle ?Pain Descriptors / Indicators: Discomfort, Sore, Tender ?Pain Intervention(s): Limited activity within patient's tolerance, Monitored during session, Repositioned  ? ? ?Home Living   ?  ?  ?  ?  ?  ?  ?  ?  ?  ?   ?  ?Prior Function    ?  ?  ?   ? ?PT Goals (current goals can now be found in the care plan section) Acute Rehab PT Goals ?Patient Stated Goal: decrease pain ?PT Goal Formulation: With patient ?Time For Goal Achievement: 07/28/21 ?Potential to Achieve Goals: Good ?Progress towards PT goals: Progressing toward goals ? ?  ?Frequency ? ? ? Min 3X/week ? ? ? ?  ?PT Plan Discharge plan needs to be updated  ? ? ?Co-evaluation   ?  ?  ?  ?  ? ?  ?AM-PAC PT "6 Clicks" Mobility   ?Outcome Measure ? Help needed turning from your back to your side while in a flat bed without using bedrails?: A Lot ?Help needed moving from lying on your back to sitting on the side of a flat bed without using bedrails?: A Lot ?Help needed moving to and from a bed to a chair (including a wheelchair)?: Total ?Help needed standing up from a chair using your arms (e.g.,  wheelchair or bedside chair)?: Total ?Help needed to walk in hospital room?: A Lot ?Help needed climbing 3-5 steps with a railing? : Total ?6 Click Score: 9 ? ?  ?End of Session Equipment Utilized During Treatment: Gait belt ?Activity Tolerance: Patient limited by pain;Patient limited by fatigue ?Patient left: with call bell/phone within reach;in bed (R LE elevated) ?Nurse Communication: Mobility status ?PT Visit Diagnosis: Other abnormalities of gait and mobility (R26.89);Muscle weakness (generalized) (M62.81);Unsteadiness on feet (R26.81);Pain ?Pain - Right/Left: Right ?Pain - part of body: Ankle and joints of foot ?  ? ? ?Time: 5366-4403 ?PT Time Calculation (min) (ACUTE ONLY): 32 min ? ?Charges:  $Gait Training: 8-22 mins ?$Therapeutic Exercise: 8-22 mins          ?          ? ?Festus Barren., PT, DPT  ?Acute Rehabilitation Services  ?Office [phone] ? ?07/16/2021, 12:35 PM ? ?

## 2021-07-16 NOTE — TOC Transition Note (Addendum)
Transition of Care (TOC) - CM/SW Discharge Note ? ? ?Patient Details  ?Name: Bethany [name] ?MRN: [id number] ?Date of Birth: Dec 14, 1950 ? ?Transition of Care (TOC) CM/SW Contact:  ?Tawanna Cooler, RN ?Phone Number: ?07/16/2021, 12:14 PM ? ? ?Clinical Narrative:    ? ?Patient going to [facility], room 111.  Number to call report is [phone].  [facility] called at 12:12 pm.  ? ? ?Final next level of care: [place] ?Barriers to Discharge: Barriers Resolved ? ? ?Patient Goals and CMS Choice ?Patient states their goals for this hospitalization and ongoing recovery are:: return home ?  ?Choice offered to / list presented to : Patient ? ?Discharge Placement ?PASRR number recieved: 07/15/21 ?           ?Patient chooses bed at: [facility] ?Patient to be transferred to facility by: [facility] ?Name of family member notified: patient to notify ?Patient and family notified of of transfer: 07/16/21 ? ?Discharge Plan and Services ?  ?Discharge Planning Services: CM Consult ?Post Acute Care Choice: [place]           ?  ?  ?  ?  ?Readmission Risk Interventions ?No flowsheet data found. ? ? ? ? ?

## 2021-07-16 NOTE — Discharge Planning (Signed)
Patient's discharge instructions went over with patient and report given to snf. Patients port deaccessed and patient was picked up by ptar with belongings.  ?

## 2021-07-21 ENCOUNTER — Other Ambulatory Visit: Payer: Self-pay

## 2021-07-21 ENCOUNTER — Inpatient Hospital Stay: Payer: No Typology Code available for payment source | Attending: Hematology

## 2021-07-21 DIAGNOSIS — Q2733 Arteriovenous malformation of digestive system vessel: Secondary | ICD-10-CM | POA: Diagnosis present

## 2021-07-21 DIAGNOSIS — D500 Iron deficiency anemia secondary to blood loss (chronic): Secondary | ICD-10-CM | POA: Diagnosis present

## 2021-07-21 DIAGNOSIS — K5520 Angiodysplasia of colon without hemorrhage: Secondary | ICD-10-CM

## 2021-07-21 DIAGNOSIS — Z452 Encounter for adjustment and management of vascular access device: Secondary | ICD-10-CM | POA: Insufficient documentation

## 2021-07-21 LAB — CBC WITH DIFFERENTIAL (CANCER CENTER ONLY)
Abs Immature Granulocytes: 0.07 10*3/uL (ref 0.00–0.07)
Basophils Absolute: 0 10*3/uL (ref 0.0–0.1)
Basophils Relative: 0 %
Eosinophils Absolute: 0.2 10*3/uL (ref 0.0–0.5)
Eosinophils Relative: 3 %
HCT: 29.7 % — ABNORMAL LOW (ref 36.0–46.0)
Hemoglobin: 9.1 g/dL — ABNORMAL LOW (ref 12.0–15.0)
Immature Granulocytes: 1 %
Lymphocytes Relative: 15 %
Lymphs Abs: 1.4 10*3/uL (ref 0.7–4.0)
MCH: 23.7 pg — ABNORMAL LOW (ref 26.0–34.0)
MCHC: 30.6 g/dL (ref 30.0–36.0)
MCV: 77.3 fL — ABNORMAL LOW (ref 80.0–100.0)
Monocytes Absolute: 0.7 10*3/uL (ref 0.1–1.0)
Monocytes Relative: 8 %
Neutro Abs: 6.9 10*3/uL (ref 1.7–7.7)
Neutrophils Relative %: 73 %
Platelet Count: 411 10*3/uL — ABNORMAL HIGH (ref 150–400)
RBC: 3.84 MIL/uL — ABNORMAL LOW (ref 3.87–5.11)
RDW: 18.2 % — ABNORMAL HIGH (ref 11.5–15.5)
WBC Count: 9.3 10*3/uL (ref 4.0–10.5)
nRBC: 0 % (ref 0.0–0.2)

## 2021-07-21 LAB — CMP (CANCER CENTER ONLY)
ALT: 81 U/L — ABNORMAL HIGH (ref 0–44)
AST: 40 U/L (ref 15–41)
Albumin: 4 g/dL (ref 3.5–5.0)
Alkaline Phosphatase: 296 U/L — ABNORMAL HIGH (ref 38–126)
Anion gap: 4 — ABNORMAL LOW (ref 5–15)
BUN: 30 mg/dL — ABNORMAL HIGH (ref 8–23)
CO2: 30 mmol/L (ref 22–32)
Calcium: 10.9 mg/dL — ABNORMAL HIGH (ref 8.9–10.3)
Chloride: 103 mmol/L (ref 98–111)
Creatinine: 1.22 mg/dL — ABNORMAL HIGH (ref 0.44–1.00)
GFR, Estimated: 47 mL/min — ABNORMAL LOW (ref >60)
Glucose, Bld: 226 mg/dL — ABNORMAL HIGH (ref 70–99)
Potassium: 4.6 mmol/L (ref 3.5–5.1)
Sodium: 137 mmol/L (ref 135–145)
Total Bilirubin: 0.3 mg/dL (ref 0.3–1.2)
Total Protein: 7.1 g/dL (ref 6.5–8.1)

## 2021-07-21 LAB — IRON AND IRON BINDING CAPACITY (CC-WL,HP ONLY)
Iron: 51 ug/dL (ref 28–170)
Saturation Ratios: 15 % (ref 10.4–31.8)
TIBC: 343 ug/dL (ref 250–450)
UIBC: 292 ug/dL (ref 148–442)

## 2021-07-21 LAB — FERRITIN: Ferritin: 556 ng/mL — ABNORMAL HIGH (ref 11–307)

## 2021-07-21 MED ORDER — SODIUM CHLORIDE 0.9% FLUSH
10.0000 mL | Freq: Once | INTRAVENOUS | Status: AC
  Administered 2021-07-21: 10 mL

## 2021-07-21 MED ORDER — HEPARIN SOD (PORK) LOCK FLUSH 100 UNIT/ML IV SOLN
500.0000 [IU] | Freq: Once | INTRAVENOUS | Status: AC
  Administered 2021-07-21: 500 [IU]

## 2021-07-28 ENCOUNTER — Other Ambulatory Visit: Payer: Self-pay | Admitting: Hematology

## 2021-07-28 ENCOUNTER — Ambulatory Visit
Admit: 2021-07-28 | Discharge: 2021-07-28 | Disposition: A | Discharge status: Home / Self Care | Payer: Medicare Other | Attending: Hematology | Admitting: Hematology

## 2021-07-28 ENCOUNTER — Other Ambulatory Visit: Payer: Self-pay

## 2021-07-28 DIAGNOSIS — N6310 Unspecified lump in the right breast, unspecified quadrant: Secondary | ICD-10-CM

## 2021-07-28 DIAGNOSIS — N6315 Unspecified lump in the right breast, overlapping quadrants: Secondary | ICD-10-CM

## 2021-07-28 HISTORY — PX: BREAST BIOPSY: SHX20

## 2021-07-28 IMAGING — MR MR BREAST BILAT WO/W CM
7 of 10 series · 30 of 48 positions shown · IV contrast (gadavist)
Comparison: Recent mammograms and ultrasounds.  07/28/2020 PET CT

CLINICAL DATA: 69-year-old female with hypermetabolic OUTER RIGHT
breast mass identified on 07/28/2020 PET CT. Follow-up on 07/18/2020
demonstrated a 0.7 cm OUTER RIGHT breast mass identified
sonographically but a much larger asymmetry mammographically which
more closely approximated the size of the PET-CT abnormality. The
0.7 cm mass identified sonographically was biopsy demonstrating a
papilloma. However, given increased metabolic activity on PET CT and
larger area identified mammographically, MRI evaluation is
recommended.

History of remote biopsy-proven fibroadenoma within the anterior
OUTER RIGHT breast.
LABS:  None performed today
EXAM:
BILATERAL BREAST MRI WITH AND WITHOUT CONTRAST
TECHNIQUE: Multiplanar, multisequence MR images of both breasts were obtained
prior to and following the intravenous administration of 8 ml of
Gadavist

[Series 2: t2_tirm_tra ipat (a-p) · axial · 3.0mm · 0.70mm/px · z∈[-36,+126]mm · 3 of 55 slices shown]
[im 1/55]
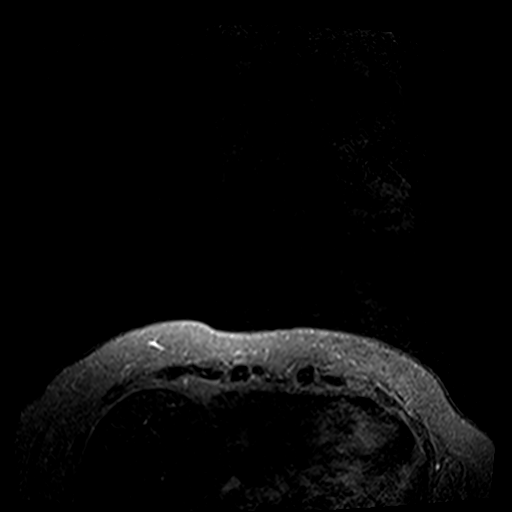
[im 28/55]
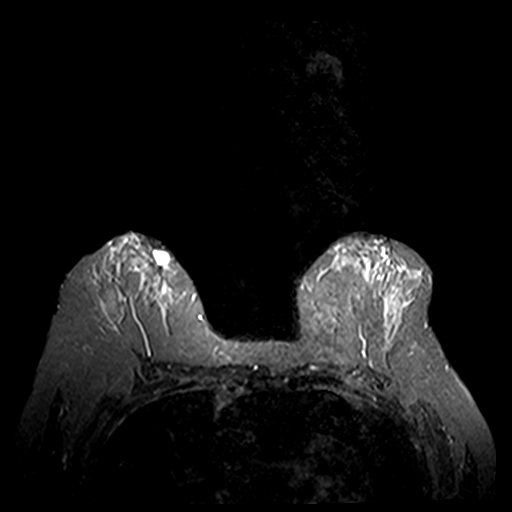
[im 55/55]
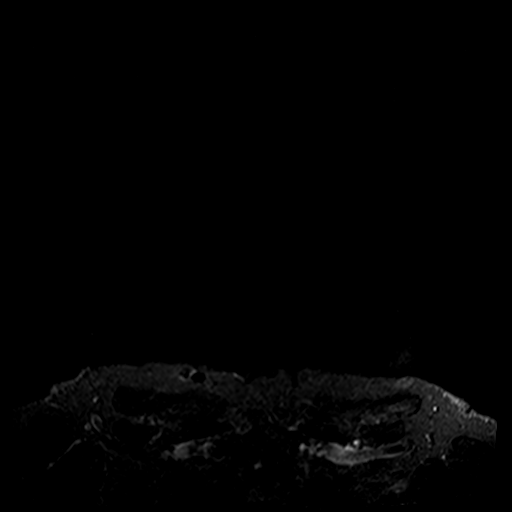

[Series 3: fl3d pre-cm no · axial · non-contrast · 1.2mm · 0.94mm/px · z∈[-43,+129]mm · 5 of 144 slices shown]
[im 1/144]
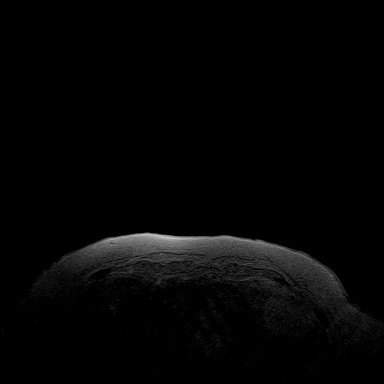
[im 36/144]
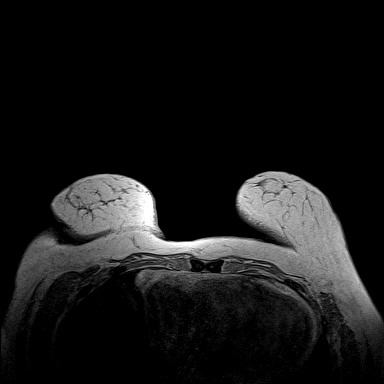
[im 72/144]
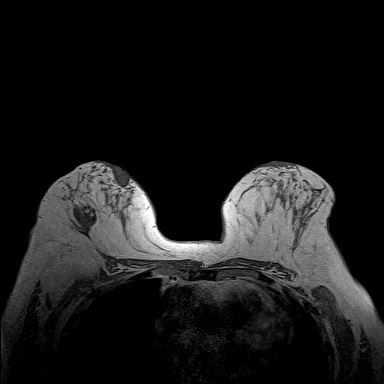
[im 108/144]
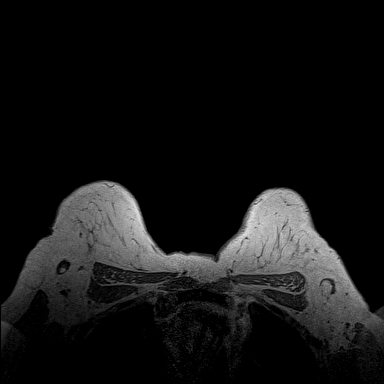
[im 144/144]
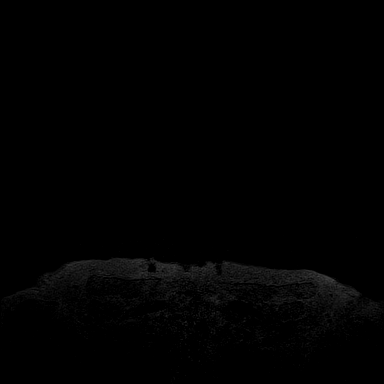

[Series 4: fl3d pre-cm · axial · non-contrast · 1.2mm · 0.94mm/px · z∈[-43,+129]mm · 5 of 144 slices shown (1 of 2)]
[im 1/144]
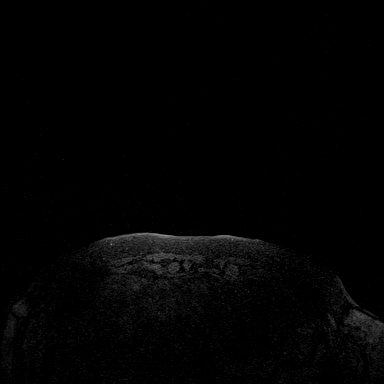
[im 36/144]
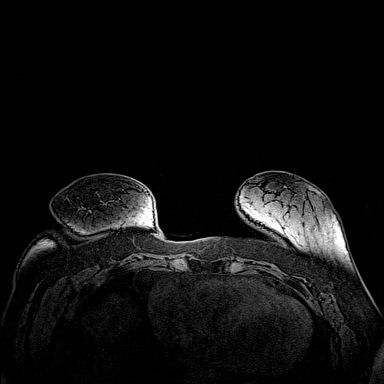
[im 72/144]
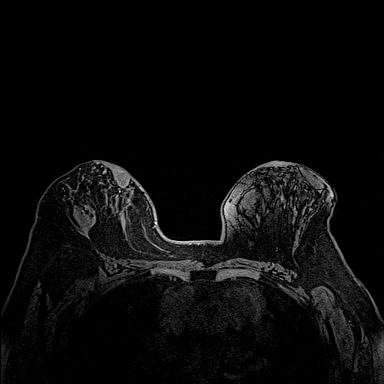
[im 108/144]
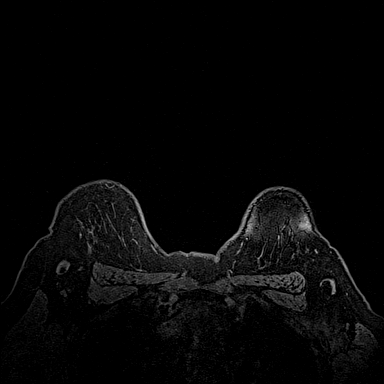
[im 144/144]
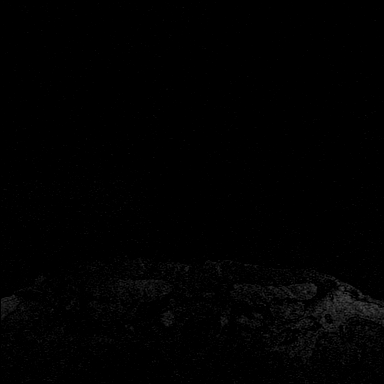

[Series 5: fl3d pre-cm · axial · non-contrast · 1.2mm · 0.94mm/px · z∈[-42,+129]mm · 5 of 144 slices shown (2 of 2)]
[im 1/144]
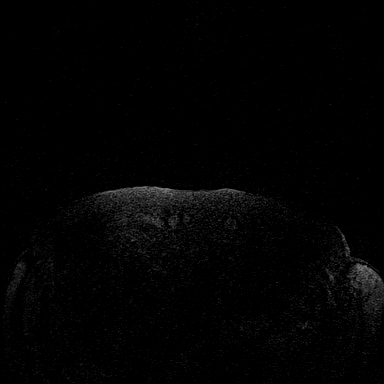
[im 36/144]
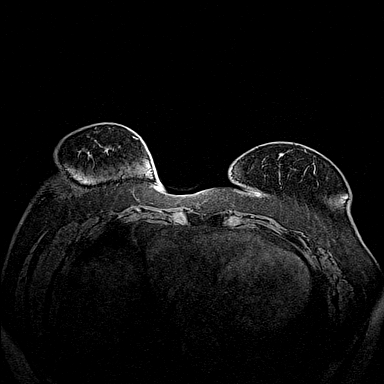
[im 72/144]
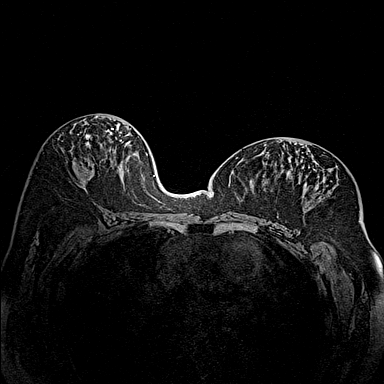
[im 108/144]
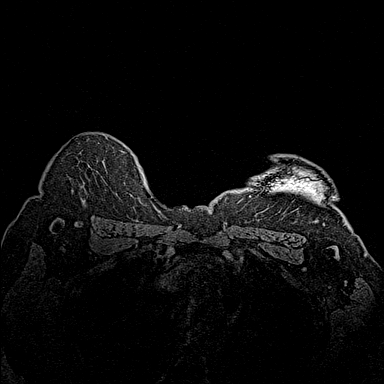
[im 144/144]
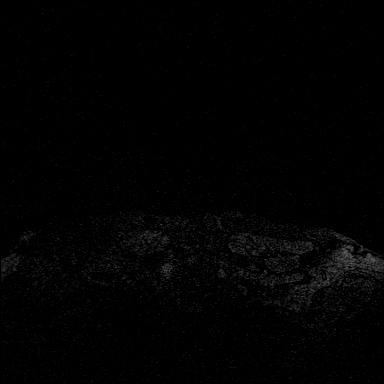

[Series 6: fl3d post-cm 20 · axial · 1.2mm · 0.94mm/px · z∈[-42,+129]mm · 5 of 144 slices shown (1 of 2)]
[im 1/144]
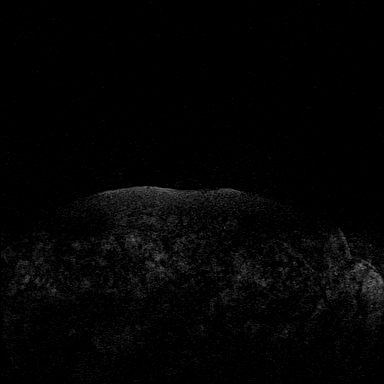
[im 36/144]
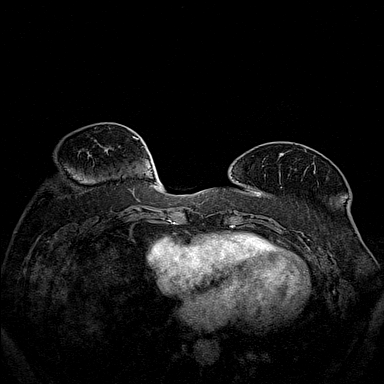
[im 72/144]
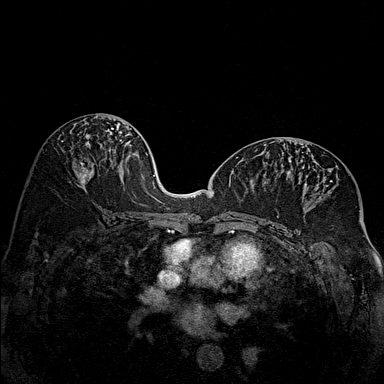
[im 108/144]
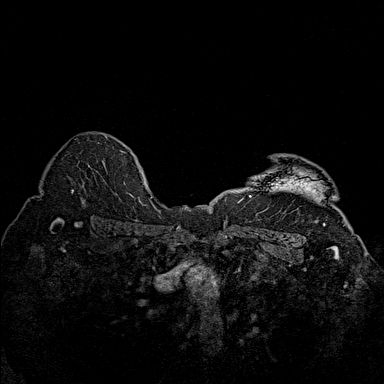
[im 144/144]
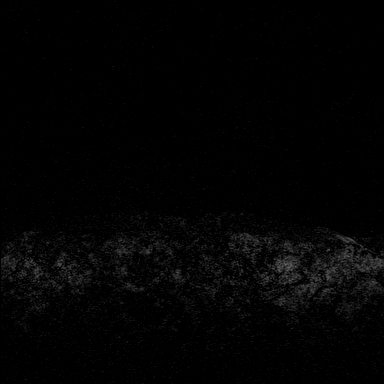

[Series 7: fl3d post-cm 20 · axial · 1.2mm · 0.94mm/px · z∈[-42,+129]mm · 5 of 144 slices shown (2 of 2)]
[im 1/144]
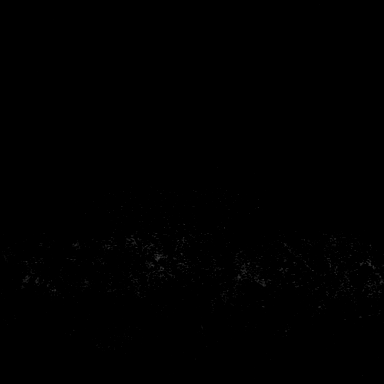
[im 36/144]
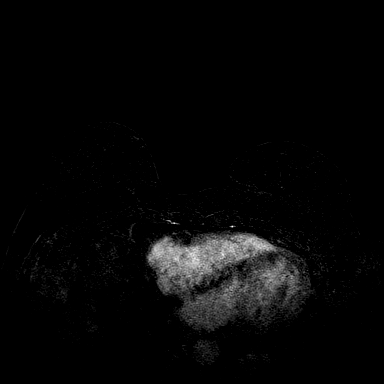
[im 72/144]
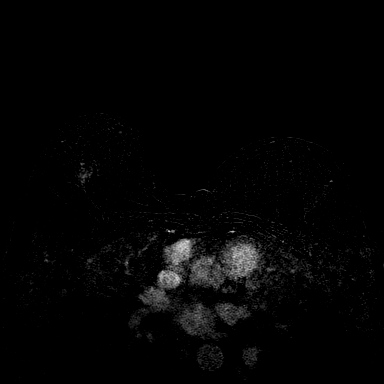
[im 108/144]
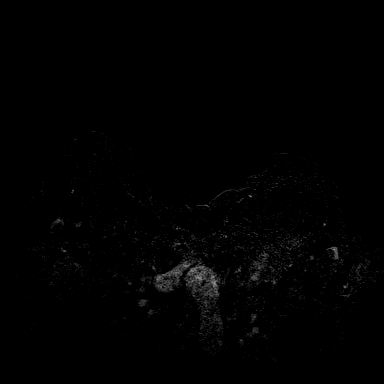
[im 144/144]
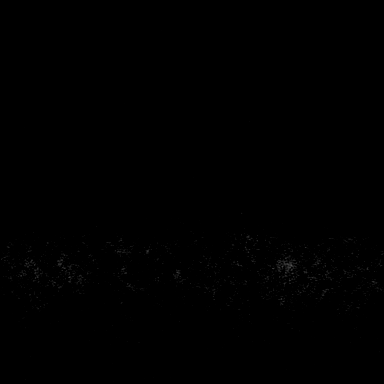

[Series 9: fl3d post-cm 3min · axial · 1.2mm · 0.94mm/px · z∈[-42,-0]mm · 2 of 144 slices shown]
[im 1/144]
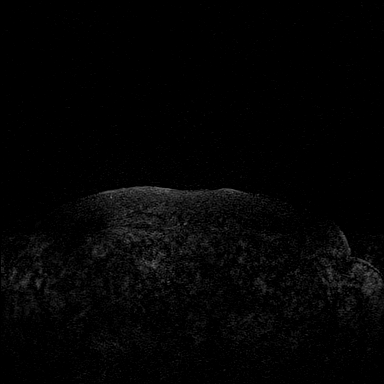
[im 36/144]
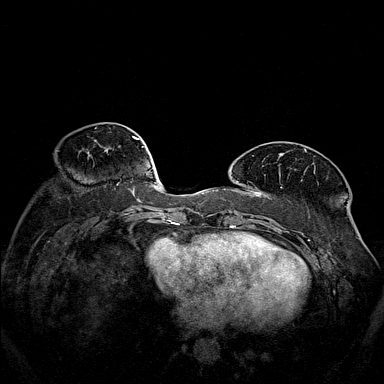

[30 of 48 positions shown; findings below may reference images not displayed]

Three-dimensional MR images were rendered by post-processing of the
original MR data on an independent workstation. The
three-dimensional MR images were interpreted, and findings are
reported in the following complete MRI report for this study. Three
dimensional images were evaluated at the independent interpreting
workstation using the DynaCAD thin client.
FINDINGS: Breast composition: b. Scattered fibroglandular tissue.

Background parenchymal enhancement: Mild

Right breast: A 4 x 2.7 x 3 cm area of OUTER RIGHT breast non
masslike enhancement (middle 3rd) with persistent kinetics is
identified (series 13: Images 71-89). Biopsy clip artifact within
the anterior aspect of this non masslike enhancement is noted.

A bilobed circumscribed oval mass containing biopsy clip artifact
within the anterior OUTER RIGHT breast is compatible with known
biopsy-proven malignancy.

A 1 cm benign cyst within the anterior INNER LEFT breast is present.

Left breast: No mass or abnormal enhancement.

Lymph nodes: No abnormal appearing lymph nodes.

Ancillary findings:  None.
IMPRESSION: 1. 4 x 2.7 x 3 cm OUTER RIGHT breast non masslike enhancement, with
recent biopsy of a 0.7 cm OUTER RIGHT breast mass (which lies within
the anterior aspect of this enhancement) demonstrating a papilloma.
However, given MR enhancement characteristics and that this area was
hypermetabolic on PET CT, MR guided biopsy of the central/posterior
aspect of this non masslike enhancement is recommended.
2. No other suspicious findings noted.
3. Benign biopsy-proven fibroadenoma within the anterior OUTER RIGHT
breast.

RECOMMENDATION:
MR guided RIGHT breast biopsy.

BI-RADS CATEGORY  4: Suspicious.

## 2021-08-09 ENCOUNTER — Other Ambulatory Visit: Payer: Medicare Other

## 2021-08-17 ENCOUNTER — Ambulatory Visit
Admission: RE | Admit: 2021-08-17 | Discharge: 2021-08-17 | Disposition: A | Discharge status: Home / Self Care | Payer: Medicare Other | Source: Ambulatory Visit | Attending: Hematology | Admitting: Hematology

## 2021-08-17 DIAGNOSIS — N6310 Unspecified lump in the right breast, unspecified quadrant: Secondary | ICD-10-CM

## 2021-08-17 HISTORY — PX: BREAST BIOPSY: SHX20

## 2021-08-18 ENCOUNTER — Other Ambulatory Visit: Payer: Self-pay

## 2021-08-18 ENCOUNTER — Other Ambulatory Visit: Payer: Self-pay | Admitting: Hematology

## 2021-08-18 ENCOUNTER — Inpatient Hospital Stay: Payer: Medicare Other | Attending: Hematology

## 2021-08-18 DIAGNOSIS — N6310 Unspecified lump in the right breast, unspecified quadrant: Secondary | ICD-10-CM

## 2021-08-18 DIAGNOSIS — Q2733 Arteriovenous malformation of digestive system vessel: Secondary | ICD-10-CM | POA: Diagnosis present

## 2021-08-18 DIAGNOSIS — K5520 Angiodysplasia of colon without hemorrhage: Secondary | ICD-10-CM

## 2021-08-18 DIAGNOSIS — D500 Iron deficiency anemia secondary to blood loss (chronic): Secondary | ICD-10-CM | POA: Insufficient documentation

## 2021-08-18 DIAGNOSIS — Z452 Encounter for adjustment and management of vascular access device: Secondary | ICD-10-CM | POA: Insufficient documentation

## 2021-08-18 LAB — CMP (CANCER CENTER ONLY)
ALT: 142 U/L — ABNORMAL HIGH (ref 0–44)
AST: 139 U/L — ABNORMAL HIGH (ref 15–41)
Albumin: 3.9 g/dL (ref 3.5–5.0)
Alkaline Phosphatase: 315 U/L — ABNORMAL HIGH (ref 38–126)
Anion gap: 5 (ref 5–15)
BUN: 25 mg/dL — ABNORMAL HIGH (ref 8–23)
CO2: 29 mmol/L (ref 22–32)
Calcium: 10.8 mg/dL — ABNORMAL HIGH (ref 8.9–10.3)
Chloride: 103 mmol/L (ref 98–111)
Creatinine: 1.35 mg/dL — ABNORMAL HIGH (ref 0.44–1.00)
GFR, Estimated: 42 mL/min — ABNORMAL LOW (ref >60)
Glucose, Bld: 147 mg/dL — ABNORMAL HIGH (ref 70–99)
Potassium: 4.7 mmol/L (ref 3.5–5.1)
Sodium: 137 mmol/L (ref 135–145)
Total Bilirubin: 0.3 mg/dL (ref 0.3–1.2)
Total Protein: 7 g/dL (ref 6.5–8.1)

## 2021-08-18 LAB — CBC WITH DIFFERENTIAL (CANCER CENTER ONLY)
Abs Immature Granulocytes: 0.03 10*3/uL (ref 0.00–0.07)
Basophils Absolute: 0 10*3/uL (ref 0.0–0.1)
Basophils Relative: 0 %
Eosinophils Absolute: 0.2 10*3/uL (ref 0.0–0.5)
Eosinophils Relative: 3 %
HCT: 31 % — ABNORMAL LOW (ref 36.0–46.0)
Hemoglobin: 8.8 g/dL — ABNORMAL LOW (ref 12.0–15.0)
Immature Granulocytes: 0 %
Lymphocytes Relative: 20 %
Lymphs Abs: 1.4 10*3/uL (ref 0.7–4.0)
MCH: 23 pg — ABNORMAL LOW (ref 26.0–34.0)
MCHC: 28.4 g/dL — ABNORMAL LOW (ref 30.0–36.0)
MCV: 80.9 fL (ref 80.0–100.0)
Monocytes Absolute: 0.6 10*3/uL (ref 0.1–1.0)
Monocytes Relative: 9 %
Neutro Abs: 4.7 10*3/uL (ref 1.7–7.7)
Neutrophils Relative %: 68 %
Platelet Count: 261 10*3/uL (ref 150–400)
RBC: 3.83 MIL/uL — ABNORMAL LOW (ref 3.87–5.11)
RDW: 17.8 % — ABNORMAL HIGH (ref 11.5–15.5)
WBC Count: 6.9 10*3/uL (ref 4.0–10.5)
nRBC: 0 % (ref 0.0–0.2)

## 2021-08-18 LAB — IRON AND IRON BINDING CAPACITY (CC-WL,HP ONLY)
Iron: 38 ug/dL (ref 28–170)
Saturation Ratios: 10 % — ABNORMAL LOW (ref 10.4–31.8)
TIBC: 398 ug/dL (ref 250–450)
UIBC: 360 ug/dL (ref 148–442)

## 2021-08-18 LAB — FERRITIN: Ferritin: 149 ng/mL (ref 11–307)

## 2021-08-18 MED ORDER — SODIUM CHLORIDE 0.9% FLUSH
10.0000 mL | Freq: Once | INTRAVENOUS | Status: AC
  Administered 2021-08-18: 10 mL

## 2021-08-18 MED ORDER — HEPARIN SOD (PORK) LOCK FLUSH 100 UNIT/ML IV SOLN
500.0000 [IU] | Freq: Once | INTRAVENOUS | Status: AC
  Administered 2021-08-18: 500 [IU]

## 2021-08-24 LAB — AEROBIC/ANAEROBIC CULTURE W GRAM STAIN (SURGICAL/DEEP WOUND): Special Requests: NORMAL

## 2021-08-27 ENCOUNTER — Other Ambulatory Visit: Payer: Self-pay

## 2021-08-27 NOTE — Progress Notes (Signed)
Patient called and is aware of iron levels and to expect a call from scheduling to arrange.  Scheduling message and message for auth sent.

## 2021-08-31 ENCOUNTER — Other Ambulatory Visit: Payer: Medicare Other

## 2021-09-01 ENCOUNTER — Encounter (HOSPITAL_COMMUNITY): Payer: Self-pay

## 2021-09-08 ENCOUNTER — Ambulatory Visit
Admission: RE | Admit: 2021-09-08 | Discharge: 2021-09-08 | Disposition: A | Discharge status: Home / Self Care | Payer: Medicare Other | Source: Ambulatory Visit | Attending: Hematology | Admitting: Hematology

## 2021-09-08 DIAGNOSIS — N6310 Unspecified lump in the right breast, unspecified quadrant: Secondary | ICD-10-CM

## 2021-09-13 ENCOUNTER — Other Ambulatory Visit: Payer: Self-pay | Admitting: Hematology

## 2021-09-13 ENCOUNTER — Inpatient Hospital Stay: Payer: Medicare Other | Attending: Hematology

## 2021-09-13 ENCOUNTER — Other Ambulatory Visit: Payer: Self-pay

## 2021-09-13 VITALS — BP 124/49 | HR 78 | Temp 98.4°F | Resp 17 | Ht 65.0 in | Wt 181.4 lb

## 2021-09-13 DIAGNOSIS — Q2733 Arteriovenous malformation of digestive system vessel: Secondary | ICD-10-CM | POA: Diagnosis not present

## 2021-09-13 DIAGNOSIS — F17210 Nicotine dependence, cigarettes, uncomplicated: Secondary | ICD-10-CM | POA: Diagnosis not present

## 2021-09-13 DIAGNOSIS — D500 Iron deficiency anemia secondary to blood loss (chronic): Secondary | ICD-10-CM | POA: Insufficient documentation

## 2021-09-13 DIAGNOSIS — E1142 Type 2 diabetes mellitus with diabetic polyneuropathy: Secondary | ICD-10-CM | POA: Insufficient documentation

## 2021-09-13 DIAGNOSIS — N183 Chronic kidney disease, stage 3 unspecified: Secondary | ICD-10-CM | POA: Insufficient documentation

## 2021-09-13 DIAGNOSIS — I129 Hypertensive chronic kidney disease with stage 1 through stage 4 chronic kidney disease, or unspecified chronic kidney disease: Secondary | ICD-10-CM | POA: Diagnosis not present

## 2021-09-13 DIAGNOSIS — R918 Other nonspecific abnormal finding of lung field: Secondary | ICD-10-CM | POA: Diagnosis not present

## 2021-09-13 DIAGNOSIS — E1122 Type 2 diabetes mellitus with diabetic chronic kidney disease: Secondary | ICD-10-CM | POA: Insufficient documentation

## 2021-09-13 DIAGNOSIS — K5520 Angiodysplasia of colon without hemorrhage: Secondary | ICD-10-CM

## 2021-09-13 MED ORDER — SODIUM CHLORIDE 0.9 % IV SOLN
125.0000 mg | Freq: Once | INTRAVENOUS | Status: AC
  Administered 2021-09-13: 125 mg via INTRAVENOUS
  Filled 2021-09-13: qty 10

## 2021-09-13 MED ORDER — SODIUM CHLORIDE 0.9% FLUSH
10.0000 mL | Freq: Once | INTRAVENOUS | Status: AC
  Administered 2021-09-13: 10 mL

## 2021-09-13 MED ORDER — SODIUM CHLORIDE 0.9 % IV SOLN: Freq: Once | INTRAVENOUS | Status: AC

## 2021-09-13 MED ORDER — HEPARIN SOD (PORK) LOCK FLUSH 100 UNIT/ML IV SOLN
500.0000 [IU] | Freq: Once | INTRAVENOUS | Status: AC
  Administered 2021-09-13: 500 [IU]

## 2021-09-13 NOTE — Progress Notes (Addendum)
MD would like patient to receive Ferrlecit '250mg'$  today. ?Additional Ferrlecit '125mg'$  entered for a total Ferrlecit dose of '250mg'$  today. ? ?Acquanetta Belling, [place], BCPS, BCOP ?09/13/2021 ?2:59 PM ? ?

## 2021-09-13 NOTE — Progress Notes (Signed)
Pt refused premedications for Ferrlecit inf today. Pt stated "I have never gotten them before, I do not need them."  ?

## 2021-09-13 NOTE — Patient Instructions (Signed)
Sodium Ferric Gluconate Complex Injection What is this medication? SODIUM FERRIC GLUCONATE COMPLEX (SOE dee um FER ik GLOO koe nate KOM pleks) treats low levels of iron (iron deficiency anemia) in people with kidney disease. Iron is a mineral that plays an important role in making red blood cells, which carry oxygen from your lungs to the rest of your body. This medicine may be used for other purposes; ask your health care provider or pharmacist if you have questions. COMMON BRAND NAME(S): Ferrlecit, Nulecit What should I tell my care team before I take this medication? They need to know if you have any of the following conditions: Anemia that is not from iron deficiency High levels of iron in the blood An unusual or allergic reaction to iron, other medications, foods, dyes, or preservatives Pregnant or are trying to become pregnant Breast-feeding How should I use this medication? This medication is injected into a vein. It is given by your care team in a hospital or clinic setting. Talk to your care team about the use of this medication in children. While it may be prescribed for children as young as 6 years for selected conditions, precautions do apply. Overdosage: If you think you have taken too much of this medicine contact a poison control center or emergency room at once. NOTE: This medicine is only for you. Do not share this medicine with others. What if I miss a dose? It is important not to miss your dose. Call your care team if you are unable to keep an appointment. What may interact with this medication? Do not take this medication with any of the following: Deferasirox Deferoxamine Dimercaprol This medication may also interact with the following: Other iron products This list may not describe all possible interactions. Give your health care provider a list of all the medicines, herbs, non-prescription drugs, or dietary supplements you use. Also tell them if you smoke, drink  alcohol, or use illegal drugs. Some items may interact with your medicine. What should I watch for while using this medication? Your condition will be monitored carefully while you are receiving this medication. Visit your care team for regular checks on your progress. You may need blood work while you are taking this medication. What side effects may I notice from receiving this medication? Side effects that you should report to your care team as soon as possible: Allergic reactions--skin rash, itching, hives, swelling of the face, lips, tongue, or throat Low blood pressure--dizziness, feeling faint or lightheaded, blurry vision Shortness of breath Side effects that usually do not require medical attention (report to your care team if they continue or are bothersome): Flushing Headache Joint pain Muscle pain Nausea Pain, redness, or irritation at injection site This list may not describe all possible side effects. Call your doctor for medical advice about side effects. You may report side effects to FDA at 1-800-FDA-1088. Where should I keep my medication? This medication is given in a hospital or clinic and will not be stored at home. NOTE: This sheet is a summary. It may not cover all possible information. If you have questions about this medicine, talk to your doctor, pharmacist, or health care provider.  2023 Elsevier/Gold Standard (2020-09-25 00:00:00)  

## 2021-09-15 ENCOUNTER — Inpatient Hospital Stay: Payer: Medicare Other | Admitting: Hematology

## 2021-09-15 ENCOUNTER — Inpatient Hospital Stay: Payer: Medicare Other

## 2021-09-20 ENCOUNTER — Inpatient Hospital Stay: Payer: Medicare Other

## 2021-09-20 ENCOUNTER — Other Ambulatory Visit: Payer: Self-pay

## 2021-09-20 VITALS — BP 137/50 | HR 66 | Temp 98.6°F | Resp 18 | Wt 169.8 lb

## 2021-09-20 DIAGNOSIS — Q2733 Arteriovenous malformation of digestive system vessel: Secondary | ICD-10-CM | POA: Diagnosis not present

## 2021-09-20 DIAGNOSIS — K5520 Angiodysplasia of colon without hemorrhage: Secondary | ICD-10-CM

## 2021-09-20 DIAGNOSIS — D500 Iron deficiency anemia secondary to blood loss (chronic): Secondary | ICD-10-CM

## 2021-09-20 MED ORDER — ACETAMINOPHEN 325 MG PO TABS
650.0000 mg | ORAL_TABLET | Freq: Once | ORAL | Status: AC
  Administered 2021-09-20: 650 mg via ORAL
  Filled 2021-09-20: qty 2

## 2021-09-20 MED ORDER — SODIUM CHLORIDE 0.9 % IV SOLN
250.0000 mg | Freq: Once | INTRAVENOUS | Status: AC
  Administered 2021-09-20: 250 mg via INTRAVENOUS
  Filled 2021-09-20: qty 20

## 2021-09-20 MED ORDER — SODIUM CHLORIDE 0.9 % IV SOLN: Freq: Once | INTRAVENOUS | Status: AC

## 2021-09-20 MED ORDER — HEPARIN SOD (PORK) LOCK FLUSH 100 UNIT/ML IV SOLN
500.0000 [IU] | Freq: Once | INTRAVENOUS | Status: AC
  Administered 2021-09-20: 500 [IU]

## 2021-09-20 MED ORDER — SODIUM CHLORIDE 0.9% FLUSH
10.0000 mL | Freq: Once | INTRAVENOUS | Status: AC
  Administered 2021-09-20: 10 mL

## 2021-09-20 MED ORDER — DIPHENHYDRAMINE HCL 50 MG/ML IJ SOLN
25.0000 mg | Freq: Once | INTRAMUSCULAR | Status: AC
  Administered 2021-09-20: 25 mg via INTRAVENOUS
  Filled 2021-09-20: qty 1

## 2021-09-20 NOTE — Progress Notes (Signed)
Patient declined to stay for 30 minutes post-observation period following administration of Ferrlecit infusion. Vital signs retaken and remained stable. Patient showed no signs of distress upon discharge.  ?

## 2021-09-20 NOTE — Patient Instructions (Signed)
Sodium Ferric Gluconate Complex Injection What is this medication? SODIUM FERRIC GLUCONATE COMPLEX (SOE dee um FER ik GLOO koe nate KOM pleks) treats low levels of iron (iron deficiency anemia) in people with kidney disease. Iron is a mineral that plays an important role in making red blood cells, which carry oxygen from your lungs to the rest of your body. This medicine may be used for other purposes; ask your health care provider or pharmacist if you have questions. COMMON BRAND NAME(S): Ferrlecit, Nulecit What should I tell my care team before I take this medication? They need to know if you have any of the following conditions: Anemia that is not from iron deficiency High levels of iron in the blood An unusual or allergic reaction to iron, other medications, foods, dyes, or preservatives Pregnant or are trying to become pregnant Breast-feeding How should I use this medication? This medication is injected into a vein. It is given by your care team in a hospital or clinic setting. Talk to your care team about the use of this medication in children. While it may be prescribed for children as young as 6 years for selected conditions, precautions do apply. Overdosage: If you think you have taken too much of this medicine contact a poison control center or emergency room at once. NOTE: This medicine is only for you. Do not share this medicine with others. What if I miss a dose? It is important not to miss your dose. Call your care team if you are unable to keep an appointment. What may interact with this medication? Do not take this medication with any of the following: Deferasirox Deferoxamine Dimercaprol This medication may also interact with the following: Other iron products This list may not describe all possible interactions. Give your health care provider a list of all the medicines, herbs, non-prescription drugs, or dietary supplements you use. Also tell them if you smoke, drink  alcohol, or use illegal drugs. Some items may interact with your medicine. What should I watch for while using this medication? Your condition will be monitored carefully while you are receiving this medication. Visit your care team for regular checks on your progress. You may need blood work while you are taking this medication. What side effects may I notice from receiving this medication? Side effects that you should report to your care team as soon as possible: Allergic reactions--skin rash, itching, hives, swelling of the face, lips, tongue, or throat Low blood pressure--dizziness, feeling faint or lightheaded, blurry vision Shortness of breath Side effects that usually do not require medical attention (report to your care team if they continue or are bothersome): Flushing Headache Joint pain Muscle pain Nausea Pain, redness, or irritation at injection site This list may not describe all possible side effects. Call your doctor for medical advice about side effects. You may report side effects to FDA at 1-800-FDA-1088. Where should I keep my medication? This medication is given in a hospital or clinic and will not be stored at home. NOTE: This sheet is a summary. It may not cover all possible information. If you have questions about this medicine, talk to your doctor, pharmacist, or health care provider.  2023 Elsevier/Gold Standard (2020-09-25 00:00:00)  

## 2021-09-27 ENCOUNTER — Inpatient Hospital Stay: Payer: Medicare Other

## 2021-09-27 ENCOUNTER — Other Ambulatory Visit: Payer: Self-pay

## 2021-09-27 VITALS — BP 144/54 | HR 56 | Temp 98.0°F | Resp 18

## 2021-09-27 DIAGNOSIS — Q2733 Arteriovenous malformation of digestive system vessel: Secondary | ICD-10-CM | POA: Diagnosis not present

## 2021-09-27 DIAGNOSIS — D500 Iron deficiency anemia secondary to blood loss (chronic): Secondary | ICD-10-CM

## 2021-09-27 DIAGNOSIS — K5520 Angiodysplasia of colon without hemorrhage: Secondary | ICD-10-CM

## 2021-09-27 MED ORDER — ACETAMINOPHEN 325 MG PO TABS
650.0000 mg | ORAL_TABLET | Freq: Once | ORAL | Status: AC
  Administered 2021-09-27: 650 mg via ORAL
  Filled 2021-09-27: qty 2

## 2021-09-27 MED ORDER — SODIUM CHLORIDE 0.9 % IV SOLN: Freq: Once | INTRAVENOUS | Status: AC

## 2021-09-27 MED ORDER — SODIUM CHLORIDE 0.9 % IV SOLN
250.0000 mg | Freq: Once | INTRAVENOUS | Status: AC
  Administered 2021-09-27: 250 mg via INTRAVENOUS
  Filled 2021-09-27: qty 20

## 2021-09-27 MED ORDER — SODIUM CHLORIDE 0.9% FLUSH
10.0000 mL | Freq: Once | INTRAVENOUS | Status: AC
  Administered 2021-09-27: 10 mL

## 2021-09-27 MED ORDER — HEPARIN SOD (PORK) LOCK FLUSH 100 UNIT/ML IV SOLN
500.0000 [IU] | Freq: Once | INTRAVENOUS | Status: AC
  Administered 2021-09-27: 500 [IU]

## 2021-09-27 MED ORDER — DIPHENHYDRAMINE HCL 50 MG/ML IJ SOLN
25.0000 mg | Freq: Once | INTRAMUSCULAR | Status: AC
  Administered 2021-09-27: 25 mg via INTRAVENOUS
  Filled 2021-09-27: qty 1

## 2021-09-27 NOTE — Patient Instructions (Signed)
Sodium Ferric Gluconate Complex Injection What is this medication? SODIUM FERRIC GLUCONATE COMPLEX (SOE dee um FER ik GLOO koe nate KOM pleks) treats low levels of iron (iron deficiency anemia) in people with kidney disease. Iron is a mineral that plays an important role in making red blood cells, which carry oxygen from your lungs to the rest of your body. This medicine may be used for other purposes; ask your health care provider or pharmacist if you have questions. COMMON BRAND NAME(S): Ferrlecit, Nulecit What should I tell my care team before I take this medication? They need to know if you have any of the following conditions: Anemia that is not from iron deficiency High levels of iron in the blood An unusual or allergic reaction to iron, other medications, foods, dyes, or preservatives Pregnant or are trying to become pregnant Breast-feeding How should I use this medication? This medication is injected into a vein. It is given by your care team in a hospital or clinic setting. Talk to your care team about the use of this medication in children. While it may be prescribed for children as young as 6 years for selected conditions, precautions do apply. Overdosage: If you think you have taken too much of this medicine contact a poison control center or emergency room at once. NOTE: This medicine is only for you. Do not share this medicine with others. What if I miss a dose? It is important not to miss your dose. Call your care team if you are unable to keep an appointment. What may interact with this medication? Do not take this medication with any of the following: Deferasirox Deferoxamine Dimercaprol This medication may also interact with the following: Other iron products This list may not describe all possible interactions. Give your health care provider a list of all the medicines, herbs, non-prescription drugs, or dietary supplements you use. Also tell them if you smoke, drink  alcohol, or use illegal drugs. Some items may interact with your medicine. What should I watch for while using this medication? Your condition will be monitored carefully while you are receiving this medication. Visit your care team for regular checks on your progress. You may need blood work while you are taking this medication. What side effects may I notice from receiving this medication? Side effects that you should report to your care team as soon as possible: Allergic reactions--skin rash, itching, hives, swelling of the face, lips, tongue, or throat Low blood pressure--dizziness, feeling faint or lightheaded, blurry vision Shortness of breath Side effects that usually do not require medical attention (report to your care team if they continue or are bothersome): Flushing Headache Joint pain Muscle pain Nausea Pain, redness, or irritation at injection site This list may not describe all possible side effects. Call your doctor for medical advice about side effects. You may report side effects to FDA at 1-800-FDA-1088. Where should I keep my medication? This medication is given in a hospital or clinic and will not be stored at home. NOTE: This sheet is a summary. It may not cover all possible information. If you have questions about this medicine, talk to your doctor, pharmacist, or health care provider.  2023 Elsevier/Gold Standard (2020-09-25 00:00:00)  

## 2021-09-27 NOTE — Progress Notes (Signed)
Pt declined to stay for 30 minute post observation period. Discussed with patient importance of staying and pt states understanding. VSS, pt ambulated to lobby with walker in stable condition. ?

## 2021-10-06 ENCOUNTER — Other Ambulatory Visit: Payer: Self-pay

## 2021-10-06 DIAGNOSIS — D500 Iron deficiency anemia secondary to blood loss (chronic): Secondary | ICD-10-CM

## 2021-10-07 ENCOUNTER — Encounter: Payer: Self-pay | Admitting: Hematology

## 2021-10-07 ENCOUNTER — Inpatient Hospital Stay: Payer: Medicare Other

## 2021-10-07 ENCOUNTER — Other Ambulatory Visit: Payer: Self-pay

## 2021-10-07 ENCOUNTER — Inpatient Hospital Stay (HOSPITAL_BASED_OUTPATIENT_CLINIC_OR_DEPARTMENT_OTHER): Payer: Medicare Other | Admitting: Hematology

## 2021-10-07 VITALS — BP 127/51 | HR 52 | Temp 98.3°F | Resp 18 | Ht 65.0 in | Wt 179.1 lb

## 2021-10-07 DIAGNOSIS — D500 Iron deficiency anemia secondary to blood loss (chronic): Secondary | ICD-10-CM | POA: Diagnosis not present

## 2021-10-07 DIAGNOSIS — K5520 Angiodysplasia of colon without hemorrhage: Secondary | ICD-10-CM

## 2021-10-07 DIAGNOSIS — Q2733 Arteriovenous malformation of digestive system vessel: Secondary | ICD-10-CM | POA: Diagnosis not present

## 2021-10-07 LAB — CBC WITH DIFFERENTIAL (CANCER CENTER ONLY)
Abs Immature Granulocytes: 0.04 10*3/uL (ref 0.00–0.07)
Basophils Absolute: 0 10*3/uL (ref 0.0–0.1)
Basophils Relative: 0 %
Eosinophils Absolute: 0.1 10*3/uL (ref 0.0–0.5)
Eosinophils Relative: 1 %
HCT: 30.9 % — ABNORMAL LOW (ref 36.0–46.0)
Hemoglobin: 9.2 g/dL — ABNORMAL LOW (ref 12.0–15.0)
Immature Granulocytes: 1 %
Lymphocytes Relative: 17 %
Lymphs Abs: 1.1 10*3/uL (ref 0.7–4.0)
MCH: 23.2 pg — ABNORMAL LOW (ref 26.0–34.0)
MCHC: 29.8 g/dL — ABNORMAL LOW (ref 30.0–36.0)
MCV: 78 fL — ABNORMAL LOW (ref 80.0–100.0)
Monocytes Absolute: 0.7 10*3/uL (ref 0.1–1.0)
Monocytes Relative: 10 %
Neutro Abs: 4.6 10*3/uL (ref 1.7–7.7)
Neutrophils Relative %: 71 %
Platelet Count: 288 10*3/uL (ref 150–400)
RBC: 3.96 MIL/uL (ref 3.87–5.11)
RDW: 19.9 % — ABNORMAL HIGH (ref 11.5–15.5)
WBC Count: 6.6 10*3/uL (ref 4.0–10.5)
nRBC: 0 % (ref 0.0–0.2)

## 2021-10-07 LAB — CMP (CANCER CENTER ONLY)
ALT: 65 U/L — ABNORMAL HIGH (ref 0–44)
AST: 85 U/L — ABNORMAL HIGH (ref 15–41)
Albumin: 3.8 g/dL (ref 3.5–5.0)
Alkaline Phosphatase: 387 U/L — ABNORMAL HIGH (ref 38–126)
Anion gap: 4 — ABNORMAL LOW (ref 5–15)
BUN: 22 mg/dL (ref 8–23)
CO2: 28 mmol/L (ref 22–32)
Calcium: 9.8 mg/dL (ref 8.9–10.3)
Chloride: 103 mmol/L (ref 98–111)
Creatinine: 1.54 mg/dL — ABNORMAL HIGH (ref 0.44–1.00)
GFR, Estimated: 36 mL/min — ABNORMAL LOW (ref >60)
Glucose, Bld: 187 mg/dL — ABNORMAL HIGH (ref 70–99)
Potassium: 4.6 mmol/L (ref 3.5–5.1)
Sodium: 135 mmol/L (ref 135–145)
Total Bilirubin: 0.3 mg/dL (ref 0.3–1.2)
Total Protein: 6.7 g/dL (ref 6.5–8.1)

## 2021-10-07 LAB — IRON AND IRON BINDING CAPACITY (CC-WL,HP ONLY)
Iron: 59 ug/dL (ref 28–170)
Saturation Ratios: 17 % (ref 10.4–31.8)
TIBC: 347 ug/dL (ref 250–450)
UIBC: 288 ug/dL (ref 148–442)

## 2021-10-07 LAB — FERRITIN: Ferritin: 180 ng/mL (ref 11–307)

## 2021-10-07 MED ORDER — SODIUM CHLORIDE 0.9% FLUSH
10.0000 mL | Freq: Once | INTRAVENOUS | Status: AC
  Administered 2021-10-07: 10 mL

## 2021-10-07 MED ORDER — HEPARIN SOD (PORK) LOCK FLUSH 100 UNIT/ML IV SOLN
500.0000 [IU] | Freq: Once | INTRAVENOUS | Status: AC
  Administered 2021-10-07: 500 [IU]

## 2021-10-07 NOTE — Progress Notes (Signed)
[place]   [phone] [phone]   Clinic Follow up Note   Patient Care Team: Arthur Holms, NP as PCP - General (Nurse Practitioner) Audie Box, Cassie Freer, MD as PCP - Cardiology (Cardiology) Truitt Merle, MD as Consulting Physician (Hematology) Angelia Mould, MD as Consulting Physician (Vascular Surgery) Koleen Distance, MD as Referring Physician (Gastroenterology)  Date of Service:  10/07/2021  CHIEF COMPLAINT: f/u of anemia  CURRENT THERAPY:  IV Ferrlecit as needed (if ferritin <100), most recent dose 09/27/21  ASSESSMENT & PLAN:  Bethany [name] is a 71 y.o. female with   1. Iron deficient anemia, secondary to chronic blood loss from GI AVM -Pt has intestinal AVM history with associated chronic GI blood loss resulting in iron deficiency and blood loss anemia. She has required blood transfusions in the past in 2013 and in 2018 and frequent hospital admission for anemia. She does not meet the diagnosis criteria for hereditary hemorrhagic telangiectasia. -Her last colonoscopy/endoscopy was 02/2019, recall 2025.  -She is not on oral iron, due to the lack of benefit.  -Currently being treated with IV Iron to prevent significant anemia with goal of Ferritin 100-200 range. -Her anemia and iron deficiency initially improved since she stopped her Plavix, last IV iron on 12/02/20. However, she required IV Venofer again starting in mid 04/2021. She is now receiving IV Ferrlecit since 07/2021, last 09/27/21. -labs reviewed, her hgb is 9.2 today. We will await her ferritin level to determine if she needs additional IV iron.   2. Multiple lung nodules, Current Smoker with COPD/Emphysema -She has been smoking intermittently for 40 years. Then increased smoking daily after her mother passed in 2021.  -nodules initially seen on screening chest CT 07/12/19.  -Repeat 07/13/20 showed new nodules. PET scan on 07/28/20 showed mild hypermetabolism to dominant LUL  nodule. Biopsy of left lung was attempted by but held given difficult location. -she is now followed by Dr. Lamonte Sakai. Most recent CT 05/18/21 shows waxing and waning of pulmonary nodules. Next scheduled 11/17/21. -She continues to smoke. I have previously discussed complete smoking cessation.    3. Intraductal papilloma, new breast pain -Pt notes h/o benign right breast fibroadenoma, biopsied in 2014, stable on 05/2017 mammogram. -PET 07/28/20 showed nonspecific RIGHT breast mass with low level FDG uptake  -biopsy on 08/25/20 showed ductal papilloma with usual ductal hyperplasia -right lumpectomy on 01/25/21 with Dr. Ninfa Linden showed intraductal papilloma, partially fibrotic, and complex sclerosing lesion. -she had new right breast pain in 06/2021. She was found to have an infection, treated with antibiotics. Mostly resolved now.   4. Chronic GI Bleeding, intermittent epistaxis, constipation, N&V -Has intestinal AVM history -most recent colonoscopy on 03/06/19 under Dr. Alan Ripper showed mild diverticulosis and small internal hemorrhoids, all benign with no obvious signs of bleeding at that time. Pathology showed two tubular adenomas, recall due 2025. -She does not meet the diagnosis criteria for hereditary hemorrhagic telangiectasia (HHT)     5. H/o of arterial thrombosis, Ischemic stroke 11/2019, Left Brain Aneurysm (41m and 211m -S/p unilateral nephrectomy due to damage from arterial thrombosis -Managed by vascular physician Dr. DiDoren Custardnd neurologist Dr SeLeonie Man-Previous work-up for lupus anticoagulant and antiphospholipid syndrome were negative, normal protein S and C level. -On 02/06/20 Dr DeAllegra Granaound a second brain aneurysm. She underwent embolization surgery on 05/04/20  -she still has some mild speech deficit (difficulty finding her words) -most recent MRI/MRA head on 06/02/21 was negative/stable.   6. COPD, HTN, CKD stage III,  DM with Peripheral Neuropathy, Chronic Back Pain -She is currently on  662m Gabapentin 4 times a day.  -[facility]     PLAN: -IV Venofer or Ferrlecit if ferritin <100 -lab monthly x3 for anemia f/u  -f/u in 3 months   No problem-specific Assessment & Plan notes found for this encounter.   INTERVAL HISTORY:  Bethany SCHRUMis here for a follow up of anemia. She was last seen by me on 06/23/21. She presents to the clinic alone. She reports she is stable overall. She reports her bowel movements fluctuate.  He denies any rectal bleeding or melena.   All other systems were reviewed with the patient and are negative.  MEDICAL HISTORY:  Past Medical History:  Diagnosis Date   Acute renal failure (HCC)    Anemia    Arthritis    COPD (chronic obstructive pulmonary disease) (HCC)    Diabetes mellitus without complication (HCC)    type II    GERD (gastroesophageal reflux disease)    Hypercalcemia    Hyperlipidemia    Hypertension    Pneumonia    Renal disorder    Single kidney    Stroke ([facility]    July 2021   Thrombosis    Tobacco abuse     SURGICAL HISTORY: Past Surgical History:  Procedure Laterality Date   ABDOMINAL HYSTERECTOMY     blood clots removed from descending aorta      BREAST BIOPSY Right 07/28/2021   BREAST BIOPSY Right 08/17/2021   BREAST LUMPECTOMY Right 01/25/2021   BREAST LUMPECTOMY WITH RADIOACTIVE SEED LOCALIZATION Right 01/25/2021   Procedure: RIGHT BREAST LUMPECTOMY WITH RADIOACTIVE SEED LOCALIZATION;  Surgeon: BCoralie Keens MD;  [place]  Service: General;  Laterality: Right;   CHOLECYSTECTOMY     COLONOSCOPY     COLONOSCOPY WITH PROPOFOL N/A 04/17/2017   Procedure: COLONOSCOPY WITH PROPOFOL;  Surgeon: SWilford Corner MD;  Location: [facility];  Service: Endoscopy;  Laterality: N/A;   ESOPHAGOGASTRODUODENOSCOPY (EGD) WITH PROPOFOL N/A 04/17/2017   Procedure: ESOPHAGOGASTRODUODENOSCOPY (EGD) WITH  PROPOFOL;  Surgeon: SWilford Corner MD;  Location: [facility];  Service: Endoscopy;  Laterality: N/A;   IR 3D INDEPENDENT WKST  05/04/2020   IR ANGIO INTRA EXTRACRAN SEL COM CAROTID INNOMINATE BILAT MOD SED  02/06/2020   IR ANGIO INTRA EXTRACRAN SEL INTERNAL CAROTID UNI L MOD SED  05/04/2020   IR ANGIO VERTEBRAL SEL VERTEBRAL BILAT MOD SED  02/06/2020   IR ANGIOGRAM FOLLOW UP STUDY  05/04/2020   IR IMAGING GUIDED PORT INSERTION  05/01/2019   IR NEURO EACH ADD'L AFTER BASIC UNI LEFT (MS)  05/04/2020   IR RADIOLOGIST EVAL & MGMT  05/28/2020   IR TRANSCATH/EMBOLIZ  05/04/2020   IR [place] ACCESS RIGHT  02/06/2020   IR [place] VASC ACCESS RIGHT  05/04/2020   LESION REMOVAL Left 01/25/2021   Procedure: ABNORMAL SKIN LESION REMOVAL LEFT ABDOMINAL WALL;  Surgeon: BCoralie Keens MD;  [place] [facility]  Service: General;  Laterality: Left;   NEPHRECTOMY RECIPIENT     ORIF ANKLE FRACTURE N/A 07/13/2021   Procedure: OPEN REDUCTION INTERNAL FIXATION (ORIF) ANKLE FRACTURE;  Surgeon: LGeorgeanna Harrison MD;  Location: [facility];  Service: Orthopedics;  Laterality: N/A;   RADIOLOGY WITH ANESTHESIA N/A 05/04/2020   Procedure: ESheppard Plumber  Surgeon: DLuanne Bras MD;  [place] [facility]  Service: Radiology;  Laterality: N/A;  I have reviewed the social history and family history with the patient and they are unchanged from previous note.  ALLERGIES:  is allergic to contrast media [iodinated contrast media] and other.  MEDICATIONS:  Current Outpatient Medications  Medication Sig Dispense Refill   allopurinol (ZYLOPRIM) 100 MG tablet Take 100 mg by mouth at bedtime.     amLODipine (NORVASC) 10 MG tablet Take 10 mg by mouth daily.     aspirin EC 81 MG EC tablet Take 1 tablet (81 mg total) by mouth daily. Swallow whole. 30 tablet 11   atorvastatin (LIPITOR) 40 MG tablet Take 40 mg by mouth daily.     baclofen (LIORESAL) 10 MG tablet Take 10 mg by mouth 3 (three) times  daily as needed for muscle spasms.     Dulaglutide (TRULICITY) 1.5 YI/0.1KP SOPN Inject 1.5 mg into the skin every Monday.      ezetimibe (ZETIA) 10 MG tablet Take 10 mg by mouth daily.     famotidine (PEPCID) 20 MG tablet Take 20 mg by mouth 2 (two) times daily.     feeding supplement, ENSURE ENLIVE, (ENSURE ENLIVE) LIQD Take 237 mLs by mouth 2 (two) times daily between meals. 237 mL 12   furosemide (LASIX) 40 MG tablet Take 40 mg by mouth daily as needed for fluid or edema.     gabapentin (NEURONTIN) 100 MG capsule Take 2 capsules (200 mg total) by mouth 4 (four) times daily. 240 capsule 3   insulin glargine, 1 Unit Dial, (TOUJEO SOLOSTAR) 300 UNIT/ML Solostar Pen Inject 20 Units into the skin at bedtime.      insulin lispro (HUMALOG) 100 UNIT/ML KwikPen Inject 5 Units into the skin 3 (three) times daily as needed (blood sugar >200).     Ipratropium-Albuterol (COMBIVENT) 20-100 MCG/ACT AERS respimat Inhale 1 puff into the lungs every 6 (six) hours as needed for wheezing or shortness of breath. 1 Inhaler 0   levothyroxine (SYNTHROID) 75 MCG tablet Take 75 mcg by mouth daily.     lidocaine-prilocaine (EMLA) cream Apply 1 application topically as needed. (Patient taking differently: Apply 1 application topically daily as needed (port site access).) 30 g 1   losartan (COZAAR) 25 MG tablet Take 1 tablet (25 mg total) by mouth daily. 30 tablet 11   metoprolol (LOPRESSOR) 50 MG tablet Take 50 mg by mouth 2 (two) times daily.      NARCAN 4 MG/0.1ML LIQD nasal spray kit Place 1 spray into the nose as needed for opioid reversal.     Omega-3 Fatty Acids (FISH OIL PO) Take 1 capsule by mouth 2 (two) times daily.     pantoprazole (PROTONIX) 40 MG tablet Take 40 mg by mouth every morning.     polyethylene glycol powder (MIRALAX) 17 GM/SCOOP powder Take 17 g by mouth 2 (two) times daily as needed for moderate constipation. 255 g 0   senna-docusate (SENOKOT-S) 8.6-50 MG tablet Take 1 tablet by mouth 2 (two)  times daily between meals as needed for mild constipation. 60 tablet 0   Tetrahydrozoline HCl (VISINE OP) Place 1 drop into both eyes 3 (three) times daily as needed (dry eyes).     No current facility-administered medications for this visit.    PHYSICAL EXAMINATION: ECOG PERFORMANCE STATUS: 1 - Symptomatic but completely ambulatory  Vitals:   10/07/21 1307  BP: (!) 127/51  Pulse: (!) 52  Resp: 18  Temp: 98.3 F (36.8 C)  SpO2: 97%   Wt Readings from Last 3 Encounters:  10/07/21 179 lb 1.6 oz (81.2 kg)  09/20/21 169 lb 12.8 oz (77 kg)  09/13/21 181 lb 6.4 oz (82.3 kg)     GENERAL:alert, no distress and comfortable SKIN: skin color normal, no rashes or significant lesions EYES: normal, Conjunctiva are pink and non-injected, sclera clear  NEURO: alert & oriented x 3 with fluent speech  LABORATORY DATA:  I have reviewed the data as listed    Latest Ref Rng & Units 10/07/2021   12:34 PM 08/18/2021   12:43 PM 07/21/2021   11:54 AM  CBC  WBC 4.0 - 10.5 K/uL 6.6   6.9   9.3    Hemoglobin 12.0 - 15.0 g/dL 9.2   8.8   9.1    Hematocrit 36.0 - 46.0 % 30.9   31.0   29.7    Platelets 150 - 400 K/uL 288   261   411          Latest Ref Rng & Units 10/07/2021   12:34 PM 08/18/2021   12:43 PM 07/21/2021   11:54 AM  CMP  Glucose 70 - 99 mg/dL 187   147   226    BUN 8 - 23 mg/dL _0 Creatinine 0.44 - 1.00 mg/dL 1.54   1.35   1.22    Sodium 135 - 145 mmol/L 135   137   137    Potassium 3.5 - 5.1 mmol/L 4.6   4.7   4.6    Chloride 98 - 111 mmol/L 103   103   103    CO2 22 - 32 mmol/L _1 Calcium 8.9 - 10.3 mg/dL 9.8   10.8   10.9    Total Protein 6.5 - 8.1 g/dL 6.7   7.0   7.1    Total Bilirubin 0.3 - 1.2 mg/dL 0.3   0.3   0.3    Alkaline Phos 38 - 126 U/L 387   315   296    AST 15 - 41 U/L 85   139   40    ALT 0 - 44 U/L 65   142   81        RADIOGRAPHIC STUDIES: I have personally reviewed the radiological images as listed and agreed with the findings in  the report. No results found.    No orders of the defined types were placed in this encounter.  All questions were answered. The patient knows to call the clinic with any problems, questions or concerns. No barriers to learning was detected. The total time spent in the appointment was 30 minutes.     Truitt Merle, MD 10/07/2021   I, Wilburn Mylar, am acting as scribe for Truitt Merle, MD.   I have reviewed the above documentation for accuracy and completeness, and I agree with the above.

## 2021-10-22 ENCOUNTER — Telehealth: Payer: Self-pay | Admitting: Hematology

## 2021-10-22 NOTE — Telephone Encounter (Signed)
.  Called patient to schedule appointment per 5/26 los, patient is aware of date and time.

## 2021-11-03 ENCOUNTER — Other Ambulatory Visit: Payer: Self-pay

## 2021-11-03 ENCOUNTER — Inpatient Hospital Stay: Payer: Medicare Other | Attending: Hematology

## 2021-11-03 DIAGNOSIS — Q2733 Arteriovenous malformation of digestive system vessel: Secondary | ICD-10-CM | POA: Insufficient documentation

## 2021-11-03 DIAGNOSIS — D500 Iron deficiency anemia secondary to blood loss (chronic): Secondary | ICD-10-CM | POA: Insufficient documentation

## 2021-11-03 DIAGNOSIS — Z452 Encounter for adjustment and management of vascular access device: Secondary | ICD-10-CM | POA: Diagnosis not present

## 2021-11-03 DIAGNOSIS — K5520 Angiodysplasia of colon without hemorrhage: Secondary | ICD-10-CM

## 2021-11-03 DIAGNOSIS — Z95828 Presence of other vascular implants and grafts: Secondary | ICD-10-CM

## 2021-11-03 LAB — CBC WITH DIFFERENTIAL (CANCER CENTER ONLY)
Abs Immature Granulocytes: 0.01 10*3/uL (ref 0.00–0.07)
Basophils Absolute: 0 10*3/uL (ref 0.0–0.1)
Basophils Relative: 0 %
Eosinophils Absolute: 0.2 10*3/uL (ref 0.0–0.5)
Eosinophils Relative: 2 %
HCT: 32.5 % — ABNORMAL LOW (ref 36.0–46.0)
Hemoglobin: 9.5 g/dL — ABNORMAL LOW (ref 12.0–15.0)
Immature Granulocytes: 0 %
Lymphocytes Relative: 19 %
Lymphs Abs: 1.5 10*3/uL (ref 0.7–4.0)
MCH: 22.7 pg — ABNORMAL LOW (ref 26.0–34.0)
MCHC: 29.2 g/dL — ABNORMAL LOW (ref 30.0–36.0)
MCV: 77.8 fL — ABNORMAL LOW (ref 80.0–100.0)
Monocytes Absolute: 0.8 10*3/uL (ref 0.1–1.0)
Monocytes Relative: 10 %
Neutro Abs: 5.3 10*3/uL (ref 1.7–7.7)
Neutrophils Relative %: 69 %
Platelet Count: 323 10*3/uL (ref 150–400)
RBC: 4.18 MIL/uL (ref 3.87–5.11)
RDW: 19.6 % — ABNORMAL HIGH (ref 11.5–15.5)
WBC Count: 7.8 10*3/uL (ref 4.0–10.5)
nRBC: 0 % (ref 0.0–0.2)

## 2021-11-03 LAB — CMP (CANCER CENTER ONLY)
ALT: 15 U/L (ref 0–44)
AST: 17 U/L (ref 15–41)
Albumin: 3.9 g/dL (ref 3.5–5.0)
Alkaline Phosphatase: 224 U/L — ABNORMAL HIGH (ref 38–126)
Anion gap: 6 (ref 5–15)
BUN: 17 mg/dL (ref 8–23)
CO2: 27 mmol/L (ref 22–32)
Calcium: 10.2 mg/dL (ref 8.9–10.3)
Chloride: 105 mmol/L (ref 98–111)
Creatinine: 1.49 mg/dL — ABNORMAL HIGH (ref 0.44–1.00)
GFR, Estimated: 37 mL/min — ABNORMAL LOW (ref >60)
Glucose, Bld: 140 mg/dL — ABNORMAL HIGH (ref 70–99)
Potassium: 4.1 mmol/L (ref 3.5–5.1)
Sodium: 138 mmol/L (ref 135–145)
Total Bilirubin: 0.3 mg/dL (ref 0.3–1.2)
Total Protein: 7.1 g/dL (ref 6.5–8.1)

## 2021-11-03 LAB — IRON AND IRON BINDING CAPACITY (CC-WL,HP ONLY)
Iron: 15 ug/dL — ABNORMAL LOW (ref 28–170)
Saturation Ratios: 4 % — ABNORMAL LOW (ref 10.4–31.8)
TIBC: 403 ug/dL (ref 250–450)
UIBC: 388 ug/dL (ref 148–442)

## 2021-11-03 LAB — FERRITIN: Ferritin: 28 ng/mL (ref 11–307)

## 2021-11-03 MED ORDER — HEPARIN SOD (PORK) LOCK FLUSH 100 UNIT/ML IV SOLN
500.0000 [IU] | Freq: Once | INTRAVENOUS | Status: AC
  Administered 2021-11-03: 500 [IU]

## 2021-11-03 MED ORDER — LIDOCAINE-PRILOCAINE 2.5-2.5 % EX CREA: 1.0000 | TOPICAL_CREAM | CUTANEOUS | 1 refills | Status: DC | PRN

## 2021-11-03 MED ORDER — SODIUM CHLORIDE 0.9% FLUSH
10.0000 mL | Freq: Once | INTRAVENOUS | Status: AC
  Administered 2021-11-03: 10 mL

## 2021-11-04 ENCOUNTER — Other Ambulatory Visit: Payer: Self-pay

## 2021-11-04 ENCOUNTER — Telehealth: Payer: Self-pay

## 2021-11-04 ENCOUNTER — Inpatient Hospital Stay: Payer: Medicare Other

## 2021-11-04 NOTE — Telephone Encounter (Signed)
Sent scheduling message for 4 doses of IV Ferrlecit infusions and f/u flush lab appt 4 wks post last Ferrlecit infusion.

## 2021-11-12 ENCOUNTER — Other Ambulatory Visit: Payer: Self-pay

## 2021-11-12 ENCOUNTER — Inpatient Hospital Stay: Payer: Medicare Other

## 2021-11-12 VITALS — BP 120/85 | HR 60 | Temp 98.9°F | Resp 16

## 2021-11-12 DIAGNOSIS — K5520 Angiodysplasia of colon without hemorrhage: Secondary | ICD-10-CM

## 2021-11-12 DIAGNOSIS — D500 Iron deficiency anemia secondary to blood loss (chronic): Secondary | ICD-10-CM

## 2021-11-12 DIAGNOSIS — Q2733 Arteriovenous malformation of digestive system vessel: Secondary | ICD-10-CM | POA: Diagnosis not present

## 2021-11-12 MED ORDER — DIPHENHYDRAMINE HCL 50 MG/ML IJ SOLN
25.0000 mg | Freq: Once | INTRAMUSCULAR | Status: AC
  Administered 2021-11-12: 25 mg via INTRAVENOUS
  Filled 2021-11-12: qty 1

## 2021-11-12 MED ORDER — ACETAMINOPHEN 325 MG PO TABS
650.0000 mg | ORAL_TABLET | Freq: Once | ORAL | Status: AC
  Administered 2021-11-12: 650 mg via ORAL
  Filled 2021-11-12: qty 2

## 2021-11-12 MED ORDER — HEPARIN SOD (PORK) LOCK FLUSH 100 UNIT/ML IV SOLN
500.0000 [IU] | Freq: Once | INTRAVENOUS | Status: AC
  Administered 2021-11-12: 500 [IU]

## 2021-11-12 MED ORDER — SODIUM CHLORIDE 0.9% FLUSH
10.0000 mL | Freq: Once | INTRAVENOUS | Status: AC
  Administered 2021-11-12: 10 mL

## 2021-11-12 MED ORDER — SODIUM CHLORIDE 0.9 % IV SOLN
250.0000 mg | INTRAVENOUS | Status: DC
  Administered 2021-11-12: 250 mg via INTRAVENOUS
  Filled 2021-11-12: qty 20

## 2021-11-12 MED ORDER — SODIUM CHLORIDE 0.9 % IV SOLN: Freq: Once | INTRAVENOUS | Status: AC

## 2021-11-12 NOTE — Progress Notes (Signed)
Patient declined to stay for 30 minute post-observation period following administration of iron infusion. Vital signs retaken and remained stable. Patient showed no signs of distress upon discharge.

## 2021-11-12 NOTE — Patient Instructions (Signed)
Sodium Ferric Gluconate Complex Injection What is this medication? SODIUM FERRIC GLUCONATE COMPLEX (SOE dee um FER ik GLOO koe nate KOM pleks) treats low levels of iron (iron deficiency anemia) in people with kidney disease. Iron is a mineral that plays an important role in making red blood cells, which carry oxygen from your lungs to the rest of your body. This medicine may be used for other purposes; ask your health care provider or pharmacist if you have questions. COMMON BRAND NAME(S): Ferrlecit, Nulecit What should I tell my care team before I take this medication? They need to know if you have any of the following conditions: Anemia that is not from iron deficiency High levels of iron in the blood An unusual or allergic reaction to iron, other medications, foods, dyes, or preservatives Pregnant or are trying to become pregnant Breast-feeding How should I use this medication? This medication is injected into a vein. It is given by your care team in a hospital or clinic setting. Talk to your care team about the use of this medication in children. While it may be prescribed for children as young as 6 years for selected conditions, precautions do apply. Overdosage: If you think you have taken too much of this medicine contact a poison control center or emergency room at once. NOTE: This medicine is only for you. Do not share this medicine with others. What if I miss a dose? It is important not to miss your dose. Call your care team if you are unable to keep an appointment. What may interact with this medication? Do not take this medication with any of the following: Deferasirox Deferoxamine Dimercaprol This medication may also interact with the following: Other iron products This list may not describe all possible interactions. Give your health care provider a list of all the medicines, herbs, non-prescription drugs, or dietary supplements you use. Also tell them if you smoke, drink  alcohol, or use illegal drugs. Some items may interact with your medicine. What should I watch for while using this medication? Your condition will be monitored carefully while you are receiving this medication. Visit your care team for regular checks on your progress. You may need blood work while you are taking this medication. What side effects may I notice from receiving this medication? Side effects that you should report to your care team as soon as possible: Allergic reactions--skin rash, itching, hives, swelling of the face, lips, tongue, or throat Low blood pressure--dizziness, feeling faint or lightheaded, blurry vision Shortness of breath Side effects that usually do not require medical attention (report to your care team if they continue or are bothersome): Flushing Headache Joint pain Muscle pain Nausea Pain, redness, or irritation at injection site This list may not describe all possible side effects. Call your doctor for medical advice about side effects. You may report side effects to FDA at 1-800-FDA-1088. Where should I keep my medication? This medication is given in a hospital or clinic and will not be stored at home. NOTE: This sheet is a summary. It may not cover all possible information. If you have questions about this medicine, talk to your doctor, pharmacist, or health care provider.  2023 Elsevier/Gold Standard (2020-09-25 00:00:00)  

## 2021-11-17 ENCOUNTER — Inpatient Hospital Stay: Admission: RE | Admit: 2021-11-17 | Payer: Medicare Other | Source: Ambulatory Visit

## 2021-11-19 ENCOUNTER — Other Ambulatory Visit: Payer: Self-pay

## 2021-11-19 ENCOUNTER — Inpatient Hospital Stay: Payer: Medicare Other | Attending: Hematology

## 2021-11-19 VITALS — BP 144/58 | HR 63 | Temp 98.6°F | Resp 18

## 2021-11-19 DIAGNOSIS — K922 Gastrointestinal hemorrhage, unspecified: Secondary | ICD-10-CM | POA: Insufficient documentation

## 2021-11-19 DIAGNOSIS — R911 Solitary pulmonary nodule: Secondary | ICD-10-CM | POA: Insufficient documentation

## 2021-11-19 DIAGNOSIS — D500 Iron deficiency anemia secondary to blood loss (chronic): Secondary | ICD-10-CM | POA: Insufficient documentation

## 2021-11-19 DIAGNOSIS — F17210 Nicotine dependence, cigarettes, uncomplicated: Secondary | ICD-10-CM | POA: Insufficient documentation

## 2021-11-19 DIAGNOSIS — J449 Chronic obstructive pulmonary disease, unspecified: Secondary | ICD-10-CM | POA: Diagnosis not present

## 2021-11-19 DIAGNOSIS — Z79899 Other long term (current) drug therapy: Secondary | ICD-10-CM | POA: Insufficient documentation

## 2021-11-19 DIAGNOSIS — Q2733 Arteriovenous malformation of digestive system vessel: Secondary | ICD-10-CM | POA: Insufficient documentation

## 2021-11-19 DIAGNOSIS — K5520 Angiodysplasia of colon without hemorrhage: Secondary | ICD-10-CM

## 2021-11-19 MED ORDER — SODIUM CHLORIDE 0.9 % IV SOLN: Freq: Once | INTRAVENOUS | Status: AC

## 2021-11-19 MED ORDER — DIPHENHYDRAMINE HCL 50 MG/ML IJ SOLN
25.0000 mg | Freq: Once | INTRAMUSCULAR | Status: AC
  Administered 2021-11-19: 25 mg via INTRAVENOUS
  Filled 2021-11-19: qty 1

## 2021-11-19 MED ORDER — SODIUM CHLORIDE 0.9 % IV SOLN
250.0000 mg | INTRAVENOUS | Status: DC
  Administered 2021-11-19: 250 mg via INTRAVENOUS
  Filled 2021-11-19: qty 20

## 2021-11-19 MED ORDER — ACETAMINOPHEN 325 MG PO TABS
650.0000 mg | ORAL_TABLET | Freq: Once | ORAL | Status: AC
  Administered 2021-11-19: 650 mg via ORAL
  Filled 2021-11-19: qty 2

## 2021-11-19 NOTE — Progress Notes (Signed)
Pt tolerated iron infusion well. Advised to call office with any concerns. Pt verbalized understanding

## 2021-11-26 ENCOUNTER — Other Ambulatory Visit: Payer: Self-pay

## 2021-11-26 ENCOUNTER — Inpatient Hospital Stay: Payer: Medicare Other

## 2021-11-26 ENCOUNTER — Ambulatory Visit (HOSPITAL_COMMUNITY)
Admission: RE | Admit: 2021-11-26 | Discharge: 2021-11-26 | Disposition: A | Discharge status: Home / Self Care | Payer: Medicare Other | Source: Ambulatory Visit | Attending: Emergency Medicine | Admitting: Emergency Medicine

## 2021-11-26 VITALS — BP 161/53 | HR 64 | Temp 98.1°F | Resp 16

## 2021-11-26 DIAGNOSIS — R918 Other nonspecific abnormal finding of lung field: Secondary | ICD-10-CM | POA: Diagnosis not present

## 2021-11-26 DIAGNOSIS — D500 Iron deficiency anemia secondary to blood loss (chronic): Secondary | ICD-10-CM

## 2021-11-26 DIAGNOSIS — K5520 Angiodysplasia of colon without hemorrhage: Secondary | ICD-10-CM

## 2021-11-26 MED ORDER — SODIUM CHLORIDE 0.9% FLUSH
10.0000 mL | Freq: Once | INTRAVENOUS | Status: AC
  Administered 2021-11-26: 10 mL

## 2021-11-26 MED ORDER — ACETAMINOPHEN 325 MG PO TABS
650.0000 mg | ORAL_TABLET | Freq: Once | ORAL | Status: AC
  Administered 2021-11-26: 650 mg via ORAL
  Filled 2021-11-26: qty 2

## 2021-11-26 MED ORDER — SODIUM CHLORIDE 0.9 % IV SOLN
250.0000 mg | INTRAVENOUS | Status: DC
  Administered 2021-11-26: 250 mg via INTRAVENOUS
  Filled 2021-11-26: qty 20

## 2021-11-26 MED ORDER — HEPARIN SOD (PORK) LOCK FLUSH 100 UNIT/ML IV SOLN
500.0000 [IU] | Freq: Once | INTRAVENOUS | Status: AC
  Administered 2021-11-26: 500 [IU]

## 2021-11-26 MED ORDER — DIPHENHYDRAMINE HCL 50 MG/ML IJ SOLN
25.0000 mg | Freq: Once | INTRAMUSCULAR | Status: AC
  Administered 2021-11-26: 25 mg via INTRAVENOUS
  Filled 2021-11-26: qty 1

## 2021-11-26 MED ORDER — SODIUM CHLORIDE 0.9 % IV SOLN: Freq: Once | INTRAVENOUS | Status: AC

## 2021-11-26 NOTE — Patient Instructions (Addendum)
Sodium Ferric Gluconate Complex Injection What is this medication? SODIUM FERRIC GLUCONATE COMPLEX (SOE dee um FER ik GLOO koe nate KOM pleks) treats low levels of iron (iron deficiency anemia) in people with kidney disease. Iron is a mineral that plays an important role in making red blood cells, which carry oxygen from your lungs to the rest of your body. This medicine may be used for other purposes; ask your health care provider or pharmacist if you have questions. COMMON BRAND NAME(S): Ferrlecit, Nulecit What should I tell my care team before I take this medication? They need to know if you have any of the following conditions: Anemia that is not from iron deficiency High levels of iron in the blood An unusual or allergic reaction to iron, other medications, foods, dyes, or preservatives Pregnant or are trying to become pregnant Breast-feeding How should I use this medication? This medication is injected into a vein. It is given by your care team in a hospital or clinic setting. Talk to your care team about the use of this medication in children. While it may be prescribed for children as young as 6 years for selected conditions, precautions do apply. Overdosage: If you think you have taken too much of this medicine contact a poison control center or emergency room at once. NOTE: This medicine is only for you. Do not share this medicine with others. What if I miss a dose? It is important not to miss your dose. Call your care team if you are unable to keep an appointment. What may interact with this medication? Do not take this medication with any of the following: Deferasirox Deferoxamine Dimercaprol This medication may also interact with the following: Other iron products This list may not describe all possible interactions. Give your health care provider a list of all the medicines, herbs, non-prescription drugs, or dietary supplements you use. Also tell them if you smoke, drink  alcohol, or use illegal drugs. Some items may interact with your medicine. What should I watch for while using this medication? Your condition will be monitored carefully while you are receiving this medication. Visit your care team for regular checks on your progress. You may need blood work while you are taking this medication. What side effects may I notice from receiving this medication? Side effects that you should report to your care team as soon as possible: Allergic reactions--skin rash, itching, hives, swelling of the face, lips, tongue, or throat Low blood pressure--dizziness, feeling faint or lightheaded, blurry vision Shortness of breath Side effects that usually do not require medical attention (report to your care team if they continue or are bothersome): Flushing Headache Joint pain Muscle pain Nausea Pain, redness, or irritation at injection site This list may not describe all possible side effects. Call your doctor for medical advice about side effects. You may report side effects to FDA at 1-800-FDA-1088. Where should I keep my medication? This medication is given in a hospital or clinic and will not be stored at home. NOTE: This sheet is a summary. It may not cover all possible information. If you have questions about this medicine, talk to your doctor, pharmacist, or health care provider.  2023 Elsevier/Gold Standard (2020-09-25 00:00:00)  

## 2021-12-01 ENCOUNTER — Inpatient Hospital Stay: Payer: Medicare Other

## 2021-12-02 ENCOUNTER — Inpatient Hospital Stay: Payer: Medicare Other

## 2021-12-03 ENCOUNTER — Inpatient Hospital Stay: Payer: Medicare Other

## 2021-12-07 ENCOUNTER — Other Ambulatory Visit: Payer: Self-pay

## 2021-12-07 ENCOUNTER — Inpatient Hospital Stay: Payer: Medicare Other

## 2021-12-07 VITALS — BP 140/65 | HR 59 | Temp 98.9°F | Resp 18

## 2021-12-07 DIAGNOSIS — Q2733 Arteriovenous malformation of digestive system vessel: Secondary | ICD-10-CM | POA: Diagnosis not present

## 2021-12-07 DIAGNOSIS — K5520 Angiodysplasia of colon without hemorrhage: Secondary | ICD-10-CM

## 2021-12-07 DIAGNOSIS — D500 Iron deficiency anemia secondary to blood loss (chronic): Secondary | ICD-10-CM

## 2021-12-07 MED ORDER — SODIUM CHLORIDE 0.9 % IV SOLN: Freq: Once | INTRAVENOUS | Status: AC

## 2021-12-07 MED ORDER — DIPHENHYDRAMINE HCL 50 MG/ML IJ SOLN
25.0000 mg | Freq: Once | INTRAMUSCULAR | Status: AC
  Administered 2021-12-07: 25 mg via INTRAVENOUS
  Filled 2021-12-07: qty 1

## 2021-12-07 MED ORDER — SODIUM CHLORIDE 0.9 % IV SOLN
250.0000 mg | INTRAVENOUS | Status: DC
  Administered 2021-12-07: 250 mg via INTRAVENOUS
  Filled 2021-12-07: qty 20

## 2021-12-07 MED ORDER — ACETAMINOPHEN 325 MG PO TABS
650.0000 mg | ORAL_TABLET | Freq: Once | ORAL | Status: AC
  Administered 2021-12-07: 650 mg via ORAL
  Filled 2021-12-07: qty 2

## 2021-12-07 MED ORDER — SODIUM CHLORIDE 0.9% FLUSH
10.0000 mL | Freq: Once | INTRAVENOUS | Status: AC
  Administered 2021-12-07: 10 mL

## 2021-12-07 MED ORDER — HEPARIN SOD (PORK) LOCK FLUSH 100 UNIT/ML IV SOLN
500.0000 [IU] | Freq: Once | INTRAVENOUS | Status: AC
  Administered 2021-12-07: 500 [IU]

## 2021-12-07 NOTE — Progress Notes (Signed)
Pt declined to stay for post 30 obs. Ambulatory to lobby with VSS upon discharge.

## 2021-12-07 NOTE — Patient Instructions (Signed)
Sodium Ferric Gluconate Complex Injection What is this medication? SODIUM FERRIC GLUCONATE COMPLEX (SOE dee um FER ik GLOO koe nate KOM pleks) treats low levels of iron (iron deficiency anemia) in people with kidney disease. Iron is a mineral that plays an important role in making red blood cells, which carry oxygen from your lungs to the rest of your body. This medicine may be used for other purposes; ask your health care provider or pharmacist if you have questions. COMMON BRAND NAME(S): Ferrlecit, Nulecit What should I tell my care team before I take this medication? They need to know if you have any of the following conditions: Anemia that is not from iron deficiency High levels of iron in the blood An unusual or allergic reaction to iron, other medications, foods, dyes, or preservatives Pregnant or are trying to become pregnant Breast-feeding How should I use this medication? This medication is injected into a vein. It is given by your care team in a hospital or clinic setting. Talk to your care team about the use of this medication in children. While it may be prescribed for children as young as 6 years for selected conditions, precautions do apply. Overdosage: If you think you have taken too much of this medicine contact a poison control center or emergency room at once. NOTE: This medicine is only for you. Do not share this medicine with others. What if I miss a dose? It is important not to miss your dose. Call your care team if you are unable to keep an appointment. What may interact with this medication? Do not take this medication with any of the following: Deferasirox Deferoxamine Dimercaprol This medication may also interact with the following: Other iron products This list may not describe all possible interactions. Give your health care provider a list of all the medicines, herbs, non-prescription drugs, or dietary supplements you use. Also tell them if you smoke, drink  alcohol, or use illegal drugs. Some items may interact with your medicine. What should I watch for while using this medication? Your condition will be monitored carefully while you are receiving this medication. Visit your care team for regular checks on your progress. You may need blood work while you are taking this medication. What side effects may I notice from receiving this medication? Side effects that you should report to your care team as soon as possible: Allergic reactions--skin rash, itching, hives, swelling of the face, lips, tongue, or throat Low blood pressure--dizziness, feeling faint or lightheaded, blurry vision Shortness of breath Side effects that usually do not require medical attention (report to your care team if they continue or are bothersome): Flushing Headache Joint pain Muscle pain Nausea Pain, redness, or irritation at injection site This list may not describe all possible side effects. Call your doctor for medical advice about side effects. You may report side effects to FDA at 1-800-FDA-1088. Where should I keep my medication? This medication is given in a hospital or clinic and will not be stored at home. NOTE: This sheet is a summary. It may not cover all possible information. If you have questions about this medicine, talk to your doctor, pharmacist, or health care provider.  2023 Elsevier/Gold Standard (2020-09-25 00:00:00)  

## 2021-12-20 ENCOUNTER — Other Ambulatory Visit (HOSPITAL_COMMUNITY): Payer: Self-pay | Admitting: Interventional Radiology

## 2021-12-20 DIAGNOSIS — I671 Cerebral aneurysm, nonruptured: Secondary | ICD-10-CM

## 2021-12-21 ENCOUNTER — Encounter: Payer: Self-pay | Admitting: Emergency Medicine

## 2021-12-21 ENCOUNTER — Ambulatory Visit (INDEPENDENT_AMBULATORY_CARE_PROVIDER_SITE_OTHER): Payer: Medicare Other | Admitting: Emergency Medicine

## 2021-12-21 DIAGNOSIS — J449 Chronic obstructive pulmonary disease, unspecified: Secondary | ICD-10-CM | POA: Diagnosis not present

## 2021-12-21 DIAGNOSIS — R918 Other nonspecific abnormal finding of lung field: Secondary | ICD-10-CM

## 2021-12-21 DIAGNOSIS — Z720 Tobacco use: Secondary | ICD-10-CM | POA: Diagnosis not present

## 2021-12-21 NOTE — Patient Instructions (Signed)
We reviewed your CT scan of the chest today. We will plan to repeat your CT chest in 6 months to follow small pulmonary nodules.  This will be done in January 2024. Continue to work on decreasing her smoking.  Ultimate goal will be to stop altogether. Okay for you to keep Combivent available to use 2 puffs up to every 6 hours if needed for shortness of breath, chest tightness, wheezing.  You do not need to take this medication on a schedule. Follow Dr. Lamonte Sakai in 6 months after your CT scan so we can review the results together.

## 2021-12-21 NOTE — Assessment & Plan Note (Signed)
Suspect mild.  Minimal symptoms.  She has Combivent but never uses it.  I think the most important thing for her to do right now is decrease and ultimately stop her cigarettes.  I will perform PFT if she has progressive pulmonary symptoms.

## 2021-12-21 NOTE — Assessment & Plan Note (Signed)
Discussed the importance of cessation with her today

## 2021-12-21 NOTE — Addendum Note (Signed)
Addended by: Gavin Potters R on: 12/21/2021 10:53 AM   Modules accepted: Orders

## 2021-12-21 NOTE — Assessment & Plan Note (Signed)
Reviewed her CT scan.  The punctate nodules have come and gone, suspect consistent with an inflammatory cause.  The lingular groundglass nodule is stable in size and appearance but will need to be followed for 5 years total given risk for possible adenocarcinoma.  We discussed this plan today.  She agrees.  Plan to repeat her CT January 2024

## 2021-12-21 NOTE — Progress Notes (Signed)
Subjective:    Patient ID: Bethany [name], female    DOB: 1951-04-23, 71 y.o.   MRN: [id number]  HPI  ROV 12/21/2021 --Bethany [name] is 71 and has a history of tobacco, mild COPD, diabetes, GERD, hypertension, prior CVA, chronic renal insufficiency.  We have been following a groundglass left upper lobe pulmonary nodule originally noted 04/2021, decreased in size on serial imaging.  Most recent scan with a 1.1 cm left upper lobe groundglass nodule, resolution of some other nodules but with a new 4 mm right upper lobe nodule.  We have been following with serial films. Minimal cough. No dyspnea, good functional capacity. She has combivent but never uses it.   Super D CT chest 11/26/2021 reviewed by me shows see stable 1.1 cm left upper lobe groundglass pulmonary nodule that will need extended follow-up.  Additional pulmonary nodules are stable.  There is a new 5 mm inferior lingular nodule present.   Review of Systems As per [facility]  Past Medical History:  Diagnosis Date   Acute renal failure (HCC)    Anemia    Arthritis    COPD (chronic obstructive pulmonary disease) (HCC)    Diabetes mellitus without complication (HCC)    type II    GERD (gastroesophageal reflux disease)    Hypercalcemia    Hyperlipidemia    Hypertension    Pneumonia    Renal disorder    Single kidney    Stroke [facility])    July 2021   Thrombosis    Tobacco abuse      Family History  Problem Relation Age of Onset   Hypertension Mother    Diabetes Father    Breast cancer Sister    Breast cancer Maternal Aunt        76s   Breast cancer Maternal Aunt 75       colon cancer   Hypertension Brother      Social History   Socioeconomic History   Marital status: Single    Spouse name: Not on file   Number of children: 3   Years of education: Not on file   Highest education level: Not on file  Occupational History   Occupation: retired   Tobacco Use   Smoking status: Every Day    Packs/day: 0.25    Years: 40.00     Total pack years: 10.00    Types: Cigarettes   Smokeless tobacco: Never   Tobacco comments:    8 cigarettes smoked daily ARJ, RN 12/21/21  Vaping Use   Vaping Use: Never used  Substance and Sexual Activity   Alcohol use: Yes    Comment: socially   Drug use: No   Sexual activity: Not on file  Other Topics Concern   Not on file  Social History Narrative   Lives with daughter    Dorie Rank from [place] Determinants of Health   Financial Resource Strain: Not on file  Food Insecurity: Not on file  Transportation Needs: Not on file  Physical Activity: Not on file  Stress: Not on file  Social Connections: Not on file  Intimate Partner Violence: Not on file     Allergies  Allergen Reactions   Contrast Media [Iodinated Contrast Media]     Renal hypertension per physician   Other Other (See Comments)    No ASA/Anti-Coag due to GI bleed     Outpatient Medications Prior to Visit  Medication Sig Dispense Refill   allopurinol (ZYLOPRIM) 100 MG tablet  Take 100 mg by mouth at bedtime.     amLODipine (NORVASC) 10 MG tablet Take 10 mg by mouth daily.     aspirin EC 81 MG EC tablet Take 1 tablet (81 mg total) by mouth daily. Swallow whole. 30 tablet 11   atorvastatin (LIPITOR) 40 MG tablet Take 40 mg by mouth daily.     baclofen (LIORESAL) 10 MG tablet Take 10 mg by mouth 3 (three) times daily as needed for muscle spasms.     Dulaglutide (TRULICITY) 1.5 DJ/5.7SV SOPN Inject 1.5 mg into the skin every Monday.      ezetimibe (ZETIA) 10 MG tablet Take 10 mg by mouth daily.     famotidine (PEPCID) 20 MG tablet Take 20 mg by mouth 2 (two) times daily.     feeding supplement, ENSURE ENLIVE, (ENSURE ENLIVE) LIQD Take 237 mLs by mouth 2 (two) times daily between meals. 237 mL 12   furosemide (LASIX) 40 MG tablet Take 40 mg by mouth daily as needed for fluid or edema.     gabapentin (NEURONTIN) 100 MG capsule Take 2 capsules (200 mg total) by mouth 4 (four) times daily. 240 capsule 3   insulin  glargine, 1 Unit Dial, (TOUJEO SOLOSTAR) 300 UNIT/ML Solostar Pen Inject 20 Units into the skin at bedtime.      Ipratropium-Albuterol (COMBIVENT) 20-100 MCG/ACT AERS respimat Inhale 1 puff into the lungs every 6 (six) hours as needed for wheezing or shortness of breath. 1 Inhaler 0   levothyroxine (SYNTHROID) 75 MCG tablet Take 75 mcg by mouth daily.     lidocaine-prilocaine (EMLA) cream Apply 1 Application topically as needed. Apply 2to5 cm of cream to port site 45 minutes to 1 hour prior to port access. 30 g 1   metoprolol (LOPRESSOR) 50 MG tablet Take 50 mg by mouth 2 (two) times daily.      NARCAN 4 MG/0.1ML LIQD nasal spray kit Place 1 spray into the nose as needed for opioid reversal.     Omega-3 Fatty Acids (FISH OIL PO) Take 1 capsule by mouth 2 (two) times daily.     pantoprazole (PROTONIX) 40 MG tablet Take 40 mg by mouth every morning.     polyethylene glycol powder (MIRALAX) 17 GM/SCOOP powder Take 17 g by mouth 2 (two) times daily as needed for moderate constipation. 255 g 0   senna-docusate (SENOKOT-S) 8.6-50 MG tablet Take 1 tablet by mouth 2 (two) times daily between meals as needed for mild constipation. 60 tablet 0   Tetrahydrozoline HCl (VISINE OP) Place 1 drop into both eyes 3 (three) times daily as needed (dry eyes).     insulin lispro (HUMALOG) 100 UNIT/ML KwikPen Inject 5 Units into the skin 3 (three) times daily as needed (blood sugar >200).     losartan (COZAAR) 25 MG tablet Take 1 tablet (25 mg total) by mouth daily. 30 tablet 11   No facility-administered medications prior to visit.          Objective:   Physical Exam Vitals:   12/21/21 1015  BP: 128/68  Pulse: 91  Temp: 97.8 F (36.6 C)  TempSrc: Oral  SpO2: 93%  Weight: 175 lb 12.8 oz (79.7 kg)  Height: '5\' 5"'  (1.651 m)   Gen: Pleasant, well-nourished, in no distress,  normal affect  ENT: No lesions,  mouth clear,  oropharynx clear, no postnasal drip  Neck: No JVD, no stridor  Lungs: No use of  accessory muscles, no crackles or wheezing on normal respiration, no  wheeze on forced expiration  Cardiovascular: RRR, heart sounds normal, no murmur or gallops, no peripheral edema  Musculoskeletal: No deformities, no cyanosis or clubbing  Neuro: alert, awake, non focal  Skin: Warm, no lesions or rash      Assessment & Plan:  Multiple lung nodules Reviewed her CT scan.  The punctate nodules have come and gone, suspect consistent with an inflammatory cause.  The lingular groundglass nodule is stable in size and appearance but will need to be followed for 5 years total given risk for possible adenocarcinoma.  We discussed this plan today.  She agrees.  Plan to repeat her CT January 2024  COPD (chronic obstructive pulmonary disease) (HCC) Suspect mild.  Minimal symptoms.  She has Combivent but never uses it.  I think the most important thing for her to do right now is decrease and ultimately stop her cigarettes.  I will perform PFT if she has progressive pulmonary symptoms.  Tobacco use Discussed the importance of cessation with her today   Baltazar Apo, MD, PhD 12/21/2021, 10:40 AM [facility] [phone] or if no answer before 7:00PM call [phone] For any issues after 7:00PM please call eLink [phone]

## 2021-12-30 ENCOUNTER — Encounter: Payer: Self-pay | Admitting: Hematology

## 2021-12-30 ENCOUNTER — Inpatient Hospital Stay: Payer: Medicare Other | Attending: Hematology | Admitting: Hematology

## 2021-12-30 ENCOUNTER — Inpatient Hospital Stay: Payer: Medicare Other

## 2021-12-30 ENCOUNTER — Other Ambulatory Visit: Payer: Self-pay

## 2021-12-30 VITALS — BP 142/52 | HR 57 | Temp 98.7°F | Resp 15 | Wt 169.7 lb

## 2021-12-30 DIAGNOSIS — G8929 Other chronic pain: Secondary | ICD-10-CM | POA: Diagnosis not present

## 2021-12-30 DIAGNOSIS — K5520 Angiodysplasia of colon without hemorrhage: Secondary | ICD-10-CM

## 2021-12-30 DIAGNOSIS — Z1231 Encounter for screening mammogram for malignant neoplasm of breast: Secondary | ICD-10-CM | POA: Diagnosis not present

## 2021-12-30 DIAGNOSIS — I129 Hypertensive chronic kidney disease with stage 1 through stage 4 chronic kidney disease, or unspecified chronic kidney disease: Secondary | ICD-10-CM | POA: Diagnosis not present

## 2021-12-30 DIAGNOSIS — D500 Iron deficiency anemia secondary to blood loss (chronic): Secondary | ICD-10-CM | POA: Insufficient documentation

## 2021-12-30 DIAGNOSIS — E1122 Type 2 diabetes mellitus with diabetic chronic kidney disease: Secondary | ICD-10-CM | POA: Insufficient documentation

## 2021-12-30 DIAGNOSIS — Q2733 Arteriovenous malformation of digestive system vessel: Secondary | ICD-10-CM | POA: Insufficient documentation

## 2021-12-30 DIAGNOSIS — M549 Dorsalgia, unspecified: Secondary | ICD-10-CM | POA: Insufficient documentation

## 2021-12-30 DIAGNOSIS — N183 Chronic kidney disease, stage 3 unspecified: Secondary | ICD-10-CM | POA: Diagnosis not present

## 2021-12-30 LAB — CBC WITH DIFFERENTIAL (CANCER CENTER ONLY)
Abs Immature Granulocytes: 0.02 10*3/uL (ref 0.00–0.07)
Basophils Absolute: 0 10*3/uL (ref 0.0–0.1)
Basophils Relative: 1 %
Eosinophils Absolute: 0.1 10*3/uL (ref 0.0–0.5)
Eosinophils Relative: 2 %
HCT: 38.3 % (ref 36.0–46.0)
Hemoglobin: 11.6 g/dL — ABNORMAL LOW (ref 12.0–15.0)
Immature Granulocytes: 0 %
Lymphocytes Relative: 16 %
Lymphs Abs: 1 10*3/uL (ref 0.7–4.0)
MCH: 23.4 pg — ABNORMAL LOW (ref 26.0–34.0)
MCHC: 30.3 g/dL (ref 30.0–36.0)
MCV: 77.4 fL — ABNORMAL LOW (ref 80.0–100.0)
Monocytes Absolute: 0.3 10*3/uL (ref 0.1–1.0)
Monocytes Relative: 5 %
Neutro Abs: 4.7 10*3/uL (ref 1.7–7.7)
Neutrophils Relative %: 76 %
Platelet Count: 239 10*3/uL (ref 150–400)
RBC: 4.95 MIL/uL (ref 3.87–5.11)
RDW: 19.4 % — ABNORMAL HIGH (ref 11.5–15.5)
WBC Count: 6.1 10*3/uL (ref 4.0–10.5)
nRBC: 0 % (ref 0.0–0.2)

## 2021-12-30 LAB — CMP (CANCER CENTER ONLY)
ALT: 72 U/L — ABNORMAL HIGH (ref 0–44)
AST: 34 U/L (ref 15–41)
Albumin: 4.3 g/dL (ref 3.5–5.0)
Alkaline Phosphatase: 272 U/L — ABNORMAL HIGH (ref 38–126)
Anion gap: 4 — ABNORMAL LOW (ref 5–15)
BUN: 20 mg/dL (ref 8–23)
CO2: 26 mmol/L (ref 22–32)
Calcium: 10.4 mg/dL — ABNORMAL HIGH (ref 8.9–10.3)
Chloride: 107 mmol/L (ref 98–111)
Creatinine: 1.12 mg/dL — ABNORMAL HIGH (ref 0.44–1.00)
GFR, Estimated: 53 mL/min — ABNORMAL LOW (ref >60)
Glucose, Bld: 158 mg/dL — ABNORMAL HIGH (ref 70–99)
Potassium: 4.2 mmol/L (ref 3.5–5.1)
Sodium: 137 mmol/L (ref 135–145)
Total Bilirubin: 0.3 mg/dL (ref 0.3–1.2)
Total Protein: 7.2 g/dL (ref 6.5–8.1)

## 2021-12-30 LAB — IRON AND IRON BINDING CAPACITY (CC-WL,HP ONLY)
Iron: 51 ug/dL (ref 28–170)
Saturation Ratios: 14 % (ref 10.4–31.8)
TIBC: 363 ug/dL (ref 250–450)
UIBC: 312 ug/dL (ref 148–442)

## 2021-12-30 LAB — FERRITIN: Ferritin: 116 ng/mL (ref 11–307)

## 2021-12-30 MED ORDER — HEPARIN SOD (PORK) LOCK FLUSH 100 UNIT/ML IV SOLN
500.0000 [IU] | Freq: Once | INTRAVENOUS | Status: AC
  Administered 2021-12-30: 500 [IU]

## 2021-12-30 MED ORDER — SODIUM CHLORIDE 0.9% FLUSH
10.0000 mL | Freq: Once | INTRAVENOUS | Status: AC
  Administered 2021-12-30: 10 mL

## 2021-12-30 NOTE — Progress Notes (Signed)
[place]   [phone] [phone]   Clinic Follow up Note   Patient Care Team: Arthur Holms, NP as PCP - General (Nurse Practitioner) Audie Box, Cassie Freer, MD as PCP - Cardiology (Cardiology) Truitt Merle, MD as Consulting Physician (Hematology) Angelia Mould, MD as Consulting Physician (Vascular Surgery) Koleen Distance, MD as Referring Physician (Gastroenterology)  Date of Service:  12/30/2021  CHIEF COMPLAINT: f/u of anemia  CURRENT THERAPY:  IV iron as needed (if ferritin <100), most recent IV Ferrlecit dose 12/07/21  ASSESSMENT & PLAN:  Bethany [name] is a 71 y.o. female with   1. Iron deficient anemia, secondary to chronic blood loss from GI AVM -Pt has intestinal AVM history with associated chronic GI blood loss resulting in iron deficiency and blood loss anemia. She has required blood transfusions in the past in 2013 and in 2018 and frequent hospital admission for anemia. She does not meet the diagnosis criteria for hereditary hemorrhagic telangiectasia. -Her last colonoscopy/endoscopy was 02/2019, recall 2025.  -She is not on oral iron, due to the lack of benefit.  -Currently being treated with IV Iron to prevent significant anemia with goal of Ferritin 100-200 range. Last was IV Ferrlecit on 12/07/21. -labs reviewed, her hgb is up to 11.6 today. We will await her ferritin level to determine if she needs additional IV iron.   2. Multiple lung nodules, Current Smoker with COPD/Emphysema -h/o smoking intermittently for 40 years, then increased to daily after her mother passed in 2021.  -nodules initially seen on screening chest CT 07/12/19.  -Repeat 07/13/20 showed new nodules. PET scan on 07/28/20 showed mild hypermetabolism to dominant LUL nodule. Biopsy of left lung was attempted but held given difficult location. -she is now followed by Dr. Lamonte Sakai. Most recent CT 11/26/21 shows waxing and waning of pulmonary nodules. Next scheduled  05/26/22. -She continues to smoke. I have previously discussed complete smoking cessation.    3. Intraductal papilloma -Pt notes h/o benign right breast fibroadenoma, biopsied in 2014, stable on 05/2017 mammogram. -PET 07/28/20 showed nonspecific RIGHT breast mass with low level FDG uptake  -biopsy on 08/25/20 showed ductal papilloma with usual ductal hyperplasia -right lumpectomy on 01/25/21 with Dr. Ninfa Linden showed intraductal papilloma, partially fibrotic, and complex sclerosing lesion. -she is overdue for left mammogram, last right was 07/28/21 for abscess. I ordered for her today.   4. Chronic GI Bleeding, intermittent epistaxis, constipation, N&V -Has intestinal AVM history -most recent colonoscopy on 03/06/19 under Dr. Alan Ripper showed mild diverticulosis and small internal hemorrhoids, all benign with no obvious signs of bleeding at that time. Pathology showed two tubular adenomas, recall due 2025. -She does not meet the diagnosis criteria for hereditary hemorrhagic telangiectasia (HHT)     5. H/o of arterial thrombosis, Ischemic stroke 11/2019, Left Brain Aneurysm (17m and 226m -S/p unilateral nephrectomy due to damage from arterial thrombosis -Previous work-up for lupus anticoagulant and antiphospholipid syndrome were negative, normal protein S and C level. -s/p brain aneurysm embolization surgery on 05/04/20  -she still has some mild speech deficit (difficulty finding her words) -most recent MRI/MRA head on 06/02/21 was negative/stable. Next on 01/25/22.   6. COPD, HTN, CKD stage III, DM with Peripheral Neuropathy, Chronic Back Pain -She is currently on 60067mabapentin 4 times a day.  -[facility] f[facility] and long acting [facility]one.     PLAN: -IV Ferrlecit if ferritin <100 -lab monthly x4 for anemia f/u  -f/u in  4 months   No problem-specific Assessment & Plan notes found for this encounter.   INTERVAL HISTORY:  Bethany [name] is here  for a follow up of anemia. She was last seen by me on 10/07/21. She presents to the clinic alone. She reports she is doing well overall, no new concerns.   All other systems were reviewed with the patient and are negative.  MEDICAL HISTORY:  Past Medical History:  Diagnosis Date   Acute renal failure (HCC)    Anemia    Arthritis    COPD (chronic obstructive pulmonary disease) (HCC)    Diabetes mellitus without complication (HCC)    type II    GERD (gastroesophageal reflux disease)    Hypercalcemia    Hyperlipidemia    Hypertension    Pneumonia    Renal disorder    Single kidney    Stroke [facility])    July 2021   Thrombosis    Tobacco abuse     SURGICAL HISTORY: Past Surgical History:  Procedure Laterality Date   ABDOMINAL HYSTERECTOMY     blood clots removed from descending aorta      BREAST BIOPSY Right 07/28/2021   BREAST BIOPSY Right 08/17/2021   BREAST LUMPECTOMY Right 01/25/2021   BREAST LUMPECTOMY WITH RADIOACTIVE SEED LOCALIZATION Right 01/25/2021   Procedure: RIGHT BREAST LUMPECTOMY WITH RADIOACTIVE SEED LOCALIZATION;  Surgeon: Coralie Keens, MD;  Location: [place];  Service: General;  Laterality: Right;   CHOLECYSTECTOMY     COLONOSCOPY     COLONOSCOPY WITH PROPOFOL N/A 04/17/2017   Procedure: COLONOSCOPY WITH PROPOFOL;  Surgeon: Wilford Corner, MD;  Location: [facility];  Service: Endoscopy;  Laterality: N/A;   ESOPHAGOGASTRODUODENOSCOPY (EGD) WITH PROPOFOL N/A 04/17/2017   Procedure: ESOPHAGOGASTRODUODENOSCOPY (EGD) WITH PROPOFOL;  Surgeon: Wilford Corner, MD;  Location: [facility];  Service: Endoscopy;  Laterality: N/A;   IR 3D INDEPENDENT WKST  05/04/2020   IR ANGIO INTRA EXTRACRAN SEL COM CAROTID INNOMINATE BILAT MOD SED  02/06/2020   IR ANGIO INTRA EXTRACRAN SEL INTERNAL CAROTID UNI L MOD SED  05/04/2020   IR ANGIO VERTEBRAL SEL VERTEBRAL BILAT MOD SED  02/06/2020   IR ANGIOGRAM FOLLOW UP STUDY  05/04/2020   IR IMAGING GUIDED  PORT INSERTION  05/01/2019   IR NEURO EACH ADD'L AFTER BASIC UNI LEFT (MS)  05/04/2020   IR RADIOLOGIST EVAL & MGMT  05/28/2020   IR TRANSCATH/EMBOLIZ  05/04/2020   IR US GUIDE VASC ACCESS RIGHT  02/06/2020   IR US GUIDE VASC ACCESS RIGHT  05/04/2020   LESION REMOVAL Left 01/25/2021   Procedure: ABNORMAL SKIN LESION REMOVAL LEFT ABDOMINAL WALL;  Surgeon: Coralie Keens, MD;  Location: [place];  Service: General;  Laterality: Left;   NEPHRECTOMY RECIPIENT     ORIF ANKLE FRACTURE N/A 07/13/2021   Procedure: OPEN REDUCTION INTERNAL FIXATION (ORIF) ANKLE FRACTURE;  Surgeon: Georgeanna Harrison, MD;  Location: [facility];  Service: Orthopedics;  Laterality: N/A;   RADIOLOGY WITH ANESTHESIA N/A 05/04/2020   Procedure: Sheppard Plumber;  Surgeon: Luanne Bras, MD;  Location: [place];  Service: Radiology;  Laterality: N/A;    I have reviewed the social history and family history with the patient and they are unchanged from previous note.  ALLERGIES:  is allergic to contrast media [iodinated contrast media] and other.  MEDICATIONS:  Current Outpatient Medications  Medication Sig Dispense Refill   allopurinol (ZYLOPRIM) 100 MG tablet Take 100 mg by mouth at bedtime.     amLODipine (NORVASC) 10 MG  tablet Take 10 mg by mouth daily.     aspirin EC 81 MG EC tablet Take 1 tablet (81 mg total) by mouth daily. Swallow whole. 30 tablet 11   atorvastatin (LIPITOR) 40 MG tablet Take 40 mg by mouth daily.     baclofen (LIORESAL) 10 MG tablet Take 10 mg by mouth 3 (three) times daily as needed for muscle spasms.     Dulaglutide (TRULICITY) 1.5 NW/2.9FA SOPN Inject 1.5 mg into the skin every Monday.      ezetimibe (ZETIA) 10 MG tablet Take 10 mg by mouth daily.     famotidine (PEPCID) 20 MG tablet Take 20 mg by mouth 2 (two) times daily.     feeding supplement, ENSURE ENLIVE, (ENSURE ENLIVE) LIQD Take 237 mLs by mouth 2 (two) times daily between meals. 237 mL 12   furosemide (LASIX) 40 MG tablet  Take 40 mg by mouth daily as needed for fluid or edema.     gabapentin (NEURONTIN) 100 MG capsule Take 2 capsules (200 mg total) by mouth 4 (four) times daily. 240 capsule 3   insulin glargine, 1 Unit Dial, (TOUJEO SOLOSTAR) 300 UNIT/ML Solostar Pen Inject 20 Units into the skin at bedtime.      insulin lispro (HUMALOG) 100 UNIT/ML KwikPen Inject 5 Units into the skin 3 (three) times daily as needed (blood sugar >200).     Ipratropium-Albuterol (COMBIVENT) 20-100 MCG/ACT AERS respimat Inhale 1 puff into the lungs every 6 (six) hours as needed for wheezing or shortness of breath. 1 Inhaler 0   levothyroxine (SYNTHROID) 75 MCG tablet Take 75 mcg by mouth daily.     lidocaine-prilocaine (EMLA) cream Apply 1 Application topically as needed. Apply 2to5 cm of cream to port site 45 minutes to 1 hour prior to port access. 30 g 1   losartan (COZAAR) 25 MG tablet Take 1 tablet (25 mg total) by mouth daily. 30 tablet 11   metoprolol (LOPRESSOR) 50 MG tablet Take 50 mg by mouth 2 (two) times daily.      NARCAN 4 MG/0.1ML LIQD nasal spray kit Place 1 spray into the nose as needed for opioid reversal.     Omega-3 Fatty Acids (FISH OIL PO) Take 1 capsule by mouth 2 (two) times daily.     pantoprazole (PROTONIX) 40 MG tablet Take 40 mg by mouth every morning.     polyethylene glycol powder (MIRALAX) 17 GM/SCOOP powder Take 17 g by mouth 2 (two) times daily as needed for moderate constipation. 255 g 0   senna-docusate (SENOKOT-S) 8.6-50 MG tablet Take 1 tablet by mouth 2 (two) times daily between meals as needed for mild constipation. 60 tablet 0   Tetrahydrozoline HCl (VISINE OP) Place 1 drop into both eyes 3 (three) times daily as needed (dry eyes).     No current facility-administered medications for this visit.    PHYSICAL EXAMINATION: ECOG PERFORMANCE STATUS: 1 - Symptomatic but completely ambulatory  Vitals:   12/30/21 1226  BP: (!) 142/52  Pulse: (!) 57  Resp: 15  Temp: 98.7 F (37.1 C)  SpO2: 98%    Wt Readings from Last 3 Encounters:  12/30/21 169 lb 11.2 oz (77 kg)  12/21/21 175 lb 12.8 oz (79.7 kg)  10/07/21 179 lb 1.6 oz (81.2 kg)     GENERAL:alert, no distress and comfortable SKIN: skin color normal, no rashes or significant lesions EYES: normal, Conjunctiva are pink and non-injected, sclera clear  NEURO: alert & oriented x 3 with fluent speech  LABORATORY DATA:  I have reviewed the data as listed    Latest Ref Rng & Units 12/30/2021   11:52 AM 11/03/2021   12:27 PM 10/07/2021   12:34 PM  CBC  WBC 4.0 - 10.5 K/uL 6.1  7.8  6.6   Hemoglobin 12.0 - 15.0 g/dL 11.6  9.5  9.2   Hematocrit 36.0 - 46.0 % 38.3  32.5  30.9   Platelets 150 - 400 K/uL 239  323  288         Latest Ref Rng & Units 12/30/2021   11:52 AM 11/03/2021   12:27 PM 10/07/2021   12:34 PM  CMP  Glucose 70 - 99 mg/dL 158  140  187   BUN 8 - 23 mg/dL '20  17  22   ' Creatinine 0.44 - 1.00 mg/dL 1.12  1.49  1.54   Sodium 135 - 145 mmol/L 137  138  135   Potassium 3.5 - 5.1 mmol/L 4.2  4.1  4.6   Chloride 98 - 111 mmol/L 107  105  103   CO2 22 - 32 mmol/L '26  27  28   ' Calcium 8.9 - 10.3 mg/dL 10.4  10.2  9.8   Total Protein 6.5 - 8.1 g/dL 7.2  7.1  6.7   Total Bilirubin 0.3 - 1.2 mg/dL 0.3  0.3  0.3   Alkaline Phos 38 - 126 U/L 272  224  387   AST 15 - 41 U/L 34  17  85   ALT 0 - 44 U/L 72  15  65       RADIOGRAPHIC STUDIES: I have personally reviewed the radiological images as listed and agreed with the findings in the report. No results found.    Orders Placed This Encounter  Procedures   MM Digital Screening    Standing Status:   Future    Standing Expiration Date:   12/30/2022    Order Specific Question:   Reason for Exam (SYMPTOM  OR DIAGNOSIS REQUIRED)    Answer:   screening    Order Specific Question:   Preferred imaging location?    Answer:   [facility]   All questions were answered. The patient knows to call the clinic with any problems, questions or concerns. No barriers to  learning was detected. The total time spent in the appointment was 30 minutes.     Truitt Merle, MD 12/30/2021   I, Wilburn Mylar, am acting as scribe for Truitt Merle, MD.   I have reviewed the above documentation for accuracy and completeness, and I agree with the above.

## 2021-12-31 ENCOUNTER — Inpatient Hospital Stay: Payer: Medicare Other

## 2022-01-25 ENCOUNTER — Ambulatory Visit (HOSPITAL_COMMUNITY)
Admission: RE | Admit: 2022-01-25 | Discharge: 2022-01-25 | Disposition: A | Discharge status: Home / Self Care | Payer: Medicare Other | Source: Ambulatory Visit | Attending: Interventional Radiology | Admitting: Interventional Radiology

## 2022-01-25 DIAGNOSIS — I671 Cerebral aneurysm, nonruptured: Secondary | ICD-10-CM | POA: Insufficient documentation

## 2022-01-27 ENCOUNTER — Telehealth (HOSPITAL_COMMUNITY): Payer: Self-pay

## 2022-01-27 NOTE — Telephone Encounter (Signed)
Called pt regarding recent imaging, no answer, left vm. AW  

## 2022-02-01 ENCOUNTER — Inpatient Hospital Stay: Payer: Medicare Other | Attending: Hematology

## 2022-02-01 ENCOUNTER — Inpatient Hospital Stay: Payer: Medicare Other

## 2022-02-01 ENCOUNTER — Other Ambulatory Visit: Payer: Self-pay

## 2022-02-01 DIAGNOSIS — K5520 Angiodysplasia of colon without hemorrhage: Secondary | ICD-10-CM

## 2022-02-01 DIAGNOSIS — Q2733 Arteriovenous malformation of digestive system vessel: Secondary | ICD-10-CM | POA: Insufficient documentation

## 2022-02-01 DIAGNOSIS — D500 Iron deficiency anemia secondary to blood loss (chronic): Secondary | ICD-10-CM

## 2022-02-01 LAB — CMP (CANCER CENTER ONLY)
ALT: 42 U/L (ref 0–44)
AST: 23 U/L (ref 15–41)
Albumin: 4.1 g/dL (ref 3.5–5.0)
Alkaline Phosphatase: 280 U/L — ABNORMAL HIGH (ref 38–126)
Anion gap: 3 — ABNORMAL LOW (ref 5–15)
BUN: 17 mg/dL (ref 8–23)
CO2: 29 mmol/L (ref 22–32)
Calcium: 10.2 mg/dL (ref 8.9–10.3)
Chloride: 106 mmol/L (ref 98–111)
Creatinine: 1.26 mg/dL — ABNORMAL HIGH (ref 0.44–1.00)
GFR, Estimated: 46 mL/min — ABNORMAL LOW (ref >60)
Glucose, Bld: 161 mg/dL — ABNORMAL HIGH (ref 70–99)
Potassium: 5 mmol/L (ref 3.5–5.1)
Sodium: 138 mmol/L (ref 135–145)
Total Bilirubin: 0.3 mg/dL (ref 0.3–1.2)
Total Protein: 7.1 g/dL (ref 6.5–8.1)

## 2022-02-01 LAB — CBC WITH DIFFERENTIAL (CANCER CENTER ONLY)
Abs Immature Granulocytes: 0.04 10*3/uL (ref 0.00–0.07)
Basophils Absolute: 0 10*3/uL (ref 0.0–0.1)
Basophils Relative: 0 %
Eosinophils Absolute: 0.1 10*3/uL (ref 0.0–0.5)
Eosinophils Relative: 2 %
HCT: 37.2 % (ref 36.0–46.0)
Hemoglobin: 11.4 g/dL — ABNORMAL LOW (ref 12.0–15.0)
Immature Granulocytes: 1 %
Lymphocytes Relative: 13 %
Lymphs Abs: 0.9 10*3/uL (ref 0.7–4.0)
MCH: 24.1 pg — ABNORMAL LOW (ref 26.0–34.0)
MCHC: 30.6 g/dL (ref 30.0–36.0)
MCV: 78.5 fL — ABNORMAL LOW (ref 80.0–100.0)
Monocytes Absolute: 0.4 10*3/uL (ref 0.1–1.0)
Monocytes Relative: 6 %
Neutro Abs: 5.3 10*3/uL (ref 1.7–7.7)
Neutrophils Relative %: 78 %
Platelet Count: 248 10*3/uL (ref 150–400)
RBC: 4.74 MIL/uL (ref 3.87–5.11)
RDW: 17.8 % — ABNORMAL HIGH (ref 11.5–15.5)
WBC Count: 6.8 10*3/uL (ref 4.0–10.5)
nRBC: 0 % (ref 0.0–0.2)

## 2022-02-01 LAB — IRON AND IRON BINDING CAPACITY (CC-WL,HP ONLY)
Iron: 62 ug/dL (ref 28–170)
Saturation Ratios: 15 % (ref 10.4–31.8)
TIBC: 402 ug/dL (ref 250–450)
UIBC: 340 ug/dL (ref 148–442)

## 2022-02-01 LAB — FERRITIN: Ferritin: 40 ng/mL (ref 11–307)

## 2022-02-01 MED ORDER — SODIUM CHLORIDE 0.9% FLUSH
10.0000 mL | Freq: Once | INTRAVENOUS | Status: AC
  Administered 2022-02-01: 10 mL

## 2022-02-01 MED ORDER — HEPARIN SOD (PORK) LOCK FLUSH 100 UNIT/ML IV SOLN
500.0000 [IU] | Freq: Once | INTRAVENOUS | Status: AC
  Administered 2022-02-01: 500 [IU]

## 2022-02-15 ENCOUNTER — Other Ambulatory Visit: Payer: Self-pay | Admitting: Hematology

## 2022-02-15 DIAGNOSIS — Z95828 Presence of other vascular implants and grafts: Secondary | ICD-10-CM

## 2022-03-01 ENCOUNTER — Inpatient Hospital Stay: Payer: Medicare Other | Attending: Hematology

## 2022-03-01 ENCOUNTER — Inpatient Hospital Stay: Payer: Medicare Other

## 2022-03-01 ENCOUNTER — Other Ambulatory Visit: Payer: Self-pay

## 2022-03-01 DIAGNOSIS — D500 Iron deficiency anemia secondary to blood loss (chronic): Secondary | ICD-10-CM | POA: Diagnosis present

## 2022-03-01 DIAGNOSIS — Z452 Encounter for adjustment and management of vascular access device: Secondary | ICD-10-CM | POA: Diagnosis not present

## 2022-03-01 DIAGNOSIS — Q2733 Arteriovenous malformation of digestive system vessel: Secondary | ICD-10-CM | POA: Diagnosis present

## 2022-03-01 DIAGNOSIS — K5520 Angiodysplasia of colon without hemorrhage: Secondary | ICD-10-CM

## 2022-03-01 LAB — IRON AND IRON BINDING CAPACITY (CC-WL,HP ONLY)
Iron: 31 ug/dL (ref 28–170)
Saturation Ratios: 7 % — ABNORMAL LOW (ref 10.4–31.8)
TIBC: 428 ug/dL (ref 250–450)
UIBC: 397 ug/dL (ref 148–442)

## 2022-03-01 LAB — CMP (CANCER CENTER ONLY)
ALT: 57 U/L — ABNORMAL HIGH (ref 0–44)
AST: 112 U/L — ABNORMAL HIGH (ref 15–41)
Albumin: 3.9 g/dL (ref 3.5–5.0)
Alkaline Phosphatase: 263 U/L — ABNORMAL HIGH (ref 38–126)
Anion gap: 3 — ABNORMAL LOW (ref 5–15)
BUN: 20 mg/dL (ref 8–23)
CO2: 29 mmol/L (ref 22–32)
Calcium: 9.8 mg/dL (ref 8.9–10.3)
Chloride: 106 mmol/L (ref 98–111)
Creatinine: 1.2 mg/dL — ABNORMAL HIGH (ref 0.44–1.00)
GFR, Estimated: 48 mL/min — ABNORMAL LOW (ref >60)
Glucose, Bld: 159 mg/dL — ABNORMAL HIGH (ref 70–99)
Potassium: 4.4 mmol/L (ref 3.5–5.1)
Sodium: 138 mmol/L (ref 135–145)
Total Bilirubin: 0.4 mg/dL (ref 0.3–1.2)
Total Protein: 6.5 g/dL (ref 6.5–8.1)

## 2022-03-01 LAB — CBC WITH DIFFERENTIAL (CANCER CENTER ONLY)
Abs Immature Granulocytes: 0.02 10*3/uL (ref 0.00–0.07)
Basophils Absolute: 0 10*3/uL (ref 0.0–0.1)
Basophils Relative: 1 %
Eosinophils Absolute: 0.1 10*3/uL (ref 0.0–0.5)
Eosinophils Relative: 2 %
HCT: 31.9 % — ABNORMAL LOW (ref 36.0–46.0)
Hemoglobin: 9.7 g/dL — ABNORMAL LOW (ref 12.0–15.0)
Immature Granulocytes: 0 %
Lymphocytes Relative: 21 %
Lymphs Abs: 1.2 10*3/uL (ref 0.7–4.0)
MCH: 23.7 pg — ABNORMAL LOW (ref 26.0–34.0)
MCHC: 30.4 g/dL (ref 30.0–36.0)
MCV: 78 fL — ABNORMAL LOW (ref 80.0–100.0)
Monocytes Absolute: 0.6 10*3/uL (ref 0.1–1.0)
Monocytes Relative: 11 %
Neutro Abs: 3.8 10*3/uL (ref 1.7–7.7)
Neutrophils Relative %: 65 %
Platelet Count: 245 10*3/uL (ref 150–400)
RBC: 4.09 MIL/uL (ref 3.87–5.11)
RDW: 16.5 % — ABNORMAL HIGH (ref 11.5–15.5)
WBC Count: 5.9 10*3/uL (ref 4.0–10.5)
nRBC: 0 % (ref 0.0–0.2)

## 2022-03-01 MED ORDER — HEPARIN SOD (PORK) LOCK FLUSH 100 UNIT/ML IV SOLN
500.0000 [IU] | Freq: Once | INTRAVENOUS | Status: AC
  Administered 2022-03-01: 500 [IU]

## 2022-03-01 MED ORDER — SODIUM CHLORIDE 0.9% FLUSH
10.0000 mL | Freq: Once | INTRAVENOUS | Status: AC
  Administered 2022-03-01: 10 mL
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
## 2022-03-09 ENCOUNTER — Encounter: Payer: Self-pay | Admitting: Adult Health

## 2022-03-09 ENCOUNTER — Ambulatory Visit (INDEPENDENT_AMBULATORY_CARE_PROVIDER_SITE_OTHER): Payer: Medicare Other | Admitting: Adult Health

## 2022-03-09 VITALS — BP 144/56 | HR 57 | Ht 65.0 in | Wt 174.1 lb

## 2022-03-09 DIAGNOSIS — I639 Cerebral infarction, unspecified: Secondary | ICD-10-CM | POA: Diagnosis not present

## 2022-03-09 DIAGNOSIS — M542 Cervicalgia: Secondary | ICD-10-CM | POA: Diagnosis not present

## 2022-03-09 DIAGNOSIS — I671 Cerebral aneurysm, nonruptured: Secondary | ICD-10-CM | POA: Diagnosis not present

## 2022-03-09 DIAGNOSIS — G44209 Tension-type headache, unspecified, not intractable: Secondary | ICD-10-CM

## 2022-03-09 NOTE — Patient Instructions (Signed)
Referral placed for dry needling for neck tightness likely contributing to your headaches  Continue aspirin 81 mg daily  and Zetia  for secondary stroke prevention  Continue to follow up with PCP regarding cholesterol and blood pressure management  Maintain strict control of hypertension with blood pressure goal below 130/90 and cholesterol with LDL cholesterol (bad cholesterol) goal below 70 mg/dL.   Signs of a Stroke? Follow the BEFAST method:  Balance Watch for a sudden loss of balance, trouble with coordination or vertigo Eyes Is there a sudden loss of vision in one or both eyes? Or double vision?  Face: Ask the person to smile. Does one side of the face droop or is it numb?  Arms: Ask the person to raise both arms. Does one arm drift downward? Is there weakness or numbness of a leg? Speech: Ask the person to repeat a simple phrase. Does the speech sound slurred/strange? Is the person confused ? Time: If you observe any of these signs, call 911.       Thank you for coming to see [place] at [facility]. I hope we have been able to provide you high quality care today.  You may receive a patient satisfaction survey over the next few weeks. We would appreciate your feedback and comments so that we may continue to improve ourselves and the health of our patients.

## 2022-04-01 ENCOUNTER — Inpatient Hospital Stay: Payer: Medicare Other | Attending: Hematology

## 2022-04-01 ENCOUNTER — Other Ambulatory Visit: Payer: Self-pay

## 2022-04-01 ENCOUNTER — Inpatient Hospital Stay: Payer: Medicare Other

## 2022-04-01 DIAGNOSIS — D500 Iron deficiency anemia secondary to blood loss (chronic): Secondary | ICD-10-CM | POA: Insufficient documentation

## 2022-04-01 DIAGNOSIS — Z452 Encounter for adjustment and management of vascular access device: Secondary | ICD-10-CM | POA: Insufficient documentation

## 2022-04-01 DIAGNOSIS — Z95828 Presence of other vascular implants and grafts: Secondary | ICD-10-CM

## 2022-04-01 DIAGNOSIS — Q2733 Arteriovenous malformation of digestive system vessel: Secondary | ICD-10-CM | POA: Diagnosis not present

## 2022-04-01 DIAGNOSIS — K5520 Angiodysplasia of colon without hemorrhage: Secondary | ICD-10-CM

## 2022-04-01 LAB — CMP (CANCER CENTER ONLY)
ALT: 26 U/L (ref 0–44)
AST: 26 U/L (ref 15–41)
Albumin: 4 g/dL (ref 3.5–5.0)
Alkaline Phosphatase: 181 U/L — ABNORMAL HIGH (ref 38–126)
Anion gap: 3 — ABNORMAL LOW (ref 5–15)
BUN: 18 mg/dL (ref 8–23)
CO2: 29 mmol/L (ref 22–32)
Calcium: 10.2 mg/dL (ref 8.9–10.3)
Chloride: 107 mmol/L (ref 98–111)
Creatinine: 1.41 mg/dL — ABNORMAL HIGH (ref 0.44–1.00)
GFR, Estimated: 40 mL/min — ABNORMAL LOW (ref >60)
Glucose, Bld: 151 mg/dL — ABNORMAL HIGH (ref 70–99)
Potassium: 4.2 mmol/L (ref 3.5–5.1)
Sodium: 139 mmol/L (ref 135–145)
Total Bilirubin: 0.4 mg/dL (ref 0.3–1.2)
Total Protein: 6.8 g/dL (ref 6.5–8.1)

## 2022-04-01 LAB — CBC WITH DIFFERENTIAL (CANCER CENTER ONLY)
Abs Immature Granulocytes: 0.02 10*3/uL (ref 0.00–0.07)
Basophils Absolute: 0 10*3/uL (ref 0.0–0.1)
Basophils Relative: 1 %
Eosinophils Absolute: 0.2 10*3/uL (ref 0.0–0.5)
Eosinophils Relative: 3 %
HCT: 28.9 % — ABNORMAL LOW (ref 36.0–46.0)
Hemoglobin: 8.7 g/dL — ABNORMAL LOW (ref 12.0–15.0)
Immature Granulocytes: 0 %
Lymphocytes Relative: 18 %
Lymphs Abs: 1.1 10*3/uL (ref 0.7–4.0)
MCH: 23.1 pg — ABNORMAL LOW (ref 26.0–34.0)
MCHC: 30.1 g/dL (ref 30.0–36.0)
MCV: 76.9 fL — ABNORMAL LOW (ref 80.0–100.0)
Monocytes Absolute: 0.5 10*3/uL (ref 0.1–1.0)
Monocytes Relative: 9 %
Neutro Abs: 4.2 10*3/uL (ref 1.7–7.7)
Neutrophils Relative %: 69 %
Platelet Count: 253 10*3/uL (ref 150–400)
RBC: 3.76 MIL/uL — ABNORMAL LOW (ref 3.87–5.11)
RDW: 16.8 % — ABNORMAL HIGH (ref 11.5–15.5)
WBC Count: 6 10*3/uL (ref 4.0–10.5)
nRBC: 0 % (ref 0.0–0.2)

## 2022-04-01 LAB — IRON AND IRON BINDING CAPACITY (CC-WL,HP ONLY)
Iron: 25 ug/dL — ABNORMAL LOW (ref 28–170)
Saturation Ratios: 5 % — ABNORMAL LOW (ref 10.4–31.8)
TIBC: 465 ug/dL — ABNORMAL HIGH (ref 250–450)
UIBC: 440 ug/dL (ref 148–442)

## 2022-04-01 MED ORDER — HEPARIN SOD (PORK) LOCK FLUSH 100 UNIT/ML IV SOLN
500.0000 [IU] | Freq: Once | INTRAVENOUS | Status: AC
  Administered 2022-04-01: 500 [IU]

## 2022-04-01 MED ORDER — SODIUM CHLORIDE 0.9% FLUSH
10.0000 mL | Freq: Once | INTRAVENOUS | Status: AC
  Administered 2022-04-01: 10 mL

## 2022-04-01 MED ORDER — LIDOCAINE-PRILOCAINE 2.5-2.5 % EX CREA: TOPICAL_CREAM | CUTANEOUS | 1 refills | Status: DC

## 2022-04-04 ENCOUNTER — Other Ambulatory Visit: Payer: Self-pay

## 2022-04-04 ENCOUNTER — Telehealth: Payer: Self-pay

## 2022-04-04 DIAGNOSIS — K5520 Angiodysplasia of colon without hemorrhage: Secondary | ICD-10-CM

## 2022-04-04 DIAGNOSIS — D500 Iron deficiency anemia secondary to blood loss (chronic): Secondary | ICD-10-CM

## 2022-04-04 NOTE — Telephone Encounter (Addendum)
Notified patient of message below   ----- Message from Truitt Merle, MD sent at 04/02/2022 10:52 AM EST ----- Please let pt know her iron level is low, please schedule ferric gluconate '250mg'$  weeklyX4 for her, and place ferritin lab order (standing order) for her, thanks   Truitt Merle

## 2022-04-15 ENCOUNTER — Other Ambulatory Visit: Payer: Self-pay

## 2022-04-15 ENCOUNTER — Inpatient Hospital Stay: Payer: Medicare Other | Attending: Hematology

## 2022-04-15 VITALS — BP 137/48 | HR 55 | Temp 98.8°F | Resp 18 | Wt 171.0 lb

## 2022-04-15 DIAGNOSIS — R918 Other nonspecific abnormal finding of lung field: Secondary | ICD-10-CM | POA: Diagnosis not present

## 2022-04-15 DIAGNOSIS — Q2733 Arteriovenous malformation of digestive system vessel: Secondary | ICD-10-CM | POA: Diagnosis present

## 2022-04-15 DIAGNOSIS — D500 Iron deficiency anemia secondary to blood loss (chronic): Secondary | ICD-10-CM | POA: Diagnosis present

## 2022-04-15 DIAGNOSIS — M25551 Pain in right hip: Secondary | ICD-10-CM | POA: Insufficient documentation

## 2022-04-15 DIAGNOSIS — K5520 Angiodysplasia of colon without hemorrhage: Secondary | ICD-10-CM

## 2022-04-15 MED ORDER — HEPARIN SOD (PORK) LOCK FLUSH 100 UNIT/ML IV SOLN
500.0000 [IU] | Freq: Once | INTRAVENOUS | Status: AC | PRN
  Administered 2022-04-15: 500 [IU]

## 2022-04-15 MED ORDER — DIPHENHYDRAMINE HCL 25 MG PO CAPS
25.0000 mg | ORAL_CAPSULE | Freq: Once | ORAL | Status: AC
  Administered 2022-04-15: 25 mg via ORAL
  Filled 2022-04-15: qty 1

## 2022-04-15 MED ORDER — SODIUM CHLORIDE 0.9% FLUSH
10.0000 mL | Freq: Once | INTRAVENOUS | Status: AC | PRN
  Administered 2022-04-15: 10 mL

## 2022-04-15 MED ORDER — ACETAMINOPHEN 325 MG PO TABS
650.0000 mg | ORAL_TABLET | Freq: Once | ORAL | Status: AC
  Administered 2022-04-15: 650 mg via ORAL
  Filled 2022-04-15: qty 2

## 2022-04-15 MED ORDER — SODIUM CHLORIDE 0.9 % IV SOLN: Freq: Once | INTRAVENOUS | Status: AC

## 2022-04-15 MED ORDER — SODIUM CHLORIDE 0.9 % IV SOLN
250.0000 mg | Freq: Once | INTRAVENOUS | Status: AC
  Administered 2022-04-15: 250 mg via INTRAVENOUS
  Filled 2022-04-15: qty 20

## 2022-04-15 NOTE — Progress Notes (Signed)
Patient declined to stay to stay the post iron 30 minute observation. VSS, patient ambulatory to lobby.

## 2022-04-15 NOTE — Patient Instructions (Signed)
Sodium Ferric Gluconate Complex Injection What is this medication? SODIUM FERRIC GLUCONATE COMPLEX (SOE dee um FER ik GLOO koe nate KOM pleks) treats low levels of iron (iron deficiency anemia) in people with kidney disease. Iron is a mineral that plays an important role in making red blood cells, which carry oxygen from your lungs to the rest of your body. This medicine may be used for other purposes; ask your health care provider or pharmacist if you have questions. COMMON BRAND NAME(S): Ferrlecit, Nulecit What should I tell my care team before I take this medication? They need to know if you have any of the following conditions: Anemia that is not from iron deficiency High levels of iron in the blood An unusual or allergic reaction to iron, other medications, foods, dyes, or preservatives Pregnant or are trying to become pregnant Breast-feeding How should I use this medication? This medication is injected into a vein. It is given by your care team in a hospital or clinic setting. Talk to your care team about the use of this medication in children. While it may be prescribed for children as young as 6 years for selected conditions, precautions do apply. Overdosage: If you think you have taken too much of this medicine contact a poison control center or emergency room at once. NOTE: This medicine is only for you. Do not share this medicine with others. What if I miss a dose? It is important not to miss your dose. Call your care team if you are unable to keep an appointment. What may interact with this medication? Do not take this medication with any of the following: Deferasirox Deferoxamine Dimercaprol This medication may also interact with the following: Other iron products This list may not describe all possible interactions. Give your health care provider a list of all the medicines, herbs, non-prescription drugs, or dietary supplements you use. Also tell them if you smoke, drink  alcohol, or use illegal drugs. Some items may interact with your medicine. What should I watch for while using this medication? Your condition will be monitored carefully while you are receiving this medication. Visit your care team for regular checks on your progress. You may need blood work while you are taking this medication. What side effects may I notice from receiving this medication? Side effects that you should report to your care team as soon as possible: Allergic reactions--skin rash, itching, hives, swelling of the face, lips, tongue, or throat Low blood pressure--dizziness, feeling faint or lightheaded, blurry vision Shortness of breath Side effects that usually do not require medical attention (report to your care team if they continue or are bothersome): Flushing Headache Joint pain Muscle pain Nausea Pain, redness, or irritation at injection site This list may not describe all possible side effects. Call your doctor for medical advice about side effects. You may report side effects to FDA at 1-800-FDA-1088. Where should I keep my medication? This medication is given in a hospital or clinic and will not be stored at home. NOTE: This sheet is a summary. It may not cover all possible information. If you have questions about this medicine, talk to your doctor, pharmacist, or health care provider.  2023 Elsevier/Gold Standard (2020-09-25 00:00:00)  

## 2022-04-19 ENCOUNTER — Ambulatory Visit: Payer: Medicare Other | Attending: Registered Nurse | Admitting: Physical Therapy

## 2022-04-19 VITALS — BP 166/66 | HR 51

## 2022-04-19 DIAGNOSIS — G8929 Other chronic pain: Secondary | ICD-10-CM | POA: Insufficient documentation

## 2022-04-19 DIAGNOSIS — M542 Cervicalgia: Secondary | ICD-10-CM | POA: Insufficient documentation

## 2022-04-19 DIAGNOSIS — M6281 Muscle weakness (generalized): Secondary | ICD-10-CM | POA: Diagnosis present

## 2022-04-19 DIAGNOSIS — M25512 Pain in left shoulder: Secondary | ICD-10-CM | POA: Diagnosis present

## 2022-04-19 DIAGNOSIS — G44209 Tension-type headache, unspecified, not intractable: Secondary | ICD-10-CM | POA: Insufficient documentation

## 2022-04-19 DIAGNOSIS — M25511 Pain in right shoulder: Secondary | ICD-10-CM | POA: Diagnosis present

## 2022-04-19 NOTE — Therapy (Signed)
OUTPATIENT PHYSICAL THERAPY CERVICAL EVALUATION   Patient Name: Bethany [name] MRN: [id number] DOB:05-16-1951, 71 y.o., female Today's Date: 04/20/2022  END OF SESSION:  PT End of Session - 04/19/22 1154     Visit Number 1    Number of Visits 5   with eval   Date for PT Re-Evaluation 06/28/22   to allow for scheduling delay   Authorization Type [employer] Medicare    PT Start Time 2330    PT Stop Time 1240    PT Time Calculation (min) 46 min    Activity Tolerance Patient tolerated treatment well;Patient limited by pain    Behavior During Therapy [facility] for tasks assessed/performed             Past Medical History:  Diagnosis Date   Acute renal failure ([place])    Anemia    Arthritis    COPD (chronic obstructive pulmonary disease) ([place])    Diabetes mellitus without complication ([place])    type II    GERD (gastroesophageal reflux disease)    Hypercalcemia    Hyperlipidemia    Hypertension    Pneumonia    Renal disorder    Single kidney    Stroke [facility])    July 2021   Thrombosis    Tobacco abuse    Past Surgical History:  Procedure Laterality Date   ABDOMINAL HYSTERECTOMY     blood clots removed from descending aorta      BREAST BIOPSY Right 07/28/2021   BREAST BIOPSY Right 08/17/2021   BREAST LUMPECTOMY Right 01/25/2021   BREAST LUMPECTOMY WITH RADIOACTIVE SEED LOCALIZATION Right 01/25/2021   Procedure: RIGHT BREAST LUMPECTOMY WITH RADIOACTIVE SEED LOCALIZATION;  Surgeon: Coralie Keens, MD;  Location: [place];  Service: General;  Laterality: Right;   CHOLECYSTECTOMY     COLONOSCOPY     COLONOSCOPY WITH PROPOFOL N/A 04/17/2017   Procedure: COLONOSCOPY WITH PROPOFOL;  Surgeon: Wilford Corner, MD;  Location: [facility];  Service: Endoscopy;  Laterality: N/A;   ESOPHAGOGASTRODUODENOSCOPY (EGD) WITH PROPOFOL N/A 04/17/2017   Procedure: ESOPHAGOGASTRODUODENOSCOPY (EGD) WITH PROPOFOL;  Surgeon: Wilford Corner, MD;  Location: [facility];  Service:  Endoscopy;  Laterality: N/A;   IR 3D INDEPENDENT WKST  05/04/2020   IR ANGIO INTRA EXTRACRAN SEL COM CAROTID INNOMINATE BILAT MOD SED  02/06/2020   IR ANGIO INTRA EXTRACRAN SEL INTERNAL CAROTID UNI L MOD SED  05/04/2020   IR ANGIO VERTEBRAL SEL VERTEBRAL BILAT MOD SED  02/06/2020   IR ANGIOGRAM FOLLOW UP STUDY  05/04/2020   IR IMAGING GUIDED PORT INSERTION  05/01/2019   IR NEURO EACH ADD'L AFTER BASIC UNI LEFT (MS)  05/04/2020   IR RADIOLOGIST EVAL & MGMT  05/28/2020   IR TRANSCATH/EMBOLIZ  05/04/2020   IR US GUIDE VASC ACCESS RIGHT  02/06/2020   IR US GUIDE VASC ACCESS RIGHT  05/04/2020   LESION REMOVAL Left 01/25/2021   Procedure: ABNORMAL SKIN LESION REMOVAL LEFT ABDOMINAL WALL;  Surgeon: Coralie Keens, MD;  Location: [place];  Service: General;  Laterality: Left;   NEPHRECTOMY RECIPIENT     ORIF ANKLE FRACTURE N/A 07/13/2021   Procedure: OPEN REDUCTION INTERNAL FIXATION (ORIF) ANKLE FRACTURE;  Surgeon: Georgeanna Harrison, MD;  Location: [facility];  Service: Orthopedics;  Laterality: N/A;   RADIOLOGY WITH ANESTHESIA N/A 05/04/2020   Procedure: Sheppard Plumber;  Surgeon: Luanne Bras, MD;  Location: [place];  Service: Radiology;  Laterality: N/A;   Patient Active Problem List   Diagnosis Date Noted   Chronic back  pain 07/15/2021   Nondisplaced fracture of left patella 07/14/2021   Closed right ankle fracture, initial encounter 07/11/2021   Chronic rhinitis 06/22/2021   Tobacco use 04/30/2021   Multiple lung nodules 07/31/2020   Brain aneurysm 05/04/2020   Melena 12/30/2019   CVA (cerebral vascular accident) ([place]) 11/19/2019   Acute on chronic diastolic CHF (congestive heart failure) ([place])    Acute respiratory failure with hypoxia ([place]) 03/27/2019   Fever 03/27/2019   Shortness of breath 03/27/2019   Hypothyroidism 03/27/2019   Chronic GI bleeding 03/27/2019   Pleural effusion on right 03/27/2019   Acute renal failure superimposed on stage 3b chronic kidney  disease ([place]) 03/27/2019   Essential hypertension 03/27/2019   GERD (gastroesophageal reflux disease) 03/27/2019   Acute respiratory failure ([place]) 03/27/2019   Breast mass, right 06/08/2017   Liver mass 06/08/2017   Chronic kidney disease (CKD), stage III (moderate) 03/13/2017   Chronic headaches 03/13/2017   Depression 03/13/2017   Diabetes mellitus type 2 with neurological manifestations ([place]) 03/13/2017   History of thrombosis 03/13/2017   Hyperlipemia, mixed 03/13/2017   Community acquired pneumonia 03/13/2017   COPD (chronic obstructive pulmonary disease) ([place]) 03/13/2017   Pneumonia 03/13/2017   Iron deficiency anemia secondary to blood loss (chronic) 03/07/2017   GI AVM (gastrointestinal arteriovenous vascular malformation) 03/07/2017   History of colonic polyps 10/05/2016    PCP: Arthur Holms, NP  REFERRING PROVIDER: Frann Rider, NP  REFERRING DIAG: M54.2 (ICD-10-CM) - Cervicalgia G44.209 (ICD-10-CM) - Tension headache  THERAPY DIAG:  Cervicalgia  Muscle weakness (generalized)  Chronic right shoulder pain  Chronic left shoulder pain  Rationale for Evaluation and Treatment: Rehabilitation  ONSET DATE: 03/09/2022  SUBJECTIVE:                                                                                                                                                                                                         SUBJECTIVE STATEMENT: Pt reports a history of 3 aneurysms but had one surgically repaired. Pt reports she has been having headaches as well as thyroid issues but was informed the aneurysms were not the cause of her headaches. Pt reports she has had these headaches off/on for years but in the past year they have been more frequent. Pt also reports lots of tightness/tension in shoulders and up into her neck/head, more so on the L side.  PERTINENT HISTORY:  Hx of CVA (L MCA infarct), iron deficiency anemia, CKD, arthritis, COPD, DM, GERD, HLD,  HTN  PAIN:  Are you having pain? Yes: NPRS scale: not rated/10 Pain  location: L and R shoulders and up into neck causing tension headache; L>R Pain description: tightness, soreness, sharp pain Aggravating factors: sleeping (sleeping on R side seems to pull L side of neck) Relieving factors: time, sometimes pain medication, heat and/or ice relieves temporarily  PRECAUTIONS: None  WEIGHT BEARING RESTRICTIONS: No  FALLS:  Has patient fallen in last 6 months? Yes. Number of falls fell last February and broke ankle  LIVING ENVIRONMENT: Lives with: lives alone Lives in: House/apartment Stairs: No; elevator Has following equipment at home: Single point cane and Environmental consultant - 2 wheeled  OCCUPATION: retired  PLOF: Independent with gait and Independent with transfers  PATIENT GOALS: "find out cause of pain/tightness and relieve it"   OBJECTIVE:   DIAGNOSTIC FINDINGS:  None relevant to this plan of care  PATIENT SURVEYS:  NDI 13/45, 28% disability level (*work not assessed due to pt being retired)  COGNITION: Overall cognitive status: Within functional limits for tasks assessed  SENSATION: WFL  POSTURE: rounded shoulders and forward head  PALPATION: Tightness in B UT (L>R), tightness through suboccipitals Trigger points in L UT  VITALS:  Vitals:   04/19/22 1225  BP: (!) 166/66  Pulse: (!) 51    CERVICAL ROM:   Active ROM AROM (deg) eval  Flexion 20  Extension 30  Right lateral flexion 22  Left lateral flexion 15  Right rotation 46  Left rotation 35   (Blank rows = not tested)  UPPER EXTREMITY ROM:  Active ROM Right eval Left eval  Shoulder flexion [facility]  Shoulder extension    Shoulder abduction [facility]  Shoulder adduction    Shoulder extension    Shoulder internal rotation    Shoulder external rotation    Elbow flexion    Elbow extension    Wrist flexion    Wrist extension    Wrist ulnar deviation    Wrist radial deviation    Wrist pronation     Wrist supination     (Blank rows = not tested)  UPPER EXTREMITY MMT:  MMT Right eval Left eval  Shoulder flexion 5 5  Shoulder extension    Shoulder abduction 5 5  Shoulder adduction    Shoulder extension    Shoulder internal rotation    Shoulder external rotation    Middle trapezius    Lower trapezius    Elbow flexion    Elbow extension    Wrist flexion    Wrist extension    Wrist ulnar deviation    Wrist radial deviation    Wrist pronation    Wrist supination    Grip strength     (Blank rows = not tested)  CERVICAL SPECIAL TESTS:  Release of pain/tension with suboccipital release   TODAY'S TREATMENT:                                                                                                                              PT Evaluation  Initiated HEP, see bolded below  PATIENT EDUCATION:  Education details: Eval findings, PT POC Person educated: Patient Education method: Explanation Education comprehension: verbalized understanding and needs further education  HOME EXERCISE PROGRAM: Access Code: [email or web address] URL: [email or web address] Date: 04/19/2022 Prepared by: Excell Seltzer  Exercises - Seated Upper Trapezius Stretch  - 1 x daily - 7 x weekly - 1 sets - 5 reps - 30 sec hold - Supine Suboccipital Release with Tennis Balls  - 1 x daily - 7 x weekly - 1 sets - 1 reps - 5-10 min hold  ASSESSMENT:  CLINICAL IMPRESSION: Patient is a 71 year old female referred to Neuro OPPT for cervicalgia and tension headaches.   Pt's PMH is significant for: Hx of CVA (L MCA infarct), iron deficiency anemia, CKD, arthritis, COPD, DM, GERD, HLD, HTN. The following deficits were present during the exam: decreased cervical AROM, increased disability level based on NDI score, and increased pain. Based on her fall history, pt is an increased risk for falls. Pt would benefit from skilled PT to address these impairments and functional limitations to maximize functional  mobility independence.   OBJECTIVE IMPAIRMENTS: decreased activity tolerance, decreased knowledge of condition, decreased ROM, increased fascial restrictions, impaired flexibility, improper body mechanics, postural dysfunction, and pain.   ACTIVITY LIMITATIONS: carrying, lifting, and sleeping  PARTICIPATION LIMITATIONS: cleaning, driving, and community activity  PERSONAL FACTORS: Age, Time since onset of injury/illness/exacerbation, and 1-2 comorbidities:   Hx of CVA (L MCA infarct), iron deficiency anemia, CKD, arthritis, COPD, DM, GERD, HLD, HTN are also affecting patient's functional outcome.   REHAB POTENTIAL: Good  CLINICAL DECISION MAKING: Stable/uncomplicated  EVALUATION COMPLEXITY: Low   GOALS: Goals reviewed with patient? Yes  SHORT TERM GOALS: Target date: 05/17/2022  Pt will be independent with initial HEP for improved strength, balance, transfers and gait. Baseline: est at eval Goal status: INITIAL   LONG TERM GOALS: Target date: 06/15/2022  Pt will be independent with final HEP for improved strength, balance, transfers and gait. Baseline: est at eval Goal status: INITIAL  2.  Pt will demonstrate decreased disability level as evidenced by score of </= 8/45 on NDI Baseline: 13/45 (12/5) Goal status: INITIAL  3.  Pt will improve B lateral cervical flexion by at least 10 degrees to demonstrate improved function Baseline: 22 deg R, 15 deg L (12/5) Goal status: INITIAL  4.  Pt will improve L cervical rotation by at least 10 degrees to demonstrate improved function Baseline: 35 degrees Goal status: INITIAL   PLAN:  PT FREQUENCY: 1x/week  PT DURATION:  5 total sessions (with eval)  PLANNED INTERVENTIONS: Therapeutic exercises, Therapeutic activity, Neuromuscular re-education, Balance training, Gait training, Patient/Family education, Self Care, Joint mobilization, Joint manipulation, Vestibular training, Canalith repositioning, Visual/preceptual  remediation/compensation, DME instructions, Dry Needling, Electrical stimulation, Spinal manipulation, Spinal mobilization, Cryotherapy, Moist heat, Taping, Traction, Manual therapy, and Re-evaluation  PLAN FOR NEXT SESSION: initiate DN to L UT and suboccipitals (handout provided at eval), add to HEP for postural stability and neck stretches (chin tucks, rows, UT and levator stretches, cervical stretch with towel, thoracic stretch)   Excell Seltzer, PT, DPT, CSRS 04/20/2022, 10:26 AM

## 2022-04-22 ENCOUNTER — Other Ambulatory Visit: Payer: Self-pay

## 2022-04-22 ENCOUNTER — Inpatient Hospital Stay: Payer: Medicare Other

## 2022-04-22 VITALS — BP 148/50 | HR 60 | Temp 98.4°F | Resp 18

## 2022-04-22 DIAGNOSIS — K5520 Angiodysplasia of colon without hemorrhage: Secondary | ICD-10-CM

## 2022-04-22 DIAGNOSIS — Q2733 Arteriovenous malformation of digestive system vessel: Secondary | ICD-10-CM | POA: Diagnosis not present

## 2022-04-22 DIAGNOSIS — D500 Iron deficiency anemia secondary to blood loss (chronic): Secondary | ICD-10-CM

## 2022-04-22 LAB — CBC WITH DIFFERENTIAL (CANCER CENTER ONLY)
Abs Immature Granulocytes: 0.07 10*3/uL (ref 0.00–0.07)
Basophils Absolute: 0 10*3/uL (ref 0.0–0.1)
Basophils Relative: 0 %
Eosinophils Absolute: 0.1 10*3/uL (ref 0.0–0.5)
Eosinophils Relative: 2 %
HCT: 26.7 % — ABNORMAL LOW (ref 36.0–46.0)
Hemoglobin: 7.8 g/dL — ABNORMAL LOW (ref 12.0–15.0)
Immature Granulocytes: 1 %
Lymphocytes Relative: 13 %
Lymphs Abs: 1.1 10*3/uL (ref 0.7–4.0)
MCH: 23 pg — ABNORMAL LOW (ref 26.0–34.0)
MCHC: 29.2 g/dL — ABNORMAL LOW (ref 30.0–36.0)
MCV: 78.8 fL — ABNORMAL LOW (ref 80.0–100.0)
Monocytes Absolute: 0.5 10*3/uL (ref 0.1–1.0)
Monocytes Relative: 5 %
Neutro Abs: 6.6 10*3/uL (ref 1.7–7.7)
Neutrophils Relative %: 79 %
Platelet Count: 206 10*3/uL (ref 150–400)
RBC: 3.39 MIL/uL — ABNORMAL LOW (ref 3.87–5.11)
RDW: 21.7 % — ABNORMAL HIGH (ref 11.5–15.5)
WBC Count: 8.3 10*3/uL (ref 4.0–10.5)
nRBC: 0.8 % — ABNORMAL HIGH (ref 0.0–0.2)

## 2022-04-22 LAB — FERRITIN: Ferritin: 52 ng/mL (ref 11–307)

## 2022-04-22 LAB — IRON AND IRON BINDING CAPACITY (CC-WL,HP ONLY)
Iron: 282 ug/dL — ABNORMAL HIGH (ref 28–170)
Saturation Ratios: 62 % — ABNORMAL HIGH (ref 10.4–31.8)
TIBC: 456 ug/dL — ABNORMAL HIGH (ref 250–450)
UIBC: 174 ug/dL

## 2022-04-22 MED ORDER — SODIUM CHLORIDE 0.9 % IV SOLN
250.0000 mg | Freq: Once | INTRAVENOUS | Status: AC
  Administered 2022-04-22: 250 mg via INTRAVENOUS
  Filled 2022-04-22: qty 20

## 2022-04-22 MED ORDER — SODIUM CHLORIDE 0.9 % IV SOLN: Freq: Once | INTRAVENOUS | Status: AC

## 2022-04-22 MED ORDER — ACETAMINOPHEN 325 MG PO TABS
650.0000 mg | ORAL_TABLET | Freq: Once | ORAL | Status: AC
  Administered 2022-04-22: 650 mg via ORAL
  Filled 2022-04-22: qty 2

## 2022-04-22 MED ORDER — DIPHENHYDRAMINE HCL 25 MG PO CAPS
25.0000 mg | ORAL_CAPSULE | Freq: Once | ORAL | Status: AC
  Administered 2022-04-22: 25 mg via ORAL
  Filled 2022-04-22: qty 1

## 2022-04-22 NOTE — Patient Instructions (Signed)
Sodium Ferric Gluconate Complex Injection What is this medication? SODIUM FERRIC GLUCONATE COMPLEX (SOE dee um FER ik GLOO koe nate KOM pleks) treats low levels of iron (iron deficiency anemia) in people with kidney disease. Iron is a mineral that plays an important role in making red blood cells, which carry oxygen from your lungs to the rest of your body. This medicine may be used for other purposes; ask your health care provider or pharmacist if you have questions. COMMON BRAND NAME(S): Ferrlecit, Nulecit What should I tell my care team before I take this medication? They need to know if you have any of the following conditions: Anemia that is not from iron deficiency High levels of iron in the blood An unusual or allergic reaction to iron, other medications, foods, dyes, or preservatives Pregnant or are trying to become pregnant Breast-feeding How should I use this medication? This medication is injected into a vein. It is given by your care team in a hospital or clinic setting. Talk to your care team about the use of this medication in children. While it may be prescribed for children as young as 6 years for selected conditions, precautions do apply. Overdosage: If you think you have taken too much of this medicine contact a poison control center or emergency room at once. NOTE: This medicine is only for you. Do not share this medicine with others. What if I miss a dose? It is important not to miss your dose. Call your care team if you are unable to keep an appointment. What may interact with this medication? Do not take this medication with any of the following: Deferasirox Deferoxamine Dimercaprol This medication may also interact with the following: Other iron products This list may not describe all possible interactions. Give your health care provider a list of all the medicines, herbs, non-prescription drugs, or dietary supplements you use. Also tell them if you smoke, drink  alcohol, or use illegal drugs. Some items may interact with your medicine. What should I watch for while using this medication? Your condition will be monitored carefully while you are receiving this medication. Visit your care team for regular checks on your progress. You may need blood work while you are taking this medication. What side effects may I notice from receiving this medication? Side effects that you should report to your care team as soon as possible: Allergic reactions--skin rash, itching, hives, swelling of the face, lips, tongue, or throat Low blood pressure--dizziness, feeling faint or lightheaded, blurry vision Shortness of breath Side effects that usually do not require medical attention (report to your care team if they continue or are bothersome): Flushing Headache Joint pain Muscle pain Nausea Pain, redness, or irritation at injection site This list may not describe all possible side effects. Call your doctor for medical advice about side effects. You may report side effects to FDA at 1-800-FDA-1088. Where should I keep my medication? This medication is given in a hospital or clinic and will not be stored at home. NOTE: This sheet is a summary. It may not cover all possible information. If you have questions about this medicine, talk to your doctor, pharmacist, or health care provider.  2023 Elsevier/Gold Standard (2007-06-23 00:00:00) 

## 2022-04-22 NOTE — Progress Notes (Signed)
Patient states she doesn't feel good, she feels drained and states she has had blood in her stool. Streaky, not bright red or runny. Dr. Burr Medico notified, orders to draw a cbc, ferritin, and iron.   Patient declined to stay 30 minute post infusion observation. VSS, no signs of distress noted.

## 2022-04-25 ENCOUNTER — Other Ambulatory Visit: Payer: Self-pay | Admitting: *Deleted

## 2022-04-25 ENCOUNTER — Telehealth: Payer: Self-pay | Admitting: *Deleted

## 2022-04-25 DIAGNOSIS — D500 Iron deficiency anemia secondary to blood loss (chronic): Secondary | ICD-10-CM

## 2022-04-25 NOTE — Telephone Encounter (Signed)
Per Dr.Feng, called pt to make aware that H/H is trending down and will need transfusion of prbcs, lab and visit with Dr.Feng. Pt verbalized understanding. All appts have been made and pt notified.

## 2022-04-28 NOTE — Progress Notes (Signed)
[place]   [phone] [phone]   Clinic Follow up Note   Patient Care Team: Arthur Holms, NP as PCP - General (Nurse Practitioner) Audie Box, Cassie Freer, MD as PCP - Cardiology (Cardiology) Truitt Merle, MD as Consulting Physician (Hematology) Angelia Mould, MD as Consulting Physician (Vascular Surgery) Koleen Distance, MD as Referring Physician (Gastroenterology)  Date of Service:  04/29/2022  CHIEF COMPLAINT: f/u of anemia    CURRENT THERAPY:   IV iron as needed (if ferritin <100)  ASSESSMENT:  Bethany [name] is a 71 y.o. female with   Iron deficiency anemia secondary to blood loss (chronic) --Pt has intestinal AVM history with associated chronic GI blood loss resulting in iron deficiency and blood loss anemia. She has required blood transfusions in the past in 2013 and in 2018 and frequent hospital admission for anemia. She does not meet the diagnosis criteria for hereditary hemorrhagic telangiectasia. -Her last colonoscopy/endoscopy was 02/2019, recall 2025.  -She is not on oral iron, due to the lack of benefit.  -Currently being treated with IV Iron to prevent significant anemia with goal of Ferritin 100-200 range  Multiple lung nodules -h/o smoking intermittently for 40 years, then increased to daily after her mother passed in 2021.  -nodules initially seen on screening chest CT 07/12/19.  -Repeat 07/13/20 showed new nodules. PET scan on 07/28/20 showed mild hypermetabolism to dominant LUL nodule. Biopsy of left lung was attempted but held given difficult location. -she is now followed by Dr. Lamonte Sakai. Most recent CT 11/26/21 shows waxing and waning of pulmonary nodules. Next scheduled 05/26/22. -She continues to smoke. I again encouraged her to quit smoking.  Right side abdominal and hip pain -on-going for a month -exam unremarkable today -will order abdominal US and right hip x-ray -she will f/u with her PCP next week    PLAN: -she has had GI bleeding in past few weeks, anemia slightly improved after two dose iv iron last two weeks -lab reviewed, will proceed iv ferrlecit today and next week  -no blood transfusion today  -lab and f/u in 3 weeks     INTERVAL HISTORY:  Bethany [name] is here for a follow up of  anemia  She was last seen by me on 12/30/21 She presents to the clinic alone. Pt reports she had some blood in her stool last week..She also had some in her urine. Pt also she felt bad after she had her Iron infusion.Pt also report she has some vomiting. She reports that she is still able to do her ADL'S.Pt states her neuropathy gives her problems. She complains about her right side body pain, in RUQ of abdomen, frank and hip area, no fever.     All other systems were reviewed with the patient and are negative.  MEDICAL HISTORY:  Past Medical History:  Diagnosis Date   Acute renal failure (HCC)    Anemia    Arthritis    COPD (chronic obstructive pulmonary disease) (HCC)    Diabetes mellitus without complication (HCC)    type II    GERD (gastroesophageal reflux disease)    Hypercalcemia    Hyperlipidemia    Hypertension    Pneumonia    Renal disorder    Single kidney    Stroke [facility])    July 2021   Thrombosis    Tobacco abuse     SURGICAL HISTORY: Past Surgical History:  Procedure Laterality Date   ABDOMINAL HYSTERECTOMY     blood clots removed  from descending aorta      BREAST BIOPSY Right 07/28/2021   BREAST BIOPSY Right 08/17/2021   BREAST LUMPECTOMY Right 01/25/2021   BREAST LUMPECTOMY WITH RADIOACTIVE SEED LOCALIZATION Right 01/25/2021   Procedure: RIGHT BREAST LUMPECTOMY WITH RADIOACTIVE SEED LOCALIZATION;  Surgeon: Coralie Keens, MD;  Location: [place];  Service: General;  Laterality: Right;   CHOLECYSTECTOMY     COLONOSCOPY     COLONOSCOPY WITH PROPOFOL N/A 04/17/2017   Procedure: COLONOSCOPY WITH PROPOFOL;  Surgeon: Wilford Corner, MD;  Location:  [facility];  Service: Endoscopy;  Laterality: N/A;   ESOPHAGOGASTRODUODENOSCOPY (EGD) WITH PROPOFOL N/A 04/17/2017   Procedure: ESOPHAGOGASTRODUODENOSCOPY (EGD) WITH PROPOFOL;  Surgeon: Wilford Corner, MD;  Location: [facility];  Service: Endoscopy;  Laterality: N/A;   IR 3D INDEPENDENT WKST  05/04/2020   IR ANGIO INTRA EXTRACRAN SEL COM CAROTID INNOMINATE BILAT MOD SED  02/06/2020   IR ANGIO INTRA EXTRACRAN SEL INTERNAL CAROTID UNI L MOD SED  05/04/2020   IR ANGIO VERTEBRAL SEL VERTEBRAL BILAT MOD SED  02/06/2020   IR ANGIOGRAM FOLLOW UP STUDY  05/04/2020   IR IMAGING GUIDED PORT INSERTION  05/01/2019   IR NEURO EACH ADD'L AFTER BASIC UNI LEFT (MS)  05/04/2020   IR RADIOLOGIST EVAL & MGMT  05/28/2020   IR TRANSCATH/EMBOLIZ  05/04/2020   IR US GUIDE VASC ACCESS RIGHT  02/06/2020   IR US GUIDE VASC ACCESS RIGHT  05/04/2020   LESION REMOVAL Left 01/25/2021   Procedure: ABNORMAL SKIN LESION REMOVAL LEFT ABDOMINAL WALL;  Surgeon: Coralie Keens, MD;  Location: [place];  Service: General;  Laterality: Left;   NEPHRECTOMY RECIPIENT     ORIF ANKLE FRACTURE N/A 07/13/2021   Procedure: OPEN REDUCTION INTERNAL FIXATION (ORIF) ANKLE FRACTURE;  Surgeon: Georgeanna Harrison, MD;  Location: [facility];  Service: Orthopedics;  Laterality: N/A;   RADIOLOGY WITH ANESTHESIA N/A 05/04/2020   Procedure: Sheppard Plumber;  Surgeon: Luanne Bras, MD;  Location: [place];  Service: Radiology;  Laterality: N/A;    I have reviewed the social history and family history with the patient and they are unchanged from previous note.  ALLERGIES:  is allergic to contrast media [iodinated contrast media] and other.  MEDICATIONS:  Current Outpatient Medications  Medication Sig Dispense Refill   allopurinol (ZYLOPRIM) 100 MG tablet Take 100 mg by mouth at bedtime.     amLODipine (NORVASC) 10 MG tablet Take 10 mg by mouth daily.     aspirin EC 81 MG EC tablet Take 1 tablet (81 mg total) by mouth daily.  Swallow whole. 30 tablet 11   atorvastatin (LIPITOR) 40 MG tablet Take 40 mg by mouth daily.     baclofen (LIORESAL) 10 MG tablet Take 20 mg by mouth 3 (three) times daily as needed for muscle spasms.     Dulaglutide (TRULICITY) 1.5 HC/6.2BJ SOPN Inject 1.5 mg into the skin every Monday.      ezetimibe (ZETIA) 10 MG tablet Take 10 mg by mouth daily.     famotidine (PEPCID) 20 MG tablet Take 20 mg by mouth 2 (two) times daily.     feeding supplement, ENSURE ENLIVE, (ENSURE ENLIVE) LIQD Take 237 mLs by mouth 2 (two) times daily between meals. 237 mL 12   furosemide (LASIX) 40 MG tablet Take 40 mg by mouth daily as needed for fluid or edema.     gabapentin (NEURONTIN) 100 MG capsule Take 2 capsules (200 mg total) by mouth 4 (four) times daily. 240 capsule 3  insulin glargine, 1 Unit Dial, (TOUJEO SOLOSTAR) 300 UNIT/ML Solostar Pen Inject 20 Units into the skin at bedtime.      insulin lispro (HUMALOG) 100 UNIT/ML KwikPen Inject 5 Units into the skin 3 (three) times daily as needed (blood sugar >200). (Patient not taking: Reported on 03/09/2022)     Ipratropium-Albuterol (COMBIVENT) 20-100 MCG/ACT AERS respimat Inhale 1 puff into the lungs every 6 (six) hours as needed for wheezing or shortness of breath. 1 Inhaler 0   levothyroxine (SYNTHROID) 75 MCG tablet Take 75 mcg by mouth daily.     lidocaine-prilocaine (EMLA) cream APPLY 2-5 cms of cream TO port site 45 MINUTES - ONE HOUR prior TO access 30 g 1   losartan (COZAAR) 25 MG tablet Take 1 tablet (25 mg total) by mouth daily. 30 tablet 11   metoprolol (LOPRESSOR) 50 MG tablet Take 50 mg by mouth 2 (two) times daily.      NARCAN 4 MG/0.1ML LIQD nasal spray kit Place 1 spray into the nose as needed for opioid reversal.     Omega-3 Fatty Acids (FISH OIL PO) Take 1 capsule by mouth 2 (two) times daily.     oxyCODONE ER (XTAMPZA ER) 27 MG C12A      oxyCODONE-acetaminophen (PERCOCET) 10-325 MG tablet Take 1 tablet by mouth every 6 (six) hours as needed.      pantoprazole (PROTONIX) 40 MG tablet Take 40 mg by mouth every morning.     polyethylene glycol powder (MIRALAX) 17 GM/SCOOP powder Take 17 g by mouth 2 (two) times daily as needed for moderate constipation. 255 g 0   senna-docusate (SENOKOT-S) 8.6-50 MG tablet Take 1 tablet by mouth 2 (two) times daily between meals as needed for mild constipation. 60 tablet 0   Tetrahydrozoline HCl (VISINE OP) Place 1 drop into both eyes 3 (three) times daily as needed (dry eyes).     No current facility-administered medications for this visit.    PHYSICAL EXAMINATION: ECOG PERFORMANCE STATUS: 1 - Symptomatic but completely ambulatory  Vitals:   04/29/22 1341  BP: (!) 136/52  Pulse: (!) 50  Resp: 15  Temp: 98.6 F (37 C)  SpO2: 100%   Wt Readings from Last 3 Encounters:  04/29/22 176 lb 9.6 oz (80.1 kg)  04/15/22 171 lb (77.6 kg)  03/09/22 174 lb 2 oz (79 kg)    GENERAL:alert, no distress and comfortable SKIN: skin color, texture, turgor are normal, no rashes or significant lesions EYES: normal, Conjunctiva are pink and non-injected, sclera clear  ABDOMEN:abdomen soft, non-tender and normal bowel sounds. Musculoskeletal:no cyanosis of digits and no clubbing  NEURO: alert & oriented x 3 with fluent speech, no focal motor/sensory deficits  LABORATORY DATA:  I have reviewed the data as listed    Latest Ref Rng & Units 04/29/2022    1:18 PM 04/22/2022    3:58 PM 04/01/2022   11:10 AM  CBC  WBC 4.0 - 10.5 K/uL 7.3  8.3  6.0   Hemoglobin 12.0 - 15.0 g/dL 8.2  7.8  8.7   Hematocrit 36.0 - 46.0 % 28.1  26.7  28.9   Platelets 150 - 400 K/uL 258  206  253         Latest Ref Rng & Units 04/29/2022    1:18 PM 04/01/2022   11:10 AM 03/01/2022   11:22 AM  CMP  Glucose 70 - 99 mg/dL 155  151  159   BUN 8 - 23 mg/dL 20  18  20  Creatinine 0.44 - 1.00 mg/dL 1.34  1.41  1.20   Sodium 135 - 145 mmol/L 138  139  138   Potassium 3.5 - 5.1 mmol/L 5.1  4.2  4.4   Chloride 98 - 111 mmol/L 107   107  106   CO2 22 - 32 mmol/L _0 Calcium 8.9 - 10.3 mg/dL 10.3  10.2  9.8   Total Protein 6.5 - 8.1 g/dL 6.5  6.8  6.5   Total Bilirubin 0.3 - 1.2 mg/dL 0.3  0.4  0.4   Alkaline Phos 38 - 126 U/L 262  181  263   AST 15 - 41 U/L 34  26  112   ALT 0 - 44 U/L 54  26  57       RADIOGRAPHIC STUDIES: I have personally reviewed the radiological images as listed and agreed with the findings in the report. No results found.    Orders Placed This Encounter  Procedures   US Abdomen Complete    Standing Status:   Future    Standing Expiration Date:   04/29/2023    Order Specific Question:   Reason for Exam (SYMPTOM  OR DIAGNOSIS REQUIRED)    Answer:   RUQ pain for a month    Order Specific Question:   Preferred imaging location?    Answer:   [facility]    Order Specific Question:   Release to patient    Answer:   Immediate   DG Hip Unilat W or W/O Pelvis 1 View Right    Standing Status:   Future    Standing Expiration Date:   04/29/2023    Order Specific Question:   Reason for Exam (SYMPTOM  OR DIAGNOSIS REQUIRED)    Answer:   right hip pain    Order Specific Question:   Preferred imaging location?    Answer:   [facility]    Order Specific Question:   Release to patient    Answer:   Immediate   All questions were answered. The patient knows to call the clinic with any problems, questions or concerns. No barriers to learning was detected. The total time spent in the appointment was 30 minutes.     Truitt Merle, MD 04/29/2022   Felicity Coyer, CMA, am acting as scribe for Truitt Merle, MD.   I have reviewed the above documentation for accuracy and completeness, and I agree with the above.

## 2022-04-29 ENCOUNTER — Inpatient Hospital Stay (HOSPITAL_BASED_OUTPATIENT_CLINIC_OR_DEPARTMENT_OTHER): Payer: Medicare Other | Admitting: Hematology

## 2022-04-29 ENCOUNTER — Inpatient Hospital Stay: Payer: Medicare Other

## 2022-04-29 ENCOUNTER — Other Ambulatory Visit: Payer: Medicare Other

## 2022-04-29 ENCOUNTER — Encounter: Payer: Self-pay | Admitting: Hematology

## 2022-04-29 ENCOUNTER — Other Ambulatory Visit: Payer: Self-pay

## 2022-04-29 VITALS — BP 136/52 | HR 50 | Temp 98.6°F | Resp 15 | Wt 176.6 lb

## 2022-04-29 VITALS — BP 132/62 | HR 62 | Temp 98.2°F | Resp 18

## 2022-04-29 DIAGNOSIS — D500 Iron deficiency anemia secondary to blood loss (chronic): Secondary | ICD-10-CM | POA: Diagnosis not present

## 2022-04-29 DIAGNOSIS — R109 Unspecified abdominal pain: Secondary | ICD-10-CM | POA: Diagnosis not present

## 2022-04-29 DIAGNOSIS — R918 Other nonspecific abnormal finding of lung field: Secondary | ICD-10-CM

## 2022-04-29 DIAGNOSIS — Q2733 Arteriovenous malformation of digestive system vessel: Secondary | ICD-10-CM | POA: Diagnosis not present

## 2022-04-29 DIAGNOSIS — K5520 Angiodysplasia of colon without hemorrhage: Secondary | ICD-10-CM

## 2022-04-29 LAB — CBC WITH DIFFERENTIAL (CANCER CENTER ONLY)
Abs Immature Granulocytes: 0.02 10*3/uL (ref 0.00–0.07)
Basophils Absolute: 0 10*3/uL (ref 0.0–0.1)
Basophils Relative: 0 %
Eosinophils Absolute: 0.2 10*3/uL (ref 0.0–0.5)
Eosinophils Relative: 2 %
HCT: 28.1 % — ABNORMAL LOW (ref 36.0–46.0)
Hemoglobin: 8.2 g/dL — ABNORMAL LOW (ref 12.0–15.0)
Immature Granulocytes: 0 %
Lymphocytes Relative: 20 %
Lymphs Abs: 1.5 10*3/uL (ref 0.7–4.0)
MCH: 23.6 pg — ABNORMAL LOW (ref 26.0–34.0)
MCHC: 29.2 g/dL — ABNORMAL LOW (ref 30.0–36.0)
MCV: 81 fL (ref 80.0–100.0)
Monocytes Absolute: 0.6 10*3/uL (ref 0.1–1.0)
Monocytes Relative: 8 %
Neutro Abs: 5.1 10*3/uL (ref 1.7–7.7)
Neutrophils Relative %: 70 %
Platelet Count: 258 10*3/uL (ref 150–400)
RBC: 3.47 MIL/uL — ABNORMAL LOW (ref 3.87–5.11)
RDW: 24.3 % — ABNORMAL HIGH (ref 11.5–15.5)
WBC Count: 7.3 10*3/uL (ref 4.0–10.5)
nRBC: 0.3 % — ABNORMAL HIGH (ref 0.0–0.2)

## 2022-04-29 LAB — CMP (CANCER CENTER ONLY)
ALT: 54 U/L — ABNORMAL HIGH (ref 0–44)
AST: 34 U/L (ref 15–41)
Albumin: 4 g/dL (ref 3.5–5.0)
Alkaline Phosphatase: 262 U/L — ABNORMAL HIGH (ref 38–126)
Anion gap: 3 — ABNORMAL LOW (ref 5–15)
BUN: 20 mg/dL (ref 8–23)
CO2: 28 mmol/L (ref 22–32)
Calcium: 10.3 mg/dL (ref 8.9–10.3)
Chloride: 107 mmol/L (ref 98–111)
Creatinine: 1.34 mg/dL — ABNORMAL HIGH (ref 0.44–1.00)
GFR, Estimated: 42 mL/min — ABNORMAL LOW (ref >60)
Glucose, Bld: 155 mg/dL — ABNORMAL HIGH (ref 70–99)
Potassium: 5.1 mmol/L (ref 3.5–5.1)
Sodium: 138 mmol/L (ref 135–145)
Total Bilirubin: 0.3 mg/dL (ref 0.3–1.2)
Total Protein: 6.5 g/dL (ref 6.5–8.1)

## 2022-04-29 LAB — SAMPLE TO BLOOD BANK

## 2022-04-29 MED ORDER — ACETAMINOPHEN 325 MG PO TABS
650.0000 mg | ORAL_TABLET | Freq: Once | ORAL | Status: AC
  Administered 2022-04-29: 650 mg via ORAL
  Filled 2022-04-29: qty 2

## 2022-04-29 MED ORDER — SODIUM CHLORIDE 0.9 % IV SOLN
250.0000 mg | Freq: Once | INTRAVENOUS | Status: AC
  Administered 2022-04-29: 250 mg via INTRAVENOUS
  Filled 2022-04-29: qty 20

## 2022-04-29 MED ORDER — DIPHENHYDRAMINE HCL 25 MG PO CAPS
25.0000 mg | ORAL_CAPSULE | Freq: Once | ORAL | Status: AC
  Administered 2022-04-29: 25 mg via ORAL
  Filled 2022-04-29: qty 1

## 2022-04-29 MED ORDER — SODIUM CHLORIDE 0.9 % IV SOLN: Freq: Once | INTRAVENOUS | Status: AC

## 2022-04-29 NOTE — Assessment & Plan Note (Signed)
--  Pt has intestinal AVM history with associated chronic GI blood loss resulting in iron deficiency and blood loss anemia. She has required blood transfusions in the past in 2013 and in 2018 and frequent hospital admission for anemia. She does not meet the diagnosis criteria for hereditary hemorrhagic telangiectasia. -Her last colonoscopy/endoscopy was 02/2019, recall 2025.  -She is not on oral iron, due to the lack of benefit.  -Currently being treated with IV Iron to prevent significant anemia with goal of Ferritin 100-200 range

## 2022-04-29 NOTE — Progress Notes (Signed)
Patient declined 30 minute post iron infusion observation. Vss upon discharge AVS declined

## 2022-04-29 NOTE — Assessment & Plan Note (Signed)
-  h/o smoking intermittently for 40 years, then increased to daily after her mother passed in 2021.  -nodules initially seen on screening chest CT 07/12/19.  -Repeat 07/13/20 showed new nodules. PET scan on 07/28/20 showed mild hypermetabolism to dominant LUL nodule. Biopsy of left lung was attempted but held given difficult location. -she is now followed by Dr. Lamonte Sakai. Most recent CT 11/26/21 shows waxing and waning of pulmonary nodules. Next scheduled 05/26/22. -She continues to smoke. I again encouraged her to quit smoking.

## 2022-05-02 ENCOUNTER — Telehealth: Payer: Self-pay | Admitting: Hematology

## 2022-05-02 NOTE — Telephone Encounter (Signed)
Called patient to notify of upcoming appointments. Patient notified. 

## 2022-05-03 ENCOUNTER — Other Ambulatory Visit: Payer: Self-pay

## 2022-05-03 ENCOUNTER — Other Ambulatory Visit: Payer: Self-pay | Admitting: Registered Nurse

## 2022-05-03 ENCOUNTER — Ambulatory Visit
Admission: RE | Admit: 2022-05-03 | Discharge: 2022-05-03 | Disposition: A | Discharge status: Home / Self Care | Payer: Medicare Other | Source: Ambulatory Visit | Attending: Registered Nurse | Admitting: Registered Nurse

## 2022-05-03 DIAGNOSIS — M25551 Pain in right hip: Secondary | ICD-10-CM

## 2022-05-03 DIAGNOSIS — M545 Low back pain, unspecified: Secondary | ICD-10-CM

## 2022-05-03 DIAGNOSIS — R0781 Pleurodynia: Secondary | ICD-10-CM

## 2022-05-04 ENCOUNTER — Inpatient Hospital Stay: Payer: Medicare Other | Admitting: Hematology

## 2022-05-04 ENCOUNTER — Inpatient Hospital Stay: Payer: Medicare Other

## 2022-05-04 ENCOUNTER — Other Ambulatory Visit: Payer: Self-pay

## 2022-05-04 VITALS — BP 128/56 | HR 61 | Resp 16

## 2022-05-04 DIAGNOSIS — Q2733 Arteriovenous malformation of digestive system vessel: Secondary | ICD-10-CM | POA: Diagnosis not present

## 2022-05-04 DIAGNOSIS — K5520 Angiodysplasia of colon without hemorrhage: Secondary | ICD-10-CM

## 2022-05-04 DIAGNOSIS — D500 Iron deficiency anemia secondary to blood loss (chronic): Secondary | ICD-10-CM

## 2022-05-04 LAB — CBC WITH DIFFERENTIAL (CANCER CENTER ONLY)
Abs Immature Granulocytes: 0.02 10*3/uL (ref 0.00–0.07)
Basophils Absolute: 0 10*3/uL (ref 0.0–0.1)
Basophils Relative: 0 %
Eosinophils Absolute: 0.1 10*3/uL (ref 0.0–0.5)
Eosinophils Relative: 2 %
HCT: 27.4 % — ABNORMAL LOW (ref 36.0–46.0)
Hemoglobin: 7.8 g/dL — ABNORMAL LOW (ref 12.0–15.0)
Immature Granulocytes: 0 %
Lymphocytes Relative: 19 %
Lymphs Abs: 1.1 10*3/uL (ref 0.7–4.0)
MCH: 23.4 pg — ABNORMAL LOW (ref 26.0–34.0)
MCHC: 28.5 g/dL — ABNORMAL LOW (ref 30.0–36.0)
MCV: 82 fL (ref 80.0–100.0)
Monocytes Absolute: 0.5 10*3/uL (ref 0.1–1.0)
Monocytes Relative: 9 %
Neutro Abs: 4 10*3/uL (ref 1.7–7.7)
Neutrophils Relative %: 70 %
Platelet Count: 329 10*3/uL (ref 150–400)
RBC: 3.34 MIL/uL — ABNORMAL LOW (ref 3.87–5.11)
RDW: 24.4 % — ABNORMAL HIGH (ref 11.5–15.5)
WBC Count: 5.8 10*3/uL (ref 4.0–10.5)
nRBC: 1.2 % — ABNORMAL HIGH (ref 0.0–0.2)

## 2022-05-04 LAB — CMP (CANCER CENTER ONLY)
ALT: 18 U/L (ref 0–44)
AST: 17 U/L (ref 15–41)
Albumin: 3.8 g/dL (ref 3.5–5.0)
Alkaline Phosphatase: 163 U/L — ABNORMAL HIGH (ref 38–126)
Anion gap: 6 (ref 5–15)
BUN: 20 mg/dL (ref 8–23)
CO2: 27 mmol/L (ref 22–32)
Calcium: 9.9 mg/dL (ref 8.9–10.3)
Chloride: 105 mmol/L (ref 98–111)
Creatinine: 1.37 mg/dL — ABNORMAL HIGH (ref 0.44–1.00)
GFR, Estimated: 41 mL/min — ABNORMAL LOW (ref >60)
Glucose, Bld: 133 mg/dL — ABNORMAL HIGH (ref 70–99)
Potassium: 4.1 mmol/L (ref 3.5–5.1)
Sodium: 138 mmol/L (ref 135–145)
Total Bilirubin: 0.4 mg/dL (ref 0.3–1.2)
Total Protein: 6.5 g/dL (ref 6.5–8.1)

## 2022-05-04 LAB — IRON AND IRON BINDING CAPACITY (CC-WL,HP ONLY)
Iron: 53 ug/dL (ref 28–170)
Saturation Ratios: 14 % (ref 10.4–31.8)
TIBC: 391 ug/dL (ref 250–450)
UIBC: 338 ug/dL (ref 148–442)

## 2022-05-04 MED ORDER — ACETAMINOPHEN 325 MG PO TABS
650.0000 mg | ORAL_TABLET | Freq: Once | ORAL | Status: AC
  Administered 2022-05-04: 650 mg via ORAL
  Filled 2022-05-04: qty 2

## 2022-05-04 MED ORDER — HEPARIN SOD (PORK) LOCK FLUSH 100 UNIT/ML IV SOLN: 500.0000 [IU] | Freq: Once | INTRAVENOUS | Status: DC | PRN

## 2022-05-04 MED ORDER — SODIUM CHLORIDE 0.9 % IV SOLN: Freq: Once | INTRAVENOUS | Status: AC

## 2022-05-04 MED ORDER — SODIUM CHLORIDE 0.9% FLUSH: 10.0000 mL | Freq: Once | INTRAVENOUS | Status: DC | PRN

## 2022-05-04 MED ORDER — DIPHENHYDRAMINE HCL 25 MG PO CAPS
25.0000 mg | ORAL_CAPSULE | Freq: Once | ORAL | Status: AC
  Administered 2022-05-04: 25 mg via ORAL
  Filled 2022-05-04: qty 1

## 2022-05-04 MED ORDER — SODIUM CHLORIDE 0.9 % IV SOLN
250.0000 mg | Freq: Once | INTRAVENOUS | Status: AC
  Administered 2022-05-04: 250 mg via INTRAVENOUS
  Filled 2022-05-04: qty 20

## 2022-05-04 NOTE — Progress Notes (Signed)
Patient declined 30 minute post observation.

## 2022-05-04 NOTE — Patient Instructions (Signed)

## 2022-05-04 NOTE — Patient Instructions (Signed)
Sodium Ferric Gluconate Complex Injection What is this medication? SODIUM FERRIC GLUCONATE COMPLEX (SOE dee um FER ik GLOO koe nate KOM pleks) treats low levels of iron (iron deficiency anemia) in people with kidney disease. Iron is a mineral that plays an important role in making red blood cells, which carry oxygen from your lungs to the rest of your body. This medicine may be used for other purposes; ask your health care provider or pharmacist if you have questions. COMMON BRAND NAME(S): Ferrlecit, Nulecit What should I tell my care team before I take this medication? They need to know if you have any of the following conditions: Anemia that is not from iron deficiency High levels of iron in the blood An unusual or allergic reaction to iron, other medications, foods, dyes, or preservatives Pregnant or are trying to become pregnant Breast-feeding How should I use this medication? This medication is injected into a vein. It is given by your care team in a hospital or clinic setting. Talk to your care team about the use of this medication in children. While it may be prescribed for children as young as 6 years for selected conditions, precautions do apply. Overdosage: If you think you have taken too much of this medicine contact a poison control center or emergency room at once. NOTE: This medicine is only for you. Do not share this medicine with others. What if I miss a dose? It is important not to miss your dose. Call your care team if you are unable to keep an appointment. What may interact with this medication? Do not take this medication with any of the following: Deferasirox Deferoxamine Dimercaprol This medication may also interact with the following: Other iron products This list may not describe all possible interactions. Give your health care provider a list of all the medicines, herbs, non-prescription drugs, or dietary supplements you use. Also tell them if you smoke, drink  alcohol, or use illegal drugs. Some items may interact with your medicine. What should I watch for while using this medication? Your condition will be monitored carefully while you are receiving this medication. Visit your care team for regular checks on your progress. You may need blood work while you are taking this medication. What side effects may I notice from receiving this medication? Side effects that you should report to your care team as soon as possible: Allergic reactions--skin rash, itching, hives, swelling of the face, lips, tongue, or throat Low blood pressure--dizziness, feeling faint or lightheaded, blurry vision Shortness of breath Side effects that usually do not require medical attention (report to your care team if they continue or are bothersome): Flushing Headache Joint pain Muscle pain Nausea Pain, redness, or irritation at injection site This list may not describe all possible side effects. Call your doctor for medical advice about side effects. You may report side effects to FDA at 1-800-FDA-1088. Where should I keep my medication? This medication is given in a hospital or clinic and will not be stored at home. NOTE: This sheet is a summary. It may not cover all possible information. If you have questions about this medicine, talk to your doctor, pharmacist, or health care provider.  2023 Elsevier/Gold Standard (2007-06-23 00:00:00) 

## 2022-05-06 ENCOUNTER — Encounter: Payer: Self-pay | Admitting: Hematology

## 2022-05-17 ENCOUNTER — Ambulatory Visit: Payer: 59 | Attending: Registered Nurse | Admitting: Physical Therapy

## 2022-05-17 VITALS — BP 122/51

## 2022-05-17 DIAGNOSIS — M6281 Muscle weakness (generalized): Secondary | ICD-10-CM | POA: Diagnosis present

## 2022-05-17 DIAGNOSIS — M542 Cervicalgia: Secondary | ICD-10-CM | POA: Diagnosis present

## 2022-05-17 DIAGNOSIS — G8929 Other chronic pain: Secondary | ICD-10-CM | POA: Diagnosis present

## 2022-05-17 DIAGNOSIS — M25512 Pain in left shoulder: Secondary | ICD-10-CM | POA: Diagnosis present

## 2022-05-17 DIAGNOSIS — M25511 Pain in right shoulder: Secondary | ICD-10-CM | POA: Diagnosis present

## 2022-05-17 NOTE — Therapy (Signed)
OUTPATIENT PHYSICAL THERAPY CERVICAL TREATMENT   Patient Name: Bethany [name] MRN: [id number] DOB:09-Apr-1951, 72 y.o., female Today's Date: 05/17/2022  END OF SESSION:  PT End of Session - 05/17/22 1157     Visit Number 2    Number of Visits 5   with eval   Date for PT Re-Evaluation 06/28/22   to allow for scheduling delay   Authorization Type [employer] Medicare    PT Start Time 1155   this therapist ran over with previous patient   PT Stop Time 1247    PT Time Calculation (min) 52 min    Activity Tolerance Patient tolerated treatment well;Patient limited by pain    Behavior During Therapy [facility] for tasks assessed/performed              Past Medical History:  Diagnosis Date   Acute renal failure ([place])    Anemia    Arthritis    COPD (chronic obstructive pulmonary disease) ([place])    Diabetes mellitus without complication ([place])    type II    GERD (gastroesophageal reflux disease)    Hypercalcemia    Hyperlipidemia    Hypertension    Pneumonia    Renal disorder    Single kidney    Stroke [facility])    July 2021   Thrombosis    Tobacco abuse    Past Surgical History:  Procedure Laterality Date   ABDOMINAL HYSTERECTOMY     blood clots removed from descending aorta      BREAST BIOPSY Right 07/28/2021   BREAST BIOPSY Right 08/17/2021   BREAST LUMPECTOMY Right 01/25/2021   BREAST LUMPECTOMY WITH RADIOACTIVE SEED LOCALIZATION Right 01/25/2021   Procedure: RIGHT BREAST LUMPECTOMY WITH RADIOACTIVE SEED LOCALIZATION;  Surgeon: Coralie Keens, MD;  Location: [place];  Service: General;  Laterality: Right;   CHOLECYSTECTOMY     COLONOSCOPY     COLONOSCOPY WITH PROPOFOL N/A 04/17/2017   Procedure: COLONOSCOPY WITH PROPOFOL;  Surgeon: Wilford Corner, MD;  Location: [facility];  Service: Endoscopy;  Laterality: N/A;   ESOPHAGOGASTRODUODENOSCOPY (EGD) WITH PROPOFOL N/A 04/17/2017   Procedure: ESOPHAGOGASTRODUODENOSCOPY (EGD) WITH PROPOFOL;  Surgeon: Wilford Corner, MD;  Location: [facility];  Service: Endoscopy;  Laterality: N/A;   IR 3D INDEPENDENT WKST  05/04/2020   IR ANGIO INTRA EXTRACRAN SEL COM CAROTID INNOMINATE BILAT MOD SED  02/06/2020   IR ANGIO INTRA EXTRACRAN SEL INTERNAL CAROTID UNI L MOD SED  05/04/2020   IR ANGIO VERTEBRAL SEL VERTEBRAL BILAT MOD SED  02/06/2020   IR ANGIOGRAM FOLLOW UP STUDY  05/04/2020   IR IMAGING GUIDED PORT INSERTION  05/01/2019   IR NEURO EACH ADD'L AFTER BASIC UNI LEFT (MS)  05/04/2020   IR RADIOLOGIST EVAL & MGMT  05/28/2020   IR TRANSCATH/EMBOLIZ  05/04/2020   IR US GUIDE VASC ACCESS RIGHT  02/06/2020   IR US GUIDE VASC ACCESS RIGHT  05/04/2020   LESION REMOVAL Left 01/25/2021   Procedure: ABNORMAL SKIN LESION REMOVAL LEFT ABDOMINAL WALL;  Surgeon: Coralie Keens, MD;  Location: [place];  Service: General;  Laterality: Left;   NEPHRECTOMY RECIPIENT     ORIF ANKLE FRACTURE N/A 07/13/2021   Procedure: OPEN REDUCTION INTERNAL FIXATION (ORIF) ANKLE FRACTURE;  Surgeon: Georgeanna Harrison, MD;  Location: [facility];  Service: Orthopedics;  Laterality: N/A;   RADIOLOGY WITH ANESTHESIA N/A 05/04/2020   Procedure: Sheppard Plumber;  Surgeon: Luanne Bras, MD;  Location: [place];  Service: Radiology;  Laterality: N/A;   Patient Active Problem List  Diagnosis Date Noted   Chronic back pain 07/15/2021   Nondisplaced fracture of left patella 07/14/2021   Closed right ankle fracture, initial encounter 07/11/2021   Chronic rhinitis 06/22/2021   Tobacco use 04/30/2021   Multiple lung nodules 07/31/2020   Brain aneurysm 05/04/2020   Melena 12/30/2019   CVA (cerebral vascular accident) ([place]) 11/19/2019   Acute on chronic diastolic CHF (congestive heart failure) ([place])    Acute respiratory failure with hypoxia ([place]) 03/27/2019   Fever 03/27/2019   Shortness of breath 03/27/2019   Hypothyroidism 03/27/2019   Chronic GI bleeding 03/27/2019   Pleural effusion on right 03/27/2019   Acute renal  failure superimposed on stage 3b chronic kidney disease ([place]) 03/27/2019   Essential hypertension 03/27/2019   GERD (gastroesophageal reflux disease) 03/27/2019   Acute respiratory failure ([place]) 03/27/2019   Breast mass, right 06/08/2017   Liver mass 06/08/2017   Chronic kidney disease (CKD), stage III (moderate) 03/13/2017   Chronic headaches 03/13/2017   Depression 03/13/2017   Diabetes mellitus type 2 with neurological manifestations ([place]) 03/13/2017   History of thrombosis 03/13/2017   Hyperlipemia, mixed 03/13/2017   Community acquired pneumonia 03/13/2017   COPD (chronic obstructive pulmonary disease) ([place]) 03/13/2017   Pneumonia 03/13/2017   Iron deficiency anemia secondary to blood loss (chronic) 03/07/2017   GI AVM (gastrointestinal arteriovenous vascular malformation) 03/07/2017   History of colonic polyps 10/05/2016    PCP: Arthur Holms, NP  REFERRING PROVIDER: Frann Rider, NP  REFERRING DIAG: M54.2 (ICD-10-CM) - Cervicalgia G44.209 (ICD-10-CM) - Tension headache  THERAPY DIAG:  Cervicalgia  Muscle weakness (generalized)  Chronic right shoulder pain  Chronic left shoulder pain  Rationale for Evaluation and Treatment: Rehabilitation  ONSET DATE: 03/09/2022  SUBJECTIVE:                                                                                                                                                                                                         SUBJECTIVE STATEMENT: Pt reports she has been doing her HEP and has found it helpful, but then states she has not yet purchased tennis balls for suboccipital release. Pt reports she has been using a massager that also provides heat for her shoulder pain and that has been helpful. Pt reports her pain has not been as intense and her stress levels have been better.  Pt also reports ongoing concern regarding pain that she has in her R hip and R rib area, no evidence of acute fractures per imaging in  mid-December. Pt reports she did have a fall last February but  she has had this pain since before this fall. Encouraged pt to continue to speak with her PCP regarding imaging and/or assessment of this ongoing pain.  PERTINENT HISTORY:  Hx of CVA (L MCA infarct), iron deficiency anemia, CKD, arthritis, COPD, DM, GERD, HLD, HTN  PAIN:  Are you having pain? Yes: NPRS scale: 6/10 Pain location: L and R shoulders and up into neck causing tension headache; L>R Pain description: tightness, soreness, sharp pain Aggravating factors: sleeping (sleeping on R side seems to pull L side of neck) Relieving factors: time, sometimes pain medication, heat and/or ice relieves temporarily  PRECAUTIONS: None  WEIGHT BEARING RESTRICTIONS: No  FALLS:  Has patient fallen in last 6 months? Yes. Number of falls fell last February and broke ankle  LIVING ENVIRONMENT: Lives with: lives alone Lives in: House/apartment Stairs: No; elevator Has following equipment at home: Single point cane and Environmental consultant - 2 wheeled  OCCUPATION: retired  PLOF: Independent with gait and Independent with transfers  PATIENT GOALS: "find out cause of pain/tightness and relieve it"   OBJECTIVE:   VITALS:  Vitals:   05/17/22 1209  BP: (!) 122/51    Objective measures taken at evaluation unless otherwise noted or dated:  CERVICAL ROM:   Active ROM AROM (deg) eval  Flexion 20  Extension 30  Right lateral flexion 22  Left lateral flexion 15  Right rotation 46  Left rotation 35   (Blank rows = not tested)    TODAY'S TREATMENT:                                                                                                                              THER EX: Seated UT stretch 2 x 30 sec each bilaterally Seated levator stretch 2 x 30 sec each bilaterally Pt has onset of headache when stretching R side, symptoms resolve with decreased intensity of stretch Seated chin tucks x 10 reps Increases headache  symptoms  Reviewed UT stretch for HEP, added levator stretch to HEP (see bolded below)  THER ACT:  Trigger Point Dry-Needling  Treatment instructions: Expect mild to moderate muscle soreness. S/S of pneumothorax if dry needled over a lung field, and to seek immediate medical attention should they occur. Patient verbalized understanding of these instructions and education.  Patient Consent Given: Yes Education handout provided: Previously provided Muscles treated: L and R upper traps Treatment response/outcome: deep ache/muscle twitch in R UT, onset of nausea with L UT so further treatment deferred and did not perform on suboccipitals this date due to positioning required for this treatment and ongoing nausea   Trigger Point Dry Needling  What is Trigger Point Dry Needling (DN)? DN is a physical therapy technique used to treat muscle pain and dysfunction. Specifically, DN helps deactivate muscle trigger points (muscle knots).  A thin filiform needle is used to penetrate the skin and stimulate the underlying trigger point. The goal is for a local twitch response (LTR) to occur and  for the trigger point to relax. No medication of any kind is injected during the procedure.   What Does Trigger Point Dry Needling Feel Like?  The procedure feels different for each individual patient. Some patients report that they do not actually feel the needle enter the skin and overall the process is not painful. Very mild bleeding may occur. However, many patients feel a deep cramping in the muscle in which the needle was inserted. This is the local twitch response.   How Will I feel after the treatment? Soreness is normal, and the onset of soreness may not occur for a few hours. Typically this soreness does not last longer than two days.  Bruising is uncommon, however; ice can be used to decrease any possible bruising.  In rare cases feeling tired or nauseous after the treatment is normal. In addition, your  symptoms may get worse before they get better, this period will typically not last longer than 24 hours.   What Can I do After My Treatment? Increase your hydration by drinking more water for the next 24 hours. You may place ice or heat on the areas treated that have become sore, however, do not use heat on inflamed or bruised areas. Heat often brings more relief post needling. You can continue your regular activities, but vigorous activity is not recommended initially after the treatment for 24 hours. DN is best combined with other physical therapy such as strengthening, stretching, and other therapies.    PATIENT EDUCATION:  Education details: continue HEP, added to HEP, TPDN (see above) Person educated: Patient Education method: Explanation and Handouts Education comprehension: verbalized understanding and needs further education  HOME EXERCISE PROGRAM: Access Code: [email or web address] URL: [email or web address] Date: 04/19/2022 Prepared by: Excell Seltzer  Exercises - Seated Upper Trapezius Stretch  - 1 x daily - 7 x weekly - 1 sets - 5 reps - 30 sec hold - Supine Suboccipital Release with Tennis Balls  - 1 x daily - 7 x weekly - 1 sets - 1 reps - 5-10 min hold - Gentle Levator Scapulae Stretch  - 1 x daily - 7 x weekly - 1 sets - 5 reps - 30 sec hold  ASSESSMENT:  CLINICAL IMPRESSION: Emphasis of skilled PT session performing TPDN to address pain/tightness in shoulders/upper trap region and adding stretches and exercises to HEP to address ongoing pain and tightness. Pt has some initial relief of R UT tightness with TPDN this date but has onset of nausea with L UT treatment, deferred further needling this session due to nausea. Pt has increase in headache symptoms with levator scap stretch on R side and with chin tucks, instructed pt to perform stretching in pain-free range so that she feels a stretch but so that she does not have headache symptoms and deferred further performance  of chin tucks. Pt continues to be limited by her pain and symptoms and can benefit from ongoing skilled therapy services to address this and improve her overall function. Pt is making progress towards her STG of being independent with her initial HEP. This was the patient's first visit since her initial evaluation and she needs continued encouragement to work on her HEP as well as needs continued trials of various exercises and/or stretches to determine most appropriate treatment for her at this time. Continue POC.    OBJECTIVE IMPAIRMENTS: decreased activity tolerance, decreased knowledge of condition, decreased ROM, increased fascial restrictions, impaired flexibility, improper body mechanics, postural dysfunction, and pain.   ACTIVITY LIMITATIONS: carrying, lifting,  and sleeping  PARTICIPATION LIMITATIONS: cleaning, driving, and community activity  PERSONAL FACTORS: Age, Time since onset of injury/illness/exacerbation, and 1-2 comorbidities:   Hx of CVA (L MCA infarct), iron deficiency anemia, CKD, arthritis, COPD, DM, GERD, HLD, HTN are also affecting patient's functional outcome.   REHAB POTENTIAL: Good  CLINICAL DECISION MAKING: Stable/uncomplicated  EVALUATION COMPLEXITY: Low   GOALS: Goals reviewed with patient? Yes  SHORT TERM GOALS: Target date: 05/17/2022  Pt will be independent with initial HEP for improved strength, balance, transfers and gait. Baseline: est at eval Goal status: IN PROGRESS   LONG TERM GOALS: Target date: 06/15/2022  Pt will be independent with final HEP for improved strength, balance, transfers and gait. Baseline: est at eval Goal status: INITIAL  2.  Pt will demonstrate decreased disability level as evidenced by score of </= 8/45 on NDI Baseline: 13/45 (12/5) Goal status: INITIAL  3.  Pt will improve B lateral cervical flexion by at least 10 degrees to demonstrate improved function Baseline: 22 deg R, 15 deg L (12/5) Goal status: INITIAL  4.  Pt  will improve L cervical rotation by at least 10 degrees to demonstrate improved function Baseline: 35 degrees Goal status: INITIAL   PLAN:  PT FREQUENCY: 1x/week  PT DURATION:  5 total sessions (with eval)  PLANNED INTERVENTIONS: Therapeutic exercises, Therapeutic activity, Neuromuscular re-education, Balance training, Gait training, Patient/Family education, Self Care, Joint mobilization, Joint manipulation, Vestibular training, Canalith repositioning, Visual/preceptual remediation/compensation, DME instructions, Dry Needling, Electrical stimulation, Spinal manipulation, Spinal mobilization, Cryotherapy, Moist heat, Taping, Traction, Manual therapy, and Re-evaluation  PLAN FOR NEXT SESSION: DN to UT and suboccipitals if pt not nauseous, add to HEP for postural stability and neck stretches (chin tucks, rows, cervical stretch with towel, thoracic stretch), manual therapy (distraction, suboccipital release, UT)   Excell Seltzer, PT, DPT, CSRS 05/17/2022, 12:48 PM

## 2022-05-19 NOTE — Progress Notes (Signed)
[place]   [phone] [phone]   Clinic Follow up Note   Patient Care Team: Arthur Holms, NP as PCP - General (Nurse Practitioner) Audie Box, Cassie Freer, MD as PCP - Cardiology (Cardiology) Truitt Merle, MD as Consulting Physician (Hematology) Angelia Mould, MD as Consulting Physician (Vascular Surgery) Koleen Distance, MD as Referring Physician (Gastroenterology)  Date of Service:  05/20/2022  CHIEF COMPLAINT: f/u of  anemia   CURRENT THERAPY: V iron as needed (if ferritin <100)    ASSESSMENT:  Bethany [name] is a 72 y.o. female with   Iron deficiency anemia secondary to blood loss (chronic) -Pt has intestinal AVM history with associated chronic GI blood loss resulting in iron deficiency and blood loss anemia. She has required blood transfusions in the past in 2013 and in 2018 and frequent hospital admission for anemia. She does not meet the diagnosis criteria for hereditary hemorrhagic telangiectasia. -Her last colonoscopy/endoscopy was 02/2019, recall 2025.  -She is not on oral iron, due to the lack of benefit.  -Currently being treated with IV Iron to prevent significant anemia with goal of Ferritin 100-200 range  Multiple lung nodules h/o smoking intermittently for 40 years, then increased to daily after her mother passed in 2021.  -nodules initially seen on screening chest CT 07/12/19.  -Repeat 07/13/20 showed new nodules. PET scan on 07/28/20 showed mild hypermetabolism to dominant LUL nodule. Biopsy of left lung was attempted but held given difficult location. -she is now followed by Dr. Lamonte Sakai. Most recent CT 11/26/21 shows waxing and waning of pulmonary nodules. Next scheduled 05/26/22. -She continues to smoke. I again encouraged her to quit smoking.     PLAN: -lab reviewed hg 9.2, much improved  -Monitor lab monthly  -lab/f/u in 38mhs -I encouraged her to call breast center for annual screening mammogram, which is due in March  2024  SUMMARY OF ONCOLOGIC HISTORY: Oncology History  Iron deficiency anemia secondary to blood loss (chronic)     INTERVAL HISTORY:  Bethany GIBBINSis here for a follow up of anemia  She was last seen by me on 04/29/22 She presents to the clinic alone. Pt state she still has the right side discomfort. She had an x- ray done. Pt reports a sore under her foot but has an appointment to the Podiatrist.    All other systems were reviewed with the patient and are negative.  MEDICAL HISTORY:  Past Medical History:  Diagnosis Date   Acute renal failure (HCC)    Anemia    Arthritis    COPD (chronic obstructive pulmonary disease) (HCC)    Diabetes mellitus without complication (HCC)    type II    GERD (gastroesophageal reflux disease)    Hypercalcemia    Hyperlipidemia    Hypertension    Pneumonia    Renal disorder    Single kidney    Stroke ([facility]    July 2021   Thrombosis    Tobacco abuse     SURGICAL HISTORY: Past Surgical History:  Procedure Laterality Date   ABDOMINAL HYSTERECTOMY     blood clots removed from descending aorta      BREAST BIOPSY Right 07/28/2021   BREAST BIOPSY Right 08/17/2021   BREAST LUMPECTOMY Right 01/25/2021   BREAST LUMPECTOMY WITH RADIOACTIVE SEED LOCALIZATION Right 01/25/2021   Procedure: RIGHT BREAST LUMPECTOMY WITH RADIOACTIVE SEED LOCALIZATION;  Surgeon: BCoralie Keens MD;  [place]  Service: General;  Laterality: Right;   CHOLECYSTECTOMY  COLONOSCOPY     COLONOSCOPY WITH PROPOFOL N/A 04/17/2017   Procedure: COLONOSCOPY WITH PROPOFOL;  Surgeon: Wilford Corner, MD;  [facility];  Service: Endoscopy;  Laterality: N/A;   ESOPHAGOGASTRODUODENOSCOPY (EGD) WITH PROPOFOL N/A 04/17/2017   Procedure: ESOPHAGOGASTRODUODENOSCOPY (EGD) WITH PROPOFOL;  Surgeon: Wilford Corner, MD;  [facility];  Service: Endoscopy;  Laterality: N/A;   IR 3D INDEPENDENT WKST  05/04/2020   IR ANGIO INTRA  EXTRACRAN SEL COM CAROTID INNOMINATE BILAT MOD SED  02/06/2020   IR ANGIO INTRA EXTRACRAN SEL INTERNAL CAROTID UNI L MOD SED  05/04/2020   IR ANGIO VERTEBRAL SEL VERTEBRAL BILAT MOD SED  02/06/2020   IR ANGIOGRAM FOLLOW UP STUDY  05/04/2020   IR IMAGING GUIDED PORT INSERTION  05/01/2019   IR NEURO EACH ADD'L AFTER BASIC UNI LEFT (MS)  05/04/2020   IR RADIOLOGIST EVAL & MGMT  05/28/2020   IR TRANSCATH/EMBOLIZ  05/04/2020   IR US GUIDE VASC ACCESS RIGHT  02/06/2020   IR US GUIDE VASC ACCESS RIGHT  05/04/2020   LESION REMOVAL Left 01/25/2021   Procedure: ABNORMAL SKIN LESION REMOVAL LEFT ABDOMINAL WALL;  Surgeon: Coralie Keens, MD;  Location: [place];  Service: General;  Laterality: Left;   NEPHRECTOMY RECIPIENT     ORIF ANKLE FRACTURE N/A 07/13/2021   Procedure: OPEN REDUCTION INTERNAL FIXATION (ORIF) ANKLE FRACTURE;  Surgeon: Georgeanna Harrison, MD;  [facility];  Service: Orthopedics;  Laterality: N/A;   RADIOLOGY WITH ANESTHESIA N/A 05/04/2020   Procedure: Sheppard Plumber;  Surgeon: Luanne Bras, MD;  Location: [place];  Service: Radiology;  Laterality: N/A;    I have reviewed the social history and family history with the patient and they are unchanged from previous note.  ALLERGIES:  is allergic to contrast media [iodinated contrast media] and other.  MEDICATIONS:  Current Outpatient Medications  Medication Sig Dispense Refill   allopurinol (ZYLOPRIM) 100 MG tablet Take 100 mg by mouth at bedtime.     amLODipine (NORVASC) 10 MG tablet Take 10 mg by mouth daily.     aspirin EC 81 MG EC tablet Take 1 tablet (81 mg total) by mouth daily. Swallow whole. 30 tablet 11   atorvastatin (LIPITOR) 40 MG tablet Take 40 mg by mouth daily.     baclofen (LIORESAL) 10 MG tablet Take 20 mg by mouth 3 (three) times daily as needed for muscle spasms.     Dulaglutide (TRULICITY) 1.5 JI/9.6VE SOPN Inject 1.5 mg into the skin every Monday.      ezetimibe (ZETIA) 10 MG tablet  Take 10 mg by mouth daily.     famotidine (PEPCID) 20 MG tablet Take 20 mg by mouth 2 (two) times daily.     feeding supplement, ENSURE ENLIVE, (ENSURE ENLIVE) LIQD Take 237 mLs by mouth 2 (two) times daily between meals. 237 mL 12   furosemide (LASIX) 40 MG tablet Take 40 mg by mouth daily as needed for fluid or edema.     gabapentin (NEURONTIN) 100 MG capsule Take 2 capsules (200 mg total) by mouth 4 (four) times daily. 240 capsule 3   insulin glargine, 1 Unit Dial, (TOUJEO SOLOSTAR) 300 UNIT/ML Solostar Pen Inject 20 Units into the skin at bedtime.      insulin lispro (HUMALOG) 100 UNIT/ML KwikPen Inject 5 Units into the skin 3 (three) times daily as needed (blood sugar >200). (Patient not taking: Reported on 03/09/2022)     Ipratropium-Albuterol (COMBIVENT) 20-100 MCG/ACT AERS respimat Inhale 1 puff into the lungs every  6 (six) hours as needed for wheezing or shortness of breath. 1 Inhaler 0   levothyroxine (SYNTHROID) 75 MCG tablet Take 75 mcg by mouth daily.     lidocaine-prilocaine (EMLA) cream APPLY 2-5 cms of cream TO port site 45 MINUTES - ONE HOUR prior TO access 30 g 1   losartan (COZAAR) 25 MG tablet Take 1 tablet (25 mg total) by mouth daily. 30 tablet 11   metoprolol (LOPRESSOR) 50 MG tablet Take 50 mg by mouth 2 (two) times daily.      NARCAN 4 MG/0.1ML LIQD nasal spray kit Place 1 spray into the nose as needed for opioid reversal.     Omega-3 Fatty Acids (FISH OIL PO) Take 1 capsule by mouth 2 (two) times daily.     oxyCODONE ER (XTAMPZA ER) 27 MG C12A      oxyCODONE-acetaminophen (PERCOCET) 10-325 MG tablet Take 1 tablet by mouth every 6 (six) hours as needed.     pantoprazole (PROTONIX) 40 MG tablet Take 40 mg by mouth every morning.     polyethylene glycol powder (MIRALAX) 17 GM/SCOOP powder Take 17 g by mouth 2 (two) times daily as needed for moderate constipation. 255 g 0   senna-docusate (SENOKOT-S) 8.6-50 MG tablet Take 1 tablet by mouth 2 (two) times daily between meals as  needed for mild constipation. 60 tablet 0   Tetrahydrozoline HCl (VISINE OP) Place 1 drop into both eyes 3 (three) times daily as needed (dry eyes).     No current facility-administered medications for this visit.    PHYSICAL EXAMINATION: ECOG PERFORMANCE STATUS: 1 - Symptomatic but completely ambulatory  Vitals:   05/20/22 1338  BP: (!) 134/58  Pulse: (!) 53  Resp: 17  Temp: 98.6 F (37 C)  SpO2: 97%   Wt Readings from Last 3 Encounters:  05/20/22 174 lb (78.9 kg)  04/29/22 176 lb 9.6 oz (80.1 kg)  04/15/22 171 lb (77.6 kg)     GENERAL:alert, no distress and comfortable SKIN: skin color normal, no rashes or significant lesions EYES: normal, Conjunctiva are pink and non-injected, sclera clear  NEURO: alert & oriented x 3 with fluent speech  LABORATORY DATA:  I have reviewed the data as listed    Latest Ref Rng & Units 05/20/2022    1:13 PM 05/04/2022   11:29 AM 04/29/2022    1:18 PM  CBC  WBC 4.0 - 10.5 K/uL 4.7  5.8  7.3   Hemoglobin 12.0 - 15.0 g/dL 9.2  7.8  8.2   Hematocrit 36.0 - 46.0 % 31.8  27.4  28.1   Platelets 150 - 400 K/uL 242  329  258         Latest Ref Rng & Units 05/04/2022   11:29 AM 04/29/2022    1:18 PM 04/01/2022   11:10 AM  CMP  Glucose 70 - 99 mg/dL 133  155  151   BUN 8 - 23 mg/dL _0 Creatinine 0.44 - 1.00 mg/dL 1.37  1.34  1.41   Sodium 135 - 145 mmol/L 138  138  139   Potassium 3.5 - 5.1 mmol/L 4.1  5.1  4.2   Chloride 98 - 111 mmol/L 105  107  107   CO2 22 - 32 mmol/L _1 Calcium 8.9 - 10.3 mg/dL 9.9  10.3  10.2   Total Protein 6.5 - 8.1 g/dL 6.5  6.5  6.8   Total Bilirubin 0.3 - 1.2  mg/dL 0.4  0.3  0.4   Alkaline Phos 38 - 126 U/L 163  262  181   AST 15 - 41 U/L 17  34  26   ALT 0 - 44 U/L 18  54  26       RADIOGRAPHIC STUDIES: I have personally reviewed the radiological images as listed and agreed with the findings in the report. No results found.    No orders of the defined types were placed in this  encounter.  All questions were answered. The patient knows to call the clinic with any problems, questions or concerns. No barriers to learning was detected. The total time spent in the appointment was 15 minutes.     Truitt Merle, MD 05/20/2022   Felicity Coyer, CMA, am acting as scribe for Truitt Merle, MD.   I have reviewed the above documentation for accuracy and completeness, and I agree with the above.

## 2022-05-20 ENCOUNTER — Inpatient Hospital Stay (HOSPITAL_BASED_OUTPATIENT_CLINIC_OR_DEPARTMENT_OTHER): Payer: 59 | Admitting: Hematology

## 2022-05-20 ENCOUNTER — Encounter: Payer: Self-pay | Admitting: Hematology

## 2022-05-20 ENCOUNTER — Other Ambulatory Visit: Payer: Self-pay

## 2022-05-20 ENCOUNTER — Inpatient Hospital Stay: Payer: 59 | Attending: Hematology

## 2022-05-20 VITALS — BP 134/58 | HR 53 | Temp 98.6°F | Resp 17 | Ht 65.0 in | Wt 174.0 lb

## 2022-05-20 DIAGNOSIS — Z452 Encounter for adjustment and management of vascular access device: Secondary | ICD-10-CM | POA: Insufficient documentation

## 2022-05-20 DIAGNOSIS — Q2733 Arteriovenous malformation of digestive system vessel: Secondary | ICD-10-CM | POA: Diagnosis present

## 2022-05-20 DIAGNOSIS — R918 Other nonspecific abnormal finding of lung field: Secondary | ICD-10-CM | POA: Diagnosis not present

## 2022-05-20 DIAGNOSIS — D500 Iron deficiency anemia secondary to blood loss (chronic): Secondary | ICD-10-CM | POA: Insufficient documentation

## 2022-05-20 DIAGNOSIS — F17210 Nicotine dependence, cigarettes, uncomplicated: Secondary | ICD-10-CM | POA: Insufficient documentation

## 2022-05-20 DIAGNOSIS — K5520 Angiodysplasia of colon without hemorrhage: Secondary | ICD-10-CM

## 2022-05-20 LAB — CMP (CANCER CENTER ONLY)
ALT: 40 U/L (ref 0–44)
AST: 24 U/L (ref 15–41)
Albumin: 4 g/dL (ref 3.5–5.0)
Alkaline Phosphatase: 206 U/L — ABNORMAL HIGH (ref 38–126)
Anion gap: 4 — ABNORMAL LOW (ref 5–15)
BUN: 21 mg/dL (ref 8–23)
CO2: 28 mmol/L (ref 22–32)
Calcium: 10.4 mg/dL — ABNORMAL HIGH (ref 8.9–10.3)
Chloride: 107 mmol/L (ref 98–111)
Creatinine: 1.27 mg/dL — ABNORMAL HIGH (ref 0.44–1.00)
GFR, Estimated: 45 mL/min — ABNORMAL LOW (ref >60)
Glucose, Bld: 174 mg/dL — ABNORMAL HIGH (ref 70–99)
Potassium: 4.5 mmol/L (ref 3.5–5.1)
Sodium: 139 mmol/L (ref 135–145)
Total Bilirubin: 0.3 mg/dL (ref 0.3–1.2)
Total Protein: 6.2 g/dL — ABNORMAL LOW (ref 6.5–8.1)

## 2022-05-20 LAB — CBC WITH DIFFERENTIAL (CANCER CENTER ONLY)
Abs Immature Granulocytes: 0.02 10*3/uL (ref 0.00–0.07)
Basophils Absolute: 0 10*3/uL (ref 0.0–0.1)
Basophils Relative: 0 %
Eosinophils Absolute: 0.1 10*3/uL (ref 0.0–0.5)
Eosinophils Relative: 2 %
HCT: 31.8 % — ABNORMAL LOW (ref 36.0–46.0)
Hemoglobin: 9.2 g/dL — ABNORMAL LOW (ref 12.0–15.0)
Immature Granulocytes: 0 %
Lymphocytes Relative: 16 %
Lymphs Abs: 0.8 10*3/uL (ref 0.7–4.0)
MCH: 23.4 pg — ABNORMAL LOW (ref 26.0–34.0)
MCHC: 28.9 g/dL — ABNORMAL LOW (ref 30.0–36.0)
MCV: 80.9 fL (ref 80.0–100.0)
Monocytes Absolute: 0.4 10*3/uL (ref 0.1–1.0)
Monocytes Relative: 8 %
Neutro Abs: 3.5 10*3/uL (ref 1.7–7.7)
Neutrophils Relative %: 74 %
Platelet Count: 242 10*3/uL (ref 150–400)
RBC: 3.93 MIL/uL (ref 3.87–5.11)
RDW: 21.1 % — ABNORMAL HIGH (ref 11.5–15.5)
WBC Count: 4.7 10*3/uL (ref 4.0–10.5)
nRBC: 0 % (ref 0.0–0.2)

## 2022-05-20 LAB — FERRITIN: Ferritin: 66 ng/mL (ref 11–307)

## 2022-05-20 MED ORDER — HEPARIN SOD (PORK) LOCK FLUSH 100 UNIT/ML IV SOLN
500.0000 [IU] | Freq: Once | INTRAVENOUS | Status: AC
  Administered 2022-05-20: 500 [IU]

## 2022-05-20 MED ORDER — SODIUM CHLORIDE 0.9% FLUSH
10.0000 mL | Freq: Once | INTRAVENOUS | Status: AC
  Administered 2022-05-20: 10 mL

## 2022-05-20 NOTE — Assessment & Plan Note (Signed)
-  Pt has intestinal AVM history with associated chronic GI blood loss resulting in iron deficiency and blood loss anemia. She has required blood transfusions in the past in 2013 and in 2018 and frequent hospital admission for anemia. She does not meet the diagnosis criteria for hereditary hemorrhagic telangiectasia. -Her last colonoscopy/endoscopy was 02/2019, recall 2025.  -She is not on oral iron, due to the lack of benefit.  -Currently being treated with IV Iron to prevent significant anemia with goal of Ferritin 100-200 range

## 2022-05-20 NOTE — Assessment & Plan Note (Signed)
h/o smoking intermittently for 40 years, then increased to daily after her mother passed in 2021.  -nodules initially seen on screening chest CT 07/12/19.  -Repeat 07/13/20 showed new nodules. PET scan on 07/28/20 showed mild hypermetabolism to dominant LUL nodule. Biopsy of left lung was attempted but held given difficult location. -she is now followed by Dr. Lamonte Sakai. Most recent CT 11/26/21 shows waxing and waning of pulmonary nodules. Next scheduled 05/26/22. -She continues to smoke. I again encouraged her to quit smoking.

## 2022-05-23 ENCOUNTER — Encounter: Payer: Self-pay | Admitting: Hematology

## 2022-05-24 ENCOUNTER — Other Ambulatory Visit: Payer: Self-pay

## 2022-05-24 ENCOUNTER — Ambulatory Visit: Payer: Medicare Other | Admitting: Physical Therapy

## 2022-05-24 ENCOUNTER — Telehealth: Payer: Self-pay

## 2022-05-24 NOTE — Telephone Encounter (Signed)
Called Pt Bethany [name] per Dr. Burr Medico to let pt know that her Iron Level has not reached the goal, and that the pt would need IV Iron and that she needed lab schedule for the next 3 months.The pt has confirmed that her labs are schedule for the next 3 months. Let pt know that I will be sending a message to scheduling  and the will be contacting her. I sent scheduling a message to get pt schedule for IV Ferric gluconate '250mg'$  weekly x2.

## 2022-05-25 ENCOUNTER — Encounter: Payer: Self-pay | Admitting: Hematology

## 2022-05-26 ENCOUNTER — Ambulatory Visit (HOSPITAL_BASED_OUTPATIENT_CLINIC_OR_DEPARTMENT_OTHER)
Admission: RE | Admit: 2022-05-26 | Discharge: 2022-05-26 | Disposition: A | Discharge status: Home / Self Care | Payer: 59 | Source: Ambulatory Visit | Attending: Emergency Medicine | Admitting: Emergency Medicine

## 2022-05-26 DIAGNOSIS — R918 Other nonspecific abnormal finding of lung field: Secondary | ICD-10-CM

## 2022-05-31 ENCOUNTER — Ambulatory Visit: Payer: 59 | Admitting: Physical Therapy

## 2022-06-02 ENCOUNTER — Ambulatory Visit: Payer: 59 | Admitting: Physical Therapy

## 2022-06-02 DIAGNOSIS — M542 Cervicalgia: Secondary | ICD-10-CM

## 2022-06-02 DIAGNOSIS — M6281 Muscle weakness (generalized): Secondary | ICD-10-CM

## 2022-06-02 NOTE — Therapy (Signed)
OUTPATIENT PHYSICAL THERAPY CERVICAL TREATMENT   Patient Name: Bethany [name] MRN: [id number] DOB:04/14/1951, 72 y.o., female Today's Date: 06/02/2022  END OF SESSION:  PT End of Session - 06/02/22 1538     Visit Number 3    Number of Visits 5   with eval   Date for PT Re-Evaluation 06/28/22   to allow for scheduling delay   Authorization Type [employer] Medicare    PT Start Time 1535   this therapist running behind with previous patient   PT Stop Time 1617    PT Time Calculation (min) 42 min    Activity Tolerance Patient tolerated treatment well;Patient limited by pain    Behavior During Therapy [facility] for tasks assessed/performed               Past Medical History:  Diagnosis Date   Acute renal failure ([place])    Anemia    Arthritis    COPD (chronic obstructive pulmonary disease) ([place])    Diabetes mellitus without complication ([place])    type II    GERD (gastroesophageal reflux disease)    Hypercalcemia    Hyperlipidemia    Hypertension    Pneumonia    Renal disorder    Single kidney    Stroke [facility])    July 2021   Thrombosis    Tobacco abuse    Past Surgical History:  Procedure Laterality Date   ABDOMINAL HYSTERECTOMY     blood clots removed from descending aorta      BREAST BIOPSY Right 07/28/2021   BREAST BIOPSY Right 08/17/2021   BREAST LUMPECTOMY Right 01/25/2021   BREAST LUMPECTOMY WITH RADIOACTIVE SEED LOCALIZATION Right 01/25/2021   Procedure: RIGHT BREAST LUMPECTOMY WITH RADIOACTIVE SEED LOCALIZATION;  Surgeon: Coralie Keens, MD;  Location: [place];  Service: General;  Laterality: Right;   CHOLECYSTECTOMY     COLONOSCOPY     COLONOSCOPY WITH PROPOFOL N/A 04/17/2017   Procedure: COLONOSCOPY WITH PROPOFOL;  Surgeon: Wilford Corner, MD;  [facility];  Service: Endoscopy;  Laterality: N/A;   ESOPHAGOGASTRODUODENOSCOPY (EGD) WITH PROPOFOL N/A 04/17/2017   Procedure: ESOPHAGOGASTRODUODENOSCOPY (EGD) WITH PROPOFOL;  Surgeon:  Wilford Corner, MD;  [facility];  Service: Endoscopy;  Laterality: N/A;   IR 3D INDEPENDENT WKST  05/04/2020   IR ANGIO INTRA EXTRACRAN SEL COM CAROTID INNOMINATE BILAT MOD SED  02/06/2020   IR ANGIO INTRA EXTRACRAN SEL INTERNAL CAROTID UNI L MOD SED  05/04/2020   IR ANGIO VERTEBRAL SEL VERTEBRAL BILAT MOD SED  02/06/2020   IR ANGIOGRAM FOLLOW UP STUDY  05/04/2020   IR IMAGING GUIDED PORT INSERTION  05/01/2019   IR NEURO EACH ADD'L AFTER BASIC UNI LEFT (MS)  05/04/2020   IR RADIOLOGIST EVAL & MGMT  05/28/2020   IR TRANSCATH/EMBOLIZ  05/04/2020   IR US GUIDE VASC ACCESS RIGHT  02/06/2020   IR US GUIDE VASC ACCESS RIGHT  05/04/2020   LESION REMOVAL Left 01/25/2021   Procedure: ABNORMAL SKIN LESION REMOVAL LEFT ABDOMINAL WALL;  Surgeon: Coralie Keens, MD;  Location: [place];  Service: General;  Laterality: Left;   NEPHRECTOMY RECIPIENT     ORIF ANKLE FRACTURE N/A 07/13/2021   Procedure: OPEN REDUCTION INTERNAL FIXATION (ORIF) ANKLE FRACTURE;  Surgeon: Georgeanna Harrison, MD;  [facility];  Service: Orthopedics;  Laterality: N/A;   RADIOLOGY WITH ANESTHESIA N/A 05/04/2020   Procedure: Sheppard Plumber;  Surgeon: Luanne Bras, MD;  Location: [place];  Service: Radiology;  Laterality: N/A;   Patient Active Problem  List   Diagnosis Date Noted   Chronic back pain 07/15/2021   Nondisplaced fracture of left patella 07/14/2021   Closed right ankle fracture, initial encounter 07/11/2021   Chronic rhinitis 06/22/2021   Tobacco use 04/30/2021   Multiple lung nodules 07/31/2020   Brain aneurysm 05/04/2020   Melena 12/30/2019   CVA (cerebral vascular accident) ([place]) 11/19/2019   Acute on chronic diastolic CHF (congestive heart failure) ([place])    Acute respiratory failure with hypoxia ([place]) 03/27/2019   Fever 03/27/2019   Shortness of breath 03/27/2019   Hypothyroidism 03/27/2019   Chronic GI bleeding 03/27/2019   Pleural effusion on right 03/27/2019   Acute  renal failure superimposed on stage 3b chronic kidney disease ([place]) 03/27/2019   Essential hypertension 03/27/2019   GERD (gastroesophageal reflux disease) 03/27/2019   Acute respiratory failure ([place]) 03/27/2019   Breast mass, right 06/08/2017   Liver mass 06/08/2017   Chronic kidney disease (CKD), stage III (moderate) 03/13/2017   Chronic headaches 03/13/2017   Depression 03/13/2017   Diabetes mellitus type 2 with neurological manifestations ([place]) 03/13/2017   History of thrombosis 03/13/2017   Hyperlipemia, mixed 03/13/2017   Community acquired pneumonia 03/13/2017   COPD (chronic obstructive pulmonary disease) ([place]) 03/13/2017   Pneumonia 03/13/2017   Iron deficiency anemia secondary to blood loss (chronic) 03/07/2017   GI AVM (gastrointestinal arteriovenous vascular malformation) 03/07/2017   History of colonic polyps 10/05/2016    PCP: Arthur Holms, NP  REFERRING PROVIDER: Frann Rider, NP  REFERRING DIAG: M54.2 (ICD-10-CM) - Cervicalgia G44.209 (ICD-10-CM) - Tension headache  THERAPY DIAG:  Cervicalgia  Muscle weakness (generalized)  Rationale for Evaluation and Treatment: Rehabilitation  ONSET DATE: 03/09/2022  SUBJECTIVE:                                                                                                                                                                                                         SUBJECTIVE STATEMENT: Pt reports that she was non-stop vomiting for several hours on Tuesday, felt weak on Wednesday but no vomiting. Pt reports she is feeling better today. Encouraged pt to follow up with her PCP and/or her GI doctor regarding her symptoms and frequent bouts of vomiting. Pt reports her pain was getting better in her neck and upper shoulder area and felt like her muscles were loosening up but everything feels tight again after being sick the past few days. Pt reports after last visit she threw up quite a bit on the drive home.  Pt also  reports she has an appointment next week with a doctor  in [place] to address her arthritis, possibly going to have a steroid shot in her R hip and low back. Pt also reports she is starting to have similar pain in her L hip. Encouraged pt to obtain a new PT referral if she wants other areas addressed as currently focus is her neck pain and tension headaches.   PERTINENT HISTORY:  Hx of CVA (L MCA infarct), iron deficiency anemia, CKD, arthritis, COPD, DM, GERD, HLD, HTN  PAIN:  Are you having pain? Yes: NPRS scale: 6/10 Pain location: L and R shoulders and up into neck causing tension headache; L>R Pain description: tightness, soreness, sharp pain Aggravating factors: sleeping (sleeping on R side seems to pull L side of neck) Relieving factors: time, sometimes pain medication, heat and/or ice relieves temporarily  PRECAUTIONS: None  WEIGHT BEARING RESTRICTIONS: No  FALLS:  Has patient fallen in last 6 months? Yes. Number of falls fell last February and broke ankle  LIVING ENVIRONMENT: Lives with: lives alone Lives in: House/apartment Stairs: No; elevator Has following equipment at home: Single point cane and Environmental consultant - 2 wheeled  OCCUPATION: retired  PLOF: Independent with gait and Independent with transfers  PATIENT GOALS: "find out cause of pain/tightness and relieve it"   OBJECTIVE:   VITALS:  There were no vitals filed for this visit.   Objective measures taken at evaluation unless otherwise noted or dated:  CERVICAL ROM:   Active ROM AROM (deg) eval  Flexion 20  Extension 30  Right lateral flexion 22  Left lateral flexion 15  Right rotation 46  Left rotation 35   (Blank rows = not tested)    TODAY'S TREATMENT:                                                                                                                              THER EX: Supine thoracic mobilization on towel roll, onset of pain in R shoulder at end-range Transitioned to seated anterior  leans on green therapy ball x 10 reps Seated anteriolateral leans on green therapy ball x 10 reps R/L  MANUAL THERAPY: Cervical distraction 4 x 45 sec Cervical distraction with SB 4 x 45 sec each B  THER ACT:  Trigger Point Dry-Needling  Treatment instructions: Expect mild to moderate muscle soreness. S/S of pneumothorax if dry needled over a lung field, and to seek immediate medical attention should they occur. Patient verbalized understanding of these instructions and education.  Patient Consent Given: Yes Education handout provided: Previously provided Muscles treated: L and R upper traps Treatment response/outcome: deep ache/muscle twitch in L UT    PATIENT EDUCATION:  Education details: continue HEP, TPDN Person educated: Patient Education method: Explanation and Handouts Education comprehension: verbalized understanding and needs further education  HOME EXERCISE PROGRAM: Access Code: [email or web address] URL: [email or web address] Date: 04/19/2022 Prepared by: Excell Seltzer  Exercises - Seated Upper Trapezius Stretch  - 1 x daily - 7 x weekly - 1 sets - 5  reps - 30 sec hold - Supine Suboccipital Release with Tennis Balls  - 1 x daily - 7 x weekly - 1 sets - 1 reps - 5-10 min hold - Gentle Levator Scapulae Stretch  - 1 x daily - 7 x weekly - 1 sets - 5 reps - 30 sec hold  ASSESSMENT:  CLINICAL IMPRESSION: Emphasis of skilled PT session on performing stretching, manual therapy, and TPDN to address ongoing pain and tightness in neck and shoulder region. Pt has relief of symptoms by end of session this date with no instances of nausea during session. Pt does continue to benefit from skilled therapy services to address ongoing pain limiting her functional level. Continue POC.    OBJECTIVE IMPAIRMENTS: decreased activity tolerance, decreased knowledge of condition, decreased ROM, increased fascial restrictions, impaired flexibility, improper body mechanics, postural  dysfunction, and pain.   ACTIVITY LIMITATIONS: carrying, lifting, and sleeping  PARTICIPATION LIMITATIONS: cleaning, driving, and community activity  PERSONAL FACTORS: Age, Time since onset of injury/illness/exacerbation, and 1-2 comorbidities:   Hx of CVA (L MCA infarct), iron deficiency anemia, CKD, arthritis, COPD, DM, GERD, HLD, HTN are also affecting patient's functional outcome.   REHAB POTENTIAL: Good  CLINICAL DECISION MAKING: Stable/uncomplicated  EVALUATION COMPLEXITY: Low   GOALS: Goals reviewed with patient? Yes  SHORT TERM GOALS: Target date: 05/17/2022  Pt will be independent with initial HEP for improved strength, balance, transfers and gait. Baseline: est at eval Goal status: IN PROGRESS   LONG TERM GOALS: Target date: 06/15/2022  Pt will be independent with final HEP for improved strength, balance, transfers and gait. Baseline: est at eval Goal status: INITIAL  2.  Pt will demonstrate decreased disability level as evidenced by score of </= 8/45 on NDI Baseline: 13/45 (12/5) Goal status: INITIAL  3.  Pt will improve B lateral cervical flexion by at least 10 degrees to demonstrate improved function Baseline: 22 deg R, 15 deg L (12/5) Goal status: INITIAL  4.  Pt will improve L cervical rotation by at least 10 degrees to demonstrate improved function Baseline: 35 degrees Goal status: INITIAL   PLAN:  PT FREQUENCY: 1x/week  PT DURATION:  5 total sessions (with eval)  PLANNED INTERVENTIONS: Therapeutic exercises, Therapeutic activity, Neuromuscular re-education, Balance training, Gait training, Patient/Family education, Self Care, Joint mobilization, Joint manipulation, Vestibular training, Canalith repositioning, Visual/preceptual remediation/compensation, DME instructions, Dry Needling, Electrical stimulation, Spinal manipulation, Spinal mobilization, Cryotherapy, Moist heat, Taping, Traction, Manual therapy, and Re-evaluation  PLAN FOR NEXT SESSION: DN  to UT and suboccipitals if pt not nauseous, add to HEP for postural stability and neck stretches (chin tucks, rows, cervical stretch with towel, thoracic stretch), manual therapy (distraction, suboccipital release, UT), add one more appointment   Excell Seltzer, PT, DPT, CSRS 06/02/2022, 4:18 PM

## 2022-06-03 ENCOUNTER — Ambulatory Visit: Payer: 59

## 2022-06-07 ENCOUNTER — Inpatient Hospital Stay: Payer: 59

## 2022-06-07 ENCOUNTER — Ambulatory Visit: Payer: 59 | Admitting: Physical Therapy

## 2022-06-07 DIAGNOSIS — M542 Cervicalgia: Secondary | ICD-10-CM | POA: Diagnosis not present

## 2022-06-07 DIAGNOSIS — M6281 Muscle weakness (generalized): Secondary | ICD-10-CM

## 2022-06-07 NOTE — Therapy (Signed)
OUTPATIENT PHYSICAL THERAPY CERVICAL TREATMENT   Patient Name: Bethany [name] MRN: [id number] DOB:1950-12-18, 72 y.o., female Today's Date: 06/07/2022  END OF SESSION:  PT End of Session - 06/07/22 1150     Visit Number 4    Number of Visits 5   with eval   Date for PT Re-Evaluation 06/28/22   to allow for scheduling delay   Authorization Type [employer] Medicare    PT Start Time 1150   ran over with previous session   PT Stop Time 1235    PT Time Calculation (min) 45 min    Activity Tolerance Patient tolerated treatment well    Behavior During Therapy [facility] for tasks assessed/performed                Past Medical History:  Diagnosis Date   Acute renal failure ([place])    Anemia    Arthritis    COPD (chronic obstructive pulmonary disease) ([place])    Diabetes mellitus without complication ([place])    type II    GERD (gastroesophageal reflux disease)    Hypercalcemia    Hyperlipidemia    Hypertension    Pneumonia    Renal disorder    Single kidney    Stroke [facility])    July 2021   Thrombosis    Tobacco abuse    Past Surgical History:  Procedure Laterality Date   ABDOMINAL HYSTERECTOMY     blood clots removed from descending aorta      BREAST BIOPSY Right 07/28/2021   BREAST BIOPSY Right 08/17/2021   BREAST LUMPECTOMY Right 01/25/2021   BREAST LUMPECTOMY WITH RADIOACTIVE SEED LOCALIZATION Right 01/25/2021   Procedure: RIGHT BREAST LUMPECTOMY WITH RADIOACTIVE SEED LOCALIZATION;  Surgeon: Coralie Keens, MD;  Location: [place];  Service: General;  Laterality: Right;   CHOLECYSTECTOMY     COLONOSCOPY     COLONOSCOPY WITH PROPOFOL N/A 04/17/2017   Procedure: COLONOSCOPY WITH PROPOFOL;  Surgeon: Wilford Corner, MD;  [facility];  Service: Endoscopy;  Laterality: N/A;   ESOPHAGOGASTRODUODENOSCOPY (EGD) WITH PROPOFOL N/A 04/17/2017   Procedure: ESOPHAGOGASTRODUODENOSCOPY (EGD) WITH PROPOFOL;  Surgeon: Wilford Corner, MD;  [facility];   Service: Endoscopy;  Laterality: N/A;   IR 3D INDEPENDENT WKST  05/04/2020   IR ANGIO INTRA EXTRACRAN SEL COM CAROTID INNOMINATE BILAT MOD SED  02/06/2020   IR ANGIO INTRA EXTRACRAN SEL INTERNAL CAROTID UNI L MOD SED  05/04/2020   IR ANGIO VERTEBRAL SEL VERTEBRAL BILAT MOD SED  02/06/2020   IR ANGIOGRAM FOLLOW UP STUDY  05/04/2020   IR IMAGING GUIDED PORT INSERTION  05/01/2019   IR NEURO EACH ADD'L AFTER BASIC UNI LEFT (MS)  05/04/2020   IR RADIOLOGIST EVAL & MGMT  05/28/2020   IR TRANSCATH/EMBOLIZ  05/04/2020   IR US GUIDE VASC ACCESS RIGHT  02/06/2020   IR US GUIDE VASC ACCESS RIGHT  05/04/2020   LESION REMOVAL Left 01/25/2021   Procedure: ABNORMAL SKIN LESION REMOVAL LEFT ABDOMINAL WALL;  Surgeon: Coralie Keens, MD;  Location: [place];  Service: General;  Laterality: Left;   NEPHRECTOMY RECIPIENT     ORIF ANKLE FRACTURE N/A 07/13/2021   Procedure: OPEN REDUCTION INTERNAL FIXATION (ORIF) ANKLE FRACTURE;  Surgeon: Georgeanna Harrison, MD;  [facility];  Service: Orthopedics;  Laterality: N/A;   RADIOLOGY WITH ANESTHESIA N/A 05/04/2020   Procedure: Sheppard Plumber;  Surgeon: Luanne Bras, MD;  Location: [place];  Service: Radiology;  Laterality: N/A;   Patient Active Problem List   Diagnosis  Date Noted   Chronic back pain 07/15/2021   Nondisplaced fracture of left patella 07/14/2021   Closed right ankle fracture, initial encounter 07/11/2021   Chronic rhinitis 06/22/2021   Tobacco use 04/30/2021   Multiple lung nodules 07/31/2020   Brain aneurysm 05/04/2020   Melena 12/30/2019   CVA (cerebral vascular accident) ([place]) 11/19/2019   Acute on chronic diastolic CHF (congestive heart failure) ([place])    Acute respiratory failure with hypoxia ([place]) 03/27/2019   Fever 03/27/2019   Shortness of breath 03/27/2019   Hypothyroidism 03/27/2019   Chronic GI bleeding 03/27/2019   Pleural effusion on right 03/27/2019   Acute renal failure superimposed on stage 3b chronic  kidney disease ([place]) 03/27/2019   Essential hypertension 03/27/2019   GERD (gastroesophageal reflux disease) 03/27/2019   Acute respiratory failure ([place]) 03/27/2019   Breast mass, right 06/08/2017   Liver mass 06/08/2017   Chronic kidney disease (CKD), stage III (moderate) 03/13/2017   Chronic headaches 03/13/2017   Depression 03/13/2017   Diabetes mellitus type 2 with neurological manifestations ([place]) 03/13/2017   History of thrombosis 03/13/2017   Hyperlipemia, mixed 03/13/2017   Community acquired pneumonia 03/13/2017   COPD (chronic obstructive pulmonary disease) ([place]) 03/13/2017   Pneumonia 03/13/2017   Iron deficiency anemia secondary to blood loss (chronic) 03/07/2017   GI AVM (gastrointestinal arteriovenous vascular malformation) 03/07/2017   History of colonic polyps 10/05/2016    PCP: Arthur Holms, NP  REFERRING PROVIDER: Frann Rider, NP  REFERRING DIAG: M54.2 (ICD-10-CM) - Cervicalgia G44.209 (ICD-10-CM) - Tension headache  THERAPY DIAG:  Cervicalgia  Muscle weakness (generalized)  Rationale for Evaluation and Treatment: Rehabilitation  ONSET DATE: 03/09/2022  SUBJECTIVE:                                                                                                                                                                                                         SUBJECTIVE STATEMENT: Pt reports "I have been feeling good" with no vomiting since last session. Pt also reports she has been sleeping on an air mattress and it is cold, making her arthritis worse. Pt reports her neck pain tightness was improved until today, it is feeling more tight today during PT session.   PERTINENT HISTORY:  Hx of CVA (L MCA infarct), iron deficiency anemia, CKD, arthritis, COPD, DM, GERD, HLD, HTN  PAIN:  Are you having pain? Yes: NPRS scale: 8/10 Pain location: L and R shoulders and up into neck causing tension headache; L>R Pain description: tightness, soreness, sharp  pain Aggravating factors: sleeping (sleeping on R side seems to  pull L side of neck) Relieving factors: time, sometimes pain medication, heat and/or ice relieves temporarily  PRECAUTIONS: None  WEIGHT BEARING RESTRICTIONS: No  FALLS:  Has patient fallen in last 6 months? Yes. Number of falls fell last February and broke ankle  LIVING ENVIRONMENT: Lives with: lives alone Lives in: House/apartment Stairs: No; elevator Has following equipment at home: Single point cane and Environmental consultant - 2 wheeled  OCCUPATION: retired  PLOF: Independent with gait and Independent with transfers  PATIENT GOALS: "find out cause of pain/tightness and relieve it"   OBJECTIVE:   VITALS:  There were no vitals filed for this visit.   Objective measures taken at evaluation unless otherwise noted or dated:  CERVICAL ROM:   Active ROM AROM (deg) eval  Flexion 20  Extension 30  Right lateral flexion 22  Left lateral flexion 15  Right rotation 46  Left rotation 35   (Blank rows = not tested)    TODAY'S TREATMENT:                                                                                                                              THER EX: Seated assisted cervical rotation with towel 3 x 30 sec each B Seated cervical retractions 2 x 10 reps  Added to HEP, see bolded below   THER ACT:  Trigger Point Dry-Needling  Treatment instructions: Expect mild to moderate muscle soreness. S/S of pneumothorax if dry needled over a lung field, and to seek immediate medical attention should they occur. Patient verbalized understanding of these instructions and education.  Patient Consent Given: Yes Education handout provided: Previously provided Muscles treated: L and R upper traps Treatment response/outcome: deep ache/muscle twitch in B UT, relief of pain/tightness in B UT following treatment   PATIENT EDUCATION:  Education details: continue HEP, added to HEP, TPDN Person educated:  Patient Education method: Explanation and Handouts Education comprehension: verbalized understanding and needs further education  HOME EXERCISE PROGRAM: Access Code: [email or web address] URL: [email or web address] Date: 04/19/2022 Prepared by: Excell Seltzer  Exercises - Seated Upper Trapezius Stretch  - 1 x daily - 7 x weekly - 1 sets - 5 reps - 30 sec hold - Supine Suboccipital Release with Tennis Balls  - 1 x daily - 7 x weekly - 1 sets - 1 reps - 5-10 min hold - Gentle Levator Scapulae Stretch  - 1 x daily - 7 x weekly - 1 sets - 5 reps - 30 sec hold - Seated Assisted Cervical Rotation with Towel  - 1 x daily - 7 x weekly - 1 sets - 5 reps - 30 sec hold - Seated Cervical Retraction  - 1 x daily - 7 x weekly - 3 sets - 10 reps  ASSESSMENT:  CLINICAL IMPRESSION: Emphasis of skilled PT session on performing stretching, strengthening, and TPDN to address ongoing pain and tightness in neck and shoulder region. Pt has decrease in pain and tightness in her upper traps  following treatment this date. Pt continues to benefit from skilled therapy services to address ongoing pain and tightness at times leading to decreased functional level. Continue POC.    OBJECTIVE IMPAIRMENTS: decreased activity tolerance, decreased knowledge of condition, decreased ROM, increased fascial restrictions, impaired flexibility, improper body mechanics, postural dysfunction, and pain.   ACTIVITY LIMITATIONS: carrying, lifting, and sleeping  PARTICIPATION LIMITATIONS: cleaning, driving, and community activity  PERSONAL FACTORS: Age, Time since onset of injury/illness/exacerbation, and 1-2 comorbidities:   Hx of CVA (L MCA infarct), iron deficiency anemia, CKD, arthritis, COPD, DM, GERD, HLD, HTN are also affecting patient's functional outcome.   REHAB POTENTIAL: Good  CLINICAL DECISION MAKING: Stable/uncomplicated  EVALUATION COMPLEXITY: Low   GOALS: Goals reviewed with patient? Yes  SHORT TERM  GOALS: Target date: 05/17/2022  Pt will be independent with initial HEP for improved strength, balance, transfers and gait. Baseline: est at eval Goal status: IN PROGRESS   LONG TERM GOALS: Target date: 06/15/2022  Pt will be independent with final HEP for improved strength, balance, transfers and gait. Baseline: est at eval Goal status: INITIAL  2.  Pt will demonstrate decreased disability level as evidenced by score of </= 8/45 on NDI Baseline: 13/45 (12/5) Goal status: INITIAL  3.  Pt will improve B lateral cervical flexion by at least 10 degrees to demonstrate improved function Baseline: 22 deg R, 15 deg L (12/5) Goal status: INITIAL  4.  Pt will improve L cervical rotation by at least 10 degrees to demonstrate improved function Baseline: 35 degrees Goal status: INITIAL   PLAN:  PT FREQUENCY: 1x/week  PT DURATION:  5 total sessions (with eval)  PLANNED INTERVENTIONS: Therapeutic exercises, Therapeutic activity, Neuromuscular re-education, Balance training, Gait training, Patient/Family education, Self Care, Joint mobilization, Joint manipulation, Vestibular training, Canalith repositioning, Visual/preceptual remediation/compensation, DME instructions, Dry Needling, Electrical stimulation, Spinal manipulation, Spinal mobilization, Cryotherapy, Moist heat, Taping, Traction, Manual therapy, and Re-evaluation  PLAN FOR NEXT SESSION: DN to UT and suboccipitals if pt not nauseous, assess LTG and d/c from PT   Excell Seltzer, PT, DPT, CSRS 06/07/2022, 12:35 PM

## 2022-06-10 ENCOUNTER — Other Ambulatory Visit: Payer: Self-pay

## 2022-06-10 ENCOUNTER — Inpatient Hospital Stay: Payer: 59

## 2022-06-10 VITALS — BP 133/59 | HR 56 | Temp 99.2°F | Resp 18

## 2022-06-10 DIAGNOSIS — K5520 Angiodysplasia of colon without hemorrhage: Secondary | ICD-10-CM

## 2022-06-10 DIAGNOSIS — Q2733 Arteriovenous malformation of digestive system vessel: Secondary | ICD-10-CM | POA: Diagnosis not present

## 2022-06-10 DIAGNOSIS — D500 Iron deficiency anemia secondary to blood loss (chronic): Secondary | ICD-10-CM

## 2022-06-10 MED ORDER — EPINEPHRINE 0.3 MG/0.3ML IJ SOAJ: 0.3000 mg | Freq: Once | INTRAMUSCULAR | Status: DC | PRN

## 2022-06-10 MED ORDER — ALBUTEROL SULFATE HFA 108 (90 BASE) MCG/ACT IN AERS: 2.0000 | INHALATION_SPRAY | Freq: Once | RESPIRATORY_TRACT | Status: DC | PRN

## 2022-06-10 MED ORDER — SODIUM CHLORIDE 0.9 % IV SOLN: Freq: Once | INTRAVENOUS | Status: AC

## 2022-06-10 MED ORDER — SODIUM CHLORIDE 0.9 % IV SOLN
250.0000 mg | INTRAVENOUS | Status: DC
  Administered 2022-06-10: 250 mg via INTRAVENOUS
  Filled 2022-06-10: qty 250

## 2022-06-10 MED ORDER — METHYLPREDNISOLONE SODIUM SUCC 125 MG IJ SOLR: 125.0000 mg | Freq: Once | INTRAMUSCULAR | Status: DC | PRN

## 2022-06-10 MED ORDER — LORATADINE 10 MG PO TABS
10.0000 mg | ORAL_TABLET | Freq: Once | ORAL | Status: AC
  Administered 2022-06-10: 10 mg via ORAL
  Filled 2022-06-10: qty 1

## 2022-06-10 MED ORDER — ACETAMINOPHEN 325 MG PO TABS
650.0000 mg | ORAL_TABLET | Freq: Once | ORAL | Status: AC
  Administered 2022-06-10: 650 mg via ORAL
  Filled 2022-06-10: qty 2

## 2022-06-10 MED ORDER — SODIUM CHLORIDE 0.9% FLUSH
10.0000 mL | Freq: Once | INTRAVENOUS | Status: AC
  Administered 2022-06-10: 10 mL

## 2022-06-10 MED ORDER — DIPHENHYDRAMINE HCL 50 MG/ML IJ SOLN: 50.0000 mg | Freq: Once | INTRAMUSCULAR | Status: DC | PRN

## 2022-06-10 MED ORDER — SODIUM CHLORIDE 0.9 % IV SOLN: Freq: Once | INTRAVENOUS | Status: DC | PRN

## 2022-06-10 MED ORDER — HEPARIN SOD (PORK) LOCK FLUSH 100 UNIT/ML IV SOLN
500.0000 [IU] | Freq: Once | INTRAVENOUS | Status: AC
  Administered 2022-06-10: 500 [IU]

## 2022-06-10 MED ORDER — FAMOTIDINE IN NACL 20-0.9 MG/50ML-% IV SOLN: 20.0000 mg | Freq: Once | INTRAVENOUS | Status: DC | PRN

## 2022-06-10 NOTE — Patient Instructions (Signed)
Sodium Ferric Gluconate Complex Injection What is this medication? SODIUM FERRIC GLUCONATE COMPLEX (SOE dee um FER ik GLOO koe nate KOM pleks) treats low levels of iron (iron deficiency anemia) in people with kidney disease. Iron is a mineral that plays an important role in making red blood cells, which carry oxygen from your lungs to the rest of your body. This medicine may be used for other purposes; ask your health care provider or pharmacist if you have questions. COMMON BRAND NAME(S): Ferrlecit, Nulecit What should I tell my care team before I take this medication? They need to know if you have any of the following conditions: Anemia that is not from iron deficiency High levels of iron in the blood An unusual or allergic reaction to iron, other medications, foods, dyes, or preservatives Pregnant or are trying to become pregnant Breast-feeding How should I use this medication? This medication is injected into a vein. It is given by your care team in a hospital or clinic setting. Talk to your care team about the use of this medication in children. While it may be prescribed for children as young as 6 years for selected conditions, precautions do apply. Overdosage: If you think you have taken too much of this medicine contact a poison control center or emergency room at once. NOTE: This medicine is only for you. Do not share this medicine with others. What if I miss a dose? It is important not to miss your dose. Call your care team if you are unable to keep an appointment. What may interact with this medication? Do not take this medication with any of the following: Deferasirox Deferoxamine Dimercaprol This medication may also interact with the following: Other iron products This list may not describe all possible interactions. Give your health care provider a list of all the medicines, herbs, non-prescription drugs, or dietary supplements you use. Also tell them if you smoke, drink  alcohol, or use illegal drugs. Some items may interact with your medicine. What should I watch for while using this medication? Your condition will be monitored carefully while you are receiving this medication. Visit your care team for regular checks on your progress. You may need blood work while you are taking this medication. What side effects may I notice from receiving this medication? Side effects that you should report to your care team as soon as possible: Allergic reactions--skin rash, itching, hives, swelling of the face, lips, tongue, or throat Low blood pressure--dizziness, feeling faint or lightheaded, blurry vision Shortness of breath Side effects that usually do not require medical attention (report to your care team if they continue or are bothersome): Flushing Headache Joint pain Muscle pain Nausea Pain, redness, or irritation at injection site This list may not describe all possible side effects. Call your doctor for medical advice about side effects. You may report side effects to FDA at 1-800-FDA-1088. Where should I keep my medication? This medication is given in a hospital or clinic and will not be stored at home. NOTE: This sheet is a summary. It may not cover all possible information. If you have questions about this medicine, talk to your doctor, pharmacist, or health care provider.  2023 Elsevier/Gold Standard (2007-06-23 00:00:00) 

## 2022-06-21 ENCOUNTER — Ambulatory Visit: Payer: 59 | Attending: Registered Nurse | Admitting: Physical Therapy

## 2022-06-21 ENCOUNTER — Other Ambulatory Visit: Payer: Self-pay

## 2022-06-21 DIAGNOSIS — M6281 Muscle weakness (generalized): Secondary | ICD-10-CM

## 2022-06-21 DIAGNOSIS — M542 Cervicalgia: Secondary | ICD-10-CM | POA: Diagnosis present

## 2022-06-21 DIAGNOSIS — R918 Other nonspecific abnormal finding of lung field: Secondary | ICD-10-CM

## 2022-06-21 NOTE — Therapy (Signed)
OUTPATIENT PHYSICAL THERAPY CERVICAL TREATMENT-DISCHARGE NOTE   Patient Name: Bethany [name] MRN: [id number] DOB:11-21-1950, 72 y.o., female Today's Date: 06/21/2022  END OF SESSION:  PT End of Session - 06/21/22 1313     Visit Number 5    Number of Visits 5   with eval   Date for PT Re-Evaluation 06/28/22   to allow for scheduling delay   Authorization Type [employer] Medicare    PT Start Time 7124    PT Stop Time 1347   d/c   PT Time Calculation (min) 34 min    Activity Tolerance Patient tolerated treatment well    Behavior During Therapy [facility] for tasks assessed/performed                PHYSICAL THERAPY DISCHARGE SUMMARY  Visits from Start of Care: 5  Current functional level related to goals / functional outcomes: Independent    Remaining deficits: Ongoing pain and decreased function   Education / Equipment: Handout for HEP and for theracane   Patient agrees to discharge. Patient goals were partially met. Patient is being discharged due to maximized rehab potential.   Past Medical History:  Diagnosis Date   Acute renal failure ([place])    Anemia    Arthritis    COPD (chronic obstructive pulmonary disease) ([facility])    Diabetes mellitus without complication ([facility])    type II    GERD (gastroesophageal reflux disease)    Hypercalcemia    Hyperlipidemia    Hypertension    Pneumonia    Renal disorder    Single kidney    Stroke [facility])    July 2021   Thrombosis    Tobacco abuse    Past Surgical History:  Procedure Laterality Date   ABDOMINAL HYSTERECTOMY     blood clots removed from descending aorta      BREAST BIOPSY Right 07/28/2021   BREAST BIOPSY Right 08/17/2021   BREAST LUMPECTOMY Right 01/25/2021   BREAST LUMPECTOMY WITH RADIOACTIVE SEED LOCALIZATION Right 01/25/2021   Procedure: RIGHT BREAST LUMPECTOMY WITH RADIOACTIVE SEED LOCALIZATION;  Surgeon: Coralie Keens, MD;  Location: [place];  Service: General;  Laterality: Right;    CHOLECYSTECTOMY     COLONOSCOPY     COLONOSCOPY WITH PROPOFOL N/A 04/17/2017   Procedure: COLONOSCOPY WITH PROPOFOL;  Surgeon: Wilford Corner, MD;  [facility];  Service: Endoscopy;  Laterality: N/A;   ESOPHAGOGASTRODUODENOSCOPY (EGD) WITH PROPOFOL N/A 04/17/2017   Procedure: ESOPHAGOGASTRODUODENOSCOPY (EGD) WITH PROPOFOL;  Surgeon: Wilford Corner, MD;  [facility];  Service: Endoscopy;  Laterality: N/A;   IR 3D INDEPENDENT WKST  05/04/2020   IR ANGIO INTRA EXTRACRAN SEL COM CAROTID INNOMINATE BILAT MOD SED  02/06/2020   IR ANGIO INTRA EXTRACRAN SEL INTERNAL CAROTID UNI L MOD SED  05/04/2020   IR ANGIO VERTEBRAL SEL VERTEBRAL BILAT MOD SED  02/06/2020   IR ANGIOGRAM FOLLOW UP STUDY  05/04/2020   IR IMAGING GUIDED PORT INSERTION  05/01/2019   IR NEURO EACH ADD'L AFTER BASIC UNI LEFT (MS)  05/04/2020   IR RADIOLOGIST EVAL & MGMT  05/28/2020   IR TRANSCATH/EMBOLIZ  05/04/2020   IR US GUIDE VASC ACCESS RIGHT  02/06/2020   IR US GUIDE VASC ACCESS RIGHT  05/04/2020   LESION REMOVAL Left 01/25/2021   Procedure: ABNORMAL SKIN LESION REMOVAL LEFT ABDOMINAL WALL;  Surgeon: Coralie Keens, MD;  Location: [place];  Service: General;  Laterality: Left;   NEPHRECTOMY RECIPIENT     ORIF ANKLE FRACTURE  N/A 07/13/2021   Procedure: OPEN REDUCTION INTERNAL FIXATION (ORIF) ANKLE FRACTURE;  Surgeon: Georgeanna Harrison, MD;  [facility];  Service: Orthopedics;  Laterality: N/A;   RADIOLOGY WITH ANESTHESIA N/A 05/04/2020   Procedure: Sheppard Plumber;  Surgeon: Luanne Bras, MD;  Location: [place];  Service: Radiology;  Laterality: N/A;   Patient Active Problem List   Diagnosis Date Noted   Chronic back pain 07/15/2021   Nondisplaced fracture of left patella 07/14/2021   Closed right ankle fracture, initial encounter 07/11/2021   Chronic rhinitis 06/22/2021   Tobacco use 04/30/2021   Multiple lung nodules 07/31/2020   Brain aneurysm 05/04/2020   Melena  12/30/2019   CVA (cerebral vascular accident) ([place]) 11/19/2019   Acute on chronic diastolic CHF (congestive heart failure) ([place])    Acute respiratory failure with hypoxia ([place]) 03/27/2019   Fever 03/27/2019   Shortness of breath 03/27/2019   Hypothyroidism 03/27/2019   Chronic GI bleeding 03/27/2019   Pleural effusion on right 03/27/2019   Acute renal failure superimposed on stage 3b chronic kidney disease ([place]) 03/27/2019   Essential hypertension 03/27/2019   GERD (gastroesophageal reflux disease) 03/27/2019   Acute respiratory failure ([place]) 03/27/2019   Breast mass, right 06/08/2017   Liver mass 06/08/2017   Chronic kidney disease (CKD), stage III (moderate) 03/13/2017   Chronic headaches 03/13/2017   Depression 03/13/2017   Diabetes mellitus type 2 with neurological manifestations ([place]) 03/13/2017   History of thrombosis 03/13/2017   Hyperlipemia, mixed 03/13/2017   Community acquired pneumonia 03/13/2017   COPD (chronic obstructive pulmonary disease) ([place]) 03/13/2017   Pneumonia 03/13/2017   Iron deficiency anemia secondary to blood loss (chronic) 03/07/2017   GI AVM (gastrointestinal arteriovenous vascular malformation) 03/07/2017   History of colonic polyps 10/05/2016    PCP: Arthur Holms, NP  REFERRING PROVIDER: Frann Rider, NP  REFERRING DIAG: M54.2 (ICD-10-CM) - Cervicalgia G44.209 (ICD-10-CM) - Tension headache  THERAPY DIAG:  Cervicalgia  Muscle weakness (generalized)  Rationale for Evaluation and Treatment: Rehabilitation  ONSET DATE: 03/09/2022  SUBJECTIVE:                                                                                                                                                                                                         SUBJECTIVE STATEMENT: Pt reports pain in her neck has been improved overall, just flares up when she is stressed. Pain is 5/10 today, has taken all her meds today which helps. Pt reports the HEP is helpful  when she does it.   PERTINENT HISTORY:  Hx of CVA (L MCA infarct),  iron deficiency anemia, CKD, arthritis, COPD, DM, GERD, HLD, HTN  PAIN:  Are you having pain? Yes: NPRS scale: 5/10 Pain location: L and R shoulders and up into neck causing tension headache; L>R Pain description: tightness, soreness, sharp pain Aggravating factors: sleeping (sleeping on R side seems to pull L side of neck) Relieving factors: time, sometimes pain medication, heat and/or ice relieves temporarily  PRECAUTIONS: None  WEIGHT BEARING RESTRICTIONS: No  FALLS:  Has patient fallen in last 6 months? Yes. Number of falls fell last February and broke ankle  LIVING ENVIRONMENT: Lives with: lives alone Lives in: House/apartment Stairs: No; elevator Has following equipment at home: Single point cane and Environmental consultant - 2 wheeled  OCCUPATION: retired  PLOF: Independent with gait and Independent with transfers  PATIENT GOALS: "find out cause of pain/tightness and relieve it"   OBJECTIVE:   VITALS:  There were no vitals filed for this visit.   Objective measures taken at evaluation unless otherwise noted or dated:  CERVICAL ROM:   Active ROM AROM (deg) eval AROM (deg) 06/21/22  Flexion 20   Extension 30   Right lateral flexion 22 30  Left lateral flexion 15 34  Right rotation 46   Left rotation 35 35   (Blank rows = not tested)    TODAY'S TREATMENT:                                                                                                                               THER ACT: Demonstrated use of theracane to address ongoing trigger points. Pt able to perform return demo. Provided handout for where to purchase device.  Reassessed cervical ROM (see above)  Reassessed NDI:17/45  Trigger Point Dry-Needling  Treatment instructions: Expect mild to moderate muscle soreness. S/S of pneumothorax if dry needled over a lung field, and to seek immediate medical attention should they occur. Patient  verbalized understanding of these instructions and education.  Patient Consent Given: Yes Education handout provided: Previously provided Muscles treated: L and R upper traps Treatment response/outcome: deep ache/muscle twitch in B UT, relief of pain/tightness in B UT following treatment   PATIENT EDUCATION:  Education details: continue HEP, d/c from PT, theracane Person educated: Patient Education method: Explanation and Handouts Education comprehension: verbalized understanding and returned demonstration  HOME EXERCISE PROGRAM: Access Code: [email or web address] URL: [email or web address] Date: 04/19/2022 Prepared by: Excell Seltzer  Exercises - Seated Upper Trapezius Stretch  - 1 x daily - 7 x weekly - 1 sets - 5 reps - 30 sec hold - Supine Suboccipital Release with Tennis Balls  - 1 x daily - 7 x weekly - 1 sets - 1 reps - 5-10 min hold - Gentle Levator Scapulae Stretch  - 1 x daily - 7 x weekly - 1 sets - 5 reps - 30 sec hold - Seated Assisted Cervical Rotation with Towel  - 1 x daily - 7 x weekly - 1 sets - 5 reps -  30 sec hold - Seated Cervical Retraction  - 1 x daily - 7 x weekly - 3 sets - 10 reps   ASSESSMENT:  CLINICAL IMPRESSION: Emphasis of skilled PT session on performing TPDN to address ongoing pain/tightness in UT, demonstrating use of theracane to address trigger points, and reassessing LTG in preparation for d/c from PT services this date. Pt has partially met 2/4 goals due to being independent with her final HEP and improving her lateral cervical flexion from 22 degrees on the R to 30 degrees this date and from 15 degrees to 34 degrees on the L. However, pt showed no change in her L cervical rotation and increased disability this date as indicated by her NDI score of 17/45. Overall, pt feels that her pain has improved and has maximized her rehab potential at this point. Pt to continue with her HEP and use of theracane to address flare-ups of neck and/or shoulder  pain.   OBJECTIVE IMPAIRMENTS: decreased activity tolerance, decreased knowledge of condition, decreased ROM, increased fascial restrictions, impaired flexibility, improper body mechanics, postural dysfunction, and pain.   ACTIVITY LIMITATIONS: carrying, lifting, and sleeping  PARTICIPATION LIMITATIONS: cleaning, driving, and community activity  PERSONAL FACTORS: Age, Time since onset of injury/illness/exacerbation, and 1-2 comorbidities:   Hx of CVA (L MCA infarct), iron deficiency anemia, CKD, arthritis, COPD, DM, GERD, HLD, HTN are also affecting patient's functional outcome.   REHAB POTENTIAL: Good  CLINICAL DECISION MAKING: Stable/uncomplicated  EVALUATION COMPLEXITY: Low   GOALS: Goals reviewed with patient? Yes  SHORT TERM GOALS: Target date: 05/17/2022  Pt will be independent with initial HEP for improved strength, balance, transfers and gait. Baseline: est at eval Goal status: IN PROGRESS   LONG TERM GOALS: Target date: 06/15/2022  Pt will be independent with final HEP for improved strength, balance, transfers and gait. Baseline: est at eval Goal status: MET  2.  Pt will demonstrate decreased disability level as evidenced by score of </= 8/45 on NDI Baseline: 13/45 (12/5), 17/45 (2/6) Goal status: NOT MET  3.  Pt will improve B lateral cervical flexion by at least 10 degrees to demonstrate improved function Baseline: 22 deg R, 15 deg L (12/5); 30 deg R, 34 deg L (2/6) Goal status: PARTIALLY MET  4.  Pt will improve L cervical rotation by at least 10 degrees to demonstrate improved function Baseline: 35 degrees, 35 degrees (2/6) Goal status: NOT MET    Excell Seltzer, PT, DPT, CSRS 06/21/2022, 1:49 PM

## 2022-06-22 ENCOUNTER — Inpatient Hospital Stay: Payer: 59

## 2022-06-22 ENCOUNTER — Other Ambulatory Visit: Payer: Self-pay

## 2022-06-22 ENCOUNTER — Inpatient Hospital Stay: Payer: 59 | Attending: Hematology

## 2022-06-22 DIAGNOSIS — D500 Iron deficiency anemia secondary to blood loss (chronic): Secondary | ICD-10-CM | POA: Diagnosis present

## 2022-06-22 DIAGNOSIS — Z452 Encounter for adjustment and management of vascular access device: Secondary | ICD-10-CM | POA: Insufficient documentation

## 2022-06-22 DIAGNOSIS — Q2733 Arteriovenous malformation of digestive system vessel: Secondary | ICD-10-CM | POA: Insufficient documentation

## 2022-06-22 DIAGNOSIS — K5520 Angiodysplasia of colon without hemorrhage: Secondary | ICD-10-CM

## 2022-06-22 LAB — CMP (CANCER CENTER ONLY)
ALT: 38 U/L (ref 0–44)
AST: 29 U/L (ref 15–41)
Albumin: 3.7 g/dL (ref 3.5–5.0)
Alkaline Phosphatase: 193 U/L — ABNORMAL HIGH (ref 38–126)
Anion gap: 5 (ref 5–15)
BUN: 14 mg/dL (ref 8–23)
CO2: 27 mmol/L (ref 22–32)
Calcium: 10 mg/dL (ref 8.9–10.3)
Chloride: 102 mmol/L (ref 98–111)
Creatinine: 1.1 mg/dL — ABNORMAL HIGH (ref 0.44–1.00)
GFR, Estimated: 53 mL/min — ABNORMAL LOW (ref >60)
Glucose, Bld: 198 mg/dL — ABNORMAL HIGH (ref 70–99)
Potassium: 4.9 mmol/L (ref 3.5–5.1)
Sodium: 134 mmol/L — ABNORMAL LOW (ref 135–145)
Total Bilirubin: 0.3 mg/dL (ref 0.3–1.2)
Total Protein: 6.1 g/dL — ABNORMAL LOW (ref 6.5–8.1)

## 2022-06-22 LAB — CBC WITH DIFFERENTIAL (CANCER CENTER ONLY)
Abs Immature Granulocytes: 0.02 10*3/uL (ref 0.00–0.07)
Basophils Absolute: 0 10*3/uL (ref 0.0–0.1)
Basophils Relative: 0 %
Eosinophils Absolute: 0.1 10*3/uL (ref 0.0–0.5)
Eosinophils Relative: 2 %
HCT: 29.8 % — ABNORMAL LOW (ref 36.0–46.0)
Hemoglobin: 8.8 g/dL — ABNORMAL LOW (ref 12.0–15.0)
Immature Granulocytes: 0 %
Lymphocytes Relative: 15 %
Lymphs Abs: 1 10*3/uL (ref 0.7–4.0)
MCH: 23.1 pg — ABNORMAL LOW (ref 26.0–34.0)
MCHC: 29.5 g/dL — ABNORMAL LOW (ref 30.0–36.0)
MCV: 78.2 fL — ABNORMAL LOW (ref 80.0–100.0)
Monocytes Absolute: 0.5 10*3/uL (ref 0.1–1.0)
Monocytes Relative: 7 %
Neutro Abs: 5.2 10*3/uL (ref 1.7–7.7)
Neutrophils Relative %: 76 %
Platelet Count: 233 10*3/uL (ref 150–400)
RBC: 3.81 MIL/uL — ABNORMAL LOW (ref 3.87–5.11)
RDW: 18.6 % — ABNORMAL HIGH (ref 11.5–15.5)
WBC Count: 6.9 10*3/uL (ref 4.0–10.5)
nRBC: 0 % (ref 0.0–0.2)

## 2022-06-22 LAB — FERRITIN: Ferritin: 50 ng/mL (ref 11–307)

## 2022-06-22 MED ORDER — SODIUM CHLORIDE 0.9% FLUSH
10.0000 mL | Freq: Once | INTRAVENOUS | Status: AC
  Administered 2022-06-22: 10 mL

## 2022-06-22 MED ORDER — HEPARIN SOD (PORK) LOCK FLUSH 100 UNIT/ML IV SOLN
500.0000 [IU] | Freq: Once | INTRAVENOUS | Status: AC
  Administered 2022-06-22: 500 [IU]

## 2022-06-25 IMAGING — US US BREAST*R* LIMITED INC AXILLA
1 series · 7 of 7 positions shown · non-contrast
Comparison: Previous exams including RIGHT breast ultrasound dated
07/28/2021.
COMPARISON: Previous exams including RIGHT breast ultrasound dated
07/28/2021.

Addendum:
CLINICAL DATA: Follow-up for a mass/abscess in the retroareolar
RIGHT breast after completion of a 10 day course of Augmentin.

[Series 1: us breast*right* limited inc axilla · 0.07mm/px · 7 acquisitions, 7 frames shown]
[im 1/7]
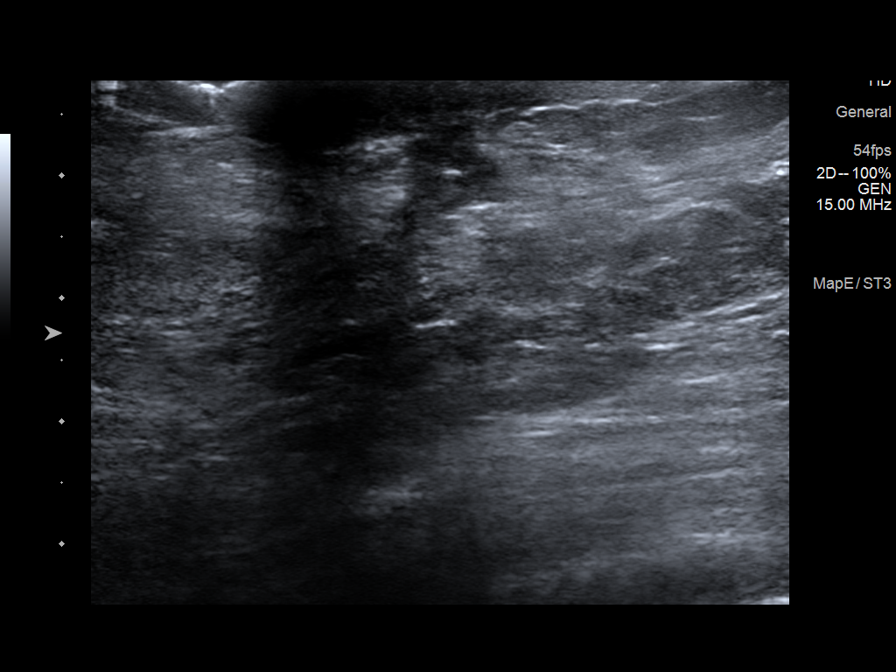
[im 2/7]
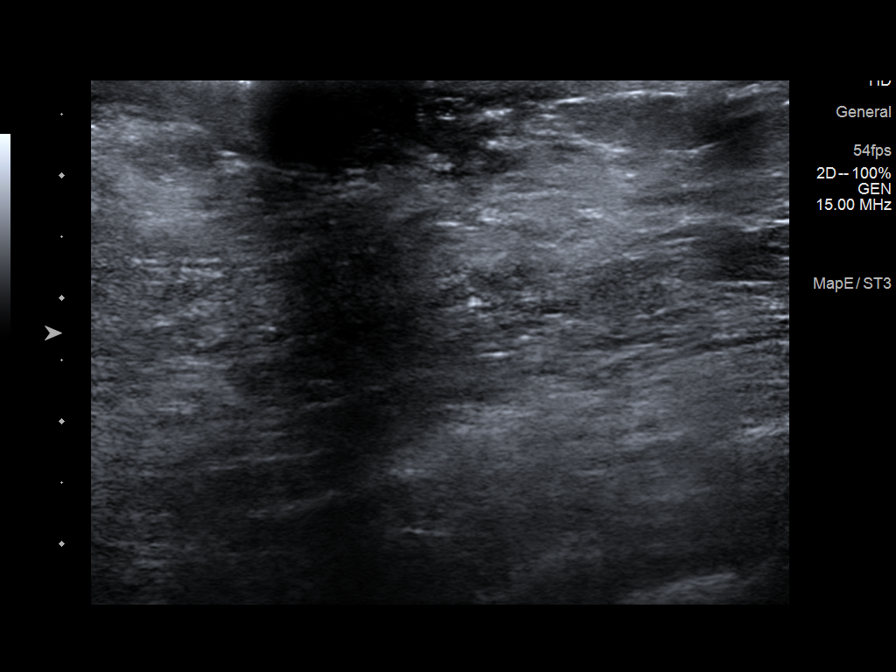
[im 3/7]
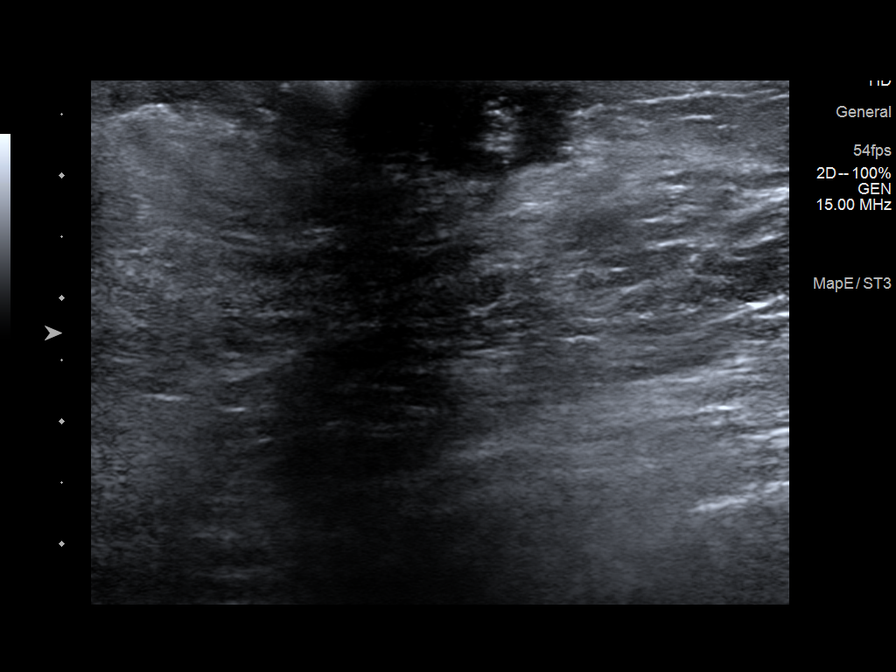
[im 4/7]
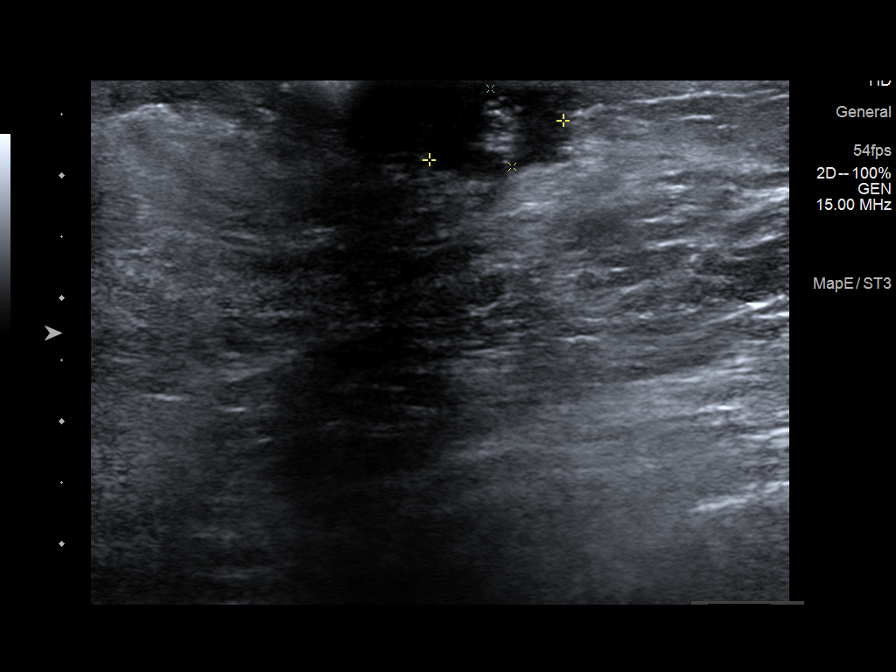
[im 5/7]
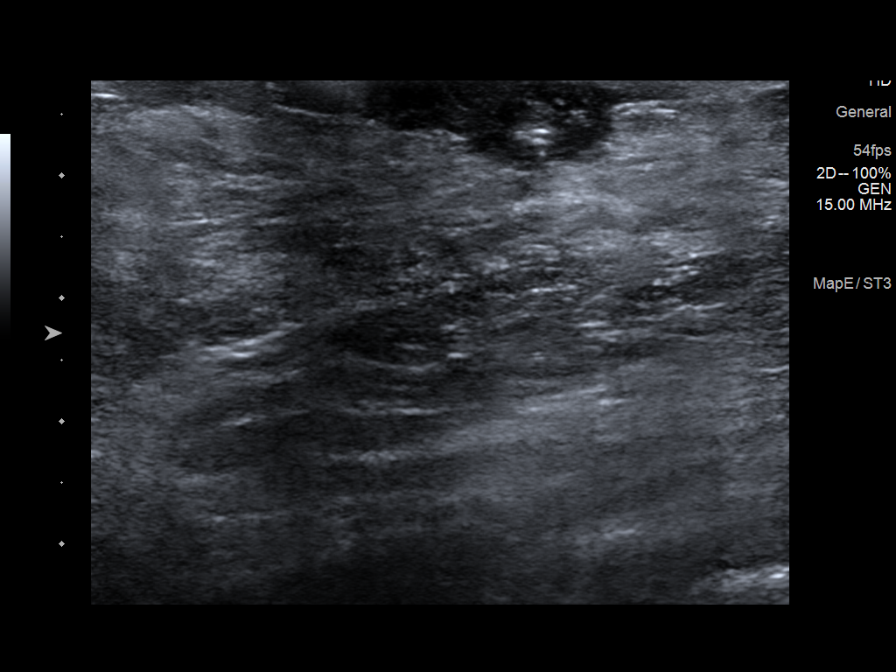
[im 6/7]
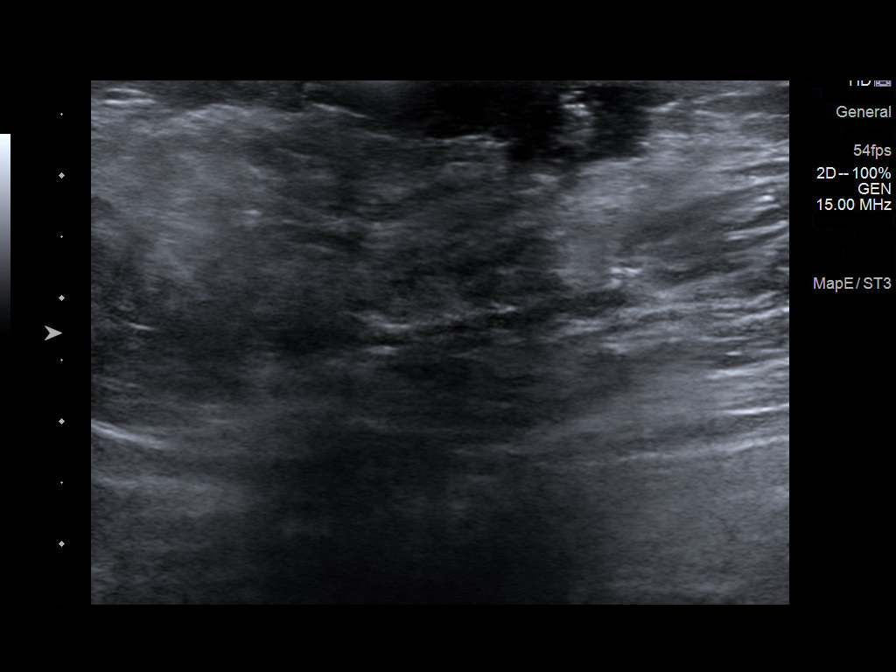
[im 7/7]
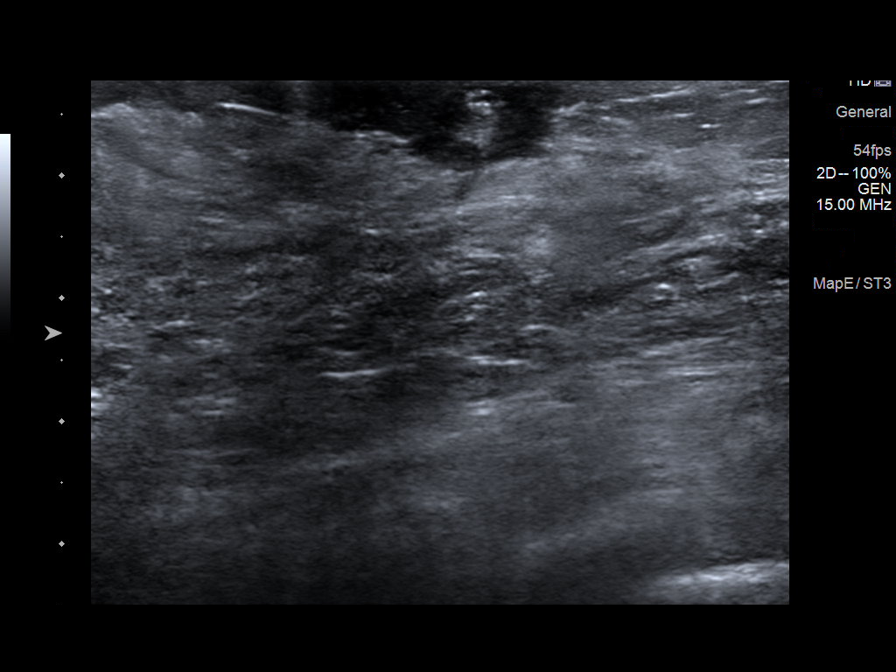

[7 of 7 positions shown; findings below may reference images not displayed]

Ultrasound-guided biopsy was performed on 08/17/2021 for this
retroareolar mass/collection revealing fibrocystic changes and
abscess formation. Additional aspiration sample was sent to the lab
with Gram stain showing few WBCs and culture showing rare Mobiluncus
species.

Patient returns today for a follow-up ultrasound to exclude
reaccumulation of the retroareolar fluid collection.

Patient is status post surgical excision on 01/25/2021 for a RIGHT
breast papilloma at the 9 o'clock axis.

EXAM:
ULTRASOUND OF THE RIGHT BREAST
FINDINGS: On physical exam, there is no skin redness or dimpling at the RIGHT
nipple or within the periareolar RIGHT breast.

Targeted ultrasound is performed, showing significant interval
improvement of the complex collection in the retroareolar RIGHT
breast, largest component now measuring only 1 x 0.7 cm (previously
2.5 x 1.5 cm), predominantly phlegmonous in appearance. No drainable
collection remains.
IMPRESSION: Significant interval improvement, near resolution, of the complex
fluid collection in the retroareolar RIGHT breast, now predominantly
phlegmonous in appearance, and now without any drainable fluid
collection. Ultrasound-guided biopsy was performed on 08/17/2021 for
this retroareolar mass/collection with pathology result of
fibrocystic change and abscess with no evidence of malignancy. No
additional follow-up diagnostic imaging is necessary for this benign
finding.

RECOMMENDATION:
1. Annual screening mammograms.
2. The patient was instructed to return sooner if there is recurrent
pain, lump and/or swelling within the RIGHT breast, if any skin
redness or dimpling developed, or if any drainage from the skin was
subsequently noted.

I have discussed the findings and recommendations with the patient.
If applicable, a reminder letter will be sent to the patient
regarding the next appointment.

BI-RADS CATEGORY  2: Benign.

ADDENDUM:
After review of patient's imaging history, the last LEFT breast
mammogram was obtained on 08/07/2020.

As such, RECOMMENDATION section should read as follows:

1. Screening mammogram for the LEFT breast now.
2. Resume annual bilateral screening mammogram schedule in Friday July, 2022.

*** End of Addendum ***
Ultrasound-guided biopsy was performed on 08/17/2021 for this
retroareolar mass/collection revealing fibrocystic changes and
abscess formation. Additional aspiration sample was sent to the lab
with Gram stain showing few WBCs and culture showing rare Mobiluncus
species.

Patient returns today for a follow-up ultrasound to exclude
reaccumulation of the retroareolar fluid collection.

Patient is status post surgical excision on 01/25/2021 for a RIGHT
breast papilloma at the 9 o'clock axis.

EXAM:
ULTRASOUND OF THE RIGHT BREAST
FINDINGS: On physical exam, there is no skin redness or dimpling at the RIGHT
nipple or within the periareolar RIGHT breast.

Targeted ultrasound is performed, showing significant interval
improvement of the complex collection in the retroareolar RIGHT
breast, largest component now measuring only 1 x 0.7 cm (previously
2.5 x 1.5 cm), predominantly phlegmonous in appearance. No drainable
collection remains.
IMPRESSION: Significant interval improvement, near resolution, of the complex
fluid collection in the retroareolar RIGHT breast, now predominantly
phlegmonous in appearance, and now without any drainable fluid
collection. Ultrasound-guided biopsy was performed on 08/17/2021 for
this retroareolar mass/collection with pathology result of
fibrocystic change and abscess with no evidence of malignancy. No
additional follow-up diagnostic imaging is necessary for this benign
finding.

RECOMMENDATION:
1. Annual screening mammograms.
2. The patient was instructed to return sooner if there is recurrent
pain, lump and/or swelling within the RIGHT breast, if any skin
redness or dimpling developed, or if any drainage from the skin was
subsequently noted.

I have discussed the findings and recommendations with the patient.
If applicable, a reminder letter will be sent to the patient
regarding the next appointment.

BI-RADS CATEGORY  2: Benign.

## 2022-06-27 ENCOUNTER — Telehealth: Payer: Self-pay

## 2022-06-27 NOTE — Telephone Encounter (Addendum)
Called patient and made her aware of results and will send a message to scheduling to make sure her appointments are set.     ----- Message from Truitt Merle, MD sent at 06/27/2022  7:58 AM EST ----- Please let pt know her iron level is low and anemia is worse, please schedule ferric gluconase [id number], thanks   YTruitt Merle

## 2022-07-05 ENCOUNTER — Inpatient Hospital Stay: Payer: 59

## 2022-07-05 ENCOUNTER — Other Ambulatory Visit: Payer: Self-pay

## 2022-07-05 VITALS — BP 149/61 | HR 60 | Temp 98.1°F | Resp 18

## 2022-07-05 DIAGNOSIS — D500 Iron deficiency anemia secondary to blood loss (chronic): Secondary | ICD-10-CM

## 2022-07-05 DIAGNOSIS — K5520 Angiodysplasia of colon without hemorrhage: Secondary | ICD-10-CM

## 2022-07-05 DIAGNOSIS — Q2733 Arteriovenous malformation of digestive system vessel: Secondary | ICD-10-CM | POA: Diagnosis not present

## 2022-07-05 MED ORDER — LORATADINE 10 MG PO TABS
10.0000 mg | ORAL_TABLET | Freq: Once | ORAL | Status: AC
  Administered 2022-07-05: 10 mg via ORAL
  Filled 2022-07-05: qty 1

## 2022-07-05 MED ORDER — ACETAMINOPHEN 325 MG PO TABS
650.0000 mg | ORAL_TABLET | Freq: Once | ORAL | Status: AC
  Administered 2022-07-05: 650 mg via ORAL
  Filled 2022-07-05: qty 2

## 2022-07-05 MED ORDER — HEPARIN SOD (PORK) LOCK FLUSH 100 UNIT/ML IV SOLN
500.0000 [IU] | Freq: Once | INTRAVENOUS | Status: AC
  Administered 2022-07-05: 500 [IU]

## 2022-07-05 MED ORDER — SODIUM CHLORIDE 0.9% FLUSH
10.0000 mL | Freq: Once | INTRAVENOUS | Status: AC
  Administered 2022-07-05: 10 mL

## 2022-07-05 MED ORDER — SODIUM CHLORIDE 0.9 % IV SOLN: Freq: Once | INTRAVENOUS | Status: AC

## 2022-07-05 MED ORDER — SODIUM CHLORIDE 0.9 % IV SOLN
250.0000 mg | INTRAVENOUS | Status: DC
  Administered 2022-07-05: 250 mg via INTRAVENOUS
  Filled 2022-07-05: qty 20

## 2022-07-05 NOTE — Progress Notes (Signed)
Pt tolerated ferrlicit infusion well. Pt declined to stay 30 min observation, vital signs stable, ambulatory at discharge.

## 2022-07-05 NOTE — Patient Instructions (Signed)
Sodium Ferric Gluconate Complex Injection What is this medication? SODIUM FERRIC GLUCONATE COMPLEX (SOE dee um FER ik GLOO koe nate KOM pleks) treats low levels of iron (iron deficiency anemia) in people with kidney disease. Iron is a mineral that plays an important role in making red blood cells, which carry oxygen from your lungs to the rest of your body. This medicine may be used for other purposes; ask your health care provider or pharmacist if you have questions. COMMON BRAND NAME(S): Ferrlecit, Nulecit What should I tell my care team before I take this medication? They need to know if you have any of the following conditions: Anemia that is not from iron deficiency High levels of iron in the blood An unusual or allergic reaction to iron, other medications, foods, dyes, or preservatives Pregnant or are trying to become pregnant Breast-feeding How should I use this medication? This medication is injected into a vein. It is given by your care team in a hospital or clinic setting. Talk to your care team about the use of this medication in children. While it may be prescribed for children as young as 6 years for selected conditions, precautions do apply. Overdosage: If you think you have taken too much of this medicine contact a poison control center or emergency room at once. NOTE: This medicine is only for you. Do not share this medicine with others. What if I miss a dose? It is important not to miss your dose. Call your care team if you are unable to keep an appointment. What may interact with this medication? Do not take this medication with any of the following: Deferasirox Deferoxamine Dimercaprol This medication may also interact with the following: Other iron products This list may not describe all possible interactions. Give your health care provider a list of all the medicines, herbs, non-prescription drugs, or dietary supplements you use. Also tell them if you smoke, drink  alcohol, or use illegal drugs. Some items may interact with your medicine. What should I watch for while using this medication? Your condition will be monitored carefully while you are receiving this medication. Visit your care team for regular checks on your progress. You may need blood work while you are taking this medication. What side effects may I notice from receiving this medication? Side effects that you should report to your care team as soon as possible: Allergic reactions--skin rash, itching, hives, swelling of the face, lips, tongue, or throat Low blood pressure--dizziness, feeling faint or lightheaded, blurry vision Shortness of breath Side effects that usually do not require medical attention (report to your care team if they continue or are bothersome): Flushing Headache Joint pain Muscle pain Nausea Pain, redness, or irritation at injection site This list may not describe all possible side effects. Call your doctor for medical advice about side effects. You may report side effects to FDA at 1-800-FDA-1088. Where should I keep my medication? This medication is given in a hospital or clinic and will not be stored at home. NOTE: This sheet is a summary. It may not cover all possible information. If you have questions about this medicine, talk to your doctor, pharmacist, or health care provider.  2023 Elsevier/Gold Standard (2007-06-23 00:00:00) 

## 2022-07-12 ENCOUNTER — Other Ambulatory Visit: Payer: Self-pay | Admitting: Hematology and Oncology

## 2022-07-12 ENCOUNTER — Other Ambulatory Visit: Payer: Self-pay

## 2022-07-12 ENCOUNTER — Inpatient Hospital Stay: Payer: 59

## 2022-07-12 ENCOUNTER — Encounter: Payer: Self-pay | Admitting: Hematology

## 2022-07-12 VITALS — BP 126/49 | HR 63 | Temp 98.4°F | Resp 18

## 2022-07-12 DIAGNOSIS — K5520 Angiodysplasia of colon without hemorrhage: Secondary | ICD-10-CM

## 2022-07-12 DIAGNOSIS — Q2733 Arteriovenous malformation of digestive system vessel: Secondary | ICD-10-CM | POA: Diagnosis not present

## 2022-07-12 DIAGNOSIS — D500 Iron deficiency anemia secondary to blood loss (chronic): Secondary | ICD-10-CM

## 2022-07-12 MED ORDER — SODIUM CHLORIDE 0.9% FLUSH
10.0000 mL | Freq: Once | INTRAVENOUS | Status: AC
  Administered 2022-07-12: 10 mL

## 2022-07-12 MED ORDER — SODIUM CHLORIDE 0.9 % IV SOLN: Freq: Once | INTRAVENOUS | Status: AC

## 2022-07-12 MED ORDER — SODIUM CHLORIDE 0.9 % IV SOLN
250.0000 mg | INTRAVENOUS | Status: DC
  Administered 2022-07-12: 250 mg via INTRAVENOUS
  Filled 2022-07-12: qty 20

## 2022-07-12 MED ORDER — HEPARIN SOD (PORK) LOCK FLUSH 100 UNIT/ML IV SOLN
500.0000 [IU] | Freq: Once | INTRAVENOUS | Status: AC
  Administered 2022-07-12: 500 [IU]

## 2022-07-12 NOTE — Patient Instructions (Signed)
Sodium Ferric Gluconate Complex Injection What is this medication? SODIUM FERRIC GLUCONATE COMPLEX (SOE dee um FER ik GLOO koe nate KOM pleks) treats low levels of iron (iron deficiency anemia) in people with kidney disease. Iron is a mineral that plays an important role in making red blood cells, which carry oxygen from your lungs to the rest of your body. This medicine may be used for other purposes; ask your health care provider or pharmacist if you have questions. COMMON BRAND NAME(S): Ferrlecit, Nulecit What should I tell my care team before I take this medication? They need to know if you have any of the following conditions: Anemia that is not from iron deficiency High levels of iron in the blood An unusual or allergic reaction to iron, other medications, foods, dyes, or preservatives Pregnant or are trying to become pregnant Breast-feeding How should I use this medication? This medication is injected into a vein. It is given by your care team in a hospital or clinic setting. Talk to your care team about the use of this medication in children. While it may be prescribed for children as young as 6 years for selected conditions, precautions do apply. Overdosage: If you think you have taken too much of this medicine contact a poison control center or emergency room at once. NOTE: This medicine is only for you. Do not share this medicine with others. What if I miss a dose? It is important not to miss your dose. Call your care team if you are unable to keep an appointment. What may interact with this medication? Do not take this medication with any of the following: Deferasirox Deferoxamine Dimercaprol This medication may also interact with the following: Other iron products This list may not describe all possible interactions. Give your health care provider a list of all the medicines, herbs, non-prescription drugs, or dietary supplements you use. Also tell them if you smoke, drink  alcohol, or use illegal drugs. Some items may interact with your medicine. What should I watch for while using this medication? Your condition will be monitored carefully while you are receiving this medication. Visit your care team for regular checks on your progress. You may need blood work while you are taking this medication. What side effects may I notice from receiving this medication? Side effects that you should report to your care team as soon as possible: Allergic reactions--skin rash, itching, hives, swelling of the face, lips, tongue, or throat Low blood pressure--dizziness, feeling faint or lightheaded, blurry vision Shortness of breath Side effects that usually do not require medical attention (report to your care team if they continue or are bothersome): Flushing Headache Joint pain Muscle pain Nausea Pain, redness, or irritation at injection site This list may not describe all possible side effects. Call your doctor for medical advice about side effects. You may report side effects to FDA at 1-800-FDA-1088. Where should I keep my medication? This medication is given in a hospital or clinic and will not be stored at home. NOTE: This sheet is a summary. It may not cover all possible information. If you have questions about this medicine, talk to your doctor, pharmacist, or health care provider.  2023 Elsevier/Gold Standard (2020-09-25 00:00:00)  

## 2022-07-19 ENCOUNTER — Inpatient Hospital Stay: Payer: 59 | Attending: Hematology

## 2022-07-19 ENCOUNTER — Other Ambulatory Visit: Payer: Self-pay

## 2022-07-19 VITALS — BP 137/48 | HR 66 | Temp 98.3°F | Resp 18

## 2022-07-19 DIAGNOSIS — D500 Iron deficiency anemia secondary to blood loss (chronic): Secondary | ICD-10-CM | POA: Diagnosis present

## 2022-07-19 DIAGNOSIS — Q2733 Arteriovenous malformation of digestive system vessel: Secondary | ICD-10-CM | POA: Insufficient documentation

## 2022-07-19 DIAGNOSIS — K5520 Angiodysplasia of colon without hemorrhage: Secondary | ICD-10-CM

## 2022-07-19 MED ORDER — SODIUM CHLORIDE 0.9% FLUSH
10.0000 mL | Freq: Once | INTRAVENOUS | Status: AC
  Administered 2022-07-19: 10 mL

## 2022-07-19 MED ORDER — SODIUM CHLORIDE 0.9 % IV SOLN: Freq: Once | INTRAVENOUS | Status: AC

## 2022-07-19 MED ORDER — HEPARIN SOD (PORK) LOCK FLUSH 100 UNIT/ML IV SOLN
500.0000 [IU] | Freq: Once | INTRAVENOUS | Status: AC
  Administered 2022-07-19: 500 [IU]

## 2022-07-19 MED ORDER — SODIUM CHLORIDE 0.9 % IV SOLN
250.0000 mg | INTRAVENOUS | Status: DC
  Administered 2022-07-19: 250 mg via INTRAVENOUS
  Filled 2022-07-19: qty 20

## 2022-07-19 NOTE — Patient Instructions (Signed)
Sodium Ferric Gluconate Complex Injection What is this medication? SODIUM FERRIC GLUCONATE COMPLEX (SOE dee um FER ik GLOO koe nate KOM pleks) treats low levels of iron (iron deficiency anemia) in people with kidney disease. Iron is a mineral that plays an important role in making red blood cells, which carry oxygen from your lungs to the rest of your body. This medicine may be used for other purposes; ask your health care provider or pharmacist if you have questions. COMMON BRAND NAME(S): Ferrlecit, Nulecit What should I tell my care team before I take this medication? They need to know if you have any of the following conditions: Anemia that is not from iron deficiency High levels of iron in the blood An unusual or allergic reaction to iron, other medications, foods, dyes, or preservatives Pregnant or are trying to become pregnant Breast-feeding How should I use this medication? This medication is injected into a vein. It is given by your care team in a hospital or clinic setting. Talk to your care team about the use of this medication in children. While it may be prescribed for children as young as 6 years for selected conditions, precautions do apply. Overdosage: If you think you have taken too much of this medicine contact a poison control center or emergency room at once. NOTE: This medicine is only for you. Do not share this medicine with others. What if I miss a dose? It is important not to miss your dose. Call your care team if you are unable to keep an appointment. What may interact with this medication? Do not take this medication with any of the following: Deferasirox Deferoxamine Dimercaprol This medication may also interact with the following: Other iron products This list may not describe all possible interactions. Give your health care provider a list of all the medicines, herbs, non-prescription drugs, or dietary supplements you use. Also tell them if you smoke, drink  alcohol, or use illegal drugs. Some items may interact with your medicine. What should I watch for while using this medication? Your condition will be monitored carefully while you are receiving this medication. Visit your care team for regular checks on your progress. You may need blood work while you are taking this medication. What side effects may I notice from receiving this medication? Side effects that you should report to your care team as soon as possible: Allergic reactions--skin rash, itching, hives, swelling of the face, lips, tongue, or throat Low blood pressure--dizziness, feeling faint or lightheaded, blurry vision Shortness of breath Side effects that usually do not require medical attention (report to your care team if they continue or are bothersome): Flushing Headache Joint pain Muscle pain Nausea Pain, redness, or irritation at injection site This list may not describe all possible side effects. Call your doctor for medical advice about side effects. You may report side effects to FDA at 1-800-FDA-1088. Where should I keep my medication? This medication is given in a hospital or clinic and will not be stored at home. NOTE: This sheet is a summary. It may not cover all possible information. If you have questions about this medicine, talk to your doctor, pharmacist, or health care provider.  2023 Elsevier/Gold Standard (2020-09-25 00:00:00)  

## 2022-07-19 NOTE — Progress Notes (Signed)
Pt declined 77mn observation period post ferrlecit infusion. VSS stable at d/c. Pt ambulated to lobby.

## 2022-07-27 ENCOUNTER — Other Ambulatory Visit: Payer: Self-pay

## 2022-07-27 ENCOUNTER — Inpatient Hospital Stay: Payer: 59

## 2022-07-27 VITALS — BP 144/54 | HR 60 | Resp 14

## 2022-07-27 DIAGNOSIS — D500 Iron deficiency anemia secondary to blood loss (chronic): Secondary | ICD-10-CM

## 2022-07-27 DIAGNOSIS — K5520 Angiodysplasia of colon without hemorrhage: Secondary | ICD-10-CM

## 2022-07-27 DIAGNOSIS — Q2733 Arteriovenous malformation of digestive system vessel: Secondary | ICD-10-CM | POA: Diagnosis not present

## 2022-07-27 LAB — CBC WITH DIFFERENTIAL (CANCER CENTER ONLY)
Abs Immature Granulocytes: 0.03 10*3/uL (ref 0.00–0.07)
Basophils Absolute: 0 10*3/uL (ref 0.0–0.1)
Basophils Relative: 0 %
Eosinophils Absolute: 0.1 10*3/uL (ref 0.0–0.5)
Eosinophils Relative: 2 %
HCT: 32 % — ABNORMAL LOW (ref 36.0–46.0)
Hemoglobin: 9.8 g/dL — ABNORMAL LOW (ref 12.0–15.0)
Immature Granulocytes: 0 %
Lymphocytes Relative: 15 %
Lymphs Abs: 1.1 10*3/uL (ref 0.7–4.0)
MCH: 24.2 pg — ABNORMAL LOW (ref 26.0–34.0)
MCHC: 30.6 g/dL (ref 30.0–36.0)
MCV: 79 fL — ABNORMAL LOW (ref 80.0–100.0)
Monocytes Absolute: 0.5 10*3/uL (ref 0.1–1.0)
Monocytes Relative: 7 %
Neutro Abs: 5.7 10*3/uL (ref 1.7–7.7)
Neutrophils Relative %: 76 %
Platelet Count: 279 10*3/uL (ref 150–400)
RBC: 4.05 MIL/uL (ref 3.87–5.11)
RDW: 19.1 % — ABNORMAL HIGH (ref 11.5–15.5)
WBC Count: 7.5 10*3/uL (ref 4.0–10.5)
nRBC: 0 % (ref 0.0–0.2)

## 2022-07-27 LAB — CMP (CANCER CENTER ONLY)
ALT: 21 U/L (ref 0–44)
AST: 17 U/L (ref 15–41)
Albumin: 4.2 g/dL (ref 3.5–5.0)
Alkaline Phosphatase: 131 U/L — ABNORMAL HIGH (ref 38–126)
Anion gap: 7 (ref 5–15)
BUN: 35 mg/dL — ABNORMAL HIGH (ref 8–23)
CO2: 25 mmol/L (ref 22–32)
Calcium: 10.3 mg/dL (ref 8.9–10.3)
Chloride: 104 mmol/L (ref 98–111)
Creatinine: 1.62 mg/dL — ABNORMAL HIGH (ref 0.44–1.00)
GFR, Estimated: 34 mL/min — ABNORMAL LOW (ref >60)
Glucose, Bld: 279 mg/dL — ABNORMAL HIGH (ref 70–99)
Potassium: 4.5 mmol/L (ref 3.5–5.1)
Sodium: 136 mmol/L (ref 135–145)
Total Bilirubin: 0.3 mg/dL (ref 0.3–1.2)
Total Protein: 7 g/dL (ref 6.5–8.1)

## 2022-07-27 LAB — FERRITIN: Ferritin: 135 ng/mL (ref 11–307)

## 2022-07-27 MED ORDER — HEPARIN SOD (PORK) LOCK FLUSH 100 UNIT/ML IV SOLN
500.0000 [IU] | Freq: Once | INTRAVENOUS | Status: AC
  Administered 2022-07-27: 500 [IU] via INTRAVENOUS

## 2022-07-27 MED ORDER — SODIUM CHLORIDE 0.9% FLUSH
10.0000 mL | Freq: Once | INTRAVENOUS | Status: AC
  Administered 2022-07-27: 10 mL via INTRAVENOUS

## 2022-07-27 MED ORDER — SODIUM CHLORIDE 0.9 % IV SOLN
250.0000 mg | INTRAVENOUS | Status: DC
  Administered 2022-07-27: 250 mg via INTRAVENOUS
  Filled 2022-07-27: qty 20

## 2022-07-27 MED ORDER — SODIUM CHLORIDE 0.9% FLUSH
10.0000 mL | Freq: Once | INTRAVENOUS | Status: AC
  Administered 2022-07-27: 10 mL

## 2022-07-27 NOTE — Progress Notes (Signed)
Pt declined to be observed for 30 minutes post Ferrlecit infusion. Pt tolerated trtmt well w/out incident. VSS at discharge.  Ambulatory to lobby.

## 2022-07-27 NOTE — Patient Instructions (Signed)
Sodium Ferric Gluconate Complex Injection What is this medication? SODIUM FERRIC GLUCONATE COMPLEX (SOE dee um FER ik GLOO koe nate KOM pleks) treats low levels of iron (iron deficiency anemia) in people with kidney disease. Iron is a mineral that plays an important role in making red blood cells, which carry oxygen from your lungs to the rest of your body. This medicine may be used for other purposes; ask your health care provider or pharmacist if you have questions. COMMON BRAND NAME(S): Ferrlecit, Nulecit What should I tell my care team before I take this medication? They need to know if you have any of the following conditions: Anemia that is not from iron deficiency High levels of iron in the blood An unusual or allergic reaction to iron, other medications, foods, dyes, or preservatives Pregnant or are trying to become pregnant Breast-feeding How should I use this medication? This medication is injected into a vein. It is given by your care team in a hospital or clinic setting. Talk to your care team about the use of this medication in children. While it may be prescribed for children as young as 6 years for selected conditions, precautions do apply. Overdosage: If you think you have taken too much of this medicine contact a poison control center or emergency room at once. NOTE: This medicine is only for you. Do not share this medicine with others. What if I miss a dose? It is important not to miss your dose. Call your care team if you are unable to keep an appointment. What may interact with this medication? Do not take this medication with any of the following: Deferasirox Deferoxamine Dimercaprol This medication may also interact with the following: Other iron products This list may not describe all possible interactions. Give your health care provider a list of all the medicines, herbs, non-prescription drugs, or dietary supplements you use. Also tell them if you smoke, drink  alcohol, or use illegal drugs. Some items may interact with your medicine. What should I watch for while using this medication? Your condition will be monitored carefully while you are receiving this medication. Visit your care team for regular checks on your progress. You may need blood work while you are taking this medication. What side effects may I notice from receiving this medication? Side effects that you should report to your care team as soon as possible: Allergic reactions--skin rash, itching, hives, swelling of the face, lips, tongue, or throat Low blood pressure--dizziness, feeling faint or lightheaded, blurry vision Shortness of breath Side effects that usually do not require medical attention (report to your care team if they continue or are bothersome): Flushing Headache Joint pain Muscle pain Nausea Pain, redness, or irritation at injection site This list may not describe all possible side effects. Call your doctor for medical advice about side effects. You may report side effects to FDA at 1-800-FDA-1088. Where should I keep my medication? This medication is given in a hospital or clinic and will not be stored at home. NOTE: This sheet is a summary. It may not cover all possible information. If you have questions about this medicine, talk to your doctor, pharmacist, or health care provider.  2023 Elsevier/Gold Standard (2020-09-25 00:00:00)  

## 2022-08-03 ENCOUNTER — Ambulatory Visit
Admission: RE | Admit: 2022-08-03 | Discharge: 2022-08-03 | Disposition: A | Discharge status: Home / Self Care | Payer: 59 | Source: Ambulatory Visit | Attending: Hematology | Admitting: Hematology

## 2022-08-03 DIAGNOSIS — Z1231 Encounter for screening mammogram for malignant neoplasm of breast: Secondary | ICD-10-CM

## 2022-08-30 NOTE — Progress Notes (Signed)
[facility]   [phone] [phone]   Clinic Follow up Note   Patient Care Team: [name], NP as PCP - General (Nurse Practitioner) Flora Lipps, Ronnald Ramp, MD as PCP - Cardiology (Cardiology) Malachy Mood, MD as Consulting Physician (Hematology) Chuck Hint, MD as Consulting Physician (Vascular Surgery) Eleanora Neighbor, MD as Referring Physician (Gastroenterology)  Date of Service:  08/31/2022  CHIEF COMPLAINT: f/u of  anemia     CURRENT THERAPY:  IV  iron as needed (if ferritin <100)      ASSESSMENT:  Bethany [name] is a 72 y.o. female with   Iron deficiency anemia secondary to blood loss (chronic) -Pt has intestinal AVM history with associated chronic GI blood loss resulting in iron deficiency and blood loss anemia. She has required blood transfusions in the past in 2013 and in 2018 and frequent hospital admission for anemia. She does not meet the diagnosis criteria for hereditary hemorrhagic telangiectasia. -Her last colonoscopy/endoscopy was 02/2019, recall 2025.  -She is not on oral iron, due to the lack of benefit.  -Currently being treated with IV Iron to prevent significant anemia with goal of Ferritin 100-200 range    Multiple lung nodules h/o smoking intermittently for 40 years, then increased to daily after her mother passed in 2021.  -nodules initially seen on screening chest CT 07/12/19.  -Repeat 07/13/20 showed new nodules. PET scan on 07/28/20 showed mild hypermetabolism to dominant LUL nodule. Biopsy of left lung was attempted but held given difficult location. -she is now followed by Dr. Delton Coombes. Most recent CT 05/2022 showed stable lung nodules  -She continues to smoke. I again encouraged her to quit smoking.    Transaminitis -her CMP showed elevated ALT and AST in 200-400 range, total bilirubin is normal, is asymptomatic. -Will repeat her CMP in a month.  She knows not to drink alcohol (she drinks socially) nor to use  Tylenol.  PLAN: -lab reviewed -hg 10.0 -Iron level -pending -lab/flush in 6 weeks -f/u in 3 months  SUMMARY OF ONCOLOGIC HISTORY: Oncology History  Iron deficiency anemia secondary to blood loss (chronic)     INTERVAL HISTORY:  Bethany [name] is here for a follow up of  anemia . She was last seen by me on 05/20/2022. She presents to the clinic alone. Pt reports she is doing well. Pt denied having any bleeding and black stool. Pt state that she felt good after having IV Iron. Pt state that her Kidney functioning level is getting better.   All other systems were reviewed with the patient and are negative.  MEDICAL HISTORY:  Past Medical History:  Diagnosis Date   Acute renal failure    Anemia    Arthritis    COPD (chronic obstructive pulmonary disease)    Diabetes mellitus without complication    type II    GERD (gastroesophageal reflux disease)    Hypercalcemia    Hyperlipidemia    Hypertension    Pneumonia    Renal disorder    Single kidney    Stroke    July 2021   Thrombosis    Tobacco abuse     SURGICAL HISTORY: Past Surgical History:  Procedure Laterality Date   ABDOMINAL HYSTERECTOMY     blood clots removed from descending aorta      BREAST BIOPSY Right 07/28/2021   BREAST BIOPSY Right 08/17/2021   BREAST LUMPECTOMY Right 01/25/2021   BREAST LUMPECTOMY WITH RADIOACTIVE SEED LOCALIZATION Right 01/25/2021   Procedure: RIGHT BREAST  LUMPECTOMY WITH RADIOACTIVE SEED LOCALIZATION;  Surgeon: Abigail Miyamoto, MD;  Location: [facility];  Service: General;  Laterality: Right;   CHOLECYSTECTOMY     COLONOSCOPY     COLONOSCOPY WITH PROPOFOL N/A 04/17/2017   Procedure: COLONOSCOPY WITH PROPOFOL;  Surgeon: Charlott Rakes, MD;  Location: [facility];  Service: Endoscopy;  Laterality: N/A;   ESOPHAGOGASTRODUODENOSCOPY (EGD) WITH PROPOFOL N/A 04/17/2017   Procedure: ESOPHAGOGASTRODUODENOSCOPY (EGD) WITH PROPOFOL;  Surgeon: Charlott Rakes, MD;  Location:  [facility];  Service: Endoscopy;  Laterality: N/A;   IR 3D INDEPENDENT WKST  05/04/2020   IR ANGIO INTRA EXTRACRAN SEL COM CAROTID INNOMINATE BILAT MOD SED  02/06/2020   IR ANGIO INTRA EXTRACRAN SEL INTERNAL CAROTID UNI L MOD SED  05/04/2020   IR ANGIO VERTEBRAL SEL VERTEBRAL BILAT MOD SED  02/06/2020   IR ANGIOGRAM FOLLOW UP STUDY  05/04/2020   IR IMAGING GUIDED PORT INSERTION  05/01/2019   IR NEURO EACH ADD'L AFTER BASIC UNI LEFT (MS)  05/04/2020   IR RADIOLOGIST EVAL & MGMT  05/28/2020   IR TRANSCATH/EMBOLIZ  05/04/2020   IR US GUIDE VASC ACCESS RIGHT  02/06/2020   IR US GUIDE VASC ACCESS RIGHT  05/04/2020   LESION REMOVAL Left 01/25/2021   Procedure: ABNORMAL SKIN LESION REMOVAL LEFT ABDOMINAL WALL;  Surgeon: Abigail Miyamoto, MD;  Location: [facility];  Service: General;  Laterality: Left;   NEPHRECTOMY RECIPIENT     ORIF ANKLE FRACTURE N/A 07/13/2021   Procedure: OPEN REDUCTION INTERNAL FIXATION (ORIF) ANKLE FRACTURE;  Surgeon: Ernestina Columbia, MD;  Location: [facility];  Service: Orthopedics;  Laterality: N/A;   RADIOLOGY WITH ANESTHESIA N/A 05/04/2020   Procedure: Vertis Kelch;  Surgeon: Julieanne Cotton, MD;  Location: [facility];  Service: Radiology;  Laterality: N/A;    I have reviewed the social history and family history with the patient and they are unchanged from previous note.  ALLERGIES:  is allergic to contrast media [iodinated contrast media] and other.  MEDICATIONS:  Current Outpatient Medications  Medication Sig Dispense Refill   allopurinol (ZYLOPRIM) 100 MG tablet Take 100 mg by mouth at bedtime.     amLODipine (NORVASC) 10 MG tablet Take 10 mg by mouth daily.     aspirin EC 81 MG EC tablet Take 1 tablet (81 mg total) by mouth daily. Swallow whole. 30 tablet 11   atorvastatin (LIPITOR) 40 MG tablet Take 40 mg by mouth daily.     baclofen (LIORESAL) 10 MG tablet Take 20 mg by mouth 3 (three) times daily as needed for muscle spasms.     Dulaglutide  (TRULICITY) 1.5 MG/0.5ML SOPN Inject 1.5 mg into the skin every Monday.      ezetimibe (ZETIA) 10 MG tablet Take 10 mg by mouth daily.     famotidine (PEPCID) 20 MG tablet Take 20 mg by mouth 2 (two) times daily.     feeding supplement, ENSURE ENLIVE, (ENSURE ENLIVE) LIQD Take 237 mLs by mouth 2 (two) times daily between meals. 237 mL 12   furosemide (LASIX) 40 MG tablet Take 40 mg by mouth daily as needed for fluid or edema.     gabapentin (NEURONTIN) 100 MG capsule Take 2 capsules (200 mg total) by mouth 4 (four) times daily. 240 capsule 3   insulin glargine, 1 Unit Dial, (TOUJEO SOLOSTAR) 300 UNIT/ML Solostar Pen Inject 20 Units into the skin at bedtime.      insulin lispro (HUMALOG) 100 UNIT/ML KwikPen Inject 5 Units into the skin 3 (three) times  daily as needed (blood sugar >200). (Patient not taking: Reported on 03/09/2022)     Ipratropium-Albuterol (COMBIVENT) 20-100 MCG/ACT AERS respimat Inhale 1 puff into the lungs every 6 (six) hours as needed for wheezing or shortness of breath. 1 Inhaler 0   levothyroxine (SYNTHROID) 75 MCG tablet Take 75 mcg by mouth daily.     lidocaine-prilocaine (EMLA) cream APPLY 2-5 cms of cream TO port site 45 MINUTES - ONE HOUR prior TO access 30 g 1   losartan (COZAAR) 25 MG tablet Take 1 tablet (25 mg total) by mouth daily. 30 tablet 11   metoprolol (LOPRESSOR) 50 MG tablet Take 50 mg by mouth 2 (two) times daily.      NARCAN 4 MG/0.1ML LIQD nasal spray kit Place 1 spray into the nose as needed for opioid reversal.     Omega-3 Fatty Acids (FISH OIL PO) Take 1 capsule by mouth 2 (two) times daily.     oxyCODONE ER (XTAMPZA ER) 27 MG C12A      oxyCODONE-acetaminophen (PERCOCET) 10-325 MG tablet Take 1 tablet by mouth every 6 (six) hours as needed.     pantoprazole (PROTONIX) 40 MG tablet Take 40 mg by mouth every morning.     polyethylene glycol powder (MIRALAX) 17 GM/SCOOP powder Take 17 g by mouth 2 (two) times daily as needed for moderate constipation. 255 g  0   senna-docusate (SENOKOT-S) 8.6-50 MG tablet Take 1 tablet by mouth 2 (two) times daily between meals as needed for mild constipation. 60 tablet 0   Tetrahydrozoline HCl (VISINE OP) Place 1 drop into both eyes 3 (three) times daily as needed (dry eyes).     No current facility-administered medications for this visit.    PHYSICAL EXAMINATION: ECOG PERFORMANCE STATUS: 1 - Symptomatic but completely ambulatory  Vitals:   08/31/22 1314  BP: (!) 165/64  Pulse: 75  Resp: 18  Temp: 98.6 F (37 C)  SpO2: 98%   Wt Readings from Last 3 Encounters:  08/31/22 169 lb 3.2 oz (76.7 kg)  05/20/22 174 lb (78.9 kg)  04/29/22 176 lb 9.6 oz (80.1 kg)     GENERAL:alert, no distress and comfortable SKIN: skin color normal, no rashes or significant lesions EYES: normal, Conjunctiva are pink and non-injected, sclera clear  NEURO: alert & oriented x 3 with fluent speech LABORATORY DATA:  I have reviewed the data as listed    Latest Ref Rng & Units 08/31/2022   12:53 PM 07/27/2022   12:53 PM 06/22/2022   12:22 PM  CBC  WBC 4.0 - 10.5 K/uL 6.9  7.5  6.9   Hemoglobin 12.0 - 15.0 g/dL 82.9  9.8  8.8   Hematocrit 36.0 - 46.0 % 32.7  32.0  29.8   Platelets 150 - 400 K/uL 226  279  233         Latest Ref Rng & Units 08/31/2022   12:53 PM 07/27/2022   12:53 PM 06/22/2022   12:22 PM  CMP  Glucose 70 - 99 mg/dL 562  130  865   BUN 8 - 23 mg/dL 28  35  14   Creatinine 0.44 - 1.00 mg/dL 7.84  6.96  2.95   Sodium 135 - 145 mmol/L 137  136  134   Potassium 3.5 - 5.1 mmol/L 4.4  4.5  4.9   Chloride 98 - 111 mmol/L 103  104  102   CO2 22 - 32 mmol/L 30  25  27    Calcium 8.9 - 10.3  mg/dL 16.1  09.6  04.5   Total Protein 6.5 - 8.1 g/dL 6.9  7.0  6.1   Total Bilirubin 0.3 - 1.2 mg/dL 0.5  0.3  0.3   Alkaline Phos 38 - 126 U/L 470  131  193   AST 15 - 41 U/L 263  17  29   ALT 0 - 44 U/L 406  21  38       RADIOGRAPHIC STUDIES: I have personally reviewed the radiological images as listed and agreed  with the findings in the report. No results found.    No orders of the defined types were placed in this encounter.  All questions were answered. The patient knows to call the clinic with any problems, questions or concerns. No barriers to learning was detected. The total time spent in the appointment was 20 minutes.     Malachy Mood, MD 08/31/2022   Carolin Coy, CMA, am acting as scribe for Malachy Mood, MD.   I have reviewed the above documentation for accuracy and completeness, and I agree with the above.

## 2022-08-31 ENCOUNTER — Encounter: Payer: Self-pay | Admitting: Hematology

## 2022-08-31 ENCOUNTER — Telehealth: Payer: Self-pay

## 2022-08-31 ENCOUNTER — Inpatient Hospital Stay: Payer: 59 | Attending: Hematology | Admitting: Hematology

## 2022-08-31 ENCOUNTER — Inpatient Hospital Stay: Payer: 59

## 2022-08-31 ENCOUNTER — Other Ambulatory Visit: Payer: Self-pay

## 2022-08-31 VITALS — BP 165/64 | HR 75 | Temp 98.6°F | Resp 18 | Ht 65.0 in | Wt 169.2 lb

## 2022-08-31 DIAGNOSIS — Q2733 Arteriovenous malformation of digestive system vessel: Secondary | ICD-10-CM | POA: Diagnosis present

## 2022-08-31 DIAGNOSIS — Z452 Encounter for adjustment and management of vascular access device: Secondary | ICD-10-CM | POA: Diagnosis not present

## 2022-08-31 DIAGNOSIS — D500 Iron deficiency anemia secondary to blood loss (chronic): Secondary | ICD-10-CM

## 2022-08-31 DIAGNOSIS — K5520 Angiodysplasia of colon without hemorrhage: Secondary | ICD-10-CM

## 2022-08-31 DIAGNOSIS — R918 Other nonspecific abnormal finding of lung field: Secondary | ICD-10-CM

## 2022-08-31 DIAGNOSIS — R7401 Elevation of levels of liver transaminase levels: Secondary | ICD-10-CM | POA: Diagnosis not present

## 2022-08-31 LAB — CMP (CANCER CENTER ONLY)
ALT: 406 U/L (ref 0–44)
AST: 263 U/L (ref 15–41)
Albumin: 4.1 g/dL (ref 3.5–5.0)
Alkaline Phosphatase: 470 U/L — ABNORMAL HIGH (ref 38–126)
Anion gap: 4 — ABNORMAL LOW (ref 5–15)
BUN: 28 mg/dL — ABNORMAL HIGH (ref 8–23)
CO2: 30 mmol/L (ref 22–32)
Calcium: 10.9 mg/dL — ABNORMAL HIGH (ref 8.9–10.3)
Chloride: 103 mmol/L (ref 98–111)
Creatinine: 1.51 mg/dL — ABNORMAL HIGH (ref 0.44–1.00)
GFR, Estimated: 37 mL/min — ABNORMAL LOW (ref >60)
Glucose, Bld: 212 mg/dL — ABNORMAL HIGH (ref 70–99)
Potassium: 4.4 mmol/L (ref 3.5–5.1)
Sodium: 137 mmol/L (ref 135–145)
Total Bilirubin: 0.5 mg/dL (ref 0.3–1.2)
Total Protein: 6.9 g/dL (ref 6.5–8.1)

## 2022-08-31 LAB — CBC WITH DIFFERENTIAL (CANCER CENTER ONLY)
Abs Immature Granulocytes: 0.08 10*3/uL — ABNORMAL HIGH (ref 0.00–0.07)
Basophils Absolute: 0 10*3/uL (ref 0.0–0.1)
Basophils Relative: 0 %
Eosinophils Absolute: 0.1 10*3/uL (ref 0.0–0.5)
Eosinophils Relative: 1 %
HCT: 32.7 % — ABNORMAL LOW (ref 36.0–46.0)
Hemoglobin: 10 g/dL — ABNORMAL LOW (ref 12.0–15.0)
Immature Granulocytes: 1 %
Lymphocytes Relative: 15 %
Lymphs Abs: 1 10*3/uL (ref 0.7–4.0)
MCH: 24.3 pg — ABNORMAL LOW (ref 26.0–34.0)
MCHC: 30.6 g/dL (ref 30.0–36.0)
MCV: 79.6 fL — ABNORMAL LOW (ref 80.0–100.0)
Monocytes Absolute: 0.5 10*3/uL (ref 0.1–1.0)
Monocytes Relative: 7 %
Neutro Abs: 5.3 10*3/uL (ref 1.7–7.7)
Neutrophils Relative %: 76 %
Platelet Count: 226 10*3/uL (ref 150–400)
RBC: 4.11 MIL/uL (ref 3.87–5.11)
RDW: 17.6 % — ABNORMAL HIGH (ref 11.5–15.5)
WBC Count: 6.9 10*3/uL (ref 4.0–10.5)
nRBC: 1 % — ABNORMAL HIGH (ref 0.0–0.2)

## 2022-08-31 LAB — FERRITIN: Ferritin: 134 ng/mL (ref 11–307)

## 2022-08-31 MED ORDER — SODIUM CHLORIDE 0.9% FLUSH
10.0000 mL | Freq: Once | INTRAVENOUS | Status: AC
  Administered 2022-08-31: 10 mL

## 2022-08-31 MED ORDER — HEPARIN SOD (PORK) LOCK FLUSH 100 UNIT/ML IV SOLN
500.0000 [IU] | Freq: Once | INTRAVENOUS | Status: AC
  Administered 2022-08-31: 500 [IU]

## 2022-08-31 NOTE — Telephone Encounter (Signed)
Critical lab value reported from lab: AST 263 and ALT 406.  Notified Dr. Mosetta Putt of critical lab.

## 2022-08-31 NOTE — Assessment & Plan Note (Signed)
--  Pt has intestinal AVM history with associated chronic GI blood loss resulting in iron deficiency and blood loss anemia. She has required blood transfusions in the past in 2013 and in 2018 and frequent hospital admission for anemia. She does not meet the diagnosis criteria for hereditary hemorrhagic telangiectasia. -Her last colonoscopy/endoscopy was 02/2019, recall 2025.  -She is not on oral iron, due to the lack of benefit.  -Currently being treated with IV Iron to prevent significant anemia with goal of Ferritin 100-200 range 

## 2022-08-31 NOTE — Assessment & Plan Note (Signed)
h/o smoking intermittently for 40 years, then increased to daily after her mother passed in 2021.  -nodules initially seen on screening chest CT 07/12/19.  -Repeat 07/13/20 showed new nodules. PET scan on 07/28/20 showed mild hypermetabolism to dominant LUL nodule. Biopsy of left lung was attempted but held given difficult location. -she is now followed by Dr. Byrum. Most recent CT 11/26/21 shows waxing and waning of pulmonary nodules. Next scheduled 05/26/22. -She continues to smoke. I again encouraged her to quit smoking.   

## 2022-09-01 ENCOUNTER — Telehealth: Payer: Self-pay

## 2022-09-01 NOTE — Telephone Encounter (Signed)
Spoke with pt regarding her elevated AST & ALT.  Asked pt if she has been taking large amounts of Acetaminophen or consuming alcohol.  Pt stated she's not taking acetaminophen (Tylenol) but did drink alcohol during Easter and with her friends on 08/20/2022.  Pt stated she consumed a lot of alcohol during these times because she was celebrating one of her friend's birthday.  Informed pt that the large consumption of Alcohol is probably the cause of her AST & ALT being elevated.  Instructed pt to not consume any alcohol if possible until after her next lab draw which is on 10/12/2022.  Pt verbalized understanding and agreed to refrain from alcohol until after her lab appt scheduled on 10/12/2022.  Also, informed pt that her Ferritin is normal and she does not require IV Iron infusions.  Pt verbalized understanding and had no further questions or concerns at this time.

## 2022-10-08 ENCOUNTER — Inpatient Hospital Stay (HOSPITAL_COMMUNITY)
Admission: EM | Admit: 2022-10-08 | Discharge: 2022-10-12 | DRG: 917 | Disposition: A | Discharge status: Home / Self Care | Payer: 59 | Attending: Internal Medicine | Admitting: Internal Medicine

## 2022-10-08 ENCOUNTER — Emergency Department (HOSPITAL_COMMUNITY): Payer: 59

## 2022-10-08 DIAGNOSIS — T40605A Adverse effect of unspecified narcotics, initial encounter: Secondary | ICD-10-CM | POA: Diagnosis present

## 2022-10-08 DIAGNOSIS — T40691A Poisoning by other narcotics, accidental (unintentional), initial encounter: Secondary | ICD-10-CM | POA: Diagnosis not present

## 2022-10-08 DIAGNOSIS — J449 Chronic obstructive pulmonary disease, unspecified: Secondary | ICD-10-CM | POA: Diagnosis present

## 2022-10-08 DIAGNOSIS — E782 Mixed hyperlipidemia: Secondary | ICD-10-CM | POA: Diagnosis present

## 2022-10-08 DIAGNOSIS — F17210 Nicotine dependence, cigarettes, uncomplicated: Secondary | ICD-10-CM | POA: Diagnosis present

## 2022-10-08 DIAGNOSIS — K219 Gastro-esophageal reflux disease without esophagitis: Secondary | ICD-10-CM | POA: Diagnosis present

## 2022-10-08 DIAGNOSIS — Z886 Allergy status to analgesic agent status: Secondary | ICD-10-CM

## 2022-10-08 DIAGNOSIS — I130 Hypertensive heart and chronic kidney disease with heart failure and stage 1 through stage 4 chronic kidney disease, or unspecified chronic kidney disease: Secondary | ICD-10-CM | POA: Diagnosis present

## 2022-10-08 DIAGNOSIS — Z9181 History of falling: Secondary | ICD-10-CM

## 2022-10-08 DIAGNOSIS — E039 Hypothyroidism, unspecified: Secondary | ICD-10-CM | POA: Diagnosis present

## 2022-10-08 DIAGNOSIS — Z794 Long term (current) use of insulin: Secondary | ICD-10-CM | POA: Diagnosis not present

## 2022-10-08 DIAGNOSIS — Z91041 Radiographic dye allergy status: Secondary | ICD-10-CM

## 2022-10-08 DIAGNOSIS — F32A Depression, unspecified: Secondary | ICD-10-CM | POA: Diagnosis present

## 2022-10-08 DIAGNOSIS — E861 Hypovolemia: Secondary | ICD-10-CM | POA: Diagnosis present

## 2022-10-08 DIAGNOSIS — M549 Dorsalgia, unspecified: Secondary | ICD-10-CM | POA: Diagnosis present

## 2022-10-08 DIAGNOSIS — D509 Iron deficiency anemia, unspecified: Secondary | ICD-10-CM | POA: Diagnosis present

## 2022-10-08 DIAGNOSIS — Z833 Family history of diabetes mellitus: Secondary | ICD-10-CM

## 2022-10-08 DIAGNOSIS — F1120 Opioid dependence, uncomplicated: Secondary | ICD-10-CM | POA: Diagnosis present

## 2022-10-08 DIAGNOSIS — G929 Unspecified toxic encephalopathy: Secondary | ICD-10-CM | POA: Diagnosis not present

## 2022-10-08 DIAGNOSIS — E1143 Type 2 diabetes mellitus with diabetic autonomic (poly)neuropathy: Secondary | ICD-10-CM | POA: Diagnosis present

## 2022-10-08 DIAGNOSIS — Z8249 Family history of ischemic heart disease and other diseases of the circulatory system: Secondary | ICD-10-CM

## 2022-10-08 DIAGNOSIS — J690 Pneumonitis due to inhalation of food and vomit: Secondary | ICD-10-CM | POA: Diagnosis present

## 2022-10-08 DIAGNOSIS — E1140 Type 2 diabetes mellitus with diabetic neuropathy, unspecified: Secondary | ICD-10-CM | POA: Diagnosis present

## 2022-10-08 DIAGNOSIS — E1165 Type 2 diabetes mellitus with hyperglycemia: Secondary | ICD-10-CM | POA: Diagnosis present

## 2022-10-08 DIAGNOSIS — I5032 Chronic diastolic (congestive) heart failure: Secondary | ICD-10-CM | POA: Insufficient documentation

## 2022-10-08 DIAGNOSIS — G8929 Other chronic pain: Secondary | ICD-10-CM | POA: Diagnosis present

## 2022-10-08 DIAGNOSIS — Z8673 Personal history of transient ischemic attack (TIA), and cerebral infarction without residual deficits: Secondary | ICD-10-CM

## 2022-10-08 DIAGNOSIS — Z7982 Long term (current) use of aspirin: Secondary | ICD-10-CM

## 2022-10-08 DIAGNOSIS — E1149 Type 2 diabetes mellitus with other diabetic neurological complication: Secondary | ICD-10-CM | POA: Diagnosis present

## 2022-10-08 DIAGNOSIS — Z940 Kidney transplant status: Secondary | ICD-10-CM

## 2022-10-08 DIAGNOSIS — R55 Syncope and collapse: Secondary | ICD-10-CM | POA: Diagnosis present

## 2022-10-08 DIAGNOSIS — R4182 Altered mental status, unspecified: Principal | ICD-10-CM

## 2022-10-08 DIAGNOSIS — M1990 Unspecified osteoarthritis, unspecified site: Secondary | ICD-10-CM | POA: Diagnosis present

## 2022-10-08 DIAGNOSIS — N179 Acute kidney failure, unspecified: Secondary | ICD-10-CM | POA: Diagnosis present

## 2022-10-08 DIAGNOSIS — Z79899 Other long term (current) drug therapy: Secondary | ICD-10-CM

## 2022-10-08 DIAGNOSIS — Z8679 Personal history of other diseases of the circulatory system: Secondary | ICD-10-CM

## 2022-10-08 DIAGNOSIS — E1122 Type 2 diabetes mellitus with diabetic chronic kidney disease: Secondary | ICD-10-CM | POA: Diagnosis present

## 2022-10-08 DIAGNOSIS — Z9049 Acquired absence of other specified parts of digestive tract: Secondary | ICD-10-CM

## 2022-10-08 DIAGNOSIS — G9341 Metabolic encephalopathy: Secondary | ICD-10-CM | POA: Diagnosis present

## 2022-10-08 DIAGNOSIS — G928 Other toxic encephalopathy: Secondary | ICD-10-CM | POA: Diagnosis present

## 2022-10-08 DIAGNOSIS — Z79890 Hormone replacement therapy (postmenopausal): Secondary | ICD-10-CM

## 2022-10-08 DIAGNOSIS — N1832 Chronic kidney disease, stage 3b: Secondary | ICD-10-CM | POA: Diagnosis present

## 2022-10-08 DIAGNOSIS — Z8701 Personal history of pneumonia (recurrent): Secondary | ICD-10-CM

## 2022-10-08 DIAGNOSIS — Z7985 Long-term (current) use of injectable non-insulin antidiabetic drugs: Secondary | ICD-10-CM

## 2022-10-08 DIAGNOSIS — K3184 Gastroparesis: Secondary | ICD-10-CM | POA: Diagnosis present

## 2022-10-08 DIAGNOSIS — Z90710 Acquired absence of both cervix and uterus: Secondary | ICD-10-CM

## 2022-10-08 LAB — CBC WITH DIFFERENTIAL/PLATELET
Abs Immature Granulocytes: 0.05 10*3/uL (ref 0.00–0.07)
Basophils Absolute: 0 10*3/uL (ref 0.0–0.1)
Basophils Relative: 0 %
Eosinophils Absolute: 0.1 10*3/uL (ref 0.0–0.5)
Eosinophils Relative: 1 %
HCT: 31.3 % — ABNORMAL LOW (ref 36.0–46.0)
Hemoglobin: 9.1 g/dL — ABNORMAL LOW (ref 12.0–15.0)
Immature Granulocytes: 0 %
Lymphocytes Relative: 9 %
Lymphs Abs: 1 10*3/uL (ref 0.7–4.0)
MCH: 22.5 pg — ABNORMAL LOW (ref 26.0–34.0)
MCHC: 29.1 g/dL — ABNORMAL LOW (ref 30.0–36.0)
MCV: 77.3 fL — ABNORMAL LOW (ref 80.0–100.0)
Monocytes Absolute: 0.6 10*3/uL (ref 0.1–1.0)
Monocytes Relative: 5 %
Neutro Abs: 9.5 10*3/uL — ABNORMAL HIGH (ref 1.7–7.7)
Neutrophils Relative %: 85 %
Platelets: 297 10*3/uL (ref 150–400)
RBC: 4.05 MIL/uL (ref 3.87–5.11)
RDW: 17 % — ABNORMAL HIGH (ref 11.5–15.5)
WBC: 11.2 10*3/uL — ABNORMAL HIGH (ref 4.0–10.5)
nRBC: 0.2 % (ref 0.0–0.2)

## 2022-10-08 LAB — BLOOD GAS, ARTERIAL
Acid-Base Excess: 3.8 mmol/L — ABNORMAL HIGH (ref 0.0–2.0)
Acid-Base Excess: 1 mmol/L (ref 0.0–2.0)
Bicarbonate: 30.9 mmol/L — ABNORMAL HIGH (ref 20.0–28.0)
Bicarbonate: 27.1 mmol/L (ref 20.0–28.0)
Drawn by: 270211
O2 Content: 100 L/min
O2 Saturation: 44.1 %
O2 Saturation: 100 %
Patient temperature: 37
Patient temperature: 37
pCO2 arterial: 60 mmHg — ABNORMAL HIGH (ref 32–48)
pCO2 arterial: 48 mmHg (ref 32–48)
pH, Arterial: 7.32 — ABNORMAL LOW (ref 7.35–7.45)
pH, Arterial: 7.36 (ref 7.35–7.45)
pO2, Arterial: <31 mmHg — CL (ref 83–108)
pO2, Arterial: 204 mmHg — ABNORMAL HIGH (ref 83–108)

## 2022-10-08 LAB — TSH
TSH: 2.592 u[IU]/mL (ref 0.350–4.500)
TSH: 0.58 u[IU]/mL (ref 0.350–4.500)

## 2022-10-08 LAB — LACTIC ACID, PLASMA: Lactic Acid, Venous: 1.4 mmol/L (ref 0.5–1.9)

## 2022-10-08 LAB — RAPID URINE DRUG SCREEN, HOSP PERFORMED
Amphetamines: NOT DETECTED
Barbiturates: NOT DETECTED
Benzodiazepines: NOT DETECTED
Cocaine: NOT DETECTED
Opiates: POSITIVE — AB
Tetrahydrocannabinol: NOT DETECTED

## 2022-10-08 LAB — PROTIME-INR
INR: 0.9 (ref 0.8–1.2)
Prothrombin Time: 12.2 seconds (ref 11.4–15.2)

## 2022-10-08 LAB — COMPREHENSIVE METABOLIC PANEL
ALT: 25 U/L (ref 0–44)
AST: 25 U/L (ref 15–41)
Albumin: 4 g/dL (ref 3.5–5.0)
Alkaline Phosphatase: 166 U/L — ABNORMAL HIGH (ref 38–126)
Anion gap: 11 (ref 5–15)
BUN: 37 mg/dL — ABNORMAL HIGH (ref 8–23)
CO2: 24 mmol/L (ref 22–32)
Calcium: 10.1 mg/dL (ref 8.9–10.3)
Chloride: 99 mmol/L (ref 98–111)
Creatinine, Ser: 2.3 mg/dL — ABNORMAL HIGH (ref 0.44–1.00)
GFR, Estimated: 22 mL/min — ABNORMAL LOW (ref >60)
Glucose, Bld: 384 mg/dL — ABNORMAL HIGH (ref 70–99)
Potassium: 5 mmol/L (ref 3.5–5.1)
Sodium: 134 mmol/L — ABNORMAL LOW (ref 135–145)
Total Bilirubin: 0.5 mg/dL (ref 0.3–1.2)
Total Protein: 7.6 g/dL (ref 6.5–8.1)

## 2022-10-08 LAB — GLUCOSE, CAPILLARY
Glucose-Capillary: 236 mg/dL — ABNORMAL HIGH (ref 70–99)
Glucose-Capillary: 225 mg/dL — ABNORMAL HIGH (ref 70–99)
Glucose-Capillary: 237 mg/dL — ABNORMAL HIGH (ref 70–99)

## 2022-10-08 LAB — URINALYSIS, W/ REFLEX TO CULTURE (INFECTION SUSPECTED)
Bacteria, UA: NONE SEEN
Bilirubin Urine: NEGATIVE
Glucose, UA: 150 mg/dL — AB
Hgb urine dipstick: NEGATIVE
Ketones, ur: NEGATIVE mg/dL
Leukocytes,Ua: NEGATIVE
Nitrite: NEGATIVE
Protein, ur: 30 mg/dL — AB
Specific Gravity, Urine: 1.019 (ref 1.005–1.030)
pH: 5 (ref 5.0–8.0)

## 2022-10-08 LAB — ACETAMINOPHEN LEVEL: Acetaminophen (Tylenol), Serum: <10 ug/mL — ABNORMAL LOW (ref 10–30)

## 2022-10-08 LAB — I-STAT CHEM 8, ED
BUN: 34 mg/dL — ABNORMAL HIGH (ref 8–23)
Calcium, Ion: 1.41 mmol/L — ABNORMAL HIGH (ref 1.15–1.40)
Chloride: 101 mmol/L (ref 98–111)
Creatinine, Ser: 2.3 mg/dL — ABNORMAL HIGH (ref 0.44–1.00)
Glucose, Bld: 376 mg/dL — ABNORMAL HIGH (ref 70–99)
HCT: 29 % — ABNORMAL LOW (ref 36.0–46.0)
Hemoglobin: 9.9 g/dL — ABNORMAL LOW (ref 12.0–15.0)
Potassium: 5 mmol/L (ref 3.5–5.1)
Sodium: 133 mmol/L — ABNORMAL LOW (ref 135–145)
TCO2: 26 mmol/L (ref 22–32)

## 2022-10-08 LAB — CBG MONITORING, ED
Glucose-Capillary: 368 mg/dL — ABNORMAL HIGH (ref 70–99)
Glucose-Capillary: 280 mg/dL — ABNORMAL HIGH (ref 70–99)

## 2022-10-08 LAB — TROPONIN I (HIGH SENSITIVITY)
Troponin I (High Sensitivity): 10 ng/L (ref <18)
Troponin I (High Sensitivity): 11 ng/L (ref <18)

## 2022-10-08 LAB — MRSA NEXT GEN BY PCR, NASAL: MRSA by PCR Next Gen: NOT DETECTED

## 2022-10-08 LAB — AMMONIA: Ammonia: 16 umol/L (ref 9–35)

## 2022-10-08 LAB — ETHANOL: Alcohol, Ethyl (B): <10 mg/dL (ref <10)

## 2022-10-08 LAB — SALICYLATE LEVEL: Salicylate Lvl: <7 mg/dL — ABNORMAL LOW (ref 7.0–30.0)

## 2022-10-08 MED ORDER — FAMOTIDINE 20 MG PO TABS: 20.0000 mg | ORAL_TABLET | Freq: Two times a day (BID) | ORAL | Status: DC

## 2022-10-08 MED ORDER — ONDANSETRON HCL 4 MG/2ML IJ SOLN
4.0000 mg | Freq: Four times a day (QID) | INTRAMUSCULAR | Status: DC | PRN
  Administered 2022-10-08: 4 mg via INTRAVENOUS
  Filled 2022-10-08: qty 2

## 2022-10-08 MED ORDER — POLYETHYLENE GLYCOL 3350 17 GM/SCOOP PO POWD: 17.0000 g | Freq: Two times a day (BID) | ORAL | Status: DC | PRN

## 2022-10-08 MED ORDER — ATORVASTATIN CALCIUM 40 MG PO TABS
40.0000 mg | ORAL_TABLET | Freq: Every day | ORAL | Status: DC
  Administered 2022-10-09 – 2022-10-12 (×4): 40 mg via ORAL
  Filled 2022-10-08 (×4): qty 1

## 2022-10-08 MED ORDER — FAMOTIDINE 20 MG PO TABS
20.0000 mg | ORAL_TABLET | Freq: Every day | ORAL | Status: DC
  Administered 2022-10-08 – 2022-10-11 (×4): 20 mg via ORAL
  Filled 2022-10-08 (×4): qty 1

## 2022-10-08 MED ORDER — LIP MEDEX EX OINT
1.0000 | TOPICAL_OINTMENT | CUTANEOUS | Status: DC | PRN
  Filled 2022-10-08: qty 7

## 2022-10-08 MED ORDER — NALOXONE HCL 0.4 MG/ML IJ SOLN: 0.4000 mg | Freq: Once | INTRAMUSCULAR | Status: DC

## 2022-10-08 MED ORDER — GABAPENTIN 100 MG PO CAPS
100.0000 mg | ORAL_CAPSULE | Freq: Once | ORAL | Status: AC
  Administered 2022-10-08: 100 mg via ORAL
  Filled 2022-10-08: qty 1

## 2022-10-08 MED ORDER — SODIUM CHLORIDE 0.9 % IV SOLN
1.0000 g | INTRAVENOUS | Status: DC
  Administered 2022-10-08 – 2022-10-12 (×5): 1 g via INTRAVENOUS
  Filled 2022-10-08 (×6): qty 10

## 2022-10-08 MED ORDER — SODIUM CHLORIDE 0.9 % IV BOLUS
500.0000 mL | Freq: Once | INTRAVENOUS | Status: AC
  Administered 2022-10-08: 500 mL via INTRAVENOUS

## 2022-10-08 MED ORDER — OXYCODONE-ACETAMINOPHEN 10-325 MG PO TABS: 1.0000 | ORAL_TABLET | Freq: Four times a day (QID) | ORAL | Status: DC | PRN

## 2022-10-08 MED ORDER — ENOXAPARIN SODIUM 30 MG/0.3ML IJ SOSY: 30.0000 mg | PREFILLED_SYRINGE | INTRAMUSCULAR | Status: DC

## 2022-10-08 MED ORDER — EZETIMIBE 10 MG PO TABS
10.0000 mg | ORAL_TABLET | Freq: Every day | ORAL | Status: DC
  Administered 2022-10-09 – 2022-10-12 (×4): 10 mg via ORAL
  Filled 2022-10-08 (×4): qty 1

## 2022-10-08 MED ORDER — SENNOSIDES-DOCUSATE SODIUM 8.6-50 MG PO TABS: 1.0000 | ORAL_TABLET | Freq: Two times a day (BID) | ORAL | Status: DC | PRN

## 2022-10-08 MED ORDER — DEXTROSE 50 % IV SOLN: 0.0000 mL | INTRAVENOUS | Status: DC | PRN

## 2022-10-08 MED ORDER — DEXTROSE-SODIUM CHLORIDE 5-0.45 % IV SOLN: INTRAVENOUS | Status: AC

## 2022-10-08 MED ORDER — OXYCODONE-ACETAMINOPHEN 5-325 MG PO TABS: 1.0000 | ORAL_TABLET | Freq: Four times a day (QID) | ORAL | Status: DC | PRN

## 2022-10-08 MED ORDER — NALOXONE HCL 0.4 MG/ML IJ SOLN
0.4000 mg | Freq: Once | INTRAMUSCULAR | Status: AC
  Administered 2022-10-08: 0.4 mg via INTRAVENOUS
  Filled 2022-10-08: qty 1

## 2022-10-08 MED ORDER — SODIUM CHLORIDE 0.9% FLUSH: 10.0000 mL | INTRAVENOUS | Status: DC | PRN

## 2022-10-08 MED ORDER — ONDANSETRON HCL 4 MG PO TABS: 4.0000 mg | ORAL_TABLET | Freq: Four times a day (QID) | ORAL | Status: DC | PRN

## 2022-10-08 MED ORDER — LACTATED RINGERS IV SOLN: INTRAVENOUS | Status: DC

## 2022-10-08 MED ORDER — CHLORHEXIDINE GLUCONATE CLOTH 2 % EX PADS
6.0000 | MEDICATED_PAD | Freq: Every day | CUTANEOUS | Status: DC
  Administered 2022-10-08 – 2022-10-12 (×5): 6 via TOPICAL

## 2022-10-08 MED ORDER — METOPROLOL TARTRATE 50 MG PO TABS
50.0000 mg | ORAL_TABLET | Freq: Two times a day (BID) | ORAL | Status: DC
  Administered 2022-10-08 – 2022-10-11 (×7): 50 mg via ORAL
  Filled 2022-10-08 (×2): qty 1
  Filled 2022-10-08 (×3): qty 2
  Filled 2022-10-08 (×2): qty 1
  Filled 2022-10-08: qty 2

## 2022-10-08 MED ORDER — ENOXAPARIN SODIUM 40 MG/0.4ML IJ SOSY: 40.0000 mg | PREFILLED_SYRINGE | INTRAMUSCULAR | Status: DC

## 2022-10-08 MED ORDER — INSULIN ASPART 100 UNIT/ML IJ SOLN
0.0000 [IU] | Freq: Three times a day (TID) | INTRAMUSCULAR | Status: DC
  Administered 2022-10-08: 8 [IU] via SUBCUTANEOUS
  Filled 2022-10-08: qty 0.15

## 2022-10-08 MED ORDER — SODIUM CHLORIDE 0.9 % IV SOLN: INTRAVENOUS | Status: AC

## 2022-10-08 MED ORDER — OXYCODONE HCL 5 MG PO TABS: 5.0000 mg | ORAL_TABLET | Freq: Four times a day (QID) | ORAL | Status: DC | PRN

## 2022-10-08 MED ORDER — ALBUTEROL SULFATE (2.5 MG/3ML) 0.083% IN NEBU: 2.5000 mg | INHALATION_SOLUTION | RESPIRATORY_TRACT | Status: DC | PRN

## 2022-10-08 MED ORDER — LEVOTHYROXINE SODIUM 50 MCG PO TABS
75.0000 ug | ORAL_TABLET | Freq: Every day | ORAL | Status: DC
  Administered 2022-10-09 – 2022-10-12 (×4): 75 ug via ORAL
  Filled 2022-10-08 (×4): qty 1

## 2022-10-08 MED ORDER — ACETAMINOPHEN 650 MG RE SUPP: 650.0000 mg | Freq: Four times a day (QID) | RECTAL | Status: DC | PRN

## 2022-10-08 MED ORDER — NALOXONE HCL 4 MG/10ML IJ SOLN
0.2500 mg/h | INTRAVENOUS | Status: DC
  Administered 2022-10-08: 1 mg/h via INTRAVENOUS
  Administered 2022-10-08: 0.25 mg/h via INTRAVENOUS
  Administered 2022-10-09 (×2): 1 mg/h via INTRAVENOUS
  Filled 2022-10-08 (×10): qty 10

## 2022-10-08 MED ORDER — ASPIRIN 81 MG PO TBEC
81.0000 mg | DELAYED_RELEASE_TABLET | Freq: Every day | ORAL | Status: DC
  Administered 2022-10-09 – 2022-10-11 (×3): 81 mg via ORAL
  Filled 2022-10-08 (×3): qty 1

## 2022-10-08 MED ORDER — ACETAMINOPHEN 325 MG PO TABS
650.0000 mg | ORAL_TABLET | Freq: Four times a day (QID) | ORAL | Status: DC | PRN
  Administered 2022-10-09 – 2022-10-12 (×12): 650 mg via ORAL
  Filled 2022-10-08 (×13): qty 2

## 2022-10-08 MED ORDER — SODIUM CHLORIDE 0.9 % IV SOLN: INTRAVENOUS | Status: DC | PRN

## 2022-10-08 MED ORDER — AMLODIPINE BESYLATE 10 MG PO TABS
10.0000 mg | ORAL_TABLET | Freq: Every day | ORAL | Status: DC
  Administered 2022-10-09 – 2022-10-12 (×4): 10 mg via ORAL
  Filled 2022-10-08 (×4): qty 1

## 2022-10-08 MED ORDER — INSULIN REGULAR(HUMAN) IN NACL 100-0.9 UT/100ML-% IV SOLN
INTRAVENOUS | Status: DC
  Administered 2022-10-08: 5.5 [IU]/h via INTRAVENOUS
  Filled 2022-10-08: qty 100

## 2022-10-08 MED ORDER — INSULIN ASPART 100 UNIT/ML IJ SOLN
0.0000 [IU] | Freq: Every day | INTRAMUSCULAR | Status: DC
  Filled 2022-10-08: qty 0.05

## 2022-10-08 MED ORDER — HEPARIN SODIUM (PORCINE) 5000 UNIT/ML IJ SOLN
5000.0000 [IU] | Freq: Three times a day (TID) | INTRAMUSCULAR | Status: DC
  Administered 2022-10-08 – 2022-10-12 (×11): 5000 [IU] via SUBCUTANEOUS
  Filled 2022-10-08 (×11): qty 1

## 2022-10-08 NOTE — Progress Notes (Addendum)
eLink Physician-Brief Progress Note Patient Name: FREE REISSIG DOB: 05/31/1950 MRN: [id number]   Date of Service  10/08/2022  HPI/Events of Note  Brief new admit note:  Camera: On nasal o2, family talking to her. Confused. Able to protect airways. Hemodynamically stable. On nasal o2.  Data: Reviewed Trop 11 CBG improving Cr 2.3, K at 5 Elevated ion calcium from CKD, secondary hyperparathyroidism?. Hg at 9.8 EKG sinus, no acute ST T changes CTH neg for any acute changes CxR: cardiomegaly. 7.36/48/204/27 Wbc 11.2 UA neg. Tox opiates +  A/P:  72 yr old female with hx of IDDM, HTN, Opioid dependence in ICU for  Encephalopathy on narcan drip. - asp and sz precautions.  2. Type 2 DM, hyperglycemia.  on Insuline drip.   3. Aspiration in ED. On ceftriaxone. On nasal o2. Sats ok.  4. AKI on CKD From above. Follow UOP, keep MAP > 65. Avoid nephrotoxic meds. Hold ARB.   5. Hypothyroidism. Resume once stable mentation.   5. VTE: sq heparin.     eICU Interventions  6. Has a porta cath. Reason not clear, mostly for her Iron deficiency anemia in recent past.       Intervention Category Major Interventions: Other:;Acute renal failure - evaluation and management Evaluation Type: New Patient Evaluation  Ranee Gosselin 10/08/2022, 9:38 PM  22:38 Pt on ins/ endo tool, last CBG was 225. Only IVF order is for LR @ 100. - switch to D5 half saline at 75 ml/hr   Also Pt c/o chronic leg pain, takes gabapentin 200 mg PO Q6 hrs at home. - gabapentin 100 mg oral ordered once for now  03:46 Pt BP HTN 193/65 (116) received Lopressor at 2200 but doesn't have any PRN for HTN. AKI - hydralazine prn ordered  5:05 Pt on ins/ endo tool meets criteria to transition to SSI d/t CBG < 180 and ins @ 1.7 Phase 3 transition ordered. Discussed with RN. Watch for hypoglycemia.  NPO status.

## 2022-10-08 NOTE — Progress Notes (Signed)
eLink Physician-Brief Progress Note Patient Name: Bethany [name] DOB: 1950/11/28 MRN: [id number]   Date of Service  10/08/2022  HPI/Events of Note  Pt noted to have a port-a-cath and 3 PIV. BSRN asking for order for IV team to access port a cath d/t multiple IV meds that aren't compatible. Hx of FEDA.     eICU Interventions  On multiple drips and not compatible, so advised ok to use porta cath.      Intervention Category Intermediate Interventions: Other:  Ranee Gosselin 10/08/2022, 9:33 PM

## 2022-10-08 NOTE — ED Notes (Signed)
CBG WAS 368

## 2022-10-08 NOTE — Progress Notes (Signed)
Called lab to let them know abg was tubed to lab

## 2022-10-08 NOTE — ED Triage Notes (Signed)
Patient BIB EMS from home for AMS. Patient went to [facility] today and when she got home she was lethargic and not responding. Patient has done this before with DKA and has hx CVA x 3. Patient on non rebreather.   Per EMS CBG 460, 415

## 2022-10-08 NOTE — Progress Notes (Signed)
PHARMACY NOTE:  Renal Function:  SCr 2.3, no weight to calculate CrCl eGFR 22 ml/min per EPic  CrCl cannot be calculated (Unknown ideal weight.).       Plan: Lovenox 40 qday> Lovenox 30 qday Famotidine 20 bid>> famotidine 20 qday   Thank you for allowing pharmacy to be a part of this patient's care.  Herby Abraham, Pharm.D Use secure chat for questions 10/08/2022 4:59 PM

## 2022-10-08 NOTE — ED Notes (Signed)
Took the critical venous blood gas po2 <31 from lab. Lab resulted it as arterial. They are unable to cancel the result at this moment. When the arterial lab is drawn from RT lab will run it and result the arterial. Dr. Fredderick Phenix notified.

## 2022-10-08 NOTE — Consult Note (Signed)
NAME:  KAITHLYN BESA, MRN:  [id number], DOB:  10-07-1950, LOS: 0 ADMISSION DATE:  10/08/2022, CONSULTATION DATE:  10/08/22  REFERRING MD:  TRH, CHIEF COMPLAINT:  encephalopathy   History of Present Illness:  72 year old woman admitted to the hospital with encephalopathy improved after Narcan with subsequent return of encephalopathy, nausea vomiting, hyperglycemia, acute renal failure.  Unable to obtain history from patient due to encephalopathy.  No other family at bedside.  Per chart review and family patient with blacking out spells, syncope for the last several weeks.  She was noted to be progressively somnolent throughout the course of the day.  Then vomited.  Brought to the ER.  Not following commands.  Given doses of current head workup complains of being in pain.  Again some again subsequent Narcan again with pain, chest pain etc.  Exam with depression of mental status again.  Labs reveal acute kidney failure, AKI.  She endorses taking chronic pain pills.  When asked specific names of opiate she said yes, Norco, codeine etc.  It appears she was prescribed Percocet in the past.  Pertinent  Medical History  Insulin-dependent diabetes, hypertension, opioid dependence  Significant Hospital Events: Including procedures, antibiotic start and stop dates in addition to other pertinent events   5/2 5 admitted to the hospital with encephalopathy shorten improvement with normal, was recurrent encephalopathy, nausea vomiting, hyperglycemia, AKI  Interim History / Subjective:    Objective   Blood pressure (!) 167/68, pulse 86, temperature 98.1 F (36.7 C), temperature source Rectal, resp. rate (!) 21, SpO2 97 %.       No intake or output data in the 24 hours ending 10/08/22 1840 There were no vitals filed for this visit.  Examination: General: Chronically ill appearance sitting up in stretcher HENT: Secretions in mouth, atraumatic Lungs: Rhonchorous throughout Cardiovascular: Tachycardic,  warm Abdomen: Nondistended bowel sounds present Neuro: Encephalopathic, would not follow commands, protecting airway, easily arousable on Narcan drip   Resolved Hospital Problem list     Assessment & Plan:  Encephalopathy: Presumed toxic encephalopathy accumulation of opiate narcotics given improvement although short-lived with Narcan.  Also with renal failure as a possible contributing factor. -- Narcan drip, increase rate -- Minimize centrally acting medications  Aspiration: With vomiting in the ED. -- Ceftriaxone  Hyperglycemia: Baseline diabetes. -- Start IV insulin infusion for hyperglycemia protocol  Nausea/vomiting: On Trulicity, possible gastroparesis insulin for diabetes: Also likely related to hyperglycemia -- Hyperglycemia control -- IV antiemetics  AKI: Unclear etiology.  Suspect type hypovolemia in the setting of hyperglycemia. -- Hold losartan -- IV fluids    Best Practice (right click and "Reselect all SmartList Selections" daily)   Diet/type: NPO DVT prophylaxis: prophylactic heparin  GI prophylaxis: N/A Lines: N/A Foley:  N/A Code Status:  full code Last date of multidisciplinary goals of care discussion [unable to discuss with family]  Labs   CBC: Recent Labs  Lab 10/08/22 1413 10/08/22 1426  WBC 11.2*  --   NEUTROABS 9.5*  --   HGB 9.1* 9.9*  HCT 31.3* 29.0*  MCV 77.3*  --   PLT 297  --     Basic Metabolic Panel: Recent Labs  Lab 10/08/22 1413 10/08/22 1426  NA 134* 133*  K 5.0 5.0  CL 99 101  CO2 24  --   GLUCOSE 384* 376*  BUN 37* 34*  CREATININE 2.30* 2.30*  CALCIUM 10.1  --    GFR: CrCl cannot be calculated (Unknown ideal weight.). Recent Labs  Lab  10/08/22 1413 10/08/22 1414  WBC 11.2*  --   LATICACIDVEN  --  1.4    Liver Function Tests: Recent Labs  Lab 10/08/22 1413  AST 25  ALT 25  ALKPHOS 166*  BILITOT 0.5  PROT 7.6  ALBUMIN 4.0   No results for input(s): "LIPASE", "AMYLASE" in the last 168  hours. Recent Labs  Lab 10/08/22 1414  AMMONIA 16    ABG    Component Value Date/Time   PHART 7.36 10/08/2022 1418   PCO2ART 48 10/08/2022 1418   PO2ART 204 (H) 10/08/2022 1418   HCO3 27.1 10/08/2022 1418   TCO2 26 10/08/2022 1426   O2SAT 100 10/08/2022 1418     Coagulation Profile: Recent Labs  Lab 10/08/22 1413  INR 0.9    Cardiac Enzymes: No results for input(s): "CKTOTAL", "CKMB", "CKMBINDEX", "TROPONINI" in the last 168 hours.  HbA1C: Hgb A1c MFr Bld  Date/Time Value Ref Range Status  07/12/2021 05:03 AM 5.2 4.8 - 5.6 % Final    Comment:    (NOTE) Pre diabetes:          5.7%-6.4%  Diabetes:              >6.4%  Glycemic control for   <7.0% adults with diabetes   05/04/2020 04:58 PM 4.7 (L) 4.8 - 5.6 % Final    Comment:    (NOTE) Pre diabetes:          5.7%-6.4%  Diabetes:              >6.4%  Glycemic control for   <7.0% adults with diabetes     CBG: Recent Labs  Lab 10/08/22 1357 10/08/22 1714  GLUCAP 368* 280*    Review of Systems:   Unobtainable to obtain due to patient factors  Past Medical History:  She,  has a past medical history of Acute renal failure (HCC), Anemia, Arthritis, COPD (chronic obstructive pulmonary disease) (HCC), Diabetes mellitus without complication (HCC), GERD (gastroesophageal reflux disease), Hypercalcemia, Hyperlipidemia, Hypertension, Pneumonia, Renal disorder, Single kidney, Stroke (HCC), Thrombosis, and Tobacco abuse.   Surgical History:   Past Surgical History:  Procedure Laterality Date   ABDOMINAL HYSTERECTOMY     blood clots removed from descending aorta      BREAST BIOPSY Right 07/28/2021   BREAST BIOPSY Right 08/17/2021   BREAST LUMPECTOMY Right 01/25/2021   BREAST LUMPECTOMY WITH RADIOACTIVE SEED LOCALIZATION Right 01/25/2021   Procedure: RIGHT BREAST LUMPECTOMY WITH RADIOACTIVE SEED LOCALIZATION;  Surgeon: Abigail Miyamoto, MD;  Location:  SUR[facility]ERY [facility];  Service: General;  Laterality:  Right;   CHOLECYSTECTOMY     COLONOSCOPY     COLONOSCOPY WITH PROPOFOL N/A 04/17/2017   Procedure: COLONOSCOPY WITH PROPOFOL;  Surgeon: Charlott Rakes, MD;  Location: [facility];  Service: Endoscopy;  Laterality: N/A;   ESOPHAGOGASTRODUODENOSCOPY (EGD) WITH PROPOFOL N/A 04/17/2017   Procedure: ESOPHAGOGASTRODUODENOSCOPY (EGD) WITH PROPOFOL;  Surgeon: Charlott Rakes, MD;  Location: [facility];  Service: Endoscopy;  Laterality: N/A;   IR 3D INDEPENDENT WKST  05/04/2020   IR ANGIO INTRA EXTRACRAN SEL COM CAROTID INNOMINATE BILAT MOD SED  02/06/2020   IR ANGIO INTRA EXTRACRAN SEL INTERNAL CAROTID UNI L MOD SED  05/04/2020   IR ANGIO VERTEBRAL SEL VERTEBRAL BILAT MOD SED  02/06/2020   IR ANGIOGRAM FOLLOW UP STUDY  05/04/2020   IR IMAGING GUIDED PORT INSERTION  05/01/2019   IR NEURO EACH ADD'L AFTER BASIC UNI LEFT (MS)  05/04/2020   IR RADIOLOGIST EVAL & MGMT  05/28/2020  IR TRANSCATH/EMBOLIZ  05/04/2020   IR US GUIDE VASC ACCESS RIGHT  02/06/2020   IR US GUIDE VASC ACCESS RIGHT  05/04/2020   LESION REMOVAL Left 01/25/2021   Procedure: ABNORMAL SKIN LESION REMOVAL LEFT ABDOMINAL WALL;  Surgeon: Abigail Miyamoto, MD;  Location: [facility];  Service: General;  Laterality: Left;   NEPHRECTOMY RECIPIENT     ORIF ANKLE FRACTURE N/A 07/13/2021   Procedure: OPEN REDUCTION INTERNAL FIXATION (ORIF) ANKLE FRACTURE;  Surgeon: Ernestina Columbia, MD;  Location: [facility];  Service: Orthopedics;  Laterality: N/A;   RADIOLOGY WITH ANESTHESIA N/A 05/04/2020   Procedure: Vertis Kelch;  Surgeon: Julieanne Cotton, MD;  Location: [facility];  Service: Radiology;  Laterality: N/A;     Social History:   reports that she has been smoking cigarettes. She has a 10.00 pack-year smoking history. She has never used smokeless tobacco. She reports current alcohol use. She reports that she does not use drugs.   Family History:  Her family history includes Breast cancer in her maternal aunt and sister;  Breast cancer (age of onset: 39) in her maternal aunt; Diabetes in her father; Hypertension in her brother and mother.   Allergies Allergies  Allergen Reactions   Contrast Media [Iodinated Contrast Media]     Renal hypertension per physician   Other Other (See Comments)    No ASA/Anti-Coag due to GI bleed     Home Medications  Prior to Admission medications   Medication Sig Start Date End Date Taking? Authorizing Provider  allopurinol (ZYLOPRIM) 100 MG tablet Take 100 mg by mouth at bedtime.   Yes [provider]  amLODipine (NORVASC) 10 MG tablet Take 10 mg by mouth daily.   Yes [provider]  aspirin EC 81 MG EC tablet Take 1 tablet (81 mg total) by mouth daily. Swallow whole. 11/22/19  Yes Danford, Earl Lites, MD  atorvastatin (LIPITOR) 40 MG tablet Take 40 mg by mouth daily. 07/06/21  Yes [provider]  baclofen (LIORESAL) 10 MG tablet Take 20 mg by mouth 3 (three) times daily as needed for muscle spasms.   Yes [provider]  Dulaglutide (TRULICITY) 1.5 MG/0.5ML SOPN Inject 1.5 mg into the skin every Monday.    Yes [provider]  ezetimibe (ZETIA) 10 MG tablet Take 10 mg by mouth daily.   Yes [provider]  famotidine (PEPCID) 20 MG tablet Take 20 mg by mouth 2 (two) times daily. 07/06/21  Yes [provider]  feeding supplement, ENSURE ENLIVE, (ENSURE ENLIVE) LIQD Take 237 mLs by mouth 2 (two) times daily between meals. 03/30/19  Yes Berton Mount I, MD  furosemide (LASIX) 40 MG tablet Take 40 mg by mouth daily as needed for fluid or edema. 04/08/20  Yes [provider]  gabapentin (NEURONTIN) 100 MG capsule Take 2 capsules (200 mg total) by mouth 4 (four) times daily. 05/11/20  Yes Louk, Alexandra M, PA-C  insulin glargine, 1 Unit Dial, (TOUJEO SOLOSTAR) 300 UNIT/ML Solostar Pen Inject 20 Units into the skin at bedtime.    Yes [provider]  insulin lispro (HUMALOG) 100 UNIT/ML KwikPen  Inject 5 Units into the skin 3 (three) times daily as needed (blood sugar >200). 11/09/20  Yes [provider]  Ipratropium-Albuterol (COMBIVENT) 20-100 MCG/ACT AERS respimat Inhale 1 puff into the lungs every 6 (six) hours as needed for wheezing or shortness of breath. 03/17/17  Yes Hongalgi, Maximino Greenland, MD  levothyroxine (SYNTHROID) 75 MCG tablet Take 75 mcg by mouth daily.  02/28/19  Yes [provider]  lidocaine-prilocaine (EMLA) cream APPLY 2-5 cms of cream TO port site 45 MINUTES - ONE HOUR prior TO access 04/01/22  Yes Malachy Mood, MD  losartan (COZAAR) 25 MG tablet Take 1 tablet (25 mg total) by mouth daily. 11/22/19 10/08/22 Yes Danford, Earl Lites, MD  metoprolol (LOPRESSOR) 50 MG tablet Take 50 mg by mouth 2 (two) times daily.  03/06/14  Yes [provider]  NARCAN 4 MG/0.1ML LIQD nasal spray kit Place 1 spray into the nose as needed for opioid reversal. 08/22/19  Yes [provider]  Omega-3 Fatty Acids (FISH OIL PO) Take 1 capsule by mouth 2 (two) times daily.   Yes [provider]  oxyCODONE-acetaminophen (PERCOCET) 10-325 MG tablet Take 1 tablet by mouth every 6 (six) hours as needed for pain. 02/18/22  Yes [provider]  pantoprazole (PROTONIX) 40 MG tablet Take 40 mg by mouth every morning.   Yes [provider]  polyethylene glycol powder (MIRALAX) 17 GM/SCOOP powder Take 17 g by mouth 2 (two) times daily as needed for moderate constipation. 07/16/21  Yes Almon Hercules, MD  senna-docusate (SENOKOT-S) 8.6-50 MG tablet Take 1 tablet by mouth 2 (two) times daily between meals as needed for mild constipation. 07/16/21  Yes Almon Hercules, MD  Tetrahydrozoline HCl (VISINE OP) Place 1 drop into both eyes 3 (three) times daily as needed (dry eyes).   Yes [provider]     Critical care time:    CRITICAL CARE Performed by: Karren Burly   Total critical care time: 35 minutes  Critical care time was exclusive of  separately billable procedures and treating other patients.  Critical care was necessary to treat or prevent imminent or life-threatening deterioration.  Critical care was time spent personally by me on the following activities: development of treatment plan with patient and/or surrogate as well as nursing, discussions with consultants, evaluation of patient's response to treatment, examination of patient, obtaining history from patient or surrogate, ordering and performing treatments and interventions, ordering and review of laboratory studies, ordering and review of radiographic studies, pulse oximetry and re-evaluation of patient's condition.

## 2022-10-08 NOTE — ED Provider Notes (Addendum)
[facility] AT [facility] Provider Note   CSN: [id number] Arrival date & time: 10/08/22  1352     History  No chief complaint on file.   Bethany [name] is a 72 y.o. female.  Patient is a 73 year old female who presents with altered mental status.  History is obtained by EMS due to her altered mental status.  Per chart review, she has a history of hypertension, diabetes, chronic kidney disease, COPD, hyperlipidemia, brain aneurysm and an intestinal AVM which resulted in chronic blood loss.  Per EMS report, she had gone to a cemetery to visit a gravesite earlier today and when she got to a family member's house, she was noted to have altered mental status.  She was confused and became less alert.  It is unclear how she got from the cemetery to her family members house.  EMS had noted that her blood sugars were elevated in the 400 range.  No other history was obtained.  15:27 further information was obtained from the patient's daughter who is not bedside.  She states that this morning patient and her aunt had gone to a cemetery to place flowers.  When they got home, patient seems sleepy and as the morning progressed, got less alert.  She had 1 episode of vomiting.  The daughter states that she does drink alcohol from time to time and sometimes she will drink 2 or 3 drinks at a time but the last drink was unknown.  She denies any known drug use or depression where she would be concerned about an overdose.  She does report that the patient has had these blackout spells for the last month or 2.  Most recently, patient was found asleep in her car by a neighbor with the door open but she was sitting in her car asleep.       Home Medications Prior to Admission medications   Medication Sig Start Date End Date Taking? Authorizing Provider  allopurinol (ZYLOPRIM) 100 MG tablet Take 100 mg by mouth at bedtime.    [provider]  amLODipine (NORVASC) 10 MG tablet  Take 10 mg by mouth daily.    [provider]  aspirin EC 81 MG EC tablet Take 1 tablet (81 mg total) by mouth daily. Swallow whole. 11/22/19   Danford, Earl Lites, MD  atorvastatin (LIPITOR) 40 MG tablet Take 40 mg by mouth daily. 07/06/21   [provider]  baclofen (LIORESAL) 10 MG tablet Take 20 mg by mouth 3 (three) times daily as needed for muscle spasms.    [provider]  Dulaglutide (TRULICITY) 1.5 MG/0.5ML SOPN Inject 1.5 mg into the skin every Monday.     [provider]  ezetimibe (ZETIA) 10 MG tablet Take 10 mg by mouth daily.    [provider]  famotidine (PEPCID) 20 MG tablet Take 20 mg by mouth 2 (two) times daily. 07/06/21   [provider]  feeding supplement, ENSURE ENLIVE, (ENSURE ENLIVE) LIQD Take 237 mLs by mouth 2 (two) times daily between meals. 03/30/19   Barnetta Chapel, MD  furosemide (LASIX) 40 MG tablet Take 40 mg by mouth daily as needed for fluid or edema. 04/08/20   [provider]  gabapentin (NEURONTIN) 100 MG capsule Take 2 capsules (200 mg total) by mouth 4 (four) times daily. 05/11/20   Louk, Waylan Boga, PA-C  insulin glargine, 1 Unit Dial, (TOUJEO SOLOSTAR) 300 UNIT/ML Solostar Pen Inject 20 Units into the skin at  bedtime.     [provider]  insulin lispro (HUMALOG) 100 UNIT/ML KwikPen Inject 5 Units into the skin 3 (three) times daily as needed (blood sugar >200). Patient not taking: Reported on 03/09/2022 11/09/20   [provider]  Ipratropium-Albuterol (COMBIVENT) 20-100 MCG/ACT AERS respimat Inhale 1 puff into the lungs every 6 (six) hours as needed for wheezing or shortness of breath. 03/17/17   Hongalgi, Maximino Greenland, MD  levothyroxine (SYNTHROID) 75 MCG tablet Take 75 mcg by mouth daily. 02/28/19   [provider]  lidocaine-prilocaine (EMLA) cream APPLY 2-5 cms of cream TO port site 45 MINUTES - ONE HOUR prior TO access 04/01/22   Malachy Mood, MD  losartan (COZAAR)  25 MG tablet Take 1 tablet (25 mg total) by mouth daily. 11/22/19 08/21/21  Danford, Earl Lites, MD  metoprolol (LOPRESSOR) 50 MG tablet Take 50 mg by mouth 2 (two) times daily.  03/06/14   [provider]  NARCAN 4 MG/0.1ML LIQD nasal spray kit Place 1 spray into the nose as needed for opioid reversal. 08/22/19   [provider]  Omega-3 Fatty Acids (FISH OIL PO) Take 1 capsule by mouth 2 (two) times daily.    [provider]  oxyCODONE ER (XTAMPZA ER) 27 MG C12A     [provider]  oxyCODONE-acetaminophen (PERCOCET) 10-325 MG tablet Take 1 tablet by mouth every 6 (six) hours as needed. 02/18/22   [provider]  pantoprazole (PROTONIX) 40 MG tablet Take 40 mg by mouth every morning.    [provider]  polyethylene glycol powder (MIRALAX) 17 GM/SCOOP powder Take 17 g by mouth 2 (two) times daily as needed for moderate constipation. 07/16/21   Almon Hercules, MD  senna-docusate (SENOKOT-S) 8.6-50 MG tablet Take 1 tablet by mouth 2 (two) times daily between meals as needed for mild constipation. 07/16/21   Almon Hercules, MD  Tetrahydrozoline HCl (VISINE OP) Place 1 drop into both eyes 3 (three) times daily as needed (dry eyes).    [provider]      Allergies    Contrast media [iodinated contrast media] and Other    Review of Systems   Review of Systems  Unable to perform ROS: Mental status change    Physical Exam Updated Vital Signs BP (!) 110/50   Pulse (!) 52   Temp 98.1 F (36.7 C) (Rectal)   Resp 11   SpO2 99%  Physical Exam Constitutional:      Appearance: She is well-developed.     Comments: Asleep with snoring respirations but will wake up with a sternal rub.  When she wakes up with a sternal rub, she says "I am in a hospital" but that is the only thing that she will say.  HENT:     Head: Normocephalic and atraumatic.  Eyes:     Pupils: Pupils are equal, round, and reactive to light.  Cardiovascular:     Rate and  Rhythm: Normal rate and regular rhythm.     Heart sounds: Normal heart sounds.  Pulmonary:     Effort: Pulmonary effort is normal. No respiratory distress.     Breath sounds: Normal breath sounds. No wheezing or rales.  Chest:     Chest wall: No tenderness.  Abdominal:     General: Bowel sounds are normal.     Palpations: Abdomen is soft.     Tenderness: There is no abdominal tenderness. There is no guarding or rebound.  Musculoskeletal:  General: Normal range of motion.     Cervical back: Normal range of motion and neck supple.  Lymphadenopathy:     Cervical: No cervical adenopathy.  Skin:    General: Skin is warm and dry.     Findings: No rash.  Neurological:     Comments: Responsive to painful stimuli, she will gently squeeze my hands but otherwise is not following commands.  She is wiggling both of her toes symmetrically.     ED Results / Procedures / Treatments   Labs (all labs ordered are listed, but only abnormal results are displayed) Labs Reviewed  CBC WITH DIFFERENTIAL/PLATELET - Abnormal; Notable for the following components:      Result Value   WBC 11.2 (*)    Hemoglobin 9.1 (*)    HCT 31.3 (*)    MCV 77.3 (*)    MCH 22.5 (*)    MCHC 29.1 (*)    RDW 17.0 (*)    Neutro Abs 9.5 (*)    All other components within normal limits  COMPREHENSIVE METABOLIC PANEL - Abnormal; Notable for the following components:   Sodium 134 (*)    Glucose, Bld 384 (*)    BUN 37 (*)    Creatinine, Ser 2.30 (*)    Alkaline Phosphatase 166 (*)    GFR, Estimated 22 (*)    All other components within normal limits  RAPID URINE DRUG SCREEN, HOSP PERFORMED - Abnormal; Notable for the following components:   Opiates POSITIVE (*)    All other components within normal limits  URINALYSIS, W/ REFLEX TO CULTURE (INFECTION SUSPECTED) - Abnormal; Notable for the following components:   Glucose, UA 150 (*)    Protein, ur 30 (*)    All other components within normal limits  BLOOD GAS,  ARTERIAL - Abnormal; Notable for the following components:   pH, Arterial 7.32 (*)    pCO2 arterial 60 (*)    pO2, Arterial <31 (*)    Bicarbonate 30.9 (*)    Acid-Base Excess 3.8 (*)    All other components within normal limits  BLOOD GAS, ARTERIAL - Abnormal; Notable for the following components:   pO2, Arterial 204 (*)    All other components within normal limits  SALICYLATE LEVEL - Abnormal; Notable for the following components:   Salicylate Lvl <7.0 (*)    All other components within normal limits  ACETAMINOPHEN LEVEL - Abnormal; Notable for the following components:   Acetaminophen (Tylenol), Serum <10 (*)    All other components within normal limits  I-STAT CHEM 8, ED - Abnormal; Notable for the following components:   Sodium 133 (*)    BUN 34 (*)    Creatinine, Ser 2.30 (*)    Glucose, Bld 376 (*)    Calcium, Ion 1.41 (*)    Hemoglobin 9.9 (*)    HCT 29.0 (*)    All other components within normal limits  LACTIC ACID, PLASMA  AMMONIA  ETHANOL  PROTIME-INR  CBG MONITORING, ED  TROPONIN I (HIGH SENSITIVITY)    EKG EKG Interpretation  Date/Time:  Saturday Oct 08 2022 15:34:18 EDT Ventricular Rate:  76 PR Interval:  141 QRS Duration: 88 QT Interval:  394 QTC Calculation: 446 R Axis:   49 Text Interpretation: Sinus rhythm Anterior infarct, possibly acute  similar to prior EKG Confirmed by Rolan Bucco ([id number]) on 10/08/2022 3:37:27 PM  Radiology DG Chest Port 1 View  Result Date: 10/08/2022 CLINICAL DATA:  Altered mental status. EXAM: PORTABLE CHEST 1 VIEW  COMPARISON:  May 03, 2022 FINDINGS: Injectable port terminates at the expected location of the cavoatrial junction. Calcific atherosclerotic disease of the aorta. Mildly enlarged cardiac silhouette. Mediastinal contours appear intact. There is no evidence of focal airspace consolidation, pleural effusion or pneumothorax. Osseous structures are without acute abnormality. Soft tissues are grossly normal.  IMPRESSION: 1. Mildly enlarged cardiac silhouette. 2. No active disease. Electronically Signed   By: Ted Mcalpine M.D.   On: 10/08/2022 15:12   CT Head Wo Contrast  Result Date: 10/08/2022 CLINICAL DATA:  Altered mental status. EXAM: CT HEAD WITHOUT CONTRAST TECHNIQUE: Contiguous axial images were obtained from the base of the skull through the vertex without intravenous contrast. RADIATION DOSE REDUCTION: This exam was performed according to the departmental dose-optimization program which includes automated exposure control, adjustment of the mA and/or kV according to patient size and/or use of iterative reconstruction technique. COMPARISON:  MRI brain dated June 02, 2021. CT head dated May 04, 2020. FINDINGS: Brain: No evidence of acute infarction, hemorrhage, hydrocephalus, extra-axial collection or mass lesion/mass effect. Old infarct in the posterior left temporal lobe again noted. Vascular: Atherosclerotic vascular calcification of the carotid siphons. No hyperdense vessel. Left MCA aneurysm embolization again noted. Skull: Normal. Negative for fracture or focal lesion. Sinuses/Orbits: No acute finding. Other: None. IMPRESSION: 1. No acute intracranial abnormality. 2. Old infarct in the posterior left temporal lobe. Electronically Signed   By: Obie Dredge M.D.   On: 10/08/2022 15:12    Procedures Procedures    Medications Ordered in ED Medications  sodium chloride 0.9 % bolus 500 mL (0 mLs Intravenous Stopped 10/08/22 1529)  naloxone (NARCAN) injection 0.4 mg (0.4 mg Intravenous Given 10/08/22 1528)    ED Course/ Medical Decision Making/ A&P                             Medical Decision Making Amount and/or Complexity of Data Reviewed Labs: ordered. Radiology: ordered.  Risk Prescription drug management. Decision regarding hospitalization.   Patient is a 72 year old female who presents with altered mental status.  She is unresponsive other than to painful stimuli  on arrival.  Head CT does not show any acute abnormality.  Chest x-ray was interpreted by me and confirmed by the radiologist to show no acute abnormality.  No pneumonia.  No pneumothorax.  No pulmonary edema.  No evidence of aspiration.  Her glucose is elevated in the 300 range but there is not other suggestions of DKA.  Her EtOH level was normal.  Her urine drug screen is positive for opioids.  On chart review, it does appear that she is on oxycodone and Xtampza ER.  She was given a dose of Narcan and did become more alert.  However she still confused.  Also on her blood work, she has evidence of an AKI.  Potentially this is causing an increased effect of her opioids.  She was afebrile.  I do not see any other suggestions of infection.  Her urine was not suggestive of infection.  At this point, the patient does seem to be protecting her airway and does not have an indication for emergent intubation.  I spoke with the hospitalist, Dr. Erenest Blank, who will admit the pt further treatment.  **Of note, her initial ABG was actually a VBG that was from [place] the lab as an ABG.  Subsequent ABG appeared normal.  CRITICAL CARE Performed by: Rolan Bucco Total critical care time: 60 minutes Critical care  time was exclusive of separately billable procedures and treating other patients. Critical care was necessary to treat or prevent imminent or life-threatening deterioration. Critical care was time spent personally by me on the following activities: development of treatment plan with patient and/or surrogate as well as nursing, discussions with consultants, evaluation of patient's response to treatment, examination of patient, obtaining history from patient or surrogate, ordering and performing treatments and interventions, ordering and review of laboratory studies, ordering and review of radiographic studies, pulse oximetry and re-evaluation of patient's condition.   Final Clinical Impression(s) / ED  Diagnoses Final diagnoses:  Altered mental status, unspecified altered mental status type  AKI (acute kidney injury) [facility])    Rx / DC Orders ED Discharge Orders     None         Rolan Bucco, MD 10/08/22 1556    Rolan Bucco, MD 10/08/22 1558

## 2022-10-08 NOTE — H&P (Addendum)
History and Physical  ELA CHAMPION ZOX:[id number] DOB: [birth year] DOA: 10/08/2022  PCP: [name], NP   Chief Complaint: Altered mental status  HPI: Bethany [name] is a 72 y.o. female with medical history significant for hypothyroidism, CKD, insulin-dependent type 2 diabetes, chronic narcotics being admitted to the hospital with altered mental status.  Patient is still somnolent unable to provide history, history is obtained from ER provider, bedside ER nurse, and review of the patient's chart. I also spoke with her daughter Zenon Mayo later at the bedside.  Apparently for the last several weeks she has been having "blacking out "episodes, for example was found by her neighbor outside her house at her house sleep in her car with the door open.  In any case, this morning patient went with her relative to agreed to place some flowers, later was spending time with family was noted to become progressively somnolent during the morning, and vomited once.  No fevers or chills.  No other complaints by the patient.  She was brought to the ER for evaluation where she was noted to be quite somnolent, unable to stay awake to provide any history.  Lab work was done which showed stable vital signs, patient was arousable but it was difficult, moving all extremities.  Not consistently following commands.  Lab work showed elevated creatinine from baseline.  Otherwise relatively unremarkable.  She was given a dose of Narcan and woke up, complains of being pain.  Currently she has become somnolent again, but per nursing staff not has sleepy as she was on first arrival.  Review of Systems: Please see HPI for pertinent positives and negatives. A complete 10 system review of systems could not be completed with the patient due to her somnolence/AMS.  Past Medical History:  Diagnosis Date   Acute renal failure (HCC)    Anemia    Arthritis    COPD (chronic obstructive pulmonary disease) (HCC)    Diabetes mellitus without  complication (HCC)    type II    GERD (gastroesophageal reflux disease)    Hypercalcemia    Hyperlipidemia    Hypertension    Pneumonia    Renal disorder    Single kidney    Stroke [facility])    July 2021   Thrombosis    Tobacco abuse    Past Surgical History:  Procedure Laterality Date   ABDOMINAL HYSTERECTOMY     blood clots removed from descending aorta      BREAST BIOPSY Right 07/28/2021   BREAST BIOPSY Right 08/17/2021   BREAST LUMPECTOMY Right 01/25/2021   BREAST LUMPECTOMY WITH RADIOACTIVE SEED LOCALIZATION Right 01/25/2021   Procedure: RIGHT BREAST LUMPECTOMY WITH RADIOACTIVE SEED LOCALIZATION;  Surgeon: Abigail Miyamoto, MD;  Location: [facility];  Service: General;  Laterality: Right;   CHOLECYSTECTOMY     COLONOSCOPY     COLONOSCOPY WITH PROPOFOL N/A 04/17/2017   Procedure: COLONOSCOPY WITH PROPOFOL;  Surgeon: Charlott Rakes, MD;  Location: [facility];  Service: Endoscopy;  Laterality: N/A;   ESOPHAGOGASTRODUODENOSCOPY (EGD) WITH PROPOFOL N/A 04/17/2017   Procedure: ESOPHAGOGASTRODUODENOSCOPY (EGD) WITH PROPOFOL;  Surgeon: Charlott Rakes, MD;  Location: [facility];  Service: Endoscopy;  Laterality: N/A;   IR 3D INDEPENDENT WKST  05/04/2020   IR ANGIO INTRA EXTRACRAN SEL COM CAROTID INNOMINATE BILAT MOD SED  02/06/2020   IR ANGIO INTRA EXTRACRAN SEL INTERNAL CAROTID UNI L MOD SED  05/04/2020   IR ANGIO VERTEBRAL SEL VERTEBRAL BILAT MOD SED  02/06/2020   IR ANGIOGRAM FOLLOW  UP STUDY  05/04/2020   IR IMAGING GUIDED PORT INSERTION  05/01/2019   IR NEURO EACH ADD'L AFTER BASIC UNI LEFT (MS)  05/04/2020   IR RADIOLOGIST EVAL & MGMT  05/28/2020   IR TRANSCATH/EMBOLIZ  05/04/2020   IR US GUIDE VASC ACCESS RIGHT  02/06/2020   IR US GUIDE VASC ACCESS RIGHT  05/04/2020   LESION REMOVAL Left 01/25/2021   Procedure: ABNORMAL SKIN LESION REMOVAL LEFT ABDOMINAL WALL;  Surgeon: Abigail Miyamoto, MD;  Location: [facility];  Service: General;   Laterality: Left;   NEPHRECTOMY RECIPIENT     ORIF ANKLE FRACTURE N/A 07/13/2021   Procedure: OPEN REDUCTION INTERNAL FIXATION (ORIF) ANKLE FRACTURE;  Surgeon: Ernestina Columbia, MD;  Location: [facility];  Service: Orthopedics;  Laterality: N/A;   RADIOLOGY WITH ANESTHESIA N/A 05/04/2020   Procedure: Vertis Kelch;  Surgeon: Julieanne Cotton, MD;  Location: [facility];  Service: Radiology;  Laterality: N/A;   Social History:  reports that she has been smoking cigarettes. She has a 10.00 pack-year smoking history. She has never used smokeless tobacco. She reports current alcohol use. She reports that she does not use drugs.  Allergies  Allergen Reactions   Contrast Media [Iodinated Contrast Media]     Renal hypertension per physician   Other Other (See Comments)    No ASA/Anti-Coag due to GI bleed   Family History  Problem Relation Age of Onset   Hypertension Mother    Diabetes Father    Breast cancer Sister    Breast cancer Maternal Aunt        8s   Breast cancer Maternal Aunt 69       colon cancer   Hypertension Brother      Prior to Admission medications   Medication Sig Start Date End Date Taking? Authorizing Provider  allopurinol (ZYLOPRIM) 100 MG tablet Take 100 mg by mouth at bedtime.    [provider]  amLODipine (NORVASC) 10 MG tablet Take 10 mg by mouth daily.    [provider]  aspirin EC 81 MG EC tablet Take 1 tablet (81 mg total) by mouth daily. Swallow whole. 11/22/19   Danford, Earl Lites, MD  atorvastatin (LIPITOR) 40 MG tablet Take 40 mg by mouth daily. 07/06/21   [provider]  baclofen (LIORESAL) 10 MG tablet Take 20 mg by mouth 3 (three) times daily as needed for muscle spasms.    [provider]  Dulaglutide (TRULICITY) 1.5 MG/0.5ML SOPN Inject 1.5 mg into the skin every Monday.     [provider]  ezetimibe (ZETIA) 10 MG tablet Take 10 mg by mouth daily.    [provider]  famotidine (PEPCID) 20 MG tablet  Take 20 mg by mouth 2 (two) times daily. 07/06/21   [provider]  feeding supplement, ENSURE ENLIVE, (ENSURE ENLIVE) LIQD Take 237 mLs by mouth 2 (two) times daily between meals. 03/30/19   Barnetta Chapel, MD  furosemide (LASIX) 40 MG tablet Take 40 mg by mouth daily as needed for fluid or edema. 04/08/20   [provider]  gabapentin (NEURONTIN) 100 MG capsule Take 2 capsules (200 mg total) by mouth 4 (four) times daily. 05/11/20   Louk, Waylan Boga, PA-C  insulin glargine, 1 Unit Dial, (TOUJEO SOLOSTAR) 300 UNIT/ML Solostar Pen Inject 20 Units into the skin at bedtime.     [provider]  insulin lispro (HUMALOG) 100 UNIT/ML KwikPen Inject 5 Units into the skin 3 (three) times daily as needed (blood  sugar >200). Patient not taking: Reported on 03/09/2022 11/09/20   [provider]  Ipratropium-Albuterol (COMBIVENT) 20-100 MCG/ACT AERS respimat Inhale 1 puff into the lungs every 6 (six) hours as needed for wheezing or shortness of breath. 03/17/17   Hongalgi, Maximino Greenland, MD  levothyroxine (SYNTHROID) 75 MCG tablet Take 75 mcg by mouth daily. 02/28/19   [provider]  lidocaine-prilocaine (EMLA) cream APPLY 2-5 cms of cream TO port site 45 MINUTES - ONE HOUR prior TO access 04/01/22   Malachy Mood, MD  losartan (COZAAR) 25 MG tablet Take 1 tablet (25 mg total) by mouth daily. 11/22/19 08/21/21  Danford, Earl Lites, MD  metoprolol (LOPRESSOR) 50 MG tablet Take 50 mg by mouth 2 (two) times daily.  03/06/14   [provider]  NARCAN 4 MG/0.1ML LIQD nasal spray kit Place 1 spray into the nose as needed for opioid reversal. 08/22/19   [provider]  Omega-3 Fatty Acids (FISH OIL PO) Take 1 capsule by mouth 2 (two) times daily.    [provider]  oxyCODONE ER (XTAMPZA ER) 27 MG C12A     [provider]  oxyCODONE-acetaminophen (PERCOCET) 10-325 MG tablet Take 1 tablet by mouth every 6 (six) hours as needed. 02/18/22    [provider]  pantoprazole (PROTONIX) 40 MG tablet Take 40 mg by mouth every morning.    [provider]  polyethylene glycol powder (MIRALAX) 17 GM/SCOOP powder Take 17 g by mouth 2 (two) times daily as needed for moderate constipation. 07/16/21   Almon Hercules, MD  senna-docusate (SENOKOT-S) 8.6-50 MG tablet Take 1 tablet by mouth 2 (two) times daily between meals as needed for mild constipation. 07/16/21   Almon Hercules, MD  Tetrahydrozoline HCl (VISINE OP) Place 1 drop into both eyes 3 (three) times daily as needed (dry eyes).    [provider]    Physical Exam: BP (!) 110/50   Pulse (!) 52   Temp 98.1 F (36.7 C) (Rectal)   Resp 11   SpO2 99%   General: Somnolent, difficult to arouse.  Tells me her name but not much  else.  Following commands, but very intermittently. Eyes: EOMI, clear conjuctivae, white sclerea Neck: supple, no masses, trachea mildline  Cardiovascular: RRR, no murmurs or rubs, no peripheral edema  Respiratory: clear to auscultation bilaterally, no wheezes, no crackles  Abdomen: soft, nontender, nondistended, normal bowel tones heard  Skin: dry, no rashes  Musculoskeletal: no joint effusions, normal range of motion           Labs on Admission:  Basic Metabolic Panel: Recent Labs  Lab 10/08/22 1413 10/08/22 1426  NA 134* 133*  K 5.0 5.0  CL 99 101  CO2 24  --   GLUCOSE 384* 376*  BUN 37* 34*  CREATININE 2.30* 2.30*  CALCIUM 10.1  --    Liver Function Tests: Recent Labs  Lab 10/08/22 1413  AST 25  ALT 25  ALKPHOS 166*  BILITOT 0.5  PROT 7.6  ALBUMIN 4.0   No results for input(s): "LIPASE", "AMYLASE" in the last 168 hours. Recent Labs  Lab 10/08/22 1414  AMMONIA 16   CBC: Recent Labs  Lab 10/08/22 1413 10/08/22 1426  WBC 11.2*  --   NEUTROABS 9.5*  --   HGB 9.1* 9.9*  HCT 31.3* 29.0*  MCV 77.3*  --   PLT 297  --    Cardiac Enzymes: No results for input(s): "CKTOTAL", "CKMB", "CKMBINDEX", "TROPONINI"  in the last  168 hours.  BNP (last 3 results) No results for input(s): "BNP" in the last 8760 hours.  ProBNP (last 3 results) No results for input(s): "PROBNP" in the last 8760 hours.  CBG: Recent Labs  Lab 10/08/22 1357  GLUCAP 368*    Radiological Exams on Admission: DG Chest Port 1 View  Result Date: 10/08/2022 CLINICAL DATA:  Altered mental status. EXAM: PORTABLE CHEST 1 VIEW COMPARISON:  May 03, 2022 FINDINGS: Injectable port terminates at the expected location of the cavoatrial junction. Calcific atherosclerotic disease of the aorta. Mildly enlarged cardiac silhouette. Mediastinal contours appear intact. There is no evidence of focal airspace consolidation, pleural effusion or pneumothorax. Osseous structures are without acute abnormality. Soft tissues are grossly normal. IMPRESSION: 1. Mildly enlarged cardiac silhouette. 2. No active disease. Electronically Signed   By: Ted Mcalpine M.D.   On: 10/08/2022 15:12   CT Head Wo Contrast  Result Date: 10/08/2022 CLINICAL DATA:  Altered mental status. EXAM: CT HEAD WITHOUT CONTRAST TECHNIQUE: Contiguous axial images were obtained from the base of the skull through the vertex without intravenous contrast. RADIATION DOSE REDUCTION: This exam was performed according to the departmental dose-optimization program which includes automated exposure control, adjustment of the mA and/or kV according to patient size and/or use of iterative reconstruction technique. COMPARISON:  MRI brain dated June 02, 2021. CT head dated May 04, 2020. FINDINGS: Brain: No evidence of acute infarction, hemorrhage, hydrocephalus, extra-axial collection or mass lesion/mass effect. Old infarct in the posterior left temporal lobe again noted. Vascular: Atherosclerotic vascular calcification of the carotid siphons. No hyperdense vessel. Left MCA aneurysm embolization again noted. Skull: Normal. Negative for fracture or focal lesion. Sinuses/Orbits: No acute  finding. Other: None. IMPRESSION: 1. No acute intracranial abnormality. 2. Old infarct in the posterior left temporal lobe. Electronically Signed   By: Obie Dredge M.D.   On: 10/08/2022 15:12    Assessment/Plan 51 old female with history of hyperlipidemia, hypothyroidism, insulin-dependent type 2 diabetes, CKD, chronic narcotics admitted to the hospital with altered mental status in the setting of AKI.  Suspected  metabolic encephalopathy-with unintentional narcotic overdose, in the setting of continued long-acting narcotics on a background of acute on chronic renal failure.  Workup for stroke, infection, metabolic derangement negative so far. -Observation admission -Check TSH -Hold long-acting narcotics    AKI (acute kidney injury) (HCC) superimposed on CKD 3-renal function increased from baseline, which could be potentiating narcotic effect -Hold nephrotoxins -Hydrate gently -Check renal function with morning labs     Diabetes mellitus type 2 with neurological manifestations (HCC) -Diabetic diet when able to eat -Sliding scale insulin    Hyperlipemia, mixed    Hypothyroidism-check continue home Synthroid, check TSH    GERD (gastroesophageal reflux disease)-continue famotidine twice daily    Chronic diastolic CHF (congestive heart failure) (HCC)-currently no evidence of acute exacerbation, continue appropriate home medications  ADDENDUM: Patient vomited while still in the ER, luckily staff was present and provided suction immediately.  Concerned that she is not protecting her airway, discussed with critical care Dr. Judeth Horn who will admit to ICU, starting Narcan drip and may need intubation.  DVT prophylaxis: Lovenox     Code Status: Full Code  Consults called: None  Admission status: Observation  Time spent: 36 minutes  Deniese Oberry Sharlette Dense MD [facility] Pager [phone]  If 7PM-7AM, please contact night-coverage [email or web address] Password [facility]  10/08/2022,  4:25 PM

## 2022-10-09 DIAGNOSIS — N179 Acute kidney failure, unspecified: Secondary | ICD-10-CM | POA: Diagnosis not present

## 2022-10-09 LAB — GLUCOSE, CAPILLARY
Glucose-Capillary: 123 mg/dL — ABNORMAL HIGH (ref 70–99)
Glucose-Capillary: 131 mg/dL — ABNORMAL HIGH (ref 70–99)
Glucose-Capillary: 136 mg/dL — ABNORMAL HIGH (ref 70–99)
Glucose-Capillary: 139 mg/dL — ABNORMAL HIGH (ref 70–99)
Glucose-Capillary: 148 mg/dL — ABNORMAL HIGH (ref 70–99)
Glucose-Capillary: 154 mg/dL — ABNORMAL HIGH (ref 70–99)
Glucose-Capillary: 165 mg/dL — ABNORMAL HIGH (ref 70–99)
Glucose-Capillary: 194 mg/dL — ABNORMAL HIGH (ref 70–99)
Glucose-Capillary: 230 mg/dL — ABNORMAL HIGH (ref 70–99)
Glucose-Capillary: 235 mg/dL — ABNORMAL HIGH (ref 70–99)
Glucose-Capillary: 258 mg/dL — ABNORMAL HIGH (ref 70–99)

## 2022-10-09 LAB — BASIC METABOLIC PANEL
Anion gap: 10 (ref 5–15)
BUN: 21 mg/dL (ref 8–23)
CO2: 23 mmol/L (ref 22–32)
Calcium: 9.8 mg/dL (ref 8.9–10.3)
Chloride: 108 mmol/L (ref 98–111)
Creatinine, Ser: 1.22 mg/dL — ABNORMAL HIGH (ref 0.44–1.00)
GFR, Estimated: 47 mL/min — ABNORMAL LOW (ref >60)
Glucose, Bld: 117 mg/dL — ABNORMAL HIGH (ref 70–99)
Potassium: 3.9 mmol/L (ref 3.5–5.1)
Sodium: 141 mmol/L (ref 135–145)

## 2022-10-09 LAB — CBC
HCT: 28.8 % — ABNORMAL LOW (ref 36.0–46.0)
Hemoglobin: 8.2 g/dL — ABNORMAL LOW (ref 12.0–15.0)
MCH: 22.5 pg — ABNORMAL LOW (ref 26.0–34.0)
MCHC: 28.5 g/dL — ABNORMAL LOW (ref 30.0–36.0)
MCV: 78.9 fL — ABNORMAL LOW (ref 80.0–100.0)
Platelets: 260 10*3/uL (ref 150–400)
RBC: 3.65 MIL/uL — ABNORMAL LOW (ref 3.87–5.11)
RDW: 16.9 % — ABNORMAL HIGH (ref 11.5–15.5)
WBC: 11.6 10*3/uL — ABNORMAL HIGH (ref 4.0–10.5)
nRBC: 0 % (ref 0.0–0.2)

## 2022-10-09 MED ORDER — INSULIN DETEMIR 100 UNIT/ML ~~LOC~~ SOLN
8.0000 [IU] | Freq: Two times a day (BID) | SUBCUTANEOUS | Status: DC
  Filled 2022-10-09: qty 0.08

## 2022-10-09 MED ORDER — HYDRALAZINE HCL 20 MG/ML IJ SOLN
5.0000 mg | INTRAMUSCULAR | Status: DC | PRN
  Administered 2022-10-09: 5 mg via INTRAVENOUS
  Filled 2022-10-09: qty 1

## 2022-10-09 MED ORDER — INSULIN DETEMIR 100 UNIT/ML ~~LOC~~ SOLN
8.0000 [IU] | Freq: Two times a day (BID) | SUBCUTANEOUS | Status: DC
  Administered 2022-10-09: 8 [IU] via SUBCUTANEOUS
  Filled 2022-10-09 (×2): qty 0.08

## 2022-10-09 MED ORDER — INSULIN ASPART 100 UNIT/ML IJ SOLN
0.0000 [IU] | Freq: Three times a day (TID) | INTRAMUSCULAR | Status: DC
  Administered 2022-10-09: 8 [IU] via SUBCUTANEOUS
  Administered 2022-10-10: 5 [IU] via SUBCUTANEOUS
  Administered 2022-10-10: 8 [IU] via SUBCUTANEOUS
  Administered 2022-10-10 – 2022-10-11 (×2): 5 [IU] via SUBCUTANEOUS
  Administered 2022-10-11: 11 [IU] via SUBCUTANEOUS
  Administered 2022-10-11: 8 [IU] via SUBCUTANEOUS
  Administered 2022-10-12: 11 [IU] via SUBCUTANEOUS
  Administered 2022-10-12: 5 [IU] via SUBCUTANEOUS

## 2022-10-09 MED ORDER — INSULIN ASPART 100 UNIT/ML IJ SOLN
2.0000 [IU] | INTRAMUSCULAR | Status: DC
  Administered 2022-10-09: 4 [IU] via SUBCUTANEOUS
  Administered 2022-10-09: 2 [IU] via SUBCUTANEOUS

## 2022-10-09 MED ORDER — ORAL CARE MOUTH RINSE: 15.0000 mL | OROMUCOSAL | Status: DC | PRN

## 2022-10-09 NOTE — Progress Notes (Signed)
  Transition of Care [facility]) Screening Note   Patient Details  Name: Bethany [name] Date of Birth: 1951-05-07   Transition of Care [facility]) CM/SW Contact:    Adrian Prows, RN Phone Number: 10/09/2022, 3:40 PM    Transition of Care Department [facility]) has reviewed patient and no TOC needs have been identified at this time. We will continue to monitor patient advancement through interdisciplinary progression rounds. If new patient transition needs arise, please place a TOC consult.

## 2022-10-09 NOTE — Progress Notes (Signed)
NAME:  TASHIYA KRUPA, MRN:  [id number], DOB:  1951-01-27, LOS: 1 ADMISSION DATE:  10/08/2022, CONSULTATION DATE:  10/09/22  REFERRING MD:  TRH, CHIEF COMPLAINT:  encephalopathy   History of Present Illness:  72 year old woman admitted to the hospital with encephalopathy improved after Narcan with subsequent return of encephalopathy, nausea vomiting, hyperglycemia, acute renal failure.  Unable to obtain history from patient due to encephalopathy.  No other family at bedside.  Per chart review and family patient with blacking out spells, syncope for the last several weeks.  She was noted to be progressively somnolent throughout the course of the day.  Then vomited.  Brought to the ER.  Not following commands.  Given doses of current head workup complains of being in pain.  Again some again subsequent Narcan again with pain, chest pain etc.  Exam with depression of mental status again.  Labs reveal acute kidney failure, AKI.  She endorses taking chronic pain pills.  When asked specific names of opiate she said yes, Norco, codeine etc.  It appears she was prescribed Percocet in the past.  Pertinent  Medical History  Insulin-dependent diabetes, hypertension, opioid dependence  Significant Hospital Events: Including procedures, antibiotic start and stop dates in addition to other pertinent events   5/2 5 admitted to the hospital with encephalopathy shorten improvement with normal, was recurrent encephalopathy, nausea vomiting, hyperglycemia, AKI  Interim History / Subjective:  More alert this morning.  Able to have a full conversation.  Still appears somewhat encephalopathic.  Blood glucose improved.  Kidney function, creatinine improved.  Good urine output.  Objective   Blood pressure (!) 151/40, pulse 68, temperature 98.5 F (36.9 C), temperature source Axillary, resp. rate 19, height 5\' 5"  (1.651 m), weight 71.4 kg, SpO2 99 %.        Intake/Output Summary (Last 24 hours) at 10/09/2022  1056 Last data filed at 10/09/2022 0800 Gross per 24 hour  Intake 1760.67 ml  Output 2300 ml  Net -539.33 ml   Filed Weights   10/08/22 2030  Weight: 71.4 kg    Examination: General: Chronically ill appearing, lying in bed r HENT: EOMI, atraumatic Lungs: Distant but clear throughout Cardiovascular: Tachycardic, warm Abdomen: Nondistended bowel sounds present Neuro: Encephalopathic, improving prior, hold conversation, follows commands, remains on Narcan drip   Resolved Hospital Problem list     Assessment & Plan:  Encephalopathy: Presumed toxic encephalopathy accumulation of opiate narcotics given improvement although short-lived with Narcan.  Also with renal failure as a possible contributing factor.  Hyperglycemia as well.  Improving with Narcan drip and improvement in hyperglycemia. -- Narcan drip, decrease rate and wean as able -- Minimize centrally acting medications  Aspiration: With vomiting in the ED. -- Ceftriaxone plan 5 days  Hyperglycemia: Baseline diabetes. -- IV insulin discontinued, transition to subcu insulin in a.m. 5/26  Nausea/vomiting: On Trulicity, possible gastroparesis insulin for diabetes. Also likely related to hyperglycemia -- Hyperglycemia control as above -- IV antiemetics  AKI: Hypovolemia in the setting of hyperglycemia. -- Hold losartan -- Improving with IV fluids    Best Practice (right click and "Reselect all SmartList Selections" daily)   Diet/type: Regular consistency (see orders) DVT prophylaxis: prophylactic heparin  GI prophylaxis: N/A Lines: N/A Foley:  N/A Code Status:  full code Last date of multidisciplinary goals of care discussion [unable to discuss with family]  Labs   CBC: Recent Labs  Lab 10/08/22 1413 10/08/22 1426 10/09/22 0315  WBC 11.2*  --  11.6*  NEUTROABS 9.5*  --   --  HGB 9.1* 9.9* 8.2*  HCT 31.3* 29.0* 28.8*  MCV 77.3*  --  78.9*  PLT 297  --  260     Basic Metabolic Panel: Recent Labs   Lab 10/08/22 1413 10/08/22 1426 10/09/22 0315  NA 134* 133* 141  K 5.0 5.0 3.9  CL 99 101 108  CO2 24  --  23  GLUCOSE 384* 376* 117*  BUN 37* 34* 21  CREATININE 2.30* 2.30* 1.22*  CALCIUM 10.1  --  9.8    GFR: Estimated Creatinine Clearance: 41.3 mL/min (A) (by C-G formula based on SCr of 1.22 mg/dL (H)). Recent Labs  Lab 10/08/22 1413 10/08/22 1414 10/09/22 0315  WBC 11.2*  --  11.6*  LATICACIDVEN  --  1.4  --      Liver Function Tests: Recent Labs  Lab 10/08/22 1413  AST 25  ALT 25  ALKPHOS 166*  BILITOT 0.5  PROT 7.6  ALBUMIN 4.0    No results for input(s): "LIPASE", "AMYLASE" in the last 168 hours. Recent Labs  Lab 10/08/22 1414  AMMONIA 16     ABG    Component Value Date/Time   PHART 7.36 10/08/2022 1418   PCO2ART 48 10/08/2022 1418   PO2ART 204 (H) 10/08/2022 1418   HCO3 27.1 10/08/2022 1418   TCO2 26 10/08/2022 1426   O2SAT 100 10/08/2022 1418     Coagulation Profile: Recent Labs  Lab 10/08/22 1413  INR 0.9     Cardiac Enzymes: No results for input(s): "CKTOTAL", "CKMB", "CKMBINDEX", "TROPONINI" in the last 168 hours.  HbA1C: Hgb A1c MFr Bld  Date/Time Value Ref Range Status  07/12/2021 05:03 AM 5.2 4.8 - 5.6 % Final    Comment:    (NOTE) Pre diabetes:          5.7%-6.4%  Diabetes:              >6.4%  Glycemic control for   <7.0% adults with diabetes   05/04/2020 04:58 PM 4.7 (L) 4.8 - 5.6 % Final    Comment:    (NOTE) Pre diabetes:          5.7%-6.4%  Diabetes:              >6.4%  Glycemic control for   <7.0% adults with diabetes     CBG: Recent Labs  Lab 10/09/22 0335 10/09/22 0445 10/09/22 0545 10/09/22 0645 10/09/22 0750  GLUCAP 123* 136* 154* 139* 131*     Review of Systems:   Unobtainable to obtain due to patient factors  Past Medical History:  She,  has a past medical history of Acute renal failure (HCC), Anemia, Arthritis, COPD (chronic obstructive pulmonary disease) (HCC), Diabetes mellitus  without complication (HCC), GERD (gastroesophageal reflux disease), Hypercalcemia, Hyperlipidemia, Hypertension, Pneumonia, Renal disorder, Single kidney, Stroke (HCC), Thrombosis, and Tobacco abuse.   Surgical History:   Past Surgical History:  Procedure Laterality Date   ABDOMINAL HYSTERECTOMY     blood clots removed from descending aorta      BREAST BIOPSY Right 07/28/2021   BREAST BIOPSY Right 08/17/2021   BREAST LUMPECTOMY Right 01/25/2021   BREAST LUMPECTOMY WITH RADIOACTIVE SEED LOCALIZATION Right 01/25/2021   Procedure: RIGHT BREAST LUMPECTOMY WITH RADIOACTIVE SEED LOCALIZATION;  Surgeon: Abigail Miyamoto, MD;  Location: [facility];  Service: General;  Laterality: Right;   CHOLECYSTECTOMY     COLONOSCOPY     COLONOSCOPY WITH PROPOFOL N/A 04/17/2017   Procedure: COLONOSCOPY WITH PROPOFOL;  Surgeon: Charlott Rakes, MD;  Location: [facility];  Service: Endoscopy;  Laterality: N/A;   ESOPHAGOGASTRODUODENOSCOPY (EGD) WITH PROPOFOL N/A 04/17/2017   Procedure: ESOPHAGOGASTRODUODENOSCOPY (EGD) WITH PROPOFOL;  Surgeon: Charlott Rakes, MD;  Location: [facility];  Service: Endoscopy;  Laterality: N/A;   IR 3D INDEPENDENT WKST  05/04/2020   IR ANGIO INTRA EXTRACRAN SEL COM CAROTID INNOMINATE BILAT MOD SED  02/06/2020   IR ANGIO INTRA EXTRACRAN SEL INTERNAL CAROTID UNI L MOD SED  05/04/2020   IR ANGIO VERTEBRAL SEL VERTEBRAL BILAT MOD SED  02/06/2020   IR ANGIOGRAM FOLLOW UP STUDY  05/04/2020   IR IMAGING GUIDED PORT INSERTION  05/01/2019   IR NEURO EACH ADD'L AFTER BASIC UNI LEFT (MS)  05/04/2020   IR RADIOLOGIST EVAL & MGMT  05/28/2020   IR TRANSCATH/EMBOLIZ  05/04/2020   IR US GUIDE VASC ACCESS RIGHT  02/06/2020   IR US GUIDE VASC ACCESS RIGHT  05/04/2020   LESION REMOVAL Left 01/25/2021   Procedure: ABNORMAL SKIN LESION REMOVAL LEFT ABDOMINAL WALL;  Surgeon: Abigail Miyamoto, MD;  Location: [facility];  Service: General;  Laterality: Left;    NEPHRECTOMY RECIPIENT     ORIF ANKLE FRACTURE N/A 07/13/2021   Procedure: OPEN REDUCTION INTERNAL FIXATION (ORIF) ANKLE FRACTURE;  Surgeon: Ernestina Columbia, MD;  Location: [facility];  Service: Orthopedics;  Laterality: N/A;   RADIOLOGY WITH ANESTHESIA N/A 05/04/2020   Procedure: Vertis Kelch;  Surgeon: Julieanne Cotton, MD;  Location: [facility];  Service: Radiology;  Laterality: N/A;     Social History:   reports that she has been smoking cigarettes. She has a 10.00 pack-year smoking history. She has never used smokeless tobacco. She reports current alcohol use. She reports that she does not use drugs.   Family History:  Her family history includes Breast cancer in her maternal aunt and sister; Breast cancer (age of onset: 48) in her maternal aunt; Diabetes in her father; Hypertension in her brother and mother.   Allergies Allergies  Allergen Reactions   Contrast Media [Iodinated Contrast Media]     Renal hypertension per physician   Other Other (See Comments)    No ASA/Anti-Coag due to GI bleed     Home Medications  Prior to Admission medications   Medication Sig Start Date End Date Taking? Authorizing Provider  allopurinol (ZYLOPRIM) 100 MG tablet Take 100 mg by mouth at bedtime.   Yes [provider]  amLODipine (NORVASC) 10 MG tablet Take 10 mg by mouth daily.   Yes [provider]  aspirin EC 81 MG EC tablet Take 1 tablet (81 mg total) by mouth daily. Swallow whole. 11/22/19  Yes Danford, Earl Lites, MD  atorvastatin (LIPITOR) 40 MG tablet Take 40 mg by mouth daily. 07/06/21  Yes [provider]  baclofen (LIORESAL) 10 MG tablet Take 20 mg by mouth 3 (three) times daily as needed for muscle spasms.   Yes [provider]  Dulaglutide (TRULICITY) 1.5 MG/0.5ML SOPN Inject 1.5 mg into the skin every Monday.    Yes [provider]  ezetimibe (ZETIA) 10 MG tablet Take 10 mg by mouth daily.   Yes [provider]  famotidine (PEPCID) 20 MG  tablet Take 20 mg by mouth 2 (two) times daily. 07/06/21  Yes [provider]  feeding supplement, ENSURE ENLIVE, (ENSURE ENLIVE) LIQD Take 237 mLs by mouth 2 (two) times daily between meals. 03/30/19  Yes Berton Mount I, MD  furosemide (LASIX) 40 MG tablet Take 40 mg by mouth daily as needed for fluid or edema. 04/08/20  Yes [provider]  gabapentin (NEURONTIN) 100 MG capsule Take 2 capsules (200 mg total) by mouth 4 (four) times daily. 05/11/20  Yes Louk, Alexandra M, PA-C  insulin glargine, 1 Unit Dial, (TOUJEO SOLOSTAR) 300 UNIT/ML Solostar Pen Inject 20 Units into the skin at bedtime.    Yes [provider]  insulin lispro (HUMALOG) 100 UNIT/ML KwikPen Inject 5 Units into the skin 3 (three) times daily as needed (blood sugar >200). 11/09/20  Yes [provider]  Ipratropium-Albuterol (COMBIVENT) 20-100 MCG/ACT AERS respimat Inhale 1 puff into the lungs every 6 (six) hours as needed for wheezing or shortness of breath. 03/17/17  Yes Hongalgi, Maximino Greenland, MD  levothyroxine (SYNTHROID) 75 MCG tablet Take 75 mcg by mouth daily. 02/28/19  Yes [provider]  lidocaine-prilocaine (EMLA) cream APPLY 2-5 cms of cream TO port site 45 MINUTES - ONE HOUR prior TO access 04/01/22  Yes Malachy Mood, MD  losartan (COZAAR) 25 MG tablet Take 1 tablet (25 mg total) by mouth daily. 11/22/19 10/08/22 Yes Danford, Earl Lites, MD  metoprolol (LOPRESSOR) 50 MG tablet Take 50 mg by mouth 2 (two) times daily.  03/06/14  Yes [provider]  NARCAN 4 MG/0.1ML LIQD nasal spray kit Place 1 spray into the nose as needed for opioid reversal. 08/22/19  Yes [provider]  Omega-3 Fatty Acids (FISH OIL PO) Take 1 capsule by mouth 2 (two) times daily.   Yes [provider]  oxyCODONE-acetaminophen (PERCOCET) 10-325 MG tablet Take 1 tablet by mouth every 6 (six) hours as needed for pain. 02/18/22  Yes [provider]  pantoprazole (PROTONIX) 40 MG  tablet Take 40 mg by mouth every morning.   Yes [provider]  polyethylene glycol powder (MIRALAX) 17 GM/SCOOP powder Take 17 g by mouth 2 (two) times daily as needed for moderate constipation. 07/16/21  Yes Almon Hercules, MD  senna-docusate (SENOKOT-S) 8.6-50 MG tablet Take 1 tablet by mouth 2 (two) times daily between meals as needed for mild constipation. 07/16/21  Yes Almon Hercules, MD  Tetrahydrozoline HCl (VISINE OP) Place 1 drop into both eyes 3 (three) times daily as needed (dry eyes).   Yes [provider]     Critical care time:    CRITICAL CARE Performed by: Karren Burly   Total critical care time: 31 minutes  Critical care time was exclusive of separately billable procedures and treating other patients.  Critical care was necessary to treat or prevent imminent or life-threatening deterioration.  Critical care was time spent personally by me on the following activities: development of treatment plan with patient and/or surrogate as well as nursing, discussions with consultants, evaluation of patient's response to treatment, examination of patient, obtaining history from patient or surrogate, ordering and performing treatments and interventions, ordering and review of laboratory studies, ordering and review of radiographic studies, pulse oximetry and re-evaluation of patient's condition.

## 2022-10-09 NOTE — Progress Notes (Signed)
10/09/2022 Sequential compression devices applied as ordered, however, patient unable to tolerate due to neuropathic pain in legs bilaterally. Cindy S. Clelia Croft BSN, RN, CCRP 10/09/2022 5:56 AM

## 2022-10-10 DIAGNOSIS — N179 Acute kidney failure, unspecified: Secondary | ICD-10-CM | POA: Diagnosis not present

## 2022-10-10 LAB — COMPREHENSIVE METABOLIC PANEL
ALT: 19 U/L (ref 0–44)
AST: 17 U/L (ref 15–41)
Albumin: 3.2 g/dL — ABNORMAL LOW (ref 3.5–5.0)
Alkaline Phosphatase: 122 U/L (ref 38–126)
Anion gap: 5 (ref 5–15)
BUN: 19 mg/dL (ref 8–23)
CO2: 26 mmol/L (ref 22–32)
Calcium: 9.7 mg/dL (ref 8.9–10.3)
Chloride: 107 mmol/L (ref 98–111)
Creatinine, Ser: 1.07 mg/dL — ABNORMAL HIGH (ref 0.44–1.00)
GFR, Estimated: 55 mL/min — ABNORMAL LOW (ref >60)
Glucose, Bld: 208 mg/dL — ABNORMAL HIGH (ref 70–99)
Potassium: 3.7 mmol/L (ref 3.5–5.1)
Sodium: 138 mmol/L (ref 135–145)
Total Bilirubin: 0.4 mg/dL (ref 0.3–1.2)
Total Protein: 6.6 g/dL (ref 6.5–8.1)

## 2022-10-10 LAB — GLUCOSE, CAPILLARY
Glucose-Capillary: 216 mg/dL — ABNORMAL HIGH (ref 70–99)
Glucose-Capillary: 217 mg/dL — ABNORMAL HIGH (ref 70–99)
Glucose-Capillary: 262 mg/dL — ABNORMAL HIGH (ref 70–99)

## 2022-10-10 MED ORDER — GABAPENTIN 100 MG PO CAPS
200.0000 mg | ORAL_CAPSULE | Freq: Three times a day (TID) | ORAL | Status: DC
  Administered 2022-10-10 – 2022-10-12 (×7): 200 mg via ORAL
  Filled 2022-10-10 (×7): qty 2

## 2022-10-10 NOTE — Progress Notes (Signed)
NAME:  Bethany [name], MRN:  [id number], DOB:  10/01/1950, LOS: 2 ADMISSION DATE:  10/08/2022, CONSULTATION DATE:  10/10/22  REFERRING MD:  TRH, CHIEF COMPLAINT:  encephalopathy   History of Present Illness:  72 year old woman admitted to the hospital with encephalopathy improved after Narcan with subsequent return of encephalopathy, nausea vomiting, hyperglycemia, acute renal failure.  Unable to obtain history from patient due to encephalopathy.  No other family at bedside.  Per chart review and family patient with blacking out spells, syncope for the last several weeks.  She was noted to be progressively somnolent throughout the course of the day.  Then vomited.  Brought to the ER.  Not following commands.  Given doses of current head workup complains of being in pain.  Again some again subsequent Narcan again with pain, chest pain etc.  Exam with depression of mental status again.  Labs reveal acute kidney failure, AKI.  She endorses taking chronic pain pills.  When asked specific names of opiate she said yes, Norco, codeine etc.  It appears she was prescribed Percocet in the past.  Pertinent  Medical History  Insulin-dependent diabetes, hypertension, opioid dependence  Significant Hospital Events: Including procedures, antibiotic start and stop dates in addition to other pertinent events   5/2 5 admitted to the hospital with encephalopathy shorten improvement with normal, was recurrent encephalopathy, nausea vomiting, hyperglycemia, AKI 5/26 weaned off of Narcan drip   Interim History / Subjective:  Seems at baseline mental status.  Some hyperglycemia.  Lanes of pain and restless leg.  Objective   Blood pressure (!) 139/36, pulse 66, temperature 98.8 F (37.1 C), temperature source Oral, resp. rate 20, height 5\' 5"  (1.651 m), weight 71.4 kg, SpO2 100 %.        Intake/Output Summary (Last 24 hours) at 10/10/2022 1233 Last data filed at 10/10/2022 0150 Gross per 24 hour  Intake 337.77  ml  Output 350 ml  Net -12.23 ml    Filed Weights   10/08/22 2030  Weight: 71.4 kg    Examination: General: Chronically ill appearing, lying in bed  HENT: EOMI, atraumatic Lungs: Distant but clear throughout Cardiovascular: Tachycardic, warm Abdomen: Nondistended bowel sounds present Neuro: Alert and oriented, no focal deficits   Resolved Hospital Problem list     Assessment & Plan:  Encephalopathy (resolved): Presumed toxic encephalopathy accumulation of opiate narcotics given improvement although short-lived with Narcan.  Also with renal failure as a possible contributing factor.  Hyperglycemia as well.  Improving with Narcan drip and improvement in hyperglycemia. -- Narcan drip DC'd 5/26 -- Minimize centrally acting medications -- PT/OT pending  Aspiration: With vomiting in the ED. -- Ceftriaxone 5 days, consider transition to orals  Diabetes with hyperglycemia:  -- Continue subcutaneous insulin -- Would benefit from diabetes educator given she had hyperglycemia and dehydration leading to AKI  Nausea/vomiting: On Trulicity, possible gastroparesis from diabetes. Also likely related to hyperglycemia -- Hyperglycemia control as above -- IV antiemetics as needed  AKI resolved): Hypovolemia in the setting of hyperglycemia. -- Hold losartan  Chronic pain: Resume home gabapentin -- Needs medication reconciliation to confirm narcotic dose, would not resume at this time  Best Practice (right click and "Reselect all SmartList Selections" daily)   Diet/type: Regular consistency (see orders) DVT prophylaxis: prophylactic heparin  GI prophylaxis: N/A Lines: N/A Foley:  N/A Code Status:  full code Last date of multidisciplinary goals of care discussion [n/a]  Labs   CBC: Recent Labs  Lab 10/08/22 1413 10/08/22 1426  10/09/22 0315  WBC 11.2*  --  11.6*  NEUTROABS 9.5*  --   --   HGB 9.1* 9.9* 8.2*  HCT 31.3* 29.0* 28.8*  MCV 77.3*  --  78.9*  PLT 297  --  260      Basic Metabolic Panel: Recent Labs  Lab 10/08/22 1413 10/08/22 1426 10/09/22 0315 10/10/22 0911  NA 134* 133* 141 138  K 5.0 5.0 3.9 3.7  CL 99 101 108 107  CO2 24  --  23 26  GLUCOSE 384* 376* 117* 208*  BUN 37* 34* 21 19  CREATININE 2.30* 2.30* 1.22* 1.07*  CALCIUM 10.1  --  9.8 9.7    GFR: Estimated Creatinine Clearance: 47.1 mL/min (A) (by C-G formula based on SCr of 1.07 mg/dL (H)). Recent Labs  Lab 10/08/22 1413 10/08/22 1414 10/09/22 0315  WBC 11.2*  --  11.6*  LATICACIDVEN  --  1.4  --      Liver Function Tests: Recent Labs  Lab 10/08/22 1413 10/10/22 0911  AST 25 17  ALT 25 19  ALKPHOS 166* 122  BILITOT 0.5 0.4  PROT 7.6 6.6  ALBUMIN 4.0 3.2*    No results for input(s): "LIPASE", "AMYLASE" in the last 168 hours. Recent Labs  Lab 10/08/22 1414  AMMONIA 16     ABG    Component Value Date/Time   PHART 7.36 10/08/2022 1418   PCO2ART 48 10/08/2022 1418   PO2ART 204 (H) 10/08/2022 1418   HCO3 27.1 10/08/2022 1418   TCO2 26 10/08/2022 1426   O2SAT 100 10/08/2022 1418     Coagulation Profile: Recent Labs  Lab 10/08/22 1413  INR 0.9     Cardiac Enzymes: No results for input(s): "CKTOTAL", "CKMB", "CKMBINDEX", "TROPONINI" in the last 168 hours.  HbA1C: Hgb A1c MFr Bld  Date/Time Value Ref Range Status  07/12/2021 05:03 AM 5.2 4.8 - 5.6 % Final    Comment:    (NOTE) Pre diabetes:          5.7%-6.4%  Diabetes:              >6.4%  Glycemic control for   <7.0% adults with diabetes   05/04/2020 04:58 PM 4.7 (L) 4.8 - 5.6 % Final    Comment:    (NOTE) Pre diabetes:          5.7%-6.4%  Diabetes:              >6.4%  Glycemic control for   <7.0% adults with diabetes     CBG: Recent Labs  Lab 10/09/22 1143 10/09/22 1536 10/09/22 2155 10/10/22 0753 10/10/22 1129  GLUCAP 194* 258* 230* 216* 217*     Review of Systems:   Unobtainable to obtain due to patient factors  Past Medical History:  She,  has a past  medical history of Acute renal failure (HCC), Anemia, Arthritis, COPD (chronic obstructive pulmonary disease) (HCC), Diabetes mellitus without complication (HCC), GERD (gastroesophageal reflux disease), Hypercalcemia, Hyperlipidemia, Hypertension, Pneumonia, Renal disorder, Single kidney, Stroke (HCC), Thrombosis, and Tobacco abuse.   Surgical History:   Past Surgical History:  Procedure Laterality Date   ABDOMINAL HYSTERECTOMY     blood clots removed from descending aorta      BREAST BIOPSY Right 07/28/2021   BREAST BIOPSY Right 08/17/2021   BREAST LUMPECTOMY Right 01/25/2021   BREAST LUMPECTOMY WITH RADIOACTIVE SEED LOCALIZATION Right 01/25/2021   Procedure: RIGHT BREAST LUMPECTOMY WITH RADIOACTIVE SEED LOCALIZATION;  Surgeon: Abigail Miyamoto, MD;  Location: [facility];  Service: General;  Laterality: Right;   CHOLECYSTECTOMY     COLONOSCOPY     COLONOSCOPY WITH PROPOFOL N/A 04/17/2017   Procedure: COLONOSCOPY WITH PROPOFOL;  Surgeon: Charlott Rakes, MD;  Location: [facility];  Service: Endoscopy;  Laterality: N/A;   ESOPHAGOGASTRODUODENOSCOPY (EGD) WITH PROPOFOL N/A 04/17/2017   Procedure: ESOPHAGOGASTRODUODENOSCOPY (EGD) WITH PROPOFOL;  Surgeon: Charlott Rakes, MD;  Location: [facility];  Service: Endoscopy;  Laterality: N/A;   IR 3D INDEPENDENT WKST  05/04/2020   IR ANGIO INTRA EXTRACRAN SEL COM CAROTID INNOMINATE BILAT MOD SED  02/06/2020   IR ANGIO INTRA EXTRACRAN SEL INTERNAL CAROTID UNI L MOD SED  05/04/2020   IR ANGIO VERTEBRAL SEL VERTEBRAL BILAT MOD SED  02/06/2020   IR ANGIOGRAM FOLLOW UP STUDY  05/04/2020   IR IMAGING GUIDED PORT INSERTION  05/01/2019   IR NEURO EACH ADD'L AFTER BASIC UNI LEFT (MS)  05/04/2020   IR RADIOLOGIST EVAL & MGMT  05/28/2020   IR TRANSCATH/EMBOLIZ  05/04/2020   IR US GUIDE VASC ACCESS RIGHT  02/06/2020   IR US GUIDE VASC ACCESS RIGHT  05/04/2020   LESION REMOVAL Left 01/25/2021   Procedure: ABNORMAL SKIN LESION REMOVAL  LEFT ABDOMINAL WALL;  Surgeon: Abigail Miyamoto, MD;  Location: [facility];  Service: General;  Laterality: Left;   NEPHRECTOMY RECIPIENT     ORIF ANKLE FRACTURE N/A 07/13/2021   Procedure: OPEN REDUCTION INTERNAL FIXATION (ORIF) ANKLE FRACTURE;  Surgeon: Ernestina Columbia, MD;  Location: [facility];  Service: Orthopedics;  Laterality: N/A;   RADIOLOGY WITH ANESTHESIA N/A 05/04/2020   Procedure: Vertis Kelch;  Surgeon: Julieanne Cotton, MD;  Location: [facility];  Service: Radiology;  Laterality: N/A;     Social History:   reports that she has been smoking cigarettes. She has a 10.00 pack-year smoking history. She has never used smokeless tobacco. She reports current alcohol use. She reports that she does not use drugs.   Family History:  Her family history includes Breast cancer in her maternal aunt and sister; Breast cancer (age of onset: 49) in her maternal aunt; Diabetes in her father; Hypertension in her brother and mother.   Allergies Allergies  Allergen Reactions   Contrast Media [Iodinated Contrast Media]     Renal hypertension per physician   Other Other (See Comments)    No ASA/Anti-Coag due to GI bleed     Home Medications  Prior to Admission medications   Medication Sig Start Date End Date Taking? Authorizing Provider  allopurinol (ZYLOPRIM) 100 MG tablet Take 100 mg by mouth at bedtime.   Yes [provider]  amLODipine (NORVASC) 10 MG tablet Take 10 mg by mouth daily.   Yes [provider]  aspirin EC 81 MG EC tablet Take 1 tablet (81 mg total) by mouth daily. Swallow whole. 11/22/19  Yes Danford, Earl Lites, MD  atorvastatin (LIPITOR) 40 MG tablet Take 40 mg by mouth daily. 07/06/21  Yes [provider]  baclofen (LIORESAL) 10 MG tablet Take 20 mg by mouth 3 (three) times daily as needed for muscle spasms.   Yes [provider]  Dulaglutide (TRULICITY) 1.5 MG/0.5ML SOPN Inject 1.5 mg into the skin every Monday.    Yes [provider]  ezetimibe (ZETIA) 10 MG tablet Take 10 mg by mouth daily.   Yes [provider]  famotidine (PEPCID) 20 MG tablet Take 20 mg by mouth 2 (two) times daily. 07/06/21  Yes [provider]  feeding supplement, ENSURE ENLIVE, (ENSURE ENLIVE) LIQD Take  237 mLs by mouth 2 (two) times daily between meals. 03/30/19  Yes Berton Mount I, MD  furosemide (LASIX) 40 MG tablet Take 40 mg by mouth daily as needed for fluid or edema. 04/08/20  Yes [provider]  gabapentin (NEURONTIN) 100 MG capsule Take 2 capsules (200 mg total) by mouth 4 (four) times daily. 05/11/20  Yes Louk, Alexandra M, PA-C  insulin glargine, 1 Unit Dial, (TOUJEO SOLOSTAR) 300 UNIT/ML Solostar Pen Inject 20 Units into the skin at bedtime.    Yes [provider]  insulin lispro (HUMALOG) 100 UNIT/ML KwikPen Inject 5 Units into the skin 3 (three) times daily as needed (blood sugar >200). 11/09/20  Yes [provider]  Ipratropium-Albuterol (COMBIVENT) 20-100 MCG/ACT AERS respimat Inhale 1 puff into the lungs every 6 (six) hours as needed for wheezing or shortness of breath. 03/17/17  Yes Hongalgi, Maximino Greenland, MD  levothyroxine (SYNTHROID) 75 MCG tablet Take 75 mcg by mouth daily. 02/28/19  Yes [provider]  lidocaine-prilocaine (EMLA) cream APPLY 2-5 cms of cream TO port site 45 MINUTES - ONE HOUR prior TO access 04/01/22  Yes Malachy Mood, MD  losartan (COZAAR) 25 MG tablet Take 1 tablet (25 mg total) by mouth daily. 11/22/19 10/08/22 Yes Danford, Earl Lites, MD  metoprolol (LOPRESSOR) 50 MG tablet Take 50 mg by mouth 2 (two) times daily.  03/06/14  Yes [provider]  NARCAN 4 MG/0.1ML LIQD nasal spray kit Place 1 spray into the nose as needed for opioid reversal. 08/22/19  Yes [provider]  Omega-3 Fatty Acids (FISH OIL PO) Take 1 capsule by mouth 2 (two) times daily.   Yes [provider]  oxyCODONE-acetaminophen (PERCOCET) 10-325 MG tablet  Take 1 tablet by mouth every 6 (six) hours as needed for pain. 02/18/22  Yes [provider]  pantoprazole (PROTONIX) 40 MG tablet Take 40 mg by mouth every morning.   Yes [provider]  polyethylene glycol powder (MIRALAX) 17 GM/SCOOP powder Take 17 g by mouth 2 (two) times daily as needed for moderate constipation. 07/16/21  Yes Almon Hercules, MD  senna-docusate (SENOKOT-S) 8.6-50 MG tablet Take 1 tablet by mouth 2 (two) times daily between meals as needed for mild constipation. 07/16/21  Yes Almon Hercules, MD  Tetrahydrozoline HCl (VISINE OP) Place 1 drop into both eyes 3 (three) times daily as needed (dry eyes).   Yes [provider]     Critical care time: n/a    Karren Burly, MD

## 2022-10-10 NOTE — TOC Initial Note (Signed)
Transition of Care [facility]) - Initial/Assessment Note    Patient Details  Name: Bethany [name] MRN: [id number] Date of Birth: 10/22/1950  [facility] CM/SW Contact:    Howell Rucks, RN Phone Number: 10/10/2022, 11:26 AM  Clinical Narrative:    Await PT recommendation. TOC will continue to follow.                      Patient Goals and CMS Choice            Expected Discharge Plan and Services                                              Prior Living Arrangements/Services                       Activities of Daily Living Home Assistive Devices/Equipment: Blood pressure cuff, CBG Meter ADL Screening (condition at time of admission) Patient's cognitive ability adequate to safely complete daily activities?: Yes (under normal circumstances, according to daughter) Is the patient deaf or have difficulty hearing?: No Does the patient have difficulty seeing, even when wearing glasses/contacts?: No Does the patient have difficulty concentrating, remembering, or making decisions?: No Patient able to express need for assistance with ADLs?: Yes Does the patient have difficulty dressing or bathing?: No Independently performs ADLs?: Yes (appropriate for developmental age)  Permission Sought/Granted                  Emotional Assessment              Admission diagnosis:  Toxic encephalopathy [G92.9] Metabolic encephalopathy [G93.41] AKI (acute kidney injury) (HCC) [N17.9] Altered mental status, unspecified altered mental status type [R41.82] Patient Active Problem List   Diagnosis Date Noted   AKI (acute kidney injury) (HCC) 10/08/2022   Metabolic encephalopathy 10/08/2022   Toxic encephalopathy 10/08/2022   Chronic back pain 07/15/2021   Nondisplaced fracture of left patella 07/14/2021   Closed right ankle fracture, initial encounter 07/11/2021   Chronic rhinitis 06/22/2021   Tobacco use 04/30/2021   Multiple lung nodules  07/31/2020   Brain aneurysm 05/04/2020   Melena 12/30/2019   CVA (cerebral vascular accident) (HCC) 11/19/2019   Chronic diastolic CHF (congestive heart failure) (HCC)    Acute respiratory failure with hypoxia (HCC) 03/27/2019   Fever 03/27/2019   Shortness of breath 03/27/2019   Hypothyroidism 03/27/2019   Chronic GI bleeding 03/27/2019   Pleural effusion on right 03/27/2019   Acute renal failure superimposed on stage 3b chronic kidney disease (HCC) 03/27/2019   Essential hypertension 03/27/2019   GERD (gastroesophageal reflux disease) 03/27/2019   Acute respiratory failure (HCC) 03/27/2019   Breast mass, right 06/08/2017   Liver mass 06/08/2017   Chronic kidney disease (CKD), stage III (moderate) 03/13/2017   Chronic headaches 03/13/2017   Depression 03/13/2017   Diabetes mellitus type 2 with neurological manifestations (HCC) 03/13/2017   History of thrombosis 03/13/2017   Hyperlipemia, mixed 03/13/2017   Community acquired pneumonia 03/13/2017   COPD (chronic obstructive pulmonary disease) (HCC) 03/13/2017   Pneumonia 03/13/2017   Iron deficiency anemia secondary to blood loss (chronic) 03/07/2017   GI AVM (gastrointestinal arteriovenous vascular malformation) 03/07/2017   History of colonic polyps 10/05/2016   PCP:  [name], NP Pharmacy:   [id number] - [place], [place] - [address] [place] [address] Phone: [phone] Fax: [phone]  [facility] - [place], [place] - [address] [place] [address] Phone: [phone] Fax: [phone]     Social Determinants of Health (SDOH) Social History: SDOH Screenings   Tobacco Use: High Risk (08/31/2022)   SDOH Interventions:     Readmission Risk Interventions     No data to display

## 2022-10-11 DIAGNOSIS — N179 Acute kidney failure, unspecified: Secondary | ICD-10-CM | POA: Diagnosis not present

## 2022-10-11 LAB — GLUCOSE, CAPILLARY
Glucose-Capillary: 227 mg/dL — ABNORMAL HIGH (ref 70–99)
Glucose-Capillary: 290 mg/dL — ABNORMAL HIGH (ref 70–99)
Glucose-Capillary: 306 mg/dL — ABNORMAL HIGH (ref 70–99)
Glucose-Capillary: 271 mg/dL — ABNORMAL HIGH (ref 70–99)

## 2022-10-11 LAB — HEMOGLOBIN A1C
Hgb A1c MFr Bld: 9.8 % — ABNORMAL HIGH (ref 4.8–5.6)
Mean Plasma Glucose: 235 mg/dL

## 2022-10-11 MED ORDER — ALLOPURINOL 100 MG PO TABS: 100.0000 mg | ORAL_TABLET | Freq: Every day | ORAL | Status: DC

## 2022-10-11 MED ORDER — NAPHAZOLINE-GLYCERIN 0.012-0.25 % OP SOLN
1.0000 [drp] | Freq: Four times a day (QID) | OPHTHALMIC | Status: DC | PRN
  Administered 2022-10-11: 2 [drp] via OPHTHALMIC
  Filled 2022-10-11: qty 15

## 2022-10-11 MED ORDER — OXYCODONE HCL 5 MG PO TABS
5.0000 mg | ORAL_TABLET | ORAL | Status: DC | PRN
  Administered 2022-10-11 – 2022-10-12 (×6): 5 mg via ORAL
  Filled 2022-10-11 (×6): qty 1

## 2022-10-11 MED ORDER — INSULIN GLARGINE-YFGN 100 UNIT/ML ~~LOC~~ SOLN
14.0000 [IU] | Freq: Every day | SUBCUTANEOUS | Status: DC
  Administered 2022-10-11 – 2022-10-12 (×2): 14 [IU] via SUBCUTANEOUS
  Filled 2022-10-11 (×2): qty 0.14

## 2022-10-11 NOTE — Progress Notes (Signed)
PROGRESS NOTE  Bethany [name] ZOX:[id number] DOB: [birth year] DOA: 10/08/2022 PCP: [name], NP   LOS: 3 days   Brief Narrative / Interim history: 72 year old female with history of chronic back pain seeing pain management at [facility], DM2, hypothyroidism, hypertension, hyperlipidemia who comes into the hospital with progressive lethargy for several weeks, frequent episodes of falling asleep when she did not intend to, poor p.o. intake.  She was found to be in acute renal failure, and also oversedated requiring Narcan drip.  At home she is on [facility] ER and Percocet.  She was initially admitted to the ICU and eventually transferred to the hospitalist service 5/28  Subjective / 24h Interval events: She is doing well this morning, she tells me that over the last couple weeks has been falling asleep randomly.  She has had difficulties with her back pain and has been on narcotics since 2013.  She is currently starting to get injections in her back  Assesement and Plan: Principal Problem:   AKI (acute kidney injury) (HCC) Active Problems:   Diabetes mellitus type 2 with neurological manifestations (HCC)   Hyperlipemia, mixed   Hypothyroidism   Acute renal failure superimposed on stage 3b chronic kidney disease (HCC)   GERD (gastroesophageal reflux disease)   Chronic diastolic CHF (congestive heart failure) (HCC)   Metabolic encephalopathy   Toxic encephalopathy   Principal problem Acute toxic encephalopathy -likely in the setting of opiate accumulation given that she is on long-acting Xtampza along with Percocet.  Initially was admitted to the ICU requiring Narcan drip, transferred to the hospitalist service 5/28.  She is alert this morning, appropriate, and has good insight.  She acknowledges that she will need to cut down significantly on her narcotics and/or find alternative ways to manage her pain -Definitely discontinue Xtampza, I will provide low-dose oxycodone since she  is narcotic dependent for the past 11 years. -Up with PT today  Active problems Aspiration-with vomiting, PCCM recommends 5 days of ceftriaxone, could be converted to Augmentin if she is discharging tomorrow  Nausea, vomiting -resolved, possible gastroparesis  Essential hypertension -continue amlodipine  Hyperlipidemia-continue statin  Neuropathic pain-continue gabapentin  Hypothyroidism-continue Synthroid.  TSH was normal at 0.58 on 10/08/2022  DM2 -poorly controlled, with hyperglycemia  Lab Results  Component Value Date   HGBA1C 9.8 (H) 10/08/2022   CBG (last 3)  Recent Labs    10/10/22 0753 10/10/22 1129 10/10/22 1613  GLUCAP 216* 217* 262*   Scheduled Meds:  amLODipine  10 mg Oral Daily   aspirin EC  81 mg Oral Daily   atorvastatin  40 mg Oral Daily   Chlorhexidine Gluconate Cloth  6 each Topical Daily   ezetimibe  10 mg Oral Daily   famotidine  20 mg Oral QHS   gabapentin  200 mg Oral TID   heparin injection (subcutaneous)  5,000 Units Subcutaneous Q8H   insulin aspart  0-15 Units Subcutaneous TID WC   levothyroxine  75 mcg Oral Daily   metoprolol tartrate  50 mg Oral BID   Continuous Infusions:  sodium chloride Stopped (10/10/22 2328)   cefTRIAXone (ROCEPHIN)  IV Stopped (10/10/22 2057)   PRN Meds:.sodium chloride, acetaminophen **OR** acetaminophen, albuterol, hydrALAZINE, lip balm, ondansetron **OR** ondansetron (ZOFRAN) IV, mouth rinse, oxyCODONE, polyethylene glycol powder, senna-docusate, sodium chloride flush  Current Outpatient Medications  Medication Instructions   allopurinol (ZYLOPRIM) 100 mg, Oral, Daily at bedtime   amLODipine (NORVASC) 10 mg, Oral, Daily   aspirin EC 81 mg, Oral, Daily, Swallow  whole.   atorvastatin (LIPITOR) 40 mg, Oral, Daily   baclofen (LIORESAL) 20 mg, Oral, 3 times daily PRN   ezetimibe (ZETIA) 10 mg, Oral, Daily   famotidine (PEPCID) 20 mg, Oral, 2 times daily   feeding supplement, ENSURE ENLIVE, (ENSURE ENLIVE) LIQD  237 mLs, Oral, 2 times daily between meals   furosemide (LASIX) 40 mg, Oral, Daily PRN   gabapentin (NEURONTIN) 200 mg, Oral, 4 times daily   insulin lispro (HUMALOG) 5 Units, Subcutaneous, 3 times daily PRN   Ipratropium-Albuterol (COMBIVENT) 20-100 MCG/ACT AERS respimat 1 puff, Inhalation, Every 6 hours PRN   levothyroxine (SYNTHROID) 75 mcg, Oral, Daily   lidocaine-prilocaine (EMLA) cream APPLY 2-5 cms of cream TO port site 45 MINUTES - ONE HOUR prior TO access   losartan (COZAAR) 25 mg, Oral, Daily   metoprolol tartrate (LOPRESSOR) 50 mg, Oral, 2 times daily   NARCAN 4 MG/0.1ML LIQD nasal spray kit 1 spray, Nasal, As needed   Omega-3 Fatty Acids (FISH OIL PO) 1 capsule, Oral, 2 times daily   oxyCODONE-acetaminophen (PERCOCET) 10-325 MG tablet 1 tablet, Oral, Every 6 hours PRN   pantoprazole (PROTONIX) 40 mg, Oral, BH-each morning   polyethylene glycol powder (MIRALAX) 17 g, Oral, 2 times daily PRN   senna-docusate (SENOKOT-S) 8.6-50 MG tablet 1 tablet, Oral, 2 times daily between meals PRN   Tetrahydrozoline HCl (VISINE OP) 1 drop, Both Eyes, 3 times daily PRN   Toujeo SoloStar 20 Units, Subcutaneous, Daily at bedtime   Trulicity 1.5 mg, Subcutaneous, Every Mon    Diet Orders (From admission, onward)     Start     Ordered   10/08/22 1615  Diet Carb Modified Fluid consistency: Thin; Room service appropriate? Yes  Diet effective now       Question Answer Comment  Diet-HS Snack? Nothing   Calorie Level Medium 1600-2000   Fluid consistency: Thin   Room service appropriate? Yes      10/08/22 1615            DVT prophylaxis: heparin injection 5,000 Units Start: 10/08/22 2200 SCDs Start: 10/08/22 1615   Lab Results  Component Value Date   PLT 260 10/09/2022      Code Status: Full Code  Family Communication: no family at bedside   Status is: Inpatient  Remains inpatient appropriate because: Resuming low-dose narcotics, if remains stable and tolerates and able to work  with PT could potentially go home on Wednesday   Level of care: Med-Surg  Consultants:  PCCM  Objective: Vitals:   10/10/22 1714 10/10/22 2019 10/11/22 0128 10/11/22 0511  BP: (!) 156/67 (!) 150/55 (!) 165/61 (!) 156/42  Pulse: 60 72 (!) 57 (!) 56  Resp: 18 16 16 17   Temp: 99.4 F (37.4 C) 99 F (37.2 C) 98.6 F (37 C) 98.7 F (37.1 C)  TempSrc: Oral Oral Oral Oral  SpO2: 98% 100% 97% 99%  Weight:      Height:        Intake/Output Summary (Last 24 hours) at 10/11/2022 0721 Last data filed at 10/11/2022 0539 Gross per 24 hour  Intake 245.35 ml  Output --  Net 245.35 ml   Wt Readings from Last 3 Encounters:  10/10/22 77.4 kg  08/31/22 76.7 kg  05/20/22 78.9 kg    Examination:  Constitutional: NAD Eyes: no scleral icterus ENMT: Mucous membranes are moist.  Neck: normal, supple Respiratory: clear to auscultation bilaterally, no wheezing, no crackles. Normal respiratory effort. No accessory muscle use.  Cardiovascular: Regular  rate and rhythm, no murmurs / rubs / gallops. No LE edema.  Abdomen: non distended, no tenderness. Bowel sounds positive.  Musculoskeletal: no clubbing / cyanosis.   Data Reviewed: I have independently reviewed following labs and imaging studies   CBC Recent Labs  Lab 10/08/22 1413 10/08/22 1426 10/09/22 0315  WBC 11.2*  --  11.6*  HGB 9.1* 9.9* 8.2*  HCT 31.3* 29.0* 28.8*  PLT 297  --  260  MCV 77.3*  --  78.9*  MCH 22.5*  --  22.5*  MCHC 29.1*  --  28.5*  RDW 17.0*  --  16.9*  LYMPHSABS 1.0  --   --   MONOABS 0.6  --   --   EOSABS 0.1  --   --   BASOSABS 0.0  --   --     Recent Labs  Lab 10/08/22 1413 10/08/22 1414 10/08/22 1426 10/08/22 2156 10/09/22 0315 10/10/22 0911  NA 134*  --  133*  --  141 138  K 5.0  --  5.0  --  3.9 3.7  CL 99  --  101  --  108 107  CO2 24  --   --   --  23 26  GLUCOSE 384*  --  376*  --  117* 208*  BUN 37*  --  34*  --  21 19  CREATININE 2.30*  --  2.30*  --  1.22* 1.07*  CALCIUM 10.1   --   --   --  9.8 9.7  AST 25  --   --   --   --  17  ALT 25  --   --   --   --  19  ALKPHOS 166*  --   --   --   --  122  BILITOT 0.5  --   --   --   --  0.4  ALBUMIN 4.0  --   --   --   --  3.2*  LATICACIDVEN  --  1.4  --   --   --   --   INR 0.9  --   --   --   --   --   TSH 2.592  --   --  0.580  --   --   HGBA1C 9.8*  --   --   --   --   --   AMMONIA  --  16  --   --   --   --     ------------------------------------------------------------------------------------------------------------------ No results for input(s): "CHOL", "HDL", "LDLCALC", "TRIG", "CHOLHDL", "LDLDIRECT" in the last 72 hours.  Lab Results  Component Value Date   HGBA1C 9.8 (H) 10/08/2022   ------------------------------------------------------------------------------------------------------------------ Recent Labs    10/08/22 2156  TSH 0.580    Cardiac Enzymes No results for input(s): "CKMB", "TROPONINI", "MYOGLOBIN" in the last 168 hours.  Invalid input(s): "CK" ------------------------------------------------------------------------------------------------------------------    Component Value Date/Time   BNP 232.2 (H) 03/27/2019 2157    CBG: Recent Labs  Lab 10/09/22 1536 10/09/22 2155 10/10/22 0753 10/10/22 1129 10/10/22 1613  GLUCAP 258* 230* 216* 217* 262*    Recent Results (from the past 240 hour(s))  MRSA Next Gen by PCR, Nasal     Status: None   Collection Time: 10/08/22  8:32 PM   Specimen: Nasal Mucosa; Nasal Swab  Result Value Ref Range Status   MRSA by PCR Next Gen NOT DETECTED NOT DETECTED Final    Comment: (NOTE) The GeneXpert MRSA Assay (FDA  approved for NASAL specimens only), is one component of a comprehensive MRSA colonization surveillance program. It is not intended to diagnose MRSA infection nor to guide or monitor treatment for MRSA infections. Test performance is not FDA approved in patients less than 55 years old. Performed at [facility],  [address], [place], [place] [address]      Radiology Studies: No results found.   Pamella Pert, MD, PhD [facility]  Between 7 am - 7 pm I am available, please contact me via Amion (for emergencies) or Securechat (non urgent messages)  Between 7 pm - 7 am I am not available, please contact night coverage MD/APP via Amion

## 2022-10-11 NOTE — Evaluation (Signed)
Physical Therapy Evaluation Patient Details Name: Bethany [name] MRN: [id number] DOB: Mar 11, 1951 Today's Date: 10/11/2022  History of Present Illness  72 yo female admitted to hospital on 10/08/2022 due to progressive lethargy for several feet, frequent episodes of falling asleep unintentionally, AMS,  and poor PO intake. Pt has hx of chronic back pain and long hx, 13 yrs of narcotics to address pain and currently starting injections in back. Pts home medications include Xtampza ER and Percocet, possible contributors to acute renal failure and oversedation requiring Narcan drip in ICU. Pt continues to present with abn lab findings including HgB of 8.2. Pt PMH includes but is not limited to AKI, DM II, HDL, hypothyroidism, acute renal failure superimposed on CKD IIIb, single kidney, CVA (2021), COPD, HTN, GERD, dCHF, L ankle fx and ORIF (2023), metabolic and toxic encephalopathy as well as neuropathic pain.  Clinical Impression      Pt admitted with above diagnosis.  Pt currently with functional limitations due to the deficits listed below (see PT Problem List). Pt seated EOB when PT arrived. Nurse arrived shortly thereafter to provide pt with medications. Pt had questions and concerns per medication changes and directed to communicate with nurse or MD. Pt is mod I with bed mobility and reports amb short distances in personal room no AD. Pt required S for safety with transfer tasks with instability noted. Gait assessed no AD and wide BOS with lateral sway and instability with min guard 200 feet, pt may benefit from trial with [place] for improved balance. Pt elected to return to EOB, all needs in place. No change in pain report 9/10. Pt will benefit from acute skilled PT  in current and next venue to increase their independence and safety with mobility to allow discharge.      Recommendations for follow up therapy are one component of a multi-disciplinary discharge planning process, led by the attending  physician.  Recommendations may be updated based on patient status, additional functional criteria and insurance authorization.  Follow Up Recommendations       Assistance Recommended at Discharge Set up Supervision/Assistance  Patient can return home with the following  A little help with walking and/or transfers;A little help with bathing/dressing/bathroom;Assistance with cooking/housework;Assist for transportation    Equipment Recommendations Other (comment) (none at this time, TBD based on progression)  Recommendations for Other Services       Functional Status Assessment Patient has had a recent decline in their functional status and demonstrates the ability to make significant improvements in function in a reasonable and predictable amount of time.     Precautions / Restrictions Precautions Precautions: Fall Restrictions Weight Bearing Restrictions: No      Mobility  Bed Mobility Overal bed mobility: Modified Independent             General bed mobility comments: pt seated EOB when PT arrived    Transfers Overall transfer level: Needs assistance Equipment used: None Transfers: Sit to/from Stand Sit to Stand: Supervision           General transfer comment: min cues and some instability noted    Ambulation/Gait Ambulation/Gait assistance: Min guard Gait Distance (Feet): 200 Feet Assistive device: None Gait Pattern/deviations: Staggering right, Staggering left, Wide base of support, Trunk flexed, Decreased step length - right, Decreased step length - left, Decreased dorsiflexion - left Gait velocity: decreased     General Gait Details: increased lateral sway some instability no AD  Stairs  Wheelchair Mobility    Modified Rankin (Stroke Patients Only)       Balance Overall balance assessment: Needs assistance Sitting-balance support: No upper extremity supported, Feet supported Sitting balance-Leahy Scale: Good     Standing  balance support: No upper extremity supported Standing balance-Leahy Scale: Fair Standing balance comment: occationally reaching for external support noted instability                             Pertinent Vitals/Pain Pain Assessment Pain Assessment: 0-10 Pain Score: 9  Pain Location: lower back, bil legs (R>L) Pain Descriptors / Indicators: Discomfort, Radiating, Constant Pain Intervention(s): Limited activity within patient's tolerance, Monitored during session, RN gave pain meds during session, Other (comment) (communicated with nursing per use of Hot pack)    Home Living Family/patient expects to be discharged to:: Private residence Living Arrangements: Alone Available Help at Discharge: Family;Available PRN/intermittently Type of Home: Apartment Home Access: Elevator (on third floor, sometimes takes the steps)       Home Layout: One level Home Equipment: Shower seat;Grab bars - tub/shower;Cane - single point      Prior Function Prior Level of Function : Independent/Modified Independent             Mobility Comments: uses cane as needed in commuinty ADLs Comments: IND with all ADLs, self care tasks, IADLs, driving     Hand Dominance   Dominant Hand: Right    Extremity/Trunk Assessment   Upper Extremity Assessment Upper Extremity Assessment: Generalized weakness    Lower Extremity Assessment Lower Extremity Assessment: Generalized weakness (abn sensation B LEs)       Communication   Communication: No difficulties  Cognition Arousal/Alertness: Awake/alert Behavior During Therapy: WFL for tasks assessed/performed Overall Cognitive Status: Within Functional Limits for tasks assessed                                 General Comments: pt scored WFL on short blessed test, she reports feeling more like herself today.  She does endorse difficulty with remembering to take her medications at the right time.  DIscussed compesnatory  tehcniques and educated pt on how to set alarms on her phone to assist.        General Comments      Exercises     Assessment/Plan    PT Assessment Patient needs continued PT services  PT Problem List Decreased strength;Decreased activity tolerance;Decreased balance;Decreased mobility;Decreased coordination;Pain       PT Treatment Interventions Gait training;Functional mobility training;Therapeutic activities;Therapeutic exercise;Balance training;Neuromuscular re-education;Patient/family education;Modalities    PT Goals (Current goals can be found in the Care Plan section)  Acute Rehab PT Goals Patient Stated Goal: to do better managing pain and go home PT Goal Formulation: With patient Time For Goal Achievement: 10/25/22 Potential to Achieve Goals: Good    Frequency Min 1X/week     Co-evaluation               AM-PAC PT "6 Clicks" Mobility  Outcome Measure Help needed turning from your back to your side while in a flat bed without using bedrails?: None Help needed moving from lying on your back to sitting on the side of a flat bed without using bedrails?: None Help needed moving to and from a bed to a chair (including a wheelchair)?: A Little Help needed standing up from a chair using your arms (e.g., wheelchair or  bedside chair)?: A Little Help needed to walk in hospital room?: A Little Help needed climbing 3-5 steps with a railing? : A Lot 6 Click Score: 19    End of Session Equipment Utilized During Treatment: Gait belt Activity Tolerance: No increased pain;Patient tolerated treatment well Patient left: in bed;with call bell/phone within reach Nurse Communication: Mobility status;Other (comment) (hot pack and medications) PT Visit Diagnosis: Unsteadiness on feet (R26.81);Other abnormalities of gait and mobility (R26.89);History of falling (Z91.81);Difficulty in walking, not elsewhere classified (R26.2);Pain (fall 2023 with L ankle fx pt indicates no recent  falls) Pain - Right/Left: Right Pain - part of body: Leg (LBP)    Time: 1039-1100 PT Time Calculation (min) (ACUTE ONLY): 21 min   Charges:   PT Evaluation $PT Eval Low Complexity: 1 Low          Johnny Bridge, PT Acute Rehab   Jacqualyn Posey 10/11/2022, 12:35 PM

## 2022-10-11 NOTE — Inpatient Diabetes Management (Signed)
Inpatient Diabetes Program Recommendations  AACE/ADA: New Consensus Statement on Inpatient Glycemic Control (2015)  Target Ranges:  Prepandial:   less than 140 mg/dL      Peak postprandial:   less than 180 mg/dL (1-2 hours)      Critically ill patients:  140 - 180 mg/dL   Lab Results  Component Value Date   GLUCAP 290 (H) 10/11/2022   HGBA1C 9.8 (H) 10/08/2022    Review of Glycemic Control  Latest Reference Range & Units 10/09/22 15:36 10/09/22 21:55 10/10/22 07:53 10/10/22 11:29 10/10/22 16:13 10/11/22 07:53 10/11/22 11:08  Glucose-Capillary 70 - 99 mg/dL 086 (H) 578 (H) 469 (H) 217 (H) 262 (H) 227 (H) 290 (H)   Diabetes history: DM  Outpatient Diabetes medications:  Trulicity 1.5 mg weekly Toujeo 20 units q HS Humalog 5 units tid with meals  Current orders for Inpatient glycemic control:  Novolog 0-15 units tid with meals   Inpatient Diabetes Program Recommendations:    Please consider adding Semglee 14 units daily and Novolog 3 units tid with meals (hold if patient eats less than 50% or NPO).   Thanks,  Beryl Meager, RN, BC-ADM Inpatient Diabetes Coordinator Pager [phone]  (8a-5p)

## 2022-10-11 NOTE — Evaluation (Signed)
Occupational Therapy Evaluation Patient Details Name: Bethany [name] MRN: [id number] DOB: December 02, 1950 Today's Date: 10/11/2022   History of Present Illness Pt is a 72 y/o female presenting on 5/25 with progressive lethargy, AMS, and poor PO intake. Pt found with acute renal failure and oversedation, required Narcan drip in ICU. Admitted with acute toxic encephalopathy.  PMH includes: chronic back pain, AKI, DM2, arthritis, COPD, HTN, PNA, CVA, R breast lumpectomy.   Clinical Impression   PTA patient independent and driving.  She reports receiving medications in pill packs, managing all IADLs; using cane and shower chair as needed for mobility/ADLs.  Admitted for above and presents with problem list below.  She requires min guard to close supervision for mobility, providing handheld assist as needed due to mild instability and weakness as pt reaching out for support.  She completes ADLs with min guard to close supervision.  She was educated on safety, ADL compensatory techniques and recommendations.  Pts cognition appears WFL, but she does endorse some difficulty with taking her medications on time- educated on compensatory techniques, specifically using alarms on her phone to assist.  Based on performance today, believe she will best benefit from further services acutely to optimize independence and return to [facility] but anticipate she will progress well with no further OT needs required after dc.       Recommendations for follow up therapy are one component of a multi-disciplinary discharge planning process, led by the attending physician.  Recommendations may be updated based on patient status, additional functional criteria and insurance authorization.   Assistance Recommended at Discharge Intermittent Supervision/Assistance  Patient can return home with the following A little help with walking and/or transfers;A little help with bathing/dressing/bathroom;Assistance with cooking/housework;Direct  supervision/assist for medications management;Direct supervision/assist for financial management;Assist for transportation;Help with stairs or ramp for entrance    Functional Status Assessment  Patient has had a recent decline in their functional status and demonstrates the ability to make significant improvements in function in a reasonable and predictable amount of time.  Equipment Recommendations  None recommended by OT    Recommendations for Other Services       Precautions / Restrictions Precautions Precautions: Fall Restrictions Weight Bearing Restrictions: No      Mobility Bed Mobility               General bed mobility comments: EOB upon entry    Transfers Overall transfer level: Needs assistance Equipment used: None Transfers: Sit to/from Stand Sit to Stand: Supervision           General transfer comment: supervision to stand from EOB      Balance Overall balance assessment: Needs assistance Sitting-balance support: No upper extremity supported, Feet supported Sitting balance-Leahy Scale: Good     Standing balance support: Single extremity supported, No upper extremity supported, During functional activity Standing balance-Leahy Scale: Fair Standing balance comment: pt reaching for UB support, 1 handheld assist or furniture walking                           ADL either performed or assessed with clinical judgement   ADL Overall ADL's : Needs assistance/impaired     Grooming: Supervision/safety;Standing           Upper Body Dressing : Set up;Sitting   Lower Body Dressing: Min guard;Sit to/from stand   Toilet Transfer: Min guard;Ambulation Toilet Transfer Details (indicate cue type and reason): no AD, unsteady and reaching for UE support  Functional mobility during ADLs: Min guard;Supervision/safety General ADL Comments: pt with mild instability     Vision   Vision Assessment?: No apparent visual deficits      Perception     Praxis      Pertinent Vitals/Pain Pain Assessment Pain Assessment: 0-10 Pain Score: 6  Pain Location: lower back, bil legs (R>L) Pain Descriptors / Indicators: Discomfort Pain Intervention(s): Limited activity within patient's tolerance, Monitored during session, Repositioned, Premedicated before session     Hand Dominance Right   Extremity/Trunk Assessment Upper Extremity Assessment Upper Extremity Assessment: Generalized weakness   Lower Extremity Assessment Lower Extremity Assessment: Defer to PT evaluation       Communication Communication Communication: No difficulties   Cognition Arousal/Alertness: Awake/alert Behavior During Therapy: WFL for tasks assessed/performed Overall Cognitive Status: Within Functional Limits for tasks assessed                                 General Comments: pt scored WFL on short blessed test, she reports feeling more like herself today.  She does endorse difficulty with remembering to take her medications at the right time.  DIscussed compesnatory tehcniques and educated pt on how to set alarms on her phone to assist.     General Comments       Exercises     Shoulder Instructions      Home Living Family/patient expects to be discharged to:: Private residence Living Arrangements: Alone Available Help at Discharge: Family;Available PRN/intermittently Type of Home: Apartment Home Access: Elevator (to 3rd floor)     Home Layout: One level     Bathroom Shower/Tub: Chief Strategy Officer: Standard     Home Equipment: Shower seat;Grab bars - tub/shower;Cane - single point          Prior Functioning/Environment Prior Level of Function : Independent/Modified Independent             Mobility Comments: uses cane as needed in commuinty ADLs Comments: very independent, driving        OT Problem List: Decreased strength;Decreased activity tolerance;Impaired balance (sitting  and/or standing);Decreased safety awareness;Decreased knowledge of use of DME or AE      OT Treatment/Interventions: Self-care/ADL training;DME and/or AE instruction;Energy conservation;Therapeutic activities;Patient/family education;Balance training;Cognitive remediation/compensation    OT Goals(Current goals can be found in the care plan section) Acute Rehab OT Goals Patient Stated Goal: home OT Goal Formulation: With patient Time For Goal Achievement: 10/25/22 Potential to Achieve Goals: Good  OT Frequency: Min 1X/week    Co-evaluation              AM-PAC OT "6 Clicks" Daily Activity     Outcome Measure Help from another person eating meals?: None Help from another person taking care of personal grooming?: A Little Help from another person toileting, which includes using toliet, bedpan, or urinal?: A Little Help from another person bathing (including washing, rinsing, drying)?: A Little Help from another person to put on and taking off regular upper body clothing?: A Little Help from another person to put on and taking off regular lower body clothing?: A Little 6 Click Score: 19   End of Session Nurse Communication: Mobility status  Activity Tolerance: Patient tolerated treatment well Patient left: in bed;with call bell/phone within reach  OT Visit Diagnosis: Other abnormalities of gait and mobility (R26.89);Muscle weakness (generalized) (M62.81)  Time: 0102-7253 OT Time Calculation (min): 26 min Charges:  OT General Charges $OT Visit: 1 Visit OT Evaluation $OT Eval Moderate Complexity: 1 Mod OT Treatments $Self Care/Home Management : 8-22 mins  Barry Brunner, OT Acute Rehabilitation Services Office [phone]   Chancy Milroy 10/11/2022, 12:21 PM

## 2022-10-12 ENCOUNTER — Inpatient Hospital Stay: Payer: 59 | Attending: Hematology

## 2022-10-12 DIAGNOSIS — I5032 Chronic diastolic (congestive) heart failure: Secondary | ICD-10-CM | POA: Diagnosis not present

## 2022-10-12 DIAGNOSIS — N179 Acute kidney failure, unspecified: Secondary | ICD-10-CM | POA: Diagnosis not present

## 2022-10-12 DIAGNOSIS — G929 Unspecified toxic encephalopathy: Secondary | ICD-10-CM

## 2022-10-12 LAB — CBC
HCT: 18.7 % — ABNORMAL LOW (ref 36.0–46.0)
Hemoglobin: 5.3 g/dL — CL (ref 12.0–15.0)
MCH: 22.4 pg — ABNORMAL LOW (ref 26.0–34.0)
MCHC: 28.3 g/dL — ABNORMAL LOW (ref 30.0–36.0)
MCV: 78.9 fL — ABNORMAL LOW (ref 80.0–100.0)
Platelets: 179 10*3/uL (ref 150–400)
RBC: 2.37 MIL/uL — ABNORMAL LOW (ref 3.87–5.11)
RDW: 17.6 % — ABNORMAL HIGH (ref 11.5–15.5)
WBC: 6.7 10*3/uL (ref 4.0–10.5)
nRBC: 0.6 % — ABNORMAL HIGH (ref 0.0–0.2)

## 2022-10-12 LAB — COMPREHENSIVE METABOLIC PANEL
ALT: 15 U/L (ref 0–44)
AST: 14 U/L — ABNORMAL LOW (ref 15–41)
Albumin: 2.1 g/dL — ABNORMAL LOW (ref 3.5–5.0)
Alkaline Phosphatase: 76 U/L (ref 38–126)
Anion gap: 3 — ABNORMAL LOW (ref 5–15)
BUN: 16 mg/dL (ref 8–23)
CO2: 20 mmol/L — ABNORMAL LOW (ref 22–32)
Calcium: 7.4 mg/dL — ABNORMAL LOW (ref 8.9–10.3)
Chloride: 114 mmol/L — ABNORMAL HIGH (ref 98–111)
Creatinine, Ser: 0.76 mg/dL (ref 0.44–1.00)
GFR, Estimated: >60 mL/min (ref >60)
Glucose, Bld: 184 mg/dL — ABNORMAL HIGH (ref 70–99)
Potassium: 3.3 mmol/L — ABNORMAL LOW (ref 3.5–5.1)
Sodium: 137 mmol/L (ref 135–145)
Total Bilirubin: 0.2 mg/dL — ABNORMAL LOW (ref 0.3–1.2)
Total Protein: 4.3 g/dL — ABNORMAL LOW (ref 6.5–8.1)

## 2022-10-12 LAB — TYPE AND SCREEN: Antibody Screen: NEGATIVE

## 2022-10-12 LAB — HEMOGLOBIN AND HEMATOCRIT, BLOOD
HCT: 26.5 % — ABNORMAL LOW (ref 36.0–46.0)
Hemoglobin: 7.5 g/dL — ABNORMAL LOW (ref 12.0–15.0)

## 2022-10-12 LAB — IRON AND TIBC
Iron: 35 ug/dL (ref 28–170)
Saturation Ratios: 8 % — ABNORMAL LOW (ref 10.4–31.8)
TIBC: 421 ug/dL (ref 250–450)
UIBC: 386 ug/dL

## 2022-10-12 LAB — FERRITIN: Ferritin: 13 ng/mL (ref 11–307)

## 2022-10-12 LAB — PREPARE RBC (CROSSMATCH)

## 2022-10-12 LAB — GLUCOSE, CAPILLARY
Glucose-Capillary: 242 mg/dL — ABNORMAL HIGH (ref 70–99)
Glucose-Capillary: 349 mg/dL — ABNORMAL HIGH (ref 70–99)

## 2022-10-12 LAB — BPAM RBC

## 2022-10-12 LAB — MAGNESIUM: Magnesium: 1.3 mg/dL — ABNORMAL LOW (ref 1.7–2.4)

## 2022-10-12 MED ORDER — SODIUM CHLORIDE 0.9 % IV SOLN
510.0000 mg | Freq: Once | INTRAVENOUS | Status: AC
  Administered 2022-10-12: 510 mg via INTRAVENOUS
  Filled 2022-10-12: qty 510

## 2022-10-12 MED ORDER — LIVING WELL WITH DIABETES BOOK
Freq: Once | Status: AC
  Filled 2022-10-12: qty 1

## 2022-10-12 MED ORDER — SODIUM CHLORIDE 0.9% IV SOLUTION: Freq: Once | INTRAVENOUS | Status: AC

## 2022-10-12 MED ORDER — PANTOPRAZOLE SODIUM 40 MG PO TBEC
40.0000 mg | DELAYED_RELEASE_TABLET | Freq: Two times a day (BID) | ORAL | Status: DC
  Administered 2022-10-12: 40 mg via ORAL
  Filled 2022-10-12: qty 1

## 2022-10-12 MED ORDER — INSULIN ASPART 100 UNIT/ML IJ SOLN
4.0000 [IU] | Freq: Three times a day (TID) | INTRAMUSCULAR | Status: DC
  Administered 2022-10-12: 4 [IU] via SUBCUTANEOUS

## 2022-10-12 MED ORDER — MAGNESIUM SULFATE 2 GM/50ML IV SOLN
2.0000 g | Freq: Once | INTRAVENOUS | Status: AC
  Administered 2022-10-12: 2 g via INTRAVENOUS
  Filled 2022-10-12: qty 50

## 2022-10-12 MED ORDER — INSULIN GLARGINE-YFGN 100 UNIT/ML ~~LOC~~ SOLN: 20.0000 [IU] | Freq: Every day | SUBCUTANEOUS | Status: DC

## 2022-10-12 MED ORDER — HEPARIN SOD (PORK) LOCK FLUSH 100 UNIT/ML IV SOLN
500.0000 [IU] | Freq: Once | INTRAVENOUS | Status: AC
  Administered 2022-10-12: 500 [IU] via INTRAVENOUS
  Filled 2022-10-12: qty 5

## 2022-10-12 MED ORDER — OXYCODONE HCL 5 MG PO TABS: 5.0000 mg | ORAL_TABLET | Freq: Four times a day (QID) | ORAL | 0 refills | Status: DC | PRN

## 2022-10-12 MED ORDER — GABAPENTIN 100 MG PO CAPS: 200.0000 mg | ORAL_CAPSULE | Freq: Three times a day (TID) | ORAL | Status: AC

## 2022-10-12 MED ORDER — PANTOPRAZOLE SODIUM 40 MG PO TBEC: 40.0000 mg | DELAYED_RELEASE_TABLET | Freq: Two times a day (BID) | ORAL | 0 refills | Status: AC

## 2022-10-12 MED ORDER — POTASSIUM CHLORIDE CRYS ER 20 MEQ PO TBCR
40.0000 meq | EXTENDED_RELEASE_TABLET | ORAL | Status: AC
  Administered 2022-10-12 (×2): 40 meq via ORAL
  Filled 2022-10-12 (×2): qty 2

## 2022-10-12 NOTE — Discharge Summary (Signed)
Physician Discharge Summary  Bethany [name] ZOX:[id number] DOB: 23-Jun-1950 DOA: 10/08/2022  PCP: [name], NP  Admit date: 10/08/2022 Discharge date: 10/12/2022  Admitted From: Home Disposition: Home  Recommendations for Outpatient Follow-up:  Follow up with PCP in 1 week with repeat CBC/BMP Outpatient follow-up with pain management Outpatient follow-up with hematology/Dr. Mosetta Putt Follow up in ED if symptoms worsen or new appear   Home Health: No Equipment/Devices: None  Discharge Condition: Stable CODE STATUS: Full Diet recommendation: Heart healthy  Brief/Interim Summary: 72 year old female with history of chronic back pain seeing pain management at [facility], DM2, hypothyroidism, hypertension, hyperlipidemia who presented to the hospital with progressive lethargy for several weeks, frequent episodes of falling asleep when she did not intend to, poor p.o. intake. She was found to be in acute renal failure, and also oversedated requiring Narcan drip. At home she is on [facility] ER and Percocet. She was initially admitted to the ICU and eventually transferred to the hospitalist service 5/28.  Subsequently, her mental status has remained stable and she is on room air.  Her hemoglobin was 5.3 this morning but repeat was 7.5, hence 5.3 was probably an error.  She has tolerated PT well.  She will be discharged home today with outpatient follow-up with PCP/pain management/hematology.  Discharge Diagnoses:   Acute toxic encephalopathy  -likely in the setting of opiate accumulation given that she was on long-acting Xtampza along with Percocet.  Initially was admitted to the ICU requiring Narcan drip, transferred to the hospitalist service 5/28.   -Mental status has much improved and she is back to her baseline.  She acknowledges that she will need to cut down significantly on her narcotics and/or find alternative ways to manage her pain -discontinue Xtampza; low-dose oxycodone has been  started which will be continued on discharge -Outpatient follow-up with pain management.  Tolerated PT.  Discharge patient home today.  Aspiration pneumonitis -with vomiting, PCCM recommended 5 days of ceftriaxone: Today is day 5 of Rocephin.  No need for any more antibiotics on discharge.  Currently on room air.    Iron deficiency anemia -Has required iron infusions in the past and follows up with hematology/Dr. Mosetta Putt.  Will give 1 dose of IV iron today.  Outpatient follow-up with hematology.  Also will empirically put her on Protonix twice a day.  Repeat CBC within a week and follow-up with PCP.  Nausea, vomiting -resolved, possible gastroparesis.  Outpatient follow-up.   Essential hypertension -continue home regimen.  Outpatient follow-up  Hyperlipidemia-continue statin   Neuropathic pain-continue gabapentin at a lower dose   Hypothyroidism-continue Synthroid.  TSH was normal at 0.58 on 10/08/2022   DM2 -poorly controlled, with hyperglycemia.  Continue home regimen.  Carb modified diet.  Discharge Instructions  Discharge Instructions     Diet - low sodium heart healthy   Complete by: As directed    Diet Carb Modified   Complete by: As directed    Increase activity slowly   Complete by: As directed       Allergies as of 10/12/2022       Reactions   Contrast Media [iodinated Contrast Media]    Renal hypertension per physician   Other Other (See Comments)   No ASA/Anti-Coag due to GI bleed        Medication List     STOP taking these medications    aspirin EC 81 MG tablet   baclofen 10 MG tablet Commonly known as: LIORESAL   oxyCODONE-acetaminophen 10-325 MG tablet  Commonly known as: Scientist, product/process development ER 18 MG C12a Generic drug: oxyCODONE ER       TAKE these medications    allopurinol 100 MG tablet Commonly known as: ZYLOPRIM Take 100 mg by mouth at bedtime.   amLODipine 10 MG tablet Commonly known as: NORVASC Take 10 mg by mouth daily.    atorvastatin 40 MG tablet Commonly known as: LIPITOR Take 40 mg by mouth daily.   ezetimibe 10 MG tablet Commonly known as: ZETIA Take 10 mg by mouth daily.   famotidine 20 MG tablet Commonly known as: PEPCID Take 20 mg by mouth 2 (two) times daily.   feeding supplement Liqd Take 237 mLs by mouth 2 (two) times daily between meals.   FISH OIL PO Take 1 capsule by mouth 2 (two) times daily.   furosemide 40 MG tablet Commonly known as: LASIX Take 40 mg by mouth daily as needed for fluid or edema.   gabapentin 100 MG capsule Commonly known as: NEURONTIN Take 2 capsules (200 mg total) by mouth 3 (three) times daily. What changed: when to take this   insulin lispro 100 UNIT/ML KwikPen Commonly known as: HUMALOG Inject 5 Units into the skin 3 (three) times daily as needed (blood sugar >200).   Ipratropium-Albuterol 20-100 MCG/ACT Aers respimat Commonly known as: COMBIVENT Inhale 1 puff into the lungs every 6 (six) hours as needed for wheezing or shortness of breath.   levothyroxine 75 MCG tablet Commonly known as: SYNTHROID Take 75 mcg by mouth daily.   lidocaine-prilocaine cream Commonly known as: EMLA APPLY 2-5 cms of cream TO port site 45 MINUTES - ONE HOUR prior TO access   losartan 25 MG tablet Commonly known as: Cozaar Take 1 tablet (25 mg total) by mouth daily.   metoprolol tartrate 50 MG tablet Commonly known as: LOPRESSOR Take 50 mg by mouth 2 (two) times daily.   Narcan 4 MG/0.1ML Liqd nasal spray kit Generic drug: naloxone Place 1 spray into the nose as needed for opioid reversal.   oxyCODONE 5 MG immediate release tablet Commonly known as: Oxy IR/ROXICODONE Take 1 tablet (5 mg total) by mouth every 6 (six) hours as needed for severe pain or breakthrough pain.   pantoprazole 40 MG tablet Commonly known as: PROTONIX Take 1 tablet (40 mg total) by mouth 2 (two) times daily before a meal. What changed: when to take this   polyethylene glycol powder  17 GM/SCOOP powder Commonly known as: MiraLax Take 17 g by mouth 2 (two) times daily as needed for moderate constipation.   senna-docusate 8.6-50 MG tablet Commonly known as: Senokot-S Take 1 tablet by mouth 2 (two) times daily between meals as needed for mild constipation.   Toujeo SoloStar 300 UNIT/ML Solostar Pen Generic drug: insulin glargine (1 Unit Dial) Inject 20 Units into the skin at bedtime.   Trulicity 1.5 MG/0.5ML Sopn Generic drug: Dulaglutide Inject 1.5 mg into the skin every Monday.   VISINE OP Place 1 drop into both eyes 3 (three) times daily as needed (dry eyes).        Follow-up Information     [name], NP. Schedule an appointment as soon as possible for a visit in 1 week(s).   Specialty: Nurse Practitioner Why: with repeat cbc/bmp Contact information: [address] [place] [address] [phone]         Malachy Mood, MD. Schedule an appointment as soon as possible for a visit in 1 week(s).   Specialties: Hematology, Oncology Contact information:  [address] [place] [address] [phone]                Allergies  Allergen Reactions   Contrast Media [Iodinated Contrast Media]     Renal hypertension per physician   Other Other (See Comments)    No ASA/Anti-Coag due to GI bleed    Consultations: PCCM   Procedures/Studies: DG Chest Port 1 View  Result Date: 10/08/2022 CLINICAL DATA:  Altered mental status. EXAM: PORTABLE CHEST 1 VIEW COMPARISON:  May 03, 2022 FINDINGS: Injectable port terminates at the expected location of the cavoatrial junction. Calcific atherosclerotic disease of the aorta. Mildly enlarged cardiac silhouette. Mediastinal contours appear intact. There is no evidence of focal airspace consolidation, pleural effusion or pneumothorax. Osseous structures are without acute abnormality. Soft tissues are grossly normal. IMPRESSION: 1. Mildly enlarged cardiac silhouette. 2. No active disease.  Electronically Signed   By: Ted Mcalpine M.D.   On: 10/08/2022 15:12   CT Head Wo Contrast  Result Date: 10/08/2022 CLINICAL DATA:  Altered mental status. EXAM: CT HEAD WITHOUT CONTRAST TECHNIQUE: Contiguous axial images were obtained from the base of the skull through the vertex without intravenous contrast. RADIATION DOSE REDUCTION: This exam was performed according to the departmental dose-optimization program which includes automated exposure control, adjustment of the mA and/or kV according to patient size and/or use of iterative reconstruction technique. COMPARISON:  MRI brain dated June 02, 2021. CT head dated May 04, 2020. FINDINGS: Brain: No evidence of acute infarction, hemorrhage, hydrocephalus, extra-axial collection or mass lesion/mass effect. Old infarct in the posterior left temporal lobe again noted. Vascular: Atherosclerotic vascular calcification of the carotid siphons. No hyperdense vessel. Left MCA aneurysm embolization again noted. Skull: Normal. Negative for fracture or focal lesion. Sinuses/Orbits: No acute finding. Other: None. IMPRESSION: 1. No acute intracranial abnormality. 2. Old infarct in the posterior left temporal lobe. Electronically Signed   By: Obie Dredge M.D.   On: 10/08/2022 15:12      Subjective: Patient seen and examined at bedside.  Denies worsening shortness of breath, nausea, vomiting or fever.  Discharge Exam: Vitals:   10/12/22 0430 10/12/22 1317  BP: (!) 131/46 (!) 138/53  Pulse: (!) 56 63  Resp: 17 20  Temp: 99.3 F (37.4 C) 97.8 F (36.6 C)  SpO2: 99% 99%    General: Pt is alert, awake, not in acute distress.  On room air. Cardiovascular: rate controlled, S1/S2 + Respiratory: bilateral decreased breath sounds at bases with scattered crackles Abdominal: Soft, NT, ND, bowel sounds + Extremities: no edema, no cyanosis    The results of significant diagnostics from this hospitalization (including imaging, microbiology,  ancillary and laboratory) are listed below for reference.     Microbiology: Recent Results (from the past 240 hour(s))  MRSA Next Gen by PCR, Nasal     Status: None   Collection Time: 10/08/22  8:32 PM   Specimen: Nasal Mucosa; Nasal Swab  Result Value Ref Range Status   MRSA by PCR Next Gen NOT DETECTED NOT DETECTED Final    Comment: (NOTE) The GeneXpert MRSA Assay (FDA approved for NASAL specimens only), is one component of a comprehensive MRSA colonization surveillance program. It is not intended to diagnose MRSA infection nor to guide or monitor treatment for MRSA infections. Test performance is not FDA approved in patients less than 6 years old. Performed at [facility], [address] [place], [place] [address]      Labs: BNP (last 3 results) No  results for input(s): "BNP" in the last 8760 hours. Basic Metabolic Panel: Recent Labs  Lab 10/08/22 1413 10/08/22 1426 10/09/22 0315 10/10/22 0911 10/12/22 0520  NA 134* 133* 141 138 137  K 5.0 5.0 3.9 3.7 3.3*  CL 99 101 108 107 114*  CO2 24  --  23 26 20*  GLUCOSE 384* 376* 117* 208* 184*  BUN 37* 34* 21 19 16   CREATININE 2.30* 2.30* 1.22* 1.07* 0.76  CALCIUM 10.1  --  9.8 9.7 7.4*  MG  --   --   --   --  1.3*   Liver Function Tests: Recent Labs  Lab 10/08/22 1413 10/10/22 0911 10/12/22 0520  AST 25 17 14*  ALT 25 19 15   ALKPHOS 166* 122 76  BILITOT 0.5 0.4 0.2*  PROT 7.6 6.6 4.3*  ALBUMIN 4.0 3.2* 2.1*   No results for input(s): "LIPASE", "AMYLASE" in the last 168 hours. Recent Labs  Lab 10/08/22 1414  AMMONIA 16   CBC: Recent Labs  Lab 10/08/22 1413 10/08/22 1426 10/09/22 0315 10/12/22 0520 10/12/22 1025  WBC 11.2*  --  11.6* 6.7  --   NEUTROABS 9.5*  --   --   --   --   HGB 9.1* 9.9* 8.2* 5.3* 7.5*  HCT 31.3* 29.0* 28.8* 18.7* 26.5*  MCV 77.3*  --  78.9* 78.9*  --   PLT 297  --  260 179  --    Cardiac Enzymes: No results for input(s): "CKTOTAL", "CKMB", "CKMBINDEX",  "TROPONINI" in the last 168 hours. BNP: Invalid input(s): "POCBNP" CBG: Recent Labs  Lab 10/11/22 1108 10/11/22 1647 10/11/22 2122 10/12/22 0801 10/12/22 1120  GLUCAP 290* 306* 271* 242* 349*   D-Dimer No results for input(s): "DDIMER" in the last 72 hours. Hgb A1c No results for input(s): "HGBA1C" in the last 72 hours. Lipid Profile No results for input(s): "CHOL", "HDL", "LDLCALC", "TRIG", "CHOLHDL", "LDLDIRECT" in the last 72 hours. Thyroid function studies No results for input(s): "TSH", "T4TOTAL", "T3FREE", "THYROIDAB" in the last 72 hours.  Invalid input(s): "FREET3" Anemia work up Recent Labs    10/12/22 1025  FERRITIN 13  TIBC 421  IRON 35   Urinalysis    Component Value Date/Time   COLORURINE YELLOW 10/08/2022 1415   APPEARANCEUR CLEAR 10/08/2022 1415   LABSPEC 1.019 10/08/2022 1415   PHURINE 5.0 10/08/2022 1415   GLUCOSEU 150 (A) 10/08/2022 1415   HGBUR NEGATIVE 10/08/2022 1415   BILIRUBINUR NEGATIVE 10/08/2022 1415   KETONESUR NEGATIVE 10/08/2022 1415   PROTEINUR 30 (A) 10/08/2022 1415   NITRITE NEGATIVE 10/08/2022 1415   LEUKOCYTESUR NEGATIVE 10/08/2022 1415   Sepsis Labs Recent Labs  Lab 10/08/22 1413 10/09/22 0315 10/12/22 0520  WBC 11.2* 11.6* 6.7   Microbiology Recent Results (from the past 240 hour(s))  MRSA Next Gen by PCR, Nasal     Status: None   Collection Time: 10/08/22  8:32 PM   Specimen: Nasal Mucosa; Nasal Swab  Result Value Ref Range Status   MRSA by PCR Next Gen NOT DETECTED NOT DETECTED Final    Comment: (NOTE) The GeneXpert MRSA Assay (FDA approved for NASAL specimens only), is one component of a comprehensive MRSA colonization surveillance program. It is not intended to diagnose MRSA infection nor to guide or monitor treatment for MRSA infections. Test performance is not FDA approved in patients less than 71 years old. Performed at [facility], [address] [place], [place] [address]       Time coordinating  discharge: 35 minutes  SIGNED:   Glade Lloyd, MD  [facility] 10/12/2022, 1:41 PM

## 2022-10-12 NOTE — Progress Notes (Signed)
Discharge instructions and papers reviewed with patient. Port de-accessed.  She is waiting for her daughter to bring her some clothes to wear home.

## 2022-10-12 NOTE — Progress Notes (Addendum)
Physical Therapy Treatment Patient Details Name: Bethany [name] MRN: [id number] DOB: Aug 01, 1950 Today's Date: 10/12/2022   History of Present Illness 72 yo female admitted to hospital on 10/08/2022 due to progressive lethargy for several feet, frequent episodes of falling asleep unintentionally, AMS,  and poor PO intake. Pt has hx of chronic back pain and long hx, 13 yrs of narcotics to address pain and currently starting injections in back. Pts home medications include Xtampza ER and Percocet, possible contributors to acute renal failure and oversedation requiring Narcan drip in ICU. Pt continues to present with abn lab findings including HgB of 8.2. Pt PMH includes but is not limited to AKI, DM II, HDL, hypothyroidism, acute renal failure superimposed on CKD IIIb, single kidney, CVA (2021), COPD, HTN, GERD, dCHF, L ankle fx and ORIF (2023), metabolic and toxic encephalopathy as well as neuropathic pain.    PT Comments    Educated pt on breathing with mobility, avoiding breath holding and exhaling with rising to stand, ambulation and rolling over in bed to improve pain. Pt ambulates 180 ft in hallway, declines AD use, slow cadence with short choppy steps, no trunk rotation and no arm swing, flat foot posture with decreased heel-toe pattern. Pt performs STS transfers without AD, slow to power up with hip extension noted last, no overt LOB. Pt without increased pain with ambulation, wanting to complete washup with nursing next so remains seated EOB at EOS. Pt denies dizziness, SOB, chest pain, racing sensation throughout session.   Recommendations for follow up therapy are one component of a multi-disciplinary discharge planning process, led by the attending physician.  Recommendations may be updated based on patient status, additional functional criteria and insurance authorization.  Follow Up Recommendations       Assistance Recommended at Discharge Set up Supervision/Assistance  Patient can return  home with the following A little help with walking and/or transfers;A little help with bathing/dressing/bathroom;Assistance with cooking/housework;Assist for transportation   Equipment Recommendations  Other (comment) (none at this time, TBD based on progression)    Recommendations for Other Services       Precautions / Restrictions Precautions Precautions: Fall Restrictions Weight Bearing Restrictions: No     Mobility  Bed Mobility  General bed mobility comments: sitting EOB upon arrival    Transfers Overall transfer level: Needs assistance Equipment used: None Transfers: Sit to/from Stand Sit to Stand: Supervision   Ambulation/Gait Ambulation/Gait assistance: Supervision Gait Distance (Feet): 180 Feet Assistive device: None Gait Pattern/deviations: Wide base of support, Decreased step length - left, Decreased step length - right, Decreased stride length Gait velocity: decreased  General Gait Details: short choppy steps with increased lateral weight-shifting and wide BOS, no trunk rotation, no BUE swing, minimal cervical rotation R/L to clear past obstacles, decreased heel-toe gait pattern, 2 standing rest breaks due to pain, declines AD use, no overt LOB, limited by pain   Stairs             Wheelchair Mobility    Modified Rankin (Stroke Patients Only)       Balance Overall balance assessment: Needs assistance Sitting-balance support: No upper extremity supported, Feet supported Sitting balance-Leahy Scale: Good  Standing balance support: No upper extremity supported Standing balance-Leahy Scale: Fair       Cognition Arousal/Alertness: Awake/alert Behavior During Therapy: WFL for tasks assessed/performed Overall Cognitive Status: Within Functional Limits for tasks assessed               Exercises      General  Comments        Pertinent Vitals/Pain Pain Assessment Pain Assessment: 0-10 Pain Score: 7  Pain Location: lower back and BLE  (R>L) Pain Descriptors / Indicators: Discomfort, Radiating, Constant, Burning Pain Intervention(s): Limited activity within patient's tolerance, Monitored during session, Premedicated before session, Repositioned    Home Living                          Prior Function            PT Goals (current goals can now be found in the care plan section) Acute Rehab PT Goals Patient Stated Goal: to do better managing pain and go home PT Goal Formulation: With patient Time For Goal Achievement: 10/25/22 Potential to Achieve Goals: Good Progress towards PT goals: Progressing toward goals    Frequency    Min 1X/week      PT Plan Current plan remains appropriate    Co-evaluation              AM-PAC PT "6 Clicks" Mobility   Outcome Measure  Help needed turning from your back to your side while in a flat bed without using bedrails?: None Help needed moving from lying on your back to sitting on the side of a flat bed without using bedrails?: None Help needed moving to and from a bed to a chair (including a wheelchair)?: A Little Help needed standing up from a chair using your arms (e.g., wheelchair or bedside chair)?: A Little Help needed to walk in hospital room?: A Little Help needed climbing 3-5 steps with a railing? : A Lot 6 Click Score: 19    End of Session Equipment Utilized During Treatment: Gait belt Activity Tolerance: No increased pain;Patient tolerated treatment well Patient left: in bed;with call bell/phone within reach Nurse Communication: Mobility status PT Visit Diagnosis: Unsteadiness on feet (R26.81);Other abnormalities of gait and mobility (R26.89);History of falling (Z91.81);Difficulty in walking, not elsewhere classified (R26.2);Pain (fall 2023 with L ankle fx pt indicates no recent falls) Pain - Right/Left: Right Pain - part of body: Leg (LBP)     Time: 0981-1914 PT Time Calculation (min) (ACUTE ONLY): 11 min  Charges:  $Gait Training: 8-22  mins                      Tori Leocadia Idleman PT, DPT 10/12/22, 11:26 AM

## 2022-10-12 NOTE — Inpatient Diabetes Management (Addendum)
Inpatient Diabetes Program Recommendations  AACE/ADA: New Consensus Statement on Inpatient Glycemic Control (2015)  Target Ranges:  Prepandial:   less than 140 mg/dL      Peak postprandial:   less than 180 mg/dL (1-2 hours)      Critically ill patients:  140 - 180 mg/dL   Lab Results  Component Value Date   GLUCAP 242 (H) 10/12/2022   HGBA1C 9.8 (H) 10/08/2022    Review of Glycemic Control  Latest Reference Range & Units 10/10/22 07:53 10/10/22 11:29 10/10/22 16:13 10/11/22 07:53 10/11/22 11:08 10/11/22 16:47 10/11/22 21:22 10/12/22 08:01  Glucose-Capillary 70 - 99 mg/dL 811 (H) 914 (H) 782 (H) 227 (H) 290 (H) 306 (H) 271 (H) 242 (H)   Diabetes history: DM 2 Outpatient Diabetes medications:  Trulicity 1.5 mg weekly Toujeo 20 units q HS Humalog 5 units tid with meals  Current orders for Inpatient glycemic control:  Novolog 0-15 units tid with meals  Semglee 14 units daily  Inpatient Diabetes Program Recommendations:    Consider increasing Semglee to 20 units daily.  Also consider adding Novolog 4 units tid with meals (hold if patient eats less than 50% or NPO).    Addendum 1- Spoke with patient at bedside.  She states that blood sugars have been increased after meals for a while.  She is no longer taking rapid acting insulin with meals at home.  She wears a CGM at home. Discussed that she likely needs insulin with meals as previously ordered.  Alerted MD as well.  Humalog 5 units tid with meals is ordered on discharge medications.  Needs to f/u with PCP ASAP as well. Patient appreciative of visit.    Thanks,  Beryl Meager, RN, BC-ADM Inpatient Diabetes Coordinator Pager [phone]  (8a-5p)

## 2022-10-14 ENCOUNTER — Telehealth: Payer: Self-pay | Admitting: Hematology

## 2022-10-14 ENCOUNTER — Other Ambulatory Visit: Payer: Self-pay

## 2022-10-14 NOTE — Telephone Encounter (Signed)
Scheduled appointment per incoming call. Patient is aware of the made appointments.

## 2022-10-16 LAB — TYPE AND SCREEN
ABO/RH(D): B POS
Unit division: 0

## 2022-10-16 LAB — BPAM RBC
Blood Product Expiration Date: 202406212359
Unit Type and Rh: 7300

## 2022-10-19 ENCOUNTER — Other Ambulatory Visit: Payer: Self-pay

## 2022-10-19 DIAGNOSIS — N6315 Unspecified lump in the right breast, overlapping quadrants: Secondary | ICD-10-CM

## 2022-10-19 DIAGNOSIS — D500 Iron deficiency anemia secondary to blood loss (chronic): Secondary | ICD-10-CM

## 2022-10-20 ENCOUNTER — Other Ambulatory Visit: Payer: Self-pay

## 2022-10-20 ENCOUNTER — Inpatient Hospital Stay: Payer: 59

## 2022-10-20 ENCOUNTER — Inpatient Hospital Stay: Payer: 59 | Attending: Hematology

## 2022-10-20 ENCOUNTER — Encounter: Payer: Self-pay | Admitting: Hematology

## 2022-10-20 ENCOUNTER — Inpatient Hospital Stay (HOSPITAL_BASED_OUTPATIENT_CLINIC_OR_DEPARTMENT_OTHER): Payer: 59 | Admitting: Hematology

## 2022-10-20 VITALS — BP 141/52 | HR 62 | Temp 98.9°F | Resp 15 | Ht 65.0 in | Wt 170.2 lb

## 2022-10-20 VITALS — BP 124/55 | HR 66 | Temp 99.0°F | Resp 18

## 2022-10-20 DIAGNOSIS — D500 Iron deficiency anemia secondary to blood loss (chronic): Secondary | ICD-10-CM

## 2022-10-20 DIAGNOSIS — R918 Other nonspecific abnormal finding of lung field: Secondary | ICD-10-CM | POA: Diagnosis not present

## 2022-10-20 DIAGNOSIS — N6315 Unspecified lump in the right breast, overlapping quadrants: Secondary | ICD-10-CM

## 2022-10-20 DIAGNOSIS — Q2733 Arteriovenous malformation of digestive system vessel: Secondary | ICD-10-CM | POA: Insufficient documentation

## 2022-10-20 DIAGNOSIS — K5520 Angiodysplasia of colon without hemorrhage: Secondary | ICD-10-CM

## 2022-10-20 LAB — CBC WITH DIFFERENTIAL (CANCER CENTER ONLY)
Abs Immature Granulocytes: 0.07 10*3/uL (ref 0.00–0.07)
Basophils Absolute: 0 10*3/uL (ref 0.0–0.1)
Basophils Relative: 0 %
Eosinophils Absolute: 0.1 10*3/uL (ref 0.0–0.5)
Eosinophils Relative: 1 %
HCT: 27.2 % — ABNORMAL LOW (ref 36.0–46.0)
Hemoglobin: 7.8 g/dL — ABNORMAL LOW (ref 12.0–15.0)
Immature Granulocytes: 1 %
Lymphocytes Relative: 10 %
Lymphs Abs: 0.8 10*3/uL (ref 0.7–4.0)
MCH: 23.8 pg — ABNORMAL LOW (ref 26.0–34.0)
MCHC: 28.7 g/dL — ABNORMAL LOW (ref 30.0–36.0)
MCV: 82.9 fL (ref 80.0–100.0)
Monocytes Absolute: 0.5 10*3/uL (ref 0.1–1.0)
Monocytes Relative: 6 %
Neutro Abs: 6.7 10*3/uL (ref 1.7–7.7)
Neutrophils Relative %: 82 %
Platelet Count: 210 10*3/uL (ref 150–400)
RBC: 3.28 MIL/uL — ABNORMAL LOW (ref 3.87–5.11)
RDW: 24.4 % — ABNORMAL HIGH (ref 11.5–15.5)
WBC Count: 8.2 10*3/uL (ref 4.0–10.5)
nRBC: 0.9 % — ABNORMAL HIGH (ref 0.0–0.2)

## 2022-10-20 LAB — CMP (CANCER CENTER ONLY)
ALT: 62 U/L — ABNORMAL HIGH (ref 0–44)
AST: 34 U/L (ref 15–41)
Albumin: 3.7 g/dL (ref 3.5–5.0)
Alkaline Phosphatase: 269 U/L — ABNORMAL HIGH (ref 38–126)
Anion gap: 5 (ref 5–15)
BUN: 19 mg/dL (ref 8–23)
CO2: 27 mmol/L (ref 22–32)
Calcium: 9.9 mg/dL (ref 8.9–10.3)
Chloride: 101 mmol/L (ref 98–111)
Creatinine: 1.49 mg/dL — ABNORMAL HIGH (ref 0.44–1.00)
GFR, Estimated: 37 mL/min — ABNORMAL LOW (ref >60)
Glucose, Bld: 389 mg/dL — ABNORMAL HIGH (ref 70–99)
Potassium: 5.2 mmol/L — ABNORMAL HIGH (ref 3.5–5.1)
Sodium: 133 mmol/L — ABNORMAL LOW (ref 135–145)
Total Bilirubin: 0.3 mg/dL (ref 0.3–1.2)
Total Protein: 6.4 g/dL — ABNORMAL LOW (ref 6.5–8.1)

## 2022-10-20 MED ORDER — SODIUM CHLORIDE 0.9% FLUSH
10.0000 mL | Freq: Once | INTRAVENOUS | Status: AC
  Administered 2022-10-20: 10 mL

## 2022-10-20 MED ORDER — HEPARIN SOD (PORK) LOCK FLUSH 100 UNIT/ML IV SOLN
500.0000 [IU] | Freq: Once | INTRAVENOUS | Status: AC
  Administered 2022-10-20: 500 [IU]

## 2022-10-20 MED ORDER — SODIUM CHLORIDE 0.9 % IV SOLN: INTRAVENOUS | Status: DC

## 2022-10-20 MED ORDER — SODIUM CHLORIDE 0.9 % IV SOLN
250.0000 mg | INTRAVENOUS | Status: DC
  Administered 2022-10-20: 250 mg via INTRAVENOUS
  Filled 2022-10-20: qty 20

## 2022-10-20 NOTE — Assessment & Plan Note (Signed)
--  Pt has intestinal AVM history with associated chronic GI blood loss resulting in iron deficiency and blood loss anemia. She has required blood transfusions in the past in 2013 and in 2018 and frequent hospital admission for anemia. She does not meet the diagnosis criteria for hereditary hemorrhagic telangiectasia. -Her last colonoscopy/endoscopy was 02/2019, recall 2025.  -She is not on oral iron, due to the lack of benefit.  -Currently being treated with IV Iron to prevent significant anemia with goal of Ferritin 100-200 range 

## 2022-10-20 NOTE — Assessment & Plan Note (Signed)
h/o smoking intermittently for 40 years, then increased to daily after her mother passed in 2021.  -nodules initially seen on screening chest CT 07/12/19.  -Repeat 07/13/20 showed new nodules. PET scan on 07/28/20 showed mild hypermetabolism to dominant LUL nodule. Biopsy of left lung was attempted but held given difficult location. -she is now followed by Dr. Delton Coombes. Most recent CT 05/2022 showed stable lung nodules  -She continues to smoke. I again encouraged her to quit smoking.

## 2022-10-20 NOTE — Progress Notes (Signed)
Pt declined 30 min post iron observation. VSS stable at discharge

## 2022-10-20 NOTE — Patient Instructions (Addendum)
Rehydration, Older Adult  Rehydration is the replacement of fluids, salts, and minerals in the body (electrolytes) that are lost during dehydration. Dehydration is when there is not enough water or other fluids in the body. This happens when you lose more fluids than you take in. People who are age 73 or older have a higher risk of dehydration than younger adults. This is because in older age, the body: Is less able to maintain the right amount of water. Does not respond to temperature changes as well. Does not get a sense of thirst as easily or quickly. Other causes include: Not drinking enough fluids. This can occur when you are ill, when you forget to drink, or when you are doing activities that require a lot of energy, especially in hot weather. Conditions that cause loss of water or other fluids. These include diarrhea, vomiting, sweating, or urinating a lot. Other illnesses, such as fever or infection. Certain medicines, such as those that remove excess fluid from the body (diuretics). Symptoms of mild or moderate dehydration may include thirst, dry lips and mouth, and dizziness. Symptoms of severe dehydration may include increased heart rate, confusion, fainting, and not urinating. In severe cases, you may need to get fluids through an IV at the hospital. For mild or moderate cases, you can usually rehydrate at home by drinking certain fluids as told by your health care provider. What are the risks? Rehydration is usually safe. Taking in too much fluid (overhydration) can be a problem but is rare. Overhydration can cause an imbalance of electrolytes in the body, kidney failure, fluid in the lungs, or a decrease in salt (sodium) levels in the body. Supplies needed: You will need an oral rehydration solution (ORS) if your health care provider tells you to use one. This is a drink to treat dehydration. It can be found in pharmacies and retail stores. How to rehydrate Fluids Follow  instructions from your health care provider about what to drink. The kind of fluid and the amount you should drink depend on your condition. In general, you should choose drinks that you prefer. If told by your health care provider, drink an ORS. Make an ORS by following instructions on the package. Start by drinking small amounts, about  cup (120 mL) every 5-10 minutes. Slowly increase how much you drink until you have taken in the amount recommended by your health care provider. Drink enough clear fluids to keep your urine pale yellow. If you were told to drink an ORS, finish it first, then start slowly drinking other clear fluids. Drink fluids such as: Water. This includes sparkling and flavored water. Drinking only water can lead to having too little sodium in your body (hyponatremia). Follow the advice of your health care provider. Water from ice chips you suck on. Fruit juice with water added to it(diluted). Sports drinks. Hot or cold herbal teas. Broth-based soups. Coffee. Milk or milk products. Food Follow instructions from your health care provider about what to eat while you rehydrate. Your health care provider may recommend that you slowly begin eating regular foods in small amounts. Eat foods that contain a healthy balance of electrolytes, such as bananas, oranges, potatoes, tomatoes, and spinach. Avoid foods that are greasy or contain a lot of sugar. In some cases, you may get nutrition through a feeding tube that is passed through your nose and into your stomach (nasogastric tube, or NG tube). This may be done if you have uncontrolled vomiting or diarrhea. Drinks to avoid  Certain drinks may make dehydration worse. While you rehydrate, avoid drinking alcohol. How to tell if you are recovering from dehydration You may be getting better if: You are urinating more often than before you started rehydrating. Your urine is pale yellow. Your energy level improves. You vomit less  often. You have diarrhea less often. Your appetite improves or returns to normal. You feel less dizzy or light-headed. Your skin tone and color start to look more normal. Follow these instructions at home: Take over-the-counter and prescription medicines only as told by your health care provider. Do not take sodium tablets. Doing this can lead to having too much sodium in your body (hypernatremia). Contact a health care provider if: You continue to have symptoms of mild or moderate dehydration, such as: Thirst. Dry lips. Slightly dry mouth. Dizziness. Dark urine or less urine than usual. Muscle cramps. You continue to vomit or have diarrhea. Get help right away if: You have symptoms of dehydration that get worse. You have a fever. You have a severe headache. You have been vomiting and have problems, such as: Your vomiting gets worse. Your vomit includes blood or green matter (bile). You cannot eat or drink without vomiting. You have problems with urination or bowel movements, such as: Diarrhea that gets worse. Blood in your stool (feces). This may cause stool to look black and tarry. Not urinating, or urinating only a small amount of very dark urine, within 6-8 hours. You have trouble breathing. You have symptoms that get worse with treatment. These symptoms may be an emergency. Get help right away. Call 911. Do not wait to see if the symptoms will go away. Do not drive yourself to the hospital. This information is not intended to replace advice given to you by your health care provider. Make sure you discuss any questions you have with your health care provider. Document Revised: 09/15/2021 Document Reviewed: 09/13/2021 Elsevier Patient Education  2024 Else[employer]   Sodium Ferric Gluconate Complex Injection What is this medication? SODIUM FERRIC GLUCONATE COMPLEX (SOE dee um FER ik GLOO koe nate KOM pleks) treats low levels of iron (iron deficiency anemia) in people with  kidney disease. Iron is a mineral that plays an important role in making red blood cells, which carry oxygen from your lungs to the rest of your body. This medicine may be used for other purposes; ask your health care provider or pharmacist if you have questions. COMMON BRAND NAME(S): Ferrlecit, Nulecit What should I tell my care team before I take this medication? They need to know if you have any of the following conditions: Anemia that is not from iron deficiency High levels of iron in the blood An unusual or allergic reaction to iron, other medications, foods, dyes, or preservatives Pregnant or are trying to become pregnant Breast-feeding How should I use this medication? This medication is injected into a vein. It is given by your care team in a hospital or clinic setting. Talk to your care team about the use of this medication in children. While it may be prescribed for children as young as 6 years for selected conditions, precautions do apply. Overdosage: If you think you have taken too much of this medicine contact a poison control center or emergency room at once. NOTE: This medicine is only for you. Do not share this medicine with others. What if I miss a dose? It is important not to miss your dose. Call your care team if you are unable to keep an appointment.  What may interact with this medication? Do not take this medication with any of the following: Deferasirox Deferoxamine Dimercaprol This medication may also interact with the following: Other iron products This list may not describe all possible interactions. Give your health care provider a list of all the medicines, herbs, non-prescription drugs, or dietary supplements you use. Also tell them if you smoke, drink alcohol, or use illegal drugs. Some items may interact with your medicine. What should I watch for while using this medication? Your condition will be monitored carefully while you are receiving this  medication. Visit your care team for regular checks on your progress. You may need blood work while you are taking this medication. What side effects may I notice from receiving this medication? Side effects that you should report to your care team as soon as possible: Allergic reactions--skin rash, itching, hives, swelling of the face, lips, tongue, or throat Low blood pressure--dizziness, feeling faint or lightheaded, blurry vision Shortness of breath Side effects that usually do not require medical attention (report to your care team if they continue or are bothersome): Flushing Headache Joint pain Muscle pain Nausea Pain, redness, or irritation at injection site This list may not describe all possible side effects. Call your doctor for medical advice about side effects. You may report side effects to FDA at 1-800-FDA-1088. Where should I keep my medication? This medication is given in a hospital or clinic and will not be stored at home. NOTE: This sheet is a summary. It may not cover all possible information. If you have questions about this medicine, talk to your doctor, pharmacist, or health care provider.  2024 Elsevier/Gold Standard (2020-09-25 00:00:00)

## 2022-10-20 NOTE — Progress Notes (Signed)
[facility]   Telephone:(336) [phone] Fax:(336) [phone]   Clinic Follow up Note   Patient Care Team: [name], NP as PCP - General (Nurse Practitioner) Flora Lipps, Ronnald Ramp, MD as PCP - Cardiology (Cardiology) Malachy Mood, MD as Consulting Physician (Hematology) Chuck Hint, MD as Consulting Physician (Vascular Surgery) Eleanora Neighbor, MD as Referring Physician (Gastroenterology)  Date of Service:  10/20/2022  CHIEF COMPLAINT: f/u of anemia   CURRENT THERAPY:  IV  iron as needed (if ferritin <100)   ASSESSMENT:  Bethany [name] is a 72 y.o. female with   Iron deficiency anemia secondary to blood loss (chronic) -Pt has intestinal AVM history with associated chronic GI blood loss resulting in iron deficiency and blood loss anemia. She has required blood transfusions in the past in 2013 and in 2018 and frequent hospital admission for anemia. She does not meet the diagnosis criteria for hereditary hemorrhagic telangiectasia. -Her last colonoscopy/endoscopy was 02/2019, recall 2025.  -She is not on oral iron, due to the lack of benefit.  -Currently being treated with IV Iron to prevent significant anemia with goal of Ferritin 100-200 range    Multiple lung nodules h/o smoking intermittently for 40 years, then increased to daily after her mother passed in 2021.  -nodules initially seen on screening chest CT 07/12/19.  -Repeat 07/13/20 showed new nodules. PET scan on 07/28/20 showed mild hypermetabolism to dominant LUL nodule. Biopsy of left lung was attempted but held given difficult location. -she is now followed by Dr. Delton Coombes. Most recent CT 05/2022 showed stable lung nodules  -She continues to smoke. I again encouraged her to quit smoking.   Recent hospital admission -I have reviewed her recent hospital admission course -Her AKI resolved after hydration in the hospital, slightly worse creatinine again today, potassium slightly elevated at 5.2 -Will give IV  fluids today -Low potassium diet, will hold on medication for her slightly high potassium for now -She has been following up with her PCP   PLAN: -Lab reviewed, will proceed ferric gluconate 250 mg infusion today, and 1 more dose next week -Follow-up CBC closely, in 1 week, then every 2 weeks -Follow-up in 2 months.  INTERVAL HISTORY:  Bethany [name] is here for a follow up of anemia. She was last seen by me on 08/31/2022. She presents to the clinic alone.  She was recently admitted to hospital on Oct 08, 2022, for altered mental status, and AKI.  She was feeling drowsy and falling asleep during the day for a few weeks before the hospital admission, and was seen by PCP.  It was felt to be related to her medications, especially narcotics.  Is only on low-dose oxycodone now.  She is overall feeling better.  She was found to be quite anemic after hospital admission, no overt GI bleeding, she received 1 dose of Feraheme in the hospital last week.     All other systems were reviewed with the patient and are negative.  MEDICAL HISTORY:  Past Medical History:  Diagnosis Date   Acute renal failure (HCC)    Anemia    Arthritis    COPD (chronic obstructive pulmonary disease) (HCC)    Diabetes mellitus without complication (HCC)    type II    GERD (gastroesophageal reflux disease)    Hypercalcemia    Hyperlipidemia    Hypertension    Pneumonia    Renal disorder    Single kidney    Stroke [facility])    July 2021  Thrombosis    Tobacco abuse     SURGICAL HISTORY: Past Surgical History:  Procedure Laterality Date   ABDOMINAL HYSTERECTOMY     blood clots removed from descending aorta      BREAST BIOPSY Right 07/28/2021   BREAST BIOPSY Right 08/17/2021   BREAST LUMPECTOMY Right 01/25/2021   BREAST LUMPECTOMY WITH RADIOACTIVE SEED LOCALIZATION Right 01/25/2021   Procedure: RIGHT BREAST LUMPECTOMY WITH RADIOACTIVE SEED LOCALIZATION;  Surgeon: Abigail Miyamoto, MD;  Location: [facility];  Service: General;  Laterality: Right;   CHOLECYSTECTOMY     COLONOSCOPY     COLONOSCOPY WITH PROPOFOL N/A 04/17/2017   Procedure: COLONOSCOPY WITH PROPOFOL;  Surgeon: Charlott Rakes, MD;  Location: [facility];  Service: Endoscopy;  Laterality: N/A;   ESOPHAGOGASTRODUODENOSCOPY (EGD) WITH PROPOFOL N/A 04/17/2017   Procedure: ESOPHAGOGASTRODUODENOSCOPY (EGD) WITH PROPOFOL;  Surgeon: Charlott Rakes, MD;  Location: [facility];  Service: Endoscopy;  Laterality: N/A;   IR 3D INDEPENDENT WKST  05/04/2020   IR ANGIO INTRA EXTRACRAN SEL COM CAROTID INNOMINATE BILAT MOD SED  02/06/2020   IR ANGIO INTRA EXTRACRAN SEL INTERNAL CAROTID UNI L MOD SED  05/04/2020   IR ANGIO VERTEBRAL SEL VERTEBRAL BILAT MOD SED  02/06/2020   IR ANGIOGRAM FOLLOW UP STUDY  05/04/2020   IR IMAGING GUIDED PORT INSERTION  05/01/2019   IR NEURO EACH ADD'L AFTER BASIC UNI LEFT (MS)  05/04/2020   IR RADIOLOGIST EVAL & MGMT  05/28/2020   IR TRANSCATH/EMBOLIZ  05/04/2020   IR US GUIDE VASC ACCESS RIGHT  02/06/2020   IR US GUIDE VASC ACCESS RIGHT  05/04/2020   LESION REMOVAL Left 01/25/2021   Procedure: ABNORMAL SKIN LESION REMOVAL LEFT ABDOMINAL WALL;  Surgeon: Abigail Miyamoto, MD;  Location: [facility];  Service: General;  Laterality: Left;   NEPHRECTOMY RECIPIENT     ORIF ANKLE FRACTURE N/A 07/13/2021   Procedure: OPEN REDUCTION INTERNAL FIXATION (ORIF) ANKLE FRACTURE;  Surgeon: Ernestina Columbia, MD;  Location: [facility];  Service: Orthopedics;  Laterality: N/A;   RADIOLOGY WITH ANESTHESIA N/A 05/04/2020   Procedure: Vertis Kelch;  Surgeon: Julieanne Cotton, MD;  Location: [facility];  Service: Radiology;  Laterality: N/A;    I have reviewed the social history and family history with the patient and they are unchanged from previous note.  ALLERGIES:  is allergic to contrast media [iodinated contrast media] and other.  MEDICATIONS:  Current Outpatient Medications  Medication Sig Dispense  Refill   allopurinol (ZYLOPRIM) 100 MG tablet Take 100 mg by mouth at bedtime.     amLODipine (NORVASC) 10 MG tablet Take 10 mg by mouth daily.     atorvastatin (LIPITOR) 40 MG tablet Take 40 mg by mouth daily.     Dulaglutide (TRULICITY) 1.5 MG/0.5ML SOPN Inject 1.5 mg into the skin every Monday.      ezetimibe (ZETIA) 10 MG tablet Take 10 mg by mouth daily.     famotidine (PEPCID) 20 MG tablet Take 20 mg by mouth 2 (two) times daily.     feeding supplement, ENSURE ENLIVE, (ENSURE ENLIVE) LIQD Take 237 mLs by mouth 2 (two) times daily between meals. 237 mL 12   furosemide (LASIX) 40 MG tablet Take 40 mg by mouth daily as needed for fluid or edema.     gabapentin (NEURONTIN) 100 MG capsule Take 2 capsules (200 mg total) by mouth 3 (three) times daily.     insulin glargine, 1 Unit Dial, (TOUJEO SOLOSTAR) 300 UNIT/ML Solostar Pen Inject 20 Units into the  skin at bedtime.      insulin lispro (HUMALOG) 100 UNIT/ML KwikPen Inject 5 Units into the skin 3 (three) times daily as needed (blood sugar >200).     Ipratropium-Albuterol (COMBIVENT) 20-100 MCG/ACT AERS respimat Inhale 1 puff into the lungs every 6 (six) hours as needed for wheezing or shortness of breath. 1 Inhaler 0   levothyroxine (SYNTHROID) 75 MCG tablet Take 75 mcg by mouth daily.     lidocaine-prilocaine (EMLA) cream APPLY 2-5 cms of cream TO port site 45 MINUTES - ONE HOUR prior TO access 30 g 1   losartan (COZAAR) 25 MG tablet Take 1 tablet (25 mg total) by mouth daily. 30 tablet 11   metoprolol (LOPRESSOR) 50 MG tablet Take 50 mg by mouth 2 (two) times daily.      NARCAN 4 MG/0.1ML LIQD nasal spray kit Place 1 spray into the nose as needed for opioid reversal.     Omega-3 Fatty Acids (FISH OIL PO) Take 1 capsule by mouth 2 (two) times daily.     oxyCODONE (OXY IR/ROXICODONE) 5 MG immediate release tablet Take 1 tablet (5 mg total) by mouth every 6 (six) hours as needed for severe pain or breakthrough pain. 20 tablet 0   pantoprazole  (PROTONIX) 40 MG tablet Take 1 tablet (40 mg total) by mouth 2 (two) times daily before a meal. 60 tablet 0   polyethylene glycol powder (MIRALAX) 17 GM/SCOOP powder Take 17 g by mouth 2 (two) times daily as needed for moderate constipation. 255 g 0   senna-docusate (SENOKOT-S) 8.6-50 MG tablet Take 1 tablet by mouth 2 (two) times daily between meals as needed for mild constipation. 60 tablet 0   Tetrahydrozoline HCl (VISINE OP) Place 1 drop into both eyes 3 (three) times daily as needed (dry eyes).     No current facility-administered medications for this visit.   Facility-Administered Medications Ordered in Other Visits  Medication Dose Route Frequency Provider Last Rate Last Admin   0.9 %  sodium chloride infusion   Intravenous Continuous Malachy Mood, MD   Stopped at 10/20/22 1604   0.9 %  sodium chloride infusion   Intravenous Continuous Malachy Mood, MD   Stopped at 10/20/22 1609   ferric gluconate (FERRLECIT) 250 mg in sodium chloride 0.9 % 250 mL IVPB  250 mg Intravenous Weekly Artis Delay, MD   Stopped at 10/20/22 1558    PHYSICAL EXAMINATION: ECOG PERFORMANCE STATUS: 2 - Symptomatic, <50% confined to bed  Vitals:   10/20/22 1248  BP: (!) 141/52  Pulse: 62  Resp: 15  Temp: 98.9 F (37.2 C)  SpO2: 100%   Wt Readings from Last 3 Encounters:  10/20/22 170 lb 3.2 oz (77.2 kg)  10/10/22 170 lb 10.2 oz (77.4 kg)  08/31/22 169 lb 3.2 oz (76.7 kg)     GENERAL:alert, no distress and comfortable SKIN: skin color, texture, turgor are normal, no rashes or significant lesions EYES: normal, Conjunctiva are pink and non-injected, sclera clear NECK: supple, thyroid normal size, non-tender, without nodularity LYMPH:  no palpable lymphadenopathy in the cervical, axillary  LUNGS: clear to auscultation and percussion with normal breathing effort HEART: regular rate & rhythm and no murmurs and no lower extremity edema ABDOMEN:abdomen soft, non-tender and normal bowel sounds Musculoskeletal:no  cyanosis of digits and no clubbing  NEURO: alert & oriented x 3 with fluent speech, no focal motor/sensory deficits  LABORATORY DATA:  I have reviewed the data as listed    Latest Ref Rng & Units  10/20/2022   12:17 PM 10/12/2022   10:25 AM 10/12/2022    5:20 AM  CBC  WBC 4.0 - 10.5 K/uL 8.2   6.7   Hemoglobin 12.0 - 15.0 g/dL 7.8  7.5  5.3   Hematocrit 36.0 - 46.0 % 27.2  26.5  18.7   Platelets 150 - 400 K/uL 210   179         Latest Ref Rng & Units 10/20/2022   12:17 PM 10/12/2022    5:20 AM 10/10/2022    9:11 AM  CMP  Glucose 70 - 99 mg/dL 161  096  045   BUN 8 - 23 mg/dL 19  16  19    Creatinine 0.44 - 1.00 mg/dL 4.09  8.11  9.14   Sodium 135 - 145 mmol/L 133  137  138   Potassium 3.5 - 5.1 mmol/L 5.2  3.3  3.7   Chloride 98 - 111 mmol/L 101  114  107   CO2 22 - 32 mmol/L 27  20  26    Calcium 8.9 - 10.3 mg/dL 9.9  7.4  9.7   Total Protein 6.5 - 8.1 g/dL 6.4  4.3  6.6   Total Bilirubin 0.3 - 1.2 mg/dL 0.3  0.2  0.4   Alkaline Phos 38 - 126 U/L 269  76  122   AST 15 - 41 U/L 34  14  17   ALT 0 - 44 U/L 62  15  19       RADIOGRAPHIC STUDIES: I have personally reviewed the radiological images as listed and agreed with the findings in the report. No results found.    No orders of the defined types were placed in this encounter.  All questions were answered. The patient knows to call the clinic with any problems, questions or concerns. No barriers to learning was detected. The total time spent in the appointment was 30 minutes.     Malachy Mood, MD 10/20/2022   Carolin Coy, CMA, am acting as scribe for Malachy Mood, MD.   I have reviewed the above documentation for accuracy and completeness, and I agree with the above.

## 2022-10-21 ENCOUNTER — Telehealth: Payer: Self-pay | Admitting: Hematology

## 2022-10-21 NOTE — Telephone Encounter (Signed)
Contacted patient to scheduled appointments. Patient is aware of appointments that are scheduled.   

## 2022-10-26 ENCOUNTER — Other Ambulatory Visit: Payer: Self-pay

## 2022-10-26 ENCOUNTER — Other Ambulatory Visit: Payer: Self-pay | Admitting: Hematology

## 2022-10-26 ENCOUNTER — Inpatient Hospital Stay: Payer: 59

## 2022-10-26 VITALS — BP 126/47 | HR 63 | Temp 98.5°F | Resp 18

## 2022-10-26 DIAGNOSIS — N6315 Unspecified lump in the right breast, overlapping quadrants: Secondary | ICD-10-CM

## 2022-10-26 DIAGNOSIS — K5520 Angiodysplasia of colon without hemorrhage: Secondary | ICD-10-CM

## 2022-10-26 DIAGNOSIS — Q2733 Arteriovenous malformation of digestive system vessel: Secondary | ICD-10-CM | POA: Diagnosis not present

## 2022-10-26 DIAGNOSIS — D500 Iron deficiency anemia secondary to blood loss (chronic): Secondary | ICD-10-CM

## 2022-10-26 LAB — CBC WITH DIFFERENTIAL (CANCER CENTER ONLY)
Abs Immature Granulocytes: 0.05 10*3/uL (ref 0.00–0.07)
Basophils Absolute: 0 10*3/uL (ref 0.0–0.1)
Basophils Relative: 0 %
Eosinophils Absolute: 0.1 10*3/uL (ref 0.0–0.5)
Eosinophils Relative: 1 %
HCT: 30.3 % — ABNORMAL LOW (ref 36.0–46.0)
Hemoglobin: 8.8 g/dL — ABNORMAL LOW (ref 12.0–15.0)
Immature Granulocytes: 1 %
Lymphocytes Relative: 12 %
Lymphs Abs: 0.8 10*3/uL (ref 0.7–4.0)
MCH: 24.2 pg — ABNORMAL LOW (ref 26.0–34.0)
MCHC: 29 g/dL — ABNORMAL LOW (ref 30.0–36.0)
MCV: 83.2 fL (ref 80.0–100.0)
Monocytes Absolute: 0.5 10*3/uL (ref 0.1–1.0)
Monocytes Relative: 7 %
Neutro Abs: 5.3 10*3/uL (ref 1.7–7.7)
Neutrophils Relative %: 79 %
Platelet Count: 309 10*3/uL (ref 150–400)
RBC: 3.64 MIL/uL — ABNORMAL LOW (ref 3.87–5.11)
RDW: 22.9 % — ABNORMAL HIGH (ref 11.5–15.5)
WBC Count: 6.8 10*3/uL (ref 4.0–10.5)
nRBC: 0.6 % — ABNORMAL HIGH (ref 0.0–0.2)

## 2022-10-26 LAB — CMP (CANCER CENTER ONLY)
ALT: 20 U/L (ref 0–44)
AST: 14 U/L — ABNORMAL LOW (ref 15–41)
Albumin: 3.6 g/dL (ref 3.5–5.0)
Alkaline Phosphatase: 155 U/L — ABNORMAL HIGH (ref 38–126)
Anion gap: 7 (ref 5–15)
BUN: 19 mg/dL (ref 8–23)
CO2: 26 mmol/L (ref 22–32)
Calcium: 10 mg/dL (ref 8.9–10.3)
Chloride: 101 mmol/L (ref 98–111)
Creatinine: 1.16 mg/dL — ABNORMAL HIGH (ref 0.44–1.00)
GFR, Estimated: 50 mL/min — ABNORMAL LOW (ref >60)
Glucose, Bld: 358 mg/dL — ABNORMAL HIGH (ref 70–99)
Potassium: 4.5 mmol/L (ref 3.5–5.1)
Sodium: 134 mmol/L — ABNORMAL LOW (ref 135–145)
Total Bilirubin: 0.3 mg/dL (ref 0.3–1.2)
Total Protein: 6.5 g/dL (ref 6.5–8.1)

## 2022-10-26 LAB — FERRITIN: Ferritin: 124 ng/mL (ref 11–307)

## 2022-10-26 MED ORDER — LORATADINE 10 MG PO TABS
10.0000 mg | ORAL_TABLET | Freq: Once | ORAL | Status: AC
  Administered 2022-10-26: 10 mg via ORAL
  Filled 2022-10-26: qty 1

## 2022-10-26 MED ORDER — SODIUM CHLORIDE 0.9% FLUSH
10.0000 mL | Freq: Once | INTRAVENOUS | Status: AC
  Administered 2022-10-26: 10 mL

## 2022-10-26 MED ORDER — SODIUM CHLORIDE 0.9 % IV SOLN
250.0000 mg | INTRAVENOUS | Status: DC
  Administered 2022-10-26: 250 mg via INTRAVENOUS
  Filled 2022-10-26: qty 20

## 2022-10-26 MED ORDER — ACETAMINOPHEN 325 MG PO TABS
650.0000 mg | ORAL_TABLET | Freq: Once | ORAL | Status: AC
  Administered 2022-10-26: 650 mg via ORAL
  Filled 2022-10-26: qty 2

## 2022-10-26 MED ORDER — SODIUM CHLORIDE 0.9 % IV SOLN: Freq: Once | INTRAVENOUS | Status: AC

## 2022-10-26 NOTE — Progress Notes (Signed)
Pt declined 30 minute post observation following iron infusion. VSS at d/c.

## 2022-10-26 NOTE — Patient Instructions (Signed)

## 2022-11-10 ENCOUNTER — Inpatient Hospital Stay: Payer: 59

## 2022-11-10 ENCOUNTER — Telehealth: Payer: Self-pay | Admitting: Hematology

## 2022-11-10 DIAGNOSIS — K5520 Angiodysplasia of colon without hemorrhage: Secondary | ICD-10-CM

## 2022-11-10 DIAGNOSIS — N6315 Unspecified lump in the right breast, overlapping quadrants: Secondary | ICD-10-CM

## 2022-11-10 DIAGNOSIS — D500 Iron deficiency anemia secondary to blood loss (chronic): Secondary | ICD-10-CM

## 2022-11-10 DIAGNOSIS — Q2733 Arteriovenous malformation of digestive system vessel: Secondary | ICD-10-CM | POA: Diagnosis not present

## 2022-11-10 LAB — CBC WITH DIFFERENTIAL (CANCER CENTER ONLY)
Abs Immature Granulocytes: 0.05 10*3/uL (ref 0.00–0.07)
Basophils Absolute: 0 10*3/uL (ref 0.0–0.1)
Basophils Relative: 0 %
Eosinophils Absolute: 0.1 10*3/uL (ref 0.0–0.5)
Eosinophils Relative: 2 %
HCT: 33 % — ABNORMAL LOW (ref 36.0–46.0)
Hemoglobin: 9.7 g/dL — ABNORMAL LOW (ref 12.0–15.0)
Immature Granulocytes: 1 %
Lymphocytes Relative: 16 %
Lymphs Abs: 1.1 10*3/uL (ref 0.7–4.0)
MCH: 24.1 pg — ABNORMAL LOW (ref 26.0–34.0)
MCHC: 29.4 g/dL — ABNORMAL LOW (ref 30.0–36.0)
MCV: 81.9 fL (ref 80.0–100.0)
Monocytes Absolute: 0.5 10*3/uL (ref 0.1–1.0)
Monocytes Relative: 7 %
Neutro Abs: 5.2 10*3/uL (ref 1.7–7.7)
Neutrophils Relative %: 74 %
Platelet Count: 245 10*3/uL (ref 150–400)
RBC: 4.03 MIL/uL (ref 3.87–5.11)
RDW: 19.3 % — ABNORMAL HIGH (ref 11.5–15.5)
WBC Count: 6.9 10*3/uL (ref 4.0–10.5)
nRBC: 0 % (ref 0.0–0.2)

## 2022-11-10 LAB — CMP (CANCER CENTER ONLY)
ALT: 87 U/L — ABNORMAL HIGH (ref 0–44)
AST: 64 U/L — ABNORMAL HIGH (ref 15–41)
Albumin: 3.6 g/dL (ref 3.5–5.0)
Alkaline Phosphatase: 213 U/L — ABNORMAL HIGH (ref 38–126)
Anion gap: 3 — ABNORMAL LOW (ref 5–15)
BUN: 22 mg/dL (ref 8–23)
CO2: 28 mmol/L (ref 22–32)
Calcium: 9.8 mg/dL (ref 8.9–10.3)
Chloride: 105 mmol/L (ref 98–111)
Creatinine: 1.16 mg/dL — ABNORMAL HIGH (ref 0.44–1.00)
GFR, Estimated: 50 mL/min — ABNORMAL LOW (ref >60)
Glucose, Bld: 182 mg/dL — ABNORMAL HIGH (ref 70–99)
Potassium: 4.7 mmol/L (ref 3.5–5.1)
Sodium: 136 mmol/L (ref 135–145)
Total Bilirubin: 0.3 mg/dL (ref 0.3–1.2)
Total Protein: 6.5 g/dL (ref 6.5–8.1)

## 2022-11-10 MED ORDER — HEPARIN SOD (PORK) LOCK FLUSH 100 UNIT/ML IV SOLN
500.0000 [IU] | Freq: Once | INTRAVENOUS | Status: AC
  Administered 2022-11-10: 500 [IU]

## 2022-11-10 MED ORDER — SODIUM CHLORIDE 0.9% FLUSH
10.0000 mL | Freq: Once | INTRAVENOUS | Status: AC
  Administered 2022-11-10: 10 mL

## 2022-11-24 ENCOUNTER — Inpatient Hospital Stay: Payer: 59 | Attending: Hematology

## 2022-11-24 DIAGNOSIS — Z79899 Other long term (current) drug therapy: Secondary | ICD-10-CM | POA: Insufficient documentation

## 2022-11-24 DIAGNOSIS — R918 Other nonspecific abnormal finding of lung field: Secondary | ICD-10-CM | POA: Insufficient documentation

## 2022-11-24 DIAGNOSIS — F17200 Nicotine dependence, unspecified, uncomplicated: Secondary | ICD-10-CM | POA: Insufficient documentation

## 2022-11-24 DIAGNOSIS — D500 Iron deficiency anemia secondary to blood loss (chronic): Secondary | ICD-10-CM | POA: Insufficient documentation

## 2022-11-30 ENCOUNTER — Inpatient Hospital Stay: Payer: 59 | Admitting: Hematology

## 2022-11-30 ENCOUNTER — Inpatient Hospital Stay: Payer: 59

## 2022-12-06 NOTE — Progress Notes (Unsigned)
[facility]   Telephone:(336) [phone] Fax:(336) [phone]   Clinic Follow up Note   Patient Care Team: [name], NP as PCP - General (Nurse Practitioner) Flora Lipps, Ronnald Ramp, MD as PCP - Cardiology (Cardiology) Malachy Mood, MD as Consulting Physician (Hematology) Chuck Hint, MD as Consulting Physician (Vascular Surgery) Eleanora Neighbor, MD as Referring Physician (Gastroenterology)  Date of Service:  12/07/2022  CHIEF COMPLAINT: f/u of  anemia   CURRENT THERAPY:  Ferric Gluconate (Ferrlecit) IV q7d  ASSESSMENT:  Bethany [name] PATIENT is a 72 y.o. female with   Iron deficiency anemia secondary to blood loss (chronic) -Pt has intestinal AVM history with associated chronic GI blood loss resulting in iron deficiency and blood loss anemia. She has required blood transfusions in the past in 2013 and in 2018 and frequent hospital admission for anemia. She does not meet the diagnosis criteria for hereditary hemorrhagic telangiectasia. -Her last colonoscopy/endoscopy was 02/2019, recall 2025.  -She is not on oral iron, due to the lack of benefit.  -Currently being treated with IV Iron to prevent significant anemia with goal of Ferritin 100-200 range    Multiple lung nodules h/o smoking intermittently for 40 years, then increased to daily after her mother passed in 2021.  -nodules initially seen on screening chest CT 07/12/19.  -Repeat 07/13/20 showed new nodules. PET scan on 07/28/20 showed mild hypermetabolism to dominant LUL nodule. Biopsy of left lung was attempted but held given difficult location. -she is now followed by Dr. Delton Coombes. Most recent CT 05/2022 showed stable lung nodules  -She continues to smoke. I again encouraged her to quit smoking.  -She will follow-up with Dr. Delton Coombes with repeated CT scan in January 2025     PLAN: -lab reviewed -hg 10.0, improved  -CMP-pending -Iron study-pending -lab monthly  -lab/flush and f/u in 3 months   INTERVAL HISTORY:   Bethany [name] is here for a follow up of  anemia . She was last seen by me on 10/20/2022. She presents to the clinic alone. Pt state that she is doing well.     All other systems were reviewed with the patient and are negative.  MEDICAL HISTORY:  Past Medical History:  Diagnosis Date   Acute renal failure (HCC)    Anemia    Arthritis    COPD (chronic obstructive pulmonary disease) (HCC)    Diabetes mellitus without complication (HCC)    type II    GERD (gastroesophageal reflux disease)    Hypercalcemia    Hyperlipidemia    Hypertension    Pneumonia    Renal disorder    Single kidney    Stroke [facility])    July 2021   Thrombosis    Tobacco abuse     SURGICAL HISTORY: Past Surgical History:  Procedure Laterality Date   ABDOMINAL HYSTERECTOMY     blood clots removed from descending aorta      BREAST BIOPSY Right 07/28/2021   BREAST BIOPSY Right 08/17/2021   BREAST LUMPECTOMY Right 01/25/2021   BREAST LUMPECTOMY WITH RADIOACTIVE SEED LOCALIZATION Right 01/25/2021   Procedure: RIGHT BREAST LUMPECTOMY WITH RADIOACTIVE SEED LOCALIZATION;  Surgeon: Abigail Miyamoto, MD;  Location: [facility];  Service: General;  Laterality: Right;   CHOLECYSTECTOMY     COLONOSCOPY     COLONOSCOPY WITH PROPOFOL N/A 04/17/2017   Procedure: COLONOSCOPY WITH PROPOFOL;  Surgeon: Charlott Rakes, MD;  Location: [facility];  Service: Endoscopy;  Laterality: N/A;   ESOPHAGOGASTRODUODENOSCOPY (EGD) WITH PROPOFOL N/A 04/17/2017  Procedure: ESOPHAGOGASTRODUODENOSCOPY (EGD) WITH PROPOFOL;  Surgeon: Charlott Rakes, MD;  Location: [facility];  Service: Endoscopy;  Laterality: N/A;   IR 3D INDEPENDENT WKST  05/04/2020   IR ANGIO INTRA EXTRACRAN SEL COM CAROTID INNOMINATE BILAT MOD SED  02/06/2020   IR ANGIO INTRA EXTRACRAN SEL INTERNAL CAROTID UNI L MOD SED  05/04/2020   IR ANGIO VERTEBRAL SEL VERTEBRAL BILAT MOD SED  02/06/2020   IR ANGIOGRAM FOLLOW UP STUDY  05/04/2020   IR IMAGING  GUIDED PORT INSERTION  05/01/2019   IR NEURO EACH ADD'L AFTER BASIC UNI LEFT (MS)  05/04/2020   IR RADIOLOGIST EVAL & MGMT  05/28/2020   IR TRANSCATH/EMBOLIZ  05/04/2020   IR US GUIDE VASC ACCESS RIGHT  02/06/2020   IR US GUIDE VASC ACCESS RIGHT  05/04/2020   LESION REMOVAL Left 01/25/2021   Procedure: ABNORMAL SKIN LESION REMOVAL LEFT ABDOMINAL WALL;  Surgeon: Abigail Miyamoto, MD;  Location: [facility];  Service: General;  Laterality: Left;   NEPHRECTOMY RECIPIENT     ORIF ANKLE FRACTURE N/A 07/13/2021   Procedure: OPEN REDUCTION INTERNAL FIXATION (ORIF) ANKLE FRACTURE;  Surgeon: Ernestina Columbia, MD;  Location: [facility];  Service: Orthopedics;  Laterality: N/A;   RADIOLOGY WITH ANESTHESIA N/A 05/04/2020   Procedure: Vertis Kelch;  Surgeon: Julieanne Cotton, MD;  Location: [facility];  Service: Radiology;  Laterality: N/A;    I have reviewed the social history and family history with the patient and they are unchanged from previous note.  ALLERGIES:  is allergic to contrast media [iodinated contrast media] and other.  MEDICATIONS:  Current Outpatient Medications  Medication Sig Dispense Refill   allopurinol (ZYLOPRIM) 100 MG tablet Take 100 mg by mouth at bedtime.     amLODipine (NORVASC) 10 MG tablet Take 10 mg by mouth daily.     atorvastatin (LIPITOR) 40 MG tablet Take 40 mg by mouth daily.     Dulaglutide (TRULICITY) 1.5 MG/0.5ML SOPN Inject 1.5 mg into the skin every Monday.      ezetimibe (ZETIA) 10 MG tablet Take 10 mg by mouth daily.     famotidine (PEPCID) 20 MG tablet Take 20 mg by mouth 2 (two) times daily.     feeding supplement, ENSURE ENLIVE, (ENSURE ENLIVE) LIQD Take 237 mLs by mouth 2 (two) times daily between meals. 237 mL 12   furosemide (LASIX) 40 MG tablet Take 40 mg by mouth daily as needed for fluid or edema.     gabapentin (NEURONTIN) 100 MG capsule Take 2 capsules (200 mg total) by mouth 3 (three) times daily.     insulin glargine, 1 Unit Dial,  (TOUJEO SOLOSTAR) 300 UNIT/ML Solostar Pen Inject 20 Units into the skin at bedtime.      insulin lispro (HUMALOG) 100 UNIT/ML KwikPen Inject 5 Units into the skin 3 (three) times daily as needed (blood sugar >200).     Ipratropium-Albuterol (COMBIVENT) 20-100 MCG/ACT AERS respimat Inhale 1 puff into the lungs every 6 (six) hours as needed for wheezing or shortness of breath. 1 Inhaler 0   levothyroxine (SYNTHROID) 75 MCG tablet Take 75 mcg by mouth daily.     lidocaine-prilocaine (EMLA) cream APPLY 2-5 cms of cream TO port site 45 MINUTES - ONE HOUR prior TO access 30 g 1   losartan (COZAAR) 25 MG tablet Take 1 tablet (25 mg total) by mouth daily. 30 tablet 11   metoprolol (LOPRESSOR) 50 MG tablet Take 50 mg by mouth 2 (two) times daily.  NARCAN 4 MG/0.1ML LIQD nasal spray kit Place 1 spray into the nose as needed for opioid reversal.     Omega-3 Fatty Acids (FISH OIL PO) Take 1 capsule by mouth 2 (two) times daily.     oxyCODONE (OXY IR/ROXICODONE) 5 MG immediate release tablet Take 1 tablet (5 mg total) by mouth every 6 (six) hours as needed for severe pain or breakthrough pain. 20 tablet 0   pantoprazole (PROTONIX) 40 MG tablet Take 1 tablet (40 mg total) by mouth 2 (two) times daily before a meal. 60 tablet 0   polyethylene glycol powder (MIRALAX) 17 GM/SCOOP powder Take 17 g by mouth 2 (two) times daily as needed for moderate constipation. 255 g 0   senna-docusate (SENOKOT-S) 8.6-50 MG tablet Take 1 tablet by mouth 2 (two) times daily between meals as needed for mild constipation. 60 tablet 0   Tetrahydrozoline HCl (VISINE OP) Place 1 drop into both eyes 3 (three) times daily as needed (dry eyes).     No current facility-administered medications for this visit.    PHYSICAL EXAMINATION: ECOG PERFORMANCE STATUS: 1 - Symptomatic but completely ambulatory  Vitals:   12/07/22 1329  BP: (!) 134/59  Pulse: (!) 58  Resp: 19  Temp: 98 F (36.7 C)  SpO2: 100%   Wt Readings from Last 3  Encounters:  12/07/22 177 lb 1.6 oz (80.3 kg)  10/20/22 170 lb 3.2 oz (77.2 kg)  10/10/22 170 lb 10.2 oz (77.4 kg)     GENERAL:alert, no distress and comfortable SKIN: skin color normal, no rashes or significant lesions EYES: normal, Conjunctiva are pink and non-injected, sclera clear  NEURO: alert & oriented x 3 with fluent speech LABORATORY DATA:  I have reviewed the data as listed    Latest Ref Rng & Units 12/07/2022   12:51 PM 11/10/2022    3:44 PM 10/26/2022   12:22 PM  CBC  WBC 4.0 - 10.5 K/uL 6.6  6.9  6.8   Hemoglobin 12.0 - 15.0 g/dL 16.1  9.7  8.8   Hematocrit 36.0 - 46.0 % 33.4  33.0  30.3   Platelets 150 - 400 K/uL 230  245  309         Latest Ref Rng & Units 12/07/2022   12:51 PM 11/10/2022    3:44 PM 10/26/2022   12:22 PM  CMP  Glucose 70 - 99 mg/dL 096  045  409   BUN 8 - 23 mg/dL 22  22  19    Creatinine 0.44 - 1.00 mg/dL 8.11  9.14  7.82   Sodium 135 - 145 mmol/L 135  136  134   Potassium 3.5 - 5.1 mmol/L 4.6  4.7  4.5   Chloride 98 - 111 mmol/L 103  105  101   CO2 22 - 32 mmol/L 28  28  26    Calcium 8.9 - 10.3 mg/dL 95.6  9.8  21.3   Total Protein 6.5 - 8.1 g/dL 6.4  6.5  6.5   Total Bilirubin 0.3 - 1.2 mg/dL 0.2  0.3  0.3   Alkaline Phos 38 - 126 U/L 376  213  155   AST 15 - 41 U/L 90  64  14   ALT 0 - 44 U/L 119  87  20       RADIOGRAPHIC STUDIES: I have personally reviewed the radiological images as listed and agreed with the findings in the report. No results found.    No orders of the defined types were  placed in this encounter.  All questions were answered. The patient knows to call the clinic with any problems, questions or concerns. No barriers to learning was detected. The total time spent in the appointment was 20 minutes.     Malachy Mood, MD 12/07/2022   Carolin Coy, CMA, am acting as scribe for Malachy Mood, MD.   I have reviewed the above documentation for accuracy and completeness, and I agree with the above.

## 2022-12-07 ENCOUNTER — Encounter: Payer: Self-pay | Admitting: Hematology

## 2022-12-07 ENCOUNTER — Other Ambulatory Visit: Payer: Self-pay

## 2022-12-07 ENCOUNTER — Inpatient Hospital Stay: Payer: 59

## 2022-12-07 ENCOUNTER — Inpatient Hospital Stay: Payer: 59 | Admitting: Hematology

## 2022-12-07 VITALS — BP 134/59 | HR 58 | Temp 98.0°F | Resp 19 | Wt 177.1 lb

## 2022-12-07 DIAGNOSIS — R918 Other nonspecific abnormal finding of lung field: Secondary | ICD-10-CM | POA: Diagnosis not present

## 2022-12-07 DIAGNOSIS — K5520 Angiodysplasia of colon without hemorrhage: Secondary | ICD-10-CM

## 2022-12-07 DIAGNOSIS — Z79899 Other long term (current) drug therapy: Secondary | ICD-10-CM | POA: Diagnosis not present

## 2022-12-07 DIAGNOSIS — D500 Iron deficiency anemia secondary to blood loss (chronic): Secondary | ICD-10-CM

## 2022-12-07 DIAGNOSIS — F17200 Nicotine dependence, unspecified, uncomplicated: Secondary | ICD-10-CM | POA: Diagnosis not present

## 2022-12-07 DIAGNOSIS — N6315 Unspecified lump in the right breast, overlapping quadrants: Secondary | ICD-10-CM

## 2022-12-07 LAB — CBC WITH DIFFERENTIAL (CANCER CENTER ONLY)
Abs Immature Granulocytes: 0.03 10*3/uL (ref 0.00–0.07)
Basophils Absolute: 0 10*3/uL (ref 0.0–0.1)
Basophils Relative: 0 %
Eosinophils Absolute: 0.1 10*3/uL (ref 0.0–0.5)
Eosinophils Relative: 2 %
HCT: 33.4 % — ABNORMAL LOW (ref 36.0–46.0)
Hemoglobin: 10 g/dL — ABNORMAL LOW (ref 12.0–15.0)
Immature Granulocytes: 1 %
Lymphocytes Relative: 18 %
Lymphs Abs: 1.2 10*3/uL (ref 0.7–4.0)
MCH: 23.3 pg — ABNORMAL LOW (ref 26.0–34.0)
MCHC: 29.9 g/dL — ABNORMAL LOW (ref 30.0–36.0)
MCV: 77.9 fL — ABNORMAL LOW (ref 80.0–100.0)
Monocytes Absolute: 0.5 10*3/uL (ref 0.1–1.0)
Monocytes Relative: 8 %
Neutro Abs: 4.7 10*3/uL (ref 1.7–7.7)
Neutrophils Relative %: 71 %
Platelet Count: 230 10*3/uL (ref 150–400)
RBC: 4.29 MIL/uL (ref 3.87–5.11)
RDW: 17.1 % — ABNORMAL HIGH (ref 11.5–15.5)
WBC Count: 6.6 10*3/uL (ref 4.0–10.5)
nRBC: 0 % (ref 0.0–0.2)

## 2022-12-07 LAB — CMP (CANCER CENTER ONLY)
ALT: 119 U/L — ABNORMAL HIGH (ref 0–44)
AST: 90 U/L — ABNORMAL HIGH (ref 15–41)
Albumin: 4 g/dL (ref 3.5–5.0)
Alkaline Phosphatase: 376 U/L — ABNORMAL HIGH (ref 38–126)
Anion gap: 4 — ABNORMAL LOW (ref 5–15)
BUN: 22 mg/dL (ref 8–23)
CO2: 28 mmol/L (ref 22–32)
Calcium: 10.3 mg/dL (ref 8.9–10.3)
Chloride: 103 mmol/L (ref 98–111)
Creatinine: 1.41 mg/dL — ABNORMAL HIGH (ref 0.44–1.00)
GFR, Estimated: 40 mL/min — ABNORMAL LOW (ref >60)
Glucose, Bld: 184 mg/dL — ABNORMAL HIGH (ref 70–99)
Potassium: 4.6 mmol/L (ref 3.5–5.1)
Sodium: 135 mmol/L (ref 135–145)
Total Bilirubin: 0.2 mg/dL — ABNORMAL LOW (ref 0.3–1.2)
Total Protein: 6.4 g/dL — ABNORMAL LOW (ref 6.5–8.1)

## 2022-12-07 LAB — FERRITIN: Ferritin: 32 ng/mL (ref 11–307)

## 2022-12-07 MED ORDER — SODIUM CHLORIDE 0.9% FLUSH
10.0000 mL | Freq: Once | INTRAVENOUS | Status: AC
  Administered 2022-12-07: 10 mL

## 2022-12-07 MED ORDER — HEPARIN SOD (PORK) LOCK FLUSH 100 UNIT/ML IV SOLN
500.0000 [IU] | Freq: Once | INTRAVENOUS | Status: AC
  Administered 2022-12-07: 500 [IU]

## 2022-12-07 NOTE — Assessment & Plan Note (Signed)
h/o smoking intermittently for 40 years, then increased to daily after her mother passed in 2021.  -nodules initially seen on screening chest CT 07/12/19.  -Repeat 07/13/20 showed new nodules. PET scan on 07/28/20 showed mild hypermetabolism to dominant LUL nodule. Biopsy of left lung was attempted but held given difficult location. -she is now followed by Dr. Delton Coombes. Most recent CT 05/2022 showed stable lung nodules  -She continues to smoke. I again encouraged her to quit smoking.

## 2022-12-07 NOTE — Assessment & Plan Note (Signed)
--  Pt has intestinal AVM history with associated chronic GI blood loss resulting in iron deficiency and blood loss anemia. She has required blood transfusions in the past in 2013 and in 2018 and frequent hospital admission for anemia. She does not meet the diagnosis criteria for hereditary hemorrhagic telangiectasia. -Her last colonoscopy/endoscopy was 02/2019, recall 2025.  -She is not on oral iron, due to the lack of benefit.  -Currently being treated with IV Iron to prevent significant anemia with goal of Ferritin 100-200 range 

## 2022-12-08 ENCOUNTER — Telehealth: Payer: Self-pay | Admitting: Hematology

## 2022-12-22 ENCOUNTER — Other Ambulatory Visit: Payer: Self-pay

## 2023-01-04 ENCOUNTER — Inpatient Hospital Stay: Payer: 59 | Attending: Hematology

## 2023-01-04 ENCOUNTER — Other Ambulatory Visit: Payer: Self-pay

## 2023-01-04 DIAGNOSIS — N6315 Unspecified lump in the right breast, overlapping quadrants: Secondary | ICD-10-CM

## 2023-01-04 DIAGNOSIS — D500 Iron deficiency anemia secondary to blood loss (chronic): Secondary | ICD-10-CM | POA: Insufficient documentation

## 2023-01-04 DIAGNOSIS — K922 Gastrointestinal hemorrhage, unspecified: Secondary | ICD-10-CM | POA: Diagnosis present

## 2023-01-04 DIAGNOSIS — K5520 Angiodysplasia of colon without hemorrhage: Secondary | ICD-10-CM

## 2023-01-04 LAB — CBC WITH DIFFERENTIAL (CANCER CENTER ONLY)
Abs Immature Granulocytes: 0.03 10*3/uL (ref 0.00–0.07)
Basophils Absolute: 0 10*3/uL (ref 0.0–0.1)
Basophils Relative: 0 %
Eosinophils Absolute: 0.1 10*3/uL (ref 0.0–0.5)
Eosinophils Relative: 1 %
HCT: 33.2 % — ABNORMAL LOW (ref 36.0–46.0)
Hemoglobin: 10.3 g/dL — ABNORMAL LOW (ref 12.0–15.0)
Immature Granulocytes: 0 %
Lymphocytes Relative: 18 %
Lymphs Abs: 1.3 10*3/uL (ref 0.7–4.0)
MCH: 23 pg — ABNORMAL LOW (ref 26.0–34.0)
MCHC: 31 g/dL (ref 30.0–36.0)
MCV: 74.3 fL — ABNORMAL LOW (ref 80.0–100.0)
Monocytes Absolute: 0.4 10*3/uL (ref 0.1–1.0)
Monocytes Relative: 6 %
Neutro Abs: 5.7 10*3/uL (ref 1.7–7.7)
Neutrophils Relative %: 75 %
Platelet Count: 237 10*3/uL (ref 150–400)
RBC: 4.47 MIL/uL (ref 3.87–5.11)
RDW: 16.2 % — ABNORMAL HIGH (ref 11.5–15.5)
WBC Count: 7.6 10*3/uL (ref 4.0–10.5)
nRBC: 0 % (ref 0.0–0.2)

## 2023-01-04 LAB — CMP (CANCER CENTER ONLY)
ALT: 32 U/L (ref 0–44)
AST: 22 U/L (ref 15–41)
Albumin: 4 g/dL (ref 3.5–5.0)
Alkaline Phosphatase: 196 U/L — ABNORMAL HIGH (ref 38–126)
Anion gap: 5 (ref 5–15)
BUN: 23 mg/dL (ref 8–23)
CO2: 28 mmol/L (ref 22–32)
Calcium: 10 mg/dL (ref 8.9–10.3)
Chloride: 100 mmol/L (ref 98–111)
Creatinine: 1.21 mg/dL — ABNORMAL HIGH (ref 0.44–1.00)
GFR, Estimated: 48 mL/min — ABNORMAL LOW (ref >60)
Glucose, Bld: 398 mg/dL — ABNORMAL HIGH (ref 70–99)
Potassium: 4.5 mmol/L (ref 3.5–5.1)
Sodium: 133 mmol/L — ABNORMAL LOW (ref 135–145)
Total Bilirubin: 0.3 mg/dL (ref 0.3–1.2)
Total Protein: 7 g/dL (ref 6.5–8.1)

## 2023-01-04 LAB — FERRITIN: Ferritin: 14 ng/mL (ref 11–307)

## 2023-01-04 MED ORDER — SODIUM CHLORIDE 0.9% FLUSH
10.0000 mL | Freq: Once | INTRAVENOUS | Status: AC
  Administered 2023-01-04: 10 mL

## 2023-01-04 MED ORDER — HEPARIN SOD (PORK) LOCK FLUSH 100 UNIT/ML IV SOLN: 500.0000 [IU] | Freq: Once | INTRAVENOUS | Status: DC

## 2023-01-10 ENCOUNTER — Encounter: Payer: Self-pay | Admitting: Hematology

## 2023-01-12 ENCOUNTER — Other Ambulatory Visit (HOSPITAL_COMMUNITY): Payer: Self-pay | Admitting: Interventional Radiology

## 2023-01-12 ENCOUNTER — Telehealth (HOSPITAL_COMMUNITY): Payer: Self-pay

## 2023-01-12 DIAGNOSIS — I671 Cerebral aneurysm, nonruptured: Secondary | ICD-10-CM

## 2023-01-12 NOTE — Telephone Encounter (Signed)
Called to schedule mra, no answer, vm full. AB

## 2023-01-13 ENCOUNTER — Other Ambulatory Visit: Payer: Self-pay | Admitting: Hematology

## 2023-01-13 DIAGNOSIS — Z95828 Presence of other vascular implants and grafts: Secondary | ICD-10-CM

## 2023-02-01 ENCOUNTER — Inpatient Hospital Stay: Payer: 59 | Attending: Hematology

## 2023-02-01 DIAGNOSIS — D500 Iron deficiency anemia secondary to blood loss (chronic): Secondary | ICD-10-CM | POA: Diagnosis present

## 2023-02-01 DIAGNOSIS — K5520 Angiodysplasia of colon without hemorrhage: Secondary | ICD-10-CM

## 2023-02-01 DIAGNOSIS — N6315 Unspecified lump in the right breast, overlapping quadrants: Secondary | ICD-10-CM

## 2023-02-01 DIAGNOSIS — K922 Gastrointestinal hemorrhage, unspecified: Secondary | ICD-10-CM | POA: Insufficient documentation

## 2023-02-01 LAB — CBC WITH DIFFERENTIAL (CANCER CENTER ONLY)
Abs Immature Granulocytes: 0.06 10*3/uL (ref 0.00–0.07)
Basophils Absolute: 0 10*3/uL (ref 0.0–0.1)
Basophils Relative: 0 %
Eosinophils Absolute: 0.1 10*3/uL (ref 0.0–0.5)
Eosinophils Relative: 1 %
HCT: 30.4 % — ABNORMAL LOW (ref 36.0–46.0)
Hemoglobin: 9 g/dL — ABNORMAL LOW (ref 12.0–15.0)
Immature Granulocytes: 1 %
Lymphocytes Relative: 16 %
Lymphs Abs: 1.5 10*3/uL (ref 0.7–4.0)
MCH: 21.9 pg — ABNORMAL LOW (ref 26.0–34.0)
MCHC: 29.6 g/dL — ABNORMAL LOW (ref 30.0–36.0)
MCV: 74 fL — ABNORMAL LOW (ref 80.0–100.0)
Monocytes Absolute: 0.6 10*3/uL (ref 0.1–1.0)
Monocytes Relative: 6 %
Neutro Abs: 7.1 10*3/uL (ref 1.7–7.7)
Neutrophils Relative %: 76 %
Platelet Count: 217 10*3/uL (ref 150–400)
RBC: 4.11 MIL/uL (ref 3.87–5.11)
RDW: 16.4 % — ABNORMAL HIGH (ref 11.5–15.5)
WBC Count: 9.3 10*3/uL (ref 4.0–10.5)
nRBC: 0 % (ref 0.0–0.2)

## 2023-02-01 LAB — CMP (CANCER CENTER ONLY)
ALT: 12 U/L (ref 0–44)
AST: 13 U/L — ABNORMAL LOW (ref 15–41)
Albumin: 3.7 g/dL (ref 3.5–5.0)
Alkaline Phosphatase: 113 U/L (ref 38–126)
Anion gap: 4 — ABNORMAL LOW (ref 5–15)
BUN: 31 mg/dL — ABNORMAL HIGH (ref 8–23)
CO2: 28 mmol/L (ref 22–32)
Calcium: 10.1 mg/dL (ref 8.9–10.3)
Chloride: 101 mmol/L (ref 98–111)
Creatinine: 1.27 mg/dL — ABNORMAL HIGH (ref 0.44–1.00)
GFR, Estimated: 45 mL/min — ABNORMAL LOW (ref >60)
Glucose, Bld: 378 mg/dL — ABNORMAL HIGH (ref 70–99)
Potassium: 4.7 mmol/L (ref 3.5–5.1)
Sodium: 133 mmol/L — ABNORMAL LOW (ref 135–145)
Total Bilirubin: 0.4 mg/dL (ref 0.3–1.2)
Total Protein: 6.5 g/dL (ref 6.5–8.1)

## 2023-02-01 LAB — FERRITIN: Ferritin: 12 ng/mL (ref 11–307)

## 2023-02-01 MED ORDER — SODIUM CHLORIDE 0.9% FLUSH
10.0000 mL | Freq: Once | INTRAVENOUS | Status: AC
  Administered 2023-02-01: 10 mL

## 2023-02-01 MED ORDER — HEPARIN SOD (PORK) LOCK FLUSH 100 UNIT/ML IV SOLN
500.0000 [IU] | Freq: Once | INTRAVENOUS | Status: AC
  Administered 2023-02-01: 500 [IU]

## 2023-03-01 ENCOUNTER — Inpatient Hospital Stay: Payer: 59 | Attending: Hematology | Admitting: Nurse Practitioner

## 2023-03-01 ENCOUNTER — Encounter: Payer: Self-pay | Admitting: Nurse Practitioner

## 2023-03-01 ENCOUNTER — Inpatient Hospital Stay: Payer: 59

## 2023-03-01 VITALS — BP 138/52 | HR 64 | Temp 98.0°F | Resp 17 | Wt 176.1 lb

## 2023-03-01 DIAGNOSIS — D500 Iron deficiency anemia secondary to blood loss (chronic): Secondary | ICD-10-CM | POA: Diagnosis present

## 2023-03-01 DIAGNOSIS — K5521 Angiodysplasia of colon with hemorrhage: Secondary | ICD-10-CM | POA: Insufficient documentation

## 2023-03-01 DIAGNOSIS — Z452 Encounter for adjustment and management of vascular access device: Secondary | ICD-10-CM | POA: Diagnosis not present

## 2023-03-01 DIAGNOSIS — K5520 Angiodysplasia of colon without hemorrhage: Secondary | ICD-10-CM

## 2023-03-01 DIAGNOSIS — Q2733 Arteriovenous malformation of digestive system vessel: Secondary | ICD-10-CM | POA: Diagnosis present

## 2023-03-01 DIAGNOSIS — N6315 Unspecified lump in the right breast, overlapping quadrants: Secondary | ICD-10-CM

## 2023-03-01 LAB — CMP (CANCER CENTER ONLY)
ALT: 18 U/L (ref 0–44)
AST: 14 U/L — ABNORMAL LOW (ref 15–41)
Albumin: 3.9 g/dL (ref 3.5–5.0)
Alkaline Phosphatase: 131 U/L — ABNORMAL HIGH (ref 38–126)
Anion gap: 6 (ref 5–15)
BUN: 16 mg/dL (ref 8–23)
CO2: 27 mmol/L (ref 22–32)
Calcium: 10.3 mg/dL (ref 8.9–10.3)
Chloride: 104 mmol/L (ref 98–111)
Creatinine: 1.47 mg/dL — ABNORMAL HIGH (ref 0.44–1.00)
GFR, Estimated: 37 mL/min — ABNORMAL LOW (ref >60)
Glucose, Bld: 250 mg/dL — ABNORMAL HIGH (ref 70–99)
Potassium: 4.5 mmol/L (ref 3.5–5.1)
Sodium: 137 mmol/L (ref 135–145)
Total Bilirubin: 0.5 mg/dL (ref 0.3–1.2)
Total Protein: 6.8 g/dL (ref 6.5–8.1)

## 2023-03-01 LAB — CBC WITH DIFFERENTIAL (CANCER CENTER ONLY)
Abs Immature Granulocytes: 0.02 10*3/uL (ref 0.00–0.07)
Basophils Absolute: 0 10*3/uL (ref 0.0–0.1)
Basophils Relative: 0 %
Eosinophils Absolute: 0.1 10*3/uL (ref 0.0–0.5)
Eosinophils Relative: 2 %
HCT: 29.5 % — ABNORMAL LOW (ref 36.0–46.0)
Hemoglobin: 8.7 g/dL — ABNORMAL LOW (ref 12.0–15.0)
Immature Granulocytes: 0 %
Lymphocytes Relative: 15 %
Lymphs Abs: 1.1 10*3/uL (ref 0.7–4.0)
MCH: 21.3 pg — ABNORMAL LOW (ref 26.0–34.0)
MCHC: 29.5 g/dL — ABNORMAL LOW (ref 30.0–36.0)
MCV: 72.1 fL — ABNORMAL LOW (ref 80.0–100.0)
Monocytes Absolute: 0.5 10*3/uL (ref 0.1–1.0)
Monocytes Relative: 8 %
Neutro Abs: 5.2 10*3/uL (ref 1.7–7.7)
Neutrophils Relative %: 75 %
Platelet Count: 264 10*3/uL (ref 150–400)
RBC: 4.09 MIL/uL (ref 3.87–5.11)
RDW: 18.7 % — ABNORMAL HIGH (ref 11.5–15.5)
WBC Count: 6.9 10*3/uL (ref 4.0–10.5)
nRBC: 0 % (ref 0.0–0.2)

## 2023-03-01 LAB — FERRITIN: Ferritin: 11 ng/mL (ref 11–307)

## 2023-03-01 MED ORDER — SODIUM CHLORIDE 0.9% FLUSH
10.0000 mL | Freq: Once | INTRAVENOUS | Status: AC
  Administered 2023-03-01: 10 mL

## 2023-03-01 MED ORDER — HEPARIN SOD (PORK) LOCK FLUSH 100 UNIT/ML IV SOLN
500.0000 [IU] | Freq: Once | INTRAVENOUS | Status: AC
  Administered 2023-03-01: 500 [IU]

## 2023-03-01 NOTE — Assessment & Plan Note (Signed)
--  Pt has intestinal AVM history with associated chronic GI blood loss resulting in iron deficiency and blood loss anemia. She has required blood transfusions in the past in 2013 and in 2018 and frequent hospital admission for anemia. She does not meet the diagnosis criteria for hereditary hemorrhagic telangiectasia. -Her last colonoscopy/endoscopy was 02/2019, recall 2025.  -She is not on oral iron, due to the lack of benefit.  -Currently being treated with IV Iron to prevent significant anemia with goal of Ferritin 100-200 range

## 2023-03-01 NOTE — Progress Notes (Addendum)
[facility] OFFICE PROGRESS NOTE  Bethany Back, NP  ASSESSMENT & PLAN:  GI AVM (gastrointestinal arteriovenous vascular malformation) -Pt has intestinal AVM history with associated chronic GI blood loss resulting in iron deficiency and blood loss anemia. She has required blood transfusions in the past in 2013 and in 2018 and frequent hospital admission for anemia. She does not meet the diagnosis criteria for hereditary hemorrhagic telangiectasia. -Her last colonoscopy/endoscopy was 02/2019, recall 2025.  -She is not on oral iron, due to the lack of benefit.  -Currently being treated with IV Iron to prevent significant anemia with goal of Ferritin 100-200 range  Plan: Labs reviewed  -CBC showing WBC 6.9; Hgb 8.7; Hct 29.3; Plt 264; Anc 5.2 -CMP - K 4.5; glucose 250; BUN 16; Creatinine 1.47; eGFR 37; Ca 10.3; AST 14; ALT 18; AlkPhos 131 -Ferritin - pending.    Discussed likely need for IV iron, and/or pRBCs, based on ferritin results. Patient is agreeable to this. Will schedule as indicated. Recheck labs monthly. Follow up in 2 months with labs.   The total time spent in the appointment was 30 minutes encounter with patients including review of chart and various tests results, discussions about plan of care and coordination of care plan   All questions were answered. The patient knows to call the clinic with any problems, questions or concerns. No barriers to learning was detected.    Carlean Jews, NP 10/16/20242:23 PM  INTERVAL HISTORY: Bethany [name] 72 y.o. female returns for iron deficiency anemia due to chronic blood loss due to GI AVM. She was last seen per Dr. Mosetta Putt on 12/07/2022. She presents to the office alone. She states that she is feeling well. Denies excess fatigue, shortness of breath, or headache. Denies blood in the stool or in urine. She is reporting increased neuropathy in her fingers and toes. She is insulin dependant diabetic and sees primary care  for management of diabetes and sees neurology for management of neuropathy and other neurological conditions. She has appointment with her primary care provider later today.   SUMMARY OF HEMATOLOGIC HISTORY: -Pt has intestinal AVM history with associated chronic GI blood loss resulting in iron deficiency and blood loss anemia. She has required blood transfusions in the past in 2013 and in 2018 and frequent hospital admission for anemia. She does not meet the diagnosis criteria for hereditary hemorrhagic telangiectasia. -Her last colonoscopy/endoscopy was 02/2019, recall 2025.  -She is not on oral iron, due to the lack of benefit.  -Currently being treated with IV Iron to prevent significant anemia with goal of Ferritin 100-200 range  I have reviewed the past medical history, past surgical history, social history and family history with the patient and they are unchanged from previous note.  ALLERGIES:  is allergic to contrast media [iodinated contrast media] and other.  MEDICATIONS:  Current Outpatient Medications  Medication Sig Dispense Refill   allopurinol (ZYLOPRIM) 100 MG tablet Take 100 mg by mouth at bedtime.     amLODipine (NORVASC) 10 MG tablet Take 10 mg by mouth daily.     atorvastatin (LIPITOR) 40 MG tablet Take 40 mg by mouth daily.     Dulaglutide (TRULICITY) 1.5 MG/0.5ML SOPN Inject 1.5 mg into the skin every Monday.      ezetimibe (ZETIA) 10 MG tablet Take 10 mg by mouth daily.     famotidine (PEPCID) 20 MG tablet Take 20 mg by mouth 2 (two) times daily.     feeding supplement, ENSURE  ENLIVE, (ENSURE ENLIVE) LIQD Take 237 mLs by mouth 2 (two) times daily between meals. 237 mL 12   insulin glargine, 1 Unit Dial, (TOUJEO SOLOSTAR) 300 UNIT/ML Solostar Pen Inject 20 Units into the skin at bedtime.      insulin lispro (HUMALOG) 100 UNIT/ML KwikPen Inject 5 Units into the skin 3 (three) times daily as needed (blood sugar >200).     levothyroxine (SYNTHROID) 75 MCG tablet Take 75  mcg by mouth daily.     lidocaine-prilocaine (EMLA) cream APPLY 2-5 CMS OF CREAM TO PORT SITE 45 MINUTES TO 1 HOUR PRIOR TO ACCESS 30 g 10   losartan (COZAAR) 25 MG tablet Take 1 tablet (25 mg total) by mouth daily. 30 tablet 11   metoprolol (LOPRESSOR) 50 MG tablet Take 50 mg by mouth 2 (two) times daily.      NARCAN 4 MG/0.1ML LIQD nasal spray kit Place 1 spray into the nose as needed for opioid reversal.     Omega-3 Fatty Acids (FISH OIL PO) Take 1 capsule by mouth 2 (two) times daily.     oxyCODONE (OXY IR/ROXICODONE) 5 MG immediate release tablet Take 1 tablet (5 mg total) by mouth every 6 (six) hours as needed for severe pain or breakthrough pain. 20 tablet 0   OZEMPIC, 0.25 OR 0.5 MG/DOSE, 2 MG/3ML SOPN Inject 0.5 mg into the skin once a week.     pantoprazole (PROTONIX) 40 MG tablet Take 1 tablet (40 mg total) by mouth 2 (two) times daily before a meal. 60 tablet 0   polyethylene glycol powder (MIRALAX) 17 GM/SCOOP powder Take 17 g by mouth 2 (two) times daily as needed for moderate constipation. 255 g 0   senna-docusate (SENOKOT-S) 8.6-50 MG tablet Take 1 tablet by mouth 2 (two) times daily between meals as needed for mild constipation. 60 tablet 0   Tetrahydrozoline HCl (VISINE OP) Place 1 drop into both eyes 3 (three) times daily as needed (dry eyes).     furosemide (LASIX) 40 MG tablet Take 40 mg by mouth daily as needed for fluid or edema. (Patient not taking: Reported on 03/01/2023)     gabapentin (NEURONTIN) 100 MG capsule Take 2 capsules (200 mg total) by mouth 3 (three) times daily.     Ipratropium-Albuterol (COMBIVENT) 20-100 MCG/ACT AERS respimat Inhale 1 puff into the lungs every 6 (six) hours as needed for wheezing or shortness of breath. (Patient not taking: Reported on 03/01/2023) 1 Inhaler 0   No current facility-administered medications for this visit.     REVIEW OF SYSTEMS:   Constitutional: Denies fevers, chills or night sweats. Eyes: Denies blurriness of vision Ears,  nose, mouth, throat, and face: Denies mucositis or sore throat Respiratory: Denies cough, dyspnea or wheezes Cardiovascular: Denies palpitation, chest discomfort or lower extremity swelling Gastrointestinal:  Denies nausea, heartburn or change in bowel habits Skin: Denies abnormal skin rashes Lymphatics: Denies new lymphadenopathy or easy bruising Neurological: worsening neuropathy of hands and feet.  Behavioral/Psych: Mood is stable, no new changes  All other systems were reviewed with the patient and are negative.  PHYSICAL EXAMINATION: ECOG PERFORMANCE STATUS: 1 - Symptomatic but completely ambulatory  Vitals:   03/01/23 1309  BP: (!) 138/52  Pulse: 64  Resp: 17  Temp: 98 F (36.7 C)  SpO2: 99%   Filed Weights   03/01/23 1309  Weight: 176 lb 1.6 oz (79.9 kg)    GENERAL:alert, no distress and comfortable SKIN: skin color, texture, turgor are normal, no rashes or significant  lesions EYES: normal, Conjunctiva are pink and non-injected, sclera clear OROPHARYNX:no exudate, no erythema and lips, buccal mucosa, and tongue normal  NECK: supple, thyroid normal size, non-tender, without nodularity LYMPH:  no palpable lymphadenopathy in the cervical, axillary or inguinal LUNGS: clear to auscultation and percussion with normal breathing effort HEART: regular rate & rhythm and no murmurs and no lower extremity edema ABDOMEN:abdomen soft, non-tender and normal bowel sounds Musculoskeletal:no cyanosis of digits and no clubbing  NEURO: alert & oriented x 3 with fluent speech, no focal motor/sensory deficits  LABORATORY DATA:  I have reviewed the data as listed     Component Value Date/Time   NA 137 03/01/2023 1232   NA 140 05/17/2017 0855   K 4.5 03/01/2023 1232   K 4.0 05/17/2017 0855   CL 104 03/01/2023 1232   CO2 27 03/01/2023 1232   CO2 24 05/17/2017 0855   GLUCOSE 250 (H) 03/01/2023 1232   GLUCOSE 153 (H) 05/17/2017 0855   BUN 16 03/01/2023 1232   BUN 18.9 05/17/2017  0855   CREATININE 1.47 (H) 03/01/2023 1232   CREATININE 1.2 (H) 05/17/2017 0855   CALCIUM 10.3 03/01/2023 1232   CALCIUM 10.0 05/17/2017 0855   PROT 6.8 03/01/2023 1232   PROT 6.9 05/17/2017 0855   ALBUMIN 3.9 03/01/2023 1232   ALBUMIN 3.6 05/17/2017 0855   AST 14 (L) 03/01/2023 1232   AST 15 05/17/2017 0855   ALT 18 03/01/2023 1232   ALT 16 05/17/2017 0855   ALKPHOS 131 (H) 03/01/2023 1232   ALKPHOS 121 05/17/2017 0855   BILITOT 0.5 03/01/2023 1232   BILITOT <0.22 05/17/2017 0855   GFRNONAA 37 (L) 03/01/2023 1232   GFRAA 39 (L) 02/06/2020 0718   GFRAA 53 (L) 01/24/2020 1212    Lab Results  Component Value Date   WBC 6.9 03/01/2023   NEUTROABS 5.2 03/01/2023   HGB 8.7 (L) 03/01/2023   HCT 29.5 (L) 03/01/2023   MCV 72.1 (L) 03/01/2023   PLT 264 03/01/2023    Lab Results  Component Value Date   FERRITIN 12 02/01/2023    Addendum I have seen the patient, examined her. I agree with the assessment and and plan and have edited the notes.   Phyllistine is clinically doing well, denies any recent GI bleeding.  Her energy level is decent.  Her hemoglobin has been trending down in the past few months, 8.7 today, no need of blood transfusion, her last ferritin was low at 12 on February 01, 2023.  We will schedule her IV iron for total of 1 g.  Will continue monthly lab follow-up.  All questions were answered.  Malachy Mood MD 03/01/2023

## 2023-03-07 ENCOUNTER — Telehealth: Payer: Self-pay

## 2023-03-07 ENCOUNTER — Other Ambulatory Visit: Payer: Self-pay

## 2023-03-07 DIAGNOSIS — D500 Iron deficiency anemia secondary to blood loss (chronic): Secondary | ICD-10-CM

## 2023-03-07 NOTE — Telephone Encounter (Signed)
Hello,  Patient will be scheduled as soon as possible.  Auth Submission: NO AUTH NEEDED Site of care: Site of care: [facility] Payer: [employer] Dual complete medicare Medication & CPT/J Code(s) submitted: Ferrlecit (Ferric Gluconate) [id number] Route of submission (phone, fax, portal): portal Phone # Fax # Auth type: Buy/Bill PB Units/visits requested: 250mg , 4 doses, weekly Reference number: [id number] Approval from: 03/07/23 to 05/16/23

## 2023-03-21 ENCOUNTER — Encounter: Payer: Self-pay | Admitting: Hematology

## 2023-03-21 ENCOUNTER — Ambulatory Visit: Payer: 59

## 2023-03-21 ENCOUNTER — Encounter: Payer: Self-pay | Admitting: Nurse Practitioner

## 2023-03-21 VITALS — BP 149/69 | HR 61 | Temp 98.4°F | Resp 20 | Ht 65.0 in | Wt 176.0 lb

## 2023-03-21 DIAGNOSIS — K922 Gastrointestinal hemorrhage, unspecified: Secondary | ICD-10-CM

## 2023-03-21 DIAGNOSIS — D500 Iron deficiency anemia secondary to blood loss (chronic): Secondary | ICD-10-CM

## 2023-03-21 MED ORDER — DIPHENHYDRAMINE HCL 25 MG PO CAPS: 25.0000 mg | ORAL_CAPSULE | Freq: Once | ORAL | Status: DC

## 2023-03-21 MED ORDER — SODIUM CHLORIDE 0.9 % IV SOLN
250.0000 mg | Freq: Once | INTRAVENOUS | Status: AC
  Administered 2023-03-21: 250 mg via INTRAVENOUS
  Filled 2023-03-21: qty 20

## 2023-03-21 MED ORDER — ACETAMINOPHEN 325 MG PO TABS
650.0000 mg | ORAL_TABLET | Freq: Once | ORAL | Status: DC
Start: 2023-03-21 — End: 2023-03-21

## 2023-03-21 MED ORDER — HEPARIN SOD (PORK) LOCK FLUSH 100 UNIT/ML IV SOLN
500.0000 [IU] | Freq: Once | INTRAVENOUS | Status: AC | PRN
  Administered 2023-03-21: 500 [IU]
  Filled 2023-03-21: qty 5

## 2023-03-21 NOTE — Progress Notes (Signed)
Diagnosis: Acute Anemia  Provider:  Chilton Greathouse MD  Procedure: IV Infusion  IV Type: Port a Cath, IV Location: R Chest  Ferrlecit (ferric gluconate), Dose: 250 mg  Infusion Start Time: 1035  Infusion Stop Time: 1246  Post Infusion IV Care: Patient declined observation and Port a Cath Deaccessed/Flushed and heparin locked  Discharge: Condition: Good, Destination: Home . AVS Provided  Performed by:  Wyvonne Lenz, RN

## 2023-03-29 ENCOUNTER — Inpatient Hospital Stay: Payer: 59

## 2023-03-29 ENCOUNTER — Ambulatory Visit: Payer: 59

## 2023-03-29 VITALS — BP 125/68 | HR 63 | Temp 98.5°F | Resp 14 | Ht 65.0 in | Wt 175.8 lb

## 2023-03-29 DIAGNOSIS — K922 Gastrointestinal hemorrhage, unspecified: Secondary | ICD-10-CM

## 2023-03-29 DIAGNOSIS — D500 Iron deficiency anemia secondary to blood loss (chronic): Secondary | ICD-10-CM | POA: Diagnosis not present

## 2023-03-29 MED ORDER — DIPHENHYDRAMINE HCL 25 MG PO CAPS
25.0000 mg | ORAL_CAPSULE | Freq: Once | ORAL | Status: DC
Start: 2023-03-29 — End: 2023-03-29

## 2023-03-29 MED ORDER — HEPARIN SOD (PORK) LOCK FLUSH 100 UNIT/ML IV SOLN
500.0000 [IU] | Freq: Once | INTRAVENOUS | Status: AC | PRN
  Administered 2023-03-29: 500 [IU]

## 2023-03-29 MED ORDER — SODIUM CHLORIDE 0.9 % IV SOLN
250.0000 mg | Freq: Once | INTRAVENOUS | Status: AC
  Administered 2023-03-29: 250 mg via INTRAVENOUS
  Filled 2023-03-29: qty 20

## 2023-03-29 MED ORDER — ACETAMINOPHEN 325 MG PO TABS: 650.0000 mg | ORAL_TABLET | Freq: Once | ORAL | Status: DC

## 2023-03-29 NOTE — Progress Notes (Signed)
Diagnosis: Acute Anemia  Provider:  Chilton Greathouse MD  Procedure: IV Infusion  IV Type: Port a Cath, IV Location: R Chest  Ferrlecit (ferric gluconate), Dose: 250 mg  Infusion Start Time: 1046  Infusion Stop Time: 1300  Post Infusion IV Care: Patient declined observation and Port a Cath Deaccessed/Flushed  Discharge: Condition: Good, Destination: Home . AVS Declined  Performed by:  Nat Math, RN

## 2023-04-05 ENCOUNTER — Ambulatory Visit: Payer: 59

## 2023-04-05 VITALS — BP 137/68 | HR 64 | Temp 98.4°F | Resp 20 | Ht 65.0 in | Wt 171.6 lb

## 2023-04-05 DIAGNOSIS — D500 Iron deficiency anemia secondary to blood loss (chronic): Secondary | ICD-10-CM | POA: Diagnosis not present

## 2023-04-05 DIAGNOSIS — K922 Gastrointestinal hemorrhage, unspecified: Secondary | ICD-10-CM | POA: Diagnosis not present

## 2023-04-05 MED ORDER — DIPHENHYDRAMINE HCL 25 MG PO CAPS: 25.0000 mg | ORAL_CAPSULE | Freq: Once | ORAL | Status: DC

## 2023-04-05 MED ORDER — ACETAMINOPHEN 325 MG PO TABS: 650.0000 mg | ORAL_TABLET | Freq: Once | ORAL | Status: DC

## 2023-04-05 MED ORDER — HEPARIN SOD (PORK) LOCK FLUSH 100 UNIT/ML IV SOLN
500.0000 [IU] | Freq: Once | INTRAVENOUS | Status: AC | PRN
  Administered 2023-04-05: 500 [IU]

## 2023-04-05 MED ORDER — SODIUM CHLORIDE 0.9 % IV SOLN
250.0000 mg | Freq: Once | INTRAVENOUS | Status: AC
  Administered 2023-04-05: 250 mg via INTRAVENOUS
  Filled 2023-04-05: qty 20

## 2023-04-05 NOTE — Progress Notes (Signed)
Diagnosis: Anemia due to chronic blood loss   Provider:  Chilton Greathouse MD  Procedure: IV Infusion  IV Type: Port a Cath, IV Location: R Chest  Ferrlecit (ferric gluconate), Dose: 250 mg  Infusion Start Time: 1011  Infusion Stop Time: 1222  Post Infusion IV Care: Patient declined observation and Peripheral IV Discontinued  Discharge: Condition: Good, Destination: Home . AVS Declined  Performed by:  Garnette Czech, RN

## 2023-04-12 ENCOUNTER — Ambulatory Visit: Payer: 59

## 2023-04-25 NOTE — Progress Notes (Unsigned)
Patient Care Team: [name], NP as PCP - General (Nurse Practitioner) Flora Lipps, Ronnald Ramp, MD as PCP - Cardiology (Cardiology) Malachy Mood, MD as Consulting Physician (Hematology) Chuck Hint, MD (Inactive) as Consulting Physician (Vascular Surgery) Eleanora Neighbor, MD as Referring Physician (Gastroenterology)  Clinic Day:  04/26/2023  Referring physician: [name], NP  ASSESSMENT & PLAN:   Assessment & Plan: GI AVM (gastrointestinal arteriovenous vascular malformation) -Pt has intestinal AVM history with associated chronic GI blood loss resulting in iron deficiency and blood loss anemia. She has required blood transfusions in the past in 2013 and in 2018 and frequent hospital admission for anemia. She does not meet the diagnosis criteria for hereditary hemorrhagic telangiectasia. -Her last colonoscopy/endoscopy was 02/2019, recall 2025.  -She is not on oral iron, due to the lack of benefit.  -Currently being treated with IV Iron to prevent significant anemia with goal of Ferritin 100-200 range -Significant elevation of liver enzymes on 04/26/2023.  Ultrasound of the abdomen ordered.  This has been ordered in December 2023.  Imaging study has not been done yet.  Patient understands that she will get a call from radiology to make appointment.  Plan to review this result with patient in 3 weeks at next follow-up.   Plan: Labs reviewed  -CBC showing WBC 7.2; Hgb 10.9; Hct 37.9; Plt 232; Anc 4.8 -CMP - K 4.6; glucose 164; BUN 19; Creatinine 1.67; eGFR 32; Ca 10.5; AST 198; ALT 95; ALKP 283.   -Ferritin pending --Significant change in liver functions since check in October 2024.  Ultrasound of the abdomen ordered for further evaluation. Patient scheduled for IV iron treatment tomorrow, 04/27/2023. Recheck labs with ferritin in 3 weeks with follow-up.  The patient understands the plans discussed today and is in agreement with them.  She knows to contact our office if she  develops concerns prior to her next appointment.  I provided 30 minutes of face-to-face time during this encounter and > 50% was spent counseling as documented under my assessment and plan.    Carlean Jews, NP  [facility] - [facility] DEPT OF [facility] [address] [place] [address] Dept: [phone] Dept Fax: [phone]   Orders Placed This Encounter  Procedures   US Abdomen Complete    Standing Status:   Future    Standing Expiration Date:   04/25/2024    Order Specific Question:   Reason for Exam (SYMPTOM  OR DIAGNOSIS REQUIRED)    Answer:   very elevated liver functions    Comments:   history of GI AVM and severe iron deficiency anemia    Order Specific Question:   Preferred imaging location?    Answer:   [facility]   CBC with Differential (Cancer Center Only)    Standing Status:   Future    Standing Expiration Date:   04/25/2024   CMP (Cancer Center only)    Standing Status:   Future    Standing Expiration Date:   04/25/2024   Ferritin    Standing Status:   Future    Standing Expiration Date:   04/25/2024      CHIEF COMPLAINT:  CC: GI AVM  Current Treatment: IV iron and PRBCs as needed.  For goal of ferritin level 100-200  INTERVAL HISTORY:  Bethany [name] is here today for repeat clinical assessment.  She was last seen by myself on 03/03/2023.  Has been getting regular treatments with  IV Ferrlecit at [facility] infusion center.  Her most recent treatment was 04/05/2023.  She has an additional treatment scheduled for 04/27/2023.  Will recheck her labs along with ferritin level in 3 weeks.  Due to significant elevation in liver functions from 03/03/2023, will get ultrasound of her abdomen.  She denies abdominal pain, nausea, vomiting, diarrhea, or unusual constipation.  Denies bloating or swelling of the abdomen. Her appetite is good. Her weight has been stable. She denies chest pain, chest  pressure, or shortness of breath. She denies headaches or visual disturbances.   I have reviewed the past medical history, past surgical history, social history and family history with the patient and they are unchanged from previous note.  ALLERGIES:  is allergic to contrast media [iodinated contrast media] and other.  MEDICATIONS:  Current Outpatient Medications  Medication Sig Dispense Refill   albuterol (VENTOLIN HFA) 108 (90 Base) MCG/ACT inhaler Inhale 1-2 puffs into the lungs every 4 (four) hours as needed for wheezing or shortness of breath.     allopurinol (ZYLOPRIM) 100 MG tablet Take 100 mg by mouth at bedtime.     amLODipine (NORVASC) 10 MG tablet Take 10 mg by mouth daily.     atorvastatin (LIPITOR) 40 MG tablet Take 40 mg by mouth daily.     atorvastatin (LIPITOR) 80 MG tablet Take 80 mg by mouth daily.     baclofen (LIORESAL) 10 MG tablet Take 10-20 mg by mouth 3 (three) times daily as needed.     docusate sodium (COLACE) 100 MG capsule Take 100 mg by mouth at bedtime.     Dulaglutide (TRULICITY) 1.5 MG/0.5ML SOPN Inject 1.5 mg into the skin every Monday.      ezetimibe (ZETIA) 10 MG tablet Take 10 mg by mouth daily.     famotidine (PEPCID) 20 MG tablet Take 20 mg by mouth 2 (two) times daily.     feeding supplement, ENSURE ENLIVE, (ENSURE ENLIVE) LIQD Take 237 mLs by mouth 2 (two) times daily between meals. 237 mL 12   furosemide (LASIX) 40 MG tablet Take 40 mg by mouth daily as needed for fluid or edema.     gabapentin (NEURONTIN) 100 MG capsule Take 2 capsules (200 mg total) by mouth 3 (three) times daily.     insulin glargine, 1 Unit Dial, (TOUJEO SOLOSTAR) 300 UNIT/ML Solostar Pen Inject 20 Units into the skin at bedtime.      insulin lispro (HUMALOG) 100 UNIT/ML KwikPen Inject 5 Units into the skin 3 (three) times daily as needed (blood sugar >200).     Ipratropium-Albuterol (COMBIVENT) 20-100 MCG/ACT AERS respimat Inhale 1 puff into the lungs every 6 (six) hours as  needed for wheezing or shortness of breath. 1 Inhaler 0   JARDIANCE 25 MG TABS tablet Take 25 mg by mouth every morning.     levothyroxine (SYNTHROID) 75 MCG tablet Take 75 mcg by mouth daily.     lidocaine-prilocaine (EMLA) cream APPLY 2-5 CMS OF CREAM TO PORT SITE 45 MINUTES TO 1 HOUR PRIOR TO ACCESS 30 g 10   magnesium oxide (MAG-OX) 400 MG tablet Take 1 tablet by mouth daily.     metoprolol (LOPRESSOR) 50 MG tablet Take 50 mg by mouth 2 (two) times daily.      NARCAN 4 MG/0.1ML LIQD nasal spray kit Place 1 spray into the nose as needed for opioid reversal.     Omega-3 Fatty Acids (FISH OIL PO) Take 1 capsule by mouth 2 (two) times daily.  Omega-3 Fatty Acids (FISH OIL) 1000 MG CAPS Take 1 capsule by mouth 2 (two) times daily.     oxyCODONE (OXY IR/ROXICODONE) 5 MG immediate release tablet Take 1 tablet (5 mg total) by mouth every 6 (six) hours as needed for severe pain or breakthrough pain. 20 tablet 0   oxyCODONE-acetaminophen (PERCOCET) 10-325 MG tablet Take 1 tablet by mouth every 4 (four) hours as needed.     OZEMPIC, 0.25 OR 0.5 MG/DOSE, 2 MG/3ML SOPN Inject 0.5 mg into the skin once a week.     OZEMPIC, 1 MG/DOSE, 4 MG/3ML SOPN Inject 1 mg into the skin once a week.     pantoprazole (PROTONIX) 40 MG tablet Take 1 tablet (40 mg total) by mouth 2 (two) times daily before a meal. 60 tablet 0   polyethylene glycol powder (MIRALAX) 17 GM/SCOOP powder Take 17 g by mouth 2 (two) times daily as needed for moderate constipation. 255 g 0   senna-docusate (SENOKOT-S) 8.6-50 MG tablet Take 1 tablet by mouth 2 (two) times daily between meals as needed for mild constipation. 60 tablet 0   Tetrahydrozoline HCl (VISINE OP) Place 1 drop into both eyes 3 (three) times daily as needed (dry eyes).     losartan (COZAAR) 25 MG tablet Take 1 tablet (25 mg total) by mouth daily. 30 tablet 11   No current facility-administered medications for this visit.    HISTORY OF PRESENT ILLNESS:   Oncology History   Iron deficiency anemia secondary to blood loss (chronic)      REVIEW OF SYSTEMS:   Constitutional: Denies fevers, chills or abnormal weight loss Eyes: Denies blurriness of vision Ears, nose, mouth, throat, and face: Denies mucositis or sore throat Respiratory: Denies cough, dyspnea or wheezes Cardiovascular: Denies palpitation, chest discomfort or lower extremity swelling Gastrointestinal:  Denies nausea, heartburn or change in bowel habits Skin: Denies abnormal skin rashes Lymphatics: Denies new lymphadenopathy or easy bruising Neurological:Denies numbness, tingling or new weaknesses Behavioral/Psych: Mood is stable, no new changes  All other systems were reviewed with the patient and are negative.   VITALS:   Today's Vitals   04/26/23 1152  BP: (!) 120/59  Pulse: (!) 57  Resp: 17  Temp: 98.2 F (36.8 C)  TempSrc: Temporal  SpO2: 98%  Weight: 172 lb 1.6 oz (78.1 kg)  Height: 5\' 5"  (1.651 m)   Body mass index is 28.64 kg/m.   Wt Readings from Last 3 Encounters:  04/26/23 172 lb 1.6 oz (78.1 kg)  04/05/23 171 lb 9.6 oz (77.8 kg)  03/29/23 175 lb 12.8 oz (79.7 kg)    Body mass index is 28.64 kg/m.  Performance status (ECOG): 1 - Symptomatic but completely ambulatory  PHYSICAL EXAM:   GENERAL:alert, no distress and comfortable SKIN: skin color, texture, turgor are normal, no rashes or significant lesions EYES: normal, Conjunctiva are pink and non-injected, sclera clear OROPHARYNX:no exudate, no erythema and lips, buccal mucosa, and tongue normal  NECK: supple, thyroid normal size, non-tender, without nodularity LYMPH:  no palpable lymphadenopathy in the cervical, axillary or inguinal LUNGS: clear to auscultation and percussion with normal breathing effort HEART: regular rate & rhythm and no murmurs and no lower extremity edema ABDOMEN:abdomen soft, non-tender and normal bowel sounds Musculoskeletal:no cyanosis of digits and no clubbing  NEURO: alert & oriented  x 3 with fluent speech, no focal motor/sensory deficits  LABORATORY DATA:  I have reviewed the data as listed    Component Value Date/Time   NA 140 04/26/2023 1113  NA 140 05/17/2017 0855   K 4.6 04/26/2023 1113   K 4.0 05/17/2017 0855   CL 108 04/26/2023 1113   CO2 26 04/26/2023 1113   CO2 24 05/17/2017 0855   GLUCOSE 164 (H) 04/26/2023 1113   GLUCOSE 153 (H) 05/17/2017 0855   BUN 19 04/26/2023 1113   BUN 18.9 05/17/2017 0855   CREATININE 1.67 (H) 04/26/2023 1113   CREATININE 1.2 (H) 05/17/2017 0855   CALCIUM 10.5 (H) 04/26/2023 1113   CALCIUM 10.0 05/17/2017 0855   PROT 7.0 04/26/2023 1113   PROT 6.9 05/17/2017 0855   ALBUMIN 4.1 04/26/2023 1113   ALBUMIN 3.6 05/17/2017 0855   AST 198 (HH) 04/26/2023 1113   AST 15 05/17/2017 0855   ALT 95 (H) 04/26/2023 1113   ALT 16 05/17/2017 0855   ALKPHOS 283 (H) 04/26/2023 1113   ALKPHOS 121 05/17/2017 0855   BILITOT 0.4 04/26/2023 1113   BILITOT <0.22 05/17/2017 0855   GFRNONAA 32 (L) 04/26/2023 1113   GFRAA 39 (L) 02/06/2020 0718   GFRAA 53 (L) 01/24/2020 1212     Lab Results  Component Value Date   WBC 7.2 04/26/2023   NEUTROABS 4.8 04/26/2023   HGB 10.9 (L) 04/26/2023   HCT 37.9 04/26/2023   MCV 75.3 (L) 04/26/2023   PLT 232 04/26/2023

## 2023-04-25 NOTE — Assessment & Plan Note (Signed)
--  Pt has intestinal AVM history with associated chronic GI blood loss resulting in iron deficiency and blood loss anemia. She has required blood transfusions in the past in 2013 and in 2018 and frequent hospital admission for anemia. She does not meet the diagnosis criteria for hereditary hemorrhagic telangiectasia. -Her last colonoscopy/endoscopy was 02/2019, recall 2025.  -She is not on oral iron, due to the lack of benefit.  -Currently being treated with IV Iron to prevent significant anemia with goal of Ferritin 100-200 range 

## 2023-04-26 ENCOUNTER — Inpatient Hospital Stay: Payer: 59 | Attending: Hematology | Admitting: Nurse Practitioner

## 2023-04-26 ENCOUNTER — Encounter: Payer: Self-pay | Admitting: Nurse Practitioner

## 2023-04-26 ENCOUNTER — Other Ambulatory Visit: Payer: Self-pay | Admitting: *Deleted

## 2023-04-26 ENCOUNTER — Inpatient Hospital Stay: Payer: 59

## 2023-04-26 VITALS — BP 120/59 | HR 57 | Temp 98.2°F | Resp 17 | Ht 65.0 in | Wt 172.1 lb

## 2023-04-26 DIAGNOSIS — D500 Iron deficiency anemia secondary to blood loss (chronic): Secondary | ICD-10-CM | POA: Insufficient documentation

## 2023-04-26 DIAGNOSIS — K5521 Angiodysplasia of colon with hemorrhage: Secondary | ICD-10-CM | POA: Insufficient documentation

## 2023-04-26 DIAGNOSIS — K5520 Angiodysplasia of colon without hemorrhage: Secondary | ICD-10-CM

## 2023-04-26 DIAGNOSIS — Z452 Encounter for adjustment and management of vascular access device: Secondary | ICD-10-CM | POA: Diagnosis not present

## 2023-04-26 DIAGNOSIS — Q2733 Arteriovenous malformation of digestive system vessel: Secondary | ICD-10-CM | POA: Diagnosis present

## 2023-04-26 DIAGNOSIS — N6315 Unspecified lump in the right breast, overlapping quadrants: Secondary | ICD-10-CM

## 2023-04-26 DIAGNOSIS — R945 Abnormal results of liver function studies: Secondary | ICD-10-CM | POA: Diagnosis not present

## 2023-04-26 LAB — CBC WITH DIFFERENTIAL (CANCER CENTER ONLY)
Abs Immature Granulocytes: 0.02 10*3/uL (ref 0.00–0.07)
Basophils Absolute: 0 10*3/uL (ref 0.0–0.1)
Basophils Relative: 1 %
Eosinophils Absolute: 0.1 10*3/uL (ref 0.0–0.5)
Eosinophils Relative: 2 %
HCT: 37.9 % (ref 36.0–46.0)
Hemoglobin: 10.9 g/dL — ABNORMAL LOW (ref 12.0–15.0)
Immature Granulocytes: 0 %
Lymphocytes Relative: 24 %
Lymphs Abs: 1.7 10*3/uL (ref 0.7–4.0)
MCH: 21.7 pg — ABNORMAL LOW (ref 26.0–34.0)
MCHC: 28.8 g/dL — ABNORMAL LOW (ref 30.0–36.0)
MCV: 75.3 fL — ABNORMAL LOW (ref 80.0–100.0)
Monocytes Absolute: 0.5 10*3/uL (ref 0.1–1.0)
Monocytes Relative: 6 %
Neutro Abs: 4.8 10*3/uL (ref 1.7–7.7)
Neutrophils Relative %: 67 %
Platelet Count: 232 10*3/uL (ref 150–400)
RBC: 5.03 MIL/uL (ref 3.87–5.11)
RDW: 23.5 % — ABNORMAL HIGH (ref 11.5–15.5)
WBC Count: 7.2 10*3/uL (ref 4.0–10.5)
nRBC: 0 % (ref 0.0–0.2)

## 2023-04-26 LAB — CMP (CANCER CENTER ONLY)
ALT: 95 U/L — ABNORMAL HIGH (ref 0–44)
AST: 198 U/L (ref 15–41)
Albumin: 4.1 g/dL (ref 3.5–5.0)
Alkaline Phosphatase: 283 U/L — ABNORMAL HIGH (ref 38–126)
Anion gap: 6 (ref 5–15)
BUN: 19 mg/dL (ref 8–23)
CO2: 26 mmol/L (ref 22–32)
Calcium: 10.5 mg/dL — ABNORMAL HIGH (ref 8.9–10.3)
Chloride: 108 mmol/L (ref 98–111)
Creatinine: 1.67 mg/dL — ABNORMAL HIGH (ref 0.44–1.00)
GFR, Estimated: 32 mL/min — ABNORMAL LOW (ref >60)
Glucose, Bld: 164 mg/dL — ABNORMAL HIGH (ref 70–99)
Potassium: 4.6 mmol/L (ref 3.5–5.1)
Sodium: 140 mmol/L (ref 135–145)
Total Bilirubin: 0.4 mg/dL (ref <1.2)
Total Protein: 7 g/dL (ref 6.5–8.1)

## 2023-04-26 LAB — FERRITIN: Ferritin: 96 ng/mL (ref 11–307)

## 2023-04-26 MED ORDER — HEPARIN SOD (PORK) LOCK FLUSH 100 UNIT/ML IV SOLN
500.0000 [IU] | Freq: Once | INTRAVENOUS | Status: AC
  Administered 2023-04-26: 500 [IU]

## 2023-04-26 MED ORDER — SODIUM CHLORIDE 0.9% FLUSH
10.0000 mL | Freq: Once | INTRAVENOUS | Status: AC
  Administered 2023-04-26: 10 mL

## 2023-04-26 NOTE — Progress Notes (Signed)
Ultrasound of abdomen ordered for further evaluation

## 2023-04-27 ENCOUNTER — Ambulatory Visit: Payer: 59

## 2023-04-27 VITALS — BP 144/75 | HR 59 | Temp 97.9°F | Resp 16 | Ht 65.0 in | Wt 173.8 lb

## 2023-04-27 DIAGNOSIS — D500 Iron deficiency anemia secondary to blood loss (chronic): Secondary | ICD-10-CM

## 2023-04-27 DIAGNOSIS — K922 Gastrointestinal hemorrhage, unspecified: Secondary | ICD-10-CM

## 2023-04-27 MED ORDER — ACETAMINOPHEN 325 MG PO TABS: 650.0000 mg | ORAL_TABLET | Freq: Once | ORAL | Status: DC

## 2023-04-27 MED ORDER — HEPARIN SOD (PORK) LOCK FLUSH 100 UNIT/ML IV SOLN
500.0000 [IU] | Freq: Once | INTRAVENOUS | Status: AC | PRN
  Administered 2023-04-27: 500 [IU]
  Filled 2023-04-27: qty 5

## 2023-04-27 MED ORDER — DIPHENHYDRAMINE HCL 25 MG PO CAPS: 25.0000 mg | ORAL_CAPSULE | Freq: Once | ORAL | Status: DC

## 2023-04-27 MED ORDER — SODIUM CHLORIDE 0.9 % IV SOLN
250.0000 mg | Freq: Once | INTRAVENOUS | Status: AC
  Administered 2023-04-27: 250 mg via INTRAVENOUS
  Filled 2023-04-27: qty 20

## 2023-04-27 NOTE — Progress Notes (Signed)
Diagnosis: Acute Anemia  Provider:  Chilton Greathouse MD  Procedure: IV Infusion  IV Type: Port a Cath, IV Location: R Chest  Ferrlecit (ferric gluconate), Dose: 250 mg  Infusion Start Time: 1431  Infusion Stop Time: 1638  Post Infusion IV Care: Patient declined observation and Port a Cath Deaccessed/Flushed and heparin locked  Discharge: Condition: Good, Destination: Home . AVS Provided  Performed by:  Wyvonne Lenz, RN

## 2023-05-03 ENCOUNTER — Ambulatory Visit (HOSPITAL_COMMUNITY): Payer: 59

## 2023-05-04 ENCOUNTER — Ambulatory Visit (HOSPITAL_COMMUNITY)
Admission: RE | Admit: 2023-05-04 | Discharge: 2023-05-04 | Disposition: A | Discharge status: Home / Self Care | Payer: 59 | Source: Ambulatory Visit | Attending: Nurse Practitioner | Admitting: Nurse Practitioner

## 2023-05-04 DIAGNOSIS — R945 Abnormal results of liver function studies: Secondary | ICD-10-CM | POA: Diagnosis present

## 2023-05-04 DIAGNOSIS — D500 Iron deficiency anemia secondary to blood loss (chronic): Secondary | ICD-10-CM | POA: Diagnosis present

## 2023-05-04 DIAGNOSIS — K5520 Angiodysplasia of colon without hemorrhage: Secondary | ICD-10-CM | POA: Insufficient documentation

## 2023-05-19 ENCOUNTER — Other Ambulatory Visit: Payer: Self-pay

## 2023-05-19 ENCOUNTER — Encounter: Payer: Self-pay | Admitting: Hematology

## 2023-05-19 ENCOUNTER — Inpatient Hospital Stay: Payer: Medicare Other | Admitting: Nurse Practitioner

## 2023-05-19 ENCOUNTER — Ambulatory Visit
Admission: RE | Admit: 2023-05-19 | Discharge: 2023-05-19 | Disposition: A | Discharge status: Home / Self Care | Payer: 59 | Source: Ambulatory Visit | Attending: Emergency Medicine | Admitting: Emergency Medicine

## 2023-05-19 ENCOUNTER — Inpatient Hospital Stay: Payer: Medicare Other | Attending: Hematology

## 2023-05-19 DIAGNOSIS — R918 Other nonspecific abnormal finding of lung field: Secondary | ICD-10-CM

## 2023-05-19 NOTE — Progress Notes (Deleted)
 Patient Care Team: Bethany Cramp, NP as PCP - General (Nurse Practitioner) Bethany [name], Bethany Ned, MD as PCP - Cardiology (Cardiology) Bethany Callander, MD as Consulting Physician (Hematology) Bethany Lonni RAMAN, MD (Inactive) as Consulting Physician (Vascular Surgery) Bethany Maudlin, MD as Referring Physician (Gastroenterology)  Clinic Day:  05/19/2023  Referring physician: Leontine Cramp, NP  ASSESSMENT & PLAN:   Assessment & Plan: No problem-specific Assessment & Plan notes found for this encounter.    The patient understands the plans discussed today and is in agreement with them.  She knows to contact our office if she develops concerns prior to her next appointment.  I provided *** minutes of face-to-face time during this encounter and > 50% was spent counseling as documented under my assessment and plan.    Bethany FORBES Lessen, NP  [name] - A DEPT OF [name] [address] [place] [address] Dept: [phone] Dept Fax: [phone]   No orders of the defined types were placed in this encounter.     CHIEF COMPLAINT:  CC: GI AVM  Current Treatment: IV iron  and PRBCs as needed.  Goal is ferritin level 100-200  INTERVAL HISTORY:  Bethany [name] is here today for repeat clinical assessment.  She last saw myself on 04/26/2023.  Was receiving IV Ferrlecit  at [facility] infusion center.  Last scheduled treatment was 04/27/2023.  Today she is due for routine labs with ferritin level.  Will also recheck liver functions, as they were significantly elevated in October 2024.  Ultrasound of the abdomen was done 05/04/2023.  This showed no acute process.  She is status postcholecystectomy and left nephrectomy.  She denies fevers or chills. She denies pain. Her appetite is good. Her weight {Weight change:10426}.  I have reviewed the past medical history, past surgical history, social history and family history with the  patient and they are unchanged from previous note.  ALLERGIES:  is allergic to contrast media [iodinated contrast media] and other.  MEDICATIONS:  Current Outpatient Medications  Medication Sig Dispense Refill   albuterol  (VENTOLIN  HFA) 108 (90 Base) MCG/ACT inhaler Inhale 1-2 puffs into the lungs every 4 (four) hours as needed for wheezing or shortness of breath.     allopurinol  (ZYLOPRIM ) 100 MG tablet Take 100 mg by mouth at bedtime.     amLODipine  (NORVASC ) 10 MG tablet Take 10 mg by mouth daily.     atorvastatin  (LIPITOR ) 40 MG tablet Take 40 mg by mouth daily.     atorvastatin  (LIPITOR ) 80 MG tablet Take 80 mg by mouth daily.     baclofen  (LIORESAL ) 10 MG tablet Take 10-20 mg by mouth 3 (three) times daily as needed.     docusate sodium  (COLACE) 100 MG capsule Take 100 mg by mouth at bedtime.     Dulaglutide (TRULICITY) 1.5 MG/0.5ML SOPN Inject 1.5 mg into the skin every Monday.      ezetimibe  (ZETIA ) 10 MG tablet Take 10 mg by mouth daily.     famotidine  (PEPCID ) 20 MG tablet Take 20 mg by mouth 2 (two) times daily.     feeding supplement, ENSURE ENLIVE, (ENSURE ENLIVE) LIQD Take 237 mLs by mouth 2 (two) times daily between meals. 237 mL 12   furosemide  (LASIX ) 40 MG tablet Take 40 mg by mouth daily as needed for fluid or edema.     gabapentin  (NEURONTIN ) 100 MG capsule Take 2 capsules (200 mg total) by mouth 3 (three) times  daily.     insulin  glargine, 1 Unit Dial, (TOUJEO  SOLOSTAR) 300 UNIT/ML Solostar Pen Inject 20 Units into the skin at bedtime.      insulin  lispro (HUMALOG) 100 UNIT/ML KwikPen Inject 5 Units into the skin 3 (three) times daily as needed (blood sugar >200).     Ipratropium-Albuterol  (COMBIVENT ) 20-100 MCG/ACT AERS respimat Inhale 1 puff into the lungs every 6 (six) hours as needed for wheezing or shortness of breath. 1 Inhaler 0   JARDIANCE 25 MG TABS tablet Take 25 mg by mouth every morning.     levothyroxine  (SYNTHROID ) 75 MCG tablet Take 75 mcg by mouth daily.      lidocaine -prilocaine  (EMLA ) cream APPLY 2-5 CMS OF CREAM TO PORT SITE 45 MINUTES TO 1 HOUR PRIOR TO ACCESS 30 g 10   losartan  (COZAAR ) 25 MG tablet Take 1 tablet (25 mg total) by mouth daily. 30 tablet 11   magnesium  oxide (MAG-OX) 400 MG tablet Take 1 tablet by mouth daily.     metoprolol  (LOPRESSOR ) 50 MG tablet Take 50 mg by mouth 2 (two) times daily.      NARCAN  4 MG/0.1ML LIQD nasal spray kit Place 1 spray into the nose as needed for opioid reversal.     Omega-3 Fatty Acids (FISH OIL PO) Take 1 capsule by mouth 2 (two) times daily.     Omega-3 Fatty Acids (FISH OIL) 1000 MG CAPS Take 1 capsule by mouth 2 (two) times daily.     oxyCODONE  (OXY IR/ROXICODONE ) 5 MG immediate release tablet Take 1 tablet (5 mg total) by mouth every 6 (six) hours as needed for severe pain or breakthrough pain. 20 tablet 0   oxyCODONE -acetaminophen  (PERCOCET) 10-325 MG tablet Take 1 tablet by mouth every 4 (four) hours as needed.     OZEMPIC, 0.25 OR 0.5 MG/DOSE, 2 MG/3ML SOPN Inject 0.5 mg into the skin once a week.     OZEMPIC, 1 MG/DOSE, 4 MG/3ML SOPN Inject 1 mg into the skin once a week.     pantoprazole  (PROTONIX ) 40 MG tablet Take 1 tablet (40 mg total) by mouth 2 (two) times daily before a meal. 60 tablet 0   polyethylene glycol powder (MIRALAX ) 17 GM/SCOOP powder Take 17 g by mouth 2 (two) times daily as needed for moderate constipation. 255 g 0   senna-docusate (SENOKOT-S) 8.6-50 MG tablet Take 1 tablet by mouth 2 (two) times daily between meals as needed for mild constipation. 60 tablet 0   Tetrahydrozoline HCl (VISINE OP) Place 1 drop into both eyes 3 (three) times daily as needed (dry eyes).     No current facility-administered medications for this visit.    HISTORY OF PRESENT ILLNESS:   Oncology History  Iron  deficiency anemia secondary to blood loss (chronic)      REVIEW OF SYSTEMS:   Constitutional: Denies fevers, chills or abnormal weight loss Eyes: Denies blurriness of vision Ears,  nose, mouth, throat, and face: Denies mucositis or sore throat Respiratory: Denies cough, dyspnea or wheezes Cardiovascular: Denies palpitation, chest discomfort or lower extremity swelling Gastrointestinal:  Denies nausea, heartburn or change in bowel habits Skin: Denies abnormal skin rashes Lymphatics: Denies new lymphadenopathy or easy bruising Neurological:Denies numbness, tingling or new weaknesses Behavioral/Psych: Mood is stable, no new changes  All other systems were reviewed with the patient and are negative.   VITALS:  There were no vitals taken for this visit.  Wt Readings from Last 3 Encounters:  04/27/23 173 lb 12.8 oz (78.8 kg)  04/26/23  172 lb 1.6 oz (78.1 kg)  04/05/23 171 lb 9.6 oz (77.8 kg)    There is no height or weight on file to calculate BMI.  Performance status (ECOG): {CHL ONC [id number]  PHYSICAL EXAM:   GENERAL:alert, no distress and comfortable SKIN: skin color, texture, turgor are normal, no rashes or significant lesions EYES: normal, Conjunctiva are pink and non-injected, sclera clear OROPHARYNX:no exudate, no erythema and lips, buccal mucosa, and tongue normal  NECK: supple, thyroid  normal size, non-tender, without nodularity LYMPH:  no palpable lymphadenopathy in the cervical, axillary or inguinal LUNGS: clear to auscultation and percussion with normal breathing effort HEART: regular rate & rhythm and no murmurs and no lower extremity edema ABDOMEN:abdomen soft, non-tender and normal bowel sounds Musculoskeletal:no cyanosis of digits and no clubbing  NEURO: alert & oriented x 3 with fluent speech, no focal motor/sensory deficits  LABORATORY DATA:  I have reviewed the data as listed    Component Value Date/Time   NA 140 04/26/2023 1113   NA 140 05/17/2017 0855   K 4.6 04/26/2023 1113   K 4.0 05/17/2017 0855   CL 108 04/26/2023 1113   CO2 26 04/26/2023 1113   CO2 24 05/17/2017 0855   GLUCOSE 164 (H) 04/26/2023 1113   GLUCOSE 153 (H)  05/17/2017 0855   BUN 19 04/26/2023 1113   BUN 18.9 05/17/2017 0855   CREATININE 1.67 (H) 04/26/2023 1113   CREATININE 1.2 (H) 05/17/2017 0855   CALCIUM  10.5 (H) 04/26/2023 1113   CALCIUM  10.0 05/17/2017 0855   PROT 7.0 04/26/2023 1113   PROT 6.9 05/17/2017 0855   ALBUMIN  4.1 04/26/2023 1113   ALBUMIN  3.6 05/17/2017 0855   AST 198 (HH) 04/26/2023 1113   AST 15 05/17/2017 0855   ALT 95 (H) 04/26/2023 1113   ALT 16 05/17/2017 0855   ALKPHOS 283 (H) 04/26/2023 1113   ALKPHOS 121 05/17/2017 0855   BILITOT 0.4 04/26/2023 1113   BILITOT <0.22 05/17/2017 0855   GFRNONAA 32 (L) 04/26/2023 1113   GFRAA 39 (L) 02/06/2020 0718   GFRAA 53 (L) 01/24/2020 1212    No results found for: SPEP, UPEP  Lab Results  Component Value Date   WBC 7.2 04/26/2023   NEUTROABS 4.8 04/26/2023   HGB 10.9 (L) 04/26/2023   HCT 37.9 04/26/2023   MCV 75.3 (L) 04/26/2023   PLT 232 04/26/2023      Chemistry      Component Value Date/Time   NA 140 04/26/2023 1113   NA 140 05/17/2017 0855   K 4.6 04/26/2023 1113   K 4.0 05/17/2017 0855   CL 108 04/26/2023 1113   CO2 26 04/26/2023 1113   CO2 24 05/17/2017 0855   BUN 19 04/26/2023 1113   BUN 18.9 05/17/2017 0855   CREATININE 1.67 (H) 04/26/2023 1113   CREATININE 1.2 (H) 05/17/2017 0855      Component Value Date/Time   CALCIUM  10.5 (H) 04/26/2023 1113   CALCIUM  10.0 05/17/2017 0855   ALKPHOS 283 (H) 04/26/2023 1113   ALKPHOS 121 05/17/2017 0855   AST 198 (HH) 04/26/2023 1113   AST 15 05/17/2017 0855   ALT 95 (H) 04/26/2023 1113   ALT 16 05/17/2017 0855   BILITOT 0.4 04/26/2023 1113   BILITOT <0.22 05/17/2017 0855       RADIOGRAPHIC STUDIES: I have personally reviewed the radiological images as listed and agreed with the findings in the report. US  Abdomen Complete Result Date: 05/04/2023 CLINICAL DATA:  Elevated LFTs EXAM: ABDOMEN ULTRASOUND COMPLETE COMPARISON:  CT chest 11/26/2021 FINDINGS: Gallbladder: Surgically absent Common bile  duct: Diameter: 3.9 mm Liver: No focal lesion identified. Within normal limits in parenchymal echogenicity. Portal vein is patent on color Doppler imaging with normal direction of blood flow towards the liver. IVC: No abnormality visualized. Pancreas: Visualized portion unremarkable. Spleen: Size and appearance within normal limits. Right Kidney: Length: 11.5 cm. Normal renal cortical thickness and echogenicity. There is a 2.9 cm cyst. No imaging follow-up needed. Left Kidney: Surgically absent Abdominal aorta: No aneurysm visualized. Other findings: None. IMPRESSION: 1. No acute process. 2. Status post cholecystectomy and left nephrectomy. Electronically Signed   By: Bard Moats M.D.   On: 05/04/2023 10:43

## 2023-05-19 NOTE — Progress Notes (Signed)
 Called patient due to not showing up for her 11 am lab appointment. Patient stated she had an eye docotrs appointment to run over and wasn't able to make. I informed patient we will have scheduling call her. Will no show patient for today.

## 2023-05-20 ENCOUNTER — Encounter: Payer: Self-pay | Admitting: Hematology

## 2023-06-08 ENCOUNTER — Encounter: Payer: Self-pay | Admitting: Hematology

## 2023-06-15 ENCOUNTER — Encounter: Payer: Self-pay | Admitting: Emergency Medicine

## 2023-06-15 ENCOUNTER — Ambulatory Visit: Payer: 59 | Admitting: Emergency Medicine

## 2023-06-15 VITALS — BP 116/67 | HR 65 | Temp 98.7°F | Ht 65.0 in | Wt 176.0 lb

## 2023-06-15 DIAGNOSIS — Z720 Tobacco use: Secondary | ICD-10-CM | POA: Diagnosis not present

## 2023-06-15 DIAGNOSIS — J449 Chronic obstructive pulmonary disease, unspecified: Secondary | ICD-10-CM

## 2023-06-15 DIAGNOSIS — R918 Other nonspecific abnormal finding of lung field: Secondary | ICD-10-CM | POA: Diagnosis not present

## 2023-06-15 NOTE — Progress Notes (Signed)
Subjective:    Patient ID: Bethany [name], female    DOB: 1950-09-19, 73 y.o.   MRN: [id number]  HPI  ROV 12/21/2021 --Bethany [name] is 73 and has a history of tobacco, mild COPD, diabetes, GERD, hypertension, prior CVA, chronic renal insufficiency.  We have been following a groundglass left upper lobe pulmonary nodule originally noted 04/2021, decreased in size on serial imaging.  Most recent scan with a 1.1 cm left upper lobe groundglass nodule, resolution of some other nodules but with a new 4 mm right upper lobe nodule.  We have been following with serial films. Minimal cough. No dyspnea, good functional capacity. She has combivent but never uses it.   Super D CT chest 11/26/2021 reviewed by me shows see stable 1.1 cm left upper lobe groundglass pulmonary nodule that will need extended follow-up.  Additional pulmonary nodules are stable.  There is a new 5 mm inferior lingular nodule present.  ROV 06/15/2023 --follow-up visit for 73 year old woman with a history of tobacco use and mild COPD.  Also with diabetes, GERD, hypertension, chronic renal insufficiency and prior CVA.  We have been following pulmonary nodules on CT scan of the chest.  There is a 1.1 cm stable groundglass left upper lobe nodule originally identified 04/2021.  Also a new 5 mm inferior lingular nodule seen 11/2021.  She reports today that she has decreased her smoking to 3-4 cigarettes daily from 1pk/day. She is breathing well - no changes. Good functional capacity. No real cough. She had been on combivent, was changed to spiriva since I last saw her.   Super D CT chest 05/19/2023 reviewed by me shows centrilobular emphysematous change thin-walled pulmonary cyst in the lateral segment of the right middle lobe 8 mm, stable.  Multiple calcified granulomas.  13 x 14 mm apical left upper lobe groundglass nodule that is stable compared with 05/26/2022 but slightly enlarged going all the way back to 12/23/2020.  6 mm left lower lobe nodule stable  going back to 2022   Review of Systems As per HPi  Past Medical History:  Diagnosis Date   Acute renal failure (HCC)    Anemia    Arthritis    COPD (chronic obstructive pulmonary disease) (HCC)    Diabetes mellitus without complication (HCC)    type II    GERD (gastroesophageal reflux disease)    Hypercalcemia    Hyperlipidemia    Hypertension    Pneumonia    Renal disorder    Single kidney    Stroke [facility])    July 2021   Thrombosis    Tobacco abuse      Family History  Problem Relation Age of Onset   Hypertension Mother    Diabetes Father    Breast cancer Sister    Breast cancer Maternal Aunt        51s   Breast cancer Maternal Aunt 46       colon cancer   Hypertension Brother      Social History   Socioeconomic History   Marital status: Single    Spouse name: Not on file   Number of children: 3   Years of education: Not on file   Highest education level: Not on file  Occupational History   Occupation: retired   Tobacco Use   Smoking status: Every Day    Current packs/day: 0.25    Average packs/day: 0.3 packs/day for 40.0 years (10.0 ttl pk-yrs)    Types: Cigarettes   Smokeless tobacco:  Never   Tobacco comments:    3-4 cigs. Daily AB, CMA 06-15-23  Vaping Use   Vaping status: Never Used  Substance and Sexual Activity   Alcohol use: Yes    Comment: socially   Drug use: No   Sexual activity: Not on file  Other Topics Concern   Not on file  Social History Narrative   Lives with daughter    [place] from [place]    Social Drivers of Health   Financial Resource Strain: Not on file  Food Insecurity: Not on file  Transportation Needs: Not on file  Physical Activity: Not on file  Stress: Not on file  Social Connections: Not on file  Intimate Partner Violence: Not on file     Allergies  Allergen Reactions   Contrast Media [Iodinated Contrast Media]     Renal hypertension per physician   Other Other (See Comments)    No ASA/Anti-Coag due to GI bleed      Outpatient Medications Prior to Visit  Medication Sig Dispense Refill   albuterol (VENTOLIN HFA) 108 (90 Base) MCG/ACT inhaler Inhale 1-2 puffs into the lungs every 4 (four) hours as needed for wheezing or shortness of breath.     allopurinol (ZYLOPRIM) 100 MG tablet Take 100 mg by mouth at bedtime.     amLODipine (NORVASC) 10 MG tablet Take 10 mg by mouth daily.     atorvastatin (LIPITOR) 40 MG tablet Take 40 mg by mouth daily.     atorvastatin (LIPITOR) 80 MG tablet Take 80 mg by mouth daily.     docusate sodium (COLACE) 100 MG capsule Take 100 mg by mouth at bedtime.     ezetimibe (ZETIA) 10 MG tablet Take 10 mg by mouth daily.     famotidine (PEPCID) 20 MG tablet Take 20 mg by mouth 2 (two) times daily.     furosemide (LASIX) 40 MG tablet Take 40 mg by mouth daily as needed for fluid or edema.     gabapentin (NEURONTIN) 100 MG capsule Take 2 capsules (200 mg total) by mouth 3 (three) times daily.     insulin glargine, 1 Unit Dial, (TOUJEO SOLOSTAR) 300 UNIT/ML Solostar Pen Inject 20 Units into the skin at bedtime.      insulin lispro (HUMALOG) 100 UNIT/ML KwikPen Inject 5 Units into the skin 3 (three) times daily as needed (blood sugar >200).     Ipratropium-Albuterol (COMBIVENT) 20-100 MCG/ACT AERS respimat Inhale 1 puff into the lungs every 6 (six) hours as needed for wheezing or shortness of breath. 1 Inhaler 0   JARDIANCE 25 MG TABS tablet Take 25 mg by mouth every morning.     levothyroxine (SYNTHROID) 75 MCG tablet Take 75 mcg by mouth daily.     lidocaine-prilocaine (EMLA) cream APPLY 2-5 CMS OF CREAM TO PORT SITE 45 MINUTES TO 1 HOUR PRIOR TO ACCESS 30 g 10   magnesium oxide (MAG-OX) 400 MG tablet Take 1 tablet by mouth daily.     metoprolol (LOPRESSOR) 50 MG tablet Take 50 mg by mouth 2 (two) times daily.      NARCAN 4 MG/0.1ML LIQD nasal spray kit Place 1 spray into the nose as needed for opioid reversal.     Omega-3 Fatty Acids (FISH OIL PO) Take 1 capsule by mouth 2 (two)  times daily.     Omega-3 Fatty Acids (FISH OIL) 1000 MG CAPS Take 1 capsule by mouth 2 (two) times daily.     oxyCODONE-acetaminophen (PERCOCET) 10-325 MG tablet Take  1 tablet by mouth every 4 (four) hours as needed.     OZEMPIC, 0.25 OR 0.5 MG/DOSE, 2 MG/3ML SOPN Inject 0.5 mg into the skin once a week.     OZEMPIC, 1 MG/DOSE, 4 MG/3ML SOPN Inject 1 mg into the skin once a week.     pantoprazole (PROTONIX) 40 MG tablet Take 1 tablet (40 mg total) by mouth 2 (two) times daily before a meal. 60 tablet 0   polyethylene glycol powder (MIRALAX) 17 GM/SCOOP powder Take 17 g by mouth 2 (two) times daily as needed for moderate constipation. 255 g 0   senna-docusate (SENOKOT-S) 8.6-50 MG tablet Take 1 tablet by mouth 2 (two) times daily between meals as needed for mild constipation. 60 tablet 0   Tetrahydrozoline HCl (VISINE OP) Place 1 drop into both eyes 3 (three) times daily as needed (dry eyes).     losartan (COZAAR) 25 MG tablet Take 1 tablet (25 mg total) by mouth daily. 30 tablet 11   baclofen (LIORESAL) 10 MG tablet Take 10-20 mg by mouth 3 (three) times daily as needed. (Patient not taking: Reported on 06/15/2023)     Dulaglutide (TRULICITY) 1.5 MG/0.5ML SOPN Inject 1.5 mg into the skin every Monday.  (Patient not taking: Reported on 06/15/2023)     feeding supplement, ENSURE ENLIVE, (ENSURE ENLIVE) LIQD Take 237 mLs by mouth 2 (two) times daily between meals. (Patient not taking: Reported on 06/15/2023) 237 mL 12   oxyCODONE (OXY IR/ROXICODONE) 5 MG immediate release tablet Take 1 tablet (5 mg total) by mouth every 6 (six) hours as needed for severe pain or breakthrough pain. (Patient not taking: Reported on 06/15/2023) 20 tablet 0   No facility-administered medications prior to visit.          Objective:   Physical Exam Vitals:   06/15/23 1559  BP: 116/67  Pulse: 65  Temp: 98.7 F (37.1 C)  TempSrc: Oral  SpO2: 96%  Weight: 176 lb (79.8 kg)  Height: 5\' 5"  (1.651 m)    Gen:  Pleasant, well-nourished, in no distress,  normal affect  ENT: No lesions,  mouth clear,  oropharynx clear, no postnasal drip  Neck: No JVD, no stridor  Lungs: No use of accessory muscles, no crackles or wheezing on normal respiration, no wheeze on forced expiration  Cardiovascular: RRR, heart sounds normal, no murmur or gallops, no peripheral edema  Musculoskeletal: No deformities, no cyanosis or clubbing  Neuro: alert, awake, non focal  Skin: Warm, no lesions or rash      Assessment & Plan:  Multiple lung nodules We reviewed your CT scan of the chest today. We will plan to repeat your CT in January 2026.  Depending on that result we will determine whether any other evaluation of your pulmonary nodules is necessary Follow Dr. Delton Coombes in January 2026 after your CT so we can review those results together.  COPD (chronic obstructive pulmonary disease) (HCC) Please continue Spiriva once daily. Keep albuterol available to use 2 puffs when needed for shortness of breath, chest tightness, wheezing.  Tobacco use Congratulations on decreasing your cigarettes.  Our goal will be to help you stop completely    Levy Pupa, MD, PhD 06/15/2023, 4:56 PM [facility] [phone] or if no answer before 7:00PM call [phone] For any issues after 7:00PM please call eLink [phone]

## 2023-06-15 NOTE — Assessment & Plan Note (Signed)
We reviewed your CT scan of the chest today. We will plan to repeat your CT in January 2026.  Depending on that result we will determine whether any other evaluation of your pulmonary nodules is necessary Follow Dr. Delton Coombes in January 2026 after your CT so we can review those results together.

## 2023-06-15 NOTE — Assessment & Plan Note (Signed)
Please continue Spiriva once daily. Keep albuterol available to use 2 puffs when needed for shortness of breath, chest tightness, wheezing.

## 2023-06-15 NOTE — Patient Instructions (Signed)
We reviewed your CT scan of the chest today. We will plan to repeat your CT in January 2026.  Depending on that result we will determine whether any other evaluation of your pulmonary nodules is necessary Please continue Spiriva once daily. Keep albuterol available to use 2 puffs when needed for shortness of breath, chest tightness, wheezing. Congratulations on decreasing your cigarettes.  Our goal will be to help you stop completely Follow Dr. Delton Coombes in January 2026 after your CT so we can review those results together.

## 2023-06-15 NOTE — Assessment & Plan Note (Signed)
Congratulations on decreasing your cigarettes.  Our goal will be to help you stop completely

## 2023-06-27 ENCOUNTER — Telehealth: Payer: Self-pay | Admitting: Hematology

## 2023-06-27 NOTE — Telephone Encounter (Signed)
Marland Kitchen

## 2023-07-03 ENCOUNTER — Other Ambulatory Visit: Payer: Self-pay | Admitting: Hematology

## 2023-07-03 DIAGNOSIS — Z1231 Encounter for screening mammogram for malignant neoplasm of breast: Secondary | ICD-10-CM

## 2023-07-03 NOTE — Assessment & Plan Note (Signed)
--  Pt has intestinal AVM history with associated chronic GI blood loss resulting in iron deficiency and blood loss anemia. She has required blood transfusions in the past in 2013 and in 2018 and frequent hospital admission for anemia. She does not meet the diagnosis criteria for hereditary hemorrhagic telangiectasia. -Her last colonoscopy/endoscopy was 02/2019, recall 2025.  -She is not on oral iron, due to the lack of benefit.  -Currently being treated with IV Iron to prevent significant anemia with goal of Ferritin 100-200 range 

## 2023-07-03 NOTE — Assessment & Plan Note (Signed)
h/o smoking intermittently for 40 years, then increased to daily after her mother passed in 2021.  -nodules initially seen on screening chest CT 07/12/19.  -Repeat 07/13/20 showed new nodules. PET scan on 07/28/20 showed mild hypermetabolism to dominant LUL nodule. Biopsy of left lung was attempted but held given difficult location. -she is now followed by Dr. Delton Coombes. Most recent CT 05/2022 showed stable lung nodules  -She continues to smoke. I again encouraged her to quit smoking.

## 2023-07-04 ENCOUNTER — Encounter: Payer: Self-pay | Admitting: Hematology

## 2023-07-04 ENCOUNTER — Other Ambulatory Visit: Payer: Self-pay | Admitting: Registered Nurse

## 2023-07-04 ENCOUNTER — Ambulatory Visit
Admission: RE | Admit: 2023-07-04 | Discharge: 2023-07-04 | Disposition: A | Discharge status: Home / Self Care | Payer: 59 | Source: Ambulatory Visit | Attending: Registered Nurse | Admitting: Registered Nurse

## 2023-07-04 ENCOUNTER — Inpatient Hospital Stay (HOSPITAL_BASED_OUTPATIENT_CLINIC_OR_DEPARTMENT_OTHER): Payer: 59 | Admitting: Hematology

## 2023-07-04 ENCOUNTER — Inpatient Hospital Stay: Payer: 59 | Attending: Hematology

## 2023-07-04 VITALS — BP 132/50 | HR 67 | Temp 97.2°F | Resp 18 | Wt 179.1 lb

## 2023-07-04 DIAGNOSIS — K219 Gastro-esophageal reflux disease without esophagitis: Secondary | ICD-10-CM | POA: Diagnosis not present

## 2023-07-04 DIAGNOSIS — M25561 Pain in right knee: Secondary | ICD-10-CM

## 2023-07-04 DIAGNOSIS — D500 Iron deficiency anemia secondary to blood loss (chronic): Secondary | ICD-10-CM

## 2023-07-04 DIAGNOSIS — M25469 Effusion, unspecified knee: Secondary | ICD-10-CM | POA: Diagnosis not present

## 2023-07-04 DIAGNOSIS — Z452 Encounter for adjustment and management of vascular access device: Secondary | ICD-10-CM | POA: Diagnosis not present

## 2023-07-04 DIAGNOSIS — K5521 Angiodysplasia of colon with hemorrhage: Secondary | ICD-10-CM | POA: Diagnosis present

## 2023-07-04 DIAGNOSIS — E119 Type 2 diabetes mellitus without complications: Secondary | ICD-10-CM | POA: Insufficient documentation

## 2023-07-04 DIAGNOSIS — Q2733 Arteriovenous malformation of digestive system vessel: Secondary | ICD-10-CM | POA: Insufficient documentation

## 2023-07-04 DIAGNOSIS — R918 Other nonspecific abnormal finding of lung field: Secondary | ICD-10-CM | POA: Diagnosis not present

## 2023-07-04 DIAGNOSIS — R945 Abnormal results of liver function studies: Secondary | ICD-10-CM

## 2023-07-04 DIAGNOSIS — R911 Solitary pulmonary nodule: Secondary | ICD-10-CM | POA: Insufficient documentation

## 2023-07-04 DIAGNOSIS — K5520 Angiodysplasia of colon without hemorrhage: Secondary | ICD-10-CM

## 2023-07-04 LAB — CBC WITH DIFFERENTIAL (CANCER CENTER ONLY)
Abs Immature Granulocytes: 0.05 10*3/uL (ref 0.00–0.07)
Basophils Absolute: 0 10*3/uL (ref 0.0–0.1)
Basophils Relative: 0 %
Eosinophils Absolute: 0.1 10*3/uL (ref 0.0–0.5)
Eosinophils Relative: 2 %
HCT: 34.1 % — ABNORMAL LOW (ref 36.0–46.0)
Hemoglobin: 10.2 g/dL — ABNORMAL LOW (ref 12.0–15.0)
Immature Granulocytes: 1 %
Lymphocytes Relative: 16 %
Lymphs Abs: 1.2 10*3/uL (ref 0.7–4.0)
MCH: 22.5 pg — ABNORMAL LOW (ref 26.0–34.0)
MCHC: 29.9 g/dL — ABNORMAL LOW (ref 30.0–36.0)
MCV: 75.3 fL — ABNORMAL LOW (ref 80.0–100.0)
Monocytes Absolute: 0.6 10*3/uL (ref 0.1–1.0)
Monocytes Relative: 7 %
Neutro Abs: 5.9 10*3/uL (ref 1.7–7.7)
Neutrophils Relative %: 74 %
Platelet Count: 225 10*3/uL (ref 150–400)
RBC: 4.53 MIL/uL (ref 3.87–5.11)
RDW: 17.8 % — ABNORMAL HIGH (ref 11.5–15.5)
WBC Count: 7.9 10*3/uL (ref 4.0–10.5)
nRBC: 0 % (ref 0.0–0.2)

## 2023-07-04 LAB — FERRITIN: Ferritin: 30 ng/mL (ref 11–307)

## 2023-07-04 LAB — CMP (CANCER CENTER ONLY)
ALT: 42 U/L (ref 0–44)
AST: 37 U/L (ref 15–41)
Albumin: 3.9 g/dL (ref 3.5–5.0)
Alkaline Phosphatase: 194 U/L — ABNORMAL HIGH (ref 38–126)
Anion gap: 5 (ref 5–15)
BUN: 23 mg/dL (ref 8–23)
CO2: 25 mmol/L (ref 22–32)
Calcium: 9.6 mg/dL (ref 8.9–10.3)
Chloride: 108 mmol/L (ref 98–111)
Creatinine: 1.17 mg/dL — ABNORMAL HIGH (ref 0.44–1.00)
GFR, Estimated: 49 mL/min — ABNORMAL LOW (ref >60)
Glucose, Bld: 231 mg/dL — ABNORMAL HIGH (ref 70–99)
Potassium: 4 mmol/L (ref 3.5–5.1)
Sodium: 138 mmol/L (ref 135–145)
Total Bilirubin: 0.3 mg/dL (ref 0.0–1.2)
Total Protein: 6.3 g/dL — ABNORMAL LOW (ref 6.5–8.1)

## 2023-07-04 MED ORDER — SODIUM CHLORIDE 0.9% FLUSH
10.0000 mL | Freq: Once | INTRAVENOUS | Status: AC
  Administered 2023-07-04: 10 mL

## 2023-07-04 MED ORDER — HEPARIN SOD (PORK) LOCK FLUSH 100 UNIT/ML IV SOLN
500.0000 [IU] | Freq: Once | INTRAVENOUS | Status: AC
  Administered 2023-07-04: 500 [IU]

## 2023-07-04 NOTE — Progress Notes (Signed)
[facility]   [phone] [phone]   Clinic Follow up Note   Patient Care Team: [name], NP as PCP - General (Nurse Practitioner) Flora Lipps, Ronnald Ramp, MD as PCP - Cardiology (Cardiology) Malachy Mood, MD as Consulting Physician (Hematology) Chuck Hint, MD (Inactive) as Consulting Physician (Vascular Surgery) Eleanora Neighbor, MD as Referring Physician (Gastroenterology)  Date of Service:  07/04/2023  CHIEF COMPLAINT: f/u of IDA  CURRENT THERAPY:  IV iron as needed if ferritin less than 20  Oncology History   Iron deficiency anemia secondary to blood loss (chronic) -Pt has intestinal AVM history with associated chronic GI blood loss resulting in iron deficiency and blood loss anemia. She has required blood transfusions in the past in 2013 and in 2018 and frequent hospital admission for anemia. She does not meet the diagnosis criteria for hereditary hemorrhagic telangiectasia. -Her last colonoscopy/endoscopy was 02/2019, recall 2025.  -She is not on oral iron, due to the lack of benefit.  -Currently being treated with IV Iron to prevent significant anemia with goal of Ferritin 100-200 range    Multiple lung nodules h/o smoking intermittently for 40 years, then increased to daily after her mother passed in 2021.  -nodules initially seen on screening chest CT 07/12/19.  -Repeat 07/13/20 showed new nodules. PET scan on 07/28/20 showed mild hypermetabolism to dominant LUL nodule. Biopsy of left lung was attempted but held given difficult location. -she is now followed by Dr. Delton Coombes. Most recent CT 05/2022 showed stable lung nodules  -She continues to smoke. I again encouraged her to quit smoking.     Assessment and Plan    Anemia Chronic anemia, well-managed. Hemoglobin was 10.9 two months ago. Asymptomatic for low iron. Last IV iron administered in November and December. No recent transfusions needed. Discussed IV iron's role in  preventing transfusions. Instructed to report symptoms like melena, severe fatigue, or pallor immediately. - Monitor CBC every eight weeks - Administer IV iron if indicated - Instruct to report symptoms like melena, severe fatigue, or pallor immediately  Diabetes Mellitus Poor glycemic control with recent blood sugar level of 294. Difficulty managing blood sugar despite dietary efforts. Emphasized strict dietary adherence and regular blood sugar monitoring. - Continue blood sugar monitoring - Reinforce dietary management and adherence to diabetic guidelines - Evaluate and adjust diabetic medications if necessary  Lung Nodule Stable lung nodule under surveillance by Dr. Ortencia Kick. January CT scan showed no changes. Follow-up recommended for three years. No immediate intervention needed. - Continue follow-up with Dr. Ortencia Kick as scheduled  Knee Pain Severe knee pain and swelling, likely due to arthritis. Awaiting x-ray results from primary care physician. X-ray results will guide further management, including potential orthopedic referral. - Await x-ray results from primary care physician - Consider orthopedic referral if necessary  Gastroesophageal Reflux Disease (GERD) Experiencing postprandial tightness and pain, relieved by eructation. Recent endoscopy normal except for minor gastric irritation. Symptoms likely due to acid reflux. Monitor for changes. - Monitor symptoms - Consider further evaluation if symptoms persist  General Health Maintenance Liver function normalized. Recent liver ultrasound unremarkable. Status post cholecystectomy and nephrectomy. Emphasized importance of routine health maintenance. - Continue routine health maintenance - Schedule follow-up appointments as needed  Plan -Lab reviewed, hemoglobin 10.2 today, iron study pending - Schedule follow-up visit in six months - Schedule lab work every eight weeks.       Discussed the use of AI scribe software for clinical  note transcription with the patient, who gave  verbal consent to proceed.  History of Present Illness   Bethany [name], a 73 year old female with a history of anemia and a lung nodule, presents for a follow-up visit. She reports experiencing severe knee pain and swelling, which she is currently getting evaluated by her primary care physician. She also mentions a feeling of tightness in her chest when she eats or drinks, which only relieves after she burps. She recently had an endoscopy, which came back normal, but she continues to experience the same symptoms. She also mentions that she had an ultrasound for her liver due to changes in her liver function, but the results showed no issues. She is currently being monitored for her lung nodule, which has remained stable.         All other systems were reviewed with the patient and are negative.  MEDICAL HISTORY:  Past Medical History:  Diagnosis Date   Acute renal failure (HCC)    Anemia    Arthritis    COPD (chronic obstructive pulmonary disease) (HCC)    Diabetes mellitus without complication (HCC)    type II    GERD (gastroesophageal reflux disease)    Hypercalcemia    Hyperlipidemia    Hypertension    Pneumonia    Renal disorder    Single kidney    Stroke [facility])    July 2021   Thrombosis    Tobacco abuse     SURGICAL HISTORY: Past Surgical History:  Procedure Laterality Date   ABDOMINAL HYSTERECTOMY     blood clots removed from descending aorta      BREAST BIOPSY Right 07/28/2021   BREAST BIOPSY Right 08/17/2021   BREAST LUMPECTOMY Right 01/25/2021   BREAST LUMPECTOMY WITH RADIOACTIVE SEED LOCALIZATION Right 01/25/2021   Procedure: RIGHT BREAST LUMPECTOMY WITH RADIOACTIVE SEED LOCALIZATION;  Surgeon: Abigail Miyamoto, MD;  Location: [facility];  Service: General;  Laterality: Right;   CHOLECYSTECTOMY     COLONOSCOPY     COLONOSCOPY WITH PROPOFOL N/A 04/17/2017   Procedure: COLONOSCOPY WITH PROPOFOL;  Surgeon:  Charlott Rakes, MD;  Location: [facility];  Service: Endoscopy;  Laterality: N/A;   ESOPHAGOGASTRODUODENOSCOPY (EGD) WITH PROPOFOL N/A 04/17/2017   Procedure: ESOPHAGOGASTRODUODENOSCOPY (EGD) WITH PROPOFOL;  Surgeon: Charlott Rakes, MD;  Location: [facility];  Service: Endoscopy;  Laterality: N/A;   IR 3D INDEPENDENT WKST  05/04/2020   IR ANGIO INTRA EXTRACRAN SEL COM CAROTID INNOMINATE BILAT MOD SED  02/06/2020   IR ANGIO INTRA EXTRACRAN SEL INTERNAL CAROTID UNI L MOD SED  05/04/2020   IR ANGIO VERTEBRAL SEL VERTEBRAL BILAT MOD SED  02/06/2020   IR ANGIOGRAM FOLLOW UP STUDY  05/04/2020   IR IMAGING GUIDED PORT INSERTION  05/01/2019   IR NEURO EACH ADD'L AFTER BASIC UNI LEFT (MS)  05/04/2020   IR RADIOLOGIST EVAL & MGMT  05/28/2020   IR TRANSCATH/EMBOLIZ  05/04/2020   IR US GUIDE VASC ACCESS RIGHT  02/06/2020   IR US GUIDE VASC ACCESS RIGHT  05/04/2020   LESION REMOVAL Left 01/25/2021   Procedure: ABNORMAL SKIN LESION REMOVAL LEFT ABDOMINAL WALL;  Surgeon: Abigail Miyamoto, MD;  Location: [facility];  Service: General;  Laterality: Left;   NEPHRECTOMY RECIPIENT     ORIF ANKLE FRACTURE N/A 07/13/2021   Procedure: OPEN REDUCTION INTERNAL FIXATION (ORIF) ANKLE FRACTURE;  Surgeon: Ernestina Columbia, MD;  Location: [facility];  Service: Orthopedics;  Laterality: N/A;   RADIOLOGY WITH ANESTHESIA N/A 05/04/2020   Procedure: Vertis Kelch;  Surgeon: Julieanne Cotton, MD;  Location: [facility];  Service: Radiology;  Laterality: N/A;    I have reviewed the social history and family history with the patient and they are unchanged from previous note.  ALLERGIES:  is allergic to contrast media [iodinated contrast media] and other.  MEDICATIONS:  Current Outpatient Medications  Medication Sig Dispense Refill   albuterol (VENTOLIN HFA) 108 (90 Base) MCG/ACT inhaler Inhale 1-2 puffs into the lungs every 4 (four) hours as needed for wheezing or shortness of breath.     allopurinol  (ZYLOPRIM) 100 MG tablet Take 100 mg by mouth at bedtime.     amLODipine (NORVASC) 10 MG tablet Take 10 mg by mouth daily.     atorvastatin (LIPITOR) 40 MG tablet Take 40 mg by mouth daily.     atorvastatin (LIPITOR) 80 MG tablet Take 80 mg by mouth daily.     docusate sodium (COLACE) 100 MG capsule Take 100 mg by mouth at bedtime.     ezetimibe (ZETIA) 10 MG tablet Take 10 mg by mouth daily.     famotidine (PEPCID) 20 MG tablet Take 20 mg by mouth 2 (two) times daily.     furosemide (LASIX) 40 MG tablet Take 40 mg by mouth daily as needed for fluid or edema.     gabapentin (NEURONTIN) 100 MG capsule Take 2 capsules (200 mg total) by mouth 3 (three) times daily.     insulin glargine, 1 Unit Dial, (TOUJEO SOLOSTAR) 300 UNIT/ML Solostar Pen Inject 20 Units into the skin at bedtime.      insulin lispro (HUMALOG) 100 UNIT/ML KwikPen Inject 5 Units into the skin 3 (three) times daily as needed (blood sugar >200).     Ipratropium-Albuterol (COMBIVENT) 20-100 MCG/ACT AERS respimat Inhale 1 puff into the lungs every 6 (six) hours as needed for wheezing or shortness of breath. 1 Inhaler 0   JARDIANCE 25 MG TABS tablet Take 25 mg by mouth every morning.     levothyroxine (SYNTHROID) 75 MCG tablet Take 75 mcg by mouth daily.     lidocaine-prilocaine (EMLA) cream APPLY 2-5 CMS OF CREAM TO PORT SITE 45 MINUTES TO 1 HOUR PRIOR TO ACCESS 30 g 10   losartan (COZAAR) 25 MG tablet Take 1 tablet (25 mg total) by mouth daily. 30 tablet 11   magnesium oxide (MAG-OX) 400 MG tablet Take 1 tablet by mouth daily.     metoprolol (LOPRESSOR) 50 MG tablet Take 50 mg by mouth 2 (two) times daily.      NARCAN 4 MG/0.1ML LIQD nasal spray kit Place 1 spray into the nose as needed for opioid reversal.     Omega-3 Fatty Acids (FISH OIL PO) Take 1 capsule by mouth 2 (two) times daily.     Omega-3 Fatty Acids (FISH OIL) 1000 MG CAPS Take 1 capsule by mouth 2 (two) times daily.     oxyCODONE-acetaminophen (PERCOCET) 10-325 MG  tablet Take 1 tablet by mouth every 4 (four) hours as needed.     OZEMPIC, 0.25 OR 0.5 MG/DOSE, 2 MG/3ML SOPN Inject 0.5 mg into the skin once a week.     OZEMPIC, 1 MG/DOSE, 4 MG/3ML SOPN Inject 1 mg into the skin once a week.     pantoprazole (PROTONIX) 40 MG tablet Take 1 tablet (40 mg total) by mouth 2 (two) times daily before a meal. 60 tablet 0   polyethylene glycol powder (MIRALAX) 17 GM/SCOOP powder Take 17 g by mouth 2 (two) times daily as needed for moderate constipation. 255 g 0   senna-docusate (SENOKOT-S)  8.6-50 MG tablet Take 1 tablet by mouth 2 (two) times daily between meals as needed for mild constipation. 60 tablet 0   Tetrahydrozoline HCl (VISINE OP) Place 1 drop into both eyes 3 (three) times daily as needed (dry eyes).     No current facility-administered medications for this visit.    PHYSICAL EXAMINATION: ECOG PERFORMANCE STATUS: 1 - Symptomatic but completely ambulatory  Vitals:   07/04/23 1318  BP: (!) 132/50  Pulse: 67  Resp: 18  Temp: (!) 97.2 F (36.2 C)  SpO2: 100%   Wt Readings from Last 3 Encounters:  07/04/23 179 lb 1.6 oz (81.2 kg)  06/15/23 176 lb (79.8 kg)  04/27/23 173 lb 12.8 oz (78.8 kg)     GENERAL:alert, no distress and comfortable SKIN: skin color, texture, turgor are normal, no rashes or significant lesions EYES: normal, Conjunctiva are pink and non-injected, sclera clear NECK: supple, thyroid normal size, non-tender, without nodularity LYMPH:  no palpable lymphadenopathy in the cervical, axillary  LUNGS: clear to auscultation and percussion with normal breathing effort HEART: regular rate & rhythm and no murmurs and no lower extremity edema ABDOMEN:abdomen soft, non-tender and normal bowel sounds Musculoskeletal:no cyanosis of digits and no clubbing  NEURO: alert & oriented x 3 with fluent speech, no focal motor/sensory deficits   LABORATORY DATA:  I have reviewed the data as listed    Latest Ref Rng & Units 07/04/2023   12:47 PM  04/26/2023   11:13 AM 03/01/2023   12:32 PM  CBC  WBC 4.0 - 10.5 K/uL 7.9  7.2  6.9   Hemoglobin 12.0 - 15.0 g/dL 96.0  45.4  8.7   Hematocrit 36.0 - 46.0 % 34.1  37.9  29.5   Platelets 150 - 400 K/uL 225  232  264         Latest Ref Rng & Units 07/04/2023   12:47 PM 04/26/2023   11:13 AM 03/01/2023   12:32 PM  CMP  Glucose 70 - 99 mg/dL 098  119  147   BUN 8 - 23 mg/dL 23  19  16    Creatinine 0.44 - 1.00 mg/dL 8.29  5.62  1.30   Sodium 135 - 145 mmol/L 138  140  137   Potassium 3.5 - 5.1 mmol/L 4.0  4.6  4.5   Chloride 98 - 111 mmol/L 108  108  104   CO2 22 - 32 mmol/L 25  26  27    Calcium 8.9 - 10.3 mg/dL 9.6  86.5  78.4   Total Protein 6.5 - 8.1 g/dL 6.3  7.0  6.8   Total Bilirubin 0.0 - 1.2 mg/dL 0.3  0.4  0.5   Alkaline Phos 38 - 126 U/L 194  283  131   AST 15 - 41 U/L 37  198  14   ALT 0 - 44 U/L 42  95  18       RADIOGRAPHIC STUDIES: I have personally reviewed the radiological images as listed and agreed with the findings in the report. No results found.    No orders of the defined types were placed in this encounter.  All questions were answered. The patient knows to call the clinic with any problems, questions or concerns. No barriers to learning was detected. The total time spent in the appointment was 20 minutes.     Malachy Mood, MD 07/04/2023

## 2023-07-06 ENCOUNTER — Encounter: Payer: Self-pay | Admitting: Hematology

## 2023-08-04 ENCOUNTER — Encounter: Payer: Self-pay | Admitting: Hematology

## 2023-08-04 ENCOUNTER — Ambulatory Visit
Admission: RE | Admit: 2023-08-04 | Discharge: 2023-08-04 | Disposition: A | Discharge status: Home / Self Care | Payer: 59 | Source: Ambulatory Visit | Attending: Hematology | Admitting: Hematology

## 2023-08-04 DIAGNOSIS — Z1231 Encounter for screening mammogram for malignant neoplasm of breast: Secondary | ICD-10-CM

## 2023-08-22 ENCOUNTER — Encounter: Payer: Self-pay | Admitting: Hematology

## 2023-08-29 ENCOUNTER — Inpatient Hospital Stay: Payer: Medicare Other | Attending: Hematology

## 2023-08-29 DIAGNOSIS — K5520 Angiodysplasia of colon without hemorrhage: Secondary | ICD-10-CM

## 2023-08-29 DIAGNOSIS — K5521 Angiodysplasia of colon with hemorrhage: Secondary | ICD-10-CM | POA: Diagnosis present

## 2023-08-29 DIAGNOSIS — D500 Iron deficiency anemia secondary to blood loss (chronic): Secondary | ICD-10-CM | POA: Insufficient documentation

## 2023-08-29 DIAGNOSIS — Z452 Encounter for adjustment and management of vascular access device: Secondary | ICD-10-CM | POA: Diagnosis not present

## 2023-08-29 DIAGNOSIS — Q2733 Arteriovenous malformation of digestive system vessel: Secondary | ICD-10-CM | POA: Diagnosis present

## 2023-08-29 LAB — CMP (CANCER CENTER ONLY)
ALT: 30 U/L (ref 0–44)
AST: 30 U/L (ref 15–41)
Albumin: 4.1 g/dL (ref 3.5–5.0)
Alkaline Phosphatase: 167 U/L — ABNORMAL HIGH (ref 38–126)
Anion gap: 5 (ref 5–15)
BUN: 23 mg/dL (ref 8–23)
CO2: 27 mmol/L (ref 22–32)
Calcium: 10 mg/dL (ref 8.9–10.3)
Chloride: 105 mmol/L (ref 98–111)
Creatinine: 1.34 mg/dL — ABNORMAL HIGH (ref 0.44–1.00)
GFR, Estimated: 42 mL/min — ABNORMAL LOW (ref >60)
Glucose, Bld: 226 mg/dL — ABNORMAL HIGH (ref 70–99)
Potassium: 4.7 mmol/L (ref 3.5–5.1)
Sodium: 137 mmol/L (ref 135–145)
Total Bilirubin: 0.3 mg/dL (ref 0.0–1.2)
Total Protein: 7 g/dL (ref 6.5–8.1)

## 2023-08-29 LAB — CBC WITH DIFFERENTIAL (CANCER CENTER ONLY)
Abs Immature Granulocytes: 0.04 10*3/uL (ref 0.00–0.07)
Basophils Absolute: 0 10*3/uL (ref 0.0–0.1)
Basophils Relative: 0 %
Eosinophils Absolute: 0.1 10*3/uL (ref 0.0–0.5)
Eosinophils Relative: 2 %
HCT: 32.6 % — ABNORMAL LOW (ref 36.0–46.0)
Hemoglobin: 9.5 g/dL — ABNORMAL LOW (ref 12.0–15.0)
Immature Granulocytes: 1 %
Lymphocytes Relative: 17 %
Lymphs Abs: 1.4 10*3/uL (ref 0.7–4.0)
MCH: 21.3 pg — ABNORMAL LOW (ref 26.0–34.0)
MCHC: 29.1 g/dL — ABNORMAL LOW (ref 30.0–36.0)
MCV: 73.1 fL — ABNORMAL LOW (ref 80.0–100.0)
Monocytes Absolute: 0.7 10*3/uL (ref 0.1–1.0)
Monocytes Relative: 8 %
Neutro Abs: 6.4 10*3/uL (ref 1.7–7.7)
Neutrophils Relative %: 72 %
Platelet Count: 235 10*3/uL (ref 150–400)
RBC: 4.46 MIL/uL (ref 3.87–5.11)
RDW: 18.5 % — ABNORMAL HIGH (ref 11.5–15.5)
WBC Count: 8.7 10*3/uL (ref 4.0–10.5)
nRBC: 0.2 % (ref 0.0–0.2)

## 2023-08-29 MED ORDER — SODIUM CHLORIDE 0.9% FLUSH
10.0000 mL | Freq: Once | INTRAVENOUS | Status: AC
  Administered 2023-08-29: 10 mL

## 2023-08-29 MED ORDER — HEPARIN SOD (PORK) LOCK FLUSH 100 UNIT/ML IV SOLN
500.0000 [IU] | Freq: Once | INTRAVENOUS | Status: AC
  Administered 2023-08-29: 500 [IU]

## 2023-10-24 ENCOUNTER — Telehealth: Payer: Self-pay

## 2023-10-24 ENCOUNTER — Encounter: Payer: Self-pay | Admitting: Hematology

## 2023-10-24 ENCOUNTER — Other Ambulatory Visit: Payer: Self-pay

## 2023-10-24 ENCOUNTER — Inpatient Hospital Stay: Payer: Medicare Other | Attending: Hematology

## 2023-10-24 VITALS — BP 130/59 | HR 61 | Temp 98.8°F | Resp 16

## 2023-10-24 DIAGNOSIS — Z452 Encounter for adjustment and management of vascular access device: Secondary | ICD-10-CM | POA: Diagnosis not present

## 2023-10-24 DIAGNOSIS — Q2733 Arteriovenous malformation of digestive system vessel: Secondary | ICD-10-CM | POA: Diagnosis present

## 2023-10-24 DIAGNOSIS — D500 Iron deficiency anemia secondary to blood loss (chronic): Secondary | ICD-10-CM | POA: Insufficient documentation

## 2023-10-24 DIAGNOSIS — K5521 Angiodysplasia of colon with hemorrhage: Secondary | ICD-10-CM | POA: Diagnosis present

## 2023-10-24 DIAGNOSIS — K5520 Angiodysplasia of colon without hemorrhage: Secondary | ICD-10-CM

## 2023-10-24 LAB — CMP (CANCER CENTER ONLY)
ALT: 182 U/L — ABNORMAL HIGH (ref 0–44)
AST: 183 U/L (ref 15–41)
Albumin: 4 g/dL (ref 3.5–5.0)
Alkaline Phosphatase: 246 U/L — ABNORMAL HIGH (ref 38–126)
Anion gap: 5 (ref 5–15)
BUN: 24 mg/dL — ABNORMAL HIGH (ref 8–23)
CO2: 26 mmol/L (ref 22–32)
Calcium: 10.4 mg/dL — ABNORMAL HIGH (ref 8.9–10.3)
Chloride: 105 mmol/L (ref 98–111)
Creatinine: 1.34 mg/dL — ABNORMAL HIGH (ref 0.44–1.00)
GFR, Estimated: 42 mL/min — ABNORMAL LOW
Glucose, Bld: 185 mg/dL — ABNORMAL HIGH (ref 70–99)
Potassium: 4.7 mmol/L (ref 3.5–5.1)
Sodium: 136 mmol/L (ref 135–145)
Total Bilirubin: 0.3 mg/dL (ref 0.0–1.2)
Total Protein: 7.1 g/dL (ref 6.5–8.1)

## 2023-10-24 LAB — CBC WITH DIFFERENTIAL (CANCER CENTER ONLY)
Abs Immature Granulocytes: 0.03 10*3/uL (ref 0.00–0.07)
Basophils Absolute: 0 10*3/uL (ref 0.0–0.1)
Basophils Relative: 1 %
Eosinophils Absolute: 0.2 10*3/uL (ref 0.0–0.5)
Eosinophils Relative: 2 %
HCT: 34.5 % — ABNORMAL LOW (ref 36.0–46.0)
Hemoglobin: 10.1 g/dL — ABNORMAL LOW (ref 12.0–15.0)
Immature Granulocytes: 0 %
Lymphocytes Relative: 18 %
Lymphs Abs: 1.3 10*3/uL (ref 0.7–4.0)
MCH: 19.8 pg — ABNORMAL LOW (ref 26.0–34.0)
MCHC: 29.3 g/dL — ABNORMAL LOW (ref 30.0–36.0)
MCV: 67.6 fL — ABNORMAL LOW (ref 80.0–100.0)
Monocytes Absolute: 0.6 10*3/uL (ref 0.1–1.0)
Monocytes Relative: 8 %
Neutro Abs: 5.4 10*3/uL (ref 1.7–7.7)
Neutrophils Relative %: 71 %
Platelet Count: 261 10*3/uL (ref 150–400)
RBC: 5.1 MIL/uL (ref 3.87–5.11)
RDW: 20.8 % — ABNORMAL HIGH (ref 11.5–15.5)
WBC Count: 7.5 10*3/uL (ref 4.0–10.5)
nRBC: 0 % (ref 0.0–0.2)

## 2023-10-24 MED ORDER — SODIUM CHLORIDE 0.9% FLUSH
10.0000 mL | Freq: Once | INTRAVENOUS | Status: AC
  Administered 2023-10-24: 10 mL

## 2023-10-24 MED ORDER — HEPARIN SOD (PORK) LOCK FLUSH 100 UNIT/ML IV SOLN
500.0000 [IU] | Freq: Once | INTRAVENOUS | Status: AC
  Administered 2023-10-24: 500 [IU]

## 2023-10-24 NOTE — Telephone Encounter (Signed)
 Spoke with pt via telephone regarding lab results.  Informed pt that her AST is elevated and Lenor Raddle, NP would like for the pt to f/u with her Gastroenterologist.  Pt stated she has a f/u appt scheduled on 11/05/2023 with Dr. Modesto Andreas at [facility].  Pt denied any changes in her medications or supplements.  Pt did stated she started drinking Glucerna in April 2025 but no new medications.  Pt denied drinking alcohol  recently.  Stated this nurse will fax Dr Modesto Andreas pt's recent lab results.  Pt verbalized understanding and had no further questions at this time.  Faxed labs to Dr. Christella Coventry office.  Fax confirmation received.

## 2023-10-24 NOTE — Telephone Encounter (Signed)
 Bethany [name] from the lab calling to report a critical: AST 183 ALT: 182 T Billi: 0.6 Made Rande Bushy Aware, NP

## 2023-11-11 ENCOUNTER — Encounter (HOSPITAL_COMMUNITY): Payer: Self-pay | Admitting: Interventional Radiology

## 2023-11-23 ENCOUNTER — Encounter: Payer: Self-pay | Admitting: Hematology

## 2023-12-10 ENCOUNTER — Other Ambulatory Visit: Payer: Self-pay | Admitting: Hematology

## 2023-12-10 DIAGNOSIS — Z95828 Presence of other vascular implants and grafts: Secondary | ICD-10-CM

## 2023-12-19 ENCOUNTER — Inpatient Hospital Stay: Payer: Medicare Other | Attending: Hematology

## 2023-12-19 ENCOUNTER — Inpatient Hospital Stay (HOSPITAL_BASED_OUTPATIENT_CLINIC_OR_DEPARTMENT_OTHER): Payer: Medicare Other | Admitting: Hematology

## 2023-12-19 VITALS — BP 118/50 | HR 70 | Temp 98.0°F | Resp 16 | Wt 178.9 lb

## 2023-12-19 DIAGNOSIS — D500 Iron deficiency anemia secondary to blood loss (chronic): Secondary | ICD-10-CM | POA: Diagnosis present

## 2023-12-19 DIAGNOSIS — K5521 Angiodysplasia of colon with hemorrhage: Secondary | ICD-10-CM | POA: Diagnosis present

## 2023-12-19 DIAGNOSIS — Z87891 Personal history of nicotine dependence: Secondary | ICD-10-CM | POA: Diagnosis not present

## 2023-12-19 DIAGNOSIS — K5520 Angiodysplasia of colon without hemorrhage: Secondary | ICD-10-CM

## 2023-12-19 LAB — CBC WITH DIFFERENTIAL/PLATELET
Abs Immature Granulocytes: 0.03 K/uL (ref 0.00–0.07)
Basophils Absolute: 0 K/uL (ref 0.0–0.1)
Basophils Relative: 0 %
Eosinophils Absolute: 0.2 K/uL (ref 0.0–0.5)
Eosinophils Relative: 2 %
HCT: 34.4 % — ABNORMAL LOW (ref 36.0–46.0)
Hemoglobin: 10.1 g/dL — ABNORMAL LOW (ref 12.0–15.0)
Immature Granulocytes: 0 %
Lymphocytes Relative: 16 %
Lymphs Abs: 1.5 K/uL (ref 0.7–4.0)
MCH: 20.2 pg — ABNORMAL LOW (ref 26.0–34.0)
MCHC: 29.4 g/dL — ABNORMAL LOW (ref 30.0–36.0)
MCV: 68.7 fL — ABNORMAL LOW (ref 80.0–100.0)
Monocytes Absolute: 0.7 K/uL (ref 0.1–1.0)
Monocytes Relative: 8 %
Neutro Abs: 6.7 K/uL (ref 1.7–7.7)
Neutrophils Relative %: 74 %
Platelets: 245 K/uL (ref 150–400)
RBC: 5.01 MIL/uL (ref 3.87–5.11)
RDW: 23.3 % — ABNORMAL HIGH (ref 11.5–15.5)
WBC: 9.1 K/uL (ref 4.0–10.5)
nRBC: 0 % (ref 0.0–0.2)

## 2023-12-19 LAB — FERRITIN: Ferritin: 23 ng/mL (ref 11–307)

## 2023-12-19 MED ORDER — SODIUM CHLORIDE 0.9% FLUSH
10.0000 mL | Freq: Once | INTRAVENOUS | Status: AC
Start: 2023-12-19 — End: 2023-12-19
  Administered 2023-12-19: 10 mL

## 2023-12-19 NOTE — Assessment & Plan Note (Signed)
--  Pt has intestinal AVM history with associated chronic GI blood loss resulting in iron deficiency and blood loss anemia. She has required blood transfusions in the past in 2013 and in 2018 and frequent hospital admission for anemia. She does not meet the diagnosis criteria for hereditary hemorrhagic telangiectasia. -Her last colonoscopy/endoscopy was 02/2019, recall 2025.  -She is not on oral iron, due to the lack of benefit.  -Currently being treated with IV Iron to prevent significant anemia with goal of Ferritin 100-200 range 

## 2023-12-19 NOTE — Progress Notes (Signed)
 [facility]   [phone] [phone]   Clinic Follow up Note   Patient Care Team: Leontine Cramp, NP as PCP - General (Nurse Practitioner) Barbaraann, Darryle Ned, MD as PCP - Cardiology (Cardiology) Lanny Callander, MD as Consulting Physician (Hematology) Eliza Lonni RAMAN, MD (Inactive) as Consulting Physician (Vascular Surgery) Tamela Maudlin, MD as Referring Physician (Gastroenterology)  Date of Service:  12/19/2023  CHIEF COMPLAINT: f/u of iron  deficient anemia  CURRENT THERAPY:  IV iron  as needed if ferritin less than 20  Oncology History   Iron  deficiency anemia secondary to blood loss (chronic) -Pt has intestinal AVM history with associated chronic GI blood loss resulting in iron  deficiency and blood loss anemia. She has required blood transfusions in the past in 2013 and in 2018 and frequent hospital admission for anemia. She does not meet the diagnosis criteria for hereditary hemorrhagic telangiectasia. -Her last colonoscopy/endoscopy was 02/2019, recall 2025.  -She is not on oral iron , due to the lack of benefit.  -Currently being treated with IV Iron  to prevent significant anemia with goal of Ferritin 100-200 range    Assessment & Plan Iron  deficiency anemia Iron  deficiency anemia with well-managed hemoglobin level at 10.1 g/dL, consistent with previous measurements. Possible gastrointestinal bleeding as a contributing factor, with adenomatous polyps removed during colonoscopy in May 2025. Awaiting ferritin levels to assess iron  stores. - Monitor blood counts every 2-3 months. - Order ferritin level to assess iron  stores. - Administer IV iron  if ferritin level is low. - Schedule follow-up appointment in 6 months.     Discussed the use of AI scribe software for clinical note transcription with the patient, who gave verbal consent to proceed.  History of Present Illness Bethany [name] is a 73 year old female with anemia who presents for  follow-up.  She experiences fatigue and low energy, which she attributes to low iron  levels. Blood in her stool was previously attributed to a hernia. A colonoscopy in May 2025 revealed four adenomatous polyps, which were removed. No cancer was present.  She has abnormal liver function tests and fatty liver disease, monitored by her gastroenterologist. An abdominal ultrasound in 2024 showed no liver cirrhosis.  She has multiple lung nodules and follows up with her pulmonologist. She quit smoking and last saw her pulmonologist in January 2025.  She has not required frequent iron  infusions, with the last infusion occurring in December 2024 and January 2025.     All other systems were reviewed with the patient and are negative.  MEDICAL HISTORY:  Past Medical History:  Diagnosis Date   Acute renal failure (HCC)    Anemia    Arthritis    COPD (chronic obstructive pulmonary disease) (HCC)    Diabetes mellitus without complication (HCC)    type II    GERD (gastroesophageal reflux disease)    Hypercalcemia    Hyperlipidemia    Hypertension    Pneumonia    Renal disorder    Single kidney    Stroke [facility])    July 2021   Thrombosis    Tobacco abuse     SURGICAL HISTORY: Past Surgical History:  Procedure Laterality Date   ABDOMINAL HYSTERECTOMY     blood clots removed from descending aorta      BREAST BIOPSY Right 07/28/2021   BREAST BIOPSY Right 08/17/2021   BREAST EXCISIONAL BIOPSY Right 01/25/2021   BREAST LUMPECTOMY WITH RADIOACTIVE SEED LOCALIZATION Right 01/25/2021   Procedure: RIGHT BREAST LUMPECTOMY WITH RADIOACTIVE SEED LOCALIZATION;  Surgeon:  Vernetta Berg, MD;  Location: [facility];  Service: General;  Laterality: Right;   CHOLECYSTECTOMY     COLONOSCOPY     COLONOSCOPY WITH PROPOFOL  N/A 04/17/2017   Procedure: COLONOSCOPY WITH PROPOFOL ;  Surgeon: Dianna Specking, MD;  Location: [facility];  Service: Endoscopy;  Laterality: N/A;    ESOPHAGOGASTRODUODENOSCOPY (EGD) WITH PROPOFOL  N/A 04/17/2017   Procedure: ESOPHAGOGASTRODUODENOSCOPY (EGD) WITH PROPOFOL ;  Surgeon: Dianna Specking, MD;  Location: [facility];  Service: Endoscopy;  Laterality: N/A;   IR 3D INDEPENDENT WKST  05/04/2020   IR ANGIO INTRA EXTRACRAN SEL COM CAROTID INNOMINATE BILAT MOD SED  02/06/2020   IR ANGIO INTRA EXTRACRAN SEL INTERNAL CAROTID UNI L MOD SED  05/04/2020   IR ANGIO VERTEBRAL SEL VERTEBRAL BILAT MOD SED  02/06/2020   IR ANGIOGRAM FOLLOW UP STUDY  05/04/2020   IR IMAGING GUIDED PORT INSERTION  05/01/2019   IR NEURO EACH ADD'L AFTER BASIC UNI LEFT (MS)  05/04/2020   IR RADIOLOGIST EVAL & MGMT  05/28/2020   IR TRANSCATH/EMBOLIZ  05/04/2020   IR US  GUIDE VASC ACCESS RIGHT  02/06/2020   IR US  GUIDE VASC ACCESS RIGHT  05/04/2020   LESION REMOVAL Left 01/25/2021   Procedure: ABNORMAL SKIN LESION REMOVAL LEFT ABDOMINAL WALL;  Surgeon: Vernetta Berg, MD;  Location: [facility];  Service: General;  Laterality: Left;   NEPHRECTOMY RECIPIENT     ORIF ANKLE FRACTURE N/A 07/13/2021   Procedure: OPEN REDUCTION INTERNAL FIXATION (ORIF) ANKLE FRACTURE;  Surgeon: Doll Skates, MD;  Location: [facility];  Service: Orthopedics;  Laterality: N/A;   RADIOLOGY WITH ANESTHESIA N/A 05/04/2020   Procedure: CONLEY;  Surgeon: Dolphus Carrion, MD;  Location: [facility];  Service: Radiology;  Laterality: N/A;    I have reviewed the social history and family history with the patient and they are unchanged from previous note.  ALLERGIES:  is allergic to contrast media [iodinated contrast media] and other.  MEDICATIONS:  Current Outpatient Medications  Medication Sig Dispense Refill   albuterol  (VENTOLIN  HFA) 108 (90 Base) MCG/ACT inhaler Inhale 1-2 puffs into the lungs every 4 (four) hours as needed for wheezing or shortness of breath.     allopurinol  (ZYLOPRIM ) 100 MG tablet Take 100 mg by mouth at bedtime.     amLODipine  (NORVASC ) 10 MG  tablet Take 10 mg by mouth daily.     atorvastatin  (LIPITOR ) 40 MG tablet Take 40 mg by mouth daily.     atorvastatin  (LIPITOR ) 80 MG tablet Take 80 mg by mouth daily.     docusate sodium  (COLACE) 100 MG capsule Take 100 mg by mouth at bedtime.     ezetimibe  (ZETIA ) 10 MG tablet Take 10 mg by mouth daily.     famotidine  (PEPCID ) 20 MG tablet Take 20 mg by mouth 2 (two) times daily.     furosemide  (LASIX ) 40 MG tablet Take 40 mg by mouth daily as needed for fluid or edema.     gabapentin  (NEURONTIN ) 100 MG capsule Take 2 capsules (200 mg total) by mouth 3 (three) times daily.     insulin  glargine, 1 Unit Dial, (TOUJEO  SOLOSTAR) 300 UNIT/ML Solostar Pen Inject 20 Units into the skin at bedtime.      insulin  lispro (HUMALOG) 100 UNIT/ML KwikPen Inject 5 Units into the skin 3 (three) times daily as needed (blood sugar >200).     Ipratropium-Albuterol  (COMBIVENT ) 20-100 MCG/ACT AERS respimat Inhale 1 puff into the lungs every 6 (six) hours as needed for wheezing or shortness  of breath. 1 Inhaler 0   JARDIANCE 25 MG TABS tablet Take 25 mg by mouth every morning.     levothyroxine  (SYNTHROID ) 75 MCG tablet Take 75 mcg by mouth daily.     lidocaine -prilocaine  (EMLA ) cream APPLY 2 TO 5 CENTIMETERS TO PORT SITE 45 MINUTES TO 1 HOUR PRIOR TO ACCESS 30 g 11   losartan  (COZAAR ) 25 MG tablet Take 1 tablet (25 mg total) by mouth daily. 30 tablet 11   magnesium  oxide (MAG-OX) 400 MG tablet Take 1 tablet by mouth daily.     metoprolol  (LOPRESSOR ) 50 MG tablet Take 50 mg by mouth 2 (two) times daily.      NARCAN  4 MG/0.1ML LIQD nasal spray kit Place 1 spray into the nose as needed for opioid reversal.     Omega-3 Fatty Acids (FISH OIL PO) Take 1 capsule by mouth 2 (two) times daily.     Omega-3 Fatty Acids (FISH OIL) 1000 MG CAPS Take 1 capsule by mouth 2 (two) times daily.     oxyCODONE -acetaminophen  (PERCOCET) 10-325 MG tablet Take 1 tablet by mouth every 4 (four) hours as needed.     OZEMPIC, 0.25 OR 0.5  MG/DOSE, 2 MG/3ML SOPN Inject 0.5 mg into the skin once a week.     OZEMPIC, 1 MG/DOSE, 4 MG/3ML SOPN Inject 1 mg into the skin once a week.     pantoprazole  (PROTONIX ) 40 MG tablet Take 1 tablet (40 mg total) by mouth 2 (two) times daily before a meal. 60 tablet 0   polyethylene glycol powder (MIRALAX ) 17 GM/SCOOP powder Take 17 g by mouth 2 (two) times daily as needed for moderate constipation. 255 g 0   senna-docusate (SENOKOT-S) 8.6-50 MG tablet Take 1 tablet by mouth 2 (two) times daily between meals as needed for mild constipation. 60 tablet 0   Tetrahydrozoline HCl (VISINE OP) Place 1 drop into both eyes 3 (three) times daily as needed (dry eyes).     No current facility-administered medications for this visit.    PHYSICAL EXAMINATION: ECOG PERFORMANCE STATUS: 1 - Symptomatic but completely ambulatory  Vitals:   12/19/23 1144  BP: (!) 118/50  Pulse: 70  Resp: 16  Temp: 98 F (36.7 C)  SpO2: 94%   Wt Readings from Last 3 Encounters:  12/19/23 178 lb 14.4 oz (81.1 kg)  07/04/23 179 lb 1.6 oz (81.2 kg)  06/15/23 176 lb (79.8 kg)     GENERAL:alert, no distress and comfortable SKIN: skin color, texture, turgor are normal, no rashes or significant lesions EYES: normal, Conjunctiva are pink and non-injected, sclera clear NECK: supple, thyroid  normal size, non-tender, without nodularity LYMPH:  no palpable lymphadenopathy in the cervical, axillary  LUNGS: clear to auscultation and percussion with normal breathing effort HEART: regular rate & rhythm and no murmurs and no lower extremity edema ABDOMEN:abdomen soft, non-tender and normal bowel sounds Musculoskeletal:no cyanosis of digits and no clubbing  NEURO: alert & oriented x 3 with fluent speech, no focal motor/sensory deficits  Physical Exam    LABORATORY DATA:  I have reviewed the data as listed    Latest Ref Rng & Units 12/19/2023   11:23 AM 10/24/2023    1:10 PM 08/29/2023    1:32 PM  CBC  WBC 4.0 - 10.5 K/uL 9.1   7.5  8.7   Hemoglobin 12.0 - 15.0 g/dL 89.8  89.8  9.5   Hematocrit 36.0 - 46.0 % 34.4  34.5  32.6   Platelets 150 - 400 K/uL 245  261  235         Latest Ref Rng & Units 10/24/2023    1:10 PM 08/29/2023    1:32 PM 07/04/2023   12:47 PM  CMP  Glucose 70 - 99 mg/dL 814  773  768   BUN 8 - 23 mg/dL 24  23  23    Creatinine 0.44 - 1.00 mg/dL 8.65  8.65  8.82   Sodium 135 - 145 mmol/L 136  137  138   Potassium 3.5 - 5.1 mmol/L 4.7  4.7  4.0   Chloride 98 - 111 mmol/L 105  105  108   CO2 22 - 32 mmol/L 26  27  25    Calcium  8.9 - 10.3 mg/dL 89.5  89.9  9.6   Total Protein 6.5 - 8.1 g/dL 7.1  7.0  6.3   Total Bilirubin 0.0 - 1.2 mg/dL 0.3  0.3  0.3   Alkaline Phos 38 - 126 U/L 246  167  194   AST 15 - 41 U/L 183  30  37   ALT 0 - 44 U/L 182  30  42       RADIOGRAPHIC STUDIES: I have personally reviewed the radiological images as listed and agreed with the findings in the report. No results found.    Orders Placed This Encounter  Procedures   CBC with Differential/Platelet    Standing Status:   Standing    Number of Occurrences:   50    Expiration Date:   12/18/2024   Ferritin    Standing Status:   Standing    Number of Occurrences:   20    Expiration Date:   12/18/2024   All questions were answered. The patient knows to call the clinic with any problems, questions or concerns. No barriers to learning was detected. The total time spent in the appointment was 15 minutes, including review of chart and various tests results, discussions about plan of care and coordination of care plan     Onita Mattock, MD 12/19/2023

## 2024-02-20 ENCOUNTER — Inpatient Hospital Stay

## 2024-02-27 ENCOUNTER — Other Ambulatory Visit: Payer: Self-pay

## 2024-04-08 ENCOUNTER — Telehealth: Payer: Self-pay

## 2024-04-08 NOTE — Telephone Encounter (Signed)
 Pt called stated that she's called several times regarding her new cellphone number and appt schedule.  Pt stated that she was supposed to be scheduled for port flush w/labs in October or November per last OV with Dr Lanny but was scheduled incorrectly.  Corrected pt's appts while on the phone.  Rescheduled Feb 2026 OV to Dec so pt could get back on track with correct scheduling.  Pt confirmed all appts.

## 2024-04-23 ENCOUNTER — Inpatient Hospital Stay

## 2024-04-25 NOTE — Assessment & Plan Note (Signed)
--  Pt has intestinal AVM history with associated chronic GI blood loss resulting in iron deficiency and blood loss anemia. She has required blood transfusions in the past in 2013 and in 2018 and frequent hospital admission for anemia. She does not meet the diagnosis criteria for hereditary hemorrhagic telangiectasia. -Her last colonoscopy/endoscopy was 02/2019, recall 2025.  -She is not on oral iron, due to the lack of benefit.  -Currently being treated with IV Iron to prevent significant anemia with goal of Ferritin 100-200 range 

## 2024-04-26 ENCOUNTER — Telehealth: Payer: Self-pay

## 2024-04-26 ENCOUNTER — Ambulatory Visit (HOSPITAL_COMMUNITY)
Admission: RE | Admit: 2024-04-26 | Discharge: 2024-04-26 | Disposition: A | Source: Ambulatory Visit | Attending: Hematology

## 2024-04-26 ENCOUNTER — Inpatient Hospital Stay: Attending: Hematology | Admitting: Hematology

## 2024-04-26 ENCOUNTER — Other Ambulatory Visit: Payer: Self-pay

## 2024-04-26 ENCOUNTER — Inpatient Hospital Stay

## 2024-04-26 VITALS — BP 116/57 | HR 67 | Temp 97.3°F | Resp 17 | Wt 169.3 lb

## 2024-04-26 DIAGNOSIS — R051 Acute cough: Secondary | ICD-10-CM

## 2024-04-26 DIAGNOSIS — K5521 Angiodysplasia of colon with hemorrhage: Secondary | ICD-10-CM | POA: Insufficient documentation

## 2024-04-26 DIAGNOSIS — D500 Iron deficiency anemia secondary to blood loss (chronic): Secondary | ICD-10-CM

## 2024-04-26 LAB — CBC WITH DIFFERENTIAL/PLATELET
Abs Immature Granulocytes: 0.02 K/uL (ref 0.00–0.07)
Basophils Absolute: 0 K/uL (ref 0.0–0.1)
Basophils Relative: 0 %
Eosinophils Absolute: 0.2 K/uL (ref 0.0–0.5)
Eosinophils Relative: 2 %
HCT: 36.9 % (ref 36.0–46.0)
Hemoglobin: 11.2 g/dL — ABNORMAL LOW (ref 12.0–15.0)
Immature Granulocytes: 0 %
Lymphocytes Relative: 17 %
Lymphs Abs: 1.2 K/uL (ref 0.7–4.0)
MCH: 22.1 pg — ABNORMAL LOW (ref 26.0–34.0)
MCHC: 30.4 g/dL (ref 30.0–36.0)
MCV: 72.9 fL — ABNORMAL LOW (ref 80.0–100.0)
Monocytes Absolute: 0.6 K/uL (ref 0.1–1.0)
Monocytes Relative: 8 %
Neutro Abs: 4.9 K/uL (ref 1.7–7.7)
Neutrophils Relative %: 73 %
Platelets: 233 K/uL (ref 150–400)
RBC: 5.06 MIL/uL (ref 3.87–5.11)
RDW: 20.7 % — ABNORMAL HIGH (ref 11.5–15.5)
WBC: 6.8 K/uL (ref 4.0–10.5)
nRBC: 0 % (ref 0.0–0.2)

## 2024-04-26 LAB — FERRITIN: Ferritin: 46 ng/mL (ref 11–307)

## 2024-04-26 NOTE — Telephone Encounter (Signed)
 Made Dr. Lanny aware the chest xray results were back and received the below message to call patient and relay the results. Called patient made her aware she had no further questions at this time.    Could you let her know her CXR shows no signs of infection, let her check COVID and flu at home as I told her, and take OTC meds for her cough, thanks

## 2024-04-26 NOTE — Progress Notes (Signed)
 [facility]   [phone] [phone]   Clinic Follow up Note   Patient Care Team: Leontine Cramp, NP as PCP - General (Nurse Practitioner) Barbaraann, Darryle Ned, MD as PCP - Cardiology (Cardiology) Lanny Callander, MD as Consulting Physician (Hematology) Eliza Lonni RAMAN, MD (Inactive) as Consulting Physician (Vascular Surgery) Tamela Maudlin, MD as Referring Physician (Gastroenterology)  Date of Service:  04/26/2024  CHIEF COMPLAINT: f/u of iron  deficient anemia  CURRENT THERAPY:  IV iron  as needed  Oncology History   Iron  deficiency anemia secondary to blood loss (chronic) -Pt has intestinal AVM history with associated chronic GI blood loss resulting in iron  deficiency and blood loss anemia. She has required blood transfusions in the past in 2013 and in 2018 and frequent hospital admission for anemia. She does not meet the diagnosis criteria for hereditary hemorrhagic telangiectasia. -Her last colonoscopy/endoscopy was 02/2019, recall 2025.  -She is not on oral iron , due to the lack of benefit.  -Currently being treated with IV Iron  to prevent significant anemia with goal of Ferritin 100-200 range   Assessment & Plan Iron  deficiency anemia Chronic iron  deficiency anemia with improved hemoglobin compared to prior values. Requires ongoing monitoring due to persistent iron  deficiency and difficulty with peripheral blood draws. Laboratory results from October obtained by her primary care provider are unavailable for review. - Ordered repeat laboratory studies to assess current iron  indices. - Scheduled laboratory monitoring and port flush every two months. - Scheduled follow-up visit in six months.  Acute cough Subacute, severe, productive cough for approximately three weeks with clear to white frothy sputum. No fever, but reports hot flashes and significant nasal drainage. Smoker with recent reduction in tobacco use. Pulmonary examination  unremarkable and normal leukocyte count, making bacterial infection less likely. Chest CT scheduled in January for lung nodule surveillance. - Ordered chest radiograph to evaluate cough. - Advised home COVID-19 and influenza testing with pharmacy kits. - Discussed over-the-counter cough and allergy medications; she reported intolerance to guaifenesin  and benefit from mullein tea. - Reinforced smoking cessation.  Plan - Lab reviewed, hemoglobin 11.2, ferritin 46, no need IV iron  for now. - Lab every 2 months, IV iron  if ferritin less than 20 - Follow-up in 6 months - I ordered a chest x-ray today, which was negative.  Resolved inform patient, no need antibiotics for now.  I suggest her to test COVID and influenza at home.    Discussed the use of AI scribe software for clinical note transcription with the patient, who gave verbal consent to proceed.  History of Present Illness Bethany [name] is a 73 year old female with chronic iron  deficiency anemia who presents for follow-up of anemia and evaluation of persistent cough.  She has had a persistent, severe cough for 2-3 weeks, starting the Friday after Thanksgiving. The cough is productive of clear to white frothy sputum and can be intense enough to cause emesis. She denies hemoptysis, fever, or sore throat. She has frequent nasal drainage without congestion. She is up to date on COVID-19 and influenza vaccines. She recently started mullein tea, which she feels improves the cough, especially at night. Guaifenesin  previously worsened her congestion.  She is followed for chronic iron  deficiency anemia and believes her iron  is low. She had labs drawn in October with her primary care provider, but results are not available to hematology. She has not received recent iron  infusions. The last known hemoglobin was 11.2 g/dL. She uses a vascular access port for blood draws due to  poor peripheral access and reports confusion about port flush scheduling,  though it was flushed today.  She is under surveillance with another provider for lung nodules and has a chest CT scheduled for January 6 with a follow-up visit. She continues to smoke but has recently reduced tobacco use and feels smoking contributes to her cough.     All other systems were reviewed with the patient and are negative.  MEDICAL HISTORY:  Past Medical History:  Diagnosis Date   Acute renal failure    Anemia    Arthritis    COPD (chronic obstructive pulmonary disease) (HCC)    Diabetes mellitus without complication (HCC)    type II    GERD (gastroesophageal reflux disease)    Hypercalcemia    Hyperlipidemia    Hypertension    Pneumonia    Renal disorder    Single kidney    Stroke [facility])    July 2021   Thrombosis    Tobacco abuse     SURGICAL HISTORY: Past Surgical History:  Procedure Laterality Date   ABDOMINAL HYSTERECTOMY     blood clots removed from descending aorta      BREAST BIOPSY Right 07/28/2021   BREAST BIOPSY Right 08/17/2021   BREAST EXCISIONAL BIOPSY Right 01/25/2021   BREAST LUMPECTOMY WITH RADIOACTIVE SEED LOCALIZATION Right 01/25/2021   Procedure: RIGHT BREAST LUMPECTOMY WITH RADIOACTIVE SEED LOCALIZATION;  Surgeon: Vernetta Berg, MD;  Location: [facility];  Service: General;  Laterality: Right;   CHOLECYSTECTOMY     COLONOSCOPY     COLONOSCOPY WITH PROPOFOL  N/A 04/17/2017   Procedure: COLONOSCOPY WITH PROPOFOL ;  Surgeon: Dianna Specking, MD;  Location: [facility];  Service: Endoscopy;  Laterality: N/A;   ESOPHAGOGASTRODUODENOSCOPY (EGD) WITH PROPOFOL  N/A 04/17/2017   Procedure: ESOPHAGOGASTRODUODENOSCOPY (EGD) WITH PROPOFOL ;  Surgeon: Dianna Specking, MD;  Location: [facility];  Service: Endoscopy;  Laterality: N/A;   IR 3D INDEPENDENT WKST  05/04/2020   IR ANGIO INTRA EXTRACRAN SEL COM CAROTID INNOMINATE BILAT MOD SED  02/06/2020   IR ANGIO INTRA EXTRACRAN SEL INTERNAL CAROTID UNI L MOD SED  05/04/2020   IR  ANGIO VERTEBRAL SEL VERTEBRAL BILAT MOD SED  02/06/2020   IR ANGIOGRAM FOLLOW UP STUDY  05/04/2020   IR IMAGING GUIDED PORT INSERTION  05/01/2019   IR NEURO EACH ADD'L AFTER BASIC UNI LEFT (MS)  05/04/2020   IR RADIOLOGIST EVAL & MGMT  05/28/2020   IR TRANSCATH/EMBOLIZ  05/04/2020   IR US  GUIDE VASC ACCESS RIGHT  02/06/2020   IR US  GUIDE VASC ACCESS RIGHT  05/04/2020   LESION REMOVAL Left 01/25/2021   Procedure: ABNORMAL SKIN LESION REMOVAL LEFT ABDOMINAL WALL;  Surgeon: Vernetta Berg, MD;  Location: [facility];  Service: General;  Laterality: Left;   NEPHRECTOMY RECIPIENT     ORIF ANKLE FRACTURE N/A 07/13/2021   Procedure: OPEN REDUCTION INTERNAL FIXATION (ORIF) ANKLE FRACTURE;  Surgeon: Doll Skates, MD;  Location: [facility];  Service: Orthopedics;  Laterality: N/A;   RADIOLOGY WITH ANESTHESIA N/A 05/04/2020   Procedure: CONLEY;  Surgeon: Dolphus Carrion, MD;  Location: [facility];  Service: Radiology;  Laterality: N/A;    I have reviewed the social history and family history with the patient and they are unchanged from previous note.  ALLERGIES:  is allergic to contrast media [iodinated contrast media] and other.  MEDICATIONS:  Current Outpatient Medications  Medication Sig Dispense Refill   albuterol  (VENTOLIN  HFA) 108 (90 Base) MCG/ACT inhaler Inhale 1-2 puffs into the lungs every 4 (  four) hours as needed for wheezing or shortness of breath.     allopurinol  (ZYLOPRIM ) 100 MG tablet Take 100 mg by mouth at bedtime.     amLODipine  (NORVASC ) 10 MG tablet Take 10 mg by mouth daily.     atorvastatin  (LIPITOR ) 40 MG tablet Take 40 mg by mouth daily.     atorvastatin  (LIPITOR ) 80 MG tablet Take 80 mg by mouth daily.     docusate sodium  (COLACE) 100 MG capsule Take 100 mg by mouth at bedtime.     ezetimibe  (ZETIA ) 10 MG tablet Take 10 mg by mouth daily.     famotidine  (PEPCID ) 20 MG tablet Take 20 mg by mouth 2 (two) times daily.     furosemide  (LASIX ) 40 MG tablet  Take 40 mg by mouth daily as needed for fluid or edema.     gabapentin  (NEURONTIN ) 100 MG capsule Take 2 capsules (200 mg total) by mouth 3 (three) times daily.     insulin  glargine, 1 Unit Dial, (TOUJEO  SOLOSTAR) 300 UNIT/ML Solostar Pen Inject 20 Units into the skin at bedtime.      insulin  lispro (HUMALOG) 100 UNIT/ML KwikPen Inject 5 Units into the skin 3 (three) times daily as needed (blood sugar >200).     Ipratropium-Albuterol  (COMBIVENT ) 20-100 MCG/ACT AERS respimat Inhale 1 puff into the lungs every 6 (six) hours as needed for wheezing or shortness of breath. 1 Inhaler 0   JARDIANCE 25 MG TABS tablet Take 25 mg by mouth every morning.     levothyroxine  (SYNTHROID ) 75 MCG tablet Take 75 mcg by mouth daily.     lidocaine -prilocaine  (EMLA ) cream APPLY 2 TO 5 CENTIMETERS TO PORT SITE 45 MINUTES TO 1 HOUR PRIOR TO ACCESS 30 g 11   magnesium  oxide (MAG-OX) 400 MG tablet Take 1 tablet by mouth daily.     metoprolol  (LOPRESSOR ) 50 MG tablet Take 50 mg by mouth 2 (two) times daily.      NARCAN  4 MG/0.1ML LIQD nasal spray kit Place 1 spray into the nose as needed for opioid reversal.     Omega-3 Fatty Acids (FISH OIL PO) Take 1 capsule by mouth 2 (two) times daily.     Omega-3 Fatty Acids (FISH OIL) 1000 MG CAPS Take 1 capsule by mouth 2 (two) times daily.     oxyCODONE -acetaminophen  (PERCOCET) 10-325 MG tablet Take 1 tablet by mouth every 4 (four) hours as needed.     OZEMPIC, 0.25 OR 0.5 MG/DOSE, 2 MG/3ML SOPN Inject 0.5 mg into the skin once a week.     OZEMPIC, 1 MG/DOSE, 4 MG/3ML SOPN Inject 1 mg into the skin once a week.     pantoprazole  (PROTONIX ) 40 MG tablet Take 1 tablet (40 mg total) by mouth 2 (two) times daily before a meal. 60 tablet 0   polyethylene glycol powder (MIRALAX ) 17 GM/SCOOP powder Take 17 g by mouth 2 (two) times daily as needed for moderate constipation. 255 g 0   senna-docusate (SENOKOT-S) 8.6-50 MG tablet Take 1 tablet by mouth 2 (two) times daily between meals as  needed for mild constipation. 60 tablet 0   Tetrahydrozoline HCl (VISINE OP) Place 1 drop into both eyes 3 (three) times daily as needed (dry eyes).     losartan  (COZAAR ) 25 MG tablet Take 1 tablet (25 mg total) by mouth daily. (Patient not taking: Reported on 04/26/2024) 30 tablet 11   No current facility-administered medications for this visit.    PHYSICAL EXAMINATION: ECOG PERFORMANCE STATUS: 1 -  Symptomatic but completely ambulatory  Vitals:   04/26/24 1057  BP: (!) 116/57  Pulse: 67  Resp: 17  Temp: (!) 97.3 F (36.3 C)  SpO2: 97%   Wt Readings from Last 3 Encounters:  04/26/24 169 lb 4.8 oz (76.8 kg)  12/19/23 178 lb 14.4 oz (81.1 kg)  07/04/23 179 lb 1.6 oz (81.2 kg)     GENERAL:alert, no distress and comfortable SKIN: skin color, texture, turgor are normal, no rashes or significant lesions EYES: normal, Conjunctiva are pink and non-injected, sclera clear NECK: supple, thyroid  normal size, non-tender, without nodularity LYMPH:  no palpable lymphadenopathy in the cervical, axillary  LUNGS: clear to auscultation and percussion with normal breathing effort HEART: regular rate & rhythm and no murmurs and no lower extremity edema ABDOMEN:abdomen soft, non-tender and normal bowel sounds Musculoskeletal:no cyanosis of digits and no clubbing  NEURO: alert & oriented x 3 with fluent speech, no focal motor/sensory deficits  Physical Exam   LABORATORY DATA:  I have reviewed the data as listed    Latest Ref Rng & Units 04/26/2024   10:33 AM 12/19/2023   11:23 AM 10/24/2023    1:10 PM  CBC  WBC 4.0 - 10.5 K/uL 6.8  9.1  7.5   Hemoglobin 12.0 - 15.0 g/dL 88.7  89.8  89.8   Hematocrit 36.0 - 46.0 % 36.9  34.4  34.5   Platelets 150 - 400 K/uL 233  245  261         Latest Ref Rng & Units 10/24/2023    1:10 PM 08/29/2023    1:32 PM 07/04/2023   12:47 PM  CMP  Glucose 70 - 99 mg/dL 814  773  768   BUN 8 - 23 mg/dL 24  23  23    Creatinine 0.44 - 1.00 mg/dL 8.65  8.65  8.82    Sodium 135 - 145 mmol/L 136  137  138   Potassium 3.5 - 5.1 mmol/L 4.7  4.7  4.0   Chloride 98 - 111 mmol/L 105  105  108   CO2 22 - 32 mmol/L 26  27  25    Calcium  8.9 - 10.3 mg/dL 89.5  89.9  9.6   Total Protein 6.5 - 8.1 g/dL 7.1  7.0  6.3   Total Bilirubin 0.0 - 1.2 mg/dL 0.3  0.3  0.3   Alkaline Phos 38 - 126 U/L 246  167  194   AST 15 - 41 U/L 183  30  37   ALT 0 - 44 U/L 182  30  42       RADIOGRAPHIC STUDIES: I have personally reviewed the radiological images as listed and agreed with the findings in the report. DG Chest 2 View Result Date: 04/26/2024 CLINICAL DATA:  Productive cough. EXAM: CHEST - 2 VIEW COMPARISON:  10/08/2022 FINDINGS: Stable mild cardiomegaly. Right-sided power port remains in appropriate position. Both lungs are clear. IMPRESSION: Mild cardiomegaly. No active lung disease. Electronically Signed   By: Norleen DELENA Kil M.D.   On: 04/26/2024 12:46      Orders Placed This Encounter  Procedures   DG Chest 2 View    Standing Status:   Future    Number of Occurrences:   1    Expected Date:   04/26/2024    Expiration Date:   04/26/2025    Reason for Exam (SYMPTOM  OR DIAGNOSIS REQUIRED):   productive cough, rule out infection    Preferred imaging location?:   [facility]  Release to patient:   Immediate [1]   All questions were answered. The patient knows to call the clinic with any problems, questions or concerns. No barriers to learning was detected. The total time spent in the appointment was 25 minutes, including review of chart and various tests results, discussions about plan of care and coordination of care plan     Onita Mattock, MD 04/26/2024

## 2024-05-21 ENCOUNTER — Ambulatory Visit
Admission: RE | Admit: 2024-05-21 | Discharge: 2024-05-21 | Disposition: A | Source: Ambulatory Visit | Attending: Emergency Medicine | Admitting: Emergency Medicine

## 2024-05-21 DIAGNOSIS — R918 Other nonspecific abnormal finding of lung field: Secondary | ICD-10-CM

## 2024-05-31 ENCOUNTER — Ambulatory Visit: Payer: Self-pay | Admitting: Emergency Medicine

## 2024-06-03 NOTE — Telephone Encounter (Addendum)
" °  Spoke with patient VBU, OV scheduled   - NFN   X1 VM/ LM - return call  "

## 2024-06-25 ENCOUNTER — Inpatient Hospital Stay: Admitting: Hematology

## 2024-06-25 ENCOUNTER — Inpatient Hospital Stay: Attending: Hematology

## 2024-07-10 ENCOUNTER — Ambulatory Visit: Admitting: Emergency Medicine

## 2024-08-27 ENCOUNTER — Inpatient Hospital Stay

## 2024-10-29 ENCOUNTER — Inpatient Hospital Stay: Admitting: Hematology

## 2024-10-29 ENCOUNTER — Inpatient Hospital Stay
